# Patient Record
Sex: Female | Born: 1960 | Race: Black or African American | Hispanic: No | Marital: Married | Smoking: Never smoker
Health system: Southern US, Community
[De-identification: ages and names within clinical notes are randomized; demographics above are authoritative.]

## PROBLEM LIST (undated history)

## (undated) DIAGNOSIS — Z933 Colostomy status: Secondary | ICD-10-CM

## (undated) DIAGNOSIS — M5136 Other intervertebral disc degeneration, lumbar region: Secondary | ICD-10-CM

## (undated) DIAGNOSIS — R569 Unspecified convulsions: Secondary | ICD-10-CM

## (undated) DIAGNOSIS — G4733 Obstructive sleep apnea (adult) (pediatric): Secondary | ICD-10-CM

## (undated) DIAGNOSIS — K219 Gastro-esophageal reflux disease without esophagitis: Secondary | ICD-10-CM

## (undated) DIAGNOSIS — D8689 Sarcoidosis of other sites: Secondary | ICD-10-CM

## (undated) DIAGNOSIS — E119 Type 2 diabetes mellitus without complications: Secondary | ICD-10-CM

## (undated) DIAGNOSIS — M1990 Unspecified osteoarthritis, unspecified site: Secondary | ICD-10-CM

## (undated) DIAGNOSIS — R32 Unspecified urinary incontinence: Secondary | ICD-10-CM

## (undated) DIAGNOSIS — R7303 Prediabetes: Secondary | ICD-10-CM

## (undated) DIAGNOSIS — M51369 Other intervertebral disc degeneration, lumbar region without mention of lumbar back pain or lower extremity pain: Secondary | ICD-10-CM

## (undated) DIAGNOSIS — I10 Essential (primary) hypertension: Secondary | ICD-10-CM

## (undated) HISTORY — PX: ABDOMINAL HYSTERECTOMY: SHX81

## (undated) HISTORY — PX: BREAST SURGERY: SHX581

## (undated) HISTORY — DX: Prediabetes: R73.03

## (undated) HISTORY — DX: Essential (primary) hypertension: I10

## (undated) HISTORY — PX: BREAST EXCISIONAL BIOPSY: SUR124

---

## 1998-07-19 ENCOUNTER — Encounter: Payer: Self-pay | Admitting: General Surgery

## 1998-07-19 ENCOUNTER — Ambulatory Visit (HOSPITAL_COMMUNITY): Admission: RE | Admit: 1998-07-19 | Discharge: 1998-07-19 | Payer: Self-pay | Admitting: General Surgery

## 1999-04-04 ENCOUNTER — Other Ambulatory Visit: Admission: RE | Admit: 1999-04-04 | Discharge: 1999-04-04 | Payer: Self-pay | Admitting: Family Medicine

## 2000-06-29 ENCOUNTER — Encounter: Admission: RE | Admit: 2000-06-29 | Discharge: 2000-06-29 | Payer: Self-pay | Admitting: Family Medicine

## 2000-06-29 ENCOUNTER — Encounter: Payer: Self-pay | Admitting: Family Medicine

## 2000-07-02 ENCOUNTER — Encounter: Payer: Self-pay | Admitting: Family Medicine

## 2000-07-02 ENCOUNTER — Encounter: Admission: RE | Admit: 2000-07-02 | Discharge: 2000-07-02 | Payer: Self-pay | Admitting: Family Medicine

## 2000-10-01 ENCOUNTER — Encounter: Payer: Self-pay | Admitting: General Surgery

## 2000-10-01 ENCOUNTER — Ambulatory Visit (HOSPITAL_BASED_OUTPATIENT_CLINIC_OR_DEPARTMENT_OTHER): Admission: RE | Admit: 2000-10-01 | Discharge: 2000-10-01 | Payer: Self-pay | Admitting: General Surgery

## 2000-10-01 ENCOUNTER — Encounter (INDEPENDENT_AMBULATORY_CARE_PROVIDER_SITE_OTHER): Payer: Self-pay | Admitting: Specialist

## 2001-03-04 ENCOUNTER — Other Ambulatory Visit: Admission: RE | Admit: 2001-03-04 | Discharge: 2001-03-04 | Payer: Self-pay | Admitting: Family Medicine

## 2003-10-12 ENCOUNTER — Encounter: Admission: RE | Admit: 2003-10-12 | Discharge: 2003-10-12 | Payer: Self-pay | Admitting: Family Medicine

## 2006-02-23 ENCOUNTER — Encounter: Admission: RE | Admit: 2006-02-23 | Discharge: 2006-02-23 | Payer: Self-pay | Admitting: Family Medicine

## 2008-03-08 ENCOUNTER — Emergency Department (HOSPITAL_COMMUNITY): Admission: EM | Admit: 2008-03-08 | Discharge: 2008-03-08 | Payer: Self-pay | Admitting: Family Medicine

## 2008-04-12 ENCOUNTER — Encounter: Admission: RE | Admit: 2008-04-12 | Discharge: 2008-04-12 | Payer: Self-pay | Admitting: Family Medicine

## 2009-02-26 ENCOUNTER — Other Ambulatory Visit: Admission: RE | Admit: 2009-02-26 | Discharge: 2009-02-26 | Payer: Self-pay | Admitting: Family Medicine

## 2009-07-24 ENCOUNTER — Emergency Department (HOSPITAL_COMMUNITY): Admission: EM | Admit: 2009-07-24 | Discharge: 2009-07-24 | Payer: Self-pay | Admitting: Emergency Medicine

## 2010-03-07 ENCOUNTER — Encounter: Admission: RE | Admit: 2010-03-07 | Discharge: 2010-03-07 | Payer: Self-pay | Admitting: Family Medicine

## 2010-10-04 NOTE — Op Note (Signed)
[facility]  Patient:    Rhonda [name], Rhonda [name]                       MRN: [id number] Adm. Date:  [id number] Attending:  Arlis Porta CC:         Gretta Arab. Valentina Lucks, M.D.   Operative Report  PREOPERATIVE DIAGNOSIS:  Nonpalpable right breast mass.  POSTOPERATIVE DIAGNOSIS:  Nonpalpable right breast mass.  OPERATION:  Right breast biopsy after needle localization.  SURGEON:  Adolph Pollack, M.D.  ANESTHESIA:  General  INDICATION:  [name] is a 50 year old female who underwent a core needle biopsy of an abnormality in the right breast.  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  She underwent a mammogram this year and the lesion has grown. She now presents for localized excision of that lesion.  TECHNIQUE:  She underwent successful needle localization with a needle in the lateral aspect of the right breast.  She subsequently was brought to the operating room, placed supine on the operating table and given general anesthetic.  The right breast area was sterilely prepped and draped.  Plain 0.5% Marcaine was infiltrated in the lateral area and an incision was made laterally from 11 to 8 oclock.  Using sharp dissection and the cautery a cylindrical shaped mass of breast tissue was removed around the needle and sent for specimen mammogram.  The area of question was in the specimen mammogram.  The biopsy cavity was examined and the bleeding points controlled with the cautery.  Next, the subcutaneous fat was loosely approximated with an interrupted 3-0 Vicryl sutures.  The skin was closed with a 4-0 Monocryl subcuticular stitch, followed by Steri-Strips and sterile dressings.  She tolerated the procedure well without any apparent complications.  She was taken to the recovery room in satisfactory condition. DD:  10/01/00 TD:  10/02/00 Job: [id number]

## 2011-08-27 ENCOUNTER — Other Ambulatory Visit: Payer: Self-pay | Admitting: Family Medicine

## 2011-08-27 DIAGNOSIS — M545 Low back pain: Secondary | ICD-10-CM

## 2011-08-27 DIAGNOSIS — M5430 Sciatica, unspecified side: Secondary | ICD-10-CM

## 2011-08-30 ENCOUNTER — Ambulatory Visit
Admission: RE | Admit: 2011-08-30 | Discharge: 2011-08-30 | Disposition: A | Discharge status: Home / Self Care | Payer: BC Managed Care – PPO | Source: Ambulatory Visit | Attending: Family Medicine | Admitting: Family Medicine

## 2011-08-30 DIAGNOSIS — M5430 Sciatica, unspecified side: Secondary | ICD-10-CM

## 2011-08-30 DIAGNOSIS — M545 Low back pain: Secondary | ICD-10-CM

## 2011-08-30 MED ORDER — GADOBENATE DIMEGLUMINE 529 MG/ML IV SOLN
20.0000 mL | Freq: Once | INTRAVENOUS | Status: AC | PRN
  Administered 2011-08-30: 20 mL via INTRAVENOUS

## 2012-06-15 ENCOUNTER — Encounter (HOSPITAL_COMMUNITY): Payer: Self-pay

## 2012-06-15 ENCOUNTER — Emergency Department (HOSPITAL_COMMUNITY)
Admission: EM | Admit: 2012-06-15 | Discharge: 2012-06-15 | Disposition: A | Discharge status: Home / Self Care | Payer: Self-pay | Source: Home / Self Care | Attending: Family Medicine | Admitting: Family Medicine

## 2012-06-15 DIAGNOSIS — I10 Essential (primary) hypertension: Secondary | ICD-10-CM

## 2012-06-15 LAB — COMPREHENSIVE METABOLIC PANEL
ALT: 76 U/L — ABNORMAL HIGH (ref 0–35)
Albumin: 4 g/dL (ref 3.5–5.2)
Alkaline Phosphatase: 97 U/L (ref 39–117)
BUN: 10 mg/dL (ref 6–23)
GFR calc Af Amer: >90 mL/min (ref >90)
GFR calc non Af Amer: >90 mL/min (ref >90)
Glucose, Bld: 92 mg/dL (ref 70–99)
Potassium: 3.9 mEq/L (ref 3.5–5.1)
Sodium: 141 mEq/L (ref 135–145)
Total Bilirubin: 0.3 mg/dL (ref 0.3–1.2)

## 2012-06-15 LAB — LIPID PANEL
Cholesterol: 229 mg/dL — ABNORMAL HIGH (ref 0–200)
Triglycerides: 131 mg/dL (ref <150)

## 2012-06-15 MED ORDER — LISINOPRIL-HYDROCHLOROTHIAZIDE 10-12.5 MG PO TABS: 1.0000 | ORAL_TABLET | Freq: Every day | ORAL | Status: DC

## 2012-06-15 NOTE — ED Notes (Signed)
Authorization for release of records faxed to Dr. Doristine Locks @[phone]

## 2012-06-15 NOTE — ED Provider Notes (Signed)
History     CSN: [id number]  Arrival date & time 06/15/12  1038   First MD Initiated Contact with Patient 06/15/12 1050     Chief Complaint  Patient presents with  . Hypertension   HPI Pt reports that she has been out of her BP meds for many months.  She lost her job and insurance and has not been able to follow up.  She is trying to get a new job but was told that she had to get her BP under better control before she would be able to start her new job. She says that she has been having headaches but no visual changes.  Pt says that she was taking some OTC cough and cold medications over the last week.  She would like to go ahead and get medications for her BP and says that she will take them.    History reviewed. No pertinent past medical history.  Pt says she has history of sciatica.    Past Surgical History  Procedure Date  . Abdominal hysterectomy    No family history on file.  History  Substance Use Topics  . Smoking status: Never Smoker   . Smokeless tobacco: Not on file  . Alcohol Use: No    OB History    Grav Para Term Preterm Abortions TAB SAB Ect Mult Living                 Review of Systems  HENT: Positive for congestion.   Musculoskeletal: Positive for arthralgias.  All other systems reviewed and are negative.   Allergies  Aspirin and Augmentin  Home Medications  No current outpatient prescriptions on file.  BP 159/89  Pulse 87  Temp 98.6 F (37 C) (Oral)  Resp 16  SpO2 100%  Physical Exam  Nursing note and vitals reviewed. Constitutional: She is oriented to person, place, and time. She appears well-developed and well-nourished.  HENT:  Head: Normocephalic and atraumatic.  Eyes: Conjunctivae normal and EOM are normal. Pupils are equal, round, and reactive to light.  Neck: Normal range of motion. Neck supple.  Cardiovascular: Normal rate, regular rhythm and normal heart sounds.   Pulmonary/Chest: Effort normal and breath sounds normal.    Abdominal: Soft. Bowel sounds are normal.  Musculoskeletal: Normal range of motion.  Neurological: She is alert and oriented to person, place, and time. She has normal reflexes.  Skin: Skin is warm and dry.  Psychiatric: She has a normal mood and affect. Her behavior is normal. Judgment and thought content normal.   ED Course  Procedures (including critical care time)  Labs Reviewed - No data to display No results found.  No diagnosis found.  MDM  IMPRESSION  Hypertension, uncontrolled   RECOMMENDATIONS / PLAN Plan to start lisinopril HCTZ 10/12.5 mg po daily Check BP regularly at home and report home BP readings in 1-2 weeks  FOLLOW UP 1 month for BP check  The patient was given clear instructions to go to ER or return to medical center if symptoms don't improve, worsen or new problems develop.  The patient verbalized understanding.  The patient was told to call to get lab results if they haven't heard anything in the next week.            Cleora Fleet, MD 06/15/12 [phone]

## 2012-06-15 NOTE — ED Notes (Signed)
Patient states went for a physical today and was told her blood pressure was elevated (160/100) and was instructed to follow up here for medication refill

## 2012-06-16 NOTE — Progress Notes (Signed)
Quick Note:  Please notify patient that her labs came back OK but her liver enzymes came back a little elevated. We don't have any old labs to compare to. Her cholesterol was a little elevated. Recommend low fat low cholesterol diet. Recheck labs in 3 months.    Rodney Langton, MD, CDE, FAAFP [facility] [employer], [place]   ______

## 2012-06-21 ENCOUNTER — Telehealth (HOSPITAL_COMMUNITY): Payer: Self-pay

## 2012-06-21 NOTE — Telephone Encounter (Signed)
Message copied by Lestine Mount on Mon Jun 21, 2012  9:57 AM ------      Message from: Cleora Fleet      Created: Wed Jun 16, 2012  9:55 AM       Please notify patient that her labs came back OK but her liver enzymes came back a little elevated.  We don't have any old labs to compare to.  Her cholesterol was a little elevated.  Recommend low fat low cholesterol diet.   Recheck labs in 3 months.                    Rodney Langton, MD, CDE, FAAFP      [facility]      [facility]      [place], [place]

## 2012-07-15 NOTE — ED Notes (Signed)
BLOOD PRESSURE CHECK ONLY

## 2012-10-10 ENCOUNTER — Other Ambulatory Visit (HOSPITAL_COMMUNITY): Payer: Self-pay | Admitting: Family Medicine

## 2012-10-12 NOTE — Telephone Encounter (Signed)
Patient needs to be seen Please make appt

## 2014-02-05 ENCOUNTER — Emergency Department (INDEPENDENT_AMBULATORY_CARE_PROVIDER_SITE_OTHER): Payer: Self-pay

## 2014-02-05 ENCOUNTER — Encounter (HOSPITAL_COMMUNITY): Payer: Self-pay | Admitting: Emergency Medicine

## 2014-02-05 ENCOUNTER — Emergency Department (INDEPENDENT_AMBULATORY_CARE_PROVIDER_SITE_OTHER)
Admission: EM | Admit: 2014-02-05 | Discharge: 2014-02-05 | Disposition: A | Discharge status: Home / Self Care | Payer: Self-pay | Source: Home / Self Care | Attending: Family Medicine | Admitting: Family Medicine

## 2014-02-05 DIAGNOSIS — S9030XA Contusion of unspecified foot, initial encounter: Secondary | ICD-10-CM

## 2014-02-05 DIAGNOSIS — S9031XA Contusion of right foot, initial encounter: Secondary | ICD-10-CM

## 2014-02-05 MED ORDER — TRAMADOL HCL 50 MG PO TABS: 50.0000 mg | ORAL_TABLET | Freq: Four times a day (QID) | ORAL | Status: DC | PRN

## 2014-02-05 NOTE — ED Notes (Signed)
Patient states was hanging blinds yesterday and lost her Balance and fell injuring her right ankle

## 2014-02-05 NOTE — Discharge Instructions (Signed)
Thank you for coming in today. Use tramadol for severe pain. Do not take and drive.  Contusion A contusion is the result of an injury to the skin and underlying tissues and is usually caused by direct trauma. The injury results in the appearance of a bruise on the skin overlying the injured tissues. Contusions cause rupture and bleeding of the small capillaries and blood vessels and affect function, because the bleeding infiltrates muscles, tendons, nerves, or other soft tissues.  SYMPTOMS   Swelling and often a hard lump in the injured area, either superficial or deep.  Pain and tenderness over the area of the contusion.  Feeling of firmness when pressure is exerted over the contusion.  Discoloration under the skin, beginning with redness and progressing to the characteristic "black and blue" bruise. CAUSES  A contusion is typically the result of direct trauma. This is often by a blunt object.  RISK INCREASES WITH:  Sports that have a high likelihood of trauma (football, boxing, ice hockey, soccer, field hockey, martial arts, basketball, and baseball).  Sports that make falling from a height likely (high-jumping, pole-vaulting, skating, or gymnastics).  Any bleeding disorder (hemophilia) or taking medications that affect clotting (aspirin, nonsteroidal anti-inflammatory medications, or warfarin [Coumadin]).  Inadequate protection of exposed areas during contact sports. PREVENTION  Maintain physical fitness:  Joint and muscle flexibility.  Strength and endurance.  Coordination.  Wear proper protective equipment. Make sure it fits correctly. PROGNOSIS  Contusions typically heal without any complications. Healing time varies with the severity of injury and intake of medications that affect clotting. Contusions usually heal in 1 to 4 weeks. RELATED COMPLICATIONS   Damage to nearby nerves or blood vessels, causing numbness, coldness, or paleness.  Compartment  syndrome.  Bleeding into the soft tissues that leads to disability.  Infiltrative-type bleeding, leading to the calcification and impaired function of the injured muscle (rare).  Prolonged healing time if usual activities are resumed too soon.  Infection if the skin over the injury site is broken.  Fracture of the bone underlying the contusion.  Stiffness in the joint where the injured muscle crosses. TREATMENT  Treatment initially consists of resting the injured area as well as medication and ice to reduce inflammation. The use of a compression bandage may also be helpful in minimizing inflammation. As pain diminishes and movement is tolerated, the joint where the affected muscle crosses should be moved to prevent stiffness and the shortening (contracture) of the joint. Movement of the joint should begin as soon as possible. It is also important to work on maintaining strength within the affected muscles. Occasionally, extra padding over the area of contusion may be recommended before returning to sports, particularly if re-injury is likely.  MEDICATION   If pain relief is necessary these medications are often recommended:  Nonsteroidal anti-inflammatory medications, such as aspirin and ibuprofen.  Other minor pain relievers, such as acetaminophen, are often recommended.  Prescription pain relievers may be given by your caregiver. Use only as directed and only as much as you need. HEAT AND COLD  Cold treatment (icing) relieves pain and reduces inflammation. Cold treatment should be applied for 10 to 15 minutes every 2 to 3 hours for inflammation and pain and immediately after any activity that aggravates your symptoms. Use ice packs or an ice massage. (To do an ice massage fill a large styrofoam cup with water and freeze. Tear a small amount of foam from the top so ice protrudes. Massage ice firmly over the injured area  in a circle about the size of a softball.)  Heat treatment may be  used prior to performing the stretching and strengthening activities prescribed by your caregiver, physical therapist, or athletic trainer. Use a heat pack or a warm soak. SEEK MEDICAL CARE IF:   Symptoms get worse or do not improve despite treatment in a few days.  You have difficulty moving a joint.  Any extremity becomes extremely painful, numb, pale, or cool (This is an emergency!).  Medication produces any side effects (bleeding, upset stomach, or allergic reaction).  Signs of infection (drainage from skin, headache, muscle aches, dizziness, fever, or general ill feeling) occur if skin was broken. Document Released: 05/05/2005 Document Revised: 07/28/2011 Document Reviewed: 08/17/2008 [facility] Patient Information 2015 [place], [place]. This information is not intended to replace advice given to you by your health care provider. Make sure you discuss any questions you have with your health care provider.

## 2014-02-05 NOTE — ED Provider Notes (Signed)
Rhonda [name] is a 53 y.o. female who presents to Urgent Care today for right ankle and heel injury. Patient was at home attempting to hang blinds yesterday when she fell approximately 3 feet to the floor and landed on her right foot.  She notes medial ankle pain and plantar calcaneus pain. She notes the pain is worse with ambulation and better with rest. She denies any radiating pain weakness or numbness. She has not tried any medications yet. Symptoms are moderate.   History reviewed. No pertinent past medical history. History  Substance Use Topics  . Smoking status: Never Smoker   . Smokeless tobacco: Not on file  . Alcohol Use: No   ROS as above Medications: No current facility-administered medications for this encounter.   Current Outpatient Prescriptions  Medication Sig Dispense Refill  . lisinopril-hydrochlorothiazide (ZESTORETIC) 10-12.5 MG per tablet Take 1 tablet by mouth daily.  30 tablet  3  . traMADol (ULTRAM) 50 MG tablet Take 1 tablet (50 mg total) by mouth every 6 (six) hours as needed.  15 tablet  0    Exam:  BP 152/94  Pulse 86  Temp(Src) 98.9 F (37.2 C) (Oral)  Resp 16  SpO2 99% Gen: Well NAD Right leg:  Knee normal-appearing nontender full motion Ankle: No ecchymosis or effusion. Tender palpation at the medial malleolus. Motion is intact stable ligamentous exam. Pulses Refill sensation intact. Foot: Tender palpation plantar calcaneus. Nontender otherwise Antalgic gait   No results found for this or any previous visit (from the past 24 hour(s)). Dg Ankle Complete Right  02/05/2014   CLINICAL DATA:  Status post fall 1 day ago with medial right ankle and heel pain  EXAM: RIGHT ANKLE - COMPLETE 3+ VIEW  COMPARISON:  None.  FINDINGS: The ankle joint mortise is preserved. The talar dome is intact. There is no acute malleolar fracture. There are plantar and Achilles region calcaneal spurs. The other tarsal bones and the metatarsal bases are normal where visualized.   IMPRESSION: There is no acute bony abnormality of the right ankle.   Electronically Signed   By: David  Martinique   On: 02/05/2014 10:18   Dg Os Calcis Right  02/05/2014   CLINICAL DATA:  Medial right ankle and right heel pain following a fall 1 day ago.  EXAM: RIGHT OS CALCIS - 2+ VIEW  COMPARISON:  Right ankle obtained at the same time.  FINDINGS: Moderately large inferior and posterior calcaneal spurs. Mild to moderate anterior talotibial spur formation. No fractures seen.  IMPRESSION: No fracture.  Spurs, as described above.   Electronically Signed   By: Enrique Sack M.D.   On: 02/05/2014 10:18    Assessment and Plan: 53 y.o. female with ankle and heel contusion. Plan to treat with tramadol ASO brace and crutches. Work note provided.  Discussed warning signs or symptoms. Please see discharge instructions. Patient expresses understanding.     Gregor Hams, MD 02/05/14 1051

## 2014-08-04 ENCOUNTER — Ambulatory Visit: Payer: Self-pay | Attending: Internal Medicine | Admitting: Internal Medicine

## 2014-08-04 ENCOUNTER — Encounter: Payer: Self-pay | Admitting: Internal Medicine

## 2014-08-04 VITALS — BP 160/90 | HR 84 | Temp 98.0°F | Resp 16 | Wt 221.0 lb

## 2014-08-04 DIAGNOSIS — IMO0001 Reserved for inherently not codable concepts without codable children: Secondary | ICD-10-CM

## 2014-08-04 DIAGNOSIS — I10 Essential (primary) hypertension: Secondary | ICD-10-CM | POA: Insufficient documentation

## 2014-08-04 DIAGNOSIS — Z1231 Encounter for screening mammogram for malignant neoplasm of breast: Secondary | ICD-10-CM

## 2014-08-04 DIAGNOSIS — G8929 Other chronic pain: Secondary | ICD-10-CM | POA: Insufficient documentation

## 2014-08-04 DIAGNOSIS — Z139 Encounter for screening, unspecified: Secondary | ICD-10-CM | POA: Insufficient documentation

## 2014-08-04 DIAGNOSIS — M545 Low back pain, unspecified: Secondary | ICD-10-CM

## 2014-08-04 DIAGNOSIS — R030 Elevated blood-pressure reading, without diagnosis of hypertension: Secondary | ICD-10-CM

## 2014-08-04 DIAGNOSIS — Z1211 Encounter for screening for malignant neoplasm of colon: Secondary | ICD-10-CM

## 2014-08-04 DIAGNOSIS — M48061 Spinal stenosis, lumbar region without neurogenic claudication: Secondary | ICD-10-CM

## 2014-08-04 DIAGNOSIS — M4806 Spinal stenosis, lumbar region: Secondary | ICD-10-CM | POA: Insufficient documentation

## 2014-08-04 LAB — CBC WITH DIFFERENTIAL/PLATELET
BASOS ABS: 0 10*3/uL (ref 0.0–0.1)
BASOS PCT: 0 % (ref 0–1)
Eosinophils Absolute: 0.1 10*3/uL (ref 0.0–0.7)
Eosinophils Relative: 1 % (ref 0–5)
HCT: 39.3 % (ref 36.0–46.0)
Hemoglobin: 13 g/dL (ref 12.0–15.0)
LYMPHS ABS: 3.3 10*3/uL (ref 0.7–4.0)
LYMPHS PCT: 34 % (ref 12–46)
MCH: 26.6 pg (ref 26.0–34.0)
MCHC: 33.1 g/dL (ref 30.0–36.0)
MCV: 80.5 fL (ref 78.0–100.0)
MPV: 10.3 fL (ref 8.6–12.4)
Monocytes Absolute: 0.7 10*3/uL (ref 0.1–1.0)
Monocytes Relative: 7 % (ref 3–12)
NEUTROS ABS: 5.7 10*3/uL (ref 1.7–7.7)
NEUTROS PCT: 58 % (ref 43–77)
PLATELETS: 318 10*3/uL (ref 150–400)
RBC: 4.88 MIL/uL (ref 3.87–5.11)
RDW: 15.7 % — ABNORMAL HIGH (ref 11.5–15.5)
WBC: 9.8 10*3/uL (ref 4.0–10.5)

## 2014-08-04 MED ORDER — LISINOPRIL-HYDROCHLOROTHIAZIDE 10-12.5 MG PO TABS: 1.0000 | ORAL_TABLET | Freq: Every day | ORAL | Status: DC

## 2014-08-04 MED ORDER — CLONIDINE HCL 0.1 MG PO TABS
0.1000 mg | ORAL_TABLET | Freq: Once | ORAL | Status: AC
  Administered 2014-08-04: 0.1 mg via ORAL

## 2014-08-04 MED ORDER — GABAPENTIN 300 MG PO CAPS: 300.0000 mg | ORAL_CAPSULE | Freq: Three times a day (TID) | ORAL | Status: DC

## 2014-08-04 MED ORDER — TRAMADOL HCL 50 MG PO TABS: 50.0000 mg | ORAL_TABLET | Freq: Four times a day (QID) | ORAL | Status: DC | PRN

## 2014-08-04 NOTE — Progress Notes (Signed)
Patient Demographics  Rhonda [name], is a 54 y.o. female  HEN:[id number]  TIR:[id number]  DOB - 12/20/1960  CC:  Chief Complaint  Patient presents with  . Back Pain       HPI: Rhonda [name] is a 54 y.o. female here today to establish medical care.Patient has history of hypertension, chronic lower back pain, she had MRI done in 2013 which reportedMild to moderate central canal stenosis at L4-5 due to thefacet disease and a broad-based disc herniation. Lateral recessnarrowing is worse on the left.3.  Mild foraminal narrowing bilaterally at L4-5.4.  Mild central and bilateral foraminal narrowing at L5-S1 due tobroad-based disc herniation, right greater than left. Today her blood pressure is elevated, as per patient she has not been taking her medication for several months, she was clonidine and her repeat manual blood pressure is 160/90, denies any headache dizziness chest and shortness of breath, as per patient her back pain is getting worse but denies any fever chills any incontinence, she is requesting something medication, has been trying over-the-counter ibuprofen/ alleve  Patient has No headache, No chest pain, No abdominal pain - No Nausea, No new weakness tingling or numbness, No Cough - SOB.  Allergies  Allergen Reactions  . Aspirin   . Augmentin [Amoxicillin-Pot Clavulanate]    Past Medical History  Diagnosis Date  . Hypertension    No current outpatient prescriptions on file prior to visit.   No current facility-administered medications on file prior to visit.   Family History  Problem Relation Age of Onset  . Cancer Mother   . Hypertension Sister   . Diabetes Sister   . Diabetes Maternal Grandmother    History   Social History  . Marital Status: Married    Spouse Name: N/A  . Number of Children: N/A  . Years of Education: N/A   Occupational History  . Not on file.   Social History Main Topics  . Smoking status: Never Smoker   . Smokeless tobacco: Not  on file  . Alcohol Use: No  . Drug Use: Not on file  . Sexual Activity: Not on file   Other Topics Concern  . Not on file   Social History Narrative    Review of Systems: Constitutional: Negative for fever, chills, diaphoresis, activity change, appetite change and fatigue. HENT: Negative for ear pain, nosebleeds, congestion, facial swelling, rhinorrhea, neck pain, neck stiffness and ear discharge.  Eyes: Negative for pain, discharge, redness, itching and visual disturbance. Respiratory: Negative for cough, choking, chest tightness, shortness of breath, wheezing and stridor.  Cardiovascular: Negative for chest pain, palpitations and leg swelling. Gastrointestinal: Negative for abdominal distention. Genitourinary: Negative for dysuria, urgency, frequency, hematuria, flank pain, decreased urine volume, difficulty urinating and dyspareunia.  Musculoskeletal: Negative for back pain, joint swelling, arthralgia and gait problem. Neurological: Negative for dizziness, tremors, seizures, syncope, facial asymmetry, speech difficulty, weakness, light-headedness, numbness and headaches.  Hematological: Negative for adenopathy. Does not bruise/bleed easily. Psychiatric/Behavioral: Negative for hallucinations, behavioral problems, confusion, dysphoric mood, decreased concentration and agitation.    Objective:   Filed Vitals:   08/04/14 1433  BP: 160/90  Pulse:   Temp:   Resp:     Physical Exam: Constitutional: Patient appears well-developed and well-nourished. No distress. HENT: Normocephalic, atraumatic, External right and left ear normal. Oropharynx is clear and moist.  Eyes: Conjunctivae and EOM are normal. PERRLA, no scleral icterus. Neck: Normal ROM. Neck supple. No JVD. No tracheal deviation. No thyromegaly. CVS: RRR, S1/S2 +, no  murmurs, no gallops, no carotid bruit.  Pulmonary: Effort and breath sounds normal, no stridor, rhonchi, wheezes, rales.  Abdominal: Soft. BS +, no  distension, tenderness, rebound or guarding.  Musculoskeletal: Normal range of motion. Lower lumbar spinal tenderness, SLR positive. Neuro: Alert. Normal reflexes, muscle tone coordination. No cranial nerve deficit. Skin: Skin is warm and dry. No rash noted. Not diaphoretic. No erythema. No pallor. Psychiatric: Normal mood and affect. Behavior, judgment, thought content normal.  No results found for: WBC, HGB, HCT, MCV, PLT Lab Results  Component Value Date   CREATININE 0.77 06/15/2012   BUN 10 06/15/2012   NA 141 06/15/2012   K 3.9 06/15/2012   CL 102 06/15/2012   CO2 28 06/15/2012    No results found for: HGBA1C Lipid Panel     Component Value Date/Time   CHOL 229* 06/15/2012 1124   TRIG 131 06/15/2012 1124   HDL 54 06/15/2012 1124   CHOLHDL 4.2 06/15/2012 1124   VLDL 26 06/15/2012 1124   LDLCALC 149* 06/15/2012 1124       Assessment and plan:   1. Essential hypertension Advised patient for DASH diet, resume back on lisinopril/hydrochlorothiazide, patient will come back in 2 weeks for BP check. - lisinopril-hydrochlorothiazide (ZESTORETIC) 10-12.5 MG per tablet; Take 1 tablet by mouth daily.  Dispense: 30 tablet; Refill: 3 - COMPLETE METABOLIC PANEL WITH GFR  2. Elevated blood pressure  - cloNIDine (CATAPRES) tablet 0.1 mg; Take 1 tablet (0.1 mg total) by mouth once.  3. Spinal stenosis of lumbar region  - Ambulatory referral to Orthopedic Surgery  4. Chronic low back pain  - Ambulatory referral to Orthopedic Surgery - gabapentin (NEURONTIN) 300 MG capsule; Take 1 capsule (300 mg total) by mouth 3 (three) times daily.  Dispense: 90 capsule; Refill: 3 - traMADol (ULTRAM) 50 MG tablet; Take 1 tablet (50 mg total) by mouth every 6 (six) hours as needed.  Dispense: 20 tablet; Refill: 0  5. Encounter for screening mammogram for breast cancer  - MM DIGITAL SCREENING BILATERAL; Future  6. Special screening for malignant neoplasms, colon  - Ambulatory referral to  Gastroenterology  7. Screening Ordered baseline blood work. - CBC with Differential/Platelet - TSH - Vit D  25 hydroxy (rtn osteoporosis monitoring) - Hemoglobin A1c        Health Maintenance -Colonoscopy:referred to GI  -Mammogram:ordered   Return in about 3 months (around 11/04/2014) for hypertension, BP check in 2 weeks/Nurse Visit.    The patient was given clear instructions to go to ER or return to medical center if symptoms don't improve, worsen or new problems develop. The patient verbalized understanding. The patient was told to call to get lab results if they haven't heard anything in the next week.    This note has been created with Surveyor, quantity. Any transcriptional errors are unintentional.   Lorayne Marek, MD

## 2014-08-04 NOTE — Patient Instructions (Signed)
DASH Eating Plan °DASH stands for "Dietary Approaches to Stop Hypertension." The DASH eating plan is a healthy eating plan that has been shown to reduce high blood pressure (hypertension). Additional health benefits may include reducing the risk of type 2 diabetes mellitus, heart disease, and stroke. The DASH eating plan may also help with weight loss. °WHAT DO I NEED TO KNOW ABOUT THE DASH EATING PLAN? °For the DASH eating plan, you will follow these general guidelines: °· Choose foods with a percent daily value for sodium of less than 5% (as listed on the food label). °· Use salt-free seasonings or herbs instead of table salt or sea salt. °· Check with your health care provider or pharmacist before using salt substitutes. °· Eat lower-sodium products, often labeled as "lower sodium" or "no salt added." °· Eat fresh foods. °· Eat more vegetables, fruits, and low-fat dairy products. °· Choose whole grains. Look for the word "whole" as the first word in the ingredient list. °· Choose fish and skinless chicken or turkey more often than red meat. Limit fish, poultry, and meat to 6 oz (170 g) each day. °· Limit sweets, desserts, sugars, and sugary drinks. °· Choose heart-healthy fats. °· Limit cheese to 1 oz (28 g) per day. °· Eat more home-cooked food and less restaurant, buffet, and fast food. °· Limit fried foods. °· Cook foods using methods other than frying. °· Limit canned vegetables. If you do use them, rinse them well to decrease the sodium. °· When eating at a restaurant, ask that your food be prepared with less salt, or no salt if possible. °WHAT FOODS CAN I EAT? °Seek help from a dietitian for individual calorie needs. °Grains °Whole grain or whole wheat bread. Brown rice. Whole grain or whole wheat pasta. Quinoa, bulgur, and whole grain cereals. Low-sodium cereals. Corn or whole wheat flour tortillas. Whole grain cornbread. Whole grain crackers. Low-sodium crackers. °Vegetables °Fresh or frozen vegetables  (raw, steamed, roasted, or grilled). Low-sodium or reduced-sodium tomato and vegetable juices. Low-sodium or reduced-sodium tomato sauce and paste. Low-sodium or reduced-sodium canned vegetables.  °Fruits °All fresh, canned (in natural juice), or frozen fruits. °Meat and Other Protein Products °Ground beef (85% or leaner), grass-fed beef, or beef trimmed of fat. Skinless chicken or turkey. Ground chicken or turkey. Pork trimmed of fat. All fish and seafood. Eggs. Dried beans, peas, or lentils. Unsalted nuts and seeds. Unsalted canned beans. °Dairy °Low-fat dairy products, such as skim or 1% milk, 2% or reduced-fat cheeses, low-fat ricotta or cottage cheese, or plain low-fat yogurt. Low-sodium or reduced-sodium cheeses. °Fats and Oils °Tub margarines without trans fats. Light or reduced-fat mayonnaise and salad dressings (reduced sodium). Avocado. Safflower, olive, or canola oils. Natural peanut or almond butter. °Other °Unsalted popcorn and pretzels. °The items listed above may not be a complete list of recommended foods or beverages. Contact your dietitian for more options. °WHAT FOODS ARE NOT RECOMMENDED? °Grains °White bread. White pasta. White rice. Refined cornbread. Bagels and croissants. Crackers that contain trans fat. °Vegetables °Creamed or fried vegetables. Vegetables in a cheese sauce. Regular canned vegetables. Regular canned tomato sauce and paste. Regular tomato and vegetable juices. °Fruits °Dried fruits. Canned fruit in light or heavy syrup. Fruit juice. °Meat and Other Protein Products °Fatty cuts of meat. Ribs, chicken wings, bacon, sausage, bologna, salami, chitterlings, fatback, hot dogs, bratwurst, and packaged luncheon meats. Salted nuts and seeds. Canned beans with salt. °Dairy °Whole or 2% milk, cream, half-and-half, and cream cheese. Whole-fat or sweetened yogurt. Full-fat   cheeses or blue cheese. Nondairy creamers and whipped toppings. Processed cheese, cheese spreads, or cheese  curds. °Condiments °Onion and garlic salt, seasoned salt, table salt, and sea salt. Canned and packaged gravies. Worcestershire sauce. Tartar sauce. Barbecue sauce. Teriyaki sauce. Soy sauce, including reduced sodium. Steak sauce. Fish sauce. Oyster sauce. Cocktail sauce. Horseradish. Ketchup and mustard. Meat flavorings and tenderizers. Bouillon cubes. Hot sauce. Tabasco sauce. Marinades. Taco seasonings. Relishes. °Fats and Oils °Butter, stick margarine, lard, shortening, ghee, and bacon fat. Coconut, palm kernel, or palm oils. Regular salad dressings. °Other °Pickles and olives. Salted popcorn and pretzels. °The items listed above may not be a complete list of foods and beverages to avoid. Contact your dietitian for more information. °WHERE CAN I FIND MORE INFORMATION? °National Heart, Lung, and Blood Institute: www.nhlbi.nih.gov/health/health-topics/topics/dash/ °Document Released: 04/24/2011 Document Revised: 09/19/2013 Document Reviewed: 03/09/2013 °ExitCare® Patient Information ©2015 [employer]. This information is not intended to replace advice given to you by your health care provider. Make sure you discuss any questions you have with your health care provider. ° °

## 2014-08-04 NOTE — Progress Notes (Signed)
Patient complains of back pain that radiates down her legs Presents in office with elevated blood pressure States she can not remember the last time she took her blood pressure  medication

## 2014-08-05 LAB — COMPLETE METABOLIC PANEL WITH GFR
ALBUMIN: 3.9 g/dL (ref 3.5–5.2)
ALT: 30 U/L (ref 0–35)
AST: 39 U/L — ABNORMAL HIGH (ref 0–37)
Alkaline Phosphatase: 78 U/L (ref 39–117)
BILIRUBIN TOTAL: 0.3 mg/dL (ref 0.2–1.2)
BUN: 15 mg/dL (ref 6–23)
CALCIUM: 9.7 mg/dL (ref 8.4–10.5)
CHLORIDE: 103 meq/L (ref 96–112)
CO2: 25 meq/L (ref 19–32)
Creat: 0.9 mg/dL (ref 0.50–1.10)
GFR, Est African American: 84 mL/min
GFR, Est Non African American: 73 mL/min
GLUCOSE: 84 mg/dL (ref 70–99)
POTASSIUM: 4.2 meq/L (ref 3.5–5.3)
Sodium: 140 mEq/L (ref 135–145)
Total Protein: 6.9 g/dL (ref 6.0–8.3)

## 2014-08-05 LAB — HEMOGLOBIN A1C
HEMOGLOBIN A1C: 6.3 % — AB (ref <5.7)
MEAN PLASMA GLUCOSE: 134 mg/dL — AB (ref <117)

## 2014-08-05 LAB — VITAMIN D 25 HYDROXY (VIT D DEFICIENCY, FRACTURES): VIT D 25 HYDROXY: 29 ng/mL — AB (ref 30–100)

## 2014-08-05 LAB — TSH: TSH: 2.87 u[IU]/mL (ref 0.350–4.500)

## 2014-08-08 ENCOUNTER — Telehealth: Payer: Self-pay

## 2014-08-08 NOTE — Telephone Encounter (Signed)
-----   Message from Lorayne Marek, MD sent at 08/07/2014  9:21 AM EDT ----- Blood work reviewed noticed hemoglobin A1c of 6.3%, patient has prediabetes, call and advise patient for low carbohydrate diet. , noticed borderline low vitamin D, advise patient to start taking OTC 2000 units daily.

## 2014-08-08 NOTE — Telephone Encounter (Signed)
Patient is aware of her lab results 

## 2014-08-25 ENCOUNTER — Ambulatory Visit: Payer: Self-pay | Attending: Internal Medicine | Admitting: *Deleted

## 2014-08-25 VITALS — BP 122/83 | HR 92 | Temp 98.6°F | Resp 16 | Ht 63.0 in | Wt 218.0 lb

## 2014-08-25 DIAGNOSIS — I10 Essential (primary) hypertension: Secondary | ICD-10-CM | POA: Insufficient documentation

## 2014-08-25 DIAGNOSIS — Z0130 Encounter for examination of blood pressure without abnormal findings: Secondary | ICD-10-CM

## 2014-08-25 NOTE — Patient Instructions (Signed)
Diabetes Mellitus and Food It is important for you to manage your blood sugar (glucose) level. Your blood glucose level can be greatly affected by what you eat. Eating healthier foods in the appropriate amounts throughout the day at about the same time each day will help you control your blood glucose level. It can also help slow or prevent worsening of your diabetes mellitus. Healthy eating may even help you improve the level of your blood pressure and reach or maintain a healthy weight.  HOW CAN FOOD AFFECT ME? Carbohydrates Carbohydrates affect your blood glucose level more than any other type of food. Your dietitian will help you determine how many carbohydrates to eat at each meal and teach you how to count carbohydrates. Counting carbohydrates is important to keep your blood glucose at a healthy level, especially if you are using insulin or taking certain medicines for diabetes mellitus. Alcohol Alcohol can cause sudden decreases in blood glucose (hypoglycemia), especially if you use insulin or take certain medicines for diabetes mellitus. Hypoglycemia can be a life-threatening condition. Symptoms of hypoglycemia (sleepiness, dizziness, and disorientation) are similar to symptoms of having too much alcohol.  If your health care provider has given you approval to drink alcohol, do so in moderation and use the following guidelines:  Women should not have more than one drink per day, and men should not have more than two drinks per day. One drink is equal to:  12 oz of beer.  5 oz of wine.  1 oz of hard liquor.  Do not drink on an empty stomach.  Keep yourself hydrated. Have water, diet soda, or unsweetened iced tea.  Regular soda, juice, and other mixers might contain a lot of carbohydrates and should be counted. WHAT FOODS ARE NOT RECOMMENDED? As you make food choices, it is important to remember that all foods are not the same. Some foods have fewer nutrients per serving than other  foods, even though they might have the same number of calories or carbohydrates. It is difficult to get your body what it needs when you eat foods with fewer nutrients. Examples of foods that you should avoid that are high in calories and carbohydrates but low in nutrients include:  Trans fats (most processed foods list trans fats on the Nutrition Facts label).  Regular soda.  Juice.  Candy.  Sweets, such as cake, pie, doughnuts, and cookies.  Fried foods. WHAT FOODS CAN I EAT? Have nutrient-rich foods, which will nourish your body and keep you healthy. The food you should eat also will depend on several factors, including:  The calories you need.  The medicines you take.  Your weight.  Your blood glucose level.  Your blood pressure level.  Your cholesterol level. You also should eat a variety of foods, including:  Protein, such as meat, poultry, fish, tofu, nuts, and seeds (lean animal proteins are best).  Fruits.  Vegetables.  Dairy products, such as milk, cheese, and yogurt (low fat is best).  Breads, grains, pasta, cereal, rice, and beans.  Fats such as olive oil, trans fat-free margarine, canola oil, avocado, and olives. DOES EVERYONE WITH DIABETES MELLITUS HAVE THE SAME MEAL PLAN? Because every person with diabetes mellitus is different, there is not one meal plan that works for everyone. It is very important that you meet with a dietitian who will help you create a meal plan that is just right for you. Document Released: 01/30/2005 Document Revised: 05/10/2013 Document Reviewed: 04/01/2013 ExitCare Patient Information 2015 [employer]. This   information is not intended to replace advice given to you by your health care provider. Make sure you discuss any questions you have with your health care provider. Diabetes and Exercise Exercising regularly is important. It is not just about losing weight. It has many health benefits, such as:  Improving your overall  fitness, flexibility, and endurance.  Increasing your bone density.  Helping with weight control.  Decreasing your body fat.  Increasing your muscle strength.  Reducing stress and tension.  Improving your overall health. People with diabetes who exercise gain additional benefits because exercise:  Reduces appetite.  Improves the body's use of blood sugar (glucose).  Helps lower or control blood glucose.  Decreases blood pressure.  Helps control blood lipids (such as cholesterol and triglycerides).  Improves the body's use of the hormone insulin by:  Increasing the body's insulin sensitivity.  Reducing the body's insulin needs.  Decreases the risk for heart disease because exercising:  Lowers cholesterol and triglycerides levels.  Increases the levels of good cholesterol (such as high-density lipoproteins [HDL]) in the body.  Lowers blood glucose levels. YOUR ACTIVITY PLAN  Choose an activity that you enjoy and set realistic goals. Your health care provider or diabetes educator can help you make an activity plan that works for you. Exercise regularly as directed by your health care provider. This includes:  Performing resistance training twice a week such as push-ups, sit-ups, lifting weights, or using resistance bands.  Performing 150 minutes of cardio exercises each week such as walking, running, or playing sports.  Staying active and spending no more than 90 minutes at one time being inactive. Even short bursts of exercise are good for you. Three 10-minute sessions spread throughout the day are just as beneficial as a single 30-minute session. Some exercise ideas include:  Taking the dog for a walk.  Taking the stairs instead of the elevator.  Dancing to your favorite song.  Doing an exercise video.  Doing your favorite exercise with a friend. RECOMMENDATIONS FOR EXERCISING WITH TYPE 1 OR TYPE 2 DIABETES   Check your blood glucose before exercising. If  blood glucose levels are greater than 240 mg/dL, check for urine ketones. Do not exercise if ketones are present.  Avoid injecting insulin into areas of the body that are going to be exercised. For example, avoid injecting insulin into:  The arms when playing tennis.  The legs when jogging.  Keep a record of:  Food intake before and after you exercise.  Expected peak times of insulin action.  Blood glucose levels before and after you exercise.  The type and amount of exercise you have done.  Review your records with your health care provider. Your health care provider will help you to develop guidelines for adjusting food intake and insulin amounts before and after exercising.  If you take insulin or oral hypoglycemic agents, watch for signs and symptoms of hypoglycemia. They include:  Dizziness.  Shaking.  Sweating.  Chills.  Confusion.  Drink plenty of water while you exercise to prevent dehydration or heat stroke. Body water is lost during exercise and must be replaced.  Talk to your health care provider before starting an exercise program to make sure it is safe for you. Remember, almost any type of activity is better than none. Document Released: 07/26/2003 Document Revised: 09/19/2013 Document Reviewed: 10/12/2012 ExitCare Patient Information 2015 [employer]. This information is not intended to replace advice given to you by your health care provider. Make sure you discuss any   questions you have with your health care provider. Basic Carbohydrate Counting for Diabetes Mellitus Carbohydrate counting is a method for keeping track of the amount of carbohydrates you eat. Eating carbohydrates naturally increases the level of sugar (glucose) in your blood, so it is important for you to know the amount that is okay for you to have in every meal. Carbohydrate counting helps keep the level of glucose in your blood within normal limits. The amount of carbohydrates allowed is  different for every person. A dietitian can help you calculate the amount that is right for you. Once you know the amount of carbohydrates you can have, you can count the carbohydrates in the foods you want to eat. Carbohydrates are found in the following foods:  Grains, such as breads and cereals.  Dried beans and soy products.  Starchy vegetables, such as potatoes, peas, and corn.  Fruit and fruit juices.  Milk and yogurt.  Sweets and snack foods, such as cake, cookies, candy, chips, soft drinks, and fruit drinks. CARBOHYDRATE COUNTING There are two ways to count the carbohydrates in your food. You can use either of the methods or a combination of both. Reading the "Nutrition Facts" on Packaged Food The "Nutrition Facts" is an area that is included on the labels of almost all packaged food and beverages in the [place]. It includes the serving size of that food or beverage and information about the nutrients in each serving of the food, including the grams (g) of carbohydrate per serving.  Decide the number of servings of this food or beverage that you will be able to eat or drink. Multiply that number of servings by the number of grams of carbohydrate that is listed on the label for that serving. The total will be the amount of carbohydrates you will be having when you eat or drink this food or beverage. Learning Standard Serving Sizes of Food When you eat food that is not packaged or does not include "Nutrition Facts" on the label, you need to measure the servings in order to count the amount of carbohydrates.A serving of most carbohydrate-rich foods contains about 15 g of carbohydrates. The following list includes serving sizes of carbohydrate-rich foods that provide 15 g ofcarbohydrate per serving:   1 slice of bread (1 oz) or 1 six-inch tortilla.    of a hamburger bun or English muffin.  4-6 crackers.   cup unsweetened dry cereal.    cup hot cereal.   cup rice or  pasta.    cup mashed potatoes or  of a large baked potato.  1 cup fresh fruit or one small piece of fruit.    cup canned or frozen fruit or fruit juice.  1 cup milk.   cup plain fat-free yogurt or yogurt sweetened with artificial sweeteners.   cup cooked dried beans or starchy vegetable, such as peas, corn, or potatoes.  Decide the number of standard-size servings that you will eat. Multiply that number of servings by 15 (the grams of carbohydrates in that serving). For example, if you eat 2 cups of strawberries, you will have eaten 2 servings and 30 g of carbohydrates (2 servings x 15 g = 30 g). For foods such as soups and casseroles, in which more than one food is mixed in, you will need to count the carbohydrates in each food that is included. EXAMPLE OF CARBOHYDRATE COUNTING Sample Dinner  3 oz chicken breast.   cup of brown rice.   cup of corn.    1 cup milk.   1 cup strawberries with sugar-free whipped topping.  Carbohydrate Calculation Step 1: Identify the foods that contain carbohydrates:   Rice.   Corn.   Milk.   Strawberries. Step 2:Calculate the number of servings eaten of each:   2 servings of rice.   1 serving of corn.   1 serving of milk.   1 serving of strawberries. Step 3: Multiply each of those number of servings by 15 g:   2 servings of rice x 15 g = 30 g.   1 serving of corn x 15 g = 15 g.   1 serving of milk x 15 g = 15 g.   1 serving of strawberries x 15 g = 15 g. Step 4: Add together all of the amounts to find the total grams of carbohydrates eaten: 30 g + 15 g + 15 g + 15 g = 75 g. Document Released: 05/05/2005 Document Revised: 09/19/2013 Document Reviewed: 04/01/2013 [employer] Patient Information 2015 [place], [place]. This information is not intended to replace advice given to you by your health care provider. Make sure you discuss any questions you have with your health care provider. DASH Eating Plan DASH stands  for "Dietary Approaches to Stop Hypertension." The DASH eating plan is a healthy eating plan that has been shown to reduce high blood pressure (hypertension). Additional health benefits may include reducing the risk of type 2 diabetes mellitus, heart disease, and stroke. The DASH eating plan may also help with weight loss. WHAT DO I NEED TO KNOW ABOUT THE DASH EATING PLAN? For the DASH eating plan, you will follow these general guidelines:  Choose foods with a percent daily value for sodium of less than 5% (as listed on the food label).  Use salt-free seasonings or herbs instead of table salt or sea salt.  Check with your health care provider or pharmacist before using salt substitutes.  Eat lower-sodium products, often labeled as "lower sodium" or "no salt added."  Eat fresh foods.  Eat more vegetables, fruits, and low-fat dairy products.  Choose whole grains. Look for the word "whole" as the first word in the ingredient list.  Choose fish and skinless chicken or [place] more often than red meat. Limit fish, poultry, and meat to 6 oz (170 g) each day.  Limit sweets, desserts, sugars, and sugary drinks.  Choose heart-healthy fats.  Limit cheese to 1 oz (28 g) per day.  Eat more home-cooked food and less restaurant, buffet, and fast food.  Limit fried foods.  Cook foods using methods other than frying.  Limit canned vegetables. If you do use them, rinse them well to decrease the sodium.  When eating at a restaurant, ask that your food be prepared with less salt, or no salt if possible. WHAT FOODS CAN I EAT? Seek help from a dietitian for individual calorie needs. Grains Whole grain or whole wheat bread. Brown rice. Whole grain or whole wheat pasta. Quinoa, bulgur, and whole grain cereals. Low-sodium cereals. Corn or whole wheat flour tortillas. Whole grain cornbread. Whole grain crackers. Low-sodium crackers. Vegetables Fresh or frozen vegetables (raw, steamed, roasted, or  grilled). Low-sodium or reduced-sodium tomato and vegetable juices. Low-sodium or reduced-sodium tomato sauce and paste. Low-sodium or reduced-sodium canned vegetables.  Fruits All fresh, canned (in natural juice), or frozen fruits. Meat and Other Protein Products Ground beef (85% or leaner), grass-fed beef, or beef trimmed of fat. Skinless chicken or [place]. Ground chicken or [place]. Pork trimmed of fat. All  fish and seafood. Eggs. Dried beans, peas, or lentils. Unsalted nuts and seeds. Unsalted canned beans. Dairy Low-fat dairy products, such as skim or 1% milk, 2% or reduced-fat cheeses, low-fat ricotta or cottage cheese, or plain low-fat yogurt. Low-sodium or reduced-sodium cheeses. Fats and Oils Tub margarines without trans fats. Light or reduced-fat mayonnaise and salad dressings (reduced sodium). Avocado. Safflower, olive, or canola oils. Natural peanut or almond butter. Other Unsalted popcorn and pretzels. The items listed above may not be a complete list of recommended foods or beverages. Contact your dietitian for more options. WHAT FOODS ARE NOT RECOMMENDED? Grains White bread. White pasta. White rice. Refined cornbread. Bagels and croissants. Crackers that contain trans fat. Vegetables Creamed or fried vegetables. Vegetables in a cheese sauce. Regular canned vegetables. Regular canned tomato sauce and paste. Regular tomato and vegetable juices. Fruits Dried fruits. Canned fruit in light or heavy syrup. Fruit juice. Meat and Other Protein Products Fatty cuts of meat. Ribs, chicken wings, bacon, sausage, bologna, salami, chitterlings, fatback, hot dogs, bratwurst, and packaged luncheon meats. Salted nuts and seeds. Canned beans with salt. Dairy Whole or 2% milk, cream, half-and-half, and cream cheese. Whole-fat or sweetened yogurt. Full-fat cheeses or blue cheese. Nondairy creamers and whipped toppings. Processed cheese, cheese spreads, or cheese curds. Condiments Onion and garlic  salt, seasoned salt, table salt, and sea salt. Canned and packaged gravies. Worcestershire sauce. Tartar sauce. Barbecue sauce. Teriyaki sauce. Soy sauce, including reduced sodium. Steak sauce. Fish sauce. Oyster sauce. Cocktail sauce. Horseradish. Ketchup and mustard. Meat flavorings and tenderizers. Bouillon cubes. Hot sauce. Tabasco sauce. Marinades. Taco seasonings. Relishes. Fats and Oils Butter, stick margarine, lard, shortening, ghee, and bacon fat. Coconut, palm kernel, or palm oils. Regular salad dressings. Other Pickles and olives. Salted popcorn and pretzels. The items listed above may not be a complete list of foods and beverages to avoid. Contact your dietitian for more information. WHERE CAN I FIND MORE INFORMATION? National Heart, Lung, and Blood Institute: travelstabloid.com Document Released: 04/24/2011 Document Revised: 09/19/2013 Document Reviewed: 03/09/2013 [facility] Patient Information 2015 [place], [place]. This information is not intended to replace advice given to you by your health care provider. Make sure you discuss any questions you have with your health care provider.

## 2014-08-25 NOTE — Progress Notes (Signed)
Pt is here today for a BP check b/c she was put on a new BP medication.

## 2014-08-25 NOTE — Progress Notes (Signed)
Patient presents for BP check Med list reviewed; states taking all meds as directed Discussed need for low sodium diet and using Mrs. Dash as alternative to salt Encouraged to choose foods with 5% or less of daily value for sodium. Patient walking 30 minutes per day for exercise  BP 122/83 P 92 R 16  T  98.6 oral SPO2  97%  Wt 218 lb  Patient aware that she is to f/u with PCP 3 months from last visit (Due 11/04/14)  Patient given literature on DASH Eating Plan Diabetes and Food, Diabetes and Exercise, Basic Carb Counting

## 2014-08-31 ENCOUNTER — Ambulatory Visit: Payer: Self-pay | Attending: Internal Medicine

## 2014-09-04 ENCOUNTER — Ambulatory Visit (HOSPITAL_COMMUNITY): Payer: Self-pay

## 2014-09-18 ENCOUNTER — Ambulatory Visit (HOSPITAL_COMMUNITY)
Admission: RE | Admit: 2014-09-18 | Discharge: 2014-09-18 | Disposition: A | Discharge status: Home / Self Care | Payer: Self-pay | Source: Ambulatory Visit | Attending: Internal Medicine | Admitting: Internal Medicine

## 2014-09-18 DIAGNOSIS — Z1231 Encounter for screening mammogram for malignant neoplasm of breast: Secondary | ICD-10-CM

## 2014-09-19 ENCOUNTER — Telehealth: Payer: Self-pay

## 2014-09-19 NOTE — Telephone Encounter (Signed)
Patient not available Left message on voice mail to return our call 

## 2014-09-19 NOTE — Telephone Encounter (Signed)
-----   Message from Boykin Nearing, MD sent at 09/19/2014  9:20 AM EDT ----- Negative screening mammogram Repeat in one year

## 2014-09-20 ENCOUNTER — Telehealth: Payer: Self-pay

## 2014-09-20 NOTE — Telephone Encounter (Signed)
Returned patient phone call and she is aware of her mammogram results

## 2014-09-20 NOTE — Telephone Encounter (Signed)
Pt returning call

## 2014-11-07 ENCOUNTER — Encounter: Payer: Self-pay | Admitting: Internal Medicine

## 2014-11-07 ENCOUNTER — Ambulatory Visit: Payer: Self-pay | Attending: Internal Medicine | Admitting: Internal Medicine

## 2014-11-07 VITALS — BP 138/91 | HR 78 | Temp 98.0°F | Resp 16 | Wt 219.0 lb

## 2014-11-07 DIAGNOSIS — R7303 Prediabetes: Secondary | ICD-10-CM | POA: Insufficient documentation

## 2014-11-07 DIAGNOSIS — R7309 Other abnormal glucose: Secondary | ICD-10-CM | POA: Insufficient documentation

## 2014-11-07 DIAGNOSIS — I10 Essential (primary) hypertension: Secondary | ICD-10-CM | POA: Insufficient documentation

## 2014-11-07 DIAGNOSIS — M545 Low back pain, unspecified: Secondary | ICD-10-CM | POA: Insufficient documentation

## 2014-11-07 DIAGNOSIS — G8929 Other chronic pain: Secondary | ICD-10-CM | POA: Insufficient documentation

## 2014-11-07 DIAGNOSIS — E559 Vitamin D deficiency, unspecified: Secondary | ICD-10-CM | POA: Insufficient documentation

## 2014-11-07 MED ORDER — LISINOPRIL-HYDROCHLOROTHIAZIDE 10-12.5 MG PO TABS: 1.0000 | ORAL_TABLET | Freq: Every day | ORAL | Status: DC

## 2014-11-07 MED ORDER — GABAPENTIN 300 MG PO CAPS: 300.0000 mg | ORAL_CAPSULE | Freq: Three times a day (TID) | ORAL | Status: DC

## 2014-11-07 NOTE — Patient Instructions (Signed)
DASH Eating Plan DASH stands for "Dietary Approaches to Stop Hypertension." The DASH eating plan is a healthy eating plan that has been shown to reduce high blood pressure (hypertension). Additional health benefits may include reducing the risk of type 2 diabetes mellitus, heart disease, and stroke. The DASH eating plan may also help with weight loss. WHAT DO I NEED TO KNOW ABOUT THE DASH EATING PLAN? For the DASH eating plan, you will follow these general guidelines:  Choose foods with a percent daily value for sodium of less than 5% (as listed on the food label).  Use salt-free seasonings or herbs instead of table salt or sea salt.  Check with your health care provider or pharmacist before using salt substitutes.  Eat lower-sodium products, often labeled as "lower sodium" or "no salt added."  Eat fresh foods.  Eat more vegetables, fruits, and low-fat dairy products.  Choose whole grains. Look for the word "whole" as the first word in the ingredient list.  Choose fish and skinless chicken or turkey more often than red meat. Limit fish, poultry, and meat to 6 oz (170 g) each day.  Limit sweets, desserts, sugars, and sugary drinks.  Choose heart-healthy fats.  Limit cheese to 1 oz (28 g) per day.  Eat more home-cooked food and less restaurant, buffet, and fast food.  Limit fried foods.  Cook foods using methods other than frying.  Limit canned vegetables. If you do use them, rinse them well to decrease the sodium.  When eating at a restaurant, ask that your food be prepared with less salt, or no salt if possible. WHAT FOODS CAN I EAT? Seek help from a dietitian for individual calorie needs. Grains Whole grain or whole wheat bread. Brown rice. Whole grain or whole wheat pasta. Quinoa, bulgur, and whole grain cereals. Low-sodium cereals. Corn or whole wheat flour tortillas. Whole grain cornbread. Whole grain crackers. Low-sodium crackers. Vegetables Fresh or frozen vegetables  (raw, steamed, roasted, or grilled). Low-sodium or reduced-sodium tomato and vegetable juices. Low-sodium or reduced-sodium tomato sauce and paste. Low-sodium or reduced-sodium canned vegetables.  Fruits All fresh, canned (in natural juice), or frozen fruits. Meat and Other Protein Products Ground beef (85% or leaner), grass-fed beef, or beef trimmed of fat. Skinless chicken or turkey. Ground chicken or turkey. Pork trimmed of fat. All fish and seafood. Eggs. Dried beans, peas, or lentils. Unsalted nuts and seeds. Unsalted canned beans. Dairy Low-fat dairy products, such as skim or 1% milk, 2% or reduced-fat cheeses, low-fat ricotta or cottage cheese, or plain low-fat yogurt. Low-sodium or reduced-sodium cheeses. Fats and Oils Tub margarines without trans fats. Light or reduced-fat mayonnaise and salad dressings (reduced sodium). Avocado. Safflower, olive, or canola oils. Natural peanut or almond butter. Other Unsalted popcorn and pretzels. The items listed above may not be a complete list of recommended foods or beverages. Contact your dietitian for more options. WHAT FOODS ARE NOT RECOMMENDED? Grains White bread. White pasta. White rice. Refined cornbread. Bagels and croissants. Crackers that contain trans fat. Vegetables Creamed or fried vegetables. Vegetables in a cheese sauce. Regular canned vegetables. Regular canned tomato sauce and paste. Regular tomato and vegetable juices. Fruits Dried fruits. Canned fruit in light or heavy syrup. Fruit juice. Meat and Other Protein Products Fatty cuts of meat. Ribs, chicken wings, bacon, sausage, bologna, salami, chitterlings, fatback, hot dogs, bratwurst, and packaged luncheon meats. Salted nuts and seeds. Canned beans with salt. Dairy Whole or 2% milk, cream, half-and-half, and cream cheese. Whole-fat or sweetened yogurt. Full-fat   cheeses or blue cheese. Nondairy creamers and whipped toppings. Processed cheese, cheese spreads, or cheese  curds. Condiments Onion and garlic salt, seasoned salt, table salt, and sea salt. Canned and packaged gravies. Worcestershire sauce. Tartar sauce. Barbecue sauce. Teriyaki sauce. Soy sauce, including reduced sodium. Steak sauce. Fish sauce. Oyster sauce. Cocktail sauce. Horseradish. Ketchup and mustard. Meat flavorings and tenderizers. Bouillon cubes. Hot sauce. Tabasco sauce. Marinades. Taco seasonings. Relishes. Fats and Oils Butter, stick margarine, lard, shortening, ghee, and bacon fat. Coconut, palm kernel, or palm oils. Regular salad dressings. Other Pickles and olives. Salted popcorn and pretzels. The items listed above may not be a complete list of foods and beverages to avoid. Contact your dietitian for more information. WHERE CAN I FIND MORE INFORMATION? National Heart, Lung, and [employer] Institute: www.nhlbi.nih.gov/health/health-topics/topics/dash/ Document Released: 04/24/2011 Document Revised: 09/19/2013 Document Reviewed: 03/09/2013 ExitCare Patient Information 2015 [employer]. This information is not intended to replace advice given to you by your health care provider. Make sure you discuss any questions you have with your health care provider. Diabetes Mellitus and Food It is important for you to manage your blood sugar (glucose) level. Your blood glucose level can be greatly affected by what you eat. Eating healthier foods in the appropriate amounts throughout the day at about the same time each day will help you control your blood glucose level. It can also help slow or prevent worsening of your diabetes mellitus. Healthy eating may even help you improve the level of your blood pressure and reach or maintain a healthy weight.  HOW CAN FOOD AFFECT ME? Carbohydrates Carbohydrates affect your blood glucose level more than any other type of food. Your dietitian will help you determine how many carbohydrates to eat at each meal and teach you how to count carbohydrates. Counting  carbohydrates is important to keep your blood glucose at a healthy level, especially if you are using insulin or taking certain medicines for diabetes mellitus. Alcohol Alcohol can cause sudden decreases in blood glucose (hypoglycemia), especially if you use insulin or take certain medicines for diabetes mellitus. Hypoglycemia can be a life-threatening condition. Symptoms of hypoglycemia (sleepiness, dizziness, and disorientation) are similar to symptoms of having too much alcohol.  If your health care provider has given you approval to drink alcohol, do so in moderation and use the following guidelines:  Women should not have more than one drink per day, and men should not have more than two drinks per day. One drink is equal to:  12 oz of beer.  5 oz of wine.  1 oz of hard liquor.  Do not drink on an empty stomach.  Keep yourself hydrated. Have water, diet soda, or unsweetened iced tea.  Regular soda, juice, and other mixers might contain a lot of carbohydrates and should be counted. WHAT FOODS ARE NOT RECOMMENDED? As you make food choices, it is important to remember that all foods are not the same. Some foods have fewer nutrients per serving than other foods, even though they might have the same number of calories or carbohydrates. It is difficult to get your body what it needs when you eat foods with fewer nutrients. Examples of foods that you should avoid that are high in calories and carbohydrates but low in nutrients include:  Trans fats (most processed foods list trans fats on the Nutrition Facts label).  Regular soda.  Juice.  Candy.  Sweets, such as cake, pie, doughnuts, and cookies.  Fried foods. WHAT FOODS CAN I EAT? Have nutrient-rich foods,   which will nourish your body and keep you healthy. The food you should eat also will depend on several factors, including:  The calories you need.  The medicines you take.  Your weight.  Your blood glucose level.  Your  blood pressure level.  Your cholesterol level. You also should eat a variety of foods, including:  Protein, such as meat, poultry, fish, tofu, nuts, and seeds (lean animal proteins are best).  Fruits.  Vegetables.  Dairy products, such as milk, cheese, and yogurt (low fat is best).  Breads, grains, pasta, cereal, rice, and beans.  Fats such as olive oil, trans fat-free margarine, canola oil, avocado, and olives. DOES EVERYONE WITH DIABETES MELLITUS HAVE THE SAME MEAL PLAN? Because every person with diabetes mellitus is different, there is not one meal plan that works for everyone. It is very important that you meet with a dietitian who will help you create a meal plan that is just right for you. Document Released: 01/30/2005 Document Revised: 05/10/2013 Document Reviewed: 04/01/2013 ExitCare Patient Information 2015 [employer]. This information is not intended to replace advice given to you by your health care provider. Make sure you discuss any questions you have with your health care provider.  

## 2014-11-07 NOTE — Progress Notes (Signed)
MRN: [id number] Name: Rhonda [name]  Sex: female Age: 54 y.o. DOB: [birth year]  Allergies: Aspirin and Augmentin  Chief Complaint  Patient presents with  . Follow-up    HPI: Patient is 54 y.o. female who History of hypertension, chronic low back pain comes today for followup she is requesting refill on her medications, blood work reviewed with the patient noticed hemoglobin A1c6.3%, patient does report family history of diabetes, she's advised for low carbohydrate diet, currently she denies any acute symptoms denies any headache dizziness chest and shortness of breath.  Past Medical History  Diagnosis Date  . Hypertension     Past Surgical History  Procedure Laterality Date  . Abdominal hysterectomy    . Cesarean section        Medication List       This list is accurate as of: 11/07/14  4:33 PM.  Always use your most recent med list.               cholecalciferol 1000 UNITS tablet  Commonly known as:  VITAMIN D  Take 2,000 Units by mouth daily.     gabapentin 300 MG capsule  Commonly known as:  NEURONTIN  Take 1 capsule (300 mg total) by mouth 3 (three) times daily.     glucosamine-chondroitin 500-400 MG tablet  Take 4 tablets by mouth 3 (three) times daily.     lisinopril-hydrochlorothiazide 10-12.5 MG per tablet  Commonly known as:  ZESTORETIC  Take 1 tablet by mouth daily.     multivitamin tablet  Take 1 tablet by mouth daily.     traMADol 50 MG tablet  Commonly known as:  ULTRAM  Take 1 tablet (50 mg total) by mouth every 6 (six) hours as needed.        Meds ordered this encounter  Medications  . gabapentin (NEURONTIN) 300 MG capsule    Sig: Take 1 capsule (300 mg total) by mouth 3 (three) times daily.    Dispense:  90 capsule    Refill:  3  . lisinopril-hydrochlorothiazide (ZESTORETIC) 10-12.5 MG per tablet    Sig: Take 1 tablet by mouth daily.    Dispense:  30 tablet    Refill:  3     There is no immunization history on file for this  patient.  Family History  Problem Relation Age of Onset  . Cancer Mother   . Hypertension Sister   . Diabetes Sister   . Diabetes Maternal Grandmother     History  Substance Use Topics  . Smoking status: Never Smoker   . Smokeless tobacco: Not on file  . Alcohol Use: No    Review of Systems   As noted in HPI  Filed Vitals:   11/07/14 1437  BP: 138/91  Pulse: 78  Temp: 98 F (36.7 C)  Resp: 16    Physical Exam  Physical Exam  Constitutional: No distress.  Eyes: EOM are normal. Pupils are equal, round, and reactive to light.  Cardiovascular: Normal rate and regular rhythm.   Pulmonary/Chest: Breath sounds normal. She has no wheezes. She has no rales.  Musculoskeletal: She exhibits no edema.    CBC    Component Value Date/Time   WBC 9.8 08/04/2014 1441   RBC 4.88 08/04/2014 1441   HGB 13.0 08/04/2014 1441   HCT 39.3 08/04/2014 1441   PLT 318 08/04/2014 1441   MCV 80.5 08/04/2014 1441   LYMPHSABS 3.3 08/04/2014 1441   MONOABS 0.7 08/04/2014 1441   EOSABS  0.1 08/04/2014 1441   BASOSABS 0.0 08/04/2014 1441    CMP     Component Value Date/Time   NA 140 08/04/2014 1441   K 4.2 08/04/2014 1441   CL 103 08/04/2014 1441   CO2 25 08/04/2014 1441   GLUCOSE 84 08/04/2014 1441   BUN 15 08/04/2014 1441   CREATININE 0.90 08/04/2014 1441   CREATININE 0.77 06/15/2012 1124   CALCIUM 9.7 08/04/2014 1441   PROT 6.9 08/04/2014 1441   ALBUMIN 3.9 08/04/2014 1441   AST 39* 08/04/2014 1441   ALT 30 08/04/2014 1441   ALKPHOS 78 08/04/2014 1441   BILITOT 0.3 08/04/2014 1441   GFRNONAA 73 08/04/2014 1441   GFRNONAA >90 06/15/2012 1124   GFRAA 84 08/04/2014 1441   GFRAA >90 06/15/2012 1124    Lab Results  Component Value Date/Time   CHOL 229* 06/15/2012 11:24 AM    Lab Results  Component Value Date/Time   HGBA1C 6.3* 08/04/2014 02:41 PM    Lab Results  Component Value Date/Time   AST 39* 08/04/2014 02:41 PM    Assessment and Plan  Essential  hypertension - Plan:blood pressure is borderline elevated, advise patient for DASH diet continue with  lisinopril-hydrochlorothiazide (ZESTORETIC) 10-12.5 MG per tablet  Chronic low back pain - Plan: gabapentin (NEURONTIN) 300 MG capsule  Prediabetes Patient is advised for low carbohydrate diet. Repeat A1c on the following visit.  Vitamin D deficiency Patient has started taking over-the-counter vitamin D 2000 units.    Return in about 3 months (around 02/07/2015), or if symptoms worsen or fail to improve.   This note has been created with Surveyor, quantity. Any transcriptional errors are unintentional.    Lorayne Marek, MD

## 2014-11-07 NOTE — Progress Notes (Signed)
Patient here for follow up on her HTN and for medication refills

## 2015-03-15 ENCOUNTER — Encounter (HOSPITAL_COMMUNITY): Payer: Self-pay | Admitting: Emergency Medicine

## 2015-03-15 ENCOUNTER — Emergency Department (HOSPITAL_COMMUNITY)
Admission: EM | Admit: 2015-03-15 | Discharge: 2015-03-15 | Disposition: A | Discharge status: Home / Self Care | Payer: Self-pay | Attending: Emergency Medicine | Admitting: Emergency Medicine

## 2015-03-15 DIAGNOSIS — M5441 Lumbago with sciatica, right side: Secondary | ICD-10-CM | POA: Insufficient documentation

## 2015-03-15 DIAGNOSIS — I10 Essential (primary) hypertension: Secondary | ICD-10-CM | POA: Insufficient documentation

## 2015-03-15 DIAGNOSIS — Z79899 Other long term (current) drug therapy: Secondary | ICD-10-CM | POA: Insufficient documentation

## 2015-03-15 DIAGNOSIS — G8929 Other chronic pain: Secondary | ICD-10-CM | POA: Insufficient documentation

## 2015-03-15 HISTORY — DX: Other intervertebral disc degeneration, lumbar region without mention of lumbar back pain or lower extremity pain: M51.369

## 2015-03-15 HISTORY — DX: Other intervertebral disc degeneration, lumbar region: M51.36

## 2015-03-15 HISTORY — DX: Unspecified osteoarthritis, unspecified site: M19.90

## 2015-03-15 MED ORDER — METHOCARBAMOL 500 MG PO TABS
500.0000 mg | ORAL_TABLET | Freq: Once | ORAL | Status: AC
  Administered 2015-03-15: 500 mg via ORAL
  Filled 2015-03-15: qty 1

## 2015-03-15 MED ORDER — METHOCARBAMOL 500 MG PO TABS: 500.0000 mg | ORAL_TABLET | Freq: Two times a day (BID) | ORAL | Status: DC

## 2015-03-15 MED ORDER — TRAMADOL HCL 50 MG PO TABS: 50.0000 mg | ORAL_TABLET | Freq: Four times a day (QID) | ORAL | Status: DC | PRN

## 2015-03-15 MED ORDER — TRAMADOL HCL 50 MG PO TABS
50.0000 mg | ORAL_TABLET | Freq: Once | ORAL | Status: AC
  Administered 2015-03-15: 50 mg via ORAL
  Filled 2015-03-15: qty 1

## 2015-03-15 NOTE — ED Provider Notes (Signed)
CSN: [id number]     Arrival date & time 03/15/15  0827 History  By signing my name below, I, [name], attest that this documentation has been prepared under the direction and in the presence of Ottie Glazier, PA-C Electronically Signed: [name], ED Scribe. 03/15/2015. 9:13 AM.   Chief Complaint  Patient presents with  . Back Pain      The history is provided by the patient and a relative. No language interpreter was used.    HPI Comments: Rhonda [name] is a 54 y.o. female with a medical hx of chronic low back pain, who presents to the Emergency Department complaining of low back pain onset last night. She reports that the back pain does radiate to her right hip/leg. She notes that her current symptoms are different from her typical back pain and that her pain is worsened with position changes. She denies fall/heavy lifting/ recent prednisone use. She has had a xray of her back years ago and was dx with sciatica, arthritis, and DDD. She states that she is having associated symptoms of numbness/tingling of right feet. She states that she has not tried any medications for the relief of her symptoms. Pt denies bowel/bladder incontinence and any other symptoms. Denies CA or IV drug use. Denies back surgery.   Past Medical History  Diagnosis Date  . Hypertension   . Arthritis   . Degenerative disc disease, lumbar    Past Surgical History  Procedure Laterality Date  . Abdominal hysterectomy    . Cesarean section     Family History  Problem Relation Age of Onset  . Cancer Mother   . Hypertension Sister   . Diabetes Sister   . Diabetes Maternal Grandmother    Social History  Substance Use Topics  . Smoking status: Never Smoker   . Smokeless tobacco: None  . Alcohol Use: No   OB History    No data available     Review of Systems  Gastrointestinal:       No bowel incontinence  Genitourinary:       No bladder incontinence  Musculoskeletal: Positive for back pain.  Negative for gait problem.  Skin: Negative for color change, rash and wound.  Neurological: Positive for numbness (tingling to right toes).  All other systems reviewed and are negative.     Allergies  Aspirin and Augmentin  Home Medications   Prior to Admission medications   Medication Sig Start Date End Date Taking? Authorizing Provider  cholecalciferol (VITAMIN D) 1000 UNITS tablet Take 2,000 Units by mouth daily.    Historical Provider, MD  gabapentin (NEURONTIN) 300 MG capsule Take 1 capsule (300 mg total) by mouth 3 (three) times daily. 11/07/14   Lorayne Marek, MD  glucosamine-chondroitin 500-400 MG tablet Take 4 tablets by mouth 3 (three) times daily.    Historical Provider, MD  lisinopril-hydrochlorothiazide (ZESTORETIC) 10-12.5 MG per tablet Take 1 tablet by mouth daily. 11/07/14   Lorayne Marek, MD  methocarbamol (ROBAXIN) 500 MG tablet Take 1 tablet (500 mg total) by mouth 2 (two) times daily. 03/15/15   Lanaiya Lantry Patel-Mills, PA-C  Multiple Vitamin (MULTIVITAMIN) tablet Take 1 tablet by mouth daily.    Historical Provider, MD  traMADol (ULTRAM) 50 MG tablet Take 1 tablet (50 mg total) by mouth every 6 (six) hours as needed. 03/15/15   Chaniqua Brisby Patel-Mills, PA-C   BP 155/90 mmHg  Pulse 76  Temp(Src) 98.6 F (37 C) (Oral)  Resp 16  SpO2 100% Physical Exam  Constitutional: She is oriented to person, place, and time. She appears well-developed and well-nourished. No distress.  HENT:  Head: Normocephalic and atraumatic.  Eyes: Conjunctivae and EOM are normal.  Neck: Neck supple.  Cardiovascular: Normal rate.   Pulmonary/Chest: Effort normal. No respiratory distress.  Musculoskeletal: Normal range of motion.  No saddle anesthesia. No lower extremity weakness. Right sided lumbar paravertebral  tenderness but no midline tenderness. Radiation and pain down right leg. Pt is ambulatory. NVI.  Able to plantar and dorsi flex without difficulty.  Neurological: She is alert and oriented to  person, place, and time.  Skin: Skin is warm and dry.  Psychiatric: She has a normal mood and affect. Her behavior is normal.  Nursing note and vitals reviewed.   ED Course  Procedures (including critical care time) DIAGNOSTIC STUDIES: Oxygen Saturation is 10% on RA, nl by my interpretation.    COORDINATION OF CARE: 9:10 AM Discussed treatment plan with pt at bedside which includes robaxin Rx and tramadol Rx and pt agreed to plan.    Labs Review Labs Reviewed - No data to display  Imaging Review No results found.    EKG Interpretation None      MDM   Final diagnoses:  Right-sided low back pain with right-sided sciatica  Patient presents for low back pain.  She has no red flags.  This is most likely sciatic pain. She has had back pain in the past and had an MRI in 2013 which was not concerning for cord compression. I discussed return precautions as well as follow-up and she verbally agrees with the plan. Rx: Tramadol, Robaxin  I personally performed the services described in this documentation, which was scribed in my presence. The recorded information has been reviewed and is accurate.    Ottie Glazier, PA-C 03/15/15 [place], MD 03/16/15 1257

## 2015-03-15 NOTE — Discharge Instructions (Signed)
Back Exercises Follow-up with your primary care physician. If you have pain in your back, do these exercises 2-3 times each day or as told by your doctor. When the pain goes away, do the exercises once each day, but repeat the steps more times for each exercise (do more repetitions). If you do not have pain in your back, do these exercises once each day or as told by your doctor. EXERCISES Single Knee to Chest Do these steps 3-5 times in a row for each leg: 1. Lie on your back on a firm bed or the floor with your legs stretched out. 2. Bring one knee to your chest. 3. Hold your knee to your chest by grabbing your knee or thigh. 4. Pull on your knee until you feel a gentle stretch in your lower back. 5. Keep doing the stretch for 10-30 seconds. 6. Slowly let go of your leg and straighten it. Pelvic Tilt Do these steps 5-10 times in a row: 1. Lie on your back on a firm bed or the floor with your legs stretched out. 2. Bend your knees so they point up to the ceiling. Your feet should be flat on the floor. 3. Tighten your lower belly (abdomen) muscles to press your lower back against the floor. This will make your tailbone point up to the ceiling instead of pointing down to your feet or the floor. 4. Stay in this position for 5-10 seconds while you gently tighten your muscles and breathe evenly. Cat-Cow Do these steps until your lower back bends more easily: 1. Get on your hands and knees on a firm surface. Keep your hands under your shoulders, and keep your knees under your hips. You may put padding under your knees. 2. Let your head hang down, and make your tailbone point down to the floor so your lower back is round like the back of a cat. 3. Stay in this position for 5 seconds. 4. Slowly lift your head and make your tailbone point up to the ceiling so your back hangs low (sags) like the back of a cow. 5. Stay in this position for 5 seconds. Press-Ups Do these steps 5-10 times in a  row: 1. Lie on your belly (face-down) on the floor. 2. Place your hands near your head, about shoulder-width apart. 3. While you keep your back relaxed and keep your hips on the floor, slowly straighten your arms to raise the top half of your body and lift your shoulders. Do not use your back muscles. To make yourself more comfortable, you may change where you place your hands. 4. Stay in this position for 5 seconds. 5. Slowly return to lying flat on the floor. Bridges Do these steps 10 times in a row: 1. Lie on your back on a firm surface. 2. Bend your knees so they point up to the ceiling. Your feet should be flat on the floor. 3. Tighten your butt muscles and lift your butt off of the floor until your waist is almost as high as your knees. If you do not feel the muscles working in your butt and the back of your thighs, slide your feet 1-2 inches farther away from your butt. 4. Stay in this position for 3-5 seconds. 5. Slowly lower your butt to the floor, and let your butt muscles relax. If this exercise is too easy, try doing it with your arms crossed over your chest. Belly Crunches Do these steps 5-10 times in a row: 1. Lie on  your back on a firm bed or the floor with your legs stretched out. 2. Bend your knees so they point up to the ceiling. Your feet should be flat on the floor. 3. Cross your arms over your chest. 4. Tip your chin a little bit toward your chest but do not bend your neck. 5. Tighten your belly muscles and slowly raise your chest just enough to lift your shoulder blades a tiny bit off of the floor. 6. Slowly lower your chest and your head to the floor. Back Lifts Do these steps 5-10 times in a row: 1. Lie on your belly (face-down) with your arms at your sides, and rest your forehead on the floor. 2. Tighten the muscles in your legs and your butt. 3. Slowly lift your chest off of the floor while you keep your hips on the floor. Keep the back of your head in line with  the curve in your back. Look at the floor while you do this. 4. Stay in this position for 3-5 seconds. 5. Slowly lower your chest and your face to the floor. GET HELP IF:  Your back pain gets a lot worse when you do an exercise.  Your back pain does not lessen 2 hours after you exercise. If you have any of these problems, stop doing the exercises. Do not do them again unless your doctor says it is okay. GET HELP RIGHT AWAY IF:  You have sudden, very bad back pain. If this happens, stop doing the exercises. Do not do them again unless your doctor says it is okay.   This information is not intended to replace advice given to you by your health care provider. Make sure you discuss any questions you have with your health care provider.   Document Released: 06/07/2010 Document Revised: 01/24/2015 Document Reviewed: 06/29/2014 Elsevier Interactive Patient Education [employer].

## 2015-03-15 NOTE — ED Notes (Signed)
Patient states chronic low back pain.  Patient states she was given tramadol for pain, and states she hasn't had to take it for a while.   Patient states she ran out of tramadol.   Patient states "it seems like its in a different spot".

## 2015-04-11 ENCOUNTER — Encounter: Payer: Self-pay | Admitting: Family Medicine

## 2015-04-11 ENCOUNTER — Ambulatory Visit (HOSPITAL_COMMUNITY)
Admission: RE | Admit: 2015-04-11 | Discharge: 2015-04-11 | Disposition: A | Discharge status: Home / Self Care | Payer: Self-pay | Source: Ambulatory Visit | Attending: Family Medicine | Admitting: Family Medicine

## 2015-04-11 ENCOUNTER — Ambulatory Visit: Payer: Self-pay | Attending: Family Medicine | Admitting: Family Medicine

## 2015-04-11 VITALS — BP 147/98 | HR 80 | Temp 98.8°F | Resp 17 | Ht 63.0 in | Wt 216.0 lb

## 2015-04-11 DIAGNOSIS — R7303 Prediabetes: Secondary | ICD-10-CM | POA: Insufficient documentation

## 2015-04-11 DIAGNOSIS — Z79899 Other long term (current) drug therapy: Secondary | ICD-10-CM | POA: Insufficient documentation

## 2015-04-11 DIAGNOSIS — M545 Low back pain: Secondary | ICD-10-CM | POA: Insufficient documentation

## 2015-04-11 DIAGNOSIS — M25551 Pain in right hip: Secondary | ICD-10-CM | POA: Insufficient documentation

## 2015-04-11 DIAGNOSIS — G8929 Other chronic pain: Secondary | ICD-10-CM | POA: Insufficient documentation

## 2015-04-11 LAB — GLUCOSE, POCT (MANUAL RESULT ENTRY): POC GLUCOSE: 98 mg/dL (ref 70–99)

## 2015-04-11 LAB — POCT GLYCOSYLATED HEMOGLOBIN (HGB A1C): Hemoglobin A1C: 6.1

## 2015-04-11 MED ORDER — GLUCOSAMINE-CHONDROITIN 500-400 MG PO TABS: 1.0000 | ORAL_TABLET | Freq: Three times a day (TID) | ORAL | Status: DC

## 2015-04-11 MED ORDER — GABAPENTIN 300 MG PO CAPS: 600.0000 mg | ORAL_CAPSULE | Freq: Three times a day (TID) | ORAL | Status: DC

## 2015-04-11 MED ORDER — TRAMADOL HCL 50 MG PO TABS: 50.0000 mg | ORAL_TABLET | Freq: Four times a day (QID) | ORAL | Status: DC | PRN

## 2015-04-11 MED ORDER — NAPROXEN 500 MG PO TABS: 500.0000 mg | ORAL_TABLET | Freq: Two times a day (BID) | ORAL | Status: DC | PRN

## 2015-04-11 NOTE — Progress Notes (Signed)
Subjective:  Patient ID: Rhonda [name], female    DOB: 1960-12-02  Age: 54 y.o. MRN: [id number]  CC: Back Pain   HPI Rhonda [name] presents for    1. Chronic low back pain: x 5 years. Worsening pain. Also with tingling and numbness down legs. Recent flare up. Went to ED. Now had numbness, tingling, R hip pain. No fecal or urinary incontinence. No weakness. She is uninsured. No orange card or [employer] discount.   2. Prediabetes: denies polyuria, polydipsia. No vision changes.   Social History  Substance Use Topics  . Smoking status: Never Smoker   . Smokeless tobacco: Not on file  . Alcohol Use: No    Outpatient Prescriptions Prior to Visit  Medication Sig Dispense Refill  . cholecalciferol (VITAMIN D) 1000 UNITS tablet Take 2,000 Units by mouth daily.    Marland Kitchen gabapentin (NEURONTIN) 300 MG capsule Take 1 capsule (300 mg total) by mouth 3 (three) times daily. 90 capsule 3  . glucosamine-chondroitin 500-400 MG tablet Take 4 tablets by mouth 3 (three) times daily.    Marland Kitchen lisinopril-hydrochlorothiazide (ZESTORETIC) 10-12.5 MG per tablet Take 1 tablet by mouth daily. 30 tablet 3  . methocarbamol (ROBAXIN) 500 MG tablet Take 1 tablet (500 mg total) by mouth 2 (two) times daily. 20 tablet 0  . Multiple Vitamin (MULTIVITAMIN) tablet Take 1 tablet by mouth daily.    . traMADol (ULTRAM) 50 MG tablet Take 1 tablet (50 mg total) by mouth every 6 (six) hours as needed. 15 tablet 0   No facility-administered medications prior to visit.    ROS Review of Systems  Constitutional: Negative for fever and chills.  Eyes: Negative for visual disturbance.  Respiratory: Negative for shortness of breath.   Cardiovascular: Negative for chest pain.  Gastrointestinal: Negative for abdominal pain and blood in stool.  Musculoskeletal: Positive for back pain, arthralgias and gait problem.  Skin: Negative for rash.  Allergic/Immunologic: Negative for immunocompromised state.  Neurological: Positive for  numbness.  Hematological: Negative for adenopathy. Does not bruise/bleed easily.  Psychiatric/Behavioral: Negative for suicidal ideas and dysphoric mood.    Objective:  BP 147/98 mmHg  Pulse 80  Temp(Src) 98.8 F (37.1 C)  Resp 17  Ht 5\' 3"  (1.6 m)  Wt 216 lb (97.977 kg)  BMI 38.27 kg/m2  SpO2 99%  BP/Weight 04/11/2015 03/15/2015 [id number]  Systolic BP [id number]  Diastolic BP 98 90 91  Wt. (Lbs) 216 - 219  BMI 38.27 - 38.8   Physical Exam  Constitutional: She is oriented to person, place, and time. She appears well-developed and well-nourished. No distress.  HENT:  Head: Normocephalic and atraumatic.  Cardiovascular: Normal rate, regular rhythm, normal heart sounds and intact distal pulses.   Pulmonary/Chest: Effort normal and breath sounds normal.  Musculoskeletal: She exhibits no edema.  Neurological: She is alert and oriented to person, place, and time.  Skin: Skin is warm and dry. No rash noted.  Psychiatric: She has a normal mood and affect.   Lab Results  Component Value Date   HGBA1C 6.10 04/11/2015   CBG 98   Assessment & Plan:   Problem List Items Addressed This Visit    Chronic low back pain (Chronic)    A: chronic low back with radicular symptoms P:  Increase gabapentin Tramadol Naproxen Home PT       Relevant Medications   glucosamine-chondroitin 500-400 MG tablet   gabapentin (NEURONTIN) 300 MG capsule   traMADol (ULTRAM) 50 MG tablet  naproxen (NAPROSYN) 500 MG tablet   Prediabetes - Primary    prediabetes, A1c downtrending       Relevant Orders   HgB A1c (Completed)   Glucose (CBG) (Completed)   Right hip pain (Chronic)    A: R hip pain. Suspect radicular pain. Less likely hip arthritis. Possible trochanteric bursitis P:  X-ray Naproxen Tramadol       Relevant Orders   DG HIP UNILAT WITH PELVIS 1V RIGHT      No orders of the defined types were placed in this encounter.    Follow-up: No Follow-up on file.   Boykin Nearing MD

## 2015-04-11 NOTE — Progress Notes (Signed)
Patient is here for a follow up on her chronic back pain Patient states her back had given out and she was seen in the ED a few weeks back

## 2015-04-11 NOTE — Assessment & Plan Note (Signed)
A: R hip pain. Suspect radicular pain. Less likely hip arthritis. Possible trochanteric bursitis P:  X-ray Naproxen Tramadol

## 2015-04-11 NOTE — Assessment & Plan Note (Signed)
prediabetes, A1c downtrending

## 2015-04-11 NOTE — Patient Instructions (Addendum)
[place] was seen today for back pain.  Diagnoses and all orders for this visit:  Prediabetes -     HgB A1c -     Glucose (CBG)  Chronic low back pain -     glucosamine-chondroitin 500-400 MG tablet; Take 1 tablet by mouth 3 (three) times daily. -     gabapentin (NEURONTIN) 300 MG capsule; Take 2 capsules (600 mg total) by mouth 3 (three) times daily. -     traMADol (ULTRAM) 50 MG tablet; Take 1 tablet (50 mg total) by mouth every 6 (six) hours as needed. -     naproxen (NAPROSYN) 500 MG tablet; Take 1 tablet (500 mg total) by mouth 2 (two) times daily as needed for moderate pain (with food).  Right hip pain -     DG HIP UNILAT WITH PELVIS 1V RIGHT; Future   Low Back Sprain With Rehab A sprain is an injury in which a ligament is torn. The ligaments of the lower back are vulnerable to sprains. However, they are strong and require great force to be injured. These ligaments are important for stabilizing the spinal column. Sprains are classified into three categories. Grade 1 sprains cause pain, but the tendon is not lengthened. Grade 2 sprains include a lengthened ligament, due to the ligament being stretched or partially ruptured. With grade 2 sprains there is still function, although the function may be decreased. Grade 3 sprains involve a complete tear of the tendon or muscle, and function is usually impaired. SYMPTOMS   Severe pain in the lower back.  Sometimes, a feeling of a "pop," "snap," or tear, at the time of injury.  Tenderness and sometimes swelling at the injury site.  Uncommonly, bruising (contusion) within 48 hours of injury.  Muscle spasms in the back. CAUSES  Low back sprains occur when a force is placed on the ligaments that is greater than they can handle. Common causes of injury include:  Performing a stressful act while off-balance.  Repetitive stressful activities that involve movement of the lower back.  Direct hit (trauma) to the lower back. RISK INCREASES  WITH:  Contact sports (football, wrestling).  Collisions (major skiing accidents).  Sports that require throwing or lifting (baseball, weightlifting).  Sports involving twisting of the spine (gymnastics, diving, tennis, golf).  Poor strength and flexibility.  Inadequate protection.  Previous back injury or surgery (especially fusion). PREVENTION  Wear properly fitted and padded protective equipment.  Warm up and stretch properly before activity.  Allow for adequate recovery between workouts.  Maintain physical fitness:  Strength, flexibility, and endurance.  Cardiovascular fitness.  Maintain a healthy body weight. PROGNOSIS  If treated properly, low back sprains usually heal with non-surgical treatment. The length of time for healing depends on the severity of the injury.  RELATED COMPLICATIONS   Recurring symptoms, resulting in a chronic problem.  Chronic inflammation and pain in the low back.  Delayed healing or resolution of symptoms, especially if activity is resumed too soon.  Prolonged impairment.  Unstable or arthritic joints of the low back. TREATMENT  Treatment first involves the use of ice and medicine, to reduce pain and inflammation. The use of strengthening and stretching exercises may help reduce pain with activity. These exercises may be performed at home or with a therapist. Severe injuries may require referral to a therapist for further evaluation and treatment, such as ultrasound. Your caregiver may advise that you wear a back brace or corset, to help reduce pain and discomfort. Often, prolonged bed  rest results in greater harm then benefit. Corticosteroid injections may be recommended. However, these should be reserved for the most serious cases. It is important to avoid using your back when lifting objects. At night, sleep on your back on a firm mattress, with a pillow placed under your knees. If non-surgical treatment is unsuccessful, surgery may be  needed.  MEDICATION   If pain medicine is needed, nonsteroidal anti-inflammatory medicines (aspirin and ibuprofen), or other minor pain relievers (acetaminophen), are often advised.  Do not take pain medicine for 7 days before surgery.  Prescription pain relievers may be given, if your caregiver thinks they are needed. Use only as directed and only as much as you need.  Ointments applied to the skin may be helpful.  Corticosteroid injections may be given by your caregiver. These injections should be reserved for the most serious cases, because they may only be given a certain number of times. HEAT AND COLD  Cold treatment (icing) should be applied for 10 to 15 minutes every 2 to 3 hours for inflammation and pain, and immediately after activity that aggravates your symptoms. Use ice packs or an ice massage.  Heat treatment may be used before performing stretching and strengthening activities prescribed by your caregiver, physical therapist, or athletic trainer. Use a heat pack or a warm water soak. SEEK MEDICAL CARE IF:   Symptoms get worse or do not improve in 2 to 4 weeks, despite treatment.  You develop numbness or weakness in either leg.  You lose bowel or bladder function.  Any of the following occur after surgery: fever, increased pain, swelling, redness, drainage of fluids, or bleeding in the affected area.  New, unexplained symptoms develop. (Drugs used in treatment may produce side effects.) EXERCISES  RANGE OF MOTION (ROM) AND STRETCHING EXERCISES - Low Back Sprain Most people with lower back pain will find that their symptoms get worse with excessive bending forward (flexion) or arching at the lower back (extension). The exercises that will help resolve your symptoms will focus on the opposite motion.  Your physician, physical therapist or athletic trainer will help you determine which exercises will be most helpful to resolve your lower back pain. Do not complete any  exercises without first consulting with your caregiver. Discontinue any exercises which make your symptoms worse, until you speak to your caregiver. If you have pain, numbness or tingling which travels down into your buttocks, leg or foot, the goal of the therapy is for these symptoms to move closer to your back and eventually resolve. Sometimes, these leg symptoms will get better, but your lower back pain may worsen. This is often an indication of progress in your rehabilitation. Be very alert to any changes in your symptoms and the activities in which you participated in the 24 hours prior to the change. Sharing this information with your caregiver will allow him or her to most efficiently treat your condition. These exercises may help you when beginning to rehabilitate your injury. Your symptoms may resolve with or without further involvement from your physician, physical therapist or athletic trainer. While completing these exercises, remember:   Restoring tissue flexibility helps normal motion to return to the joints. This allows healthier, less painful movement and activity.  An effective stretch should be held for at least 30 seconds.  A stretch should never be painful. You should only feel a gentle lengthening or release in the stretched tissue. FLEXION RANGE OF MOTION AND STRETCHING EXERCISES: STRETCH - Flexion, Single Knee to Chest  Lie on a firm bed or floor with both legs extended in front of you.  Keeping one leg in contact with the floor, bring your opposite knee to your chest. Hold your leg in place by either grabbing behind your thigh or at your knee.  Pull until you feel a gentle stretch in your low back. Hold __________ seconds.  Slowly release your grasp and repeat the exercise with the opposite side. Repeat __________ times. Complete this exercise __________ times per day.  STRETCH - Flexion, Double Knee to Chest  Lie on a firm bed or floor with both legs extended in front  of you.  Keeping one leg in contact with the floor, bring your opposite knee to your chest.  Tense your stomach muscles to support your back and then lift your other knee to your chest. Hold your legs in place by either grabbing behind your thighs or at your knees.  Pull both knees toward your chest until you feel a gentle stretch in your low back. Hold __________ seconds.  Tense your stomach muscles and slowly return one leg at a time to the floor. Repeat __________ times. Complete this exercise __________ times per day.  STRETCH - Low Trunk Rotation  Lie on a firm bed or floor. Keeping your legs in front of you, bend your knees so they are both pointed toward the ceiling and your feet are flat on the floor.  Extend your arms out to the side. This will stabilize your upper body by keeping your shoulders in contact with the floor.  Gently and slowly drop both knees together to one side until you feel a gentle stretch in your low back. Hold for __________ seconds.  Tense your stomach muscles to support your lower back as you bring your knees back to the starting position. Repeat the exercise to the other side. Repeat __________ times. Complete this exercise __________ times per day  EXTENSION RANGE OF MOTION AND FLEXIBILITY EXERCISES: STRETCH - Extension, Prone on Elbows   Lie on your stomach on the floor, a bed will be too soft. Place your palms about shoulder width apart and at the height of your head.  Place your elbows under your shoulders. If this is too painful, stack pillows under your chest.  Allow your body to relax so that your hips drop lower and make contact more completely with the floor.  Hold this position for __________ seconds.  Slowly return to lying flat on the floor. Repeat __________ times. Complete this exercise __________ times per day.  RANGE OF MOTION - Extension, Prone Press Ups  Lie on your stomach on the floor, a bed will be too soft. Place your palms  about shoulder width apart and at the height of your head.  Keeping your back as relaxed as possible, slowly straighten your elbows while keeping your hips on the floor. You may adjust the placement of your hands to maximize your comfort. As you gain motion, your hands will come more underneath your shoulders.  Hold this position __________ seconds.  Slowly return to lying flat on the floor. Repeat __________ times. Complete this exercise __________ times per day.  RANGE OF MOTION- Quadruped, Neutral Spine   Assume a hands and knees position on a firm surface. Keep your hands under your shoulders and your knees under your hips. You may place padding under your knees for comfort.  Drop your head and point your tailbone toward the ground below you. This will round out your lower  back like an angry cat. Hold this position for __________ seconds.  Slowly lift your head and release your tail bone so that your back sags into a large arch, like an old horse.  Hold this position for __________ seconds.  Repeat this until you feel limber in your low back.  Now, find your "sweet spot." This will be the most comfortable position somewhere between the two previous positions. This is your neutral spine. Once you have found this position, tense your stomach muscles to support your low back.  Hold this position for __________ seconds. Repeat __________ times. Complete this exercise __________ times per day.  STRENGTHENING EXERCISES - Low Back Sprain These exercises may help you when beginning to rehabilitate your injury. These exercises should be done near your "sweet spot." This is the neutral, low-back arch, somewhere between fully rounded and fully arched, that is your least painful position. When performed in this safe range of motion, these exercises can be used for people who have either a flexion or extension based injury. These exercises may resolve your symptoms with or without further involvement  from your physician, physical therapist or athletic trainer. While completing these exercises, remember:   Muscles can gain both the endurance and the strength needed for everyday activities through controlled exercises.  Complete these exercises as instructed by your physician, physical therapist or athletic trainer. Increase the resistance and repetitions only as guided.  You may experience muscle soreness or fatigue, but the pain or discomfort you are trying to eliminate should never worsen during these exercises. If this pain does worsen, stop and make certain you are following the directions exactly. If the pain is still present after adjustments, discontinue the exercise until you can discuss the trouble with your caregiver. STRENGTHENING - Deep Abdominals, Pelvic Tilt   Lie on a firm bed or floor. Keeping your legs in front of you, bend your knees so they are both pointed toward the ceiling and your feet are flat on the floor.  Tense your lower abdominal muscles to press your low back into the floor. This motion will rotate your pelvis so that your tail bone is scooping upwards rather than pointing at your feet or into the floor. With a gentle tension and even breathing, hold this position for __________ seconds. Repeat __________ times. Complete this exercise __________ times per day.  STRENGTHENING - Abdominals, Crunches   Lie on a firm bed or floor. Keeping your legs in front of you, bend your knees so they are both pointed toward the ceiling and your feet are flat on the floor. Cross your arms over your chest.  Slightly tip your chin down without bending your neck.  Tense your abdominals and slowly lift your trunk high enough to just clear your shoulder blades. Lifting higher can put excessive stress on the lower back and does not further strengthen your abdominal muscles.  Control your return to the starting position. Repeat __________ times. Complete this exercise __________ times  per day.  STRENGTHENING - Quadruped, Opposite UE/LE Lift   Assume a hands and knees position on a firm surface. Keep your hands under your shoulders and your knees under your hips. You may place padding under your knees for comfort.  Find your neutral spine and gently tense your abdominal muscles so that you can maintain this position. Your shoulders and hips should form a rectangle that is parallel with the floor and is not twisted.  Keeping your trunk steady, lift your right hand no  higher than your shoulder and then your left leg no higher than your hip. Make sure you are not holding your breath. Hold this position for __________ seconds.  Continuing to keep your abdominal muscles tense and your back steady, slowly return to your starting position. Repeat with the opposite arm and leg. Repeat __________ times. Complete this exercise __________ times per day.  STRENGTHENING - Abdominals and Quadriceps, Straight Leg Raise   Lie on a firm bed or floor with both legs extended in front of you.  Keeping one leg in contact with the floor, bend the other knee so that your foot can rest flat on the floor.  Find your neutral spine, and tense your abdominal muscles to maintain your spinal position throughout the exercise.  Slowly lift your straight leg off the floor about 6 inches for a count of 15, making sure to not hold your breath.  Still keeping your neutral spine, slowly lower your leg all the way to the floor. Repeat this exercise with each leg __________ times. Complete this exercise __________ times per day. POSTURE AND BODY MECHANICS CONSIDERATIONS - Low Back Sprain Keeping correct posture when sitting, standing or completing your activities will reduce the stress put on different body tissues, allowing injured tissues a chance to heal and limiting painful experiences. The following are general guidelines for improved posture. Your physician or physical therapist will provide you with any  instructions specific to your needs. While reading these guidelines, remember:  The exercises prescribed by your provider will help you have the flexibility and strength to maintain correct postures.  The correct posture provides the best environment for your joints to work. All of your joints have less wear and tear when properly supported by a spine with good posture. This means you will experience a healthier, less painful body.  Correct posture must be practiced with all of your activities, especially prolonged sitting and standing. Correct posture is as important when doing repetitive low-stress activities (typing) as it is when doing a single heavy-load activity (lifting). RESTING POSITIONS Consider which positions are most painful for you when choosing a resting position. If you have pain with flexion-based activities (sitting, bending, stooping, squatting), choose a position that allows you to rest in a less flexed posture. You would want to avoid curling into a fetal position on your side. If your pain worsens with extension-based activities (prolonged standing, working overhead), avoid resting in an extended position such as sleeping on your stomach. Most people will find more comfort when they rest with their spine in a more neutral position, neither too rounded nor too arched. Lying on a non-sagging bed on your side with a pillow between your knees, or on your back with a pillow under your knees will often provide some relief. Keep in mind, being in any one position for a prolonged period of time, no matter how correct your posture, can still lead to stiffness. PROPER SITTING POSTURE In order to minimize stress and discomfort on your spine, you must sit with correct posture. Sitting with good posture should be effortless for a healthy body. Returning to good posture is a gradual process. Many people can work toward this most comfortably by using various supports until they have the flexibility  and strength to maintain this posture on their own. When sitting with proper posture, your ears will fall over your shoulders and your shoulders will fall over your hips. You should use the back of the chair to support your upper back.  Your lower back will be in a neutral position, just slightly arched. You may place a small pillow or folded towel at the base of your lower back for  support.  When working at a desk, create an environment that supports good, upright posture. Without extra support, muscles tire, which leads to excessive strain on joints and other tissues. Keep these recommendations in mind: CHAIR:  A chair should be able to slide under your desk when your back makes contact with the back of the chair. This allows you to work closely.  The chair's height should allow your eyes to be level with the upper part of your monitor and your hands to be slightly lower than your elbows. BODY POSITION  Your feet should make contact with the floor. If this is not possible, use a foot rest.  Keep your ears over your shoulders. This will reduce stress on your neck and low back. INCORRECT SITTING POSTURES  If you are feeling tired and unable to assume a healthy sitting posture, do not slouch or slump. This puts excessive strain on your back tissues, causing more damage and pain. Healthier options include:  Using more support, like a lumbar pillow.  Switching tasks to something that requires you to be upright or walking.  Talking a brief walk.  Lying down to rest in a neutral-spine position. PROLONGED STANDING WHILE SLIGHTLY LEANING FORWARD  When completing a task that requires you to lean forward while standing in one place for a long time, place either foot up on a stationary 2-4 inch high object to help maintain the best posture. When both feet are on the ground, the lower back tends to lose its slight inward curve. If this curve flattens (or becomes too large), then the back and your other  joints will experience too much stress, tire more quickly, and can cause pain. CORRECT STANDING POSTURES Proper standing posture should be assumed with all daily activities, even if they only take a few moments, like when brushing your teeth. As in sitting, your ears should fall over your shoulders and your shoulders should fall over your hips. You should keep a slight tension in your abdominal muscles to brace your spine. Your tailbone should point down to the ground, not behind your body, resulting in an over-extended swayback posture.  INCORRECT STANDING POSTURES  Common incorrect standing postures include a forward head, locked knees and/or an excessive swayback. WALKING Walk with an upright posture. Your ears, shoulders and hips should all line-up. PROLONGED ACTIVITY IN A FLEXED POSITION When completing a task that requires you to bend forward at your waist or lean over a low surface, try to find a way to stabilize 3 out of 4 of your limbs. You can place a hand or elbow on your thigh or rest a knee on the surface you are reaching across. This will provide you more stability, so that your muscles do not tire as quickly. By keeping your knees relaxed, or slightly bent, you will also reduce stress across your lower back. CORRECT LIFTING TECHNIQUES DO :  Assume a wide stance. This will provide you more stability and the opportunity to get as close as possible to the object which you are lifting.  Tense your abdominals to brace your spine. Bend at the knees and hips. Keeping your back locked in a neutral-spine position, lift using your leg muscles. Lift with your legs, keeping your back straight.  Test the weight of unknown objects before attempting to lift them.  Try to keep your elbows locked down at your sides in order get the best strength from your shoulders when carrying an object.  Always ask for help when lifting heavy or awkward objects. INCORRECT LIFTING TECHNIQUES DO NOT:   Lock  your knees when lifting, even if it is a small object.  Bend and twist. Pivot at your feet or move your feet when needing to change directions.  Assume that you can safely pick up even a paperclip without proper posture.   This information is not intended to replace advice given to you by your health care provider. Make sure you discuss any questions you have with your health care provider.   Document Released: 05/05/2005 Document Revised: 05/26/2014 Document Reviewed: 08/17/2008 Elsevier Interactive Patient Education [employer].  F/u in 3 months Dr. Adrian Blackwater

## 2015-04-11 NOTE — Assessment & Plan Note (Signed)
A: chronic low back with radicular symptoms P:  Increase gabapentin Tramadol Naproxen Home PT

## 2015-04-16 ENCOUNTER — Other Ambulatory Visit: Payer: Self-pay | Admitting: Internal Medicine

## 2015-04-18 ENCOUNTER — Telehealth: Payer: Self-pay | Admitting: *Deleted

## 2015-04-18 NOTE — Telephone Encounter (Signed)
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

## 2015-04-18 NOTE — Telephone Encounter (Signed)
Date of birth verified by pt X ray results given  Pt verbalized understanding

## 2015-04-30 ENCOUNTER — Encounter (HOSPITAL_COMMUNITY): Payer: Self-pay | Admitting: Emergency Medicine

## 2015-04-30 ENCOUNTER — Emergency Department (INDEPENDENT_AMBULATORY_CARE_PROVIDER_SITE_OTHER)
Admission: EM | Admit: 2015-04-30 | Discharge: 2015-04-30 | Disposition: A | Discharge status: Home / Self Care | Payer: Self-pay | Source: Home / Self Care

## 2015-04-30 DIAGNOSIS — M533 Sacrococcygeal disorders, not elsewhere classified: Secondary | ICD-10-CM

## 2015-04-30 DIAGNOSIS — G8929 Other chronic pain: Secondary | ICD-10-CM

## 2015-04-30 MED ORDER — TRIAMCINOLONE ACETONIDE 40 MG/ML IJ SUSP
INTRAMUSCULAR | Status: AC
  Filled 2015-04-30: qty 1

## 2015-04-30 MED ORDER — BUPIVACAINE HCL (PF) 0.5 % IJ SOLN
INTRAMUSCULAR | Status: AC
Start: 2015-04-30 — End: 2015-04-30
  Filled 2015-04-30: qty 10

## 2015-04-30 MED ORDER — TRIAMCINOLONE ACETONIDE 40 MG/ML IJ SUSP
Freq: Once | INTRAMUSCULAR | Status: AC
  Administered 2015-04-30: 19:00:00 via SUBCUTANEOUS

## 2015-04-30 NOTE — ED Notes (Signed)
Pt here with lower back pain radiating to right hip and pelvic area that started 12/9 Recent xray showed arthritis Taking Tramadol for pain  States pain worsens with standing and movement

## 2015-04-30 NOTE — Discharge Instructions (Signed)

## 2015-04-30 NOTE — ED Provider Notes (Signed)
CSN: [id number]     Arrival date & time 04/30/15  1338 History   None    Chief Complaint  Patient presents with  . Hip Pain   (Consider location/radiation/quality/duration/timing/severity/associated sxs/prior Treatment) HPI History obtained from patient:   LOCATION:right SI joint SEVERITY:6 DURATION:couple of weeks CONTEXT:sudden onset, had xray and evaluation by PCP a few weeks ago. Was told she has arthritis in the hip and pelvis.  QUALITY:ache, and heaviness MODIFYING FACTORS: finding a good spot of comfort ASSOCIATED SYMPTOMS: just constant pain  TIMING:constant OCCUPATION:  Past Medical History  Diagnosis Date  . Hypertension   . Arthritis   . Degenerative disc disease, lumbar    Past Surgical History  Procedure Laterality Date  . Abdominal hysterectomy    . Cesarean section     Family History  Problem Relation Age of Onset  . Cancer Mother   . Hypertension Sister   . Diabetes Sister   . Diabetes Maternal Grandmother    Social History  Substance Use Topics  . Smoking status: Never Smoker   . Smokeless tobacco: None  . Alcohol Use: No   OB History    No data available     Review of Systems +'ve right hip pain -'ve for weakness, chronic numbness Allergies  Aspirin and Augmentin  Home Medications   Prior to Admission medications   Medication Sig Start Date End Date Taking? Authorizing Provider  cholecalciferol (VITAMIN D) 1000 UNITS tablet Take 2,000 Units by mouth daily.    Historical Provider, MD  gabapentin (NEURONTIN) 300 MG capsule Take 2 capsules (600 mg total) by mouth 3 (three) times daily. 04/11/15   Boykin Nearing, MD  glucosamine-chondroitin 500-400 MG tablet Take 1 tablet by mouth 3 (three) times daily. 04/11/15   Josalyn Funches, MD  lisinopril-hydrochlorothiazide (PRINZIDE,ZESTORETIC) 10-12.5 MG tablet TAKE 1 TABLET BY MOUTH DAILY. 04/16/15   Boykin Nearing, MD  Multiple Vitamin (MULTIVITAMIN) tablet Take 1 tablet by mouth daily.     Historical Provider, MD  naproxen (NAPROSYN) 500 MG tablet Take 1 tablet (500 mg total) by mouth 2 (two) times daily as needed for moderate pain (with food). 04/11/15   Josalyn Funches, MD  Omega-3 Fatty Acids (FISH OIL PO) Take by mouth.    Historical Provider, MD  traMADol (ULTRAM) 50 MG tablet Take 1 tablet (50 mg total) by mouth every 6 (six) hours as needed. 04/11/15   Boykin Nearing, MD   Meds Ordered and Administered this Visit  Medications - No data to display  BP 152/88 mmHg  Pulse 78  Temp(Src) 98 F (36.7 C) (Oral)  SpO2 99% No data found.   Physical Exam  Constitutional: She appears well-developed and well-nourished.  HENT:  Head: Normocephalic and atraumatic.  Musculoskeletal:       Back:  Nursing note and vitals reviewed.   ED Course  Injection of joint Date/Time: 04/30/2015 4:36 PM Performed by: Konrad Felix Authorized by: Linde Gillis C Consent: Verbal consent obtained. Risks and benefits: risks, benefits and alternatives were discussed Consent given by: patient Patient understanding: patient states understanding of the procedure being performed Patient identity confirmed: verbally with patient and arm band Time out: Immediately prior to procedure a "time out" was called to verify the correct patient, procedure, equipment, support staff and site/side marked as required. Preparation: Patient was prepped and draped in the usual sterile fashion. Patient tolerance: Patient tolerated the procedure well with no immediate complications Comments: SI joint infiltration with bupivacaine and kenalog   (including critical care  time)  Labs Review Labs Reviewed - No data to display  Imaging Review No results found.   Visual Acuity Review  Right Eye Distance:   Left Eye Distance:   Bilateral Distance:    Right Eye Near:   Left Eye Near:    Bilateral Near:         MDM   1. Chronic right SI joint pain    Kenalog and bupivacaine was injected  into the SI joint please see under procedures . Patient has good relief, she is able to move around the room essentially pain-free at this time. She states that she has enough pain medication and anti-inflammatory medicine at home she does request a return to work note. I have advised her to follow up with her primary care provider the week. Structures of care provided discharged home in stable condition.  THIS NOTE WAS GENERATED USING A VOICE RECOGNITION SOFTWARE PROGRAM. ALL REASONABLE EFFORTS  WERE MADE TO PROOFREAD THIS DOCUMENT FOR ACCURACY.     Konrad Felix, PA 04/30/15 1759

## 2015-06-18 MED FILL — GABAPENTIN 300 MG CAPSULE: 300 | 30 days supply | Qty: 180 | Fill #2

## 2015-06-18 MED FILL — NAPROXEN 500 MG TABLET: 500 | 30 days supply | Qty: 60 | Fill #1

## 2015-06-18 MED FILL — LISINOPRIL-HCTZ 10-12.5 MG: 10-12.5 | 30 days supply | Qty: 30 | Fill #1

## 2015-07-19 ENCOUNTER — Ambulatory Visit: Payer: Self-pay | Admitting: Family Medicine

## 2015-07-26 ENCOUNTER — Other Ambulatory Visit: Payer: Self-pay | Admitting: Family Medicine

## 2015-07-26 MED FILL — LISINOPRIL-HCTZ 10-12.5 MG: 10-12.5 | 30 days supply | Qty: 30 | Fill #2

## 2015-07-26 MED FILL — GABAPENTIN 300 MG CAPSULE: 300 | 30 days supply | Qty: 180 | Fill #0

## 2015-08-02 ENCOUNTER — Ambulatory Visit: Payer: Self-pay | Attending: Family Medicine | Admitting: Family Medicine

## 2015-08-02 ENCOUNTER — Encounter: Payer: Self-pay | Admitting: Family Medicine

## 2015-08-02 VITALS — BP 145/88 | HR 101 | Temp 99.0°F | Resp 16 | Ht 63.0 in | Wt 218.0 lb

## 2015-08-02 DIAGNOSIS — Z79899 Other long term (current) drug therapy: Secondary | ICD-10-CM | POA: Insufficient documentation

## 2015-08-02 DIAGNOSIS — I10 Essential (primary) hypertension: Secondary | ICD-10-CM | POA: Insufficient documentation

## 2015-08-02 DIAGNOSIS — Z114 Encounter for screening for human immunodeficiency virus [HIV]: Secondary | ICD-10-CM | POA: Insufficient documentation

## 2015-08-02 DIAGNOSIS — Z1159 Encounter for screening for other viral diseases: Secondary | ICD-10-CM

## 2015-08-02 DIAGNOSIS — R05 Cough: Secondary | ICD-10-CM | POA: Insufficient documentation

## 2015-08-02 DIAGNOSIS — M545 Low back pain: Secondary | ICD-10-CM | POA: Insufficient documentation

## 2015-08-02 DIAGNOSIS — G8929 Other chronic pain: Secondary | ICD-10-CM | POA: Insufficient documentation

## 2015-08-02 MED ORDER — LISINOPRIL-HYDROCHLOROTHIAZIDE 10-12.5 MG PO TABS: 1.0000 | ORAL_TABLET | Freq: Every day | ORAL | Status: DC

## 2015-08-02 MED ORDER — GABAPENTIN 300 MG PO CAPS: ORAL_CAPSULE | ORAL | Status: DC

## 2015-08-02 NOTE — Progress Notes (Signed)
Subjective:  Patient ID: Rhonda [name], female    DOB: 05-12-1961  Age: 55 y.o. MRN: [id number]  CC: Hypertension   HPI HAILA HINIKER presents for   1. CHRONIC HYPERTENSION  Disease Monitoring  Blood pressure range: 120/70-something down to 100/70 something   Chest pain: no   Dyspnea: no   Claudication: no   Medication compliance: yes  Medication Side Effects  Lightheadedness: no   Urinary frequency: no   Edema: yes      Preventitive Healthcare:  Exercise: yes, walks 5 timers per week before week, walks about  15-20 minutes   Diet Pattern: regular meals   Salt Restriction: yes   2. URI: cough x congestion for past 2-3 weeks. Improving. No CP or SOB. No fever or chills. Taking OTC cough medicine for patient with high BP.   Social History  Substance Use Topics  . Smoking status: Never Smoker   . Smokeless tobacco: Not on file  . Alcohol Use: No    Outpatient Prescriptions Prior to Visit  Medication Sig Dispense Refill  . cholecalciferol (VITAMIN D) 1000 UNITS tablet Take 2,000 Units by mouth daily.    Marland Kitchen gabapentin (NEURONTIN) 300 MG capsule TAKE 2 CAPSULES BY MOUTH 3 TIMES DAILY. 180 capsule 0  . glucosamine-chondroitin 500-400 MG tablet Take 1 tablet by mouth 3 (three) times daily. 90 tablet 2  . lisinopril-hydrochlorothiazide (PRINZIDE,ZESTORETIC) 10-12.5 MG tablet TAKE 1 TABLET BY MOUTH DAILY. 30 tablet 3  . naproxen (NAPROSYN) 500 MG tablet Take 1 tablet (500 mg total) by mouth 2 (two) times daily as needed for moderate pain (with food). 60 tablet 1  . Omega-3 Fatty Acids (FISH OIL PO) Take by mouth.    . traMADol (ULTRAM) 50 MG tablet Take 1 tablet (50 mg total) by mouth every 6 (six) hours as needed. (Patient not taking: Reported on 08/02/2015) 60 tablet 0  . Multiple Vitamin (MULTIVITAMIN) tablet Take 1 tablet by mouth daily.     No facility-administered medications prior to visit.    ROS Review of Systems  Constitutional: Negative for fever and chills.    HENT: Positive for congestion.   Eyes: Negative for visual disturbance.  Respiratory: Positive for cough. Negative for shortness of breath.   Cardiovascular: Negative for chest pain.  Gastrointestinal: Negative for abdominal pain and blood in stool.  Musculoskeletal: Positive for back pain. Negative for arthralgias.  Skin: Negative for rash.  Allergic/Immunologic: Negative for immunocompromised state.  Hematological: Negative for adenopathy. Does not bruise/bleed easily.  Psychiatric/Behavioral: Negative for suicidal ideas and dysphoric mood.    Objective:  BP 145/88 mmHg  Pulse 101  Temp(Src) 99 F (37.2 C) (Oral)  Resp 16  Ht 5\' 3"  (1.6 m)  Wt 218 lb (98.884 kg)  BMI 38.63 kg/m2  SpO2 100%  BP/Weight 08/02/2015 04/30/2015 99991111  Systolic BP Q000111Q 0000000 Q000111Q  Diastolic BP 88 88 98  Wt. (Lbs) 218 - 216  BMI 38.63 - 38.27   Pulse Readings from Last 3 Encounters:  08/02/15 101  04/30/15 78  04/11/15 80    Physical Exam  Constitutional: She is oriented to person, place, and time. She appears well-developed and well-nourished. No distress.  HENT:  Head: Normocephalic and atraumatic.  Cardiovascular: Normal rate, regular rhythm, normal heart sounds and intact distal pulses.   Pulmonary/Chest: Effort normal and breath sounds normal.  Musculoskeletal: She exhibits no edema.  Neurological: She is alert and oriented to person, place, and time.  Skin: Skin is warm and dry.  No rash noted.  Psychiatric: She has a normal mood and affect.   Lab Results  Component Value Date   HGBA1C 6.10 04/11/2015     Assessment & Plan:  Darline was seen today for hypertension.  Diagnoses and all orders for this visit:  Essential hypertension -     COMPLETE METABOLIC PANEL WITH GFR -     lisinopril-hydrochlorothiazide (PRINZIDE,ZESTORETIC) 10-12.5 MG tablet; Take 1 tablet by mouth daily. -     Basic Metabolic Panel  Chronic low back pain -     gabapentin (NEURONTIN) 300 MG capsule;  TAKE 2 CAPSULES BY MOUTH 3 TIMES DAILY.  Screening for HIV (human immunodeficiency virus) -     HIV antibody (with reflex)  Need for hepatitis C screening test -     Hepatitis C antibody, reflex    Meds ordered this encounter  Medications  . lisinopril-hydrochlorothiazide (PRINZIDE,ZESTORETIC) 10-12.5 MG tablet    Sig: Take 1 tablet by mouth daily.    Dispense:  30 tablet    Refill:  5  . gabapentin (NEURONTIN) 300 MG capsule    Sig: TAKE 2 CAPSULES BY MOUTH 3 TIMES DAILY.    Dispense:  180 capsule    Refill:  5    Follow-up: No Follow-up on file.   Boykin Nearing MD

## 2015-08-02 NOTE — Assessment & Plan Note (Signed)
A: elevated in setting of recent URI P: Continue current regimen F/u in 6 weeks for recheck

## 2015-08-02 NOTE — Patient Instructions (Addendum)
Praise was seen today for hypertension.  Diagnoses and all orders for this visit:  Essential hypertension -     COMPLETE METABOLIC PANEL WITH GFR -     lisinopril-hydrochlorothiazide (PRINZIDE,ZESTORETIC) 10-12.5 MG tablet; Take 1 tablet by mouth daily. -     Basic Metabolic Panel  Chronic low back pain -     gabapentin (NEURONTIN) 300 MG capsule; TAKE 2 CAPSULES BY MOUTH 3 TIMES DAILY.  Screening for HIV (human immunodeficiency virus) -     HIV antibody (with reflex)  Need for hepatitis C screening test -     Hepatitis C antibody, reflex   F/u in 6 weeks for HTN  Dr. Adrian Blackwater

## 2015-08-02 NOTE — Progress Notes (Signed)
F/U HTN  Taking medication as prescribed No tobacco user  No pain today  No suicidal thoughts in the past two weeks

## 2015-08-03 ENCOUNTER — Encounter: Payer: Self-pay | Admitting: Clinical

## 2015-08-03 LAB — COMPLETE METABOLIC PANEL WITH GFR
ALBUMIN: 3.4 g/dL — AB (ref 3.6–5.1)
ALK PHOS: 77 U/L (ref 33–130)
ALT: 40 U/L — ABNORMAL HIGH (ref 6–29)
AST: 60 U/L — ABNORMAL HIGH (ref 10–35)
BILIRUBIN TOTAL: 0.3 mg/dL (ref 0.2–1.2)
BUN: 15 mg/dL (ref 7–25)
CO2: 27 mmol/L (ref 20–31)
Calcium: 8.8 mg/dL (ref 8.6–10.4)
Chloride: 102 mmol/L (ref 98–110)
Creat: 0.85 mg/dL (ref 0.50–1.05)
GFR, EST NON AFRICAN AMERICAN: 78 mL/min (ref >=60)
Glucose, Bld: 104 mg/dL — ABNORMAL HIGH (ref 65–99)
Potassium: 3.5 mmol/L (ref 3.5–5.3)
Sodium: 140 mmol/L (ref 135–146)
TOTAL PROTEIN: 6.5 g/dL (ref 6.1–8.1)

## 2015-08-03 LAB — HIV ANTIBODY (ROUTINE TESTING W REFLEX): HIV 1&2 Ab, 4th Generation: NONREACTIVE

## 2015-08-03 LAB — HEPATITIS C ANTIBODY: HCV AB: NEGATIVE

## 2015-08-03 NOTE — Progress Notes (Signed)
Depression screen [facility] 2/9 08/02/2015  Decreased Interest 0  Down, Depressed, Hopeless 0  PHQ - 2 Score 0  Altered sleeping 1  Tired, decreased energy 2  Change in appetite 0  Feeling bad or failure about yourself  0  Trouble concentrating 0  Moving slowly or fidgety/restless 0  Suicidal thoughts 0  PHQ-9 Score 3    GAD 7 : Generalized Anxiety Score 08/02/2015  Nervous, Anxious, on Edge 0  Control/stop worrying 0  Worry too much - different things 0  Trouble relaxing 0  Restless 0  Easily annoyed or irritable 0  Afraid - awful might happen 0  Total GAD 7 Score 0

## 2015-08-08 ENCOUNTER — Telehealth: Payer: Self-pay | Admitting: *Deleted

## 2015-08-08 NOTE — Telephone Encounter (Signed)
LVM to return call.

## 2015-08-08 NOTE — Telephone Encounter (Signed)
-----   Message from Boykin Nearing, MD sent at 08/03/2015  8:41 AM EDT ----- All labs normal except for slightly elevated liver enzymes Avoid tylenol < 3000 mg daily dose, limit alcohol if drinking to 1 drink per day

## 2015-08-08 NOTE — Telephone Encounter (Signed)
Pt. Returned call. Pt. Would like to be reached after 2:15 because she works. Please f/u

## 2015-08-27 ENCOUNTER — Other Ambulatory Visit: Payer: Self-pay | Admitting: Family Medicine

## 2015-08-27 MED FILL — LISINOPRIL-HCTZ 10-12.5 MG: 10-12.5 | 30 days supply | Qty: 30 | Fill #3

## 2015-08-27 MED FILL — NAPROXEN 500 MG TABLET: 500 | 30 days supply | Qty: 60 | Fill #0

## 2015-08-27 MED FILL — GABAPENTIN 300 MG CAPSULE: 300 | 30 days supply | Qty: 180 | Fill #1

## 2015-09-27 ENCOUNTER — Encounter (INDEPENDENT_AMBULATORY_CARE_PROVIDER_SITE_OTHER): Payer: Self-pay

## 2015-09-27 ENCOUNTER — Ambulatory Visit: Payer: Self-pay | Attending: Family Medicine | Admitting: Family Medicine

## 2015-09-27 ENCOUNTER — Encounter: Payer: Self-pay | Admitting: Family Medicine

## 2015-09-27 VITALS — BP 134/85 | HR 83 | Temp 98.5°F | Resp 16 | Ht 63.0 in | Wt 220.0 lb

## 2015-09-27 DIAGNOSIS — M545 Low back pain, unspecified: Secondary | ICD-10-CM

## 2015-09-27 DIAGNOSIS — G4710 Hypersomnia, unspecified: Secondary | ICD-10-CM

## 2015-09-27 DIAGNOSIS — G8929 Other chronic pain: Secondary | ICD-10-CM

## 2015-09-27 DIAGNOSIS — Z79899 Other long term (current) drug therapy: Secondary | ICD-10-CM | POA: Insufficient documentation

## 2015-09-27 DIAGNOSIS — R400 Somnolence: Secondary | ICD-10-CM | POA: Insufficient documentation

## 2015-09-27 DIAGNOSIS — G4733 Obstructive sleep apnea (adult) (pediatric): Secondary | ICD-10-CM | POA: Insufficient documentation

## 2015-09-27 DIAGNOSIS — I10 Essential (primary) hypertension: Secondary | ICD-10-CM | POA: Insufficient documentation

## 2015-09-27 MED ORDER — LISINOPRIL-HYDROCHLOROTHIAZIDE 10-12.5 MG PO TABS: 1.0000 | ORAL_TABLET | Freq: Every day | ORAL | Status: DC

## 2015-09-27 MED ORDER — GABAPENTIN 300 MG PO CAPS: ORAL_CAPSULE | ORAL | Status: DC

## 2015-09-27 MED FILL — GABAPENTIN 300 MG CAPSULE: 300 | 30 days supply | Qty: 180 | Fill #0

## 2015-09-27 MED FILL — LISINOPRIL-HCTZ 10-12.5 MG: 10-12.5 | 30 days supply | Qty: 30 | Fill #0

## 2015-09-27 NOTE — Assessment & Plan Note (Signed)
Obese patient Suspect OSA  Sleep study ordered Weight loss recommended

## 2015-09-27 NOTE — Assessment & Plan Note (Signed)
Well-controlled.  Continue current regimen. 

## 2015-09-27 NOTE — Progress Notes (Signed)
F/U HTN Taking medication daily  No tobacco user  No pain today  No suicidal thoughts in the past two weeks

## 2015-09-27 NOTE — Patient Instructions (Signed)
Rhonda [name] was seen today for hypertension.  Diagnoses and all orders for this visit:  Daytime somnolence -     Split night study; Future  Essential hypertension -     lisinopril-hydrochlorothiazide (PRINZIDE,ZESTORETIC) 10-12.5 MG tablet; Take 1 tablet by mouth daily.  Chronic low back pain -     gabapentin (NEURONTIN) 300 MG capsule; TAKE 2 CAPSULES BY MOUTH 3 TIMES DAILY.   With your snoring and being tired during the day  I am concerned about sleep apnea Sleep study ordered Work hard on weight loss with consistent exercise and low sugar/carb and reduced calorie diet   F/u in 6 months for HTN  Dr. Adrian Blackwater   Sleep Apnea  Sleep apnea is a sleep disorder characterized by abnormal pauses in breathing while you sleep. When your breathing pauses, the level of oxygen in your blood decreases. This causes you to move out of deep sleep and into light sleep. As a result, your quality of sleep is poor, and the system that carries your blood throughout your body (cardiovascular system) experiences stress. If sleep apnea remains untreated, the following conditions can develop:  High blood pressure (hypertension).  Coronary artery disease.  Inability to achieve or maintain an erection (impotence).  Impairment of your thought process (cognitive dysfunction). There are three types of sleep apnea: 1. Obstructive sleep apnea--Pauses in breathing during sleep because of a blocked airway. 2. Central sleep apnea--Pauses in breathing during sleep because the area of the brain that controls your breathing does not send the correct signals to the muscles that control breathing. 3. Mixed sleep apnea--A combination of both obstructive and central sleep apnea. RISK FACTORS The following risk factors can increase your risk of developing sleep apnea:  Being overweight.  Smoking.  Having narrow passages in your nose and throat.  Being of older age.  Being female.  Alcohol use.  Sedative and  tranquilizer use.  Ethnicity. Among individuals younger than 35 years, African Americans are at increased risk of sleep apnea. SYMPTOMS   Difficulty staying asleep.  Daytime sleepiness and fatigue.  Loss of energy.  Irritability.  Loud, heavy snoring.  Morning headaches.  Trouble concentrating.  Forgetfulness.  Decreased interest in sex.  Unexplained sleepiness. DIAGNOSIS  In order to diagnose sleep apnea, your caregiver will perform a physical examination. A sleep study done in the comfort of your own home may be appropriate if you are otherwise healthy. Your caregiver may also recommend that you spend the night in a sleep lab. In the sleep lab, several monitors record information about your heart, lungs, and brain while you sleep. Your leg and arm movements and blood oxygen level are also recorded. TREATMENT The following actions may help to resolve mild sleep apnea:  Sleeping on your side.   Using a decongestant if you have nasal congestion.   Avoiding the use of depressants, including alcohol, sedatives, and narcotics.   Losing weight and modifying your diet if you are overweight. There also are devices and treatments to help open your airway:  Oral appliances. These are custom-made mouthpieces that shift your lower jaw forward and slightly open your bite. This opens your airway.  Devices that create positive airway pressure. This positive pressure "splints" your airway open to help you breathe better during sleep. The following devices create positive airway pressure:  Continuous positive airway pressure (CPAP) device. The CPAP device creates a continuous level of air pressure with an air pump. The air is delivered to your airway through a mask while  you sleep. This continuous pressure keeps your airway open.  Nasal expiratory positive airway pressure (EPAP) device. The EPAP device creates positive air pressure as you exhale. The device consists of single-use  valves, which are inserted into each nostril and held in place by adhesive. The valves create very little resistance when you inhale but create much more resistance when you exhale. That increased resistance creates the positive airway pressure. This positive pressure while you exhale keeps your airway open, making it easier to breath when you inhale again.  Bilevel positive airway pressure (BPAP) device. The BPAP device is used mainly in patients with central sleep apnea. This device is similar to the CPAP device because it also uses an air pump to deliver continuous air pressure through a mask. However, with the BPAP machine, the pressure is set at two different levels. The pressure when you exhale is lower than the pressure when you inhale.  Surgery. Typically, surgery is only done if you cannot comply with less invasive treatments or if the less invasive treatments do not improve your condition. Surgery involves removing excess tissue in your airway to create a wider passage way.   This information is not intended to replace advice given to you by your health care provider. Make sure you discuss any questions you have with your health care provider.   Document Released: 04/25/2002 Document Revised: 05/26/2014 Document Reviewed: 09/11/2011 Elsevier Interactive Patient Education [employer].

## 2015-09-27 NOTE — Progress Notes (Signed)
Subjective:  Patient ID: Rhonda [name], female    DOB: [birth year]  Age: 54 y.o. MRN: [id number]  CC: Hypertension   HPI Rhonda [name] presents for   1. CHRONIC HYPERTENSION  Disease Monitoring  Blood pressure range: not checking   Chest pain: no   Dyspnea: no   Claudication: no   Medication compliance: yes  Medication Side Effects  Lightheadedness: no   Urinary frequency: yes   Edema: no    Preventitive Healthcare:  Exercise: no     2. Daytime somnolence: for a long time, worsening over past few months. Tired and falls asleep easily. Husband reports she snore and stops breathing during the night.   Social History  Substance Use Topics  . Smoking status: Never Smoker   . Smokeless tobacco: Not on file  . Alcohol Use: No    Outpatient Prescriptions Prior to Visit  Medication Sig Dispense Refill  . cholecalciferol (VITAMIN D) 1000 UNITS tablet Take 2,000 Units by mouth daily.    Marland Kitchen gabapentin (NEURONTIN) 300 MG capsule TAKE 2 CAPSULES BY MOUTH 3 TIMES DAILY. 180 capsule 5  . glucosamine-chondroitin 500-400 MG tablet Take 1 tablet by mouth 3 (three) times daily. 90 tablet 2  . lisinopril-hydrochlorothiazide (PRINZIDE,ZESTORETIC) 10-12.5 MG tablet Take 1 tablet by mouth daily. 30 tablet 5  . naproxen (NAPROSYN) 500 MG tablet TAKE 1 TABLET BY MOUTH 2 TIMES DAILY WITH FOOD AS NEEDED FOR MODERATE PAIN 60 tablet 0  . Omega-3 Fatty Acids (FISH OIL PO) Take by mouth.    . traMADol (ULTRAM) 50 MG tablet Take 1 tablet (50 mg total) by mouth every 6 (six) hours as needed. (Patient not taking: Reported on 08/02/2015) 60 tablet 0  . vitamin B-12 (CYANOCOBALAMIN) 1000 MCG tablet Take 1,000 mcg by mouth daily.     No facility-administered medications prior to visit.    ROS Review of Systems  Constitutional: Positive for fatigue (tired during the day, falling asleep quickly ). Negative for fever and chills.  Eyes: Negative for visual disturbance.  Respiratory: Negative for  shortness of breath.        She does snores   Cardiovascular: Negative for chest pain.  Gastrointestinal: Negative for abdominal pain and blood in stool.  Endocrine: Positive for polyuria.  Musculoskeletal: Negative for back pain and arthralgias.  Skin: Negative for rash.  Allergic/Immunologic: Negative for immunocompromised state.  Hematological: Negative for adenopathy. Does not bruise/bleed easily.  Psychiatric/Behavioral: Negative for suicidal ideas and dysphoric mood.    Objective:  BP 134/85 mmHg  Pulse 83  Temp(Src) 98.5 F (36.9 C) (Oral)  Resp 16  Ht 5\' 3"  (1.6 m)  Wt 220 lb (99.791 kg)  BMI 38.98 kg/m2  SpO2 100%  BP/Weight 09/27/2015 08/02/2015 Q000111Q  Systolic BP Q000111Q Q000111Q 0000000  Diastolic BP 85 88 88  Wt. (Lbs) 220 218 -  BMI 38.98 38.63 -   Physical Exam  Constitutional: She is oriented to person, place, and time. She appears well-developed and well-nourished. No distress.  Obese   HENT:  Head: Normocephalic and atraumatic.  Cardiovascular: Normal rate, regular rhythm, normal heart sounds and intact distal pulses.   Pulmonary/Chest: Effort normal and breath sounds normal.  Musculoskeletal: She exhibits no edema.  Neurological: She is alert and oriented to person, place, and time.  Skin: Skin is warm and dry. No rash noted.  Psychiatric: She has a normal mood and affect.    Assessment & Plan:   There are no diagnoses linked to this encounter.  Rhonda [name] was seen today for hypertension.  Diagnoses and all orders for this visit:  Daytime somnolence -     Split night study; Future  No orders of the defined types were placed in this encounter.    Follow-up: No Follow-up on file.   Boykin Nearing MD

## 2015-10-30 MED FILL — GABAPENTIN 300 MG CAPSULE: 300 | 30 days supply | Qty: 180 | Fill #1

## 2015-10-30 MED FILL — LISINOPRIL-HCTZ 10-12.5 MG: 10-12.5 | 30 days supply | Qty: 30 | Fill #1

## 2015-12-03 MED FILL — GABAPENTIN 300 MG CAPSULE: 300 | 30 days supply | Qty: 180 | Fill #2

## 2015-12-04 ENCOUNTER — Encounter (HOSPITAL_BASED_OUTPATIENT_CLINIC_OR_DEPARTMENT_OTHER): Payer: Self-pay

## 2016-01-08 MED FILL — LISINOPRIL-HCTZ 10-12.5 MG: 10-12.5 | 30 days supply | Qty: 30 | Fill #2

## 2016-01-08 MED FILL — GABAPENTIN 300 MG CAPSULE: 300 | 30 days supply | Qty: 180 | Fill #3

## 2016-02-11 MED FILL — NAPROXEN 500 MG TABLET: 500 | 30 days supply | Qty: 60 | Fill #1

## 2016-02-11 MED FILL — GABAPENTIN 300 MG CAPSULE: 300 | 30 days supply | Qty: 180 | Fill #4

## 2016-02-11 MED FILL — LISINOPRIL-HCTZ 10-12.5 MG: 10-12.5 | 30 days supply | Qty: 30 | Fill #3

## 2016-03-17 MED FILL — LISINOPRIL-HCTZ 10-12.5 MG: 10-12.5 | 30 days supply | Qty: 30 | Fill #4

## 2016-03-17 MED FILL — GABAPENTIN 300 MG CAPSULE: 300 | 30 days supply | Qty: 180 | Fill #5

## 2016-04-21 MED FILL — GABAPENTIN 300 MG CAPSULE: 300 | 30 days supply | Qty: 180 | Fill #0

## 2016-05-28 MED FILL — LISINOPRIL-HCTZ 10-12.5 MG: 10-12.5 | 30 days supply | Qty: 30 | Fill #5

## 2016-05-28 MED FILL — GABAPENTIN 300 MG CAPSULE: 300 | 30 days supply | Qty: 180 | Fill #1

## 2016-07-01 MED FILL — LISINOPRIL-HCTZ 10-12.5 MG: 10-12.5 | 30 days supply | Qty: 30 | Fill #0

## 2016-07-01 MED FILL — GABAPENTIN 300 MG CAPSULE: 300 | 30 days supply | Qty: 180 | Fill #2

## 2016-08-01 ENCOUNTER — Other Ambulatory Visit: Payer: Self-pay | Admitting: Family Medicine

## 2016-08-01 MED FILL — LISINOPRIL-HCTZ 10-12.5 MG: 10-12.5 | 30 days supply | Qty: 30 | Fill #1

## 2016-08-01 MED FILL — GABAPENTIN 300 MG CAPSULE: 300 | 30 days supply | Qty: 180 | Fill #3

## 2016-08-05 MED FILL — NAPROXEN 500 MG TABLET: 500 | 30 days supply | Qty: 60 | Fill #0

## 2016-09-03 ENCOUNTER — Other Ambulatory Visit: Payer: Self-pay | Admitting: Family Medicine

## 2016-09-03 DIAGNOSIS — G8929 Other chronic pain: Secondary | ICD-10-CM

## 2016-09-03 DIAGNOSIS — M545 Low back pain, unspecified: Secondary | ICD-10-CM

## 2016-09-04 MED FILL — GABAPENTIN 300 MG CAPSULE: 300 | 30 days supply | Qty: 180 | Fill #0

## 2016-09-25 ENCOUNTER — Ambulatory Visit: Payer: Self-pay | Attending: Family Medicine | Admitting: Family Medicine

## 2016-09-25 ENCOUNTER — Encounter: Payer: Self-pay | Admitting: Family Medicine

## 2016-09-25 VITALS — BP 129/84 | HR 81 | Temp 98.2°F | Ht 63.0 in | Wt 225.4 lb

## 2016-09-25 DIAGNOSIS — M545 Low back pain: Secondary | ICD-10-CM | POA: Insufficient documentation

## 2016-09-25 DIAGNOSIS — R51 Headache: Secondary | ICD-10-CM | POA: Insufficient documentation

## 2016-09-25 DIAGNOSIS — R400 Somnolence: Secondary | ICD-10-CM | POA: Insufficient documentation

## 2016-09-25 DIAGNOSIS — I10 Essential (primary) hypertension: Secondary | ICD-10-CM | POA: Insufficient documentation

## 2016-09-25 DIAGNOSIS — Z23 Encounter for immunization: Secondary | ICD-10-CM

## 2016-09-25 DIAGNOSIS — G8929 Other chronic pain: Secondary | ICD-10-CM | POA: Insufficient documentation

## 2016-09-25 DIAGNOSIS — Z79899 Other long term (current) drug therapy: Secondary | ICD-10-CM | POA: Insufficient documentation

## 2016-09-25 DIAGNOSIS — R531 Weakness: Secondary | ICD-10-CM | POA: Insufficient documentation

## 2016-09-25 DIAGNOSIS — R350 Frequency of micturition: Secondary | ICD-10-CM | POA: Insufficient documentation

## 2016-09-25 DIAGNOSIS — G5701 Lesion of sciatic nerve, right lower limb: Secondary | ICD-10-CM | POA: Insufficient documentation

## 2016-09-25 MED ORDER — TRAMADOL HCL 50 MG PO TABS: 50.0000 mg | ORAL_TABLET | Freq: Four times a day (QID) | ORAL | 0 refills | Status: DC | PRN

## 2016-09-25 MED ORDER — METHYLPREDNISOLONE ACETATE 40 MG/ML IJ SUSP
40.0000 mg | Freq: Once | INTRAMUSCULAR | Status: AC
  Administered 2016-09-25: 40 mg via INTRAMUSCULAR

## 2016-09-25 MED ORDER — CYCLOBENZAPRINE HCL 10 MG PO TABS: 10.0000 mg | ORAL_TABLET | Freq: Three times a day (TID) | ORAL | 0 refills | Status: DC | PRN

## 2016-09-25 MED ORDER — CYCLOBENZAPRINE HCL 10 MG PO TABS: 10.0000 mg | ORAL_TABLET | Freq: Every day | ORAL | 0 refills | Status: DC

## 2016-09-25 MED ORDER — LISINOPRIL-HYDROCHLOROTHIAZIDE 10-12.5 MG PO TABS: 1.0000 | ORAL_TABLET | Freq: Every day | ORAL | 5 refills | Status: DC

## 2016-09-25 MED FILL — LISINOPRIL-HCTZ 10-12.5 MG: 10-12.5 | 30 days supply | Qty: 30 | Fill #0

## 2016-09-25 MED FILL — traMADol HCL 50 MG TABS: 50 | 8 days supply | Qty: 30 | Fill #0

## 2016-09-25 MED FILL — CYCLOBENZAPRINE 10 MG TAB: 10 | 7 days supply | Qty: 7 | Fill #0

## 2016-09-25 NOTE — Patient Instructions (Addendum)
Rhonda [name] was seen today for hypertension.  Diagnoses and all orders for this visit:  Essential hypertension -     lisinopril-hydrochlorothiazide (PRINZIDE,ZESTORETIC) 10-12.5 MG tablet; Take 1 tablet by mouth daily. -     CMP14+EGFR  Daytime somnolence -     Split night study; Future  Piriformis syndrome of right side -     methylPREDNISolone acetate (DEPO-MEDROL) injection 40 mg; Inject 1 mL (40 mg total) into the muscle once. -     Discontinue: cyclobenzaprine (FLEXERIL) 10 MG tablet; Take 1 tablet (10 mg total) by mouth 3 (three) times daily as needed for muscle spasms. -     traMADol (ULTRAM) 50 MG tablet; Take 1 tablet (50 mg total) by mouth every 6 (six) hours as needed. -     cyclobenzaprine (FLEXERIL) 10 MG tablet; Take 1 tablet (10 mg total) by mouth at bedtime. For 7 nights   Please call Rhonda [name], [phone],  with the BCCCP (breast and cervical cancer control program) at the [facility] to set up an appointment to verify eligibility for a breast exam, mammogram, ultrasound. If you qualify this will be set up at [facility].   f/u in   Dr. Adrian Blackwater

## 2016-09-25 NOTE — Progress Notes (Signed)
Subjective:  Patient ID: Rhonda [name], female    DOB: 03-04-1961  Age: 56 y.o. MRN: [id number]  CC: Hypertension   HPI Rhonda [name] presents for   1. CHRONIC HYPERTENSION  Disease Monitoring  Blood pressure range: not checking   Chest pain: no   Dyspnea: no   Claudication: no   Medication compliance: yes  Medication Side Effects  Lightheadedness: no   Urinary frequency: yes   Edema: no    Preventitive Healthcare:  Exercise: no     2. R sided low back pain: she has history of chronic low back pain with symptoms on both sides but predominantly on the R. She reports that her pain  flared up about 1 month ago. Symptoms started after sitting on metal bench for 3-4 hrs at her granddaughter' track meet.  Pain radiates down to her R leg. She feels some weakness. . She takes ibuprofen 3-4 times per week over the last month. She has been taking tramadol at night about 1 time per week. Current pain level is 3/10.   3. Daytime somnolence: she reports continue daytime somnolence. She does snore. She has mild headaches sometimes, not daily. She feels like she sleeps well.   Social History  Substance Use Topics  . Smoking status: Never Smoker  . Smokeless tobacco: Never Used  . Alcohol use No    Outpatient Medications Prior to Visit  Medication Sig Dispense Refill  . cholecalciferol (VITAMIN D) 1000 UNITS tablet Take 2,000 Units by mouth daily.    Marland Kitchen gabapentin (NEURONTIN) 300 MG capsule TAKE 2 CAPSULES BY MOUTH 3 TIMES DAILY. 180 capsule 0  . glucosamine-chondroitin 500-400 MG tablet Take 1 tablet by mouth 3 (three) times daily. 90 tablet 2  . lisinopril-hydrochlorothiazide (PRINZIDE,ZESTORETIC) 10-12.5 MG tablet Take 1 tablet by mouth daily. 30 tablet 5  . naproxen (NAPROSYN) 500 MG tablet TAKE 1 TABLET BY MOUTH 2 TIMES DAILY WITH FOOD AS NEEDED FOR MODERATE PAIN 60 tablet 0  . Omega-3 Fatty Acids (FISH OIL PO) Take by mouth.    . traMADol (ULTRAM) 50 MG tablet Take 1 tablet (50  mg total) by mouth every 6 (six) hours as needed. 60 tablet 0  . vitamin B-12 (CYANOCOBALAMIN) 1000 MCG tablet Take 1,000 mcg by mouth daily.     No facility-administered medications prior to visit.     ROS Review of Systems  Constitutional: Positive for fatigue (tired during the day, falling asleep quickly ). Negative for chills and fever.  Eyes: Negative for visual disturbance.  Respiratory: Negative for shortness of breath.        She does snores   Cardiovascular: Negative for chest pain.  Gastrointestinal: Negative for abdominal pain and blood in stool.  Endocrine: Negative for polyuria.  Musculoskeletal: Positive for back pain. Negative for arthralgias.  Skin: Negative for rash.  Allergic/Immunologic: Negative for immunocompromised state.  Hematological: Negative for adenopathy. Does not bruise/bleed easily.  Psychiatric/Behavioral: Negative for dysphoric mood and suicidal ideas.    Objective:  BP 129/84   Pulse 81   Temp 98.2 F (36.8 C) (Oral)   Ht '5\' 3"'$  (1.6 m)   Wt 225 lb 6.4 oz (102.2 kg)   SpO2 97%   BMI 39.93 kg/m   BP/Weight 09/25/2016 09/27/2015 7/86/7672  Systolic BP 094 709 628  Diastolic BP 84 85 88  Wt. (Lbs) 225.4 220 218  BMI 39.93 38.98 38.63   Physical Exam  Constitutional: She is oriented to person, place, and time. She appears  well-developed and well-nourished. No distress.  Obese   HENT:  Head: Normocephalic and atraumatic.  Cardiovascular: Normal rate, regular rhythm, normal heart sounds and intact distal pulses.   Pulmonary/Chest: Effort normal and breath sounds normal.  Musculoskeletal: She exhibits no edema.       Arms: Neurological: She is alert and oriented to person, place, and time.  Skin: Skin is warm and dry. No rash noted.  Psychiatric: She has a normal mood and affect.     Chemistry      Component Value Date/Time   NA 143 09/25/2016 1535   K 4.1 09/25/2016 1535   CL 102 09/25/2016 1535   CO2 25 09/25/2016 1535   BUN 16  09/25/2016 1535   CREATININE 0.91 09/25/2016 1535   CREATININE 0.85 08/02/2015 1710      Component Value Date/Time   CALCIUM 9.6 09/25/2016 1535   ALKPHOS 76 09/25/2016 1535   AST 77 (H) 09/25/2016 1535   ALT 39 (H) 09/25/2016 1535   BILITOT <0.2 09/25/2016 1535      Assessment & Plan:  Jhoana was seen today for hypertension.  Diagnoses and all orders for this visit:  Essential hypertension -     lisinopril-hydrochlorothiazide (PRINZIDE,ZESTORETIC) 10-12.5 MG tablet; Take 1 tablet by mouth daily. -     CMP14+EGFR  Daytime somnolence -     Split night study; Future  Piriformis syndrome of right side -     methylPREDNISolone acetate (DEPO-MEDROL) injection 40 mg; Inject 1 mL (40 mg total) into the muscle once. -     Discontinue: cyclobenzaprine (FLEXERIL) 10 MG tablet; Take 1 tablet (10 mg total) by mouth 3 (three) times daily as needed for muscle spasms. -     Discontinue: traMADol (ULTRAM) 50 MG tablet; Take 1 tablet (50 mg total) by mouth every 6 (six) hours as needed. -     cyclobenzaprine (FLEXERIL) 10 MG tablet; Take 1 tablet (10 mg total) by mouth at bedtime. For 7 nights -     traMADol (ULTRAM) 50 MG tablet; Take 1 tablet (50 mg total) by mouth every 6 (six) hours as needed.  Other orders -     Tdap vaccine greater than or equal to 7yo IM   There are no diagnoses linked to this encounter. There are no diagnoses linked to this encounter. No orders of the defined types were placed in this encounter.   Follow-up: Return in about 6 months (around 03/28/2017) for HTN, sooner if needed .   Boykin Nearing MD

## 2016-09-26 LAB — CMP14+EGFR
ALT: 39 IU/L — ABNORMAL HIGH (ref 0–32)
AST: 77 IU/L — ABNORMAL HIGH (ref 0–40)
Albumin/Globulin Ratio: 1.3 (ref 1.2–2.2)
Albumin: 3.9 g/dL (ref 3.5–5.5)
Alkaline Phosphatase: 76 IU/L (ref 39–117)
BUN/Creatinine Ratio: 18 (ref 9–23)
BUN: 16 mg/dL (ref 6–24)
Bilirubin Total: <0.2 mg/dL (ref 0.0–1.2)
CO2: 25 mmol/L (ref 18–29)
Calcium: 9.6 mg/dL (ref 8.7–10.2)
Chloride: 102 mmol/L (ref 96–106)
Creatinine, Ser: 0.91 mg/dL (ref 0.57–1.00)
GFR calc Af Amer: 82 mL/min/1.73
GFR calc non Af Amer: 71 mL/min/1.73
Globulin, Total: 3 g/dL (ref 1.5–4.5)
Glucose: 83 mg/dL (ref 65–99)
Potassium: 4.1 mmol/L (ref 3.5–5.2)
Sodium: 143 mmol/L (ref 134–144)
Total Protein: 6.9 g/dL (ref 6.0–8.5)

## 2016-09-29 DIAGNOSIS — G5701 Lesion of sciatic nerve, right lower limb: Secondary | ICD-10-CM | POA: Insufficient documentation

## 2016-09-29 NOTE — Assessment & Plan Note (Signed)
Well controlled Continue current prinzide Check metabolic panel

## 2016-09-29 NOTE — Assessment & Plan Note (Signed)
Obese female with chronic low back and R hip pain that has worsened following sitting on a metal bench History and exam consistent with piriformis syndrome  Plan: Depo medrol 40 mg IM x one  Flexeril 8 mg qhs for next week Refilled tramadol which patient uses very sparingly Discussed preventative measures

## 2016-09-29 NOTE — Assessment & Plan Note (Signed)
Obese female with daytime somnolence Plan: Weight loss Sleep study recommended and ordered unfortunately she is uninsured and cannot afford the study right now

## 2016-10-01 ENCOUNTER — Encounter: Payer: Self-pay | Admitting: Family Medicine

## 2016-10-01 NOTE — Progress Notes (Signed)
No results are typed for this patient.

## 2016-10-09 ENCOUNTER — Other Ambulatory Visit: Payer: Self-pay | Admitting: Family Medicine

## 2016-10-09 DIAGNOSIS — G8929 Other chronic pain: Secondary | ICD-10-CM

## 2016-10-09 DIAGNOSIS — M545 Low back pain: Principal | ICD-10-CM

## 2016-10-09 MED FILL — GABAPENTIN 300 MG CAPSULE: 300 | 30 days supply | Qty: 180 | Fill #0

## 2016-10-10 ENCOUNTER — Telehealth (HOSPITAL_COMMUNITY): Payer: Self-pay | Admitting: *Deleted

## 2016-10-10 NOTE — Telephone Encounter (Signed)
Telephoned patient and left message to return call to [facility]

## 2016-11-12 ENCOUNTER — Other Ambulatory Visit: Payer: Self-pay | Admitting: Family Medicine

## 2016-11-12 DIAGNOSIS — M545 Low back pain, unspecified: Secondary | ICD-10-CM

## 2016-11-12 DIAGNOSIS — G8929 Other chronic pain: Secondary | ICD-10-CM

## 2016-11-12 MED FILL — GABAPENTIN 300 MG CAPSULE: 300 | 30 days supply | Qty: 180 | Fill #0

## 2016-11-20 ENCOUNTER — Other Ambulatory Visit: Payer: Self-pay | Admitting: Obstetrics and Gynecology

## 2016-11-20 DIAGNOSIS — Z1231 Encounter for screening mammogram for malignant neoplasm of breast: Secondary | ICD-10-CM

## 2016-12-04 ENCOUNTER — Ambulatory Visit (HOSPITAL_COMMUNITY)
Admission: RE | Admit: 2016-12-04 | Discharge: 2016-12-04 | Disposition: A | Discharge status: Home / Self Care | Payer: Self-pay | Source: Ambulatory Visit | Attending: Obstetrics and Gynecology | Admitting: Obstetrics and Gynecology

## 2016-12-04 ENCOUNTER — Encounter (HOSPITAL_COMMUNITY): Payer: Self-pay

## 2016-12-04 ENCOUNTER — Ambulatory Visit
Admission: RE | Admit: 2016-12-04 | Discharge: 2016-12-04 | Disposition: A | Discharge status: Home / Self Care | Payer: No Typology Code available for payment source | Source: Ambulatory Visit | Attending: Obstetrics and Gynecology | Admitting: Obstetrics and Gynecology

## 2016-12-04 VITALS — BP 196/108 | Temp 98.6°F | Ht 63.0 in | Wt 220.2 lb

## 2016-12-04 DIAGNOSIS — Z1239 Encounter for other screening for malignant neoplasm of breast: Secondary | ICD-10-CM

## 2016-12-04 DIAGNOSIS — Z1231 Encounter for screening mammogram for malignant neoplasm of breast: Secondary | ICD-10-CM

## 2016-12-04 NOTE — Progress Notes (Signed)
No complaints today.   Pap Smear: Pap smear not completed today. Last Pap smear was 02/26/2009 and normal. Per patient has no history of an abnormal Pap smear. Patient has a history of a hysterectomy in 1998 for fibroids. Patient no longer needs Pap smears due to her history of a hysterectomy for benign reasons. Last Pap smear result is in EPIC.  Physical exam: Breasts Breasts symmetrical. No skin abnormalities bilateral breasts. No nipple retraction bilateral breasts. No nipple discharge bilateral breasts. No lymphadenopathy. No lumps palpated bilateral breasts. No complaints of pain or tenderness on exam. Referred patient to the [facility] for a screening mammogram. Appointment scheduled for Thursday, December 04, 2016 at 1420.        Pelvic/Bimanual No Pap smear completed today since patient has a history of a hysterectomy for benign reasons. Pap smear not indicated per BCCCP guidelines.   Smoking History: Patient has never smoked.  Patient Navigation: Patient education provided. Access to services provided for patient through [place] program.   Colorectal Cancer Screening: Per patient has never had a colonoscopy completed. No complaints today. FIT Test given to patient to complete and return to BCCCP.

## 2016-12-04 NOTE — Patient Instructions (Signed)
Explained breast self awareness with Rhonda [name]. Patient did not need a Pap smear today due to her history of a hysterectomy for benign reasons. Let patient know that she doesn't need any further Pap smears due to her history of a hysterectomy for benign reasons. Referred patient to the [facility] for a screening mammogram. Appointment scheduled for Thursday, December 04, 2016 at 1420. Let [facility] know [facility] will follow up with her within the next couple weeks with results of mammogram by letter or phone. Rhonda [name] verbalized understanding.  Aletheia Tangredi, Arvil Chaco, RN 2:19 PM

## 2016-12-05 ENCOUNTER — Encounter (HOSPITAL_COMMUNITY): Payer: Self-pay | Admitting: *Deleted

## 2016-12-05 ENCOUNTER — Other Ambulatory Visit: Payer: Self-pay | Admitting: Obstetrics and Gynecology

## 2016-12-09 ENCOUNTER — Other Ambulatory Visit: Payer: Self-pay | Admitting: Obstetrics and Gynecology

## 2016-12-09 DIAGNOSIS — R928 Other abnormal and inconclusive findings on diagnostic imaging of breast: Secondary | ICD-10-CM

## 2016-12-10 LAB — FECAL OCCULT BLOOD, IMMUNOCHEMICAL: FECAL OCCULT BLD: NEGATIVE

## 2016-12-11 ENCOUNTER — Ambulatory Visit (HOSPITAL_BASED_OUTPATIENT_CLINIC_OR_DEPARTMENT_OTHER): Payer: No Typology Code available for payment source | Attending: Family Medicine | Admitting: Internal Medicine

## 2016-12-11 DIAGNOSIS — R400 Somnolence: Secondary | ICD-10-CM

## 2016-12-12 ENCOUNTER — Encounter (HOSPITAL_COMMUNITY): Payer: Self-pay | Admitting: *Deleted

## 2016-12-12 ENCOUNTER — Ambulatory Visit
Admission: RE | Admit: 2016-12-12 | Discharge: 2016-12-12 | Disposition: A | Discharge status: Home / Self Care | Payer: No Typology Code available for payment source | Source: Ambulatory Visit | Attending: Obstetrics and Gynecology | Admitting: Obstetrics and Gynecology

## 2016-12-12 ENCOUNTER — Other Ambulatory Visit (HOSPITAL_BASED_OUTPATIENT_CLINIC_OR_DEPARTMENT_OTHER): Payer: Self-pay

## 2016-12-12 ENCOUNTER — Other Ambulatory Visit: Payer: Self-pay | Admitting: Family Medicine

## 2016-12-12 DIAGNOSIS — R928 Other abnormal and inconclusive findings on diagnostic imaging of breast: Secondary | ICD-10-CM

## 2016-12-12 DIAGNOSIS — N6320 Unspecified lump in the left breast, unspecified quadrant: Secondary | ICD-10-CM

## 2016-12-12 DIAGNOSIS — R400 Somnolence: Secondary | ICD-10-CM

## 2016-12-12 NOTE — Progress Notes (Signed)
Letter mailed to patient with negative Fit Test results.  

## 2016-12-15 ENCOUNTER — Other Ambulatory Visit: Payer: Self-pay | Admitting: Obstetrics and Gynecology

## 2016-12-15 DIAGNOSIS — N6320 Unspecified lump in the left breast, unspecified quadrant: Secondary | ICD-10-CM

## 2016-12-15 MED FILL — LISINOPRIL-HCTZ 10-12.5 MG: 10-12.5 | 30 days supply | Qty: 30 | Fill #1

## 2016-12-15 MED FILL — GABAPENTIN 300 MG CAPSULE: 300 | 30 days supply | Qty: 180 | Fill #1

## 2016-12-21 DIAGNOSIS — R400 Somnolence: Secondary | ICD-10-CM

## 2016-12-21 NOTE — Procedures (Signed)
Patient Name: Rhonda [name], Rhonda [name] Date: 12/11/2016 Gender: Female D.O.B: September 06, 1960 Age (years): 47 Referring Provider: Adriana Mccallum Funches Height (inches): 63 Interpreting Physician: Baird Lyons MD, ABSM Weight (lbs): 220 RPSGT: Earney Hamburg BMI: 39 MRN: [id number] Neck Size: 16.00 CLINICAL INFORMATION Sleep Study Type: Split Night CPAP  Indication for sleep study: Excessive Daytime Sleepiness  Epworth Sleepiness Score: 13  SLEEP STUDY TECHNIQUE As per the AASM Manual for the Scoring of Sleep and Associated Events v2.3 (April 2016) with a hypopnea requiring 4% desaturations.  The channels recorded and monitored were frontal, central and occipital EEG, electrooculogram (EOG), submentalis EMG (chin), nasal and oral airflow, thoracic and abdominal wall motion, anterior tibialis EMG, snore microphone, electrocardiogram, and pulse oximetry. Continuous positive airway pressure (CPAP) was initiated when the patient met split night criteria and was titrated according to treat sleep-disordered breathing.  MEDICATIONS Medications self-administered by patient taken the night of the study : FISH OIL, LISINOPRIL/HCTZ, VINEGAR PILL  RESPIRATORY PARAMETERS Diagnostic  Total AHI (/hr): 42.9 RDI (/hr): 43.8 OA Index (/hr): 5.1 CA Index (/hr): 0.0 REM AHI (/hr): N/A NREM AHI (/hr): 42.9 Supine AHI (/hr): 42.9 Non-supine AHI (/hr): N/A Min O2 Sat (%): 0.00 Mean O2 (%): 91.45 Time below 88% (min): 21.1   Titration  Optimal Pressure (cm): 13 AHI at Optimal Pressure (/hr): 0.0 Min O2 at Optimal Pressure (%): 92.0 Supine % at Optimal (%): 100 Sleep % at Optimal (%): 95   SLEEP ARCHITECTURE The recording time for the entire night was 373.1 minutes.  During a baseline period of 158.4 minutes, the patient slept for 130.0 minutes in REM and nonREM, yielding a sleep efficiency of 82.1%. Sleep onset after lights out was 13.8 minutes with a REM latency of N/A minutes. The patient spent 3.08% of  the night in stage N1 sleep, 96.92% in stage N2 sleep, 0.00% in stage N3 and 0.00% in REM.  During the titration period of 202.1 minutes, the patient slept for 171.3 minutes in REM and nonREM, yielding a sleep efficiency of 84.8%. Sleep onset after CPAP initiation was 27.2 minutes with a REM latency of 22.0 minutes. The patient spent 0.58% of the night in stage N1 sleep, 62.65% in stage N2 sleep, 0.00% in stage N3 and 36.77% in REM.  CARDIAC DATA The 2 lead EKG demonstrated sinus rhythm. The mean heart rate was 75.17 beats per minute. Other EKG findings include: None.  LEG MOVEMENT DATA The total Periodic Limb Movements of Sleep (PLMS) were 0. The PLMS index was 0.00 .  IMPRESSIONS - Severe obstructive sleep apnea occurred during the diagnostic portion of the study (AHI = 42.9/hour). An optimal PAP pressure was selected for this patient ( 13 cm of water) - No significant central sleep apnea occurred during the diagnostic portion of the study (CAI = 0.0/hour). - Moderate oxygen desaturation was noted during the diagnostic portion of the study (Mean 91.5%). - The patient snored with Loud snoring volume during the diagnostic portion of the study. - No cardiac abnormalities were noted during this study. - Clinically significant periodic limb movements did not occur during sleep.  DIAGNOSIS - Obstructive Sleep Apnea (327.23 [G47.33 ICD-10])  RECOMMENDATIONS - Trial of CPAP therapy on 13 cm H2O with a Medium size Fisher&Paykel Nasal Mask Eson mask and heated humidification. - Sleep hygiene should be reviewed to assess factors that may improve sleep quality. - Weight management and regular exercise should be initiated or continued.  [Electronically signed] 12/21/2016 02:14 PM  Baird Lyons MD, ABSM Diplomate, American  Board of Sleep Medicine   NPI: [id number]  [place], American Board of Sleep Medicine  ELECTRONICALLY SIGNED ON:  12/21/2016, 2:12 PM [place] PH: [phone]   FX: [phone] [place]

## 2016-12-25 ENCOUNTER — Telehealth: Payer: Self-pay

## 2016-12-25 ENCOUNTER — Other Ambulatory Visit: Payer: Self-pay | Admitting: Family Medicine

## 2016-12-25 DIAGNOSIS — G4733 Obstructive sleep apnea (adult) (pediatric): Secondary | ICD-10-CM

## 2016-12-25 NOTE — Telephone Encounter (Signed)
Pt was called and informed to contact office for lab results. 

## 2016-12-25 NOTE — Assessment & Plan Note (Signed)
Sleep study reviewed Severe OSA - Trial of CPAP therapy on 13 cm H2O with a Medium size  Fisher&Paykel Nasal Mask Eson mask and heated humidification.

## 2016-12-26 ENCOUNTER — Telehealth: Payer: Self-pay

## 2016-12-26 NOTE — Telephone Encounter (Signed)
Attempted to contact the patient to discuss ordering the CPAP machine from [employer]. Calls placed to # [phone] and # [phone] and HIPAA compliant voicemail messages were left at each # requesting a call back to # [phone].

## 2016-12-26 NOTE — Telephone Encounter (Signed)
Pt was called and informed of lab results. 

## 2016-12-26 NOTE — Telephone Encounter (Signed)
Pt. Returned nurse call regarding results. Please f/u with pt.  °

## 2016-12-29 ENCOUNTER — Telehealth: Payer: Self-pay | Admitting: Family Medicine

## 2016-12-29 NOTE — Telephone Encounter (Signed)
Call placed to patient [phone], regardingher CPAP application. Spoke with patient and informed her that we had received a referral to the [employer] for a CPAP. Informed patient this organization helps patients get a CPAP that is refurbished at a low cost ($100). 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 . Explained to patient that we can help her with it. Also, informed her that I will be sending the organization her application and the process will take 2-3 weeks. I will follow up with her.

## 2016-12-30 NOTE — Telephone Encounter (Signed)
Call placed to the [employer] [phone], to check on the status of the patient's application for a CPAP machine  that was faxed yesterday at 11:42am and to make a payment. No answer. Left a message for a call back [phone].

## 2016-12-31 ENCOUNTER — Telehealth: Payer: Self-pay

## 2016-12-31 ENCOUNTER — Ambulatory Visit: Payer: Medicaid Other | Attending: Internal Medicine

## 2016-12-31 NOTE — Telephone Encounter (Signed)
Call received from [facility] with the [employer] and $100 donation was made by [facility] for the patient's CPAP machine.

## 2017-01-12 ENCOUNTER — Telehealth: Payer: Self-pay

## 2017-01-12 NOTE — Telephone Encounter (Signed)
CPAP machine delivered. This CM spoke to Baxter Flattery with the [facility] Respiratory Therapy Department and scheduled a time for CPAP teaching on 01/16/17 @ 1030.  Attempted to contact the patient to inform her of the CPAP teaching. Call placed to # [phone] (M) and a HIPAA compliant voicemail message was left requesting a call back to # [phone].  Call also placed to #  [phone] (H) x3 and each time the message stated that the call was not able to be completed at this time

## 2017-01-12 NOTE — Telephone Encounter (Signed)
Pt contacted the office and stated Rhonda [name] contacted her gave informations regarding CPAP teaching. Pt doesn't have any questions or concerns

## 2017-01-13 ENCOUNTER — Telehealth: Payer: Self-pay

## 2017-01-13 NOTE — Telephone Encounter (Signed)
Attempted to contact the patient to confirm her CPAP teaching for 01/16/17 @ 1030. Call placed to # [phone] (M) and a HIPAA compliant voicemail message was left requesting a call back to # [phone]. Call also placed to #  [phone] (H) and the message continues to state that the call can't be completed at this time.

## 2017-01-14 ENCOUNTER — Telehealth: Payer: Self-pay

## 2017-01-14 NOTE — Telephone Encounter (Signed)
Call received from the patient and confirmed her CPAP teaching at [facility] on 01/16/17 @ 1030

## 2017-01-16 ENCOUNTER — Telehealth: Payer: Self-pay

## 2017-01-16 MED FILL — GABAPENTIN 300 MG CAPSULE: 300 | 30 days supply | Qty: 180 | Fill #2

## 2017-01-16 NOTE — Telephone Encounter (Signed)
CPAP teaching completed by Cristie Hem, RT from [facility]. The patient was instructed regarding the use and care of the CPAP machine and was given her machine at the end of the instruction.

## 2017-01-17 ENCOUNTER — Other Ambulatory Visit: Payer: Self-pay | Admitting: Family Medicine

## 2017-01-22 ENCOUNTER — Other Ambulatory Visit: Payer: Self-pay | Admitting: Family Medicine

## 2017-02-17 ENCOUNTER — Other Ambulatory Visit: Payer: Self-pay | Admitting: Family Medicine

## 2017-02-17 DIAGNOSIS — G8929 Other chronic pain: Secondary | ICD-10-CM

## 2017-02-17 DIAGNOSIS — M545 Low back pain: Principal | ICD-10-CM

## 2017-02-17 MED FILL — GABAPENTIN 300 MG CAPSULE: 300 | 30 days supply | Qty: 180 | Fill #0

## 2017-02-17 MED FILL — LISINOPRIL-HCTZ 10-12.5 MG: 10-12.5 | 30 days supply | Qty: 30 | Fill #2

## 2017-03-23 ENCOUNTER — Other Ambulatory Visit: Payer: Self-pay | Admitting: Internal Medicine

## 2017-03-23 DIAGNOSIS — M545 Low back pain, unspecified: Secondary | ICD-10-CM

## 2017-03-23 DIAGNOSIS — G8929 Other chronic pain: Secondary | ICD-10-CM

## 2017-04-03 ENCOUNTER — Encounter: Payer: Self-pay | Admitting: Nurse Practitioner

## 2017-04-03 ENCOUNTER — Ambulatory Visit: Payer: Self-pay | Attending: Nurse Practitioner | Admitting: Nurse Practitioner

## 2017-04-03 VITALS — BP 148/77 | HR 71 | Temp 98.1°F | Resp 18 | Ht 63.0 in | Wt 214.8 lb

## 2017-04-03 DIAGNOSIS — Z23 Encounter for immunization: Secondary | ICD-10-CM | POA: Insufficient documentation

## 2017-04-03 DIAGNOSIS — Z8249 Family history of ischemic heart disease and other diseases of the circulatory system: Secondary | ICD-10-CM | POA: Insufficient documentation

## 2017-04-03 DIAGNOSIS — Z880 Allergy status to penicillin: Secondary | ICD-10-CM | POA: Insufficient documentation

## 2017-04-03 DIAGNOSIS — E669 Obesity, unspecified: Secondary | ICD-10-CM | POA: Insufficient documentation

## 2017-04-03 DIAGNOSIS — M545 Low back pain: Secondary | ICD-10-CM | POA: Insufficient documentation

## 2017-04-03 DIAGNOSIS — M1990 Unspecified osteoarthritis, unspecified site: Secondary | ICD-10-CM | POA: Insufficient documentation

## 2017-04-03 DIAGNOSIS — Z90710 Acquired absence of both cervix and uterus: Secondary | ICD-10-CM | POA: Insufficient documentation

## 2017-04-03 DIAGNOSIS — Z833 Family history of diabetes mellitus: Secondary | ICD-10-CM | POA: Insufficient documentation

## 2017-04-03 DIAGNOSIS — G8929 Other chronic pain: Secondary | ICD-10-CM | POA: Insufficient documentation

## 2017-04-03 DIAGNOSIS — I10 Essential (primary) hypertension: Secondary | ICD-10-CM | POA: Insufficient documentation

## 2017-04-03 DIAGNOSIS — Z9889 Other specified postprocedural states: Secondary | ICD-10-CM | POA: Insufficient documentation

## 2017-04-03 DIAGNOSIS — Z886 Allergy status to analgesic agent status: Secondary | ICD-10-CM | POA: Insufficient documentation

## 2017-04-03 DIAGNOSIS — Z809 Family history of malignant neoplasm, unspecified: Secondary | ICD-10-CM | POA: Insufficient documentation

## 2017-04-03 DIAGNOSIS — G5701 Lesion of sciatic nerve, right lower limb: Secondary | ICD-10-CM

## 2017-04-03 DIAGNOSIS — Z6838 Body mass index (BMI) 38.0-38.9, adult: Secondary | ICD-10-CM | POA: Insufficient documentation

## 2017-04-03 DIAGNOSIS — Z79899 Other long term (current) drug therapy: Secondary | ICD-10-CM | POA: Insufficient documentation

## 2017-04-03 DIAGNOSIS — R7303 Prediabetes: Secondary | ICD-10-CM | POA: Insufficient documentation

## 2017-04-03 LAB — POCT GLYCOSYLATED HEMOGLOBIN (HGB A1C): Hemoglobin A1C: 6.2

## 2017-04-03 LAB — GLUCOSE, POCT (MANUAL RESULT ENTRY): POC Glucose: 87 mg/dl (ref 70–99)

## 2017-04-03 MED ORDER — LISINOPRIL-HYDROCHLOROTHIAZIDE 10-12.5 MG PO TABS: 1.0000 | ORAL_TABLET | Freq: Every day | ORAL | 1 refills | Status: DC

## 2017-04-03 MED ORDER — GABAPENTIN 300 MG PO CAPS: ORAL_CAPSULE | ORAL | 1 refills | Status: DC

## 2017-04-03 MED ORDER — NAPROXEN 500 MG PO TABS: ORAL_TABLET | ORAL | 1 refills | Status: DC

## 2017-04-03 NOTE — Patient Instructions (Addendum)
DASH Eating Plan DASH stands for "Dietary Approaches to Stop Hypertension." The DASH eating plan is a healthy eating plan that has been shown to reduce high blood pressure (hypertension). It may also reduce your risk for type 2 diabetes, heart disease, and stroke. The DASH eating plan may also help with weight loss. What are tips for following this plan? General guidelines  Avoid eating more than 2,300 mg (milligrams) of salt (sodium) a day. If you have hypertension, you may need to reduce your sodium intake to 1,500 mg a day.  Limit alcohol intake to no more than 1 drink a day for nonpregnant women and 2 drinks a day for men. One drink equals 12 oz of beer, 5 oz of wine, or 1 oz of hard liquor.  Work with your health care provider to maintain a healthy body weight or to lose weight. Ask what an ideal weight is for you.  Get at least 30 minutes of exercise that causes your heart to beat faster (aerobic exercise) most days of the week. Activities may include walking, swimming, or biking.  Work with your health care provider or diet and nutrition specialist (dietitian) to adjust your eating plan to your individual calorie needs. Reading food labels  Check food labels for the amount of sodium per serving. Choose foods with less than 5 percent of the Daily Value of sodium. Generally, foods with less than 300 mg of sodium per serving fit into this eating plan.  To find whole grains, look for the word "whole" as the first word in the ingredient list. Shopping  Buy products labeled as "low-sodium" or "no salt added."  Buy fresh foods. Avoid canned foods and premade or frozen meals. Cooking  Avoid adding salt when cooking. Use salt-free seasonings or herbs instead of table salt or sea salt. Check with your health care provider or pharmacist before using salt substitutes.  Do not fry foods. Cook foods using healthy methods such as baking, boiling, grilling, and broiling instead.  Cook with  heart-healthy oils, such as olive, canola, soybean, or sunflower oil. Meal planning   Eat a balanced diet that includes: ? 5 or more servings of fruits and vegetables each day. At each meal, try to fill half of your plate with fruits and vegetables. ? Up to 6-8 servings of whole grains each day. ? Less than 6 oz of lean meat, poultry, or fish each day. A 3-oz serving of meat is about the same size as a deck of cards. One egg equals 1 oz. ? 2 servings of low-fat dairy each day. ? A serving of nuts, seeds, or beans 5 times each week. ? Heart-healthy fats. Healthy fats called Omega-3 fatty acids are found in foods such as flaxseeds and coldwater fish, like sardines, salmon, and mackerel.  Limit how much you eat of the following: ? Canned or prepackaged foods. ? Food that is high in trans fat, such as fried foods. ? Food that is high in saturated fat, such as fatty meat. ? Sweets, desserts, sugary drinks, and other foods with added sugar. ? Full-fat dairy products.  Do not salt foods before eating.  Try to eat at least 2 vegetarian meals each week.  Eat more home-cooked food and less restaurant, buffet, and fast food.  When eating at a restaurant, ask that your food be prepared with less salt or no salt, if possible. What foods are recommended? The items listed may not be a complete list. Talk with your dietitian about what   dietary choices are best for you. Grains Whole-grain or whole-wheat bread. Whole-grain or whole-wheat pasta. Brown rice. Oatmeal. Quinoa. Bulgur. Whole-grain and low-sodium cereals. Pita bread. Low-fat, low-sodium crackers. Whole-wheat flour tortillas. Vegetables Fresh or frozen vegetables (raw, steamed, roasted, or grilled). Low-sodium or reduced-sodium tomato and vegetable juice. Low-sodium or reduced-sodium tomato sauce and tomato paste. Low-sodium or reduced-sodium canned vegetables. Fruits All fresh, dried, or frozen fruit. Canned fruit in natural juice (without  added sugar). Meat and other protein foods Skinless chicken or turkey. Ground chicken or turkey. Pork with fat trimmed off. Fish and seafood. Egg whites. Dried beans, peas, or lentils. Unsalted nuts, nut butters, and seeds. Unsalted canned beans. Lean cuts of beef with fat trimmed off. Low-sodium, lean deli meat. Dairy Low-fat (1%) or fat-free (skim) milk. Fat-free, low-fat, or reduced-fat cheeses. Nonfat, low-sodium ricotta or cottage cheese. Low-fat or nonfat yogurt. Low-fat, low-sodium cheese. Fats and oils Soft margarine without trans fats. Vegetable oil. Low-fat, reduced-fat, or light mayonnaise and salad dressings (reduced-sodium). Canola, safflower, olive, soybean, and sunflower oils. Avocado. Seasoning and other foods Herbs. Spices. Seasoning mixes without salt. Unsalted popcorn and pretzels. Fat-free sweets. What foods are not recommended? The items listed may not be a complete list. Talk with your dietitian about what dietary choices are best for you. Grains Baked goods made with fat, such as croissants, muffins, or some breads. Dry pasta or rice meal packs. Vegetables Creamed or fried vegetables. Vegetables in a cheese sauce. Regular canned vegetables (not low-sodium or reduced-sodium). Regular canned tomato sauce and paste (not low-sodium or reduced-sodium). Regular tomato and vegetable juice (not low-sodium or reduced-sodium). Pickles. Olives. Fruits Canned fruit in a light or heavy syrup. Fried fruit. Fruit in cream or butter sauce. Meat and other protein foods Fatty cuts of meat. Ribs. Fried meat. Bacon. Sausage. Bologna and other processed lunch meats. Salami. Fatback. Hotdogs. Bratwurst. Salted nuts and seeds. Canned beans with added salt. Canned or smoked fish. Whole eggs or egg yolks. Chicken or turkey with skin. Dairy Whole or 2% milk, cream, and half-and-half. Whole or full-fat cream cheese. Whole-fat or sweetened yogurt. Full-fat cheese. Nondairy creamers. Whipped toppings.  Processed cheese and cheese spreads. Fats and oils Butter. Stick margarine. Lard. Shortening. Ghee. Bacon fat. Tropical oils, such as coconut, palm kernel, or palm oil. Seasoning and other foods Salted popcorn and pretzels. Onion salt, garlic salt, seasoned salt, table salt, and sea salt. Worcestershire sauce. Tartar sauce. Barbecue sauce. Teriyaki sauce. Soy sauce, including reduced-sodium. Steak sauce. Canned and packaged gravies. Fish sauce. Oyster sauce. Cocktail sauce. Horseradish that you find on the shelf. Ketchup. Mustard. Meat flavorings and tenderizers. Bouillon cubes. Hot sauce and Tabasco sauce. Premade or packaged marinades. Premade or packaged taco seasonings. Relishes. Regular salad dressings. Where to find more information:  National Heart, Lung, and Blood Institute: www.nhlbi.nih.gov  American Heart Association: www.heart.org Summary  The DASH eating plan is a healthy eating plan that has been shown to reduce high blood pressure (hypertension). It may also reduce your risk for type 2 diabetes, heart disease, and stroke.  With the DASH eating plan, you should limit salt (sodium) intake to 2,300 mg a day. If you have hypertension, you may need to reduce your sodium intake to 1,500 mg a day.  When on the DASH eating plan, aim to eat more fresh fruits and vegetables, whole grains, lean proteins, low-fat dairy, and heart-healthy fats.  Work with your health care provider or diet and nutrition specialist (dietitian) to adjust your eating plan to your individual   calorie needs. This information is not intended to replace advice given to you by your health care provider. Make sure you discuss any questions you have with your health care provider. Document Released: 04/24/2011 Document Revised: 04/28/2016 Document Reviewed: 04/28/2016 Elsevier Interactive Patient Education  2017 [employer].  

## 2017-04-03 NOTE — Progress Notes (Signed)
Assessment & Plan:  [name] was seen today for establish care.  Diagnoses and all orders for this visit:  Essential hypertension -     lisinopril-hydrochlorothiazide (PRINZIDE,ZESTORETIC) 10-12.5 MG tablet; Take 1 tablet daily by mouth. Controlled Continue all antihypertensives as prescribed. Remember to bring in your blood pressure log with you for your follow up appointment.  DASH DIET   Prediabetes -     Glucose (CBG) -     HgB A1c Exercise at least 150 minutes per week.  Drink plenty of water. Aim for 2-3 Carb Choices per meal (30-45 grams) +/- 1 either way  Aim for 0-15 Carbs per snack if hungry  Include protein in moderation with your meals and snacks  Consider reading food labels for Total Carbohydrate and Fat Grams of foods  Consider checking BG at alternate times per day  Continue taking medication as directed Be mindful about how much sugar you are adding to beverages and other foods. Fruit Punch - find one with no sugar  Measure and decrease portions of carbohydrate foods  Make your plate and don't go back for seconds  Chronic low back pain -     gabapentin (NEURONTIN) 300 MG capsule; TAKE 2 CAPSULES BY MOUTH 3 TIMES DAILY. -     naproxen (NAPROSYN) 500 MG tablet; TAKE 1 TABLET BY MOUTH 2 TIMES DAILY WITH FOOD AS NEEDED FOR MODERATE PAIN Avoid overuse of naproxen due to risk of GI bleed with longterm use. Patient verbalized understanding  Immunization due -     Flu Vaccine QUAD 6+ mos PF IM (Fluarix Quad PF)     Subjective:   Chief Complaint  Patient presents with  . Establish Care    Patient stated she need medication refills.    HPI Rhonda [name] 56 y.o. female presents to office today to re establish care and for follow up of HTN and prediabetes.   Hypertension: Patient here for follow-up of elevated blood pressure. She is exercising (weight has been decreasing steadily) and is adherent to low salt diet.  Blood pressure is well controlled at  home. Cardiac symptoms none. Patient denies chest pain, chest pressure/discomfort, dyspnea, exertional chest pressure/discomfort, fatigue, irregular heart beat, lower extremity edema, palpitations and paroxysmal nocturnal dyspnea.  Cardiovascular risk factors: hypertension and obesity (BMI >= 30 kg/m2). Use of agents associated with hypertension: NSAIDS. History of target organ damage: none. She checks her blood pressure at home with average 120/70s.    Back Pain: She has a history of chronic low back problems.  Symptoms have been present for several years and include numbness in lower back and pain in lower back; specifically on the right side (aching, dull, numbing and tingling in character; 5/10 in severity).  Previous workup: imaging (MRI). Previous treatments: naproxen, neurontin, tramadol. .     Past Medical History:  Diagnosis Date  . Arthritis   . Degenerative disc disease, lumbar   . Hypertension     Past Surgical History:  Procedure Laterality Date  . ABDOMINAL HYSTERECTOMY    . BREAST EXCISIONAL BIOPSY Right   . BREAST SURGERY     right breast  . CESAREAN SECTION      Family History  Problem Relation Age of Onset  . Cancer Mother   . Hypertension Sister   . Diabetes Sister   . Diabetes Maternal Grandmother     Social History   Socioeconomic History  . Marital status: Married    Spouse name: Not on file  . Number  of children: Not on file  . Years of education: Not on file  . Highest education level: Not on file  Social Needs  . Financial resource strain: Not on file  . Food insecurity - worry: Not on file  . Food insecurity - inability: Not on file  . Transportation needs - medical: Not on file  . Transportation needs - non-medical: Not on file  Occupational History  . Not on file  Tobacco Use  . Smoking status: Never Smoker  . Smokeless tobacco: Never Used  Substance and Sexual Activity  . Alcohol use: No  . Drug use: No  . Sexual activity: Yes     Birth control/protection: None  Other Topics Concern  . Not on file  Social History Narrative  . Not on file    Outpatient Medications Prior to Visit  Medication Sig Dispense Refill  . cholecalciferol (VITAMIN D) 1000 UNITS tablet Take 2,000 Units by mouth daily.    Marland Kitchen glucosamine-chondroitin 500-400 MG tablet Take 1 tablet by mouth 3 (three) times daily. 90 tablet 2  . Omega-3 Fatty Acids (FISH OIL PO) Take by mouth.    . traMADol (ULTRAM) 50 MG tablet Take 1 tablet (50 mg total) by mouth every 6 (six) hours as needed. 30 tablet 0  . vitamin B-12 (CYANOCOBALAMIN) 1000 MCG tablet Take 1,000 mcg by mouth daily.    Marland Kitchen gabapentin (NEURONTIN) 300 MG capsule TAKE 2 CAPSULES BY MOUTH 3 TIMES DAILY. 180 capsule 0  . lisinopril-hydrochlorothiazide (PRINZIDE,ZESTORETIC) 10-12.5 MG tablet Take 1 tablet by mouth daily. 30 tablet 5  . naproxen (NAPROSYN) 500 MG tablet TAKE 1 TABLET BY MOUTH 2 TIMES DAILY WITH FOOD AS NEEDED FOR MODERATE PAIN 60 tablet 0  . cyclobenzaprine (FLEXERIL) 10 MG tablet Take 1 tablet (10 mg total) by mouth at bedtime. For 7 nights (Patient not taking: Reported on 04/03/2017) 7 tablet 0   No facility-administered medications prior to visit.     Allergies  Allergen Reactions  . Aspirin   . Augmentin [Amoxicillin-Pot Clavulanate]     Review of Systems  Constitutional: Negative for fever, malaise/fatigue and weight loss.  HENT: Negative.  Negative for nosebleeds.   Eyes: Negative.  Negative for blurred vision, double vision and photophobia.  Respiratory: Negative.  Negative for cough and shortness of breath.   Cardiovascular: Negative.  Negative for chest pain, palpitations and leg swelling.  Gastrointestinal: Negative.  Negative for abdominal pain, constipation, diarrhea, heartburn, nausea and vomiting.  Musculoskeletal: Positive for back pain and myalgias.  Neurological: Positive for tingling. Negative for dizziness, focal weakness, seizures and headaches.    Endo/Heme/Allergies: Negative for environmental allergies.  Psychiatric/Behavioral: Negative.  Negative for suicidal ideas.       Objective:    Physical Exam  Constitutional: She is oriented to person, place, and time. She appears well-developed and well-nourished. She is cooperative.  HENT:  Head: Normocephalic and atraumatic.  Eyes: EOM are normal.  Neck: Normal range of motion.  Cardiovascular: Normal rate, regular rhythm, normal heart sounds and intact distal pulses. Exam reveals no gallop and no friction rub.  No murmur heard. Pulmonary/Chest: Effort normal and breath sounds normal. No tachypnea. No respiratory distress. She has no decreased breath sounds. She has no wheezes. She has no rhonchi. She has no rales. She exhibits no tenderness.  Abdominal: Soft. Bowel sounds are normal.  Musculoskeletal: Normal range of motion. She exhibits no edema.  Neurological: She is alert and oriented to person, place, and time. Coordination normal.  Skin:  Skin is warm and dry.  Psychiatric: She has a normal mood and affect. Her behavior is normal. Judgment and thought content normal.  Nursing note and vitals reviewed.   BP (!) 148/77 (BP Location: Left Arm, Patient Position: Sitting, Cuff Size: Normal)   Pulse 71   Temp 98.1 F (36.7 C) (Oral)   Resp 18   Ht 5\' 3"  (1.6 m)   Wt 214 lb 12.8 oz (97.4 kg)   LMP  (Exact Date)   SpO2 100%   BMI 38.05 kg/m  Wt Readings from Last 3 Encounters:  04/03/17 214 lb 12.8 oz (97.4 kg)  12/11/16 220 lb (99.8 kg)  12/04/16 220 lb 3.2 oz (99.9 kg)   Patient has been counseled on age-appropriate routine health concerns for screening and prevention. These are reviewed and up-to-date. Referrals have been placed accordingly. Immunizations are up-to-date or declined.        Patient has been counseled extensively about nutrition and exercise as well as the importance of adherence with medications and regular follow-up. The patient was given clear  instructions to go to ER or return to medical center if symptoms don't improve, worsen or new problems develop. The patient verbalized understanding.   Follow-up: Return in about 3 months (around 07/04/2017) for HTN/HPL/DM.   Gildardo Pounds, FNP-BC [facility] [place], [place]   04/03/2017, 5:35 PM

## 2017-04-03 NOTE — Progress Notes (Signed)
[place]   

## 2017-04-06 MED FILL — NAPROXEN 500 MG TABLET: 500 | 30 days supply | Qty: 60 | Fill #0

## 2017-04-06 MED FILL — GABAPENTIN 300 MG CAPSULE: 300 | 30 days supply | Qty: 180 | Fill #0

## 2017-04-06 MED FILL — LISINOPRIL-HCTZ 10-12.5 MG: 10-12.5 | 30 days supply | Qty: 30 | Fill #0

## 2017-05-04 MED FILL — LISINOPRIL-HCTZ 10-12.5 MG: 10-12.5 | 30 days supply | Qty: 30 | Fill #1

## 2017-05-04 MED FILL — GABAPENTIN 300 MG CAPSULE: 300 | 30 days supply | Qty: 180 | Fill #1

## 2017-06-15 ENCOUNTER — Other Ambulatory Visit: Payer: Self-pay

## 2017-06-15 MED FILL — LISINOPRIL-HCTZ 10-12.5 MG: 10-12.5 | 30 days supply | Qty: 30 | Fill #2

## 2017-06-25 ENCOUNTER — Telehealth: Payer: Self-pay | Admitting: Nurse Practitioner

## 2017-06-25 ENCOUNTER — Other Ambulatory Visit: Payer: Self-pay

## 2017-06-25 DIAGNOSIS — M5441 Lumbago with sciatica, right side: Principal | ICD-10-CM

## 2017-06-25 DIAGNOSIS — M5442 Lumbago with sciatica, left side: Principal | ICD-10-CM

## 2017-06-25 DIAGNOSIS — G5701 Lesion of sciatic nerve, right lower limb: Secondary | ICD-10-CM

## 2017-06-25 DIAGNOSIS — G8929 Other chronic pain: Secondary | ICD-10-CM

## 2017-06-25 MED ORDER — GABAPENTIN 300 MG PO CAPS: ORAL_CAPSULE | ORAL | 1 refills | Status: DC

## 2017-06-25 MED FILL — GABAPENTIN 300 MG CAPSULE: 300 | 30 days supply | Qty: 180 | Fill #0

## 2017-06-25 NOTE — Telephone Encounter (Signed)
Pt came in to request a refill on -gabapentin (NEURONTIN) 300 MG capsule  If approved please send to [place] Please follow up

## 2017-06-25 NOTE — Telephone Encounter (Signed)
Patient came in to request have her Gabapentin refills.  Rx sent to [facility] pharmacy.

## 2017-06-26 NOTE — Telephone Encounter (Signed)
Order was refilled on 06-25-2017

## 2017-07-07 ENCOUNTER — Ambulatory Visit: Payer: Self-pay | Attending: Nurse Practitioner | Admitting: Nurse Practitioner

## 2017-07-07 ENCOUNTER — Encounter: Payer: Self-pay | Admitting: Nurse Practitioner

## 2017-07-07 VITALS — BP 141/79 | HR 65 | Temp 98.8°F | Ht 63.0 in | Wt 214.4 lb

## 2017-07-07 DIAGNOSIS — E559 Vitamin D deficiency, unspecified: Secondary | ICD-10-CM

## 2017-07-07 DIAGNOSIS — Z886 Allergy status to analgesic agent status: Secondary | ICD-10-CM | POA: Insufficient documentation

## 2017-07-07 DIAGNOSIS — Z79899 Other long term (current) drug therapy: Secondary | ICD-10-CM | POA: Insufficient documentation

## 2017-07-07 DIAGNOSIS — M5136 Other intervertebral disc degeneration, lumbar region: Secondary | ICD-10-CM | POA: Insufficient documentation

## 2017-07-07 DIAGNOSIS — I10 Essential (primary) hypertension: Secondary | ICD-10-CM

## 2017-07-07 DIAGNOSIS — Z880 Allergy status to penicillin: Secondary | ICD-10-CM | POA: Insufficient documentation

## 2017-07-07 DIAGNOSIS — R7303 Prediabetes: Secondary | ICD-10-CM

## 2017-07-07 LAB — GLUCOSE, POCT (MANUAL RESULT ENTRY): POC Glucose: 75 mg/dl (ref 70–99)

## 2017-07-07 MED ORDER — LISINOPRIL-HYDROCHLOROTHIAZIDE 10-12.5 MG PO TABS: 1.0000 | ORAL_TABLET | Freq: Every day | ORAL | 1 refills | Status: DC

## 2017-07-07 NOTE — Patient Instructions (Addendum)
DASH Eating Plan DASH stands for "Dietary Approaches to Stop Hypertension." The DASH eating plan is a healthy eating plan that has been shown to reduce high blood pressure (hypertension). It may also reduce your risk for type 2 diabetes, heart disease, and stroke. The DASH eating plan may also help with weight loss. What are tips for following this plan? General guidelines  Avoid eating more than 2,300 mg (milligrams) of salt (sodium) a day. If you have hypertension, you may need to reduce your sodium intake to 1,500 mg a day.  Limit alcohol intake to no more than 1 drink a day for nonpregnant women and 2 drinks a day for men. One drink equals 12 oz of beer, 5 oz of wine, or 1 oz of hard liquor.  Work with your health care provider to maintain a healthy body weight or to lose weight. Ask what an ideal weight is for you.  Get at least 30 minutes of exercise that causes your heart to beat faster (aerobic exercise) most days of the week. Activities may include walking, swimming, or biking.  Work with your health care provider or diet and nutrition specialist (dietitian) to adjust your eating plan to your individual calorie needs. Reading food labels  Check food labels for the amount of sodium per serving. Choose foods with less than 5 percent of the Daily Value of sodium. Generally, foods with less than 300 mg of sodium per serving fit into this eating plan.  To find whole grains, look for the word "whole" as the first word in the ingredient list. Shopping  Buy products labeled as "low-sodium" or "no salt added."  Buy fresh foods. Avoid canned foods and premade or frozen meals. Cooking  Avoid adding salt when cooking. Use salt-free seasonings or herbs instead of table salt or sea salt. Check with your health care provider or pharmacist before using salt substitutes.  Do not fry foods. Cook foods using healthy methods such as baking, boiling, grilling, and broiling instead.  Cook with  heart-healthy oils, such as olive, canola, soybean, or sunflower oil. Meal planning   Eat a balanced diet that includes: ? 5 or more servings of fruits and vegetables each day. At each meal, try to fill half of your plate with fruits and vegetables. ? Up to 6-8 servings of whole grains each day. ? Less than 6 oz of lean meat, poultry, or fish each day. A 3-oz serving of meat is about the same size as a deck of cards. One egg equals 1 oz. ? 2 servings of low-fat dairy each day. ? A serving of nuts, seeds, or beans 5 times each week. ? Heart-healthy fats. Healthy fats called Omega-3 fatty acids are found in foods such as flaxseeds and coldwater fish, like sardines, salmon, and mackerel.  Limit how much you eat of the following: ? Canned or prepackaged foods. ? Food that is high in trans fat, such as fried foods. ? Food that is high in saturated fat, such as fatty meat. ? Sweets, desserts, sugary drinks, and other foods with added sugar. ? Full-fat dairy products.  Do not salt foods before eating.  Try to eat at least 2 vegetarian meals each week.  Eat more home-cooked food and less restaurant, buffet, and fast food.  When eating at a restaurant, ask that your food be prepared with less salt or no salt, if possible. What foods are recommended? The items listed may not be a complete list. Talk with your dietitian about what   dietary choices are best for you. Grains Whole-grain or whole-wheat bread. Whole-grain or whole-wheat pasta. Brown rice. Oatmeal. Quinoa. Bulgur. Whole-grain and low-sodium cereals. Pita bread. Low-fat, low-sodium crackers. Whole-wheat flour tortillas. Vegetables Fresh or frozen vegetables (raw, steamed, roasted, or grilled). Low-sodium or reduced-sodium tomato and vegetable juice. Low-sodium or reduced-sodium tomato sauce and tomato paste. Low-sodium or reduced-sodium canned vegetables. Fruits All fresh, dried, or frozen fruit. Canned fruit in natural juice (without  added sugar). Meat and other protein foods Skinless chicken or turkey. Ground chicken or turkey. Pork with fat trimmed off. Fish and seafood. Egg whites. Dried beans, peas, or lentils. Unsalted nuts, nut butters, and seeds. Unsalted canned beans. Lean cuts of beef with fat trimmed off. Low-sodium, lean deli meat. Dairy Low-fat (1%) or fat-free (skim) milk. Fat-free, low-fat, or reduced-fat cheeses. Nonfat, low-sodium ricotta or cottage cheese. Low-fat or nonfat yogurt. Low-fat, low-sodium cheese. Fats and oils Soft margarine without trans fats. Vegetable oil. Low-fat, reduced-fat, or light mayonnaise and salad dressings (reduced-sodium). Canola, safflower, olive, soybean, and sunflower oils. Avocado. Seasoning and other foods Herbs. Spices. Seasoning mixes without salt. Unsalted popcorn and pretzels. Fat-free sweets. What foods are not recommended? The items listed may not be a complete list. Talk with your dietitian about what dietary choices are best for you. Grains Baked goods made with fat, such as croissants, muffins, or some breads. Dry pasta or rice meal packs. Vegetables Creamed or fried vegetables. Vegetables in a cheese sauce. Regular canned vegetables (not low-sodium or reduced-sodium). Regular canned tomato sauce and paste (not low-sodium or reduced-sodium). Regular tomato and vegetable juice (not low-sodium or reduced-sodium). Pickles. Olives. Fruits Canned fruit in a light or heavy syrup. Fried fruit. Fruit in cream or butter sauce. Meat and other protein foods Fatty cuts of meat. Ribs. Fried meat. Bacon. Sausage. Bologna and other processed lunch meats. Salami. Fatback. Hotdogs. Bratwurst. Salted nuts and seeds. Canned beans with added salt. Canned or smoked fish. Whole eggs or egg yolks. Chicken or turkey with skin. Dairy Whole or 2% milk, cream, and half-and-half. Whole or full-fat cream cheese. Whole-fat or sweetened yogurt. Full-fat cheese. Nondairy creamers. Whipped toppings.  Processed cheese and cheese spreads. Fats and oils Butter. Stick margarine. Lard. Shortening. Ghee. Bacon fat. Tropical oils, such as coconut, palm kernel, or palm oil. Seasoning and other foods Salted popcorn and pretzels. Onion salt, garlic salt, seasoned salt, table salt, and sea salt. Worcestershire sauce. Tartar sauce. Barbecue sauce. Teriyaki sauce. Soy sauce, including reduced-sodium. Steak sauce. Canned and packaged gravies. Fish sauce. Oyster sauce. Cocktail sauce. Horseradish that you find on the shelf. Ketchup. Mustard. Meat flavorings and tenderizers. Bouillon cubes. Hot sauce and Tabasco sauce. Premade or packaged marinades. Premade or packaged taco seasonings. Relishes. Regular salad dressings. Where to find more information:  National Heart, Lung, and Blood Institute: www.nhlbi.nih.gov  American Heart Association: www.heart.org Summary  The DASH eating plan is a healthy eating plan that has been shown to reduce high blood pressure (hypertension). It may also reduce your risk for type 2 diabetes, heart disease, and stroke.  With the DASH eating plan, you should limit salt (sodium) intake to 2,300 mg a day. If you have hypertension, you may need to reduce your sodium intake to 1,500 mg a day.  When on the DASH eating plan, aim to eat more fresh fruits and vegetables, whole grains, lean proteins, low-fat dairy, and heart-healthy fats.  Work with your health care provider or diet and nutrition specialist (dietitian) to adjust your eating plan to your individual   calorie needs. This information is not intended to replace advice given to you by your health care provider. Make sure you discuss any questions you have with your health care provider. Document Released: 04/24/2011 Document Revised: 04/28/2016 Document Reviewed: 04/28/2016 Elsevier Interactive Patient Education  2018 [employer]  Hypertension Hypertension, commonly called high blood pressure, is when the force of blood  pumping through the arteries is too strong. The arteries are the blood vessels that carry blood from the heart throughout the body. Hypertension forces the heart to work harder to pump blood and may cause arteries to become narrow or stiff. Having untreated or uncontrolled hypertension can cause heart attacks, strokes, kidney disease, and other problems. A blood pressure reading consists of a higher number over a lower number. Ideally, your blood pressure should be below 120/80. The first ("top") number is called the systolic pressure. It is a measure of the pressure in your arteries as your heart beats. The second ("bottom") number is called the diastolic pressure. It is a measure of the pressure in your arteries as the heart relaxes. What are the causes? The cause of this condition is not known. What increases the risk? Some risk factors for high blood pressure are under your control. Others are not. Factors you can change  Smoking.  Having type 2 diabetes mellitus, high cholesterol, or both.  Not getting enough exercise or physical activity.  Being overweight.  Having too much fat, sugar, calories, or salt (sodium) in your diet.  Drinking too much alcohol. Factors that are difficult or impossible to change  Having chronic kidney disease.  Having a family history of high blood pressure.  Age. Risk increases with age.  Race. You may be at higher risk if you are African-American.  Gender. Men are at higher risk than women before age 45. After age 65, women are at higher risk than men.  Having obstructive sleep apnea.  Stress. What are the signs or symptoms? Extremely high blood pressure (hypertensive crisis) may cause:  Headache.  Anxiety.  Shortness of breath.  Nosebleed.  Nausea and vomiting.  Severe chest pain.  Jerky movements you cannot control (seizures).  How is this diagnosed? This condition is diagnosed by measuring your blood pressure while you are  seated, with your arm resting on a surface. The cuff of the blood pressure monitor will be placed directly against the skin of your upper arm at the level of your heart. It should be measured at least twice using the same arm. Certain conditions can cause a difference in blood pressure between your right and left arms. Certain factors can cause blood pressure readings to be lower or higher than normal (elevated) for a short period of time:  When your blood pressure is higher when you are in a health care provider's office than when you are at home, this is called white coat hypertension. Most people with this condition do not need medicines.  When your blood pressure is higher at home than when you are in a health care provider's office, this is called masked hypertension. Most people with this condition may need medicines to control blood pressure.  If you have a high blood pressure reading during one visit or you have normal blood pressure with other risk factors:  You may be asked to return on a different day to have your blood pressure checked again.  You may be asked to monitor your blood pressure at home for 1 week or longer.  If you are   diagnosed with hypertension, you may have other blood or imaging tests to help your health care provider understand your overall risk for other conditions. How is this treated? This condition is treated by making healthy lifestyle changes, such as eating healthy foods, exercising more, and reducing your alcohol intake. Your health care provider may prescribe medicine if lifestyle changes are not enough to get your blood pressure under control, and if:  Your systolic blood pressure is above 130.  Your diastolic blood pressure is above 80.  Your personal target blood pressure may vary depending on your medical conditions, your age, and other factors. Follow these instructions at home: Eating and drinking  Eat a diet that is high in fiber and potassium,  and low in sodium, added sugar, and fat. An example eating plan is called the DASH (Dietary Approaches to Stop Hypertension) diet. To eat this way: ? Eat plenty of fresh fruits and vegetables. Try to fill half of your plate at each meal with fruits and vegetables. ? Eat whole grains, such as whole wheat pasta, brown rice, or whole grain bread. Fill about one quarter of your plate with whole grains. ? Eat or drink low-fat dairy products, such as skim milk or low-fat yogurt. ? Avoid fatty cuts of meat, processed or cured meats, and poultry with skin. Fill about one quarter of your plate with lean proteins, such as fish, chicken without skin, beans, eggs, and tofu. ? Avoid premade and processed foods. These tend to be higher in sodium, added sugar, and fat.  Reduce your daily sodium intake. Most people with hypertension should eat less than 1,500 mg of sodium a day.  Limit alcohol intake to no more than 1 drink a day for nonpregnant women and 2 drinks a day for men. One drink equals 12 oz of beer, 5 oz of wine, or 1 oz of hard liquor. Lifestyle  Work with your health care provider to maintain a healthy body weight or to lose weight. Ask what an ideal weight is for you.  Get at least 30 minutes of exercise that causes your heart to beat faster (aerobic exercise) most days of the week. Activities may include walking, swimming, or biking.  Include exercise to strengthen your muscles (resistance exercise), such as pilates or lifting weights, as part of your weekly exercise routine. Try to do these types of exercises for 30 minutes at least 3 days a week.  Do not use any products that contain nicotine or tobacco, such as cigarettes and e-cigarettes. If you need help quitting, ask your health care provider.  Monitor your blood pressure at home as told by your health care provider.  Keep all follow-up visits as told by your health care provider. This is important. Medicines  Take over-the-counter and  prescription medicines only as told by your health care provider. Follow directions carefully. Blood pressure medicines must be taken as prescribed.  Do not skip doses of blood pressure medicine. Doing this puts you at risk for problems and can make the medicine less effective.  Ask your health care provider about side effects or reactions to medicines that you should watch for. Contact a health care provider if:  You think you are having a reaction to a medicine you are taking.  You have headaches that keep coming back (recurring).  You feel dizzy.  You have swelling in your ankles.  You have trouble with your vision. Get help right away if:  You develop a severe headache or confusion.    You have unusual weakness or numbness.  You feel faint.  You have severe pain in your chest or abdomen.  You vomit repeatedly.  You have trouble breathing. Summary  Hypertension is when the force of blood pumping through your arteries is too strong. If this condition is not controlled, it may put you at risk for serious complications.  Your personal target blood pressure may vary depending on your medical conditions, your age, and other factors. For most people, a normal blood pressure is less than 120/80.  Hypertension is treated with lifestyle changes, medicines, or a combination of both. Lifestyle changes include weight loss, eating a healthy, low-sodium diet, exercising more, and limiting alcohol. This information is not intended to replace advice given to you by your health care provider. Make sure you discuss any questions you have with your health care provider. Document Released: 05/05/2005 Document Revised: 04/02/2016 Document Reviewed: 04/02/2016 Elsevier Interactive Patient Education  2018 [employer].  Vitamin D Deficiency Vitamin D deficiency is when your body does not have enough vitamin D. Vitamin D is important to your body for many reasons:  It helps the body to absorb  two important minerals, called calcium and phosphorus.  It plays a role in bone health.  It may help to prevent some diseases, such as diabetes and multiple sclerosis.  It plays a role in muscle function, including heart function.  You can get vitamin D by:  Eating foods that naturally contain vitamin D.  Eating or drinking milk or other dairy products that have vitamin D added to them.  Taking a vitamin D supplement or a multivitamin supplement that contains vitamin D.  Being in the sun. Your body naturally makes vitamin D when your skin is exposed to sunlight. Your body changes the sunlight into a form of the vitamin that the body can use.  If vitamin D deficiency is severe, it can cause a condition in which your bones become soft. In adults, this condition is called osteomalacia. In children, this condition is called rickets. What are the causes? Vitamin D deficiency may be caused by:  Not eating enough foods that contain vitamin D.  Not getting enough sun exposure.  Having certain digestive system diseases that make it difficult for your body to absorb vitamin D. These diseases include Crohn disease, chronic pancreatitis, and cystic fibrosis.  Having a surgery in which a part of the stomach or a part of the small intestine is removed.  Being obese.  Having chronic kidney disease or liver disease.  What increases the risk? This condition is more likely to develop in:  Older people.  People who do not spend much time outdoors.  People who live in a long-term care facility.  People who have had broken bones.  People with weak or thin bones (osteoporosis).  People who have a disease or condition that changes how the body absorbs vitamin D.  People who have dark skin.  People who take certain medicines, such as steroid medicines or certain seizure medicines.  People who are overweight or obese.  What are the signs or symptoms? In mild cases of vitamin D  deficiency, there may not be any symptoms. If the condition is severe, symptoms may include:  Bone pain.  Muscle pain.  Falling often.  Broken bones caused by a minor injury.  How is this diagnosed? This condition is usually diagnosed with a blood test. How is this treated? Treatment for this condition may depend on what caused the condition.  Treatment options include:  Taking vitamin D supplements.  Taking a calcium supplement. Your health care provider will suggest what dose is best for you.  Follow these instructions at home:  Take medicines and supplements only as told by your health care provider.  Eat foods that contain vitamin D. Choices include: ? Fortified dairy products, cereals, or juices. Fortified means that vitamin D has been added to the food. Check the label on the package to be sure. ? Fatty fish, such as salmon or trout. ? Eggs. ? Oysters.  Do not use a tanning bed.  Maintain a healthy weight. Lose weight, if needed.  Keep all follow-up visits as told by your health care provider. This is important. Contact a health care provider if:  Your symptoms do not go away.  You feel like throwing up (nausea) or you throw up (vomit).  You have fewer bowel movements than usual or it is difficult for you to have a bowel movement (constipation). This information is not intended to replace advice given to you by your health care provider. Make sure you discuss any questions you have with your health care provider. Document Released: 07/28/2011 Document Revised: 10/17/2015 Document Reviewed: 09/20/2014 Elsevier Interactive Patient Education  2018 [employer].

## 2017-07-07 NOTE — Progress Notes (Signed)
Assessment & Plan:  Rhonda [name] was seen today for follow-up.  Diagnoses and all orders for this visit:  Prediabetes -     Glucose (CBG) -     Lipid panel Work on a low fat, heart healthy diet and participate in regular aerobic exercise program to control as well by working out at least 150 minutes per week. No fried foods. No junk foods, sodas, sugary drinks, unhealthy snacking, or smoking.   Vitamin D deficiency -     VITAMIN D 25 Hydroxy (Vit-D Deficiency, Fractures)  Essential hypertension -     CMP14+EGFR -     lisinopril-hydrochlorothiazide (PRINZIDE,ZESTORETIC) 10-12.5 MG tablet; Take 1 tablet by mouth daily. Continue all antihypertensives as prescribed.  Remember to bring in your blood pressure log with you for your follow up appointment.  DASH/Mediterranean Diets are healthier choices for HTN.    Patient has been counseled on age-appropriate routine health concerns for screening and prevention. These are reviewed and up-to-date. Referrals have been placed accordingly. Immunizations are up-to-date or declined.    Subjective:   Chief Complaint  Patient presents with  . Follow-up    Patient is here for a follow-up.    HPI Rhonda [name] 57 y.o. female presents to office today for follow up.   Essential Hypertension She endorses medication compliance. Checking blood pressures at home: Average 120/70s. Her blood pressure is slightly elevated today. She states she is stressed about her granddaughter who has recently joined a gang and has been getting in trouble at school. She has to go to court today and has been very upset. She currently denies chest pain, shortness of breath, palpitations, lightheadedness, dizziness, headaches or BLE edema.  BP Readings from Last 3 Encounters:  07/07/17 (!) 141/79  04/03/17 (!) 148/77  12/04/16 (!) 196/108   Prediabetes Her husband has diabetes and HTN as well as kidney failure. She has made significant dietary changes at home. She does  state she has not been exercising lately as she has been worried about her granddaughter. She denies any hypo or hyperglycemic symptoms. Lab Results  Component Value Date   HGBA1C 6.2 04/03/2017   Vitamin D deficiency She is currently taking OTC vitamin D. Her last vitamin D level was 29. Will recheck today.    Review of Systems  Constitutional: Negative for fever, malaise/fatigue and weight loss.  HENT: Negative.  Negative for nosebleeds.   Eyes: Negative.  Negative for blurred vision, double vision and photophobia.  Respiratory: Negative.  Negative for cough and shortness of breath.   Cardiovascular: Negative.  Negative for chest pain, palpitations and leg swelling.  Gastrointestinal: Negative.  Negative for abdominal pain, constipation, diarrhea, heartburn, nausea and vomiting.  Musculoskeletal: Negative.  Negative for myalgias.  Neurological: Negative.  Negative for dizziness, focal weakness, seizures and headaches.  Endo/Heme/Allergies: Negative for environmental allergies.  Psychiatric/Behavioral: Negative.  Negative for hallucinations, substance abuse and suicidal ideas. The patient is not nervous/anxious and does not have insomnia.     Past Medical History:  Diagnosis Date  . Arthritis   . Degenerative disc disease, lumbar   . Hypertension   . Prediabetes     Past Surgical History:  Procedure Laterality Date  . ABDOMINAL HYSTERECTOMY    . BREAST EXCISIONAL BIOPSY Right   . BREAST SURGERY     right breast  . CESAREAN SECTION      Family History  Problem Relation Age of Onset  . Cancer Mother   . Hypertension Sister   .  Diabetes Sister   . Diabetes Maternal Grandmother     Social History Reviewed with no changes to be made today.   Outpatient Medications Prior to Visit  Medication Sig Dispense Refill  . cholecalciferol (VITAMIN D) 1000 UNITS tablet Take 2,000 Units by mouth daily.    Marland Kitchen gabapentin (NEURONTIN) 300 MG capsule TAKE 2 CAPSULES BY MOUTH 3 TIMES  DAILY. 180 capsule 1  . glucosamine-chondroitin 500-400 MG tablet Take 1 tablet by mouth 3 (three) times daily. 90 tablet 2  . naproxen (NAPROSYN) 500 MG tablet TAKE 1 TABLET BY MOUTH 2 TIMES DAILY WITH FOOD AS NEEDED FOR MODERATE PAIN 60 tablet 1  . Omega-3 Fatty Acids (FISH OIL PO) Take by mouth.    . cyclobenzaprine (FLEXERIL) 10 MG tablet Take 1 tablet (10 mg total) by mouth at bedtime. For 7 nights (Patient not taking: Reported on 04/03/2017) 7 tablet 0  . vitamin B-12 (CYANOCOBALAMIN) 1000 MCG tablet Take 1,000 mcg by mouth daily.    Marland Kitchen lisinopril-hydrochlorothiazide (PRINZIDE,ZESTORETIC) 10-12.5 MG tablet Take 1 tablet daily by mouth. 90 tablet 1  . traMADol (ULTRAM) 50 MG tablet Take 1 tablet (50 mg total) by mouth every 6 (six) hours as needed. (Patient not taking: Reported on 07/07/2017) 30 tablet 0   No facility-administered medications prior to visit.     Allergies  Allergen Reactions  . Aspirin   . Augmentin [Amoxicillin-Pot Clavulanate]        Objective:    BP (!) 141/79 (BP Location: Left Arm, Patient Position: Sitting, Cuff Size: Normal)   Pulse 65   Temp 98.8 F (37.1 C) (Oral)   Ht '5\' 3"'$  (1.6 m)   Wt 214 lb 6.4 oz (97.3 kg)   SpO2 100%   BMI 37.98 kg/m  Wt Readings from Last 3 Encounters:  07/07/17 214 lb 6.4 oz (97.3 kg)  04/03/17 214 lb 12.8 oz (97.4 kg)  12/11/16 220 lb (99.8 kg)    Physical Exam  Constitutional: She is oriented to person, place, and time. She appears well-developed and well-nourished. She is cooperative.  HENT:  Head: Normocephalic and atraumatic.  Eyes: EOM are normal.  Neck: Normal range of motion.  Cardiovascular: Normal rate, regular rhythm, normal heart sounds and intact distal pulses. Exam reveals no gallop and no friction rub.  No murmur heard. Pulmonary/Chest: Effort normal and breath sounds normal. No tachypnea. No respiratory distress. She has no decreased breath sounds. She has no wheezes. She has no rhonchi. She has no  rales. She exhibits no tenderness.  Abdominal: Soft. Bowel sounds are normal.  Musculoskeletal: Normal range of motion. She exhibits no edema.  Neurological: She is alert and oriented to person, place, and time. Coordination normal.  Skin: Skin is warm and dry.  Psychiatric: She has a normal mood and affect. Her behavior is normal. Judgment and thought content normal.  Nursing note and vitals reviewed.        Patient has been counseled extensively about nutrition and exercise as well as the importance of adherence with medications and regular follow-up. The patient was given clear instructions to go to ER or return to medical center if symptoms don't improve, worsen or new problems develop. The patient verbalized understanding.   Follow-up: Return in about 3 months (around 10/04/2017) for HTN/HPL/prediabetes.   Gildardo Pounds, FNP-BC [facility] [place], [place]   07/07/2017, 9:59 AM

## 2017-07-08 LAB — CMP14+EGFR
A/G RATIO: 1.2 (ref 1.2–2.2)
ALT: 42 IU/L — ABNORMAL HIGH (ref 0–32)
AST: 71 IU/L — AB (ref 0–40)
Albumin: 3.9 g/dL (ref 3.5–5.5)
Alkaline Phosphatase: 74 IU/L (ref 39–117)
BUN / CREAT RATIO: 18 (ref 9–23)
BUN: 15 mg/dL (ref 6–24)
Bilirubin Total: 0.2 mg/dL (ref 0.0–1.2)
CALCIUM: 9.6 mg/dL (ref 8.7–10.2)
CHLORIDE: 102 mmol/L (ref 96–106)
CO2: 25 mmol/L (ref 20–29)
CREATININE: 0.85 mg/dL (ref 0.57–1.00)
GFR calc Af Amer: 89 mL/min/{1.73_m2} (ref >59)
GFR calc non Af Amer: 77 mL/min/{1.73_m2} (ref >59)
GLOBULIN, TOTAL: 3.2 g/dL (ref 1.5–4.5)
Glucose: 91 mg/dL (ref 65–99)
Potassium: 4.5 mmol/L (ref 3.5–5.2)
Sodium: 142 mmol/L (ref 134–144)
TOTAL PROTEIN: 7.1 g/dL (ref 6.0–8.5)

## 2017-07-08 LAB — LIPID PANEL
Chol/HDL Ratio: 5.3 ratio — ABNORMAL HIGH (ref 0.0–4.4)
Cholesterol, Total: 213 mg/dL — ABNORMAL HIGH (ref 100–199)
HDL: 40 mg/dL (ref >39)
LDL Calculated: 137 mg/dL — ABNORMAL HIGH (ref 0–99)
TRIGLYCERIDES: 182 mg/dL — AB (ref 0–149)
VLDL Cholesterol Cal: 36 mg/dL (ref 5–40)

## 2017-07-08 LAB — VITAMIN D 25 HYDROXY (VIT D DEFICIENCY, FRACTURES): Vit D, 25-Hydroxy: 32.1 ng/mL (ref 30.0–100.0)

## 2017-07-09 ENCOUNTER — Other Ambulatory Visit: Payer: Self-pay | Admitting: Obstetrics and Gynecology

## 2017-07-09 ENCOUNTER — Ambulatory Visit
Admission: RE | Admit: 2017-07-09 | Discharge: 2017-07-09 | Disposition: A | Discharge status: Home / Self Care | Payer: Self-pay | Source: Ambulatory Visit | Attending: Obstetrics and Gynecology | Admitting: Obstetrics and Gynecology

## 2017-07-09 DIAGNOSIS — N6320 Unspecified lump in the left breast, unspecified quadrant: Secondary | ICD-10-CM

## 2017-07-27 MED FILL — LISINOPRIL-HCTZ 10-12.5 MG: 10-12.5 | 30 days supply | Qty: 30 | Fill #3

## 2017-07-27 MED FILL — GABAPENTIN 300 MG CAPSULE: 300 | 30 days supply | Qty: 180 | Fill #1

## 2017-09-01 ENCOUNTER — Other Ambulatory Visit: Payer: Self-pay | Admitting: Nurse Practitioner

## 2017-09-01 DIAGNOSIS — G5701 Lesion of sciatic nerve, right lower limb: Secondary | ICD-10-CM

## 2017-09-01 MED FILL — LISINOPRIL-HCTZ 10-12.5 MG: 10-12.5 | 30 days supply | Qty: 30 | Fill #4

## 2017-09-01 MED FILL — GABAPENTIN 300 MG CAPSULE: 300 | 30 days supply | Qty: 180 | Fill #0

## 2017-09-02 ENCOUNTER — Ambulatory Visit: Payer: Self-pay

## 2017-09-30 ENCOUNTER — Ambulatory Visit: Payer: Medicaid Other | Admitting: Nurse Practitioner

## 2017-10-05 ENCOUNTER — Ambulatory Visit (INDEPENDENT_AMBULATORY_CARE_PROVIDER_SITE_OTHER): Payer: Self-pay | Admitting: Nurse Practitioner

## 2017-10-05 ENCOUNTER — Other Ambulatory Visit: Payer: Self-pay

## 2017-10-05 ENCOUNTER — Encounter (INDEPENDENT_AMBULATORY_CARE_PROVIDER_SITE_OTHER): Payer: Self-pay | Admitting: Nurse Practitioner

## 2017-10-05 VITALS — BP 127/78 | HR 73 | Temp 98.1°F | Ht 63.0 in | Wt 213.6 lb

## 2017-10-05 DIAGNOSIS — G8929 Other chronic pain: Secondary | ICD-10-CM

## 2017-10-05 DIAGNOSIS — M5441 Lumbago with sciatica, right side: Secondary | ICD-10-CM

## 2017-10-05 DIAGNOSIS — R7303 Prediabetes: Secondary | ICD-10-CM

## 2017-10-05 DIAGNOSIS — M5442 Lumbago with sciatica, left side: Secondary | ICD-10-CM

## 2017-10-05 DIAGNOSIS — I10 Essential (primary) hypertension: Secondary | ICD-10-CM

## 2017-10-05 LAB — POCT GLYCOSYLATED HEMOGLOBIN (HGB A1C): HEMOGLOBIN A1C: 6.1 % — AB (ref 4.0–5.6)

## 2017-10-05 MED ORDER — GABAPENTIN 300 MG PO CAPS: 600.0000 mg | ORAL_CAPSULE | Freq: Three times a day (TID) | ORAL | 3 refills | Status: DC

## 2017-10-05 MED ORDER — LISINOPRIL-HYDROCHLOROTHIAZIDE 10-12.5 MG PO TABS
1.0000 | ORAL_TABLET | Freq: Every day | ORAL | 1 refills | Status: DC
Start: 2017-10-05 — End: 2019-05-24

## 2017-10-05 MED FILL — GABAPENTIN 300 MG CAPSULE: 300 | 30 days supply | Qty: 180 | Fill #1

## 2017-10-05 MED FILL — LISINOPRIL-HCTZ 10-12.5 MG: 10-12.5 | 30 days supply | Qty: 30 | Fill #5

## 2017-10-05 NOTE — Patient Instructions (Signed)
Prediabetes Eating Plan Prediabetes-also called impaired glucose tolerance or impaired fasting glucose-is a condition that causes blood sugar (blood glucose) levels to be higher than normal. Following a healthy diet can help to keep prediabetes under control. It can also help to lower the risk of type 2 diabetes and heart disease, which are increased in people who have prediabetes. Along with regular exercise, a healthy diet:  Promotes weight loss.  Helps to control blood sugar levels.  Helps to improve the way that the body uses insulin.  What do I need to know about this eating plan?  Use the glycemic index (GI) to plan your meals. The index tells you how quickly a food will raise your blood sugar. Choose low-GI foods. These foods take a longer time to raise blood sugar.  Pay close attention to the amount of carbohydrates in the food that you eat. Carbohydrates increase blood sugar levels.  Keep track of how many calories you take in. Eating the right amount of calories will help you to achieve a healthy weight. Losing about 7 percent of your starting weight can help to prevent type 2 diabetes.  You may want to follow a Mediterranean diet. This diet includes a lot of vegetables, lean meats or fish, whole grains, fruits, and healthy oils and fats. What foods can I eat? Grains Whole grains, such as whole-wheat or whole-grain breads, crackers, cereals, and pasta. Unsweetened oatmeal. Bulgur. Barley. Quinoa. Brown rice. Corn or whole-wheat flour tortillas or taco shells. Vegetables Lettuce. Spinach. Peas. Beets. Cauliflower. Cabbage. Broccoli. Carrots. Tomatoes. Squash. Eggplant. Herbs. Peppers. Onions. Cucumbers. Brussels sprouts. Fruits Berries. Bananas. Apples. Oranges. Grapes. Papaya. Mango. Pomegranate. Kiwi. Grapefruit. Cherries. Meats and Other Protein Sources Seafood. Lean meats, such as chicken and [place] or lean cuts of pork and beef. Tofu. Eggs. Nuts. Beans. Dairy Low-fat or  fat-free dairy products, such as yogurt, cottage cheese, and cheese. Beverages Water. Tea. Coffee. Sugar-free or diet soda. Seltzer water. Milk. Milk alternatives, such as soy or almond milk. Condiments Mustard. Relish. Low-fat, low-sugar ketchup. Low-fat, low-sugar barbecue sauce. Low-fat or fat-free mayonnaise. Sweets and Desserts Sugar-free or low-fat pudding. Sugar-free or low-fat ice cream and other frozen treats. Fats and Oils Avocado. Walnuts. Olive oil. The items listed above may not be a complete list of recommended foods or beverages. Contact your dietitian for more options. What foods are not recommended? Grains Refined white flour and flour products, such as bread, pasta, snack foods, and cereals. Beverages Sweetened drinks, such as sweet iced tea and soda. Sweets and Desserts Baked goods, such as cake, cupcakes, pastries, cookies, and cheesecake. The items listed above may not be a complete list of foods and beverages to avoid. Contact your dietitian for more information. This information is not intended to replace advice given to you by your health care provider. Make sure you discuss any questions you have with your health care provider. Document Released: 09/19/2014 Document Revised: 10/11/2015 Document Reviewed: 05/31/2014 Elsevier Interactive Patient Education  2017 Elsevier Inc.  Preventing Type 2 Diabetes Mellitus Type 2 diabetes (type 2 diabetes mellitus) is a long-term (chronic) disease that affects blood sugar (glucose) levels. Normally, a hormone called insulin allows glucose to enter cells in the body. The cells use glucose for energy. In type 2 diabetes, one or both of these problems may be present:  The body does not make enough insulin.  The body does not respond properly to insulin that it makes (insulin resistance).  Insulin resistance or lack of insulin causes excess glucose  to build up in the blood instead of going into cells. As a result, high blood  glucose (hyperglycemia) develops, which can cause many complications. Being overweight or obese and having an inactive (sedentary) lifestyle can increase your risk for diabetes. Type 2 diabetes can be delayed or prevented by making certain nutrition and lifestyle changes. What nutrition changes can be made?  Eat healthy meals and snacks regularly. Keep a healthy snack with you for when you get hungry between meals, such as fruit or a handful of nuts.  Eat lean meats and proteins that are low in saturated fats, such as chicken, fish, egg whites, and beans. Avoid processed meats.  Eat plenty of fruits and vegetables and plenty of grains that have not been processed (whole grains). It is recommended that you eat: ? 1?2 cups of fruit every day. ? 2?3 cups of vegetables every day. ? 6?8 oz of whole grains every day, such as oats, whole wheat, bulgur, brown rice, quinoa, and millet.  Eat low-fat dairy products, such as milk, yogurt, and cheese.  Eat foods that contain healthy fats, such as nuts, avocado, olive oil, and canola oil.  Drink water throughout the day. Avoid drinks that contain added sugar, such as soda or sweet tea.  Follow instructions from your health care provider about specific eating or drinking restrictions.  Control how much food you eat at a time (portion size). ? Check food labels to find out the serving sizes of foods. ? Use a kitchen scale to weigh amounts of foods.  Saute or steam food instead of frying it. Cook with water or broth instead of oils or butter.  Limit your intake of: ? Salt (sodium). Have no more than 1 tsp (2,400 mg) of sodium a day. If you have heart disease or high blood pressure, have less than ? tsp (1,500 mg) of sodium a day. ? Saturated fat. This is fat that is solid at room temperature, such as butter or fat on meat. What lifestyle changes can be made?  Activity  Do moderate-intensity physical activity for at least 30 minutes on at least 5  days of the week, or as much as told by your health care provider.  Ask your health care provider what activities are safe for you. A mix of physical activities may be best, such as walking, swimming, cycling, and strength training.  Try to add physical activity into your day. For example: ? Park in spots that are farther away than usual, so that you walk more. For example, park in a far corner of the parking lot when you go to the office or the grocery store. ? Take a walk during your lunch break. ? Use stairs instead of elevators or escalators. Weight Loss  Lose weight as directed. Your health care provider can determine how much weight loss is best for you and can help you lose weight safely.  If you are overweight or obese, you may be instructed to lose at least 5?7 % of your body weight. Alcohol and Tobacco   Limit alcohol intake to no more than 1 drink a day for nonpregnant women and 2 drinks a day for men. One drink equals 12 oz of beer, 5 oz of wine, or 1 oz of hard liquor.  Do not use any tobacco products, such as cigarettes, chewing tobacco, and e-cigarettes. If you need help quitting, ask your health care provider. Work With [place] Provider  Have your blood glucose tested regularly, as told  by your health care provider.  Discuss your risk factors and how you can reduce your risk for diabetes.  Get screening tests as told by your health care provider. You may have screening tests regularly, especially if you have certain risk factors for type 2 diabetes.  Make an appointment with a diet and nutrition specialist (registered dietitian). A registered dietitian can help you make a healthy eating plan and can help you understand portion sizes and food labels. Why are these changes important?  It is possible to prevent or delay type 2 diabetes and related health problems by making lifestyle and nutrition changes.  It can be difficult to recognize signs of type 2  diabetes. The best way to avoid possible damage to your body is to take actions to prevent the disease before you develop symptoms. What can happen if changes are not made?  Your blood glucose levels may keep increasing. Having high blood glucose for a long time is dangerous. Too much glucose in your blood can damage your blood vessels, heart, kidneys, nerves, and eyes.  You may develop prediabetes or type 2 diabetes. Type 2 diabetes can lead to many chronic health problems and complications, such as: ? Heart disease. ? Stroke. ? Blindness. ? Kidney disease. ? Depression. ? Poor circulation in the feet and legs, which could lead to surgical removal (amputation) in severe cases. Where to find support:  Ask your health care provider to recommend a registered dietitian, diabetes educator, or weight loss program.  Look for local or online weight loss groups.  Join a gym, fitness club, or outdoor activity group, such as a walking club. Where to find more information: To learn more about diabetes and diabetes prevention, visit:  American Diabetes Association (ADA): www.diabetes.[employer] of Diabetes and Digestive and Kidney Diseases: FindSpin.nl  To learn more about healthy eating, visit:  The U.S. Department of Agriculture Scientist, research (physical sciences)), Choose My Plate: http://wiley-williams.com/  Office of Disease Prevention and Health Promotion (ODPHP), Dietary Guidelines: SurferLive.at  Summary  You can reduce your risk for type 2 diabetes by increasing your physical activity, eating healthy foods, and losing weight as directed.  Talk with your health care provider about your risk for type 2 diabetes. Ask about any blood tests or screening tests that you need to have. This information is not intended to replace advice given to you by your health care provider. Make sure you discuss any questions you have with your health care  provider. Document Released: 08/27/2015 Document Revised: 10/11/2015 Document Reviewed: 06/26/2015 Elsevier Interactive Patient Education  [employer].

## 2017-10-05 NOTE — Progress Notes (Signed)
Assessment & Plan:  Flornce was seen today for follow-up.  Diagnoses and all orders for this visit:  Prediabetes -     Cancel: HgB A1c -     HgB A1c Continue blood sugar control as discussed in office today, low carbohydrate diet, and regular physical exercise as tolerated, 150 minutes per week (30 min each day, 5 days per week, or 50 min 3 days per week). Annual eye exams and foot exams are recommended.  Chronic low back pain with bilateral sciatica, unspecified back pain laterality -     gabapentin (NEURONTIN) 300 MG capsule; Take 2 capsules (600 mg total) by mouth 3 (three) times daily. Work on losing weight to help reduce back pain. May alternate with heat and ice application for pain relief. May also alternate with acetaminophen and Ibuprofen as prescribed for back pain. Other alternatives include massage, acupuncture and water aerobics.  You must stay active and avoid a sedentary lifestyle.   Essential hypertension -     lisinopril-hydrochlorothiazide (PRINZIDE,ZESTORETIC) 10-12.5 MG tablet; Take 1 tablet by mouth daily. Continue all antihypertensives as prescribed.  Remember to bring in your blood pressure log with you for your follow up appointment.  DASH/Mediterranean Diets are healthier choices for HTN.   Hyperlipidemia INSTRUCTIONS: Work on a low fat, heart healthy diet and participate in regular aerobic exercise program by working out at least 150 minutes per week. No fried foods. No junk foods, sodas, sugary drinks, unhealthy snacking, alcohol or smoking.      Patient has been counseled on age-appropriate routine health concerns for screening and prevention. These are reviewed and up-to-date. Referrals have been placed accordingly. Immunizations are up-to-date or declined.    Subjective:   Chief Complaint  Patient presents with  . Follow-up    HTN/HPL/prediabetes    HPI Rhonda [name] 57 y.o. female presents to office today for follow up Prediabetes, HTN, HPL. She  is doing well today with no issues. Endorses medication compliance.     Essential Hypertension Chronic. Stable and well controlled. She endorses medication compliance taking prinzide 10-12.5mg  tablets daily. Denies chest pain, shortness of breath, palpitations, lightheadedness, dizziness, headaches or BLE edema.  BP Readings from Last 3 Encounters:  10/05/17 127/78  07/07/17 (!) 141/79  04/03/17 (!) 148/77    Prediabetes Disease course shows no change. There are no hypoglycemic symptoms. There are no hypoglycemic complications. Symptoms are stable. There are not diabetic complications. Risk factors for coronary artery disease include family history, dyslipidemia, obesity, hypertension, sedentary lifestyle. Current diabetic treatment includes diet and exercise.    Weight is  stable. Meal planning includes avoidance of concentrated sweets. Patient has not seen a dietician. Patient is not compliant with exercise.   She does not have a glucometer and does not check her blood sugars at home.  An ACE inhibitor/angiotensin II receptor blocker is being taken. She does not see a podiatrist. Eye exam is not current.  Lab Results  Component Value Date   HGBA1C 6.1 (A) 10/05/2017   Lab Results  Component Value Date   HGBA1C 6.2 04/03/2017   Hyperlipidemia Patient presents for follow up to hyperlipidemia.  She is medication compliant however she is not taking a statin. She takes omega 3 fish oil capsules dailys. She is not diet compliant and denies chest pain, dyspnea, exertional chest pressure/discomfort, poor exercise tolerance and skin xanthelasma or statin intolerance including myalgias. LDL is not at goal. She is not fasting today. Will obtain lipid panel at next office visit.  Lab Results  Component Value Date   CHOL 213 (H) 07/07/2017   Lab Results  Component Value Date   HDL 40 07/07/2017   Lab Results  Component Value Date   LDLCALC 137 (H) 07/07/2017   Lab Results  Component Value  Date   TRIG 182 (H) 07/07/2017   Lab Results  Component Value Date   CHOLHDL 5.3 (H) 07/07/2017   Low Back Pain Include lower back pain with right sided numbness and tingling, achiness and dullness. Gabapentin provides significant relief of her pain. She denies any involuntary loss of bowel or bladder.    Review of Systems  Constitutional: Negative for fever, malaise/fatigue and weight loss.  HENT: Negative.  Negative for nosebleeds.   Eyes: Negative.  Negative for blurred vision, double vision and photophobia.  Respiratory: Negative.  Negative for cough and shortness of breath.   Cardiovascular: Negative.  Negative for chest pain, palpitations and leg swelling.  Gastrointestinal: Negative.  Negative for heartburn, nausea and vomiting.  Musculoskeletal: Positive for back pain and myalgias.  Neurological: Negative.  Negative for dizziness, focal weakness, seizures and headaches.  Psychiatric/Behavioral: Negative.  Negative for suicidal ideas.    Past Medical History:  Diagnosis Date  . Arthritis   . Degenerative disc disease, lumbar   . Hypertension   . Prediabetes     Past Surgical History:  Procedure Laterality Date  . ABDOMINAL HYSTERECTOMY    . BREAST EXCISIONAL BIOPSY Right   . BREAST SURGERY     right breast  . CESAREAN SECTION      Family History  Problem Relation Age of Onset  . Cancer Mother   . Hypertension Sister   . Diabetes Sister   . Diabetes Maternal Grandmother     Social History Reviewed with no changes to be made today.   Outpatient Medications Prior to Visit  Medication Sig Dispense Refill  . cholecalciferol (VITAMIN D) 1000 UNITS tablet Take 2,000 Units by mouth daily.    Marland Kitchen glucosamine-chondroitin 500-400 MG tablet Take 1 tablet by mouth 3 (three) times daily. 90 tablet 2  . naproxen (NAPROSYN) 500 MG tablet TAKE 1 TABLET BY MOUTH 2 TIMES DAILY WITH FOOD AS NEEDED FOR MODERATE PAIN 60 tablet 1  . Omega-3 Fatty Acids (FISH OIL PO) Take by  mouth.    . vitamin B-12 (CYANOCOBALAMIN) 1000 MCG tablet Take 1,000 mcg by mouth daily.    Marland Kitchen gabapentin (NEURONTIN) 300 MG capsule TAKE 2 CAPSULES BY MOUTH 3 TIMES DAILY. 180 capsule 1  . lisinopril-hydrochlorothiazide (PRINZIDE,ZESTORETIC) 10-12.5 MG tablet Take 1 tablet by mouth daily. 90 tablet 1  . cyclobenzaprine (FLEXERIL) 10 MG tablet Take 1 tablet (10 mg total) by mouth at bedtime. For 7 nights (Patient not taking: Reported on 04/03/2017) 7 tablet 0   No facility-administered medications prior to visit.     Allergies  Allergen Reactions  . Aspirin   . Augmentin [Amoxicillin-Pot Clavulanate]        Objective:    BP 127/78 (BP Location: Right Arm, Patient Position: Sitting, Cuff Size: Large)   Pulse 73   Temp 98.1 F (36.7 C) (Oral)   Ht 5\' 3"  (1.6 m)   Wt 213 lb 9.6 oz (96.9 kg)   SpO2 97%   BMI 37.84 kg/m  Wt Readings from Last 3 Encounters:  10/05/17 213 lb 9.6 oz (96.9 kg)  07/07/17 214 lb 6.4 oz (97.3 kg)  04/03/17 214 lb 12.8 oz (97.4 kg)    Physical Exam  Constitutional: She is  oriented to person, place, and time. She appears well-developed and well-nourished. She is cooperative.  HENT:  Head: Normocephalic and atraumatic.  Eyes: EOM are normal.  Neck: Normal range of motion.  Cardiovascular: Normal rate, regular rhythm and normal heart sounds. Exam reveals no gallop and no friction rub.  No murmur heard. Pulmonary/Chest: Effort normal and breath sounds normal. No tachypnea. No respiratory distress. She has no decreased breath sounds. She has no wheezes. She has no rhonchi. She has no rales. She exhibits no tenderness.  Abdominal: Bowel sounds are normal.  Musculoskeletal: Normal range of motion. She exhibits no edema.  Neurological: She is alert and oriented to person, place, and time. Coordination normal.  Skin: Skin is warm and dry.  Psychiatric: She has a normal mood and affect. Her behavior is normal. Judgment and thought content normal.  Nursing note  and vitals reviewed.        Patient has been counseled extensively about nutrition and exercise as well as the importance of adherence with medications and regular follow-up. The patient was given clear instructions to go to ER or return to medical center if symptoms don't improve, worsen or new problems develop. The patient verbalized understanding.   Follow-up: Return in about 6 months (around 04/07/2018) for HTN/HPL/Prediabetes.   Gildardo Pounds, FNP-BC [facility] and [facility], [place]   10/05/2017, 3:08 PM

## 2017-11-09 MED FILL — LISINOPRIL-HCTZ 10-12.5 MG: 10-12.5 | 30 days supply | Qty: 30 | Fill #0

## 2017-11-09 MED FILL — NAPROXEN 500 MG TABLET: 500 | 30 days supply | Qty: 60 | Fill #1

## 2017-11-09 MED FILL — GABAPENTIN 300 MG CAPSULE: 300 | 30 days supply | Qty: 180 | Fill #0

## 2017-12-14 MED FILL — GABAPENTIN 300 MG CAPSULE: 300 | 30 days supply | Qty: 180 | Fill #1

## 2017-12-14 MED FILL — LISINOPRIL-HCTZ 10-12.5 MG: 10-12.5 | 30 days supply | Qty: 30 | Fill #1

## 2017-12-17 ENCOUNTER — Telehealth (HOSPITAL_COMMUNITY): Payer: Self-pay | Admitting: *Deleted

## 2017-12-17 ENCOUNTER — Ambulatory Visit: Payer: No Typology Code available for payment source

## 2017-12-17 NOTE — Telephone Encounter (Signed)
Telephoned patient at home number and left message to return call to [facility] 

## 2017-12-18 ENCOUNTER — Telehealth (HOSPITAL_COMMUNITY): Payer: Self-pay

## 2017-12-18 NOTE — Telephone Encounter (Signed)
Talk with patient and told her that her mammo on 8/27 needed to be cancel and she would receive a call to be scheduled with [facility]

## 2017-12-30 ENCOUNTER — Ambulatory Visit: Payer: No Typology Code available for payment source | Admitting: Nurse Practitioner

## 2018-01-12 ENCOUNTER — Other Ambulatory Visit: Payer: No Typology Code available for payment source

## 2018-01-19 MED FILL — GABAPENTIN 300 MG CAPSULE: 300 | 30 days supply | Qty: 180 | Fill #2

## 2018-01-19 MED FILL — LISINOPRIL-HCTZ 10-12.5 MG: 10-12.5 | 30 days supply | Qty: 30 | Fill #2

## 2018-02-10 ENCOUNTER — Ambulatory Visit: Payer: Self-pay | Attending: Nurse Practitioner | Admitting: Nurse Practitioner

## 2018-02-10 ENCOUNTER — Encounter: Payer: Self-pay | Admitting: Nurse Practitioner

## 2018-02-10 VITALS — BP 124/83 | HR 79 | Temp 99.1°F | Ht 63.0 in | Wt 215.0 lb

## 2018-02-10 DIAGNOSIS — R109 Unspecified abdominal pain: Secondary | ICD-10-CM | POA: Insufficient documentation

## 2018-02-10 DIAGNOSIS — R7303 Prediabetes: Secondary | ICD-10-CM | POA: Insufficient documentation

## 2018-02-10 DIAGNOSIS — M5136 Other intervertebral disc degeneration, lumbar region: Secondary | ICD-10-CM | POA: Insufficient documentation

## 2018-02-10 DIAGNOSIS — Z880 Allergy status to penicillin: Secondary | ICD-10-CM | POA: Insufficient documentation

## 2018-02-10 DIAGNOSIS — Z886 Allergy status to analgesic agent status: Secondary | ICD-10-CM | POA: Insufficient documentation

## 2018-02-10 DIAGNOSIS — Z79899 Other long term (current) drug therapy: Secondary | ICD-10-CM | POA: Insufficient documentation

## 2018-02-10 DIAGNOSIS — I10 Essential (primary) hypertension: Secondary | ICD-10-CM | POA: Insufficient documentation

## 2018-02-10 DIAGNOSIS — Z23 Encounter for immunization: Secondary | ICD-10-CM

## 2018-02-10 LAB — GLUCOSE, POCT (MANUAL RESULT ENTRY): POC GLUCOSE: 104 mg/dL — AB (ref 70–99)

## 2018-02-10 NOTE — Patient Instructions (Signed)

## 2018-02-10 NOTE — Progress Notes (Signed)
Assessment & Plan:  Rhonda [name] was seen today for flank pain.  Diagnoses and all orders for this visit:  Right sided abdominal pain -     CMP14+EGFR -     Urinalysis, Complete -     Lipase  Prediabetes -     Glucose (CBG) Chronic and stable. She denies any hypo or hyperglycemic symptoms. Currently diet controlled. She does not check her blood glucose levels at home Lab Results  Component Value Date   HGBA1C 6.1 (A) 10/05/2017  Continue blood sugar control as discussed in office today, low carbohydrate diet, and regular physical exercise as tolerated, 150 minutes per week (30 min each day, 5 days per week, or 50 min 3 days per week). Keep blood sugar logs with fasting goal of 90-130 mg/dl, post prandial (after you eat) less than 180.  For Hypoglycemia: BS <60 and Hyperglycemia BS >400; contact the clinic ASAP. Annual eye exams and foot exams are recommended.  Patient has been counseled on age-appropriate routine health concerns for screening and prevention. These are reviewed and up-to-date. Referrals have been placed accordingly. Immunizations are up-to-date or declined.    Subjective:   Chief Complaint  Patient presents with  . Flank Pain    Pt. stated her right side pain started a week ago. Pt. stated it hurts more when she bend over with a sharp shooting pain. The pain is not a constant pain.    HPI Rhonda [name] 57 y.o. female presents to office today with right sided abdominal pain. New onset 1 week ago with recent worsening. Pain does not long enough for her to take any medications to help relieve the pain. Pain lasts for several minutes then subsides on its own.  Aggravating factors; bending over, activity, rest. Pain occurs at any time. Denies nausea, vomiting, fever, diarrhea or constipation. Bowel movements are normal.  She does not drink alcohol. She does not engage in sexual activity with her spouse as he has erectile dysfunction and can not perform sexually.    Review of  Systems  Constitutional: Negative for fever, malaise/fatigue and weight loss.  HENT: Negative.  Negative for nosebleeds.   Eyes: Negative.  Negative for blurred vision, double vision and photophobia.  Respiratory: Negative.  Negative for cough and shortness of breath.   Cardiovascular: Negative.  Negative for chest pain, palpitations and leg swelling.  Gastrointestinal: Positive for abdominal pain. Negative for heartburn, nausea and vomiting.  Musculoskeletal: Negative.  Negative for myalgias.  Neurological: Negative.  Negative for dizziness, focal weakness, seizures and headaches.  Psychiatric/Behavioral: Negative.  Negative for suicidal ideas.    Past Medical History:  Diagnosis Date  . Arthritis   . Degenerative disc disease, lumbar   . Hypertension   . Prediabetes     Past Surgical History:  Procedure Laterality Date  . ABDOMINAL HYSTERECTOMY    . BREAST EXCISIONAL BIOPSY Right   . BREAST SURGERY     right breast  . CESAREAN SECTION      Family History  Problem Relation Age of Onset  . Cancer Mother   . Hypertension Sister   . Diabetes Sister   . Diabetes Maternal Grandmother     Social History Reviewed with no changes to be made today.   Outpatient Medications Prior to Visit  Medication Sig Dispense Refill  . cholecalciferol (VITAMIN D) 1000 UNITS tablet Take 2,000 Units by mouth daily.    Marland Kitchen gabapentin (NEURONTIN) 300 MG capsule Take 2 capsules (600 mg total) by mouth  3 (three) times daily. 180 capsule 3  . glucosamine-chondroitin 500-400 MG tablet Take 1 tablet by mouth 3 (three) times daily. 90 tablet 2  . naproxen (NAPROSYN) 500 MG tablet TAKE 1 TABLET BY MOUTH 2 TIMES DAILY WITH FOOD AS NEEDED FOR MODERATE PAIN 60 tablet 1  . Omega-3 Fatty Acids (FISH OIL PO) Take by mouth.    . cyclobenzaprine (FLEXERIL) 10 MG tablet Take 1 tablet (10 mg total) by mouth at bedtime. For 7 nights (Patient not taking: Reported on 04/03/2017) 7 tablet 0  .  lisinopril-hydrochlorothiazide (PRINZIDE,ZESTORETIC) 10-12.5 MG tablet Take 1 tablet by mouth daily. 90 tablet 1  . vitamin B-12 (CYANOCOBALAMIN) 1000 MCG tablet Take 1,000 mcg by mouth daily.     No facility-administered medications prior to visit.     Allergies  Allergen Reactions  . Aspirin   . Augmentin [Amoxicillin-Pot Clavulanate]        Objective:    BP 124/83 (BP Location: Left Arm, Patient Position: Sitting, Cuff Size: Normal)   Pulse 79   Temp 99.1 F (37.3 C) (Oral)   Ht '5\' 3"'$  (1.6 m)   Wt 215 lb (97.5 kg)   SpO2 97%   BMI 38.09 kg/m  Wt Readings from Last 3 Encounters:  02/10/18 215 lb (97.5 kg)  10/05/17 213 lb 9.6 oz (96.9 kg)  07/07/17 214 lb 6.4 oz (97.3 kg)    Physical Exam  Constitutional: She is oriented to person, place, and time. She appears well-developed and well-nourished. She is cooperative.  HENT:  Head: Normocephalic and atraumatic.  Eyes: EOM are normal.  Neck: Normal range of motion.  Cardiovascular: Normal rate, regular rhythm, normal heart sounds and intact distal pulses. Exam reveals no gallop and no friction rub.  No murmur heard. Pulmonary/Chest: Effort normal and breath sounds normal. No tachypnea. No respiratory distress. She has no decreased breath sounds. She has no wheezes. She has no rhonchi. She has no rales. She exhibits no tenderness.  Abdominal: Soft. Bowel sounds are normal. She exhibits no distension and no mass. There is no tenderness. There is no rebound and no guarding. No hernia.  Musculoskeletal: Normal range of motion. She exhibits no edema.  Neurological: She is alert and oriented to person, place, and time. Coordination normal.  Skin: Skin is warm and dry.  Psychiatric: She has a normal mood and affect. Her behavior is normal. Judgment and thought content normal.  Nursing note and vitals reviewed.      Patient has been counseled extensively about nutrition and exercise as well as the importance of adherence with  medications and regular follow-up. The patient was given clear instructions to go to ER or return to medical center if symptoms don't improve, worsen or new problems develop. The patient verbalized understanding.   Follow-up: Return in about 3 months (around 05/12/2018) for htn.   Gildardo Pounds, FNP-BC [facility] and [place], [place]   02/10/2018, 4:34 PM

## 2018-02-11 LAB — CMP14+EGFR
A/G RATIO: 1.4 (ref 1.2–2.2)
ALK PHOS: 81 IU/L (ref 39–117)
ALT: 83 IU/L — AB (ref 0–32)
AST: 120 IU/L — AB (ref 0–40)
Albumin: 4 g/dL (ref 3.5–5.5)
BUN/Creatinine Ratio: 19 (ref 9–23)
BUN: 18 mg/dL (ref 6–24)
CHLORIDE: 102 mmol/L (ref 96–106)
CO2: 25 mmol/L (ref 20–29)
Calcium: 9.5 mg/dL (ref 8.7–10.2)
Creatinine, Ser: 0.95 mg/dL (ref 0.57–1.00)
GFR, EST AFRICAN AMERICAN: 77 mL/min/{1.73_m2} (ref >59)
GFR, EST NON AFRICAN AMERICAN: 67 mL/min/{1.73_m2} (ref >59)
Globulin, Total: 2.9 g/dL (ref 1.5–4.5)
Glucose: 96 mg/dL (ref 65–99)
POTASSIUM: 4 mmol/L (ref 3.5–5.2)
Sodium: 142 mmol/L (ref 134–144)
Total Protein: 6.9 g/dL (ref 6.0–8.5)

## 2018-02-11 LAB — URINALYSIS, COMPLETE
BILIRUBIN UA: NEGATIVE
Glucose, UA: NEGATIVE
KETONES UA: NEGATIVE
LEUKOCYTES UA: NEGATIVE
Nitrite, UA: NEGATIVE
PH UA: 6 (ref 5.0–7.5)
PROTEIN UA: NEGATIVE
RBC UA: NEGATIVE
Specific Gravity, UA: 1.015 (ref 1.005–1.030)
UUROB: 0.2 mg/dL (ref 0.2–1.0)

## 2018-02-11 LAB — MICROSCOPIC EXAMINATION
BACTERIA UA: NONE SEEN
Casts: NONE SEEN /lpf

## 2018-02-11 LAB — LIPASE: LIPASE: 79 U/L — AB (ref 14–72)

## 2018-02-14 ENCOUNTER — Other Ambulatory Visit: Payer: Self-pay | Admitting: Nurse Practitioner

## 2018-02-14 DIAGNOSIS — R1011 Right upper quadrant pain: Secondary | ICD-10-CM

## 2018-02-17 ENCOUNTER — Telehealth: Payer: Self-pay | Admitting: Nurse Practitioner

## 2018-02-17 ENCOUNTER — Telehealth: Payer: Self-pay

## 2018-02-17 NOTE — Telephone Encounter (Signed)
CMA attempt to reach patient to schedule for an abdominal ultrasound.  No answer and left a VM for patient.  If patient call back, please ask what day and time she would like the ultrasound to be schedule.  A letter will be send out to reach patient.

## 2018-02-17 NOTE — Progress Notes (Signed)
Attempt to call patient. No answer and left a Vm.  A letter will be send out to reach patient.

## 2018-02-17 NOTE — Telephone Encounter (Signed)
Patient called to request her Ultrasound appointment be scheduled for anyday after 2:30pm except 02/24/2018. Please follow up with appt time and date

## 2018-02-17 NOTE — Telephone Encounter (Signed)
CMA spoke to patient.  Patient is aware of the scheduled date for her abdomen US and CMA gave patient the radiology number to call if the date do not work.

## 2018-02-19 MED FILL — LISINOPRIL-HCTZ 10-12.5 MG: 10-12.5 | 30 days supply | Qty: 30 | Fill #3

## 2018-02-19 MED FILL — GABAPENTIN 300 MG CAPSULE: 300 | 30 days supply | Qty: 180 | Fill #3

## 2018-02-24 ENCOUNTER — Ambulatory Visit (HOSPITAL_COMMUNITY)
Admission: RE | Admit: 2018-02-24 | Discharge: 2018-02-24 | Disposition: A | Discharge status: Home / Self Care | Payer: Medicaid Other | Source: Ambulatory Visit | Attending: Nurse Practitioner | Admitting: Nurse Practitioner

## 2018-02-24 DIAGNOSIS — R932 Abnormal findings on diagnostic imaging of liver and biliary tract: Secondary | ICD-10-CM | POA: Insufficient documentation

## 2018-02-24 DIAGNOSIS — R1011 Right upper quadrant pain: Secondary | ICD-10-CM | POA: Insufficient documentation

## 2018-02-25 ENCOUNTER — Ambulatory Visit (HOSPITAL_COMMUNITY): Payer: Medicaid Other

## 2018-03-22 ENCOUNTER — Other Ambulatory Visit (INDEPENDENT_AMBULATORY_CARE_PROVIDER_SITE_OTHER): Payer: Self-pay | Admitting: Nurse Practitioner

## 2018-03-22 DIAGNOSIS — G8929 Other chronic pain: Secondary | ICD-10-CM

## 2018-03-22 DIAGNOSIS — M5442 Lumbago with sciatica, left side: Principal | ICD-10-CM

## 2018-03-22 DIAGNOSIS — M5441 Lumbago with sciatica, right side: Principal | ICD-10-CM

## 2018-03-22 MED FILL — LISINOPRIL-HCTZ 10-12.5 MG: 10-12.5 | 30 days supply | Qty: 30 | Fill #4

## 2018-03-25 MED FILL — GABAPENTIN 300 MG CAPSULE: 300 | 30 days supply | Qty: 180 | Fill #0

## 2018-04-27 MED FILL — GABAPENTIN 300 MG CAPSULE: 300 | 30 days supply | Qty: 180 | Fill #1

## 2018-04-27 MED FILL — LISINOPRIL-HCTZ 10-12.5 MG: 10-12.5 | 30 days supply | Qty: 30 | Fill #5

## 2018-05-03 ENCOUNTER — Ambulatory Visit: Payer: Medicaid Other | Admitting: Nurse Practitioner

## 2018-05-08 ENCOUNTER — Encounter (HOSPITAL_COMMUNITY): Payer: Self-pay

## 2018-05-08 ENCOUNTER — Ambulatory Visit (HOSPITAL_COMMUNITY)
Admission: EM | Admit: 2018-05-08 | Discharge: 2018-05-08 | Disposition: A | Discharge status: Home / Self Care | Payer: Medicaid Other | Attending: Family Medicine | Admitting: Family Medicine

## 2018-05-08 ENCOUNTER — Other Ambulatory Visit: Payer: Self-pay

## 2018-05-08 DIAGNOSIS — J0110 Acute frontal sinusitis, unspecified: Secondary | ICD-10-CM

## 2018-05-08 MED ORDER — AZITHROMYCIN 250 MG PO TABS: 250.0000 mg | ORAL_TABLET | Freq: Every day | ORAL | 0 refills | Status: DC

## 2018-05-08 NOTE — ED Triage Notes (Signed)
Pt cc congestion x 1 month now or more and body aches as well.

## 2018-05-08 NOTE — ED Provider Notes (Signed)
[place]   [id number] 05/08/18 Arrival Time: 7616  ASSESSMENT & PLAN:  1. Acute non-recurrent frontal sinusitis    Meds ordered this encounter  Medications  . azithromycin (ZITHROMAX) 250 MG tablet    Sig: Take 1 tablet (250 mg total) by mouth daily. Take first 2 tablets together, then 1 every day until finished.    Dispense:  6 tablet    Refill:  0   Discussed typical duration of symptoms. OTC symptom care as needed. Ensure adequate fluid intake and rest.  Follow-up Information    Gildardo Pounds, NP.   Specialty:  Nurse Practitioner Why:  As needed. Contact information: [place] [id number] [phone]          Reviewed expectations re: course of current medical issues. Questions answered. Outlined signs and symptoms indicating need for more acute intervention. Patient verbalized understanding. After Visit Summary given.   SUBJECTIVE: History from: patient.  Rhonda [name] is a 57 y.o. female who presents with complaint of nasal congestion, post-nasal drainage, and sinus pain. Onset gradual, reports nasal/facial congestion over the past month; sinus pressuer over the past week. Respiratory symptoms: initially with cold symptoms; none now. Fever: no. Overall normal PO intake without n/v. OTC treatment: various OTC cold medications without much help. History of frequent sinus infections: no. No specific aggravating or alleviating factors reported.  Social History   Tobacco Use  Smoking Status Never Smoker  Smokeless Tobacco Never Used    ROS: As per HPI.   OBJECTIVE:  Vitals:   05/08/18 1548 05/08/18 1549  BP:  118/74  Pulse:  93  Resp:  18  Temp:  98.4 F (36.9 C)  TempSrc:  Oral  SpO2:  100%  Weight: 97.1 kg     General appearance: alert HEENT: nasal congestion; clear runny nose; throat irritation secondary to post-nasal drainage; bilateral frontal tenderness to palpation; mild maxillary tenderness; turbinates  are boggy Neck: supple without LAD; trachea midline Lungs: unlabored respirations, symmetrical air entry; cough: mild; no respiratory distress Skin: warm and dry Psychological: alert and cooperative; normal mood and affect  Allergies  Allergen Reactions  . Aspirin   . Augmentin [Amoxicillin-Pot Clavulanate]     Past Medical History:  Diagnosis Date  . Arthritis   . Degenerative disc disease, lumbar   . Hypertension   . Prediabetes    Family History  Problem Relation Age of Onset  . Cancer Mother   . Hypertension Sister   . Diabetes Sister   . Diabetes Maternal Grandmother    Social History   Socioeconomic History  . Marital status: Married    Spouse name: Not on file  . Number of children: Not on file  . Years of education: Not on file  . Highest education level: Not on file  Occupational History  . Not on file  Social Needs  . Financial resource strain: Not on file  . Food insecurity:    Worry: Not on file    Inability: Not on file  . Transportation needs:    Medical: Not on file    Non-medical: Not on file  Tobacco Use  . Smoking status: Never Smoker  . Smokeless tobacco: Never Used  Substance and Sexual Activity  . Alcohol use: No  . Drug use: No  . Sexual activity: Yes    Birth control/protection: None  Lifestyle  . Physical activity:    Days per week: Not on file    Minutes per session: Not  on file  . Stress: Not on file  Relationships  . Social connections:    Talks on phone: Not on file    Gets together: Not on file    Attends religious service: Not on file    Active member of club or organization: Not on file    Attends meetings of clubs or organizations: Not on file    Relationship status: Not on file  . Intimate partner violence:    Fear of current or ex partner: Not on file    Emotionally abused: Not on file    Physically abused: Not on file    Forced sexual activity: Not on file  Other Topics Concern  . Not on file  Social History  Narrative  . Not on file            Vanessa Kick, MD 05/10/18 [phone]

## 2018-08-26 DIAGNOSIS — R590 Localized enlarged lymph nodes: Secondary | ICD-10-CM | POA: Insufficient documentation

## 2019-05-22 ENCOUNTER — Other Ambulatory Visit: Payer: Self-pay

## 2019-05-22 ENCOUNTER — Encounter (HOSPITAL_COMMUNITY): Payer: Self-pay | Admitting: *Deleted

## 2019-05-22 ENCOUNTER — Inpatient Hospital Stay (HOSPITAL_COMMUNITY)
Admission: EM | Admit: 2019-05-22 | Discharge: 2019-05-24 | DRG: 065 | Disposition: A | Discharge status: Home / Self Care | Payer: BC Managed Care – PPO | Attending: Internal Medicine | Admitting: Internal Medicine

## 2019-05-22 ENCOUNTER — Emergency Department (HOSPITAL_COMMUNITY): Payer: BC Managed Care – PPO

## 2019-05-22 DIAGNOSIS — R4781 Slurred speech: Secondary | ICD-10-CM | POA: Diagnosis present

## 2019-05-22 DIAGNOSIS — I6381 Other cerebral infarction due to occlusion or stenosis of small artery: Secondary | ICD-10-CM | POA: Diagnosis not present

## 2019-05-22 DIAGNOSIS — H538 Other visual disturbances: Secondary | ICD-10-CM | POA: Diagnosis present

## 2019-05-22 DIAGNOSIS — R112 Nausea with vomiting, unspecified: Secondary | ICD-10-CM | POA: Diagnosis present

## 2019-05-22 DIAGNOSIS — Z886 Allergy status to analgesic agent status: Secondary | ICD-10-CM

## 2019-05-22 DIAGNOSIS — K760 Fatty (change of) liver, not elsewhere classified: Secondary | ICD-10-CM | POA: Diagnosis present

## 2019-05-22 DIAGNOSIS — M5136 Other intervertebral disc degeneration, lumbar region: Secondary | ICD-10-CM | POA: Diagnosis present

## 2019-05-22 DIAGNOSIS — Z8249 Family history of ischemic heart disease and other diseases of the circulatory system: Secondary | ICD-10-CM

## 2019-05-22 DIAGNOSIS — Z880 Allergy status to penicillin: Secondary | ICD-10-CM

## 2019-05-22 DIAGNOSIS — G4733 Obstructive sleep apnea (adult) (pediatric): Secondary | ICD-10-CM | POA: Diagnosis present

## 2019-05-22 DIAGNOSIS — I10 Essential (primary) hypertension: Secondary | ICD-10-CM | POA: Diagnosis present

## 2019-05-22 DIAGNOSIS — R7303 Prediabetes: Secondary | ICD-10-CM | POA: Diagnosis present

## 2019-05-22 DIAGNOSIS — Z833 Family history of diabetes mellitus: Secondary | ICD-10-CM

## 2019-05-22 DIAGNOSIS — M1990 Unspecified osteoarthritis, unspecified site: Secondary | ICD-10-CM | POA: Diagnosis present

## 2019-05-22 DIAGNOSIS — E871 Hypo-osmolality and hyponatremia: Secondary | ICD-10-CM | POA: Diagnosis present

## 2019-05-22 DIAGNOSIS — I639 Cerebral infarction, unspecified: Secondary | ICD-10-CM | POA: Diagnosis not present

## 2019-05-22 DIAGNOSIS — Z20822 Contact with and (suspected) exposure to covid-19: Secondary | ICD-10-CM | POA: Diagnosis present

## 2019-05-22 DIAGNOSIS — E785 Hyperlipidemia, unspecified: Secondary | ICD-10-CM | POA: Diagnosis present

## 2019-05-22 DIAGNOSIS — R7401 Elevation of levels of liver transaminase levels: Secondary | ICD-10-CM | POA: Diagnosis present

## 2019-05-22 DIAGNOSIS — D72829 Elevated white blood cell count, unspecified: Secondary | ICD-10-CM | POA: Diagnosis present

## 2019-05-22 DIAGNOSIS — Z79899 Other long term (current) drug therapy: Secondary | ICD-10-CM

## 2019-05-22 DIAGNOSIS — R29701 NIHSS score 1: Secondary | ICD-10-CM | POA: Diagnosis present

## 2019-05-22 LAB — DIFFERENTIAL
Abs Immature Granulocytes: 0.13 10*3/uL — ABNORMAL HIGH (ref 0.00–0.07)
Basophils Absolute: 0 10*3/uL (ref 0.0–0.1)
Basophils Relative: 0 %
Eosinophils Absolute: 0 10*3/uL (ref 0.0–0.5)
Eosinophils Relative: 0 %
Immature Granulocytes: 1 %
Lymphocytes Relative: 11 %
Lymphs Abs: 1.3 10*3/uL (ref 0.7–4.0)
Monocytes Absolute: 0.3 10*3/uL (ref 0.1–1.0)
Monocytes Relative: 3 %
Neutro Abs: 9.5 10*3/uL — ABNORMAL HIGH (ref 1.7–7.7)
Neutrophils Relative %: 85 %

## 2019-05-22 LAB — I-STAT BETA HCG BLOOD, ED (MC, WL, AP ONLY): I-stat hCG, quantitative: <5 m[IU]/mL (ref <5)

## 2019-05-22 LAB — CBC
HCT: 40.1 % (ref 36.0–46.0)
Hemoglobin: 12.3 g/dL (ref 12.0–15.0)
MCH: 25.2 pg — ABNORMAL LOW (ref 26.0–34.0)
MCHC: 30.7 g/dL (ref 30.0–36.0)
MCV: 82.2 fL (ref 80.0–100.0)
Platelets: 403 10*3/uL — ABNORMAL HIGH (ref 150–400)
RBC: 4.88 MIL/uL (ref 3.87–5.11)
RDW: 16.4 % — ABNORMAL HIGH (ref 11.5–15.5)
WBC: 11.3 10*3/uL — ABNORMAL HIGH (ref 4.0–10.5)
nRBC: 0 % (ref 0.0–0.2)

## 2019-05-22 LAB — COMPREHENSIVE METABOLIC PANEL
ALT: 65 U/L — ABNORMAL HIGH (ref 0–44)
AST: 96 U/L — ABNORMAL HIGH (ref 15–41)
Albumin: 3.6 g/dL (ref 3.5–5.0)
Alkaline Phosphatase: 55 U/L (ref 38–126)
Anion gap: 11 (ref 5–15)
BUN: 9 mg/dL (ref 6–20)
CO2: 25 mmol/L (ref 22–32)
Calcium: 9.6 mg/dL (ref 8.9–10.3)
Chloride: 94 mmol/L — ABNORMAL LOW (ref 98–111)
Creatinine, Ser: 0.64 mg/dL (ref 0.44–1.00)
GFR calc Af Amer: >60 mL/min (ref >60)
GFR calc non Af Amer: >60 mL/min (ref >60)
Glucose, Bld: 125 mg/dL — ABNORMAL HIGH (ref 70–99)
Potassium: 3.9 mmol/L (ref 3.5–5.1)
Sodium: 130 mmol/L — ABNORMAL LOW (ref 135–145)
Total Bilirubin: 0.5 mg/dL (ref 0.3–1.2)
Total Protein: 8.2 g/dL — ABNORMAL HIGH (ref 6.5–8.1)

## 2019-05-22 LAB — I-STAT CHEM 8, ED
BUN: 9 mg/dL (ref 6–20)
Calcium, Ion: 1.13 mmol/L — ABNORMAL LOW (ref 1.15–1.40)
Chloride: 97 mmol/L — ABNORMAL LOW (ref 98–111)
Creatinine, Ser: 0.6 mg/dL (ref 0.44–1.00)
Glucose, Bld: 123 mg/dL — ABNORMAL HIGH (ref 70–99)
HCT: 39 % (ref 36.0–46.0)
Hemoglobin: 13.3 g/dL (ref 12.0–15.0)
Potassium: 3.9 mmol/L (ref 3.5–5.1)
Sodium: 136 mmol/L (ref 135–145)
TCO2: 31 mmol/L (ref 22–32)

## 2019-05-22 LAB — CBG MONITORING, ED: Glucose-Capillary: 120 mg/dL — ABNORMAL HIGH (ref 70–99)

## 2019-05-22 LAB — PROTIME-INR
INR: 1 (ref 0.8–1.2)
Prothrombin Time: 12.7 seconds (ref 11.4–15.2)

## 2019-05-22 LAB — APTT: aPTT: 30 seconds (ref 24–36)

## 2019-05-22 MED ORDER — SODIUM CHLORIDE 0.9% FLUSH
3.0000 mL | Freq: Once | INTRAVENOUS | Status: DC
Start: 2019-05-22 — End: 2019-05-24

## 2019-05-22 NOTE — ED Triage Notes (Signed)
Pt states dizziness started yesterday with nausea.  Pt then started vomiting today.  Pt has no drift on assessment of arms and legs, pupils 3RR, patient is off gait with standing, worse if eyes open.  CBG 120

## 2019-05-23 ENCOUNTER — Inpatient Hospital Stay (HOSPITAL_COMMUNITY): Payer: BC Managed Care – PPO

## 2019-05-23 ENCOUNTER — Emergency Department (HOSPITAL_COMMUNITY): Payer: BC Managed Care – PPO

## 2019-05-23 DIAGNOSIS — R112 Nausea with vomiting, unspecified: Secondary | ICD-10-CM | POA: Diagnosis present

## 2019-05-23 DIAGNOSIS — Z20822 Contact with and (suspected) exposure to covid-19: Secondary | ICD-10-CM | POA: Diagnosis present

## 2019-05-23 DIAGNOSIS — R7303 Prediabetes: Secondary | ICD-10-CM

## 2019-05-23 DIAGNOSIS — Z886 Allergy status to analgesic agent status: Secondary | ICD-10-CM | POA: Diagnosis not present

## 2019-05-23 DIAGNOSIS — I6381 Other cerebral infarction due to occlusion or stenosis of small artery: Secondary | ICD-10-CM | POA: Diagnosis present

## 2019-05-23 DIAGNOSIS — K760 Fatty (change of) liver, not elsewhere classified: Secondary | ICD-10-CM | POA: Diagnosis present

## 2019-05-23 DIAGNOSIS — Z8249 Family history of ischemic heart disease and other diseases of the circulatory system: Secondary | ICD-10-CM | POA: Diagnosis not present

## 2019-05-23 DIAGNOSIS — I6389 Other cerebral infarction: Secondary | ICD-10-CM

## 2019-05-23 DIAGNOSIS — D72829 Elevated white blood cell count, unspecified: Secondary | ICD-10-CM

## 2019-05-23 DIAGNOSIS — R7401 Elevation of levels of liver transaminase levels: Secondary | ICD-10-CM | POA: Diagnosis present

## 2019-05-23 DIAGNOSIS — R29701 NIHSS score 1: Secondary | ICD-10-CM | POA: Diagnosis present

## 2019-05-23 DIAGNOSIS — G4733 Obstructive sleep apnea (adult) (pediatric): Secondary | ICD-10-CM

## 2019-05-23 DIAGNOSIS — M5136 Other intervertebral disc degeneration, lumbar region: Secondary | ICD-10-CM | POA: Diagnosis present

## 2019-05-23 DIAGNOSIS — I639 Cerebral infarction, unspecified: Secondary | ICD-10-CM | POA: Diagnosis present

## 2019-05-23 DIAGNOSIS — E785 Hyperlipidemia, unspecified: Secondary | ICD-10-CM | POA: Diagnosis present

## 2019-05-23 DIAGNOSIS — M1990 Unspecified osteoarthritis, unspecified site: Secondary | ICD-10-CM | POA: Diagnosis present

## 2019-05-23 DIAGNOSIS — Z880 Allergy status to penicillin: Secondary | ICD-10-CM | POA: Diagnosis not present

## 2019-05-23 DIAGNOSIS — E871 Hypo-osmolality and hyponatremia: Secondary | ICD-10-CM | POA: Diagnosis present

## 2019-05-23 DIAGNOSIS — I10 Essential (primary) hypertension: Secondary | ICD-10-CM

## 2019-05-23 DIAGNOSIS — R4781 Slurred speech: Secondary | ICD-10-CM | POA: Diagnosis present

## 2019-05-23 DIAGNOSIS — Z833 Family history of diabetes mellitus: Secondary | ICD-10-CM | POA: Diagnosis not present

## 2019-05-23 DIAGNOSIS — Z79899 Other long term (current) drug therapy: Secondary | ICD-10-CM | POA: Diagnosis not present

## 2019-05-23 DIAGNOSIS — H538 Other visual disturbances: Secondary | ICD-10-CM | POA: Diagnosis present

## 2019-05-23 LAB — COMPREHENSIVE METABOLIC PANEL
ALT: 56 U/L — ABNORMAL HIGH (ref 0–44)
AST: 75 U/L — ABNORMAL HIGH (ref 15–41)
Albumin: 3.1 g/dL — ABNORMAL LOW (ref 3.5–5.0)
Alkaline Phosphatase: 49 U/L (ref 38–126)
Anion gap: 11 (ref 5–15)
BUN: 12 mg/dL (ref 6–20)
CO2: 27 mmol/L (ref 22–32)
Calcium: 9.5 mg/dL (ref 8.9–10.3)
Chloride: 99 mmol/L (ref 98–111)
Creatinine, Ser: 0.74 mg/dL (ref 0.44–1.00)
GFR calc Af Amer: >60 mL/min (ref >60)
GFR calc non Af Amer: >60 mL/min (ref >60)
Glucose, Bld: 83 mg/dL (ref 70–99)
Potassium: 3.6 mmol/L (ref 3.5–5.1)
Sodium: 137 mmol/L (ref 135–145)
Total Bilirubin: 0.7 mg/dL (ref 0.3–1.2)
Total Protein: 7.2 g/dL (ref 6.5–8.1)

## 2019-05-23 LAB — CBC
HCT: 41.2 % (ref 36.0–46.0)
Hemoglobin: 12.6 g/dL (ref 12.0–15.0)
MCH: 25.1 pg — ABNORMAL LOW (ref 26.0–34.0)
MCHC: 30.6 g/dL (ref 30.0–36.0)
MCV: 82.1 fL (ref 80.0–100.0)
Platelets: 386 10*3/uL (ref 150–400)
RBC: 5.02 MIL/uL (ref 3.87–5.11)
RDW: 16.4 % — ABNORMAL HIGH (ref 11.5–15.5)
WBC: 10.2 10*3/uL (ref 4.0–10.5)
nRBC: 0 % (ref 0.0–0.2)

## 2019-05-23 LAB — HIV ANTIBODY (ROUTINE TESTING W REFLEX): HIV Screen 4th Generation wRfx: NONREACTIVE

## 2019-05-23 LAB — RESPIRATORY PANEL BY RT PCR (FLU A&B, COVID)
Influenza A by PCR: NEGATIVE
Influenza B by PCR: NEGATIVE
SARS Coronavirus 2 by RT PCR: NEGATIVE

## 2019-05-23 LAB — ECHOCARDIOGRAM COMPLETE

## 2019-05-23 LAB — CBG MONITORING, ED: Glucose-Capillary: 77 mg/dL (ref 70–99)

## 2019-05-23 MED ORDER — CLOPIDOGREL BISULFATE 75 MG PO TABS
75.0000 mg | ORAL_TABLET | Freq: Every day | ORAL | Status: DC
  Administered 2019-05-24: 75 mg via ORAL
  Filled 2019-05-23: qty 1

## 2019-05-23 MED ORDER — ACETAMINOPHEN 325 MG PO TABS: 650.0000 mg | ORAL_TABLET | ORAL | Status: DC | PRN

## 2019-05-23 MED ORDER — ACETAMINOPHEN 160 MG/5ML PO SOLN: 650.0000 mg | ORAL | Status: DC | PRN

## 2019-05-23 MED ORDER — GABAPENTIN 300 MG PO CAPS
300.0000 mg | ORAL_CAPSULE | Freq: Three times a day (TID) | ORAL | Status: DC
  Administered 2019-05-23 – 2019-05-24 (×3): 300 mg via ORAL
  Filled 2019-05-23 (×3): qty 1

## 2019-05-23 MED ORDER — IOHEXOL 350 MG/ML SOLN
50.0000 mL | Freq: Once | INTRAVENOUS | Status: AC | PRN
  Administered 2019-05-23: 50 mL via INTRAVENOUS

## 2019-05-23 MED ORDER — CLOPIDOGREL BISULFATE 300 MG PO TABS
300.0000 mg | ORAL_TABLET | Freq: Once | ORAL | Status: AC
  Administered 2019-05-23: 300 mg via ORAL
  Filled 2019-05-23: qty 1

## 2019-05-23 MED ORDER — SODIUM CHLORIDE 0.9 % IV SOLN: INTRAVENOUS | Status: DC

## 2019-05-23 MED ORDER — SODIUM CHLORIDE 0.9 % IV SOLN: Freq: Once | INTRAVENOUS | Status: DC

## 2019-05-23 MED ORDER — ONDANSETRON 4 MG PO TBDP
8.0000 mg | ORAL_TABLET | Freq: Once | ORAL | Status: AC
  Administered 2019-05-23: 8 mg via ORAL
  Filled 2019-05-23: qty 2

## 2019-05-23 MED ORDER — ENOXAPARIN SODIUM 40 MG/0.4ML ~~LOC~~ SOLN
40.0000 mg | SUBCUTANEOUS | Status: DC
  Administered 2019-05-23 – 2019-05-24 (×2): 40 mg via SUBCUTANEOUS
  Filled 2019-05-23 (×2): qty 0.4

## 2019-05-23 MED ORDER — ACETAMINOPHEN 650 MG RE SUPP: 650.0000 mg | RECTAL | Status: DC | PRN

## 2019-05-23 MED ORDER — SENNOSIDES-DOCUSATE SODIUM 8.6-50 MG PO TABS: 1.0000 | ORAL_TABLET | Freq: Every evening | ORAL | Status: DC | PRN

## 2019-05-23 MED ORDER — STROKE: EARLY STAGES OF RECOVERY BOOK: Freq: Once | Status: DC

## 2019-05-23 NOTE — ED Notes (Addendum)
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

## 2019-05-23 NOTE — Progress Notes (Signed)
Echocardiogram 2D Echocardiogram has been performed.  Rhonda [name] 05/23/2019, 3:18 PM

## 2019-05-23 NOTE — ED Provider Notes (Addendum)
[facility] Provider Note   CSN: [id number] Arrival date & time: 05/22/19  1652     History Chief Complaint  Patient presents with  . Dizziness  . Emesis    Rhonda [name] is a 59 y.o. female.  Patient with hx htn, c/o dizziness, feeling unsteady on feet, for past day. Symptoms acute onset yesterday, moderate, dull, persistent but improving. No hx same. +associated nausea/vomiting. No bloody or bilious emesis. No headache. No hx vertigo. No ear pain, tinnitus, or hearing loss. Denies change in speech or vision. No new numbness or weakness. Denies sinus drainage/congestion or uri symptoms. No fever or chills. No recent change in meds or new meds. No lightheadedness or syncope. No chest pain or discomfort.   The history is provided by the patient.  Dizziness Associated symptoms: nausea and vomiting   Associated symptoms: no chest pain, no headaches and no shortness of breath   Emesis Associated symptoms: no abdominal pain, no chills, no cough, no fever, no headaches and no sore throat        Past Medical History:  Diagnosis Date  . Arthritis   . Degenerative disc disease, lumbar   . Hypertension   . Prediabetes     Patient Active Problem List   Diagnosis Date Noted  . Piriformis syndrome of right side 09/29/2016  . OSA (obstructive sleep apnea) 09/27/2015  . Right hip pain 04/11/2015  . Essential hypertension 11/07/2014  . Chronic low back pain 11/07/2014  . Prediabetes 11/07/2014  . Vitamin D deficiency 11/07/2014    Past Surgical History:  Procedure Laterality Date  . ABDOMINAL HYSTERECTOMY    . BREAST EXCISIONAL BIOPSY Right   . BREAST SURGERY     right breast  . CESAREAN SECTION       OB History    Gravida  5   Para      Term      Preterm      AB  1   Living  4     SAB  1   TAB      Ectopic      Multiple  1   Live Births  4           Family History  Problem Relation Age of Onset  . Cancer  Mother   . Hypertension Sister   . Diabetes Sister   . Diabetes Maternal Grandmother     Social History   Tobacco Use  . Smoking status: Never Smoker  . Smokeless tobacco: Never Used  Substance Use Topics  . Alcohol use: No  . Drug use: No    Home Medications Prior to Admission medications   Medication Sig Start Date End Date Taking? Authorizing Provider  azithromycin (ZITHROMAX) 250 MG tablet Take 1 tablet (250 mg total) by mouth daily. Take first 2 tablets together, then 1 every day until finished. 05/08/18   Vanessa Kick, MD  cholecalciferol (VITAMIN D) 1000 UNITS tablet Take 2,000 Units by mouth daily.    [provider]  cyclobenzaprine (FLEXERIL) 10 MG tablet Take 1 tablet (10 mg total) by mouth at bedtime. For 7 nights Patient not taking: Reported on 04/03/2017 09/25/16   Boykin Nearing, MD  gabapentin (NEURONTIN) 300 MG capsule TAKE 2 CAPSULES BY MOUTH 3 (THREE) TIMES DAILY. 03/24/18   Gildardo Pounds, NP  glucosamine-chondroitin 500-400 MG tablet Take 1 tablet by mouth 3 (three) times daily. 04/11/15   Boykin Nearing, MD  lisinopril-hydrochlorothiazide (PRINZIDE,ZESTORETIC) 10-12.5 MG  tablet Take 1 tablet by mouth daily. 10/05/17 01/03/18  Gildardo Pounds, NP  naproxen (NAPROSYN) 500 MG tablet TAKE 1 TABLET BY MOUTH 2 TIMES DAILY WITH FOOD AS NEEDED FOR MODERATE PAIN 04/03/17   Gildardo Pounds, NP  Omega-3 Fatty Acids (FISH OIL PO) Take by mouth.    [provider]  vitamin B-12 (CYANOCOBALAMIN) 1000 MCG tablet Take 1,000 mcg by mouth daily.    [provider]    Allergies    Aspirin and Augmentin [amoxicillin-pot clavulanate]  Review of Systems   Review of Systems  Constitutional: Negative for chills and fever.  HENT: Negative for sore throat and trouble swallowing.   Eyes: Negative for visual disturbance.  Respiratory: Negative for cough and shortness of breath.   Cardiovascular: Negative for chest pain.  Gastrointestinal: Positive  for nausea and vomiting. Negative for abdominal pain.  Genitourinary: Negative for dysuria and flank pain.  Musculoskeletal: Negative for back pain and neck pain.  Skin: Negative for rash.  Neurological: Positive for dizziness. Negative for headaches.  Hematological: Does not bruise/bleed easily.  Psychiatric/Behavioral: Negative for confusion.    Physical Exam Updated Vital Signs BP (!) 156/88   Pulse 80   Temp 98.2 F (36.8 C) (Oral)   Resp 15   SpO2 99%   Physical Exam Vitals and nursing note reviewed.  Constitutional:      Appearance: Normal appearance. She is well-developed.  HENT:     Head: Atraumatic.     Right Ear: Tympanic membrane normal.     Left Ear: Tympanic membrane normal.     Nose: Nose normal.     Mouth/Throat:     Mouth: Mucous membranes are moist.  Eyes:     General: No scleral icterus.    Conjunctiva/sclera: Conjunctivae normal.     Pupils: Pupils are equal, round, and reactive to light.  Neck:     Vascular: No carotid bruit.     Trachea: No tracheal deviation.  Cardiovascular:     Rate and Rhythm: Normal rate and regular rhythm.     Pulses: Normal pulses.     Heart sounds: Normal heart sounds. No murmur. No friction rub. No gallop.   Pulmonary:     Effort: Pulmonary effort is normal. No respiratory distress.     Breath sounds: Normal breath sounds.  Abdominal:     General: Bowel sounds are normal. There is no distension.     Palpations: Abdomen is soft.     Tenderness: There is no abdominal tenderness. There is no guarding.  Genitourinary:    Comments: No cva tenderness.  Musculoskeletal:        General: No swelling or tenderness.     Cervical back: Normal range of motion and neck supple. No rigidity. No muscular tenderness.     Right lower leg: No edema.     Left lower leg: No edema.  Skin:    General: Skin is warm and dry.     Findings: No rash.  Neurological:     Mental Status: She is alert and oriented to person, place, and time.      Cranial Nerves: No cranial nerve deficit.     Comments: Alert, speech normal/fluent, no aphasia. No facial asymmetry or weakness. Motor intact bil, stre 5/5. No pronator drift. Sensation grossly intact bil. Gait is abnormal, unsteady, leaning/near falling.   Psychiatric:        Mood and Affect: Mood normal.     ED Results / Procedures / Treatments  Labs (all labs ordered are listed, but only abnormal results are displayed)   Results for orders placed or performed during the hospital encounter of 05/22/19  Protime-INR  Result Value Ref Range   Prothrombin Time 12.7 11.4 - 15.2 seconds   INR 1.0 0.8 - 1.2  APTT  Result Value Ref Range   aPTT 30 24 - 36 seconds  CBC  Result Value Ref Range   WBC 11.3 (H) 4.0 - 10.5 K/uL   RBC 4.88 3.87 - 5.11 MIL/uL   Hemoglobin 12.3 12.0 - 15.0 g/dL   HCT 40.1 36.0 - 46.0 %   MCV 82.2 80.0 - 100.0 fL   MCH 25.2 (L) 26.0 - 34.0 pg   MCHC 30.7 30.0 - 36.0 g/dL   RDW 16.4 (H) 11.5 - 15.5 %   Platelets 403 (H) 150 - 400 K/uL   nRBC 0.0 0.0 - 0.2 %  Differential  Result Value Ref Range   Neutrophils Relative % 85 %   Neutro Abs 9.5 (H) 1.7 - 7.7 K/uL   Lymphocytes Relative 11 %   Lymphs Abs 1.3 0.7 - 4.0 K/uL   Monocytes Relative 3 %   Monocytes Absolute 0.3 0.1 - 1.0 K/uL   Eosinophils Relative 0 %   Eosinophils Absolute 0.0 0.0 - 0.5 K/uL   Basophils Relative 0 %   Basophils Absolute 0.0 0.0 - 0.1 K/uL   Immature Granulocytes 1 %   Abs Immature Granulocytes 0.13 (H) 0.00 - 0.07 K/uL  Comprehensive metabolic panel  Result Value Ref Range   Sodium 130 (L) 135 - 145 mmol/L   Potassium 3.9 3.5 - 5.1 mmol/L   Chloride 94 (L) 98 - 111 mmol/L   CO2 25 22 - 32 mmol/L   Glucose, Bld 125 (H) 70 - 99 mg/dL   BUN 9 6 - 20 mg/dL   Creatinine, Ser 0.64 0.44 - 1.00 mg/dL   Calcium 9.6 8.9 - 10.3 mg/dL   Total Protein 8.2 (H) 6.5 - 8.1 g/dL   Albumin 3.6 3.5 - 5.0 g/dL   AST 96 (H) 15 - 41 U/L   ALT 65 (H) 0 - 44 U/L   Alkaline Phosphatase 55  38 - 126 U/L   Total Bilirubin 0.5 0.3 - 1.2 mg/dL   GFR calc non Af Amer >60 >60 mL/min   GFR calc Af Amer >60 >60 mL/min   Anion gap 11 5 - 15  CBG monitoring, ED  Result Value Ref Range   Glucose-Capillary 120 (H) 70 - 99 mg/dL  I-stat chem 8, ED  Result Value Ref Range   Sodium 136 135 - 145 mmol/L   Potassium 3.9 3.5 - 5.1 mmol/L   Chloride 97 (L) 98 - 111 mmol/L   BUN 9 6 - 20 mg/dL   Creatinine, Ser 0.60 0.44 - 1.00 mg/dL   Glucose, Bld 123 (H) 70 - 99 mg/dL   Calcium, Ion 1.13 (L) 1.15 - 1.40 mmol/L   TCO2 31 22 - 32 mmol/L   Hemoglobin 13.3 12.0 - 15.0 g/dL   HCT 39.0 36.0 - 46.0 %  CBG monitoring, ED  Result Value Ref Range   Glucose-Capillary 77 70 - 99 mg/dL  I-Stat beta hCG blood, ED  Result Value Ref Range   I-stat hCG, quantitative <5.0 <5 mIU/mL   Comment 3           CT HEAD WO CONTRAST  Result Date: 05/22/2019 CLINICAL DATA:  Dizziness, nausea, possible stroke EXAM: CT HEAD WITHOUT CONTRAST  TECHNIQUE: Contiguous axial images were obtained from the base of the skull through the vertex without intravenous contrast. COMPARISON:  None. FINDINGS: Brain: No evidence of acute infarction, hemorrhage, hydrocephalus, extra-axial collection or mass lesion/mass effect. Vascular: No hyperdense vessel or unexpected calcification. Skull: Normal. Negative for fracture or focal lesion. Sinuses/Orbits: Partial fluid opacification of the right mastoid air cells, or alternately a small incidental venous lake of the mastoid (series 4, image 20). Other: None. IMPRESSION: 1.  No acute intracranial pathology. 2. Partial fluid opacification of the right mastoid air cells, or alternately a small incidental venous lake of the mastoid (series 4, image 20). Correlate for signs and symptoms of mastoiditis. Electronically Signed   By: Eddie Candle M.D.   On: 05/22/2019 20:04    EKG EKG Interpretation  Date/Time:  Sunday May 22 2019 17:48:47 EST Ventricular Rate:  80 PR Interval:  118 QRS  Duration: 86 QT Interval:  396 QTC Calculation: 456 R Axis:   31 Text Interpretation: Normal sinus rhythm Normal ECG No old tracing to compare Confirmed by Delora Fuel ([id number]) on 05/23/2019 12:00:29 AM   Radiology CT HEAD WO CONTRAST  Result Date: 05/22/2019 CLINICAL DATA:  Dizziness, nausea, possible stroke EXAM: CT HEAD WITHOUT CONTRAST TECHNIQUE: Contiguous axial images were obtained from the base of the skull through the vertex without intravenous contrast. COMPARISON:  None. FINDINGS: Brain: No evidence of acute infarction, hemorrhage, hydrocephalus, extra-axial collection or mass lesion/mass effect. Vascular: No hyperdense vessel or unexpected calcification. Skull: Normal. Negative for fracture or focal lesion. Sinuses/Orbits: Partial fluid opacification of the right mastoid air cells, or alternately a small incidental venous lake of the mastoid (series 4, image 20). Other: None. IMPRESSION: 1.  No acute intracranial pathology. 2. Partial fluid opacification of the right mastoid air cells, or alternately a small incidental venous lake of the mastoid (series 4, image 20). Correlate for signs and symptoms of mastoiditis. Electronically Signed   By: Eddie Candle M.D.   On: 05/22/2019 20:04   MR BRAIN WO CONTRAST  Result Date: 05/23/2019 CLINICAL DATA:  59 year old female with dizziness since yesterday with nausea. Vomiting began today. EXAM: MRI HEAD WITHOUT CONTRAST TECHNIQUE: Multiplanar, multiecho pulse sequences of the brain and surrounding structures were obtained without intravenous contrast. COMPARISON:  Head CT 05/22/2019. FINDINGS: Brain: There is a patchy linear array of restricted diffusion in the brainstem affecting the left paracentral pons (series 5, image 68 and series 6, image 16). Mild associated T2 and FLAIR hyperintensity. No associated hemorrhage or mass effect. No other restricted diffusion identified. Normal cerebral volume. No midline shift, mass effect, evidence of mass lesion,  ventriculomegaly, extra-axial collection or acute intracranial hemorrhage. Cervicomedullary junction and pituitary are within normal limits. Outside of the pons gray and white matter signal is normal for age; minimal nonspecific cerebral white matter T2 and FLAIR hyperintensity. No chronic cerebral blood products identified. Vascular: Major intracranial vascular flow voids are preserved. Skull and upper cervical spine: Partially visible cervical spine disc and endplate degeneration. Mild if any upper cervical degenerative spinal stenosis. Visualized bone marrow signal is within normal limits. Absent dentition. Sinuses/Orbits: Mildly Disconjugate gaze, otherwise negative orbits. Bubbly opacity again noted in the right sphenoid sinus but the remaining paranasal sinuses are clear. Other: Posterior right mastoid air cell fluid again noted. The right tympanic cavity and remaining mastoids remain clear. Negative nasopharynx. Otherwise negative visible internal auditory structures. Scalp and face soft tissues appear negative. IMPRESSION: 1. 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 No associated hemorrhage or mass effect. 2. Otherwise negative for age noncontrast MRI appearance of the brain. 3. Mild inflammation in the right sphenoid sinus. Mild fluid in the posterior right mastoid air cells, probably postinflammatory. Electronically Signed   By: Genevie Ann M.D.   On: 05/23/2019 09:26    Procedures Procedures (including critical care time)  Medications Ordered in ED Medications  sodium chloride flush (NS) 0.9 % injection 3 mL (has no administration in time range)  ondansetron (ZOFRAN-ODT) disintegrating tablet 8 mg (8 mg Oral Given 05/23/19 0850)    ED Course  I have reviewed the triage vital signs and the nursing notes.  Pertinent labs & imaging results that were available during my care of the patient were reviewed by me and considered in my medical  decision making (see chart for details).    MDM Rules/Calculators/A&P                     Labs and imaging ordered from triage.   Reviewed nursing notes and prior charts for additional history.   On my eval, symptoms improved from prior, but persist. +mild nausea. zofran po. Patient does have unsteady gait.  Will get MRI.   CT reviewed/interpreted by me - no acute hem.  Labs reviewed/interpreted by me - Na mildly low.   MRI reviewed/interpreted by me - acute lacunar cva.  Neurology consulted - discussed pt - they will see, requests medical service admission. Unassigned medicine consulted for admission.         Final Clinical Impression(s) / ED Diagnoses Final diagnoses:  None    Rx / DC Orders ED Discharge Orders    None       Lajean Saver, MD 05/23/19 1027    Lajean Saver, MD 05/23/19 1041

## 2019-05-23 NOTE — ED Notes (Signed)
Pt talking on phone, updating family herself.

## 2019-05-23 NOTE — Consult Note (Signed)
Neurology Consultation  Reason for Consult: Stroke Referring Physician: Fuller Plan  CC: Stroke  History is obtained from: Patient  HPI: Rhonda [name] is a 59 y.o. female prediabetes,OSA, hypertension and arthritis.  Patient brought herself to the hospital secondary to having sudden onset of nausea and vomiting which was not subsiding.  While at hospital MRI was obtained which did show a stroke thus neurology was consulted.  Patient states that she was fine until about 9:00 in the morning on Sunday, 05/22/2019.  She suddenly noted that she could not keep any food and or liquid down without throwing it up.  This continued along with gait difficulty and feeling dizzy/woozy in the head.  She also noted that she had blurry vision on the right visual field which has now resolved.  She drove itself to the hospital at approximately 5 PM that day.  Due to the hospital being significantly busy she remained in the waiting room all night and was brought back to the ED bed this morning.  It was at that time that the MRI was obtained.  Currently patient states that she has no nausea or vomiting and feels significantly better.  Patient denies smoking, and also does not take aspirin on a daily basis.  She knows that she is prediabetic.   ED course labs, MRI brain   Chart review patient has been seen by neurology for sleep study in the past over at [facility].  At that time she did show severe obstructive sleep apnea.   LKW: 05/22/2019 at 9 AM tpa given?: no, out of window Premorbid modified Rankin scale (mRS): 0 NIH stroke scale of 0  Past Medical History:  Diagnosis Date  . Arthritis   . Degenerative disc disease, lumbar   . Hypertension   . Prediabetes     Family History  Problem Relation Age of Onset  . Cancer Mother   . Hypertension Sister   . Diabetes Sister   . Diabetes Maternal Grandmother    Social History:   reports that she has never smoked. She has never used smokeless tobacco.  She reports that she does not drink alcohol or use drugs.  Medications  Current Facility-Administered Medications:  .   stroke: mapping our early stages of recovery book, , Does not apply, Once, Smith, Rondell A, MD .  0.9 %  sodium chloride infusion, , Intravenous, Once, Smith, Rondell A, MD .  acetaminophen (TYLENOL) tablet 650 mg, 650 mg, Oral, Q4H PRN **OR** acetaminophen (TYLENOL) 160 MG/5ML solution 650 mg, 650 mg, Per Tube, Q4H PRN **OR** acetaminophen (TYLENOL) suppository 650 mg, 650 mg, Rectal, Q4H PRN, Smith, Rondell A, MD .  enoxaparin (LOVENOX) injection 40 mg, 40 mg, Subcutaneous, Q24H, Smith, Rondell A, MD .  senna-docusate (Senokot-S) tablet 1 tablet, 1 tablet, Oral, QHS PRN, Smith, Rondell A, MD .  sodium chloride flush (NS) 0.9 % injection 3 mL, 3 mL, Intravenous, Once, Smith, Rondell A, MD  Current Outpatient Medications:  .  azithromycin (ZITHROMAX) 250 MG tablet, Take 1 tablet (250 mg total) by mouth daily. Take first 2 tablets together, then 1 every day until finished., Disp: 6 tablet, Rfl: 0 .  cholecalciferol (VITAMIN D) 1000 UNITS tablet, Take 2,000 Units by mouth daily., Disp: , Rfl:  .  cyclobenzaprine (FLEXERIL) 10 MG tablet, Take 1 tablet (10 mg total) by mouth at bedtime. For 7 nights (Patient not taking: Reported on 04/03/2017), Disp: 7 tablet, Rfl: 0 .  gabapentin (NEURONTIN) 300 MG capsule, TAKE 2  CAPSULES BY MOUTH 3 (THREE) TIMES DAILY., Disp: 180 capsule, Rfl: 3 .  glucosamine-chondroitin 500-400 MG tablet, Take 1 tablet by mouth 3 (three) times daily., Disp: 90 tablet, Rfl: 2 .  lisinopril-hydrochlorothiazide (PRINZIDE,ZESTORETIC) 10-12.5 MG tablet, Take 1 tablet by mouth daily., Disp: 90 tablet, Rfl: 1 .  naproxen (NAPROSYN) 500 MG tablet, TAKE 1 TABLET BY MOUTH 2 TIMES DAILY WITH FOOD AS NEEDED FOR MODERATE PAIN, Disp: 60 tablet, Rfl: 1 .  Omega-3 Fatty Acids (FISH OIL PO), Take by mouth., Disp: , Rfl:  .  vitamin B-12 (CYANOCOBALAMIN) 1000 MCG tablet, Take  1,000 mcg by mouth daily., Disp: , Rfl:   ROS:  General ROS: negative for - chills, fatigue, fever, night sweats, weight gain or weight loss Psychological ROS: negative for - behavioral disorder, hallucinations, memory difficulties, mood swings or suicidal ideation Ophthalmic ROS: Positive for - blurry vision,  ENT ROS: negative for - epistaxis, nasal discharge, oral lesions, sore throat, tinnitus or vertigo Endocrine ROS: negative for - galactorrhea, hair pattern changes, polydipsia/polyuria or temperature intolerance Respiratory ROS: negative for - cough, hemoptysis, shortness of breath or wheezing Cardiovascular ROS: negative for - chest pain, dyspnea on exertion, edema or irregular heartbeat Gastrointestinal ROS: Positive for - nausea/vomiting  Genito-Urinary ROS: negative for - dysuria, hematuria, incontinence or urinary frequency/urgency Musculoskeletal ROS: negative for - joint swelling or muscular weakness Neurological ROS: as noted in HPI Dermatological ROS: negative for rash and skin lesion changes  Exam: Current vital signs: BP 116/79   Pulse 75   Temp 98.2 F (36.8 C) (Oral)   Resp 15   SpO2 100%  Vital signs in last 24 hours: Temp:  [98.2 F (36.8 C)-98.4 F (36.9 C)] 98.2 F (36.8 C) (01/04 0334) Pulse Rate:  [53-82] 75 (01/04 1200) Resp:  [14-30] 15 (01/04 1200) BP: (116-157)/(71-114) 116/79 (01/04 1200) SpO2:  [95 %-100 %] 100 % (01/04 1200)   Constitutional: Appears well-developed and well-nourished.  Psych: Affect appropriate to situation Eyes: No scleral injection HENT: No OP obstrucion Head: Normocephalic.  Cardiovascular: Normal rate and regular rhythm.  Respiratory: Effort normal, non-labored breathing GI: Soft.  No distension. There is no tenderness.  Skin: WDI  Neuro: Mental Status: Patient is awake, alert, oriented to person, place, month, year, and situation. Speech-normal with naming, repeating ,comprehension Patient is able to give a clear  and coherent history. Cranial Nerves: II: Visual Fields are full.  III,IV, VI: EOMI without ptosis or diploplia. Pupils equal, round and reactive to light V: Facial sensation is symmetric to temperature VII: Facial movement is symmetric.  VIII: hearing is intact to voice X: Palat elevates symmetrically XI: Shoulder shrug is symmetric. XII: tongue is midline without atrophy or fasciculations.  Motor: Tone is normal. Bulk is normal.  5/5 strength throughout  sensory: Sensation is symmetric to light touch and temperature in the arms and legs. Deep Tendon Reflexes: 2+ and symmetric in the biceps and patellae.  Plantars: Toes are downgoing bilaterally.  Cerebellar: Patient has impaired heel-to-shin on the right leg, mild dysmetria in the right arm  Labs I have reviewed labs in epic and the results pertinent to this consultation are:   CBC    Component Value Date/Time   WBC 11.3 (H) 05/22/2019 1811   RBC 4.88 05/22/2019 1811   HGB 13.3 05/22/2019 1825   HCT 39.0 05/22/2019 1825   PLT 403 (H) 05/22/2019 1811   MCV 82.2 05/22/2019 1811   MCH 25.2 (L) 05/22/2019 1811   MCHC 30.7 05/22/2019 1811  RDW 16.4 (H) 05/22/2019 1811   LYMPHSABS 1.3 05/22/2019 1811   MONOABS 0.3 05/22/2019 1811   EOSABS 0.0 05/22/2019 1811   BASOSABS 0.0 05/22/2019 1811    CMP     Component Value Date/Time   NA 136 05/22/2019 1825   NA 142 02/10/2018 1639   K 3.9 05/22/2019 1825   CL 97 (L) 05/22/2019 1825   CO2 25 05/22/2019 1811   GLUCOSE 123 (H) 05/22/2019 1825   BUN 9 05/22/2019 1825   BUN 18 02/10/2018 1639   CREATININE 0.60 05/22/2019 1825   CREATININE 0.85 08/02/2015 1710   CALCIUM 9.6 05/22/2019 1811   PROT 8.2 (H) 05/22/2019 1811   PROT 6.9 02/10/2018 1639   ALBUMIN 3.6 05/22/2019 1811   ALBUMIN 4.0 02/10/2018 1639   AST 96 (H) 05/22/2019 1811   ALT 65 (H) 05/22/2019 1811   ALKPHOS 55 05/22/2019 1811   BILITOT 0.5 05/22/2019 1811   BILITOT <0.2 02/10/2018 1639   GFRNONAA >60  05/22/2019 1811   GFRNONAA 78 08/02/2015 1710   GFRAA >60 05/22/2019 1811   GFRAA >89 08/02/2015 1710    Lipid Panel     Component Value Date/Time   CHOL 213 (H) 07/07/2017 0932   TRIG 182 (H) 07/07/2017 0932   HDL 40 07/07/2017 0932   CHOLHDL 5.3 (H) 07/07/2017 0932   CHOLHDL 4.2 06/15/2012 1124   VLDL 26 06/15/2012 1124   LDLCALC 137 (H) 07/07/2017 0932     Imaging I have reviewed the images obtained:  CT-scan of the brain-no acute intracranial pathology  MRI examination of the brain-positive acute looking type infarct in the left paracentral pons.  Otherwise negative for age noncontrast MRI.  Etta Quill PA-C [facility] [phone]  M-F  (9:00 am- 5:00 PM)  05/23/2019, 12:47 PM     Assessment: This is a 59 year old female presenting to the hospital secondary to sudden onset of nausea vomiting and dizziness.  At this time patient symptoms have resolved however MRI that was obtained did show a acute small vessel lacunar type infarct in the left paracentral pons.  Currently patient does not take aspirin.  At this time patient will need a stroke work-up in the hospital.  While in ED patient's blood pressure has remained relatively normotensive, patient is afebrile, pulse has remained in the 70-80 bpm range  Impression: -Small vessel pontine stroke -Nausea vomiting which has resolved -Right visual field blurriness which has resolved  Recommend #CTA head neck #Transthoracic Echo, # Start patient on ASA 81mg  EC, Plavix 75 mg (after 300mg  load)  #Start Atorvastatin 80 mg/other high intensity statin # BP goal: permissive HTN upto 220/120 mmHg # HBAIC and Lipid profile # Telemetry monitoring # Frequent neuro checks # NPO until passes stroke swallow screen #PT OT # please page stroke NP  Or  PA  Or MD from 8am -4 pm  as this patient from this time will be  followed by the stroke.   You can look them up on [email or web address]  Password [email or web address]   NEUROHOSPITALIST  ADDENDUM Performed a face to face diagnostic evaluation.   I have reviewed the contents of history and physical exam as documented by PA/ARNP/Resident and agree with above documentation.  I have discussed and formulated the above plan as documented. Edits to the note have been made as needed.  59 year old female with past medical history significant for obesity, obstructive sleep apnea, hypertension, prediabetes, ?  Hyperlipidemia presents to the ED with 2-day history of gait imbalance, dizziness, nausea.  On exam she has mild on the right hand as well as impaired heel-to-shin the right leg no significant weakness noted.  MRI brain shows midline left pontine infarct likely small vessel disease  Patient has several vascular risk factors which are probably playing a role, need to be addressed this admission.  She is not on any antiplatelet medication at home, as stated in the past that aspirin irritates her stomach so we will start her on enteric-coated aspirin. Also treated with Plavix for 3 weeks based on CHANCE and POINT trial studies.   Work-up as stated above, stroke team will follow.   Karena Addison Aroor MD [facility] [phone]   If 7pm to 7am, please call on call as listed on AMION.

## 2019-05-23 NOTE — ED Notes (Signed)
Patient transported to MRI 

## 2019-05-23 NOTE — ED Notes (Signed)
Neurology at bedside.

## 2019-05-23 NOTE — ED Notes (Signed)
Pt given sprite and crackers  

## 2019-05-23 NOTE — ED Notes (Signed)
Patient transported to X-ray 

## 2019-05-23 NOTE — H&P (Signed)
History and Physical    [name] [id number] DOB: 03-19-1961 DOA: 05/22/2019  Referring MD/NP/PA: Lajean Saver, MD PCP: Gildardo Pounds, NP  Patient coming from: Home  Chief Complaint: Dizziness  I have personally briefly reviewed patient's old medical records in [place]   HPI: Rhonda [name] is a 59 y.o. female with medical history significant of hypertension, arthritis, and prediabetes.  Patient presents with complaints of dizziness that initially started 2 days ago.  Describes dizziness as a lightheaded feeling with associated symptoms of nausea.  However, yesterday symptoms worsened to the point where she was off balance with walking and was unable to keep anything that she ate down.  She vomited several times and emesis was reported to be nonbloody in appearance.  Associated symptoms included blurry vision in her left eye and some slurred speech.  Patient still feels like her balance is off, but all other symptoms have otherwise resolved.  ED Course: Upon admission to the emergency department patient was noted to be afebrile, pulse 53-82, respirations 14-30, blood pressures 116/79-157/114, and O2 saturations maintained on room air.  CT scan of the brain showed no acute abnormalities.  Neurology was consulted.  Labs for WBC 11.3, platelets 403, sodium 130, AST 96, and ALT 65.  MRI was positive for a acute lacunar infarct of the left paracentral pons.  Patient was given 300 mg of Plavix loading dose.  TR[facility] called to admit to complete stroke work-up.  Respiratory virus panel pending  Review of Systems  Constitutional: Negative for chills, fever and malaise/fatigue.  HENT: Negative for ear discharge and nosebleeds.   Eyes: Positive for blurred vision. Negative for photophobia and pain.  Respiratory: Positive for cough and shortness of breath.   Cardiovascular: Negative for chest pain and leg swelling.  Gastrointestinal: Positive for nausea and vomiting. Negative for diarrhea.   Genitourinary: Negative for dysuria and hematuria.  Musculoskeletal: Negative for joint pain and myalgias.  Skin: Negative for itching.  Neurological: Negative for focal weakness and loss of consciousness.  Psychiatric/Behavioral: Negative for substance abuse. The patient is not nervous/anxious.     Past Medical History:  Diagnosis Date  . Arthritis   . Degenerative disc disease, lumbar   . Hypertension   . Prediabetes     Past Surgical History:  Procedure Laterality Date  . ABDOMINAL HYSTERECTOMY    . BREAST EXCISIONAL BIOPSY Right   . BREAST SURGERY     right breast  . CESAREAN SECTION       reports that she has never smoked. She has never used smokeless tobacco. She reports that she does not drink alcohol or use drugs.  Allergies  Allergen Reactions  . Aspirin   . Augmentin [Amoxicillin-Pot Clavulanate]     Family History  Problem Relation Age of Onset  . Cancer Mother   . Hypertension Sister   . Diabetes Sister   . Diabetes Maternal Grandmother     Prior to Admission medications   Medication Sig Start Date End Date Taking? Authorizing Provider  azithromycin (ZITHROMAX) 250 MG tablet Take 1 tablet (250 mg total) by mouth daily. Take first 2 tablets together, then 1 every day until finished. 05/08/18   Vanessa Kick, MD  cholecalciferol (VITAMIN D) 1000 UNITS tablet Take 2,000 Units by mouth daily.    [provider]  cyclobenzaprine (FLEXERIL) 10 MG tablet Take 1 tablet (10 mg total) by mouth at bedtime. For 7 nights Patient not taking: Reported on 04/03/2017 09/25/16   Funches,  Josalyn, MD  gabapentin (NEURONTIN) 300 MG capsule TAKE 2 CAPSULES BY MOUTH 3 (THREE) TIMES DAILY. 03/24/18   Gildardo Pounds, NP  glucosamine-chondroitin 500-400 MG tablet Take 1 tablet by mouth 3 (three) times daily. 04/11/15   Funches, Adriana Mccallum, MD  lisinopril-hydrochlorothiazide (PRINZIDE,ZESTORETIC) 10-12.5 MG tablet Take 1 tablet by mouth daily. 10/05/17 01/03/18  Gildardo Pounds, NP  naproxen (NAPROSYN) 500 MG tablet TAKE 1 TABLET BY MOUTH 2 TIMES DAILY WITH FOOD AS NEEDED FOR MODERATE PAIN 04/03/17   Gildardo Pounds, NP  Omega-3 Fatty Acids (FISH OIL PO) Take by mouth.    [provider]  vitamin B-12 (CYANOCOBALAMIN) 1000 MCG tablet Take 1,000 mcg by mouth daily.    [provider]    Physical Exam:  Constitutional: Middle-aged female who appears to be in no acute distress at this time. Vitals:   05/23/19 1030 05/23/19 1045 05/23/19 1100 05/23/19 1130  BP: 138/78 130/79 131/71 (!) 142/93  Pulse: 81 81 79 (!) 53  Resp: 19 16 15 16   Temp:      TempSrc:      SpO2: 100% 100% 100% 98%   Eyes: PERRL, lids and conjunctivae normal ENMT: Mucous membranes are moist. Posterior pharynx clear of any exudate or lesions.  Neck: normal, supple, no masses, no thyromegaly Respiratory: clear to auscultation bilaterally, no wheezing, no crackles. Normal respiratory effort. No accessory muscle use.  Cardiovascular: Regular rate and rhythm, no murmurs / rubs / gallops. No extremity edema. 2+ pedal pulses. No carotid bruits.  Abdomen: no tenderness, no masses palpated. No hepatosplenomegaly. Bowel sounds positive.  Musculoskeletal: no clubbing / cyanosis. No joint deformity upper and lower extremities. Good ROM, no contractures. Normal muscle tone.  Skin: no rashes, lesions, ulcers. No induration Neurologic: CN 2-12 grossly intact. Sensation intact, DTR normal. Strength 5/5 in all 4.  Gait not assessed. Psychiatric: Normal judgment and insight. Alert and oriented x 3. Normal mood.     Labs on Admission: I have personally reviewed following labs and imaging studies  CBC: Recent Labs  Lab 05/22/19 1811 05/22/19 1825  WBC 11.3*  --   NEUTROABS 9.5*  --   HGB 12.3 13.3  HCT 40.1 39.0  MCV 82.2  --   PLT 403*  --    Basic Metabolic Panel: Recent Labs  Lab 05/22/19 1811 05/22/19 1825  NA 130* 136  K 3.9 3.9  CL 94* 97*  CO2 25  --   GLUCOSE  125* 123*  BUN 9 9  CREATININE 0.64 0.60  CALCIUM 9.6  --    GFR: CrCl cannot be calculated (Unknown ideal weight.). Liver Function Tests: Recent Labs  Lab 05/22/19 1811  AST 96*  ALT 65*  ALKPHOS 55  BILITOT 0.5  PROT 8.2*  ALBUMIN 3.6   No results for input(s): LIPASE, AMYLASE in the last 168 hours. No results for input(s): AMMONIA in the last 168 hours. Coagulation Profile: Recent Labs  Lab 05/22/19 1811  INR 1.0   Cardiac Enzymes: No results for input(s): CKTOTAL, CKMB, CKMBINDEX, TROPONINI in the last 168 hours. BNP (last 3 results) No results for input(s): PROBNP in the last 8760 hours. HbA1C: No results for input(s): HGBA1C in the last 72 hours. CBG: Recent Labs  Lab 05/22/19 1748 05/23/19 0759  GLUCAP 120* 77   Lipid Profile: No results for input(s): CHOL, HDL, LDLCALC, TRIG, CHOLHDL, LDLDIRECT in the last 72 hours. Thyroid Function Tests: No results for input(s): TSH, T4TOTAL, FREET4, T3FREE, THYROIDAB in the last  72 hours. Anemia Panel: No results for input(s): VITAMINB12, FOLATE, FERRITIN, TIBC, IRON, RETICCTPCT in the last 72 hours. Urine analysis:    Component Value Date/Time   APPEARANCEUR Clear 02/10/2018 1639   GLUCOSEU Negative 02/10/2018 1639   BILIRUBINUR Negative 02/10/2018 1639   PROTEINUR Negative 02/10/2018 1639   NITRITE Negative 02/10/2018 1639   LEUKOCYTESUR Negative 02/10/2018 1639   Sepsis Labs: No results found for this or any previous visit (from the past 240 hour(s)).   Radiological Exams on Admission: CT HEAD WO CONTRAST  Result Date: 05/22/2019 CLINICAL DATA:  Dizziness, nausea, possible stroke EXAM: CT HEAD WITHOUT CONTRAST TECHNIQUE: Contiguous axial images were obtained from the base of the skull through the vertex without intravenous contrast. COMPARISON:  None. FINDINGS: Brain: No evidence of acute infarction, hemorrhage, hydrocephalus, extra-axial collection or mass lesion/mass effect. Vascular: No hyperdense vessel or  unexpected calcification. Skull: Normal. Negative for fracture or focal lesion. Sinuses/Orbits: Partial fluid opacification of the right mastoid air cells, or alternately a small incidental venous lake of the mastoid (series 4, image 20). Other: None. IMPRESSION: 1.  No acute intracranial pathology. 2. Partial fluid opacification of the right mastoid air cells, or alternately a small incidental venous lake of the mastoid (series 4, image 20). Correlate for signs and symptoms of mastoiditis. Electronically Signed   By: Eddie Candle M.D.   On: 05/22/2019 20:04   MR BRAIN WO CONTRAST  Result Date: 05/23/2019 CLINICAL DATA:  59 year old female with dizziness since yesterday with nausea. Vomiting began today. EXAM: MRI HEAD WITHOUT CONTRAST TECHNIQUE: Multiplanar, multiecho pulse sequences of the brain and surrounding structures were obtained without intravenous contrast. COMPARISON:  Head CT 05/22/2019. FINDINGS: Brain: There is a patchy linear array of restricted diffusion in the brainstem affecting the left paracentral pons (series 5, image 68 and series 6, image 16). Mild associated T2 and FLAIR hyperintensity. No associated hemorrhage or mass effect. No other restricted diffusion identified. Normal cerebral volume. No midline shift, mass effect, evidence of mass lesion, ventriculomegaly, extra-axial collection or acute intracranial hemorrhage. Cervicomedullary junction and pituitary are within normal limits. Outside of the pons gray and white matter signal is normal for age; minimal nonspecific cerebral white matter T2 and FLAIR hyperintensity. No chronic cerebral blood products identified. Vascular: Major intracranial vascular flow voids are preserved. Skull and upper cervical spine: Partially visible cervical spine disc and endplate degeneration. Mild if any upper cervical degenerative spinal stenosis. Visualized bone marrow signal is within normal limits. Absent dentition. Sinuses/Orbits: Mildly Disconjugate  gaze, otherwise negative orbits. Bubbly opacity again noted in the right sphenoid sinus but the remaining paranasal sinuses are clear. Other: Posterior right mastoid air cell fluid again noted. The right tympanic cavity and remaining mastoids remain clear. Negative nasopharynx. Otherwise negative visible internal auditory structures. Scalp and face soft tissues appear negative. IMPRESSION: 1. Positive for acute lacunar type infarct in the left paracentral pons (pontine perforator branch). No associated hemorrhage or mass effect. 2. Otherwise negative for age noncontrast MRI appearance of the brain. 3. Mild inflammation in the right sphenoid sinus. Mild fluid in the posterior right mastoid air cells, probably postinflammatory. Electronically Signed   By: Genevie Ann M.D.   On: 05/23/2019 09:26    EKG: Independently reviewed.  Sinus rhythm 82 bpm with nonspecific ST wave changes Assessment/Plan CVA: Acute.  Patient presents with complaints of dizziness which initially started 2 days ago.  MRI revealed and acute lacunar infarct of the left paracentral pons.  Patient reports allergy to aspirin.  She was loaded with 300 mg of Plavix. -Admit to telemetry bed -Stroke order set initiated -Neuro checks -Check Hemoglobin A1c and lipid panel in a.m. -Check CTA of the head and neck -Check echocardiogram -PT/OT/Speech to eval and treat -Plavix -Appreciate neurology consultative services, will follow-up   Leukocytosis: Acute.  WBC elevated 11.3 on admission.  Suspect that this is reactive in nature. -Continue to monitor  Nausea and vomiting: Resolved.  Likely secondary to above. -Antiemetics as needed  Essential hypertension : Blood pressures are up to 157/114.  Patient normally on lisinopril-hydrochlorothiazide. -Allow for permissive hypertension at this time -Restart patient's lisinopril hydrochlorothiazide when medically appropriate  Hyponatremia: Acute sodium 130 on admission.  Patient on diuretic and  reported recent nausea and vomiting symptoms. -Gentle IV fluids at 75 mL/h -Recheck sodium level  Transaminitis: Acute.  Initial AST 96 and ALT 65.  Patient denies any history of alcohol use. -IV fluids -Recheck levels in am  Prediabetes: Patient not on any diabetic medications. -Follow-up hemoglobin A1c  OSA -Patient to have family bring her CPAP machine otherwise RT to supply  DVT prophylaxis: Lovenox Code Status: Full Family Communication: No family requested to be updated at this time. Disposition Plan: Possible discharge home in 2 to 3 days Consults called: Neurology Admission status: inpatient   Norval Morton MD [facility] Pager [phone]   If 7PM-7AM, please contact night-coverage [email or web address] Password [email or web address]  05/23/2019, 12:15 PM

## 2019-05-24 LAB — BASIC METABOLIC PANEL
Anion gap: 10 (ref 5–15)
BUN: 11 mg/dL (ref 6–20)
CO2: 26 mmol/L (ref 22–32)
Calcium: 9.2 mg/dL (ref 8.9–10.3)
Chloride: 101 mmol/L (ref 98–111)
Creatinine, Ser: 0.73 mg/dL (ref 0.44–1.00)
GFR calc Af Amer: >60 mL/min (ref >60)
GFR calc non Af Amer: >60 mL/min (ref >60)
Glucose, Bld: 91 mg/dL (ref 70–99)
Potassium: 3.9 mmol/L (ref 3.5–5.1)
Sodium: 137 mmol/L (ref 135–145)

## 2019-05-24 LAB — HEPATIC FUNCTION PANEL
ALT: 50 U/L — ABNORMAL HIGH (ref 0–44)
AST: 62 U/L — ABNORMAL HIGH (ref 15–41)
Albumin: 3 g/dL — ABNORMAL LOW (ref 3.5–5.0)
Alkaline Phosphatase: 47 U/L (ref 38–126)
Bilirubin, Direct: <0.1 mg/dL (ref 0.0–0.2)
Total Bilirubin: 0.4 mg/dL (ref 0.3–1.2)
Total Protein: 7.1 g/dL (ref 6.5–8.1)

## 2019-05-24 LAB — LIPID PANEL
Cholesterol: 248 mg/dL — ABNORMAL HIGH (ref 0–200)
HDL: 35 mg/dL — ABNORMAL LOW (ref >40)
LDL Cholesterol: 179 mg/dL — ABNORMAL HIGH (ref 0–99)
Total CHOL/HDL Ratio: 7.1 RATIO
Triglycerides: 172 mg/dL — ABNORMAL HIGH (ref <150)
VLDL: 34 mg/dL (ref 0–40)

## 2019-05-24 MED ORDER — CLOPIDOGREL BISULFATE 75 MG PO TABS: 75.0000 mg | ORAL_TABLET | Freq: Every day | ORAL | 0 refills | Status: DC

## 2019-05-24 MED ORDER — ATORVASTATIN CALCIUM 80 MG PO TABS
80.0000 mg | ORAL_TABLET | Freq: Every day | ORAL | Status: DC
  Administered 2019-05-24: 80 mg via ORAL
  Filled 2019-05-24: qty 1

## 2019-05-24 MED ORDER — ASPIRIN EC 81 MG PO TBEC: 81.0000 mg | DELAYED_RELEASE_TABLET | Freq: Every day | ORAL | 11 refills | Status: AC

## 2019-05-24 MED ORDER — ATORVASTATIN CALCIUM 80 MG PO TABS: 80.0000 mg | ORAL_TABLET | Freq: Every day | ORAL | 2 refills | Status: DC

## 2019-05-24 MED FILL — ATORVASTATIN CALCIUM 80 MG: 80 | 30 days supply | Qty: 30 | Fill #0

## 2019-05-24 MED FILL — ASPIRIN LOW DOSE 81 MG TBEC: 81 | 30 days supply | Qty: 30 | Fill #0

## 2019-05-24 MED FILL — CLOPIDOGREL 75 MG TABLET: 75 | 21 days supply | Qty: 21 | Fill #0

## 2019-05-24 NOTE — Discharge Summary (Signed)
Physician Discharge Summary  Rhonda [name] [id number] DOB: 1960-10-04 DOA: 05/22/2019  PCP: Gildardo Pounds, NP  Admit date: 05/22/2019 Discharge date: 05/24/2019  Admitted From: Home Disposition:  Home with outpatient PT OT   Recommendations for Outpatient Follow-up:  1. Follow up with PCP in 1 week 2. Follow up with Neurology in 4-6 weeks 3. Follow up on Ha1c result   Discharge Condition: Stable CODE STATUS: Full  Diet recommendation: Heart healthy   Brief/Interim Summary: Rhonda [name] is a 59 y.o.femalewith medical history significant ofhypertension, arthritis, and prediabetes. Patient presents with complaints of dizziness that initially started 2 days ago. Describes dizziness as a lightheaded feeling with associated symptoms of nausea. However, yesterday symptoms worsened to the point where she was off balance with walking and was unable to keep anything that she ate down. She vomited several times and emesis was reported to be nonbloody in appearance. Associated symptoms included blurry vision in her left eye and some slurred speech. Patient still feels like her balance is off, but all other symptoms have otherwise resolved.  MRI revealed acute lacunar infarct of the left paracentral pons.  Neurology consulted.  Discharge Diagnoses:  Principal Problem:   Acute CVA (cerebrovascular accident) ([place]) Active Problems:   Essential hypertension   Prediabetes   OSA (obstructive sleep apnea)   Transaminitis   Nausea and vomiting   Leukocytosis   Acute lacunar CVA -CT head: no acute intracranial pathology -CTA head and neck: without large vessel occlusion -MRI brain: positive for acute lacunar infarct in left paracentral pons -Echocardiogram as below -Neurology consulted -PT OT SLP evaluation pending -Has reported intolerance to aspirin.  Started on Plavix/aspirin EC for 3 weeks then aspirin alone  -LDL 179. Start Lipitor -Ha1c pending   Essential  hypertension -Allow permissive hypertension.  BP 142/90 this morning -Patient is on lisinopril-HCTZ at home  Fatty liver -Abd [place] in 2019 showed hepatic steatosis -Has had chronically elevated LFT since 2017. Will need to watch closely on lipitor   Prediabetes -Hemoglobin A1c pending  OSA -CPAP nightly   Discharge Instructions  Discharge Instructions    Ambulatory referral to Neurology   Complete by: As directed    Follow up in stroke clinic at [facility] with Frann Rider, NP in about 4 weeks. If not available, consider Dr. Antony Contras, Dr. Bess Harvest, or Dr. Sarina Ill.   Ambulatory referral to Occupational Therapy   Complete by: As directed    Ambulatory referral to Physical Therapy   Complete by: As directed    Call MD for:  difficulty breathing, headache or visual disturbances   Complete by: As directed    Call MD for:  extreme fatigue   Complete by: As directed    Call MD for:  persistant dizziness or light-headedness   Complete by: As directed    Call MD for:  persistant nausea and vomiting   Complete by: As directed    Call MD for:  severe uncontrolled pain   Complete by: As directed    Call MD for:  temperature >100.4   Complete by: As directed    Diet - low sodium heart healthy   Complete by: As directed    Discharge instructions   Complete by: As directed    You were cared for by a hospitalist during your hospital stay. If you have any questions about your discharge medications or the care you received while you were in the hospital after you are discharged, you can call  the unit and ask to speak with the hospitalist on call if the hospitalist that took care of you is not available. Once you are discharged, your primary care physician will handle any further medical issues. Please note that NO REFILLS for any discharge medications will be authorized once you are discharged, as it is imperative that you return to your primary care  physician (or establish a relationship with a primary care physician if you do not have one) for your aftercare needs so that they can reassess your need for medications and monitor your lab values.   Increase activity slowly   Complete by: As directed      Allergies as of 05/24/2019      Reactions   Aspirin Nausea And Vomiting   Augmentin [amoxicillin-pot Clavulanate] Swelling   Hand swelled at IV site      Medication List    STOP taking these medications   cyclobenzaprine 10 MG tablet Commonly known as: FLEXERIL   lisinopril-hydrochlorothiazide 10-12.5 MG tablet Commonly known as: ZESTORETIC   naproxen 500 MG tablet Commonly known as: NAPROSYN     TAKE these medications   acetaminophen 650 MG CR tablet Commonly known as: TYLENOL Take 1,300 mg by mouth daily as needed (arthritis pain).   APPLE CIDER VINEGAR PO Take 2 capsules by mouth at bedtime.   aspirin EC 81 MG tablet Take 1 tablet (81 mg total) by mouth daily.   atorvastatin 80 MG tablet Commonly known as: LIPITOR Take 1 tablet (80 mg total) by mouth daily at 6 PM.   clopidogrel 75 MG tablet Commonly known as: PLAVIX Take 1 tablet (75 mg total) by mouth daily. Start taking on: May 25, 2019   Fish Oil 1000 MG Caps Take 1,000 mg by mouth at bedtime.   gabapentin 300 MG capsule Commonly known as: NEURONTIN TAKE 2 CAPSULES BY MOUTH 3 (THREE) TIMES DAILY. What changed: See the new instructions.   glucosamine-chondroitin 500-400 MG tablet Take 1 tablet by mouth 3 (three) times daily. What changed:   how much to take  when to take this   hydrochlorothiazide 12.5 MG tablet Commonly known as: HYDRODIURIL Take 12.5 mg by mouth daily.   lisinopril 10 MG tablet Commonly known as: ZESTRIL Take 10 mg by mouth daily.   multivitamin with minerals Tabs tablet Take 1 tablet by mouth daily.   PRESCRIPTION MEDICATION Inhale into the lungs at bedtime. CPAP   VITAMIN B-12 PO Take 1 tablet by mouth daily.       Follow-up Information    [facility] Follow up in 4 week(s).   Specialty: Neurology Why: stroke clinic. office will call with appt date and time Contact information: [address] [place] [phone]       Gildardo Pounds, NP. Schedule an appointment as soon as possible for a visit in 1 week(s).   Specialty: Nurse Practitioner Contact information: [address] [place] [address] [phone]          Allergies  Allergen Reactions  . Aspirin Nausea And Vomiting  . Augmentin [Amoxicillin-Pot Clavulanate] Swelling    Hand swelled at IV site    Consultations:  Neurology    Procedures/Studies: CT ANGIO HEAD W OR WO CONTRAST  Result Date: 05/23/2019 CLINICAL DATA:  Stroke follow-up. Acute pontine infarct on MRI. EXAM: CT ANGIOGRAPHY HEAD AND NECK TECHNIQUE: Multidetector CT imaging of the head and neck was performed using the standard protocol during bolus administration of intravenous contrast.  Multiplanar CT image reconstructions and MIPs were obtained to evaluate the vascular anatomy. Carotid stenosis measurements (when applicable) are obtained utilizing NASCET criteria, using the distal internal carotid diameter as the denominator. CONTRAST:  49mL OMNIPAQUE IOHEXOL 350 MG/ML SOLN COMPARISON:  08/10/2018 chest CT report FINDINGS: CTA NECK FINDINGS Aortic arch: Standard 3 vessel aortic arch with widely patent arch vessel origins. Right carotid system: Patent without evidence of stenosis or dissection. Left carotid system: Patent with eccentric calcified plaque in the carotid bulb. No evidence of stenosis or dissection. Vertebral arteries: Patent and codominant without evidence of stenosis or dissection. Skeleton: Moderate cervical spondylosis. Other neck: No evidence of cervical lymphadenopathy or mass. Upper chest: Mildly enlarged right axillary, mediastinal, and right hilar lymph nodes, similar in size to those  reported on a 08/10/2018 chest CT. Clear lung apices. Review of the MIP images confirms the above findings CTA HEAD FINDINGS Anterior circulation: The internal carotid arteries are patent from skull base to carotid termini with mild atherosclerosis but no evidence of significant stenosis. ACAs and MCAs are patent with mild branch vessel irregularity but no evidence of proximal branch occlusion or significant proximal stenosis. No aneurysm is identified. Posterior circulation: The intracranial vertebral arteries are patent to the basilar with calcified plaque on the left not resulting in significant stenosis. There is a 2 mm medially directed outpouching from the proximal right V4 segment suggesting a small aneurysm (series 7, image 142). Patent left PICA, right AICA, and bilateral SCA origins are identified. The basilar artery is widely patent. There are large left and diminutive or absent right posterior communicating arteries with a hypoplastic left P1 segment. The PCAs are patent with mild branch vessel irregularity but no flow limiting proximal stenosis. Venous sinuses: Patent. Anatomic variants: Fetal left PCA. Review of the MIP images confirms the above findings IMPRESSION: 1. Mild atherosclerosis in the head and neck without large vessel occlusion or significant proximal stenosis. 2. 2 mm right V4 segment aneurysm. Electronically Signed   By: Logan Bores M.D.   On: 05/23/2019 13:58   DG Chest 2 View  Result Date: 05/23/2019 CLINICAL DATA:  Cerebral infarct.  Dizziness, nausea and emesis. EXAM: CHEST - 2 VIEW COMPARISON:  None. FINDINGS: Trachea is midline. Heart size is accentuated by AP technique and slightly low lung volumes. Lungs are clear. No pleural fluid. IMPRESSION: No acute findings. Electronically Signed   By: Lorin Picket M.D.   On: 05/23/2019 13:10   CT HEAD WO CONTRAST  Result Date: 05/22/2019 CLINICAL DATA:  Dizziness, nausea, possible stroke EXAM: CT HEAD WITHOUT CONTRAST TECHNIQUE:  Contiguous axial images were obtained from the base of the skull through the vertex without intravenous contrast. COMPARISON:  None. FINDINGS: Brain: No evidence of acute infarction, hemorrhage, hydrocephalus, extra-axial collection or mass lesion/mass effect. Vascular: No hyperdense vessel or unexpected calcification. Skull: Normal. Negative for fracture or focal lesion. Sinuses/Orbits: Partial fluid opacification of the right mastoid air cells, or alternately a small incidental venous lake of the mastoid (series 4, image 20). Other: None. IMPRESSION: 1.  No acute intracranial pathology. 2. Partial fluid opacification of the right mastoid air cells, or alternately a small incidental venous lake of the mastoid (series 4, image 20). Correlate for signs and symptoms of mastoiditis. Electronically Signed   By: Eddie Candle M.D.   On: 05/22/2019 20:04   CT ANGIO NECK W OR WO CONTRAST  Result Date: 05/23/2019 CLINICAL DATA:  Stroke follow-up. Acute pontine infarct on MRI. EXAM: CT ANGIOGRAPHY HEAD AND NECK  TECHNIQUE: Multidetector CT imaging of the head and neck was performed using the standard protocol during bolus administration of intravenous contrast. Multiplanar CT image reconstructions and MIPs were obtained to evaluate the vascular anatomy. Carotid stenosis measurements (when applicable) are obtained utilizing NASCET criteria, using the distal internal carotid diameter as the denominator. CONTRAST:  72mL OMNIPAQUE IOHEXOL 350 MG/ML SOLN COMPARISON:  08/10/2018 chest CT report FINDINGS: CTA NECK FINDINGS Aortic arch: Standard 3 vessel aortic arch with widely patent arch vessel origins. Right carotid system: Patent without evidence of stenosis or dissection. Left carotid system: Patent with eccentric calcified plaque in the carotid bulb. No evidence of stenosis or dissection. Vertebral arteries: Patent and codominant without evidence of stenosis or dissection. Skeleton: Moderate cervical spondylosis. Other neck:  No evidence of cervical lymphadenopathy or mass. Upper chest: Mildly enlarged right axillary, mediastinal, and right hilar lymph nodes, similar in size to those reported on a 08/10/2018 chest CT. Clear lung apices. Review of the MIP images confirms the above findings CTA HEAD FINDINGS Anterior circulation: The internal carotid arteries are patent from skull base to carotid termini with mild atherosclerosis but no evidence of significant stenosis. ACAs and MCAs are patent with mild branch vessel irregularity but no evidence of proximal branch occlusion or significant proximal stenosis. No aneurysm is identified. Posterior circulation: The intracranial vertebral arteries are patent to the basilar with calcified plaque on the left not resulting in significant stenosis. There is a 2 mm medially directed outpouching from the proximal right V4 segment suggesting a small aneurysm (series 7, image 142). Patent left PICA, right AICA, and bilateral SCA origins are identified. The basilar artery is widely patent. There are large left and diminutive or absent right posterior communicating arteries with a hypoplastic left P1 segment. The PCAs are patent with mild branch vessel irregularity but no flow limiting proximal stenosis. Venous sinuses: Patent. Anatomic variants: Fetal left PCA. Review of the MIP images confirms the above findings IMPRESSION: 1. Mild atherosclerosis in the head and neck without large vessel occlusion or significant proximal stenosis. 2. 2 mm right V4 segment aneurysm. Electronically Signed   By: Logan Bores M.D.   On: 05/23/2019 13:58   MR BRAIN WO CONTRAST  Result Date: 05/23/2019 CLINICAL DATA:  59 year old female with dizziness since yesterday with nausea. Vomiting began today. EXAM: MRI HEAD WITHOUT CONTRAST TECHNIQUE: Multiplanar, multiecho pulse sequences of the brain and surrounding structures were obtained without intravenous contrast. COMPARISON:  Head CT 05/22/2019. FINDINGS: Brain: There  is a patchy linear array of restricted diffusion in the brainstem affecting the left paracentral pons (series 5, image 68 and series 6, image 16). Mild associated T2 and FLAIR hyperintensity. No associated hemorrhage or mass effect. No other restricted diffusion identified. Normal cerebral volume. No midline shift, mass effect, evidence of mass lesion, ventriculomegaly, extra-axial collection or acute intracranial hemorrhage. Cervicomedullary junction and pituitary are within normal limits. Outside of the pons gray and white matter signal is normal for age; minimal nonspecific cerebral white matter T2 and FLAIR hyperintensity. No chronic cerebral blood products identified. Vascular: Major intracranial vascular flow voids are preserved. Skull and upper cervical spine: Partially visible cervical spine disc and endplate degeneration. Mild if any upper cervical degenerative spinal stenosis. Visualized bone marrow signal is within normal limits. Absent dentition. Sinuses/Orbits: Mildly Disconjugate gaze, otherwise negative orbits. Bubbly opacity again noted in the right sphenoid sinus but the remaining paranasal sinuses are clear. Other: Posterior right mastoid air cell fluid again noted. The right tympanic cavity and remaining  mastoids remain clear. Negative nasopharynx. Otherwise negative visible internal auditory structures. Scalp and face soft tissues appear negative. IMPRESSION: 1. Positive for acute lacunar type infarct in the left paracentral pons (pontine perforator branch). No associated hemorrhage or mass effect. 2. Otherwise negative for age noncontrast MRI appearance of the brain. 3. Mild inflammation in the right sphenoid sinus. Mild fluid in the posterior right mastoid air cells, probably postinflammatory. Electronically Signed   By: Genevie Ann M.D.   On: 05/23/2019 09:26   ECHOCARDIOGRAM COMPLETE  Result Date: 05/23/2019   ECHOCARDIOGRAM REPORT   Patient Name:   Rhonda [name] [facility] Date of Exam: 05/23/2019  Medical Rec #:  [id number]      Height:       63.0 in Accession #:    [id number]     Weight:       214.0 lb Date of Birth:  Mar 16, 1961      BSA:          1.99 m Patient Age:    78 years       BP:           145/80 mmHg Patient Gender: F              HR:           77 bpm. Exam Location:  Inpatient Procedure: 2D Echo, Color Doppler and Cardiac Doppler Indications:    Stroke  History:        Patient has no prior history of Echocardiogram examinations.                 Risk Factors:Hypertension and Sleep Apnea.  Sonographer:    Raquel Sarna Senior RDCS Referring Phys: [phone] [place]  1. Left ventricular ejection fraction, by visual estimation, is 60 to 65%. The left ventricle has normal function. There is no left ventricular hypertrophy.  2. The left ventricle has no regional wall motion abnormalities.  3. Global right ventricle has normal systolic function.The right ventricular size is normal. No increase in right ventricular wall thickness.  4. Left atrial size was normal.  5. Right atrial size was normal.  6. The mitral valve is normal in structure. Trivial mitral valve regurgitation.  7. The tricuspid valve is normal in structure.  8. The aortic valve is normal in structure. Aortic valve regurgitation is not visualized.  9. The pulmonic valve was normal in structure. Pulmonic valve regurgitation is not visualized. 10. The atrial septum is grossly normal. FINDINGS  Left Ventricle: Left ventricular ejection fraction, by visual estimation, is 60 to 65%. The left ventricle has normal function. The left ventricle has no regional wall motion abnormalities. There is no left ventricular hypertrophy. Left ventricular diastolic parameters were normal. Right Ventricle: The right ventricular size is normal. No increase in right ventricular wall thickness. Global RV systolic function is has normal systolic function. Left Atrium: Left atrial size was normal in size. Right Atrium: Right atrial size was normal in size  Pericardium: There is no evidence of pericardial effusion. Mitral Valve: The mitral valve is normal in structure. Trivial mitral valve regurgitation. Tricuspid Valve: The tricuspid valve is normal in structure. Tricuspid valve regurgitation is not demonstrated. Aortic Valve: The aortic valve is normal in structure. Aortic valve regurgitation is not visualized. Pulmonic Valve: The pulmonic valve was normal in structure. Pulmonic valve regurgitation is not visualized. Pulmonic regurgitation is not visualized. Aorta: The aortic root and ascending aorta are structurally normal, with no evidence of dilitation. IAS/Shunts: The atrial septum  is grossly normal.  LEFT VENTRICLE PLAX 2D LVIDd:         3.70 cm  Diastology LVIDs:         2.30 cm  LV e' lateral:   9.14 cm/s LV PW:         1.30 cm  LV E/e' lateral: 10.1 LV IVS:        1.00 cm  LV e' medial:    6.74 cm/s LVOT diam:     1.80 cm  LV E/e' medial:  13.7 LV SV:         40 ml LV SV Index:   18.83 LVOT Area:     2.54 cm  RIGHT VENTRICLE RV S prime:     11.10 cm/s TAPSE (M-mode): 2.2 cm LEFT ATRIUM             Index       RIGHT ATRIUM           Index LA diam:        3.10 cm 1.56 cm/m  RA Area:     13.10 cm LA Vol (A2C):   49.4 ml 24.82 ml/m RA Volume:   30.40 ml  15.28 ml/m LA Vol (A4C):   58.2 ml 29.25 ml/m LA Biplane Vol: 55.9 ml 28.09 ml/m  AORTIC VALVE LVOT Vmax:   119.00 cm/s LVOT Vmean:  84.800 cm/s LVOT VTI:    0.257 m  AORTA Ao Root diam: 2.60 cm Ao Asc diam:  3.00 cm MITRAL VALVE MV Area (PHT): 3.03 cm             SHUNTS MV PHT:        72.50 msec           Systemic VTI:  0.26 m MV Decel Time: 250 msec             Systemic Diam: 1.80 cm MV E velocity: 92.10 cm/s 103 cm/s MV A velocity: 77.10 cm/s 70.3 cm/s MV E/A ratio:  1.19       1.5  Mertie Moores MD Electronically signed by Mertie Moores MD Signature Date/Time: 05/23/2019/4:29:07 PM    Final       Discharge Exam: Vitals:   05/24/19 0604 05/24/19 1202  BP: (!) 142/90 134/79  Pulse: 87 88  Resp:  16 18  Temp: 98.7 F (37.1 C)   SpO2: 98% 98%    General: Pt is alert, awake, not in acute distress Cardiovascular: RRR, S1/S2 +, no edema Respiratory: CTA bilaterally, no wheezing, no rhonchi, no respiratory distress, no conversational dyspnea  Abdominal: Soft, NT, ND, bowel sounds + Extremities: no edema, no cyanosis Psych: Normal mood and affect, stable judgement and insight     The results of significant diagnostics from this hospitalization (including imaging, microbiology, ancillary and laboratory) are listed below for reference.     Microbiology: Recent Results (from the past 240 hour(s))  Respiratory Panel by RT PCR (Flu A&B, Covid) - Nasopharyngeal Swab     Status: None   Collection Time: 05/23/19 12:08 PM   Specimen: Nasopharyngeal Swab  Result Value Ref Range Status   SARS Coronavirus 2 by RT PCR NEGATIVE NEGATIVE Final    Comment: (NOTE) SARS-CoV-2 target nucleic acids are NOT DETECTED. The SARS-CoV-2 RNA is generally detectable in upper respiratoy specimens during the acute phase of infection. The lowest concentration of SARS-CoV-2 viral copies this assay can detect is 131 copies/mL. A negative result does not preclude SARS-Cov-2 infection and should not be used as the  sole basis for treatment or other patient management decisions. A negative result may occur with  improper specimen collection/handling, submission of specimen other than nasopharyngeal swab, presence of viral mutation(s) within the areas targeted by this assay, and inadequate number of viral copies (<131 copies/mL). A negative result must be combined with clinical observations, patient history, and epidemiological information. The expected result is Negative. Fact Sheet for Patients:  PinkCheek.be Fact Sheet for Healthcare Providers:  GravelBags.it This test is not yet ap proved or cleared by the [place] FDA and  has been authorized  for detection and/or diagnosis of SARS-CoV-2 by FDA under an Emergency Use Authorization (EUA). This EUA will remain  in effect (meaning this test can be used) for the duration of the COVID-19 declaration under Section 564(b)(1) of the Act, 21 U.S.C. section 360bbb-3(b)(1), unless the authorization is terminated or revoked sooner.    Influenza A by PCR NEGATIVE NEGATIVE Final   Influenza B by PCR NEGATIVE NEGATIVE Final    Comment: (NOTE) The Xpert Xpress SARS-CoV-2/FLU/RSV assay is intended as an aid in  the diagnosis of influenza from Nasopharyngeal swab specimens and  should not be used as a sole basis for treatment. Nasal washings and  aspirates are unacceptable for Xpert Xpress SARS-CoV-2/FLU/RSV  testing. Fact Sheet for Patients: PinkCheek.be Fact Sheet for Healthcare Providers: GravelBags.it This test is not yet approved or cleared by the [place] FDA and  has been authorized for detection and/or diagnosis of SARS-CoV-2 by  FDA under an Emergency Use Authorization (EUA). This EUA will remain  in effect (meaning this test can be used) for the duration of the  Covid-19 declaration under Section 564(b)(1) of the Act, 21  U.S.C. section 360bbb-3(b)(1), unless the authorization is  terminated or revoked. Performed at [facility], [place] [address], [place],  [address]      Labs: BNP (last 3 results) No results for input(s): BNP in the last 8760 hours. Basic Metabolic Panel: Recent Labs  Lab 05/22/19 1811 05/22/19 1825 05/23/19 1627 05/24/19 0556  NA 130* 136 137 137  K 3.9 3.9 3.6 3.9  CL 94* 97* 99 101  CO2 25  --  27 26  GLUCOSE 125* 123* 83 91  BUN 9 9 12 11   CREATININE 0.64 0.60 0.74 0.73  CALCIUM 9.6  --  9.5 9.2   Liver Function Tests: Recent Labs  Lab 05/22/19 1811 05/23/19 1627 05/24/19 0556  AST 96* 75* 62*  ALT 65* 56* 50*  ALKPHOS 55 49 47  BILITOT 0.5 0.7 0.4  PROT 8.2*  7.2 7.1  ALBUMIN 3.6 3.1* 3.0*   No results for input(s): LIPASE, AMYLASE in the last 168 hours. No results for input(s): AMMONIA in the last 168 hours. CBC: Recent Labs  Lab 05/22/19 1811 05/22/19 1825 05/23/19 1627  WBC 11.3*  --  10.2  NEUTROABS 9.5*  --   --   HGB 12.3 13.3 12.6  HCT 40.1 39.0 41.2  MCV 82.2  --  82.1  PLT 403*  --  386   Cardiac Enzymes: No results for input(s): CKTOTAL, CKMB, CKMBINDEX, TROPONINI in the last 168 hours. BNP: Invalid input(s): POCBNP CBG: Recent Labs  Lab 05/22/19 1748 05/23/19 0759  GLUCAP 120* 77   D-Dimer No results for input(s): DDIMER in the last 72 hours. Hgb A1c No results for input(s): HGBA1C in the last 72 hours. Lipid Profile Recent Labs    05/24/19 0556  CHOL 248*  HDL 35*  LDLCALC 179*  TRIG 172*  CHOLHDL 7.1   Thyroid function studies No results for input(s): TSH, T4TOTAL, T3FREE, THYROIDAB in the last 72 hours.  Invalid input(s): FREET3 Anemia work up No results for input(s): VITAMINB12, FOLATE, FERRITIN, TIBC, IRON, RETICCTPCT in the last 72 hours. Urinalysis    Component Value Date/Time   APPEARANCEUR Clear 02/10/2018 1639   GLUCOSEU Negative 02/10/2018 1639   BILIRUBINUR Negative 02/10/2018 1639   PROTEINUR Negative 02/10/2018 1639   NITRITE Negative 02/10/2018 1639   LEUKOCYTESUR Negative 02/10/2018 1639   Sepsis Labs Invalid input(s): PROCALCITONIN,  WBC,  LACTICIDVEN Microbiology Recent Results (from the past 240 hour(s))  Respiratory Panel by RT PCR (Flu A&B, Covid) - Nasopharyngeal Swab     Status: None   Collection Time: 05/23/19 12:08 PM   Specimen: Nasopharyngeal Swab  Result Value Ref Range Status   SARS Coronavirus 2 by RT PCR NEGATIVE NEGATIVE Final    Comment: (NOTE) SARS-CoV-2 target nucleic acids are NOT DETECTED. The SARS-CoV-2 RNA is generally detectable in upper respiratoy specimens during the acute phase of infection. The lowest concentration of SARS-CoV-2 viral copies this  assay can detect is 131 copies/mL. A negative result does not preclude SARS-Cov-2 infection and should not be used as the sole basis for treatment or other patient management decisions. A negative result may occur with  improper specimen collection/handling, submission of specimen other than nasopharyngeal swab, presence of viral mutation(s) within the areas targeted by this assay, and inadequate number of viral copies (<131 copies/mL). A negative result must be combined with clinical observations, patient history, and epidemiological information. The expected result is Negative. Fact Sheet for Patients:  PinkCheek.be Fact Sheet for Healthcare Providers:  GravelBags.it This test is not yet ap proved or cleared by the [place] FDA and  has been authorized for detection and/or diagnosis of SARS-CoV-2 by FDA under an Emergency Use Authorization (EUA). This EUA will remain  in effect (meaning this test can be used) for the duration of the COVID-19 declaration under Section 564(b)(1) of the Act, 21 U.S.C. section 360bbb-3(b)(1), unless the authorization is terminated or revoked sooner.    Influenza A by PCR NEGATIVE NEGATIVE Final   Influenza B by PCR NEGATIVE NEGATIVE Final    Comment: (NOTE) The Xpert Xpress SARS-CoV-2/FLU/RSV assay is intended as an aid in  the diagnosis of influenza from Nasopharyngeal swab specimens and  should not be used as a sole basis for treatment. Nasal washings and  aspirates are unacceptable for Xpert Xpress SARS-CoV-2/FLU/RSV  testing. Fact Sheet for Patients: PinkCheek.be Fact Sheet for Healthcare Providers: GravelBags.it This test is not yet approved or cleared by the [place] FDA and  has been authorized for detection and/or diagnosis of SARS-CoV-2 by  FDA under an Emergency Use Authorization (EUA). This EUA will remain  in  effect (meaning this test can be used) for the duration of the  Covid-19 declaration under Section 564(b)(1) of the Act, 21  U.S.C. section 360bbb-3(b)(1), unless the authorization is  terminated or revoked. Performed at [facility], [address] [place], [place] [address]      Patient was seen and examined on the day of discharge and was found to be in stable condition. Time coordinating discharge: 35 minutes including assessment and coordination of care, as well as examination of the patient.   SIGNED:  Dessa Phi, DO [facility] 05/24/2019, 4:11 PM

## 2019-05-24 NOTE — Progress Notes (Signed)
Received to room 5C12 @0035  from ER via stretcher. Assisted to bathroom to void then into bed and positioned for comfort. Oriented to room, bed and unit.

## 2019-05-24 NOTE — Progress Notes (Signed)
RN gave pt discharge instructions and pt stated understanding, IV has been removed pt husband at bedside stated that he would help the pt get dressed. I reached out to [facility] ED CM to see if we can get the patient a 3in1 commode and a RW. Awaiting call back

## 2019-05-24 NOTE — Progress Notes (Signed)
STROKE TEAM PROGRESS NOTE   INTERVAL HISTORY I have personally reviewed history of presenting illness, electronic medical records and imaging films in PACS.  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  CT angiogram showed no significant large vessel extracranial intracranial stenosis.  2D echo shows normal ejection fraction.  LDL cholesterol is elevated at 179 mg percent.  Vitals:   05/24/19 0000 05/24/19 0048 05/24/19 0604 05/24/19 1202  BP: (!) 123/96 (!) 152/95 (!) 142/90 134/79  Pulse: 90 94 87 88  Resp: 14 16 16 18   Temp:  98.1 F (36.7 C) 98.7 F (37.1 C)   TempSrc:  Oral Oral Oral  SpO2: 100% 100% 98% 98%    CBC:  Recent Labs  Lab 05/22/19 1811 05/22/19 1825 05/23/19 1627  WBC 11.3*  --  10.2  NEUTROABS 9.5*  --   --   HGB 12.3 13.3 12.6  HCT 40.1 39.0 41.2  MCV 82.2  --  82.1  PLT 403*  --  [id number]    Basic Metabolic Panel:  Recent Labs  Lab 05/23/19 1627 05/24/19 0556  NA 137 137  K 3.6 3.9  CL 99 101  CO2 27 26  GLUCOSE 83 91  BUN 12 11  CREATININE 0.74 0.73  CALCIUM 9.5 9.2   Lipid Panel:     Component Value Date/Time   CHOL 248 (H) 05/24/2019 0556   CHOL 213 (H) 07/07/2017 0932   TRIG 172 (H) 05/24/2019 0556   HDL 35 (L) 05/24/2019 0556   HDL 40 07/07/2017 0932   CHOLHDL 7.1 05/24/2019 0556   VLDL 34 05/24/2019 0556   LDLCALC 179 (H) 05/24/2019 0556   LDLCALC 137 (H) 07/07/2017 0932   HgbA1c:  Lab Results  Component Value Date   HGBA1C 6.1 (A) 10/05/2017   Urine Drug Screen: No results found for: LABOPIA, COCAINSCRNUR, LABBENZ, AMPHETMU, THCU, LABBARB  Alcohol Level No results found for: ETH  IMAGING past 48 hours CT ANGIO HEAD W OR WO CONTRAST  Result Date: 05/23/2019 CLINICAL DATA:  Stroke follow-up. Acute pontine infarct on MRI. EXAM: CT ANGIOGRAPHY HEAD AND NECK TECHNIQUE: Multidetector CT imaging of the head and neck was performed using the  standard protocol during bolus administration of intravenous contrast. Multiplanar CT image reconstructions and MIPs were obtained to evaluate the vascular anatomy. Carotid stenosis measurements (when applicable) are obtained utilizing NASCET criteria, using the distal internal carotid diameter as the denominator. CONTRAST:  60mL OMNIPAQUE IOHEXOL 350 MG/ML SOLN COMPARISON:  08/10/2018 chest CT report FINDINGS: CTA NECK FINDINGS Aortic arch: Standard 3 vessel aortic arch with widely patent arch vessel origins. Right carotid system: Patent without evidence of stenosis or dissection. Left carotid system: Patent with eccentric calcified plaque in the carotid bulb. No evidence of stenosis or dissection. Vertebral arteries: Patent and codominant without evidence of stenosis or dissection. Skeleton: Moderate cervical spondylosis. Other neck: No evidence of cervical lymphadenopathy or mass. Upper chest: Mildly enlarged right axillary, mediastinal, and right hilar lymph nodes, similar in size to those reported on a 08/10/2018 chest CT. Clear lung apices. Review of the MIP images confirms the above findings CTA HEAD FINDINGS Anterior circulation: The internal carotid arteries are patent from skull base to carotid termini with mild atherosclerosis but no evidence of significant stenosis. ACAs and MCAs are patent with mild branch vessel irregularity but no evidence of proximal branch occlusion or significant proximal stenosis. No aneurysm is identified. Posterior circulation: The  intracranial vertebral arteries are patent to the basilar with calcified plaque on the left not resulting in significant stenosis. There is a 2 mm medially directed outpouching from the proximal right V4 segment suggesting a small aneurysm (series 7, image 142). Patent left PICA, right AICA, and bilateral SCA origins are identified. The basilar artery is widely patent. There are large left and diminutive or absent right posterior communicating  arteries with a hypoplastic left P1 segment. The PCAs are patent with mild branch vessel irregularity but no flow limiting proximal stenosis. Venous sinuses: Patent. Anatomic variants: Fetal left PCA. Review of the MIP images confirms the above findings IMPRESSION: 1. Mild atherosclerosis in the head and neck without large vessel occlusion or significant proximal stenosis. 2. 2 mm right V4 segment aneurysm. Electronically Signed   By: Logan Bores M.D.   On: 05/23/2019 13:58   DG Chest 2 View  Result Date: 05/23/2019 CLINICAL DATA:  Cerebral infarct.  Dizziness, nausea and emesis. EXAM: CHEST - 2 VIEW COMPARISON:  None. FINDINGS: Trachea is midline. Heart size is accentuated by AP technique and slightly low lung volumes. Lungs are clear. No pleural fluid. IMPRESSION: No acute findings. Electronically Signed   By: Lorin Picket M.D.   On: 05/23/2019 13:10   CT HEAD WO CONTRAST  Result Date: 05/22/2019 CLINICAL DATA:  Dizziness, nausea, possible stroke EXAM: CT HEAD WITHOUT CONTRAST TECHNIQUE: Contiguous axial images were obtained from the base of the skull through the vertex without intravenous contrast. COMPARISON:  None. FINDINGS: Brain: No evidence of acute infarction, hemorrhage, hydrocephalus, extra-axial collection or mass lesion/mass effect. Vascular: No hyperdense vessel or unexpected calcification. Skull: Normal. Negative for fracture or focal lesion. Sinuses/Orbits: Partial fluid opacification of the right mastoid air cells, or alternately a small incidental venous lake of the mastoid (series 4, image 20). Other: None. IMPRESSION: 1.  No acute intracranial pathology. 2. Partial fluid opacification of the right mastoid air cells, or alternately a small incidental venous lake of the mastoid (series 4, image 20). Correlate for signs and symptoms of mastoiditis. Electronically Signed   By: Eddie Candle M.D.   On: 05/22/2019 20:04   CT ANGIO NECK W OR WO CONTRAST  Result Date: 05/23/2019 CLINICAL  DATA:  Stroke follow-up. Acute pontine infarct on MRI. EXAM: CT ANGIOGRAPHY HEAD AND NECK TECHNIQUE: Multidetector CT imaging of the head and neck was performed using the standard protocol during bolus administration of intravenous contrast. Multiplanar CT image reconstructions and MIPs were obtained to evaluate the vascular anatomy. Carotid stenosis measurements (when applicable) are obtained utilizing NASCET criteria, using the distal internal carotid diameter as the denominator. CONTRAST:  75mL OMNIPAQUE IOHEXOL 350 MG/ML SOLN COMPARISON:  08/10/2018 chest CT report FINDINGS: CTA NECK FINDINGS Aortic arch: Standard 3 vessel aortic arch with widely patent arch vessel origins. Right carotid system: Patent without evidence of stenosis or dissection. Left carotid system: Patent with eccentric calcified plaque in the carotid bulb. No evidence of stenosis or dissection. Vertebral arteries: Patent and codominant without evidence of stenosis or dissection. Skeleton: Moderate cervical spondylosis. Other neck: No evidence of cervical lymphadenopathy or mass. Upper chest: Mildly enlarged right axillary, mediastinal, and right hilar lymph nodes, similar in size to those reported on a 08/10/2018 chest CT. Clear lung apices. Review of the MIP images confirms the above findings CTA HEAD FINDINGS Anterior circulation: The internal carotid arteries are patent from skull base to carotid termini with mild atherosclerosis but no evidence of significant stenosis. ACAs and MCAs are patent with mild  branch vessel irregularity but no evidence of proximal branch occlusion or significant proximal stenosis. No aneurysm is identified. Posterior circulation: The intracranial vertebral arteries are patent to the basilar with calcified plaque on the left not resulting in significant stenosis. There is a 2 mm medially directed outpouching from the proximal right V4 segment suggesting a small aneurysm (series 7, image 142). Patent left PICA,  right AICA, and bilateral SCA origins are identified. The basilar artery is widely patent. There are large left and diminutive or absent right posterior communicating arteries with a hypoplastic left P1 segment. The PCAs are patent with mild branch vessel irregularity but no flow limiting proximal stenosis. Venous sinuses: Patent. Anatomic variants: Fetal left PCA. Review of the MIP images confirms the above findings IMPRESSION: 1. Mild atherosclerosis in the head and neck without large vessel occlusion or significant proximal stenosis. 2. 2 mm right V4 segment aneurysm. Electronically Signed   By: Logan Bores M.D.   On: 05/23/2019 13:58   MR BRAIN WO CONTRAST  Result Date: 05/23/2019 CLINICAL DATA:  59 year old female with dizziness since yesterday with nausea. Vomiting began today. EXAM: MRI HEAD WITHOUT CONTRAST TECHNIQUE: Multiplanar, multiecho pulse sequences of the brain and surrounding structures were obtained without intravenous contrast. COMPARISON:  Head CT 05/22/2019. FINDINGS: Brain: There is a patchy linear array of restricted diffusion in the brainstem affecting the left paracentral pons (series 5, image 68 and series 6, image 16). Mild associated T2 and FLAIR hyperintensity. No associated hemorrhage or mass effect. No other restricted diffusion identified. Normal cerebral volume. No midline shift, mass effect, evidence of mass lesion, ventriculomegaly, extra-axial collection or acute intracranial hemorrhage. Cervicomedullary junction and pituitary are within normal limits. Outside of the pons gray and white matter signal is normal for age; minimal nonspecific cerebral white matter T2 and FLAIR hyperintensity. No chronic cerebral blood products identified. Vascular: Major intracranial vascular flow voids are preserved. Skull and upper cervical spine: Partially visible cervical spine disc and endplate degeneration. Mild if any upper cervical degenerative spinal stenosis. Visualized bone marrow  signal is within normal limits. Absent dentition. Sinuses/Orbits: Mildly Disconjugate gaze, otherwise negative orbits. Bubbly opacity again noted in the right sphenoid sinus but the remaining paranasal sinuses are clear. Other: Posterior right mastoid air cell fluid again noted. The right tympanic cavity and remaining mastoids remain clear. Negative nasopharynx. Otherwise negative visible internal auditory structures. Scalp and face soft tissues appear negative. IMPRESSION: 1. Positive for acute lacunar type infarct in the left paracentral pons (pontine perforator branch). No associated hemorrhage or mass effect. 2. Otherwise negative for age noncontrast MRI appearance of the brain. 3. Mild inflammation in the right sphenoid sinus. Mild fluid in the posterior right mastoid air cells, probably postinflammatory. Electronically Signed   By: Genevie Ann M.D.   On: 05/23/2019 09:26   ECHOCARDIOGRAM COMPLETE  Result Date: 05/23/2019   ECHOCARDIOGRAM REPORT   Patient Name:   SUAH HASKEW [facility] Date of Exam: 05/23/2019 Medical Rec #:  [id number]      Height:       63.0 in Accession #:    [id number]     Weight:       214.0 lb Date of Birth:  [birth year]      BSA:          1.99 m Patient Age:    51 years       BP:           145/80 mmHg Patient Gender: F  HR:           77 bpm. Exam Location:  Inpatient Procedure: 2D Echo, Color Doppler and Cardiac Doppler Indications:    Stroke  History:        Patient has no prior history of Echocardiogram examinations.                 Risk Factors:Hypertension and Sleep Apnea.  Sonographer:    Raquel Sarna Senior RDCS Referring Phys: [phone] Teresita  1. Left ventricular ejection fraction, by visual estimation, is 60 to 65%. The left ventricle has normal function. There is no left ventricular hypertrophy.  2. The left ventricle has no regional wall motion abnormalities.  3. Global right ventricle has normal systolic function.The right ventricular size is normal. No increase in  right ventricular wall thickness.  4. Left atrial size was normal.  5. Right atrial size was normal.  6. The mitral valve is normal in structure. Trivial mitral valve regurgitation.  7. The tricuspid valve is normal in structure.  8. The aortic valve is normal in structure. Aortic valve regurgitation is not visualized.  9. The pulmonic valve was normal in structure. Pulmonic valve regurgitation is not visualized. 10. The atrial septum is grossly normal. FINDINGS  Left Ventricle: Left ventricular ejection fraction, by visual estimation, is 60 to 65%. The left ventricle has normal function. The left ventricle has no regional wall motion abnormalities. There is no left ventricular hypertrophy. Left ventricular diastolic parameters were normal. Right Ventricle: The right ventricular size is normal. No increase in right ventricular wall thickness. Global RV systolic function is has normal systolic function. Left Atrium: Left atrial size was normal in size. Right Atrium: Right atrial size was normal in size Pericardium: There is no evidence of pericardial effusion. Mitral Valve: The mitral valve is normal in structure. Trivial mitral valve regurgitation. Tricuspid Valve: The tricuspid valve is normal in structure. Tricuspid valve regurgitation is not demonstrated. Aortic Valve: The aortic valve is normal in structure. Aortic valve regurgitation is not visualized. Pulmonic Valve: The pulmonic valve was normal in structure. Pulmonic valve regurgitation is not visualized. Pulmonic regurgitation is not visualized. Aorta: The aortic root and ascending aorta are structurally normal, with no evidence of dilitation. IAS/Shunts: The atrial septum is grossly normal.  LEFT VENTRICLE PLAX 2D LVIDd:         3.70 cm  Diastology LVIDs:         2.30 cm  LV e' lateral:   9.14 cm/s LV PW:         1.30 cm  LV E/e' lateral: 10.1 LV IVS:        1.00 cm  LV e' medial:    6.74 cm/s LVOT diam:     1.80 cm  LV E/e' medial:  13.7 LV SV:         40  ml LV SV Index:   18.83 LVOT Area:     2.54 cm  RIGHT VENTRICLE RV S prime:     11.10 cm/s TAPSE (M-mode): 2.2 cm LEFT ATRIUM             Index       RIGHT ATRIUM           Index LA diam:        3.10 cm 1.56 cm/m  RA Area:     13.10 cm LA Vol (A2C):   49.4 ml 24.82 ml/m RA Volume:   30.40 ml  15.28 ml/m LA Vol (A4C):  58.2 ml 29.25 ml/m LA Biplane Vol: 55.9 ml 28.09 ml/m  AORTIC VALVE LVOT Vmax:   119.00 cm/s LVOT Vmean:  84.800 cm/s LVOT VTI:    0.257 m  AORTA Ao Root diam: 2.60 cm Ao Asc diam:  3.00 cm MITRAL VALVE MV Area (PHT): 3.03 cm             SHUNTS MV PHT:        72.50 msec           Systemic VTI:  0.26 m MV Decel Time: 250 msec             Systemic Diam: 1.80 cm MV E velocity: 92.10 cm/s 103 cm/s MV A velocity: 77.10 cm/s 70.3 cm/s MV E/A ratio:  1.19       1.5  Mertie Moores MD Electronically signed by Mertie Moores MD Signature Date/Time: 05/23/2019/4:29:07 PM    Final     PHYSICAL EXAM Pleasant middle-aged African-American lady currently not in distress. . Afebrile. Head is nontraumatic. Neck is supple without bruit.    Cardiac exam no murmur or gallop. Lungs are clear to auscultation. Distal pulses are well felt. Neurological Exam :  Awake  Alert oriented x 3. Normal speech and language.eye movements full without nystagmus.fundi were not visualized. Vision acuity and fields appear normal. Hearing is normal. Palatal movements are normal. Face symmetric. Tongue midline. Normal strength, tone, reflexes and coordination. Normal sensation. Gait deferred.  ASSESSMENT/PLAN Rhonda [name] is a 59 y.o. female with history of prediabetes, OSA, HTN and arthritis presenting with sudden onset nausea and vomiting which improved and blurry vision which resolved. MRI showed acute stroke.   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 Sinus dz  CTA head & neck mild atherosclerosis. 31mm R V4 aneurysm  2D Echo EF 60-65%. No source of  embolus   LDL 179  HgbA1c pending   Lovenox 40 mg sq daily for VTE prophylaxis  No antithrombotic prior to admission, now on aspirin 81 mg daily and clopidogrel 75 mg daily x 3 weeks then EC aspirin alone.   Therapy recommendations:  OP PT, OP OT, RW  Disposition:  Return home  Hypertension  Stable . Permissive hypertension (OK if < 220/120) but gradually normalize in 5-7 days . Long-term BP goal normotensive  Hyperlipidemia  Home meds:  No statin  Now on lipitor 80   LDL 179, goal < 70  Monitor LFTs at discharge  Continue statin at discharge  Pre-Diabetes   HgbA1c pending, goal < 7.0  CBGs Recent Labs    05/22/19 1748 05/23/19 0759  GLUCAP 120* 77      SSI  Other Stroke Risk Factors  Morbid Obesity, There is no height or weight on file to calculate BMI., recommend weight loss, diet and exercise as appropriate  obstructive sleep apnea   Other Active Problems  transaminintis w/ chronically elevated LFTs since 2017  Hospital day # 1  I have personally obtained history,examined this patient, reviewed notes, independently viewed imaging studies, participated in medical decision making and plan of care.ROS completed by me personally and pertinent positives fully documented  I have made any additions or clarifications directly to the above note.  She presented with nausea and dizziness secondary to small left pontine infarct from small vessel disease.  Recommend aspirin Plavix for 3 weeks followed by aspirin alone and aggressive risk factor modification.  Statin for elevated lipids.  Patient counseled to be compliant with using CPAP for sleep apnea.  Discussed with Dr. Maylene Roes.  Follow-up as an outpatient in the stroke clinic in 6 weeks.  Greater than 50% time during this 25-minute visit was spent in counseling and coordination of care and discussion with care team and answering questions.  Stroke team will sign off  Antony Contras, MD Medical Director [place] Pager: [phone] 05/24/2019 4:01 PM   To contact Stroke Continuity provider, please refer to [email or web address] After hours, contact General Neurology

## 2019-05-24 NOTE — Progress Notes (Addendum)
PROGRESS NOTE    LAKEIRA ALBELO  [id number] DOB: 06/09/1960 DOA: 05/22/2019 PCP: Gildardo Pounds, NP     Brief Narrative:  JULY CZEPIEL is a 59 y.o. female with medical history significant of hypertension, arthritis, and prediabetes.  Patient presents with complaints of dizziness that initially started 2 days ago.  Describes dizziness as a lightheaded feeling with associated symptoms of nausea.  However, yesterday symptoms worsened to the point where she was off balance with walking and was unable to keep anything that she ate down.  She vomited several times and emesis was reported to be nonbloody in appearance.  Associated symptoms included blurry vision in her left eye and some slurred speech.  Patient still feels like her balance is off, but all other symptoms have otherwise resolved.  MRI revealed acute lacunar infarct of the left paracentral pons.  Neurology consulted.  New events last 24 hours / Subjective: Feeling well, denies any dizziness this morning.  Has some blurry vision on the left visual field and still feels off balance on her feet.  Assessment & Plan:   Principal Problem:   CVA (cerebral vascular accident) ([place]) Active Problems:   Essential hypertension   Prediabetes   OSA (obstructive sleep apnea)   Transaminitis   Nausea and vomiting   Leukocytosis   Acute lacunar CVA -CT head: no acute intracranial pathology -CTA head and neck: without large vessel occlusion -MRI brain: positive for acute lacunar infarct in left paracentral pons -Echocardiogram as below -Neurology consulted -PT OT SLP evaluation pending -Has reported intolerance to aspirin.  Started on Plavix -LDL 179. Start Lipitor -Ha1c pending   Essential hypertension -Allow permissive hypertension.  BP 142/90 this morning -Patient is on lisinopril-HCTZ at home  Fatty liver -Abd [place] in 2019 showed hepatic steatosis -Has had chronically elevated LFT since 2017. Will need to watch closely on  lipitor   Prediabetes -Hemoglobin A1c pending  OSA -CPAP nightly   DVT prophylaxis: Lovenox Code Status: Full code Family Communication: No family at bedside Disposition Plan: Pending further neurology evaluation, PT evaluation pending   Consultants:   Neurology   Antimicrobials:  Anti-infectives (From admission, onward)   None        Objective: Vitals:   05/23/19 1900 05/24/19 0000 05/24/19 0048 05/24/19 0604  BP: (!) 141/85 (!) 123/96 (!) 152/95 (!) 142/90  Pulse: 90 90 94 87  Resp: (!) 38 14 16 16   Temp:   98.1 F (36.7 C) 98.7 F (37.1 C)  TempSrc:   Oral Oral  SpO2: 100% 100% 100% 98%    Intake/Output Summary (Last 24 hours) at 05/24/2019 1002 Last data filed at 05/23/2019 1641 Gross per 24 hour  Intake --  Output 250 ml  Net -250 ml   There were no vitals filed for this visit.  Examination:  General exam: Appears calm and comfortable  Respiratory system: Clear to auscultation. Respiratory effort normal. No respiratory distress. No conversational dyspnea.  Cardiovascular system: S1 & S2 heard, RRR. No murmurs. No pedal edema. Gastrointestinal system: Abdomen is nondistended, soft and nontender. Normal bowel sounds heard. Central nervous system: Alert and oriented. No focal neurological deficits. Speech clear.  Extremities: Symmetric in appearance  Skin: No rashes, lesions or ulcers on exposed skin  Psychiatry: Judgement and insight appear normal. Mood & affect appropriate.   Data Reviewed: I have personally reviewed following labs and imaging studies  CBC: Recent Labs  Lab 05/22/19 1811 05/22/19 1825 05/23/19 1627  WBC 11.3*  --  10.2  NEUTROABS 9.5*  --   --   HGB 12.3 13.3 12.6  HCT 40.1 39.0 41.2  MCV 82.2  --  82.1  PLT 403*  --  [id number]   Basic Metabolic Panel: Recent Labs  Lab 05/22/19 1811 05/22/19 1825 05/23/19 1627 05/24/19 0556  NA 130* 136 137 137  K 3.9 3.9 3.6 3.9  CL 94* 97* 99 101  CO2 25  --  27 26  GLUCOSE 125* 123*  83 91  BUN 9 9 12 11   CREATININE 0.64 0.60 0.74 0.73  CALCIUM 9.6  --  9.5 9.2   GFR: CrCl cannot be calculated (Unknown ideal weight.). Liver Function Tests: Recent Labs  Lab 05/22/19 1811 05/23/19 1627  AST 96* 75*  ALT 65* 56*  ALKPHOS 55 49  BILITOT 0.5 0.7  PROT 8.2* 7.2  ALBUMIN 3.6 3.1*   No results for input(s): LIPASE, AMYLASE in the last 168 hours. No results for input(s): AMMONIA in the last 168 hours. Coagulation Profile: Recent Labs  Lab 05/22/19 1811  INR 1.0   Cardiac Enzymes: No results for input(s): CKTOTAL, CKMB, CKMBINDEX, TROPONINI in the last 168 hours. BNP (last 3 results) No results for input(s): PROBNP in the last 8760 hours. HbA1C: No results for input(s): HGBA1C in the last 72 hours. CBG: Recent Labs  Lab 05/22/19 1748 05/23/19 0759  GLUCAP 120* 77   Lipid Profile: Recent Labs    05/24/19 0556  CHOL 248*  HDL 35*  LDLCALC 179*  TRIG 172*  CHOLHDL 7.1   Thyroid Function Tests: No results for input(s): TSH, T4TOTAL, FREET4, T3FREE, THYROIDAB in the last 72 hours. Anemia Panel: No results for input(s): VITAMINB12, FOLATE, FERRITIN, TIBC, IRON, RETICCTPCT in the last 72 hours. Sepsis Labs: No results for input(s): PROCALCITON, LATICACIDVEN in the last 168 hours.  Recent Results (from the past 240 hour(s))  Respiratory Panel by RT PCR (Flu A&B, Covid) - Nasopharyngeal Swab     Status: None   Collection Time: 05/23/19 12:08 PM   Specimen: Nasopharyngeal Swab  Result Value Ref Range Status   SARS Coronavirus 2 by RT PCR NEGATIVE NEGATIVE Final    Comment: (NOTE) SARS-CoV-2 target nucleic acids are NOT DETECTED. The SARS-CoV-2 RNA is generally detectable in upper respiratoy specimens during the acute phase of infection. The lowest concentration of SARS-CoV-2 viral copies this assay can detect is 131 copies/mL. A negative result does not preclude SARS-Cov-2 infection and should not be used as the sole basis for treatment or other  patient management decisions. A negative result may occur with  improper specimen collection/handling, submission of specimen other than nasopharyngeal swab, presence of viral mutation(s) within the areas targeted by this assay, and inadequate number of viral copies (<131 copies/mL). A negative result must be combined with clinical observations, patient history, and epidemiological information. The expected result is Negative. Fact Sheet for Patients:  PinkCheek.be Fact Sheet for Healthcare Providers:  GravelBags.it This test is not yet ap proved or cleared by the [place] FDA and  has been authorized for detection and/or diagnosis of SARS-CoV-2 by FDA under an Emergency Use Authorization (EUA). This EUA will remain  in effect (meaning this test can be used) for the duration of the COVID-19 declaration under Section 564(b)(1) of the Act, 21 U.S.C. section 360bbb-3(b)(1), unless the authorization is terminated or revoked sooner.    Influenza A by PCR NEGATIVE NEGATIVE Final   Influenza B by PCR NEGATIVE NEGATIVE Final    Comment: (NOTE) The Xpert  Xpress SARS-CoV-2/FLU/RSV assay is intended as an aid in  the diagnosis of influenza from Nasopharyngeal swab specimens and  should not be used as a sole basis for treatment. Nasal washings and  aspirates are unacceptable for Xpert Xpress SARS-CoV-2/FLU/RSV  testing. Fact Sheet for Patients: PinkCheek.be Fact Sheet for Healthcare Providers: GravelBags.it This test is not yet approved or cleared by the [place] FDA and  has been authorized for detection and/or diagnosis of SARS-CoV-2 by  FDA under an Emergency Use Authorization (EUA). This EUA will remain  in effect (meaning this test can be used) for the duration of the  Covid-19 declaration under Section 564(b)(1) of the Act, 21  U.S.C. section 360bbb-3(b)(1), unless  the authorization is  terminated or revoked. Performed at [facility], [place] [address], [place], [place] [address]       Radiology Studies: CT ANGIO HEAD W OR WO CONTRAST  Result Date: 05/23/2019 CLINICAL DATA:  Stroke follow-up. Acute pontine infarct on MRI. EXAM: CT ANGIOGRAPHY HEAD AND NECK TECHNIQUE: Multidetector CT imaging of the head and neck was performed using the standard protocol during bolus administration of intravenous contrast. Multiplanar CT image reconstructions and MIPs were obtained to evaluate the vascular anatomy. Carotid stenosis measurements (when applicable) are obtained utilizing NASCET criteria, using the distal internal carotid diameter as the denominator. CONTRAST:  64mL OMNIPAQUE IOHEXOL 350 MG/ML SOLN COMPARISON:  08/10/2018 chest CT report FINDINGS: CTA NECK FINDINGS Aortic arch: Standard 3 vessel aortic arch with widely patent arch vessel origins. Right carotid system: Patent without evidence of stenosis or dissection. Left carotid system: Patent with eccentric calcified plaque in the carotid bulb. No evidence of stenosis or dissection. Vertebral arteries: Patent and codominant without evidence of stenosis or dissection. Skeleton: Moderate cervical spondylosis. Other neck: No evidence of cervical lymphadenopathy or mass. Upper chest: Mildly enlarged right axillary, mediastinal, and right hilar lymph nodes, similar in size to those reported on a 08/10/2018 chest CT. Clear lung apices. Review of the MIP images confirms the above findings CTA HEAD FINDINGS Anterior circulation: The internal carotid arteries are patent from skull base to carotid termini with mild atherosclerosis but no evidence of significant stenosis. ACAs and MCAs are patent with mild branch vessel irregularity but no evidence of proximal branch occlusion or significant proximal stenosis. No aneurysm is identified. Posterior circulation: The intracranial vertebral arteries are patent to the basilar with  calcified plaque on the left not resulting in significant stenosis. There is a 2 mm medially directed outpouching from the proximal right V4 segment suggesting a small aneurysm (series 7, image 142). Patent left PICA, right AICA, and bilateral SCA origins are identified. The basilar artery is widely patent. There are large left and diminutive or absent right posterior communicating arteries with a hypoplastic left P1 segment. The PCAs are patent with mild branch vessel irregularity but no flow limiting proximal stenosis. Venous sinuses: Patent. Anatomic variants: Fetal left PCA. Review of the MIP images confirms the above findings IMPRESSION: 1. Mild atherosclerosis in the head and neck without large vessel occlusion or significant proximal stenosis. 2. 2 mm right V4 segment aneurysm. Electronically Signed   By: Logan Bores M.D.   On: 05/23/2019 13:58   DG Chest 2 View  Result Date: 05/23/2019 CLINICAL DATA:  Cerebral infarct.  Dizziness, nausea and emesis. EXAM: CHEST - 2 VIEW COMPARISON:  None. FINDINGS: Trachea is midline. Heart size is accentuated by AP technique and slightly low lung volumes. Lungs are clear. No pleural fluid. IMPRESSION: No  acute findings. Electronically Signed   By: Lorin Picket M.D.   On: 05/23/2019 13:10   CT HEAD WO CONTRAST  Result Date: 05/22/2019 CLINICAL DATA:  Dizziness, nausea, possible stroke EXAM: CT HEAD WITHOUT CONTRAST TECHNIQUE: Contiguous axial images were obtained from the base of the skull through the vertex without intravenous contrast. COMPARISON:  None. FINDINGS: Brain: No evidence of acute infarction, hemorrhage, hydrocephalus, extra-axial collection or mass lesion/mass effect. Vascular: No hyperdense vessel or unexpected calcification. Skull: Normal. Negative for fracture or focal lesion. Sinuses/Orbits: Partial fluid opacification of the right mastoid air cells, or alternately a small incidental venous lake of the mastoid (series 4, image 20). Other: None.  IMPRESSION: 1.  No acute intracranial pathology. 2. Partial fluid opacification of the right mastoid air cells, or alternately a small incidental venous lake of the mastoid (series 4, image 20). Correlate for signs and symptoms of mastoiditis. Electronically Signed   By: Eddie Candle M.D.   On: 05/22/2019 20:04   CT ANGIO NECK W OR WO CONTRAST  Result Date: 05/23/2019 CLINICAL DATA:  Stroke follow-up. Acute pontine infarct on MRI. EXAM: CT ANGIOGRAPHY HEAD AND NECK TECHNIQUE: Multidetector CT imaging of the head and neck was performed using the standard protocol during bolus administration of intravenous contrast. Multiplanar CT image reconstructions and MIPs were obtained to evaluate the vascular anatomy. Carotid stenosis measurements (when applicable) are obtained utilizing NASCET criteria, using the distal internal carotid diameter as the denominator. CONTRAST:  69mL OMNIPAQUE IOHEXOL 350 MG/ML SOLN COMPARISON:  08/10/2018 chest CT report FINDINGS: CTA NECK FINDINGS Aortic arch: Standard 3 vessel aortic arch with widely patent arch vessel origins. Right carotid system: Patent without evidence of stenosis or dissection. Left carotid system: Patent with eccentric calcified plaque in the carotid bulb. No evidence of stenosis or dissection. Vertebral arteries: Patent and codominant without evidence of stenosis or dissection. Skeleton: Moderate cervical spondylosis. Other neck: No evidence of cervical lymphadenopathy or mass. Upper chest: Mildly enlarged right axillary, mediastinal, and right hilar lymph nodes, similar in size to those reported on a 08/10/2018 chest CT. Clear lung apices. Review of the MIP images confirms the above findings CTA HEAD FINDINGS Anterior circulation: The internal carotid arteries are patent from skull base to carotid termini with mild atherosclerosis but no evidence of significant stenosis. ACAs and MCAs are patent with mild branch vessel irregularity but no evidence of proximal branch  occlusion or significant proximal stenosis. No aneurysm is identified. Posterior circulation: The intracranial vertebral arteries are patent to the basilar with calcified plaque on the left not resulting in significant stenosis. There is a 2 mm medially directed outpouching from the proximal right V4 segment suggesting a small aneurysm (series 7, image 142). Patent left PICA, right AICA, and bilateral SCA origins are identified. The basilar artery is widely patent. There are large left and diminutive or absent right posterior communicating arteries with a hypoplastic left P1 segment. The PCAs are patent with mild branch vessel irregularity but no flow limiting proximal stenosis. Venous sinuses: Patent. Anatomic variants: Fetal left PCA. Review of the MIP images confirms the above findings IMPRESSION: 1. Mild atherosclerosis in the head and neck without large vessel occlusion or significant proximal stenosis. 2. 2 mm right V4 segment aneurysm. Electronically Signed   By: Logan Bores M.D.   On: 05/23/2019 13:58   MR BRAIN WO CONTRAST  Result Date: 05/23/2019 CLINICAL DATA:  59 year old female with dizziness since yesterday with nausea. Vomiting began today. EXAM: MRI HEAD WITHOUT CONTRAST TECHNIQUE: Multiplanar,  multiecho pulse sequences of the brain and surrounding structures were obtained without intravenous contrast. COMPARISON:  Head CT 05/22/2019. FINDINGS: Brain: There is a patchy linear array of restricted diffusion in the brainstem affecting the left paracentral pons (series 5, image 68 and series 6, image 16). Mild associated T2 and FLAIR hyperintensity. No associated hemorrhage or mass effect. No other restricted diffusion identified. Normal cerebral volume. No midline shift, mass effect, evidence of mass lesion, ventriculomegaly, extra-axial collection or acute intracranial hemorrhage. Cervicomedullary junction and pituitary are within normal limits. Outside of the pons gray and white matter signal is  normal for age; minimal nonspecific cerebral white matter T2 and FLAIR hyperintensity. No chronic cerebral blood products identified. Vascular: Major intracranial vascular flow voids are preserved. Skull and upper cervical spine: Partially visible cervical spine disc and endplate degeneration. Mild if any upper cervical degenerative spinal stenosis. Visualized bone marrow signal is within normal limits. Absent dentition. Sinuses/Orbits: Mildly Disconjugate gaze, otherwise negative orbits. Bubbly opacity again noted in the right sphenoid sinus but the remaining paranasal sinuses are clear. Other: Posterior right mastoid air cell fluid again noted. The right tympanic cavity and remaining mastoids remain clear. Negative nasopharynx. Otherwise negative visible internal auditory structures. Scalp and face soft tissues appear negative. IMPRESSION: 1. Positive for acute lacunar type infarct in the left paracentral pons (pontine perforator branch). No associated hemorrhage or mass effect. 2. Otherwise negative for age noncontrast MRI appearance of the brain. 3. Mild inflammation in the right sphenoid sinus. Mild fluid in the posterior right mastoid air cells, probably postinflammatory. Electronically Signed   By: Genevie Ann M.D.   On: 05/23/2019 09:26   ECHOCARDIOGRAM COMPLETE  Result Date: 05/23/2019   ECHOCARDIOGRAM REPORT   Patient Name:   SHENETRA BRAKEMAN [facility] Date of Exam: 05/23/2019 Medical Rec #:  [id number]      Height:       63.0 in Accession #:    [id number]     Weight:       214.0 lb Date of Birth:  June 02, 1960      BSA:          1.99 m Patient Age:    47 years       BP:           145/80 mmHg Patient Gender: F              HR:           77 bpm. Exam Location:  Inpatient Procedure: 2D Echo, Color Doppler and Cardiac Doppler Indications:    Stroke  History:        Patient has no prior history of Echocardiogram examinations.                 Risk Factors:Hypertension and Sleep Apnea.  Sonographer:    Raquel Sarna Senior RDCS Referring  Phys: [phone] [place]  1. Left ventricular ejection fraction, by visual estimation, is 60 to 65%. The left ventricle has normal function. There is no left ventricular hypertrophy.  2. The left ventricle has no regional wall motion abnormalities.  3. Global right ventricle has normal systolic function.The right ventricular size is normal. No increase in right ventricular wall thickness.  4. Left atrial size was normal.  5. Right atrial size was normal.  6. The mitral valve is normal in structure. Trivial mitral valve regurgitation.  7. The tricuspid valve is normal in structure.  8. The aortic valve is normal in structure. Aortic valve regurgitation is not visualized.  9. The pulmonic valve was normal in structure. Pulmonic valve regurgitation is not visualized. 10. The atrial septum is grossly normal. FINDINGS  Left Ventricle: Left ventricular ejection fraction, by visual estimation, is 60 to 65%. The left ventricle has normal function. The left ventricle has no regional wall motion abnormalities. There is no left ventricular hypertrophy. Left ventricular diastolic parameters were normal. Right Ventricle: The right ventricular size is normal. No increase in right ventricular wall thickness. Global RV systolic function is has normal systolic function. Left Atrium: Left atrial size was normal in size. Right Atrium: Right atrial size was normal in size Pericardium: There is no evidence of pericardial effusion. Mitral Valve: The mitral valve is normal in structure. Trivial mitral valve regurgitation. Tricuspid Valve: The tricuspid valve is normal in structure. Tricuspid valve regurgitation is not demonstrated. Aortic Valve: The aortic valve is normal in structure. Aortic valve regurgitation is not visualized. Pulmonic Valve: The pulmonic valve was normal in structure. Pulmonic valve regurgitation is not visualized. Pulmonic regurgitation is not visualized. Aorta: The aortic root and ascending aorta  are structurally normal, with no evidence of dilitation. IAS/Shunts: The atrial septum is grossly normal.  LEFT VENTRICLE PLAX 2D LVIDd:         3.70 cm  Diastology LVIDs:         2.30 cm  LV e' lateral:   9.14 cm/s LV PW:         1.30 cm  LV E/e' lateral: 10.1 LV IVS:        1.00 cm  LV e' medial:    6.74 cm/s LVOT diam:     1.80 cm  LV E/e' medial:  13.7 LV SV:         40 ml LV SV Index:   18.83 LVOT Area:     2.54 cm  RIGHT VENTRICLE RV S prime:     11.10 cm/s TAPSE (M-mode): 2.2 cm LEFT ATRIUM             Index       RIGHT ATRIUM           Index LA diam:        3.10 cm 1.56 cm/m  RA Area:     13.10 cm LA Vol (A2C):   49.4 ml 24.82 ml/m RA Volume:   30.40 ml  15.28 ml/m LA Vol (A4C):   58.2 ml 29.25 ml/m LA Biplane Vol: 55.9 ml 28.09 ml/m  AORTIC VALVE LVOT Vmax:   119.00 cm/s LVOT Vmean:  84.800 cm/s LVOT VTI:    0.257 m  AORTA Ao Root diam: 2.60 cm Ao Asc diam:  3.00 cm MITRAL VALVE MV Area (PHT): 3.03 cm             SHUNTS MV PHT:        72.50 msec           Systemic VTI:  0.26 m MV Decel Time: 250 msec             Systemic Diam: 1.80 cm MV E velocity: 92.10 cm/s 103 cm/s MV A velocity: 77.10 cm/s 70.3 cm/s MV E/A ratio:  1.19       1.5  Mertie Moores MD Electronically signed by Mertie Moores MD Signature Date/Time: 05/23/2019/4:29:07 PM    Final       Scheduled Meds: .  stroke: mapping our early stages of recovery book   Does not apply Once  . clopidogrel  75 mg Oral Daily  . enoxaparin (LOVENOX)  injection  40 mg Subcutaneous Q24H  . gabapentin  300 mg Oral TID  . sodium chloride flush  3 mL Intravenous Once   Continuous Infusions: . sodium chloride    . sodium chloride 75 mL/hr at 05/23/19 1625     LOS: 1 day      Time spent: 35 minutes   Dessa Phi, DO [facility] 05/24/2019, 10:02 AM   Available via Epic secure chat 7am-7pm After these hours, please refer to coverage provider listed on [email or web address]

## 2019-05-24 NOTE — Evaluation (Signed)
Physical Therapy Evaluation Patient Details Name: Rhonda [name] MRN: [id number] DOB: 24-Feb-1961 Today's Date: 05/24/2019   History of Present Illness  59 y.o. female prediabetes,OSA, hypertension and arthritis.  Patient brought herself to the hospital secondary to having sudden onset of nausea and vomiting and blurred vision R visual field.  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   Clinical Impression  Pt was seen for evaluation of strength, balance, coordination and gait.  Her needs are significant for neuro outpatient care, and will benefit from RW for now to control her balance.  Have talked with pt about trying lesser devices tomorrow if she is staying another day, but today cannot manage with less.  Follow acutely for progression of balance challenges and to try more with stairs tomorrow.  See minimum of 4x per week for these ongoing challenges.    Follow Up Recommendations Outpatient PT(outpatient neuro clinic)    Equipment Recommendations  Rolling walker with 5" wheels    Recommendations for Other Services       Precautions / Restrictions Precautions Precautions: Fall Precaution Comments: dizzy with pivot turns Restrictions Weight Bearing Restrictions: No      Mobility  Bed Mobility Overal bed mobility: Modified Independent             General bed mobility comments: extra time to push up from sidelying  Transfers Overall transfer level: Needs assistance Equipment used: None Transfers: Sit to/from Stand;Stand Pivot Transfers Sit to Stand: Supervision Stand pivot transfers: Min assist       General transfer comment: walker steadied to min guard vs S  Ambulation/Gait Ambulation/Gait assistance: Min guard Gait Distance (Feet): 300 Feet(150 x 2) Assistive device: Rolling walker (2 wheeled);1 person hand held assist Gait Pattern/deviations: Step-through pattern;Decreased stride length;Wide base of support;Trunk flexed Gait velocity:  reduced Gait velocity interpretation: <1.31 ft/sec, indicative of household ambulator General Gait Details: pt tends to shift off balance to R without walker  Stairs            Wheelchair Mobility    Modified Rankin (Stroke Patients Only)       Balance Overall balance assessment: Needs assistance Sitting-balance support: Feet supported Sitting balance-[name]: Good     Standing balance support: Bilateral upper extremity supported;During functional activity Standing balance-[name]: Fair Standing balance comment: control increased with turns when using RW                             Pertinent Vitals/Pain Pain Assessment: No/denies pain    Home Living Family/patient expects to be discharged to:: Private residence Living Arrangements: Spouse/significant other Available Help at Discharge: Family;Available 24 hours/day Type of Home: House Home Access: Stairs to enter Entrance Stairs-Rails: None Entrance Stairs-Number of Steps: 4 Home Layout: Two level;1/2 bath on main level;Bed/bath upstairs Home Equipment: None Additional Comments: recommending RW but may be a rollator by tomorrow    Prior Function Level of Independence: Independent         Comments: works in [employer] in [place] Hand: Right    Extremity/Trunk Assessment   Upper Extremity Assessment Upper Extremity Assessment: Defer to OT evaluation RUE Deficits / Details: Pt states she has been "dropping things"; note mild sensorimotor deficits when using her cell phone RUE Coordination: decreased fine motor    Lower Extremity Assessment Lower Extremity Assessment: Generalized weakness;RLE deficits/detail RLE Deficits / Details: coordination changes  RLE Coordination: decreased gross motor LLE Deficits / Details: minor changes in coordination LLE Coordination: decreased gross motor(minor)       Communication   Communication: No  difficulties  Cognition Arousal/Alertness: Awake/alert Behavior During Therapy: WFL for tasks assessed/performed Overall Cognitive Status: No family/caregiver present to determine baseline cognitive functioning                                 General Comments: minor delays with responses      General Comments General comments (skin integrity, edema, etc.): pt is not having double vision when PT assessed her, reports R eye is very good    Exercises Other Exercises Other Exercises: began to review fine motor/coordination activities   Assessment/Plan    PT Assessment Patient needs continued PT services  PT Problem List Decreased strength;Decreased range of motion;Decreased activity tolerance;Decreased balance;Decreased mobility;Decreased coordination;Decreased knowledge of use of DME;Decreased safety awareness       PT Treatment Interventions DME instruction;Gait training;Stair training;Functional mobility training;Therapeutic activities;Therapeutic exercise;Balance training;Neuromuscular re-education;Patient/family education    PT Goals (Current goals can be found in the Care Plan section)  Acute Rehab PT Goals Patient Stated Goal: to go home PT Goal Formulation: With patient Time For Goal Achievement: 05/31/19 Potential to Achieve Goals: Good    Frequency Min 4X/week   Barriers to discharge Inaccessible home environment has stairs both inside and out    Co-evaluation               AM-PAC PT "6 Clicks" Mobility  Outcome Measure Help needed turning from your back to your side while in a flat bed without using bedrails?: None Help needed moving from lying on your back to sitting on the side of a flat bed without using bedrails?: A Little Help needed moving to and from a bed to a chair (including a wheelchair)?: A Little Help needed standing up from a chair using your arms (e.g., wheelchair or bedside chair)?: A Little Help needed to walk in hospital  room?: A Little Help needed climbing 3-5 steps with a railing? : A Lot 6 Click Score: 18    End of Session Equipment Utilized During Treatment: Gait belt Activity Tolerance: Patient tolerated treatment well;Treatment limited secondary to medical complications (Comment) Patient left: in bed;with call bell/phone within reach;with bed alarm set Nurse Communication: Mobility status PT Visit Diagnosis: Unsteadiness on feet (R26.81);Other abnormalities of gait and mobility (R26.89);Hemiplegia and hemiparesis Hemiplegia - Right/Left: Right Hemiplegia - dominant/non-dominant: Dominant Hemiplegia - caused by: Cerebral infarction    Time: 1035-1100 PT Time Calculation (min) (ACUTE ONLY): 25 min   Charges:   PT Evaluation $PT Eval Moderate Complexity: 1 Mod PT Treatments $Gait Training: 8-22 mins       Ramond Dial 05/24/2019, 11:19 AM   Mee Hives, PT MS Acute Rehab Dept. Number: [place] and [place]

## 2019-05-24 NOTE — Progress Notes (Signed)
Occupational Therapy Evaluation Patient Details Name: Rhonda [name] MRN: [id number] DOB: 24-Sep-1960 Today's Date: 05/24/2019    History of Present Illness 59 y.o. female prediabetes,OSA, hypertension and arthritis.  Patient brought herself to the hospital secondary to having sudden onset of nausea and vomiting and blurred vision R visual field.  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 Pt explained that she had double vision when she looked at her phone, but not at her TV. States this improved when she shut one eye. States her vision has improved but not back to baseline. Pt demonstrates mild sensorimotor deficits with RUE and states "I keep dropping things with my R hand". Pt demonstrates loss of balance with head turns/quick turns. Recommend pt follow up with [place] outpt OT to facilitate return to prior level of function. Pt verbalized understanding. Will follow acutely.     Follow Up Recommendations  Outpatient OT;Supervision - Intermittent(S with mobility)    Equipment Recommendations  3 in 1 bedside commode    Recommendations for Other Services PT consult     Precautions / Restrictions Precautions Precautions: Fall Restrictions Weight Bearing Restrictions: No      Mobility Bed Mobility Overal bed mobility: Modified Independent                Transfers Overall transfer level: Needs assistance   Transfers: Sit to/from Stand;Stand Pivot Transfers Sit to Stand: Supervision Stand pivot transfers: Min assist            Balance Overall balance assessment: Needs assistance Sitting-balance support: Feet supported Sitting balance-[name]: Good       Standing balance-[name]: Fair Standing balance comment: loses balance when turning head or with quick turns. Requires physical assistance form therapistto prevent  fall                           ADL either performed or assessed with clinical judgement   ADL                                               Vision Baseline Vision/History: Wears glasses Wears Glasses: Reading only(mostly) Patient Visual Report: Blurring of vision;Diplopia Vision Assessment?: Yes Eye Alignment: Within Functional Limits Ocular Range of Motion: Within Functional Limits Alignment/Gaze Preference: Within Defined Limits Tracking/Visual Pursuits: Decreased smoothness of horizontal tracking;Decreased smoothness of vertical tracking Saccades: Additional eye shifts occurred during testing Convergence: Impaired - to be further tested in functional context Visual Fields: No apparent deficits Diplopia Assessment: Other (comment) Additional Comments: Complained of diplopia in near vision initially which has improved but not returned to baseline. Pt states "it was much better if I closed 1 eye"     Perception Perception Comments: appeasr [facility]   Praxis Praxis Praxis tested?: Within functional limits    Pertinent Vitals/Pain Pain Assessment: No/denies pain     Hand Dominance Right   Extremity/Trunk Assessment Upper Extremity Assessment Upper Extremity Assessment: RUE deficits/detail RUE Deficits / Details: Pt states she has been "dropping things"; note mild sensorimotor deficits when using her cell phone RUE Coordination: decreased fine motor           Communication Communication Communication: No difficulties   Cognition Arousal/Alertness:  Awake/alert Behavior During Therapy: WFL for tasks assessed/performed Overall Cognitive Status: No family/caregiver present to determine baseline cognitive functioning                                 General Comments: Will further assess; slow processing   General Comments  enjoys readine her bible; greek mythology; has 9 grandchildren    Exercises Exercises: Other  exercises Other Exercises Other Exercises: began to review fine motor/coordination activities   Shoulder Instructions      Home Living Family/patient expects to be discharged to:: Private residence Living Arrangements: Spouse/significant other Available Help at Discharge: Family;Available 24 hours/day Type of Home: House Home Access: Stairs to enter [employer] of Steps: 4 Entrance Stairs-Rails: None Home Layout: Two level;1/2 bath on main level;Bed/bath upstairs Alternate Level Stairs-Number of Steps: flight Alternate Level Stairs-Rails: Right Bathroom Shower/Tub: Tub/shower unit;Curtain   Bathroom Toilet: Standard Bathroom Accessibility: Yes How Accessible: Accessible via walker Home Equipment: None          Prior Functioning/Environment Level of Independence: Independent        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        OT Problem List: Impaired balance (sitting and/or standing);Impaired vision/perception;Decreased coordination;Decreased safety awareness;Decreased knowledge of use of DME or AE;Impaired UE functional use      OT Treatment/Interventions: Self-care/ADL training;Therapeutic exercise;Neuromuscular education;DME and/or AE instruction;Therapeutic activities;Patient/family education;Balance training    OT Goals(Current goals can be found in the care plan section) Acute Rehab OT Goals Patient Stated Goal: to go home OT Goal Formulation: With patient Time For Goal Achievement: 06/07/19 Potential to Achieve Goals: Good  OT Frequency: Min 2X/week   Barriers to D/C:            Co-evaluation              AM-PAC OT "6 Clicks" Daily Activity     Outcome Measure Help from another person eating meals?: None Help from another person taking care of personal grooming?: A Little Help from another person toileting, which includes using toliet, bedpan, or urinal?: A Little Help from another person bathing (including washing,  rinsing, drying)?: A Little Help from another person to put on and taking off regular upper body clothing?: A Little Help from another person to put on and taking off regular lower body clothing?: A Little 6 Click Score: 19   End of Session Equipment Utilized During Treatment: Gait belt Nurse Communication: Mobility status  Activity Tolerance: Patient tolerated treatment well Patient left: in bed;with call bell/phone within reach;with bed alarm set  OT Visit Diagnosis: Other abnormalities of gait and mobility (R26.89);Low vision, both eyes (H54.2)                Time: [id number] OT Time Calculation (min): 31 min Charges:  OT General Charges $OT Visit: 1 Visit OT Evaluation $OT Eval Moderate Complexity: 1 Mod OT Treatments $Self Care/Home Management : 8-22 mins  Maurie Boettcher, OT/L   Acute OT Clinical Specialist Acute Rehabilitation Services Pager [phone] Office [phone]   [facility] 05/24/2019, 9:47 AM

## 2019-05-24 NOTE — TOC Transition Note (Signed)
Transition of Care [facility]) - CM/SW Discharge Note   Patient Details  Name: PACIENCE LAVERE MRN: [id number] Date of Birth: 06-30-1960  Transition of Care [facility]) CM/SW Contact:  Apolonio Schneiders, RN Phone Number: 05/24/2019, 6:10 PM   Clinical Narrative:     CM delivered 3N1 and rolling walker to the patient at the bedside.          Patient Goals and CMS Choice        Discharge Placement                       Discharge Plan and Services                DME Arranged: 3-N-1, Walker rolling DME Agency: [employer](CM delivered equipment to the patient at the bedside)                  Social Determinants of Health (SDOH) Interventions     Readmission Risk Interventions No flowsheet data found.

## 2019-05-25 ENCOUNTER — Telehealth: Payer: Self-pay

## 2019-05-25 LAB — HEMOGLOBIN A1C
Hgb A1c MFr Bld: 5.7 % — ABNORMAL HIGH (ref 4.8–5.6)
Mean Plasma Glucose: 117 mg/dL

## 2019-05-25 NOTE — Telephone Encounter (Signed)
Transition Care Management Follow-up Telephone Call Date of discharge and from where: 05/24/2019, [facility]   Call placed to patient.  She said that she is doing " okay' and noted that she now has a PCP at another practice and will be following up there.

## 2019-05-30 ENCOUNTER — Other Ambulatory Visit: Payer: Self-pay | Admitting: *Deleted

## 2019-05-30 NOTE — Addendum Note (Signed)
Addended by: Barbaraann Faster on: 05/30/2019 02:45 PM   Modules accepted: Orders

## 2019-05-30 NOTE — Patient Outreach (Signed)
  [place] [facility]) Care Management  05/30/2019  CELECIA HOHLT [birth year] [id number]   EMMI- stroke  RED ON EMMI ALERT Day # 1 Date: Friday 05/27/19 1000 Red Alert Reason: Scheduled a follow-up appointment? No   Insurance: blue cross and blue shield  [facility] admissions x 1 ED visits x 1 in the last 6 months  Last hospitalization: 05/22/19-05/24/19 discharged with outpatient PT/OT  Outreach attempt # 1 No answer. [facility] RN CM left HIPAA compliant voicemail message along with CM's contact info.     Conditions: acute lacunar infarct of the left paracentral pons per MRI, HTN, arthritis, pre diabetes, OSA, transaminitis, leukocytosis, N/V, fatty liver, 05/23/19 SAR negative   Plan: [facility] RN CM sent an unsuccessful outreach letter and scheduled this patient for another call attempt within 4 business days   Earsie Humm L. Lavina Hamman, RN, BSN, [place] Coordinator Office number [phone] Mobile number [phone]  Main [facility] number [phone] Fax number [phone]

## 2019-05-30 NOTE — Patient Outreach (Signed)
  [place] [facility]) Care Management  05/30/2019  Rhonda [name] 1960-12-30 [id number]   EMMI- stroke  Not on APL   RED ON EMMI ALERT Day # 1 Date: Friday 05/27/19 1000 Red Alert Reason: Scheduled a follow-up appointment? No   Insurance: blue cross and blue shield  [facility] admissions x 1 ED visits x 1 in the last 6 months  Last hospitalization: 05/22/19-05/24/19 discharged with outpatient PT/OT  Patient returned a call to [place] Patient is able to verify HIPAA, DOB and Address Reviewed and addressed EMMI red alert  EMMI Rhonda Rhonda [name] reports she does have a hospital follow up appointment with Precious Haws this afternoon, 05/30/19. She confirms she is seen by Dr Talitha Givens, [place] [facility] Neurologist  for her OSA but has not made a follow up appointment with him. Shared with her that she is also more than welcome to follow up with Dr Leonie Man. She states she would prefer to continue to see Dr Talitha Givens neurology even for her CVA and she will make a hospital follow up appointment independently. [facility] RN CM offered to send Dr Talitha Givens hospital discharge and [facility] RN CM note with her agreement.   Rhonda [name] continues with some slurred and pauses in speech during this call contact but noted to do well when she talks slowly   [place] offered [place] and health coach services but Rhonda [name] denies the need at this time. She reports having care needs "under control"  Social: Rhonda Rhonda [name] is a 59 year old patient who works part time for the [employer]. She reports she has a large support system to include her husband, Rhonda [name]. She denies issues with food insecurity, transportation to medical appointments issues nor housing issues She is independent/assist in care needs  Conditions: acute lacunar infarct of the left paracentral pons per MRI, HTN, arthritis, pre diabetes, OSA, transaminitis, leukocytosis, piriformis, N/V, fatty liver, chronic low back pain, right hip  pain, HLD,  vitamin D deficiency, 05/23/19 SAR negative   DME: 3 n 1, Rolling walker   Medications: She denies concerns with taking medications as prescribed, affording medications, side effects of medications and questions about medications  Appointments: Precious Haws, primary care provider (PCP) to be seen 05/30/19    Advance Directives: She voices interest in getting assistance for getting the advance directive forms and completing them Agreed to [facility] SW referral   Consent: [facility] RN CM reviewed [facility] services with patient. Patient gave verbal consent for [facility] telephonic RN CM and [facility] SW services.  Plans [facility] RN CM will refer Rhonda Neuffer to [facility] SW for assistance with getting advance directive forms and completing  [facility] RN CM will follow up with [facility] within 14-21 days on advance directive assistance and again assess for any further needs   Pt encouraged to return a call to [place] CM prn  [facility] RN CM discussed the outreach letter already sent on with [facility] brochure enclosed for review  Routed note to MDs/NP/PA   [place]. Lavina Hamman, RN, BSN, [place] Coordinator Office number [phone] Mobile number [phone]  Main [facility] number [phone] Fax number [phone]

## 2019-05-31 ENCOUNTER — Ambulatory Visit: Payer: BC Managed Care – PPO | Admitting: *Deleted

## 2019-06-02 ENCOUNTER — Other Ambulatory Visit: Payer: Self-pay

## 2019-06-02 NOTE — Patient Outreach (Signed)
[place] [facility]) Care Management  06/02/2019  Rhonda [name] 06-09-1960 [id number]   Successful outreach to patient today regarding social work referral for assistance with Advance Directives.  Will send Advance Directive Emmi and packet. Will follow up within the next two weeks to ensure receipt and assist with completion if needed.   [name], BSW Social Worker [phone]

## 2019-06-13 ENCOUNTER — Ambulatory Visit: Payer: Self-pay | Admitting: *Deleted

## 2019-06-14 ENCOUNTER — Other Ambulatory Visit: Payer: Self-pay | Admitting: *Deleted

## 2019-06-14 NOTE — Patient Outreach (Signed)
[place] [facility]) Care Management  06/14/2019  CARMINA SIEMEN 06-09-1960 [id number]   [facility] follow up call to EMMI stroke referred patient- Unsuccessful outreach    Insurance: blue cross and blue shield  [facility] admissions x 1 ED visits x 1 in the last 6 months  Last hospitalization: 05/22/19-05/24/19 discharged with outpatient PT/OT  Mrs Ida was referred on 05/30/19 for EMMI red alert reason Scheduled a follow-up appointment? No. She confirmed she had scheduled appointment with Dr Precious Haws for 05/30/19  Outreach attempt # 2 No answer. [facility] RN CM left HIPAA compliant voicemail message along with CM's contact info.   Social: Mrs Lotts is a 59 year old patient who works part time for the [employer]. She reports she has a large support system to include her husband, [name]. She denies issues with food insecurity, transportation to medical appointments issues nor housing issues She is independent/assist in care needs  Conditions: acute lacunar infarct of the left paracentral pons per MRI, HTN, arthritis, pre diabetes, OSA, transaminitis, leukocytosis, N/V, fatty liver, 05/23/19 SAR negative   DME: 3 n 1, Rolling walker   Plan: [facility] RN CM scheduled this [facility] engaged patient for another call attempt within 4 business days   Shaquana Buel L. Lavina Hamman, RN, BSN, [place] Coordinator Office number [phone] Mobile number [phone]  Main [facility]N number [phone] Fax number [phone]

## 2019-06-16 ENCOUNTER — Other Ambulatory Visit: Payer: Self-pay

## 2019-06-16 NOTE — Patient Outreach (Signed)
[place] [facility]) Care Management  06/16/2019  Rhonda [name] [birth year] [id number]   Follow up call to patient to ensure receipt of/review Advance Directives.  Patient did receive documentation and briefly reviewed it but plans to review further with her daughter.  Offered to schedule another follow up call to answer any questions she or daughter may have but patient stated that she would call me back if needed.  Social Work case being closed.  [name], BSW Social Worker [phone]

## 2019-06-17 ENCOUNTER — Other Ambulatory Visit: Payer: Self-pay | Admitting: *Deleted

## 2019-06-17 ENCOUNTER — Other Ambulatory Visit: Payer: Self-pay

## 2019-06-17 NOTE — Patient Outreach (Signed)
[place] [facility]) Care Management  06/17/2019  [name] 01-10-1961 [id number]   [facility] follow up and case closure for EMMI stroke (not on APL)     Insurance:blue cross and blue shield  [facility] admissions x1ED visits x 1in the last 6 months  Last hospitalization: 05/22/19-05/24/19 discharged with outpatient PT/OT  Successful outreach to [name] Patient is able to verify HIPAA, DOB and Address  Consent: [facility] RN CM reviewed [facility] services with patient. Patient gave verbal consent for [facility] telephonic RN CM and [facility] SW services  [name] noted with less slurring and pauses in her speech during this call contact but noted to do well when she talks slowly  [name] continues to deny [facility] services She reports she is doing "good"  She confirms with [facility] RN CM that the [place] made contact with her and sent her the Advance directive forms. She voiced her appreciation for services rendered   Plans [facility] RN CM will close case at this time as patient has been assessed and no needs identified/needs resolved.   Pt encouraged to return a call to [facility] RN CM prn  Timberly Yott L. Lavina Hamman, RN, BSN, [place] Coordinator Office number [phone] Mobile number [phone]  Main [facility]N number [phone] Fax number [phone]

## 2019-08-04 ENCOUNTER — Emergency Department (HOSPITAL_COMMUNITY): Payer: BLUE CROSS/BLUE SHIELD

## 2019-08-04 ENCOUNTER — Emergency Department (HOSPITAL_COMMUNITY)
Admission: EM | Admit: 2019-08-04 | Discharge: 2019-08-04 | Disposition: A | Discharge status: Home / Self Care | Payer: BLUE CROSS/BLUE SHIELD | Attending: Emergency Medicine | Admitting: Emergency Medicine

## 2019-08-04 ENCOUNTER — Other Ambulatory Visit: Payer: Self-pay

## 2019-08-04 DIAGNOSIS — R791 Abnormal coagulation profile: Secondary | ICD-10-CM | POA: Diagnosis not present

## 2019-08-04 DIAGNOSIS — R42 Dizziness and giddiness: Secondary | ICD-10-CM | POA: Diagnosis present

## 2019-08-04 DIAGNOSIS — R112 Nausea with vomiting, unspecified: Secondary | ICD-10-CM | POA: Diagnosis not present

## 2019-08-04 DIAGNOSIS — I10 Essential (primary) hypertension: Secondary | ICD-10-CM | POA: Diagnosis not present

## 2019-08-04 DIAGNOSIS — H81399 Other peripheral vertigo, unspecified ear: Secondary | ICD-10-CM

## 2019-08-04 DIAGNOSIS — Z79899 Other long term (current) drug therapy: Secondary | ICD-10-CM | POA: Diagnosis not present

## 2019-08-04 DIAGNOSIS — I639 Cerebral infarction, unspecified: Secondary | ICD-10-CM

## 2019-08-04 LAB — DIFFERENTIAL
Abs Immature Granulocytes: 0.08 10*3/uL — ABNORMAL HIGH (ref 0.00–0.07)
Basophils Absolute: 0 10*3/uL (ref 0.0–0.1)
Basophils Relative: 0 %
Eosinophils Absolute: 0.1 10*3/uL (ref 0.0–0.5)
Eosinophils Relative: 1 %
Immature Granulocytes: 1 %
Lymphocytes Relative: 32 %
Lymphs Abs: 4 10*3/uL (ref 0.7–4.0)
Monocytes Absolute: 1 10*3/uL (ref 0.1–1.0)
Monocytes Relative: 8 %
Neutro Abs: 7.3 10*3/uL (ref 1.7–7.7)
Neutrophils Relative %: 58 %

## 2019-08-04 LAB — CBG MONITORING, ED: Glucose-Capillary: 83 mg/dL (ref 70–99)

## 2019-08-04 LAB — CBC
HCT: 39.8 % (ref 36.0–46.0)
Hemoglobin: 13 g/dL (ref 12.0–15.0)
MCH: 27.8 pg (ref 26.0–34.0)
MCHC: 32.7 g/dL (ref 30.0–36.0)
MCV: 85 fL (ref 80.0–100.0)
Platelets: 389 10*3/uL (ref 150–400)
RBC: 4.68 MIL/uL (ref 3.87–5.11)
RDW: 16.5 % — ABNORMAL HIGH (ref 11.5–15.5)
WBC: 12.4 10*3/uL — ABNORMAL HIGH (ref 4.0–10.5)
nRBC: 0 % (ref 0.0–0.2)

## 2019-08-04 LAB — COMPREHENSIVE METABOLIC PANEL
ALT: 65 U/L — ABNORMAL HIGH (ref 0–44)
AST: 94 U/L — ABNORMAL HIGH (ref 15–41)
Albumin: 3.5 g/dL (ref 3.5–5.0)
Alkaline Phosphatase: 64 U/L (ref 38–126)
Anion gap: 16 — ABNORMAL HIGH (ref 5–15)
BUN: 8 mg/dL (ref 6–20)
CO2: 21 mmol/L — ABNORMAL LOW (ref 22–32)
Calcium: 9.2 mg/dL (ref 8.9–10.3)
Chloride: 96 mmol/L — ABNORMAL LOW (ref 98–111)
Creatinine, Ser: 0.77 mg/dL (ref 0.44–1.00)
GFR calc Af Amer: >60 mL/min (ref >60)
GFR calc non Af Amer: >60 mL/min (ref >60)
Glucose, Bld: 126 mg/dL — ABNORMAL HIGH (ref 70–99)
Potassium: 3.2 mmol/L — ABNORMAL LOW (ref 3.5–5.1)
Sodium: 133 mmol/L — ABNORMAL LOW (ref 135–145)
Total Bilirubin: 0.7 mg/dL (ref 0.3–1.2)
Total Protein: 7.9 g/dL (ref 6.5–8.1)

## 2019-08-04 LAB — I-STAT BETA HCG BLOOD, ED (MC, WL, AP ONLY): I-stat hCG, quantitative: <5 m[IU]/mL (ref <5)

## 2019-08-04 LAB — I-STAT CHEM 8, ED
BUN: 10 mg/dL (ref 6–20)
Calcium, Ion: 0.99 mmol/L — ABNORMAL LOW (ref 1.15–1.40)
Chloride: 98 mmol/L (ref 98–111)
Creatinine, Ser: 0.6 mg/dL (ref 0.44–1.00)
Glucose, Bld: 123 mg/dL — ABNORMAL HIGH (ref 70–99)
HCT: 41 % (ref 36.0–46.0)
Hemoglobin: 13.9 g/dL (ref 12.0–15.0)
Potassium: 5 mmol/L (ref 3.5–5.1)
Sodium: 131 mmol/L — ABNORMAL LOW (ref 135–145)
TCO2: 29 mmol/L (ref 22–32)

## 2019-08-04 LAB — PROTIME-INR
INR: 1 (ref 0.8–1.2)
Prothrombin Time: 13.2 seconds (ref 11.4–15.2)

## 2019-08-04 LAB — APTT: aPTT: 29 seconds (ref 24–36)

## 2019-08-04 MED ORDER — MECLIZINE HCL 25 MG PO TABS: 25.0000 mg | ORAL_TABLET | Freq: Three times a day (TID) | ORAL | 0 refills | Status: DC | PRN

## 2019-08-04 MED ORDER — SODIUM CHLORIDE 0.9% FLUSH: 3.0000 mL | Freq: Once | INTRAVENOUS | Status: DC

## 2019-08-04 NOTE — ED Notes (Signed)
Neurology PA to triage to assess pt.

## 2019-08-04 NOTE — ED Provider Notes (Signed)
[facility]ER[facility] DEPARTMENT Provider Note   CSN: [id number] Arrival date & time: 08/04/19  1305     History No chief complaint on file.   Rhonda [name] is a 59 y.o. female.  HPI  59 year old female with a history of acute CVA in January 21 of this year, hypertension presents to the ER with sudden onset of dizziness, nausea, vomiting, unsteady gait going to the bathroom at around 12 PM today.  History provided by the patient. She attempted to drink some water and immediately vomited it up.   She refers that the symptoms are very similar to the ones she had with her last stroke.  She denies vision or speech changes, headaches, unilateral motor deficits, fevers, neck stiffness, bloody or bilious emesis, abdominal pain.  No ear pain, tinnitus, hearing loss, headaches, palpitations, chest pain, syncope.  She notes her dizziness and nausea improves with her eyes closed, but denies sensitivity to light. She is currently taking baby aspirin as prescribed.    Past Medical History:  Diagnosis Date  . Arthritis   . Degenerative disc disease, lumbar   . Hypertension   . Prediabetes     Patient Active Problem List   Diagnosis Date Noted  . Acute CVA (cerebrovascular accident) ([place]) 05/23/2019  . Transaminitis 05/23/2019  . Nausea and vomiting 05/23/2019  . Leukocytosis 05/23/2019  . Thoracic lymphadenopathy 08/26/2018  . Axillary lymphadenopathy 08/26/2018  . Piriformis syndrome of right side 09/29/2016  . OSA (obstructive sleep apnea) 09/27/2015  . Right hip pain 04/11/2015  . Essential hypertension 11/07/2014  . Chronic low back pain 11/07/2014  . Prediabetes 11/07/2014  . Vitamin D deficiency 11/07/2014    Past Surgical History:  Procedure Laterality Date  . ABDOMINAL HYSTERECTOMY    . BREAST EXCISIONAL BIOPSY Right   . BREAST SURGERY     right breast  . CESAREAN SECTION       OB History    Gravida  5   Para      Term      Preterm      AB  1    Living  4     SAB  1   TAB      Ectopic      Multiple  1   Live Births  4           Family History  Problem Relation Age of Onset  . Cancer Mother   . Hypertension Sister   . Diabetes Sister   . Diabetes Maternal Grandmother     Social History   Tobacco Use  . Smoking status: Never Smoker  . Smokeless tobacco: Never Used  Substance Use Topics  . Alcohol use: No  . Drug use: No    Home Medications Prior to Admission medications   Medication Sig Start Date End Date Taking? Authorizing Provider  acetaminophen (TYLENOL) 650 MG CR tablet Take 1,300 mg by mouth daily as needed (arthritis pain).   Yes [provider]  APPLE CIDER VINEGAR PO Take 2 capsules by mouth at bedtime.   Yes [provider]  aspirin EC 81 MG tablet Take 1 tablet (81 mg total) by mouth daily. 05/24/19 05/23/20 Yes Dessa Phi, DO  atorvastatin (LIPITOR) 80 MG tablet Take 1 tablet (80 mg total) by mouth daily at 6 PM. 05/24/19 08/22/19 Yes Dessa Phi, DO  Cyanocobalamin (VITAMIN B-12 PO) Take 1 tablet by mouth daily.    Yes [provider]  diclofenac Sodium (VOLTAREN) 1 %  GEL Apply 2 g topically 2 (two) times daily as needed (pain).   Yes [provider]  gabapentin (NEURONTIN) 300 MG capsule TAKE 2 CAPSULES BY MOUTH 3 (THREE) TIMES DAILY. Patient taking differently: Take 300 mg by mouth 3 (three) times daily.  03/24/18  Yes Gildardo Pounds, NP  glucosamine-chondroitin 500-400 MG tablet Take 1 tablet by mouth 3 (three) times daily. Patient taking differently: Take 2 tablets by mouth daily.  04/11/15  Yes Funches, Adriana Mccallum, MD  lisinopril-hydrochlorothiazide (ZESTORETIC) 10-12.5 MG tablet Take 1 tablet by mouth every evening.  07/11/19  Yes [provider]  Multiple Vitamin (MULTIVITAMIN WITH MINERALS) TABS tablet Take 1 tablet by mouth daily.   Yes [provider]  Omega-3 Fatty Acids (FISH OIL) 1000 MG CAPS Take 1,000 mg by mouth at bedtime.    Yes [provider]  pantoprazole (PROTONIX) 40 MG tablet Take 40 mg by mouth 2 (two) times daily. 07/16/19  Yes [provider]  clopidogrel (PLAVIX) 75 MG tablet Take 1 tablet (75 mg total) by mouth daily. Patient not taking: Reported on 08/04/2019 05/25/19   Dessa Phi, DO  PRESCRIPTION MEDICATION Inhale into the lungs at bedtime. CPAP    [provider]    Allergies    Aspirin and Augmentin [amoxicillin-pot clavulanate]  Review of Systems   Review of Systems  Constitutional: Negative for activity change, fatigue and fever.  HENT: Negative for ear pain, hearing loss, sinus pressure, sinus pain, tinnitus, trouble swallowing and voice change.   Eyes: Negative for photophobia, pain and visual disturbance.  Respiratory: Negative for cough, chest tightness and shortness of breath.   Cardiovascular: Negative for chest pain and leg swelling.  Gastrointestinal: Positive for nausea and vomiting. Negative for abdominal distention, abdominal pain, constipation and diarrhea.  Genitourinary: Negative for dysuria, flank pain and pelvic pain.  Musculoskeletal: Negative for back pain, myalgias, neck pain and neck stiffness.  Neurological: Positive for dizziness and weakness. Negative for tremors, seizures, syncope, speech difficulty, light-headedness, numbness and headaches.  Psychiatric/Behavioral: Negative for confusion and decreased concentration.    Physical Exam Updated Vital Signs BP 132/80   Pulse 85   Temp 98.1 F (36.7 C) (Oral)   Resp 15   SpO2 98%   Physical Exam Eyes:     Extraocular Movements: Extraocular movements intact.     Comments: Left pupil slightly larger than right pupil.  Physical exam upon triage in ED during last stroke notes pupils were equal.  Unsure if this is a acute change or chronic.  Horizontal nystagmus noted in right eye.  Cardiovascular:     Rate and Rhythm: Normal rate and regular rhythm.     Pulses: Normal pulses.     Heart  sounds: Normal heart sounds.  Pulmonary:     Effort: Pulmonary effort is normal.     Breath sounds: Normal breath sounds.  Abdominal:     General: Abdomen is flat. There is no distension.     Palpations: Abdomen is soft.     Tenderness: There is no guarding.  Musculoskeletal:        General: Normal range of motion.     Cervical back: Normal range of motion. No rigidity.     Comments: Strength intact bilaterally in upper and lower extremities.  Neurological:     Mental Status: She is alert and oriented to person, place, and time.     Cranial Nerves: Cranial nerve deficit present.     Sensory: Sensation is intact.  Motor: No weakness, tremor, abnormal muscle tone, seizure activity or pronator drift.     Coordination: Romberg sign negative. Coordination normal. Finger-Nose-Finger Test and Heel to [facility] Test normal.     Deep Tendon Reflexes: Reflexes are normal and symmetric.     ED Results / Procedures / Treatments   Labs (all labs ordered are listed, but only abnormal results are displayed) Labs Reviewed  CBC - Abnormal; Notable for the following components:      Result Value   WBC 12.4 (*)    RDW 16.5 (*)    All other components within normal limits  DIFFERENTIAL - Abnormal; Notable for the following components:   Abs Immature Granulocytes 0.08 (*)    All other components within normal limits  COMPREHENSIVE METABOLIC PANEL - Abnormal; Notable for the following components:   Sodium 133 (*)    Potassium 3.2 (*)    Chloride 96 (*)    CO2 21 (*)    Glucose, Bld 126 (*)    AST 94 (*)    ALT 65 (*)    Anion gap 16 (*)    All other components within normal limits  I-STAT CHEM 8, ED - Abnormal; Notable for the following components:   Sodium 131 (*)    Glucose, Bld 123 (*)    Calcium, Ion 0.99 (*)    All other components within normal limits  PROTIME-INR  APTT  CBG MONITORING, ED  I-STAT BETA HCG BLOOD, ED ([facility], [facility], [facility] ONLY)    EKG EKG  Interpretation  Date/Time:  Thursday August 04 2019 13:11:01 EDT Ventricular Rate:  77 PR Interval:  130 QRS Duration: 92 QT Interval:  404 QTC Calculation: 457 R Axis:   53 Text Interpretation: Unusual P axis and short PR, probable junctional tachycardia Nonspecific ST and T wave abnormality Abnormal ECG No acute changes No significant change since last tracing Confirmed by Varney Biles [phone]) on 08/04/2019 2:57:07 PM   Radiology No results found.  Procedures Procedures (including critical care time)  Medications Ordered in ED Medications  sodium chloride flush (NS) 0.9 % injection 3 mL (has no administration in time range)    ED Course  I have reviewed the triage vital signs and the nursing notes.  Pertinent labs & imaging results that were available during my care of the patient were reviewed by me and considered in my medical decision making (see chart for details).  Clinical Course as of Aug 03 1605  Thu Aug 04, 2019  1511 Code stroke initiated   [MB]    Clinical Course User Index [MB] Lyndel Safe   MDM Rules/Calculators/A&P 59 year old female with a history of CVA in January 2021 presents to the ER for sudden onset of nausea, dizziness, vomiting.  Last known normal 12 PM. -DDX: stroke/ tia/hemorrage,vestibular neuritis, infection, hypertensive urgency/emergency, acute angle closure glaucoma, labyrinthitis, BBPV, meningitis -Patient hypertensive at admission with a BP of 189/111.  Her blood pressure has stabilized to 123/91 without any intervention.  -Code stroke was initiated after evaluation of the patient.  Slight diplopia noticed on physical exam.  No other neuro deficits on physical exam. -Labs suggest possible dehydration with very mildly elevated anion gap.  Given the patient is a code stroke, will keep n.p.o. -CT head pending.  Anticipate need for further follow-up MRI -After evaluating the patient, I did find mild diplopia with the left pupil being  slightly larger than the right.  No other focal neuro deficits were found.  Discussed case with  Dr. Kathrynn Humble.  After he evaluated the patient, a code stroke was called.  Pending hospitalist consult for admission. Patient is resting comfortably in the room, neuro status remains unchanged. -Signed out patient to Domenic Moras, PA-C                  Final Clinical Impression(s) / ED Diagnoses Final diagnoses:  None    Rx / DC Orders ED Discharge Orders    None       Garald Balding, PA-C 08/04/19 1610    Varney Biles, MD 08/08/19 [phone]

## 2019-08-04 NOTE — ED Provider Notes (Signed)
Dizziness, nausea, vomiting since 12pm. Neuro have been consulted.  Head CT ordered.  Anticipate admission if positive for posterior circulation stroke, otherwise can be discharge..  Last stroke Jan, currently on baby ASA.    6:37 PM Received signout from previous provider, please see her note for complete H&P.  Patient who had a stroke last January currently on baby aspirin presenting with complaints of dizziness nausea vomiting since 12 PM today.  Neurology has been consulted and initial head CT scan obtained without any acute finding.  Per neurology request, a brain MRI was ordered to rule out stroke.  Fortunately brain MRI without acute stroke.  Patient symptoms brought on by positional changes likely benign proximal positional vertigo.  Patient felt reassured after receiving the impression of the MRI. She report her sxs has improved from prior. Will discharge home with meclizine and outpatient follow-up. Return precaution discussed.  BP 125/84   Pulse 74   Temp 98.1 F (36.7 C) (Oral)   Resp (!) 26   SpO2 98%   Results for orders placed or performed during the hospital encounter of 08/04/19  Protime-INR  Result Value Ref Range   Prothrombin Time 13.2 11.4 - 15.2 seconds   INR 1.0 0.8 - 1.2  APTT  Result Value Ref Range   aPTT 29 24 - 36 seconds  CBC  Result Value Ref Range   WBC 12.4 (H) 4.0 - 10.5 K/uL   RBC 4.68 3.87 - 5.11 MIL/uL   Hemoglobin 13.0 12.0 - 15.0 g/dL   HCT 39.8 36.0 - 46.0 %   MCV 85.0 80.0 - 100.0 fL   MCH 27.8 26.0 - 34.0 pg   MCHC 32.7 30.0 - 36.0 g/dL   RDW 16.5 (H) 11.5 - 15.5 %   Platelets 389 150 - 400 K/uL   nRBC 0.0 0.0 - 0.2 %  Differential  Result Value Ref Range   Neutrophils Relative % 58 %   Neutro Abs 7.3 1.7 - 7.7 K/uL   Lymphocytes Relative 32 %   Lymphs Abs 4.0 0.7 - 4.0 K/uL   Monocytes Relative 8 %   Monocytes Absolute 1.0 0.1 - 1.0 K/uL   Eosinophils Relative 1 %   Eosinophils Absolute 0.1 0.0 - 0.5 K/uL   Basophils Relative 0 %    Basophils Absolute 0.0 0.0 - 0.1 K/uL   Immature Granulocytes 1 %   Abs Immature Granulocytes 0.08 (H) 0.00 - 0.07 K/uL  Comprehensive metabolic panel  Result Value Ref Range   Sodium 133 (L) 135 - 145 mmol/L   Potassium 3.2 (L) 3.5 - 5.1 mmol/L   Chloride 96 (L) 98 - 111 mmol/L   CO2 21 (L) 22 - 32 mmol/L   Glucose, Bld 126 (H) 70 - 99 mg/dL   BUN 8 6 - 20 mg/dL   Creatinine, Ser 0.77 0.44 - 1.00 mg/dL   Calcium 9.2 8.9 - 10.3 mg/dL   Total Protein 7.9 6.5 - 8.1 g/dL   Albumin 3.5 3.5 - 5.0 g/dL   AST 94 (H) 15 - 41 U/L   ALT 65 (H) 0 - 44 U/L   Alkaline Phosphatase 64 38 - 126 U/L   Total Bilirubin 0.7 0.3 - 1.2 mg/dL   GFR calc non Af Amer >60 >60 mL/min   GFR calc Af Amer >60 >60 mL/min   Anion gap 16 (H) 5 - 15  I-stat chem 8, ED  Result Value Ref Range   Sodium 131 (L) 135 - 145 mmol/L  Potassium 5.0 3.5 - 5.1 mmol/L   Chloride 98 98 - 111 mmol/L   BUN 10 6 - 20 mg/dL   Creatinine, Ser 0.60 0.44 - 1.00 mg/dL   Glucose, Bld 123 (H) 70 - 99 mg/dL   Calcium, Ion 0.99 (L) 1.15 - 1.40 mmol/L   TCO2 29 22 - 32 mmol/L   Hemoglobin 13.9 12.0 - 15.0 g/dL   HCT 41.0 36.0 - 46.0 %  CBG monitoring, ED  Result Value Ref Range   Glucose-Capillary 83 70 - 99 mg/dL  I-Stat beta hCG blood, ED  Result Value Ref Range   I-stat hCG, quantitative <5.0 <5 mIU/mL   Comment 3           CT HEAD WO CONTRAST  Result Date: 08/04/2019 CLINICAL DATA:  Ataxia, stroke suspected. Additional history provided: Patient via EMS from home with sudden onset dizziness, emesis and nausea beginning at 12 p.m. today, history of CVA in January with residual balance issues EXAM: CT HEAD WITHOUT CONTRAST TECHNIQUE: Contiguous axial images were obtained from the base of the skull through the vertex without intravenous contrast. COMPARISON:  CT angiogram head/neck 05/23/2019, brain MRI 05/23/2019, head CT 05/22/2019 FINDINGS: Brain: There is no evidence of acute intracranial hemorrhage, intracranial mass,  midline shift or extra-axial fluid collection.No demarcated cortical infarction. Hypodensity within the paramedian left pons at site of a known, now chronic, infarct (series 2, image 7). Cerebral volume is normal for age. Vascular: No hyperdense vessel.  Atherosclerotic calcifications. Skull: Normal. Negative for fracture or focal lesion. Sinuses/Orbits: Visualized orbits demonstrate no acute abnormality. Mild ethmoid sinus mucosal thickening. Right mastoid effusion. IMPRESSION: No CT evidence of acute intracranial abnormality. Hypodensity within the paramedian left pons at site of a known, now chronic, infarct. Minimal ethmoid sinus mucosal thickening.  Right mastoid effusion. Electronically Signed   By: [name] DO   On: 08/04/2019 16:08   MR BRAIN WO CONTRAST  Result Date: 08/04/2019 CLINICAL DATA:  Dizziness, nonspecific. Additional history provided: Patient arrives from home via EMS with sudden onset dizziness, emesis and nausea beginning at 12 p.m. today. History of CVA in January with residual balance issues. EXAM: MRI HEAD WITHOUT CONTRAST TECHNIQUE: Multiplanar, multiecho pulse sequences of the brain and surrounding structures were obtained without intravenous contrast. COMPARISON:  Noncontrast head CT performed earlier the same day, CT angiogram head/neck 05/23/2019, brain MRI 05/23/2019 FINDINGS: Brain: There is no evidence of acute infarct. No evidence of intracranial mass. No midline shift or extra-axial fluid collection. No chronic intracranial blood products. Redemonstrated are a few small scattered foci of T2/FLAIR hyperintensity within the cerebral white matter and pons which are nonspecific, but likely reflect early changes of chronic small vessel ischemic disease. Chronic infarct within the paramedian left pons. Cerebral volume is normal for age. Vascular: Flow voids maintained within the proximal large arterial vessels. Skull and upper cervical spine: No focal marrow lesion.  Sinuses/Orbits: Visualized orbits demonstrate no acute abnormality. Minimal ethmoid sinus mucosal thickening. Right mastoid effusion. IMPRESSION: 1. No evidence of acute intracranial abnormality, including acute infarction. 2. Chronic infarct within the left paramedian pons. 3. Additional mild stable T2 hyperintense signal changes within the pons and cerebral white matter which are nonspecific, but likely reflect early changes of chronic small vessel ischemic disease. 4. Minimal ethmoid sinus mucosal thickening. Right mastoid effusion. Electronically Signed   By: [name] DO   On: 08/04/2019 18:26       Domenic Moras, PA-C 08/04/19 1846    Quintella Reichert, MD  08/05/19 1841  

## 2019-08-04 NOTE — ED Triage Notes (Signed)
Pt arrives [facility] from home with sudden onset dizziness, emesis and nausea at 1200 today. Hx CVA in January with residual balance issues. Pt given 4mg  zofran en route with little improvement. 207/120, HR 90, 98% RA, RR 18. Pt reports her stroke in January with same symptoms. Stroke scale neg with ems.

## 2019-08-04 NOTE — Consult Note (Addendum)
Neurology Consultation  Reason for Consult: Nausea vomiting and dizziness possible stroke Referring Physician: Antibody  CC: Nausea vomiting dizziness  History is obtained from: Patient  HPI: Rhonda [name] is a 59 y.o. female with history of prediabetes, hypertension, degenerative disc disease and punctate infarct 3 months ago.  Patient was in triage with nausea vomiting and dizziness.  Neurology was asked to see patient in triage however code stroke was not called secondary to patient recently having stroke.  Neurology was asked to consult for possible stroke.  Patient states that at approximately 12 PM today she had a sudden onset of nausea vomiting and dizzy-like sensation.  She did get up to walk but felt as though she was very unsteady.  Due to the fact that in January her stroke at the very same symptoms patient came to the emergency department to be evaluated.  Currently she is in bed, she does not have any nausea but has been given Zofran.  She states that as long she stays still she is fine however she gets up and moves she feels nauseous and unsteady on her feet.  ED course  Relevant labs include -sodium 133, potassium 3.2, glucose 126, AST 94, ALT 65  Chart review-patient was seen on 05/23/2019 at that time MRI revealed positive for acute looking type infarct in the left paracentral pons.  CTA of head and neck showed mild atherosclerosis in the head and neck without large vessel occlusion.  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  At that time patient's A1c was 6.1, LDL 79, echo with EF of 60 to 65%.  Left ventricular with normal function.  LKW: 12 PM on 08/04/2019 tpa given?: no, recent stroke with NIH stroke scale of 0 Premorbid modified Rankin scale (mRS): 1 due to the fact that she is still in rehab for slight unsteady gait from previous stroke NIH stroke scale of 0    Past Medical History:  Diagnosis Date  . Arthritis   . Degenerative disc disease, lumbar   .  Hypertension   . Prediabetes     Family History  Problem Relation Age of Onset  . Cancer Mother   . Hypertension Sister   . Diabetes Sister   . Diabetes Maternal Grandmother    Social History:   reports that she has never smoked. She has never used smokeless tobacco. She reports that she does not drink alcohol or use drugs.  Medications  Current Facility-Administered Medications:  .  sodium chloride flush (NS) 0.9 % injection 3 mL, 3 mL, Intravenous, Once, Nanavati, Ankit, MD  Current Outpatient Medications:  .  acetaminophen (TYLENOL) 650 MG CR tablet, Take 1,300 mg by mouth daily as needed (arthritis pain)., Disp: , Rfl:  .  APPLE CIDER VINEGAR PO, Take 2 capsules by mouth at bedtime., Disp: , Rfl:  .  aspirin EC 81 MG tablet, Take 1 tablet (81 mg total) by mouth daily., Disp: 30 tablet, Rfl: 11 .  atorvastatin (LIPITOR) 80 MG tablet, Take 1 tablet (80 mg total) by mouth daily at 6 PM., Disp: 30 tablet, Rfl: 2 .  Cyanocobalamin (VITAMIN B-12 PO), Take 1 tablet by mouth daily. , Disp: , Rfl:  .  gabapentin (NEURONTIN) 300 MG capsule, TAKE 2 CAPSULES BY MOUTH 3 (THREE) TIMES DAILY. (Patient taking differently: Take 300 mg by mouth 3 (three) times daily. ), Disp: 180 capsule, Rfl: 3 .  glucosamine-chondroitin 500-400 MG tablet, Take 1 tablet by mouth 3 (three) times daily. (Patient  taking differently: Take 2 tablets by mouth daily. ), Disp: 90 tablet, Rfl: 2 .  lisinopril-hydrochlorothiazide (ZESTORETIC) 10-12.5 MG tablet, Take 1 tablet by mouth every evening. , Disp: , Rfl:  .  Multiple Vitamin (MULTIVITAMIN WITH MINERALS) TABS tablet, Take 1 tablet by mouth daily., Disp: , Rfl:  .  Omega-3 Fatty Acids (FISH OIL) 1000 MG CAPS, Take 1,000 mg by mouth at bedtime., Disp: , Rfl:  .  pantoprazole (PROTONIX) 40 MG tablet, Take 40 mg by mouth 2 (two) times daily., Disp: , Rfl:  .  clopidogrel (PLAVIX) 75 MG tablet, Take 1 tablet (75 mg total) by mouth daily., Disp: 21 tablet, Rfl: 0 .   hydrochlorothiazide (HYDRODIURIL) 12.5 MG tablet, Take 12.5 mg by mouth daily., Disp: , Rfl:  .  lisinopril (ZESTRIL) 10 MG tablet, Take 10 mg by mouth daily., Disp: , Rfl:  .  PRESCRIPTION MEDICATION, Inhale into the lungs at bedtime. CPAP, Disp: , Rfl:   ROS:    General ROS: negative for - chills, fatigue, fever, night sweats, weight gain or weight loss Psychological ROS: negative for - behavioral disorder, hallucinations, memory difficulties, mood swings or suicidal ideation Ophthalmic ROS: negative for - blurry vision, double vision, eye pain or loss of vision ENT ROS: negative for - epistaxis, nasal discharge, oral lesions, sore throat, tinnitus or vertigo Allergy and Immunology ROS: negative for - hives or itchy/watery eyes Hematological and Lymphatic ROS: negative for - bleeding problems, bruising or swollen lymph nodes Endocrine ROS: negative for - galactorrhea, hair pattern changes, polydipsia/polyuria or temperature intolerance Respiratory ROS: negative for - cough, hemoptysis, shortness of breath or wheezing Cardiovascular ROS: negative for - chest pain, dyspnea on exertion, edema or irregular heartbeat Gastrointestinal ROS: Positive for -  nausea/vomiting  Genito-Urinary ROS: negative for - dysuria, hematuria, incontinence or urinary frequency/urgency Musculoskeletal ROS: negative for - joint swelling or muscular weakness Neurological ROS: as noted in HPI Dermatological ROS: negative for rash and skin lesion changes  Exam: Current vital signs: BP 127/88   Pulse 87   Temp 98.1 F (36.7 C) (Oral)   Resp (!) 25   SpO2 100%  Vital signs in last 24 hours: Temp:  [98.1 F (36.7 C)] 98.1 F (36.7 C) (03/18 1312) Pulse Rate:  [74-87] 87 (03/18 1400) Resp:  [20-25] 25 (03/18 1430) BP: (115-189)/(84-111) 127/88 (03/18 1430) SpO2:  [100 %] 100 % (03/18 1400)   Constitutional: Appears well-developed and well-nourished.  Eyes: No scleral injection HENT: No OP obstrucion Head:  Normocephalic.  Cardiovascular: Normal rate and regular rhythm.  Respiratory: Effort normal, non-labored breathing GI: Soft.  No distension. There is no tenderness.  Skin: WDI  Neuro: Mental Status: Patient is awake, alert, oriented to person, place, month, year, and situation. Speech- naming,  repeating  comprehension Patient is able to give a clear and coherent history. Cranial Nerves: II: Visual Fields are full.  III,IV, VI: EOMI without ptosis or diploplia. Pupils show 1 mm difference, round and reactive to light V: Facial sensation is symmetric to temperature VII: Facial movement is symmetric.  VIII: hearing is intact to voice X: Palat elevates symmetrically XI: Shoulder shrug is symmetric. XII: tongue is midline without atrophy or fasciculations.  Motor: Tone is normal. Bulk is normal. 5/5 strength was present in all four extremities.  No drift No aterixis Sensory: Sensation is symmetric to light touch and temperature in the arms and legs. DSS Deep Tendon Reflexes: 2+ and symmetric in the biceps and patellae.  Plantars: Toes are downgoing bilaterally.  Cerebellar: FNF and HKS are intact bilaterally  Labs I have reviewed labs in epic and the results pertinent to this consultation are:  CBC    Component Value Date/Time   WBC 12.4 (H) 08/04/2019 1332   RBC 4.68 08/04/2019 1332   HGB 13.9 08/04/2019 1408   HCT 41.0 08/04/2019 1408   PLT 389 08/04/2019 1332   MCV 85.0 08/04/2019 1332   MCH 27.8 08/04/2019 1332   MCHC 32.7 08/04/2019 1332   RDW 16.5 (H) 08/04/2019 1332   LYMPHSABS 4.0 08/04/2019 1332   MONOABS 1.0 08/04/2019 1332   EOSABS 0.1 08/04/2019 1332   BASOSABS 0.0 08/04/2019 1332    CMP     Component Value Date/Time   NA 131 (L) 08/04/2019 1408   NA 142 02/10/2018 1639   K 5.0 08/04/2019 1408   CL 98 08/04/2019 1408   CO2 21 (L) 08/04/2019 1332   GLUCOSE 123 (H) 08/04/2019 1408   BUN 10 08/04/2019 1408   BUN 18 02/10/2018 1639   CREATININE  0.60 08/04/2019 1408   CREATININE 0.85 08/02/2015 1710   CALCIUM 9.2 08/04/2019 1332   PROT 7.9 08/04/2019 1332   PROT 6.9 02/10/2018 1639   ALBUMIN 3.5 08/04/2019 1332   ALBUMIN 4.0 02/10/2018 1639   AST 94 (H) 08/04/2019 1332   ALT 65 (H) 08/04/2019 1332   ALKPHOS 64 08/04/2019 1332   BILITOT 0.7 08/04/2019 1332   BILITOT <0.2 02/10/2018 1639   GFRNONAA >60 08/04/2019 1332   GFRNONAA 78 08/02/2015 1710   GFRAA >60 08/04/2019 1332   GFRAA >89 08/02/2015 1710    Lipid Panel     Component Value Date/Time   CHOL 248 (H) 05/24/2019 0556   CHOL 213 (H) 07/07/2017 0932   TRIG 172 (H) 05/24/2019 0556   HDL 35 (L) 05/24/2019 0556   HDL 40 07/07/2017 0932   CHOLHDL 7.1 05/24/2019 0556   VLDL 34 05/24/2019 0556   LDLCALC 179 (H) 05/24/2019 0556   LDLCALC 137 (H) 07/07/2017 0932     Imaging I have reviewed the images obtained:  Etta Quill PA-C [facility] [phone]  M-F  (9:00 am- 5:00 PM)  08/04/2019, 2:52 PM   I have seen the patient and reviewed the above note.   Assessment:  This is a 59 year old female who previously suffered from a acute lacunar type infarct in the left paracentral pons in January.  Patient returns with similar symptoms of nausea vomiting and gait instability. I am concerned she may have had another small vessel infarct either in the pons or possible cerebellum vs recrudescence.  Patient has had a full stroke work-up in less than 3 months ago, and therefore would not likely need to repeat it.  Impression: -Possible stroke  Recommendations: -MRI brain -If positive will likely need admission for therapy re-evaluation.   Roland Rack, MD [facility] [phone]  If 7pm- 7am, please page neurology on call as listed in [place].

## 2019-09-12 ENCOUNTER — Ambulatory Visit: Payer: BLUE CROSS/BLUE SHIELD | Admitting: Physical Therapy

## 2019-09-19 ENCOUNTER — Ambulatory Visit: Payer: BLUE CROSS/BLUE SHIELD | Admitting: Physical Therapy

## 2019-11-13 ENCOUNTER — Inpatient Hospital Stay (HOSPITAL_COMMUNITY): Payer: BLUE CROSS/BLUE SHIELD

## 2019-11-13 ENCOUNTER — Other Ambulatory Visit: Payer: Self-pay

## 2019-11-13 ENCOUNTER — Emergency Department (HOSPITAL_COMMUNITY): Payer: BLUE CROSS/BLUE SHIELD

## 2019-11-13 ENCOUNTER — Encounter (HOSPITAL_COMMUNITY): Payer: Self-pay

## 2019-11-13 ENCOUNTER — Inpatient Hospital Stay (HOSPITAL_COMMUNITY)
Admission: EM | Admit: 2019-11-13 | Discharge: 2019-11-28 | DRG: 059 | Disposition: A | Discharge status: Rehabilitation Facility | Payer: BLUE CROSS/BLUE SHIELD | Attending: Family Medicine | Admitting: Family Medicine

## 2019-11-13 DIAGNOSIS — Z79899 Other long term (current) drug therapy: Secondary | ICD-10-CM

## 2019-11-13 DIAGNOSIS — E785 Hyperlipidemia, unspecified: Secondary | ICD-10-CM | POA: Diagnosis present

## 2019-11-13 DIAGNOSIS — G378 Other specified demyelinating diseases of central nervous system: Secondary | ICD-10-CM | POA: Diagnosis present

## 2019-11-13 DIAGNOSIS — E875 Hyperkalemia: Secondary | ICD-10-CM | POA: Diagnosis present

## 2019-11-13 DIAGNOSIS — G8929 Other chronic pain: Secondary | ICD-10-CM | POA: Diagnosis present

## 2019-11-13 DIAGNOSIS — B370 Candidal stomatitis: Secondary | ICD-10-CM | POA: Diagnosis present

## 2019-11-13 DIAGNOSIS — E559 Vitamin D deficiency, unspecified: Secondary | ICD-10-CM | POA: Diagnosis present

## 2019-11-13 DIAGNOSIS — R4701 Aphasia: Secondary | ICD-10-CM | POA: Diagnosis present

## 2019-11-13 DIAGNOSIS — G9341 Metabolic encephalopathy: Secondary | ICD-10-CM | POA: Diagnosis not present

## 2019-11-13 DIAGNOSIS — G9349 Other encephalopathy: Secondary | ICD-10-CM | POA: Diagnosis present

## 2019-11-13 DIAGNOSIS — Z833 Family history of diabetes mellitus: Secondary | ICD-10-CM

## 2019-11-13 DIAGNOSIS — D8689 Sarcoidosis of other sites: Secondary | ICD-10-CM | POA: Diagnosis not present

## 2019-11-13 DIAGNOSIS — R05 Cough: Secondary | ICD-10-CM | POA: Diagnosis present

## 2019-11-13 DIAGNOSIS — Z993 Dependence on wheelchair: Secondary | ICD-10-CM

## 2019-11-13 DIAGNOSIS — G379 Demyelinating disease of central nervous system, unspecified: Secondary | ICD-10-CM | POA: Diagnosis not present

## 2019-11-13 DIAGNOSIS — Z8673 Personal history of transient ischemic attack (TIA), and cerebral infarction without residual deficits: Secondary | ICD-10-CM | POA: Diagnosis not present

## 2019-11-13 DIAGNOSIS — I6389 Other cerebral infarction: Secondary | ICD-10-CM | POA: Diagnosis not present

## 2019-11-13 DIAGNOSIS — D8682 Multiple cranial nerve palsies in sarcoidosis: Secondary | ICD-10-CM | POA: Diagnosis present

## 2019-11-13 DIAGNOSIS — G4733 Obstructive sleep apnea (adult) (pediatric): Secondary | ICD-10-CM | POA: Diagnosis present

## 2019-11-13 DIAGNOSIS — I10 Essential (primary) hypertension: Secondary | ICD-10-CM | POA: Diagnosis present

## 2019-11-13 DIAGNOSIS — M5136 Other intervertebral disc degeneration, lumbar region: Secondary | ICD-10-CM | POA: Diagnosis present

## 2019-11-13 DIAGNOSIS — R7989 Other specified abnormal findings of blood chemistry: Secondary | ICD-10-CM | POA: Diagnosis present

## 2019-11-13 DIAGNOSIS — R7303 Prediabetes: Secondary | ICD-10-CM | POA: Diagnosis present

## 2019-11-13 DIAGNOSIS — E871 Hypo-osmolality and hyponatremia: Secondary | ICD-10-CM | POA: Diagnosis present

## 2019-11-13 DIAGNOSIS — K219 Gastro-esophageal reflux disease without esophagitis: Secondary | ICD-10-CM | POA: Diagnosis present

## 2019-11-13 DIAGNOSIS — R269 Unspecified abnormalities of gait and mobility: Secondary | ICD-10-CM | POA: Diagnosis not present

## 2019-11-13 DIAGNOSIS — T380X5A Adverse effect of glucocorticoids and synthetic analogues, initial encounter: Secondary | ICD-10-CM | POA: Diagnosis present

## 2019-11-13 DIAGNOSIS — M5441 Lumbago with sciatica, right side: Secondary | ICD-10-CM | POA: Diagnosis not present

## 2019-11-13 DIAGNOSIS — Z8616 Personal history of COVID-19: Secondary | ICD-10-CM

## 2019-11-13 DIAGNOSIS — B1920 Unspecified viral hepatitis C without hepatic coma: Secondary | ICD-10-CM | POA: Diagnosis present

## 2019-11-13 DIAGNOSIS — Z8249 Family history of ischemic heart disease and other diseases of the circulatory system: Secondary | ICD-10-CM

## 2019-11-13 DIAGNOSIS — Z7902 Long term (current) use of antithrombotics/antiplatelets: Secondary | ICD-10-CM | POA: Diagnosis not present

## 2019-11-13 DIAGNOSIS — Z7982 Long term (current) use of aspirin: Secondary | ICD-10-CM

## 2019-11-13 DIAGNOSIS — M5442 Lumbago with sciatica, left side: Secondary | ICD-10-CM | POA: Diagnosis not present

## 2019-11-13 DIAGNOSIS — R531 Weakness: Secondary | ICD-10-CM

## 2019-11-13 DIAGNOSIS — R29898 Other symptoms and signs involving the musculoskeletal system: Secondary | ICD-10-CM

## 2019-11-13 DIAGNOSIS — G459 Transient cerebral ischemic attack, unspecified: Secondary | ICD-10-CM

## 2019-11-13 LAB — DIFFERENTIAL
Abs Immature Granulocytes: 0.12 10*3/uL — ABNORMAL HIGH (ref 0.00–0.07)
Basophils Absolute: 0 10*3/uL (ref 0.0–0.1)
Basophils Relative: 0 %
Eosinophils Absolute: 0 10*3/uL (ref 0.0–0.5)
Eosinophils Relative: 0 %
Immature Granulocytes: 1 %
Lymphocytes Relative: 10 %
Lymphs Abs: 1.2 10*3/uL (ref 0.7–4.0)
Monocytes Absolute: 0.9 10*3/uL (ref 0.1–1.0)
Monocytes Relative: 8 %
Neutro Abs: 9.7 10*3/uL — ABNORMAL HIGH (ref 1.7–7.7)
Neutrophils Relative %: 81 %

## 2019-11-13 LAB — CBC
HCT: 41 % (ref 36.0–46.0)
Hemoglobin: 12.5 g/dL (ref 12.0–15.0)
MCH: 26.7 pg (ref 26.0–34.0)
MCHC: 30.5 g/dL (ref 30.0–36.0)
MCV: 87.4 fL (ref 80.0–100.0)
Platelets: 347 10*3/uL (ref 150–400)
RBC: 4.69 MIL/uL (ref 3.87–5.11)
RDW: 16.1 % — ABNORMAL HIGH (ref 11.5–15.5)
WBC: 12 10*3/uL — ABNORMAL HIGH (ref 4.0–10.5)
nRBC: 0 % (ref 0.0–0.2)

## 2019-11-13 LAB — COMPREHENSIVE METABOLIC PANEL
ALT: 54 U/L — ABNORMAL HIGH (ref 0–44)
AST: 87 U/L — ABNORMAL HIGH (ref 15–41)
Albumin: 3.1 g/dL — ABNORMAL LOW (ref 3.5–5.0)
Alkaline Phosphatase: 60 U/L (ref 38–126)
Anion gap: 11 (ref 5–15)
BUN: 12 mg/dL (ref 6–20)
CO2: 28 mmol/L (ref 22–32)
Calcium: 9.5 mg/dL (ref 8.9–10.3)
Chloride: 93 mmol/L — ABNORMAL LOW (ref 98–111)
Creatinine, Ser: 0.95 mg/dL (ref 0.44–1.00)
GFR calc Af Amer: >60 mL/min (ref >60)
GFR calc non Af Amer: >60 mL/min (ref >60)
Glucose, Bld: 100 mg/dL — ABNORMAL HIGH (ref 70–99)
Potassium: 3.9 mmol/L (ref 3.5–5.1)
Sodium: 132 mmol/L — ABNORMAL LOW (ref 135–145)
Total Bilirubin: 0.5 mg/dL (ref 0.3–1.2)
Total Protein: 7.1 g/dL (ref 6.5–8.1)

## 2019-11-13 LAB — I-STAT CHEM 8, ED
BUN: 15 mg/dL (ref 6–20)
Calcium, Ion: 1.04 mmol/L — ABNORMAL LOW (ref 1.15–1.40)
Chloride: 93 mmol/L — ABNORMAL LOW (ref 98–111)
Creatinine, Ser: 1 mg/dL (ref 0.44–1.00)
Glucose, Bld: 99 mg/dL (ref 70–99)
HCT: 41 % (ref 36.0–46.0)
Hemoglobin: 13.9 g/dL (ref 12.0–15.0)
Potassium: 4.1 mmol/L (ref 3.5–5.1)
Sodium: 133 mmol/L — ABNORMAL LOW (ref 135–145)
TCO2: 33 mmol/L — ABNORMAL HIGH (ref 22–32)

## 2019-11-13 LAB — CBG MONITORING, ED: Glucose-Capillary: 97 mg/dL (ref 70–99)

## 2019-11-13 LAB — PROTIME-INR
INR: 1 (ref 0.8–1.2)
Prothrombin Time: 12.5 seconds (ref 11.4–15.2)

## 2019-11-13 LAB — APTT: aPTT: 29 seconds (ref 24–36)

## 2019-11-13 MED ORDER — SENNOSIDES-DOCUSATE SODIUM 8.6-50 MG PO TABS: 1.0000 | ORAL_TABLET | Freq: Every evening | ORAL | Status: DC | PRN

## 2019-11-13 MED ORDER — ONDANSETRON HCL 4 MG/2ML IJ SOLN: 4.0000 mg | Freq: Four times a day (QID) | INTRAMUSCULAR | Status: DC | PRN

## 2019-11-13 MED ORDER — SODIUM CHLORIDE 0.9% FLUSH
3.0000 mL | Freq: Two times a day (BID) | INTRAVENOUS | Status: DC
  Administered 2019-11-14 – 2019-11-28 (×26): 3 mL via INTRAVENOUS

## 2019-11-13 MED ORDER — ONDANSETRON HCL 4 MG PO TABS: 4.0000 mg | ORAL_TABLET | Freq: Four times a day (QID) | ORAL | Status: DC | PRN

## 2019-11-13 MED ORDER — ACETAMINOPHEN 650 MG RE SUPP: 650.0000 mg | Freq: Four times a day (QID) | RECTAL | Status: DC | PRN

## 2019-11-13 MED ORDER — ACETAMINOPHEN 325 MG PO TABS: 650.0000 mg | ORAL_TABLET | Freq: Four times a day (QID) | ORAL | Status: DC | PRN

## 2019-11-13 MED ORDER — ALBUTEROL SULFATE (2.5 MG/3ML) 0.083% IN NEBU: 2.5000 mg | INHALATION_SOLUTION | RESPIRATORY_TRACT | Status: DC | PRN

## 2019-11-13 MED ORDER — SODIUM CHLORIDE 0.9 % IV SOLN: INTRAVENOUS | Status: DC

## 2019-11-13 MED ORDER — HEPARIN SODIUM (PORCINE) 5000 UNIT/ML IJ SOLN: 5000.0000 [IU] | Freq: Three times a day (TID) | INTRAMUSCULAR | Status: DC

## 2019-11-13 MED ORDER — SODIUM CHLORIDE 0.9% FLUSH
3.0000 mL | Freq: Once | INTRAVENOUS | Status: DC
Start: 2019-11-13 — End: 2019-11-28

## 2019-11-13 NOTE — ED Notes (Signed)
Gave pt sandwich and water 

## 2019-11-13 NOTE — Consult Note (Addendum)
NEURO HOSPITALIST  CONSULT   Requesting Physician: Dr. Sabra Heck    Chief Complaint: AMS  History obtained from:  Patient    HPI:                                                                                                                                         Rhonda [name] is an 59 y.o. female PMH CVA (05/2019), HTN who presented to [facility] ED as a code stroke for AMS s/p fall.  Per EMS patient fell this morning about 0800 and hit her head. no LOC.She refused EMS care at that time.  Family says she was completely normal and went to church.71 family became concerned because patient was seeing things that were not there, argumentative, and talking funny ( stuttering).  Patient does not remember the event. Patient uses wheelchair for mobilization.  ED course:  CT H: did not show a hemorrhage BP: 132/100 BG: 101  tPA Given: no;no focal symptoms Modified Rankin: Rankin Score=3 NIHSS:7     Past Medical History:  Diagnosis Date  . Arthritis   . Degenerative disc disease, lumbar   . Hypertension   . Prediabetes     Past Surgical History:  Procedure Laterality Date  . ABDOMINAL HYSTERECTOMY    . BREAST EXCISIONAL BIOPSY Right   . BREAST SURGERY     right breast  . CESAREAN SECTION      Family History  Problem Relation Age of Onset  . Cancer Mother   . Hypertension Sister   . Diabetes Sister   . Diabetes Maternal Grandmother      Social History:  reports that she has never smoked. She has never used smokeless tobacco. She reports that she does not drink alcohol and does not use drugs.  Allergies:  Allergies  Allergen Reactions  . Aspirin Nausea And Vomiting  . Augmentin [Amoxicillin-Pot Clavulanate] Swelling    Hand swelled at IV site    Medications:  Current Facility-Administered Medications   Medication Dose Route Frequency Provider Last Rate Last Admin  . sodium chloride flush (NS) 0.9 % injection 3 mL  3 mL Intravenous Once Noemi Chapel, MD       Current Outpatient Medications  Medication Sig Dispense Refill  . acetaminophen (TYLENOL) 650 MG CR tablet Take 1,300 mg by mouth daily as needed (arthritis pain).    . APPLE CIDER VINEGAR PO Take 2 capsules by mouth at bedtime.    Marland Kitchen aspirin EC 81 MG tablet Take 1 tablet (81 mg total) by mouth daily. 30 tablet 11  . atorvastatin (LIPITOR) 80 MG tablet Take 1 tablet (80 mg total) by mouth daily at 6 PM. 30 tablet 2  . clopidogrel (PLAVIX) 75 MG tablet Take 1 tablet (75 mg total) by mouth daily. (Patient not taking: Reported on 08/04/2019) 21 tablet 0  . Cyanocobalamin (VITAMIN B-12 PO) Take 1 tablet by mouth daily.     . diclofenac Sodium (VOLTAREN) 1 % GEL Apply 2 g topically 2 (two) times daily as needed (pain).    Marland Kitchen gabapentin (NEURONTIN) 300 MG capsule TAKE 2 CAPSULES BY MOUTH 3 (THREE) TIMES DAILY. (Patient taking differently: Take 300 mg by mouth 3 (three) times daily. ) 180 capsule 3  . glucosamine-chondroitin 500-400 MG tablet Take 1 tablet by mouth 3 (three) times daily. (Patient taking differently: Take 2 tablets by mouth daily. ) 90 tablet 2  . lisinopril-hydrochlorothiazide (ZESTORETIC) 10-12.5 MG tablet Take 1 tablet by mouth every evening.     . meclizine (ANTIVERT) 25 MG tablet Take 1 tablet (25 mg total) by mouth 3 (three) times daily as needed for dizziness. 30 tablet 0  . Multiple Vitamin (MULTIVITAMIN WITH MINERALS) TABS tablet Take 1 tablet by mouth daily.    . Omega-3 Fatty Acids (FISH OIL) 1000 MG CAPS Take 1,000 mg by mouth at bedtime.    . pantoprazole (PROTONIX) 40 MG tablet Take 40 mg by mouth 2 (two) times daily.    Marland Kitchen PRESCRIPTION MEDICATION Inhale into the lungs at bedtime. CPAP      ROS:                                                                                                                                        ROS was performed and is negative except as noted in HPI    General Examination:  There were no vitals taken for this visit.  Physical Exam  Constitutional: Appears well-developed and well-nourished.  Psych: Affect appropriate to situation Eyes: Normal external eye and conjunctiva. HENT: Normocephalic, no lesions, without obvious abnormality.   Musculoskeletal-no joint tenderness, deformity or swelling Cardiovascular: Normal rate and regular rhythm.  Respiratory: Effort normal, non-labored breathing saturations WNL GI: Soft.  No distension. There is no tenderness.  Skin: WDI  Neurological Examination Mental Status: Alert, oriented, thought content appropriate.  Slight dysarthria. Speech fluent without evidence of aphasia.  Able to follow  commands without difficulty. Cranial Nerves: II:  Visual fields grossly normal,  III,IV, VI: ptosis not present, extra-ocular motions intact bilaterally, pupils equal, round, reactive to light and accommodation V,VII: smile symmetric, facial light touch sensation normal bilaterally VIII: hearing normal bilaterally IX,X: uvula rises midline XI: bilateral shoulder shrug XII: midline tongue extension Motor: Right : Upper extremity   5/5  Left:     Upper extremity   5/5  Lower extremity   2/5   Lower extremity   2/5 Tone and bulk:normal tone throughout; no atrophy noted Sensory:  light touch intact throughout, bilaterally Cerebellar: No ataxia Gait: deferred   Lab Results: Basic Metabolic Panel: Recent Labs  Lab 11/13/19 1629  NA 133*  K 4.1  CL 93*  GLUCOSE 99  BUN 15  CREATININE 1.00    CBC: Recent Labs  Lab 11/13/19 1620 11/13/19 1629  WBC 12.0*  --   NEUTROABS 9.7*  --   HGB 12.5 13.9  HCT 41.0 41.0  MCV 87.4  --   PLT 347  --    CBG: Recent Labs  Lab 11/13/19 1620  GLUCAP 97    Imaging: CT HEAD CODE  STROKE WO CONTRAST  Result Date: 11/13/2019 CLINICAL DATA:  Code stroke.  Altered mental status EXAM: CT HEAD WITHOUT CONTRAST TECHNIQUE: Contiguous axial images were obtained from the base of the skull through the vertex without intravenous contrast. COMPARISON:  08/04/2019 FINDINGS: Brain: There is no acute intracranial hemorrhage, mass effect, or edema. No new loss of gray-white differentiation. Ventricles and sulci are normal in size and configuration. Patchy hypoattenuation in the supratentorial white matter is nonspecific but probably reflects stable chronic microvascular ischemic changes. 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 No extra-axial fluid collection. Vascular: No hyperdense vessel. Mild intracranial atherosclerotic calcification is present at the skull base. Skull: Unremarkable Sinuses/Orbits: Aerated.  Orbits are unremarkable. Other: Opacification of the inferior right mastoid air cells. ASPECTS ([place] Stroke Program Early CT Score) - Ganglionic level infarction (caudate, lentiform nuclei, internal capsule, insula, M1-M3 cortex): 7 - Supraganglionic infarction (M4-M6 cortex): 3 Total score (0-10 with 10 being normal): 10 IMPRESSION: No acute intracranial hemorrhage or evidence of acute infarction. ASPECT score is 10. Stable chronic findings detailed above. These results were communicated to Dr. Laurence Slate at 4:34 pmon 6/27/2021by text page via the [facility] messaging system. Electronically Signed   By: Guadlupe Spanish M.D.   On: 11/13/2019 16:36       Valentina Lucks, MSN, NP-C [facility] [phone]  11/13/2019, 4:46 PM   Attending physician note to follow with Assessment and plan .   Assessment: 59 y.o. female PMH CVA (05/2019), HTN who presented to [facility] ED as a code stroke for AMS s/p fall. CTH did not show a hemorrhage. Patient exam is non focal. Will obtain MRI. Stroke Risk Factors - hypertension    Recommendations: --MRI Brain  --Telemetry  monitoring --Frequent neuro checks  --Please page  the Stroke team from 8am-4pm.   You can look them up on [email or web address]    NEUROHOSPITALIST ADDENDUM Performed a face to face diagnostic evaluation.   I have reviewed the contents of history and physical exam as documented by PA/ARNP/Resident and agree with above documentation.  I have discussed and formulated the above plan as documented. Edits to the note have been made as needed.  59 year old female with past medical history of stroke presented to the emergency department after having a mechanical fall, was fine afterwards but later at 1:40 PM patient was starting to act funny and starting her words.  Patient does not remember events.  At baseline she is wheelchair-bound due to severe arthritis . On examination, she had a nonfocal exam with no cranial nerve deficits.  Visual fields intact and no facial droop.  No arm drift in bilateral upper extremities.  Limited motion in bilateral lower extremities due to significant arthritis, plantar flexion release 4/ 5 strength bilaterally.  Stat CT head was negative for acute findings.  MRI brain to rule out acute stroke. Recommend metabolic work-up.   Impression Acute encephalopathy, postconcussive syndrome Possible mild stroke  Neurology will follow-up MRI  Addendum  MRI brain in the brainstem, cerebellum, upper cervical spinal cord.  Differential includes neurosarcoid versus acute demyelinating disease. Needs admission for further work-up  Demyelinating disease versus neurosarcoid Acute on chronic encephalopathy Bilateral lower extremity weakness   Recommendations -Admit to medicine -MRI brain with contrast and MRI C-spine with and without contrast -Lumbar puncture to evaluate for CSF protein, glucose, cell count, IgG index, cultures, Gram stain, CSF ACE level, CSF VDRL, consider paraneoplastic panel, additional sample physical hold for testing if needed -Obtain autoimmune work-up  including SSA, SSB, ANA, ANCA, RF, ESR, CRP, ant NMO antibodies, anti MOG ( not ordered as it is a send out, but consider if above negative) -HIV, hepatitis C -Serum ACE, chest x-ray  Will wait for MRI before starting high dose steroids.   Neurology will continue to follow     Rhonda Addison Scotlynn Noyes MD [facility] [phone]   If 7pm to 7am, please call on call as listed on AMION.

## 2019-11-13 NOTE — ED Notes (Addendum)
Spoke with Dr. Lorraine Lax about this pt. Per Dr. Lorraine Lax cancel subcutaneous heparin. Neuro is planning on performing an LP tomorrow.

## 2019-11-13 NOTE — ED Triage Notes (Signed)
Pt BIB [facility] for code stroke. Pt had a mechanical fall this morning. Family called EMS out to check on pt, pt refused to be treated. Pt went to church and was LKW at 1340 this afternoon. Family noticed pt began talking funny and became argumentative. When EMS arrived they felt pt had difficulty getting words out and stuttering.

## 2019-11-13 NOTE — ED Notes (Signed)
Pt transported to MRI 

## 2019-11-13 NOTE — H&P (Addendum)
History and Physical    Rhonda [name] PWO:[id number] DOB: 03/31/1961 DOA: 11/13/2019  PCP: Verlon Au, MD  Patient coming from: home I have personally briefly reviewed patient's old medical records in [facility]  Chief Complaint: agitation altered mental status s/p Fall this am  HPI: Rhonda [name] is a 59 y.o. 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 Patient presents to ed BIB EMS for concern for possible stroke due to agitation altered mental status s/p Fall this am. Patient husband states that last evening patient has episode of recurrent nausea and vomiting but this eventually resolved. Patient this am stated that she had an attack of vertigo and fell and hit her head s/p which she was evaluated by ems and at that time due to feeling improved deferred further evaluation. Per husband patient felt well enough to attend church. Husband states the outing was uneventful, however on arriving home patient states that she felt very sleepy. After which he states he was noted to be confused and belligerent. Per husband she had no slurred speech, no facial droop, no noted focal weakness from baseline. Due to this ems was again called to the residence and at that time patient was transported to ED. Patient states she has no recollections of this event. Per husband she is back to her baseline currently. Patient on further ros notes fatigue, vertigo, postural tachycardic events, no syncope, muscle weakness, increase ambulatory difficulty s/p recovery from COVID. She currently denies any fever or chills, but notes cold intolerance, she denies dysuria, sob, chest pain, cough, or current decrease appetite.  ED Course:  Vitals: Temp 99.4, bp 114/81, hr 94, rr21 sat 995 on ra Labs Wbc:12,hgb 12.5 NA132 ALT/AST: 54/87 Cr 0.95  CT  head: IMPRESSION: No acute intracranial hemorrhage or evidence of acute infarction. ASPECT score is 10.   CT cervical spine  IMPRESSION: 1. No fracture or acute finding  Ekg: nsr biatrial enlarge ,? Ant st changes  Repeat pending /ce pending   MRI Brain IMPRESSION: 1. New white matter disease in the cerebrum, brainstem and visualized upper cervical spinal cord, thickening of the pituitary stalk. The primary differential considerations are neurosarcoidosis and acute demyelinating disease. 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   Please note patient initially presented as CODE stroke but base on history and symptoms this was cancelled. However patient was evaluated by neurology in ED.  Final recs as follows:  -MRI brain with contrast and MRI C-spine with and without contrast -Lumbar puncture to evaluate for CSF protein, glucose, cell count, IgG index, cultures, Gram stain, CSF ACE level, CSF VDRL, consider paraneoplastic panel, additional sample physical hold for testing if needed -Obtain autoimmune work-up including SSA, SSB, ANA, ANCA, RF, ESR, CRP, ant NMO antibodies, anti MOG ( not ordered as it is a send out, but consider if above negative) -HIV, hepatitis C -Serum ACE, chest x-ray  Will wait for MRI before starting high dose steroids.   Review of Systems: As per HPI otherwise 10 point review of systems negative.   Past Medical History:  Diagnosis Date  . Arthritis   . Degenerative disc disease, lumbar   . Hypertension   . Prediabetes     Past Surgical History:  Procedure Laterality Date  . ABDOMINAL HYSTERECTOMY    . BREAST EXCISIONAL BIOPSY Right   . BREAST SURGERY     right breast  . CESAREAN SECTION       reports that she has never smoked. She has never used smokeless tobacco. She reports that she does not drink alcohol and  does not use drugs.  Allergies  Allergen Reactions  . Aspirin Nausea And Vomiting  . Augmentin [Amoxicillin-Pot Clavulanate] Swelling and Other (See Comments)    Hand swelled at IV site    Family History  Problem Relation Age of Onset  . Cancer Mother   . Hypertension Sister   . Diabetes Sister   . Diabetes Maternal Grandmother     Prior to Admission medications   Medication Sig Start Date End Date Taking? Authorizing Provider  acetaminophen (TYLENOL) 650 MG CR tablet Take 1,300 mg by mouth 2 (two) times daily as needed (arthritis pain).    Yes [provider]  aspirin EC 81 MG tablet Take 1 tablet (81 mg total) by mouth daily. 05/24/19 05/23/20 Yes Dessa Phi, DO  atorvastatin (LIPITOR) 80 MG tablet Take 1 tablet (80 mg total) by mouth daily at 6 PM. Patient taking differently: Take 80 mg by mouth daily.  05/24/19 11/13/19 Yes Dessa Phi, DO  Cyanocobalamin (VITAMIN B-12 PO) Take 1 tablet by mouth daily.    Yes [provider]  gabapentin (NEURONTIN) 300 MG capsule TAKE 2 CAPSULES BY MOUTH 3 (THREE) TIMES DAILY. Patient taking differently: Take 300 mg by mouth 3 (three) times daily.  03/24/18  Yes Gildardo Pounds, NP  glucosamine-chondroitin 500-400 MG tablet Take 1 tablet by mouth 3 (three) times daily. Patient taking differently: Take 2 tablets by mouth daily.  04/11/15  Yes Funches, Adriana Mccallum, MD  lisinopril-hydrochlorothiazide (ZESTORETIC) 10-12.5 MG tablet Take 1 tablet by mouth every evening.  07/11/19  Yes [provider]  meclizine (ANTIVERT) 25 MG tablet Take 1 tablet (25 mg total) by mouth 3 (three) times daily as needed for dizziness. 08/04/19  Yes Domenic Moras, PA-C  Multiple Vitamin (MULTIVITAMIN WITH MINERALS) TABS tablet Take 1 tablet by mouth daily.   Yes [provider]  Omega-3 Fatty Acids (FISH OIL) 1000 MG CAPS Take 1,000 mg by mouth at bedtime.   Yes [provider]  pantoprazole (PROTONIX) 40 MG tablet Take 40 mg by mouth  3 (three) times daily before meals.  07/16/19  Yes [provider]  PRESCRIPTION MEDICATION Inhale into the lungs See admin instructions. CPAP- At bedtime   Yes [provider]  APPLE CIDER VINEGAR PO Take 2 capsules by mouth at bedtime.    [provider]  clopidogrel (PLAVIX) 75 MG tablet Take 1 tablet (75 mg total) by mouth daily. Patient not taking: Reported on 11/13/2019 05/25/19   Dessa Phi, DO  diclofenac Sodium (VOLTAREN) 1 % GEL Apply 2 g topically 2 (two) times daily as needed (pain). Patient not taking: Reported on 11/13/2019    [provider]    Physical Exam: Vitals:   11/13/19 1649 11/13/19 1717 11/13/19 1732 11/13/19 1745  BP: 114/81 126/88 117/76 117/79  Pulse: 94 93 93 93  Resp: (!) 21 (!) 27 (!) 27 (!) 26  Temp: 99.4 F (37.4 C)     TempSrc: Oral     SpO2: 99% 100% 99% 100%     Vitals:   11/13/19 1649 11/13/19 1717 11/13/19 1732 11/13/19 1745  BP: 114/81 126/88 117/76 117/79  Pulse: 94 93 93  93  Resp: (!) 21 (!) 27 (!) 27 (!) 26  Temp: 99.4 F (37.4 C)     TempSrc: Oral     SpO2: 99% 100% 99% 100%  Constitutional: NAD, calm, comfortable Eyes: PERRL, lids and conjunctivae normal ENMT: Mucous membranes are moist. Posterior pharynx clear of any exudate or lesions.Normal dentition.  Neck: normal, supple, no masses, no thyromegaly Respiratory: clear to auscultation bilaterally, no wheezing, no crackles. Normal respiratory effort. No accessory muscle use.  Cardiovascular: Regular rate and rhythm, no murmurs / rubs / gallops. No extremity edema. Abdomen: no tenderness, no masses palpated. No hepatosplenomegaly. Bowel sounds positive.obese  Musculoskeletal: no clubbing / cyanosis. No joint deformity upper and lower extremities.  no contractures. Normal muscle tone.  Skin: no rashes, lesions, ulcers. No induration Neurologic: CN 2-12 grossly intact. Sensation intactl. Strength 5/5  In upper ext, 3+ in lower extremity .    Psychiatric: Normal judgment and insight. Alert and oriented x 3. Normal mood.    Labs on Admission: I have personally reviewed following labs and imaging studies  CBC: Recent Labs  Lab 11/13/19 1620 11/13/19 1629  WBC 12.0*  --   NEUTROABS 9.7*  --   HGB 12.5 13.9  HCT 41.0 41.0  MCV 87.4  --   PLT 347  --    Basic Metabolic Panel: Recent Labs  Lab 11/13/19 1620 11/13/19 1629  NA 132* 133*  K 3.9 4.1  CL 93* 93*  CO2 28  --   GLUCOSE 100* 99  BUN 12 15  CREATININE 0.95 1.00  CALCIUM 9.5  --    GFR: CrCl cannot be calculated (Unknown ideal weight.). Liver Function Tests: Recent Labs  Lab 11/13/19 1620  AST 87*  ALT 54*  ALKPHOS 60  BILITOT 0.5  PROT 7.1  ALBUMIN 3.1*   No results for input(s): LIPASE, AMYLASE in the last 168 hours. No results for input(s): AMMONIA in the last 168 hours. Coagulation Profile: Recent Labs  Lab 11/13/19 1620  INR 1.0   Cardiac Enzymes: No results for input(s): CKTOTAL, CKMB, CKMBINDEX, TROPONINI in the last 168 hours. BNP (last 3 results) No results for input(s): PROBNP in the last 8760 hours. HbA1C: No results for input(s): HGBA1C in the last 72 hours. CBG: Recent Labs  Lab 11/13/19 1620  GLUCAP 97   Lipid Profile: No results for input(s): CHOL, HDL, LDLCALC, TRIG, CHOLHDL, LDLDIRECT in the last 72 hours. Thyroid Function Tests: No results for input(s): TSH, T4TOTAL, FREET4, T3FREE, THYROIDAB in the last 72 hours. Anemia Panel: No results for input(s): VITAMINB12, FOLATE, FERRITIN, TIBC, IRON, RETICCTPCT in the last 72 hours. Urine analysis:    Component Value Date/Time   APPEARANCEUR Clear 02/10/2018 1639   GLUCOSEU Negative 02/10/2018 1639   BILIRUBINUR Negative 02/10/2018 1639   PROTEINUR Negative 02/10/2018 1639   NITRITE Negative 02/10/2018 1639   LEUKOCYTESUR Negative 02/10/2018 1639    Radiological Exams on Admission: CT CERVICAL SPINE WO CONTRAST  Result Date: 11/13/2019 CLINICAL DATA:  Fall  EXAM: CT CERVICAL SPINE WITHOUT CONTRAST TECHNIQUE: Multidetector CT imaging of the cervical spine was performed without intravenous contrast. Multiplanar CT image reconstructions were also generated. COMPARISON:  None. FINDINGS: Alignment: Mild reversal the normal cervical lordosis, likely positional. No spondylolisthesis. Skull base and vertebrae: No acute fracture. No primary bone lesion or focal pathologic process. Soft tissues and spinal canal: No prevertebral fluid or swelling. No visible canal hematoma. Disc levels: Moderate loss of disc height from C3-C4 through C6-C7. Endplate spurring and mild spondylotic  disc bulging at these levels. No convincing disc herniation. Upper chest: No acute findings.  Clear lung apices. Other: None. IMPRESSION: 1. No fracture or acute finding. Electronically Signed   By: Lajean Manes M.D.   On: 11/13/2019 16:46   MR BRAIN WO CONTRAST  Result Date: 11/13/2019 CLINICAL DATA:  Left-sided weakness EXAM: MRI HEAD WITHOUT CONTRAST TECHNIQUE: Multiplanar, multiecho pulse sequences of the brain and surrounding structures were obtained without intravenous contrast. COMPARISON:  08/04/2019 FINDINGS: Brain: No acute infarct, acute hemorrhage or extra-axial collection. There is hyperintense T2-weighted signal within the pons and medulla oblongata, new since the prior study. Supratentorial white matter disease is also markedly worsened. No associated diffusion restriction. 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 Normal volume of CSF spaces. No chronic microhemorrhage. There is thickening of the pituitary infundibulum. Vascular: Normal flow voids. Skull and upper cervical spine: There is hyperintense T2-weighted signal visible within the upper spinal cord. Sinuses/Orbits: Negative. Other: None. IMPRESSION: 1. New white matter disease in the cerebrum, brainstem and visualized upper cervical spinal cord, thickening of the pituitary stalk. The primary differential considerations  are neurosarcoidosis and acute demyelinating disease. 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 Electronically Signed   By: Ulyses Jarred M.D.   On: 11/13/2019 19:37   CT HEAD CODE STROKE WO CONTRAST  Result Date: 11/13/2019 CLINICAL DATA:  Code stroke.  Altered mental status EXAM: CT HEAD WITHOUT CONTRAST TECHNIQUE: Contiguous axial images were obtained from the base of the skull through the vertex without intravenous contrast. COMPARISON:  08/04/2019 FINDINGS: Brain: There is no acute intracranial hemorrhage, mass effect, or edema. No new loss of gray-white differentiation. Ventricles and sulci are normal in size and configuration. Patchy hypoattenuation in the supratentorial white matter is nonspecific but probably reflects stable chronic microvascular ischemic changes. 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 No extra-axial fluid collection. Vascular: No hyperdense vessel. Mild intracranial atherosclerotic calcification is present at the skull base. Skull: Unremarkable Sinuses/Orbits: Aerated.  Orbits are unremarkable. Other: Opacification of the inferior right mastoid air cells. ASPECTS ([place] Stroke Program Early CT Score) - Ganglionic level infarction (caudate, lentiform nuclei, internal capsule, insula, M1-M3 cortex): 7 - Supraganglionic infarction (M4-M6 cortex): 3 Total score (0-10 with 10 being normal): 10 IMPRESSION: No acute intracranial hemorrhage or evidence of acute infarction. ASPECT score is 10. Stable chronic findings detailed above. These results were communicated to Dr. Lorraine Lax at 4:34 pmon 6/27/2021by text page via the [facility] messaging system. Electronically Signed   By: Macy Mis M.D.   On: 11/13/2019 16:36    EKG: Independently reviewed : as noted above Assessment/Plan Active Problems:   Demyelinating changes in brain [facility])     Acute Demyelinating Brain disease  NOS  - ? Autoimmune vs infectious  -current neurology recs as follows  -MRI brain with contrast and MRI C-spine with and without contrast -Lumbar puncture to evaluate for CSF protein, glucose, cell count, IgG index, cultures, Gram stain, CSF ACE level, CSF VDRL, consider paraneoplastic panel, additional sample physical hold for testing if needed -Obtain autoimmune work-up including SSA, SSB, ANA, ANCA, RF, ESR, CRP, ant NMO antibodies, anti MOG ( not ordered as it is a send out, but consider if above negative) -HIV, hepatitis C -Serum ACE, chest x-ray  Leukocytosis -? Occult infection  - ua negative /cxr pending  `  -no current fever  -  trend labs ? Stress response   Elevated AST/ALT  -unclear cause appears chronic  -trend labs  -no current gi complaints, or prior history of liver dysfunction - hold statin  - hepatitis lab  pending   Abnormal EKG -patient no cardiac symptoms  -repeat ekg pending  -echo this am for cva work up noted  -ce pending  Mild hyponatremia  -monitor labs  -gently ivfs trial  -further work if repeat labs progressive trend downward   CVA hx  -continue plavix   Hx of COVID  -s/p recovery with residual symptoms with debility  HTN -currently stable  -continue lisinopril /hctz   HLD -hold statin , due to mild elevation of lts    pre diabetes -ada diet  -check a1c  -place no fs only    Arthritis with chronic debility - wheel chair ped -OT/PT to see  Supportive care   FEN -electrolytes stable other than mentioned above Replete prn  DVT prophylaxis: heparin  Code Status:Full Family Communication: n/a Disposition Plan: 2-5 days  Consults called: Aroor MD neurology  Admission status: SDU  Clance Boll MD [facility]  If 7PM-7AM, please contact night-coverage [email or web address] Password [facility]  11/13/2019, 9:04 PM

## 2019-11-13 NOTE — ED Provider Notes (Signed)
[facility] EMERGENCY DEPARTMENT Provider Note   CSN: [id number] Arrival date & time: 11/13/19  1618     History Chief Complaint  Patient presents with  . Code Stroke  . Altered Mental Status    Rhonda [name] is a 59 y.o. female.  HPI   This patient is a 59 year old female, history is very limited based on paramedic report but apparently the patient had some difficulty speaking this morning that had a fall, she was back to normal and then had some difficulty speaking again which prompted paramedic call.  The patient is unable to give me much in the way of history, she does not even recall falling.  In route to the hospital the patient had some stuttering, she is unable to use both of her legs very well at baseline but states it has been even worse after that.  Denies chest pain shortness of breath or back pain, no fevers or chills.  Symptoms have seemed to improve slightly and she is not having any difficulty speaking at this time.  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  Review of the medical record shows that in January 2020 when the patient suffered an acute ischemic stroke of her left paracentral pons.  And the patient is currently taking both clopidogrel and aspirin.  Level 5 caveat applies secondary to memory loss  Past Medical History:  Diagnosis Date  . Arthritis   . Degenerative disc disease, lumbar   . Hypertension   . Prediabetes     Patient Active Problem List   Diagnosis Date Noted  . Demyelinating changes in brain ([place]) 11/13/2019  . Acute CVA (cerebrovascular accident) ([place]) 05/23/2019  . Transaminitis 05/23/2019  . Nausea and vomiting 05/23/2019  . Leukocytosis 05/23/2019  . Thoracic lymphadenopathy 08/26/2018  . Axillary lymphadenopathy 08/26/2018  . Piriformis syndrome of right side 09/29/2016  . OSA (obstructive sleep apnea) 09/27/2015  . Right hip pain 04/11/2015  . Essential hypertension  11/07/2014  . Chronic low back pain 11/07/2014  . Prediabetes 11/07/2014  . Vitamin D deficiency 11/07/2014    Past Surgical History:  Procedure Laterality Date  . ABDOMINAL HYSTERECTOMY    . BREAST EXCISIONAL BIOPSY Right   . BREAST SURGERY     right breast  . CESAREAN SECTION       OB History    Gravida  5   Para      Term      Preterm      AB  1   Living  4     SAB  1   TAB      Ectopic      Multiple  1   Live Births  4           Family History  Problem Relation Age of Onset  . Cancer Mother   . Hypertension Sister   . Diabetes Sister   . Diabetes Maternal Grandmother     Social History   Tobacco Use  . Smoking status: Never Smoker  . Smokeless tobacco: Never Used  Vaping Use  . Vaping Use: Never used  Substance Use Topics  . Alcohol use: No  . Drug use: No    Home Medications Prior to Admission medications   Medication Sig Start Date End Date Taking? Authorizing Provider  acetaminophen (TYLENOL) 650 MG CR tablet Take 1,300 mg by mouth 2 (two) times daily as needed (arthritis pain).    Yes [provider]  aspirin EC 81 MG tablet Take 1 tablet (81 mg total) by mouth daily. 05/24/19 05/23/20 Yes Dessa Phi, DO  atorvastatin (LIPITOR) 80 MG tablet Take 1 tablet (80 mg total) by mouth daily at 6 PM. Patient taking differently: Take 80 mg by mouth daily.  05/24/19 11/13/19 Yes Dessa Phi, DO  Cyanocobalamin (VITAMIN B-12 PO) Take 1 tablet by mouth daily.    Yes [provider]  gabapentin (NEURONTIN) 300 MG capsule TAKE 2 CAPSULES BY MOUTH 3 (THREE) TIMES DAILY. Patient taking differently: Take 300 mg by mouth 3 (three) times daily.  03/24/18  Yes Gildardo Pounds, NP  glucosamine-chondroitin 500-400 MG tablet Take 1 tablet by mouth 3 (three) times daily. Patient taking differently: Take 2 tablets by mouth daily.  04/11/15  Yes Funches, Adriana Mccallum, MD  lisinopril-hydrochlorothiazide (ZESTORETIC) 10-12.5 MG tablet Take 1 tablet  by mouth every evening.  07/11/19  Yes [provider]  meclizine (ANTIVERT) 25 MG tablet Take 1 tablet (25 mg total) by mouth 3 (three) times daily as needed for dizziness. 08/04/19  Yes Domenic Moras, PA-C  Multiple Vitamin (MULTIVITAMIN WITH MINERALS) TABS tablet Take 1 tablet by mouth daily.   Yes [provider]  Omega-3 Fatty Acids (FISH OIL) 1000 MG CAPS Take 1,000 mg by mouth at bedtime.   Yes [provider]  pantoprazole (PROTONIX) 40 MG tablet Take 40 mg by mouth 3 (three) times daily before meals.  07/16/19  Yes [provider]  PRESCRIPTION MEDICATION Inhale into the lungs See admin instructions. CPAP- At bedtime   Yes [provider]  APPLE CIDER VINEGAR PO Take 2 capsules by mouth at bedtime.    [provider]  clopidogrel (PLAVIX) 75 MG tablet Take 1 tablet (75 mg total) by mouth daily. Patient not taking: Reported on 11/13/2019 05/25/19   Dessa Phi, DO    Allergies    Aspirin and Augmentin [amoxicillin-pot clavulanate]  Review of Systems   Review of Systems  Unable to perform ROS: Mental status change    Physical Exam Updated Vital Signs BP (!) 153/96 (BP Location: Left Arm)   Pulse 95   Temp 98.2 F (36.8 C) (Oral)   Resp 17   Wt 73.5 kg   SpO2 100%   BMI 28.70 kg/m   Physical Exam Vitals and nursing note reviewed.  Constitutional:      General: She is not in acute distress.    Appearance: She is well-developed.  HENT:     Head: Normocephalic and atraumatic.     Mouth/Throat:     Pharynx: No oropharyngeal exudate.  Eyes:     General: No scleral icterus.       Right eye: No discharge.        Left eye: No discharge.     Conjunctiva/sclera: Conjunctivae normal.     Pupils: Pupils are equal, round, and reactive to light.  Neck:     Thyroid: No thyromegaly.     Vascular: No JVD.  Cardiovascular:     Rate and Rhythm: Normal rate and regular rhythm.     Heart sounds: Normal heart sounds. No murmur heard.   No friction rub. No gallop.   Pulmonary:     Effort: Pulmonary effort is normal. No respiratory distress.     Breath sounds: Normal breath sounds. No wheezing or rales.  Abdominal:     General: Bowel sounds are normal. There is no distension.     Palpations: Abdomen is soft. There is no  mass.     Tenderness: There is no abdominal tenderness.  Musculoskeletal:        General: No tenderness. Normal range of motion.     Cervical back: Normal range of motion and neck supple.     Comments: Bilateral leg weakness but no obvious deformities  Lymphadenopathy:     Cervical: No cervical adenopathy.  Skin:    General: Skin is warm and dry.     Findings: No erythema or rash.  Neurological:     Mental Status: She is alert.     Coordination: Coordination normal.     Comments: Slight disconjugate gaze, bilateral lower extremities have normal sensation but difficulty lifting either off the bed.,  Grips are equal bilaterally, peripheral visual fields seem to be intact as do her extraocular movements, slight disconjugate gaze  Psychiatric:        Behavior: Behavior normal.     ED Results / Procedures / Treatments   Labs (all labs ordered are listed, but only abnormal results are displayed) Labs Reviewed  CBC - Abnormal; Notable for the following components:      Result Value   WBC 12.0 (*)    RDW 16.1 (*)    All other components within normal limits  DIFFERENTIAL - Abnormal; Notable for the following components:   Neutro Abs 9.7 (*)    Abs Immature Granulocytes 0.12 (*)    All other components within normal limits  COMPREHENSIVE METABOLIC PANEL - Abnormal; Notable for the following components:   Sodium 132 (*)    Chloride 93 (*)    Glucose, Bld 100 (*)    Albumin 3.1 (*)    AST 87 (*)    ALT 54 (*)    All other components within normal limits  SEDIMENTATION RATE - Abnormal; Notable for the following components:   Sed Rate 64 (*)    All other components within normal limits    URINALYSIS, ROUTINE W REFLEX MICROSCOPIC - Abnormal; Notable for the following components:   Color, Urine AMBER (*)    APPearance CLOUDY (*)    Hgb urine dipstick SMALL (*)    Protein, ur 30 (*)    Nitrite POSITIVE (*)    Leukocytes,Ua SMALL (*)    Bacteria, UA MANY (*)    All other components within normal limits  COMPREHENSIVE METABOLIC PANEL - Abnormal; Notable for the following components:   Sodium 133 (*)    Chloride 95 (*)    Glucose, Bld 150 (*)    Albumin 3.0 (*)    AST 74 (*)    ALT 52 (*)    All other components within normal limits  CBC - Abnormal; Notable for the following components:   WBC 11.2 (*)    RDW 16.2 (*)    All other components within normal limits  HEMOGLOBIN A1C - Abnormal; Notable for the following components:   Hgb A1c MFr Bld 5.9 (*)    All other components within normal limits  CSF CELL COUNT WITH DIFFERENTIAL - Abnormal; Notable for the following components:   Color, CSF YELLOW (*)    Appearance, CSF HAZY (*)    RBC Count, CSF 6 (*)    WBC, CSF 110 (*)    Lymphs, CSF 95 (*)    Monocyte-Macrophage-Spinal Fluid 4 (*)    All other components within normal limits  PROTEIN AND GLUCOSE, CSF - Abnormal; Notable for the following components:   Glucose, CSF <20 (*)    Total  Protein, CSF 592 (*)  All other components within normal limits  BASIC METABOLIC PANEL - Abnormal; Notable for the following components:   Sodium 131 (*)    Chloride 95 (*)    Calcium 8.7 (*)    All other components within normal limits  GLUCOSE, CAPILLARY - Abnormal; Notable for the following components:   Glucose-Capillary 101 (*)    All other components within normal limits  I-STAT CHEM 8, ED - Abnormal; Notable for the following components:   Sodium 133 (*)    Chloride 93 (*)    Calcium, Ion 1.04 (*)    TCO2 33 (*)    All other components within normal limits  TROPONIN I (HIGH SENSITIVITY) - Abnormal; Notable for the following components:   Troponin I (High  Sensitivity) 20 (*)    All other components within normal limits  SARS CORONAVIRUS 2 BY RT PCR (HOSPITAL ORDER, [facility])  CSF CULTURE  CULTURE, FUNGUS WITHOUT SMEAR  PROTIME-INR  APTT  RHEUMATOID FACTOR  C-REACTIVE PROTEIN  ANGIOTENSIN CONVERTING ENZYME  HIV ANTIBODY (ROUTINE TESTING W REFLEX)  HEPATITIS C ANTIBODY  TSH  VITAMIN B12  FOLATE  GLUCOSE, CAPILLARY  CSF IGG  CRYPTOCOCCAL ANTIGEN, CSF  ANA  ANCA TITERS  NEUROMYELITIS OPTICA AUTOAB, IGG  ANGIOTENSIN CONVERTING ENZYME, CSF  ANGIOTENSIN CONVERTING ENZYME  LYMPHOCYTE SUBSETS, FLOW CYTOMETRY (INPT)  CBG MONITORING, ED  CYTOLOGY - NON PAP  SURGICAL PATHOLOGY    EKG EKG Interpretation  Date/Time:  Sunday November 13 2019 21:41:36 EDT Ventricular Rate:  92 PR Interval:    QRS Duration: 92 QT Interval:  376 QTC Calculation: 466 R Axis:   3 Text Interpretation: Sinus rhythm Consider right atrial enlargement Probable anteroseptal infarct, acute since last tracing no significant change Confirmed by Noemi Chapel [phone]) on 11/13/2019 9:43:49 PM   Radiology DG Chest 2 View  Result Date: 11/13/2019 CLINICAL DATA:  [place] this morning, altered level of consciousness EXAM: CHEST - 2 VIEW COMPARISON:  05/23/2019 FINDINGS: The heart size and mediastinal contours are within normal limits. Both lungs are clear. The visualized skeletal structures are unremarkable. IMPRESSION: No active cardiopulmonary disease. Electronically Signed   By: Randa Ngo M.D.   On: 11/13/2019 22:36   MR BRAIN WO CONTRAST  Result Date: 11/13/2019 CLINICAL DATA:  Left-sided weakness EXAM: MRI HEAD WITHOUT CONTRAST TECHNIQUE: Multiplanar, multiecho pulse sequences of the brain and surrounding structures were obtained without intravenous contrast. COMPARISON:  08/04/2019 FINDINGS: Brain: No acute infarct, acute hemorrhage or extra-axial collection. There is hyperintense T2-weighted signal within the pons and medulla oblongata,  new since the prior study. Supratentorial white matter disease is also markedly worsened. No associated diffusion restriction. 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 Normal volume of CSF spaces. No chronic microhemorrhage. There is thickening of the pituitary infundibulum. Vascular: Normal flow voids. Skull and upper cervical spine: There is hyperintense T2-weighted signal visible within the upper spinal cord. Sinuses/Orbits: Negative. Other: None. IMPRESSION: 1. New white matter disease in the cerebrum, brainstem and visualized upper cervical spinal cord, thickening of the pituitary stalk. The primary differential considerations are neurosarcoidosis and acute demyelinating disease. 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 Electronically Signed   By: Ulyses Jarred M.D.   On: 11/13/2019 19:37   MR BRAIN W CONTRAST  Result Date: 11/14/2019 CLINICAL DATA:  Acute encephalopathy EXAM: MRI HEAD WITH CONTRAST TECHNIQUE: Multiplanar, multiecho pulse  sequences of the brain and surrounding structures were obtained with intravenous contrast. CONTRAST:  9.36m GADAVIST GADOBUTROL 1 MMOL/ML IV SOLN COMPARISON:  None. FINDINGS: There is a large amount of leptomeningeal contrast enhancement within the posterior fossa and along the brainstem. Supratentorial leptomeningeal contrast enhancement is greatest along the inter hemispheric fissure and near the circle-of-Willis. The infundibulum is markedly thickened. Enhancement extends along the cisternal segments of cranial nerves 5, 7 and 8. The smaller lower cranial nerves are difficult to visualize on this study. IMPRESSION: Extensive leptomeningeal contrast enhancement favoring the posterior fossa and brainstem with thickening of the infundibulum. This is most typical of neurosarcoid. Other forms of inflammatory/infectious leptomeningitis remain  possible, but are less likely. Electronically Signed   By: KUlyses JarredM.D.   On: 11/14/2019 00:53   MR Cervical Spine W or Wo Contrast  Result Date: 11/14/2019 CLINICAL DATA:  Encephalopathy and weakness. EXAM: MRI CERVICAL SPINE WITHOUT AND WITH CONTRAST MRI THORACIC SPINE WITHOUT AND WITH CONTRAST TECHNIQUE: Multiplanar and multiecho pulse sequences of the cervical spine, to include the craniocervical junction and cervicothoracic junction, were obtained without and with intravenous contrast. CONTRAST:  9.586mGADAVIST GADOBUTROL 1 MMOL/ML IV SOLN COMPARISON:  None. FINDINGS: CERVICAL SPINE FINDINGS Alignment: Physiologic. Vertebrae: Heterogeneous bone marrow signal without focal lesion. Cord: There is hyperintense T2-weighted signal within the cord at the cervicomedullary junction and C2 level. There is leptomeningeal contrast enhancement extending the length of the cervical spine, greatest at the cervicomedullary junction and the C2 and C3 levels. Posterior Fossa, vertebral arteries, paraspinal tissues: Posterior fossa is evaluated separately on brain MRI. No prevertebral effusion. Normal flow voids. Disc levels: C2-3: Negative C3-4: Small disc bulge without spinal canal stenosis. C4-5: Small diffuse disc bulge.  No spinal canal stenosis. C5-6: Left foraminal disc protrusion and uncovertebral hypertrophy with severe left foraminal stenosis. C6-7: Mild disc bulge without spinal canal stenosis. C7-T1: Negative THORACIC SPINE FINDINGS Alignment: Physiologic. Vertebrae: No fracture, evidence of discitis, or bone lesion. Cord: There is multifocal leptomeningeal enhancement along the length of the thoracic spinal cord. Slightly less extensive involvement of the thoracic spine and the cervical spine. Paraspinal tissues: Negative. Disc levels: No disc herniation or stenosis. IMPRESSION: 1. Multifocal leptomeningeal contrast enhancement along the length of the cervical and thoracic spinal cord, compatible with  neurosarcoid. Other forms of inflammatory or infectious encephalomyelitis are considered less likely. 2. No cervical or thoracic spinal canal stenosis. 3. 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 Electronically Signed   By: KeUlyses Jarred.D.   On: 11/14/2019 00:46   MR THORACIC SPINE W WO CONTRAST  Result Date: 11/14/2019 CLINICAL DATA:  Encephalopathy and weakness. EXAM: MRI CERVICAL SPINE WITHOUT AND WITH CONTRAST MRI THORACIC SPINE WITHOUT AND WITH CONTRAST TECHNIQUE: Multiplanar and multiecho pulse sequences of the cervical spine, to include the craniocervical junction and cervicothoracic junction, were obtained without and with intravenous contrast. CONTRAST:  9.2m72mADAVIST GADOBUTROL 1 MMOL/ML IV SOLN COMPARISON:  None. FINDINGS: CERVICAL SPINE FINDINGS Alignment: Physiologic. Vertebrae: Heterogeneous bone marrow signal without focal lesion. Cord: There is hyperintense T2-weighted signal within the cord at the cervicomedullary junction and C2 level. There is leptomeningeal contrast enhancement extending the length of the cervical spine, greatest at the cervicomedullary junction and the C2 and C3 levels. Posterior Fossa, vertebral arteries, paraspinal tissues: Posterior fossa is evaluated separately on brain MRI. No prevertebral effusion. Normal flow voids. Disc levels: C2-3: Negative C3-4: Small disc bulge without spinal canal stenosis. C4-5: Small diffuse disc  bulge.  No spinal canal stenosis. C5-6: Left foraminal disc protrusion and uncovertebral hypertrophy with severe left foraminal stenosis. C6-7: Mild disc bulge without spinal canal stenosis. C7-T1: Negative THORACIC SPINE FINDINGS Alignment: Physiologic. Vertebrae: No fracture, evidence of discitis, or bone lesion. Cord: There is multifocal leptomeningeal enhancement along the length of the thoracic spinal cord. Slightly less extensive involvement of the thoracic spine and the cervical  spine. Paraspinal tissues: Negative. Disc levels: No disc herniation or stenosis. IMPRESSION: 1. Multifocal leptomeningeal contrast enhancement along the length of the cervical and thoracic spinal cord, compatible with neurosarcoid. Other forms of inflammatory or infectious encephalomyelitis are considered less likely. 2. No cervical or thoracic spinal canal stenosis. 3. 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 Electronically Signed   By: Ulyses Jarred M.D.   On: 11/14/2019 00:46   ECHOCARDIOGRAM COMPLETE  Result Date: 11/14/2019    ECHOCARDIOGRAM REPORT   Patient Name:   CHATTIE GREESON [facility] Date of Exam: 11/14/2019 Medical Rec #:  [id number]      Height:       63.0 in Accession #:    [id number]     Weight:       163.1 lb Date of Birth:  Sep 16, 1960      BSA:          1.773 m Patient Age:    33 years       BP:           130/89 mmHg Patient Gender: F              HR:           97 bpm. Exam Location:  Inpatient Procedure: 2D Echo Indications:    TIA  History:        Patient has prior history of Echocardiogram examinations, most                 recent 05/23/2019. Risk Factors:Hypertension.  Sonographer:    Mikki Santee RDCS (AE) Referring Phys: [id number] [place]  1. Left ventricular ejection fraction, by estimation, is 65 to 70%. The left ventricle has normal function. The left ventricle has no regional wall motion abnormalities. There is mild left ventricular hypertrophy. Left ventricular diastolic parameters were normal.  2. Right ventricular systolic function is normal. The right ventricular size is normal. There is normal pulmonary artery systolic pressure.  3. The mitral valve is normal in structure. Trivial mitral valve regurgitation.  4. The aortic valve is tricuspid. Aortic valve regurgitation is not visualized.  5. The inferior vena cava is normal in size with greater than 50% respiratory variability, suggesting right atrial  pressure of 3 mmHg. Comparison(s): No significant change from prior study. FINDINGS  Left Ventricle: Left ventricular ejection fraction, by estimation, is 65 to 70%. The left ventricle has normal function. The left ventricle has no regional wall motion abnormalities. The left ventricular internal cavity size was normal in size. There is  mild left ventricular hypertrophy. Left ventricular diastolic parameters were normal. Right Ventricle: The right ventricular size is normal. No increase in right ventricular wall thickness. Right ventricular systolic function is normal. There is normal pulmonary artery systolic pressure. The tricuspid regurgitant velocity is 2.00 m/s, and  with an assumed right atrial pressure of 3 mmHg, the estimated right ventricular systolic pressure is 17.9 mmHg. Left Atrium: Left atrial size was normal in size. Right Atrium: Right atrial size was normal in size. Pericardium: There is no evidence of  pericardial effusion. Mitral Valve: The mitral valve is normal in structure. Trivial mitral valve regurgitation. Tricuspid Valve: The tricuspid valve is normal in structure. Tricuspid valve regurgitation is trivial. Aortic Valve: The aortic valve is tricuspid. Aortic valve regurgitation is not visualized. Pulmonic Valve: The pulmonic valve was normal in structure. Pulmonic valve regurgitation is not visualized. Aorta: The aortic root is normal in size and structure. Venous: The inferior vena cava is normal in size with greater than 50% respiratory variability, suggesting right atrial pressure of 3 mmHg. IAS/Shunts: No atrial level shunt detected by color flow Doppler.  LEFT VENTRICLE PLAX 2D LVIDd:         4.00 cm  Diastology LVIDs:         2.50 cm  LV e' lateral:   6.42 cm/s LV PW:         1.20 cm  LV E/e' lateral: 12.1 LV IVS:        1.10 cm  LV e' medial:    6.20 cm/s LVOT diam:     2.00 cm  LV E/e' medial:  12.6 LV SV:         74 LV SV Index:   42 LVOT Area:     3.14 cm  RIGHT VENTRICLE RV S  prime:     9.25 cm/s TAPSE (M-mode): 2.0 cm LEFT ATRIUM             Index       RIGHT ATRIUM          Index LA diam:        2.80 cm 1.58 cm/m  RA Area:     9.14 cm LA Vol (A2C):   37.0 ml 20.87 ml/m RA Volume:   18.70 ml 10.55 ml/m LA Vol (A4C):   35.4 ml 19.96 ml/m LA Biplane Vol: 38.1 ml 21.49 ml/m  AORTIC VALVE LVOT Vmax:   123.00 cm/s LVOT Vmean:  85.700 cm/s LVOT VTI:    0.235 m  AORTA Ao Root diam: 2.60 cm MITRAL VALVE               TRICUSPID VALVE MV Area (PHT): 2.68 cm    TR Peak grad:   16.0 mmHg MV Decel Time: 283 msec    TR Vmax:        200.00 cm/s MV E velocity: 78.00 cm/s MV A velocity: 86.10 cm/s  SHUNTS MV E/A ratio:  0.91        Systemic VTI:  0.24 m                            Systemic Diam: 2.00 cm Dorris Carnes MD Electronically signed by Dorris Carnes MD Signature Date/Time: 11/14/2019/6:18:29 PM    Final     Procedures Procedures (including critical care time)  Medications Ordered in ED Medications  sodium chloride flush (NS) 0.9 % injection 3 mL (has no administration in time range)  sodium chloride flush (NS) 0.9 % injection 3 mL (3 mLs Intravenous Given 11/15/19 0906)  acetaminophen (TYLENOL) tablet 650 mg (has no administration in time range)    Or  acetaminophen (TYLENOL) suppository 650 mg (has no administration in time range)  senna-docusate (Senokot-S) tablet 1 tablet (has no administration in time range)  ondansetron (ZOFRAN) tablet 4 mg (has no administration in time range)    Or  ondansetron (ZOFRAN) injection 4 mg (has no administration in time range)  albuterol (PROVENTIL) (2.5 MG/3ML) 0.083% nebulizer solution 2.5 mg (  has no administration in time range)  pantoprazole (PROTONIX) EC tablet 40 mg (40 mg Oral Given 11/15/19 1603)  multivitamin with minerals tablet 1 tablet (1 tablet Oral Given 11/15/19 0906)  methylPREDNISolone sodium succinate (SOLU-MEDROL) 1,000 mg in sodium chloride 0.9 % 50 mL IVPB (1,000 mg Intravenous New Bag/Given 11/15/19 1031)  benzonatate  (TESSALON) capsule 100 mg (100 mg Oral Given 11/15/19 1510)  menthol-cetylpyridinium (CEPACOL) lozenge 3 mg (has no administration in time range)  gadobutrol (GADAVIST) 1 MMOL/ML injection 9.5 mL (9.5 mLs Intravenous Contrast Given 11/14/19 0011)    ED Course  I have reviewed the triage vital signs and the nursing notes.  Pertinent labs & imaging results that were available during my care of the patient were reviewed by me and considered in my medical decision making (see chart for details).    MDM Rules/Calculators/A&P                          This patient will need to be seen by neurology, they were at the bedside on the patient's arrival.  I met the patient at the bridge with the paramedics, this patient will need to go to CT scan and have further input by neurology service.  She has a weakness in the legs though she has some chronic weakness in the legs and actually gets around by wheelchair.  She does not have any speech abnormalities at this time.  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 Lorraine Lax, recommendations were made to admit to the medical service.  They will see the patient in consultation to make formal recommendations on whether the patient needs a spinal tap done by radiology and whether steroids would be beneficial.  Final Clinical Impression(s) / ED Diagnoses Final diagnoses:  Weakness of both legs  Aphasia    Rx / DC Orders ED Discharge Orders    None       Noemi Chapel, MD 11/15/19 1713

## 2019-11-13 NOTE — Code Documentation (Signed)
Code Stroke  Code stroke activated at 62 via [facility] for pt having new onset altered mental status and dysarthria. LKW 1340. Per EMS report, pt fell and hit her head this morning around 0800. Her family called EMS, but Ms. Cullin refused treatment at that time. This evening her family noticed her to be argumentative and stuttering and called EMS. Pt arrived to [facility] ED with bilateral lower extremity weakness and mild dysarthria, NIH 5. Pt states she uses a wheelchair at home. CT head completed. Code stroke cancelled at 1647.  [place] [facility] Rapid Response RN

## 2019-11-13 NOTE — ED Notes (Signed)
IV team at bedside attempting for IV.

## 2019-11-13 NOTE — ED Notes (Signed)
Code Stroke Canceled.

## 2019-11-14 ENCOUNTER — Inpatient Hospital Stay (HOSPITAL_COMMUNITY): Payer: BLUE CROSS/BLUE SHIELD

## 2019-11-14 DIAGNOSIS — M5441 Lumbago with sciatica, right side: Secondary | ICD-10-CM

## 2019-11-14 DIAGNOSIS — R7303 Prediabetes: Secondary | ICD-10-CM

## 2019-11-14 DIAGNOSIS — I10 Essential (primary) hypertension: Secondary | ICD-10-CM

## 2019-11-14 DIAGNOSIS — G8929 Other chronic pain: Secondary | ICD-10-CM

## 2019-11-14 DIAGNOSIS — M5442 Lumbago with sciatica, left side: Secondary | ICD-10-CM

## 2019-11-14 DIAGNOSIS — I6389 Other cerebral infarction: Secondary | ICD-10-CM

## 2019-11-14 DIAGNOSIS — G4733 Obstructive sleep apnea (adult) (pediatric): Secondary | ICD-10-CM

## 2019-11-14 DIAGNOSIS — E559 Vitamin D deficiency, unspecified: Secondary | ICD-10-CM

## 2019-11-14 LAB — CSF CELL COUNT WITH DIFFERENTIAL
Eosinophils, CSF: 0 % (ref 0–1)
Lymphs, CSF: 95 % — ABNORMAL HIGH (ref 40–80)
Monocyte-Macrophage-Spinal Fluid: 4 % — ABNORMAL LOW (ref 15–45)
RBC Count, CSF: 6 /mm3 — ABNORMAL HIGH
Segmented Neutrophils-CSF: 1 % (ref 0–6)
Tube #: 3
WBC, CSF: 110 /mm3 (ref 0–5)

## 2019-11-14 LAB — HIV ANTIBODY (ROUTINE TESTING W REFLEX): HIV Screen 4th Generation wRfx: NONREACTIVE

## 2019-11-14 LAB — URINALYSIS, ROUTINE W REFLEX MICROSCOPIC
Bilirubin Urine: NEGATIVE
Glucose, UA: NEGATIVE mg/dL
Ketones, ur: NEGATIVE mg/dL
Nitrite: POSITIVE — AB
Protein, ur: 30 mg/dL — AB
Specific Gravity, Urine: 1.014 (ref 1.005–1.030)
pH: 7 (ref 5.0–8.0)

## 2019-11-14 LAB — COMPREHENSIVE METABOLIC PANEL
ALT: 52 U/L — ABNORMAL HIGH (ref 0–44)
AST: 74 U/L — ABNORMAL HIGH (ref 15–41)
Albumin: 3 g/dL — ABNORMAL LOW (ref 3.5–5.0)
Alkaline Phosphatase: 60 U/L (ref 38–126)
Anion gap: 9 (ref 5–15)
BUN: 12 mg/dL (ref 6–20)
CO2: 29 mmol/L (ref 22–32)
Calcium: 9.3 mg/dL (ref 8.9–10.3)
Chloride: 95 mmol/L — ABNORMAL LOW (ref 98–111)
Creatinine, Ser: 0.94 mg/dL (ref 0.44–1.00)
GFR calc Af Amer: >60 mL/min (ref >60)
GFR calc non Af Amer: >60 mL/min (ref >60)
Glucose, Bld: 150 mg/dL — ABNORMAL HIGH (ref 70–99)
Potassium: 4 mmol/L (ref 3.5–5.1)
Sodium: 133 mmol/L — ABNORMAL LOW (ref 135–145)
Total Bilirubin: 0.7 mg/dL (ref 0.3–1.2)
Total Protein: 7.2 g/dL (ref 6.5–8.1)

## 2019-11-14 LAB — GLUCOSE, CAPILLARY: Glucose-Capillary: 84 mg/dL (ref 70–99)

## 2019-11-14 LAB — SEDIMENTATION RATE: Sed Rate: 64 mm/hr — ABNORMAL HIGH (ref 0–22)

## 2019-11-14 LAB — CBC
HCT: 39 % (ref 36.0–46.0)
Hemoglobin: 12.4 g/dL (ref 12.0–15.0)
MCH: 27.1 pg (ref 26.0–34.0)
MCHC: 31.8 g/dL (ref 30.0–36.0)
MCV: 85.3 fL (ref 80.0–100.0)
Platelets: 339 10*3/uL (ref 150–400)
RBC: 4.57 MIL/uL (ref 3.87–5.11)
RDW: 16.2 % — ABNORMAL HIGH (ref 11.5–15.5)
WBC: 11.2 10*3/uL — ABNORMAL HIGH (ref 4.0–10.5)
nRBC: 0 % (ref 0.0–0.2)

## 2019-11-14 LAB — HEPATITIS C ANTIBODY: HCV Ab: NONREACTIVE

## 2019-11-14 LAB — VITAMIN B12: Vitamin B-12: 869 pg/mL (ref 180–914)

## 2019-11-14 LAB — ECHOCARDIOGRAM COMPLETE: Weight: 2610.25 oz

## 2019-11-14 LAB — PROTEIN AND GLUCOSE, CSF
Glucose, CSF: <20 mg/dL — CL (ref 40–70)
Total  Protein, CSF: 592 mg/dL — ABNORMAL HIGH (ref 15–45)

## 2019-11-14 LAB — CRYPTOCOCCAL ANTIGEN, CSF: Crypto Ag: NEGATIVE

## 2019-11-14 LAB — SARS CORONAVIRUS 2 BY RT PCR (HOSPITAL ORDER, PERFORMED IN ~~LOC~~ HOSPITAL LAB): SARS Coronavirus 2: NEGATIVE

## 2019-11-14 LAB — FOLATE: Folate: 32.8 ng/mL (ref >5.9)

## 2019-11-14 LAB — C-REACTIVE PROTEIN: CRP: 0.6 mg/dL (ref <1.0)

## 2019-11-14 LAB — TSH: TSH: 2.037 u[IU]/mL (ref 0.350–4.500)

## 2019-11-14 LAB — HEMOGLOBIN A1C
Hgb A1c MFr Bld: 5.9 % — ABNORMAL HIGH (ref 4.8–5.6)
Mean Plasma Glucose: 122.63 mg/dL

## 2019-11-14 LAB — TROPONIN I (HIGH SENSITIVITY): Troponin I (High Sensitivity): 20 ng/L — ABNORMAL HIGH (ref <18)

## 2019-11-14 MED ORDER — PANTOPRAZOLE SODIUM 40 MG PO TBEC
40.0000 mg | DELAYED_RELEASE_TABLET | Freq: Three times a day (TID) | ORAL | Status: DC
  Administered 2019-11-15 – 2019-11-28 (×39): 40 mg via ORAL
  Filled 2019-11-14 (×39): qty 1

## 2019-11-14 MED ORDER — GADOBUTROL 1 MMOL/ML IV SOLN
9.5000 mL | Freq: Once | INTRAVENOUS | Status: AC | PRN
  Administered 2019-11-14: 9.5 mL via INTRAVENOUS

## 2019-11-14 MED ORDER — ADULT MULTIVITAMIN W/MINERALS CH
1.0000 | ORAL_TABLET | Freq: Every day | ORAL | Status: DC
  Administered 2019-11-14 – 2019-11-28 (×15): 1 via ORAL
  Filled 2019-11-14 (×15): qty 1

## 2019-11-14 NOTE — Progress Notes (Signed)
PROGRESS NOTE    Rhonda [name]  DEY:[id number] DOB: 12-30-1960 DOA: 11/13/2019 PCP: Bartholome Bill, MD    Brief Narrative:  Patient admitted to the hospital with a working diagnosis of acute demyelinating brain disease.  59 year old female who presented with altered mental status.  She has significant past medical history for hypertension, dyslipidemia, CVA (January/2021), COVID-19 (06/2019).  At home patient had an episode of vertigo, she fell and hit her head without loss of consciousness.  EMS evaluated patient and recommended outpatient follow-up.  The patient later on developed confusion and agitation.  EMS was called again and she was brought to the hospital.  On her initial physical examination temperature 99.4, blood pressure 114/81, heart rate 94, respiratory rate 21, oxygen saturation 95% on room air.  Her lungs are clear to auscultation bilaterally, heart S1-S2 present rhythmic, soft abdomen, no lower extremity edema.  She was neurologically nonfocal. Sodium 133, potassium 4.1, chloride 93, bicarb 28, glucose 98, BUN 15, creatinine 1.0, white count 12.0, hemoglobin 12.5, hematocrit 41.0, platelets 347.  SARS COVID-19 was negative.  Head CT no acute changes.  Brain MRI had changes suggestion of neurosarcoid. EKG 91 bpm, normal axis, normal intervals, sinus rhythm, ST depressions with II. III and aVF, J-point elevation V1/V2,  Assessment & Plan:   Principal Problem:   Demyelinating changes in brain [facility]) Active Problems:   Essential hypertension   Chronic low back pain   Prediabetes   Vitamin D deficiency   OSA (obstructive sleep apnea)   1. Demyelinating brain diease/ neuro sarcoid. Patient clinically has been improving no further confusion, but continue to be very weak and deconditioned. Had LP this am with 110 wbc, <20 glucose and 592 protein.   Will continue with neuro checks, PT/OT. Follow with neurology recommendations.  2. HTN. Continue blood pressure monitoring.  Will dc IV fluids for now. Continue to hold on blood pressure medications for now to prevent hypotension.   3. Prediabetes. Close follow up of glucose.  4, Chronic back pain. Seems to be stable. Follow with PT and OT.     Status is: Inpatient  Remains inpatient appropriate because:Inpatient level of care appropriate due to severity of illness   Dispo: The patient is from: Home              Anticipated d/c is to: SNF              Anticipated d/c date is: 3 days              Patient currently is not medically stable to d/c.   DVT prophylaxis: Enoxaparin   Code Status:   full  Family Communication:  I spoke with patient's husband at the bedside, we talked in detail about patient's condition, plan of care and prognosis and all questions were addressed.     Consultants:   Neurology   Procedures:   LP   Subjective: Patient is feeling better, but continue to be very weak and deconditioned no nausea or vomiting, no dyspnea or chest pain.   Objective: Vitals:   11/14/19 0050 11/14/19 0415 11/14/19 0500 11/14/19 0730  BP: 135/89 (!) 134/95  (!) 137/92  Pulse: 98 91  95  Resp: 19 18  14   Temp: 98.8 F (37.1 C) 98.2 F (36.8 C)  98.3 F (36.8 C)  TempSrc: Oral Oral  Oral  SpO2: 100% 100%  100%  Weight:   74 kg     Intake/Output Summary (Last 24 hours) at 11/14/2019 0930  Last data filed at 11/14/2019 0300 Gross per 24 hour  Intake 107.75 ml  Output --  Net 107.75 ml   Filed Weights   11/14/19 0500  Weight: 74 kg    Examination:   General: Not in pain or dyspnea, deconditioned  Neurology: Awake and alert. Patient in bed strength not assessed.  E ENT: mild pallor, no icterus, oral mucosa moist Cardiovascular: No JVD. S1-S2 present, rhythmic, no gallops, rubs, or murmurs. No lower extremity edema. Pulmonary: positive breath sounds bilaterally, adequate air movement, no wheezing, rhonchi or rales. Gastrointestinal. Abdomen with no organomegaly, non tender, no  rebound or guarding Skin. No rashes Musculoskeletal: no deformities.      Data Reviewed: I have personally reviewed following labs and imaging studies  CBC: Recent Labs  Lab 11/13/19 1620 11/13/19 1629 11/14/19 0109  WBC 12.0*  --  11.2*  NEUTROABS 9.7*  --   --   HGB 12.5 13.9 12.4  HCT 41.0 41.0 39.0  MCV 87.4  --  85.3  PLT 347  --  384   Basic Metabolic Panel: Recent Labs  Lab 11/13/19 1620 11/13/19 1629 11/14/19 0109  NA 132* 133* 133*  K 3.9 4.1 4.0  CL 93* 93* 95*  CO2 28  --  29  GLUCOSE 100* 99 150*  BUN 12 15 12   CREATININE 0.95 1.00 0.94  CALCIUM 9.5  --  9.3   GFR: CrCl cannot be calculated (Unknown ideal weight.). Liver Function Tests: Recent Labs  Lab 11/13/19 1620 11/14/19 0109  AST 87* 74*  ALT 54* 52*  ALKPHOS 60 60  BILITOT 0.5 0.7  PROT 7.1 7.2  ALBUMIN 3.1* 3.0*   No results for input(s): LIPASE, AMYLASE in the last 168 hours. No results for input(s): AMMONIA in the last 168 hours. Coagulation Profile: Recent Labs  Lab 11/13/19 1620  INR 1.0   Cardiac Enzymes: No results for input(s): CKTOTAL, CKMB, CKMBINDEX, TROPONINI in the last 168 hours. BNP (last 3 results) No results for input(s): PROBNP in the last 8760 hours. HbA1C: Recent Labs    11/14/19 0109  HGBA1C 5.9*   CBG: Recent Labs  Lab 11/13/19 1620 11/14/19 0609  GLUCAP 97 84   Lipid Profile: No results for input(s): CHOL, HDL, LDLCALC, TRIG, CHOLHDL, LDLDIRECT in the last 72 hours. Thyroid Function Tests: Recent Labs    11/14/19 0109  TSH 2.037   Anemia Panel: Recent Labs    11/14/19 0109  VITAMINB12 869  FOLATE 32.8      Radiology Studies: I have reviewed all of the imaging during this hospital visit personally     Scheduled Meds: . sodium chloride flush  3 mL Intravenous Once  . sodium chloride flush  3 mL Intravenous Q12H   Continuous Infusions: . sodium chloride 75 mL/hr at 11/14/19 0253     LOS: 1 day        Yaasir Menken  Gerome Apley, MD

## 2019-11-14 NOTE — Evaluation (Signed)
Physical Therapy Evaluation Patient Details Name: Rhonda [name] MRN: [id number] DOB: 1960-09-20 Today's Date: 11/14/2019   History of Present Illness  Pt is a 59 year old woman admitted 11/13/19 with AMS and agitation s/p fall with bump to the head. 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 LP pending. PMH: HTN, prediabetes, CVA Jan 2021, COVID 06/2019, arthritis, vertigo.  Clinical Impression  Patient presents with generalized weakness, impaired balance, impaired cognition and impaired mobility s/p above. Pt lives with spouse and reports mainly using w/c for mobility PTA and help with transfers. Has been doing her own ADLs but needs help with getting in/out of the shower. Today, pt requires Mod A for bed mobility and transfers with marked weakness in LEs. Would benefit from CIR to maximize independence and mobility prior to return home. Will follow acutely.    Follow Up Recommendations CIR;Supervision for mobility/OOB;Supervision/Assistance - 24 hour    Equipment Recommendations  None recommended by PT    Recommendations for Other Services       Precautions / Restrictions Precautions Precautions: Fall Precaution Comments: pt with hx of vertigo, urinary incontinence Restrictions Weight Bearing Restrictions: No      Mobility  Bed Mobility Overal bed mobility: Needs Assistance Bed Mobility: Supine to Sit     Supine to sit: Mod assist     General bed mobility comments: increased time and effort, cues for technique, assist to raise trunk  Transfers Overall transfer level: Needs assistance Equipment used: Rolling walker (2 wheeled);2 person hand held assist Transfers: Sit to/from [facility] Sit to Stand: Mod assist Stand pivot transfers: Mod assist       General transfer comment: assist to rise and steady, heavily reliant on UEs to stand and can maintain only momentarily,  stabilizes LEs on sitting surface; SPT bed to [facility] with pt able to take a few steps; pulled recliner up behind pt after she stood from [facility]  Ambulation/Gait             General Gait Details: Unable to today  Stairs            Wheelchair Mobility    Modified Rankin (Stroke Patients Only)       Balance Overall balance assessment: Needs assistance Sitting-balance support: Feet supported;No upper extremity supported Sitting balance-[name]: Fair Sitting balance - Comments: Initially Min A progressing to min guard.   Standing balance support: During functional activity Standing balance-[name]: Poor Standing balance comment: Requires external support vs RW.                             Pertinent Vitals/Pain Pain Assessment: Faces Faces Pain Scale: Hurts a little bit Pain Location: head Pain Descriptors / Indicators: Sore Pain Intervention(s): Monitored during session    Home Living Family/patient expects to be discharged to:: Private residence Living Arrangements: Spouse/significant other;Other relatives (2 grandkids, 39 and 44 y0) Available Help at Discharge: Family;Available 24 hours/day (spouse works 3 days/week) Type of Home: House Home Access: Stairs to enter Entrance Stairs-Rails: None Technical brewer of Steps: 4 Home Layout: Two level;1/2 bath on main level;Bed/bath upstairs Home Equipment: Bedside commode;Wheelchair - [employer] - 2 wheels Additional Comments: bedroom moved to first level recently.    Prior Function Level of Independence: Needs assistance   Gait / Transfers Assistance Needed: Using w/c for mobility. Limited due to vertigo. Walks short distances with RW on  a good day but not recently.  ADL's / Homemaking Assistance Needed: Spouse helps with getting in/out of shower. Dresses self sitting.  Comments: Had a fall prior to admission in which she bumped her head on the sink.     Hand Dominance   Dominant Hand:  Right    Extremity/Trunk Assessment   Upper Extremity Assessment Upper Extremity Assessment: Defer to OT evaluation    Lower Extremity Assessment Lower Extremity Assessment: LLE deficits/detail;RLE deficits/detail RLE Deficits / Details: Not able to lift against gravity from chair. Decreased strength in abd/adduction as noted during bed mobility. RLE Sensation: decreased light touch (sporatic distribution) LLE Deficits / Details: Residual weakness from prior CVA; not able to lift against gravity. LLE Sensation: decreased light touch (sporatic distribution)    Cervical / Trunk Assessment Cervical / Trunk Assessment: Normal  Communication   Communication: No difficulties  Cognition Arousal/Alertness: Awake/alert Behavior During Therapy: WFL for tasks assessed/performed Overall Cognitive Status: Impaired/Different from baseline Area of Impairment: Memory;Problem solving                     Memory: Decreased short-term memory       Problem Solving: Decreased initiation General Comments: pt does not recall events of day of admission until paramedics were standing over her, pt in urine soaked bed and had not had breakfast but had not called for assistance      General Comments General comments (skin integrity, edema, etc.): Incontinent of urine upon arrival.    Exercises     Assessment/Plan    PT Assessment Patient needs continued PT services  PT Problem List Decreased strength;Decreased mobility;Impaired sensation;Decreased balance;Decreased range of motion       PT Treatment Interventions Therapeutic activities;Gait training;Therapeutic exercise;Patient/family education;Wheelchair mobility training;Balance training;Functional mobility training;Neuromuscular re-education    PT Goals (Current goals can be found in the Care Plan section)  Acute Rehab PT Goals Patient Stated Goal: to regain strength, go home PT Goal Formulation: With patient Time For Goal  Achievement: 11/28/19 Potential to Achieve Goals: Fair    Frequency Min 3X/week   Barriers to discharge Decreased caregiver support      Co-evaluation   Reason for Co-Treatment: For patient/therapist safety   OT goals addressed during session: ADL's and self-care       AM-PAC PT "6 Clicks" Mobility  Outcome Measure Help needed turning from your back to your side while in a flat bed without using bedrails?: A Little Help needed moving from lying on your back to sitting on the side of a flat bed without using bedrails?: A Lot Help needed moving to and from a bed to a chair (including a wheelchair)?: A Lot Help needed standing up from a chair using your arms (e.g., wheelchair or bedside chair)?: A Lot Help needed to walk in hospital room?: A Lot Help needed climbing 3-5 steps with a railing? : Total 6 Click Score: 12    End of Session Equipment Utilized During Treatment: Gait belt Activity Tolerance: Patient tolerated treatment well Patient left: in chair;with call bell/phone within reach;with chair alarm set Nurse Communication: Mobility status PT Visit Diagnosis: Muscle weakness (generalized) (M62.81);Unsteadiness on feet (R26.81);Difficulty in walking, not elsewhere classified (R26.2)    Time: 2671-2458 PT Time Calculation (min) (ACUTE ONLY): 30 min   Charges:   PT Evaluation $PT Eval Moderate Complexity: 1 Mod          Rhonda [name], PT, DPT Acute Rehabilitation Services Pager [phone] Office [phone]  Rhonda [name] 11/14/2019, 1:45 PM

## 2019-11-14 NOTE — Evaluation (Signed)
Occupational Therapy Evaluation Patient Details Name: Rhonda [name] MRN: [id number] DOB: [birth year] Today's Date: 11/14/2019    History of Present Illness Pt is a 59 year old woman admitted 11/13/19 with AMS and agitation s/p fall with bump to the head. 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 PMH: HTN, prediabetes, CVA Jan 2021, COVID 06/2019, arthritis, vertigo.    Clinical Impression   Pt has been primarily functioning from a w/c level with assist to transfer. Pt attributes this to her vertigo. Husband assists her in and out for showering, but she can bathe and dress herself typically. Pt presents with generalized weakness, particularly LEs. She requires moderate assistance for bed level mobility and transfers and set up to total assist for ADL. Recommending intensive rehab in CIR prior to returning home. Will follow acutely.    Follow Up Recommendations  CIR;Supervision/Assistance - 24 hour    Equipment Recommendations  None recommended by OT    Recommendations for Other Services       Precautions / Restrictions Precautions Precautions: Fall Precaution Comments: pt with hx of vertigo, urinary incontinence      Mobility Bed Mobility Overal bed mobility: Needs Assistance Bed Mobility: Supine to Sit     Supine to sit: Mod assist     General bed mobility comments: increased time and effort, cues for technique, assist to raise trunk  Transfers Overall transfer level: Needs assistance Equipment used: Rolling walker (2 wheeled);2 person hand held assist Transfers: Sit to/from [facility] Sit to Stand: Mod assist Stand pivot transfers: Mod assist       General transfer comment: assist to rise and steady, heavily reliant on UEs to stand and can maintain only momentarily, stabilizes LEs on sitting surface, pulled recliner up behind pt after she stood from [place] Overall balance assessment: Needs assistance   Sitting  balance-[name]: Fair     Standing balance support: Bilateral upper extremity supported Standing balance-[name]: Poor                             ADL either performed or assessed with clinical judgement   ADL Overall ADL's : Needs assistance/impaired Eating/Feeding: Independent;Sitting   Grooming: Wash/dry hands;Wash/dry face;Sitting;Set up   Upper Body Bathing: Minimal assistance;Sitting   Lower Body Bathing: Total assistance;Sit to/from stand   Upper Body Dressing : Minimal assistance;Sitting   Lower Body Dressing: Total assistance;Sit to/from stand   Toilet Transfer: Moderate assistance;Stand-pivot;BSC   Toileting- Clothing Manipulation and Hygiene: Total assistance;Sit to/from stand               Vision Patient Visual Report: Blurring of vision (In L eye--since her stroke) Additional Comments: pt keeps L eye closed     Perception     Praxis      Pertinent Vitals/Pain Pain Assessment: Faces Faces Pain Scale: Hurts a little bit Pain Location: head Pain Descriptors / Indicators: Sore Pain Intervention(s): Monitored during session     Hand Dominance Right   Extremity/Trunk Assessment Upper Extremity Assessment Upper Extremity Assessment: Overall WFL for tasks assessed   Lower Extremity Assessment Lower Extremity Assessment: Defer to PT evaluation   Cervical / Trunk Assessment Cervical / Trunk Assessment: Normal   Communication Communication Communication: No difficulties   Cognition Arousal/Alertness: Awake/alert Behavior During Therapy: WFL for tasks assessed/performed Overall Cognitive Status: Impaired/Different from baseline Area of Impairment: Memory;Problem solving  Memory: Decreased short-term memory       Problem Solving: Decreased initiation General Comments: pt does not recall events of day of admission until paramedics were standing over her, pt in urine soaked bed and had not had  breakfast but had not called for assistance   General Comments       Exercises     Shoulder Instructions      Home Living Family/patient expects to be discharged to:: Private residence Living Arrangements: Spouse/significant other;Other relatives (2 grandkids age 46 and 38) Available Help at Discharge: Family;Available 24 hours/day Type of Home: House Home Access: Stairs to enter [employer] of Steps: 4 Entrance Stairs-Rails: None Home Layout: Two level;1/2 bath on main level;Bed/bath upstairs Alternate Level Stairs-Number of Steps: flight Alternate Level Stairs-Rails: Right Bathroom Shower/Tub: Tub/shower unit;Curtain   Bathroom Toilet: Handicapped height     Home Equipment: Bedside commode;Wheelchair - [employer] - 2 wheels   Additional Comments: bedroom moved to first level recently.      Prior Functioning/Environment Level of Independence: Needs assistance  Gait / Transfers Assistance Needed: Using w/c for mobility. Limited due to vertigo. Walks short distances with RW on a good day but not recently. ADL's / Homemaking Assistance Needed: Spouse helps with getting in/out of shower. Dresses self sitting.   Comments: Had a fall prior to admission in which she bumped her head on the sink.        OT Problem List: Decreased strength;Decreased activity tolerance;Impaired balance (sitting and/or standing);Decreased cognition;Decreased knowledge of use of DME or AE;Impaired vision/perception      OT Treatment/Interventions: Self-care/ADL training;DME and/or AE instruction;Therapeutic activities;Patient/family education;Balance training;Cognitive remediation/compensation    OT Goals(Current goals can be found in the care plan section) Acute Rehab OT Goals Patient Stated Goal: to regain strength, go home OT Goal Formulation: With patient Time For Goal Achievement: 11/28/19 Potential to Achieve Goals: Good ADL Goals Pt Will Perform Upper Body Bathing: with  supervision;sitting Pt Will Perform Lower Body Bathing: sitting/lateral leans;with min assist;with adaptive equipment Pt Will Perform Upper Body Dressing: with supervision;sitting Pt Will Perform Lower Body Dressing: with min assist;sit to/from stand;with adaptive equipment Pt Will Transfer to Toilet: with supervision;stand pivot transfer;bedside commode Pt Will Perform Toileting - Clothing Manipulation and hygiene: with supervision;sitting/lateral leans;sit to/from stand Additional ADL Goal #1: Pt will perform bed mobility with min assist in preparation for ADL.  OT Frequency: Min 2X/week   Barriers to D/C:            Co-evaluation PT/OT/SLP Co-Evaluation/Treatment: Yes Reason for Co-Treatment: For patient/therapist safety   OT goals addressed during session: ADL's and self-care      AM-PAC OT "6 Clicks" Daily Activity     Outcome Measure Help from another person eating meals?: None Help from another person taking care of personal grooming?: A Little Help from another person toileting, which includes using toliet, bedpan, or urinal?: Total Help from another person bathing (including washing, rinsing, drying)?: A Lot Help from another person to put on and taking off regular upper body clothing?: A Little Help from another person to put on and taking off regular lower body clothing?: Total 6 Click Score: 14   End of Session Equipment Utilized During Treatment: Rolling walker Nurse Communication: Mobility status  Activity Tolerance: Patient tolerated treatment well Patient left: in chair;with call bell/phone within reach;with chair alarm set  OT Visit Diagnosis: Unsteadiness on feet (R26.81);Other abnormalities of gait and mobility (R26.89);Dizziness and giddiness (R42);Muscle weakness (generalized) (M62.81);Other symptoms and signs involving cognitive function  Time: 0052-5910 OT Time Calculation (min): 30 min Charges:  OT General Charges $OT Visit: 1 Visit OT  Evaluation $OT Eval Moderate Complexity: 1 Mod  Nestor Lewandowsky, OTR/L Acute Rehabilitation Services Pager: [phone] Office: [phone]  Malka So 11/14/2019, 12:52 PM

## 2019-11-14 NOTE — Progress Notes (Signed)
  Echocardiogram 2D Echocardiogram has been performed.  Rhonda [name] 11/14/2019, 3:59 PM

## 2019-11-14 NOTE — Procedures (Signed)
Indication: Sarcoidosis versus malignantcy  Risks of the procedure were dicussed with the patient including post-LP headache, bleeding, infection, weakness/numbness of legs(radiculopathy), death.  The patient agreed and written consent was obtained.   The patient was prepped and draped, and using sterile technique a 20 gauge quinke spinal needle was inserted in the L3-4 space.  Approximately 24 cc of CSF were obtained and sent for analysis. No complications were encountered.    Etta Quill PA-C [facility] [phone]  M-F  (9:00 am- 5:00 PM)  11/14/2019, 11:08 AM

## 2019-11-14 NOTE — Plan of Care (Signed)
Poc progressing.  

## 2019-11-14 NOTE — Progress Notes (Signed)
Subjective: Weakness of BLE has worsened since yesterday. Now no longer able to elevate antigravity.   Objective: Current vital signs: BP (!) 137/92 (BP Location: Right Arm)   Pulse 95   Temp 98.3 F (36.8 C) (Oral)   Resp 14   Wt 74 kg   SpO2 100%   BMI 28.90 kg/m  Vital signs in last 24 hours: Temp:  [98 F (36.7 C)-99.4 F (37.4 C)] 98.3 F (36.8 C) (06/28 0730) Pulse Rate:  [91-103] 95 (06/28 0730) Resp:  [14-27] 14 (06/28 0730) BP: (107-137)/(76-95) 137/92 (06/28 0730) SpO2:  [96 %-100 %] 100 % (06/28 0730) Weight:  [74 kg] 74 kg (06/28 0500)  Intake/Output from previous day: 06/27 0701 - 06/28 0700 In: 107.8 [P.O.:100; I.V.:7.8] Out: -  Intake/Output this shift: No intake/output data recorded. Nutritional status:  Diet Order            Diet Carb Modified Fluid consistency: Thin; Room service appropriate? Yes  Diet effective now                HEENT: [place]/AT Lungs: Respirations unlabored Ext: No edema. Old abrasions from prior falls are noted to BLE.   Neurologic Exam: Mental Status: Awake and alert. Oriented with intact comprehension, thought content appropriate. Speech fluent with intact comprehension.  Cranial Nerves: II:  Decreased visual acuity OS. Left pupil 4 mm and reactive. Right pupil 3 mm and reactive.  III,IV, VI: Able to track horizonatally and vertically. Exotropia with left lateral rectus paresis. Saccadic visual pursuits. Left sided ptosis.  V,VII: Temp sensation equal bilaterally. Right facial droop.   VIII: hearing intact to voice IX,X: Palate with weak elevation. Chronic hiccoughing noted (singultus) XI: Head is midline XII: midline tongue extension Motor: RUE 5/5 grip, 4+/5 proximal muscle strength LUE 5/5 grip, 4+/5 proximal muscle strength BLE: Unable to lift antigravity, but able  To move slightly with gravity eliminated Sensory: Hyperesthesia to temp LUE and LLE Decreased FT sensation BLE.  Deep Tendon Reflexes:  2+  brachioradialis and biceps bilaterally 1+ patellae bilaterally 0 achilles bilaterally Toes upgoing bilaterally Cerebellar: Some tremor but no ataxia with FNF bilaterally  Gait: Unable to assess due to BLE weakness  Lab Results: Results for orders placed or performed during the hospital encounter of 11/13/19 (from the past 48 hour(s))  Protime-INR     Status: None   Collection Time: 11/13/19  4:20 PM  Result Value Ref Range   Prothrombin Time 12.5 11.4 - 15.2 seconds   INR 1.0 0.8 - 1.2    Comment: (NOTE) INR goal varies based on device and disease states. Performed at [facility], [place] [address], [place], [place] [address]   APTT     Status: None   Collection Time: 11/13/19  4:20 PM  Result Value Ref Range   aPTT 29 24 - 36 seconds    Comment: Performed at [facility] [address], [place], [place] [address]  CBC     Status: Abnormal   Collection Time: 11/13/19  4:20 PM  Result Value Ref Range   WBC 12.0 (H) 4.0 - 10.5 K/uL   RBC 4.69 3.87 - 5.11 MIL/uL   Hemoglobin 12.5 12.0 - 15.0 g/dL   HCT 41.0 36 - 46 %   MCV 87.4 80.0 - 100.0 fL   MCH 26.7 26.0 - 34.0 pg   MCHC 30.5 30.0 - 36.0 g/dL   RDW 16.1 (H) 11.5 - 15.5 %   Platelets 347 150 - 400  K/uL   nRBC 0.0 0.0 - 0.2 %    Comment: Performed at [facility], [place] [address] [place], [place] [address]  Differential     Status: Abnormal   Collection Time: 11/13/19  4:20 PM  Result Value Ref Range   Neutrophils Relative % 81 %   Neutro Abs 9.7 (H) 1.7 - 7.7 K/uL   Lymphocytes Relative 10 %   Lymphs Abs 1.2 0.7 - 4.0 K/uL   Monocytes Relative 8 %   Monocytes Absolute 0.9 0 - 1 K/uL   Eosinophils Relative 0 %   Eosinophils Absolute 0.0 0 - 0 K/uL   Basophils Relative 0 %   Basophils Absolute 0.0 0 - 0 K/uL   Immature Granulocytes 1 %   Abs Immature Granulocytes 0.12 (H) 0.00 - 0.07 K/uL    Comment: Performed at [facility] [address] [place], [place] [address]  Comprehensive  metabolic panel     Status: Abnormal   Collection Time: 11/13/19  4:20 PM  Result Value Ref Range   Sodium 132 (L) 135 - 145 mmol/L   Potassium 3.9 3.5 - 5.1 mmol/L   Chloride 93 (L) 98 - 111 mmol/L   CO2 28 22 - 32 mmol/L   Glucose, Bld 100 (H) 70 - 99 mg/dL    Comment: Glucose reference range applies only to samples taken after fasting for at least 8 hours.   BUN 12 6 - 20 mg/dL   Creatinine, Ser 0.95 0.44 - 1.00 mg/dL   Calcium 9.5 8.9 - 10.3 mg/dL   Total Protein 7.1 6.5 - 8.1 g/dL   Albumin 3.1 (L) 3.5 - 5.0 g/dL   AST 87 (H) 15 - 41 U/L   ALT 54 (H) 0 - 44 U/L   Alkaline Phosphatase 60 38 - 126 U/L   Total Bilirubin 0.5 0.3 - 1.2 mg/dL   GFR calc non Af Amer >60 >60 mL/min   GFR calc Af Amer >60 >60 mL/min   Anion gap 11 5 - 15    Comment: Performed at [facility] [address] [place], [place] [address]  CBG monitoring, ED     Status: None   Collection Time: 11/13/19  4:20 PM  Result Value Ref Range   Glucose-Capillary 97 70 - 99 mg/dL    Comment: Glucose reference range applies only to samples taken after fasting for at least 8 hours.   Comment 1 Notify RN    Comment 2 Document in Chart   I-stat chem 8, ED     Status: Abnormal   Collection Time: 11/13/19  4:29 PM  Result Value Ref Range   Sodium 133 (L) 135 - 145 mmol/L   Potassium 4.1 3.5 - 5.1 mmol/L   Chloride 93 (L) 98 - 111 mmol/L   BUN 15 6 - 20 mg/dL   Creatinine, Ser 1.00 0.44 - 1.00 mg/dL   Glucose, Bld 99 70 - 99 mg/dL    Comment: Glucose reference range applies only to samples taken after fasting for at least 8 hours.   Calcium, Ion 1.04 (L) 1.15 - 1.40 mmol/L   TCO2 33 (H) 22 - 32 mmol/L   Hemoglobin 13.9 12.0 - 15.0 g/dL   HCT 41.0 36 - 46 %  SARS Coronavirus 2 by RT PCR (hospital order, performed in [facility] hospital lab) Nasopharyngeal Nasopharyngeal Swab     Status: None   Collection Time: 11/13/19 10:38 PM   Specimen: Nasopharyngeal Swab  Result Value Ref Range   SARS Coronavirus 2  NEGATIVE NEGATIVE    Comment: (NOTE) SARS-CoV-2 target nucleic acids are NOT DETECTED.  The SARS-CoV-2 RNA is generally detectable in upper and lower respiratory specimens during the acute phase of infection. The lowest concentration of SARS-CoV-2 viral copies this assay can detect is 250 copies / mL. A negative result does not preclude SARS-CoV-2 infection and should not be used as the sole basis for treatment or other patient management decisions.  A negative result may occur with improper specimen collection / handling, submission of specimen other than nasopharyngeal swab, presence of viral mutation(s) within the areas targeted by this assay, and inadequate number of viral copies (<250 copies / mL). A negative result must be combined with clinical observations, patient history, and epidemiological information.  Fact Sheet for Patients:   StrictlyIdeas.no  Fact Sheet for Healthcare Providers: BankingDealers.co.za  This test is not yet approved or  cleared by the [place] FDA and has been authorized for detection and/or diagnosis of SARS-CoV-2 by FDA under an Emergency Use Authorization (EUA).  This EUA will remain in effect (meaning this test can be used) for the duration of the COVID-19 declaration under Section 564(b)(1) of the Act, 21 U.S.C. section 360bbb-3(b)(1), unless the authorization is terminated or revoked sooner.  Performed at [facility], [place] [address], [place], [place] [address]   Sedimentation rate     Status: Abnormal   Collection Time: 11/14/19  1:09 AM  Result Value Ref Range   Sed Rate 64 (H) 0 - 22 mm/hr    Comment: Performed at [facility] [address] [place], [place] [address]  C-reactive protein     Status: None   Collection Time: 11/14/19  1:09 AM  Result Value Ref Range   CRP 0.6 <1.0 mg/dL    Comment: Performed at [facility] [address], [place], [place] [address]   HIV Antibody (routine testing w rflx)     Status: None   Collection Time: 11/14/19  1:09 AM  Result Value Ref Range   HIV Screen 4th Generation wRfx Non Reactive Non Reactive    Comment: Performed at [facility], [place] [address], [place], [place] [address]  Hepatitis C antibody     Status: None   Collection Time: 11/14/19  1:09 AM  Result Value Ref Range   HCV Ab NON REACTIVE NON REACTIVE    Comment: (NOTE) Nonreactive HCV antibody screen is consistent with no HCV infections,  unless recent infection is suspected or other evidence exists to indicate HCV infection.  Performed at [facility], [place] [address], [place], [place] [address]   TSH     Status: None   Collection Time: 11/14/19  1:09 AM  Result Value Ref Range   TSH 2.037 0.350 - 4.500 uIU/mL    Comment: Performed by a 3rd Generation assay with a functional sensitivity of <=0.01 uIU/mL. Performed at [facility], [place] [address] [place], [place] [address]   Comprehensive metabolic panel     Status: Abnormal   Collection Time: 11/14/19  1:09 AM  Result Value Ref Range   Sodium 133 (L) 135 - 145 mmol/L   Potassium 4.0 3.5 - 5.1 mmol/L   Chloride 95 (L) 98 - 111 mmol/L   CO2 29 22 - 32 mmol/L   Glucose, Bld 150 (H) 70 - 99 mg/dL    Comment: Glucose reference range applies only to samples  taken after fasting for at least 8 hours.   BUN 12 6 - 20 mg/dL   Creatinine, Ser 0.94 0.44 - 1.00 mg/dL   Calcium 9.3 8.9 - 10.3 mg/dL   Total Protein 7.2 6.5 - 8.1 g/dL   Albumin 3.0 (L) 3.5 - 5.0 g/dL   AST 74 (H) 15 - 41 U/L   ALT 52 (H) 0 - 44 U/L   Alkaline Phosphatase 60 38 - 126 U/L   Total Bilirubin 0.7 0.3 - 1.2 mg/dL   GFR calc non Af Amer >60 >60 mL/min   GFR calc Af Amer >60 >60 mL/min   Anion gap 9 5 - 15    Comment: Performed at [facility] [address], [place], [place] [address]  CBC     Status: Abnormal   Collection Time: 11/14/19  1:09 AM  Result Value Ref Range   WBC 11.2 (H) 4.0 -  10.5 K/uL   RBC 4.57 3.87 - 5.11 MIL/uL   Hemoglobin 12.4 12.0 - 15.0 g/dL   HCT 39.0 36 - 46 %   MCV 85.3 80.0 - 100.0 fL   MCH 27.1 26.0 - 34.0 pg   MCHC 31.8 30.0 - 36.0 g/dL   RDW 16.2 (H) 11.5 - 15.5 %   Platelets 339 150 - 400 K/uL   nRBC 0.0 0.0 - 0.2 %    Comment: Performed at [facility], [place] [address], [place], [place] [address]  Troponin I (High Sensitivity)     Status: Abnormal   Collection Time: 11/14/19  1:09 AM  Result Value Ref Range   Troponin I (High Sensitivity) 20 (H) <18 ng/L    Comment: (NOTE) Elevated high sensitivity troponin I (hsTnI) values and significant  changes across serial measurements may suggest ACS but many other  chronic and acute conditions are known to elevate hsTnI results.  Refer to the "Links" section for chest pain algorithms and additional  guidance. Performed at [facility], [place] [address], [place], [place] [address]   Vitamin B12     Status: None   Collection Time: 11/14/19  1:09 AM  Result Value Ref Range   Vitamin B-12 869 180 - 914 pg/mL    Comment: (NOTE) This assay is not validated for testing neonatal or myeloproliferative syndrome specimens for Vitamin B12 levels. Performed at [facility], [place] [address] [place], [place] [address]   Folate, serum, performed at [facility] lab     Status: None   Collection Time: 11/14/19  1:09 AM  Result Value Ref Range   Folate 32.8 >5.9 ng/mL    Comment: Performed at [facility], [place] [address] [place], [place] [address]  Hemoglobin A1c     Status: Abnormal   Collection Time: 11/14/19  1:09 AM  Result Value Ref Range   Hgb A1c MFr Bld 5.9 (H) 4.8 - 5.6 %    Comment: (NOTE) Pre diabetes:          5.7%-6.4%  Diabetes:              >6.4%  Glycemic control for   <7.0% adults with diabetes    Mean Plasma Glucose 122.63 mg/dL    Comment: Performed at [facility] [address] [place],  [address]  Glucose, capillary     Status: None    Collection Time: 11/14/19  6:09 AM  Result Value Ref Range   Glucose-Capillary 84 70 - 99 mg/dL  Comment: Glucose reference range applies only to samples taken after fasting for at least 8 hours.   Comment 1 Notify RN     Recent Results (from the past 240 hour(s))  SARS Coronavirus 2 by RT PCR (hospital order, performed in [facility] hospital lab) Nasopharyngeal Nasopharyngeal Swab     Status: None   Collection Time: 11/13/19 10:38 PM   Specimen: Nasopharyngeal Swab  Result Value Ref Range Status   SARS Coronavirus 2 NEGATIVE NEGATIVE Final    Comment: (NOTE) SARS-CoV-2 target nucleic acids are NOT DETECTED.  The SARS-CoV-2 RNA is generally detectable in upper and lower respiratory specimens during the acute phase of infection. The lowest concentration of SARS-CoV-2 viral copies this assay can detect is 250 copies / mL. A negative result does not preclude SARS-CoV-2 infection and should not be used as the sole basis for treatment or other patient management decisions.  A negative result may occur with improper specimen collection / handling, submission of specimen other than nasopharyngeal swab, presence of viral mutation(s) within the areas targeted by this assay, and inadequate number of viral copies (<250 copies / mL). A negative result must be combined with clinical observations, patient history, and epidemiological information.  Fact Sheet for Patients:   StrictlyIdeas.no  Fact Sheet for Healthcare Providers: BankingDealers.co.za  This test is not yet approved or  cleared by the [place] FDA and has been authorized for detection and/or diagnosis of SARS-CoV-2 by FDA under an Emergency Use Authorization (EUA).  This EUA will remain in effect (meaning this test can be used) for the duration of the COVID-19 declaration under Section 564(b)(1) of the Act, 21 U.S.C. section 360bbb-3(b)(1), unless the authorization is  terminated or revoked sooner.  Performed at [facility], [place] [address], [place], [place] [address]     Lipid Panel No results for input(s): CHOL, TRIG, HDL, CHOLHDL, VLDL, LDLCALC in the last 72 hours.  Studies/Results: DG Chest 2 View  Result Date: 11/13/2019 CLINICAL DATA:  [place] this morning, altered level of consciousness EXAM: CHEST - 2 VIEW COMPARISON:  05/23/2019 FINDINGS: The heart size and mediastinal contours are within normal limits. Both lungs are clear. The visualized skeletal structures are unremarkable. IMPRESSION: No active cardiopulmonary disease. Electronically Signed   By: Randa Ngo M.D.   On: 11/13/2019 22:36   CT CERVICAL SPINE WO CONTRAST  Result Date: 11/13/2019 CLINICAL DATA:  Fall EXAM: CT CERVICAL SPINE WITHOUT CONTRAST TECHNIQUE: Multidetector CT imaging of the cervical spine was performed without intravenous contrast. Multiplanar CT image reconstructions were also generated. COMPARISON:  None. FINDINGS: Alignment: Mild reversal the normal cervical lordosis, likely positional. No spondylolisthesis. Skull base and vertebrae: No acute fracture. No primary bone lesion or focal pathologic process. Soft tissues and spinal canal: No prevertebral fluid or swelling. No visible canal hematoma. Disc levels: Moderate loss of disc height from C3-C4 through C6-C7. Endplate spurring and mild spondylotic disc bulging at these levels. No convincing disc herniation. Upper chest: No acute findings.  Clear lung apices. Other: None. IMPRESSION: 1. No fracture or acute finding. Electronically Signed   By: Lajean Manes M.D.   On: 11/13/2019 16:46   MR BRAIN WO CONTRAST  Result Date: 11/13/2019 CLINICAL DATA:  Left-sided weakness EXAM: MRI HEAD WITHOUT CONTRAST TECHNIQUE: Multiplanar, multiecho pulse sequences of the brain and surrounding structures were obtained without intravenous contrast. COMPARISON:  08/04/2019 FINDINGS: Brain: No acute infarct, acute hemorrhage or  extra-axial collection. There is hyperintense T2-weighted signal within the pons and medulla  oblongata, new since the prior study. Supratentorial white matter disease is also markedly worsened. No associated diffusion restriction. 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 Normal volume of CSF spaces. No chronic microhemorrhage. There is thickening of the pituitary infundibulum. Vascular: Normal flow voids. Skull and upper cervical spine: There is hyperintense T2-weighted signal visible within the upper spinal cord. Sinuses/Orbits: Negative. Other: None. IMPRESSION: 1. New white matter disease in the cerebrum, brainstem and visualized upper cervical spinal cord, thickening of the pituitary stalk. The primary differential considerations are neurosarcoidosis and acute demyelinating disease. 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 Electronically Signed   By: Ulyses Jarred M.D.   On: 11/13/2019 19:37   MR BRAIN W CONTRAST  Result Date: 11/14/2019 CLINICAL DATA:  Acute encephalopathy EXAM: MRI HEAD WITH CONTRAST TECHNIQUE: Multiplanar, multiecho pulse sequences of the brain and surrounding structures were obtained with intravenous contrast. CONTRAST:  9.31mL GADAVIST GADOBUTROL 1 MMOL/ML IV SOLN COMPARISON:  None. FINDINGS: There is a large amount of leptomeningeal contrast enhancement within the posterior fossa and along the brainstem. Supratentorial leptomeningeal contrast enhancement is greatest along the inter hemispheric fissure and near the circle-of-Willis. The infundibulum is markedly thickened. Enhancement extends along the cisternal segments of cranial nerves 5, 7 and 8. The smaller lower cranial nerves are difficult to visualize on this study. IMPRESSION: Extensive leptomeningeal contrast enhancement favoring the posterior fossa and brainstem with thickening of the  infundibulum. This is most typical of neurosarcoid. Other forms of inflammatory/infectious leptomeningitis remain possible, but are less likely. Electronically Signed   By: Ulyses Jarred M.D.   On: 11/14/2019 00:53   MR Cervical Spine W or Wo Contrast  Result Date: 11/14/2019 CLINICAL DATA:  Encephalopathy and weakness. EXAM: MRI CERVICAL SPINE WITHOUT AND WITH CONTRAST MRI THORACIC SPINE WITHOUT AND WITH CONTRAST TECHNIQUE: Multiplanar and multiecho pulse sequences of the cervical spine, to include the craniocervical junction and cervicothoracic junction, were obtained without and with intravenous contrast. CONTRAST:  9.29mL GADAVIST GADOBUTROL 1 MMOL/ML IV SOLN COMPARISON:  None. FINDINGS: CERVICAL SPINE FINDINGS Alignment: Physiologic. Vertebrae: Heterogeneous bone marrow signal without focal lesion. Cord: There is hyperintense T2-weighted signal within the cord at the cervicomedullary junction and C2 level. There is leptomeningeal contrast enhancement extending the length of the cervical spine, greatest at the cervicomedullary junction and the C2 and C3 levels. Posterior Fossa, vertebral arteries, paraspinal tissues: Posterior fossa is evaluated separately on brain MRI. No prevertebral effusion. Normal flow voids. Disc levels: C2-3: Negative C3-4: Small disc bulge without spinal canal stenosis. C4-5: Small diffuse disc bulge.  No spinal canal stenosis. C5-6: Left foraminal disc protrusion and uncovertebral hypertrophy with severe left foraminal stenosis. C6-7: Mild disc bulge without spinal canal stenosis. C7-T1: Negative THORACIC SPINE FINDINGS Alignment: Physiologic. Vertebrae: No fracture, evidence of discitis, or bone lesion. Cord: There is multifocal leptomeningeal enhancement along the length of the thoracic spinal cord. Slightly less extensive involvement of the thoracic spine and the cervical spine. Paraspinal tissues: Negative. Disc levels: No disc herniation or stenosis. IMPRESSION: 1. Multifocal  leptomeningeal contrast enhancement along the length of the cervical and thoracic spinal cord, compatible with neurosarcoid. Other forms of inflammatory or infectious encephalomyelitis are considered less likely. 2. No cervical or thoracic spinal canal stenosis. 3. 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 Electronically Signed   By: Ulyses Jarred M.D.   On:  11/14/2019 00:46   MR THORACIC SPINE W WO CONTRAST  Result Date: 11/14/2019 CLINICAL DATA:  Encephalopathy and weakness. EXAM: MRI CERVICAL SPINE WITHOUT AND WITH CONTRAST MRI THORACIC SPINE WITHOUT AND WITH CONTRAST TECHNIQUE: Multiplanar and multiecho pulse sequences of the cervical spine, to include the craniocervical junction and cervicothoracic junction, were obtained without and with intravenous contrast. CONTRAST:  9.78mL GADAVIST GADOBUTROL 1 MMOL/ML IV SOLN COMPARISON:  None. FINDINGS: CERVICAL SPINE FINDINGS Alignment: Physiologic. Vertebrae: Heterogeneous bone marrow signal without focal lesion. Cord: There is hyperintense T2-weighted signal within the cord at the cervicomedullary junction and C2 level. There is leptomeningeal contrast enhancement extending the length of the cervical spine, greatest at the cervicomedullary junction and the C2 and C3 levels. Posterior Fossa, vertebral arteries, paraspinal tissues: Posterior fossa is evaluated separately on brain MRI. No prevertebral effusion. Normal flow voids. Disc levels: C2-3: Negative C3-4: Small disc bulge without spinal canal stenosis. C4-5: Small diffuse disc bulge.  No spinal canal stenosis. C5-6: Left foraminal disc protrusion and uncovertebral hypertrophy with severe left foraminal stenosis. C6-7: Mild disc bulge without spinal canal stenosis. C7-T1: Negative THORACIC SPINE FINDINGS Alignment: Physiologic. Vertebrae: No fracture, evidence of discitis, or bone lesion. Cord: There is multifocal leptomeningeal enhancement along the  length of the thoracic spinal cord. Slightly less extensive involvement of the thoracic spine and the cervical spine. Paraspinal tissues: Negative. Disc levels: No disc herniation or stenosis. IMPRESSION: 1. Multifocal leptomeningeal contrast enhancement along the length of the cervical and thoracic spinal cord, compatible with neurosarcoid. Other forms of inflammatory or infectious encephalomyelitis are considered less likely. 2. No cervical or thoracic spinal canal stenosis. 3. 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 Electronically Signed   By: Ulyses Jarred M.D.   On: 11/14/2019 00:46   CT HEAD CODE STROKE WO CONTRAST  Result Date: 11/13/2019 CLINICAL DATA:  Code stroke.  Altered mental status EXAM: CT HEAD WITHOUT CONTRAST TECHNIQUE: Contiguous axial images were obtained from the base of the skull through the vertex without intravenous contrast. COMPARISON:  08/04/2019 FINDINGS: Brain: There is no acute intracranial hemorrhage, mass effect, or edema. No new loss of gray-white differentiation. Ventricles and sulci are normal in size and configuration. Patchy hypoattenuation in the supratentorial white matter is nonspecific but probably reflects stable chronic microvascular ischemic changes. 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 No extra-axial fluid collection. Vascular: No hyperdense vessel. Mild intracranial atherosclerotic calcification is present at the skull base. Skull: Unremarkable Sinuses/Orbits: Aerated.  Orbits are unremarkable. Other: Opacification of the inferior right mastoid air cells. ASPECTS ([place] Stroke Program Early CT Score) - Ganglionic level infarction (caudate, lentiform nuclei, internal capsule, insula, M1-M3 cortex): 7 - Supraganglionic infarction (M4-M6 cortex): 3 Total score (0-10 with 10 being normal): 10 IMPRESSION: No acute intracranial hemorrhage or evidence of acute infarction. ASPECT  score is 10. Stable chronic findings detailed above. These results were communicated to Dr. Lorraine Lax at 4:34 pmon 6/27/2021by text page via the [facility] messaging system. Electronically Signed   By: Macy Mis M.D.   On: 11/13/2019 16:36    Medications:  Scheduled: . sodium chloride flush  3 mL Intravenous Once  . sodium chloride flush  3 mL Intravenous Q12H   Continuous: . 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 MRI brain revealed new white matter disease in  the cerebrum, brainstem and visualized upper cervical spinal cord, as well as thickening of the pituitary stalk. Follow up MRI brain with contrast, as well as cervical and thoracic MRI with and without contrast, revealed diffuse and multifocal leptomenengeal nodular enhancement.   1. 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 Given lack of fever, infectious etiology is unlikely.  2. Exam reveals multifocal motor and sensory findings in a patchy distribution, consistent with the multifocal abnormalities seen on MRI.   Recommendations: 1. LP. Obtain cell count with differential, protein, glucose, ACE, IgG index, flow cytometry, cytology, cryptococcal antigen, gram stain, bacterial culture and fungal culture.  2. Start high dose IV methylprednisolone, 1000 mg qd x 5 days. 3. After completion of IV steroids, start prednisone 1 to 1.5 mg/kg per day for two to four weeks and then start a slow taper. Per UpToDate: "A slow prednisone taper should be considered in patients who are severely compromised or for whom a chronic course is likely. One useful approach is to allow the patient to stabilize or improve with four weeks of treatment and then to decrease the prednisone dose by 5 mg every two weeks as tolerated by symptoms.  Exacerbations are most common when a prednisone dose of 10 mg/day is approached; thus, the dose should be decreased by 1 mg decrements every one to two weeks when the dose is near this level." 4. Thin-slice CT of chest with and without contrast to assess for possible subclinical pulmonary sarcoidosis.  5. Serum ACE level 6. CT of abdomen and pelvis with and without contrast to assess for possible primary malignancy 7. Will need follow up MRI brain and cervical spinal cord with and without contrast to assess for radiological improvement after initial steroid treatment.    LOS: 1 day   @Electronically  signed: Dr. Kerney Elbe 11/14/2019  10:09 AM

## 2019-11-14 NOTE — Progress Notes (Signed)
Pt received from ED, AO x4. Denies any pain. Hiccupping stated since yesterday. Breathing even and unlabored in RA. Vitals stable. CHG bath completed.  Call bell within reach, bed alarm on, high fall risk.  Will continue to monitor.

## 2019-11-14 NOTE — Progress Notes (Signed)
  Echocardiogram 2D Echocardiogram was attempted but the the patient wanted to eat. We will try again as the schedule permits.   Jennette Dubin 11/14/2019, 2:38 PM

## 2019-11-15 DIAGNOSIS — D8689 Sarcoidosis of other sites: Secondary | ICD-10-CM

## 2019-11-15 DIAGNOSIS — R269 Unspecified abnormalities of gait and mobility: Secondary | ICD-10-CM

## 2019-11-15 DIAGNOSIS — G379 Demyelinating disease of central nervous system, unspecified: Secondary | ICD-10-CM

## 2019-11-15 LAB — CSF IGG: IgG, CSF: 3.7 mg/dL (ref 0.0–6.7)

## 2019-11-15 LAB — SURGICAL PATHOLOGY

## 2019-11-15 LAB — BASIC METABOLIC PANEL
Anion gap: 9 (ref 5–15)
BUN: 7 mg/dL (ref 6–20)
CO2: 27 mmol/L (ref 22–32)
Calcium: 8.7 mg/dL — ABNORMAL LOW (ref 8.9–10.3)
Chloride: 95 mmol/L — ABNORMAL LOW (ref 98–111)
Creatinine, Ser: 0.75 mg/dL (ref 0.44–1.00)
GFR calc Af Amer: >60 mL/min (ref >60)
GFR calc non Af Amer: >60 mL/min (ref >60)
Glucose, Bld: 96 mg/dL (ref 70–99)
Potassium: 3.9 mmol/L (ref 3.5–5.1)
Sodium: 131 mmol/L — ABNORMAL LOW (ref 135–145)

## 2019-11-15 LAB — ANGIOTENSIN CONVERTING ENZYME: Angiotensin-Converting Enzyme: 15 U/L (ref 14–82)

## 2019-11-15 LAB — RHEUMATOID FACTOR: Rheumatoid fact SerPl-aCnc: <10 IU/mL (ref 0.0–13.9)

## 2019-11-15 LAB — CYTOLOGY - NON PAP

## 2019-11-15 LAB — GLUCOSE, CAPILLARY: Glucose-Capillary: 101 mg/dL — ABNORMAL HIGH (ref 70–99)

## 2019-11-15 MED ORDER — MENTHOL 3 MG MT LOZG
1.0000 | LOZENGE | OROMUCOSAL | Status: DC | PRN
  Filled 2019-11-15: qty 9

## 2019-11-15 MED ORDER — BENZONATATE 100 MG PO CAPS
100.0000 mg | ORAL_CAPSULE | Freq: Three times a day (TID) | ORAL | Status: DC
  Administered 2019-11-15 – 2019-11-28 (×39): 100 mg via ORAL
  Filled 2019-11-15 (×38): qty 1

## 2019-11-15 MED ORDER — SODIUM CHLORIDE 0.9 % IV SOLN
1000.0000 mg | Freq: Every day | INTRAVENOUS | Status: AC
  Administered 2019-11-15 – 2019-11-19 (×5): 1000 mg via INTRAVENOUS
  Filled 2019-11-15 (×5): qty 8

## 2019-11-15 NOTE — Progress Notes (Signed)
Rehab Admissions Coordinator Note:  Patient was screened by Cleatrice Burke for appropriateness for an Inpatient Acute Rehab Consult per therapy recs. .  At this time, we are recommending Inpatient Rehab consult. I will place order per protocol.  Cleatrice Burke RN MSN 11/15/2019, 11:18 AM  I can be reached at [phone].

## 2019-11-15 NOTE — Evaluation (Signed)
Speech Language Pathology Evaluation Patient Details Name: Rhonda [name] MRN: [id number] DOB: Mar 08, 1961 Today's Date: 11/15/2019 Time: 2505-3976 SLP Time Calculation (min) (ACUTE ONLY): 28 min  Problem List:  Patient Active Problem List   Diagnosis Date Noted  . Demyelinating changes in brain ([place]) 11/13/2019  . Acute CVA (cerebrovascular accident) ([place]) 05/23/2019  . Transaminitis 05/23/2019  . Nausea and vomiting 05/23/2019  . Leukocytosis 05/23/2019  . Thoracic lymphadenopathy 08/26/2018  . Axillary lymphadenopathy 08/26/2018  . Piriformis syndrome of right side 09/29/2016  . OSA (obstructive sleep apnea) 09/27/2015  . Right hip pain 04/11/2015  . Essential hypertension 11/07/2014  . Chronic low back pain 11/07/2014  . Prediabetes 11/07/2014  . Vitamin D deficiency 11/07/2014   Past Medical History:  Past Medical History:  Diagnosis Date  . Arthritis   . Degenerative disc disease, lumbar   . Hypertension   . Prediabetes    Past Surgical History:  Past Surgical History:  Procedure Laterality Date  . ABDOMINAL HYSTERECTOMY    . BREAST EXCISIONAL BIOPSY Right   . BREAST SURGERY     right breast  . CESAREAN SECTION     HPI:  Pt is a 59 year old woman admitted 11/13/19 with AMS and agitation s/p fall with bump to the head. 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 LP pending. PMH: Covid  Feb?, HTN, prediabetes, CVA Jan 2021, COVID 06/2019, arthritis, vertigo.   Assessment / Plan / Recommendation Clinical Impression  Pt exhibits cognitive impairment evident by informal interaction with pt and on the SLUMS assessment. She scored  14/30 which can be indicative of significant cognitive impairments. Most impaired subtests were working memory, attention, mental calculation and attention for backward number repetition. She repeated directions inaccurately for subtest frequently during  assessment indicative of decreased attention and immediate recall. Pt needed assist to organize using the menu to decide what to order for dinner. She states her dysarthria is from stroke in Jan and is not new. Suspect cognitive deficits prior but not sure how cognition compares prior to this admit. Continue ST and hopefully on inpatient rehab which she recalled is disposition.       SLP Assessment  SLP Recommendation/Assessment: Patient needs continued Speech Lanaguage Pathology Services    Follow Up Recommendations  Inpatient Rehab    Frequency and Duration min 2x/week  2 weeks      SLP Evaluation Cognition  Overall Cognitive Status: Impaired/Different from baseline Arousal/Alertness: Awake/alert Orientation Level: Oriented to person;Oriented to place;Disoriented to situation;Oriented to time Attention: Sustained Sustained Attention: Impaired Sustained Attention Impairment: Verbal basic;Functional basic Memory: Impaired Memory Impairment: Retrieval deficit Awareness: Impaired Awareness Impairment: Emergent impairment;Anticipatory impairment;Intellectual impairment Problem Solving: Impaired Problem Solving Impairment: Verbal basic;Functional basic Executive Function: Arboriculturist: Impaired       Comprehension  Auditory Comprehension Overall Auditory Comprehension: Appears within functional limits for tasks assessed Visual Recognition/Discrimination Discrimination: Not tested Reading Comprehension Reading Status:  (TBA)    Expression Expression Primary Mode of Expression: Verbal Verbal Expression Overall Verbal Expression: Appears within functional limits for tasks assessed (semantics questionable at times) Level of Generative/Spontaneous Verbalization: Financial controller Expression Dominant Hand: Right Written Expression:  (TBA)   Oral / Motor  Oral Motor/Sensory Function Overall Oral Motor/Sensory Function:  (will assess  further) Motor Speech Overall Motor Speech: Impaired (likely baseline) Respiration: Within functional limits Phonation: Normal Resonance: Within functional limits Articulation: Impaired Level of Impairment: Sentence Intelligibility: Intelligible Motor Planning: Witnin  functional limits   GO                    Houston Siren 11/15/2019, 4:33 PM Orbie Pyo Colvin Caroli.Ed Risk analyst [phone] Office [phone]

## 2019-11-15 NOTE — Progress Notes (Signed)
PROGRESS NOTE    Rhonda [name]  LNL:[id number] DOB: 1960/09/30 DOA: 11/13/2019 PCP: Bartholome Bill, MD    Brief Narrative:  Patient admitted to the hospital with a working diagnosis of acute demyelinating brain disease.  59 year old female who presented with altered mental status.  She has significant past medical history for hypertension, dyslipidemia, CVA (January/2021), COVID-19 (06/2019).  At home patient had an episode of vertigo, she fell and hit her head without loss of consciousness.  EMS evaluated patient and recommended outpatient follow-up.  The patient later on developed confusion and agitation.  EMS was called again and she was brought to the hospital.  On her initial physical examination temperature 99.4, blood pressure 114/81, heart rate 94, respiratory rate 21, oxygen saturation 95% on room air.  Her lungs are clear to auscultation bilaterally, heart S1-S2 present rhythmic, soft abdomen, no lower extremity edema.  She was neurologically nonfocal. Sodium 133, potassium 4.1, chloride 93, bicarb 28, glucose 98, BUN 15, creatinine 1.0, white count 12.0, hemoglobin 12.5, hematocrit 41.0, platelets 347.  SARS COVID-19 was negative.  Head CT no acute changes.  Brain MRI had changes suggestion of neurosarcoid. EKG 91 bpm, normal axis, normal intervals, sinus rhythm, ST depressions with II. 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 Demyelinating brain diease/ neuro sarcoid. Patient clinically has been improving no further confusion, but continue to be very weak and deconditioned. Had LP this admission with 110 wbc, <20 glucose and 592 protein.  PT OT recommends CIR. Neurology recommended to start the patient on high-dose steroids.  Currently day 1 of 1 g  Solu-Medrol.  Monitor response.  2. HTN. Continue blood pressure monitoring. Will dc IV fluids for now. Continue to hold on blood pressure medications for now to prevent hypotension.   3. Prediabetes. Close follow up of glucose.  4, Chronic back pain. Seems to be stable. Follow with PT and OT.     Status is: Inpatient  Remains inpatient appropriate because:Inpatient level of care appropriate due to severity of illness   Dispo: The patient is from: Home              Anticipated d/c is to: SNF              Anticipated d/c date is: 3 days              Patient currently is not medically stable to d/c.   DVT prophylaxis: Enoxaparin   Code Status:   full  Family Communication:  I spoke with patient's husband at the bedside, we talked in detail about patient's condition, plan of care and prognosis and all questions were addressed.     Consultants:   Neurology   Procedures:   LP   Subjective: No nausea or vomiting.  No fever no chills.  Still fatigue and tired.  Objective: Vitals:   11/15/19 0418 11/15/19 0736 11/15/19 1107 11/15/19 1500  BP: (!) 146/82 (!) 136/91 (!) 144/99 (!) 153/96  Pulse: 93 94 (!) 102 95  Resp: 16 16 17 17   Temp: 98.4 F (36.9 C) 98.3 F (36.8 C) 98.7 F (37.1 C) 98.2 F (36.8 C)  TempSrc: Oral Oral Oral Oral  SpO2: 100% 100% 100% 100%  Weight: 73.5 kg  Intake/Output Summary (Last 24 hours) at 11/15/2019 1918 Last data filed at 11/15/2019 1700 Gross per 24 hour  Intake 1621.32 ml  Output 2550 ml  Net -928.68 ml   Filed Weights   11/14/19 0500 11/15/19 0418  Weight: 74 kg 73.5 kg    Examination:   General: Not in pain or dyspnea, deconditioned  Neurology: Awake and alert.  Exotropia with left LR palsy and right facial droop.  Equal strength bilaterally. E ENT: mild pallor, no icterus, oral mucosa moist Cardiovascular: No JVD. S1-S2 present, rhythmic, no gallops, rubs, or murmurs. No lower extremity edema. Pulmonary: positive  breath sounds bilaterally, adequate air movement, no wheezing, rhonchi or rales. Gastrointestinal. Abdomen with no organomegaly, non tender, no rebound or guarding Skin. No rashes Musculoskeletal: no deformities.      Data Reviewed: I have personally reviewed following labs and imaging studies  CBC: Recent Labs  Lab 11/13/19 1620 11/13/19 1629 11/14/19 0109  WBC 12.0*  --  11.2*  NEUTROABS 9.7*  --   --   HGB 12.5 13.9 12.4  HCT 41.0 41.0 39.0  MCV 87.4  --  85.3  PLT 347  --  882   Basic Metabolic Panel: Recent Labs  Lab 11/13/19 1620 11/13/19 1629 11/14/19 0109 11/15/19 0339  NA 132* 133* 133* 131*  K 3.9 4.1 4.0 3.9  CL 93* 93* 95* 95*  CO2 28  --  29 27  GLUCOSE 100* 99 150* 96  BUN 12 15 12 7   CREATININE 0.95 1.00 0.94 0.75  CALCIUM 9.5  --  9.3 8.7*   GFR: CrCl cannot be calculated (Unknown ideal weight.). Liver Function Tests: Recent Labs  Lab 11/13/19 1620 11/14/19 0109  AST 87* 74*  ALT 54* 52*  ALKPHOS 60 60  BILITOT 0.5 0.7  PROT 7.1 7.2  ALBUMIN 3.1* 3.0*   No results for input(s): LIPASE, AMYLASE in the last 168 hours. No results for input(s): AMMONIA in the last 168 hours. Coagulation Profile: Recent Labs  Lab 11/13/19 1620  INR 1.0   Cardiac Enzymes: No results for input(s): CKTOTAL, CKMB, CKMBINDEX, TROPONINI in the last 168 hours. BNP (last 3 results) No results for input(s): PROBNP in the last 8760 hours. HbA1C: Recent Labs    11/14/19 0109  HGBA1C 5.9*   CBG: Recent Labs  Lab 11/13/19 1620 11/14/19 0609 11/15/19 0610  GLUCAP 97 84 101*   Lipid Profile: No results for input(s): CHOL, HDL, LDLCALC, TRIG, CHOLHDL, LDLDIRECT in the last 72 hours. Thyroid Function Tests: Recent Labs    11/14/19 0109  TSH 2.037   Anemia Panel: Recent Labs    11/14/19 0109  VITAMINB12 3  FOLATE 32.8      Radiology Studies: I have reviewed all of the imaging during this hospital visit personally     Scheduled Meds: .  benzonatate  100 mg Oral TID  . multivitamin with minerals  1 tablet Oral Daily  . pantoprazole  40 mg Oral TID AC  . sodium chloride flush  3 mL Intravenous Once  . sodium chloride flush  3 mL Intravenous Q12H   Continuous Infusions: . methylPREDNISolone (SOLU-MEDROL) injection 1,000 mg (11/15/19 1031)     LOS: 2 days        Berle Mull, MD

## 2019-11-15 NOTE — Progress Notes (Signed)
Subjective: No changes.   Objective: Current vital signs: BP (!) 136/91 (BP Location: Left Arm)   Pulse 94   Temp 98.3 F (36.8 C) (Oral)   Resp 16   Wt 73.5 kg   SpO2 100%   BMI 28.70 kg/m  Vital signs in last 24 hours: Temp:  [98 F (36.7 C)-99.2 F (37.3 C)] 98.3 F (36.8 C) (06/29 0736) Pulse Rate:  [93-100] 94 (06/29 0736) Resp:  [12-16] 16 (06/29 0736) BP: (130-150)/(82-95) 136/91 (06/29 0736) SpO2:  [99 %-100 %] 100 % (06/29 0736) Weight:  [73.5 kg] 73.5 kg (06/29 0418)  Intake/Output from previous day: 06/28 0701 - 06/29 0700 In: 1563.3 [P.O.:410; I.V.:1153.3] Out: 1650 [Urine:1650] Intake/Output this shift: No intake/output data recorded. Nutritional status:  Diet Order            Diet Carb Modified Fluid consistency: Thin; Room service appropriate? Yes  Diet effective now                HEENT: [place]/AT Lungs: Respirations unlabored Ext: No edema  Neurologic Exam: Unchanged from yesterday Ment: Alert and oriented. Speech was fluent.  CN: Continued exotropia with left lateral rectus paresis. Right facial droop.  Motor: 5/5 BUE. RLE continued paresis. LLE 5/5 Sensory: Intact to FT x 4.  Cerebellar: No gross ataxia Reflexes: Deferred due to joint pain Gait: Deferred   Lab Results: Results for orders placed or performed during the hospital encounter of 11/13/19 (from the past 48 hour(s))  Protime-INR     Status: None   Collection Time: 11/13/19  4:20 PM  Result Value Ref Range   Prothrombin Time 12.5 11.4 - 15.2 seconds   INR 1.0 0.8 - 1.2    Comment: (NOTE) INR goal varies based on device and disease states. Performed at [facility], [place] [address] [place], [place] [address]   APTT     Status: None   Collection Time: 11/13/19  4:20 PM  Result Value Ref Range   aPTT 29 24 - 36 seconds    Comment: Performed at [facility] [address] [place], [place] [address]  CBC     Status: Abnormal   Collection Time: 11/13/19  4:20 PM    Result Value Ref Range   WBC 12.0 (H) 4.0 - 10.5 K/uL   RBC 4.69 3.87 - 5.11 MIL/uL   Hemoglobin 12.5 12.0 - 15.0 g/dL   HCT 41.0 36 - 46 %   MCV 87.4 80.0 - 100.0 fL   MCH 26.7 26.0 - 34.0 pg   MCHC 30.5 30.0 - 36.0 g/dL   RDW 16.1 (H) 11.5 - 15.5 %   Platelets 347 150 - 400 K/uL   nRBC 0.0 0.0 - 0.2 %    Comment: Performed at [facility], [place] [address] [place], [place] [address]  Differential     Status: Abnormal   Collection Time: 11/13/19  4:20 PM  Result Value Ref Range   Neutrophils Relative % 81 %   Neutro Abs 9.7 (H) 1.7 - 7.7 K/uL   Lymphocytes Relative 10 %   Lymphs Abs 1.2 0.7 - 4.0 K/uL   Monocytes Relative 8 %   Monocytes Absolute 0.9 0 - 1 K/uL   Eosinophils Relative 0 %   Eosinophils Absolute 0.0 0 - 0 K/uL   Basophils Relative 0 %   Basophils Absolute 0.0 0 - 0 K/uL   Immature Granulocytes 1 %   Abs Immature Granulocytes 0.12 (H) 0.00 -  0.07 K/uL    Comment: Performed at [facility], [place] [address], [place], [place] [address]  Comprehensive metabolic panel     Status: Abnormal   Collection Time: 11/13/19  4:20 PM  Result Value Ref Range   Sodium 132 (L) 135 - 145 mmol/L   Potassium 3.9 3.5 - 5.1 mmol/L   Chloride 93 (L) 98 - 111 mmol/L   CO2 28 22 - 32 mmol/L   Glucose, Bld 100 (H) 70 - 99 mg/dL    Comment: Glucose reference range applies only to samples taken after fasting for at least 8 hours.   BUN 12 6 - 20 mg/dL   Creatinine, Ser 0.95 0.44 - 1.00 mg/dL   Calcium 9.5 8.9 - 10.3 mg/dL   Total Protein 7.1 6.5 - 8.1 g/dL   Albumin 3.1 (L) 3.5 - 5.0 g/dL   AST 87 (H) 15 - 41 U/L   ALT 54 (H) 0 - 44 U/L   Alkaline Phosphatase 60 38 - 126 U/L   Total Bilirubin 0.5 0.3 - 1.2 mg/dL   GFR calc non Af Amer >60 >60 mL/min   GFR calc Af Amer >60 >60 mL/min   Anion gap 11 5 - 15    Comment: Performed at [facility] [address], [place], [place] [address]  CBG monitoring, ED     Status: None   Collection Time: 11/13/19  4:20 PM   Result Value Ref Range   Glucose-Capillary 97 70 - 99 mg/dL    Comment: Glucose reference range applies only to samples taken after fasting for at least 8 hours.   Comment 1 Notify RN    Comment 2 Document in Chart   I-stat chem 8, ED     Status: Abnormal   Collection Time: 11/13/19  4:29 PM  Result Value Ref Range   Sodium 133 (L) 135 - 145 mmol/L   Potassium 4.1 3.5 - 5.1 mmol/L   Chloride 93 (L) 98 - 111 mmol/L   BUN 15 6 - 20 mg/dL   Creatinine, Ser 1.00 0.44 - 1.00 mg/dL   Glucose, Bld 99 70 - 99 mg/dL    Comment: Glucose reference range applies only to samples taken after fasting for at least 8 hours.   Calcium, Ion 1.04 (L) 1.15 - 1.40 mmol/L   TCO2 33 (H) 22 - 32 mmol/L   Hemoglobin 13.9 12.0 - 15.0 g/dL   HCT 41.0 36 - 46 %  SARS Coronavirus 2 by RT PCR (hospital order, performed in [facility] hospital lab) Nasopharyngeal Nasopharyngeal Swab     Status: None   Collection Time: 11/13/19 10:38 PM   Specimen: Nasopharyngeal Swab  Result Value Ref Range   SARS Coronavirus 2 NEGATIVE NEGATIVE    Comment: (NOTE) SARS-CoV-2 target nucleic acids are NOT DETECTED.  The SARS-CoV-2 RNA is generally detectable in upper and lower respiratory specimens during the acute phase of infection. The lowest concentration of SARS-CoV-2 viral copies this assay can detect is 250 copies / mL. A negative result does not preclude SARS-CoV-2 infection and should not be used as the sole basis for treatment or other patient management decisions.  A negative result may occur with improper specimen collection / handling, submission of specimen other than nasopharyngeal swab, presence of viral mutation(s) within the areas targeted by this assay, and inadequate number of viral copies (<250 copies / mL). A negative result must be combined with clinical observations, patient history, and epidemiological information.  Fact Sheet  for Patients:   StrictlyIdeas.no  Fact Sheet  for Healthcare Providers: BankingDealers.co.za  This test is not yet approved or  cleared by the [place] FDA and has been authorized for detection and/or diagnosis of SARS-CoV-2 by FDA under an Emergency Use Authorization (EUA).  This EUA will remain in effect (meaning this test can be used) for the duration of the COVID-19 declaration under Section 564(b)(1) of the Act, 21 U.S.C. section 360bbb-3(b)(1), unless the authorization is terminated or revoked sooner.  Performed at [facility], [place] [address], [place], [place] [address]   Sedimentation rate     Status: Abnormal   Collection Time: 11/14/19  1:09 AM  Result Value Ref Range   Sed Rate 64 (H) 0 - 22 mm/hr    Comment: Performed at [facility] [address] [place], [place] [address]  C-reactive protein     Status: None   Collection Time: 11/14/19  1:09 AM  Result Value Ref Range   CRP 0.6 <1.0 mg/dL    Comment: Performed at [facility] [address], [place], [place] [address]  HIV Antibody (routine testing w rflx)     Status: None   Collection Time: 11/14/19  1:09 AM  Result Value Ref Range   HIV Screen 4th Generation wRfx Non Reactive Non Reactive    Comment: Performed at [facility], [place] [address], [place], [place] [address]  Hepatitis C antibody     Status: None   Collection Time: 11/14/19  1:09 AM  Result Value Ref Range   HCV Ab NON REACTIVE NON REACTIVE    Comment: (NOTE) Nonreactive HCV antibody screen is consistent with no HCV infections,  unless recent infection is suspected or other evidence exists to indicate HCV infection.  Performed at [facility], [place] [address], [place], [place] [address]   TSH     Status: None   Collection Time: 11/14/19  1:09 AM  Result Value Ref Range   TSH 2.037 0.350 - 4.500 uIU/mL    Comment: Performed by a 3rd Generation assay with a functional sensitivity of <=0.01 uIU/mL. Performed at [facility], [place] [address], [place], [place] [address]   Comprehensive metabolic panel     Status: Abnormal   Collection Time: 11/14/19  1:09 AM  Result Value Ref Range   Sodium 133 (L) 135 - 145 mmol/L   Potassium 4.0 3.5 - 5.1 mmol/L   Chloride 95 (L) 98 - 111 mmol/L   CO2 29 22 - 32 mmol/L   Glucose, Bld 150 (H) 70 - 99 mg/dL    Comment: Glucose reference range applies only to samples taken after fasting for at least 8 hours.   BUN 12 6 - 20 mg/dL   Creatinine, Ser 0.94 0.44 - 1.00 mg/dL   Calcium 9.3 8.9 - 10.3 mg/dL   Total Protein 7.2 6.5 - 8.1 g/dL   Albumin 3.0 (L) 3.5 - 5.0 g/dL   AST 74 (H) 15 - 41 U/L   ALT 52 (H) 0 - 44 U/L   Alkaline Phosphatase 60 38 - 126 U/L   Total Bilirubin 0.7 0.3 - 1.2 mg/dL   GFR calc non Af Amer >60 >60 mL/min   GFR calc Af Amer >60 >60 mL/min   Anion gap 9 5 - 15    Comment: Performed at [facility] [address], [place], [place] [address]  CBC     Status: Abnormal  Collection Time: 11/14/19  1:09 AM  Result Value Ref Range   WBC 11.2 (H) 4.0 - 10.5 K/uL   RBC 4.57 3.87 - 5.11 MIL/uL   Hemoglobin 12.4 12.0 - 15.0 g/dL   HCT 39.0 36 - 46 %   MCV 85.3 80.0 - 100.0 fL   MCH 27.1 26.0 - 34.0 pg   MCHC 31.8 30.0 - 36.0 g/dL   RDW 16.2 (H) 11.5 - 15.5 %   Platelets 339 150 - 400 K/uL   nRBC 0.0 0.0 - 0.2 %    Comment: Performed at [facility], [place] [address] [place], [place] [address]  Troponin I (High Sensitivity)     Status: Abnormal   Collection Time: 11/14/19  1:09 AM  Result Value Ref Range   Troponin I (High Sensitivity) 20 (H) <18 ng/L    Comment: (NOTE) Elevated high sensitivity troponin I (hsTnI) values and significant  changes across serial measurements may suggest ACS but many other  chronic and acute conditions are known to elevate hsTnI results.  Refer to the "Links" section for chest pain algorithms and additional  guidance. Performed at [facility], [place] [address], [place], [place] [address]   Vitamin  B12     Status: None   Collection Time: 11/14/19  1:09 AM  Result Value Ref Range   Vitamin B-12 869 180 - 914 pg/mL    Comment: (NOTE) This assay is not validated for testing neonatal or myeloproliferative syndrome specimens for Vitamin B12 levels. Performed at [facility], [place] [address], [place], [place] [address]   Folate, serum, performed at [facility] lab     Status: None   Collection Time: 11/14/19  1:09 AM  Result Value Ref Range   Folate 32.8 >5.9 ng/mL    Comment: Performed at [facility], [place] [address] [place],  [address]  Hemoglobin A1c     Status: Abnormal   Collection Time: 11/14/19  1:09 AM  Result Value Ref Range   Hgb A1c MFr Bld 5.9 (H) 4.8 - 5.6 %    Comment: (NOTE) Pre diabetes:          5.7%-6.4%  Diabetes:              >6.4%  Glycemic control for   <7.0% adults with diabetes    Mean Plasma Glucose 122.63 mg/dL    Comment: Performed at [facility] [address] [place],  [address]  Glucose, capillary     Status: None   Collection Time: 11/14/19  6:09 AM  Result Value Ref Range   Glucose-Capillary 84 70 - 99 mg/dL    Comment: Glucose reference range applies only to samples taken after fasting for at least 8 hours.   Comment 1 Notify RN   CSF cell count with differential     Status: Abnormal   Collection Time: 11/14/19 11:28 AM  Result Value Ref Range   Tube # 3    Color, CSF YELLOW (A) COLORLESS   Appearance, CSF HAZY (A) CLEAR   Supernatant XANTHOCHROMIC    RBC Count, CSF 6 (H) 0 /cu mm   WBC, CSF 110 (HH) 0 - 5 /cu mm    Comment: CRITICAL RESULT CALLED TO, READ BACK BY AND VERIFIED WITH: WEISNER,H RN @1239  ON [id number] BY FLEMINGS    Segmented Neutrophils-CSF 1 0 - 6 %   Lymphs, CSF 95 (H) 40 - 80 %   Monocyte-Macrophage-Spinal Fluid 4 (L)  15 - 45 %   Eosinophils, CSF 0 0 - 1 %    Comment: Performed at [facility], [place] [address] [place], [place] [address]  Protein and glucose, CSF     Status:  Abnormal   Collection Time: 11/14/19 11:28 AM  Result Value Ref Range   Glucose, CSF <20 (LL) 40 - 70 mg/dL    Comment: RESULT REPEATED AND VERIFIED CRITICAL RESULT CALLED TO, READ BACK BY AND VERIFIED WITH: H.WEISNER,RN 1244 11/14/19 CLARK,S    Total  Protein, CSF 592 (H) 15 - 45 mg/dL    Comment: RESULTS CONFIRMED BY MANUAL DILUTION Performed at [facility] [address], [place], [place] [address]   Cryptococcal antigen, CSF     Status: None   Collection Time: 11/14/19  1:11 PM  Result Value Ref Range   Crypto Ag NEGATIVE NEGATIVE   Cryptococcal Ag Titer NOT INDICATED NOT INDICATED    Comment: Performed at [facility], [address] [place], [place] [address]  CSF culture with Stat gram stain     Status: None (Preliminary result)   Collection Time: 11/14/19  1:29 PM   Specimen: CSF; Cerebrospinal Fluid  Result Value Ref Range   Specimen Description CSF    Special Requests NONE    Gram Stain      WBC PRESENT, PREDOMINANTLY MONONUCLEAR NO ORGANISMS SEEN CYTOSPIN SMEAR Performed at [facility], [place] [address], [place], [place] [address]    Culture PENDING    Report Status PENDING   Urinalysis, Routine w reflex microscopic     Status: Abnormal   Collection Time: 11/14/19  1:44 PM  Result Value Ref Range   Color, Urine AMBER (A) YELLOW    Comment: BIOCHEMICALS MAY BE AFFECTED BY COLOR   APPearance CLOUDY (A) CLEAR   Specific Gravity, Urine 1.014 1.005 - 1.030   pH 7.0 5.0 - 8.0   Glucose, UA NEGATIVE NEGATIVE mg/dL   Hgb urine dipstick SMALL (A) NEGATIVE   Bilirubin Urine NEGATIVE NEGATIVE   Ketones, ur NEGATIVE NEGATIVE mg/dL   Protein, ur 30 (A) NEGATIVE mg/dL   Nitrite POSITIVE (A) NEGATIVE   Leukocytes,Ua SMALL (A) NEGATIVE   RBC / HPF 0-5 0 - 5 RBC/hpf   WBC, UA 6-10 0 - 5 WBC/hpf   Bacteria, UA MANY (A) NONE SEEN   Squamous Epithelial / LPF 0-5 0 - 5   Triple Phosphate Crystal PRESENT     Comment: Performed at [facility],  [address], [place], [place] [address]  Basic metabolic panel     Status: Abnormal   Collection Time: 11/15/19  3:39 AM  Result Value Ref Range   Sodium 131 (L) 135 - 145 mmol/L   Potassium 3.9 3.5 - 5.1 mmol/L   Chloride 95 (L) 98 - 111 mmol/L   CO2 27 22 - 32 mmol/L   Glucose, Bld 96 70 - 99 mg/dL    Comment: Glucose reference range applies only to samples taken after fasting for at least 8 hours.   BUN 7 6 - 20 mg/dL   Creatinine, Ser 0.75 0.44 - 1.00 mg/dL   Calcium 8.7 (L) 8.9 - 10.3 mg/dL   GFR calc non Af Amer >60 >60 mL/min   GFR calc Af Amer >60 >60 mL/min   Anion gap 9 5 - 15    Comment: Performed at [facility] [name], [place], [place] [address]  Glucose, capillary  Status: Abnormal   Collection Time: 11/15/19  6:10 AM  Result Value Ref Range   Glucose-Capillary 101 (H) 70 - 99 mg/dL    Comment: Glucose reference range applies only to samples taken after fasting for at least 8 hours.    Recent Results (from the past 240 hour(s))  SARS Coronavirus 2 by RT PCR (hospital order, performed in [facility] hospital lab) Nasopharyngeal Nasopharyngeal Swab     Status: None   Collection Time: 11/13/19 10:38 PM   Specimen: Nasopharyngeal Swab  Result Value Ref Range Status   SARS Coronavirus 2 NEGATIVE NEGATIVE Final    Comment: (NOTE) SARS-CoV-2 target nucleic acids are NOT DETECTED.  The SARS-CoV-2 RNA is generally detectable in upper and lower respiratory specimens during the acute phase of infection. The lowest concentration of SARS-CoV-2 viral copies this assay can detect is 250 copies / mL. A negative result does not preclude SARS-CoV-2 infection and should not be used as the sole basis for treatment or other patient management decisions.  A negative result may occur with improper specimen collection / handling, submission of specimen other than nasopharyngeal swab, presence of viral mutation(s) within the areas targeted by this assay, and inadequate  number of viral copies (<250 copies / mL). A negative result must be combined with clinical observations, patient history, and epidemiological information.  Fact Sheet for Patients:   StrictlyIdeas.no  Fact Sheet for Healthcare Providers: BankingDealers.co.za  This test is not yet approved or  cleared by the [place] FDA and has been authorized for detection and/or diagnosis of SARS-CoV-2 by FDA under an Emergency Use Authorization (EUA).  This EUA will remain in effect (meaning this test can be used) for the duration of the COVID-19 declaration under Section 564(b)(1) of the Act, 21 U.S.C. section 360bbb-3(b)(1), unless the authorization is terminated or revoked sooner.  Performed at [facility], [place] [address], [place], [place] [address]   CSF culture with Stat gram stain     Status: None (Preliminary result)   Collection Time: 11/14/19  1:29 PM   Specimen: CSF; Cerebrospinal Fluid  Result Value Ref Range Status   Specimen Description CSF  Final   Special Requests NONE  Final   Gram Stain   Final    WBC PRESENT, PREDOMINANTLY MONONUCLEAR NO ORGANISMS SEEN CYTOSPIN SMEAR Performed at [facility], [place] [address] [place], [place] [address]    Culture PENDING  Incomplete   Report Status PENDING  Incomplete    Lipid Panel No results for input(s): CHOL, TRIG, HDL, CHOLHDL, VLDL, LDLCALC in the last 72 hours.  Studies/Results: DG Chest 2 View  Result Date: 11/13/2019 CLINICAL DATA:  [place] this morning, altered level of consciousness EXAM: CHEST - 2 VIEW COMPARISON:  05/23/2019 FINDINGS: The heart size and mediastinal contours are within normal limits. Both lungs are clear. The visualized skeletal structures are unremarkable. IMPRESSION: No active cardiopulmonary disease. Electronically Signed   By: Randa Ngo M.D.   On: 11/13/2019 22:36   CT CERVICAL SPINE WO CONTRAST  Result Date: 11/13/2019 CLINICAL DATA:   Fall EXAM: CT CERVICAL SPINE WITHOUT CONTRAST TECHNIQUE: Multidetector CT imaging of the cervical spine was performed without intravenous contrast. Multiplanar CT image reconstructions were also generated. COMPARISON:  None. FINDINGS: Alignment: Mild reversal the normal cervical lordosis, likely positional. No spondylolisthesis. Skull base and vertebrae: No acute fracture. No primary bone lesion or focal pathologic process. Soft tissues and spinal canal: No prevertebral fluid or swelling. No visible canal hematoma. Disc  levels: Moderate loss of disc height from C3-C4 through C6-C7. Endplate spurring and mild spondylotic disc bulging at these levels. No convincing disc herniation. Upper chest: No acute findings.  Clear lung apices. Other: None. IMPRESSION: 1. No fracture or acute finding. Electronically Signed   By: Lajean Manes M.D.   On: 11/13/2019 16:46   MR BRAIN WO CONTRAST  Result Date: 11/13/2019 CLINICAL DATA:  Left-sided weakness EXAM: MRI HEAD WITHOUT CONTRAST TECHNIQUE: Multiplanar, multiecho pulse sequences of the brain and surrounding structures were obtained without intravenous contrast. COMPARISON:  08/04/2019 FINDINGS: Brain: No acute infarct, acute hemorrhage or extra-axial collection. There is hyperintense T2-weighted signal within the pons and medulla oblongata, new since the prior study. Supratentorial white matter disease is also markedly worsened. No associated diffusion restriction. 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 Normal volume of CSF spaces. No chronic microhemorrhage. There is thickening of the pituitary infundibulum. Vascular: Normal flow voids. Skull and upper cervical spine: There is hyperintense T2-weighted signal visible within the upper spinal cord. Sinuses/Orbits: Negative. Other: None. IMPRESSION: 1. New white matter disease in the cerebrum, brainstem and visualized upper cervical spinal cord, thickening of the pituitary stalk. The primary differential  considerations are neurosarcoidosis and acute demyelinating disease. 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 Electronically Signed   By: Ulyses Jarred M.D.   On: 11/13/2019 19:37   MR BRAIN W CONTRAST  Result Date: 11/14/2019 CLINICAL DATA:  Acute encephalopathy EXAM: MRI HEAD WITH CONTRAST TECHNIQUE: Multiplanar, multiecho pulse sequences of the brain and surrounding structures were obtained with intravenous contrast. CONTRAST:  9.98mL GADAVIST GADOBUTROL 1 MMOL/ML IV SOLN COMPARISON:  None. FINDINGS: There is a large amount of leptomeningeal contrast enhancement within the posterior fossa and along the brainstem. Supratentorial leptomeningeal contrast enhancement is greatest along the inter hemispheric fissure and near the circle-of-Willis. The infundibulum is markedly thickened. Enhancement extends along the cisternal segments of cranial nerves 5, 7 and 8. The smaller lower cranial nerves are difficult to visualize on this study. IMPRESSION: Extensive leptomeningeal contrast enhancement favoring the posterior fossa and brainstem with thickening of the infundibulum. This is most typical of neurosarcoid. Other forms of inflammatory/infectious leptomeningitis remain possible, but are less likely. Electronically Signed   By: Ulyses Jarred M.D.   On: 11/14/2019 00:53   MR Cervical Spine W or Wo Contrast  Result Date: 11/14/2019 CLINICAL DATA:  Encephalopathy and weakness. EXAM: MRI CERVICAL SPINE WITHOUT AND WITH CONTRAST MRI THORACIC SPINE WITHOUT AND WITH CONTRAST TECHNIQUE: Multiplanar and multiecho pulse sequences of the cervical spine, to include the craniocervical junction and cervicothoracic junction, were obtained without and with intravenous contrast. CONTRAST:  9.64mL GADAVIST GADOBUTROL 1 MMOL/ML IV SOLN COMPARISON:  None. FINDINGS: CERVICAL SPINE FINDINGS Alignment:  Physiologic. Vertebrae: Heterogeneous bone marrow signal without focal lesion. Cord: There is hyperintense T2-weighted signal within the cord at the cervicomedullary junction and C2 level. There is leptomeningeal contrast enhancement extending the length of the cervical spine, greatest at the cervicomedullary junction and the C2 and C3 levels. Posterior Fossa, vertebral arteries, paraspinal tissues: Posterior fossa is evaluated separately on brain MRI. No prevertebral effusion. Normal flow voids. Disc levels: C2-3: Negative C3-4: Small disc bulge without spinal canal stenosis. C4-5: Small diffuse disc bulge.  No spinal canal stenosis. C5-6: Left foraminal disc protrusion and uncovertebral hypertrophy with severe left foraminal stenosis. C6-7: Mild disc bulge without spinal canal stenosis. C7-T1:  Negative THORACIC SPINE FINDINGS Alignment: Physiologic. Vertebrae: No fracture, evidence of discitis, or bone lesion. Cord: There is multifocal leptomeningeal enhancement along the length of the thoracic spinal cord. Slightly less extensive involvement of the thoracic spine and the cervical spine. Paraspinal tissues: Negative. Disc levels: No disc herniation or stenosis. IMPRESSION: 1. Multifocal leptomeningeal contrast enhancement along the length of the cervical and thoracic spinal cord, compatible with neurosarcoid. Other forms of inflammatory or infectious encephalomyelitis are considered less likely. 2. No cervical or thoracic spinal canal stenosis. 3. 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 Electronically Signed   By: Ulyses Jarred M.D.   On: 11/14/2019 00:46   MR THORACIC SPINE W WO CONTRAST  Result Date: 11/14/2019 CLINICAL DATA:  Encephalopathy and weakness. EXAM: MRI CERVICAL SPINE WITHOUT AND WITH CONTRAST MRI THORACIC SPINE WITHOUT AND WITH CONTRAST TECHNIQUE: Multiplanar and multiecho pulse sequences of the cervical spine, to include the  craniocervical junction and cervicothoracic junction, were obtained without and with intravenous contrast. CONTRAST:  9.53mL GADAVIST GADOBUTROL 1 MMOL/ML IV SOLN COMPARISON:  None. FINDINGS: CERVICAL SPINE FINDINGS Alignment: Physiologic. Vertebrae: Heterogeneous bone marrow signal without focal lesion. Cord: There is hyperintense T2-weighted signal within the cord at the cervicomedullary junction and C2 level. There is leptomeningeal contrast enhancement extending the length of the cervical spine, greatest at the cervicomedullary junction and the C2 and C3 levels. Posterior Fossa, vertebral arteries, paraspinal tissues: Posterior fossa is evaluated separately on brain MRI. No prevertebral effusion. Normal flow voids. Disc levels: C2-3: Negative C3-4: Small disc bulge without spinal canal stenosis. C4-5: Small diffuse disc bulge.  No spinal canal stenosis. C5-6: Left foraminal disc protrusion and uncovertebral hypertrophy with severe left foraminal stenosis. C6-7: Mild disc bulge without spinal canal stenosis. C7-T1: Negative THORACIC SPINE FINDINGS Alignment: Physiologic. Vertebrae: No fracture, evidence of discitis, or bone lesion. Cord: There is multifocal leptomeningeal enhancement along the length of the thoracic spinal cord. Slightly less extensive involvement of the thoracic spine and the cervical spine. Paraspinal tissues: Negative. Disc levels: No disc herniation or stenosis. IMPRESSION: 1. Multifocal leptomeningeal contrast enhancement along the length of the cervical and thoracic spinal cord, compatible with neurosarcoid. Other forms of inflammatory or infectious encephalomyelitis are considered less likely. 2. No cervical or thoracic spinal canal stenosis. 3. 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 Electronically Signed   By: Ulyses Jarred M.D.   On: 11/14/2019 00:46   ECHOCARDIOGRAM COMPLETE  Result Date: 11/14/2019    ECHOCARDIOGRAM  REPORT   Patient Name:   [name] Date of Exam: 11/14/2019 Medical Rec #:  [id number]      Height:       63.0 in Accession #:    [id number]     Weight:       163.1 lb Date of Birth:  Feb 24, 1961      BSA:          1.773 m Patient Age:    4 years       BP:           130/89 mmHg Patient Gender: F              HR:           97 bpm. Exam Location:  Inpatient Procedure: 2D Echo Indications:    TIA  History:        Patient has prior history of Echocardiogram examinations, most  recent 05/23/2019. Risk Factors:Hypertension.  Sonographer:    Mikki Santee RDCS (AE) Referring Phys: [id number] [place]  1. Left ventricular ejection fraction, by estimation, is 65 to 70%. The left ventricle has normal function. The left ventricle has no regional wall motion abnormalities. There is mild left ventricular hypertrophy. Left ventricular diastolic parameters were normal.  2. Right ventricular systolic function is normal. The right ventricular size is normal. There is normal pulmonary artery systolic pressure.  3. The mitral valve is normal in structure. Trivial mitral valve regurgitation.  4. The aortic valve is tricuspid. Aortic valve regurgitation is not visualized.  5. The inferior vena cava is normal in size with greater than 50% respiratory variability, suggesting right atrial pressure of 3 mmHg. Comparison(s): No significant change from prior study. FINDINGS  Left Ventricle: Left ventricular ejection fraction, by estimation, is 65 to 70%. The left ventricle has normal function. The left ventricle has no regional wall motion abnormalities. The left ventricular internal cavity size was normal in size. There is  mild left ventricular hypertrophy. Left ventricular diastolic parameters were normal. Right Ventricle: The right ventricular size is normal. No increase in right ventricular wall thickness. Right ventricular systolic function is normal. There is normal pulmonary artery systolic  pressure. The tricuspid regurgitant velocity is 2.00 m/s, and  with an assumed right atrial pressure of 3 mmHg, the estimated right ventricular systolic pressure is 21.3 mmHg. Left Atrium: Left atrial size was normal in size. Right Atrium: Right atrial size was normal in size. Pericardium: There is no evidence of pericardial effusion. Mitral Valve: The mitral valve is normal in structure. Trivial mitral valve regurgitation. Tricuspid Valve: The tricuspid valve is normal in structure. Tricuspid valve regurgitation is trivial. Aortic Valve: The aortic valve is tricuspid. Aortic valve regurgitation is not visualized. Pulmonic Valve: The pulmonic valve was normal in structure. Pulmonic valve regurgitation is not visualized. Aorta: The aortic root is normal in size and structure. Venous: The inferior vena cava is normal in size with greater than 50% respiratory variability, suggesting right atrial pressure of 3 mmHg. IAS/Shunts: No atrial level shunt detected by color flow Doppler.  LEFT VENTRICLE PLAX 2D LVIDd:         4.00 cm  Diastology LVIDs:         2.50 cm  LV e' lateral:   6.42 cm/s LV PW:         1.20 cm  LV E/e' lateral: 12.1 LV IVS:        1.10 cm  LV e' medial:    6.20 cm/s LVOT diam:     2.00 cm  LV E/e' medial:  12.6 LV SV:         74 LV SV Index:   42 LVOT Area:     3.14 cm  RIGHT VENTRICLE RV S prime:     9.25 cm/s TAPSE (M-mode): 2.0 cm LEFT ATRIUM             Index       RIGHT ATRIUM          Index LA diam:        2.80 cm 1.58 cm/m  RA Area:     9.14 cm LA Vol (A2C):   37.0 ml 20.87 ml/m RA Volume:   18.70 ml 10.55 ml/m LA Vol (A4C):   35.4 ml 19.96 ml/m LA Biplane Vol: 38.1 ml 21.49 ml/m  AORTIC VALVE LVOT Vmax:   123.00 cm/s LVOT Vmean:  85.700 cm/s LVOT VTI:  0.235 m  AORTA Ao Root diam: 2.60 cm MITRAL VALVE               TRICUSPID VALVE MV Area (PHT): 2.68 cm    TR Peak grad:   16.0 mmHg MV Decel Time: 283 msec    TR Vmax:        200.00 cm/s MV E velocity: 78.00 cm/s MV A velocity: 86.10  cm/s  SHUNTS MV E/A ratio:  0.91        Systemic VTI:  0.24 m                            Systemic Diam: 2.00 cm Dorris Carnes MD Electronically signed by Dorris Carnes MD Signature Date/Time: 11/14/2019/6:18:29 PM    Final    CT HEAD CODE STROKE WO CONTRAST  Result Date: 11/13/2019 CLINICAL DATA:  Code stroke.  Altered mental status EXAM: CT HEAD WITHOUT CONTRAST TECHNIQUE: Contiguous axial images were obtained from the base of the skull through the vertex without intravenous contrast. COMPARISON:  08/04/2019 FINDINGS: Brain: There is no acute intracranial hemorrhage, mass effect, or edema. No new loss of gray-white differentiation. Ventricles and sulci are normal in size and configuration. Patchy hypoattenuation in the supratentorial white matter is nonspecific but probably reflects stable chronic microvascular ischemic changes. 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 No extra-axial fluid collection. Vascular: No hyperdense vessel. Mild intracranial atherosclerotic calcification is present at the skull base. Skull: Unremarkable Sinuses/Orbits: Aerated.  Orbits are unremarkable. Other: Opacification of the inferior right mastoid air cells. ASPECTS ([place] Stroke Program Early CT Score) - Ganglionic level infarction (caudate, lentiform nuclei, internal capsule, insula, M1-M3 cortex): 7 - Supraganglionic infarction (M4-M6 cortex): 3 Total score (0-10 with 10 being normal): 10 IMPRESSION: No acute intracranial hemorrhage or evidence of acute infarction. ASPECT score is 10. Stable chronic findings detailed above. These results were communicated to Dr. Lorraine Lax at 4:34 pmon 6/27/2021by text page via the [facility] messaging system. Electronically Signed   By: Macy Mis M.D.   On: 11/13/2019 16:36    Medications:  Scheduled: . multivitamin with minerals  1 tablet Oral Daily  . pantoprazole  40 mg Oral TID AC  . sodium chloride flush  3 mL Intravenous Once  . 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 MRI brain revealed new white matter disease in the cerebrum, brainstem and visualized upper cervical spinal cord, as well as thickening of the pituitary stalk. Follow up MRI brain with contrast, as well as cervical and thoracic MRI with and without contrast, revealed diffuse and multifocal leptomenengeal nodular enhancement. Exam findings of multifocal motor and sensory deficits in a patchy distribution are consistent with the multifocal abnormalities seen on MRI 1. CSF interpretation:   -- CSF WBC significantly elevated at 95. Predominantly lymphocytes, which would be expected in a chronic inflammatory condition affecting the CNS, including neurosarcoidosis.    -- Glucose markedly low at < 20: This is secondary to the high number of WBC in the patient's CSF  -- Protein markedly elevated at 592. Protein levels may be highly elevated in the CSF of patients with neurosarcoidosis, especially in those with diffuse leptomeningeal involvement on MRI, as is the case with this patient.   -- CSF culture x 1 day: No  organisms seen.  -- CSF gram stain: No organisms seen.  -- CSF ACE level: Pending  -- CSF flow cytometry and cytology studies: Pending 2. 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 Leptomeningeal carcinomatosis is still possible, but is felt to be unlikely - flow cytometry and cytology studies are pending.  3. Elevated LFTs - ? Hepatic sarcoidosis 4. Joint pain complaints: ? Inflammatory etiology if she is experiencing joint manifestations of sarcoidosis (sarcoid arthritis).    Recommendations: 1. Start high dose IV methylprednisolone, 1000 mg qd x 5 days (ordered). Monitor CBG at  regular intervals as well as daily CBC and Chem7 while the patient is undergoing steroid treatment. 2. After completion of IV steroids, start prednisone 1 to 1.5 mg/kg per day for two to four weeks and then start a slow taper. Per UpToDate: "A slow prednisone taper should be considered in patients who are severely compromised or for whom a chronic course is likely. One useful approach is to allow the patient to stabilize or improve with four weeks of treatment and then to decrease the prednisone dose by 5 mg every two weeks as tolerated by symptoms. Exacerbations are most common when a prednisone dose of 10 mg/day is approached; thus, the dose should be decreased by 1 mg decrements every one to two weeks when the dose is near this level." 3. Thin-slice CT of chest with and without contrast to assess for possible subclinical pulmonary sarcoidosis - deferring the ordering of this study to primary team.  4. Serum ACE level (ordered) 5. CT of abdomen and pelvis with and without contrast to assess for possible primary malignancy - deferring to primary team.  6. Will need follow up MRI brain and cervical spinal cord with and without contrast to assess for radiological improvement after initial steroid treatment.    LOS: 2 days   @Electronically  signed: Dr. Kerney Elbe 11/15/2019  7:42 AM

## 2019-11-15 NOTE — Consult Note (Signed)
Physical Medicine and Rehabilitation Consult Reason for Consult: Altered mental status with decreased functional mobility Referring Physician: [facility]   HPI: Rhonda [name] is a 59 y.o. right-handed female with history of left paramedian pontine infarct in January hypertension as well as prediabetes.  Per chart review patient lives with spouse and 2 grandchildren ages 40 and 46.  Two-level home with half bath on main level bed and bath upstairs.  Patient ambulates short distance with a rolling walker limited due to vertigo.  Presented 11/13/2019 with altered mental status agitation and fall.  MRI of the brain showed extensive leptomeningeal contrast enhancement favoring the posterior fossa and brainstem with thickening of the infundibulum.  This was felt most typical of neurosarcoid versus inflammatory infectious leptomeningitis remaining possible but less likely.  MRI cervical thoracic spine showed multifocal leptomeningeal contrast-enhancement along the length of the cervical and thoracic spinal cord compatible with neurosarcoid.  No cervical or thoracic spinal canal stenosis.  Severe left C5-6 neuroforaminal stenosis secondary to disc protrusion.  Echocardiogram ejection fraction of 70% no wall motion abnormalities.  Admission chemistries with sodium 132, glucose 100, rheumatoid factor pending, C-reactive protein 0.6, neuromyelitis optica pending, CSF with WBC 110 lymphs 95 monocyte 4.  Neurology follow-up currently maintained on IV methylprednisolone x5 days.  Tolerating a regular diet.  Therapy evaluations completed 11/14/2019 with recommendations of physical medicine rehab consult.   Review of Systems  Constitutional: Negative for chills and fever.  HENT: Negative for hearing loss.   Eyes: Negative for blurred vision and double vision.  Respiratory: Negative for cough and shortness of breath.   Cardiovascular: Negative for chest pain, palpitations and leg swelling.  Gastrointestinal:  Positive for constipation. Negative for heartburn, nausea and vomiting.  Genitourinary: Negative for dysuria, flank pain and hematuria.  Musculoskeletal: Positive for falls, joint pain and myalgias.  Skin: Negative for rash.  Neurological: Positive for weakness.  All other systems reviewed and are negative.  Past Medical History:  Diagnosis Date  . Arthritis   . Degenerative disc disease, lumbar   . Hypertension   . Prediabetes    Past Surgical History:  Procedure Laterality Date  . ABDOMINAL HYSTERECTOMY    . BREAST EXCISIONAL BIOPSY Right   . BREAST SURGERY     right breast  . CESAREAN SECTION     Family History  Problem Relation Age of Onset  . Cancer Mother   . Hypertension Sister   . Diabetes Sister   . Diabetes Maternal Grandmother    Social History:  reports that she has never smoked. She has never used smokeless tobacco. She reports that she does not drink alcohol and does not use drugs. Allergies:  Allergies  Allergen Reactions  . Aspirin Nausea And Vomiting  . Augmentin [Amoxicillin-Pot Clavulanate] Swelling and Other (See Comments)    Hand swelled at IV site   Medications Prior to Admission  Medication Sig Dispense Refill  . acetaminophen (TYLENOL) 650 MG CR tablet Take 1,300 mg by mouth 2 (two) times daily as needed (arthritis pain).     Marland Kitchen aspirin EC 81 MG tablet Take 1 tablet (81 mg total) by mouth daily. 30 tablet 11  . atorvastatin (LIPITOR) 80 MG tablet Take 1 tablet (80 mg total) by mouth daily at 6 PM. (Patient taking differently: Take 80 mg by mouth daily. ) 30 tablet 2  . Cyanocobalamin (VITAMIN B-12 PO) Take 1 tablet by mouth daily.     Marland Kitchen gabapentin (NEURONTIN) 300 MG capsule TAKE 2  CAPSULES BY MOUTH 3 (THREE) TIMES DAILY. (Patient taking differently: Take 300 mg by mouth 3 (three) times daily. ) 180 capsule 3  . glucosamine-chondroitin 500-400 MG tablet Take 1 tablet by mouth 3 (three) times daily. (Patient taking differently: Take 2 tablets by mouth  daily. ) 90 tablet 2  . lisinopril-hydrochlorothiazide (ZESTORETIC) 10-12.5 MG tablet Take 1 tablet by mouth every evening.     . meclizine (ANTIVERT) 25 MG tablet Take 1 tablet (25 mg total) by mouth 3 (three) times daily as needed for dizziness. 30 tablet 0  . Multiple Vitamin (MULTIVITAMIN WITH MINERALS) TABS tablet Take 1 tablet by mouth daily.    . Omega-3 Fatty Acids (FISH OIL) 1000 MG CAPS Take 1,000 mg by mouth at bedtime.    . pantoprazole (PROTONIX) 40 MG tablet Take 40 mg by mouth 3 (three) times daily before meals.     Marland Kitchen PRESCRIPTION MEDICATION Inhale into the lungs See admin instructions. CPAP- At bedtime    . APPLE CIDER VINEGAR PO Take 2 capsules by mouth at bedtime.    . clopidogrel (PLAVIX) 75 MG tablet Take 1 tablet (75 mg total) by mouth daily. (Patient not taking: Reported on 11/13/2019) 21 tablet 0    Home: Home Living Family/patient expects to be discharged to:: Private residence Living Arrangements: Spouse/significant other Available Help at Discharge: Family, Available 24 hours/day (spouse works 3 days/week) Type of Home: House Home Access: Stairs to enter Technical brewer of Steps: 4 Entrance Stairs-Rails: None Home Layout: Two level, 1/2 bath on main level, Bed/bath upstairs Alternate Level Stairs-Number of Steps: flight Alternate Level Stairs-Rails: Right Bathroom Shower/Tub: Tub/shower unit, Curtain Bathroom Toilet: Handicapped height Bathroom Accessibility: Yes Home Equipment: Bedside commode, Wheelchair - manual, Environmental consultant - 2 wheels Additional Comments: bedroom moved to first level recently.  Functional History: Prior Function Level of Independence: Needs assistance Gait / Transfers Assistance Needed: Using w/c for mobility. Limited due to vertigo. Walks short distances with RW on a good day but not recently. ADL's / Homemaking Assistance Needed: Spouse helps with getting in/out of shower. Dresses self sitting. Comments: Had a fall prior to admission  in which she bumped her head on the sink. Functional Status:  Mobility: Bed Mobility Overal bed mobility: Needs Assistance Bed Mobility: Supine to Sit Supine to sit: Mod assist General bed mobility comments: increased time and effort, cues for technique, assist to raise trunk Transfers Overall transfer level: Needs assistance Equipment used: Rolling walker (2 wheeled), 2 person hand held assist Transfers: Sit to/from Stand, Stand Pivot Transfers Sit to Stand: Mod assist Stand pivot transfers: Mod assist General transfer comment: assist to rise and steady, heavily reliant on UEs to stand and can maintain only momentarily, stabilizes LEs on sitting surface; SPT bed to [facility] with pt able to take a few steps; pulled recliner up behind pt after she stood from [facility] Ambulation/Gait General Gait Details: Unable to today    ADL: ADL Overall ADL's : Needs assistance/impaired Eating/Feeding: Independent, Sitting Grooming: Wash/dry hands, Wash/dry face, Sitting, Set up Upper Body Bathing: Minimal assistance, Sitting Lower Body Bathing: Total assistance, Sit to/from stand Upper Body Dressing : Minimal assistance, Sitting Lower Body Dressing: Total assistance, Sit to/from stand Toilet Transfer: Moderate assistance, Stand-pivot, BSC Toileting- Clothing Manipulation and Hygiene: Total assistance, Sit to/from stand  Cognition: Cognition Overall Cognitive Status: Impaired/Different from baseline Orientation Level: Oriented X4 Cognition Arousal/Alertness: Awake/alert Behavior During Therapy: WFL for tasks assessed/performed Overall Cognitive Status: Impaired/Different from baseline Area of Impairment: Memory, Problem solving Memory: Decreased  short-term memory Problem Solving: Decreased initiation General Comments: pt does not recall events of day of admission until paramedics were standing over her, pt in urine soaked bed and had not had breakfast but had not called for assistance  Blood  pressure (!) 144/99, pulse (!) 102, temperature 98.7 F (37.1 C), temperature source Oral, resp. rate 17, weight 73.5 kg, SpO2 100 %. Physical Exam Constitutional:      General: She is not in acute distress.    Appearance: She is obese.  HENT:     Head: Normocephalic.     Nose: Nose normal.     Mouth/Throat:     Mouth: Mucous membranes are moist.  Eyes:     Pupils: Pupils are equal, round, and reactive to light.  Cardiovascular:     Rate and Rhythm: Normal rate.     Pulses: Normal pulses.  Pulmonary:     Effort: Pulmonary effort is normal.  Abdominal:     General: There is no distension.     Palpations: Abdomen is soft.  Musculoskeletal:        General: No swelling.     Cervical back: Normal range of motion.  Skin:    General: Skin is warm.  Neurological:     Mental Status: She is alert.     Comments: Patient is alert. Oriented to place, month, year, missed day by one. Provided biographical information but demonstrated STM deficits, decreased attention. Left lid lag and lateral rectus weakness. ?nystagmus, decreased visual acuity without diplopia. UE grossly 5/5 bilaterally. LE: 2/5 HF, 3+ KE and 4/5 ADF/PF bilaterally. Decreased LT left leg from waist to foot.    Psychiatric:     Comments: Flat but cooperative     Results for orders placed or performed during the hospital encounter of 11/13/19 (from the past 24 hour(s))  CSF cell count with differential     Status: Abnormal   Collection Time: 11/14/19 11:28 AM  Result Value Ref Range   Tube # 3    Color, CSF YELLOW (A) COLORLESS   Appearance, CSF HAZY (A) CLEAR   Supernatant XANTHOCHROMIC    RBC Count, CSF 6 (H) 0 /cu mm   WBC, CSF 110 (HH) 0 - 5 /cu mm   Segmented Neutrophils-CSF 1 0 - 6 %   Lymphs, CSF 95 (H) 40 - 80 %   Monocyte-Macrophage-Spinal Fluid 4 (L) 15 - 45 %   Eosinophils, CSF 0 0 - 1 %  Protein and glucose, CSF     Status: Abnormal   Collection Time: 11/14/19 11:28 AM  Result Value Ref Range    Glucose, CSF <20 (LL) 40 - 70 mg/dL   Total  Protein, CSF 592 (H) 15 - 45 mg/dL  Cryptococcal antigen, CSF     Status: None   Collection Time: 11/14/19  1:11 PM  Result Value Ref Range   Crypto Ag NEGATIVE NEGATIVE   Cryptococcal Ag Titer NOT INDICATED NOT INDICATED  CSF culture with Stat gram stain     Status: None (Preliminary result)   Collection Time: 11/14/19  1:29 PM   Specimen: CSF; Cerebrospinal Fluid  Result Value Ref Range   Specimen Description CSF    Special Requests NONE    Gram Stain      WBC PRESENT, PREDOMINANTLY MONONUCLEAR NO ORGANISMS SEEN CYTOSPIN SMEAR    Culture      NO GROWTH < 24 HOURS Performed at [facility], [address] [place], [place] [address]  Report Status PENDING   Urinalysis, Routine w reflex microscopic     Status: Abnormal   Collection Time: 11/14/19  1:44 PM  Result Value Ref Range   Color, Urine AMBER (A) YELLOW   APPearance CLOUDY (A) CLEAR   Specific Gravity, Urine 1.014 1.005 - 1.030   pH 7.0 5.0 - 8.0   Glucose, UA NEGATIVE NEGATIVE mg/dL   Hgb urine dipstick SMALL (A) NEGATIVE   Bilirubin Urine NEGATIVE NEGATIVE   Ketones, ur NEGATIVE NEGATIVE mg/dL   Protein, ur 30 (A) NEGATIVE mg/dL   Nitrite POSITIVE (A) NEGATIVE   Leukocytes,Ua SMALL (A) NEGATIVE   RBC / HPF 0-5 0 - 5 RBC/hpf   WBC, UA 6-10 0 - 5 WBC/hpf   Bacteria, UA MANY (A) NONE SEEN   Squamous Epithelial / LPF 0-5 0 - 5   Triple Phosphate Crystal PRESENT   Basic metabolic panel     Status: Abnormal   Collection Time: 11/15/19  3:39 AM  Result Value Ref Range   Sodium 131 (L) 135 - 145 mmol/L   Potassium 3.9 3.5 - 5.1 mmol/L   Chloride 95 (L) 98 - 111 mmol/L   CO2 27 22 - 32 mmol/L   Glucose, Bld 96 70 - 99 mg/dL   BUN 7 6 - 20 mg/dL   Creatinine, Ser 0.75 0.44 - 1.00 mg/dL   Calcium 8.7 (L) 8.9 - 10.3 mg/dL   GFR calc non Af Amer >60 >60 mL/min   GFR calc Af Amer >60 >60 mL/min   Anion gap 9 5 - 15  Glucose, capillary     Status: Abnormal    Collection Time: 11/15/19  6:10 AM  Result Value Ref Range   Glucose-Capillary 101 (H) 70 - 99 mg/dL   DG Chest 2 View  Result Date: 11/13/2019 CLINICAL DATA:  [place] this morning, altered level of consciousness EXAM: CHEST - 2 VIEW COMPARISON:  05/23/2019 FINDINGS: The heart size and mediastinal contours are within normal limits. Both lungs are clear. The visualized skeletal structures are unremarkable. IMPRESSION: No active cardiopulmonary disease. Electronically Signed   By: Randa Ngo M.D.   On: 11/13/2019 22:36   CT CERVICAL SPINE WO CONTRAST  Result Date: 11/13/2019 CLINICAL DATA:  Fall EXAM: CT CERVICAL SPINE WITHOUT CONTRAST TECHNIQUE: Multidetector CT imaging of the cervical spine was performed without intravenous contrast. Multiplanar CT image reconstructions were also generated. COMPARISON:  None. FINDINGS: Alignment: Mild reversal the normal cervical lordosis, likely positional. No spondylolisthesis. Skull base and vertebrae: No acute fracture. No primary bone lesion or focal pathologic process. Soft tissues and spinal canal: No prevertebral fluid or swelling. No visible canal hematoma. Disc levels: Moderate loss of disc height from C3-C4 through C6-C7. Endplate spurring and mild spondylotic disc bulging at these levels. No convincing disc herniation. Upper chest: No acute findings.  Clear lung apices. Other: None. IMPRESSION: 1. No fracture or acute finding. Electronically Signed   By: Lajean Manes M.D.   On: 11/13/2019 16:46   MR BRAIN WO CONTRAST  Result Date: 11/13/2019 CLINICAL DATA:  Left-sided weakness EXAM: MRI HEAD WITHOUT CONTRAST TECHNIQUE: Multiplanar, multiecho pulse sequences of the brain and surrounding structures were obtained without intravenous contrast. COMPARISON:  08/04/2019 FINDINGS: Brain: No acute infarct, acute hemorrhage or extra-axial collection. There is hyperintense T2-weighted signal within the pons and medulla oblongata, new since the prior study.  Supratentorial white matter disease is also markedly worsened. No associated diffusion restriction. 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 Normal volume of  CSF spaces. No chronic microhemorrhage. There is thickening of the pituitary infundibulum. Vascular: Normal flow voids. Skull and upper cervical spine: There is hyperintense T2-weighted signal visible within the upper spinal cord. Sinuses/Orbits: Negative. Other: None. IMPRESSION: 1. New white matter disease in the cerebrum, brainstem and visualized upper cervical spinal cord, thickening of the pituitary stalk. The primary differential considerations are neurosarcoidosis and acute demyelinating disease. 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 Electronically Signed   By: Ulyses Jarred M.D.   On: 11/13/2019 19:37   MR BRAIN W CONTRAST  Result Date: 11/14/2019 CLINICAL DATA:  Acute encephalopathy EXAM: MRI HEAD WITH CONTRAST TECHNIQUE: Multiplanar, multiecho pulse sequences of the brain and surrounding structures were obtained with intravenous contrast. CONTRAST:  9.2mL GADAVIST GADOBUTROL 1 MMOL/ML IV SOLN COMPARISON:  None. FINDINGS: There is a large amount of leptomeningeal contrast enhancement within the posterior fossa and along the brainstem. Supratentorial leptomeningeal contrast enhancement is greatest along the inter hemispheric fissure and near the circle-of-Willis. The infundibulum is markedly thickened. Enhancement extends along the cisternal segments of cranial nerves 5, 7 and 8. The smaller lower cranial nerves are difficult to visualize on this study. IMPRESSION: Extensive leptomeningeal contrast enhancement favoring the posterior fossa and brainstem with thickening of the infundibulum. This is most typical of neurosarcoid. Other forms of inflammatory/infectious leptomeningitis remain possible, but are less  likely. Electronically Signed   By: Ulyses Jarred M.D.   On: 11/14/2019 00:53   MR Cervical Spine W or Wo Contrast  Result Date: 11/14/2019 CLINICAL DATA:  Encephalopathy and weakness. EXAM: MRI CERVICAL SPINE WITHOUT AND WITH CONTRAST MRI THORACIC SPINE WITHOUT AND WITH CONTRAST TECHNIQUE: Multiplanar and multiecho pulse sequences of the cervical spine, to include the craniocervical junction and cervicothoracic junction, were obtained without and with intravenous contrast. CONTRAST:  9.47mL GADAVIST GADOBUTROL 1 MMOL/ML IV SOLN COMPARISON:  None. FINDINGS: CERVICAL SPINE FINDINGS Alignment: Physiologic. Vertebrae: Heterogeneous bone marrow signal without focal lesion. Cord: There is hyperintense T2-weighted signal within the cord at the cervicomedullary junction and C2 level. There is leptomeningeal contrast enhancement extending the length of the cervical spine, greatest at the cervicomedullary junction and the C2 and C3 levels. Posterior Fossa, vertebral arteries, paraspinal tissues: Posterior fossa is evaluated separately on brain MRI. No prevertebral effusion. Normal flow voids. Disc levels: C2-3: Negative C3-4: Small disc bulge without spinal canal stenosis. C4-5: Small diffuse disc bulge.  No spinal canal stenosis. C5-6: Left foraminal disc protrusion and uncovertebral hypertrophy with severe left foraminal stenosis. C6-7: Mild disc bulge without spinal canal stenosis. C7-T1: Negative THORACIC SPINE FINDINGS Alignment: Physiologic. Vertebrae: No fracture, evidence of discitis, or bone lesion. Cord: There is multifocal leptomeningeal enhancement along the length of the thoracic spinal cord. Slightly less extensive involvement of the thoracic spine and the cervical spine. Paraspinal tissues: Negative. Disc levels: No disc herniation or stenosis. IMPRESSION: 1. Multifocal leptomeningeal contrast enhancement along the length of the cervical and thoracic spinal cord, compatible with neurosarcoid. Other forms of  inflammatory or infectious encephalomyelitis are considered less likely. 2. No cervical or thoracic spinal canal stenosis. 3. 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 Electronically Signed   By: Ulyses Jarred M.D.   On: 11/14/2019 00:46   MR THORACIC SPINE W WO CONTRAST  Result Date: 11/14/2019 CLINICAL DATA:  Encephalopathy and weakness. EXAM: MRI CERVICAL SPINE WITHOUT AND WITH CONTRAST MRI  THORACIC SPINE WITHOUT AND WITH CONTRAST TECHNIQUE: Multiplanar and multiecho pulse sequences of the cervical spine, to include the craniocervical junction and cervicothoracic junction, were obtained without and with intravenous contrast. CONTRAST:  9.26mL GADAVIST GADOBUTROL 1 MMOL/ML IV SOLN COMPARISON:  None. FINDINGS: CERVICAL SPINE FINDINGS Alignment: Physiologic. Vertebrae: Heterogeneous bone marrow signal without focal lesion. Cord: There is hyperintense T2-weighted signal within the cord at the cervicomedullary junction and C2 level. There is leptomeningeal contrast enhancement extending the length of the cervical spine, greatest at the cervicomedullary junction and the C2 and C3 levels. Posterior Fossa, vertebral arteries, paraspinal tissues: Posterior fossa is evaluated separately on brain MRI. No prevertebral effusion. Normal flow voids. Disc levels: C2-3: Negative C3-4: Small disc bulge without spinal canal stenosis. C4-5: Small diffuse disc bulge.  No spinal canal stenosis. C5-6: Left foraminal disc protrusion and uncovertebral hypertrophy with severe left foraminal stenosis. C6-7: Mild disc bulge without spinal canal stenosis. C7-T1: Negative THORACIC SPINE FINDINGS Alignment: Physiologic. Vertebrae: No fracture, evidence of discitis, or bone lesion. Cord: There is multifocal leptomeningeal enhancement along the length of the thoracic spinal cord. Slightly less extensive involvement of the thoracic spine and the cervical spine. Paraspinal tissues:  Negative. Disc levels: No disc herniation or stenosis. IMPRESSION: 1. Multifocal leptomeningeal contrast enhancement along the length of the cervical and thoracic spinal cord, compatible with neurosarcoid. Other forms of inflammatory or infectious encephalomyelitis are considered less likely. 2. No cervical or thoracic spinal canal stenosis. 3. 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 Electronically Signed   By: Ulyses Jarred M.D.   On: 11/14/2019 00:46   ECHOCARDIOGRAM COMPLETE  Result Date: 11/14/2019    ECHOCARDIOGRAM REPORT   Patient Name:   SHAVANNA FURNARI [facility] Date of Exam: 11/14/2019 Medical Rec #:  [id number]      Height:       63.0 in Accession #:    [id number]     Weight:       163.1 lb Date of Birth:  Oct 09, 1960      BSA:          1.773 m Patient Age:    64 years       BP:           130/89 mmHg Patient Gender: F              HR:           97 bpm. Exam Location:  Inpatient Procedure: 2D Echo Indications:    TIA  History:        Patient has prior history of Echocardiogram examinations, most                 recent 05/23/2019. Risk Factors:Hypertension.  Sonographer:    Mikki Santee RDCS (AE) Referring Phys: [id number] [place]  1. Left ventricular ejection fraction, by estimation, is 65 to 70%. The left ventricle has normal function. The left ventricle has no regional wall motion abnormalities. There is mild left ventricular hypertrophy. Left ventricular diastolic parameters were normal.  2. Right ventricular systolic function is normal. The right ventricular size is normal. There is normal pulmonary artery systolic pressure.  3. The mitral valve is normal in structure. Trivial mitral valve regurgitation.  4. The aortic valve is tricuspid. Aortic valve regurgitation is not visualized.  5. The inferior vena cava is normal in size with greater than 50% respiratory variability, suggesting right atrial pressure of 3 mmHg.  Comparison(s): No significant change from prior  study. FINDINGS  Left Ventricle: Left ventricular ejection fraction, by estimation, is 65 to 70%. The left ventricle has normal function. The left ventricle has no regional wall motion abnormalities. The left ventricular internal cavity size was normal in size. There is  mild left ventricular hypertrophy. Left ventricular diastolic parameters were normal. Right Ventricle: The right ventricular size is normal. No increase in right ventricular wall thickness. Right ventricular systolic function is normal. There is normal pulmonary artery systolic pressure. The tricuspid regurgitant velocity is 2.00 m/s, and  with an assumed right atrial pressure of 3 mmHg, the estimated right ventricular systolic pressure is 78.2 mmHg. Left Atrium: Left atrial size was normal in size. Right Atrium: Right atrial size was normal in size. Pericardium: There is no evidence of pericardial effusion. Mitral Valve: The mitral valve is normal in structure. Trivial mitral valve regurgitation. Tricuspid Valve: The tricuspid valve is normal in structure. Tricuspid valve regurgitation is trivial. Aortic Valve: The aortic valve is tricuspid. Aortic valve regurgitation is not visualized. Pulmonic Valve: The pulmonic valve was normal in structure. Pulmonic valve regurgitation is not visualized. Aorta: The aortic root is normal in size and structure. Venous: The inferior vena cava is normal in size with greater than 50% respiratory variability, suggesting right atrial pressure of 3 mmHg. IAS/Shunts: No atrial level shunt detected by color flow Doppler.  LEFT VENTRICLE PLAX 2D LVIDd:         4.00 cm  Diastology LVIDs:         2.50 cm  LV e' lateral:   6.42 cm/s LV PW:         1.20 cm  LV E/e' lateral: 12.1 LV IVS:        1.10 cm  LV e' medial:    6.20 cm/s LVOT diam:     2.00 cm  LV E/e' medial:  12.6 LV SV:         74 LV SV Index:   42 LVOT Area:     3.14 cm  RIGHT VENTRICLE RV S prime:     9.25 cm/s  TAPSE (M-mode): 2.0 cm LEFT ATRIUM             Index       RIGHT ATRIUM          Index LA diam:        2.80 cm 1.58 cm/m  RA Area:     9.14 cm LA Vol (A2C):   37.0 ml 20.87 ml/m RA Volume:   18.70 ml 10.55 ml/m LA Vol (A4C):   35.4 ml 19.96 ml/m LA Biplane Vol: 38.1 ml 21.49 ml/m  AORTIC VALVE LVOT Vmax:   123.00 cm/s LVOT Vmean:  85.700 cm/s LVOT VTI:    0.235 m  AORTA Ao Root diam: 2.60 cm MITRAL VALVE               TRICUSPID VALVE MV Area (PHT): 2.68 cm    TR Peak grad:   16.0 mmHg MV Decel Time: 283 msec    TR Vmax:        200.00 cm/s MV E velocity: 78.00 cm/s MV A velocity: 86.10 cm/s  SHUNTS MV E/A ratio:  0.91        Systemic VTI:  0.24 m                            Systemic Diam: 2.00 cm Dorris Carnes MD Electronically signed by Dorris Carnes MD Signature Date/Time:  11/14/2019/6:18:29 PM    Final    CT HEAD CODE STROKE WO CONTRAST  Result Date: 11/13/2019 CLINICAL DATA:  Code stroke.  Altered mental status EXAM: CT HEAD WITHOUT CONTRAST TECHNIQUE: Contiguous axial images were obtained from the base of the skull through the vertex without intravenous contrast. COMPARISON:  08/04/2019 FINDINGS: Brain: There is no acute intracranial hemorrhage, mass effect, or edema. No new loss of gray-white differentiation. Ventricles and sulci are normal in size and configuration. Patchy hypoattenuation in the supratentorial white matter is nonspecific but probably reflects stable chronic microvascular ischemic changes. 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 No extra-axial fluid collection. Vascular: No hyperdense vessel. Mild intracranial atherosclerotic calcification is present at the skull base. Skull: Unremarkable Sinuses/Orbits: Aerated.  Orbits are unremarkable. Other: Opacification of the inferior right mastoid air cells. ASPECTS ([place] Stroke Program Early CT Score) - Ganglionic level infarction (caudate, lentiform nuclei, internal capsule, insula, M1-M3 cortex): 7 - Supraganglionic  infarction (M4-M6 cortex): 3 Total score (0-10 with 10 being normal): 10 IMPRESSION: No acute intracranial hemorrhage or evidence of acute infarction. ASPECT score is 10. Stable chronic findings detailed above. These results were communicated to Dr. Lorraine Lax at 4:34 pmon 6/27/2021by text page via the [facility] messaging system. Electronically Signed   By: Macy Mis M.D.   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 Pt with subsequent balance, cognitive and functional deficits 1. Does the need for close, 24 hr/day medical supervision in concert with the patient's rehab needs make it unreasonable for this patient to be served in a less intensive setting? Yes 2. 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 Due to bladder management, bowel management, safety, skin/wound care, disease management, medication administration, pain management and patient education, does the patient require 24 hr/day rehab nursing? Yes 4. Does the patient require coordinated care of a physician, rehab nurse, therapy disciplines of PT, OT, SLP to address physical and functional deficits in the context of the above medical diagnosis(es)? Yes Addressing deficits in the following areas: balance, endurance, locomotion, strength, transferring, bowel/bladder control, bathing, dressing, feeding, grooming, toileting, cognition, swallowing and psychosocial support 5. Can the patient actively participate in an intensive therapy program of at least 3 hrs of therapy per day at least 5 days per week? Yes 6. The potential for patient to make measurable gains while on inpatient rehab is excellent 7. Anticipated functional outcomes upon discharge from inpatient rehab are supervision  with PT, supervision with OT,  modified independent and supervision with SLP. 8. Estimated rehab length of stay to reach the above functional goals is: 15-19 days 9. Anticipated discharge destination: Home 10. Overall Rehab/Functional Prognosis: excellent  RECOMMENDATIONS: This patient's condition is appropriate for continued rehabilitative care in the following setting: CIR Patient has agreed to participate in recommended program. Yes Note that insurance prior authorization may be required for reimbursement for recommended care.  Comment: Rehab Admissions Coordinator to follow up.  Thanks,  Meredith Staggers, MD, Mellody Drown  I have personally performed a face to face diagnostic evaluation of this patient. Additionally, I have examined pertinent labs and radiographic images. I have reviewed and concur with the physician assistant's documentation above.    Lavon Paganini Angiulli, PA-C 11/15/2019

## 2019-11-16 LAB — BASIC METABOLIC PANEL
Anion gap: 11 (ref 5–15)
BUN: 14 mg/dL (ref 6–20)
CO2: 25 mmol/L (ref 22–32)
Calcium: 9.4 mg/dL (ref 8.9–10.3)
Chloride: 98 mmol/L (ref 98–111)
Creatinine, Ser: 0.7 mg/dL (ref 0.44–1.00)
GFR calc Af Amer: >60 mL/min (ref >60)
GFR calc non Af Amer: >60 mL/min (ref >60)
Glucose, Bld: 112 mg/dL — ABNORMAL HIGH (ref 70–99)
Potassium: 4.2 mmol/L (ref 3.5–5.1)
Sodium: 134 mmol/L — ABNORMAL LOW (ref 135–145)

## 2019-11-16 LAB — CBC WITH DIFFERENTIAL/PLATELET
Abs Immature Granulocytes: 0.35 10*3/uL — ABNORMAL HIGH (ref 0.00–0.07)
Basophils Absolute: 0 10*3/uL (ref 0.0–0.1)
Basophils Relative: 0 %
Eosinophils Absolute: 0 10*3/uL (ref 0.0–0.5)
Eosinophils Relative: 0 %
HCT: 40.3 % (ref 36.0–46.0)
Hemoglobin: 13 g/dL (ref 12.0–15.0)
Immature Granulocytes: 3 %
Lymphocytes Relative: 12 %
Lymphs Abs: 1.7 10*3/uL (ref 0.7–4.0)
MCH: 27.2 pg (ref 26.0–34.0)
MCHC: 32.3 g/dL (ref 30.0–36.0)
MCV: 84.3 fL (ref 80.0–100.0)
Monocytes Absolute: 1 10*3/uL (ref 0.1–1.0)
Monocytes Relative: 7 %
Neutro Abs: 11 10*3/uL — ABNORMAL HIGH (ref 1.7–7.7)
Neutrophils Relative %: 78 %
Platelets: 420 10*3/uL — ABNORMAL HIGH (ref 150–400)
RBC: 4.78 MIL/uL (ref 3.87–5.11)
RDW: 16 % — ABNORMAL HIGH (ref 11.5–15.5)
WBC: 14 10*3/uL — ABNORMAL HIGH (ref 4.0–10.5)
nRBC: 0 % (ref 0.0–0.2)

## 2019-11-16 LAB — GLUCOSE, CAPILLARY: Glucose-Capillary: 117 mg/dL — ABNORMAL HIGH (ref 70–99)

## 2019-11-16 LAB — ANCA TITERS
Atypical P-ANCA titer: 1:160 {titer} — ABNORMAL HIGH
C-ANCA: <1:20 {titer}
P-ANCA: <1:20 {titer}

## 2019-11-16 LAB — ANGIOTENSIN CONVERTING ENZYME: Angiotensin-Converting Enzyme: 18 U/L (ref 14–82)

## 2019-11-16 LAB — NEUROMYELITIS OPTICA AUTOAB, IGG: NMO-IgG: <1.5 U/mL (ref 0.0–3.0)

## 2019-11-16 LAB — ANA: Anti Nuclear Antibody (ANA): POSITIVE — AB

## 2019-11-16 MED ORDER — ENOXAPARIN SODIUM 40 MG/0.4ML ~~LOC~~ SOLN
40.0000 mg | Freq: Every day | SUBCUTANEOUS | Status: DC
  Administered 2019-11-16 – 2019-11-28 (×13): 40 mg via SUBCUTANEOUS
  Filled 2019-11-16 (×13): qty 0.4

## 2019-11-16 NOTE — Progress Notes (Signed)
Physical Therapy Treatment Patient Details Name: Rhonda [name] MRN: [id number] DOB: 1961-04-30 Today's Date: 11/16/2019    History of Present Illness Pt is a 59 year old woman admitted 11/13/19 with AMS and agitation s/p fall with bump to the head. 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 LP pending. PMH: HTN, prediabetes, CVA Jan 2021, COVID 06/2019, arthritis, vertigo.    PT Comments    Pt in bed upon arrival of PT, agreeable to session with focus on progressing OOB mobility and gait. The pt was able to demo sig improvements in capacity for sit-stand transfers, requiring minA and RW only, and then was able to complete 2 short bouts of ambulation in the room with minA of 2 and RW. The pt remains limited by sig deficits in strength, power, and endurance that limit her ability to mobilize safely and independently. The pt will continue to benefit from PT acutely and following d/c to allow for return to prior level of function and independence.     Follow Up Recommendations  CIR;Supervision for mobility/OOB;Supervision/Assistance - 24 hour     Equipment Recommendations  None recommended by PT (defer to posr acute)    Recommendations for Other Services       Precautions / Restrictions Precautions Precautions: Fall Precaution Comments: pt with hx of vertigo, urinary incontinence Restrictions Weight Bearing Restrictions: No    Mobility  Bed Mobility Overal bed mobility: Needs Assistance Bed Mobility: Supine to Sit     Supine to sit: Min assist     General bed mobility comments: minA to raise trunk from elevated HOB. increased time and sig use of bed rails  Transfers Overall transfer level: Needs assistance Equipment used: Rolling walker (2 wheeled) Transfers: Sit to/from [facility] Sit to Stand: Min assist Stand pivot transfers: Min assist       General transfer  comment: minA to rise, VC for hand placement, then pt able to steady with use of BUE on RW. completed x10 through session  Ambulation/Gait Ambulation/Gait assistance: Min assist Gait Distance (Feet): 5 Feet (x2) Assistive device: Rolling walker (2 wheeled) Gait Pattern/deviations: Step-through pattern;Decreased stride length;Trunk flexed Gait velocity: decreased Gait velocity interpretation: <1.31 ft/sec, indicative of household ambulator General Gait Details: pt with short steps with small clearance, sig reliance on RW, limited by fatigue to ~5 ft in room, reports sig fatigue in BLE         Balance Overall balance assessment: Needs assistance Sitting-balance support: Feet supported;No upper extremity supported Sitting balance-[name]: Fair Sitting balance - Comments: minG, slight posterior lean with BLE movement   Standing balance support: During functional activity Standing balance-[name]: Poor Standing balance comment: Requires BUE support through RW                            Cognition Arousal/Alertness: Awake/alert Behavior During Therapy: WFL for tasks assessed/performed Overall Cognitive Status: Impaired/Different from baseline Area of Impairment: Problem solving                             Problem Solving: Requires verbal cues General Comments: Pt seemingly more alert and oriented to situation today, all responses and questions were appropriate in conversation             Pertinent Vitals/Pain Pain Assessment: No/denies pain Pain Intervention(s): Limited activity within patient's tolerance;Repositioned  PT Goals (current goals can now be found in the care plan section) Acute Rehab PT Goals Patient Stated Goal: to regain strength, go home PT Goal Formulation: With patient Time For Goal Achievement: 11/28/19 Potential to Achieve Goals: Fair Progress towards PT goals: Progressing toward goals    Frequency    Min  3X/week      PT Plan Current plan remains appropriate       AM-PAC PT "6 Clicks" Mobility   Outcome Measure  Help needed turning from your back to your side while in a flat bed without using bedrails?: A Little Help needed moving from lying on your back to sitting on the side of a flat bed without using bedrails?: A Little Help needed moving to and from a bed to a chair (including a wheelchair)?: A Little Help needed standing up from a chair using your arms (e.g., wheelchair or bedside chair)?: A Little Help needed to walk in hospital room?: A Little Help needed climbing 3-5 steps with a railing? : Total 6 Click Score: 16    End of Session Equipment Utilized During Treatment: Gait belt Activity Tolerance: Patient tolerated treatment well;Patient limited by fatigue Patient left: in chair;with call bell/phone within reach;with chair alarm set Nurse Communication: Mobility status PT Visit Diagnosis: Muscle weakness (generalized) (M62.81);Unsteadiness on feet (R26.81);Difficulty in walking, not elsewhere classified (R26.2)     Time: 7903-8333 PT Time Calculation (min) (ACUTE ONLY): 23 min  Charges:  $Gait Training: 8-22 mins $Therapeutic Activity: 8-22 mins                     Karma Ganja, PT, DPT   Acute Rehabilitation Department Pager #: [phone]   Otho Bellows 11/16/2019, 3:27 PM

## 2019-11-16 NOTE — Progress Notes (Signed)
PROGRESS NOTE    Rhonda [name]  KKX:[id number] DOB: [birth year] DOA: 11/13/2019 PCP: Bartholome Bill, MD    Brief Narrative:  Patient admitted to the hospital with a working diagnosis of acute demyelinating brain disease.  60 year old female who presented with altered mental status.  She has significant past medical history for hypertension, dyslipidemia, CVA (January/2021), COVID-19 (06/2019).  At home patient had an episode of vertigo, she fell and hit her head without loss of consciousness.  EMS evaluated patient and recommended outpatient follow-up.  The patient later on developed confusion and agitation.  EMS was called again and she was brought to the hospital.  On her initial physical examination temperature 99.4, blood pressure 114/81, heart rate 94, respiratory rate 21, oxygen saturation 95% on room air.  Her lungs are clear to auscultation bilaterally, heart S1-S2 present rhythmic, soft abdomen, no lower extremity edema.  She was neurologically nonfocal. Sodium 133, potassium 4.1, chloride 93, bicarb 28, glucose 98, BUN 15, creatinine 1.0, white count 12.0, hemoglobin 12.5, hematocrit 41.0, platelets 347.  SARS COVID-19 was negative.  Head CT no acute changes.  Brain MRI had changes suggestion of neurosarcoid. EKG 91 bpm, normal axis, normal intervals, sinus rhythm, ST depressions with II. III and aVF, J-point elevation V1/V2,  Assessment & Plan:   Principal Problem:   Demyelinating changes in brain [facility]) Active Problems:   Essential hypertension   Chronic low back pain   Prediabetes   Vitamin D deficiency   OSA (obstructive sleep apnea)   1. Demyelinating brain diease/ neuro sarcoid. Patient clinically has been improving no further confusion, but continue to be very weak and deconditioned. Had LP this admission with 110 wbc, <20 glucose and 592 protein.  PT OT recommends CIR. Neurology recommended to start the patient on high-dose steroids.  Currently day 1 of 1 g  Solu-Medrol.  Monitor response.  2. HTN. Continue blood pressure monitoring. Will dc IV fluids for now. Continue to hold on blood pressure medications for now to prevent hypotension.   3. Prediabetes. Close follow up of glucose.  May require sliding scale insulin.  4, Chronic back pain. Seems to be stable. Follow with PT and OT.     Status is: Inpatient  Remains inpatient appropriate because:Inpatient level of care appropriate due to severity of illness   Dispo: The patient is from: Home              Anticipated d/c is to: SNF              Anticipated d/c date is: 3 days              Patient currently is not medically stable to d/c.   DVT prophylaxis: Enoxaparin   Code Status:   full  Family Communication:  No family at bedside.     Consultants:   Neurology   Procedures:   LP   Subjective: No acute complaint.  Cough improving.  No fever no chills.  Objective: Vitals:   11/16/19 0742 11/16/19 1207 11/16/19 1628 11/16/19 1946  BP: (!) 132/91 (!) 142/87 (!) 144/98 132/90  Pulse: 94 88 92 88  Resp: 18 16 18 18   Temp: 97.6 F (36.4 C) (!) 97.5 F (36.4 C) 98.1 F (36.7 C) 98 F (36.7 C)  TempSrc: Oral Oral Oral Oral  SpO2: 100% 100% 100% 99%  Weight:      Height:        Intake/Output Summary (Last 24 hours) at 11/16/2019 1958 Last data filed  at 11/16/2019 1600 Gross per 24 hour  Intake 158.08 ml  Output 2000 ml  Net -1841.92 ml   Filed Weights   11/14/19 0500 11/15/19 0418 11/16/19 0500  Weight: 74 kg 73.5 kg 75 kg    Examination:   General: Not in pain or dyspnea, deconditioned  Neurology: Awake and alert.  Exotropia with left LR palsy and right facial droop.  Equal strength bilaterally. E ENT: mild pallor, no icterus, oral mucosa moist Cardiovascular: No JVD. S1-S2 present, rhythmic, no gallops, rubs, or murmurs. No lower extremity edema. Pulmonary: positive breath sounds bilaterally, adequate air movement, no wheezing, rhonchi or  rales. Gastrointestinal. Abdomen with no organomegaly, non tender, no rebound or guarding Skin. No rashes Musculoskeletal: no deformities.      Data Reviewed: I have personally reviewed following labs and imaging studies  CBC: Recent Labs  Lab 11/13/19 1620 11/13/19 1629 11/14/19 0109 11/16/19 0706  WBC 12.0*  --  11.2* 14.0*  NEUTROABS 9.7*  --   --  11.0*  HGB 12.5 13.9 12.4 13.0  HCT 41.0 41.0 39.0 40.3  MCV 87.4  --  85.3 84.3  PLT 347  --  339 735*   Basic Metabolic Panel: Recent Labs  Lab 11/13/19 1620 11/13/19 1629 11/14/19 0109 11/15/19 0339 11/16/19 0706  NA 132* 133* 133* 131* 134*  K 3.9 4.1 4.0 3.9 4.2  CL 93* 93* 95* 95* 98  CO2 28  --  29 27 25   GLUCOSE 100* 99 150* 96 112*  BUN 12 15 12 7 14   CREATININE 0.95 1.00 0.94 0.75 0.70  CALCIUM 9.5  --  9.3 8.7* 9.4   GFR: Estimated Creatinine Clearance: 74.3 mL/min (by C-G formula based on SCr of 0.7 mg/dL). Liver Function Tests: Recent Labs  Lab 11/13/19 1620 11/14/19 0109  AST 87* 74*  ALT 54* 52*  ALKPHOS 60 60  BILITOT 0.5 0.7  PROT 7.1 7.2  ALBUMIN 3.1* 3.0*   No results for input(s): LIPASE, AMYLASE in the last 168 hours. No results for input(s): AMMONIA in the last 168 hours. Coagulation Profile: Recent Labs  Lab 11/13/19 1620  INR 1.0   Cardiac Enzymes: No results for input(s): CKTOTAL, CKMB, CKMBINDEX, TROPONINI in the last 168 hours. BNP (last 3 results) No results for input(s): PROBNP in the last 8760 hours. HbA1C: Recent Labs    11/14/19 0109  HGBA1C 5.9*   CBG: Recent Labs  Lab 11/13/19 1620 11/14/19 0609 11/15/19 0610 11/16/19 0616  GLUCAP 97 84 101* 117*   Lipid Profile: No results for input(s): CHOL, HDL, LDLCALC, TRIG, CHOLHDL, LDLDIRECT in the last 72 hours. Thyroid Function Tests: Recent Labs    11/14/19 0109  TSH 2.037   Anemia Panel: Recent Labs    11/14/19 0109  VITAMINB12 869  FOLATE 32.8      Radiology Studies: I have reviewed all of  the imaging during this hospital visit personally     Scheduled Meds: . benzonatate  100 mg Oral TID  . enoxaparin (LOVENOX) injection  40 mg Subcutaneous Daily  . multivitamin with minerals  1 tablet Oral Daily  . pantoprazole  40 mg Oral TID AC  . sodium chloride flush  3 mL Intravenous Once  . sodium chloride flush  3 mL Intravenous Q12H   Continuous Infusions: . methylPREDNISolone (SOLU-MEDROL) injection 1,000 mg (11/16/19 0924)     LOS: 3 days        Berle Mull, MD

## 2019-11-16 NOTE — Progress Notes (Signed)
Inpatient Rehabilitation-Admissions Coordinator   [facility] met with pt bedside as follow up from PM&R MD Consult completed yesterday by Dr. Naaman Plummer. Please see consult note on 6/29 for details. Discussed recommended rehab program and benefits of CIR. Unfortunately the patient has a non-participating BCBS plan that is not in network with [facility] CIR. Pt would like to be considered for IP Rehab programs within her insurance network and has given permission to be faxed out. AC has notified TOC team and will follow at a distance in case something falls through.   Raechel Ache, OTR/L  Rehab Admissions Coordinator  [phone] 11/16/2019 11:10 AM

## 2019-11-16 NOTE — Progress Notes (Signed)
  Speech Language Pathology Treatment: Cognitive-Linquistic  Patient Details Name: Rhonda [name] MRN: [id number] DOB: [birth year] Today's Date: 11/16/2019 Time: 4462-8638 SLP Time Calculation (min) (ACUTE ONLY): 22 min  Assessment / Plan / Recommendation Clinical Impression  Pt was seen for cognitive-linguistic treatment. She was alert and cooperative throughout the session. She reported that she has noticed some changes in cognition since admission. She required cues for focused and sustained attention throughout the session. She achieved 70% accuracy with a working memory sequencing task increasing to 90% with repetition and cues. She demonstrated 60% accuracy with time management increasing to 100% with cues. She complete abstract reasoning tasks with 75% accuracy increasing to 100% with cues. SLP will continue to follow pt.    HPI HPI: Pt is a 59 year old woman admitted 11/13/19 with AMS and agitation s/p fall with bump to the head. 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 PMH: Covid  Feb?, HTN, prediabetes, CVA Jan 2021, COVID 06/2019, arthritis, vertigo.      SLP Plan  Continue with current plan of care       Recommendations                   Follow up Recommendations: Inpatient Rehab SLP Visit Diagnosis: Cognitive communication deficit (T77.116) Plan: Continue with current plan of care       Caridad Silveira I. Hardin Negus, [place], [place] Office number [phone] Pager [place] 11/16/2019, 5:07 PM

## 2019-11-17 LAB — BASIC METABOLIC PANEL
Anion gap: 8 (ref 5–15)
BUN: 17 mg/dL (ref 6–20)
CO2: 25 mmol/L (ref 22–32)
Calcium: 9.1 mg/dL (ref 8.9–10.3)
Chloride: 99 mmol/L (ref 98–111)
Creatinine, Ser: 0.85 mg/dL (ref 0.44–1.00)
GFR calc Af Amer: >60 mL/min (ref >60)
GFR calc non Af Amer: >60 mL/min (ref >60)
Glucose, Bld: 154 mg/dL — ABNORMAL HIGH (ref 70–99)
Potassium: 4.3 mmol/L (ref 3.5–5.1)
Sodium: 132 mmol/L — ABNORMAL LOW (ref 135–145)

## 2019-11-17 LAB — CBC
HCT: 40.3 % (ref 36.0–46.0)
Hemoglobin: 12.7 g/dL (ref 12.0–15.0)
MCH: 26.7 pg (ref 26.0–34.0)
MCHC: 31.5 g/dL (ref 30.0–36.0)
MCV: 84.8 fL (ref 80.0–100.0)
Platelets: 415 10*3/uL — ABNORMAL HIGH (ref 150–400)
RBC: 4.75 MIL/uL (ref 3.87–5.11)
RDW: 16 % — ABNORMAL HIGH (ref 11.5–15.5)
WBC: 18.1 10*3/uL — ABNORMAL HIGH (ref 4.0–10.5)
nRBC: 0 % (ref 0.0–0.2)

## 2019-11-17 LAB — CSF CULTURE W GRAM STAIN: Culture: NO GROWTH

## 2019-11-17 LAB — ANGIOTENSIN CONVERTING ENZYME, CSF: Angio Convert Enzyme: 4.8 U/L — ABNORMAL HIGH (ref 0.0–3.1)

## 2019-11-17 LAB — GLUCOSE, CAPILLARY: Glucose-Capillary: 144 mg/dL — ABNORMAL HIGH (ref 70–99)

## 2019-11-17 NOTE — Progress Notes (Signed)
PROGRESS NOTE    Rhonda [name]  WJX:[id number] DOB: September 09, 1960 DOA: 11/13/2019 PCP: Bartholome Bill, MD    Brief Narrative:  Patient admitted to the hospital with a working diagnosis of acute demyelinating brain disease.  59 year old female who presented with altered mental status.  She has significant past medical history for hypertension, dyslipidemia, CVA (January/2021), COVID-19 (06/2019).  At home patient had an episode of vertigo, she fell and hit her head without loss of consciousness.  EMS evaluated patient and recommended outpatient follow-up.  The patient later on developed confusion and agitation.  EMS was called again and she was brought to the hospital.  On her initial physical examination temperature 99.4, blood pressure 114/81, heart rate 94, respiratory rate 21, oxygen saturation 95% on room air.  Her lungs are clear to auscultation bilaterally, heart S1-S2 present rhythmic, soft abdomen, no lower extremity edema.  She was neurologically nonfocal. Sodium 133, potassium 4.1, chloride 93, bicarb 28, glucose 98, BUN 15, creatinine 1.0, white count 12.0, hemoglobin 12.5, hematocrit 41.0, platelets 347.  SARS COVID-19 was negative.  Head CT no acute changes.  Brain MRI had changes suggestion of neurosarcoid. EKG 91 bpm, normal axis, normal intervals, sinus rhythm, ST depressions with II. III and aVF, J-point elevation V1/V2,  Assessment & Plan:   Principal Problem:   Demyelinating changes in brain [facility]) Active Problems:   Essential hypertension   Chronic low back pain   Prediabetes   Vitamin D deficiency   OSA (obstructive sleep apnea)   1. Demyelinating brain diease/ neuro sarcoid. Patient clinically has been improving no further confusion, but continue to be very weak and deconditioned. Had LP this admission with 110 wbc, <20 glucose and 592 protein.  PT OT recommends CIR. Neurology recommended to start the patient on high-dose steroids.  Currently on 1 g Solu-Medrol.   Monitor response.  Clinically patient appears to be improving in terms of the weakness.  2. HTN. Continue blood pressure monitoring. Will dc IV fluids for now. Continue to hold on blood pressure medications for now to prevent hypotension.   3. Prediabetes. Close follow up of glucose.  May require sliding scale insulin.  4, Chronic back pain. Seems to be stable. Follow with PT and OT.     Status is: Inpatient  Remains inpatient appropriate because:Inpatient level of care appropriate due to severity of illness   Dispo: The patient is from: Home              Anticipated d/c is to: SNF              Anticipated d/c date is: 3 days              Patient currently is not medically stable to d/c.   DVT prophylaxis: Enoxaparin   Code Status:   full  Family Communication:  No family at bedside.     Consultants:   Neurology   Procedures:   LP   Subjective: No nausea no vomiting.  No acute complaints.  No fever no chills.  Objective: Vitals:   11/17/19 0402 11/17/19 0735 11/17/19 1203 11/17/19 1600  BP: 135/90 (!) 147/92 (!) 147/93 138/89  Pulse: 84 91 68 88  Resp: 18 16 18 17   Temp: 97.9 F (36.6 C) 97.6 F (36.4 C) 97.7 F (36.5 C) 97.6 F (36.4 C)  TempSrc: Oral Oral Oral Oral  SpO2: 97% 100% 100% 100%  Weight:      Height:       No intake  or output data in the 24 hours ending 11/17/19 1943 Filed Weights   11/14/19 0500 11/15/19 0418 11/16/19 0500  Weight: 74 kg 73.5 kg 75 kg    Examination:   General: Not in pain or dyspnea, deconditioned  Neurology: Awake and alert.  Exotropia with left LR palsy and right facial droop.  Equal strength bilaterally. E ENT: mild pallor, no icterus, oral mucosa moist Cardiovascular: No JVD. S1-S2 present, rhythmic, no gallops, rubs, or murmurs. No lower extremity edema. Pulmonary: positive breath sounds bilaterally, adequate air movement, no wheezing, rhonchi or rales. Gastrointestinal. Abdomen with no organomegaly, non tender,  no rebound or guarding Skin. No rashes Musculoskeletal: no deformities.      Data Reviewed: I have personally reviewed following labs and imaging studies  CBC: Recent Labs  Lab 11/13/19 1620 11/13/19 1629 11/14/19 0109 11/16/19 0706 11/17/19 0419  WBC 12.0*  --  11.2* 14.0* 18.1*  NEUTROABS 9.7*  --   --  11.0*  --   HGB 12.5 13.9 12.4 13.0 12.7  HCT 41.0 41.0 39.0 40.3 40.3  MCV 87.4  --  85.3 84.3 84.8  PLT 347  --  339 420* 017*   Basic Metabolic Panel: Recent Labs  Lab 11/13/19 1620 11/13/19 1620 11/13/19 1629 11/14/19 0109 11/15/19 0339 11/16/19 0706 11/17/19 0419  NA 132*   < > 133* 133* 131* 134* 132*  K 3.9   < > 4.1 4.0 3.9 4.2 4.3  CL 93*   < > 93* 95* 95* 98 99  CO2 28  --   --  29 27 25 25   GLUCOSE 100*   < > 99 150* 96 112* 154*  BUN 12   < > 15 12 7 14 17   CREATININE 0.95   < > 1.00 0.94 0.75 0.70 0.85  CALCIUM 9.5  --   --  9.3 8.7* 9.4 9.1   < > = values in this interval not displayed.   GFR: Estimated Creatinine Clearance: 69.9 mL/min (by C-G formula based on SCr of 0.85 mg/dL). Liver Function Tests: Recent Labs  Lab 11/13/19 1620 11/14/19 0109  AST 87* 74*  ALT 54* 52*  ALKPHOS 60 60  BILITOT 0.5 0.7  PROT 7.1 7.2  ALBUMIN 3.1* 3.0*   No results for input(s): LIPASE, AMYLASE in the last 168 hours. No results for input(s): AMMONIA in the last 168 hours. Coagulation Profile: Recent Labs  Lab 11/13/19 1620  INR 1.0   Cardiac Enzymes: No results for input(s): CKTOTAL, CKMB, CKMBINDEX, TROPONINI in the last 168 hours. BNP (last 3 results) No results for input(s): PROBNP in the last 8760 hours. HbA1C: No results for input(s): HGBA1C in the last 72 hours. CBG: Recent Labs  Lab 11/13/19 1620 11/14/19 0609 11/15/19 0610 11/16/19 0616 11/17/19 0617  GLUCAP 97 84 101* 117* 144*   Lipid Profile: No results for input(s): CHOL, HDL, LDLCALC, TRIG, CHOLHDL, LDLDIRECT in the last 72 hours. Thyroid Function Tests: No results for  input(s): TSH, T4TOTAL, FREET4, T3FREE, THYROIDAB in the last 72 hours. Anemia Panel: No results for input(s): VITAMINB12, FOLATE, FERRITIN, TIBC, IRON, RETICCTPCT in the last 72 hours.    Radiology Studies: I have reviewed all of the imaging during this hospital visit personally     Scheduled Meds:  benzonatate  100 mg Oral TID   enoxaparin (LOVENOX) injection  40 mg Subcutaneous Daily   multivitamin with minerals  1 tablet Oral Daily   pantoprazole  40 mg Oral TID AC   sodium chloride  flush  3 mL Intravenous Once   sodium chloride flush  3 mL Intravenous Q12H   Continuous Infusions:  methylPREDNISolone (SOLU-MEDROL) injection 1,000 mg (11/17/19 0914)     LOS: 4 days        Berle Mull, MD

## 2019-11-17 NOTE — Progress Notes (Signed)
Occupational Therapy Treatment Patient Details Name: Rhonda [name] MRN: [id number] DOB: 07/30/1960 Today's Date: 11/17/2019    History of present illness Pt is a 59 year old woman admitted 11/13/19 with AMS and agitation s/p fall with bump to the head. 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 LP pending. PMH: HTN, prediabetes, CVA Jan 2021, COVID 06/2019, arthritis, vertigo.   OT comments  Pt progressing towards acute OT goals. Focus of session was functional transfers and simulated toilet transfer. Pt eager to work with therapy and motivated throughout session. Daughter present at start and end of session. Continue to feel she would be excellent candidate for intensive rehab program (CIR) at time of d/c.   Follow Up Recommendations  CIR    Equipment Recommendations  None recommended by OT    Recommendations for Other Services      Precautions / Restrictions Precautions Precautions: Fall Precaution Comments: pt with hx of vertigo, urinary incontinence Restrictions Weight Bearing Restrictions: No       Mobility Bed Mobility Overal bed mobility: Needs Assistance Bed Mobility: Supine to Sit     Supine to sit: Min assist     General bed mobility comments: min A to steady. Pt utilizing UE to help fully advance BLE off EOB. utilized Development worker, international aid.   Transfers Overall transfer level: Needs assistance Equipment used: Rolling walker (2 wheeled) Transfers: Sit to/from [facility] Sit to Stand: Min assist Stand pivot transfers: Min assist       General transfer comment: min A to steady and control descent. to/from EOB and recliner. Completed 5x sit<>stands and 1x SPT. Briefly able to walk in place with mod A needed to control descent onto recliner after.     Balance Overall balance assessment: Needs assistance Sitting-balance support: Feet supported;No upper extremity  supported Sitting balance-[name]: Fair     Standing balance support: During functional activity Standing balance-[name]: Poor Standing balance comment: Requires BUE support through RW in static standing                           ADL either performed or assessed with clinical judgement   ADL Overall ADL's : Needs assistance/impaired                         Toilet Transfer: Moderate assistance;Stand-pivot;BSC Toilet Transfer Details (indicate cue type and reason): EOB to recliner. assist to steady and control descent.            General ADL Comments: Pt completed bed mobility, SPT to recliner, seated rest break then 3x sit<>stands and briefly able to walk in place on 3rd stand.      Vision       Perception     Praxis      Cognition Arousal/Alertness: Awake/alert Behavior During Therapy: WFL for tasks assessed/performed Overall Cognitive Status: Impaired/Different from baseline Area of Impairment: Problem solving                               General Comments: Answering questions appropraitely. Some difficulty word finding at times.         Exercises     Shoulder Instructions       General Comments      Pertinent Vitals/ Pain       Pain Assessment: Faces  Faces Pain Scale: Hurts a little bit Pain Location: unspecified, during transfer Pain Intervention(s): Monitored during session  Home Living                                          Prior Functioning/Environment              Frequency  Min 2X/week        Progress Toward Goals  OT Goals(current goals can now be found in the care plan section)  Progress towards OT goals: Progressing toward goals  Acute Rehab OT Goals Patient Stated Goal: to regain strength, go home OT Goal Formulation: With patient Time For Goal Achievement: 11/28/19 Potential to Achieve Goals: Good ADL Goals Pt Will Perform Upper Body Bathing: with  supervision;sitting Pt Will Perform Lower Body Bathing: sitting/lateral leans;with min assist;with adaptive equipment Pt Will Perform Upper Body Dressing: with supervision;sitting Pt Will Perform Lower Body Dressing: with min assist;sit to/from stand;with adaptive equipment Pt Will Transfer to Toilet: with supervision;stand pivot transfer;bedside commode Pt Will Perform Toileting - Clothing Manipulation and hygiene: with supervision;sitting/lateral leans;sit to/from stand Additional ADL Goal #1: Pt will perform bed mobility with min assist in preparation for ADL.  Plan Discharge plan remains appropriate    Co-evaluation                 AM-PAC OT "6 Clicks" Daily Activity     Outcome Measure   Help from another person eating meals?: None Help from another person taking care of personal grooming?: A Little Help from another person toileting, which includes using toliet, bedpan, or urinal?: Total Help from another person bathing (including washing, rinsing, drying)?: A Lot Help from another person to put on and taking off regular upper body clothing?: A Little Help from another person to put on and taking off regular lower body clothing?: A Lot 6 Click Score: 15    End of Session Equipment Utilized During Treatment: Rolling walker;Gait belt  OT Visit Diagnosis: Unsteadiness on feet (R26.81);Other abnormalities of gait and mobility (R26.89);Dizziness and giddiness (R42);Muscle weakness (generalized) (M62.81);Other symptoms and signs involving cognitive function   Activity Tolerance Patient tolerated treatment well   Patient Left in chair;with call bell/phone within reach;with chair alarm set   Nurse Communication          Time: [phone] OT Time Calculation (min): 24 min  Charges: OT General Charges $OT Visit: 1 Visit OT Treatments $Self Care/Home Management : 23-37 mins  Tyrone Schimke, OT Acute Rehabilitation Services Pager: [phone] Office:  [phone]    Hortencia Pilar 11/17/2019, 1:50 PM

## 2019-11-17 NOTE — Plan of Care (Signed)
  Problem: Health Behavior/Discharge Planning: Goal: Ability to manage health-related needs will improve Outcome: Progressing   Problem: Coping: Goal: Level of anxiety will decrease Outcome: Progressing   

## 2019-11-18 LAB — CBC
HCT: 39.7 % (ref 36.0–46.0)
Hemoglobin: 12.7 g/dL (ref 12.0–15.0)
MCH: 27.2 pg (ref 26.0–34.0)
MCHC: 32 g/dL (ref 30.0–36.0)
MCV: 85 fL (ref 80.0–100.0)
Platelets: 413 10*3/uL — ABNORMAL HIGH (ref 150–400)
RBC: 4.67 MIL/uL (ref 3.87–5.11)
RDW: 16.1 % — ABNORMAL HIGH (ref 11.5–15.5)
WBC: 16 10*3/uL — ABNORMAL HIGH (ref 4.0–10.5)
nRBC: 0 % (ref 0.0–0.2)

## 2019-11-18 LAB — BASIC METABOLIC PANEL
Anion gap: 9 (ref 5–15)
BUN: 18 mg/dL (ref 6–20)
CO2: 26 mmol/L (ref 22–32)
Calcium: 8.9 mg/dL (ref 8.9–10.3)
Chloride: 98 mmol/L (ref 98–111)
Creatinine, Ser: 0.9 mg/dL (ref 0.44–1.00)
GFR calc Af Amer: >60 mL/min (ref >60)
GFR calc non Af Amer: >60 mL/min (ref >60)
Glucose, Bld: 113 mg/dL — ABNORMAL HIGH (ref 70–99)
Potassium: 5.2 mmol/L — ABNORMAL HIGH (ref 3.5–5.1)
Sodium: 133 mmol/L — ABNORMAL LOW (ref 135–145)

## 2019-11-18 LAB — GLUCOSE, CAPILLARY: Glucose-Capillary: 107 mg/dL — ABNORMAL HIGH (ref 70–99)

## 2019-11-18 NOTE — Progress Notes (Signed)
Mobility Specialist: Progress Note    11/18/19 1757  Mobility  Activity Transferred to/from [facility]  Level of Assistance Maximum assist, patient does 25-49%  Assistive Device Front wheel walker  Mobility Response Tolerated fair  Mobility performed by Mobility specialist  $Mobility charge 1 Mobility   Pre-Mobility: 87 HR, 142/88 BP, 97% SpO2  Psychologist, clinical

## 2019-11-18 NOTE — TOC Initial Note (Signed)
Transition of Care (TOC) - Initial/Assessment Note  Marvetta Gibbons RN, BSN Transitions of Care Unit 4E- RN Case Manager See Treatment Team for direct phone #     Patient Details  Name: Rhonda [name] MRN: [id number] Date of Birth: 1961-02-25  Transition of Care [facility]) CM/SW Contact:    Dawayne Patricia, RN Phone Number: 11/18/2019, 1:39 PM  Clinical Narrative:                 Pt admitted from home, LP done on 6/28 neuro following, started on 5 days of IV steroids. Per PT/OT evals recs for INPT rehab- consult place to [facility] however they are not in network with pt's [employer] plan. Pt request referrals be sent to in-network INPT [facility] and [facility]. Calls made and faxes sent on 7/1- per conversation with pt her first preference would be for INPT rehab at [facility] as it is closer to home. Received return call back from [place] with [facility]- their medial director is declining- due to low intensity-  Call also received from [facility] with [facility]- and they are wanting updated notes from therapies- which have been faxed- plan is for [facility] INPT rehab to open case with insurance for auth - will f/u regarding auth and bed availability.   Pt is pending insurance auth for INPT rehab at [place] facility  Expected Discharge Plan: [place] Barriers to Discharge: Continued Medical Work up   Patient Goals and CMS Choice Patient states their goals for this hospitalization and ongoing recovery are:: hopeful for INPT rehab CMS Medicare.gov Compare Post Acute Care list provided to:: Patient Choice offered to / list presented to : Patient  Expected Discharge Plan and Services Expected Discharge Plan: [place]   Discharge Planning Services: CM Consult Post Acute Care Choice: IP Rehab Living arrangements for the past 2 months: Single Family Home                                      Prior Living Arrangements/Services Living arrangements for the past  2 months: Single Family Home Lives with:: Spouse Patient language and need for interpreter reviewed:: Yes Do you feel safe going back to the place where you live?: Yes      Need for Family Participation in Patient Care: Yes (Comment) Care giver support system in place?: Yes (comment)   Criminal Activity/Legal Involvement Pertinent to Current Situation/Hospitalization: No - Comment as needed  Activities of Daily Living Home Assistive Devices/Equipment: Wheelchair, Environmental consultant (specify type) ADL Screening (condition at time of admission) Patient's cognitive ability adequate to safely complete daily activities?: Yes Is the patient deaf or have difficulty hearing?: No Does the patient have difficulty seeing, even when wearing glasses/contacts?: Yes Does the patient have difficulty concentrating, remembering, or making decisions?: No Patient able to express need for assistance with ADLs?: Yes Does the patient have difficulty dressing or bathing?: No Independently performs ADLs?: No Communication: Independent Dressing (OT): Independent with device (comment) Grooming: Independent with device (comment) Feeding: Independent Bathing: Independent with device (comment) Toileting: Independent with device (comment) In/Out Bed: Independent with device (comment) Walks in Home: Needs assistance Is this a change from baseline?: Pre-admission baseline Does the patient have difficulty walking or climbing stairs?: Yes Weakness of Legs: Both Weakness of Arms/Hands: Both  Permission Sought/Granted Permission sought to share information with : Chartered certified accountant granted  to share information with : Yes, Verbal Permission Granted     Permission granted to share info w AGENCY: in network INPT rehab facilities        Emotional Assessment Appearance:: Appears stated age Attitude/Demeanor/Rapport: Engaged Affect (typically observed): Appropriate, Pleasant Orientation: : Oriented to  Self, Oriented to Place, Oriented to  Time, Oriented to Situation Alcohol / Substance Use: Not Applicable Psych Involvement: No (comment)  Admission diagnosis:  Aphasia [R47.01] TIA (transient ischemic attack) [G45.9] Weakness of both legs [R29.898] Demyelinating changes in brain [facility]) [G37.9] Patient Active Problem List   Diagnosis Date Noted  . 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 Acute CVA (cerebrovascular accident) ([place]) 05/23/2019  . Transaminitis 05/23/2019  . Nausea and vomiting 05/23/2019  . Leukocytosis 05/23/2019  . Thoracic lymphadenopathy 08/26/2018  . Axillary lymphadenopathy 08/26/2018  . Piriformis syndrome of right side 09/29/2016  . OSA (obstructive sleep apnea) 09/27/2015  . Right hip pain 04/11/2015  . Essential hypertension 11/07/2014  . Chronic low back pain 11/07/2014  . Prediabetes 11/07/2014  . Vitamin D deficiency 11/07/2014   PCP:  Bartholome Bill, MD Pharmacy:   [facility] [place], [place] - [address] AT [address] [place] [address] Phone: [phone] Fax: [phone]  [facility] [place], [place] - [address] [place] [address] Phone: [phone] Fax: [phone]     Social Determinants of Health (SDOH) Interventions    Readmission Risk Interventions No flowsheet data found.

## 2019-11-18 NOTE — Progress Notes (Signed)
°  Speech Language Pathology Treatment: Cognitive-Linquistic  Patient Details Name: Rhonda [name] MRN: [id number] DOB: 20-Sep-1960 Today's Date: 11/18/2019 Time: 2924-4628 SLP Time Calculation (min) (ACUTE ONLY): 21 min  Assessment / Plan / Recommendation Clinical Impression  Pt was seen for cognitive-linguistic treatment. She was asleep upon SLP's entry but aroused easily and was amenable to an abbreviated session. She completed a mental manipulation 4-digit sequencing task with 75% accuracy increasing to 100% with cues. She achieved 40% accuracy with a working memory task increasing to 100% with repetition. She demonstrated 100% accuracy with an executive function medication management task. SLP will continue to follow pt.    HPI HPI: Pt is a 59 year old woman admitted 11/13/19 with AMS and agitation s/p fall with bump to the head. 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 LP pending. PMH: Covid  Feb?, HTN, prediabetes, CVA Jan 2021, COVID 06/2019, arthritis, vertigo.      SLP Plan  Continue with current plan of care       Recommendations                   Follow up Recommendations: Inpatient Rehab SLP Visit Diagnosis: Cognitive communication deficit (M38.177) Plan: Continue with current plan of care       Leora Platt I. Hardin Negus, [place], [place] Office number [phone] Pager [phone]                Horton Marshall 11/18/2019, 5:20 PM

## 2019-11-18 NOTE — Progress Notes (Signed)
PROGRESS NOTE    Rhonda [name]  NUU:[id number] DOB: Sep 18, 1960 DOA: 11/13/2019 PCP: Bartholome Bill, MD    Brief Narrative:  Patient admitted to the hospital with a working diagnosis of acute demyelinating brain disease.  59 year old female who presented with altered mental status.  She has significant past medical history for hypertension, dyslipidemia, CVA (January/2021), COVID-19 (06/2019).  At home patient had an episode of vertigo, she fell and hit her head without loss of consciousness.  EMS evaluated patient and recommended outpatient follow-up.  The patient later on developed confusion and agitation.  EMS was called again and she was brought to the hospital.  On her initial physical examination temperature 99.4, blood pressure 114/81, heart rate 94, respiratory rate 21, oxygen saturation 95% on room air.  Her lungs are clear to auscultation bilaterally, heart S1-S2 present rhythmic, soft abdomen, no lower extremity edema.  She was neurologically nonfocal. Sodium 133, potassium 4.1, chloride 93, bicarb 28, glucose 98, BUN 15, creatinine 1.0, white count 12.0, hemoglobin 12.5, hematocrit 41.0, platelets 347.  SARS COVID-19 was negative.  Head CT no acute changes.  Brain MRI had changes suggestion of neurosarcoid. EKG 91 bpm, normal axis, normal intervals, sinus rhythm, ST depressions with II. III and aVF, J-point elevation V1/V2.  Assessment & Plan:  1. Demyelinating brain diease/ neuro sarcoid. Patient clinically has been improving no further confusion, but continue to be very weak and deconditioned. Had LP this admission with 110 wbc, <20 glucose and 592 protein.  PT OT recommends CIR. Neurology recommended to start the patient on high-dose steroids.  Currently on 1 g Solu-Medrol.  Monitor response.  Clinically patient appears to be improving in terms of the weakness.  2. HTN. Continue blood pressure monitoring.  Continue to hold on blood pressure medications for now to prevent  hypotension.   3. Prediabetes. Close follow up of glucose.  May require sliding scale insulin.  4, Chronic back pain. Seems to be stable. Follow with PT and OT.   5.  Cough. Continue supportive care.  Status is: Inpatient  Remains inpatient appropriate because:Inpatient level of care appropriate due to severity of illness   Dispo: The patient is from: Home              Anticipated d/c is to: SNF              Anticipated d/c date is: 3 days              Patient currently is not medically stable to d/c.   DVT prophylaxis: Enoxaparin   Code Status:   full  Family Communication:  No family at bedside.     Consultants:   Neurology   Procedures:   LP   Subjective: Feeling stronger.  Tells me that she is actually hearing better.  No nausea no vomiting.  No fever no chills.  Objective: Vitals:   11/18/19 0500 11/18/19 0826 11/18/19 1151 11/18/19 1712  BP:  (!) 126/92 128/84 (!) 138/93  Pulse:  92 100 87  Resp:  16 17 18   Temp:  97.6 F (36.4 C) 98.6 F (37 C) 98.9 F (37.2 C)  TempSrc:  Oral Oral Oral  SpO2:  100% 100% 97%  Weight: 74 kg     Height:        Intake/Output Summary (Last 24 hours) at 11/18/2019 1851 Last data filed at 11/18/2019 1300 Gross per 24 hour  Intake 498 ml  Output 850 ml  Net -352 ml   Filed  Weights   11/15/19 0418 11/16/19 0500 11/18/19 0500  Weight: 73.5 kg 75 kg 74 kg    Examination:   General: Not in pain or dyspnea, deconditioned  Neurology: Awake and alert.  Exotropia with left LR palsy and right facial droop.  Equal strength bilaterally. E ENT: mild pallor, no icterus, oral mucosa moist Cardiovascular: No JVD. S1-S2 present, rhythmic, no gallops, rubs, or murmurs. No lower extremity edema. Pulmonary: positive breath sounds bilaterally, adequate air movement, no wheezing, rhonchi or rales. Gastrointestinal. Abdomen with no organomegaly, non tender, no rebound or guarding Skin. No rashes Musculoskeletal: no deformities.       Data Reviewed: I have personally reviewed following labs and imaging studies  CBC: Recent Labs  Lab 11/13/19 1620 11/13/19 1620 11/13/19 1629 11/14/19 0109 11/16/19 0706 11/17/19 0419 11/18/19 0257  WBC 12.0*  --   --  11.2* 14.0* 18.1* 16.0*  NEUTROABS 9.7*  --   --   --  11.0*  --   --   HGB 12.5   < > 13.9 12.4 13.0 12.7 12.7  HCT 41.0   < > 41.0 39.0 40.3 40.3 39.7  MCV 87.4  --   --  85.3 84.3 84.8 85.0  PLT 347  --   --  339 420* 415* 413*   < > = values in this interval not displayed.   Basic Metabolic Panel: Recent Labs  Lab 11/14/19 0109 11/15/19 0339 11/16/19 0706 11/17/19 0419 11/18/19 0257  NA 133* 131* 134* 132* 133*  K 4.0 3.9 4.2 4.3 5.2*  CL 95* 95* 98 99 98  CO2 29 27 25 25 26   GLUCOSE 150* 96 112* 154* 113*  BUN 12 7 14 17 18   CREATININE 0.94 0.75 0.70 0.85 0.90  CALCIUM 9.3 8.7* 9.4 9.1 8.9   GFR: Estimated Creatinine Clearance: 65.6 mL/min (by C-G formula based on SCr of 0.9 mg/dL). Liver Function Tests: Recent Labs  Lab 11/13/19 1620 11/14/19 0109  AST 87* 74*  ALT 54* 52*  ALKPHOS 60 60  BILITOT 0.5 0.7  PROT 7.1 7.2  ALBUMIN 3.1* 3.0*   No results for input(s): LIPASE, AMYLASE in the last 168 hours. No results for input(s): AMMONIA in the last 168 hours. Coagulation Profile: Recent Labs  Lab 11/13/19 1620  INR 1.0   Cardiac Enzymes: No results for input(s): CKTOTAL, CKMB, CKMBINDEX, TROPONINI in the last 168 hours. BNP (last 3 results) No results for input(s): PROBNP in the last 8760 hours. HbA1C: No results for input(s): HGBA1C in the last 72 hours. CBG: Recent Labs  Lab 11/14/19 0609 11/15/19 0610 11/16/19 0616 11/17/19 0617 11/18/19 0601  GLUCAP 84 101* 117* 144* 107*   Lipid Profile: No results for input(s): CHOL, HDL, LDLCALC, TRIG, CHOLHDL, LDLDIRECT in the last 72 hours. Thyroid Function Tests: No results for input(s): TSH, T4TOTAL, FREET4, T3FREE, THYROIDAB in the last 72 hours. Anemia Panel: No  results for input(s): VITAMINB12, FOLATE, FERRITIN, TIBC, IRON, RETICCTPCT in the last 72 hours.    Radiology Studies: I have reviewed all of the imaging during this hospital visit personally     Scheduled Meds: . benzonatate  100 mg Oral TID  . enoxaparin (LOVENOX) injection  40 mg Subcutaneous Daily  . multivitamin with minerals  1 tablet Oral Daily  . pantoprazole  40 mg Oral TID AC  . sodium chloride flush  3 mL Intravenous Once  . sodium chloride flush  3 mL Intravenous Q12H   Continuous Infusions: . methylPREDNISolone (SOLU-MEDROL) injection 1,000  mg (11/18/19 0949)     LOS: 5 days        Berle Mull, MD

## 2019-11-18 NOTE — Progress Notes (Signed)
Physical Therapy Treatment Patient Details Name: Rhonda [name] MRN: [id number] DOB: 31-Jul-1960 Today's Date: 11/18/2019    History of Present Illness Pt is a 59 year old woman admitted 11/13/19 with AMS and agitation s/p fall with bump to the head. 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 Working diagnosis of working diagnosis of acute demyelinating brain disease. PMH: HTN, prediabetes, CVA Jan 2021, COVID 06/2019, arthritis, vertigo.    PT Comments    Patient progressing slowly towards PT goals. Improved ambulation distance with Min A and Rw for support. Noted to have some vertigo upon sitting EOB; cues for gaze stabilization. Noted to have generalized weakness in BLEs and overall fatigue requiring seated rest break over after 5'. Cognition seems to be improved today from prior session. Highly motivated to return to PLOF and home.  Continue to recommend CIR. Will follow.   Follow Up Recommendations  CIR;Supervision for mobility/OOB;Supervision/Assistance - 24 hour     Equipment Recommendations  None recommended by PT    Recommendations for Other Services       Precautions / Restrictions Precautions Precautions: Fall Precaution Comments: pt with hx of vertigo, urinary incontinence Restrictions Weight Bearing Restrictions: No    Mobility  Bed Mobility Overal bed mobility: Needs Assistance Bed Mobility: Supine to Sit     Supine to sit: HOB elevated;Min guard     General bed mobility comments: Increased time/effort with heavy use of rail to get to EOB.  Transfers Overall transfer level: Needs assistance Equipment used: Rolling walker (2 wheeled) Transfers: Sit to/from Stand Sit to Stand: Min assist;From elevated surface         General transfer comment: Min A to power to standing with cues for hand placement and forward momentum progressing to Min guard from chair. Uncontrolled descent into chair.  Ambulation/Gait Ambulation/Gait assistance:  Min assist Gait Distance (Feet): 6 Feet (+15') Assistive device: Rolling walker (2 wheeled) Gait Pattern/deviations: Step-through pattern;Decreased stride length;Trunk flexed;Wide base of support Gait velocity: decreased Gait velocity interpretation: <1.31 ft/sec, indicative of household ambulator General Gait Details: very slow, mildly unsteady gait with short steps and decreased foot clearance. 1 seated rest break due to fatigue/weakness.   Stairs             Wheelchair Mobility    Modified Rankin (Stroke Patients Only)       Balance Overall balance assessment: Needs assistance Sitting-balance support: Feet supported;No upper extremity supported Sitting balance-[name]: Fair Sitting balance - Comments: Min guard for safety due to vertigo upon sitting EOB. Cues for gaze stabilization.   Standing balance support: During functional activity Standing balance-[name]: Poor Standing balance comment: Requires BUE support through RW in static standing                            Cognition Arousal/Alertness: Awake/alert Behavior During Therapy: WFL for tasks assessed/performed Overall Cognitive Status: Impaired/Different from baseline Area of Impairment: Problem solving                             Problem Solving: Requires verbal cues;Slow processing General Comments: Cognitive seems improved today.      Exercises      General Comments        Pertinent Vitals/Pain Pain Assessment: No/denies pain    Home Living  Prior Function            PT Goals (current goals can now be found in the care plan section) Progress towards PT goals: Progressing toward goals    Frequency    Min 3X/week      PT Plan Current plan remains appropriate    Co-evaluation              AM-PAC PT "6 Clicks" Mobility   Outcome Measure  Help needed turning from your back to your side while in a flat bed without  using bedrails?: A Little Help needed moving from lying on your back to sitting on the side of a flat bed without using bedrails?: A Little Help needed moving to and from a bed to a chair (including a wheelchair)?: A Little Help needed standing up from a chair using your arms (e.g., wheelchair or bedside chair)?: A Little Help needed to walk in hospital room?: A Little Help needed climbing 3-5 steps with a railing? : A Lot 6 Click Score: 17    End of Session Equipment Utilized During Treatment: Gait belt Activity Tolerance: Patient limited by fatigue;Patient tolerated treatment well Patient left: in chair;with call bell/phone within reach;with chair alarm set;with nursing/sitter in room Nurse Communication: Mobility status PT Visit Diagnosis: Muscle weakness (generalized) (M62.81);Unsteadiness on feet (R26.81);Difficulty in walking, not elsewhere classified (R26.2)     Time: 2992-4268 PT Time Calculation (min) (ACUTE ONLY): 20 min  Charges:  $Gait Training: 8-22 mins                     Marisa Severin, PT, DPT Acute Rehabilitation Services Pager [phone] Office [phone]       [place] 11/18/2019, 12:35 PM

## 2019-11-19 ENCOUNTER — Inpatient Hospital Stay (HOSPITAL_COMMUNITY): Payer: BLUE CROSS/BLUE SHIELD

## 2019-11-19 LAB — BASIC METABOLIC PANEL
Anion gap: 10 (ref 5–15)
BUN: 21 mg/dL — ABNORMAL HIGH (ref 6–20)
CO2: 25 mmol/L (ref 22–32)
Calcium: 8.9 mg/dL (ref 8.9–10.3)
Chloride: 100 mmol/L (ref 98–111)
Creatinine, Ser: 0.89 mg/dL (ref 0.44–1.00)
GFR calc Af Amer: >60 mL/min (ref >60)
GFR calc non Af Amer: >60 mL/min (ref >60)
Glucose, Bld: 128 mg/dL — ABNORMAL HIGH (ref 70–99)
Potassium: 4.4 mmol/L (ref 3.5–5.1)
Sodium: 135 mmol/L (ref 135–145)

## 2019-11-19 LAB — CBC
HCT: 38.2 % (ref 36.0–46.0)
Hemoglobin: 12.1 g/dL (ref 12.0–15.0)
MCH: 26.8 pg (ref 26.0–34.0)
MCHC: 31.7 g/dL (ref 30.0–36.0)
MCV: 84.5 fL (ref 80.0–100.0)
Platelets: 387 10*3/uL (ref 150–400)
RBC: 4.52 MIL/uL (ref 3.87–5.11)
RDW: 16.1 % — ABNORMAL HIGH (ref 11.5–15.5)
WBC: 14.9 10*3/uL — ABNORMAL HIGH (ref 4.0–10.5)
nRBC: 0.1 % (ref 0.0–0.2)

## 2019-11-19 LAB — GLUCOSE, CAPILLARY: Glucose-Capillary: 103 mg/dL — ABNORMAL HIGH (ref 70–99)

## 2019-11-19 MED ORDER — IOHEXOL 9 MG/ML PO SOLN
500.0000 mL | ORAL | Status: AC
  Administered 2019-11-19 (×2): 500 mL via ORAL

## 2019-11-19 MED ORDER — IOHEXOL 300 MG/ML  SOLN
100.0000 mL | Freq: Once | INTRAMUSCULAR | Status: AC | PRN
  Administered 2019-11-19: 100 mL via INTRAVENOUS

## 2019-11-19 MED ORDER — GADOBUTROL 1 MMOL/ML IV SOLN
7.5000 mL | Freq: Once | INTRAVENOUS | Status: AC | PRN
  Administered 2019-11-19: 7.5 mL via INTRAVENOUS

## 2019-11-19 NOTE — Progress Notes (Addendum)
NEURO HOSPITALIST PROGRESS NOTE   Subjective: Patient, awake, alert, NAD, sitting up in bed smiling. Patient states she feels improved. Right  Foot and calf break gravity, but patient c/o arthritis in right side and says that affects her movement.   Exam: Vitals:   11/19/19 0335 11/19/19 0848  BP:  123/82  Pulse: 81   Resp: 19 18  Temp: 97.8 F (36.6 C) 97.9 F (36.6 C)  SpO2:      Physical Exam  Constitutional: Appears well-developed and well-nourished.  Eyes: Normal external eye and conjunctiva. HENT: Normocephalic, no lesions, without obvious abnormality.   Musculoskeletal-no joint tenderness, deformity or swelling Cardiovascular: Normal rate and regular rhythm.  Respiratory: Effort normal, non-labored breathing saturations WNL GI: Soft.  No distension. There is no tenderness.  Skin: WDI   Neuro:  Mental Status: Alert, oriented to name/age/month/year. States 5 quarters in 1.25$. naming intact. Able to follow commands. Cranial Nerves: II: VFF. PERRL.  III,IV, VI: Ptosis of left eye, left eye exotropia. EOMI.  V,VII: smile symmetric, facial light touch sensation normal bilaterally VIII: hard of hearing right ear, hears better from left side IX,X: uvula rises symmetrically XI: bilateral shoulder shrug XII: midline tongue extension Motor: Able to raise BUE anti-gravity with 5/5 strength, RLE 4-/5, LLE 5/5 Tone and bulk:normal tone throughout; no atrophy noted Sensory: Cool temp and light touch intact throughout, bilaterally Deep Tendon Reflexes: 2+ and symmetric biceps, 2+ left patellar, 3+ right patellar.  Cerebellar: Normal FNF Gait: Deferred    Medications:  Scheduled: . benzonatate  100 mg Oral TID  . enoxaparin (LOVENOX) injection  40 mg Subcutaneous Daily  . multivitamin with minerals  1 tablet Oral Daily  . pantoprazole  40 mg Oral TID AC  . sodium chloride flush  3 mL Intravenous Once  . sodium chloride flush  3 mL Intravenous Q12H    Continuous: . 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 MRI brain revealed new white matter disease in the cerebrum, brainstem and visualized upper cervical spinal cord,as well asthickening of the pituitary stalk.Follow up MRI brain with contrast, as well as cervical and thoracic MRI with and without contrast, revealed diffuse and multifocal leptomenengeal nodular enhancement. initialExam findings of multifocal motor and sensory deficits in a patchy distribution are consistent with the multifocal abnormalities seen on MRI 1. CSF interpretation:              -- CSF WBC significantly elevated at 95. Predominantly lymphocytes, which would be expected in a chronic inflammatory condition affecting the CNS, including neurosarcoidosis.               -- Glucose markedly low at < 20: This is secondary to the high number of WBC in the patient's CSF             -- Protein markedly elevated at 592. Protein levels may be highly elevated in the CSF of patients with neurosarcoidosis, especially in those with diffuse leptomeningeal involvement on MRI, as is the case with this patient.              -- CSF bacterial culture x 3  day: No growth  -- CSF fungal culture x 3 day: No fungus isolated             -- CSF gram stain: No organisms seen.             -- CSF ACE level: Abnormally elevated on 11/14/19 at 4.8             -- CSF flow cytometry and cytology studies: 2. 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 Leptomeningeal carcinomatosis is still possible, but is felt to be unlikely - flow cytometry and cytology studies reveal lymphocytosis, with no malignant cells identified 3. Elevated LFTs - ? Hepatic sarcoidosis 4. Joint pain complaints: ? Inflammatory etiology if she is experiencing joint manifestations of sarcoidosis (sarcoid arthritis).   Recommendations: 1. IV methylprednisolone, 1000 mg qd x 5 days. Today will be dose 5/5.  Monitor CBG at regular intervals as well as daily CBC and Chem7 while the patient is undergoing steroid treatment.  2. After completion of IV steroids, startprednisone 1 to 1.5 mg/kg per day for two to four weeks and then start a slow taper. Start of oral prednisone on 11/20/19 to be ordered by primary team. Per UpToDate: "A slow prednisone taper should be considered in patients who are severely compromised or for whom a chronic course is likely. One useful approach is to allow the patient to stabilize or improve with four weeks of treatment and then to decrease the prednisone dose by 5 mg every two weeks as tolerated by symptoms. Exacerbations are most common when a prednisone dose of 10 mg/day is approached; thus, the dose should be decreased by 1 mg decrements every one to two weeks when the dose is near this level." 3. Thin-slice CT of chest with and without contrast to assess for possible subclinical pulmonary sarcoidosis - has been ordered.  4. CT of abdomen and pelvis with and without contrast to assess for possible primary malignancy - has been ordered 5. Will need follow up MRI brain and cervical spinal cord with and without contrast to assess for radiological improvement after initial steroid treatment - ordered for Sunday 6. Exam has improved. Sensation is intact to face, arms, legs to LT and cool temp bilaterally. Able to raise both legs anti-gravity though right leg has drift and takes more effort.   Laurey Morale, MSN, NP-C [facility] [phone]  Addendum 11/20/19: Relatively low on the DDx but possible is neurosyphilis as well as tuberculosis given the imaging findings. CSF VDRL on the existing CSF sample as well as CSF AFB stain, skin PPD test and HIV antibodies have been ordered.   Electronically signed: Dr. Kerney Elbe 11/19/2019, 9:06 AM

## 2019-11-19 NOTE — Progress Notes (Signed)
[facility] Progress Note  Patient: Rhonda [name]    DQQ:[id number]  DOA: 11/13/2019     Date of Service: the patient was seen and examined on 11/19/2019  Brief hospital course: Patient admitted to the hospital with a working diagnosis of acute demyelinating brain disease.  59 year old female who presented with altered mental status.  She has significant past medical history for hypertension, dyslipidemia, CVA (January/2021), COVID-19 (06/2019).  At home patient had an episode of vertigo, she fell and hit her head without loss of consciousness. patient later on developed confusion and agitation. Brain MRI had changes suggestion of neurosarcoid. Patient was started on IV steroids for 5-day protocol. Currently plan is continue to monitor response to steroids.  Assessment and Plan: 1.  Neurosarcoidosis Demyelinating brain disease. MRI brain MRI T and C-spine positive for leptomeningeal contrast-enhancement compatible with neurosarcoidosis. Neurology was consulted.  Started on 1 g IV Solu-Medrol x5 days Continues to have dizziness but improving. Also had hearing impairment which is improving. Bilateral equal strength and sensation. Continue to monitor while receiving IV steroids. Per neurology after completion of IV steroids start prednisone 1 to 1.5 mg/kg/day with a slow taper. Angiotensin converting enzyme +4.8.  2.  Essential hypertension Blood pressure stable. Continue to monitor. Currently no indication for medication  3.  Prediabetes Steroid-induced hyperglycemia Continue to monitor for now.  4.  Cough No etiology identified. Continue symptomatic treatment.  5.  Mild hyperkalemia Resolved on its own. Monitor.  6.  GERD Continue PPI.  7.  Chronic OSA Not on any therapy.  Monitor.  8.  Elevated LFT CT scan recommended per neurology.  Ordered. Hep C antibody negative. HIV negative.  Diet: Cardiac diet DVT Prophylaxis: Subcutaneous Lovenox  enoxaparin  (LOVENOX) injection 40 mg Start: 11/16/19 1000    Advance goals of care discussion: Full code  Family Communication: no family was present at bedside, at the time of interview.   Disposition:  Status is: Inpatient  Remains inpatient appropriate because:IV treatments appropriate due to intensity of illness or inability to take PO   Dispo: The patient is from: Home              Anticipated d/c is to: CIR              Anticipated d/c date is: 3 days              Patient currently is not medically stable to d/c.  Subjective: No nausea no vomiting.  No fever no chills.  Improvement in dizziness.  Physical Exam:  General: Appear in mild distress, no Rash; Oral Mucosa Clear, moist. no Abnormal Neck Mass Or lumps, Conjunctiva normal  Cardiovascular: S1 and S2 Present, no Murmur, Respiratory: good respiratory effort, Bilateral Air entry present and Clear to Auscultation, no Crackles, no wheezes Abdomen: Bowel Sound present, Soft and no tenderness Extremities: no Pedal edema, no calf tenderness Neurology: alert and oriented to time, place, and person affect appropriate.  Left LR 6 palsy improving. Bilateral equal strength. Bilateral equal sensation. Right facial droop improving. Gait not checked due to patient safety concerns  Vitals:   11/19/19 0112 11/19/19 0335 11/19/19 0500 11/19/19 0848  BP: (!) 135/93   123/82  Pulse: 80 81    Resp: 18 19  18   Temp: 97.9 F (36.6 C) 97.8 F (36.6 C)  97.9 F (36.6 C)  TempSrc: Oral Oral  Oral  SpO2: 95%     Weight:   74.2 kg   Height:  Intake/Output Summary (Last 24 hours) at 11/19/2019 0915 Last data filed at 11/19/2019 0120 Gross per 24 hour  Intake 440 ml  Output 1100 ml  Net -660 ml   Filed Weights   11/16/19 0500 11/18/19 0500 11/19/19 0500  Weight: 75 kg 74 kg 74.2 kg    Data Reviewed: I have personally reviewed and interpreted daily labs, tele strips, imagings as discussed above. I reviewed all nursing notes,  pharmacy notes, vitals, pertinent old records I have discussed plan of care as described above with RN and patient/family.  CBC: Recent Labs  Lab 11/13/19 1620 11/13/19 1629 11/14/19 0109 11/16/19 0706 11/17/19 0419 11/18/19 0257 11/19/19 0315  WBC 12.0*   < > 11.2* 14.0* 18.1* 16.0* 14.9*  NEUTROABS 9.7*  --   --  11.0*  --   --   --   HGB 12.5   < > 12.4 13.0 12.7 12.7 12.1  HCT 41.0   < > 39.0 40.3 40.3 39.7 38.2  MCV 87.4   < > 85.3 84.3 84.8 85.0 84.5  PLT 347   < > 339 420* 415* 413* 387   < > = values in this interval not displayed.   Basic Metabolic Panel: Recent Labs  Lab 11/15/19 0339 11/16/19 0706 11/17/19 0419 11/18/19 0257 11/19/19 0315  NA 131* 134* 132* 133* 135  K 3.9 4.2 4.3 5.2* 4.4  CL 95* 98 99 98 100  CO2 27 25 25 26 25   GLUCOSE 96 112* 154* 113* 128*  BUN 7 14 17 18  21*  CREATININE 0.75 0.70 0.85 0.90 0.89  CALCIUM 8.7* 9.4 9.1 8.9 8.9    Studies: No results found.  Scheduled Meds: . benzonatate  100 mg Oral TID  . enoxaparin (LOVENOX) injection  40 mg Subcutaneous Daily  . multivitamin with minerals  1 tablet Oral Daily  . pantoprazole  40 mg Oral TID AC  . sodium chloride flush  3 mL Intravenous Once  . sodium chloride flush  3 mL Intravenous Q12H   Continuous Infusions: . methylPREDNISolone (SOLU-MEDROL) injection 1,000 mg (11/19/19 0906)   PRN Meds: acetaminophen **OR** acetaminophen, albuterol, menthol-cetylpyridinium, ondansetron **OR** ondansetron (ZOFRAN) IV, senna-docusate  Time spent: 35 minutes  Author: Berle Mull, MD [facility] 11/19/2019 9:15 AM  To reach On-call, see care teams to locate the attending and reach out via [email or web address]. Between 7PM-7AM, please contact night-coverage If you still have difficulty reaching the attending provider, please page the [facility] (Director on Call) for [facility] on amion for assistance.

## 2019-11-20 LAB — CBC
HCT: 38.3 % (ref 36.0–46.0)
Hemoglobin: 12.1 g/dL (ref 12.0–15.0)
MCH: 26.8 pg (ref 26.0–34.0)
MCHC: 31.6 g/dL (ref 30.0–36.0)
MCV: 84.7 fL (ref 80.0–100.0)
Platelets: 358 10*3/uL (ref 150–400)
RBC: 4.52 MIL/uL (ref 3.87–5.11)
RDW: 16.6 % — ABNORMAL HIGH (ref 11.5–15.5)
WBC: 15.1 10*3/uL — ABNORMAL HIGH (ref 4.0–10.5)
nRBC: 0.2 % (ref 0.0–0.2)

## 2019-11-20 LAB — BASIC METABOLIC PANEL
Anion gap: 7 (ref 5–15)
BUN: 20 mg/dL (ref 6–20)
CO2: 26 mmol/L (ref 22–32)
Calcium: 8.7 mg/dL — ABNORMAL LOW (ref 8.9–10.3)
Chloride: 102 mmol/L (ref 98–111)
Creatinine, Ser: 0.78 mg/dL (ref 0.44–1.00)
GFR calc Af Amer: >60 mL/min (ref >60)
GFR calc non Af Amer: >60 mL/min (ref >60)
Glucose, Bld: 117 mg/dL — ABNORMAL HIGH (ref 70–99)
Potassium: 4.5 mmol/L (ref 3.5–5.1)
Sodium: 135 mmol/L (ref 135–145)

## 2019-11-20 LAB — GLUCOSE, CAPILLARY: Glucose-Capillary: 100 mg/dL — ABNORMAL HIGH (ref 70–99)

## 2019-11-20 MED ORDER — POLYETHYLENE GLYCOL 3350 17 G PO PACK
17.0000 g | PACK | Freq: Two times a day (BID) | ORAL | Status: DC
  Administered 2019-11-20 – 2019-11-26 (×4): 17 g via ORAL
  Filled 2019-11-20 (×11): qty 1

## 2019-11-20 MED ORDER — TUBERCULIN PPD 5 UNIT/0.1ML ID SOLN
5.0000 [IU] | Freq: Once | INTRADERMAL | Status: AC
  Administered 2019-11-20: 5 [IU] via INTRADERMAL
  Filled 2019-11-20: qty 0.1

## 2019-11-20 MED ORDER — PREDNISONE 20 MG PO TABS
40.0000 mg | ORAL_TABLET | Freq: Two times a day (BID) | ORAL | Status: DC
  Administered 2019-11-20 – 2019-11-28 (×17): 40 mg via ORAL
  Filled 2019-11-20 (×18): qty 2

## 2019-11-20 NOTE — Progress Notes (Signed)
[facility] Progress Note  Patient: Rhonda [name]    GQQ:[id number]  DOA: 11/13/2019     Date of Service: the patient was seen and examined on 11/20/2019  Brief hospital course: Patient admitted to the hospital with a working diagnosis of acute demyelinating brain disease.  59 year old female who presented with altered mental status.  She has significant past medical history for hypertension, dyslipidemia, CVA (January/2021), COVID-19 (06/2019).  At home patient had an episode of vertigo, she fell and hit her head without loss of consciousness. patient later on developed confusion and agitation. Brain MRI had changes suggestion of neurosarcoid. Patient was started on IV steroids for 5-day protocol. Currently plan is await for CIR arrangements  Assessment and Plan: 1.  Neurosarcoidosis Demyelinating brain disease. MRI brain MRI T and C-spine positive for leptomeningeal contrast-enhancement compatible with neurosarcoidosis. Neurology was consulted.  Started on 1 g IV Solu-Medrol x5 days Continues to have dizziness but improving. Also had hearing impairment which is improving. Bilateral equal strength and sensation.  Treated with 5 days of IV steroids. Per neurology after completion of IV steroids start prednisone 1 to 1.5 mg/kg/day with a slow taper.  Transition to oral prednisone 80 mg daily Angiotensin converting enzyme +4.8.  2.  Essential hypertension Blood pressure stable. Continue to monitor. Currently no indication for medication  3.  Prediabetes Steroid-induced hyperglycemia Continue to monitor for now.  4.  Cough No etiology identified. Continue symptomatic treatment.  5.  Mild hyperkalemia Resolved on its own. Monitor.  6.  GERD Continue PPI.  7.  Chronic OSA Not on any therapy.  Monitor.  8.  Elevated LFT CT scan unremarkable. Hep C antibody negative. HIV negative.  Diet: Cardiac diet DVT Prophylaxis: Subcutaneous Lovenox  enoxaparin (LOVENOX)  injection 40 mg Start: 11/16/19 1000    Advance goals of care discussion: Full code  Family Communication: no family was present at bedside, at the time of interview.   Disposition:  Status is: Inpatient  Remains inpatient appropriate because:IV treatments appropriate due to intensity of illness or inability to take PO   Dispo: The patient is from: Home              Anticipated d/c is to: CIR              Anticipated d/c date is: 3 days              Patient currently is not medically stable to d/c.  Subjective: Continues to have dizziness.  No nausea no vomiting.  No other acute complaint.  Physical Exam:  General: Appear in mild distress, no Rash; Oral Mucosa Clear, moist. no Abnormal Neck Mass Or lumps, Conjunctiva normal  Cardiovascular: S1 and S2 Present, no Murmur, Respiratory: good respiratory effort, Bilateral Air entry present and Clear to Auscultation, no Crackles, no wheezes Abdomen: Bowel Sound present, Soft and no tenderness Extremities: no Pedal edema, no calf tenderness Neurology: alert and oriented to time, place, and person affect appropriate.  Left LR 6 palsy improving. Bilateral equal strength. Bilateral equal sensation. Right facial droop improving. Gait not checked due to patient safety concerns  Vitals:   11/20/19 0837 11/20/19 1244 11/20/19 1250 11/20/19 1708  BP: 136/84  128/87 136/79  Pulse: 93  99 91  Resp: 18   16  Temp: 97.6 F (36.4 C) 97.6 F (36.4 C) 97.6 F (36.4 C) 98.1 F (36.7 C)  TempSrc: Oral Oral Oral Oral  SpO2: 99%  99% 98%  Weight:  Height:        Intake/Output Summary (Last 24 hours) at 11/20/2019 1743 Last data filed at 11/20/2019 1100 Gross per 24 hour  Intake 476 ml  Output 1800 ml  Net -1324 ml   Filed Weights   11/18/19 0500 11/19/19 0500 11/20/19 0429  Weight: 74 kg 74.2 kg 75.9 kg    Data Reviewed: I have personally reviewed and interpreted daily labs, tele strips, imagings as discussed above. I reviewed  all nursing notes, pharmacy notes, vitals, pertinent old records I have discussed plan of care as described above with RN and patient/family.  CBC: Recent Labs  Lab 11/16/19 0706 11/17/19 0419 11/18/19 0257 11/19/19 0315 11/20/19 0407  WBC 14.0* 18.1* 16.0* 14.9* 15.1*  NEUTROABS 11.0*  --   --   --   --   HGB 13.0 12.7 12.7 12.1 12.1  HCT 40.3 40.3 39.7 38.2 38.3  MCV 84.3 84.8 85.0 84.5 84.7  PLT 420* 415* 413* 387 174   Basic Metabolic Panel: Recent Labs  Lab 11/16/19 0706 11/17/19 0419 11/18/19 0257 11/19/19 0315 11/20/19 0407  NA 134* 132* 133* 135 135  K 4.2 4.3 5.2* 4.4 4.5  CL 98 99 98 100 102  CO2 25 25 26 25 26   GLUCOSE 112* 154* 113* 128* 117*  BUN 14 17 18  21* 20  CREATININE 0.70 0.85 0.90 0.89 0.78  CALCIUM 9.4 9.1 8.9 8.9 8.7*    Studies: MR BRAIN W WO CONTRAST  Result Date: 11/19/2019 CLINICAL DATA:  Follow-up examination post air oils to evaluate for effectiveness of treatment; sarcoidosis. EXAM: MRI HEAD WITHOUT AND WITH CONTRAST TECHNIQUE: Multiplanar, multiecho pulse sequences of the brain and surrounding structures were obtained without and with intravenous contrast. CONTRAST:  7.50mL GADAVIST GADOBUTROL 1 MMOL/ML IV SOLN COMPARISON:  Contrast-enhanced brain MRI 11/14/2019, noncontrast brain MRI 11/13/2019. FINDINGS: Brain: Again demonstrated is relatively extensive curvilinear and nodular leptomeningeal enhancement within the supratentorial and infratentorial compartments. Although improved, supratentorial leptomeningeal enhancement is again most prominent within the anterior interhemispheric fissure. There is prominent, although improved leptomeningeal enhancement, within the posterior fossa. Persistent although improved abnormal leptomeningeal enhancement along the third, fifth, seventh and eighth cranial nerves bilaterally. Persistent thickening and abnormal enhancement of the pituitary stalk, subtly improved. Redemonstrated chronic lacunar infarct within  the paramedian left pons. Patchy T2/FLAIR hyperintense signal abnormality within the cerebral white matter appears unchanged. There has been some improvement in patchy T2/FLAIR hyperintense signal abnormality involving the brainstem. There is no acute infarct. No chronic intracranial blood products. No extra-axial fluid collection. No midline shift. Vascular: Expected proximal arterial flow voids. Skull and upper cervical spine: No focal suspicious marrow lesion. Sinuses/Orbits: Visualized orbits show no acute finding. Mild ethmoid sinus mucosal thickening. Large right mastoid effusion. IMPRESSION: Relatively extensive, although improved, supratentorial and infratentorial curvilinear and nodular leptomeningeal enhancement as detailed. This includes persistent although improved leptomeningeal enhancement along multiple cranial nerves. Findings may reflect sequela of sarcoidosis with interval steroid response. Other infectious/inflammatory leptomeningeal processes cannot be excluded. There is persistent although improved patchy signal abnormality within the brainstem. Unchanged multifocal T2 hyperintense signal changes within the supratentorial white matter. Redemonstrated chronic lacunar infarct within the paramedian left pons. Mild ethmoid sinus mucosal thickening. Large right mastoid effusion. Electronically Signed   By: [name] DO   On: 11/19/2019 18:56   MR CERVICAL SPINE W WO CONTRAST  Result Date: 11/19/2019 CLINICAL DATA:  Posterior right treatment effectiveness, sarcoidosis. EXAM: MRI CERVICAL SPINE WITHOUT AND WITH CONTRAST TECHNIQUE: Multiplanar and multiecho pulse  sequences of the cervical spine, to include the craniocervical junction and cervicothoracic junction, were obtained without and with intravenous contrast. CONTRAST:  7.66mL GADAVIST GADOBUTROL 1 MMOL/ML IV SOLN COMPARISON:  Cervical spine MRI 11/06/2019 FINDINGS: Alignment: Reversal of the expected cervical lordosis Vertebrae:  Heterogeneous marrow signal without focal suspicious osseous lesion. Cord: There is persistent T2 hyperintense signal abnormality within the cervicomedullary junction. Persistent although significantly improved patchy T2 hyperintense signal abnormality within the cervical spinal cord as compared to the prior examination of 11/13/2019, there is persistent although improved fairly extensive multifocal linear and nodular leptomeningeal enhancement along the cervical and visualized thoracic spinal cord. Posterior Fossa, vertebral arteries, paraspinal tissues: Posterior fossa better assessed on concurrently performed brain MRI. Flow voids preserved within the imaged cervical vertebral arteries. Paraspinal soft tissues unremarkable. Disc levels: Cervical spondylosis is unchanged and previously detailed on recent prior MRI 11/14/2019 IMPRESSION: Persistent patchy T2 hyperintense signal abnormality within the cervicomedullary junction. Persistent, although significantly improved, fairly extensive multifocal linear and nodular leptomeningeal enhancement along the cervical and visualized thoracic spinal cord. Findings may reflect sequela of sarcoidosis with interval steroid response. Other infectious/inflammatory leptomeningeal processes cannot be excluded. Electronically Signed   By: [name] DO   On: 11/19/2019 19:02    Scheduled Meds: . benzonatate  100 mg Oral TID  . enoxaparin (LOVENOX) injection  40 mg Subcutaneous Daily  . multivitamin with minerals  1 tablet Oral Daily  . pantoprazole  40 mg Oral TID AC  . polyethylene glycol  17 g Oral BID  . predniSONE  40 mg Oral BID WC  . sodium chloride flush  3 mL Intravenous Once  . sodium chloride flush  3 mL Intravenous Q12H   Continuous Infusions:  PRN Meds: acetaminophen **OR** acetaminophen, albuterol, menthol-cetylpyridinium, ondansetron **OR** ondansetron (ZOFRAN) IV, senna-docusate  Time spent: 35 minutes  Author: Berle Mull, MD [facility] 11/20/2019 5:43 PM  To reach On-call, see care teams to locate the attending and reach out via [email or web address]. Between 7PM-7AM, please contact night-coverage If you still have difficulty reaching the attending provider, please page the [facility] (Director on Call) for [facility] on amion for assistance.

## 2019-11-20 NOTE — Progress Notes (Signed)
Patient up to chair for lunch. Will monitor patient. Ariz Terrones, Bettina Gavia RN

## 2019-11-20 NOTE — Progress Notes (Signed)
TB skin test performed on right medial anterior forearm per Md order to be read at 06 July at 2200 hrs, also pt has been given sputum culture cup of note pt does not have a productive cough at this time. Pt is also on scheduled tessalon pearls tid scheduled

## 2019-11-20 NOTE — Progress Notes (Signed)
New orders received via Dr. Cheral Marker to do tb skin test and to get afb culture, paged Dr Lynwood Dawley on call for neurology to see if pt need to be on airborne precautions per md precautions are not needed.

## 2019-11-21 LAB — BASIC METABOLIC PANEL
Anion gap: 7 (ref 5–15)
BUN: 18 mg/dL (ref 6–20)
CO2: 28 mmol/L (ref 22–32)
Calcium: 8.6 mg/dL — ABNORMAL LOW (ref 8.9–10.3)
Chloride: 99 mmol/L (ref 98–111)
Creatinine, Ser: 0.89 mg/dL (ref 0.44–1.00)
GFR calc Af Amer: >60 mL/min (ref >60)
GFR calc non Af Amer: >60 mL/min (ref >60)
Glucose, Bld: 125 mg/dL — ABNORMAL HIGH (ref 70–99)
Potassium: 4.8 mmol/L (ref 3.5–5.1)
Sodium: 134 mmol/L — ABNORMAL LOW (ref 135–145)

## 2019-11-21 LAB — CBC
HCT: 40.4 % (ref 36.0–46.0)
Hemoglobin: 12.8 g/dL (ref 12.0–15.0)
MCH: 27.4 pg (ref 26.0–34.0)
MCHC: 31.7 g/dL (ref 30.0–36.0)
MCV: 86.5 fL (ref 80.0–100.0)
Platelets: 376 10*3/uL (ref 150–400)
RBC: 4.67 MIL/uL (ref 3.87–5.11)
RDW: 16.7 % — ABNORMAL HIGH (ref 11.5–15.5)
WBC: 14 10*3/uL — ABNORMAL HIGH (ref 4.0–10.5)
nRBC: 0.1 % (ref 0.0–0.2)

## 2019-11-21 LAB — T4, FREE: Free T4: 0.67 ng/dL (ref 0.61–1.12)

## 2019-11-21 LAB — HIV ANTIBODY (ROUTINE TESTING W REFLEX): HIV Screen 4th Generation wRfx: NONREACTIVE

## 2019-11-21 LAB — GLUCOSE, CAPILLARY: Glucose-Capillary: 108 mg/dL — ABNORMAL HIGH (ref 70–99)

## 2019-11-21 NOTE — TOC Progression Note (Signed)
Transition of Care (TOC) - Progression Note  Marvetta Gibbons RN, BSN Transitions of Care Unit 4E- RN Case Manager See Treatment Team for direct phone #    Patient Details  Name: Rhonda [name] MRN: [id number] Date of Birth: 1960/07/10  Transition of Care [facility]) CM/SW Contact  Dahlia Client, Romeo Rabon, RN Phone Number: 11/21/2019, 2:08 PM  Clinical Narrative:    Damaris Schooner with Pam at [facility]- insurance closed today- auth pending for INPT rehab stay. Per Pam they have bed availability pending insurance auth. Updated therapy and progress notes faxed per request.   Expected Discharge Plan: [place] Barriers to Discharge: Continued Medical Work up  Expected Discharge Plan and Services Expected Discharge Plan: [place]   Discharge Planning Services: CM Consult Post Acute Care Choice: IP Rehab Living arrangements for the past 2 months: Single Family Home                                       Social Determinants of Health (SDOH) Interventions    Readmission Risk Interventions No flowsheet data found.

## 2019-11-21 NOTE — Progress Notes (Signed)
[facility] Progress Note  Patient: Rhonda [name]    OJJ:[id number]  DOA: 11/13/2019     Date of Service: the patient was seen and examined on 11/21/2019  Brief hospital course: Patient admitted to the hospital with a working diagnosis of acute demyelinating brain disease.  59 year old female who presented with altered mental status.  She has significant past medical history for hypertension, dyslipidemia, CVA (January/2021), COVID-19 (06/2019).  At home patient had an episode of vertigo, she fell and hit her head without loss of consciousness. patient later on developed confusion and agitation. Brain MRI had changes suggestion of neurosarcoid. Patient was started on IV steroids for 5-day protocol. Currently plan is await for CIR arrangements  Assessment and Plan: 1.  Neurosarcoidosis Demyelinating brain disease. MRI brain MRI T and C-spine positive for leptomeningeal contrast-enhancement compatible with neurosarcoidosis. Neurology was consulted.  Started on 1 g IV Solu-Medrol x5 days Continues to have dizziness but improving. Also had hearing impairment which is improving. Bilateral equal strength and sensation.  Treated with 5 days of IV steroids. Currently on 40 mg twice daily prednisone. Prior biopsy of the lymph node positive for sarcoid. Angiotensin converting enzyme +4.8.  2.  Essential hypertension Blood pressure stable. Continue to monitor. Currently no indication for medication  3.  Prediabetes Steroid-induced hyperglycemia Continue to monitor for now.  4.  Cough No etiology identified. Continue symptomatic treatment.  5.  Mild hyperkalemia Resolved on its own. Monitor.  6.  GERD Continue PPI.  7.  Chronic OSA Not on any therapy.  Monitor.  8.  Elevated LFT CT scan unremarkable. Hep C antibody negative. HIV negative.  Diet: Cardiac diet DVT Prophylaxis: Subcutaneous Lovenox  enoxaparin (LOVENOX) injection 40 mg Start: 11/16/19 1000    Advance  goals of care discussion: Full code  Family Communication: no family was present at bedside, at the time of interview.   Disposition:  Status is: Inpatient  Remains inpatient appropriate because:IV treatments appropriate due to intensity of illness or inability to take PO   Dispo: The patient is from: Home              Anticipated d/c is to: CIR              Anticipated d/c date is: 3 days              Patient currently is not medically stable to d/c.  Subjective: Feeling stronger.  No nausea no vomiting.  No fever no chills.  No chest pain.   Physical Exam:  General: Appear in mild distress, no Rash; Oral Mucosa moist, has patch of ulceration we will monitor. no Abnormal Neck Mass Or lumps, Conjunctiva normal  Cardiovascular: S1 and S2 Present, no Murmur, Respiratory: good respiratory effort, Bilateral Air entry present and Clear to Auscultation, no Crackles, no wheezes Abdomen: Bowel Sound present, Soft and no tenderness Extremities: no Pedal edema, no calf tenderness Neurology: alert and oriented to time, place, and person affect appropriate.  Left LR 6 palsy improving. Bilateral equal strength. Bilateral equal sensation. Right facial droop improving. Gait not checked due to patient safety concerns  Vitals:   11/21/19 0529 11/21/19 0755 11/21/19 1208 11/21/19 1750  BP:  (!) 137/92 (!) 141/89 129/82  Pulse:  87 90   Resp:  18 18 18   Temp:  98.5 F (36.9 C) 98.1 F (36.7 C) 98.1 F (36.7 C)  TempSrc:  Oral Oral Oral  SpO2:  100% 98% 99%  Weight: 78.7 kg  Height:        Intake/Output Summary (Last 24 hours) at 11/21/2019 1853 Last data filed at 11/21/2019 1747 Gross per 24 hour  Intake 720 ml  Output 2800 ml  Net -2080 ml   Filed Weights   11/19/19 0500 11/20/19 0429 11/21/19 0529  Weight: 74.2 kg 75.9 kg 78.7 kg    Data Reviewed: I have personally reviewed and interpreted daily labs, tele strips, imagings as discussed above. I reviewed all nursing notes,  pharmacy notes, vitals, pertinent old records I have discussed plan of care as described above with RN and patient/family.  CBC: Recent Labs  Lab 11/16/19 0706 11/16/19 0706 11/17/19 0419 11/18/19 0257 11/19/19 0315 11/20/19 0407 11/21/19 0405  WBC 14.0*   < > 18.1* 16.0* 14.9* 15.1* 14.0*  NEUTROABS 11.0*  --   --   --   --   --   --   HGB 13.0   < > 12.7 12.7 12.1 12.1 12.8  HCT 40.3   < > 40.3 39.7 38.2 38.3 40.4  MCV 84.3   < > 84.8 85.0 84.5 84.7 86.5  PLT 420*   < > 415* 413* 387 358 376   < > = values in this interval not displayed.   Basic Metabolic Panel: Recent Labs  Lab 11/17/19 0419 11/18/19 0257 11/19/19 0315 11/20/19 0407 11/21/19 0405  NA 132* 133* 135 135 134*  K 4.3 5.2* 4.4 4.5 4.8  CL 99 98 100 102 99  CO2 25 26 25 26 28   GLUCOSE 154* 113* 128* 117* 125*  BUN 17 18 21* 20 18  CREATININE 0.85 0.90 0.89 0.78 0.89  CALCIUM 9.1 8.9 8.9 8.7* 8.6*    Studies: No results found.  Scheduled Meds: . benzonatate  100 mg Oral TID  . enoxaparin (LOVENOX) injection  40 mg Subcutaneous Daily  . multivitamin with minerals  1 tablet Oral Daily  . pantoprazole  40 mg Oral TID AC  . polyethylene glycol  17 g Oral BID  . predniSONE  40 mg Oral BID WC  . sodium chloride flush  3 mL Intravenous Once  . sodium chloride flush  3 mL Intravenous Q12H  . tuberculin  5 Units Intradermal Once   Continuous Infusions:  PRN Meds: acetaminophen **OR** acetaminophen, albuterol, menthol-cetylpyridinium, ondansetron **OR** ondansetron (ZOFRAN) IV, senna-docusate  Time spent: 35 minutes  Author: Berle Mull, MD [facility] 11/21/2019 6:53 PM  To reach On-call, see care teams to locate the attending and reach out via [email or web address]. Between 7PM-7AM, please contact night-coverage If you still have difficulty reaching the attending provider, please page the [facility] (Director on Call) for [facility] on amion for assistance.

## 2019-11-21 NOTE — Progress Notes (Addendum)
Subjective: Feels like she is improving.   Exam: Vitals:   11/21/19 0444 11/21/19 0755  BP: (!) 140/92 (!) 137/92  Pulse: 84 87  Resp: 18 18  Temp: 98.2 F (36.8 C) 98.5 F (36.9 C)  SpO2: 99% 100%   Gen: In bed, NAD Resp: non-labored breathing, no acute distress Abd: soft, nt  Neuro: MS: Awake, alert, oriented x 3.  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 At this point, I think we can be fairly confident in the diagnosis of neurosarcoidosis.   Recommendations: 1) Continue prednisone for four weeks prior to slow taper(e.g. over the course of a few months)  2) She will likely need rehab placement.  3) Check T4 as hypothalamic hypothyroidism can occur with this.  4) She will need outpatient follow up with a neurologist, she sees a neurologist at [facility] and could follow up with him or a neuroimmunologist there.  5) Likely will need outpatient pulmonary follow up for serial monitoring.   Roland Rack, MD [facility] [phone]  If 7pm- 7am, please page neurology on call as listed in [facility].

## 2019-11-21 NOTE — Progress Notes (Signed)
Physical Therapy Treatment Patient Details Name: Rhonda [name] MRN: [id number] DOB: 10/15/1960 Today's Date: 11/21/2019    History of Present Illness Pt is a 60 year old woman admitted 11/13/19 with AMS and agitation s/p fall with bump to the head. 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 Working diagnosis of working diagnosis of acute demyelinating brain disease. PMH: HTN, prediabetes, CVA Jan 2021, COVID 06/2019, arthritis, vertigo.    PT Comments    Patient awake and eager to participate in therapy this morning. Improved ambulation distance with Min A and close chair follow due to bil knee instability and fatigue. Reports RLE feels stronger but still having difficulty with LLE. Continues to have vertigo worsened with changes in position requiring time to let gaze stabilize prior to initiation of movement. Pt continues to be a great CIR candidate as she is far from functional baseline. Will follow.    Follow Up Recommendations  CIR;Supervision for mobility/OOB;Supervision/Assistance - 24 hour     Equipment Recommendations  None recommended by PT    Recommendations for Other Services       Precautions / Restrictions Precautions Precautions: Fall Precaution Comments: pt with hx of vertigo, urinary/fecal incontinence Restrictions Weight Bearing Restrictions: No    Mobility  Bed Mobility Overal bed mobility: Needs Assistance Bed Mobility: Supine to Sit     Supine to sit: Min guard;HOB elevated     General bed mobility comments: Increased time/effort with heavy use of rail to get to EOB. Difficulty getting LLE to EOB. + vertigo once sitting upright. Cues for gaze stabilization.  Transfers Overall transfer level: Needs assistance Equipment used: Rolling walker (2 wheeled) Transfers: Sit to/from Stand Sit to Stand: Min assist         General transfer comment: Min A to power to standing with cues for hand placement progressing to Min guard from  chair. Transferred to chair post ambulation.  Ambulation/Gait Ambulation/Gait assistance: Min assist;+2 safety/equipment Gait Distance (Feet): 20 Feet (+24') Assistive device: Rolling walker (2 wheeled) Gait Pattern/deviations: Step-through pattern;Decreased stride length;Trunk flexed;Wide base of support Gait velocity: decreased Gait velocity interpretation: <1.31 ft/sec, indicative of household ambulator General Gait Details: very slow, unsteady gait with short steps and decreased foot clearance. Bil knee instability esp when fatigued with left knee hyperextension thrust during stance phase. 1 seated rest break due to fatigue/weakness.   Stairs             Wheelchair Mobility    Modified Rankin (Stroke Patients Only)       Balance Overall balance assessment: Needs assistance Sitting-balance support: Feet supported;No upper extremity supported Sitting balance-[name]: Fair Sitting balance - Comments: Min guard for safety due to vertigo upon sitting EOB. Cues for gaze stabilization.   Standing balance support: During functional activity Standing balance-[name]: Poor Standing balance comment: Requires BUE support through RW in static standing due to vertigo and weakness.                            Cognition Arousal/Alertness: Awake/alert Behavior During Therapy: WFL for tasks assessed/performed Overall Cognitive Status: Within Functional Limits for tasks assessed                                 General Comments: Appears WFLs for mobility today. Able to have conversation relating to awaiting insurance approval for rehab  Exercises      General Comments        Pertinent Vitals/Pain Pain Assessment: No/denies pain    Home Living                      Prior Function            PT Goals (current goals can now be found in the care plan section) Progress towards PT goals: Progressing toward goals (slowly)     Frequency    Min 3X/week      PT Plan Current plan remains appropriate    Co-evaluation              AM-PAC PT "6 Clicks" Mobility   Outcome Measure  Help needed turning from your back to your side while in a flat bed without using bedrails?: None Help needed moving from lying on your back to sitting on the side of a flat bed without using bedrails?: A Little Help needed moving to and from a bed to a chair (including a wheelchair)?: A Little Help needed standing up from a chair using your arms (e.g., wheelchair or bedside chair)?: A Little Help needed to walk in hospital room?: A Little Help needed climbing 3-5 steps with a railing? : A Lot 6 Click Score: 18    End of Session Equipment Utilized During Treatment: Gait belt Activity Tolerance: Patient tolerated treatment well;Patient limited by fatigue Patient left: in chair;with call bell/phone within reach;with chair alarm set Nurse Communication: Mobility status PT Visit Diagnosis: Muscle weakness (generalized) (M62.81);Unsteadiness on feet (R26.81);Difficulty in walking, not elsewhere classified (R26.2)     Time: 0211-1552 PT Time Calculation (min) (ACUTE ONLY): 22 min  Charges:  $Gait Training: 8-22 mins                     Marisa Severin, PT, DPT Acute Rehabilitation Services Pager [phone] Office [place] 11/21/2019, 12:36 PM

## 2019-11-22 ENCOUNTER — Telehealth: Payer: Self-pay | Admitting: Pulmonary Disease

## 2019-11-22 LAB — GLUCOSE, CAPILLARY: Glucose-Capillary: 118 mg/dL — ABNORMAL HIGH (ref 70–99)

## 2019-11-22 MED ORDER — NYSTATIN 100000 UNIT/ML MT SUSP
5.0000 mL | Freq: Four times a day (QID) | OROMUCOSAL | Status: DC
  Administered 2019-11-22 – 2019-11-28 (×21): 500000 [IU] via ORAL
  Filled 2019-11-22 (×21): qty 5

## 2019-11-22 NOTE — Progress Notes (Signed)
[facility] Progress Note  Patient: Rhonda [name]    ZWC:[id number]  DOA: 11/13/2019     Date of Service: the patient was seen and examined on 11/22/2019  Brief hospital course: Patient admitted to the hospital with a working diagnosis of acute demyelinating brain disease.  59 year old female who presented with altered mental status.  She has significant past medical history for hypertension, dyslipidemia, CVA (January/2021), COVID-19 (06/2019).  At home patient had an episode of vertigo, she fell and hit her head without loss of consciousness. patient later on developed confusion and agitation. Brain MRI had changes suggestion of neurosarcoid. Patient was started on IV steroids for 5-day protocol. Currently plan is await for CIR arrangements  Assessment and Plan: 1.  Neurosarcoidosis Demyelinating brain disease. MRI brain MRI T and C-spine positive for leptomeningeal contrast-enhancement compatible with neurosarcoidosis. Neurology was consulted.  Started on 1 g IV Solu-Medrol x5 days Continues to have dizziness but improving. Also had hearing impairment which is improving. Bilateral equal strength and sensation. Treated with 5 days of IV steroids. Currently on 40 mg twice daily prednisone. Prior biopsy of the lymph node positive for sarcoid. Angiotensin converting enzyme +4.8.  2.  Essential hypertension Blood pressure stable. Continue to monitor. Currently no indication for medication  3.  Prediabetes Steroid-induced hyperglycemia Continue to monitor for now.  4.  Cough No etiology identified. Continue symptomatic treatment.  5.  Mild hyperkalemia Resolved on its own. Monitor.  6.  GERD Continue PPI.  7.  Chronic OSA Not on any therapy.  Monitor.  8.  Elevated LFT CT scan unremarkable. Hep C antibody negative. HIV negative.  9. Oral thrush nystain started   Diet: Cardiac diet DVT Prophylaxis: Subcutaneous Lovenox  enoxaparin (LOVENOX) injection 40 mg  Start: 11/16/19 1000   Advance goals of care discussion: Full code  Family Communication: no family was present at bedside, at the time of interview.   Disposition:  Status is: Inpatient  Remains inpatient appropriate because:IV treatments appropriate due to intensity of illness or inability to take PO   Dispo: The patient is from: Home              Anticipated d/c is to: CIR              Anticipated d/c date is: 3 days              Patient currently is not medically stable to d/c.  Subjective: no acute complains, no fever or chills.   Physical Exam:  General: Appear in mild distress, no Rash; Oral Mucosa moist, has thrush. no Abnormal Neck Mass Or lumps, Conjunctiva normal  Cardiovascular: S1 and S2 Present, no Murmur, Respiratory: good respiratory effort, Bilateral Air entry present and Clear to Auscultation, no Crackles, no wheezes Abdomen: Bowel Sound present, Soft and no tenderness Extremities: no Pedal edema, no calf tenderness Neurology: alert and oriented to time, place, and person affect appropriate.  Left LR 6 palsy improving. Bilateral equal strength. Bilateral equal sensation. Right facial droop improving. Gait not checked due to patient safety concerns  Vitals:   11/22/19 0633 11/22/19 0847 11/22/19 1127 11/22/19 1709  BP:  (!) 141/79 136/88 (!) 154/95  Pulse:  91 92   Resp:      Temp:  98.3 F (36.8 C) 97.7 F (36.5 C) 98.2 F (36.8 C)  TempSrc:  Oral Oral Oral  SpO2:  100% 100% 98%  Weight: 78.7 kg     Height:  Intake/Output Summary (Last 24 hours) at 11/22/2019 1905 Last data filed at 11/22/2019 1743 Gross per 24 hour  Intake 720 ml  Output 1550 ml  Net -830 ml   Filed Weights   11/20/19 0429 11/21/19 0529 11/22/19 0633  Weight: 75.9 kg 78.7 kg 78.7 kg    Data Reviewed: I have personally reviewed and interpreted daily labs, tele strips, imagings as discussed above. I reviewed all nursing notes, pharmacy notes, vitals, pertinent old  records I have discussed plan of care as described above with RN and patient/family.  CBC: Recent Labs  Lab 11/16/19 0706 11/16/19 0706 11/17/19 0419 11/18/19 0257 11/19/19 0315 11/20/19 0407 11/21/19 0405  WBC 14.0*   < > 18.1* 16.0* 14.9* 15.1* 14.0*  NEUTROABS 11.0*  --   --   --   --   --   --   HGB 13.0   < > 12.7 12.7 12.1 12.1 12.8  HCT 40.3   < > 40.3 39.7 38.2 38.3 40.4  MCV 84.3   < > 84.8 85.0 84.5 84.7 86.5  PLT 420*   < > 415* 413* 387 358 376   < > = values in this interval not displayed.   Basic Metabolic Panel: Recent Labs  Lab 11/17/19 0419 11/18/19 0257 11/19/19 0315 11/20/19 0407 11/21/19 0405  NA 132* 133* 135 135 134*  K 4.3 5.2* 4.4 4.5 4.8  CL 99 98 100 102 99  CO2 25 26 25 26 28   GLUCOSE 154* 113* 128* 117* 125*  BUN 17 18 21* 20 18  CREATININE 0.85 0.90 0.89 0.78 0.89  CALCIUM 9.1 8.9 8.9 8.7* 8.6*    Studies: No results found.  Scheduled Meds: . benzonatate  100 mg Oral TID  . enoxaparin (LOVENOX) injection  40 mg Subcutaneous Daily  . multivitamin with minerals  1 tablet Oral Daily  . pantoprazole  40 mg Oral TID AC  . polyethylene glycol  17 g Oral BID  . predniSONE  40 mg Oral BID WC  . sodium chloride flush  3 mL Intravenous Once  . sodium chloride flush  3 mL Intravenous Q12H  . tuberculin  5 Units Intradermal Once   Continuous Infusions:  PRN Meds: acetaminophen **OR** acetaminophen, albuterol, menthol-cetylpyridinium, ondansetron **OR** ondansetron (ZOFRAN) IV, senna-docusate  Time spent: 35 minutes  Author: Berle Mull, MD [facility] 11/22/2019 7:05 PM  To reach On-call, see care teams to locate the attending and reach out via [email or web address]. Between 7PM-7AM, please contact night-coverage If you still have difficulty reaching the attending provider, please page the [facility] (Director on Call) for [facility] on amion for assistance.

## 2019-11-22 NOTE — Progress Notes (Signed)
Occupational Therapy Treatment Patient Details Name: Rhonda [name] MRN: [id number] DOB: 02/26/1961 Today's Date: 11/22/2019    History of present illness Pt is a 59 year old woman admitted 11/13/19 with AMS and agitation s/p fall with bump to the head. 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 Working diagnosis of working diagnosis of acute demyelinating brain disease. PMH: HTN, prediabetes, CVA Jan 2021, COVID 06/2019, arthritis, vertigo.   OT comments  Patient met lying supine in bed in agreement with OT treatment session with focus on bed mobility and functional transfers as detailed below. Supine to EOB transfer with HOB flat and Min A at BLE with heavy use of bed rail. Patient able to complete anterior scoot toward EOB with increased time and Min guard. Sit to stand from EOB to RW and stand-pivot transfer to recliner with Min A for power-up and to maintain standing balance with known Hx of knee buckling. Prior to OT arrival, patient reports completing toileting, face washing, and oral hygiene. Patient would benefit from continued acute OT services to increase safety and independence with self-care tasks. Continued recommendation for post-acute inpatient rehab to maximize safety and independence with self-care tasks, functional mobility, and transfers in prep for safe return to PLOF.    Follow Up Recommendations  CIR    Equipment Recommendations  Other (comment) (Defer to next level of care)    Recommendations for Other Services      Precautions / Restrictions Precautions Precautions: Fall Precaution Comments: pt with hx of vertigo, urinary/fecal incontinence Restrictions Weight Bearing Restrictions: No       Mobility Bed Mobility Overal bed mobility: Needs Assistance Bed Mobility: Supine to Sit     Supine to sit: Min assist     General bed mobility comments: HOB flat with increased time/effort to advance BLE toward EOB. Min A to bring trunk upright.    Transfers Overall transfer level: Needs assistance Equipment used: Rolling walker (2 wheeled) Transfers: Sit to/from Stand Sit to Stand: Min assist Stand pivot transfers: Min assist       General transfer comment: Min A from slightly elevated EOB with cues for hand placement.     Balance Overall balance assessment: Needs assistance Sitting-balance support: Feet supported;No upper extremity supported Sitting balance-[name]: Fair     Standing balance support: During functional activity;Bilateral upper extremity supported Standing balance-[name]: Poor Standing balance comment: Heavy reliance on BUE on RW.                            ADL either performed or assessed with clinical judgement   ADL                                       Functional mobility during ADLs: Minimal assistance;Rolling walker General ADL Comments: Simulated toilet transfer with SPT to recliner requiring Min A and use of RW.      Vision       Perception     Praxis      Cognition Arousal/Alertness: Awake/alert Behavior During Therapy: WFL for tasks assessed/performed Overall Cognitive Status: Within Functional Limits for tasks assessed Area of Impairment: Problem solving                     Memory: Decreased short-term memory       Problem Solving: Requires verbal  cues;Slow processing          Exercises     Shoulder Instructions       General Comments      Pertinent Vitals/ Pain       Pain Assessment: No/denies pain  Home Living                                          Prior Functioning/Environment              Frequency  Min 2X/week        Progress Toward Goals  OT Goals(current goals can now be found in the care plan section)  Progress towards OT goals: Progressing toward goals  Acute Rehab OT Goals Patient Stated Goal: to regain strength, go home OT Goal Formulation: With patient Time For Goal  Achievement: 11/28/19 Potential to Achieve Goals: Good ADL Goals Pt Will Perform Upper Body Bathing: with supervision;sitting Pt Will Perform Lower Body Bathing: sitting/lateral leans;with min assist;with adaptive equipment Pt Will Perform Upper Body Dressing: with supervision;sitting Pt Will Perform Lower Body Dressing: with min assist;sit to/from stand;with adaptive equipment Pt Will Transfer to Toilet: with supervision;stand pivot transfer;bedside commode Pt Will Perform Toileting - Clothing Manipulation and hygiene: with supervision;sitting/lateral leans;sit to/from stand Additional ADL Goal #1: Pt will perform bed mobility with min assist in preparation for ADL.  Plan Discharge plan remains appropriate    Co-evaluation                 AM-PAC OT "6 Clicks" Daily Activity     Outcome Measure   Help from another person eating meals?: None Help from another person taking care of personal grooming?: A Little Help from another person toileting, which includes using toliet, bedpan, or urinal?: Total Help from another person bathing (including washing, rinsing, drying)?: A Lot Help from another person to put on and taking off regular upper body clothing?: A Little Help from another person to put on and taking off regular lower body clothing?: A Lot 6 Click Score: 15    End of Session Equipment Utilized During Treatment: Rolling walker;Gait belt  OT Visit Diagnosis: Unsteadiness on feet (R26.81);Other abnormalities of gait and mobility (R26.89);Dizziness and giddiness (R42);Muscle weakness (generalized) (M62.81);Other symptoms and signs involving cognitive function   Activity Tolerance Patient tolerated treatment well   Patient Left in chair;with call bell/phone within reach;with chair alarm set   Nurse Communication Other (comment) (BP 148/94)        Time: 1610-9604 OT Time Calculation (min): 16 min  Charges: OT General Charges $OT Visit: 1 Visit OT  Treatments $Therapeutic Activity: 8-22 mins  Braulio Kiedrowski H. OTR/L Supplemental OT, Department of rehab services [phone]   Thadius Smisek R H. 11/22/2019, 4:02 PM

## 2019-11-22 NOTE — Progress Notes (Signed)
  Speech Language Pathology Treatment:    Patient Details Name: Rhonda [name] MRN: [id number] DOB: 03/06/1961 Today's Date: 11/22/2019 Time: 5956-3875 SLP Time Calculation (min) (ACUTE ONLY): 15 min  Assessment / Plan / Recommendation Clinical Impression  Pt was seen for cognitive-linguistic treatment. She completed a mental manipulation 4-digit sequencing task with 100% given single cue to repeat sequence prior to re-ordering. She achieved 90% acc'y on working memory task using words versus numbers. Pt reports feeling her cognition is returning to normal. She completed sustained attention/recall task of listening to paragraph information then answering related questions with acc'y in 4/5, increased to 5/5 given semantic cue. No further ST indicated on acute level. Pt may benefit from further ST targeting executive functions at next level of care. Family present at end of session confirming plans to supervise IADL tasks in the immediate situation.   HPI HPI: Pt is a 59 year old woman admitted 11/13/19 with AMS and agitation s/p fall with bump to the head. 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 LP pending. PMH: Covid  Feb?, HTN, prediabetes, CVA Jan 2021, COVID 06/2019, arthritis, vertigo.      SLP Plan  All goals met;Discharge SLP treatment due to (comment)       Recommendations   ST for executive functions at next level of care                Follow up Recommendations: Inpatient Rehab SLP Visit Diagnosis: Cognitive communication deficit (R41.841) Plan: All goals met;Discharge SLP treatment due to (comment)                     Cheris Tweten P. Jamyrah Saur, M.S., CCC-SLP Speech-Language Pathologist Acute Rehabilitation Services Pager: [employer] 11/22/2019, 12:32 PM

## 2019-11-22 NOTE — Progress Notes (Signed)
TB skin test on pt right anterior forearm that was placed on 7/04 at 22:00 read at 9:10 pm. No induration observed. Charted in PPD result flow sheet.

## 2019-11-22 NOTE — Telephone Encounter (Signed)
Staff message sent by Dr. Valeta Harms: Garner Nash, DO  P [facility] Triage Pool; Margaretha Seeds, MD Hello,  Please schedule new consultation with Opal Sidles upon her return to the office. This lady has a known dx of sarcoidosis as well as neurosarcoid. Would like to establish care with pulmonary in the coming months here in [place].  Thanks  Garner Nash, DO  [facility]  11/21/2019 11:24 AM     Routing to [employer] so they can help [place] get pt scheduled for a consult appt with Dr. Loanne Drilling.

## 2019-11-23 NOTE — Progress Notes (Signed)
[facility]  PROGRESS NOTE  Rhonda [name] WCH:[id number] DOB: 1960-12-27 DOA: 11/13/2019 PCP: Bartholome Bill, MD   Brief HPI:   59 year old female with history of hypertension, dyslipidemia, CVA in January 2021, COVID-19 in February 2021, presented to hospital with altered mental status.  At home patient had episode of vertigo, she fell and hit her head without loss of consciousness.  Patient return developed confusion and agitation.  Brain MRI showed changes suggestive of neurosarcoid.  Patient was started on IV steroid for 5-day protocol.  Currently waiting for CIR.    Subjective   Patient seen and examined, denies any complaints.  She is alert oriented x3.   Assessment/Plan:     1. Neurosarcoidosis-neurology recommends to continue prednisone 40 mg p.o. twice daily for 4 weeks prior to slow taper.  She will need taper of over the course of few months.  Awaiting to go to CIR for rehab.  She will need follow-up with neurology as outpatient.  Patient sees a neurologist at [facility].  CSF ACE level mildly elevated at 4.8, serum ACE is normal. 2. Hypertension-blood pressure is stable, currently on no medications. 3. Steroid-induced hyperglycemia-patient blood glucose is stable.  We will continue to monitor. 4. Chronic OSA-not on any therapy. 5. Elevated LFTs-CT scan unremarkable, hep C antibody negative.  HIV negative. 6. Oral thrush-steroid-induced, started on nystatin.    SpO2: 100 %   COVID-19 Labs  No results for input(s): DDIMER, FERRITIN, LDH, CRP in the last 72 hours.  Lab Results  Component Value Date   [place] NEGATIVE 11/13/2019   [place] NEGATIVE 05/23/2019     CBG: Recent Labs  Lab 11/18/19 0601 11/19/19 0642 11/20/19 0537 11/21/19 0635 11/22/19 0634  GLUCAP 107* 103* 100* 108* 118*    CBC: Recent Labs  Lab 11/17/19 0419 11/18/19 0257 11/19/19 0315 11/20/19 0407 11/21/19 0405  WBC 18.1* 16.0* 14.9* 15.1* 14.0*  HGB 12.7 12.7  12.1 12.1 12.8  HCT 40.3 39.7 38.2 38.3 40.4  MCV 84.8 85.0 84.5 84.7 86.5  PLT 415* 413* 387 358 353    Basic Metabolic Panel: Recent Labs  Lab 11/17/19 0419 11/18/19 0257 11/19/19 0315 11/20/19 0407 11/21/19 0405  NA 132* 133* 135 135 134*  K 4.3 5.2* 4.4 4.5 4.8  CL 99 98 100 102 99  CO2 25 26 25 26 28   GLUCOSE 154* 113* 128* 117* 125*  BUN 17 18 21* 20 18  CREATININE 0.85 0.90 0.89 0.78 0.89  CALCIUM 9.1 8.9 8.9 8.7* 8.6*     DVT prophylaxis: Lovenox  Code Status: Full code  Family Communication: No family at bedside    Status is: Inpatient  Dispo: The patient is from: Home              Anticipated d/c is to: CIR              Anticipated d/c date is: 11/25/2019              Patient currently medically stable for discharge  Barrier to discharge-awaiting bed at CIR.        Scheduled medications:  . benzonatate  100 mg Oral TID  . enoxaparin (LOVENOX) injection  40 mg Subcutaneous Daily  . multivitamin with minerals  1 tablet Oral Daily  . nystatin  5 mL Oral QID  . pantoprazole  40 mg Oral TID AC  . polyethylene glycol  17 g Oral BID  . predniSONE  40 mg Oral BID WC  . sodium  chloride flush  3 mL Intravenous Once  . sodium chloride flush  3 mL Intravenous Q12H    Consultants:  Neurology  Procedures:  None  Antibiotics:   Anti-infectives (From admission, onward)   None       Objective   Vitals:   11/23/19 0509 11/23/19 0653 11/23/19 0803 11/23/19 1156  BP: 129/82  (!) 148/92 130/75  Pulse: 91  87 98  Resp: 16  18 14   Temp: 98.7 F (37.1 C)  97.6 F (36.4 C) 97.9 F (36.6 C)  TempSrc: Oral  Axillary Oral  SpO2: 97%  100% 100%  Weight:  78.8 kg    Height:        Intake/Output Summary (Last 24 hours) at 11/23/2019 1611 Last data filed at 11/23/2019 9371 Gross per 24 hour  Intake 240 ml  Output 2250 ml  Net -2010 ml    07/05 1901 - 07/07 0700 In: 720 [P.O.:720] Out: 2550 [Urine:2550]  Filed Weights   11/21/19 0529  11/22/19 0633 11/23/19 0653  Weight: 78.7 kg 78.7 kg 78.8 kg    Physical Examination:    General: Appears in no acute distress  Cardiovascular: S1-S2, regular, no murmur auscultated  Respiratory: Clear to auscultation bilaterally, no wheezing or crackles auscultated  Abdomen: Abdomen is soft, nontender, no organomegaly  Extremities: No edema in the lower extremities  Neurologic: Alert, oriented x3, left 6th nerve palsy    Data Reviewed:   Recent Results (from the past 240 hour(s))  SARS Coronavirus 2 by RT PCR (hospital order, performed in [place] hospital lab) Nasopharyngeal Nasopharyngeal Swab     Status: None   Collection Time: 11/13/19 10:38 PM   Specimen: Nasopharyngeal Swab  Result Value Ref Range Status   SARS Coronavirus 2 NEGATIVE NEGATIVE Final    Comment: (NOTE) SARS-CoV-2 target nucleic acids are NOT DETECTED.  The SARS-CoV-2 RNA is generally detectable in upper and lower respiratory specimens during the acute phase of infection. The lowest concentration of SARS-CoV-2 viral copies this assay can detect is 250 copies / mL. A negative result does not preclude SARS-CoV-2 infection and should not be used as the sole basis for treatment or other patient management decisions.  A negative result may occur with improper specimen collection / handling, submission of specimen other than nasopharyngeal swab, presence of viral mutation(s) within the areas targeted by this assay, and inadequate number of viral copies (<250 copies / mL). A negative result must be combined with clinical observations, patient history, and epidemiological information.  Fact Sheet for Patients:   StrictlyIdeas.no  Fact Sheet for Healthcare Providers: BankingDealers.co.za  This test is not yet approved or  cleared by the [place] FDA and has been authorized for detection and/or diagnosis of SARS-CoV-2 by FDA under an Emergency Use  Authorization (EUA).  This EUA will remain in effect (meaning this test can be used) for the duration of the COVID-19 declaration under Section 564(b)(1) of the Act, 21 U.S.C. section 360bbb-3(b)(1), unless the authorization is terminated or revoked sooner.  Performed at [facility], [address], [place], [place] [address]   CSF culture with Stat gram stain     Status: None   Collection Time: 11/14/19  1:29 PM   Specimen: CSF; Cerebrospinal Fluid  Result Value Ref Range Status   Specimen Description CSF  Final   Special Requests NONE  Final   Gram Stain   Final    WBC PRESENT, PREDOMINANTLY MONONUCLEAR NO ORGANISMS SEEN CYTOSPIN SMEAR  Culture   Final    NO GROWTH 3 DAYS Performed at [facility], [place] [address] [place], [place] [address]    Report Status 11/17/2019 FINAL  Final  Culture, fungus without smear     Status: None (Preliminary result)   Collection Time: 11/14/19  1:29 PM   Specimen: CSF; Other  Result Value Ref Range Status   Specimen Description CSF  Final   Special Requests NONE  Final   Culture   Final    NO FUNGUS ISOLATED AFTER 9 DAYS Performed at [facility], [address], [place], [place] [address]    Report Status PENDING  Incomplete     Studies:  No results found.     Oswald Hillock   [facility] If 7PM-7AM, please contact night-coverage at [email or web address], Office  [phone]   11/23/2019, 4:11 PM  LOS: 10 days

## 2019-11-23 NOTE — Progress Notes (Signed)
Occupational Therapy Treatment Patient Details Name: Rhonda [name] MRN: [id number] DOB: 25-Sep-1960 Today's Date: 11/23/2019    History of present illness Pt is a 59 year old woman admitted 11/13/19 with AMS and agitation s/p fall with bump to the head. 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 Working diagnosis of working diagnosis of acute demyelinating brain disease. PMH: HTN, prediabetes, CVA Jan 2021, COVID 06/2019, arthritis, vertigo.   OT comments  Patient met lying supine in bed in agreement with OT treatment session with focus on bed mobility, functional tranfers, and household mobility with use of RW. Supine to EOB with Min A and increased time for advancement of BLE toward EOB. Patient tolerated functional mobility with RW and 2 seated rest breaks ~20-77f in room this date. Patient continues to be limited by decreased activity tolerance, generalized weakness, and increased need for assistance from other with self-care tasks. Patient would benefit from continued acute OT services in prep for d/c to next level of care with recommendation for intensive inpatient rehab.    Follow Up Recommendations  CIR    Equipment Recommendations  Other (comment) (Defer to next level of care)    Recommendations for Other Services Rehab consult    Precautions / Restrictions Precautions Precautions: Fall Precaution Comments: pt with hx of vertigo, urinary/fecal incontinence Restrictions Weight Bearing Restrictions: No       Mobility Bed Mobility Overal bed mobility: Needs Assistance Bed Mobility: Supine to Sit     Supine to sit: Min assist     General bed mobility comments: HOB flat with increased time/effort to advance BLE toward EOB. Min A to bring trunk upright.   Transfers Overall transfer level: Needs assistance Equipment used: Rolling walker (2 wheeled) Transfers: Sit to/from Stand Sit to Stand: Min assist Stand pivot transfers: Min assist        General transfer comment: Functional mobility in prep for BADLs ~20-286fwith 2 seated rest breaks.     Balance Overall balance assessment: Needs assistance Sitting-balance support: Feet supported;No upper extremity supported Sitting balance-[name]: Fair     Standing balance support: During functional activity;Bilateral upper extremity supported Standing balance-[name]: Poor Standing balance comment: Heavy reliance on BUE on RW.                            ADL either performed or assessed with clinical judgement   ADL                                       Functional mobility during ADLs: Minimal assistance;Rolling walker       Vision       Perception     Praxis      Cognition Arousal/Alertness: Awake/alert Behavior During Therapy: WFL for tasks assessed/performed Overall Cognitive Status: Within Functional Limits for tasks assessed Area of Impairment: Problem solving                     Memory: Decreased short-term memory       Problem Solving: Requires verbal cues;Slow processing          Exercises     Shoulder Instructions       General Comments      Pertinent Vitals/ Pain       Pain Assessment: No/denies pain  Home Living  Prior Functioning/Environment              Frequency  Min 2X/week        Progress Toward Goals  OT Goals(current goals can now be found in the care plan section)  Progress towards OT goals: Progressing toward goals  Acute Rehab OT Goals Patient Stated Goal: To go to inpatient rehab OT Goal Formulation: With patient Time For Goal Achievement: 11/28/19 Potential to Achieve Goals: Good ADL Goals Pt Will Perform Upper Body Bathing: with supervision;sitting Pt Will Perform Lower Body Bathing: sitting/lateral leans;with min assist;with adaptive equipment Pt Will Perform Upper Body Dressing: with  supervision;sitting Pt Will Perform Lower Body Dressing: with min assist;sit to/from stand;with adaptive equipment Pt Will Transfer to Toilet: with supervision;stand pivot transfer;bedside commode Pt Will Perform Toileting - Clothing Manipulation and hygiene: with supervision;sitting/lateral leans;sit to/from stand Additional ADL Goal #1: Pt will perform bed mobility with min assist in preparation for ADL.  Plan Discharge plan remains appropriate    Co-evaluation                 AM-PAC OT "6 Clicks" Daily Activity     Outcome Measure   Help from another person eating meals?: None Help from another person taking care of personal grooming?: A Little Help from another person toileting, which includes using toliet, bedpan, or urinal?: Total Help from another person bathing (including washing, rinsing, drying)?: A Lot Help from another person to put on and taking off regular upper body clothing?: A Little Help from another person to put on and taking off regular lower body clothing?: A Lot 6 Click Score: 15    End of Session Equipment Utilized During Treatment: Rolling walker;Gait belt  OT Visit Diagnosis: Unsteadiness on feet (R26.81);Other abnormalities of gait and mobility (R26.89);Dizziness and giddiness (R42);Muscle weakness (generalized) (M62.81);Other symptoms and signs involving cognitive function   Activity Tolerance Patient tolerated treatment well   Patient Left in chair;with call bell/phone within reach;with chair alarm set   Nurse Communication          Time: 1025-4862 OT Time Calculation (min): 23 min  Charges: OT General Charges $OT Visit: 1 Visit OT Treatments $Therapeutic Activity: 23-37 mins  Clinton Wahlberg H. OTR/L Supplemental OT, Department of rehab services [phone]   [place] Cassetta R H. 11/23/2019, 1:56 PM

## 2019-11-23 NOTE — Progress Notes (Signed)
Physical Therapy Treatment Patient Details Name: Rhonda [name] MRN: [id number] DOB: 20-Nov-1960 Today's Date: 11/23/2019    History of Present Illness Pt is a 59 year old woman admitted 11/13/19 with AMS and agitation s/p fall with bump to the head. 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 Working diagnosis of working diagnosis of acute demyelinating brain disease. PMH: HTN, prediabetes, CVA Jan 2021, COVID 06/2019, arthritis, vertigo.    PT Comments    Patient progressing and this session able to achieve >30' on last trial of ambulation.  She continues to demonstrate LE weakness, though she feels this is getting better.  However, still has dizziness (initially reported unchanged from onset, but the reported no longer spinning, just light headed and progressively worse with ambulation. )  May benefit from habituation therapy.  PT to continue to follow acutely.  Remains appropriate for CIR level rehab at d/c.   Follow Up Recommendations  CIR;Supervision for mobility/OOB;Supervision/Assistance - 24 hour     Equipment Recommendations  None recommended by PT    Recommendations for Other Services       Precautions / Restrictions Precautions Precautions: Fall Precaution Comments: pt with hx of vertigo, urinary/fecal incontinence    Mobility  Bed Mobility               General bed mobility comments: up in chair  Transfers Overall transfer level: Needs assistance Equipment used: Rolling walker (2 wheeled) Transfers: Sit to/from Stand Sit to Stand: Min assist;Mod assist         General transfer comment: varied over session needing at times lifting help, increased time and reviewing hand placement  Ambulation/Gait Ambulation/Gait assistance: Min assist;+2 safety/equipment Gait Distance (Feet): 32 Feet (& 20' x 2) Assistive device: Rolling walker (2 wheeled) Gait Pattern/deviations: Step-through pattern;Decreased stride length;Trunk flexed;Wide base  of support     General Gait Details: seated rest x 2, but able to go further on straight away in hallway.  She reports thigh weakness and needs to sit   Stairs             Wheelchair Mobility    Modified Rankin (Stroke Patients Only)       Balance Overall balance assessment: Needs assistance Sitting-balance support: Feet supported Sitting balance-[name]: Fair     Standing balance support: Bilateral upper extremity supported Standing balance-[name]: Poor Standing balance comment: Heavy reliance on BUE on RW.                             Cognition Arousal/Alertness: Awake/alert Behavior During Therapy: WFL for tasks assessed/performed Overall Cognitive Status: Within Functional Limits for tasks assessed                                        Exercises      General Comments General comments (skin integrity, edema, etc.): felt she needed to urinate, so transferred to [facility] initially, but then lost the urge/sensation      Pertinent Vitals/Pain Pain Assessment: No/denies pain    Home Living                      Prior Function            PT Goals (current goals can now be found in the care plan section) Progress towards PT goals: Progressing  toward goals    Frequency    Min 3X/week      PT Plan Current plan remains appropriate    Co-evaluation              AM-PAC PT "6 Clicks" Mobility   Outcome Measure  Help needed turning from your back to your side while in a flat bed without using bedrails?: None Help needed moving from lying on your back to sitting on the side of a flat bed without using bedrails?: A Little Help needed moving to and from a bed to a chair (including a wheelchair)?: A Little Help needed standing up from a chair using your arms (e.g., wheelchair or bedside chair)?: A Lot Help needed to walk in hospital room?: A Lot Help needed climbing 3-5 steps with a railing? : A Lot 6 Click  Score: 16    End of Session Equipment Utilized During Treatment: Gait belt Activity Tolerance: Patient tolerated treatment well;Patient limited by fatigue Patient left: with call bell/phone within reach;in chair;with chair alarm set   PT Visit Diagnosis: Muscle weakness (generalized) (M62.81);Unsteadiness on feet (R26.81);Other abnormalities of gait and mobility (R26.89)     Time: 8786-7672 PT Time Calculation (min) (ACUTE ONLY): 27 min  Charges:  $Gait Training: 23-37 mins                     Rhonda [name], PT Acute Rehabilitation Services Pager:[phone] [phone] 11/23/2019    Reginia Naas 11/23/2019, 6:06 PM

## 2019-11-24 LAB — VDRL, CSF: VDRL Quant, CSF: NONREACTIVE

## 2019-11-24 NOTE — Progress Notes (Signed)
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  At home patient had episode of vertigo, she fell and hit her head without loss of consciousness.  Patient return developed confusion and agitation.  Brain MRI showed changes suggestive of neurosarcoid.  Patient was started on IV steroid for 5-day protocol.  Currently waiting for CIR.    Subjective   Patient seen and examined, denies chest pain or shortness of breath.  She is alert oriented x3.   Assessment/Plan:     1. Neurosarcoidosis-neurology recommends to continue prednisone 40 mg p.o. twice daily for 4 weeks prior to slow taper.  She will need taper of over the course of few months.  Awaiting to go to CIR for rehab.  She will need follow-up with neurology as outpatient.  Patient sees a neurologist at [facility].  CSF ACE level mildly elevated at 4.8, serum ACE is normal. 2. Hypertension-blood pressure is stable, currently on no medications. 3. Steroid-induced hyperglycemia-patient blood glucose is stable.  We will continue to monitor. 4. Chronic OSA-not on any therapy. 5. Elevated LFTs-CT scan unremarkable, hep C antibody negative.  HIV negative. 6. Oral thrush-steroid-induced, started on nystatin.    SpO2: 99 %   COVID-19 Labs  No results for input(s): DDIMER, FERRITIN, LDH, CRP in the last 72 hours.  Lab Results  Component Value Date   [place] NEGATIVE 11/13/2019   [place] NEGATIVE 05/23/2019     CBG: Recent Labs  Lab 11/18/19 0601 11/19/19 0642 11/20/19 0537 11/21/19 0635 11/22/19 0634  GLUCAP 107* 103* 100* 108* 118*    CBC: Recent Labs  Lab 11/18/19 0257 11/19/19 0315 11/20/19 0407 11/21/19 0405  WBC 16.0* 14.9* 15.1* 14.0*  HGB 12.7  12.1 12.1 12.8  HCT 39.7 38.2 38.3 40.4  MCV 85.0 84.5 84.7 86.5  PLT 413* 387 358 195    Basic Metabolic Panel: Recent Labs  Lab 11/18/19 0257 11/19/19 0315 11/20/19 0407 11/21/19 0405  NA 133* 135 135 134*  K 5.2* 4.4 4.5 4.8  CL 98 100 102 99  CO2 26 25 26 28   GLUCOSE 113* 128* 117* 125*  BUN 18 21* 20 18  CREATININE 0.90 0.89 0.78 0.89  CALCIUM 8.9 8.9 8.7* 8.6*     DVT prophylaxis: Lovenox  Code Status: Full code  Family Communication: No family at bedside    Status is: Inpatient  Dispo: The patient is from: Home              Anticipated d/c is to: CIR              Anticipated d/c date is: 11/25/2019              Patient currently medically stable for discharge  Barrier to discharge-awaiting bed at CIR.        Scheduled medications:   benzonatate  100 mg Oral TID   enoxaparin (LOVENOX) injection  40 mg Subcutaneous Daily   multivitamin with minerals  1 tablet Oral Daily   nystatin  5 mL Oral QID   pantoprazole  40 mg Oral TID AC   polyethylene glycol  17 g Oral BID   predniSONE  40 mg Oral BID WC   sodium chloride flush  3 mL Intravenous Once   sodium chloride flush  3 mL Intravenous Q12H    Consultants:  Neurology  Procedures:  None  Antibiotics:   Anti-infectives (From admission, onward)   None       Objective   Vitals:   11/23/19 2347 11/24/19 0339 11/24/19 0816 11/24/19 1356  BP: 131/86 133/83 138/87 125/76  Pulse: 87 86 84 91  Resp: 20 18 16 18   Temp: 98.1 F (36.7 C) 98.2 F (36.8 C) 97.9 F (36.6 C) 97.6 F (36.4 C)  TempSrc: Oral Oral Oral Oral  SpO2: 100% 99% 99% 99%  Weight:  75.8 kg    Height:        Intake/Output Summary (Last 24 hours) at 11/24/2019 1524 Last data filed at 11/24/2019 1357 Gross per 24 hour  Intake 660 ml  Output 2650 ml  Net -1990 ml    07/06 1901 - 07/08 0700 In: 180 [P.O.:180] Out: 3150 [Urine:3150]  Filed Weights   11/22/19 0017 11/23/19 0653 11/24/19 0339  Weight: 78.7  kg 78.8 kg 75.8 kg    Physical Examination:   General-appears in no acute distress Heart-S1-S2, regular, no murmur auscultated Lungs-clear to auscultation bilaterally, no wheezing or crackles auscultated Abdomen-soft, nontender, no organomegaly Extremities-no edema in the lower extremities Neuro-alert, oriented x3, no focal deficit noted      [place]   [facility] If 7PM-7AM, please contact night-coverage at [email or web address], Office  [phone]   11/24/2019, 3:24 PM  LOS: 11 days            [facility]  PROGRESS NOTE  Rhonda [name] MBW:[id number] DOB: 1960-12-05 DOA: 11/13/2019 PCP: Bartholome Bill, MD   Brief HPI:   59 year old female with history of hypertension, dyslipidemia, CVA in January 2021, COVID-19 in February 2021, presented to hospital with altered mental status.  At home patient had episode of vertigo, she fell and hit her head without loss of consciousness.  Patient return developed confusion and agitation.  Brain MRI showed changes suggestive of neurosarcoid.  Patient was started on IV steroid for 5-day protocol.  Currently waiting for CIR.    Subjective   Patient seen and examined, denies any complaints.  She is alert oriented x3.   Assessment/Plan:     7. Neurosarcoidosis-neurology recommends to continue prednisone 40 mg p.o. twice daily for 4 weeks prior to slow taper.  She will need taper of over the course of few months.  Awaiting to go to CIR for rehab.  She will need follow-up with neurology as outpatient.  Patient sees a neurologist at [facility].  CSF ACE level mildly elevated at 4.8, serum ACE is normal. 8. Hypertension-blood pressure is stable, currently on no medications. 9. Steroid-induced hyperglycemia-patient blood glucose is stable.  We will continue to monitor. 10. Chronic OSA-not on any therapy. 11. Elevated LFTs-CT scan unremarkable, hep C antibody negative.  HIV negative. 12. Oral  thrush-steroid-induced, started on nystatin.    SpO2: 99 %   COVID-19 Labs  No results for input(s): DDIMER, FERRITIN, LDH, CRP in the last 72 hours.  Lab Results  Component Value Date   [place] NEGATIVE 11/13/2019   [place] NEGATIVE 05/23/2019     CBG: Recent Labs  Lab 11/18/19 0601 11/19/19 0642 11/20/19 0537 11/21/19 0635 11/22/19 0634  GLUCAP 107* 103* 100* 108* 118*    CBC: Recent Labs  Lab 11/18/19 0257 11/19/19 0315 11/20/19 0407 11/21/19 0405  WBC 16.0* 14.9* 15.1* 14.0*  HGB 12.7 12.1 12.1 12.8  HCT 39.7 38.2 38.3 40.4  MCV 85.0 84.5 84.7 86.5  PLT 413* 387 358 948    Basic Metabolic Panel: Recent Labs  Lab 11/18/19 0257 11/19/19 0315 11/20/19 0407 11/21/19 0405  NA 133* 135 135 134*  K 5.2* 4.4 4.5 4.8  CL 98 100 102 99  CO2 26 25 26 28   GLUCOSE 113* 128* 117* 125*  BUN 18 21* 20 18  CREATININE 0.90 0.89 0.78 0.89  CALCIUM 8.9 8.9 8.7* 8.6*     DVT prophylaxis: Lovenox  Code Status: Full code  Family Communication: No family at bedside    Status is: Inpatient  Dispo: The patient is from: Home              Anticipated d/c is to: CIR              Anticipated d/c date is: 11/25/2019              Patient currently medically stable for discharge  Barrier to discharge-awaiting bed at CIR.        Scheduled medications:   benzonatate  100 mg Oral TID   enoxaparin (LOVENOX) injection  40 mg Subcutaneous Daily   multivitamin with minerals  1 tablet Oral Daily   nystatin  5 mL Oral QID   pantoprazole  40 mg Oral TID AC   polyethylene glycol  17 g Oral BID   predniSONE  40 mg Oral BID WC   sodium chloride flush  3 mL Intravenous Once   sodium chloride flush  3 mL Intravenous Q12H    Consultants:  Neurology  Procedures:  None  Antibiotics:   Anti-infectives (From admission, onward)   None       Objective   Vitals:   11/23/19 2347 11/24/19 0339 11/24/19 0816 11/24/19 1356  BP: 131/86 133/83  138/87 125/76  Pulse: 87 86 84 91  Resp: 20 18 16 18   Temp: 98.1 F (36.7 C) 98.2 F (36.8 C) 97.9 F (36.6 C) 97.6 F (36.4 C)  TempSrc: Oral Oral Oral Oral  SpO2: 100% 99% 99% 99%  Weight:  75.8 kg    Height:        Intake/Output Summary (Last 24 hours) at 11/24/2019 1524 Last data filed at 11/24/2019 1357 Gross per 24 hour  Intake 660 ml  Output 2650 ml  Net -1990 ml    07/06 1901 - 07/08 0700 In: 180 [P.O.:180] Out: 3150 [Urine:3150]  Filed Weights   11/22/19 0633 11/23/19 0653 11/24/19 0339  Weight: 78.7 kg 78.8 kg 75.8 kg    Physical Examination:    General: Appears in no acute distress  Cardiovascular: S1-S2, regular, no murmur auscultated  Respiratory: Clear to auscultation bilaterally, no wheezing or crackles auscultated  Abdomen: Abdomen is soft, nontender, no organomegaly  Extremities: No edema in the lower extremities  Neurologic: Alert, oriented x3, left 6th nerve palsy    Data Reviewed:   No results found for this or any previous visit (from the past 240 hour(s)).   Studies:  No results found.     Oswald Hillock   [facility] If 7PM-7AM, please contact night-coverage at [email or web address], Office  [phone]   11/24/2019, 3:24 PM  LOS: 11 days

## 2019-11-24 NOTE — Progress Notes (Signed)
Physical Therapy Treatment Patient Details Name: Rhonda [name] MRN: [id number] DOB: Mar 31, 1961 Today's Date: 11/24/2019    History of Present Illness Pt is a 59 year old woman admitted 11/13/19 with AMS and agitation s/p fall with bump to the head. 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 Working diagnosis of working diagnosis of acute demyelinating brain disease. PMH: HTN, prediabetes, CVA Jan 2021, COVID 06/2019, arthritis, vertigo.    PT Comments    Pt is making good progress towards therapy goals, and was able to demo good improvements in ambulation distance during the session today. She continues to require additional assist for safety and stability with transfers and is limited to 30-32ft in ambulation prior to requiring seated rest to recover. The pt will continue to benefit from skilled PT to further progress functional strength, stability, and independence with mobility prior to return home, and will do well with intensive therapies to return to her PLF and independence.     Follow Up Recommendations  CIR;Supervision for mobility/OOB;Supervision/Assistance - 24 hour     Equipment Recommendations  None recommended by PT    Recommendations for Other Services       Precautions / Restrictions Precautions Precautions: Fall Precaution Comments: pt with hx of vertigo, urinary/fecal incontinence Restrictions Weight Bearing Restrictions: No    Mobility  Bed Mobility Overal bed mobility: Needs Assistance Bed Mobility: Supine to Sit     Supine to sit: Min assist     General bed mobility comments: minA to bring BLE to EOB and raise trunk. VC for sequencing  Transfers Overall transfer level: Needs assistance Equipment used: Rolling walker (2 wheeled) Transfers: Sit to/from Stand Sit to Stand: Min assist;Min guard         General transfer comment: minA initially, progressed to minG with extra time. VC for hand  placement  Ambulation/Gait Ambulation/Gait assistance: Min guard Gait Distance (Feet): 45 Feet (+ 30 ft + 45 ft) Assistive device: Rolling walker (2 wheeled) Gait Pattern/deviations: Step-through pattern;Decreased stride length;Trunk flexed;Wide base of support Gait velocity: decreased Gait velocity interpretation: <1.31 ft/sec, indicative of household ambulator General Gait Details: seated rest x2, episode of urinary incontinence at end of session causing pt to return to room in chair rather than walking last 30 ft         Balance Overall balance assessment: Needs assistance Sitting-balance support: Feet supported Sitting balance-[name]: Fair Sitting balance - Comments: minG for safety, pt does not report dizziness today   Standing balance support: Bilateral upper extremity supported Standing balance-[name]: Poor Standing balance comment: Heavy reliance on BUE on RW.                             Cognition Arousal/Alertness: Awake/alert Behavior During Therapy: WFL for tasks assessed/performed Overall Cognitive Status: Within Functional Limits for tasks assessed Area of Impairment: Problem solving                             Problem Solving: Requires verbal cues;Slow processing General Comments: [facility] conversationally             Pertinent Vitals/Pain Pain Assessment: No/denies pain Pain Intervention(s): Monitored during session           PT Goals (current goals can now be found in the care plan section) Acute Rehab PT Goals Patient Stated Goal: To go to inpatient rehab PT Goal Formulation:  With patient Time For Goal Achievement: 11/28/19 Potential to Achieve Goals: Fair Progress towards PT goals: Progressing toward goals    Frequency    Min 3X/week      PT Plan Current plan remains appropriate       AM-PAC PT "6 Clicks" Mobility   Outcome Measure  Help needed turning from your back to your side while in a flat bed  without using bedrails?: None Help needed moving from lying on your back to sitting on the side of a flat bed without using bedrails?: A Little Help needed moving to and from a bed to a chair (including a wheelchair)?: A Little Help needed standing up from a chair using your arms (e.g., wheelchair or bedside chair)?: A Lot Help needed to walk in hospital room?: A Lot Help needed climbing 3-5 steps with a railing? : A Lot 6 Click Score: 16    End of Session Equipment Utilized During Treatment: Gait belt Activity Tolerance: Patient tolerated treatment well (urinary incontinence) Patient left: with call bell/phone within reach;in chair;with chair alarm set Nurse Communication: Mobility status PT Visit Diagnosis: Muscle weakness (generalized) (M62.81);Unsteadiness on feet (R26.81);Other abnormalities of gait and mobility (R26.89)     Time: 5625-6389 PT Time Calculation (min) (ACUTE ONLY): 44 min  Charges:  $Gait Training: 23-37 mins $Therapeutic Activity: 8-22 mins                     Karma Ganja, PT, DPT   Acute Rehabilitation Department Pager #: [phone]   Otho Bellows 11/24/2019, 6:55 PM

## 2019-11-25 MED ORDER — AMLODIPINE BESYLATE 5 MG PO TABS
5.0000 mg | ORAL_TABLET | Freq: Every day | ORAL | Status: DC
  Administered 2019-11-25 – 2019-11-28 (×4): 5 mg via ORAL
  Filled 2019-11-25 (×4): qty 1

## 2019-11-25 NOTE — TOC Progression Note (Addendum)
Transition of Care (TOC) - Progression Note  Marvetta Gibbons RN, BSN Transitions of Care Unit 4E- RN Case Manager See Treatment Team for direct phone #    Patient Details  Name: Rhonda [name] MRN: [id number] Date of Birth: Nov 28, 1960  Transition of Care [facility]) CM/SW Contact  Dahlia Client, Romeo Rabon, RN Phone Number: 11/25/2019, 11:55 AM  Clinical Narrative:    Received call from Pam at [facility]- pt has received insurance auth for INPT rehab program- however they do not have bed available for admission today- Pam will f/u on Monday regarding admission and bed availability. Per Pam she has spoken with pt's spouse and updated him on admission status.   Update- 1330- received another call from [facility]- confirming that they will have bed on Monday and will plan to admit pt on 7/12 to [facility]- Pam will f/u on Monday for transition timing- pt will need updated COVID test, d/c summary. Will request MD to order COVID test on Sunday in planning for Monday discharge. CM spoke with pt at bedside and updated on current transition plan to [facility]. Requested radiology CD of imaging to send with pt per request from [facility]. (please be sure CD is sent with pt on Monday)   Expected Discharge Plan: [place] Barriers to Discharge: Continued Medical Work up  Expected Discharge Plan and Services Expected Discharge Plan: [place]   Discharge Planning Services: CM Consult Post Acute Care Choice: IP Rehab Living arrangements for the past 2 months: Single Family Home                                       Social Determinants of Health (SDOH) Interventions    Readmission Risk Interventions No flowsheet data found.

## 2019-11-25 NOTE — Progress Notes (Signed)
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  At home patient had episode of vertigo, she fell and hit her head without loss of consciousness.  Patient return developed confusion and agitation.  Brain MRI showed changes suggestive of neurosarcoid.  Patient was started on IV steroid for 5-day protocol.  Currently waiting for CIR.    Subjective   Patient seen and examined, denies any complaints.  Waiting to go to CIR.   Assessment/Plan:     1. Neurosarcoidosis-neurology recommends to continue prednisone 40 mg p.o. twice daily for 4 weeks prior to slow taper.  She will need taper of over the course of few months.  Awaiting to go to CIR for rehab.  She will need follow-up with neurology as outpatient.  Patient sees a neurologist at [facility].  CSF ACE level mildly elevated at 4.8, serum ACE is normal. 2. Hypertension-blood pressure is mildly elevated, currently on no medications.  We will start her on amlodipine 5 mg daily. 3. Steroid-induced hyperglycemia-patient blood glucose is stable.  We will continue to monitor. 4. Chronic OSA-not on any therapy. 5. Elevated LFTs-CT scan unremarkable, hep C antibody negative.  HIV negative. 6. Oral thrush-steroid-induced, started on nystatin.    SpO2: 100 %   COVID-19 Labs  No results for input(s): DDIMER, FERRITIN, LDH, CRP in the last 72 hours.  Lab Results  Component Value Date   [place] NEGATIVE 11/13/2019   [place] NEGATIVE 05/23/2019     CBG: Recent Labs  Lab 11/19/19 0642 11/20/19 0537 11/21/19 0635 11/22/19 0634  GLUCAP 103* 100* 108* 118*    CBC: Recent Labs  Lab 11/19/19 0315 11/20/19 0407 11/21/19 0405  WBC 14.9* 15.1* 14.0*  HGB 12.1 12.1 12.8   HCT 38.2 38.3 40.4  MCV 84.5 84.7 86.5  PLT 387 358 371    Basic Metabolic Panel: Recent Labs  Lab 11/19/19 0315 11/20/19 0407 11/21/19 0405  NA 135 135 134*  K 4.4 4.5 4.8  CL 100 102 99  CO2 25 26 28   GLUCOSE 128* 117* 125*  BUN 21* 20 18  CREATININE 0.89 0.78 0.89  CALCIUM 8.9 8.7* 8.6*     DVT prophylaxis: Lovenox  Code Status: Full code  Family Communication: No family at bedside    Status is: Inpatient  Dispo: The patient is from: Home              Anticipated d/c is to: CIR              Anticipated d/c date is: 11/28/2019              Patient currently medically stable for discharge  Barrier to discharge-awaiting bed at CIR.        Scheduled medications:  . benzonatate  100 mg Oral TID  . enoxaparin (LOVENOX) injection  40 mg Subcutaneous Daily  . multivitamin with minerals  1 tablet Oral Daily  . nystatin  5 mL Oral QID  . pantoprazole  40 mg Oral TID AC  . polyethylene glycol  17 g Oral BID  . predniSONE  40 mg Oral BID WC  . sodium chloride flush  3 mL Intravenous Once  . sodium chloride flush  3 mL Intravenous Q12H    Consultants:  Neurology  Procedures:  None  Antibiotics:   Anti-infectives (From admission, onward)   None       Objective   Vitals:   11/24/19 2329 11/25/19 0342 11/25/19 0808 11/25/19 1150  BP: 127/85 129/85 (!) 151/93 135/90  Pulse: 82 82 83 82  Resp: 16 17    Temp: 98.4 F (36.9 C) (!) 97.4 F (36.3 C) 97.7 F (36.5 C) 97.9 F (36.6 C)  TempSrc: Oral Oral Axillary Oral  SpO2: 99% 98% 100% 100%  Weight:  76.9 kg    Height:        Intake/Output Summary (Last 24 hours) at 11/25/2019 1455 Last data filed at 11/25/2019 1200 Gross per 24 hour  Intake 360 ml  Output 1800 ml  Net -1440 ml    07/07 1901 - 07/09 0700 In: 600 [P.O.:600] Out: 3050 [Urine:3050]  Filed Weights   11/23/19 0653 11/24/19 0339 11/25/19 0342  Weight: 78.8 kg 75.8 kg 76.9 kg    Physical Examination:   General-appears  in no acute distress Heart-S1-S2, regular, no murmur auscultated Lungs-clear to auscultation bilaterally, no wheezing or crackles auscultated Abdomen-soft, nontender, no organomegaly Extremities-no edema in the lower extremities Neuro-alert, oriented x3, no focal deficit noted      [place]   [facility] If 7PM-7AM, please contact night-coverage at [email or web address], Office  [phone]   11/25/2019, 2:55 PM  LOS: 12 days

## 2019-11-25 NOTE — Progress Notes (Signed)
Occupational Therapy Treatment Patient Details Name: Rhonda [name] MRN: [id number] DOB: Jun 13, 1960 Today's Date: 11/25/2019    History of present illness Pt is a 59 year old woman admitted 11/13/19 with AMS and agitation s/p fall with bump to the head. 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 Working diagnosis of working diagnosis of acute demyelinating brain disease. PMH: HTN, prediabetes, CVA Jan 2021, COVID 06/2019, arthritis, vertigo.   OT comments  Session with focus on self-care re-education, functional transfers, and mobility with RW this date as detailed below. Patient ambulated ~1ft to walk-in shower requiring Min A for lateral steps to [facility] in prep for bathing. Patient completed UB bathing/dressing with Min A and LB bathing/dressing with Mod A in sitting/standing. Patient ambulated an additional ~94ft to recliner positioned near bathroom door 2/2 fatigue. Patient continues to be limited by decreased activity tolerance, decreased strength, decreased static/dynamic standing balance and increased need for assistance from others. Patient would benefit from continued acute OT services in prep for return to PLOF. Continued recommendation for inpatient rehab to maximize safety and independence with self-care tasks and decrease caregiver burden.    Follow Up Recommendations  CIR    Equipment Recommendations  None recommended by OT    Recommendations for Other Services Rehab consult    Precautions / Restrictions Precautions Precautions: Fall Precaution Comments: pt with hx of vertigo, urinary/fecal incontinence Restrictions Weight Bearing Restrictions: No       Mobility Bed Mobility Overal bed mobility: Needs Assistance Bed Mobility: Supine to Sit     Supine to sit: Min guard     General bed mobility comments: Increased time for advancement of BLE toward EOB with use of UE.   Transfers Overall transfer level: Needs assistance Equipment used: Rolling  walker (2 wheeled) Transfers: Sit to/from Stand Sit to Stand: Min assist;Min guard         General transfer comment: Min A from low surfaces and Min guard from elevated surfaces.     Balance Overall balance assessment: Needs assistance Sitting-balance support: Feet supported Sitting balance-[name]: Fair     Standing balance support: Bilateral upper extremity supported Standing balance-[name]: Poor Standing balance comment: Heavy reliance on grab bars to bathe buttocks in standing.                            ADL either performed or assessed with clinical judgement   ADL           Upper Body Bathing: Minimal assistance;Sitting Upper Body Bathing Details (indicate cue type and reason): Shower level seated on BSC Lower Body Bathing: Moderate assistance Lower Body Bathing Details (indicate cue type and reason): Shower level sitting/standing with use of BSC. Assist to bathe bilateral feet and buttocks in standing with use of grab bars.  Upper Body Dressing : Minimal assistance;Sitting Upper Body Dressing Details (indicate cue type and reason): To don anterior hospital gown Lower Body Dressing: Moderate assistance Lower Body Dressing Details (indicate cue type and reason): To don footwear seated on BSC.          Tub/ Shower Transfer: Minimal assistance;3 in 1;Grab Engineer, agricultural Details (indicate cue type and reason): Min A for lateral steps to walk-in shower with cues for sequencing and safety.   General ADL Comments: Functional mobility from EOB to [facility] in shower with Min A and cues for walker management. Patient completing bathing in walk-in shower seated on BSC.  Vision       Perception     Praxis      Cognition Arousal/Alertness: Awake/alert Behavior During Therapy: WFL for tasks assessed/performed;Flat affect Overall Cognitive Status: Within Functional Limits for tasks assessed Area of Impairment: Problem  solving                     Memory: Decreased short-term memory       Problem Solving: Requires verbal cues;Slow processing          Exercises     Shoulder Instructions       General Comments      Pertinent Vitals/ Pain       Pain Assessment: No/denies pain  Home Living                                          Prior Functioning/Environment              Frequency  Min 2X/week        Progress Toward Goals  OT Goals(current goals can now be found in the care plan section)  Progress towards OT goals: Progressing toward goals  Acute Rehab OT Goals Patient Stated Goal: To go to inpatient rehab.  OT Goal Formulation: With patient Time For Goal Achievement: 11/28/19 Potential to Achieve Goals: Good ADL Goals Pt Will Perform Upper Body Bathing: with supervision;sitting Pt Will Perform Lower Body Bathing: sitting/lateral leans;with min assist;with adaptive equipment Pt Will Perform Upper Body Dressing: with supervision;sitting Pt Will Perform Lower Body Dressing: with min assist;sit to/from stand;with adaptive equipment Pt Will Transfer to Toilet: with supervision;stand pivot transfer;bedside commode Pt Will Perform Toileting - Clothing Manipulation and hygiene: with supervision;sitting/lateral leans;sit to/from stand Additional ADL Goal #1: Pt will perform bed mobility with min assist in preparation for ADL.  Plan Discharge plan remains appropriate    Co-evaluation                 AM-PAC OT "6 Clicks" Daily Activity     Outcome Measure   Help from another person eating meals?: None Help from another person taking care of personal grooming?: A Little Help from another person toileting, which includes using toliet, bedpan, or urinal?: A Lot Help from another person bathing (including washing, rinsing, drying)?: A Lot Help from another person to put on and taking off regular upper body clothing?: A Little Help from another  person to put on and taking off regular lower body clothing?: A Lot 6 Click Score: 16    End of Session Equipment Utilized During Treatment: Gait belt;Rolling walker  OT Visit Diagnosis: Unsteadiness on feet (R26.81);Other abnormalities of gait and mobility (R26.89);Dizziness and giddiness (R42);Muscle weakness (generalized) (M62.81);Other symptoms and signs involving cognitive function   Activity Tolerance Patient tolerated treatment well   Patient Left in chair;with call bell/phone within reach;with chair alarm set   Nurse Communication          Time: 1200-1230 OT Time Calculation (min): 30 min  Charges: OT General Charges $OT Visit: 1 Visit OT Treatments $Self Care/Home Management : 23-37 mins  Fianna Snowball H. OTR/L Supplemental OT, Department of rehab services [phone]   Jarryd Gratz R H. 11/25/2019, 1:07 PM

## 2019-11-26 NOTE — Progress Notes (Signed)
[facility]  PROGRESS NOTE  Rhonda [name] [id number]:[id number] DOB: [birth year] DOA: 11/13/2019 PCP: Bartholome Bill, MD   Brief HPI:   59 year old female with history of hypertension, dyslipidemia, lacunar CVA in January 2021, COVID-19 in February 2021, presented to hospital with altered mental status.  At home patient had episode of vertigo, she fell and hit her head without loss of consciousness.  Patient return developed confusion and agitation.  Brain MRI showed changes suggestive of neurosarcoid.  Patient was started on IV steroid for 5-day protocol.  Currently waiting for CIR.    Subjective   Patient seen, denies chest pain or shortness of breath.  Waiting to go to CIR at [facility].   Assessment/Plan:     1. Neurosarcoidosis-neurology recommends to continue prednisone 40 mg p.o. twice daily for 4 weeks prior to slow taper.  She will need taper off over the course of few months.  Awaiting to go to CIR for rehab.  She will need follow-up with neurology as outpatient.  Patient sees a neurologist at [facility].  CSF ACE level mildly elevated at 4.8, serum ACE is normal. 2. Hypertension-blood pressure is mildly elevated, started on low-dose amlodipine 5 mg p.o. daily.  Blood pressure is well controlled.  3. Steroid-induced hyperglycemia-patient blood glucose is stable.  We will continue to monitor. 4. Chronic OSA-not on any therapy. 5. Elevated LFTs-CT scan unremarkable, hep C antibody negative.  HIV negative. 6. Oral thrush-steroid-induced, started on nystatin.    SpO2: 100 %   COVID-19 Labs  No results for input(s): DDIMER, FERRITIN, LDH, CRP in the last 72 hours.  Lab Results  Component Value Date   [place] NEGATIVE 11/13/2019   [place] NEGATIVE 05/23/2019     CBG: Recent Labs  Lab 11/20/19 0537 11/21/19 0635 11/22/19 0634  GLUCAP 100* 108* 118*    CBC: Recent Labs  Lab 11/20/19 0407 11/21/19 0405  WBC 15.1* 14.0*  HGB 12.1 12.8  HCT 38.3 40.4   MCV 84.7 86.5  PLT 358 702    Basic Metabolic Panel: Recent Labs  Lab 11/20/19 0407 11/21/19 0405  NA 135 134*  K 4.5 4.8  CL 102 99  CO2 26 28  GLUCOSE 117* 125*  BUN 20 18  CREATININE 0.78 0.89  CALCIUM 8.7* 8.6*     DVT prophylaxis: Lovenox  Code Status: Full code  Family Communication: No family at bedside    Status is: Inpatient  Dispo: The patient is from: Home              Anticipated d/c is to: CIR              Anticipated d/c date is: 11/28/2019              Patient currently medically stable for discharge  Barrier to discharge-awaiting bed at CIR.        Scheduled medications:  . amLODipine  5 mg Oral Daily  . benzonatate  100 mg Oral TID  . enoxaparin (LOVENOX) injection  40 mg Subcutaneous Daily  . multivitamin with minerals  1 tablet Oral Daily  . nystatin  5 mL Oral QID  . pantoprazole  40 mg Oral TID AC  . polyethylene glycol  17 g Oral BID  . predniSONE  40 mg Oral BID WC  . sodium chloride flush  3 mL Intravenous Once  . sodium chloride flush  3 mL Intravenous Q12H    Consultants:  Neurology  Procedures:  None  Antibiotics:  Anti-infectives (From admission, onward)   None       Objective   Vitals:   11/25/19 2000 11/26/19 0001 11/26/19 0321 11/26/19 1051  BP: 129/76 127/87 113/75 (!) 131/95  Pulse:  69 82 85  Resp:  17 19 18   Temp: 98.2 F (36.8 C) 98.2 F (36.8 C) 98.3 F (36.8 C) 98.7 F (37.1 C)  TempSrc: Oral Oral Oral Oral  SpO2:  98% 99% 100%  Weight:   76.7 kg   Height:        Intake/Output Summary (Last 24 hours) at 11/26/2019 1056 Last data filed at 11/25/2019 1200 Gross per 24 hour  Intake --  Output 550 ml  Net -550 ml    07/08 1901 - 07/10 0700 In: 360 [P.O.:360] Out: 1500 [Urine:1500]  Filed Weights   11/24/19 0339 11/25/19 0342 11/26/19 0321  Weight: 75.8 kg 76.9 kg 76.7 kg    Physical Examination:  General-appears in no acute distress Heart-S1-S2, regular, no murmur  auscultated Lungs-clear to auscultation bilaterally, no wheezing or crackles auscultated Abdomen-soft, nontender, no organomegaly Extremities-no edema in the lower extremities Neuro-alert, oriented x3, no focal deficit noted    [place]   [facility] If 7PM-7AM, please contact night-coverage at [email or web address], Office  [phone]   11/26/2019, 10:56 AM  LOS: 13 days

## 2019-11-26 NOTE — Progress Notes (Signed)
Called report to [address] [facility] and transferred the pt.

## 2019-11-27 LAB — GLUCOSE, CAPILLARY: Glucose-Capillary: 139 mg/dL — ABNORMAL HIGH (ref 70–99)

## 2019-11-27 LAB — SARS CORONAVIRUS 2 (TAT 6-24 HRS): SARS Coronavirus 2: NEGATIVE

## 2019-11-27 NOTE — Progress Notes (Signed)
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  At home patient had episode of vertigo, she fell and hit her head without loss of consciousness.  Patient return developed confusion and agitation.  Brain MRI showed changes suggestive of neurosarcoid.  Patient was started on IV steroid for 5-day protocol.  Currently waiting for CIR.    Subjective   Patient seen and examined, awaiting to go to CIR at [facility] on 11/28/2019.   Assessment/Plan:     1. Neurosarcoidosis-neurology recommends to continue prednisone 40 mg p.o. twice daily for 4 weeks prior to slow taper.  She will need taper off over the course of few months.  Awaiting to go to CIR for rehab.  She will need follow-up with neurology as outpatient.  Patient sees a neurologist at [facility].  CSF ACE level mildly elevated at 4.8, serum ACE is normal. 2. Hypertension-blood pressure is mildly elevated, started on low-dose amlodipine 5 mg p.o. daily.  Blood pressure is well controlled.  3. Steroid-induced hyperglycemia-patient blood glucose is stable.  We will continue to monitor. 4. Chronic OSA-not on any therapy. 5. Elevated LFTs-CT scan unremarkable, hep C antibody negative.  HIV negative. 6. Oral thrush-steroid-induced, started on nystatin.    SpO2: 100 %   COVID-19 Labs  No results for input(s): DDIMER, FERRITIN, LDH, CRP in the last 72 hours.  Lab Results  Component Value Date   [place]ARSCOV2NAA NEGATIVE 11/13/2019   [place] NEGATIVE 05/23/2019     CBG: Recent Labs  Lab 11/21/19 0635 11/22/19 0634 11/27/19 0831  GLUCAP 108* 118* 139*    CBC: Recent Labs  Lab 11/21/19 0405  WBC 14.0*  HGB 12.8  HCT 40.4  MCV 86.5  PLT 244    Basic Metabolic  Panel: Recent Labs  Lab 11/21/19 0405  NA 134*  K 4.8  CL 99  CO2 28  GLUCOSE 125*  BUN 18  CREATININE 0.89  CALCIUM 8.6*     DVT prophylaxis: Lovenox  Code Status: Full code  Family Communication: No family at bedside    Status is: Inpatient  Dispo: The patient is from: Home              Anticipated d/c is to: CIR              Anticipated d/c date is: 11/28/2019              Patient currently medically stable for discharge  Barrier to discharge-awaiting bed at CIR.        Scheduled medications:  . amLODipine  5 mg Oral Daily  . benzonatate  100 mg Oral TID  . enoxaparin (LOVENOX) injection  40 mg Subcutaneous Daily  . multivitamin with minerals  1 tablet Oral Daily  . nystatin  5 mL Oral QID  . pantoprazole  40 mg Oral TID AC  . polyethylene glycol  17 g Oral BID  . predniSONE  40 mg Oral BID WC  . sodium chloride flush  3 mL Intravenous Once  . sodium chloride flush  3 mL Intravenous Q12H    Consultants:  Neurology  Procedures:  None  Antibiotics:   Anti-infectives (From admission, onward)   None       Objective   Vitals:   11/26/19 1051  11/26/19 1636 11/27/19 0052 11/27/19 0741  BP: (!) 131/95 134/88 (!) 148/89 138/87  Pulse: 85 90 84 87  Resp: 18 18 14 16   Temp: 98.7 F (37.1 C) 97.9 F (36.6 C) 99 F (37.2 C) 98 F (36.7 C)  TempSrc: Oral  Oral   SpO2: 100% 100% 99% 100%  Weight:      Height:        Intake/Output Summary (Last 24 hours) at 11/27/2019 1356 Last data filed at 11/27/2019 1305 Gross per 24 hour  Intake --  Output 1600 ml  Net -1600 ml    07/09 1901 - 07/11 0700 In: -  Out: 950 [Urine:950]  Filed Weights   11/24/19 0339 11/25/19 0342 11/26/19 0321  Weight: 75.8 kg 76.9 kg 76.7 kg    Physical Examination:  General-appears in no acute distress Heart-S1-S2, regular, no murmur auscultated Lungs-clear to auscultation bilaterally, no wheezing or crackles auscultated Abdomen-soft, nontender, no  organomegaly Extremities-no edema in the lower extremities Neuro-alert, oriented x3, no focal deficit noted    [place]   [facility] If 7PM-7AM, please contact night-coverage at [email or web address], Office  [phone]   11/27/2019, 1:56 PM  LOS: 14 days

## 2019-11-28 LAB — GLUCOSE, CAPILLARY: Glucose-Capillary: 140 mg/dL — ABNORMAL HIGH (ref 70–99)

## 2019-11-28 MED ORDER — PREDNISONE 20 MG PO TABS: 40.0000 mg | ORAL_TABLET | Freq: Two times a day (BID) | ORAL | 0 refills | Status: AC

## 2019-11-28 MED ORDER — PANTOPRAZOLE SODIUM 40 MG PO TBEC: 40.0000 mg | DELAYED_RELEASE_TABLET | Freq: Every day | ORAL | Status: DC

## 2019-11-28 MED ORDER — AMLODIPINE BESYLATE 5 MG PO TABS: 5.0000 mg | ORAL_TABLET | Freq: Every day | ORAL | Status: DC

## 2019-11-28 MED ORDER — LISINOPRIL 10 MG PO TABS: 10.0000 mg | ORAL_TABLET | Freq: Every day | ORAL | 11 refills | Status: DC

## 2019-11-28 NOTE — Progress Notes (Signed)
PT Cancellation Note  Patient Details Name: Rhonda [name] MRN: [id number] DOB: 04/05/1961   Cancelled Treatment:    Reason Eval/Treat Not Completed: Other (comment) patient with [facility] preparing to leave [place], not available to work with [place], DPT, PN1   Supplemental Physical Therapist [place]    Pager [phone] Acute Rehab Office [phone]

## 2019-11-28 NOTE — Progress Notes (Signed)
Inpatient Rehabilitation-Admissions Coordinator   Per chart review, it appears this patient is being DC'd to [facility] CIR today. AC will no longer follow for back up and will sign off.   Raechel Ache, OTR/L  Rehab Admissions Coordinator  [phone] 11/28/2019 11:51 AM

## 2019-11-28 NOTE — Telephone Encounter (Signed)
Patient still in the hospital -pr

## 2019-11-28 NOTE — Discharge Summary (Signed)
Physician Discharge Summary  Rhonda [name] ZOX:[id number] DOB: 06-Jan-1961 DOA: 11/13/2019  PCP: Bartholome Bill, MD  Admit date: 11/13/2019 Discharge date: 11/28/2019  Time spent: 50* minutes  Recommendations for Outpatient Follow-up:  1. Patient to be discharged to CIR at [facility] 2. Continue prednisone 40 mg p.o. twice daily till December 18, 2019.  Then she will need a slow taper over the next few months.  Patient will need outpatient neurology appointment for  recommendations regarding tapering of prednisone.   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  At home patient had episode of vertigo, she fell and hit her head without loss of consciousness.  Patient return developed confusion and agitation.  Brain MRI showed changes suggestive of neurosarcoid.  Patient was started on IV steroid for 5-day protocol.    Hospital Course:  1. Neurosarcoidosis-neurology recommends to continue prednisone 40 mg p.o. twice daily for 4 weeks prior to slow taper.  She will need taper off over the course of few months.  Awaiting to go to CIR for rehab.  She will need follow-up with neurology as outpatient.  Patient sees a neurologist at [facility].  CSF ACE level mildly elevated at 4.8, serum ACE is normal. 2. Hypertension-blood pressure is mildly elevated, started on low-dose amlodipine 5 mg p.o. daily.  Blood pressure is still mild elevated.  Will add lisinopril 10 mg  p.o. daily. 3. Steroid-induced hyperglycemia-patient blood glucose is stable.  We will continue to monitor. 4. Chronic OSA-not on any therapy. 5. Elevated LFTs-CT scan unremarkable, hep C antibody negative.  HIV negative. 6. Oral thrush-steroid-induced, resolved after starting on nystatin.  Procedures:    Consultations:  Neurology  Discharge Exam: Vitals:   11/27/19 2015 11/28/19 1011  BP: (!) 149/99 127/82  Pulse: 93 92  Resp: 20 13  Temp: 98.3 F (36.8 C) (!) 97.5 F (36.4 C)  SpO2: 99% 100%    General: Appears in no acute distress Cardiovascular: S1-S2, regular Respiratory: Clear to auscultation bilaterally  Discharge Instructions   Discharge Instructions    Diet - low sodium heart healthy   Complete by: As directed    Increase activity slowly   Complete by: As directed      Allergies as of 11/28/2019      Reactions   Aspirin Nausea And Vomiting   Augmentin [amoxicillin-pot Clavulanate] Swelling, Other (See Comments)   Hand swelled at IV site      Medication List    STOP taking these medications   clopidogrel 75 MG tablet Commonly known as: PLAVIX   gabapentin 300 MG capsule Commonly known as: NEURONTIN   lisinopril-hydrochlorothiazide 10-12.5 MG tablet Commonly known as: ZESTORETIC   meclizine 25 MG tablet Commonly known as: ANTIVERT     TAKE these medications   acetaminophen 650 MG CR tablet Commonly known as: TYLENOL Take 1,300 mg by mouth 2 (two) times daily as needed (arthritis pain).   amLODipine 5 MG tablet Commonly known as: NORVASC Take  1 tablet (5 mg total) by mouth daily.   APPLE CIDER VINEGAR PO Take 2 capsules by mouth at bedtime.   aspirin EC 81 MG tablet Take 1 tablet (81 mg total) by mouth daily.   atorvastatin 80 MG tablet Commonly known as: LIPITOR Take 1 tablet (80 mg total) by mouth daily at 6 PM. What changed: when to take this   Fish Oil 1000 MG Caps Take 1,000 mg by mouth at bedtime.    glucosamine-chondroitin 500-400 MG tablet Take 1 tablet by mouth 3 (three) times daily. What changed:   how much to take  when to take this   lisinopril 10 MG tablet Commonly known as: ZESTRIL Take 1 tablet (10 mg total) by mouth daily.   multivitamin with minerals Tabs tablet Take 1 tablet by mouth daily.   pantoprazole 40 MG tablet Commonly known as: PROTONIX Take 1 tablet (40 mg total) by mouth daily. What changed: when to take this   predniSONE 20 MG tablet Commonly known as: DELTASONE Take 2 tablets (40 mg total) by mouth 2 (two) times daily with a meal for 21 days. Then slow taper over next few months. Will need instructions form outpatient  neurology to taper it  off.   PRESCRIPTION MEDICATION Inhale into the lungs See admin instructions. CPAP- At bedtime   VITAMIN B-12 PO Take 1 tablet by mouth daily.      Allergies  Allergen Reactions  . Aspirin Nausea And Vomiting  . Augmentin [Amoxicillin-Pot Clavulanate] Swelling and Other (See Comments)    Hand swelled at IV site      The results of significant diagnostics from this hospitalization (including imaging, microbiology, ancillary and laboratory) are listed below for reference.    Significant Diagnostic Studies: DG Chest 2 View  Result Date: 11/13/2019 CLINICAL DATA:  [place] this morning, altered level of consciousness EXAM: CHEST - 2 VIEW COMPARISON:  05/23/2019 FINDINGS: The heart size and mediastinal contours are within normal limits. Both lungs are clear. The visualized skeletal structures are unremarkable. IMPRESSION: No active cardiopulmonary disease. Electronically Signed   By: Randa Ngo M.D.   On: 11/13/2019 22:36   CT CHEST W CONTRAST  Result Date: 11/19/2019 CLINICAL DATA:  Concern for sarcoidosis. EXAM: CT CHEST, ABDOMEN, AND PELVIS WITH CONTRAST TECHNIQUE: Multidetector CT imaging of the chest, abdomen and pelvis was performed following the standard protocol during bolus administration of  intravenous contrast. CONTRAST:  133mL OMNIPAQUE IOHEXOL 300 MG/ML  SOLN COMPARISON:  CT abdomen/pelvis 07/07/2018, CT chest 08/10/2018 and 11/02/2019 FINDINGS: CT CHEST FINDINGS Cardiovascular: Heart is normal size. Thoracic aorta is normal in caliber without dissection or aneurysm. Pulmonary arterial system is unremarkable. Vascular structures are otherwise unremarkable. Mediastinum/Nodes: There are several shoddy mediastinal lymph nodes none greater than a cm in size and slightly improved overall compared to the prior exam is likely reactive. No hilar adenopathy. Previously seen right infrahilar adenopathy resolved. Remaining mediastinal structures are within normal. No axillary adenopathy. Lungs/Pleura: Lungs are adequately inflated demonstrate stable 1-2 mm nodule over the periphery of the lateral right upper lobe. Minimal linear scarring/atelectasis right base. Left lung is clear. Airways are normal. Musculoskeletal: No focal abnormality. CT ABDOMEN PELVIS FINDINGS Hepatobiliary: Liver, gallbladder and biliary tree are normal. Pancreas: Normal. Spleen: Normal. Adrenals/Urinary Tract: Adrenal glands are normal. Kidneys are normal in size without hydronephrosis or nephrolithiasis. Subcentimeter hypodensity over the mid to lower pole left kidney unchanged likely a cyst. Ureters and bladder are normal. Stomach/Bowel: Stomach is normal. Few mildly prominent jejunal loops  over the left upper quadrant measuring up to 3 cm in diameter. There is a relative narrowing of 1 of the jejunal loops with slight twisting of the mesentery. No inflammatory change or free fluid. Appendix is normal. Diverticulosis of the colon without active inflammation. Vascular/Lymphatic: Mild calcified plaque over the abdominal aorta which is normal in caliber. No adenopathy. Reproductive: Previous hysterectomy.  Adnexal regions unremarkable. Other: No significant free fluid or focal inflammatory change. Musculoskeletal: Mild spondylosis of the  spine. Disc disease at the L4-5 and L5-S1 levels with grade 1 anterolisthesis of L4 on L5 unchanged. IMPRESSION: 1. Several shoddy mediastinal lymph nodes. No evidence of mediastinal, hilar or axillary adenopathy. Previously seen right infrahilar adenopathy has resolved. No acute findings in the chest. 2. A few mildly prominent jejunal loops in the left upper quadrant with relative focal narrowing with associated twist of the mesentery. No adjacent free fluid or inflammatory change. Internal hernia could produce this appearance. Recommend attention on follow-up and consider follow-up abdominal radiographs to confirm no developing obstruction. 3.  Colonic diverticulosis. 4. Subcentimeter left renal cortical hypodensity too small to characterize but likely a cyst and unchanged. 5.  Aortic Atherosclerosis (ICD10-I70.0). 6. Stable grade 1 anterolisthesis of L4 on L5. Significant disc disease at the L4-5 and L5-S1 levels. Electronically Signed   By: Marin Olp M.D.   On: 11/19/2019 15:21   CT CERVICAL SPINE WO CONTRAST  Result Date: 11/13/2019 CLINICAL DATA:  Fall EXAM: CT CERVICAL SPINE WITHOUT CONTRAST TECHNIQUE: Multidetector CT imaging of the cervical spine was performed without intravenous contrast. Multiplanar CT image reconstructions were also generated. COMPARISON:  None. FINDINGS: Alignment: Mild reversal the normal cervical lordosis, likely positional. No spondylolisthesis. Skull base and vertebrae: No acute fracture. No primary bone lesion or focal pathologic process. Soft tissues and spinal canal: No prevertebral fluid or swelling. No visible canal hematoma. Disc levels: Moderate loss of disc height from C3-C4 through C6-C7. Endplate spurring and mild spondylotic disc bulging at these levels. No convincing disc herniation. Upper chest: No acute findings.  Clear lung apices. Other: None. IMPRESSION: 1. No fracture or acute finding. Electronically Signed   By: Lajean Manes M.D.   On: 11/13/2019 16:46    MR BRAIN WO CONTRAST  Result Date: 11/13/2019 CLINICAL DATA:  Left-sided weakness EXAM: MRI HEAD WITHOUT CONTRAST TECHNIQUE: Multiplanar, multiecho pulse sequences of the brain and surrounding structures were obtained without intravenous contrast. COMPARISON:  08/04/2019 FINDINGS: Brain: No acute infarct, acute hemorrhage or extra-axial collection. There is hyperintense T2-weighted signal within the pons and medulla oblongata, new since the prior study. Supratentorial white matter disease is also markedly worsened. No associated diffusion restriction. 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 Normal volume of CSF spaces. No chronic microhemorrhage. There is thickening of the pituitary infundibulum. Vascular: Normal flow voids. Skull and upper cervical spine: There is hyperintense T2-weighted signal visible within the upper spinal cord. Sinuses/Orbits: Negative. Other: None. IMPRESSION: 1. New white matter disease in the cerebrum, brainstem and visualized upper cervical spinal cord, thickening of the pituitary stalk. The primary differential considerations are neurosarcoidosis and acute demyelinating disease. 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 Electronically Signed   By: Ulyses Jarred M.D.   On: 11/13/2019 19:37   MR BRAIN W CONTRAST  Result Date: 11/14/2019 CLINICAL DATA:  Acute encephalopathy EXAM: MRI HEAD WITH CONTRAST TECHNIQUE: Multiplanar, multiecho pulse sequences of the  brain and surrounding structures were obtained with intravenous contrast. CONTRAST:  9.23mL GADAVIST GADOBUTROL 1 MMOL/ML IV SOLN COMPARISON:  None. FINDINGS: There is a large amount of leptomeningeal contrast enhancement within the posterior fossa and along the brainstem. Supratentorial leptomeningeal contrast enhancement is greatest along the inter hemispheric fissure and near the  circle-of-Willis. The infundibulum is markedly thickened. Enhancement extends along the cisternal segments of cranial nerves 5, 7 and 8. The smaller lower cranial nerves are difficult to visualize on this study. IMPRESSION: Extensive leptomeningeal contrast enhancement favoring the posterior fossa and brainstem with thickening of the infundibulum. This is most typical of neurosarcoid. Other forms of inflammatory/infectious leptomeningitis remain possible, but are less likely. Electronically Signed   By: Ulyses Jarred M.D.   On: 11/14/2019 00:53   MR BRAIN W WO CONTRAST  Result Date: 11/19/2019 CLINICAL DATA:  Follow-up examination post air oils to evaluate for effectiveness of treatment; sarcoidosis. EXAM: MRI HEAD WITHOUT AND WITH CONTRAST TECHNIQUE: Multiplanar, multiecho pulse sequences of the brain and surrounding structures were obtained without and with intravenous contrast. CONTRAST:  7.38mL GADAVIST GADOBUTROL 1 MMOL/ML IV SOLN COMPARISON:  Contrast-enhanced brain MRI 11/14/2019, noncontrast brain MRI 11/13/2019. FINDINGS: Brain: Again demonstrated is relatively extensive curvilinear and nodular leptomeningeal enhancement within the supratentorial and infratentorial compartments. Although improved, supratentorial leptomeningeal enhancement is again most prominent within the anterior interhemispheric fissure. There is prominent, although improved leptomeningeal enhancement, within the posterior fossa. Persistent although improved abnormal leptomeningeal enhancement along the third, fifth, seventh and eighth cranial nerves bilaterally. Persistent thickening and abnormal enhancement of the pituitary stalk, subtly improved. Redemonstrated chronic lacunar infarct within the paramedian left pons. Patchy T2/FLAIR hyperintense signal abnormality within the cerebral white matter appears unchanged. There has been some improvement in patchy T2/FLAIR hyperintense signal abnormality involving the brainstem. There is no  acute infarct. No chronic intracranial blood products. No extra-axial fluid collection. No midline shift. Vascular: Expected proximal arterial flow voids. Skull and upper cervical spine: No focal suspicious marrow lesion. Sinuses/Orbits: Visualized orbits show no acute finding. Mild ethmoid sinus mucosal thickening. Large right mastoid effusion. IMPRESSION: Relatively extensive, although improved, supratentorial and infratentorial curvilinear and nodular leptomeningeal enhancement as detailed. This includes persistent although improved leptomeningeal enhancement along multiple cranial nerves. Findings may reflect sequela of sarcoidosis with interval steroid response. Other infectious/inflammatory leptomeningeal processes cannot be excluded. There is persistent although improved patchy signal abnormality within the brainstem. Unchanged multifocal T2 hyperintense signal changes within the supratentorial white matter. Redemonstrated chronic lacunar infarct within the paramedian left pons. Mild ethmoid sinus mucosal thickening. Large right mastoid effusion. Electronically Signed   By: [name] DO   On: 11/19/2019 18:56   MR CERVICAL SPINE W WO CONTRAST  Result Date: 11/19/2019 CLINICAL DATA:  Posterior right treatment effectiveness, sarcoidosis. EXAM: MRI CERVICAL SPINE WITHOUT AND WITH CONTRAST TECHNIQUE: Multiplanar and multiecho pulse sequences of the cervical spine, to include the craniocervical junction and cervicothoracic junction, were obtained without and with intravenous contrast. CONTRAST:  7.64mL GADAVIST GADOBUTROL 1 MMOL/ML IV SOLN COMPARISON:  Cervical spine MRI 11/06/2019 FINDINGS: Alignment: Reversal of the expected cervical lordosis Vertebrae: Heterogeneous marrow signal without focal suspicious osseous lesion. Cord: There is persistent T2 hyperintense signal abnormality within the cervicomedullary junction. Persistent although significantly improved patchy T2 hyperintense signal abnormality  within the cervical spinal cord as compared to the prior examination of 11/13/2019, there is persistent although improved fairly extensive multifocal linear and nodular leptomeningeal enhancement along the cervical and visualized thoracic spinal cord. Posterior Fossa, vertebral arteries, paraspinal  tissues: Posterior fossa better assessed on concurrently performed brain MRI. Flow voids preserved within the imaged cervical vertebral arteries. Paraspinal soft tissues unremarkable. Disc levels: Cervical spondylosis is unchanged and previously detailed on recent prior MRI 11/14/2019 IMPRESSION: Persistent patchy T2 hyperintense signal abnormality within the cervicomedullary junction. Persistent, although significantly improved, fairly extensive multifocal linear and nodular leptomeningeal enhancement along the cervical and visualized thoracic spinal cord. Findings may reflect sequela of sarcoidosis with interval steroid response. Other infectious/inflammatory leptomeningeal processes cannot be excluded. Electronically Signed   By: [name] DO   On: 11/19/2019 19:02   MR Cervical Spine W or Wo Contrast  Result Date: 11/14/2019 CLINICAL DATA:  Encephalopathy and weakness. EXAM: MRI CERVICAL SPINE WITHOUT AND WITH CONTRAST MRI THORACIC SPINE WITHOUT AND WITH CONTRAST TECHNIQUE: Multiplanar and multiecho pulse sequences of the cervical spine, to include the craniocervical junction and cervicothoracic junction, were obtained without and with intravenous contrast. CONTRAST:  9.24mL GADAVIST GADOBUTROL 1 MMOL/ML IV SOLN COMPARISON:  None. FINDINGS: CERVICAL SPINE FINDINGS Alignment: Physiologic. Vertebrae: Heterogeneous bone marrow signal without focal lesion. Cord: There is hyperintense T2-weighted signal within the cord at the cervicomedullary junction and C2 level. There is leptomeningeal contrast enhancement extending the length of the cervical spine, greatest at the cervicomedullary junction and the C2 and C3  levels. Posterior Fossa, vertebral arteries, paraspinal tissues: Posterior fossa is evaluated separately on brain MRI. No prevertebral effusion. Normal flow voids. Disc levels: C2-3: Negative C3-4: Small disc bulge without spinal canal stenosis. C4-5: Small diffuse disc bulge.  No spinal canal stenosis. C5-6: Left foraminal disc protrusion and uncovertebral hypertrophy with severe left foraminal stenosis. C6-7: Mild disc bulge without spinal canal stenosis. C7-T1: Negative THORACIC SPINE FINDINGS Alignment: Physiologic. Vertebrae: No fracture, evidence of discitis, or bone lesion. Cord: There is multifocal leptomeningeal enhancement along the length of the thoracic spinal cord. Slightly less extensive involvement of the thoracic spine and the cervical spine. Paraspinal tissues: Negative. Disc levels: No disc herniation or stenosis. IMPRESSION: 1. Multifocal leptomeningeal contrast enhancement along the length of the cervical and thoracic spinal cord, compatible with neurosarcoid. Other forms of inflammatory or infectious encephalomyelitis are considered less likely. 2. No cervical or thoracic spinal canal stenosis. 3. 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 Electronically Signed   By: Ulyses Jarred M.D.   On: 11/14/2019 00:46   MR THORACIC SPINE W WO CONTRAST  Result Date: 11/14/2019 CLINICAL DATA:  Encephalopathy and weakness. EXAM: MRI CERVICAL SPINE WITHOUT AND WITH CONTRAST MRI THORACIC SPINE WITHOUT AND WITH CONTRAST TECHNIQUE: Multiplanar and multiecho pulse sequences of the cervical spine, to include the craniocervical junction and cervicothoracic junction, were obtained without and with intravenous contrast. CONTRAST:  9.20mL GADAVIST GADOBUTROL 1 MMOL/ML IV SOLN COMPARISON:  None. FINDINGS: CERVICAL SPINE FINDINGS Alignment: Physiologic. Vertebrae: Heterogeneous bone marrow signal without focal lesion. Cord: There is hyperintense T2-weighted  signal within the cord at the cervicomedullary junction and C2 level. There is leptomeningeal contrast enhancement extending the length of the cervical spine, greatest at the cervicomedullary junction and the C2 and C3 levels. Posterior Fossa, vertebral arteries, paraspinal tissues: Posterior fossa is evaluated separately on brain MRI. No prevertebral effusion. Normal flow voids. Disc levels: C2-3: Negative C3-4: Small disc bulge without spinal canal stenosis. C4-5: Small diffuse disc bulge.  No spinal canal stenosis. C5-6: Left foraminal disc protrusion and uncovertebral hypertrophy with severe left foraminal stenosis. C6-7: Mild disc bulge without spinal canal stenosis. C7-T1: Negative THORACIC SPINE FINDINGS Alignment: Physiologic. Vertebrae:  No fracture, evidence of discitis, or bone lesion. Cord: There is multifocal leptomeningeal enhancement along the length of the thoracic spinal cord. Slightly less extensive involvement of the thoracic spine and the cervical spine. Paraspinal tissues: Negative. Disc levels: No disc herniation or stenosis. IMPRESSION: 1. Multifocal leptomeningeal contrast enhancement along the length of the cervical and thoracic spinal cord, compatible with neurosarcoid. Other forms of inflammatory or infectious encephalomyelitis are considered less likely. 2. No cervical or thoracic spinal canal stenosis. 3. 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 Electronically Signed   By: Ulyses Jarred M.D.   On: 11/14/2019 00:46   CT ABDOMEN PELVIS W CONTRAST  Result Date: 11/19/2019 CLINICAL DATA:  Concern for sarcoidosis. EXAM: CT CHEST, ABDOMEN, AND PELVIS WITH CONTRAST TECHNIQUE: Multidetector CT imaging of the chest, abdomen and pelvis was performed following the standard protocol during bolus administration of intravenous contrast. CONTRAST:  168mL OMNIPAQUE IOHEXOL 300 MG/ML  SOLN COMPARISON:  CT abdomen/pelvis 07/07/2018, CT chest  08/10/2018 and 11/02/2019 FINDINGS: CT CHEST FINDINGS Cardiovascular: Heart is normal size. Thoracic aorta is normal in caliber without dissection or aneurysm. Pulmonary arterial system is unremarkable. Vascular structures are otherwise unremarkable. Mediastinum/Nodes: There are several shoddy mediastinal lymph nodes none greater than a cm in size and slightly improved overall compared to the prior exam is likely reactive. No hilar adenopathy. Previously seen right infrahilar adenopathy resolved. Remaining mediastinal structures are within normal. No axillary adenopathy. Lungs/Pleura: Lungs are adequately inflated demonstrate stable 1-2 mm nodule over the periphery of the lateral right upper lobe. Minimal linear scarring/atelectasis right base. Left lung is clear. Airways are normal. Musculoskeletal: No focal abnormality. CT ABDOMEN PELVIS FINDINGS Hepatobiliary: Liver, gallbladder and biliary tree are normal. Pancreas: Normal. Spleen: Normal. Adrenals/Urinary Tract: Adrenal glands are normal. Kidneys are normal in size without hydronephrosis or nephrolithiasis. Subcentimeter hypodensity over the mid to lower pole left kidney unchanged likely a cyst. Ureters and bladder are normal. Stomach/Bowel: Stomach is normal. Few mildly prominent jejunal loops over the left upper quadrant measuring up to 3 cm in diameter. There is a relative narrowing of 1 of the jejunal loops with slight twisting of the mesentery. No inflammatory change or free fluid. Appendix is normal. Diverticulosis of the colon without active inflammation. Vascular/Lymphatic: Mild calcified plaque over the abdominal aorta which is normal in caliber. No adenopathy. Reproductive: Previous hysterectomy.  Adnexal regions unremarkable. Other: No significant free fluid or focal inflammatory change. Musculoskeletal: Mild spondylosis of the spine. Disc disease at the L4-5 and L5-S1 levels with grade 1 anterolisthesis of L4 on L5 unchanged. IMPRESSION: 1. Several  shoddy mediastinal lymph nodes. No evidence of mediastinal, hilar or axillary adenopathy. Previously seen right infrahilar adenopathy has resolved. No acute findings in the chest. 2. A few mildly prominent jejunal loops in the left upper quadrant with relative focal narrowing with associated twist of the mesentery. No adjacent free fluid or inflammatory change. Internal hernia could produce this appearance. Recommend attention on follow-up and consider follow-up abdominal radiographs to confirm no developing obstruction. 3.  Colonic diverticulosis. 4. Subcentimeter left renal cortical hypodensity too small to characterize but likely a cyst and unchanged. 5.  Aortic Atherosclerosis (ICD10-I70.0). 6. Stable grade 1 anterolisthesis of L4 on L5. Significant disc disease at the L4-5 and L5-S1 levels. Electronically Signed   By: Marin Olp M.D.   On: 11/19/2019 15:21   ECHOCARDIOGRAM COMPLETE  Result Date: 11/14/2019    ECHOCARDIOGRAM REPORT   Patient Name:   Angelamarie A  Magri Date of Exam: 11/14/2019 Medical Rec #:  [id number]      Height:       63.0 in Accession #:    [id number]     Weight:       163.1 lb Date of Birth:  1960/12/28      BSA:          1.773 m Patient Age:    61 years       BP:           130/89 mmHg Patient Gender: F              HR:           97 bpm. Exam Location:  Inpatient Procedure: 2D Echo Indications:    TIA  History:        Patient has prior history of Echocardiogram examinations, most                 recent 05/23/2019. Risk Factors:Hypertension.  Sonographer:    Mikki Santee RDCS (AE) Referring Phys: [id number] [place]  1. Left ventricular ejection fraction, by estimation, is 65 to 70%. The left ventricle has normal function. The left ventricle has no regional wall motion abnormalities. There is mild left ventricular hypertrophy. Left ventricular diastolic parameters were normal.  2. Right ventricular systolic function is normal. The right ventricular size is normal.  There is normal pulmonary artery systolic pressure.  3. The mitral valve is normal in structure. Trivial mitral valve regurgitation.  4. The aortic valve is tricuspid. Aortic valve regurgitation is not visualized.  5. The inferior vena cava is normal in size with greater than 50% respiratory variability, suggesting right atrial pressure of 3 mmHg. Comparison(s): No significant change from prior study. FINDINGS  Left Ventricle: Left ventricular ejection fraction, by estimation, is 65 to 70%. The left ventricle has normal function. The left ventricle has no regional wall motion abnormalities. The left ventricular internal cavity size was normal in size. There is  mild left ventricular hypertrophy. Left ventricular diastolic parameters were normal. Right Ventricle: The right ventricular size is normal. No increase in right ventricular wall thickness. Right ventricular systolic function is normal. There is normal pulmonary artery systolic pressure. The tricuspid regurgitant velocity is 2.00 m/s, and  with an assumed right atrial pressure of 3 mmHg, the estimated right ventricular systolic pressure is 63.7 mmHg. Left Atrium: Left atrial size was normal in size. Right Atrium: Right atrial size was normal in size. Pericardium: There is no evidence of pericardial effusion. Mitral Valve: The mitral valve is normal in structure. Trivial mitral valve regurgitation. Tricuspid Valve: The tricuspid valve is normal in structure. Tricuspid valve regurgitation is trivial. Aortic Valve: The aortic valve is tricuspid. Aortic valve regurgitation is not visualized. Pulmonic Valve: The pulmonic valve was normal in structure. Pulmonic valve regurgitation is not visualized. Aorta: The aortic root is normal in size and structure. Venous: The inferior vena cava is normal in size with greater than 50% respiratory variability, suggesting right atrial pressure of 3 mmHg. IAS/Shunts: No atrial level shunt detected by color flow Doppler.  LEFT  VENTRICLE PLAX 2D LVIDd:         4.00 cm  Diastology LVIDs:         2.50 cm  LV e' lateral:   6.42 cm/s LV PW:         1.20 cm  LV E/e' lateral: 12.1 LV IVS:        1.10 cm  LV  e' medial:    6.20 cm/s LVOT diam:     2.00 cm  LV E/e' medial:  12.6 LV SV:         74 LV SV Index:   42 LVOT Area:     3.14 cm  RIGHT VENTRICLE RV S prime:     9.25 cm/s TAPSE (M-mode): 2.0 cm LEFT ATRIUM             Index       RIGHT ATRIUM          Index LA diam:        2.80 cm 1.58 cm/m  RA Area:     9.14 cm LA Vol (A2C):   37.0 ml 20.87 ml/m RA Volume:   18.70 ml 10.55 ml/m LA Vol (A4C):   35.4 ml 19.96 ml/m LA Biplane Vol: 38.1 ml 21.49 ml/m  AORTIC VALVE LVOT Vmax:   123.00 cm/s LVOT Vmean:  85.700 cm/s LVOT VTI:    0.235 m  AORTA Ao Root diam: 2.60 cm MITRAL VALVE               TRICUSPID VALVE MV Area (PHT): 2.68 cm    TR Peak grad:   16.0 mmHg MV Decel Time: 283 msec    TR Vmax:        200.00 cm/s MV E velocity: 78.00 cm/s MV A velocity: 86.10 cm/s  SHUNTS MV E/A ratio:  0.91        Systemic VTI:  0.24 m                            Systemic Diam: 2.00 cm Dorris Carnes MD Electronically signed by Dorris Carnes MD Signature Date/Time: 11/14/2019/6:18:29 PM    Final    CT HEAD CODE STROKE WO CONTRAST  Result Date: 11/13/2019 CLINICAL DATA:  Code stroke.  Altered mental status EXAM: CT HEAD WITHOUT CONTRAST TECHNIQUE: Contiguous axial images were obtained from the base of the skull through the vertex without intravenous contrast. COMPARISON:  08/04/2019 FINDINGS: Brain: There is no acute intracranial hemorrhage, mass effect, or edema. No new loss of gray-white differentiation. Ventricles and sulci are normal in size and configuration. Patchy hypoattenuation in the supratentorial white matter is nonspecific but probably reflects stable chronic microvascular ischemic changes. 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 No extra-axial fluid collection. Vascular: No hyperdense vessel. Mild intracranial  atherosclerotic calcification is present at the skull base. Skull: Unremarkable Sinuses/Orbits: Aerated.  Orbits are unremarkable. Other: Opacification of the inferior right mastoid air cells. ASPECTS ([place] Stroke Program Early CT Score) - Ganglionic level infarction (caudate, lentiform nuclei, internal capsule, insula, M1-M3 cortex): 7 - Supraganglionic infarction (M4-M6 cortex): 3 Total score (0-10 with 10 being normal): 10 IMPRESSION: No acute intracranial hemorrhage or evidence of acute infarction. ASPECT score is 10. Stable chronic findings detailed above. These results were communicated to Dr. Lorraine Lax at 4:34 pmon 6/27/2021by text page via the [facility] messaging system. Electronically Signed   By: Macy Mis M.D.   On: 11/13/2019 16:36    Microbiology: Recent Results (from the past 240 hour(s))  SARS CORONAVIRUS 2 (TAT 6-24 HRS) Nasopharyngeal Nasopharyngeal Swab     Status: None   Collection Time: 11/27/19 11:15 AM   Specimen: Nasopharyngeal Swab  Result Value Ref Range Status   SARS Coronavirus 2 NEGATIVE NEGATIVE Final    Comment: (NOTE) SARS-CoV-2 target nucleic acids are NOT DETECTED.  The SARS-CoV-2  RNA is generally detectable in upper and lower respiratory specimens during the acute phase of infection. Negative results do not preclude SARS-CoV-2 infection, do not rule out co-infections with other pathogens, and should not be used as the sole basis for treatment or other patient management decisions. Negative results must be combined with clinical observations, patient history, and epidemiological information. The expected result is Negative.  Fact Sheet for Patients: SugarRoll.be  Fact Sheet for Healthcare Providers: https://www.woods-mathews.com/  This test is not yet approved or cleared by the [place] FDA and  has been authorized for detection and/or diagnosis of SARS-CoV-2 by FDA under an Emergency Use Authorization (EUA).  This EUA will remain  in effect (meaning this test can be used) for the duration of the COVID-19 declaration under Se ction 564(b)(1) of the Act, 21 U.S.C. section 360bbb-3(b)(1), unless the authorization is terminated or revoked sooner.  Performed at [facility], [place] [address] [place], [place] [address]       CBG: Recent Labs  Lab 11/22/19 [phone] 11/27/19 0831 11/28/19 1012  GLUCAP 118* 139* 140*       Signed:  Oswald Hillock MD.  [facility] 11/28/2019, 10:22 AM

## 2019-11-28 NOTE — TOC Transition Note (Signed)
Transition of Care [facility]) - CM/SW Discharge Note   Patient Details  Name: Rhonda [name] MRN: [id number] Date of Birth: 1961-03-27  [facility]) CM/SW Contact:  Angelita Ingles, RN Phone Number: [phone]  11/28/2019, 11:47 AM   Clinical Narrative:    Bed available at [facility] center inpatient rehab. Discharge summary and covid results faxed. Transport set up via [employer].Marland Kitchen Bedside nurse made aware. Discharge packet placed in chart. Please call report to [phone] Rm # [id number]   Final next level of care: [place] [facility]) Barriers to Discharge: No Barriers Identified   Patient Goals and CMS Choice Patient states their goals for this hospitalization and ongoing recovery are:: hopeful for INPT rehab CMS Medicare.gov Compare Post Acute Care list provided to:: Patient Choice offered to / list presented to : Patient  Discharge Placement                  Name of family member notified: Emmeline Winebarger husband Patient and family notified of of transfer: 11/28/19  Discharge Plan and Services   Discharge Planning Services: CM Consult Post Acute Care Choice: IP Rehab          DME Arranged: N/A DME Agency: NA       HH Arranged: NA HH Agency: NA        Social Determinants of Health (SDOH) Interventions     Readmission Risk Interventions No flowsheet data found.

## 2019-11-29 LAB — ACID FAST SMEAR (AFB, MYCOBACTERIA): Acid Fast Smear: NEGATIVE

## 2019-11-30 NOTE — Telephone Encounter (Signed)
LMOVM for patient to return call to schedule a consult with Dr. Loanne Drilling for Sarcoid and neurosarcoid per Dr. Valeta Harms.  ta

## 2019-12-05 LAB — CULTURE, FUNGUS WITHOUT SMEAR

## 2019-12-12 NOTE — Telephone Encounter (Signed)
Dr. Loanne Drilling doesn't have any available slots - We will wait for the September schedule -pr

## 2019-12-16 NOTE — Telephone Encounter (Signed)
I have placed a recall in pt's chart for the consult appt with Dr. Loanne Drilling per Dr. Valeta Harms.

## 2020-01-03 ENCOUNTER — Encounter (HOSPITAL_COMMUNITY): Payer: Self-pay | Admitting: Emergency Medicine

## 2020-01-03 ENCOUNTER — Emergency Department (HOSPITAL_COMMUNITY): Payer: BLUE CROSS/BLUE SHIELD

## 2020-01-03 ENCOUNTER — Emergency Department (HOSPITAL_COMMUNITY)
Admission: EM | Admit: 2020-01-03 | Discharge: 2020-01-03 | Disposition: A | Discharge status: Home / Self Care | Payer: BLUE CROSS/BLUE SHIELD | Attending: Emergency Medicine | Admitting: Emergency Medicine

## 2020-01-03 ENCOUNTER — Other Ambulatory Visit: Payer: Self-pay

## 2020-01-03 DIAGNOSIS — R4182 Altered mental status, unspecified: Secondary | ICD-10-CM | POA: Diagnosis present

## 2020-01-03 DIAGNOSIS — R410 Disorientation, unspecified: Secondary | ICD-10-CM | POA: Insufficient documentation

## 2020-01-03 DIAGNOSIS — I10 Essential (primary) hypertension: Secondary | ICD-10-CM | POA: Diagnosis not present

## 2020-01-03 DIAGNOSIS — R509 Fever, unspecified: Secondary | ICD-10-CM | POA: Diagnosis not present

## 2020-01-03 DIAGNOSIS — R404 Transient alteration of awareness: Secondary | ICD-10-CM | POA: Insufficient documentation

## 2020-01-03 LAB — COMPREHENSIVE METABOLIC PANEL
ALT: 38 U/L (ref 0–44)
AST: 31 U/L (ref 15–41)
Albumin: 3.3 g/dL — ABNORMAL LOW (ref 3.5–5.0)
Alkaline Phosphatase: 70 U/L (ref 38–126)
Anion gap: 11 (ref 5–15)
BUN: 9 mg/dL (ref 6–20)
CO2: 27 mmol/L (ref 22–32)
Calcium: 9 mg/dL (ref 8.9–10.3)
Chloride: 91 mmol/L — ABNORMAL LOW (ref 98–111)
Creatinine, Ser: 0.69 mg/dL (ref 0.44–1.00)
GFR calc Af Amer: >60 mL/min (ref >60)
GFR calc non Af Amer: >60 mL/min (ref >60)
Glucose, Bld: 95 mg/dL (ref 70–99)
Potassium: 3.9 mmol/L (ref 3.5–5.1)
Sodium: 129 mmol/L — ABNORMAL LOW (ref 135–145)
Total Bilirubin: 0.6 mg/dL (ref 0.3–1.2)
Total Protein: 6.1 g/dL — ABNORMAL LOW (ref 6.5–8.1)

## 2020-01-03 LAB — CBC
HCT: 42.3 % (ref 36.0–46.0)
Hemoglobin: 13.3 g/dL (ref 12.0–15.0)
MCH: 27.9 pg (ref 26.0–34.0)
MCHC: 31.4 g/dL (ref 30.0–36.0)
MCV: 88.7 fL (ref 80.0–100.0)
Platelets: 257 10*3/uL (ref 150–400)
RBC: 4.77 MIL/uL (ref 3.87–5.11)
RDW: 17.7 % — ABNORMAL HIGH (ref 11.5–15.5)
WBC: 15.2 10*3/uL — ABNORMAL HIGH (ref 4.0–10.5)
nRBC: 0 % (ref 0.0–0.2)

## 2020-01-03 LAB — URINALYSIS, ROUTINE W REFLEX MICROSCOPIC
Bilirubin Urine: NEGATIVE
Glucose, UA: NEGATIVE mg/dL
Hgb urine dipstick: NEGATIVE
Ketones, ur: NEGATIVE mg/dL
Nitrite: NEGATIVE
Protein, ur: NEGATIVE mg/dL
Specific Gravity, Urine: 1.013 (ref 1.005–1.030)
pH: 8 (ref 5.0–8.0)

## 2020-01-03 LAB — I-STAT BETA HCG BLOOD, ED (MC, WL, AP ONLY): I-stat hCG, quantitative: <5 m[IU]/mL (ref <5)

## 2020-01-03 MED ORDER — LEVETIRACETAM 500 MG PO TABS
500.0000 mg | ORAL_TABLET | Freq: Two times a day (BID) | ORAL | 1 refills | Status: DC
Start: 2020-01-03 — End: 2020-01-09

## 2020-01-03 MED ORDER — LEVETIRACETAM 500 MG PO TABS
500.0000 mg | ORAL_TABLET | Freq: Once | ORAL | Status: AC
  Administered 2020-01-03: 500 mg via ORAL
  Filled 2020-01-03: qty 1

## 2020-01-03 NOTE — ED Notes (Signed)
Discharge instructions discussed with pt. Pt verbalize understanding with no questions at this time. Pt to go home with family at this time

## 2020-01-03 NOTE — ED Provider Notes (Signed)
[facility] Emergency Department Provider Note MRN:  [id number]  Arrival date & time: 01/03/20     Chief Complaint   Altered Mental Status   History of Present Illness   Rhonda [name] is a 59 y.o. year-old female with a history of hypertension, neurosarcoidosis presenting to the ED with chief complaint of confusion.  Multiple hours of confusion yesterday afternoon.  Daughter explains that she was repeating herself, repeating anything that was said to her by others and this continued for several hours.  Was not acting her normal self.  Sent here from care facility.  She is now back to her baseline, she does not recall this incident, she denies any new numbness or weakness, no pain, no headache, no complaints.  Review of Systems  A complete 10 system review of systems was obtained and all systems are negative except as noted in the HPI and PMH.   Patient's Health History    Past Medical History:  Diagnosis Date  . Arthritis   . Degenerative disc disease, lumbar   . Hypertension   . Prediabetes     Past Surgical History:  Procedure Laterality Date  . ABDOMINAL HYSTERECTOMY    . BREAST EXCISIONAL BIOPSY Right   . BREAST SURGERY     right breast  . CESAREAN SECTION      Family History  Problem Relation Age of Onset  . Cancer Mother   . Hypertension Sister   . Diabetes Sister   . Diabetes Maternal Grandmother     Social History   Socioeconomic History  . Marital status: Married    Spouse name: Not on file  . Number of children: Not on file  . Years of education: Not on file  . Highest education level: Not on file  Occupational History  . Not on file  Tobacco Use  . Smoking status: Never Smoker  . Smokeless tobacco: Never Used  Vaping Use  . Vaping Use: Never used  Substance and Sexual Activity  . Alcohol use: No  . Drug use: No  . Sexual activity: Yes    Birth control/protection: None  Other Topics Concern  . Not on file  Social  History Narrative  . Not on file   Social Determinants of Health   Financial Resource Strain:   . Difficulty of Paying Living Expenses:   Food Insecurity: No Food Insecurity  . Worried About Charity fundraiser in the Last Year: Never true  . Ran Out of Food in the Last Year: Never true  Transportation Needs: No Transportation Needs  . Lack of Transportation (Medical): No  . Lack of Transportation (Non-Medical): No  Physical Activity:   . Days of Exercise per Week:   . Minutes of Exercise per Session:   Stress:   . Feeling of Stress :   Social Connections: Socially Integrated  . Frequency of Communication with Friends and Family: More than three times a week  . Frequency of Social Gatherings with Friends and Family: More than three times a week  . Attends Religious Services: 1 to 4 times per year  . Active Member of Clubs or Organizations: Yes  . Attends Archivist Meetings: 1 to 4 times per year  . Marital Status: Married  Human resources officer Violence:   . Fear of Current or Ex-Partner:   . Emotionally Abused:   Marland Kitchen Physically Abused:   . Sexually Abused:      Physical Exam   Vitals:   01/03/20  1445 01/03/20 1516  BP: 108/75 115/82  Pulse: 78 78  Resp: (!) 24 20  Temp:    SpO2: 100% 100%    CONSTITUTIONAL: Chronically ill-appearing, NAD NEURO:  Alert and oriented x 3, normal and symmetric strength and sensation, normal coordination, normal speech EYES:  eyes equal and reactive ENT/NECK:  no LAD, no JVD CARDIO: Regular rate, well-perfused, normal S1 and S2 PULM:  CTAB no wheezing or rhonchi GI/GU:  normal bowel sounds, non-distended, non-tender MSK/SPINE:  No gross deformities, no edema SKIN:  no rash, atraumatic PSYCH:  Appropriate speech and behavior  *Additional and/or pertinent findings included in MDM below  Diagnostic and Interventional Summary    EKG Interpretation  Date/Time:    Ventricular Rate:    PR Interval:    QRS Duration:   QT  Interval:    QTC Calculation:   R Axis:     Text Interpretation:        Labs Reviewed  COMPREHENSIVE METABOLIC PANEL - Abnormal; Notable for the following components:      Result Value   Sodium 129 (*)    Chloride 91 (*)    Total Protein 6.1 (*)    Albumin 3.3 (*)    All other components within normal limits  CBC - Abnormal; Notable for the following components:   WBC 15.2 (*)    RDW 17.7 (*)    All other components within normal limits  URINALYSIS, ROUTINE W REFLEX MICROSCOPIC - Abnormal; Notable for the following components:   Color, Urine AMBER (*)    APPearance CLOUDY (*)    Leukocytes,Ua LARGE (*)    Bacteria, UA RARE (*)    All other components within normal limits  CBG MONITORING, ED  I-STAT BETA HCG BLOOD, ED ([facility], [facility], [facility] ONLY)    CT Head Wo Contrast  Final Result      Medications  levETIRAcetam (KEPPRA) tablet 500 mg (has no administration in time range)     Procedures  /  Critical Care Procedures  ED Course and Medical Decision Making  I have reviewed the triage vital signs, the nursing notes, and pertinent available records from the EMR.  Listed above are laboratory and imaging tests that I personally ordered, reviewed, and interpreted and then considered in my medical decision making (see below for details).  Recent admission for neurological changes with MRI revealing neurosarcoidosis, demyelinating changes.  Reportedly had 4 to 5 hours of altered mental status last night, but seems to be fully recovered at this time.  CT head is without acute process, will discuss need for any other imaging with neurology.  Still awaiting urinalysis.     Urinalysis seems contaminated, will send for culture.  I do not think that empiric treatment is necessary at this time given patient's lack of fever, no dysuria, no suprapubic tenderness.  Discussed with Dr. Leonel Ramsay, who feels that these symptoms may be related to a partial seizure.  Given that this is the second time  she has exhibited the symptoms, she is appropriate for Keppra 500 mg twice daily.  Patient has mild hyponatremia of unclear chronicity, she has some mild lower extremity edema which she says has been present for a few weeks.  Suspect she has some mild hyponatremia due to hypervolemia, she says that her PCP has been considering starting her on Lasix for some time now, advise close PCP follow-up to further manage these issues.  Barth Kirks. [place]onia Small, [place] [email or web address]  Final Clinical Impressions(s) / ED Diagnoses     ICD-10-CM   1. Altered awareness, transient  R40.4     ED Discharge Orders    None       Discharge Instructions Discussed with and Provided to Patient:     Discharge Instructions     You were evaluated in the Emergency Department and after careful evaluation, we did not find any emergent condition requiring admission or further testing in the hospital.  Your exam/testing today was overall reassuring.  We discussed your symptoms with the neurologist and I think you may be having seizures.  Please take the [place] antiseizure medication as directed and follow-up with your neurologist.  We also recommend follow-up with your primary care doctor to discuss your leg swelling.  Please return to the Emergency Department if you experience any worsening of your condition.  Thank you for allowing [place] to be a part of your care.        Maudie Flakes, MD 01/03/20 [phone]

## 2020-01-03 NOTE — ED Triage Notes (Signed)
Patient from home, patient does have history of stroke and demyelinating brain changes that she has been seen at [facility] for.  Patient gets most of her care in [place].  Patient continues with altered status, repetitive speech, repeating what is said to her.  Family has not been able to get her to sleep tonight, she does follow commands.  LSN 8am on the 8/16.  Patient was getting worse throughout the day.

## 2020-01-03 NOTE — Discharge Instructions (Addendum)
You were evaluated in the Emergency Department and after careful evaluation, we did not find any emergent condition requiring admission or further testing in the hospital.  Your exam/testing today was overall reassuring.  We discussed your symptoms with the neurologist and I think you may be having seizures.  Please take the [place] antiseizure medication as directed and follow-up with your neurologist.  We also recommend follow-up with your primary care doctor to discuss your leg swelling.  Please return to the Emergency Department if you experience any worsening of your condition.  Thank you for allowing [place] to be a part of your care.

## 2020-01-05 LAB — ACID FAST CULTURE WITH REFLEXED SENSITIVITIES (MYCOBACTERIA): Acid Fast Culture: NEGATIVE

## 2020-01-06 LAB — URINE CULTURE

## 2020-01-07 ENCOUNTER — Encounter (HOSPITAL_COMMUNITY): Payer: Self-pay | Admitting: Emergency Medicine

## 2020-01-07 ENCOUNTER — Other Ambulatory Visit: Payer: Self-pay

## 2020-01-07 ENCOUNTER — Observation Stay (HOSPITAL_COMMUNITY)
Admission: EM | Admit: 2020-01-07 | Discharge: 2020-01-09 | Disposition: A | Discharge status: Home / Self Care | Payer: BLUE CROSS/BLUE SHIELD | Attending: Internal Medicine | Admitting: Internal Medicine

## 2020-01-07 DIAGNOSIS — R479 Unspecified speech disturbances: Secondary | ICD-10-CM

## 2020-01-07 DIAGNOSIS — Z79899 Other long term (current) drug therapy: Secondary | ICD-10-CM | POA: Diagnosis not present

## 2020-01-07 DIAGNOSIS — R7303 Prediabetes: Secondary | ICD-10-CM | POA: Diagnosis present

## 2020-01-07 DIAGNOSIS — E871 Hypo-osmolality and hyponatremia: Secondary | ICD-10-CM | POA: Diagnosis not present

## 2020-01-07 DIAGNOSIS — R413 Other amnesia: Secondary | ICD-10-CM | POA: Diagnosis present

## 2020-01-07 DIAGNOSIS — Z20822 Contact with and (suspected) exposure to covid-19: Secondary | ICD-10-CM | POA: Diagnosis not present

## 2020-01-07 DIAGNOSIS — D72829 Elevated white blood cell count, unspecified: Secondary | ICD-10-CM | POA: Diagnosis not present

## 2020-01-07 DIAGNOSIS — Z7982 Long term (current) use of aspirin: Secondary | ICD-10-CM | POA: Insufficient documentation

## 2020-01-07 DIAGNOSIS — I10 Essential (primary) hypertension: Secondary | ICD-10-CM | POA: Diagnosis not present

## 2020-01-07 DIAGNOSIS — Z7984 Long term (current) use of oral hypoglycemic drugs: Secondary | ICD-10-CM | POA: Diagnosis not present

## 2020-01-07 DIAGNOSIS — R4182 Altered mental status, unspecified: Principal | ICD-10-CM | POA: Insufficient documentation

## 2020-01-07 DIAGNOSIS — G4733 Obstructive sleep apnea (adult) (pediatric): Secondary | ICD-10-CM | POA: Diagnosis not present

## 2020-01-07 DIAGNOSIS — R2243 Localized swelling, mass and lump, lower limb, bilateral: Secondary | ICD-10-CM | POA: Diagnosis not present

## 2020-01-07 HISTORY — DX: Unspecified convulsions: R56.9

## 2020-01-07 HISTORY — DX: Gastro-esophageal reflux disease without esophagitis: K21.9

## 2020-01-07 HISTORY — DX: Unspecified urinary incontinence: R32

## 2020-01-07 HISTORY — DX: Obstructive sleep apnea (adult) (pediatric): G47.33

## 2020-01-07 LAB — CBC
HCT: 37.7 % (ref 36.0–46.0)
Hemoglobin: 11.9 g/dL — ABNORMAL LOW (ref 12.0–15.0)
MCH: 28.1 pg (ref 26.0–34.0)
MCHC: 31.6 g/dL (ref 30.0–36.0)
MCV: 88.9 fL (ref 80.0–100.0)
Platelets: 272 10*3/uL (ref 150–400)
RBC: 4.24 MIL/uL (ref 3.87–5.11)
RDW: 17.4 % — ABNORMAL HIGH (ref 11.5–15.5)
WBC: 14.9 10*3/uL — ABNORMAL HIGH (ref 4.0–10.5)
nRBC: 0 % (ref 0.0–0.2)

## 2020-01-07 LAB — COMPREHENSIVE METABOLIC PANEL
ALT: 33 U/L (ref 0–44)
AST: 28 U/L (ref 15–41)
Albumin: 2.8 g/dL — ABNORMAL LOW (ref 3.5–5.0)
Alkaline Phosphatase: 60 U/L (ref 38–126)
Anion gap: 6 (ref 5–15)
BUN: 10 mg/dL (ref 6–20)
CO2: 32 mmol/L (ref 22–32)
Calcium: 9 mg/dL (ref 8.9–10.3)
Chloride: 92 mmol/L — ABNORMAL LOW (ref 98–111)
Creatinine, Ser: 0.72 mg/dL (ref 0.44–1.00)
GFR calc Af Amer: >60 mL/min (ref >60)
GFR calc non Af Amer: >60 mL/min (ref >60)
Glucose, Bld: 108 mg/dL — ABNORMAL HIGH (ref 70–99)
Potassium: 3.9 mmol/L (ref 3.5–5.1)
Sodium: 130 mmol/L — ABNORMAL LOW (ref 135–145)
Total Bilirubin: 0.4 mg/dL (ref 0.3–1.2)
Total Protein: 5.2 g/dL — ABNORMAL LOW (ref 6.5–8.1)

## 2020-01-07 LAB — PROTIME-INR
INR: 1 (ref 0.8–1.2)
Prothrombin Time: 12.5 seconds (ref 11.4–15.2)

## 2020-01-07 LAB — I-STAT CHEM 8, ED
BUN: 10 mg/dL (ref 6–20)
Calcium, Ion: 1.18 mmol/L (ref 1.15–1.40)
Chloride: 89 mmol/L — ABNORMAL LOW (ref 98–111)
Creatinine, Ser: 0.7 mg/dL (ref 0.44–1.00)
Glucose, Bld: 104 mg/dL — ABNORMAL HIGH (ref 70–99)
HCT: 37 % (ref 36.0–46.0)
Hemoglobin: 12.6 g/dL (ref 12.0–15.0)
Potassium: 4 mmol/L (ref 3.5–5.1)
Sodium: 129 mmol/L — ABNORMAL LOW (ref 135–145)
TCO2: 29 mmol/L (ref 22–32)

## 2020-01-07 LAB — APTT: aPTT: 24 seconds (ref 24–36)

## 2020-01-07 LAB — ETHANOL: Alcohol, Ethyl (B): <10 mg/dL (ref <10)

## 2020-01-07 MED ORDER — SODIUM CHLORIDE 0.9 % IV SOLN
2000.0000 mg | Freq: Once | INTRAVENOUS | Status: AC
  Administered 2020-01-07: 2000 mg via INTRAVENOUS
  Filled 2020-01-07: qty 20

## 2020-01-07 MED ORDER — LEVETIRACETAM IN NACL 1000 MG/100ML IV SOLN: 1000.0000 mg | Freq: Once | INTRAVENOUS | Status: DC

## 2020-01-07 NOTE — ED Provider Notes (Signed)
[facility] EMERGENCY DEPARTMENT Provider Note   CSN: [id number] Arrival date & time: 01/07/20  2151     History Chief Complaint  Patient presents with  . Seizures    Rhonda [name] is a 59 y.o. female with a hx of hypertension, prediabetes, seizures, neurosarcoid presents to the Emergency Department complaining of gradual, persistent, progressively worsening finding difficulty onset around 8 PM.   5 caveat for altered mental status, aphasia.  History provided by Husband and record review.  Husband reports he noticed other cognitive changes around 5 PM in which patient began repeating things and she seemed to be confused but her word finding difficulties began around 8 PM.  Has been reports this is the third time she has had symptoms similar to this.  He reports after the last episode she was given "a medication for seizures".  Per the record this is Keppra 500 mg twice daily.  Husband reports no missed doses.  Husband denies any changes, fevers, chills, weakness, dizziness or syncope.  He does endorse 3 episodes of NBNB vomiting between 5pm and arrival to the emergency department.  Records reviewed.  Patient was last evaluated for an episode similar to this on 01/03/2020.  Per the ED note Dr. Leonel Ramsay of neurology was consulted and suggested this was likely secondary to partial seizures and recommended starting Keppra.  Additionally, patient was hyponatremic at that visit and there was some concern for fluid overload.  Patient has been out of her Lasix for several weeks and has not yet been able to get that refilled.          The history is provided by the patient and medical records. No language interpreter was used.       Past Medical History:  Diagnosis Date  . Arthritis   . Degenerative disc disease, lumbar   . Hypertension   . Prediabetes   . Seizures [facility])     Patient Active Problem List   Diagnosis Date Noted  . Demyelinating changes in brain  ([place]) 11/13/2019  . Acute CVA (cerebrovascular accident) ([place]) 05/23/2019  . Transaminitis 05/23/2019  . Nausea and vomiting 05/23/2019  . Leukocytosis 05/23/2019  . Thoracic lymphadenopathy 08/26/2018  . Axillary lymphadenopathy 08/26/2018  . Piriformis syndrome of right side 09/29/2016  . OSA (obstructive sleep apnea) 09/27/2015  . Right hip pain 04/11/2015  . Essential hypertension 11/07/2014  . Chronic low back pain 11/07/2014  . Prediabetes 11/07/2014  . Vitamin D deficiency 11/07/2014    Past Surgical History:  Procedure Laterality Date  . ABDOMINAL HYSTERECTOMY    . BREAST EXCISIONAL BIOPSY Right   . BREAST SURGERY     right breast  . CESAREAN SECTION       OB History    Gravida  5   Para      Term      Preterm      AB  1   Living  4     SAB  1   TAB      Ectopic      Multiple  1   Live Births  4           Family History  Problem Relation Age of Onset  . Cancer Mother   . Hypertension Sister   . Diabetes Sister   . Diabetes Maternal Grandmother     Social History   Tobacco Use  . Smoking status: Never Smoker  . Smokeless tobacco: Never Used  Vaping Use  .  Vaping Use: Never used  Substance Use Topics  . Alcohol use: No  . Drug use: No    Home Medications Prior to Admission medications   Medication Sig Start Date End Date Taking? Authorizing Provider  acetaminophen (TYLENOL) 650 MG CR tablet Take 1,300 mg by mouth 2 (two) times daily as needed (arthritis pain).    Yes [provider]  amLODipine (NORVASC) 5 MG tablet Take 1 tablet (5 mg total) by mouth daily. 11/28/19  Yes Oswald Hillock, MD  APPLE CIDER VINEGAR PO Take 2 capsules by mouth at bedtime.   Yes [provider]  aspirin EC 81 MG tablet Take 1 tablet (81 mg total) by mouth daily. 05/24/19 05/23/20 Yes Dessa Phi, DO  Cyanocobalamin (VITAMIN B-12 PO) Take 1 tablet by mouth daily.    Yes [provider]  glucosamine-chondroitin 500-400 MG tablet  Take 1 tablet by mouth 3 (three) times daily. Patient taking differently: Take 2 tablets by mouth daily.  04/11/15  Yes Funches, Adriana Mccallum, MD  levETIRAcetam (KEPPRA) 500 MG tablet Take 1 tablet (500 mg total) by mouth 2 (two) times daily. 01/03/20  Yes Maudie Flakes, MD  lisinopril (ZESTRIL) 5 MG tablet Take 5 mg by mouth daily. 12/20/19  Yes [provider]  Multiple Vitamin (MULTIVITAMIN WITH MINERALS) TABS tablet Take 1 tablet by mouth daily.   Yes [provider]  Omega-3 Fatty Acids (FISH OIL) 1000 MG CAPS Take 1,000 mg by mouth at bedtime.   Yes [provider]  oxybutynin (DITROPAN) 5 MG tablet Take 5 mg by mouth 3 (three) times daily. 12/14/19  Yes [provider]  pantoprazole (PROTONIX) 40 MG tablet Take 1 tablet (40 mg total) by mouth daily. 11/28/19  Yes Oswald Hillock, MD  atorvastatin (LIPITOR) 80 MG tablet Take 1 tablet (80 mg total) by mouth daily at 6 PM. Patient taking differently: Take 80 mg by mouth daily.  05/24/19 11/13/19  Dessa Phi, DO  lisinopril (ZESTRIL) 10 MG tablet Take 1 tablet (10 mg total) by mouth daily. Patient not taking: Reported on 01/08/2020 11/28/19 11/27/20  Oswald Hillock, MD  PRESCRIPTION MEDICATION Inhale into the lungs See admin instructions. CPAP- At bedtime    [provider]    Allergies    Aspirin and Augmentin [amoxicillin-pot clavulanate]  Review of Systems   Review of Systems  Constitutional: Negative for appetite change, diaphoresis, fatigue, fever and unexpected weight change.  HENT: Negative for mouth sores.   Eyes: Negative for visual disturbance.  Respiratory: Negative for cough, chest tightness, shortness of breath and wheezing.   Cardiovascular: Negative for chest pain.  Gastrointestinal: Positive for vomiting. Negative for abdominal pain, constipation, diarrhea and nausea.  Endocrine: Negative for polydipsia, polyphagia and polyuria.  Genitourinary: Negative for dysuria, frequency, hematuria  and urgency.  Musculoskeletal: Negative for back pain and neck stiffness.  Skin: Negative for rash.  Allergic/Immunologic: Negative for immunocompromised state.  Neurological: Positive for seizures ( questionable) and speech difficulty. Negative for syncope, light-headedness and headaches.  Hematological: Does not bruise/bleed easily.  Psychiatric/Behavioral: Negative for sleep disturbance. The patient is not nervous/anxious.     Physical Exam Updated Vital Signs BP 114/86   Pulse 72   Resp 17   Ht 5\' 3"  (1.6 m)   Wt 82 kg   SpO2 100%   BMI 32.02 kg/m   Physical Exam Vitals and nursing note reviewed.  Constitutional:      General: She is not in acute distress.  Appearance: She is well-developed. She is not diaphoretic.  HENT:     Head: Normocephalic and atraumatic.  Eyes:     General: No scleral icterus.    Conjunctiva/sclera: Conjunctivae normal.     Pupils: Pupils are equal, round, and reactive to light.     Comments: No horizontal, vertical or rotational nystagmus  Neck:     Comments: Full active and passive ROM without pain No midline or paraspinal tenderness No nuchal rigidity or meningeal signs Cardiovascular:     Rate and Rhythm: Normal rate and regular rhythm.  Pulmonary:     Effort: Pulmonary effort is normal. No respiratory distress.     Breath sounds: Normal breath sounds. No wheezing or rales.  Abdominal:     General: Bowel sounds are normal.     Palpations: Abdomen is soft.     Tenderness: There is no abdominal tenderness. There is no guarding or rebound.  Musculoskeletal:        General: Normal range of motion.     Cervical back: Normal range of motion and neck supple.     Right lower leg: Edema present.     Left lower leg: Edema present.  Lymphadenopathy:     Cervical: No cervical adenopathy.  Skin:    General: Skin is warm and dry.     Findings: No rash.  Neurological:     Mental Status: She is alert and oriented to person, place, and time.      Cranial Nerves: No cranial nerve deficit.     Motor: No abnormal muscle tone.     Coordination: Coordination normal.     Comments: Mental Status:  Alert, oriented to person place and year.  Does not know month.  Slow speech and word finding difficulties.    Cranial Nerves:  II:  Peripheral visual fields grossly normal, pupils equal, round, reactive to light III,IV, VI: ptosis not present, extra-ocular motions intact bilaterally  V,VII: smile symmetric, facial light touch sensation equal VIII: hearing grossly normal bilaterally  IX,X: midline uvula rise  XI: bilateral shoulder shrug equal and strong XII: midline tongue extension   Motor:  5/5 in upper and lower extremities bilaterally including strong and equal grip strength and dorsiflexion/plantar flexion  Sensory: light touch normal in all extremities.   Psychiatric:        Behavior: Behavior normal.        Thought Content: Thought content normal.        Judgment: Judgment normal.     ED Results / Procedures / Treatments   Labs (all labs ordered are listed, but only abnormal results are displayed) Labs Reviewed  CBC - Abnormal; Notable for the following components:      Result Value   WBC 14.9 (*)    Hemoglobin 11.9 (*)    RDW 17.4 (*)    All other components within normal limits  DIFFERENTIAL - Abnormal; Notable for the following components:   Neutro Abs 13.0 (*)    All other components within normal limits  COMPREHENSIVE METABOLIC PANEL - Abnormal; Notable for the following components:   Sodium 130 (*)    Chloride 92 (*)    Glucose, Bld 108 (*)    Total Protein 5.2 (*)    Albumin 2.8 (*)    All other components within normal limits  I-STAT CHEM 8, ED - Abnormal; Notable for the following components:   Sodium 129 (*)    Chloride 89 (*)    Glucose, Bld 104 (*)  All other components within normal limits  SARS CORONAVIRUS 2 BY RT PCR (HOSPITAL ORDER, [place] [facility])  ETHANOL  PROTIME-INR   APTT  RAPID URINE DRUG SCREEN, HOSP PERFORMED  URINALYSIS, ROUTINE W REFLEX MICROSCOPIC    EKG None  Radiology CT HEAD WO CONTRAST  Result Date: 01/08/2020 CLINICAL DATA:  Seizure EXAM: CT HEAD WITHOUT CONTRAST TECHNIQUE: Contiguous axial images were obtained from the base of the skull through the vertex without intravenous contrast. COMPARISON:  01/03/2020 none FINDINGS: Brain: Normal anatomic configuration. Mild parenchymal volume loss is commensurate with the patient's age. No abnormal intra or extra-axial mass lesion or fluid collection. No abnormal mass effect or midline shift. No evidence of acute intracranial hemorrhage or infarct. Ventricular size is normal. Cerebellum unremarkable. Vascular: No asymmetric hyperdense vasculature at the skull base Skull: Intact Sinuses/Orbits: Minimal mucus within the right sphenoid sinus. Remaining paranasal sinuses are clear. Orbits are unremarkable. Other: Mastoid air cells and middle ear cavities are clear. IMPRESSION: 1. No evidence of acute intracranial abnormality. 2. Minimal right sphenoid sinus disease. Electronically Signed   By: Fidela Salisbury MD   On: 01/08/2020 01:39    Procedures Procedures (including critical care time)  Medications Ordered in ED Medications  levETIRAcetam (KEPPRA) 2,000 mg in sodium chloride 0.9 % 250 mL IVPB (0 mg Intravenous Stopped 01/08/20 0110)    ED Course  I have reviewed the triage vital signs and the nursing notes.  Pertinent labs & imaging results that were available during my care of the patient were reviewed by me and considered in my medical decision making (see chart for details).  Clinical Course as of Jan 07 209  Sat Jan 07, 2020  2259 Discussed with Dr. Lorrin Goodell who recommends Keppra 2g and MRI.  He will evaluate   [HM]  Sun Jan 08, 2020  0205 Hyponatremia  Sodium(!): 129 [HM]  0207 Slightly below baseline  Hemoglobin(!): 11.9 [HM]  0209 No evidence of intracranial hemorrhage or other  acute pathology.  Personally evaluated these images.  CT HEAD WO CONTRAST [HM]    Clinical Course User Index [HM] Candice Lunney, Gwenlyn Perking   MDM Rules/Calculators/A&P                           Patient presents with word finding difficulties.  Question possible status partial seizure versus expanding lesion of her neurosarcoidosis.  Will be evaluated by neurology.  Labs, CT scan and MRI pending.  Keppra ordered.  The patient was discussed with and seen by Dr. Tomi Bamberger who agrees with the treatment plan.  1:00 AM Patient evaluated by neurology.  At this time less likely to be seizure.  Some concern for possible disordered sleep from prednisone and questionable history of a sleep apnea.  Will obtain MRI to rule out progressive neurosarcoidosis and EEG to rule out seizure activity.  Patient will be admitted by the hospitalist and neurology will continue to consult.   Final Clinical Impression(s) / ED Diagnoses Final diagnoses:  Altered mental status, unspecified altered mental status type  Hyponatremia  Speech disturbance, unspecified type    Rx / DC Orders ED Discharge Orders    None       Kezia Benevides, Gwenlyn Perking 01/08/20 Alger Simons, MD 01/08/20 [phone]

## 2020-01-07 NOTE — Consult Note (Addendum)
NEUROLOGY CONSULTATION NOTE   Date of service: January 07, 2020 Patient Name: Rhonda [name] MRN:  [id number] DOB:  02-17-1961 Reason for consult: "seizure"  History of Present Illness  CATLYN SHIPTON is a 59 y.o. 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  Husband at bedside reports that this is the 3rd episode that the patient has had. The episode starts off with patient being a little irritated or argumentative, it feels like she is losing her memory of some of the events but does remember other events during the episode. This goes on for a couple hours/ Patient describes this as sleep walking. She remembers part of the episode, lke for the episode today, she remembers eating dinner being at her daughter;s place during the episode but does not remember coming to the hospital. Sleeping helps with these episodes but she is not particularly somnolent after these episodes.  She was recently seen in the ED on 8/17 with episode of confusion with repeating herself and repeating anything that was said to her for 4-5 hours. This was briefly discussed with our team and thought to be a ?seizure. She was started on Keppra 500mg  BID and discharged.  During the episode, patient and husband deny any arm or leg shaking or numbness, no slurring of her speech, no aphasia, no dysarthria. She does endorse excessive daytime sleepiness, endorses that she is compliant with her CPAP.  ROS   Constitutional Denies weight loss, fever and chills.  HEENT Denies changes in vision and hearing.  Respiratory Denies SOB and cough.  CV Denies palpitations and CP  GI Denies abdominal pain, nausea, vomiting and diarrhea.  GU Denies dysuria and urinary frequency.  MSK Denies myalgia and joint pain.  Skin Denies rash and pruritus.   Neurological Denies headache and syncope.  Psychiatric Denies recent changes in mood. Denies anxiety and depression.   Past History   Past Medical History:  Diagnosis Date  . Arthritis   . Degenerative disc disease, lumbar   . Hypertension   . Prediabetes   . Seizures ([place])    Past Surgical History:  Procedure Laterality Date  . ABDOMINAL HYSTERECTOMY    . BREAST EXCISIONAL BIOPSY Right   . BREAST SURGERY     right breast  . CESAREAN SECTION     Family History  Problem Relation Age of Onset  . Cancer Mother   . Hypertension Sister   . Diabetes Sister   . Diabetes Maternal Grandmother    Social History   Socioeconomic History  . Marital status: Married    Spouse name: Not on file  . Number of children: Not on file  . Years of education: Not on file  . Highest education level: Not on file  Occupational History  . Not on file  Tobacco Use  . Smoking status: Never Smoker  . Smokeless tobacco: Never Used  Vaping Use  . Vaping Use: Never used  Substance and Sexual Activity  . Alcohol use: No  . Drug use: No  . Sexual activity: Yes    Birth control/protection: None  Other Topics Concern  . Not on file  Social History Narrative  . Not on file   Social Determinants of Health   Financial Resource Strain:   . Difficulty of Paying Living Expenses: Not on file  Food Insecurity: No Food Insecurity  .  Worried About Charity fundraiser in the Last Year: Never true  . Ran Out of Food in the Last Year: Never true  Transportation Needs: No Transportation Needs  . Lack of Transportation (Medical): No  . Lack of Transportation (Non-Medical): No  Physical Activity:   . Days of Exercise per Week: Not on file  . Minutes of Exercise per Session: Not on file  Stress:   . Feeling of Stress : Not on file  Social Connections: Socially Integrated  . Frequency of Communication with Friends and Family: More than three times a week  . Frequency of Social Gatherings with Friends  and Family: More than three times a week  . Attends Religious Services: 1 to 4 times per year  . Active Member of Clubs or Organizations: Yes  . Attends Archivist Meetings: 1 to 4 times per year  . Marital Status: Married   Allergies  Allergen Reactions  . Aspirin Nausea And Vomiting  . Augmentin [Amoxicillin-Pot Clavulanate] Swelling and Other (See Comments)    Hand swelled at IV site    Medications  (Not in a hospital admission)    Vitals  Weight:  [82 kg] 82 kg (08/21 2202)  Body mass index is 32.02 kg/m.  Physical Exam   General: Laying comfortably in bed; in no acute distress.  HENT: Normal oropharynx and mucosa. Normal external appearance of ears and nose. Neck: Supple, no pain or tenderness CV: No JVD. No peripheral edema. Pulmonary: Symmetric Chest rise. Normal respiratory effort. Abdomen: Soft to touch, non-tender Ext: No cyanosis, edema, or deformity  Skin: No rash. Normal palpation of skin.   Musculoskeletal: Normal digits and nails by inspection. No clubbing.  Neurologic Examination  Mental status/Cognition: Alert, oriented to self, place, month and year, good attention. Speech/language: Fluent, comprehension intact, object naming intact, repetition intact. Cranial nerves:   CN II Pupils equal and reactive to light, no VF deficits   CN III,IV,VI EOM intact, no gaze preference or deviation, no nystagmus   CN V normal sensation in V1, V2, and V3 segments bilaterally   CN VII no asymmetry, no nasolabial fold flattening   CN VIII normal hearing to speech   CN IX & X normal palatal elevation, no uvular deviation   CN XI 5/5 head turn and 5/5 shoulder shrug bilaterally   CN XII midline tongue protrusion   Motor:  Muscle bulk: normal, tone normal, pronator drift none Mvmt Root Nerve  Muscle Right Left Comments  SA C5/6 Ax Deltoid 5 5   EF C5/6 Mc Biceps 5 5   EE C6/7/8 Rad Triceps 5 5   WF C6/7 Med FCR 5 5   WE C7/8 PIN ECU 5 5   F Ab C8/T1 U  ADM/FDI 5 5   HF L1/2/3 Fem Illopsoas 3 3   KE L2/3/4 Fem Quad 4 4   DF L4/5 D Peron Tib Ant 4 4   PF S1/2 Tibial Grc/Sol 4 4    Reflexes:  Right Left Comments  Pectoralis      Biceps (C5/6) 1 1   Brachioradialis (C5/6) 1 1    Triceps (C6/7) 1 1    Patellar (L3/4) 1 1    Achilles (S1) 1 1    Hoffman      Plantar     Jaw jerk    Sensation:  Light touch intact   Pin prick Decreased in R anterior shin and dorsal aspect of foot   Temperature    Vibration  Decreased in L foot  Proprioception    Coordination/Complex Motor:  - Finger to Nose intact - Heel to shin unable to assess due to weakness - Rapid alternating movement normal - Gait: Stride: mostly uses wheelchair and rolator. Does not have he walker with her.  Labs   Lab Results  Component Value Date   NA 129 (L) 01/03/2020   K 3.9 01/03/2020   CL 91 (L) 01/03/2020   CO2 27 01/03/2020   GLUCOSE 95 01/03/2020   BUN 9 01/03/2020   CREATININE 0.69 01/03/2020   CALCIUM 9.0 01/03/2020   ALBUMIN 3.3 (L) 01/03/2020   AST 31 01/03/2020   ALT 38 01/03/2020   ALKPHOS 70 01/03/2020   BILITOT 0.6 01/03/2020   GFRNONAA >60 01/03/2020   GFRAA >60 01/03/2020     Imaging and Diagnostic studies  CTH was negative for a large hypodensity concerning for a large territory infarct or hyperdensity concerning for an ICH  Impression   DEMARIE UHLIG is a 60 y.o. 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 She has had 3 similar episodes. The episode lasts a few hours and involve agitation for a couple hours, it feels like she is losing her memory of some of the events but does remember other events during the episode. This goes on for a couple hours/ Patient describes this as sleep walking. No post ictal confusion althou she feels like sleeping off  her episode helps. The description of the event is clinically less concerning for a seizure. I am not entirely sure what these episodes are thou.Important to rule out frontal lobe dysfunction or enhancing brain lesion given her hx of Neurosarcoidosis. Will get MRI Brain and a routine EEG.  Recommendations  - MRI Brain with and without contrast to assess for any enhancement - routine EEG to look for any epileptogenic abnormalities - Further decision on AEDs pending workup above. ______________________________________________________________________   Thank you for the opportunity to take part in the care of this patient. If you have any further questions, please contact the neurology consultation attending.  Signed,  [place] Pager Number [phone]

## 2020-01-07 NOTE — ED Triage Notes (Signed)
Patient arrived with EMS from home reports seizure x1 this evening witnessed by family , alert and oriented at arrival , denies injury , CBG= 130. Mild nausea , hypertensive 160/116 prior to arrival .

## 2020-01-08 ENCOUNTER — Emergency Department (HOSPITAL_COMMUNITY): Payer: BLUE CROSS/BLUE SHIELD

## 2020-01-08 ENCOUNTER — Observation Stay (HOSPITAL_COMMUNITY): Payer: BLUE CROSS/BLUE SHIELD

## 2020-01-08 ENCOUNTER — Encounter (HOSPITAL_COMMUNITY): Payer: Self-pay | Admitting: Internal Medicine

## 2020-01-08 DIAGNOSIS — D8689 Sarcoidosis of other sites: Secondary | ICD-10-CM

## 2020-01-08 DIAGNOSIS — R413 Other amnesia: Secondary | ICD-10-CM | POA: Diagnosis present

## 2020-01-08 DIAGNOSIS — R479 Unspecified speech disturbances: Secondary | ICD-10-CM | POA: Diagnosis not present

## 2020-01-08 DIAGNOSIS — I10 Essential (primary) hypertension: Secondary | ICD-10-CM | POA: Diagnosis not present

## 2020-01-08 DIAGNOSIS — E871 Hypo-osmolality and hyponatremia: Secondary | ICD-10-CM | POA: Insufficient documentation

## 2020-01-08 DIAGNOSIS — R4182 Altered mental status, unspecified: Secondary | ICD-10-CM | POA: Diagnosis not present

## 2020-01-08 DIAGNOSIS — G4733 Obstructive sleep apnea (adult) (pediatric): Secondary | ICD-10-CM

## 2020-01-08 DIAGNOSIS — R7303 Prediabetes: Secondary | ICD-10-CM

## 2020-01-08 LAB — RAPID URINE DRUG SCREEN, HOSP PERFORMED
Amphetamines: NOT DETECTED
Barbiturates: NOT DETECTED
Benzodiazepines: NOT DETECTED
Cocaine: NOT DETECTED
Opiates: NOT DETECTED
Tetrahydrocannabinol: NOT DETECTED

## 2020-01-08 LAB — CBC
HCT: 38.8 % (ref 36.0–46.0)
Hemoglobin: 12.1 g/dL (ref 12.0–15.0)
MCH: 27.8 pg (ref 26.0–34.0)
MCHC: 31.2 g/dL (ref 30.0–36.0)
MCV: 89.2 fL (ref 80.0–100.0)
Platelets: 212 10*3/uL (ref 150–400)
RBC: 4.35 MIL/uL (ref 3.87–5.11)
RDW: 17.3 % — ABNORMAL HIGH (ref 11.5–15.5)
WBC: 13.6 10*3/uL — ABNORMAL HIGH (ref 4.0–10.5)
nRBC: 0 % (ref 0.0–0.2)

## 2020-01-08 LAB — URINALYSIS, ROUTINE W REFLEX MICROSCOPIC
Bilirubin Urine: NEGATIVE
Glucose, UA: NEGATIVE mg/dL
Ketones, ur: NEGATIVE mg/dL
Nitrite: NEGATIVE
Protein, ur: NEGATIVE mg/dL
Specific Gravity, Urine: 1.006 (ref 1.005–1.030)
pH: 8 (ref 5.0–8.0)

## 2020-01-08 LAB — DIFFERENTIAL
Abs Immature Granulocytes: 0 10*3/uL (ref 0.00–0.07)
Basophils Absolute: 0 10*3/uL (ref 0.0–0.1)
Basophils Relative: 0 %
Eosinophils Absolute: 0 10*3/uL (ref 0.0–0.5)
Eosinophils Relative: 0 %
Lymphocytes Relative: 7 %
Lymphs Abs: 1 10*3/uL (ref 0.7–4.0)
Monocytes Absolute: 0.9 10*3/uL (ref 0.1–1.0)
Monocytes Relative: 6 %
Neutro Abs: 13 10*3/uL — ABNORMAL HIGH (ref 1.7–7.7)
Neutrophils Relative %: 87 %
nRBC: 0 /100 WBC

## 2020-01-08 LAB — CREATININE, SERUM
Creatinine, Ser: 0.64 mg/dL (ref 0.44–1.00)
GFR calc Af Amer: >60 mL/min (ref >60)
GFR calc non Af Amer: >60 mL/min (ref >60)

## 2020-01-08 LAB — GLUCOSE, CAPILLARY
Glucose-Capillary: 132 mg/dL — ABNORMAL HIGH (ref 70–99)
Glucose-Capillary: 168 mg/dL — ABNORMAL HIGH (ref 70–99)

## 2020-01-08 LAB — CBG MONITORING, ED
Glucose-Capillary: 104 mg/dL — ABNORMAL HIGH (ref 70–99)
Glucose-Capillary: 152 mg/dL — ABNORMAL HIGH (ref 70–99)

## 2020-01-08 LAB — SARS CORONAVIRUS 2 BY RT PCR (HOSPITAL ORDER, PERFORMED IN ~~LOC~~ HOSPITAL LAB): SARS Coronavirus 2: NEGATIVE

## 2020-01-08 LAB — TSH: TSH: 0.884 u[IU]/mL (ref 0.350–4.500)

## 2020-01-08 MED ORDER — ONDANSETRON HCL 4 MG PO TABS: 4.0000 mg | ORAL_TABLET | Freq: Four times a day (QID) | ORAL | Status: DC | PRN

## 2020-01-08 MED ORDER — LISINOPRIL 5 MG PO TABS
5.0000 mg | ORAL_TABLET | Freq: Every day | ORAL | Status: DC
  Administered 2020-01-08 – 2020-01-09 (×2): 5 mg via ORAL
  Filled 2020-01-08 (×2): qty 1

## 2020-01-08 MED ORDER — ENOXAPARIN SODIUM 40 MG/0.4ML ~~LOC~~ SOLN
40.0000 mg | SUBCUTANEOUS | Status: DC
  Administered 2020-01-08: 40 mg via SUBCUTANEOUS
  Filled 2020-01-08: qty 0.4

## 2020-01-08 MED ORDER — LEVETIRACETAM 500 MG PO TABS
500.0000 mg | ORAL_TABLET | Freq: Two times a day (BID) | ORAL | Status: DC
  Administered 2020-01-08: 500 mg via ORAL
  Filled 2020-01-08: qty 1

## 2020-01-08 MED ORDER — SODIUM CHLORIDE 0.9% FLUSH
3.0000 mL | Freq: Two times a day (BID) | INTRAVENOUS | Status: DC
  Administered 2020-01-08 (×2): 3 mL via INTRAVENOUS

## 2020-01-08 MED ORDER — INSULIN ASPART 100 UNIT/ML ~~LOC~~ SOLN
0.0000 [IU] | Freq: Three times a day (TID) | SUBCUTANEOUS | Status: DC
  Administered 2020-01-08: 2 [IU] via SUBCUTANEOUS
  Administered 2020-01-08: 1 [IU] via SUBCUTANEOUS

## 2020-01-08 MED ORDER — PREDNISONE 20 MG PO TABS
40.0000 mg | ORAL_TABLET | Freq: Two times a day (BID) | ORAL | Status: DC
  Administered 2020-01-08 – 2020-01-09 (×3): 40 mg via ORAL
  Filled 2020-01-08 (×3): qty 2

## 2020-01-08 MED ORDER — ACETAMINOPHEN 500 MG PO TABS: 1000.0000 mg | ORAL_TABLET | Freq: Two times a day (BID) | ORAL | Status: DC | PRN

## 2020-01-08 MED ORDER — PANTOPRAZOLE SODIUM 40 MG PO TBEC
40.0000 mg | DELAYED_RELEASE_TABLET | Freq: Every day | ORAL | Status: DC
  Administered 2020-01-08 – 2020-01-09 (×2): 40 mg via ORAL
  Filled 2020-01-08 (×2): qty 1

## 2020-01-08 MED ORDER — AMLODIPINE BESYLATE 5 MG PO TABS
5.0000 mg | ORAL_TABLET | Freq: Every day | ORAL | Status: DC
  Administered 2020-01-08 – 2020-01-09 (×2): 5 mg via ORAL
  Filled 2020-01-08 (×2): qty 1

## 2020-01-08 MED ORDER — ATORVASTATIN CALCIUM 80 MG PO TABS
80.0000 mg | ORAL_TABLET | Freq: Every day | ORAL | Status: DC
  Administered 2020-01-08 – 2020-01-09 (×2): 80 mg via ORAL
  Filled 2020-01-08 (×2): qty 1

## 2020-01-08 MED ORDER — GADOBUTROL 1 MMOL/ML IV SOLN
7.5000 mL | Freq: Once | INTRAVENOUS | Status: AC | PRN
  Administered 2020-01-08: 7.5 mL via INTRAVENOUS

## 2020-01-08 MED ORDER — OXYBUTYNIN CHLORIDE 5 MG PO TABS
5.0000 mg | ORAL_TABLET | Freq: Three times a day (TID) | ORAL | Status: DC
  Administered 2020-01-08 – 2020-01-09 (×4): 5 mg via ORAL
  Filled 2020-01-08 (×4): qty 1

## 2020-01-08 MED ORDER — ONDANSETRON HCL 4 MG/2ML IJ SOLN: 4.0000 mg | Freq: Four times a day (QID) | INTRAMUSCULAR | Status: DC | PRN

## 2020-01-08 NOTE — Evaluation (Signed)
Physical Therapy Evaluation Patient Details Name: Rhonda [name] MRN: [id number] DOB: 05/04/1961 Today's Date: 01/08/2020   History of Present Illness  59 y.o. female with medical history significant of pre-diabetes, HTN, Arthritis, GERD, urinary incontinence, OSA, HLD, neurosarcoidosis who presents with episode of memory changes.  Neurology following, concerns that this is secondary to neurosarcoid/partial seizures.    Clinical Impression  Pt admitted with above diagnosis. PTA pt lived at home with her husband, supervision transfers and w/c for mobility. On eval, she required min/mod assist bed mobility, min sit to stand, and mod assist SPT +2 for safety. She presents with decreased strength, decreased balance, and decreased activity tolerance.Pt will benefit from skilled PT to increase their independence and safety with mobility to allow discharge to the venue listed below.  Pt active with OPPT at time of admission. Husband prefers to continue OPPT services upon d/c. Husband present during session to confirm ability to provide needed level of assist at home.     Follow Up Recommendations Outpatient PT;Supervision/Assistance - 24 hour    Equipment Recommendations  None recommended by PT    Recommendations for Other Services       Precautions / Restrictions Precautions Precautions: Fall      Mobility  Bed Mobility Overal bed mobility: Needs Assistance Bed Mobility: Supine to Sit;Sit to Supine     Supine to sit: Min assist;HOB elevated Sit to supine: HOB elevated;Mod assist;+2 for physical assistance   General bed mobility comments: +rail, increased time; Increased assist for return to supine due to height of stretcher in ED.  Transfers Overall transfer level: Needs assistance Equipment used: 1 person hand held assist Transfers: Sit to/from [employer] Transfers Sit to Stand: +2 safety/equipment;Min assist Stand pivot transfers: Mod assist       General transfer  comment: Pt able to take pivot steps stretcher <> chair.  Ambulation/Gait             General Gait Details: deferred. Wheelchair is primary mobility at home.  Stairs            Wheelchair Mobility    Modified Rankin (Stroke Patients Only)       Balance Overall balance assessment: Needs assistance Sitting-balance support: Feet supported;Bilateral upper extremity supported Sitting balance-[name]: Fair     Standing balance support: Bilateral upper extremity supported;During functional activity Standing balance-[name]: Poor Standing balance comment: reliant on external support                             Pertinent Vitals/Pain Pain Assessment: No/denies pain    Home Living Family/patient expects to be discharged to:: Private residence Living Arrangements: Spouse/significant other Available Help at Discharge: Family;Available 24 hours/day Type of Home: House Home Access: Ramped entrance     Home Layout: Two level;1/2 bath on main level;Bed/bath upstairs Home Equipment: Bedside commode;Wheelchair - [employer] - 2 wheels Additional Comments: bedroom moved to first level recently    Prior Function Level of Independence: Needs assistance   Gait / Transfers Assistance Needed: w/c for primary mobility. Supervision transfers. Active with OPPT where she is working on ambulation with RW.  ADL's / Homemaking Assistance Needed: Husband assists with ADLs        Hand Dominance   Dominant Hand: Right    Extremity/Trunk Assessment   Upper Extremity Assessment Upper Extremity Assessment: Defer to OT evaluation    Lower Extremity Assessment Lower Extremity Assessment: Generalized weakness  Communication   Communication: No difficulties  Cognition Arousal/Alertness: Awake/alert Behavior During Therapy: WFL for tasks assessed/performed Overall Cognitive Status: History of cognitive impairments - at baseline                                  General Comments: Husband present to confirm cognition.      General Comments General comments (skin integrity, edema, etc.): VSS on RA    Exercises     Assessment/Plan    PT Assessment Patient needs continued PT services  PT Problem List Decreased strength;Decreased mobility;Decreased balance;Decreased activity tolerance       PT Treatment Interventions Gait training;DME instruction;Therapeutic exercise;Balance training;Functional mobility training;Therapeutic activities;Patient/family education    PT Goals (Current goals can be found in the Care Plan section)  Acute Rehab PT Goals Patient Stated Goal: home PT Goal Formulation: With patient/family Time For Goal Achievement: 01/22/20 Potential to Achieve Goals: Good    Frequency Min 3X/week   Barriers to discharge        Co-evaluation               AM-PAC PT "6 Clicks" Mobility  Outcome Measure Help needed turning from your back to your side while in a flat bed without using bedrails?: A Little Help needed moving from lying on your back to sitting on the side of a flat bed without using bedrails?: A Little Help needed moving to and from a bed to a chair (including a wheelchair)?: A Little Help needed standing up from a chair using your arms (e.g., wheelchair or bedside chair)?: A Little Help needed to walk in hospital room?: A Lot Help needed climbing 3-5 steps with a railing? : Total 6 Click Score: 15    End of Session Equipment Utilized During Treatment: Gait belt Activity Tolerance: Patient tolerated treatment well Patient left: in bed;with call bell/phone within reach;with family/visitor present Nurse Communication: Mobility status PT Visit Diagnosis: Other abnormalities of gait and mobility (R26.89);Muscle weakness (generalized) (M62.81)    Time: 6761-9509 PT Time Calculation (min) (ACUTE ONLY): 15 min   Charges:   PT Evaluation $PT Eval Moderate Complexity: 1 Mod           Lorrin Goodell, PT  Office # [phone] Pager [phone]   Lorriane Shire 01/08/2020, 2:54 PM

## 2020-01-08 NOTE — Procedures (Signed)
History: 59 year old female with neurosarcoidosis with three episodes of transient memory loss  Sedation: None  Technique: This is a 21 channel routine scalp EEG performed at the bedside with bipolar and monopolar montages arranged in accordance to the international 10/20 system of electrode placement. One channel was dedicated to EKG recording.    Background: The background consists of intermixed alpha and beta activities. There is a well defined posterior dominant rhythm of 8-9 hz that attenuates with eye opening. Sleep is recorded with normal appearing structures.   Photic stimulation: Physiologic driving is not performed  EEG Abnormalities: None  Clinical Interpretation: This normal EEG is recorded in the waking and sleep state. There was no seizure or seizure predisposition recorded on this study. Please note that lack of epileptiform activity on EEG does not preclude the possibility of epilepsy.   Roland Rack, MD [facility] [phone]  If 7pm- 7am, please page neurology on call as listed in [place].

## 2020-01-08 NOTE — ED Notes (Signed)
Pt is incontinent and called out to state she needed to be changed. Pt and bed was cleaned and changed. Purewick placed for urine collection.

## 2020-01-08 NOTE — Progress Notes (Signed)
EEG complete - results pending 

## 2020-01-08 NOTE — H&P (Signed)
History and Physical    NALLELI LARGENT NUU:[id number] DOB: 27-Jan-1961 DOA: 01/07/2020  PCP: Bartholome Bill, MD  Patient coming from: Home  Chief Complaint: Memory changes  HPI: Rhonda [name] is a 59 y.o. female with medical history significant of pre-diabetes, HTN, Arthritis, GERD, urinary incontinence, OSA, HLD, neurosarcoidosis who presents with episode of memory changes.  Patient and husband in room report that patient has had these episodes before and has been treating them as partial seizures since being seen with Keppra.  They note that the episodes start with patient being irritated due to not being able to remember specific things, feeling like she is sleep walking.  When family members speak with her, she can remember things, but often forgets things during the episode.  Sleeping helps with the episodes.  Her husband notes that she is improving from this episode at this time.  These are similar to her episodes when she was diagnosed with neurosarcoidosis.   In the ED, given concern for breakthrough seizure, she was loaded with Keppra and she was somewhat sleepy when I saw her.   As pertains to her neurosarcoidosis, this is biopsy-proven and she has been on prednisone as recently as earlier this month (seen on 8/10 by neurology).  She is also in PT to help with walking.  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  Initial diagnosis was from an MRI brain/T/C spine in June of this year.  Per most recent Neurology note, she is on prednisone 40mg  BID and cellcept (started at this time).  Patient did not report being on this medication.    ED Course: In the ED, initial head CT showed no acute intracranial process.  She was noted to have a low Na of 130, low albumin at 2.8.  WBC elevated at 14.9 with a mild normocytic anemia.  Neurology has seen the patient and per report recommended MRI brain and EEG for further evaluation.  These have been ordered.    Review of Systems:  As per HPI otherwise all other systems reviewed and are negative.  Past Medical History:  Diagnosis Date  . Arthritis   . Degenerative disc disease, lumbar   . GERD (gastroesophageal reflux disease)   . Hypertension   . OSA (obstructive sleep apnea)   . Prediabetes   . Seizures ([place])   . Urinary incontinence     Past Surgical History:  Procedure Laterality Date  . ABDOMINAL HYSTERECTOMY    . BREAST EXCISIONAL BIOPSY Right   . BREAST SURGERY     right breast  . CESAREAN SECTION      Social History  reports that she has never smoked. She has never used smokeless tobacco. She reports that she does not drink alcohol and does not use drugs.  Allergies  Allergen Reactions  . Aspirin Nausea And Vomiting  . Augmentin [Amoxicillin-Pot Clavulanate] Swelling and Other (See Comments)    Hand swelled at IV site    Family History  Problem Relation Age of Onset  . Cancer Mother   . Hypertension Sister   . Diabetes Sister   . Diabetes Maternal Grandmother     Prior to Admission medications   Medication Sig Start Date End Date Taking? Authorizing Provider  acetaminophen (TYLENOL) 650 MG CR tablet Take 1,300 mg by mouth 2 (two) times daily as needed (arthritis pain).    Yes [provider]  amLODipine (NORVASC) 5 MG tablet Take 1 tablet (5 mg total) by mouth  daily. 11/28/19  Yes Darrick Meigs, Marge Duncans, MD  APPLE CIDER VINEGAR PO Take 2 capsules by mouth at bedtime.   Yes [provider]  aspirin EC 81 MG tablet Take 1 tablet (81 mg total) by mouth daily. 05/24/19 05/23/20 Yes Dessa Phi, DO  Cyanocobalamin (VITAMIN B-12 PO) Take 1 tablet by mouth daily.    Yes [provider]  glucosamine-chondroitin 500-400 MG tablet Take 1 tablet by mouth 3 (three) times daily. Patient taking differently: Take 2 tablets by mouth daily.  04/11/15  Yes Funches, Adriana Mccallum, MD  levETIRAcetam (KEPPRA) 500 MG tablet Take 1 tablet (500 mg total) by mouth 2 (two) times daily. 01/03/20  Yes  Maudie Flakes, MD  lisinopril (ZESTRIL) 5 MG tablet Take 5 mg by mouth daily. 12/20/19  Yes [provider]  Multiple Vitamin (MULTIVITAMIN WITH MINERALS) TABS tablet Take 1 tablet by mouth daily.   Yes [provider]  Omega-3 Fatty Acids (FISH OIL) 1000 MG CAPS Take 1,000 mg by mouth at bedtime.   Yes [provider]  oxybutynin (DITROPAN) 5 MG tablet Take 5 mg by mouth 3 (three) times daily. 12/14/19  Yes [provider]  pantoprazole (PROTONIX) 40 MG tablet Take 1 tablet (40 mg total) by mouth daily. 11/28/19  Yes Oswald Hillock, MD  atorvastatin (LIPITOR) 80 MG tablet Take 1 tablet (80 mg total) by mouth daily at 6 PM. Patient taking differently: Take 80 mg by mouth daily.  05/24/19 11/13/19  Dessa Phi, DO  lisinopril (ZESTRIL) 10 MG tablet Take 1 tablet (10 mg total) by mouth daily. Patient not taking: Reported on 01/08/2020 11/28/19 11/27/20  Oswald Hillock, MD  PRESCRIPTION MEDICATION Inhale into the lungs See admin instructions. CPAP- At bedtime    [provider]    Physical Exam: Vitals:   01/07/20 2245 01/07/20 2330 01/08/20 0115 01/08/20 0230  BP:  114/86    Pulse: 71 75 72 73  Resp: 17 18 17 14   SpO2: 100% 100% 100% 100%  Weight:      Height:        Constitutional: NAD, comfortable, lying in bed Eyes: lids and conjunctivae normal, no icterus ENMT: She is without teeth, gums and MM normal and moist Respiratory: Breathing comfortably, no wheezing Cardiovascular: RR, NR, no murmur.  She has 2+ pitting edema on the left leg and 1+ pitting on the right which she notes is new.  Pulses intact in radial and DP arteries Abdomen: +BS, NT, ND Musculoskeletal: no clubbing / cyanosis. No contractures Skin: no rashes, lesions, ulcers on exposed skin Neurologic: CN 2-12 grossly intact. EOMI.  Sensation intact throughout to light touch. Strength 5/5 in all 4.  Finger to nose is normal.  She is able to name items and answer questions.  Thoughts and  speech is garbled at times.  Psychiatric: Alert and oriented x 3. Fatigued  Labs on Admission: I have personally reviewed following labs and imaging studies  CBC: Recent Labs  Lab 01/03/20 0258 01/07/20 2235 01/07/20 2301  WBC 15.2* 14.9*  --   NEUTROABS  --  13.0*  --   HGB 13.3 11.9* 12.6  HCT 42.3 37.7 37.0  MCV 88.7 88.9  --   PLT 257 272  --     Basic Metabolic Panel: Recent Labs  Lab 01/03/20 0258 01/07/20 2235 01/07/20 2301  NA 129* 130* 129*  K 3.9 3.9 4.0  CL 91* 92* 89*  CO2 27 32  --   GLUCOSE 95 108*  104*  BUN 9 10 10   CREATININE 0.69 0.72 0.70  CALCIUM 9.0 9.0  --     GFR: Estimated Creatinine Clearance: 77.7 mL/min (by C-G formula based on SCr of 0.7 mg/dL).  Liver Function Tests: Recent Labs  Lab 01/03/20 0258 01/07/20 2235  AST 31 28  ALT 38 33  ALKPHOS 70 60  BILITOT 0.6 0.4  PROT 6.1* 5.2*  ALBUMIN 3.3* 2.8*    Urine analysis:    Component Value Date/Time   COLORURINE AMBER (A) 01/03/2020 1405   APPEARANCEUR CLOUDY (A) 01/03/2020 1405   APPEARANCEUR Clear 02/10/2018 1639   LABSPEC 1.013 01/03/2020 1405   PHURINE 8.0 01/03/2020 1405   GLUCOSEU NEGATIVE 01/03/2020 1405   HGBUR NEGATIVE 01/03/2020 1405   BILIRUBINUR NEGATIVE 01/03/2020 1405   BILIRUBINUR Negative 02/10/2018 1639   KETONESUR NEGATIVE 01/03/2020 1405   PROTEINUR NEGATIVE 01/03/2020 1405   NITRITE NEGATIVE 01/03/2020 1405   LEUKOCYTESUR LARGE (A) 01/03/2020 1405    Radiological Exams on Admission: CT HEAD WO CONTRAST  Result Date: 01/08/2020 CLINICAL DATA:  Seizure EXAM: CT HEAD WITHOUT CONTRAST TECHNIQUE: Contiguous axial images were obtained from the base of the skull through the vertex without intravenous contrast. COMPARISON:  01/03/2020 none FINDINGS: Brain: Normal anatomic configuration. Mild parenchymal volume loss is commensurate with the patient's age. No abnormal intra or extra-axial mass lesion or fluid collection. No abnormal mass effect or midline shift.  No evidence of acute intracranial hemorrhage or infarct. Ventricular size is normal. Cerebellum unremarkable. Vascular: No asymmetric hyperdense vasculature at the skull base Skull: Intact Sinuses/Orbits: Minimal mucus within the right sphenoid sinus. Remaining paranasal sinuses are clear. Orbits are unremarkable. Other: Mastoid air cells and middle ear cavities are clear. IMPRESSION: 1. No evidence of acute intracranial abnormality. 2. Minimal right sphenoid sinus disease. Electronically Signed   By: Fidela Salisbury MD   On: 01/08/2020 01:39    EKG: Independently reviewed. ST depression in the inferior leads with slurring of the ST segment.  Present in previous EKGs.  No reported chest pain.  Sinus rhythm.   Assessment/Plan Memory changes H/O neurosarcoidosis ? Partial Seizures - Neurology is following - MRI brain, EEG ordered and pending - Will restart prednisone based on last Neurology note and chance of adrenal insufficiency if stopped suddenly - Unclear if patient taking Cell Cept as ordered earlier this month, will not order at this time.  Possibly get Refill information in the AM.   - PT/OT - Further plan based on results of MRI and EEG - Neuro checks - Continue home keppra - Telemetry  Leukocytosis - Chronic at this time, likely related to steroid use - Will check a UA - asymptomatic at this time  LE swelling - TTE from June of this year showed normal EF and only mild LVH, normal diastolic parameters - Possibly related to low albumin - Compression stockings ordered  Mild, asymptomatic hypnatremia - 129 on check, she is on salt tablets at home - Monitor.     Essential hypertension - Continue home amlodipine and lisinopril    Prediabetes - SSI  GERD - Continue home protonix  Urinary Incontinence - Continue home oxybutynin  HLD - Continue home atorvastatin  Arthritis - Tylenol PRN    OSA (obstructive sleep apnea) - CPAP Qhs     DVT prophylaxis: Lovenox  Code  Status:   Full Family Communication:  Husband, [name] at bedside Disposition Plan:   Patient is from:  Home  Anticipated DC to:  Home  Anticipated DC  date:  01/09/20  Anticipated DC barriers: Ability to walk, continued work up  [employer] called:  Neurology Admission status:  Obs, Telemetry   Severity of Illness: The appropriate patient status for this patient is OBSERVATION. Observation status is judged to be reasonable and necessary in order to provide the required intensity of service to ensure the patient's safety. The patient's presenting symptoms, physical exam findings, and initial radiographic and laboratory data in the context of their medical condition is felt to place them at decreased risk for further clinical deterioration. Furthermore, it is anticipated that the patient will be medically stable for discharge from the hospital within 2 midnights of admission. The following factors support the patient status of observation.   " The patient's presenting symptoms include Memory issues. " The physical exam findings include Normal head CT, low Na. " The initial radiographic and laboratory data are Normal Head CT.      Gilles Chiquito MD [facility]  How to contact the [facility] Attending or Consulting provider [place] or covering provider during after hours [place], for this patient?   1. Check the care team in [facility] and look for a) attending/consulting [facility] provider listed and b) the [facility] team listed 2. Log into [email or web address] and use [place]'s universal password to access. If you do not have the password, please contact the hospital operator. 3. Locate the [facility] provider you are looking for under [facility] and page to a number that you can be directly reached. 4. If you still have difficulty reaching the provider, please page the [facility] (Director on Call) for the Hospitalists listed on amion for assistance.  01/08/2020, 2:54 AM

## 2020-01-08 NOTE — Progress Notes (Signed)
59 y.o. female with medical history significant of pre-diabetes, HTN, Arthritis, GERD, urinary incontinence, OSA, HLD, neurosarcoidosis who presents with episode of memory changes.  Neurology following, concerns that this is secondary to neurosarcoid/partial seizures.  Seen and examined at bedside in the ED. No complaints overall.   I have also spoken with her husband.   Vitals are overall stable Not in acute distress, CTAB/L, NSR, Abd is NT, ND.    Memory changes secondary to neurosarcoid/partial seizures -Neurology team is following.  Prednisone has been restarted.  Unclear if patient is on CellCept? But currently for her 2nd covid vaccine shot before starting it.  Documented by neurology on 12/27/19 at [facility] in anticipation to taper off steroids.  Will request pharmacy to look into this.  UDS is negative -Prednisone 40 mg twice daily -PT/OT -MRI brain-unchanged leptomeningeal contrast-enhancement consistent with neurosarcoid -EEG- -Keppra 500 mg twice daily  Hyponatremia-currently on salt tablets Essential hypertension-on Norvasc, lisinopril Leukocytosis-due to steroid use, UA shows bacteriuria but no WBC.  Closely monitor off antibiotics Lower extremity swelling-EF is normal.  Concerned secondary to low albumin.  Compression stocking ordered. Hyperlipidemia-Lipitor 80 mg daily  Husband updated by me.   Time spent: 15 mins  Gerlean Ren MD [facility]

## 2020-01-08 NOTE — Progress Notes (Signed)
Occupational Therapy Evaluation Patient Details Name: Rhonda [name] MRN: [id number] DOB: 1961-03-17 Today's Date: 01/08/2020    History of Present Illness 59 y.o. female with medical history significant of pre-diabetes, HTN, Arthritis, GERD, urinary incontinence, OSA, HLD, neurosarcoidosis who presents with episode of memory changes.  Neurology following, concerns that this is secondary to neurosarcoid/partial seizures.   Clinical Impression   Pt presents to OT with the above diagnosis and demonstrates the below listed deficits.  She currently requires set up assist to total A for ADLs.  Mobility was limited due to ED stretcher height and pt's small stature making attempting transfer unsafe without a second person to assist for safety.  She required mod A for bed mobility and min A for EOB sitting.  She lives with her spouse and has very supportive family.  Spouse feels confident he can manage her safely at home, and they have all needed DME.  No further acute OT Recommended.     Follow Up Recommendations  Outpatient OT;Supervision/Assistance - 24 hour    Equipment Recommendations  None recommended by OT    Recommendations for Other Services       Precautions / Restrictions Precautions Precautions: Fall      Mobility Bed Mobility Overal bed mobility: Needs Assistance Bed Mobility: Rolling Rolling: Min guard   Supine to sit: Mod assist Sit to supine: Mod assist   General bed mobility comments: assist to initiate LE movement and to lift trunk and assist to lift LEs onto stretcher   Transfers Overall transfer level: Needs assistance Equipment used: 1 person hand held assist Transfers: Sit to/from Stand;Stand Pivot Transfers Sit to Stand: +2 safety/equipment;Min assist Stand pivot transfers: Mod assist       General transfer comment: unable to safely attempt with +1 assist due to stretcher height and pt's stature     Balance Overall balance assessment: Needs  assistance Sitting-balance support: Feet supported;Single extremity supported Sitting balance-[name]: Poor Sitting balance - Comments: requires UE support and min A  Postural control: Posterior lean Standing balance support: Bilateral upper extremity supported;During functional activity Standing balance-[name]: Poor Standing balance comment: reliant on external support                           ADL either performed or assessed with clinical judgement   ADL Overall ADL's : Needs assistance/impaired Eating/Feeding: Set up;Bed level   Grooming: Wash/dry hands;Wash/dry face;Oral care;Brushing hair;Set up;Bed level   Upper Body Bathing: Minimal assistance;Bed level   Lower Body Bathing: Maximal assistance;Bed level   Upper Body Dressing : Set up;Sitting   Lower Body Dressing: Maximal assistance;Bed level     Toilet Transfer Details (indicate cue type and reason): unable to safely attempt with +1 assist due to height of ED stretcher and pt with short stature and with decreased hip strength            General ADL Comments: pt incontinent of urine and required total A for peri care     Vision Baseline Vision/History: Wears glasses Wears Glasses: At all times Patient Visual Report: No change from baseline       Perception     Praxis      Pertinent Vitals/Pain Pain Assessment: No/denies pain     Hand Dominance Right   Extremity/Trunk Assessment Upper Extremity Assessment Upper Extremity Assessment: Generalized weakness   Lower Extremity Assessment Lower Extremity Assessment: Defer to PT evaluation   Cervical / Trunk Assessment Cervical /  Trunk Assessment: Other exceptions Cervical / Trunk Exceptions: decreased trunk control    Communication Communication Communication: No difficulties   Cognition Arousal/Alertness: Awake/alert Behavior During Therapy: WFL for tasks assessed/performed Overall Cognitive Status: History of cognitive  impairments - at baseline                                 General Comments: Husband present to confirm cognition.   General Comments  VSS.  Spouse reports he feels confident that he can manage pt at home, as he has 2 daughters who are CNA's who can also assist as needed     Exercises     Shoulder Instructions      Home Living Family/patient expects to be discharged to:: Private residence Living Arrangements: Spouse/significant other Available Help at Discharge: Family;Available 24 hours/day Type of Home: House Home Access: Ramped entrance     Home Layout: Two level;1/2 bath on main level;Bed/bath upstairs Alternate Level Stairs-Number of Steps: flight Alternate Level Stairs-Rails: Right Bathroom Shower/Tub: Tub/shower unit;Curtain   Bathroom Toilet: Handicapped height Bathroom Accessibility: Yes   Home Equipment: Bedside commode;Wheelchair - [employer] - 2 wheels   Additional Comments: bedroom moved to first level recently      Prior Functioning/Environment Level of Independence: Needs assistance  Gait / Transfers Assistance Needed: w/c for primary mobility. Supervision transfers. Active with [facility]PP[facility] where she is working on ambulation with RW. ADL's / Homemaking Assistance Needed: pt performs majority of ADLs without assist, but spouse helps as needed             OT Problem List: Decreased strength;Decreased activity tolerance;Impaired balance (sitting and/or standing);Decreased cognition;Decreased knowledge of use of DME or AE;Impaired UE functional use      OT Treatment/Interventions:      OT Goals(Current goals can be found in the care plan section) Acute Rehab OT Goals Patient Stated Goal: to go home and return to OP OT Goal Formulation: With patient/family  OT Frequency:     Barriers to D/C:            Co-evaluation              AM-PAC OT "6 Clicks" Daily Activity     Outcome Measure Help from another person eating meals?:  None Help from another person taking care of personal grooming?: A Little Help from another person toileting, which includes using toliet, bedpan, or urinal?: Total Help from another person bathing (including washing, rinsing, drying)?: A Lot Help from another person to put on and taking off regular upper body clothing?: A Lot Help from another person to put on and taking off regular lower body clothing?: Total 6 Click Score: 13   End of Session    Activity Tolerance: Patient limited by fatigue;Other (comment) (dizziness ) Patient left: in bed;with call bell/phone within reach;with family/visitor present  OT Visit Diagnosis: Muscle weakness (generalized) (M62.81)                Time: 2671-2458 OT Time Calculation (min): 36 min Charges:  OT General Charges $OT Visit: 1 Visit OT Evaluation $OT Eval Moderate Complexity: 1 Mod OT Treatments $Self Care/Home Management : 8-22 mins  Nilsa Nutting., OTR/L Acute Rehabilitation Services Pager [phone] Office [place], [place] 01/08/2020, 3:17 PM

## 2020-01-08 NOTE — ED Notes (Signed)
Pt being transported to MRI.

## 2020-01-08 NOTE — Progress Notes (Signed)
I had a long discussion with her husband regarding the spells.  The episode of repetitive pair that happened 2 weeks ago, which I felt was possibly partial seizures when it was discussed with me back on the 17th, the husband states is possibly not completely outside the realm of normal for her, just seems slightly unusual but everything she was saying made sense at the time, I just seemed like she was unable to be dissuaded from from continuing.  With her current episode, it sounds more like confusion, looking for her bottom teeth which she has not worn in 20 years.  I had a discussion about continuing Keppra versus discontinuing it with the husband.  With a normal EEG, I am not certain we clearly have enough information to justify antiepileptic therapy.  With her back to normal, I am not sure I make adjustments to her current medication regimen other than stopping the Keppra.  Keppra could be making it worse, if this is mild delirium or confusion due to her neurosarcoidosis.  Currently, she is awake, alert, oriented, no focal weakness.  At the current time, I will discontinue her Keppra, as long as she does not have any further spells, she is okay to discharge from my standpoint for follow-up with outpatient neurology.  Please call with any further questions or concerns.  Roland Rack, MD [facility] [phone]  If 7pm- 7am, please page neurology on call as listed in [place].

## 2020-01-09 DIAGNOSIS — R413 Other amnesia: Secondary | ICD-10-CM | POA: Diagnosis not present

## 2020-01-09 DIAGNOSIS — I10 Essential (primary) hypertension: Secondary | ICD-10-CM | POA: Diagnosis not present

## 2020-01-09 DIAGNOSIS — G4733 Obstructive sleep apnea (adult) (pediatric): Secondary | ICD-10-CM | POA: Diagnosis not present

## 2020-01-09 DIAGNOSIS — R7303 Prediabetes: Secondary | ICD-10-CM | POA: Diagnosis not present

## 2020-01-09 LAB — BASIC METABOLIC PANEL
Anion gap: 9 (ref 5–15)
BUN: 9 mg/dL (ref 6–20)
CO2: 26 mmol/L (ref 22–32)
Calcium: 8.7 mg/dL — ABNORMAL LOW (ref 8.9–10.3)
Chloride: 95 mmol/L — ABNORMAL LOW (ref 98–111)
Creatinine, Ser: 0.7 mg/dL (ref 0.44–1.00)
GFR calc Af Amer: >60 mL/min (ref >60)
GFR calc non Af Amer: >60 mL/min (ref >60)
Glucose, Bld: 142 mg/dL — ABNORMAL HIGH (ref 70–99)
Potassium: 4.1 mmol/L (ref 3.5–5.1)
Sodium: 130 mmol/L — ABNORMAL LOW (ref 135–145)

## 2020-01-09 LAB — CBC
HCT: 38.2 % (ref 36.0–46.0)
Hemoglobin: 12.5 g/dL (ref 12.0–15.0)
MCH: 28.9 pg (ref 26.0–34.0)
MCHC: 32.7 g/dL (ref 30.0–36.0)
MCV: 88.4 fL (ref 80.0–100.0)
Platelets: 268 10*3/uL (ref 150–400)
RBC: 4.32 MIL/uL (ref 3.87–5.11)
RDW: 17.2 % — ABNORMAL HIGH (ref 11.5–15.5)
WBC: 15.7 10*3/uL — ABNORMAL HIGH (ref 4.0–10.5)
nRBC: 0 % (ref 0.0–0.2)

## 2020-01-09 LAB — GLUCOSE, CAPILLARY: Glucose-Capillary: 155 mg/dL — ABNORMAL HIGH (ref 70–99)

## 2020-01-09 LAB — MAGNESIUM: Magnesium: 1.7 mg/dL (ref 1.7–2.4)

## 2020-01-09 NOTE — Discharge Summary (Signed)
Physician Discharge Summary  Rhonda [name] WGY:[id number] DOB: [birth year] DOA: 01/07/2020  PCP: Bartholome Bill, MD  Admit date: 01/07/2020 Discharge date: 01/09/2020  Admitted From: Home Disposition:  Home  Recommendations for Outpatient Follow-up:  1. Follow up with PCP in 1-2 weeks 2. Please obtain BMP/CBC in one week your next doctors visit.  3. Follow up with outpatient primary neurology in 1-2 weeks 4. Keppra discontinued by inpatient neurology   Discharge Condition: Stable CODE STATUS: Full code Diet recommendation: Heart healthy  Brief/Interim Summary: 59 year old with history of prediabetes, HTN, arthritis, GERD, urinary incontinence, OSA, neurosarcoidosis presented with memory changes.  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  She underwent routine work-up including MRI brain and EEG which was negative for acute pathology.  Her prednisone 40 mg p.o. was continued.  It was recommended her Keppra be discontinued and this was discussed by neurology with patient and the family. Patient remained stable this morning and stable for discharge with outpatient follow-up recommendations as stated above.  Memory changes secondary to neurosarcoid/partial seizures -Neurology team is following.  Prednisone has been restarted.  Unclear if patient is on CellCept? But currently for her 2nd covid vaccine shot before starting it.  Documented by neurology on 12/27/19 at [facility] in anticipation to taper off steroids.  Will request pharmacy to look into this.  UDS is negative -Prednisone 40 mg twice daily -PT/OT -MRI brain-unchanged leptomeningeal contrast-enhancement consistent with neurosarcoid -EEG-unremarkable -Keppra 500 mg twice daily, discontinued by neurology due to no evidence of seizure activities.  Will need to follow-up outpatient neurology at [facility] upon discharge.  Eventually there is plans to taper off steroids and place her on  CellCept.  Hyponatremia-currently on salt tablets Essential hypertension-on Norvasc, lisinopril Leukocytosis-due to steroid use, UA shows bacteriuria but no WBC.  Closely monitor off antibiotics Lower extremity swelling-EF is normal.  Concerned secondary to low albumin.  Compression stocking ordered. Hyperlipidemia-Lipitor 80 mg daily   Body mass index is 32.02 kg/m.         Discharge Diagnoses:  Active Problems:   Essential hypertension   Prediabetes   OSA (obstructive sleep apnea)   Memory changes      Consultations:  Neurology  Subjective: Patient feels great during my evaluation this morning did not have any complaints.  She wishes to go home today.  Discharge Exam: Vitals:   01/08/20 2103 01/09/20 0813  BP:  109/71  Pulse:  80  Resp: 16 20  Temp:  98 F (36.7 C)  SpO2:  100%   Vitals:   01/08/20 1529 01/08/20 1711 01/08/20 2103 01/09/20 0813  BP:  117/83  109/71  Pulse:  77  80  Resp:  18 16 20   Temp: 99 F (37.2 C) 98.2 F (36.8 C)  98 F (36.7 C)  TempSrc: Oral Oral    SpO2:  98%  100%  Weight:      Height:        General: Pt is alert, awake, not in acute distress Cardiovascular: RRR, S1/S2 +, no rubs, no gallops Respiratory: CTA bilaterally, no wheezing, no rhonchi Abdominal: Soft, NT, ND, bowel sounds + Extremities: no edema, no cyanosis  Discharge Instructions   Allergies as of 01/09/2020      Reactions   Aspirin Nausea And Vomiting   Augmentin [amoxicillin-pot Clavulanate] Swelling, Other (See Comments)   Hand swelled at IV site      Medication List    STOP taking these medications  levETIRAcetam 500 MG tablet Commonly known as: Keppra     TAKE these medications   acetaminophen 650 MG CR tablet Commonly known as: TYLENOL Take 1,300 mg by mouth 2 (two) times daily as needed (arthritis pain).   amLODipine 5 MG tablet Commonly known as: NORVASC Take 1 tablet (5 mg total) by mouth daily.   APPLE CIDER VINEGAR  PO Take 2 capsules by mouth at bedtime.   aspirin EC 81 MG tablet Take 1 tablet (81 mg total) by mouth daily.   atorvastatin 80 MG tablet Commonly known as: LIPITOR Take 1 tablet (80 mg total) by mouth daily at 6 PM. What changed: when to take this   Fish Oil 1000 MG Caps Take 1,000 mg by mouth at bedtime.   glucosamine-chondroitin 500-400 MG tablet Take 1 tablet by mouth 3 (three) times daily. What changed:   how much to take  when to take this   lisinopril 5 MG tablet Commonly known as: ZESTRIL Take 5 mg by mouth daily. What changed: Another medication with the same name was removed. Continue taking this medication, and follow the directions you see here.   multivitamin with minerals Tabs tablet Take 1 tablet by mouth daily.   oxybutynin 5 MG tablet Commonly known as: DITROPAN Take 5 mg by mouth 3 (three) times daily.   pantoprazole 40 MG tablet Commonly known as: PROTONIX Take 1 tablet (40 mg total) by mouth daily.   PRESCRIPTION MEDICATION Inhale into the lungs See admin instructions. CPAP- At bedtime   VITAMIN B-12 PO Take 1 tablet by mouth daily.       Follow-up Information    Bartholome Bill, MD. Schedule an appointment as soon as possible for a visit in 1 week(s).   Specialty: Family Medicine Contact information: [place] [address] [facility]. Go on 01/10/2020.   Why: Please attend your appointment on Tuesday, 01/10/20, at 1:30pm. Contact information: [place], [place] [address] P: [phone]             Allergies  Allergen Reactions  . Aspirin Nausea And Vomiting  . Augmentin [Amoxicillin-Pot Clavulanate] Swelling and Other (See Comments)    Hand swelled at IV site    You were cared for by a hospitalist during your hospital stay. If you have any questions about your discharge medications or the care you received while you were in the  hospital after you are discharged, you can call the unit and asked to speak with the hospitalist on call if the hospitalist that took care of you is not available. Once you are discharged, your primary care physician will handle any further medical issues. Please note that no refills for any discharge medications will be authorized once you are discharged, as it is imperative that you return to your primary care physician (or establish a relationship with a primary care physician if you do not have one) for your aftercare needs so that they can reassess your need for medications and monitor your lab values.   Procedures/Studies: CT HEAD WO CONTRAST  Result Date: 01/08/2020 CLINICAL DATA:  Seizure EXAM: CT HEAD WITHOUT CONTRAST TECHNIQUE: Contiguous axial images were obtained from the base of the skull through the vertex without intravenous contrast. COMPARISON:  01/03/2020 none FINDINGS: Brain: Normal anatomic configuration. Mild parenchymal volume loss is commensurate with the patient's age. No abnormal intra or extra-axial  mass lesion or fluid collection. No abnormal mass effect or midline shift. No evidence of acute intracranial hemorrhage or infarct. Ventricular size is normal. Cerebellum unremarkable. Vascular: No asymmetric hyperdense vasculature at the skull base Skull: Intact Sinuses/Orbits: Minimal mucus within the right sphenoid sinus. Remaining paranasal sinuses are clear. Orbits are unremarkable. Other: Mastoid air cells and middle ear cavities are clear. IMPRESSION: 1. No evidence of acute intracranial abnormality. 2. Minimal right sphenoid sinus disease. Electronically Signed   By: Fidela Salisbury MD   On: 01/08/2020 01:39   CT Head Wo Contrast  Result Date: 01/03/2020 CLINICAL DATA:  Encephalopathy EXAM: CT HEAD WITHOUT CONTRAST TECHNIQUE: Contiguous axial images were obtained from the base of the skull through the vertex without intravenous contrast. COMPARISON:  11/13/2019 FINDINGS: Brain:  There is no mass, hemorrhage or extra-axial collection. The size and configuration of the ventricles and extra-axial CSF spaces are normal. The brain parenchyma is normal, without acute or chronic infarction. Vascular: No abnormal hyperdensity of the major intracranial arteries or dural venous sinuses. No intracranial atherosclerosis. Skull: The visualized skull base, calvarium and extracranial soft tissues are normal. Sinuses/Orbits: No fluid levels or advanced mucosal thickening of the visualized paranasal sinuses. Small amount of right mastoid fluid, unchanged. The orbits are normal. IMPRESSION: Normal head CT. Electronically Signed   By: Ulyses Jarred M.D.   On: 01/03/2020 03:27   MR Brain W and Wo Contrast  Result Date: 01/08/2020 CLINICAL DATA:  Brain mass, neurosarcoidosis EXAM: MRI HEAD WITHOUT AND WITH CONTRAST TECHNIQUE: Multiplanar, multiecho pulse sequences of the brain and surrounding structures were obtained without and with intravenous contrast. CONTRAST:  7.4mL GADAVIST GADOBUTROL 1 MMOL/ML IV SOLN COMPARISON:  11/19/2019 FINDINGS: Brain: There is leptomeningeal contrast enhancement over the cerebellum, brainstem and upper cervical spine. The distribution is unchanged from 11/19/2019 but remains markedly improved compared to 11/14/2019. Multifocal hyperintense T2-weighted signal in the white matter is unchanged. 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 No acute infarct or hemorrhage. Vascular: Normal flow voids Skull and upper cervical spine: Negative Sinuses/Orbits: Right mastoid effusion.  Normal orbits. Other: None IMPRESSION: Unchanged leptomeningeal contrast enhancement over the cerebellum, brainstem and upper cervical spine compared to 11/19/19, consistent with neurosarcoidosis. Electronically Signed   By: Ulyses Jarred M.D.   On: 01/08/2020 05:06   EEG adult  Result Date: 01/08/2020 Greta Doom, MD     01/08/2020  4:49 PM History: 59 year old female with neurosarcoidosis with three episodes  of transient memory loss Sedation: None Technique: This is a 21 channel routine scalp EEG performed at the bedside with bipolar and monopolar montages arranged in accordance to the international 10/20 system of electrode placement. One channel was dedicated to EKG recording. Background: The background consists of intermixed alpha and beta activities. There is a well defined posterior dominant rhythm of 8-9 hz that attenuates with eye opening. Sleep is recorded with normal appearing structures. Photic stimulation: Physiologic driving is not performed EEG Abnormalities: None Clinical Interpretation: This normal EEG is recorded in the waking and sleep state. There was no seizure or seizure predisposition recorded on this study. Please note that lack of epileptiform activity on EEG does not preclude the possibility of epilepsy. Roland Rack, MD [facility] [phone] If 7pm- 7am, please page neurology on call as listed in [place].      The results of significant diagnostics from this hospitalization (including imaging, microbiology, ancillary and laboratory) are listed below for reference.     Microbiology: Recent Results (from the past 240 hour(s))  Urine culture  Status: Abnormal   Collection Time: 01/03/20  3:21 PM   Specimen: Urine, Random  Result Value Ref Range Status   Specimen Description URINE, RANDOM  Final   Special Requests   Final    NONE Performed at [facility], [address], [place], [place] [address]    Culture MULTIPLE SPECIES PRESENT, SUGGEST RECOLLECTION (A)  Final   Report Status 01/06/2020 FINAL  Final  SARS Coronavirus 2 by RT PCR (hospital order, performed in [facility] hospital lab) Nasopharyngeal Nasopharyngeal Swab     Status: None   Collection Time: 01/08/20  2:06 AM   Specimen: Nasopharyngeal Swab  Result Value Ref Range Status   SARS Coronavirus 2 NEGATIVE NEGATIVE Final    Comment: (NOTE) SARS-CoV-2 target nucleic acids are NOT  DETECTED.  The SARS-CoV-2 RNA is generally detectable in upper and lower respiratory specimens during the acute phase of infection. The lowest concentration of SARS-CoV-2 viral copies this assay can detect is 250 copies / mL. A negative result does not preclude SARS-CoV-2 infection and should not be used as the sole basis for treatment or other patient management decisions.  A negative result may occur with improper specimen collection / handling, submission of specimen other than nasopharyngeal swab, presence of viral mutation(s) within the areas targeted by this assay, and inadequate number of viral copies (<250 copies / mL). A negative result must be combined with clinical observations, patient history, and epidemiological information.  Fact Sheet for Patients:   StrictlyIdeas.no  Fact Sheet for Healthcare Providers: BankingDealers.co.za  This test is not yet approved or  cleared by the [place] FDA and has been authorized for detection and/or diagnosis of SARS-CoV-2 by FDA under an Emergency Use Authorization (EUA).  This EUA will remain in effect (meaning this test can be used) for the duration of the COVID-19 declaration under Section 564(b)(1) of the Act, 21 U.S.C. section 360bbb-3(b)(1), unless the authorization is terminated or revoked sooner.  Performed at [facility], [place] [address] [place], [place] [address]      Labs: BNP (last 3 results) No results for input(s): BNP in the last 8760 hours. Basic Metabolic Panel: Recent Labs  Lab 01/03/20 0258 01/07/20 2235 01/07/20 2301 01/08/20 0803 01/09/20 0857  NA 129* 130* 129*  --  130*  K 3.9 3.9 4.0  --  4.1  CL 91* 92* 89*  --  95*  CO2 27 32  --   --  26  GLUCOSE 95 108* 104*  --  142*  BUN 9 10 10   --  9  CREATININE 0.69 0.72 0.70 0.64 0.70  CALCIUM 9.0 9.0  --   --  8.7*  MG  --   --   --   --  1.7   Liver Function Tests: Recent Labs  Lab  01/03/20 0258 01/07/20 2235  AST 31 28  ALT 38 33  ALKPHOS 70 60  BILITOT 0.6 0.4  PROT 6.1* 5.2*  ALBUMIN 3.3* 2.8*   No results for input(s): LIPASE, AMYLASE in the last 168 hours. No results for input(s): AMMONIA in the last 168 hours. CBC: Recent Labs  Lab 01/03/20 0258 01/07/20 2235 01/07/20 2301 01/08/20 0803 01/09/20 0857  WBC 15.2* 14.9*  --  13.6* 15.7*  NEUTROABS  --  13.0*  --   --   --   HGB 13.3 11.9* 12.6 12.1 12.5  HCT 42.3 37.7 37.0 38.8 38.2  MCV 88.7 88.9  --  89.2 88.4  PLT 257 272  --  212 268   Cardiac Enzymes: No results for input(s): CKTOTAL, CKMB, CKMBINDEX, TROPONINI in the last 168 hours. BNP: Invalid input(s): POCBNP CBG: Recent Labs  Lab 01/08/20 0844 01/08/20 1227 01/08/20 1709 01/08/20 2047 01/09/20 0815  GLUCAP 104* 152* 132* 168* 155*   D-Dimer No results for input(s): DDIMER in the last 72 hours. Hgb A1c No results for input(s): HGBA1C in the last 72 hours. Lipid Profile No results for input(s): CHOL, HDL, LDLCALC, TRIG, CHOLHDL, LDLDIRECT in the last 72 hours. Thyroid function studies Recent Labs    01/08/20 0803  TSH 0.884   Anemia work up No results for input(s): VITAMINB12, FOLATE, FERRITIN, TIBC, IRON, RETICCTPCT in the last 72 hours. Urinalysis    Component Value Date/Time   COLORURINE YELLOW 01/08/2020 0551   APPEARANCEUR CLOUDY (A) 01/08/2020 0551   APPEARANCEUR Clear 02/10/2018 1639   LABSPEC 1.006 01/08/2020 0551   PHURINE 8.0 01/08/2020 0551   GLUCOSEU NEGATIVE 01/08/2020 0551   HGBUR LARGE (A) 01/08/2020 0551   BILIRUBINUR NEGATIVE 01/08/2020 0551   BILIRUBINUR Negative 02/10/2018 1639   KETONESUR NEGATIVE 01/08/2020 0551   PROTEINUR NEGATIVE 01/08/2020 0551   NITRITE NEGATIVE 01/08/2020 0551   LEUKOCYTESUR MODERATE (A) 01/08/2020 0551   Sepsis Labs Invalid input(s): PROCALCITONIN,  WBC,  LACTICIDVEN Microbiology Recent Results (from the past 240 hour(s))  Urine culture     Status: Abnormal    Collection Time: 01/03/20  3:21 PM   Specimen: Urine, Random  Result Value Ref Range Status   Specimen Description URINE, RANDOM  Final   Special Requests   Final    NONE Performed at [facility], [address] [place], [place] [address]    Culture MULTIPLE SPECIES PRESENT, SUGGEST RECOLLECTION (A)  Final   Report Status 01/06/2020 FINAL  Final  SARS Coronavirus 2 by RT PCR (hospital order, performed in [facility] hospital lab) Nasopharyngeal Nasopharyngeal Swab     Status: None   Collection Time: 01/08/20  2:06 AM   Specimen: Nasopharyngeal Swab  Result Value Ref Range Status   SARS Coronavirus 2 NEGATIVE NEGATIVE Final    Comment: (NOTE) SARS-CoV-2 target nucleic acids are NOT DETECTED.  The SARS-CoV-2 RNA is generally detectable in upper and lower respiratory specimens during the acute phase of infection. The lowest concentration of SARS-CoV-2 viral copies this assay can detect is 250 copies / mL. A negative result does not preclude SARS-CoV-2 infection and should not be used as the sole basis for treatment or other patient management decisions.  A negative result may occur with improper specimen collection / handling, submission of specimen other than nasopharyngeal swab, presence of viral mutation(s) within the areas targeted by this assay, and inadequate number of viral copies (<250 copies / mL). A negative result must be combined with clinical observations, patient history, and epidemiological information.  Fact Sheet for Patients:   StrictlyIdeas.no  Fact Sheet for Healthcare Providers: BankingDealers.co.za  This test is not yet approved or  cleared by the [place] FDA and has been authorized for detection and/or diagnosis of SARS-CoV-2 by FDA under an Emergency Use Authorization (EUA).  This EUA will remain in effect (meaning this test can be used) for the duration of the COVID-19 declaration under Section  564(b)(1) of the Act, 21 U.S.C. section 360bbb-3(b)(1), unless the authorization is terminated or revoked sooner.  Performed at [facility], [place] [address] [place], [place] [address]      Time coordinating discharge:  I have spent 35 minutes face to face with the patient and on the ward discussing the patients care, assessment, plan and disposition with other care givers. >50% of the time was devoted counseling the patient about the risks and benefits of treatment/Discharge disposition and coordinating care.   SIGNED:   Damita Lack, MD  [facility] 01/09/2020, 11:25 AM   If 7PM-7AM, please contact night-coverage

## 2020-01-09 NOTE — TOC Initial Note (Signed)
Transition of Care [facility]) - Initial/Assessment Note    Patient Details  Name: JEROLYN FLENNIKEN MRN: [id number] Date of Birth: [birth year]  Transition of [facility]) CM/SW Contact:    Lorri Frederick, LCSW Phone Number: 01/09/2020, 9:21 AM  Clinical Narrative:      Pt discharge order in.  CSW met with pt who reports she is discharging today and her husband is on the way.  Pt lives at home with husband and two grandchildren.  Pt reports she has outpt therapy currently in place but could not remember name, near [facility]. Pt reports she has had one covid vaccine shot and is scheduled for her second on 8/30.  Pt reports she has salker, wheelchair, and bedside commode, denies need for any other equipment. Permission given to speak to husband and rehab center.  CSW spoke to husband by phone, he was on his way to pick pt up.    Husband confirms the above, reports he is in home 24 hours/day with pt and also has family support to assist with this.  Reports pt has outpt PT appt tomorrow.  Also reports no need for additional equipment.  CSW contacted [facility] center, [phone], who verified pt is established there with appt scheduled for 130pm tomorrow, 8/24.            Expected Discharge Plan: Home/Self Care Barriers to Discharge: No Barriers Identified   Patient Goals and CMS Choice Patient states their goals for this hospitalization and ongoing recovery are:: get to the best place I can get physically      Expected Discharge Plan and Services Expected Discharge Plan: Home/Self Care     Post Acute Care Choice: Resumption of Svcs/PTA Provider Living arrangements for the past 2 months: Single Family Home Expected Discharge Date: 01/09/20               DME Arranged: N/A         HH Arranged: NA          Prior Living Arrangements/Services Living arrangements for the past 2 months: Single Family Home Lives with:: Minor Children, Spouse Patient language and need for  interpreter reviewed:: Yes Do you feel safe going back to the place where you live?: Yes      Need for Family Participation in Patient Care: Yes (Comment) Care giver support system in place?: Yes (comment)   Criminal Activity/Legal Involvement Pertinent to Current Situation/Hospitalization: No - Comment as needed  Activities of Daily Living      Permission Sought/Granted Permission sought to share information with : Facility Medical sales representative, Family Supports Permission granted to share information with : Yes, Verbal Permission Granted  Share Information with NAME: Jonny Ruiz, husband  Permission granted to share info w AGENCY: outpatient rehab center in [place]        Emotional Assessment Appearance:: Appears stated age Attitude/Demeanor/Rapport: Engaged Affect (typically observed): Pleasant Orientation: : Oriented to Self, Oriented to Place, Oriented to  Time, Oriented to Situation Alcohol / Substance Use: Not Applicable Psych Involvement: No (comment)  Admission diagnosis:  Hyponatremia [E87.1] Memory changes [R41.3] Altered mental status, unspecified altered mental status type [R41.82] Speech disturbance, unspecified type [R47.9] Patient Active Problem List   Diagnosis Date Noted  . Memory changes 01/08/2020  . Altered mental status   . Hyponatremia   . Speech disturbance   . Demyelinating changes in brain (HCC) 11/13/2019  . Acute CVA (cerebrovascular accident) (HCC) 05/23/2019  . Transaminitis 05/23/2019  . Nausea and  vomiting 05/23/2019  . Leukocytosis 05/23/2019  . Thoracic lymphadenopathy 08/26/2018  . Axillary lymphadenopathy 08/26/2018  . Piriformis syndrome of right side 09/29/2016  . OSA (obstructive sleep apnea) 09/27/2015  . Right hip pain 04/11/2015  . Essential hypertension 11/07/2014  . Chronic low back pain 11/07/2014  . Prediabetes 11/07/2014  . Vitamin D deficiency 11/07/2014   PCP:  Bartholome Bill, MD Pharmacy:   [facility] [place] - [address] AT [address] [place] [address] Phone: [phone] Fax: [phone]  [facility] [place], [place] - [address] [place] [address] Phone: [phone] Fax: [phone]     Social Determinants of Health (SDOH) Interventions    Readmission Risk Interventions No flowsheet data found.

## 2020-01-11 ENCOUNTER — Inpatient Hospital Stay (HOSPITAL_COMMUNITY): Payer: BLUE CROSS/BLUE SHIELD

## 2020-01-11 ENCOUNTER — Encounter (HOSPITAL_COMMUNITY): Payer: Self-pay

## 2020-01-11 ENCOUNTER — Other Ambulatory Visit: Payer: Self-pay

## 2020-01-11 ENCOUNTER — Inpatient Hospital Stay (HOSPITAL_COMMUNITY)
Admission: EM | Admit: 2020-01-11 | Discharge: 2020-01-17 | DRG: 196 | Disposition: A | Discharge status: Home / Self Care | Payer: BLUE CROSS/BLUE SHIELD | Attending: Internal Medicine | Admitting: Internal Medicine

## 2020-01-11 DIAGNOSIS — Z8249 Family history of ischemic heart disease and other diseases of the circulatory system: Secondary | ICD-10-CM

## 2020-01-11 DIAGNOSIS — Z79899 Other long term (current) drug therapy: Secondary | ICD-10-CM

## 2020-01-11 DIAGNOSIS — G379 Demyelinating disease of central nervous system, unspecified: Secondary | ICD-10-CM

## 2020-01-11 DIAGNOSIS — R4781 Slurred speech: Secondary | ICD-10-CM | POA: Diagnosis not present

## 2020-01-11 DIAGNOSIS — K5900 Constipation, unspecified: Secondary | ICD-10-CM | POA: Diagnosis not present

## 2020-01-11 DIAGNOSIS — R7303 Prediabetes: Secondary | ICD-10-CM | POA: Diagnosis present

## 2020-01-11 DIAGNOSIS — E875 Hyperkalemia: Secondary | ICD-10-CM | POA: Diagnosis not present

## 2020-01-11 DIAGNOSIS — Z8673 Personal history of transient ischemic attack (TIA), and cerebral infarction without residual deficits: Secondary | ICD-10-CM

## 2020-01-11 DIAGNOSIS — K219 Gastro-esophageal reflux disease without esophagitis: Secondary | ICD-10-CM | POA: Diagnosis present

## 2020-01-11 DIAGNOSIS — I471 Supraventricular tachycardia: Secondary | ICD-10-CM | POA: Diagnosis not present

## 2020-01-11 DIAGNOSIS — Z993 Dependence on wheelchair: Secondary | ICD-10-CM | POA: Diagnosis not present

## 2020-01-11 DIAGNOSIS — R0602 Shortness of breath: Secondary | ICD-10-CM

## 2020-01-11 DIAGNOSIS — Z880 Allergy status to penicillin: Secondary | ICD-10-CM | POA: Diagnosis not present

## 2020-01-11 DIAGNOSIS — G4733 Obstructive sleep apnea (adult) (pediatric): Secondary | ICD-10-CM

## 2020-01-11 DIAGNOSIS — Z6832 Body mass index (BMI) 32.0-32.9, adult: Secondary | ICD-10-CM

## 2020-01-11 DIAGNOSIS — Z7982 Long term (current) use of aspirin: Secondary | ICD-10-CM

## 2020-01-11 DIAGNOSIS — E669 Obesity, unspecified: Secondary | ICD-10-CM | POA: Diagnosis present

## 2020-01-11 DIAGNOSIS — E871 Hypo-osmolality and hyponatremia: Secondary | ICD-10-CM | POA: Diagnosis present

## 2020-01-11 DIAGNOSIS — R9431 Abnormal electrocardiogram [ECG] [EKG]: Secondary | ICD-10-CM | POA: Diagnosis not present

## 2020-01-11 DIAGNOSIS — Z20822 Contact with and (suspected) exposure to covid-19: Secondary | ICD-10-CM | POA: Diagnosis present

## 2020-01-11 DIAGNOSIS — R112 Nausea with vomiting, unspecified: Secondary | ICD-10-CM | POA: Diagnosis present

## 2020-01-11 DIAGNOSIS — G9341 Metabolic encephalopathy: Secondary | ICD-10-CM | POA: Diagnosis present

## 2020-01-11 DIAGNOSIS — I10 Essential (primary) hypertension: Secondary | ICD-10-CM | POA: Diagnosis present

## 2020-01-11 DIAGNOSIS — Z833 Family history of diabetes mellitus: Secondary | ICD-10-CM

## 2020-01-11 DIAGNOSIS — R32 Unspecified urinary incontinence: Secondary | ICD-10-CM | POA: Diagnosis present

## 2020-01-11 DIAGNOSIS — Z886 Allergy status to analgesic agent status: Secondary | ICD-10-CM

## 2020-01-11 DIAGNOSIS — B370 Candidal stomatitis: Secondary | ICD-10-CM | POA: Diagnosis not present

## 2020-01-11 DIAGNOSIS — D8689 Sarcoidosis of other sites: Principal | ICD-10-CM | POA: Diagnosis present

## 2020-01-11 DIAGNOSIS — Z7952 Long term (current) use of systemic steroids: Secondary | ICD-10-CM

## 2020-01-11 DIAGNOSIS — R778 Other specified abnormalities of plasma proteins: Secondary | ICD-10-CM | POA: Diagnosis present

## 2020-01-11 DIAGNOSIS — D72829 Elevated white blood cell count, unspecified: Secondary | ICD-10-CM

## 2020-01-11 DIAGNOSIS — R4182 Altered mental status, unspecified: Secondary | ICD-10-CM

## 2020-01-11 DIAGNOSIS — Z90710 Acquired absence of both cervix and uterus: Secondary | ICD-10-CM

## 2020-01-11 DIAGNOSIS — N390 Urinary tract infection, site not specified: Secondary | ICD-10-CM

## 2020-01-11 DIAGNOSIS — R404 Transient alteration of awareness: Secondary | ICD-10-CM

## 2020-01-11 DIAGNOSIS — R410 Disorientation, unspecified: Secondary | ICD-10-CM | POA: Diagnosis not present

## 2020-01-11 DIAGNOSIS — E785 Hyperlipidemia, unspecified: Secondary | ICD-10-CM

## 2020-01-11 DIAGNOSIS — B9620 Unspecified Escherichia coli [E. coli] as the cause of diseases classified elsewhere: Secondary | ICD-10-CM | POA: Diagnosis present

## 2020-01-11 LAB — CBC WITH DIFFERENTIAL/PLATELET
Abs Immature Granulocytes: 1.21 10*3/uL — ABNORMAL HIGH (ref 0.00–0.07)
Basophils Absolute: 0 10*3/uL (ref 0.0–0.1)
Basophils Relative: 0 %
Eosinophils Absolute: 0 10*3/uL (ref 0.0–0.5)
Eosinophils Relative: 0 %
HCT: 45.3 % (ref 36.0–46.0)
Hemoglobin: 13.4 g/dL (ref 12.0–15.0)
Immature Granulocytes: 8 %
Lymphocytes Relative: 13 %
Lymphs Abs: 1.9 10*3/uL (ref 0.7–4.0)
MCH: 28.4 pg (ref 26.0–34.0)
MCHC: 29.6 g/dL — ABNORMAL LOW (ref 30.0–36.0)
MCV: 96 fL (ref 80.0–100.0)
Monocytes Absolute: 0.7 10*3/uL (ref 0.1–1.0)
Monocytes Relative: 4 %
Neutro Abs: 11.3 10*3/uL — ABNORMAL HIGH (ref 1.7–7.7)
Neutrophils Relative %: 75 %
Platelets: 207 10*3/uL (ref 150–400)
RBC: 4.72 MIL/uL (ref 3.87–5.11)
RDW: 17 % — ABNORMAL HIGH (ref 11.5–15.5)
Smear Review: NORMAL
WBC: 15.2 10*3/uL — ABNORMAL HIGH (ref 4.0–10.5)
nRBC: 0 % (ref 0.0–0.2)

## 2020-01-11 LAB — URINALYSIS, ROUTINE W REFLEX MICROSCOPIC
Bilirubin Urine: NEGATIVE
Glucose, UA: NEGATIVE mg/dL
Ketones, ur: NEGATIVE mg/dL
Nitrite: NEGATIVE
Protein, ur: NEGATIVE mg/dL
Specific Gravity, Urine: 1.006 (ref 1.005–1.030)
pH: 8 (ref 5.0–8.0)

## 2020-01-11 LAB — COMPREHENSIVE METABOLIC PANEL
ALT: UNDETERMINED U/L (ref 0–44)
AST: UNDETERMINED U/L (ref 15–41)
Albumin: 3.1 g/dL — ABNORMAL LOW (ref 3.5–5.0)
Alkaline Phosphatase: 64 U/L (ref 38–126)
Anion gap: 12 (ref 5–15)
BUN: 10 mg/dL (ref 6–20)
CO2: 24 mmol/L (ref 22–32)
Calcium: 8.9 mg/dL (ref 8.9–10.3)
Chloride: 92 mmol/L — ABNORMAL LOW (ref 98–111)
Creatinine, Ser: 0.53 mg/dL (ref 0.44–1.00)
GFR calc Af Amer: >60 mL/min (ref >60)
GFR calc non Af Amer: >60 mL/min (ref >60)
Glucose, Bld: 110 mg/dL — ABNORMAL HIGH (ref 70–99)
Potassium: 4 mmol/L (ref 3.5–5.1)
Sodium: 128 mmol/L — ABNORMAL LOW (ref 135–145)
Total Bilirubin: UNDETERMINED mg/dL (ref 0.3–1.2)
Total Protein: 5.6 g/dL — ABNORMAL LOW (ref 6.5–8.1)

## 2020-01-11 LAB — RAPID URINE DRUG SCREEN, HOSP PERFORMED
Amphetamines: NOT DETECTED
Barbiturates: NOT DETECTED
Benzodiazepines: NOT DETECTED
Cocaine: NOT DETECTED
Opiates: NOT DETECTED
Tetrahydrocannabinol: NOT DETECTED

## 2020-01-11 LAB — ETHANOL: Alcohol, Ethyl (B): <10 mg/dL (ref <10)

## 2020-01-11 LAB — AMMONIA: Ammonia: 13 umol/L (ref 9–35)

## 2020-01-11 LAB — CBG MONITORING, ED: Glucose-Capillary: 120 mg/dL — ABNORMAL HIGH (ref 70–99)

## 2020-01-11 MED ORDER — NITROFURANTOIN MONOHYD MACRO 100 MG PO CAPS
100.0000 mg | ORAL_CAPSULE | Freq: Once | ORAL | Status: DC
  Filled 2020-01-11: qty 1

## 2020-01-11 MED ORDER — OXYBUTYNIN CHLORIDE 5 MG PO TABS
5.0000 mg | ORAL_TABLET | Freq: Three times a day (TID) | ORAL | Status: DC
  Administered 2020-01-12 – 2020-01-17 (×16): 5 mg via ORAL
  Filled 2020-01-11 (×16): qty 1

## 2020-01-11 MED ORDER — ENOXAPARIN SODIUM 40 MG/0.4ML ~~LOC~~ SOLN
40.0000 mg | SUBCUTANEOUS | Status: DC
  Administered 2020-01-11 – 2020-01-16 (×6): 40 mg via SUBCUTANEOUS
  Filled 2020-01-11 (×6): qty 0.4

## 2020-01-11 MED ORDER — SODIUM CHLORIDE 0.9 % IV SOLN: 500.0000 mg | Freq: Every day | INTRAVENOUS | Status: DC

## 2020-01-11 MED ORDER — ONDANSETRON 4 MG PO TBDP
4.0000 mg | ORAL_TABLET | Freq: Once | ORAL | Status: AC
  Administered 2020-01-11: 4 mg via ORAL
  Filled 2020-01-11: qty 1

## 2020-01-11 MED ORDER — ONDANSETRON HCL 4 MG/2ML IJ SOLN
4.0000 mg | Freq: Once | INTRAMUSCULAR | Status: DC
  Filled 2020-01-11: qty 2

## 2020-01-11 MED ORDER — ASPIRIN EC 81 MG PO TBEC
81.0000 mg | DELAYED_RELEASE_TABLET | Freq: Every day | ORAL | Status: DC
  Administered 2020-01-12 – 2020-01-17 (×6): 81 mg via ORAL
  Filled 2020-01-11 (×6): qty 1

## 2020-01-11 MED ORDER — LISINOPRIL 5 MG PO TABS
5.0000 mg | ORAL_TABLET | Freq: Every day | ORAL | Status: DC
  Administered 2020-01-12 – 2020-01-13 (×2): 5 mg via ORAL
  Filled 2020-01-11 (×2): qty 1

## 2020-01-11 MED ORDER — SODIUM CHLORIDE 0.9 % IV SOLN
1.0000 g | INTRAVENOUS | Status: DC
  Administered 2020-01-11 – 2020-01-13 (×3): 1 g via INTRAVENOUS
  Filled 2020-01-11 (×3): qty 10

## 2020-01-11 MED ORDER — ATORVASTATIN CALCIUM 80 MG PO TABS
80.0000 mg | ORAL_TABLET | Freq: Every day | ORAL | Status: DC
  Administered 2020-01-12 – 2020-01-16 (×5): 80 mg via ORAL
  Filled 2020-01-11 (×5): qty 1

## 2020-01-11 MED ORDER — PREDNISONE 20 MG PO TABS
20.0000 mg | ORAL_TABLET | Freq: Two times a day (BID) | ORAL | Status: DC
  Administered 2020-01-12 – 2020-01-13 (×3): 20 mg via ORAL
  Filled 2020-01-11 (×3): qty 1

## 2020-01-11 MED ORDER — SODIUM CHLORIDE 0.9 % IV SOLN
500.0000 mg | Freq: Once | INTRAVENOUS | Status: DC
  Filled 2020-01-11: qty 4

## 2020-01-11 MED ORDER — OMEGA-3-ACID ETHYL ESTERS 1 G PO CAPS
1.0000 g | ORAL_CAPSULE | Freq: Every day | ORAL | Status: DC
  Administered 2020-01-12 – 2020-01-17 (×6): 1 g via ORAL
  Filled 2020-01-11 (×6): qty 1

## 2020-01-11 NOTE — ED Notes (Signed)
Requested assistance from NT and/or phleb for a blood draw.

## 2020-01-11 NOTE — ED Notes (Signed)
Notified EDP of status with IV and blood draw.

## 2020-01-11 NOTE — ED Notes (Signed)
Pt had an episode where she clenched her hands and placed them under her chin. This episode was witnessed by the spouse, this RN and the EDP.

## 2020-01-11 NOTE — ED Provider Notes (Signed)
[facility] EMERGENCY DEPARTMENT Provider Note   CSN: [id number] Arrival date & time: 01/11/20  1046     History Chief Complaint  Patient presents with  . Altered Mental Status    Rhonda [name] is a 59 y.o. female.  59yo F w/ PMH including neurosarcoidosis, OSA, partial seizures, HTN, GERD who p/w AMS. Pt was admitted here 8/21-23 for memory changes. MRI brain and EEG normal, pt taken off Keppra per neurology as no evidence of seizure activity. Remains on prednisone and instructed to f/u with [facility] neurology as eventual plan to take off steroids and start on Cellcept.   EMS states family reports pt was awake around 5am today, bathed and ate, and then around 8:30 they found her slow to respond. They were concerned about possible seizure. BG 150. Pt doesn't recall events from this morning but she does remember being hospitalized and states she has neuro appointment next week. She reports doing ok since hospital discharge, compliant with medications. She feels nauseated and has had some N/V but states this is a common problem for her. She denies headache or focal weakness. She has chronic blurry vision L eye.   The history is provided by the patient, the EMS personnel and a relative.       Past Medical History:  Diagnosis Date  . Arthritis   . Degenerative disc disease, lumbar   . GERD (gastroesophageal reflux disease)   . Hypertension   . OSA (obstructive sleep apnea)   . Prediabetes   . Seizures ([place])   . Urinary incontinence     Patient Active Problem List   Diagnosis Date Noted  . Memory changes 01/08/2020  . Altered mental status   . Hyponatremia   . Speech disturbance   . Demyelinating changes in brain ([place]) 11/13/2019  . Acute CVA (cerebrovascular accident) ([place]) 05/23/2019  . Transaminitis 05/23/2019  . Nausea and vomiting 05/23/2019  . Leukocytosis 05/23/2019  . Thoracic lymphadenopathy 08/26/2018  . Axillary lymphadenopathy 08/26/2018  .  Piriformis syndrome of right side 09/29/2016  . OSA (obstructive sleep apnea) 09/27/2015  . Right hip pain 04/11/2015  . Essential hypertension 11/07/2014  . Chronic low back pain 11/07/2014  . Prediabetes 11/07/2014  . Vitamin D deficiency 11/07/2014    Past Surgical History:  Procedure Laterality Date  . ABDOMINAL HYSTERECTOMY    . BREAST EXCISIONAL BIOPSY Right   . BREAST SURGERY     right breast  . CESAREAN SECTION       OB History    Gravida  5   Para      Term      Preterm      AB  1   Living  4     SAB  1   TAB      Ectopic      Multiple  1   Live Births  4           Family History  Problem Relation Age of Onset  . Cancer Mother   . Hypertension Sister   . Diabetes Sister   . Diabetes Maternal Grandmother     Social History   Tobacco Use  . Smoking status: Never Smoker  . Smokeless tobacco: Never Used  Vaping Use  . Vaping Use: Never used  Substance Use Topics  . Alcohol use: No  . Drug use: No    Home Medications Prior to Admission medications   Medication Sig Start Date End Date Taking? Authorizing Provider  acetaminophen (TYLENOL) 650 MG CR tablet Take 1,300 mg by mouth 2 (two) times daily as needed (arthritis pain).     [provider]  amLODipine (NORVASC) 5 MG tablet Take 1 tablet (5 mg total) by mouth daily. 11/28/19   Oswald Hillock, MD  APPLE CIDER VINEGAR PO Take 2 capsules by mouth at bedtime.    [provider]  aspirin EC 81 MG tablet Take 1 tablet (81 mg total) by mouth daily. 05/24/19 05/23/20  Dessa Phi, DO  atorvastatin (LIPITOR) 80 MG tablet Take 1 tablet (80 mg total) by mouth daily at 6 PM. Patient taking differently: Take 80 mg by mouth daily.  05/24/19 11/13/19  Dessa Phi, DO  Cyanocobalamin (VITAMIN B-12 PO) Take 1 tablet by mouth daily.     [provider]  glucosamine-chondroitin 500-400 MG tablet Take 1 tablet by mouth 3 (three) times daily. Patient taking differently: Take 2  tablets by mouth daily.  04/11/15   Funches, Adriana Mccallum, MD  lisinopril (ZESTRIL) 5 MG tablet Take 5 mg by mouth daily. 12/20/19   [provider]  Multiple Vitamin (MULTIVITAMIN WITH MINERALS) TABS tablet Take 1 tablet by mouth daily.    [provider]  Omega-3 Fatty Acids (FISH OIL) 1000 MG CAPS Take 1,000 mg by mouth at bedtime.    [provider]  oxybutynin (DITROPAN) 5 MG tablet Take 5 mg by mouth 3 (three) times daily. 12/14/19   [provider]  pantoprazole (PROTONIX) 40 MG tablet Take 1 tablet (40 mg total) by mouth daily. 11/28/19   Oswald Hillock, MD  PRESCRIPTION MEDICATION Inhale into the lungs See admin instructions. CPAP- At bedtime    [provider]    Allergies    Aspirin and Augmentin [amoxicillin-pot clavulanate]  Review of Systems   Review of Systems  Unable to perform ROS: Mental status change    Physical Exam Updated Vital Signs BP (!) 150/82 (BP Location: Right Arm)   Pulse 64   Temp 98.4 F (36.9 C) (Oral)   Resp 14   Ht 5\' 3"  (1.6 m)   Wt 82 kg   SpO2 100%   BMI 32.02 kg/m   Physical Exam Vitals and nursing note reviewed.  Constitutional:      General: She is not in acute distress.    Appearance: She is well-developed.     Comments: Awake, alert  HENT:     Head: Normocephalic and atraumatic.  Eyes:     Extraocular Movements: Extraocular movements intact.     Conjunctiva/sclera: Conjunctivae normal.     Pupils: Pupils are equal, round, and reactive to light.  Cardiovascular:     Rate and Rhythm: Normal rate and regular rhythm.     Heart sounds: Normal heart sounds. No murmur heard.   Pulmonary:     Effort: Pulmonary effort is normal. No respiratory distress.     Breath sounds: Normal breath sounds.  Abdominal:     General: Bowel sounds are normal. There is no distension.     Palpations: Abdomen is soft.     Tenderness: There is no abdominal tenderness.  Musculoskeletal:     Cervical back: Neck  supple.  Skin:    General: Skin is warm and dry.  Neurological:     Mental Status: She is alert and oriented to person, place, and time.     Cranial Nerves: No cranial nerve deficit.     Motor: No abnormal muscle tone.     Deep Tendon Reflexes:  Reflexes are normal and symmetric.     Comments: Fluent speech, normal finger-to-nose testing, negative pronator drift, no clonus 5/5 strength and normal sensation x all 4 extremities  Psychiatric:        Mood and Affect: Mood normal.        Behavior: Behavior normal.     ED Results / Procedures / Treatments   Labs (all labs ordered are listed, but only abnormal results are displayed) Labs Reviewed  COMPREHENSIVE METABOLIC PANEL - Abnormal; Notable for the following components:      Result Value   Sodium 128 (*)    Chloride 92 (*)    Glucose, Bld 110 (*)    Total Protein 5.6 (*)    Albumin 3.1 (*)    All other components within normal limits  CBC WITH DIFFERENTIAL/PLATELET - Abnormal; Notable for the following components:   WBC 15.2 (*)    MCHC 29.6 (*)    RDW 17.0 (*)    Neutro Abs 11.3 (*)    Abs Immature Granulocytes 1.21 (*)    All other components within normal limits  CBG MONITORING, ED - Abnormal; Notable for the following components:   Glucose-Capillary 120 (*)    All other components within normal limits  URINE CULTURE  ETHANOL  AMMONIA  URINALYSIS, ROUTINE W REFLEX MICROSCOPIC  RAPID URINE DRUG SCREEN, HOSP PERFORMED    EKG None  Radiology No results found.  Procedures Procedures (including critical care time)  Medications Ordered in ED Medications  ondansetron (ZOFRAN-ODT) disintegrating tablet 4 mg (4 mg Oral Given 01/11/20 1228)    ED Course  I have reviewed the triage vital signs and the nursing notes.  Pertinent labs & imaging results that were available during my care of the patient were reviewed by me and considered in my medical decision making (see chart for details).    MDM  Rules/Calculators/A&P                          Patient was alert and oriented for me, had a nonfocal neurologic exam.  She was able to demonstrate full strength throughout.  It sounds like patient had started the day normally and then had altered awareness transiently, now no longer altered.  I reviewed her chart including 2 recent ED visits for similar complaints and hospitalization from 8/21-8/23. LAbs show stable hyponatremia, otherwise similar to previous.  Given return to baseline, I do not feel she needs repeat head CT as she just had brain MRI showing stable disease.  I was able to speak with her primary neurologist, Dr. Ermalene Postin, over the phone.  I appreciate his assistance with the patient's care.  He advised that she may be having symptoms due to her neurosarcoidosis as a result of lowering her steroids.  He recommended a burst of steroid, 500 mg Solu-Medrol.  We will obtain a UA to ensure no evidence of infection and if reassuring, will administer Solu-Medrol and have patient contact neurology clinic in the morning for further instruction.  Patient signed out pending UA results. Final Clinical Impression(s) / ED Diagnoses Final diagnoses:  None    Rx / DC Orders ED Discharge Orders    None       Sherrick Araki, Wenda Overland, MD 01/11/20 1523

## 2020-01-11 NOTE — ED Notes (Signed)
Pt made aware of the need for urine.

## 2020-01-11 NOTE — ED Triage Notes (Signed)
Pt bib [facility] from home with possible seizure, reported to be A&O x4 but slow to respond. Similar situation for several weeks, last transported on Tues.  Reported to be repeating, "I have covid, I have stroke". Family reported pt up, bathed and eating breakfast around 0500 and then seemed alternated at 0830. V/S stable cbg 150

## 2020-01-11 NOTE — ED Notes (Signed)
Rhonda [name], [phone] daughter would like an  Update

## 2020-01-11 NOTE — Progress Notes (Signed)
vLTM started.  Neurology notified.  Event buttton tested.  Routine EEG 08-22/21

## 2020-01-11 NOTE — ED Notes (Signed)
Attempted to get blood. No success. Informed Phlebotomy.

## 2020-01-11 NOTE — H&P (Signed)
History and Physical    Rhonda [name] XFG:[id number] DOB: September 04, 1960 DOA: 01/11/2020  PCP: Bartholome Bill, MD  Patient coming from: Home  I have personally briefly reviewed patient's old medical records in [place]  Chief Complaint: Altered mental status  HPI: Rhonda [name] is a 59 y.o. female with medical history significant for prediabetes, neurosarcoidosis on chronic steroid,hx of partial seizure,  hypertension, CVA and wheelchair-bound, OSA who presents with concerns of altered mental status.  Patient overall is an unreliable historian.  She says that her husband told her she has been having staring spells and talking to him as if she is "sleepwalking" but she does not recall any of these episodes. No reported limb shaking or incontinence.  She is unable to tell me how long these have been going on but states that they have been worse.  She has been following with [facility] neurology outpatient and recently has been tapering her steroid to transition to CellCept.  She feels like her episodes correlate with the decreasing steroids. She was hospitalized from 01/07/20 to 01/09/20 here at [facility] for memory changes and underwent routine workup including MRI brain and EEG which was negative. Her Keppra was discontinued by neurology.   She denies any headache but notes some blurry vision to her left eye.  Feels lightheaded like she has vertigo when she tries to get up.  She is also been having some nausea and vomiting as well as increased urinary frequency urgency although she is unsure for how long she has had this. No dysuria. Denies any diarrhea.  No fever.  ED physician was able to reach out to her outpatient neurologist and high dose of steroid was recommended with outpatient. However pt later noted to be holding her arms and having repetitive speech so Dr. Cheral Marker on call with neurology was consulted. Stat EEG ordered and hospitalist called for admission.   ED Course: She  was afebrile, normotensive on room air. CBC shows leukocytosis of 15.2 which has been persistently elevated for the past few days and likely due to chronic steroid use. She had hyponatremia of 128 which appears to be chronic for her. UA shows moderate leukocyte, negative nitrite many bacteria and 11-20 WBC. UDS is negative. Checks x-ray negative.  Review of Systems: Constitutional: No Weight Change, No Fever ENT/Mouth: No sore throat, No Rhinorrhea Eyes: No Eye Pain, No Vision Changes Cardiovascular: No Chest Pain, no SOB Respiratory: No Cough, No Sputum Gastrointestinal: No Nausea, No Vomiting, No Diarrhea, No Constipation, No Pain Genitourinary: no Urinary Incontinence Musculoskeletal: No Arthralgias, No Myalgias Skin: No Skin Lesions, No Pruritus, Neuro: no Weakness, No Numbness Psych: No Anxiety/Panic, No Depression, no decrease appetite Heme/Lymph: No Bruising, No Bleeding  Past Medical History:  Diagnosis Date  . Arthritis   . Degenerative disc disease, lumbar   . GERD (gastroesophageal reflux disease)   . Hypertension   . OSA (obstructive sleep apnea)   . Prediabetes   . Seizures ([place])   . Urinary incontinence     Past Surgical History:  Procedure Laterality Date  . ABDOMINAL HYSTERECTOMY    . BREAST EXCISIONAL BIOPSY Right   . BREAST SURGERY     right breast  . CESAREAN SECTION       reports that she has never smoked. She has never used smokeless tobacco. She reports that she does not drink alcohol and does not use drugs. Social History  Allergies  Allergen Reactions  . Aspirin Nausea And Vomiting  .  Augmentin [Amoxicillin-Pot Clavulanate] Swelling and Other (See Comments)    Hand swelled at IV site    Family History  Problem Relation Age of Onset  . Cancer Mother   . Hypertension Sister   . Diabetes Sister   . Diabetes Maternal Grandmother      Prior to Admission medications   Medication Sig Start Date End Date Taking? Authorizing Provider    acetaminophen (TYLENOL) 650 MG CR tablet Take 1,300 mg by mouth 2 (two) times daily as needed (arthritis pain).    Yes [provider]  aspirin EC 81 MG tablet Take 1 tablet (81 mg total) by mouth daily. 05/24/19 05/23/20 Yes Dessa Phi, DO  atorvastatin (LIPITOR) 80 MG tablet Take 1 tablet (80 mg total) by mouth daily at 6 PM. Patient taking differently: Take 80 mg by mouth at bedtime.  05/24/19 01/11/20 Yes Dessa Phi, DO  b complex vitamins tablet Take 1 tablet by mouth daily.   Yes [provider]  Calcium Carb-Cholecalciferol (CALCIUM+D3 PO) Take 1 tablet by mouth daily with lunch.   Yes [provider]  glucosamine-chondroitin 500-400 MG tablet Take 1 tablet by mouth 3 (three) times daily. Patient taking differently: Take 2 tablets by mouth daily with lunch.  04/11/15  Yes Funches, Josalyn, MD  lisinopril (ZESTRIL) 5 MG tablet Take 5 mg by mouth at bedtime.  12/20/19  Yes [provider]  Multiple Vitamin (MULTIVITAMIN WITH MINERALS) TABS tablet Take 1 tablet by mouth daily with lunch.    Yes [provider]  Omega-3 Fatty Acids (FISH OIL) 1000 MG CAPS Take 1,000 mg by mouth daily with lunch.    Yes [provider]  oxybutynin (DITROPAN) 5 MG tablet Take 5 mg by mouth 3 (three) times daily. 12/14/19  Yes [provider]  predniSONE (DELTASONE) 20 MG tablet Take 20 mg by mouth 2 (two) times daily with a meal.  01/06/20  Yes [provider]  [place] into the lungs See admin instructions. CPAP- At bedtime   Yes [provider]  sulfamethoxazole-trimethoprim (BACTRIM DS) 800-160 MG tablet Take 1 tablet by mouth every Monday, Wednesday, and Friday.   Yes [provider]  amLODipine (NORVASC) 5 MG tablet Take 1 tablet (5 mg total) by mouth daily. Patient not taking: Reported on 01/11/2020 11/28/19   Oswald Hillock, MD  APPLE CIDER VINEGAR PO Take 2 capsules by mouth at bedtime. Patient not  taking: Reported on 01/11/2020    [provider]  Cyanocobalamin (VITAMIN B-12 PO) Take 1 tablet by mouth daily.  Patient not taking: Reported on 01/11/2020    [provider]  pantoprazole (PROTONIX) 40 MG tablet Take 1 tablet (40 mg total) by mouth daily. Patient not taking: Reported on 01/11/2020 11/28/19   Oswald Hillock, MD    Physical Exam: Vitals:   01/11/20 1056 01/11/20 1117 01/11/20 1508  BP: (!) 153/83  (!) 150/82  Pulse: 64  64  Resp: 16  14  Temp: 98.4 F (36.9 C)  98.4 F (36.9 C)  TempSrc: Oral  Oral  SpO2: 100%  100%  Weight:  82 kg   Height:  5\' 3"  (1.6 m)     Constitutional: NAD, calm, comfortable, obese female laying flat and stiff appearing in bed Vitals:   01/11/20 1056 01/11/20 1117 01/11/20 1508  BP: (!) 153/83  (!) 150/82  Pulse: 64  64  Resp: 16  14  Temp: 98.4 F (36.9 C)  98.4 F (36.9 C)  TempSrc: Oral  Oral  SpO2: 100%  100%  Weight:  82 kg   Height:  5\' 3"  (1.6 m)    Eyes: PERRL, lids and conjunctivae normal ENMT: Mucous membranes are moist.  Neck: normal, supple,  Respiratory: clear to auscultation bilaterally, no wheezing, no crackles. Normal respiratory effort. No accessory muscle use.  Cardiovascular: Regular rate and rhythm, no murmurs / rubs / gallops. No extremity edema. 2+ pedal pulses.  Abdomen: Mid abdominal tenderness without guarding, rigidity or rebound tenderness, no masses palpated. Bowel sounds positive.  Musculoskeletal: no clubbing / cyanosis. No joint deformity upper and lower extremities. Good ROM, no contractures. Normal muscle tone.  Skin: no rashes, lesions, ulcers. No induration Neurologic: CN 2-12 grossly intact. No nystagmus. Sensation intact, Strength 5/5 in all 4 but unable to lift leg for heel-to-shin and unable to pull herself up in bed without assistance. Psychiatric: Normal judgment and insight. Alert and oriented x 3. Flat affect.     Labs on Admission: I have personally reviewed following  labs and imaging studies  CBC: Recent Labs  Lab 01/07/20 2235 01/07/20 2301 01/08/20 0803 01/09/20 0857 01/11/20 1249  WBC 14.9*  --  13.6* 15.7* 15.2*  NEUTROABS 13.0*  --   --   --  11.3*  HGB 11.9* 12.6 12.1 12.5 13.4  HCT 37.7 37.0 38.8 38.2 45.3  MCV 88.9  --  89.2 88.4 96.0  PLT 272  --  212 268 062   Basic Metabolic Panel: Recent Labs  Lab 01/07/20 2235 01/07/20 2301 01/08/20 0803 01/09/20 0857 01/11/20 1249  NA 130* 129*  --  130* 128*  K 3.9 4.0  --  4.1 4.0  CL 92* 89*  --  95* 92*  CO2 32  --   --  26 24  GLUCOSE 108* 104*  --  142* 110*  BUN 10 10  --  9 10  CREATININE 0.72 0.70 0.64 0.70 0.53  CALCIUM 9.0  --   --  8.7* 8.9  MG  --   --   --  1.7  --    GFR: Estimated Creatinine Clearance: 77.7 mL/min (by C-G formula based on SCr of 0.53 mg/dL). Liver Function Tests: Recent Labs  Lab 01/07/20 2235 01/11/20 1249  AST 28 QUANTITY NOT SUFFICIENT, UNABLE TO PERFORM TEST  ALT 33 QUANTITY NOT SUFFICIENT, UNABLE TO PERFORM TEST  ALKPHOS 60 64  BILITOT 0.4 QUANTITY NOT SUFFICIENT, UNABLE TO PERFORM TEST  PROT 5.2* 5.6*  ALBUMIN 2.8* 3.1*   No results for input(s): LIPASE, AMYLASE in the last 168 hours. Recent Labs  Lab 01/11/20 1249  AMMONIA 13   Coagulation Profile: Recent Labs  Lab 01/07/20 2235  INR 1.0   Cardiac Enzymes: No results for input(s): CKTOTAL, CKMB, CKMBINDEX, TROPONINI in the last 168 hours. BNP (last 3 results) No results for input(s): PROBNP in the last 8760 hours. HbA1C: No results for input(s): HGBA1C in the last 72 hours. CBG: Recent Labs  Lab 01/08/20 1227 01/08/20 1709 01/08/20 2047 01/09/20 0815 01/11/20 1051  GLUCAP 152* 132* 168* 155* 120*   Lipid Profile: No results for input(s): CHOL, HDL, LDLCALC, TRIG, CHOLHDL, LDLDIRECT in the last 72 hours. Thyroid Function Tests: No results for input(s): TSH, T4TOTAL, FREET4, T3FREE, THYROIDAB in the last 72 hours. Anemia Panel: No results for input(s): VITAMINB12,  FOLATE, FERRITIN, TIBC, IRON, RETICCTPCT in the last 72 hours. Urine analysis:    Component Value Date/Time   COLORURINE AMBER (A) 01/11/2020 1507   APPEARANCEUR CLOUDY (  A) 01/11/2020 1507   APPEARANCEUR Clear 02/10/2018 1639   LABSPEC 1.006 01/11/2020 1507   PHURINE 8.0 01/11/2020 1507   GLUCOSEU NEGATIVE 01/11/2020 1507   HGBUR SMALL (A) 01/11/2020 1507   BILIRUBINUR NEGATIVE 01/11/2020 1507   BILIRUBINUR Negative 02/10/2018 [place] 01/11/2020 1507   PROTEINUR NEGATIVE 01/11/2020 1507   NITRITE NEGATIVE 01/11/2020 1507   LEUKOCYTESUR MODERATE (A) 01/11/2020 1507    Radiological Exams on Admission: No results found.    Assessment/Plan  AMS secondary to neurosarcoidosis versus partial seizure Neurology is following. Recommendation is for IV Solu-Medrol 1 g for 5 days.  However patient noted to have symptomatic UTI.  Will await culture to confirm prior to starting steroids. Continue home prednisone No further imaging or anti-epileptics warranted per neurology LTM EEG   UTI UA shows moderate leukocyte, negative nitrite with many bacteria and 11-20 WBC.  She also endorses increased urinary frequency and urgency. Start Rocephin.  Follow urine culture.  If this is negative will start high-dose steroid per neurology. Unclear why pt had three times weekly Bactrim on med list  Hyponatremia Stable.  Continue to monitor.  Essential hypertension Continue  lisinopril  Leukocytosis Due to chronic steroid use  Hyperlipidemia Continue statin  OSA Continue CPAP  DVT prophylaxis:.Lovenox Code Status: Full Family Communication: Plan discussed with patient at bedside  disposition Plan: Home with at least 2 midnight stays  Consults called: Neurology Admission status: inpatient  Status is: Inpatient  Remains inpatient appropriate because:Inpatient level of care appropriate due to severity of illness   Dispo: The patient is from: Home               Anticipated d/c is to: Home              Anticipated d/c date is: 2 days              Patient currently is not medically stable to d/c.         Orene Desanctis DO [facility]   If 7PM-7AM, please contact night-coverage [email or web address]   01/11/2020, 8:17 PM

## 2020-01-11 NOTE — ED Provider Notes (Signed)
Blood pressure (!) 150/82, pulse 64, temperature 98.4 F (36.9 C), temperature source Oral, resp. rate 14, height 5\' 3"  (1.6 m), weight 82 kg, SpO2 100 %.  Assuming care from Dr. Rex Kras.  In short, Rhonda [name] is a 59 y.o. female with a chief complaint of Altered Mental Status .  Refer to the original H&P for additional details.  The current plan of care is to f/u on UA and consider high dose steroid vs abx for infection.  Called the patient room with her holding both arms to the center of her chest and having repetitive speech.  She is somewhat difficult to get her attention but her husband is able to call her name and she will make eye contact but continues to repeat that she is praying.  Question partial seizure.  These episodes are preceded by vomiting which also occurred here.  She is maintaining her airway and the episode resolves after less than 1 minute without medication intervention.  Discussed the case with Dr. Cheral Marker on-call with neurology.  He has ordered stat EEG. Plan for 500 mg IV solumedrol x 3 days and admit.   Discussed patient's case with [facility] to request admission. Patient and family (if present) updated with plan. Care transferred to [facility] service.  I reviewed all nursing notes, vitals, pertinent old records, EKGs, labs, imaging (as available).  CRITICAL CARE Performed by: Margette Fast Total critical care time: 35 minutes Critical care time was exclusive of separately billable procedures and treating other patients. Critical care was necessary to treat or prevent imminent or life-threatening deterioration. Critical care was time spent personally by me on the following activities: development of treatment plan with patient and/or surrogate as well as nursing, discussions with consultants, evaluation of patient's response to treatment, examination of patient, obtaining history from patient or surrogate, ordering and performing treatments and interventions, ordering and  review of laboratory studies, ordering and review of radiographic studies, pulse oximetry and re-evaluation of patient's condition.  Nanda Quinton, MD Emergency Medicine    Kendy Haston, [facility], MD 01/16/20 [phone]

## 2020-01-11 NOTE — ED Notes (Signed)
Pt's CBG result was 120. Informed Elizabeth - RN.

## 2020-01-11 NOTE — Consult Note (Signed)
Neurology Consultation  Reason for Consult: Altered mental status Referring Physician: Dr. Osvaldo Angst, ED provider  CC: Altered mental status, neurosarcoidosis  History is obtained from: Chart review  HPI: Rhonda [name] is a 59 y.o. 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 According to the chart review, the patient's family found her to be acting different than her baseline-she was very slow to respond and was not responding appropriately to questions. She has had "spells" which are concerning for seizures given her underlying leptomeningeal enhancement from the neurosarcoidosis, was started on Keppra but due to EEG is being unremarkable and off and on spells of lethargy, Keppra was discontinued with the thought that that might be contributing to her lethargy. According to the chart review, her husband had reported on prior admission on 01/07/2020 that she has these stereotypic episodes where she starts becoming a little irritated or agitated, becomes argumentative and feels like she is losing her memory-has some recollection of those events but some other instances she does not recollect. There is also been report of her sleepwalking where she remembers part of the episodes but does not remember the other part. These episodes last about an hour or 2 and then gradually resolved. She was seen in the ED at one of the [facility] facilities on 01/03/2020 with an episode of confusion and repeating herself that lasted about 4 to 5 hours. This was briefly discussed with the neurology team, thought to be a seizure given her underlying neurosarcoidosis and started on Keppra and discharged home. There is no reported loss of consciousness or limb shaking. No gross aphasia or  dysarthria that the family is noted. She was compliant with her CPAP per the last history obtained by the on-call neurologist. Does not have excessive daytime sleepiness.  She was evaluated in the emergency room by the ED providers. Her outpatient neurologist was reached who said that probably the patient needs more steroids and should be given high-dose IV Solu-Medrol for 3 days and then follow with him in the outpatient clinic but the patient continued to be very somnolent and appeared not back to baseline and was admitted to the hospital for further work-up. Neurology was consulted for further recommendations inpatient.  Of note, she was also diagnosed to have a pontine stroke, has been wheelchair-bound since January at least of 2021. She was unable to provide any reliable history  ROS: Unable to reliably obtain due to her altered mental status  Past Medical History:  Diagnosis Date  . Arthritis   . Degenerative disc disease, lumbar   . GERD (gastroesophageal reflux disease)   . Hypertension   . OSA (obstructive sleep apnea)   . Prediabetes   . Seizures ([place])   . Urinary incontinence     Family History  Problem Relation Age of Onset  . Cancer Mother   . Hypertension Sister   . Diabetes Sister   . Diabetes Maternal Grandmother    Social History:   reports that she has never smoked. She has never used smokeless tobacco. She reports that she does not drink alcohol and does not use drugs.  Medications  Current Facility-Administered Medications:  .  methylPREDNISolone sodium succinate (SOLU-MEDROL) 500 mg in sodium chloride 0.9 % 50 mL IVPB, 500 mg, Intravenous, Once, Long, Wonda Olds, MD  Current Outpatient  Medications:  .  acetaminophen (TYLENOL) 650 MG CR tablet, Take 1,300 mg by mouth 2 (two) times daily as needed (arthritis pain). , Disp: , Rfl:  .  aspirin EC 81 MG tablet, Take 1 tablet (81 mg total) by mouth daily., Disp: 30 tablet, Rfl: 11 .  atorvastatin (LIPITOR) 80 MG  tablet, Take 1 tablet (80 mg total) by mouth daily at 6 PM. (Patient taking differently: Take 80 mg by mouth daily. ), Disp: 30 tablet, Rfl: 2 .  amLODipine (NORVASC) 5 MG tablet, Take 1 tablet (5 mg total) by mouth daily., Disp: , Rfl:  .  APPLE CIDER VINEGAR PO, Take 2 capsules by mouth at bedtime., Disp: , Rfl:  .  Cyanocobalamin (VITAMIN B-12 PO), Take 1 tablet by mouth daily. , Disp: , Rfl:  .  glucosamine-chondroitin 500-400 MG tablet, Take 1 tablet by mouth 3 (three) times daily. (Patient taking differently: Take 2 tablets by mouth daily. ), Disp: 90 tablet, Rfl: 2 .  lisinopril (ZESTRIL) 5 MG tablet, Take 5 mg by mouth daily., Disp: , Rfl:  .  Multiple Vitamin (MULTIVITAMIN WITH MINERALS) TABS tablet, Take 1 tablet by mouth daily., Disp: , Rfl:  .  Omega-3 Fatty Acids (FISH OIL) 1000 MG CAPS, Take 1,000 mg by mouth at bedtime., Disp: , Rfl:  .  oxybutynin (DITROPAN) 5 MG tablet, Take 5 mg by mouth 3 (three) times daily., Disp: , Rfl:  .  pantoprazole (PROTONIX) 40 MG tablet, Take 1 tablet (40 mg total) by mouth daily., Disp: , Rfl:  .  predniSONE (DELTASONE) 20 MG tablet, Take by mouth., Disp: , Rfl:  .  PRESCRIPTION MEDICATION, Inhale into the lungs See admin instructions. CPAP- At bedtime, Disp: , Rfl:    Exam: Current vital signs: BP (!) 150/82 (BP Location: Right Arm)   Pulse 64   Temp 98.4 F (36.9 C) (Oral)   Resp 14   Ht 5\' 3"  (1.6 m)   Wt 82 kg   SpO2 100%   BMI 32.02 kg/m  Vital signs in last 24 hours: Temp:  [98.4 F (36.9 C)] 98.4 F (36.9 C) (08/25 1508) Pulse Rate:  [64] 64 (08/25 1508) Resp:  [14-16] 14 (08/25 1508) BP: (150-153)/(82-83) 150/82 (08/25 1508) SpO2:  [100 %] 100 % (08/25 1508) Weight:  [82 kg] 82 kg (08/25 1117) General: Patient is sleepy, hooked up to LTM EEG, not in any discomfort HEENT: Normocephalic, atraumatic CVS: Regular rate Respiratory: Breathing normally saturating well on room air Extremities warm well perfused Abdomen  nondistended nontender neurological exam She is awake alert oriented to self. She could not tell me the date today. She knows that she is in the hospital. She was unable to tell me why she was brought to the hospital or who brought her to the hospital. Her speech is not dysarthric She has poor attention concentration Repetition was intact. She is able to follow simple command Cranial nerves: Pupils are equal, Round and reactive to light, extraocular movement exam reveals mildly broken saccadic eye movement but no gross abnormalities, face appears symmetric, facial sensation intact. Motor exam: She is able to raise both arms antigravity. She is also able to raise her legs antigravity at the hip at least 3/5 but does not keep them up for 5 seconds. Bilateral hip strength is 3/5 bilateral knee and ankle 4/5. This is consistent with the last exam. Sensation intact to light touch Coordination difficult to assess due to her mentation  Labs I have reviewed labs in  epic and the results pertinent to this consultation are: Mild leukocytosis-likely secondary to being on steroids. Hyponatremia with sodium of 128. CBC    Component Value Date/Time   WBC 15.2 (H) 01/11/2020 1249   RBC 4.72 01/11/2020 1249   HGB 13.4 01/11/2020 1249   HCT 45.3 01/11/2020 1249   PLT 207 01/11/2020 1249   MCV 96.0 01/11/2020 1249   MCH 28.4 01/11/2020 1249   MCHC 29.6 (L) 01/11/2020 1249   RDW 17.0 (H) 01/11/2020 1249   LYMPHSABS 1.9 01/11/2020 1249   MONOABS 0.7 01/11/2020 1249   EOSABS 0.0 01/11/2020 1249   BASOSABS 0.0 01/11/2020 1249    CMP     Component Value Date/Time   NA 128 (L) 01/11/2020 1249   NA 142 02/10/2018 1639   K 4.0 01/11/2020 1249   CL 92 (L) 01/11/2020 1249   CO2 24 01/11/2020 1249   GLUCOSE 110 (H) 01/11/2020 1249   BUN 10 01/11/2020 1249   BUN 18 02/10/2018 1639   CREATININE 0.53 01/11/2020 1249   CREATININE 0.85 08/02/2015 1710   CALCIUM 8.9 01/11/2020 1249   PROT 5.6 (L)  01/11/2020 1249   PROT 6.9 02/10/2018 1639   ALBUMIN 3.1 (L) 01/11/2020 1249   ALBUMIN 4.0 02/10/2018 1639   AST QUANTITY NOT SUFFICIENT, UNABLE TO PERFORM TEST 01/11/2020 1249   ALT QUANTITY NOT SUFFICIENT, UNABLE TO PERFORM TEST 01/11/2020 1249   ALKPHOS 64 01/11/2020 1249   BILITOT QUANTITY NOT SUFFICIENT, UNABLE TO PERFORM TEST 01/11/2020 1249   BILITOT <0.2 02/10/2018 1639   GFRNONAA >60 01/11/2020 1249   GFRNONAA 78 08/02/2015 1710   GFRAA >60 01/11/2020 1249   GFRAA >89 08/02/2015 1710    Lipid Panel     Component Value Date/Time   CHOL 248 (H) 05/24/2019 0556   CHOL 213 (H) 07/07/2017 0932   TRIG 172 (H) 05/24/2019 0556   HDL 35 (L) 05/24/2019 0556   HDL 40 07/07/2017 0932   CHOLHDL 7.1 05/24/2019 0556   VLDL 34 05/24/2019 0556   LDLCALC 179 (H) 05/24/2019 0556   LDLCALC 137 (H) 07/07/2017 0932   Imaging I have reviewed the images obtained: MRI brain done on 01/08/2020, with or without contrast, reveals unchanged leptomeningeal contrast-enhancement over the cerebellum, brainstem and upper cervical spine compared to the MRI with and without contrast on 11/19/2019 consistent with the diagnosis of neurosarcoidosis.  CSF obtained on 11/14/2019 showed glucose less than 20, 6 red cells, 110 WBCs, 95% lymphocytes, xanthochromic supernatant, total protein of 592.-Again consistent with neurosarcoid.  Assessment: 59 year old with known neurosarcoidosis amongst other comorbidities, presenting with concerns for altered mental status and episodes/spells of varying levels of consciousness without any LOC or seizure-like activity. It is unclear what these episodes are but might be neuropsychological manifestations of her underlying neurosarcoidosis. Outpatient neurology plan was to taper down the steroids and introduce CellCept as a steroid sparing agent. At this time, if she is not showing any signs of UTI and if urinalysis and chest x-ray negative for acute infection, I would recommend  treating her with high-dose Solu-Medrol for 3 to 5 days.  Unclear if the spells are seizures but LTM EEG would be helpful in characterizing some of the spells possibly.  Recommendations: -IV Solu-Medrol 1 g for 5 days -I do not see any need to repeat brain imaging. Exam, other than the fact that she is more encephalopathic is nonfocal for any motor or sensory or cranial nerve findings. -Maintain seizure precautions -LTM is already been suggested by the  on-call neurologist who was consulted over the phone-it is already in place. Follow-up LTM EEG results -I would hold off on using any antiepileptics for now. -Check chest x-ray and urinalysis prior to starting Solu-Medrol. While she is on Solu-Medrol, also ensure that she has GI protection with a PPI.  -Inpatient neurology will follow with you.   Discussed with ED provider Dr. Nanda Quinton, who is the attending of record the patient at the time of this consultation.  -- Amie Portland, MD [facility] Pager: [phone] If 7pm to 7am, please call on call as listed on [facility].

## 2020-01-12 DIAGNOSIS — E785 Hyperlipidemia, unspecified: Secondary | ICD-10-CM

## 2020-01-12 DIAGNOSIS — R410 Disorientation, unspecified: Secondary | ICD-10-CM

## 2020-01-12 DIAGNOSIS — N390 Urinary tract infection, site not specified: Secondary | ICD-10-CM

## 2020-01-12 DIAGNOSIS — I10 Essential (primary) hypertension: Secondary | ICD-10-CM

## 2020-01-12 LAB — BASIC METABOLIC PANEL
Anion gap: 9 (ref 5–15)
BUN: 12 mg/dL (ref 6–20)
CO2: 29 mmol/L (ref 22–32)
Calcium: 8.8 mg/dL — ABNORMAL LOW (ref 8.9–10.3)
Chloride: 92 mmol/L — ABNORMAL LOW (ref 98–111)
Creatinine, Ser: 0.65 mg/dL (ref 0.44–1.00)
GFR calc Af Amer: >60 mL/min (ref >60)
GFR calc non Af Amer: >60 mL/min (ref >60)
Glucose, Bld: 109 mg/dL — ABNORMAL HIGH (ref 70–99)
Potassium: 3.9 mmol/L (ref 3.5–5.1)
Sodium: 130 mmol/L — ABNORMAL LOW (ref 135–145)

## 2020-01-12 LAB — CBC
HCT: 39.7 % (ref 36.0–46.0)
Hemoglobin: 12.9 g/dL (ref 12.0–15.0)
MCH: 28.7 pg (ref 26.0–34.0)
MCHC: 32.5 g/dL (ref 30.0–36.0)
MCV: 88.2 fL (ref 80.0–100.0)
Platelets: 245 10*3/uL (ref 150–400)
RBC: 4.5 MIL/uL (ref 3.87–5.11)
RDW: 17.2 % — ABNORMAL HIGH (ref 11.5–15.5)
WBC: 14.9 10*3/uL — ABNORMAL HIGH (ref 4.0–10.5)
nRBC: 0 % (ref 0.0–0.2)

## 2020-01-12 LAB — SARS CORONAVIRUS 2 BY RT PCR (HOSPITAL ORDER, PERFORMED IN ~~LOC~~ HOSPITAL LAB): SARS Coronavirus 2: NEGATIVE

## 2020-01-12 MED ORDER — SODIUM CHLORIDE 0.9 % IV SOLN: INTRAVENOUS | Status: DC

## 2020-01-12 MED ORDER — NYSTATIN 100000 UNIT/ML MT SUSP
5.0000 mL | Freq: Four times a day (QID) | OROMUCOSAL | Status: DC
  Administered 2020-01-12 – 2020-01-17 (×20): 500000 [IU] via ORAL
  Filled 2020-01-12 (×21): qty 5

## 2020-01-12 NOTE — Procedures (Addendum)
Patient Name: Rhonda [name]  MRN: [id number]  Epilepsy Attending: Lora Havens  Referring Physician/Provider: Dr Kerney Elbe Duration: 01/11/2020 1751 to 01/12/2020 0141 to 01/12/2020 0816 to 01/12/2020 0000  Patient history: 59 year old with known neurosarcoidosis amongst other comorbidities, presenting with concerns for altered mental status and episodes/spells of varying levels of consciousness without any LOC or seizure-like activity. EEG to evaluate for seizure  Level of alertness: Awake, asleep  AEDs during EEG study: None  Technical aspects: This EEG study was done with scalp electrodes positioned according to the 10-20 International system of electrode placement. Electrical activity was acquired at a sampling rate of 500Hz  and reviewed with a high frequency filter of 70Hz  and a low frequency filter of 1Hz . EEG data were recorded continuously and digitally stored.   Description: The posterior dominant rhythm consists of 8 Hz activity of moderate voltage (25-35 uV) seen predominantly in posterior head regions, symmetric and reactive to eye opening and eye closing. Sleep was characterized by vertex waves, sleep spindles (12 to 14 Hz), maximal frontocentral region. Hyperventilation and photic stimulation were not performed.     IMPRESSION: This study is within normal limits. No seizures or epileptiform discharges were seen throughout the recording.  Madolyn Ackroyd Barbra Sarks

## 2020-01-12 NOTE — Progress Notes (Addendum)
PROGRESS NOTE    Rhonda [name]  VEL:[id number] DOB: [birth year] DOA: 01/11/2020 PCP: Bartholome Bill, MD   Brief Narrative: 59 year old-year-old with past medical history significant for prediabetes, neurosarcoidosis on chronic a steroid, history of partial seizure, hypertension, CVA and wheelchair-bound, OSA who presents with concern of altered mental status. Husband report her patient has been having staring spell and talking to him as if she is "sleepwalking" she follows at [facility] neurology as an outpatient and recently has been tapering her steroids to transition to CellCept.  Reported some nausea and vomiting and increased urinary frequency. ED physician discussed case with outpatient neurologist and high-dose steroid was recommended with outpatient follow-up.  Patient continued to be lethargic and not back to baseline and she was admitted for further evaluation.  Neurology has been consulted.   Assessment & Plan:   Principal Problem:   AMS (altered mental status) Active Problems:   Essential hypertension   OSA (obstructive sleep apnea)   Leukocytosis   HLD (hyperlipidemia)   Acute lower UTI  Acute Metabolic encephalopathy;  Neurosarcoidosis vs seizure vs Infection.  Neurology following.  LTM EEG..  Discussed with neurology, Dr Mendel Ryder plan to continue oral prednisone. Hold on starting IV steroids. Plan to reassess tomorrow.   UTI; UA with 11-20 WBC Urine growing gram negative rods.  Continue with ceftriaxone.   Hyponatremia; improved.  Continue with IV fluids.   HTN; Continue with lisinopril.   HLD;  Continue with statins.   Leukocytosis; on steroids. Follow.  Oral thrush; start nystatin.   OSA;   Estimated body mass index is 30.93 kg/m as calculated from the following:   Height as of this encounter: 5\' 3"  (1.6 m).   Weight as of this encounter: 79.2 kg.   DVT prophylaxis: Lovenox Code Status: Full code Family Communication: no family at  bedside Disposition Plan:  Status is: Inpatient  Remains inpatient appropriate because:Ongoing diagnostic testing needed not appropriate for outpatient work up   Dispo: The patient is from: Home              Anticipated d/c is to: Home              Anticipated d/c date is: 2 days              Patient currently is not medically stable to d/c.        Consultants:   Neurology   Procedures:   LTM EEG  Antimicrobials:    Subjective: Alert, oriented to person and place.  She feels better  Objective: Vitals:   01/12/20 0113 01/12/20 0352 01/12/20 0749 01/12/20 1136  BP: 119/83 106/75 110/72 135/80  Pulse: 69 96 79 83  Resp: 17 17 20 20   Temp: 98.3 F (36.8 C) 98.2 F (36.8 C) 98.5 F (36.9 C) 99.1 F (37.3 C)  TempSrc: Oral Oral Oral Oral  SpO2: 100% 100% 100% 100%  Weight: 79.2 kg     Height:        Intake/Output Summary (Last 24 hours) at 01/12/2020 1324 Last data filed at 01/12/2020 0755 Gross per 24 hour  Intake 120 ml  Output 500 ml  Net -380 ml   Filed Weights   01/11/20 1117 01/12/20 0113  Weight: 82 kg 79.2 kg    Examination:  General exam: Appears calm and comfortable  Respiratory system: Clear to auscultation. Respiratory effort normal. Cardiovascular system: S1 & S2 heard, RRR. No JVD, murmurs, rubs, gallops or clicks. No pedal edema. Gastrointestinal system: Abdomen is  nondistended, soft and nontender. No organomegaly or masses felt. Normal bowel sounds heard. Central nervous system: Alert and oriented.   Data Reviewed: I have personally reviewed following labs and imaging studies  CBC: Recent Labs  Lab 01/07/20 2235 01/07/20 2235 01/07/20 2301 01/08/20 0803 01/09/20 0857 01/11/20 1249 01/12/20 0452  WBC 14.9*  --   --  13.6* 15.7* 15.2* 14.9*  NEUTROABS 13.0*  --   --   --   --  11.3*  --   HGB 11.9*   < > 12.6 12.1 12.5 13.4 12.9  HCT 37.7   < > 37.0 38.8 38.2 45.3 39.7  MCV 88.9  --   --  89.2 88.4 96.0 88.2  PLT 272  --    --  212 268 207 245   < > = values in this interval not displayed.   Basic Metabolic Panel: Recent Labs  Lab 01/07/20 2235 01/07/20 2235 01/07/20 2301 01/08/20 0803 01/09/20 0857 01/11/20 1249 01/12/20 0452  NA 130*  --  129*  --  130* 128* 130*  K 3.9  --  4.0  --  4.1 4.0 3.9  CL 92*  --  89*  --  95* 92* 92*  CO2 32  --   --   --  26 24 29   GLUCOSE 108*  --  104*  --  142* 110* 109*  BUN 10  --  10  --  9 10 12   CREATININE 0.72   < > 0.70 0.64 0.70 0.53 0.65  CALCIUM 9.0  --   --   --  8.7* 8.9 8.8*  MG  --   --   --   --  1.7  --   --    < > = values in this interval not displayed.   GFR: Estimated Creatinine Clearance: 76.4 mL/min (by C-G formula based on SCr of 0.65 mg/dL). Liver Function Tests: Recent Labs  Lab 01/07/20 2235 01/11/20 1249  AST 28 QUANTITY NOT SUFFICIENT, UNABLE TO PERFORM TEST  ALT 33 QUANTITY NOT SUFFICIENT, UNABLE TO PERFORM TEST  ALKPHOS 60 64  BILITOT 0.4 QUANTITY NOT SUFFICIENT, UNABLE TO PERFORM TEST  PROT 5.2* 5.6*  ALBUMIN 2.8* 3.1*   No results for input(s): LIPASE, AMYLASE in the last 168 hours. Recent Labs  Lab 01/11/20 1249  AMMONIA 13   Coagulation Profile: Recent Labs  Lab 01/07/20 2235  INR 1.0   Cardiac Enzymes: No results for input(s): CKTOTAL, CKMB, CKMBINDEX, TROPONINI in the last 168 hours. BNP (last 3 results) No results for input(s): PROBNP in the last 8760 hours. HbA1C: No results for input(s): HGBA1C in the last 72 hours. CBG: Recent Labs  Lab 01/08/20 1227 01/08/20 1709 01/08/20 2047 01/09/20 0815 01/11/20 1051  GLUCAP 152* 132* 168* 155* 120*   Lipid Profile: No results for input(s): CHOL, HDL, LDLCALC, TRIG, CHOLHDL, LDLDIRECT in the last 72 hours. Thyroid Function Tests: No results for input(s): TSH, T4TOTAL, FREET4, T3FREE, THYROIDAB in the last 72 hours. Anemia Panel: No results for input(s): VITAMINB12, FOLATE, FERRITIN, TIBC, IRON, RETICCTPCT in the last 72 hours. Sepsis Labs: No results  for input(s): PROCALCITON, LATICACIDVEN in the last 168 hours.  Recent Results (from the past 240 hour(s))  Urine culture     Status: Abnormal   Collection Time: 01/03/20  3:21 PM   Specimen: Urine, Random  Result Value Ref Range Status   Specimen Description URINE, RANDOM  Final   Special Requests   Final    NONE Performed  at [facility], [place] [address], [place], [place] [address]    Culture MULTIPLE SPECIES PRESENT, SUGGEST RECOLLECTION (A)  Final   Report Status 01/06/2020 FINAL  Final  SARS Coronavirus 2 by RT PCR (hospital order, performed in [facility] hospital lab) Nasopharyngeal Nasopharyngeal Swab     Status: None   Collection Time: 01/08/20  2:06 AM   Specimen: Nasopharyngeal Swab  Result Value Ref Range Status   SARS Coronavirus 2 NEGATIVE NEGATIVE Final    Comment: (NOTE) SARS-CoV-2 target nucleic acids are NOT DETECTED.  The SARS-CoV-2 RNA is generally detectable in upper and lower respiratory specimens during the acute phase of infection. The lowest concentration of SARS-CoV-2 viral copies this assay can detect is 250 copies / mL. A negative result does not preclude SARS-CoV-2 infection and should not be used as the sole basis for treatment or other patient management decisions.  A negative result may occur with improper specimen collection / handling, submission of specimen other than nasopharyngeal swab, presence of viral mutation(s) within the areas targeted by this assay, and inadequate number of viral copies (<250 copies / mL). A negative result must be combined with clinical observations, patient history, and epidemiological information.  Fact Sheet for Patients:   StrictlyIdeas.no  Fact Sheet for Healthcare Providers: BankingDealers.co.za  This test is not yet approved or  cleared by the [place] FDA and has been authorized for detection and/or diagnosis of SARS-CoV-2 by FDA under an Emergency  Use Authorization (EUA).  This EUA will remain in effect (meaning this test can be used) for the duration of the COVID-19 declaration under Section 564(b)(1) of the Act, 21 U.S.C. section 360bbb-3(b)(1), unless the authorization is terminated or revoked sooner.  Performed at [facility], [place] [address], [place], [place] [address]   Urine culture     Status: Abnormal (Preliminary result)   Collection Time: 01/11/20  3:07 PM   Specimen: Urine, Random  Result Value Ref Range Status   Specimen Description URINE, RANDOM  Final   Special Requests   Final    NONE Performed at [facility], [place] [address] [place], [place] [address]    Culture >=100,000 COLONIES/mL GRAM NEGATIVE RODS (A)  Final   Report Status PENDING  Incomplete  SARS Coronavirus 2 by RT PCR (hospital order, performed in [facility] hospital lab) Nasopharyngeal Nasopharyngeal Swab     Status: None   Collection Time: 01/11/20 10:35 PM   Specimen: Nasopharyngeal Swab  Result Value Ref Range Status   SARS Coronavirus 2 NEGATIVE NEGATIVE Final    Comment: (NOTE) SARS-CoV-2 target nucleic acids are NOT DETECTED.  The SARS-CoV-2 RNA is generally detectable in upper and lower respiratory specimens during the acute phase of infection. The lowest concentration of SARS-CoV-2 viral copies this assay can detect is 250 copies / mL. A negative result does not preclude SARS-CoV-2 infection and should not be used as the sole basis for treatment or other patient management decisions.  A negative result may occur with improper specimen collection / handling, submission of specimen other than nasopharyngeal swab, presence of viral mutation(s) within the areas targeted by this assay, and inadequate number of viral copies (<250 copies / mL). A negative result must be combined with clinical observations, patient history, and epidemiological information.  Fact Sheet for Patients:    StrictlyIdeas.no  Fact Sheet for Healthcare Providers: BankingDealers.co.za  This test is not yet approved or  cleared by the [place] FDA and has been  authorized for detection and/or diagnosis of SARS-CoV-2 by FDA under an Emergency Use Authorization (EUA).  This EUA will remain in effect (meaning this test can be used) for the duration of the COVID-19 declaration under Section 564(b)(1) of the Act, 21 U.S.C. section 360bbb-3(b)(1), unless the authorization is terminated or revoked sooner.  Performed at [facility], [place] [address], [place], [place] [address]          Radiology Studies: DG Chest 1 View  Result Date: 01/11/2020 CLINICAL DATA:  Altered mental status EXAM: CHEST  1 VIEW COMPARISON:  11/13/2019 FINDINGS: Heart and mediastinal contours are within normal limits. No focal opacities or effusions. No acute bony abnormality. IMPRESSION: No active disease. Electronically Signed   By: Rolm Baptise M.D.   On: 01/11/2020 20:36   Overnight EEG with video  Result Date: 01/12/2020 Lora Havens, MD     01/12/2020 11:09 AM Patient Name: Rhonda [name] MRN: [id number] Epilepsy Attending: Lora Havens Referring Physician/Provider: Dr Kerney Elbe Duration: 01/11/2020 1751 to 01/12/2020 0141 to 01/12/2020 0816 to 01/12/2020 1100 Patient history: 59 year old with known neurosarcoidosis amongst other comorbidities, presenting with concerns for altered mental status and episodes/spells of varying levels of consciousness without any LOC or seizure-like activity. EEG to evaluate for seizure Level of alertness: Awake, asleep AEDs during EEG study: None Technical aspects: This EEG study was done with scalp electrodes positioned according to the 10-20 International system of electrode placement. Electrical activity was acquired at a sampling rate of 500Hz  and reviewed with a high frequency filter of 70Hz  and a low frequency filter of  1Hz . EEG data were recorded continuously and digitally stored. Description: The posterior dominant rhythm consists of 8 Hz activity of moderate voltage (25-35 uV) seen predominantly in posterior head regions, symmetric and reactive to eye opening and eye closing. Sleep was characterized by vertex waves, sleep spindles (12 to 14 Hz), maximal frontocentral region. Hyperventilation and photic stimulation were not performed.   IMPRESSION: This study is within normal limits. No seizures or epileptiform discharges were seen throughout the recording. Priyanka Barbra Sarks        Scheduled Meds: . aspirin EC  81 mg Oral Daily  . atorvastatin  80 mg Oral QHS  . enoxaparin (LOVENOX) injection  40 mg Subcutaneous Q24H  . lisinopril  5 mg Oral QHS  . omega-3 acid ethyl esters  1 g Oral Daily  . oxybutynin  5 mg Oral TID  . predniSONE  20 mg Oral BID WC   Continuous Infusions: . cefTRIAXone (ROCEPHIN)  IV Stopped (01/11/20 2238)     LOS: 1 day    Time spent: 35 minutes,     Carold Eisner A Patrick Salemi, MD [facility]   If 7PM-7AM, please contact night-coverage [email or web address]  01/12/2020, 1:24 PM

## 2020-01-12 NOTE — Progress Notes (Signed)
Pt. Refused cpap. RT informed pt. To notify if she changes her mind. 

## 2020-01-12 NOTE — Progress Notes (Signed)
LTM maint complete - no skin breakdown under:  FP1, FP2

## 2020-01-12 NOTE — Progress Notes (Signed)
Received to room 3W23 from ER via stretcher. Assisted to bed and positioned for comfort. Oriented to room, bed and unit. Bed in low position, bed alarm set, and call light within reach.

## 2020-01-13 ENCOUNTER — Other Ambulatory Visit: Payer: Self-pay

## 2020-01-13 LAB — URINE CULTURE: Culture: >=100000 — AB

## 2020-01-13 LAB — BASIC METABOLIC PANEL
Anion gap: 9 (ref 5–15)
BUN: 8 mg/dL (ref 6–20)
CO2: 28 mmol/L (ref 22–32)
Calcium: 9.1 mg/dL (ref 8.9–10.3)
Chloride: 92 mmol/L — ABNORMAL LOW (ref 98–111)
Creatinine, Ser: 0.58 mg/dL (ref 0.44–1.00)
GFR calc Af Amer: >60 mL/min (ref >60)
GFR calc non Af Amer: >60 mL/min (ref >60)
Glucose, Bld: 158 mg/dL — ABNORMAL HIGH (ref 70–99)
Potassium: 4.3 mmol/L (ref 3.5–5.1)
Sodium: 129 mmol/L — ABNORMAL LOW (ref 135–145)

## 2020-01-13 LAB — CBC
HCT: 41.4 % (ref 36.0–46.0)
Hemoglobin: 13.4 g/dL (ref 12.0–15.0)
MCH: 28.5 pg (ref 26.0–34.0)
MCHC: 32.4 g/dL (ref 30.0–36.0)
MCV: 88.1 fL (ref 80.0–100.0)
Platelets: 244 10*3/uL (ref 150–400)
RBC: 4.7 MIL/uL (ref 3.87–5.11)
RDW: 16.8 % — ABNORMAL HIGH (ref 11.5–15.5)
WBC: 13.8 10*3/uL — ABNORMAL HIGH (ref 4.0–10.5)
nRBC: 0 % (ref 0.0–0.2)

## 2020-01-13 MED ORDER — ENSURE ENLIVE PO LIQD
237.0000 mL | Freq: Two times a day (BID) | ORAL | Status: DC
  Administered 2020-01-14 – 2020-01-16 (×5): 237 mL via ORAL

## 2020-01-13 MED ORDER — PREDNISONE 20 MG PO TABS: 20.0000 mg | ORAL_TABLET | Freq: Two times a day (BID) | ORAL | Status: DC

## 2020-01-13 MED ORDER — PANTOPRAZOLE SODIUM 40 MG IV SOLR
40.0000 mg | INTRAVENOUS | Status: DC
  Administered 2020-01-13: 40 mg via INTRAVENOUS
  Filled 2020-01-13: qty 40

## 2020-01-13 MED ORDER — SODIUM CHLORIDE 0.9 % IV SOLN
1000.0000 mg | Freq: Every day | INTRAVENOUS | Status: AC
  Administered 2020-01-13 – 2020-01-17 (×5): 1000 mg via INTRAVENOUS
  Filled 2020-01-13 (×5): qty 8

## 2020-01-13 NOTE — Procedures (Addendum)
Patient Name: Rhonda [name]  MRN: [id number]  Epilepsy Attending: Lora Havens  Referring Physician/Provider: Dr Kerney Elbe Duration: 01/12/2020 0000 to 01/13/2020 2359  Patient history: 59 year old with known neurosarcoidosis amongst other comorbidities, presenting with concerns for altered mental status and episodes/spells of varying levels of consciousness without any LOC or seizure-like activity. EEG to evaluate for seizure  Level of alertness: Awake, asleep  AEDs during EEG study: None  Technical aspects: This EEG study was done with scalp electrodes positioned according to the 10-20 International system of electrode placement. Electrical activity was acquired at a sampling rate of 500Hz  and reviewed with a high frequency filter of 70Hz  and a low frequency filter of 1Hz . EEG data were recorded continuously and digitally stored.   Description: The posterior dominant rhythm consists of 8 Hz activity of moderate voltage (25-35 uV) seen predominantly in posterior head regions, symmetric and reactive to eye opening and eye closing. Sleep was characterized by vertex waves, sleep spindles (12 to 14 Hz), maximal frontocentral region. Hyperventilation and photic stimulation were not performed.     IMPRESSION: This study is within normal limits. No seizures or epileptiform discharges were seen throughout the recording.  Burney Calzadilla Barbra Sarks

## 2020-01-13 NOTE — Progress Notes (Signed)
LTM maintenance of electrodes completed. Reset as need, FP1, PZ, F8. No skin breakdown noted.

## 2020-01-13 NOTE — Progress Notes (Addendum)
PROGRESS NOTE    Rhonda [name]  WJX:[id number] DOB: Dec 04, 1960 DOA: 01/11/2020 PCP: Bartholome Bill, MD   Brief Narrative: 59 year old-year-old with past medical history significant for prediabetes, neurosarcoidosis on chronic a steroid, history of partial seizure, hypertension, CVA and wheelchair-bound, OSA who presents with concern of altered mental status. Husband report her patient has been having staring spell and talking to him as if she is "sleepwalking" she follows at [facility] neurology as an outpatient and recently has been tapering her steroids to transition to CellCept.  Reported some nausea and vomiting and increased urinary frequency. ED physician discussed case with outpatient neurologist and high-dose steroid was recommended with outpatient follow-up. Patient continued to be lethargic and not back to baseline and she was admitted for further evaluation.  Neurology has been consulted.   Assessment & Plan:   Principal Problem:   AMS (altered mental status) Active Problems:   Essential hypertension   OSA (obstructive sleep apnea)   Leukocytosis   HLD (hyperlipidemia)   Acute lower UTI  Acute Metabolic encephalopathy:  Neurosarcoidosis vs seizure vs Infection.  Neurology following.  LTM EEG.. negative.  Discussed with neurology on 8/26, Dr Mendel Ryder plan to continue oral prednisone. Hold on starting IV steroids.  Neurology will follow on patient today.   UTI; UA with 11-20 WBC Urine growing; E Coli Continue with ceftriaxone.  Afebrile.   Hyponatremia; fluctuates.   HTN; Continue with lisinopril.   HLD;  Continue with statins.   Leukocytosis; on steroids. Follow.  Oral thrush; start nystatin.   OSA;   Estimated body mass index is 30.93 kg/m as calculated from the following:   Height as of this encounter: 5\' 3"  (1.6 m).   Weight as of this encounter: 79.2 kg.   DVT prophylaxis: Lovenox Code Status: Full code Family Communication: no family at  bedside, Husband over phone.  Disposition Plan:  Status is: Inpatient  Remains inpatient appropriate because:Ongoing diagnostic testing needed not appropriate for outpatient work up   Dispo: The patient is from: Home              Anticipated d/c is to: Home              Anticipated d/c date is: 2 days              Patient currently is not medically stable to d/c.        Consultants:   Neurology   Procedures:   LTM EEG  Antimicrobials:    Subjective: Alert, conversant. Follow command. She is feeling better.   Objective: Vitals:   01/12/20 2355 01/13/20 0342 01/13/20 0755 01/13/20 1140  BP: 112/79 127/85 119/87 127/82  Pulse: 73 83 97 84  Resp: 18 18 18 20   Temp: 98.7 F (37.1 C) 98 F (36.7 C) 98.2 F (36.8 C) 97.9 F (36.6 C)  TempSrc: Oral Oral Oral Oral  SpO2: 100% 100% 100% 100%  Weight:      Height:        Intake/Output Summary (Last 24 hours) at 01/13/2020 1347 Last data filed at 01/13/2020 1139 Gross per 24 hour  Intake 741.95 ml  Output 3750 ml  Net -3008.05 ml   Filed Weights   01/11/20 1117 01/12/20 0113  Weight: 82 kg 79.2 kg    Examination:  General exam: NAD Respiratory system: CTA Cardiovascular system: S 1, S 2 RRR Gastrointestinal system: BS present, soft, nt Central nervous system: alert, follow command  Data Reviewed: I have personally reviewed following labs  and imaging studies  CBC: Recent Labs  Lab 01/07/20 2235 01/07/20 2301 01/08/20 0803 01/09/20 0857 01/11/20 1249 01/12/20 0452 01/13/20 0807  WBC 14.9*   < > 13.6* 15.7* 15.2* 14.9* 13.8*  NEUTROABS 13.0*  --   --   --  11.3*  --   --   HGB 11.9*   < > 12.1 12.5 13.4 12.9 13.4  HCT 37.7   < > 38.8 38.2 45.3 39.7 41.4  MCV 88.9   < > 89.2 88.4 96.0 88.2 88.1  PLT 272   < > 212 268 207 245 244   < > = values in this interval not displayed.   Basic Metabolic Panel: Recent Labs  Lab 01/07/20 2235 01/07/20 2235 01/07/20 2301 01/07/20 2301 01/08/20 0803  01/09/20 0857 01/11/20 1249 01/12/20 0452 01/13/20 0807  NA 130*   < > 129*  --   --  130* 128* 130* 129*  K 3.9   < > 4.0  --   --  4.1 4.0 3.9 4.3  CL 92*   < > 89*  --   --  95* 92* 92* 92*  CO2 32  --   --   --   --  26 24 29 28   GLUCOSE 108*   < > 104*  --   --  142* 110* 109* 158*  BUN 10   < > 10  --   --  9 10 12 8   CREATININE 0.72   < > 0.70   < > 0.64 0.70 0.53 0.65 0.58  CALCIUM 9.0  --   --   --   --  8.7* 8.9 8.8* 9.1  MG  --   --   --   --   --  1.7  --   --   --    < > = values in this interval not displayed.   GFR: Estimated Creatinine Clearance: 76.4 mL/min (by C-G formula based on SCr of 0.58 mg/dL). Liver Function Tests: Recent Labs  Lab 01/07/20 2235 01/11/20 1249  AST 28 QUANTITY NOT SUFFICIENT, UNABLE TO PERFORM TEST  ALT 33 QUANTITY NOT SUFFICIENT, UNABLE TO PERFORM TEST  ALKPHOS 60 64  BILITOT 0.4 QUANTITY NOT SUFFICIENT, UNABLE TO PERFORM TEST  PROT 5.2* 5.6*  ALBUMIN 2.8* 3.1*   No results for input(s): LIPASE, AMYLASE in the last 168 hours. Recent Labs  Lab 01/11/20 1249  AMMONIA 13   Coagulation Profile: Recent Labs  Lab 01/07/20 2235  INR 1.0   Cardiac Enzymes: No results for input(s): CKTOTAL, CKMB, CKMBINDEX, TROPONINI in the last 168 hours. BNP (last 3 results) No results for input(s): PROBNP in the last 8760 hours. HbA1C: No results for input(s): HGBA1C in the last 72 hours. CBG: Recent Labs  Lab 01/08/20 1227 01/08/20 1709 01/08/20 2047 01/09/20 0815 01/11/20 1051  GLUCAP 152* 132* 168* 155* 120*   Lipid Profile: No results for input(s): CHOL, HDL, LDLCALC, TRIG, CHOLHDL, LDLDIRECT in the last 72 hours. Thyroid Function Tests: No results for input(s): TSH, T4TOTAL, FREET4, T3FREE, THYROIDAB in the last 72 hours. Anemia Panel: No results for input(s): VITAMINB12, FOLATE, FERRITIN, TIBC, IRON, RETICCTPCT in the last 72 hours. Sepsis Labs: No results for input(s): PROCALCITON, LATICACIDVEN in the last 168 hours.  Recent  Results (from the past 240 hour(s))  Urine culture     Status: Abnormal   Collection Time: 01/03/20  3:21 PM   Specimen: Urine, Random  Result Value Ref Range Status   Specimen Description  URINE, RANDOM  Final   Special Requests   Final    NONE Performed at [facility], [place] [address], [place], [place] [address]    Culture MULTIPLE SPECIES PRESENT, SUGGEST RECOLLECTION (A)  Final   Report Status 01/06/2020 FINAL  Final  SARS Coronavirus 2 by RT PCR (hospital order, performed in [facility] hospital lab) Nasopharyngeal Nasopharyngeal Swab     Status: None   Collection Time: 01/08/20  2:06 AM   Specimen: Nasopharyngeal Swab  Result Value Ref Range Status   SARS Coronavirus 2 NEGATIVE NEGATIVE Final    Comment: (NOTE) SARS-CoV-2 target nucleic acids are NOT DETECTED.  The SARS-CoV-2 RNA is generally detectable in upper and lower respiratory specimens during the acute phase of infection. The lowest concentration of SARS-CoV-2 viral copies this assay can detect is 250 copies / mL. A negative result does not preclude SARS-CoV-2 infection and should not be used as the sole basis for treatment or other patient management decisions.  A negative result may occur with improper specimen collection / handling, submission of specimen other than nasopharyngeal swab, presence of viral mutation(s) within the areas targeted by this assay, and inadequate number of viral copies (<250 copies / mL). A negative result must be combined with clinical observations, patient history, and epidemiological information.  Fact Sheet for Patients:   StrictlyIdeas.no  Fact Sheet for Healthcare Providers: BankingDealers.co.za  This test is not yet approved or  cleared by the [place] FDA and has been authorized for detection and/or diagnosis of SARS-CoV-2 by FDA under an Emergency Use Authorization (EUA).  This EUA will remain in effect (meaning this  test can be used) for the duration of the COVID-19 declaration under Section 564(b)(1) of the Act, 21 U.S.C. section 360bbb-3(b)(1), unless the authorization is terminated or revoked sooner.  Performed at [facility], [place] [address], [place], [place] [address]   Urine culture     Status: Abnormal (Preliminary result)   Collection Time: 01/11/20  3:07 PM   Specimen: Urine, Random  Result Value Ref Range Status   Specimen Description URINE, RANDOM  Final   Special Requests   Final    NONE Performed at [facility], [place] [address] [place], [place] [address]    Culture >=100,000 COLONIES/mL ESCHERICHIA COLI (A)  Final   Report Status PENDING  Incomplete  SARS Coronavirus 2 by RT PCR (hospital order, performed in [facility] hospital lab) Nasopharyngeal Nasopharyngeal Swab     Status: None   Collection Time: 01/11/20 10:35 PM   Specimen: Nasopharyngeal Swab  Result Value Ref Range Status   SARS Coronavirus 2 NEGATIVE NEGATIVE Final    Comment: (NOTE) SARS-CoV-2 target nucleic acids are NOT DETECTED.  The SARS-CoV-2 RNA is generally detectable in upper and lower respiratory specimens during the acute phase of infection. The lowest concentration of SARS-CoV-2 viral copies this assay can detect is 250 copies / mL. A negative result does not preclude SARS-CoV-2 infection and should not be used as the sole basis for treatment or other patient management decisions.  A negative result may occur with improper specimen collection / handling, submission of specimen other than nasopharyngeal swab, presence of viral mutation(s) within the areas targeted by this assay, and inadequate number of viral copies (<250 copies / mL). A negative result must be combined with clinical observations, patient history, and epidemiological information.  Fact Sheet for Patients:   StrictlyIdeas.no  Fact Sheet for Healthcare  Providers: BankingDealers.co.za  This test  is not yet approved or  cleared by the [place] and has been authorized for detection and/or diagnosis of SARS-CoV-2 by FDA under an Emergency Use Authorization (EUA).  This EUA will remain in effect (meaning this test can be used) for the duration of the COVID-19 declaration under Section 564(b)(1) of the Act, 21 U.S.C. section 360bbb-3(b)(1), unless the authorization is terminated or revoked sooner.  Performed at [facility], [place] [address], [place], [place] [address]          Radiology Studies: DG Chest 1 View  Result Date: 01/11/2020 CLINICAL DATA:  Altered mental status EXAM: CHEST  1 VIEW COMPARISON:  11/13/2019 FINDINGS: Heart and mediastinal contours are within normal limits. No focal opacities or effusions. No acute bony abnormality. IMPRESSION: No active disease. Electronically Signed   By: Rolm Baptise M.D.   On: 01/11/2020 20:36   Overnight EEG with video  Result Date: 01/12/2020 Lora Havens, MD     01/13/2020 10:02 AM Patient Name: Rhonda [name] MRN: [id number] Epilepsy Attending: Lora Havens Referring Physician/Provider: Dr Kerney Elbe Duration: 01/11/2020 1751 to 01/12/2020 0141 to 01/12/2020 0816 to 01/12/2020 0000 Patient history: 59 year old with known neurosarcoidosis amongst other comorbidities, presenting with concerns for altered mental status and episodes/spells of varying levels of consciousness without any LOC or seizure-like activity. EEG to evaluate for seizure Level of alertness: Awake, asleep AEDs during EEG study: None Technical aspects: This EEG study was done with scalp electrodes positioned according to the 10-20 International system of electrode placement. Electrical activity was acquired at a sampling rate of 500Hz  and reviewed with a high frequency filter of 70Hz  and a low frequency filter of 1Hz . EEG data were recorded continuously and digitally stored. Description:  The posterior dominant rhythm consists of 8 Hz activity of moderate voltage (25-35 uV) seen predominantly in posterior head regions, symmetric and reactive to eye opening and eye closing. Sleep was characterized by vertex waves, sleep spindles (12 to 14 Hz), maximal frontocentral region. Hyperventilation and photic stimulation were not performed.   IMPRESSION: This study is within normal limits. No seizures or epileptiform discharges were seen throughout the recording. Priyanka Barbra Sarks        Scheduled Meds: . aspirin EC  81 mg Oral Daily  . atorvastatin  80 mg Oral QHS  . enoxaparin (LOVENOX) injection  40 mg Subcutaneous Q24H  . lisinopril  5 mg Oral QHS  . nystatin  5 mL Oral QID  . omega-3 acid ethyl esters  1 g Oral Daily  . oxybutynin  5 mg Oral TID  . predniSONE  20 mg Oral BID WC   Continuous Infusions: . cefTRIAXone (ROCEPHIN)  IV 1 g (01/12/20 2008)     LOS: 2 days    Time spent: 35 minutes,     Dymphna Wadley A Geniece Akers, MD [facility]   If 7PM-7AM, please contact night-coverage [email or web address]  01/13/2020, 1:47 PM

## 2020-01-13 NOTE — Evaluation (Signed)
Physical Therapy Evaluation Patient Details Name: Rhonda [name] MRN: [id number] DOB: [birth year] Today's Date: 01/13/2020   History of Present Illness  Rhonda [name] is a 59 y.o. female with medical history significant for prediabetes, neurosarcoidosis on chronic steroid,hx of partial seizure,  hypertension, CVA and wheelchair-bound, OSA who presents with concerns of altered mental status. Recently here a week ago.  Clinical Impression  Patient received in bed, EEG on. Per RN EEG to be removed. Patient is agreeable to PT assessment. No confusion at this time. She reports she is mainly bed-bound but can stand and transfer to toilet and chair.  Patient requires min assist with bed mobility, mod assist for sit to stand, min assist for standing pivot to recliner. Heavy use of B UEs during transfer. She will continue to benefit from skilled PT while here to improve strength and functional independence for return home.     Follow Up Recommendations Outpatient PT    Equipment Recommendations  None recommended by PT    Recommendations for Other Services       Precautions / Restrictions Precautions Precautions: Fall Restrictions Weight Bearing Restrictions: No Other Position/Activity Restrictions: Patient with EEG running, RN chatted with MD who states is should have been removed. RN approves evaluation.      Mobility  Bed Mobility Overal bed mobility: Needs Assistance Bed Mobility: Supine to Sit     Supine to sit: Min assist     General bed mobility comments: min assist to bring right LE off bed due to weakness. use of rail to pull herself up to sitting.  Transfers Overall transfer level: Needs assistance Equipment used: None Transfers: Sit to/from [facility] Sit to Stand: Mod assist Stand pivot transfers: Min assist       General transfer comment: patient able to boost up to standing with mod assist. Relies on B UE assist during transfer to recliner from bed.  One hand on window sill, other on recliner.  Ambulation/Gait Ambulation/Gait assistance: Min assist Gait Distance (Feet): 2 Feet     Gait velocity: decr   General Gait Details: two pivoting steps to recliner  Stairs            Wheelchair Mobility    Modified Rankin (Stroke Patients Only)       Balance Overall balance assessment: Needs assistance Sitting-balance support: Feet supported Sitting balance-[name]: Fair Sitting balance - Comments: able to sit without support   Standing balance support: Bilateral upper extremity supported;During functional activity Standing balance-[name]: Poor Standing balance comment: reliant on external support                             Pertinent Vitals/Pain Pain Assessment: No/denies pain    Home Living Family/patient expects to be discharged to:: Private residence Living Arrangements: Spouse/significant other;Children Available Help at Discharge: Family;Available 24 hours/day Type of Home: House Home Access: Ramped entrance     Home Layout: Two level;1/2 bath on main level;Able to live on main level with bedroom/bathroom Home Equipment: Bedside commode;Wheelchair - [employer] - 2 wheels Additional Comments: bedroom moved to first level recently, reports she has been w/c bound but is able to stand and transfer to toilet, chair. She cannot get her wheelchair in the bathroom.    Prior Function Level of Independence: Needs assistance   Gait / Transfers Assistance Needed: w/c for primary mobility. Supervision transfers. Active with OPPT where she is working on ambulation with RW.  ADL's / Homemaking Assistance Needed: pt performs majority of ADLs without assist, but spouse helps as needed         Hand Dominance   Dominant Hand: Right    Extremity/Trunk Assessment   Upper Extremity Assessment Upper Extremity Assessment: Generalized weakness    Lower Extremity Assessment Lower Extremity  Assessment: Generalized weakness       Communication   Communication: No difficulties  Cognition Arousal/Alertness: Awake/alert Behavior During Therapy: WFL for tasks assessed/performed Overall Cognitive Status: Within Functional Limits for tasks assessed                                        General Comments      Exercises     Assessment/Plan    PT Assessment Patient needs continued PT services  PT Problem List Decreased strength;Decreased mobility;Decreased activity tolerance;Decreased balance;Decreased safety awareness       PT Treatment Interventions DME instruction;Therapeutic exercise;Balance training;Functional mobility training;Therapeutic activities;Patient/family education    PT Goals (Current goals can be found in the Care Plan section)  Acute Rehab PT Goals Patient Stated Goal: to return home with family PT Goal Formulation: With patient Time For Goal Achievement: 01/27/20 Potential to Achieve Goals: Good    Frequency Min 3X/week   Barriers to discharge        Co-evaluation               AM-PAC PT "6 Clicks" Mobility  Outcome Measure Help needed turning from your back to your side while in a flat bed without using bedrails?: A Little Help needed moving from lying on your back to sitting on the side of a flat bed without using bedrails?: A Little Help needed moving to and from a bed to a chair (including a wheelchair)?: A Little Help needed standing up from a chair using your arms (e.g., wheelchair or bedside chair)?: A Little Help needed to walk in hospital room?: Total Help needed climbing 3-5 steps with a railing? : Total 6 Click Score: 14    End of Session Equipment Utilized During Treatment: Gait belt Activity Tolerance: Patient tolerated treatment well Patient left: in chair;with chair alarm set;with call bell/phone within reach Nurse Communication: Mobility status PT Visit Diagnosis: Muscle weakness (generalized)  (M62.81);Other abnormalities of gait and mobility (R26.89)    Time: 7616-0737 PT Time Calculation (min) (ACUTE ONLY): 15 min   Charges:   PT Evaluation $PT Eval Moderate Complexity: 1 Mod          Oma Alpert, PT, GCS 01/13/20,3:59 PM

## 2020-01-13 NOTE — Progress Notes (Signed)
Pt states she doesn't want tot wear CPAP tonight. No distress noted

## 2020-01-13 NOTE — Progress Notes (Addendum)
NEUROLOGY PROGRESS NOTE   Subjective: Patient states that she feels much more alert and oriented.  She is able to tell me the date, month, place, room number, and why she is here.  Exam: Vitals:   01/13/20 1140 01/13/20 1619  BP: 127/82 137/83  Pulse: 84 74  Resp: 20 20  Temp: 97.9 F (36.6 C) 98.7 F (37.1 C)  SpO2: 100% 100%   Neuro:  Mental Status: Alert, oriented, thought content appropriate.  Speech fluent without evidence of aphasia.  Able to follow 3 step commands without difficulty. Cranial Nerves: II:  Visual fields grossly normal, PERRL III,IV, VI: ptosis not present, EOMI V,VII: smile symmetric, facial light touch sensation normal bilaterally VIII: hearing normal bilaterally Motor: Right : Upper extremity   5/5    Left:     Upper extremity   5/5  Lower extremity   5/5     Lower extremity   5/5 Tone and bulk:normal tone throughout; no atrophy noted Sensory: Pinprick and light touch intact throughout, bilaterally   Medications:  Scheduled: . aspirin EC  81 mg Oral Daily  . atorvastatin  80 mg Oral QHS  . enoxaparin (LOVENOX) injection  40 mg Subcutaneous Q24H  . lisinopril  5 mg Oral QHS  . nystatin  5 mL Oral QID  . omega-3 acid ethyl esters  1 g Oral Daily  . oxybutynin  5 mg Oral TID  . predniSONE  20 mg Oral BID WC   Continuous: . cefTRIAXone (ROCEPHIN)  IV 1 g (01/12/20 2008)    Pertinent Labs/Diagnostics: CBC has trended down from 15.7-13.8 over 4 days Patient is afebrile  DG Chest 1 View  Result Date: 01/11/2020 CLINICAL DATA:  Altered mental status EXAM: CHEST  1 VIEW COMPARISON:  11/13/2019 FINDINGS: Heart and mediastinal contours are within normal limits. No focal opacities or effusions. No acute bony abnormality. IMPRESSION: No active disease. Electronically Signed   By: Rolm Baptise M.D.   On: 01/11/2020 20:36   Overnight EEG with video  Result Date: 01/12/2020 Lora Havens, MD     01/13/2020 10:02 AM Patient Name: Rhonda [name] MRN:  [id number] Epilepsy Attending: Lora Havens Referring Physician/Provider: Dr Kerney Elbe Duration: 01/11/2020 1751 to 01/12/2020 0141 to 01/12/2020 0816 to 01/12/2020 0000 Patient history: 59 year old with known neurosarcoidosis amongst other comorbidities, presenting with concerns for altered mental status and episodes/spells of varying levels of consciousness without any LOC or seizure-like activity. EEG to evaluate for seizure Level of alertness: Awake, asleep AEDs during EEG study: None Technical aspects: This EEG study was done with scalp electrodes positioned according to the 10-20 International system of electrode placement. Electrical activity was acquired at a sampling rate of 500Hz  and reviewed with a high frequency filter of 70Hz  and a low frequency filter of 1Hz . EEG data were recorded continuously and digitally stored. Description: The posterior dominant rhythm consists of 8 Hz activity of moderate voltage (25-35 uV) seen predominantly in posterior head regions, symmetric and reactive to eye opening and eye closing. Sleep was characterized by vertex waves, sleep spindles (12 to 14 Hz), maximal frontocentral region. Hyperventilation and photic stimulation were not performed.   IMPRESSION: This study is within normal limits. No seizures or epileptiform discharges were seen throughout the recording. Priyanka Cipriano Mile PA-C [facility] [phone]  Assessment/Recommendations: 59 year old female with known neurosarcoidosis, possible seizures, sleep apnea, prior pontine stroke and other comorbidities. She presented for further evaluation of stereotyped spells of AMS where she  starts becoming a little irritated or agitated, becomes argumentative and feels like she is losing her memory-has some recollection of those events but some other instances she does not recollect. She has been on Keppra for presumed seizures but was taken off due to suspicion that it was causing lethargy.  Initial plan was to treat the patient with 3 days of pulsed-dose steroids for possible neurosarcoidosis exacerbation. However, the patient was found to have a UTI upon admission. Her white count has improved with ABX treatment. EEG here did not show any epileptiform activity.  1. Her MRI brain was reviewed with Dr. Rory Percy on the day of admission. She has continued peripheral brainstem enhancement with adjacent parenchymal FLAIR signal abnormality, which is likely contributing to her spells of AMS. 2. Today, her exam is improved and her WBC count has trended down. Given that she is afebrile, UTI is now less of a concern and we agree with starting Solu-Medrol 1 g daily for 5 days along with Protonix for gut protection.   Electronically signed: Dr. Kerney Elbe 01/13/2020, 4:22 PM

## 2020-01-14 ENCOUNTER — Inpatient Hospital Stay (HOSPITAL_COMMUNITY): Payer: BLUE CROSS/BLUE SHIELD

## 2020-01-14 LAB — TROPONIN I (HIGH SENSITIVITY)
Troponin I (High Sensitivity): 23 ng/L — ABNORMAL HIGH (ref <18)
Troponin I (High Sensitivity): 25 ng/L — ABNORMAL HIGH (ref <18)

## 2020-01-14 LAB — BASIC METABOLIC PANEL
Anion gap: 12 (ref 5–15)
BUN: 7 mg/dL (ref 6–20)
CO2: 24 mmol/L (ref 22–32)
Calcium: 9 mg/dL (ref 8.9–10.3)
Chloride: 93 mmol/L — ABNORMAL LOW (ref 98–111)
Creatinine, Ser: 0.67 mg/dL (ref 0.44–1.00)
GFR calc Af Amer: >60 mL/min (ref >60)
GFR calc non Af Amer: >60 mL/min (ref >60)
Glucose, Bld: 171 mg/dL — ABNORMAL HIGH (ref 70–99)
Potassium: 3.8 mmol/L (ref 3.5–5.1)
Sodium: 129 mmol/L — ABNORMAL LOW (ref 135–145)

## 2020-01-14 LAB — MAGNESIUM: Magnesium: 1.8 mg/dL (ref 1.7–2.4)

## 2020-01-14 MED ORDER — POLYETHYLENE GLYCOL 3350 17 G PO PACK
17.0000 g | PACK | Freq: Every day | ORAL | Status: DC
  Administered 2020-01-14: 17 g via ORAL
  Filled 2020-01-14: qty 1

## 2020-01-14 MED ORDER — POLYETHYLENE GLYCOL 3350 17 G PO PACK
17.0000 g | PACK | Freq: Two times a day (BID) | ORAL | Status: DC
  Administered 2020-01-14 – 2020-01-16 (×4): 17 g via ORAL
  Filled 2020-01-14 (×5): qty 1

## 2020-01-14 MED ORDER — MAGNESIUM SULFATE 2 GM/50ML IV SOLN
2.0000 g | Freq: Once | INTRAVENOUS | Status: AC
  Administered 2020-01-14: 2 g via INTRAVENOUS
  Filled 2020-01-14: qty 50

## 2020-01-14 MED ORDER — NITROGLYCERIN 0.4 MG SL SUBL: 0.4000 mg | SUBLINGUAL_TABLET | SUBLINGUAL | Status: DC | PRN

## 2020-01-14 MED ORDER — SODIUM CHLORIDE 0.9 % IV SOLN: INTRAVENOUS | Status: DC

## 2020-01-14 MED ORDER — CEPHALEXIN 500 MG PO CAPS
500.0000 mg | ORAL_CAPSULE | Freq: Three times a day (TID) | ORAL | Status: AC
  Administered 2020-01-14 – 2020-01-16 (×6): 500 mg via ORAL
  Filled 2020-01-14 (×7): qty 1

## 2020-01-14 MED ORDER — IPRATROPIUM-ALBUTEROL 0.5-2.5 (3) MG/3ML IN SOLN: 3.0000 mL | Freq: Four times a day (QID) | RESPIRATORY_TRACT | Status: DC | PRN

## 2020-01-14 MED ORDER — PANTOPRAZOLE SODIUM 40 MG PO TBEC
40.0000 mg | DELAYED_RELEASE_TABLET | Freq: Every day | ORAL | Status: DC
  Administered 2020-01-14 – 2020-01-16 (×3): 40 mg via ORAL
  Filled 2020-01-14 (×3): qty 1

## 2020-01-14 MED ORDER — ADULT MULTIVITAMIN W/MINERALS CH
1.0000 | ORAL_TABLET | Freq: Every day | ORAL | Status: DC
  Administered 2020-01-14 – 2020-01-17 (×4): 1 via ORAL
  Filled 2020-01-14 (×4): qty 1

## 2020-01-14 MED ORDER — METOPROLOL TARTRATE 25 MG PO TABS
25.0000 mg | ORAL_TABLET | Freq: Two times a day (BID) | ORAL | Status: DC
  Administered 2020-01-14 – 2020-01-17 (×6): 25 mg via ORAL
  Filled 2020-01-14 (×6): qty 1

## 2020-01-14 MED ORDER — BISACODYL 10 MG RE SUPP
10.0000 mg | Freq: Once | RECTAL | Status: AC
  Administered 2020-01-14: 10 mg via RECTAL
  Filled 2020-01-14: qty 1

## 2020-01-14 MED ORDER — FLEET ENEMA 7-19 GM/118ML RE ENEM
1.0000 | ENEMA | Freq: Every day | RECTAL | Status: DC | PRN
  Administered 2020-01-14: 1 via RECTAL
  Filled 2020-01-14: qty 1

## 2020-01-14 NOTE — Progress Notes (Signed)
Pt c/o SOB. LS auscultated; no wheezing noted. Placed pt on 3 L O2 via n/c. Notified MD. Received order for chest x-ray, incentive spirometer and to notify night shift that pt needs to wear CPAP at bedtime.

## 2020-01-14 NOTE — Plan of Care (Signed)
  Problem: Coping: Goal: Level of anxiety will decrease Outcome: Progressing   Problem: Pain Managment: Goal: General experience of comfort will improve Outcome: Progressing   Problem: Skin Integrity: Goal: Risk for impaired skin integrity will decrease Outcome: Progressing   

## 2020-01-14 NOTE — Procedures (Addendum)
Patient Name:Rhonda [name] ACQ:[id number] Epilepsy Attending:Angelic Schnelle Barbra Sarks Referring Physician/Provider:Dr Kerney Elbe Duration:8/27/20212359 to 01/14/2020 1109  Patient history:59 year old with known neurosarcoidosis amongst other comorbidities, presenting with concerns for altered mental status and episodes/spells of varying levels of consciousness without any LOC or seizure-like activity.EEG to evaluate for seizure  Level of alertness:Awake, asleep  AEDs during EEG study:None  Technical aspects: This EEG study was done with scalp electrodes positioned according to the 10-20 International system of electrode placement. Electrical activity was acquired at a sampling rate of 500Hz  and reviewed with a high frequency filter of 70Hz  and a low frequency filter of 1Hz . EEG data were recorded continuously and digitally stored.   Description: The posterior dominant rhythm consists of8Hz  activity of moderate voltage (25-35 uV) seen predominantly in posterior head regions, symmetric and reactive to eye opening and eye closing. Sleep was characterized by vertex waves, sleep spindles (12 to 14 Hz), maximal frontocentral region. Hyperventilation and photic stimulation were not performed.   IMPRESSION: This study is within normal limits. No seizures or epileptiform discharges were seen throughout the recording.  [name] Barbra Sarks

## 2020-01-14 NOTE — Progress Notes (Signed)
PHARMACIST - PHYSICIAN COMMUNICATION  DR:   Tyrell Antonio  CONCERNING: IV to Oral Route Change Policy  RECOMMENDATION: This patient is receiving pantoprazole by the intravenous route.  Based on criteria approved by the Pharmacy and Therapeutics Committee, the intravenous medication(s) is/are being converted to the equivalent oral dose form(s).  Also ok to optimize her ceftriaxone to PO keflex to complete 5d of therapy per Dr. Tyrell Antonio. E.coli is pan sens except for Bactrim.  DESCRIPTION: These criteria include:  The patient is eating (either orally or via tube) and/or has been taking other orally administered medications for a least 24 hours  The patient has no evidence of active gastrointestinal bleeding or impaired GI absorption (gastrectomy, short bowel, patient on TNA or NPO).  If you have questions about this conversion, please contact the Pharmacy Department  []   [phone] )  Rhonda [name] []   [phone] )  [facility] [x]   [phone] )  Rhonda [name] []   [phone] )  [facility] []   [phone] )  [place], PharmD, [place], AAHIVP, CPP Infectious Disease Pharmacist 01/14/2020 8:17 AM

## 2020-01-14 NOTE — Progress Notes (Signed)
Initial Nutrition Assessment  DOCUMENTATION CODES:   Obesity unspecified  INTERVENTION:  Continue Ensure Enlive po BID, each supplement provides 350 kcal and 20 grams of protein  MVI with minerals daily   NUTRITION DIAGNOSIS:   Increased nutrient needs related to acute illness (UTI) as evidenced by estimated needs    GOAL:   Patient will meet greater than or equal to 90% of their needs    MONITOR:   PO intake, Labs, I & O's, Supplement acceptance, Weight trends  REASON FOR ASSESSMENT:   Malnutrition Screening Tool    ASSESSMENT:  RD working remotely.  59 year old female with history of neurosarcoidosis on chronic steroids, partial seizure, HTN, CVA, wheelchair-bound, OSA presented with AMS admitted for acute metabolic encephalopathy and UTI.  Patient followed by [facility] neurology, husband of pt reports she has been having staring spells and talking to him as if she is "sleepwalking" after recent tapering of steroids to transition to CellCept. Neurology evaluated on 8/27, started IV high dose steroids x 3 days and reassess.   Patient with good appetite and intake, eating 90-100% x 6 documented meals and drinking Ensure BID. Per chart, weights have trended up 5.5 lbs in the last 6 weeks, no weights for 2020 for review,  stable 97-99.9kg in 2019.   Medications reviewed and include: Keflex, Zestril, Lovaza, Oxybutynin, Protonix, Miralax IVPB: Methylprednisolone 1000 mg   Labs: BG 171,158,109, Na 129 (L), WBC 13.8 (H)  NUTRITION - FOCUSED PHYSICAL EXAM: Unable to complete at this time, RD working remotely.  Diet Order:   Diet Order            Diet Heart Room service appropriate? Yes; Fluid consistency: Thin  Diet effective now                 EDUCATION NEEDS:   No education needs have been identified at this time  Skin:  Skin Assessment: Reviewed RN Assessment  Last BM:  8/28-type 6  Height:   Ht Readings from Last 1 Encounters:  01/11/20 5\' 3"  (1.6 m)     Weight:   Wt Readings from Last 1 Encounters:  01/12/20 79.2 kg    BMI:  Body mass index is 30.93 kg/m.  Estimated Nutritional Needs:   Kcal:  1800-2000  Protein:  90-100  Fluid:  >/= 1.8 L/day  Lajuan Lines, RD, LDN Clinical Nutrition After Hours/Weekend Pager # in [place]

## 2020-01-14 NOTE — Progress Notes (Signed)
Was notified by telemetry at 1350 that pt had 5 runs of wide QRS complex then a regular beat then 14 runs of wide QRS complex lasting 9.4 seconds. Charge RN in to check on pt who is asymptomatic. Notified MD. Received order for IV Mag and STAT EKG. EKG completed and placed in chart.

## 2020-01-14 NOTE — Progress Notes (Addendum)
PROGRESS NOTE    JAE BRUCK  YTK:[id number] DOB: Mar 09, 1961 DOA: 01/11/2020 PCP: Bartholome Bill, MD   Brief Narrative: 59 year old-year-old with past medical history significant for prediabetes, neurosarcoidosis on chronic a steroid, history of partial seizure, hypertension, CVA and wheelchair-bound, OSA who presents with concern of altered mental status. Husband report her patient has been having staring spell and talking to him as if she is "sleepwalking" she follows at [facility] neurology as an outpatient and recently has been tapering her steroids to transition to CellCept.  Reported some nausea and vomiting and increased urinary frequency. ED physician discussed case with outpatient neurologist and high-dose steroid was recommended with outpatient follow-up. Patient continued to be lethargic and not back to baseline and she was admitted for further evaluation.  Neurology has been consulted.   Assessment & Plan:   Principal Problem:   AMS (altered mental status) Active Problems:   Essential hypertension   OSA (obstructive sleep apnea)   Leukocytosis   HLD (hyperlipidemia)   Acute lower UTI  Acute Metabolic encephalopathy:  Neurosarcoidosis vs seizure vs Infection.  Neurology following.  LTM EEG.. negative.  Discussed with neurology on 8/26, Dr Mendel Ryder plan to continue oral prednisone. Hold on starting IV steroids.  Evaluated by neurology 8/27, started IV High dose steroids, plan to received 3 days and reassess.   UTI; UA with 11-20 WBC Urine growing; E Coli Received  Ceftriaxone for 3 days. Plan to complete 5 days with Keflex.  Afebrile.   Hyponatremia; fluctuates.  Resume fluids  HTN; Continue with lisinopril.   HLD;  Continue with statins.   Leukocytosis; on steroids. Follow.  Oral thrush; on nystatin.  Wide complex on telemetry, transient. Check EKG and mg level.  Replete mg. Check troponin Added low dose metoprolol.  OSA;  Dyspnea;  Oxygen was  100 ra Patient complaining of dyspnea this afternoon.  She has constipation, and abdominal distension.  Nebulizer PRN Chest x ray low lung volume. Incentive spirometry ordered.  Mild elevation troponin, similar to previous level. Repeat EKG.  Report chest tightness/Dyspnea. Order nitroglycerin.   Constipation; enema, miralax ordered  Estimated body mass index is 30.93 kg/m as calculated from the following:   Height as of this encounter: 5\' 3"  (1.6 m).   Weight as of this encounter: 79.2 kg.   DVT prophylaxis: Lovenox Code Status: Full code Family Communication: no family at bedside, Husband over phone.  Disposition Plan:  Status is: Inpatient  Remains inpatient appropriate because:Ongoing diagnostic testing needed not appropriate for outpatient work up   Dispo: The patient is from: Home              Anticipated d/c is to: Home              Anticipated d/c date is: 2 days              Patient currently is not medically stable to d/c.        Consultants:   Neurology   Procedures:   LTM EEG  Antimicrobials:    Subjective: Alert, feels well.    Objective: Vitals:   01/13/20 2039 01/13/20 2356 01/14/20 0357 01/14/20 0823  BP: (!) 147/95 (!) 141/86 112/73 120/76  Pulse: 72 68 88 91  Resp: 18 18 18 20   Temp: 99 F (37.2 C) 98.6 F (37 C) 98.2 F (36.8 C) 98.4 F (36.9 C)  TempSrc: Oral Oral Oral Oral  SpO2: 98% 98% 99% 100%  Weight:      Height:  Intake/Output Summary (Last 24 hours) at 01/14/2020 0853 Last data filed at 01/14/2020 5916 Gross per 24 hour  Intake 598 ml  Output 4275 ml  Net -3677 ml   Filed Weights   01/11/20 1117 01/12/20 0113  Weight: 82 kg 79.2 kg    Examination:  General exam: NAD Respiratory system: CTA Cardiovascular system: S 1, S 2 RRR Gastrointestinal system: BS present, soft, nt Central nervous system: Alert, following command.   Data Reviewed: I have personally reviewed following labs and imaging  studies  CBC: Recent Labs  Lab 01/07/20 2235 01/07/20 2301 01/08/20 0803 01/09/20 0857 01/11/20 1249 01/12/20 0452 01/13/20 0807  WBC 14.9*   < > 13.6* 15.7* 15.2* 14.9* 13.8*  NEUTROABS 13.0*  --   --   --  11.3*  --   --   HGB 11.9*   < > 12.1 12.5 13.4 12.9 13.4  HCT 37.7   < > 38.8 38.2 45.3 39.7 41.4  MCV 88.9   < > 89.2 88.4 96.0 88.2 88.1  PLT 272   < > 212 268 207 245 244   < > = values in this interval not displayed.   Basic Metabolic Panel: Recent Labs  Lab 01/07/20 2235 01/07/20 2235 01/07/20 2301 01/07/20 2301 01/08/20 0803 01/09/20 0857 01/11/20 1249 01/12/20 0452 01/13/20 0807  NA 130*   < > 129*  --   --  130* 128* 130* 129*  K 3.9   < > 4.0  --   --  4.1 4.0 3.9 4.3  CL 92*   < > 89*  --   --  95* 92* 92* 92*  CO2 32  --   --   --   --  26 24 29 28   GLUCOSE 108*   < > 104*  --   --  142* 110* 109* 158*  BUN 10   < > 10  --   --  9 10 12 8   CREATININE 0.72   < > 0.70   < > 0.64 0.70 0.53 0.65 0.58  CALCIUM 9.0  --   --   --   --  8.7* 8.9 8.8* 9.1  MG  --   --   --   --   --  1.7  --   --   --    < > = values in this interval not displayed.   GFR: Estimated Creatinine Clearance: 76.4 mL/min (by C-G formula based on SCr of 0.58 mg/dL). Liver Function Tests: Recent Labs  Lab 01/07/20 2235 01/11/20 1249  AST 28 QUANTITY NOT SUFFICIENT, UNABLE TO PERFORM TEST  ALT 33 QUANTITY NOT SUFFICIENT, UNABLE TO PERFORM TEST  ALKPHOS 60 64  BILITOT 0.4 QUANTITY NOT SUFFICIENT, UNABLE TO PERFORM TEST  PROT 5.2* 5.6*  ALBUMIN 2.8* 3.1*   No results for input(s): LIPASE, AMYLASE in the last 168 hours. Recent Labs  Lab 01/11/20 1249  AMMONIA 13   Coagulation Profile: Recent Labs  Lab 01/07/20 2235  INR 1.0   Cardiac Enzymes: No results for input(s): CKTOTAL, CKMB, CKMBINDEX, TROPONINI in the last 168 hours. BNP (last 3 results) No results for input(s): PROBNP in the last 8760 hours. HbA1C: No results for input(s): HGBA1C in the last 72  hours. CBG: Recent Labs  Lab 01/08/20 1227 01/08/20 1709 01/08/20 2047 01/09/20 0815 01/11/20 1051  GLUCAP 152* 132* 168* 155* 120*   Lipid Profile: No results for input(s): CHOL, HDL, LDLCALC, TRIG, CHOLHDL, LDLDIRECT in the last 72 hours. Thyroid  Function Tests: No results for input(s): TSH, T4TOTAL, FREET4, T3FREE, THYROIDAB in the last 72 hours. Anemia Panel: No results for input(s): VITAMINB12, FOLATE, FERRITIN, TIBC, IRON, RETICCTPCT in the last 72 hours. Sepsis Labs: No results for input(s): PROCALCITON, LATICACIDVEN in the last 168 hours.  Recent Results (from the past 240 hour(s))  SARS Coronavirus 2 by RT PCR (hospital order, performed in [facility] hospital lab) Nasopharyngeal Nasopharyngeal Swab     Status: None   Collection Time: 01/08/20  2:06 AM   Specimen: Nasopharyngeal Swab  Result Value Ref Range Status   SARS Coronavirus 2 NEGATIVE NEGATIVE Final    Comment: (NOTE) SARS-CoV-2 target nucleic acids are NOT DETECTED.  The SARS-CoV-2 RNA is generally detectable in upper and lower respiratory specimens during the acute phase of infection. The lowest concentration of SARS-CoV-2 viral copies this assay can detect is 250 copies / mL. A negative result does not preclude SARS-CoV-2 infection and should not be used as the sole basis for treatment or other patient management decisions.  A negative result may occur with improper specimen collection / handling, submission of specimen other than nasopharyngeal swab, presence of viral mutation(s) within the areas targeted by this assay, and inadequate number of viral copies (<250 copies / mL). A negative result must be combined with clinical observations, patient history, and epidemiological information.  Fact Sheet for Patients:   StrictlyIdeas.no  Fact Sheet for Healthcare Providers: BankingDealers.co.za  This test is not yet approved or  cleared by the [place]  FDA and has been authorized for detection and/or diagnosis of SARS-CoV-2 by FDA under an Emergency Use Authorization (EUA).  This EUA will remain in effect (meaning this test can be used) for the duration of the COVID-19 declaration under Section 564(b)(1) of the Act, 21 U.S.C. section 360bbb-3(b)(1), unless the authorization is terminated or revoked sooner.  Performed at [facility], [place] [address], [place],  [address]   Urine culture     Status: Abnormal   Collection Time: 01/11/20  3:07 PM   Specimen: Urine, Random  Result Value Ref Range Status   Specimen Description URINE, RANDOM  Final   Special Requests   Final    NONE Performed at [facility], [place] [address], [place], [place] [address]    Culture >=100,000 COLONIES/mL ESCHERICHIA COLI (A)  Final   Report Status 01/13/2020 FINAL  Final   Organism ID, Bacteria ESCHERICHIA COLI (A)  Final      Susceptibility   Escherichia coli - MIC*    AMPICILLIN <=2 SENSITIVE Sensitive     CEFAZOLIN <=4 SENSITIVE Sensitive     CEFTRIAXONE <=0.25 SENSITIVE Sensitive     CIPROFLOXACIN <=0.25 SENSITIVE Sensitive     GENTAMICIN 4 SENSITIVE Sensitive     IMIPENEM <=0.25 SENSITIVE Sensitive     NITROFURANTOIN <=16 SENSITIVE Sensitive     TRIMETH/SULFA >=320 RESISTANT Resistant     AMPICILLIN/SULBACTAM <=2 SENSITIVE Sensitive     PIP/TAZO <=4 SENSITIVE Sensitive     * >=100,000 COLONIES/mL ESCHERICHIA COLI  SARS Coronavirus 2 by RT PCR (hospital order, performed in [place] hospital lab) Nasopharyngeal Nasopharyngeal Swab     Status: None   Collection Time: 01/11/20 10:35 PM   Specimen: Nasopharyngeal Swab  Result Value Ref Range Status   SARS Coronavirus 2 NEGATIVE NEGATIVE Final    Comment: (NOTE) SARS-CoV-2 target nucleic acids are NOT DETECTED.  The SARS-CoV-2 RNA is generally detectable in upper and lower respiratory specimens during the acute phase  of infection. The lowest concentration of SARS-CoV-2 viral  copies this assay can detect is 250 copies / mL. A negative result does not preclude SARS-CoV-2 infection and should not be used as the sole basis for treatment or other patient management decisions.  A negative result may occur with improper specimen collection / handling, submission of specimen other than nasopharyngeal swab, presence of viral mutation(s) within the areas targeted by this assay, and inadequate number of viral copies (<250 copies / mL). A negative result must be combined with clinical observations, patient history, and epidemiological information.  Fact Sheet for Patients:   StrictlyIdeas.no  Fact Sheet for Healthcare Providers: BankingDealers.co.za  This test is not yet approved or  cleared by the [place] FDA and has been authorized for detection and/or diagnosis of SARS-CoV-2 by FDA under an Emergency Use Authorization (EUA).  This EUA will remain in effect (meaning this test can be used) for the duration of the COVID-19 declaration under Section 564(b)(1) of the Act, 21 U.S.C. section 360bbb-3(b)(1), unless the authorization is terminated or revoked sooner.  Performed at [facility], [place] [address] [place], [place] [address]          Radiology Studies: Overnight EEG with video  Result Date: 01/12/2020 Lora Havens, MD     01/13/2020 10:02 AM Patient Name: Rhonda [name] MRN: [id number] Epilepsy Attending: Lora Havens Referring Physician/Provider: Dr Kerney Elbe Duration: 01/11/2020 1751 to 01/12/2020 0141 to 01/12/2020 0816 to 01/12/2020 0000 Patient history: 59 year old with known neurosarcoidosis amongst other comorbidities, presenting with concerns for altered mental status and episodes/spells of varying levels of consciousness without any LOC or seizure-like activity. EEG to evaluate for seizure Level of alertness: Awake, asleep AEDs during EEG study: None Technical aspects: This EEG study  was done with scalp electrodes positioned according to the 10-20 International system of electrode placement. Electrical activity was acquired at a sampling rate of 500Hz  and reviewed with a high frequency filter of 70Hz  and a low frequency filter of 1Hz . EEG data were recorded continuously and digitally stored. Description: The posterior dominant rhythm consists of 8 Hz activity of moderate voltage (25-35 uV) seen predominantly in posterior head regions, symmetric and reactive to eye opening and eye closing. Sleep was characterized by vertex waves, sleep spindles (12 to 14 Hz), maximal frontocentral region. Hyperventilation and photic stimulation were not performed.   IMPRESSION: This study is within normal limits. No seizures or epileptiform discharges were seen throughout the recording. Priyanka Barbra Sarks        Scheduled Meds: . aspirin EC  81 mg Oral Daily  . atorvastatin  80 mg Oral QHS  . cephALEXin  500 mg Oral Q8H  . enoxaparin (LOVENOX) injection  40 mg Subcutaneous Q24H  . feeding supplement (ENSURE ENLIVE)  237 mL Oral BID BM  . lisinopril  5 mg Oral QHS  . nystatin  5 mL Oral QID  . omega-3 acid ethyl esters  1 g Oral Daily  . oxybutynin  5 mg Oral TID  . pantoprazole  40 mg Oral Q supper   Continuous Infusions: . methylPREDNISolone (SOLU-MEDROL) injection 1,000 mg (01/13/20 1847)     LOS: 3 days    Time spent: 35 minutes,     Dezyre Hoefer A Estel Tonelli, MD [facility]   If 7PM-7AM, please contact night-coverage [email or web address]  01/14/2020, 8:53 AM

## 2020-01-14 NOTE — Progress Notes (Signed)
vLTM EEG complete. No skin breakdown 

## 2020-01-15 ENCOUNTER — Inpatient Hospital Stay (HOSPITAL_COMMUNITY): Payer: BLUE CROSS/BLUE SHIELD

## 2020-01-15 DIAGNOSIS — R9431 Abnormal electrocardiogram [ECG] [EKG]: Secondary | ICD-10-CM

## 2020-01-15 LAB — BASIC METABOLIC PANEL
Anion gap: 11 (ref 5–15)
Anion gap: 8 (ref 5–15)
Anion gap: 8 (ref 5–15)
Anion gap: 6 (ref 5–15)
BUN: 11 mg/dL (ref 6–20)
BUN: 10 mg/dL (ref 6–20)
BUN: 9 mg/dL (ref 6–20)
BUN: 9 mg/dL (ref 6–20)
CO2: 24 mmol/L (ref 22–32)
CO2: 26 mmol/L (ref 22–32)
CO2: 26 mmol/L (ref 22–32)
CO2: 25 mmol/L (ref 22–32)
Calcium: 8.6 mg/dL — ABNORMAL LOW (ref 8.9–10.3)
Calcium: 8.4 mg/dL — ABNORMAL LOW (ref 8.9–10.3)
Calcium: 8.7 mg/dL — ABNORMAL LOW (ref 8.9–10.3)
Calcium: 8.2 mg/dL — ABNORMAL LOW (ref 8.9–10.3)
Chloride: 91 mmol/L — ABNORMAL LOW (ref 98–111)
Chloride: 91 mmol/L — ABNORMAL LOW (ref 98–111)
Chloride: 93 mmol/L — ABNORMAL LOW (ref 98–111)
Chloride: 93 mmol/L — ABNORMAL LOW (ref 98–111)
Creatinine, Ser: 0.74 mg/dL (ref 0.44–1.00)
Creatinine, Ser: 0.65 mg/dL (ref 0.44–1.00)
Creatinine, Ser: 0.6 mg/dL (ref 0.44–1.00)
Creatinine, Ser: 0.73 mg/dL (ref 0.44–1.00)
GFR calc Af Amer: >60 mL/min (ref >60)
GFR calc Af Amer: >60 mL/min (ref >60)
GFR calc Af Amer: >60 mL/min (ref >60)
GFR calc Af Amer: >60 mL/min (ref >60)
GFR calc non Af Amer: >60 mL/min (ref >60)
GFR calc non Af Amer: >60 mL/min (ref >60)
GFR calc non Af Amer: >60 mL/min (ref >60)
GFR calc non Af Amer: >60 mL/min (ref >60)
Glucose, Bld: 205 mg/dL — ABNORMAL HIGH (ref 70–99)
Glucose, Bld: 168 mg/dL — ABNORMAL HIGH (ref 70–99)
Glucose, Bld: 110 mg/dL — ABNORMAL HIGH (ref 70–99)
Glucose, Bld: 180 mg/dL — ABNORMAL HIGH (ref 70–99)
Potassium: 3.3 mmol/L — ABNORMAL LOW (ref 3.5–5.1)
Potassium: 4.5 mmol/L (ref 3.5–5.1)
Potassium: 5.5 mmol/L — ABNORMAL HIGH (ref 3.5–5.1)
Potassium: 5.6 mmol/L — ABNORMAL HIGH (ref 3.5–5.1)
Sodium: 126 mmol/L — ABNORMAL LOW (ref 135–145)
Sodium: 125 mmol/L — ABNORMAL LOW (ref 135–145)
Sodium: 127 mmol/L — ABNORMAL LOW (ref 135–145)
Sodium: 124 mmol/L — ABNORMAL LOW (ref 135–145)

## 2020-01-15 LAB — OSMOLALITY, URINE: Osmolality, Ur: 146 mOsm/kg — ABNORMAL LOW (ref 300–900)

## 2020-01-15 LAB — ECHOCARDIOGRAM LIMITED
Area-P 1/2: 2.56 cm2
Height: 63 in
S' Lateral: 2.6 cm
Weight: 2793.67 oz

## 2020-01-15 LAB — SODIUM, URINE, RANDOM: Sodium, Ur: <10 mmol/L

## 2020-01-15 LAB — CBC
HCT: 37.7 % (ref 36.0–46.0)
Hemoglobin: 12.4 g/dL (ref 12.0–15.0)
MCH: 28.8 pg (ref 26.0–34.0)
MCHC: 32.9 g/dL (ref 30.0–36.0)
MCV: 87.5 fL (ref 80.0–100.0)
Platelets: 239 10*3/uL (ref 150–400)
RBC: 4.31 MIL/uL (ref 3.87–5.11)
RDW: 16.4 % — ABNORMAL HIGH (ref 11.5–15.5)
WBC: 19 10*3/uL — ABNORMAL HIGH (ref 4.0–10.5)
nRBC: 0.1 % (ref 0.0–0.2)

## 2020-01-15 LAB — D-DIMER, QUANTITATIVE: D-Dimer, Quant: 0.52 ug/mL-FEU — ABNORMAL HIGH (ref 0.00–0.50)

## 2020-01-15 LAB — GLUCOSE, CAPILLARY
Glucose-Capillary: 144 mg/dL — ABNORMAL HIGH (ref 70–99)
Glucose-Capillary: 161 mg/dL — ABNORMAL HIGH (ref 70–99)
Glucose-Capillary: 334 mg/dL — ABNORMAL HIGH (ref 70–99)

## 2020-01-15 LAB — MAGNESIUM: Magnesium: 2.3 mg/dL (ref 1.7–2.4)

## 2020-01-15 MED ORDER — SODIUM CHLORIDE 0.9 % IV BOLUS
500.0000 mL | Freq: Once | INTRAVENOUS | Status: AC
  Administered 2020-01-15: 500 mL via INTRAVENOUS

## 2020-01-15 MED ORDER — INSULIN ASPART 100 UNIT/ML ~~LOC~~ SOLN
0.0000 [IU] | Freq: Three times a day (TID) | SUBCUTANEOUS | Status: DC
  Administered 2020-01-15: 2 [IU] via SUBCUTANEOUS
  Administered 2020-01-15 – 2020-01-16 (×2): 1 [IU] via SUBCUTANEOUS
  Administered 2020-01-16: 2 [IU] via SUBCUTANEOUS
  Administered 2020-01-16: 5 [IU] via SUBCUTANEOUS
  Administered 2020-01-17: 2 [IU] via SUBCUTANEOUS
  Administered 2020-01-17: 3 [IU] via SUBCUTANEOUS

## 2020-01-15 MED ORDER — INSULIN ASPART 100 UNIT/ML ~~LOC~~ SOLN: 0.0000 [IU] | Freq: Three times a day (TID) | SUBCUTANEOUS | Status: DC

## 2020-01-15 MED ORDER — INSULIN ASPART 100 UNIT/ML ~~LOC~~ SOLN
0.0000 [IU] | Freq: Every day | SUBCUTANEOUS | Status: DC
  Administered 2020-01-15: 3 [IU] via SUBCUTANEOUS

## 2020-01-15 MED ORDER — POTASSIUM CHLORIDE CRYS ER 20 MEQ PO TBCR
40.0000 meq | EXTENDED_RELEASE_TABLET | ORAL | Status: AC
  Administered 2020-01-15 (×2): 40 meq via ORAL
  Filled 2020-01-15 (×2): qty 2

## 2020-01-15 MED ORDER — SODIUM CHLORIDE 0.9 % IV SOLN: INTRAVENOUS | Status: DC

## 2020-01-15 NOTE — Progress Notes (Signed)
Pt transported to procedure areas for echo by transporters. Pt was alert and oriented *4.

## 2020-01-15 NOTE — Progress Notes (Signed)
  Echocardiogram 2D Echocardiogram has been performed.  Rhonda [name] 01/15/2020, 11:23 AM

## 2020-01-15 NOTE — Progress Notes (Signed)
Pt has home CPAP she will be using tonight.

## 2020-01-15 NOTE — Progress Notes (Addendum)
PROGRESS NOTE    Rhonda [name]  ZGY:[id number] DOB: 1960-11-03 DOA: 01/11/2020 PCP: Bartholome Bill, MD   Brief Narrative: 59 year old-year-old with past medical history significant for prediabetes, neurosarcoidosis on chronic a steroid, history of partial seizure, hypertension, CVA and wheelchair-bound, OSA who presents with concern of altered mental status. Husband report her patient has been having staring spell and talking to him as if she is "sleepwalking" she follows at [facility] neurolog[facility] as an outpatient and recently has been tapering her steroids to transition to CellCept.  Reported some nausea and vomiting and increased urinary frequency. ED physician discussed case with outpatient neurologist and high-dose steroid was recommended with outpatient follow-up. Patient continued to be lethargic and not back to baseline and she was admitted for further evaluation.  Neurology has been consulted.   Assessment & Plan:   Principal Problem:   AMS (altered mental status) Active Problems:   Essential hypertension   OSA (obstructive sleep apnea)   Leukocytosis   HLD (hyperlipidemia)   Acute lower UTI  Acute Metabolic encephalopathy:  Neurosarcoidosis vs seizure vs Infection.  Neurology following.  LTM EEG.. negative.  Discussed with neurology on 8/26, Dr Mendel Ryder plan to continue oral prednisone. Hold on starting IV steroids.  Evaluated by neurology 8/27, started IV High dose steroids, plan to received 3 days and reassess.  Improved  UTI; UA with 11-20 WBC Urine growing; E Coli Received  Ceftriaxone for 3 days. Plan to complete 5 days with Keflex.  Afebrile.   Hyponatremia;  Only received 700 fluids because lost IV access.  Urine osmolality low, not consistent with SIADH.  Will try IV fluids. Check labs tonight to make sure sodium is not decreasing further./   HTN; Hold lisinopril due to hyponatremia. Started BB for SVT  HLD;  Continue with statins.   Leukocytosis;  on steroids. Follow.  Oral thrush; on nystatin.  Wide complex on telemetry, transient. Mg replaced.  Started low dose metoprolol.   OSA; CIPAP, encourage.   Dyspnea;  Oxygen was 100 ra Patient complaining of dyspnea this afternoon.  She has constipation, and abdominal distension.  Nebulizer PRN Chest x ray low lung volume. Incentive spirometry ordered.  Mild elevation troponin, similar to previous level. ECHO ordered Check D dimer.   Constipation; enema, miralax ordered. Had good response./    Estimated body mass index is 30.93 kg/m as calculated from the following:   Height as of this encounter: 5\' 3"  (1.6 m).   Weight as of this encounter: 79.2 kg.   DVT prophylaxis: Lovenox Code Status: Full code Family Communication: no family at bedside, Husband  At bedside 8/28 Disposition Plan:  Status is: Inpatient  Remains inpatient appropriate because:Ongoing diagnostic testing needed not appropriate for outpatient work up   Dispo: The patient is from: Home              Anticipated d/c is to: Home              Anticipated d/c date is: 2 days              Patient currently is not medically stable to d/c.        Consultants:   Neurology   Procedures:   LTM EEG  Antimicrobials:    Subjective: She is breathing better today.  She had large BM last night./     Objective: Vitals:   01/14/20 1928 01/14/20 2324 01/14/20 2334 01/15/20 0300  BP: 126/82  (!) 160/82 130/88  Pulse: 89 88 66  83  Resp: 18 18 18 18   Temp: 98.7 F (37.1 C)  98.5 F (36.9 C) 99.6 F (37.6 C)  TempSrc: Oral  Oral Oral  SpO2: 100% 96% 100% 100%  Weight:      Height:        Intake/Output Summary (Last 24 hours) at 01/15/2020 0737 Last data filed at 01/15/2020 0602 Gross per 24 hour  Intake 1163.73 ml  Output 1400 ml  Net -236.27 ml   Filed Weights   01/11/20 1117 01/12/20 0113  Weight: 82 kg 79.2 kg    Examination:  General exam: NAD Respiratory system:  CTA Cardiovascular system: S 1, S 2 RRR Gastrointestinal system: BS present, soft, nt Central nervous system: Alert, follows command  Data Reviewed: I have personally reviewed following labs and imaging studies  CBC: Recent Labs  Lab 01/09/20 0857 01/11/20 1249 01/12/20 0452 01/13/20 0807 01/15/20 0128  WBC 15.7* 15.2* 14.9* 13.8* 19.0*  NEUTROABS  --  11.3*  --   --   --   HGB 12.5 13.4 12.9 13.4 12.4  HCT 38.2 45.3 39.7 41.4 37.7  MCV 88.4 96.0 88.2 88.1 87.5  PLT 268 207 245 244 428   Basic Metabolic Panel: Recent Labs  Lab 01/09/20 0857 01/09/20 0857 01/11/20 1249 01/12/20 0452 01/13/20 0807 01/14/20 0745 01/15/20 0128  NA 130*   < > 128* 130* 129* 129* 126*  K 4.1   < > 4.0 3.9 4.3 3.8 3.3*  CL 95*   < > 92* 92* 92* 93* 91*  CO2 26   < > 24 29 28 24 24   GLUCOSE 142*   < > 110* 109* 158* 171* 205*  BUN 9   < > 10 12 8 7 11   CREATININE 0.70   < > 0.53 0.65 0.58 0.67 0.74  CALCIUM 8.7*   < > 8.9 8.8* 9.1 9.0 8.6*  MG 1.7  --   --   --   --  1.8 2.3   < > = values in this interval not displayed.   GFR: Estimated Creatinine Clearance: 76.4 mL/min (by C-G formula based on SCr of 0.74 mg/dL). Liver Function Tests: Recent Labs  Lab 01/11/20 1249  AST QUANTITY NOT SUFFICIENT, UNABLE TO PERFORM TEST  ALT QUANTITY NOT SUFFICIENT, UNABLE TO PERFORM TEST  ALKPHOS 32  BILITOT QUANTITY NOT SUFFICIENT, UNABLE TO PERFORM TEST  PROT 5.6*  ALBUMIN 3.1*   No results for input(s): LIPASE, AMYLASE in the last 168 hours. Recent Labs  Lab 01/11/20 1249  AMMONIA 13   Coagulation Profile: No results for input(s): INR, PROTIME in the last 168 hours. Cardiac Enzymes: No results for input(s): CKTOTAL, CKMB, CKMBINDEX, TROPONINI in the last 168 hours. BNP (last 3 results) No results for input(s): PROBNP in the last 8760 hours. HbA1C: No results for input(s): HGBA1C in the last 72 hours. CBG: Recent Labs  Lab 01/08/20 1227 01/08/20 1709 01/08/20 2047 01/09/20 0815  01/11/20 1051  GLUCAP 152* 132* 168* 155* 120*   Lipid Profile: No results for input(s): CHOL, HDL, LDLCALC, TRIG, CHOLHDL, LDLDIRECT in the last 72 hours. Thyroid Function Tests: No results for input(s): TSH, T4TOTAL, FREET4, T3FREE, THYROIDAB in the last 72 hours. Anemia Panel: No results for input(s): VITAMINB12, FOLATE, FERRITIN, TIBC, IRON, RETICCTPCT in the last 72 hours. Sepsis Labs: No results for input(s): PROCALCITON, LATICACIDVEN in the last 168 hours.  Recent Results (from the past 240 hour(s))  SARS Coronavirus 2 by RT PCR (hospital order, performed in [facility]  hospital lab) Nasopharyngeal Nasopharyngeal Swab     Status: None   Collection Time: 01/08/20  2:06 AM   Specimen: Nasopharyngeal Swab  Result Value Ref Range Status   SARS Coronavirus 2 NEGATIVE NEGATIVE Final    Comment: (NOTE) SARS-CoV-2 target nucleic acids are NOT DETECTED.  The SARS-CoV-2 RNA is generally detectable in upper and lower respiratory specimens during the acute phase of infection. The lowest concentration of SARS-CoV-2 viral copies this assay can detect is 250 copies / mL. A negative result does not preclude SARS-CoV-2 infection and should not be used as the sole basis for treatment or other patient management decisions.  A negative result may occur with improper specimen collection / handling, submission of specimen other than nasopharyngeal swab, presence of viral mutation(s) within the areas targeted by this assay, and inadequate number of viral copies (<250 copies / mL). A negative result must be combined with clinical observations, patient history, and epidemiological information.  Fact Sheet for Patients:   StrictlyIdeas.no  Fact Sheet for Healthcare Providers: BankingDealers.co.za  This test is not yet approved or  cleared by the [place] FDA and has been authorized for detection and/or diagnosis of SARS-CoV-2 by FDA under an  Emergency Use Authorization (EUA).  This EUA will remain in effect (meaning this test can be used) for the duration of the COVID-19 declaration under Section 564(b)(1) of the Act, 21 U.S.C. section 360bbb-3(b)(1), unless the authorization is terminated or revoked sooner.  Performed at [facility], [place] [address], [place], [place] [address]   Urine culture     Status: Abnormal   Collection Time: 01/11/20  3:07 PM   Specimen: Urine, Random  Result Value Ref Range Status   Specimen Description URINE, RANDOM  Final   Special Requests   Final    NONE Performed at [facility], [place] [address] [place], [place] [address]    Culture >=100,000 COLONIES/mL ESCHERICHIA COLI (A)  Final   Report Status 01/13/2020 FINAL  Final   Organism ID, Bacteria ESCHERICHIA COLI (A)  Final      Susceptibility   Escherichia coli - MIC*    AMPICILLIN <=2 SENSITIVE Sensitive     CEFAZOLIN <=4 SENSITIVE Sensitive     CEFTRIAXONE <=0.25 SENSITIVE Sensitive     CIPROFLOXACIN <=0.25 SENSITIVE Sensitive     GENTAMICIN 4 SENSITIVE Sensitive     IMIPENEM <=0.25 SENSITIVE Sensitive     NITROFURANTOIN <=16 SENSITIVE Sensitive     TRIMETH/SULFA >=320 RESISTANT Resistant     AMPICILLIN/SULBACTAM <=2 SENSITIVE Sensitive     PIP/TAZO <=4 SENSITIVE Sensitive     * >=100,000 COLONIES/mL ESCHERICHIA COLI  SARS Coronavirus 2 by RT PCR (hospital order, performed in [place] hospital lab) Nasopharyngeal Nasopharyngeal Swab     Status: None   Collection Time: 01/11/20 10:35 PM   Specimen: Nasopharyngeal Swab  Result Value Ref Range Status   SARS Coronavirus 2 NEGATIVE NEGATIVE Final    Comment: (NOTE) SARS-CoV-2 target nucleic acids are NOT DETECTED.  The SARS-CoV-2 RNA is generally detectable in upper and lower respiratory specimens during the acute phase of infection. The lowest concentration of SARS-CoV-2 viral copies this assay can detect is 250 copies / mL. A negative result does not preclude  SARS-CoV-2 infection and should not be used as the sole basis for treatment or other patient management decisions.  A negative result may occur with improper specimen collection / handling, submission of specimen other than nasopharyngeal swab, presence of viral mutation(s) within  the areas targeted by this assay, and inadequate number of viral copies (<250 copies / mL). A negative result must be combined with clinical observations, patient history, and epidemiological information.  Fact Sheet for Patients:   StrictlyIdeas.no  Fact Sheet for Healthcare Providers: BankingDealers.co.za  This test is not yet approved or  cleared by the [place] FDA and has been authorized for detection and/or diagnosis of SARS-CoV-2 by FDA under an Emergency Use Authorization (EUA).  This EUA will remain in effect (meaning this test can be used) for the duration of the COVID-19 declaration under Section 564(b)(1) of the Act, 21 U.S.C. section 360bbb-3(b)(1), unless the authorization is terminated or revoked sooner.  Performed at [facility], [place] [address], [place], [place] [address]          Radiology Studies: DG CHEST PORT 1 VIEW  Result Date: 01/14/2020 CLINICAL DATA:  Shortness of breath. EXAM: PORTABLE CHEST 1 VIEW COMPARISON:  Radiograph 01/11/2020. FINDINGS: Lung volumes are low.The cardiomediastinal contours are normal. Subsegmental atelectasis in the right greater than left lung base. Pulmonary vasculature is normal. No consolidation, pleural effusion, or pneumothorax. No acute osseous abnormalities are seen. IMPRESSION: Low lung volumes with bibasilar atelectasis. Electronically Signed   By: Keith Rake M.D.   On: 01/14/2020 17:36        Scheduled Meds: . aspirin EC  81 mg Oral Daily  . atorvastatin  80 mg Oral QHS  . cephALEXin  500 mg Oral Q8H  . enoxaparin (LOVENOX) injection  40 mg Subcutaneous Q24H  . feeding  supplement (ENSURE ENLIVE)  237 mL Oral BID BM  . metoprolol tartrate  25 mg Oral BID  . multivitamin with minerals  1 tablet Oral Daily  . nystatin  5 mL Oral QID  . omega-3 acid ethyl esters  1 g Oral Daily  . oxybutynin  5 mg Oral TID  . pantoprazole  40 mg Oral Q supper  . polyethylene glycol  17 g Oral BID  . potassium chloride  40 mEq Oral Q4H   Continuous Infusions: . sodium chloride 100 mL/hr at 01/15/20 0602  . methylPREDNISolone (SOLU-MEDROL) injection Stopped (01/14/20 1042)  . sodium chloride       LOS: 4 days    Time spent: 35 minutes,     Caly Pellum A Antionio Negron, MD [facility]   If 7PM-7AM, please contact night-coverage [email or web address]  01/15/2020, 7:37 AM

## 2020-01-15 NOTE — Progress Notes (Signed)
Pt came back to the unit from echo. Alert and oriented *4. Bed is in lowest position.

## 2020-01-15 NOTE — Plan of Care (Signed)
  Problem: Education: Goal: Knowledge of General Education information will improve Description: Including pain rating scale, medication(s)/side effects and non-pharmacologic comfort measures Outcome: Progressing   Problem: Elimination: Goal: Will not experience complications related to bowel motility Outcome: Progressing   Problem: Coping: Goal: Level of anxiety will decrease Outcome: Progressing

## 2020-01-15 NOTE — Progress Notes (Signed)
Pt refused CPAP,stated that she would prefer her own which her husband would bring tomorrow, Enema was administered, tolerated well, large bm noted, will continue to monitor her.

## 2020-01-16 ENCOUNTER — Inpatient Hospital Stay (HOSPITAL_COMMUNITY): Payer: BLUE CROSS/BLUE SHIELD

## 2020-01-16 LAB — GLUCOSE, CAPILLARY
Glucose-Capillary: 148 mg/dL — ABNORMAL HIGH (ref 70–99)
Glucose-Capillary: 278 mg/dL — ABNORMAL HIGH (ref 70–99)
Glucose-Capillary: 146 mg/dL — ABNORMAL HIGH (ref 70–99)
Glucose-Capillary: 117 mg/dL — ABNORMAL HIGH (ref 70–99)

## 2020-01-16 LAB — BASIC METABOLIC PANEL
Anion gap: 9 (ref 5–15)
Anion gap: 8 (ref 5–15)
Anion gap: 10 (ref 5–15)
Anion gap: 8 (ref 5–15)
Anion gap: 10 (ref 5–15)
BUN: 9 mg/dL (ref 6–20)
BUN: 10 mg/dL (ref 6–20)
BUN: 11 mg/dL (ref 6–20)
BUN: 11 mg/dL (ref 6–20)
BUN: 11 mg/dL (ref 6–20)
CO2: 25 mmol/L (ref 22–32)
CO2: 26 mmol/L (ref 22–32)
CO2: 25 mmol/L (ref 22–32)
CO2: 25 mmol/L (ref 22–32)
CO2: 24 mmol/L (ref 22–32)
Calcium: 8 mg/dL — ABNORMAL LOW (ref 8.9–10.3)
Calcium: 8.1 mg/dL — ABNORMAL LOW (ref 8.9–10.3)
Calcium: 8.2 mg/dL — ABNORMAL LOW (ref 8.9–10.3)
Calcium: 8.2 mg/dL — ABNORMAL LOW (ref 8.9–10.3)
Calcium: 8.2 mg/dL — ABNORMAL LOW (ref 8.9–10.3)
Chloride: 92 mmol/L — ABNORMAL LOW (ref 98–111)
Chloride: 93 mmol/L — ABNORMAL LOW (ref 98–111)
Chloride: 93 mmol/L — ABNORMAL LOW (ref 98–111)
Chloride: 94 mmol/L — ABNORMAL LOW (ref 98–111)
Chloride: 94 mmol/L — ABNORMAL LOW (ref 98–111)
Creatinine, Ser: 0.63 mg/dL (ref 0.44–1.00)
Creatinine, Ser: 0.77 mg/dL (ref 0.44–1.00)
Creatinine, Ser: 0.75 mg/dL (ref 0.44–1.00)
Creatinine, Ser: 0.61 mg/dL (ref 0.44–1.00)
Creatinine, Ser: 0.73 mg/dL (ref 0.44–1.00)
GFR calc Af Amer: >60 mL/min (ref >60)
GFR calc Af Amer: >60 mL/min (ref >60)
GFR calc Af Amer: >60 mL/min (ref >60)
GFR calc Af Amer: >60 mL/min (ref >60)
GFR calc Af Amer: >60 mL/min (ref >60)
GFR calc non Af Amer: >60 mL/min (ref >60)
GFR calc non Af Amer: >60 mL/min (ref >60)
GFR calc non Af Amer: >60 mL/min (ref >60)
GFR calc non Af Amer: >60 mL/min (ref >60)
GFR calc non Af Amer: >60 mL/min (ref >60)
Glucose, Bld: 143 mg/dL — ABNORMAL HIGH (ref 70–99)
Glucose, Bld: 217 mg/dL — ABNORMAL HIGH (ref 70–99)
Glucose, Bld: 137 mg/dL — ABNORMAL HIGH (ref 70–99)
Glucose, Bld: 110 mg/dL — ABNORMAL HIGH (ref 70–99)
Glucose, Bld: 185 mg/dL — ABNORMAL HIGH (ref 70–99)
Potassium: 4.7 mmol/L (ref 3.5–5.1)
Potassium: 4.4 mmol/L (ref 3.5–5.1)
Potassium: 4.4 mmol/L (ref 3.5–5.1)
Potassium: 4.5 mmol/L (ref 3.5–5.1)
Potassium: 4.6 mmol/L (ref 3.5–5.1)
Sodium: 126 mmol/L — ABNORMAL LOW (ref 135–145)
Sodium: 127 mmol/L — ABNORMAL LOW (ref 135–145)
Sodium: 128 mmol/L — ABNORMAL LOW (ref 135–145)
Sodium: 127 mmol/L — ABNORMAL LOW (ref 135–145)
Sodium: 128 mmol/L — ABNORMAL LOW (ref 135–145)

## 2020-01-16 MED ORDER — ONDANSETRON HCL 4 MG/2ML IJ SOLN
4.0000 mg | Freq: Four times a day (QID) | INTRAMUSCULAR | Status: DC | PRN
  Administered 2020-01-16: 4 mg via INTRAVENOUS
  Filled 2020-01-16: qty 2

## 2020-01-16 MED ORDER — SODIUM CHLORIDE 0.9 % IV BOLUS
500.0000 mL | Freq: Once | INTRAVENOUS | Status: AC
  Administered 2020-01-16: 500 mL via INTRAVENOUS

## 2020-01-16 MED ORDER — SODIUM CHLORIDE 0.9 % IV SOLN: INTRAVENOUS | Status: DC

## 2020-01-16 MED ORDER — HYDRALAZINE HCL 25 MG PO TABS: 25.0000 mg | ORAL_TABLET | Freq: Four times a day (QID) | ORAL | Status: DC | PRN

## 2020-01-16 MED ORDER — ONDANSETRON HCL 4 MG/2ML IJ SOLN: 4.0000 mg | Freq: Four times a day (QID) | INTRAMUSCULAR | Status: DC | PRN

## 2020-01-16 MED ORDER — MELATONIN 3 MG PO TABS
3.0000 mg | ORAL_TABLET | Freq: Every day | ORAL | Status: DC
  Administered 2020-01-16: 3 mg via ORAL
  Filled 2020-01-16: qty 1

## 2020-01-16 NOTE — Progress Notes (Signed)
Neurology MD at the bedside to assess the patient

## 2020-01-16 NOTE — Progress Notes (Addendum)
Patient was seen for change in speech and mental status.  Mrs. Rhonda [name] is a 59 year old female with history of neurosarcoidosis who had been on chronic steroids, history of partial seizure, hypertension, and CVA, wheelchair-bound at baseline, who presented to the hospital 4 days ago with confusion, was admitted to the hospitalist service with neurology consulting, underwent MRI brain, EEG, and was started on high-dose steroids.  She had reportedly improved significantly and her primary RN did not detect any speech abnormality earlier this morning up until roughly 30 minutes ago when the patient seemed to be confused with stuttering speech.  Patient was examined and interviewed, is alert, in no apparent distress, talking on the phone with her daughter, but is noted to have odd staccato-type speech and is repeating phrases.  Cranial nerves are grossly intact, her sensation to light touch is intact throughout, and her strength is 5/5 in all extremities.  She can state the name of the hospital, her birthday, and current month, but unable to remember what she ate last night or how long she has been in the hospital.   Discussed the situation with neurology, their involvement and recs much appreciated. Likely related to her neurosarcoidosis, possibly worse in light of worsening hyponatremia and hyperglycemia. Plan to check CT head now, check urine osm and urine sodium, continue to restrict free-water, and give 500 cc saline.   ADDENDUM: The patient's daughter, Rhonda [name], has arrived at the bedside and provides helpful information.  Rhonda [name] was working here in the hospital last night, reports that her mother seems to have these episodes when she is tired, and had been calling her on the phone many times all throughout the night.  Rhonda [name] noticed shortly after midnight that her mother may be beginning to develop some subtle speech and cognitive changes, and these seemed more notable around 5 AM.  By approximately 5:40 AM,  when she was called by the patient again, Mrs. Rhonda [name] was repeating phrases on the phone, seem to be a little confused, and was exhibiting this staccato-type speech pattern that she gets with these episodes.

## 2020-01-16 NOTE — Plan of Care (Signed)
  Problem: Education: Goal: Knowledge of General Education information will improve Description: Including pain rating scale, medication(s)/side effects and non-pharmacologic comfort measures Outcome: Progressing   Problem: Coping: Goal: Level of anxiety will decrease Outcome: Progressing   

## 2020-01-16 NOTE — Progress Notes (Signed)
Pt developed some speech difficulty, On call MD notified, Dr Myna Hidalgo came to see the patient, will continue to assess and implement Dr's orders, Husband, son and daughter made aware. Daughter, Rhonda [name] is here with the patient, V/S within defined limits

## 2020-01-16 NOTE — Progress Notes (Signed)
Physical Therapy Treatment Patient Details Name: Rhonda [name] MRN: [id number] DOB: 1960-09-25 Today's Date: 01/16/2020    History of Present Illness Rhonda [name] is a 59 y.o. female with medical history significant for prediabetes, neurosarcoidosis on chronic steroid,hx of partial seizure,  hypertension, CVA and wheelchair-bound, OSA who presents with concerns of altered mental status. Recently here a week PTA.    PT Comments    Pt progressing towards physical therapy goals. Pt continues with decreased cognition; argumentative at times. Awareness for safety and logic decreased. Session limited by N/V - pt reporting dizziness upon standing, however no nystagmus noted. RN notified and present. Pt adamant that she wants to walk as much as possible and visibly frustrated when PT explained for safety we need to see how she does with a shorter walk before planning a longer walk. Min assist required overall today and recommend +2 if planning a longer walk next session. Will continue to follow and progress as able per POC.    Follow Up Recommendations  Outpatient PT     Equipment Recommendations  None recommended by PT    Recommendations for Other Services       Precautions / Restrictions Precautions Precautions: Fall Precaution Comments: Decreased safety awareness, incontinent of urine Restrictions Weight Bearing Restrictions: No    Mobility  Bed Mobility Overal bed mobility: Needs Assistance Bed Mobility: Supine to Sit     Supine to sit: Min assist     General bed mobility comments: Assist for RLE to advance off EOB when exiting to the L.   Transfers Overall transfer level: Needs assistance Equipment used: Rolling walker (2 wheeled) Transfers: Sit to/from [facility];Lateral/Scoot Transfers Sit to Stand: Min assist Stand pivot transfers: Min assist      Lateral/Scoot Transfers: Min guard General transfer comment: Scooted laterally to get closer to the  chair without cues from therapist. Min assist to power-up to full standing position and steady as she took pivotal steps around to the recliner chair.   Ambulation/Gait             General Gait Details: Unable to progress gait training. Noted from chart review pt mostly bedbound and transfers to the wheelchair with the RW. Pt asking to walk as much as possible today. She was visibly frustrated when therapist explained for safety we should see how she does with a few steps first before planning a longer walk. Ultimately we transferred to the chair only as pt began vomiting and had urinary incontinence upon sitting up.    Stairs             Wheelchair Mobility    Modified Rankin (Stroke Patients Only)       Balance Overall balance assessment: Needs assistance Sitting-balance support: Feet supported Sitting balance-[name]: Fair Sitting balance - Comments: able to sit without support Postural control: Posterior lean Standing balance support: Bilateral upper extremity supported;During functional activity Standing balance-[name]: Poor Standing balance comment: 1 UE support while standing, using R hand to wipe herself after voiding.                             Cognition Arousal/Alertness: Awake/alert Behavior During Therapy: WFL for tasks assessed/performed Overall Cognitive Status: Impaired/Different from baseline Area of Impairment: Attention;Safety/judgement;Awareness;Problem solving                   Current Attention Level: Sustained     Safety/Judgement: Decreased awareness of  safety;Decreased awareness of deficits Awareness: Intellectual Problem Solving: Slow processing;Decreased initiation;Difficulty sequencing;Requires verbal cues;Requires tactile cues General Comments: Pt very direct and knew what she wanted to do however at times inappropriate with logic and safety.       Exercises      General Comments        Pertinent  Vitals/Pain Pain Assessment: No/denies pain    Home Living                      Prior Function            PT Goals (current goals can now be found in the care plan section) Acute Rehab PT Goals Patient Stated Goal: "I need to walk as much as possible" PT Goal Formulation: Patient unable to participate in goal setting Time For Goal Achievement: 01/27/20 Potential to Achieve Goals: Good Progress towards PT goals: Progressing toward goals    Frequency    Min 3X/week      PT Plan Current plan remains appropriate    Co-evaluation              AM-PAC PT "6 Clicks" Mobility   Outcome Measure  Help needed turning from your back to your side while in a flat bed without using bedrails?: A Little Help needed moving from lying on your back to sitting on the side of a flat bed without using bedrails?: A Little Help needed moving to and from a bed to a chair (including a wheelchair)?: A Little Help needed standing up from a chair using your arms (e.g., wheelchair or bedside chair)?: A Little Help needed to walk in hospital room?: Total Help needed climbing 3-5 steps with a railing? : Total 6 Click Score: 14    End of Session Equipment Utilized During Treatment: Gait belt Activity Tolerance: Treatment limited secondary to medical complications (Comment) (Vomiting) Patient left: in chair;with chair alarm set;with call bell/phone within reach;with nursing/sitter in room Nurse Communication: Mobility status PT Visit Diagnosis: Muscle weakness (generalized) (M62.81);Other abnormalities of gait and mobility (R26.89)     Time: 0349-1791 PT Time Calculation (min) (ACUTE ONLY): 42 min  Charges:  $Gait Training: 23-37 mins $Therapeutic Activity: 8-22 mins                     Rhonda [name], PT, DPT Acute Rehabilitation Services Pager: [phone] Office: [phone]    Rhonda [name] 01/16/2020, 3:18 PM

## 2020-01-16 NOTE — Progress Notes (Addendum)
Called by on-call hospitalist regarding somewhat of a different neurological exam with stuttering speech. Known neurosarcoidosis, on steroids, h/o AMS concerning for seizures,  multiple days on video EEG without any seizures. Progressive hyponatremia-might be lowering seizure threshold versus contributing to altered mental status. Found to have UTI on admission which is being treated, and exam was presumably improving till earlier yesterday.  Recommendations: -Stat CT head to rule out any acute process. -Correction of hyponatremia and sugars per primary team. -Neurology team will follow-up  -- Amie Portland, MD [facility] Pager: [phone] If 7pm to 7am, please call on call as listed on AMION.

## 2020-01-16 NOTE — Consult Note (Signed)
Reason for Consult: Hyponatremia Referring Physician: Dr. [name] is an 59 y.o. female.  She has had hyperkalemia at least since March 2021.  She has relatively normal renal function.  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  She also has history of CVA and is wheelchair-bound.  Blood pressure 154/110 pulse 70 temperature 98.4 O2 sats 99% 2 L nasal cannula.  Urine output 2.6 L 01/15/2020 appears to have had negative fluid balance since admission 01/12/2020  Sodium 126 potassium 4.7 chloride 92 CO2 25 BUN 9 creatinine 0.63 calcium 8 glucose 143 hemoglobin 12.4  Aspirin 81 mg daily Lipitor 80 mg daily Keflex 500 mg twice daily Lovenox every 24 hours insulin sliding scale, metoprolol 25 mg twice daily multivitamins 1 daily  Urinalysis 11-20 WBCs per high-power field.  Negative for proteinuria.  Urine sodium less than 10 urine osmolality 146    Trend in Creatinine: Creat  Date/Time Value Ref Range Status  08/02/2015 05:10 PM 0.85 0.50 - 1.05 mg/dL Final  08/04/2014 02:41 PM 0.90 0.50 - 1.10 mg/dL Final   Creatinine, Ser  Date/Time Value Ref Range Status  01/16/2020 06:59 AM 0.63 0.44 - 1.00 mg/dL Final  01/15/2020 08:39 PM 0.73 0.44 - 1.00 mg/dL Final  01/15/2020 03:41 PM 0.60 0.44 - 1.00 mg/dL Final  01/15/2020 10:40 AM 0.65 0.44 - 1.00 mg/dL Final  01/15/2020 01:28 AM 0.74 0.44 - 1.00 mg/dL Final  01/14/2020 07:45 AM 0.67 0.44 - 1.00 mg/dL Final  01/13/2020 08:07 AM 0.58 0.44 - 1.00 mg/dL Final  01/12/2020 04:52 AM 0.65 0.44 - 1.00 mg/dL Final  01/11/2020 12:49 PM 0.53 0.44 - 1.00 mg/dL Final  01/09/2020 08:57 AM 0.70 0.44 - 1.00 mg/dL Final  01/08/2020 08:03 AM 0.64 0.44 - 1.00 mg/dL Final  01/07/2020 11:01 PM 0.70 0.44 - 1.00 mg/dL Final  01/07/2020 10:35 PM 0.72 0.44 - 1.00 mg/dL Final  01/03/2020 02:58 AM 0.69 0.44 - 1.00 mg/dL Final  11/21/2019 04:05 AM 0.89 0.44 - 1.00 mg/dL  Final  11/20/2019 04:07 AM 0.78 0.44 - 1.00 mg/dL Final  11/19/2019 03:15 AM 0.89 0.44 - 1.00 mg/dL Final  11/18/2019 02:57 AM 0.90 0.44 - 1.00 mg/dL Final  11/17/2019 04:19 AM 0.85 0.44 - 1.00 mg/dL Final  11/16/2019 07:06 AM 0.70 0.44 - 1.00 mg/dL Final  11/15/2019 03:39 AM 0.75 0.44 - 1.00 mg/dL Final  11/14/2019 01:09 AM 0.94 0.44 - 1.00 mg/dL Final  11/13/2019 04:29 PM 1.00 0.44 - 1.00 mg/dL Final  11/13/2019 04:20 PM 0.95 0.44 - 1.00 mg/dL Final  08/04/2019 02:08 PM 0.60 0.44 - 1.00 mg/dL Final  08/04/2019 01:32 PM 0.77 0.44 - 1.00 mg/dL Final  05/24/2019 05:56 AM 0.73 0.44 - 1.00 mg/dL Final  05/23/2019 04:27 PM 0.74 0.44 - 1.00 mg/dL Final  05/22/2019 06:25 PM 0.60 0.44 - 1.00 mg/dL Final  05/22/2019 06:11 PM 0.64 0.44 - 1.00 mg/dL Final  02/10/2018 04:39 PM 0.95 0.57 - 1.00 mg/dL Final  07/07/2017 09:32 AM 0.85 0.57 - 1.00 mg/dL Final  09/25/2016 03:35 PM 0.91 0.57 - 1.00 mg/dL Final  06/15/2012 11:24 AM 0.77 0.50 - 1.10 mg/dL Final   Sodium  Date/Time Value Ref Range Status  01/16/2020 06:59 AM 126 (L) 135 - 145 mmol/L Final  01/15/2020 08:39 PM 124 (L) 135 - 145 mmol/L Final  01/15/2020 03:41 PM 127 (L) 135 - 145 mmol/L Final  01/15/2020 10:40 AM 125 (L) 135 - 145 mmol/L Final  01/15/2020 01:28 AM 126 (L) 135 - 145 mmol/L Final  01/14/2020 07:45 AM 129 (L) 135 - 145 mmol/L Final  01/13/2020 08:07 AM 129 (L) 135 - 145 mmol/L Final  01/12/2020 04:52 AM 130 (L) 135 - 145 mmol/L Final  01/11/2020 12:49 PM 128 (L) 135 - 145 mmol/L Final  01/09/2020 08:57 AM 130 (L) 135 - 145 mmol/L Final  01/07/2020 11:01 PM 129 (L) 135 - 145 mmol/L Final  01/07/2020 10:35 PM 130 (L) 135 - 145 mmol/L Final  01/03/2020 02:58 AM 129 (L) 135 - 145 mmol/L Final  11/21/2019 04:05 AM 134 (L) 135 - 145 mmol/L Final  11/20/2019 04:07 AM 135 135 - 145 mmol/L Final  11/19/2019 03:15 AM 135 135 - 145 mmol/L Final  11/18/2019 02:57 AM 133 (L) 135 - 145 mmol/L Final  11/17/2019 04:19 AM 132 (L) 135 -  145 mmol/L Final  11/16/2019 07:06 AM 134 (L) 135 - 145 mmol/L Final  11/15/2019 03:39 AM 131 (L) 135 - 145 mmol/L Final  11/14/2019 01:09 AM 133 (L) 135 - 145 mmol/L Final  11/13/2019 04:29 PM 133 (L) 135 - 145 mmol/L Final  11/13/2019 04:20 PM 132 (L) 135 - 145 mmol/L Final  08/04/2019 02:08 PM 131 (L) 135 - 145 mmol/L Final  08/04/2019 01:32 PM 133 (L) 135 - 145 mmol/L Final  05/24/2019 05:56 AM 137 135 - 145 mmol/L Final  05/23/2019 04:27 PM 137 135 - 145 mmol/L Final  05/22/2019 06:25 PM 136 135 - 145 mmol/L Final  05/22/2019 06:11 PM 130 (L) 135 - 145 mmol/L Final  02/10/2018 04:39 PM 142 134 - 144 mmol/L Final  07/07/2017 09:32 AM 142 134 - 144 mmol/L Final  09/25/2016 03:35 PM 143 134 - 144 mmol/L Final  08/02/2015 05:10 PM 140 135 - 146 mmol/L Final  08/04/2014 02:41 PM 140 135 - 145 mEq/L Final  06/15/2012 11:24 AM 141 135 - 145 mEq/L Final   PMH:   Past Medical History:  Diagnosis Date  . Arthritis   . Degenerative disc disease, lumbar   . GERD (gastroesophageal reflux disease)   . Hypertension   . OSA (obstructive sleep apnea)   . Prediabetes   . Seizures ([place])   . Urinary incontinence     PSH:   Past Surgical History:  Procedure Laterality Date  . ABDOMINAL HYSTERECTOMY    . BREAST EXCISIONAL BIOPSY Right   . BREAST SURGERY     right breast  . CESAREAN SECTION      Allergies:  Allergies  Allergen Reactions  . Aspirin Nausea And Vomiting  . Augmentin [Amoxicillin-Pot Clavulanate] Swelling and Other (See Comments)    Hand swelled at IV site    Medications:   Prior to Admission medications   Medication Sig Start Date End Date Taking? Authorizing Provider  acetaminophen (TYLENOL) 650 MG CR tablet Take 1,300 mg by mouth 2 (two) times daily as needed (arthritis pain).    Yes [provider]  aspirin EC 81 MG tablet Take 1 tablet (81 mg total) by mouth daily. 05/24/19 05/23/20 Yes Dessa Phi, DO  atorvastatin (LIPITOR) 80 MG tablet Take 1 tablet  (80 mg total) by mouth daily at 6 PM. Patient taking differently: Take 80 mg by mouth at bedtime.  05/24/19 01/11/20 Yes Dessa Phi, DO  b complex vitamins tablet Take 1 tablet by mouth daily.   Yes [provider]  Calcium Carb-Cholecalciferol (CALCIUM+D3 PO) Take 1 tablet by mouth daily with lunch.   Yes [provider]  glucosamine-chondroitin 500-400 MG tablet Take 1 tablet by mouth 3 (three) times daily. Patient taking differently: Take 2 tablets by mouth daily with lunch.  04/11/15  Yes Funches, Josalyn, MD  lisinopril (ZESTRIL) 5 MG tablet Take 5 mg by mouth at bedtime.  12/20/19  Yes [provider]  Multiple Vitamin (MULTIVITAMIN WITH MINERALS) TABS tablet Take 1 tablet by mouth daily with lunch.    Yes [provider]  Omega-3 Fatty Acids (FISH OIL) 1000 MG CAPS Take 1,000 mg by mouth daily with lunch.    Yes [provider]  OVER THE COUNTER MEDICATION Take 1 tablet by mouth See admin instructions. Unnamed tablet to treat heartburn: Take 1 tablet by mouth daily as needed for heartburn or reflux   Yes [provider]  oxybutynin (DITROPAN) 5 MG tablet Take 5 mg by mouth 3 (three) times daily. 12/14/19  Yes [provider]  predniSONE (DELTASONE) 20 MG tablet Take 20 mg by mouth 2 (two) times daily with a meal.  01/06/20  Yes [provider]  [place] into the lungs See admin instructions. CPAP- At bedtime   Yes [provider]  sulfamethoxazole-trimethoprim (BACTRIM DS) 800-160 MG tablet Take 1 tablet by mouth every Monday, Wednesday, and Friday.   Yes [provider]  amLODipine (NORVASC) 5 MG tablet Take 1 tablet (5 mg total) by mouth daily. Patient not taking: Reported on 01/11/2020 11/28/19   Oswald Hillock, MD  APPLE CIDER VINEGAR PO Take 2 capsules by mouth at bedtime. Patient not taking: Reported on 01/11/2020    [provider]  Cyanocobalamin (VITAMIN B-12 PO) Take 1  tablet by mouth daily.  Patient not taking: Reported on 01/11/2020    [provider]  pantoprazole (PROTONIX) 40 MG tablet Take 1 tablet (40 mg total) by mouth daily. Patient not taking: Reported on 01/11/2020 11/28/19   Oswald Hillock, MD    Discontinued Meds:   Medications Discontinued During This Encounter  Medication Reason  . ondansetron (ZOFRAN) injection 4 mg   . nitrofurantoin (macrocrystal-monohydrate) (MACROBID) capsule 100 mg   . methylPREDNISolone sodium succinate (SOLU-MEDROL) 500 mg in sodium chloride 0.9 % 50 mL IVPB   . methylPREDNISolone sodium succinate (SOLU-MEDROL) 500 mg in sodium chloride 0.9 % 50 mL IVPB   . 0.9 %  sodium chloride infusion   . predniSONE (DELTASONE) tablet 20 mg   . predniSONE (DELTASONE) tablet 20 mg   . pantoprazole (PROTONIX) injection 40 mg   . cefTRIAXone (ROCEPHIN) 1 g in sodium chloride 0.9 % 100 mL IVPB   . 0.9 %  sodium chloride infusion   . polyethylene glycol (MIRALAX / GLYCOLAX) packet 17 g   . lisinopril (ZESTRIL) tablet 5 mg   . 0.9 %  sodium chloride infusion   . insulin aspart (novoLOG) injection 0-6 Units   . 0.9 %  sodium chloride infusion     Social History:  reports that she has never smoked. She has never used smokeless tobacco. She reports that she does not drink alcohol and does not use drugs.  Family History:   Family History  Problem Relation Age of Onset  . Cancer Mother   . Hypertension Sister   . Diabetes Sister   . Diabetes Maternal Grandmother    Review of systems: General no fever sweats or chills Eyes no visual complaints Ears nose mouth throat no hearing loss epistaxis sore throat Cardiovascular no anginal chest pain no orthopnea no PND no ankle leg swelling Respiratory  no cough wheeze and obsess Abdominal system no abdominal pain nausea vomiting Urogenital no urgency frequency dysuria Neurologic history of seizures and neurosarcoidosis Endocrine no history of diabetes thyroid or adrenal  disease Dermatologic no skin rash or itching     Blood pressure (!) 154/110, pulse 70, temperature 98.4 F (36.9 C), temperature source Oral, resp. rate 18, height 5\' 3"  (1.6 m), weight 79.2 kg, SpO2 99 %.   General alert nondistressed Head and eyes normocephalic atraumatic pupils round and reactive Ears nose mouth throat external appearance normal oropharynx clear Neck supple no thyromegaly adenopathy JVP was not elevated Respiratory clear to auscultation and percussion Cardiovascular regular rate and rhythm no murmurs rubs gallops Abdominal system soft nontender bowel sounds present organosplenomegaly Extremities no cyanosis clubbing or edema Neurologic alert oriented following commands  Labs: Basic Metabolic Panel: Recent Labs  Lab 01/11/20 1249 01/12/20 0452 01/13/20 0807 01/14/20 0745 01/15/20 0128 01/15/20 1040 01/15/20 1541 01/15/20 2039 01/16/20 0659  NA 128*   < > 129* 129* 126* 125* 127* 124* 126*  K 4.0   < > 4.3 3.8 3.3* 4.5 5.5* 5.6* 4.7  CL 92*   < > 92* 93* 91* 91* 93* 93* 92*  CO2 24   < > 28 24 24 26 26 25 25   GLUCOSE 110*   < > 158* 171* 205* 168* 110* 180* 143*  BUN 10   < > 8 7 11 10 9 9 9   CREATININE 0.53   < > 0.58 0.67 0.74 0.65 0.60 0.73 0.63  ALBUMIN 3.1*  --   --   --   --   --   --   --   --   CALCIUM 8.9   < > 9.1 9.0 8.6* 8.4* 8.7* 8.2* 8.0*   < > = values in this interval not displayed.   Liver Function Tests: Recent Labs  Lab 01/11/20 1249  AST QUANTITY NOT SUFFICIENT, UNABLE TO PERFORM TEST  ALT QUANTITY NOT SUFFICIENT, UNABLE TO PERFORM TEST  ALKPHOS 53  BILITOT QUANTITY NOT SUFFICIENT, UNABLE TO PERFORM TEST  PROT 5.6*  ALBUMIN 3.1*   No results for input(s): LIPASE, AMYLASE in the last 168 hours. Recent Labs  Lab 01/11/20 1249  AMMONIA 13   CBC: Recent Labs  Lab 01/11/20 1249 01/12/20 0452 01/13/20 0807 01/15/20 0128  WBC 15.2* 14.9* 13.8* 19.0*  NEUTROABS 11.3*  --   --   --   HGB 13.4 12.9 13.4 12.4  HCT 45.3  39.7 41.4 37.7  MCV 96.0 88.2 88.1 87.5  PLT 207 245 244 239   PT/INR: @labrcntip (inr:5) Cardiac Enzymes: No results for input(s): CKTOTAL, CKMB, CKMBINDEX, TROPONINI in the last 168 hours. CBG: Recent Labs  Lab 01/11/20 1051 01/15/20 1212 01/15/20 1644 01/15/20 2103 01/16/20 0610  GLUCAP 120* 144* 161* 334* 148*    Iron Studies: No results for input(s): IRON, TIBC, TRANSFERRIN, FERRITIN in the last 168 hours.  Xrays/Other Studies: CT HEAD WO CONTRAST  Result Date: 01/16/2020 CLINICAL DATA:  Delirium. EXAM: CT HEAD WITHOUT CONTRAST TECHNIQUE: Contiguous axial images were obtained from the base of the skull through the vertex without intravenous contrast. COMPARISON:  Eight days ago FINDINGS: Brain: No evidence of acute infarction, hemorrhage, hydrocephalus, extra-axial collection or mass lesion/mass effect. Leptomeningeal disease by brain MRI is not apparent on this study. Remote left pontine perforator infarct. Vascular: Negative Skull: Normal. Negative for fracture or focal lesion. Sinuses/Orbits: There are a few opacified right mastoid air cells with expansion, stable. IMPRESSION: 1. Stable  head CT.  No acute or interval finding. 2. Remote pontine perforator infarct. Electronically Signed   By: Monte Fantasia M.D.   On: 01/16/2020 08:02   DG CHEST PORT 1 VIEW  Result Date: 01/14/2020 CLINICAL DATA:  Shortness of breath. EXAM: PORTABLE CHEST 1 VIEW COMPARISON:  Radiograph 01/11/2020. FINDINGS: Lung volumes are low.The cardiomediastinal contours are normal. Subsegmental atelectasis in the right greater than left lung base. Pulmonary vasculature is normal. No consolidation, pleural effusion, or pneumothorax. No acute osseous abnormalities are seen. IMPRESSION: Low lung volumes with bibasilar atelectasis. Electronically Signed   By: Keith Rake M.D.   On: 01/14/2020 17:36   ECHOCARDIOGRAM LIMITED  Result Date: 01/15/2020    ECHOCARDIOGRAM LIMITED REPORT   Patient Name:   Rhonda [name]  [facility] Date of Exam: 01/15/2020 Medical Rec #:  [id number]      Height:       63.0 in Accession #:    [id number]     Weight:       174.6 lb Date of Birth:  03/25/1961      BSA:          1.825 m Patient Age:    33 years       BP:           125/75 mmHg Patient Gender: F              HR:           67 bpm. Exam Location:  Inpatient Procedure: Limited Echo, Limited Color Doppler and Cardiac Doppler Indications:    abnormal ecg 794.31  History:        Patient has prior history of Echocardiogram examinations, most                 recent 11/14/2019. Risk Factors:Sleep Apnea, Dyslipidemia and                 Hypertension.  Sonographer:    Johny Chess Referring Phys: [phone] BELKYS A REGALADO IMPRESSIONS  1. Left ventricular ejection fraction, by estimation, is 65 to 70%. The left ventricle has normal function. The left ventricle has no regional wall motion abnormalities. Left ventricular diastolic parameters are indeterminate.  2. Right ventricular systolic function is normal. The right ventricular size is normal. There is normal pulmonary artery systolic pressure. The estimated right ventricular systolic pressure is 50.0 mmHg.  3. The mitral valve is grossly normal. Trivial mitral valve regurgitation.  4. The aortic valve is tricuspid. Aortic valve regurgitation is not visualized.  5. The inferior vena cava is normal in size with greater than 50% respiratory variability, suggesting right atrial pressure of 3 mmHg. FINDINGS  Left Ventricle: Left ventricular ejection fraction, by estimation, is 65 to 70%. The left ventricle has normal function. The left ventricle has no regional wall motion abnormalities. The left ventricular internal cavity size was normal in size. There is  borderline left ventricular hypertrophy. Right Ventricle: The right ventricular size is normal. No increase in right ventricular wall thickness. Right ventricular systolic function is normal. There is normal pulmonary artery systolic pressure. The  tricuspid regurgitant velocity is 2.40 m/s, and  with an assumed right atrial pressure of 3 mmHg, the estimated right ventricular systolic pressure is 93.8 mmHg. Left Atrium: Left atrial size was normal in size. Right Atrium: Right atrial size was normal in size. Pericardium: There is no evidence of pericardial effusion. Mitral Valve: The mitral valve is grossly normal. Trivial mitral valve regurgitation. Tricuspid Valve: The tricuspid valve is  grossly normal. Tricuspid valve regurgitation is trivial. Aortic Valve: The aortic valve is tricuspid. Aortic valve regurgitation is not visualized. Mild aortic valve annular calcification. Pulmonic Valve: The pulmonic valve was grossly normal. Pulmonic valve regurgitation is trivial. Aorta: The aortic root is normal in size and structure. Venous: The inferior vena cava is normal in size with greater than 50% respiratory variability, suggesting right atrial pressure of 3 mmHg. LEFT VENTRICLE PLAX 2D LVIDd:         4.50 cm  Diastology LVIDs:         2.60 cm  LV e' lateral:   7.40 cm/s LV PW:         1.00 cm  LV E/e' lateral: 11.7 LV IVS:        1.00 cm  LV e' medial:    5.55 cm/s LVOT diam:     1.90 cm  LV E/e' medial:  15.6 LV SV:         80 LV SV Index:   44 LVOT Area:     2.84 cm  AORTIC VALVE LVOT Vmax:   127.00 cm/s LVOT Vmean:  96.400 cm/s LVOT VTI:    0.281 m MITRAL VALVE               TRICUSPID VALVE MV Area (PHT): 2.56 cm    TR Peak grad:   23.0 mmHg MV Decel Time: 296 msec    TR Vmax:        240.00 cm/s MV E velocity: 86.50 cm/s MV A velocity: 87.00 cm/s  SHUNTS MV E/A ratio:  0.99        Systemic VTI:  0.28 m                            Systemic Diam: 1.90 cm Rozann Lesches MD Electronically signed by Rozann Lesches MD Signature Date/Time: 01/15/2020/11:34:12 AM    Final      Assessment/Plan:  1.  Hyponatremia   this appears to be hypoosmolar as well as low urine sodium.  This would be more consistent with low electrolyte intake.  Would therefore liberalize  salt and water intake.  This is not consistent with SIADH.  We will continue to follow   Sherril Croon 01/16/2020, 9:35 AM

## 2020-01-16 NOTE — Progress Notes (Signed)
NEUROLOGY CONSULTATION PROGRESS NOTE   Date of service: January 16, 2020 Patient Name: DEMECIA [name] MRN:  [id number] DOB:  [birth year]  Interval hx  Episode of repeating and stuttering speech overnight. CTH was negative. On discussion with husband, these exclusively happen when she is sleep deprived or very tired. She snapped out of it and is noe back to her baseline.  Past History   Past Medical History:  Diagnosis Date  . Arthritis   . Degenerative disc disease, lumbar   . GERD (gastroesophageal reflux disease)   . Hypertension   . OSA (obstructive sleep apnea)   . Prediabetes   . Seizures ([place])   . Urinary incontinence    Past Surgical History:  Procedure Laterality Date  . ABDOMINAL HYSTERECTOMY    . BREAST EXCISIONAL BIOPSY Right   . BREAST SURGERY     right breast  . CESAREAN SECTION     Family History  Problem Relation Age of Onset  . Cancer Mother   . Hypertension Sister   . Diabetes Sister   . Diabetes Maternal Grandmother    Social History   Socioeconomic History  . Marital status: Married    Spouse name: Not on file  . Number of children: Not on file  . Years of education: Not on file  . Highest education level: Not on file  Occupational History  . Not on file  Tobacco Use  . Smoking status: Never Smoker  . Smokeless tobacco: Never Used  Vaping Use  . Vaping Use: Never used  Substance and Sexual Activity  . Alcohol use: No  . Drug use: No  . Sexual activity: Yes    Birth control/protection: None  Other Topics Concern  . Not on file  Social History Narrative  . Not on file   Social Determinants of Health   Financial Resource Strain:   . Difficulty of Paying Living Expenses: Not on file  Food Insecurity: No Food Insecurity  . Worried About Charity fundraiser in the Last Year: Never true  . Ran Out of Food in the Last Year: Never true  Transportation Needs: No Transportation Needs  . Lack of Transportation (Medical): No  . Lack of  Transportation (Non-Medical): No  Physical Activity:   . Days of Exercise per Week: Not on file  . Minutes of Exercise per Session: Not on file  Stress:   . Feeling of Stress : Not on file  Social Connections: Socially Integrated  . Frequency of Communication with Friends and Family: More than three times a week  . Frequency of Social Gatherings with Friends and Family: More than three times a week  . Attends Religious Services: 1 to 4 times per year  . Active Member of Clubs or Organizations: Yes  . Attends Archivist Meetings: 1 to 4 times per year  . Marital Status: Married   Allergies  Allergen Reactions  . Aspirin Nausea And Vomiting  . Augmentin [Amoxicillin-Pot Clavulanate] Swelling and Other (See Comments)    Hand swelled at IV site    Medications   Medications Prior to Admission  Medication Sig Dispense Refill Last Dose  . acetaminophen (TYLENOL) 650 MG CR tablet Take 1,300 mg by mouth 2 (two) times daily as needed (arthritis pain).    unk at [employer]  . aspirin EC 81 MG tablet Take 1 tablet (81 mg total) by mouth daily. 30 tablet 11 01/11/2020 at 0800  . atorvastatin (LIPITOR) 80 MG tablet Take 1 tablet (80  mg total) by mouth daily at 6 PM. (Patient taking differently: Take 80 mg by mouth at bedtime. ) 30 tablet 2 01/10/2020 at PM  . b complex vitamins tablet Take 1 tablet by mouth daily.   01/11/2020 at Unknown time  . Calcium Carb-Cholecalciferol (CALCIUM+D3 PO) Take 1 tablet by mouth daily with lunch.   01/10/2020 at Unknown time  . glucosamine-chondroitin 500-400 MG tablet Take 1 tablet by mouth 3 (three) times daily. (Patient taking differently: Take 2 tablets by mouth daily with lunch. ) 90 tablet 2 Past Week at 1200  . lisinopril (ZESTRIL) 5 MG tablet Take 5 mg by mouth at bedtime.    01/10/2020 at PM  . Multiple Vitamin (MULTIVITAMIN WITH MINERALS) TABS tablet Take 1 tablet by mouth daily with lunch.    01/10/2020 at Unknown time  . Omega-3 Fatty Acids (FISH OIL)  1000 MG CAPS Take 1,000 mg by mouth daily with lunch.    01/10/2020 at Unknown time  . OVER THE COUNTER MEDICATION Take 1 tablet by mouth See admin instructions. Unnamed tablet to treat heartburn: Take 1 tablet by mouth daily as needed for heartburn or reflux   unk at [facility]  . oxybutynin (DITROPAN) 5 MG tablet Take 5 mg by mouth 3 (three) times daily.   01/11/2020 at AM  . predniSONE (DELTASONE) 20 MG tablet Take 20 mg by mouth 2 (two) times daily with a meal.    01/11/2020 at am  . PRESCRIPTION MEDICATION Inhale into the lungs See admin instructions. CPAP- At bedtime   01/10/2020 at pm  . sulfamethoxazole-trimethoprim (BACTRIM DS) 800-160 MG tablet Take 1 tablet by mouth every Monday, Wednesday, and Friday.   01/11/2020 at Unknown time  . amLODipine (NORVASC) 5 MG tablet Take 1 tablet (5 mg total) by mouth daily. (Patient not taking: Reported on 01/11/2020)   Not Taking at Unknown time  . APPLE CIDER VINEGAR PO Take 2 capsules by mouth at bedtime. (Patient not taking: Reported on 01/11/2020)   Not Taking at Unknown time  . Cyanocobalamin (VITAMIN B-12 PO) Take 1 tablet by mouth daily.  (Patient not taking: Reported on 01/11/2020)   Not Taking at Unknown time  . pantoprazole (PROTONIX) 40 MG tablet Take 1 tablet (40 mg total) by mouth daily. (Patient not taking: Reported on 01/11/2020)   Not Taking at Unknown time     Vitals  Temp:  [98 F (36.7 C)-98.9 F (37.2 C)] 98.4 F (36.9 C) (08/30 0750) Pulse Rate:  [66-86] 70 (08/30 0750) Resp:  [16-20] 18 (08/30 0750) BP: (116-154)/(68-110) 154/110 (08/30 0750) SpO2:  [98 %-100 %] 99 % (08/30 0750) FiO2 (%):  [2 %] 2 % (08/29 1213)  Body mass index is 30.93 kg/m.  Physical Exam   General: Laying comfortably in bed; in no acute distress.  HENT: Normal oropharynx and mucosa. Normal external appearance of ears and nose. Neck: Supple, no pain or tenderness CV: No JVD. No peripheral edema. Pulmonary: Symmetric Chest rise. Normal respiratory  effort. Abdomen: Soft to touch, non-tender Ext: No cyanosis, edema, or deformity  Skin: No rash. Normal palpation of skin.   Musculoskeletal: Normal digits and nails by inspection. No clubbing.  Neurologic Examination  Mental status/Cognition: Alert, oriented to self, place, month and year, good attention. Able to do calculations. Speech/language: Fluent, comprehension intact, object naming intact, repetition intact. Cranial nerves:   CN II Pupils equal and reactive to light, no VF deficits   CN III,IV,VI EOM intact, no gaze preference or deviation, no nystagmus  CN V normal sensation in V1, V2, and V3 segments bilaterally   CN VII no asymmetry, no nasolabial fold flattening   CN VIII normal hearing to speech   CN IX & X normal palatal elevation, no uvular deviation   CN XI 5/5 head turn and 5/5 shoulder shrug bilaterally   CN XII midline tongue protrusion   Motor:  Muscle bulk: normal, tone normal  BL upper extremities 5/5 to grip strength, patient eating food with no incoordination noted. BL lower extremities with BL hip flexion weakness, distal extremities stronger than proximal.  Sensation:  Light touch Intact throughout.   Pin prick    Temperature    Vibration   Proprioception    Coordination/Complex Motor:  - Finger to Nose intact BL  Labs   Hyponatremia.  Imaging and Diagnostic studies  CTH w/o C: IMPRESSION: 1. Stable head CT.  No acute or interval finding. 2. Remote pontine perforator infarct.  IMPRESSION: Unchanged leptomeningeal contrast enhancement over the cerebellum, brainstem and upper cervical spine compared to 11/19/19, consistent with neurosarcoidosis.  Impression   MAYANA IRIGOYEN is a 59 year old female with known neurosarcoidosis, sleep apnea, prior pontine stroke and other comorbidities. She presented for evaluation of stereotyped spells of AMS where she starts becoming a little irritated or agitated, repeating, suttering, becomes argumentative  and feels like she is losing her memory-has some recollection of those events but some other instances she does not recollect. These episodes are triggered by sleep deprivation. cEEG x 3 days with no epileptiform activity. MRI Brain with persistent leptomeningeal enhancement over cerebellum and brainstem and upper C spinal cord. Unclear if there is correlation between her symptoms and the noted enhancement. Perhaps involvement of the reticular activating system could explain these. She had another episode last night. Did not seem like seizure with no post ictal confusion. Suspect that her lack of sleep last night along with hyponatremia and episodes of hyperglycemia maybe contributing to these.  Recommendations  - Recommend correction of hyponatremia and glucose control. - I ordered Melatonin 3mg  daily - continue IV Methylprednisone 1G Q24 hours with last dose scheduled for 01/17/20. - Recommend starting Prednisone 60mg  daily PO starting 01/18/20 and continue at discharge. - Recommend MRI Brain with and without contrast about 6 week after discharge. If the enhancement is improving, can consider gradual taper of Prednisone PO. - Follow up with outpatient neurologist in 2-3 weeks. ______________________________________________________________________   Thank you for the opportunity to take part in the care of this patient. If you have any further questions, please contact the neurology consultation attending.  Signed,  [place] Pager Number [phone]

## 2020-01-16 NOTE — Progress Notes (Signed)
PROGRESS NOTE    Rhonda [name]  DGL:[id number] DOB: July 15, 1960 DOA: 01/11/2020 PCP: Bartholome Bill, MD   Brief Narrative: 59 year old-year-old with past medical history significant for prediabetes, neurosarcoidosis on chronic a steroid, history of partial seizure, hypertension, CVA and wheelchair-bound, OSA who presents with concern of altered mental status. Husband report her patient has been having staring spell and talking to him as if she is "sleepwalking" she follows at [facility] neurology as an outpatient and recently has been tapering her steroids to transition to CellCept.  Reported some nausea and vomiting and increased urinary frequency. ED physician discussed case with outpatient neurologist and high-dose steroid was recommended with outpatient follow-up. Patient continued to be lethargic and not back to baseline and she was admitted for further evaluation.  Neurology has been consulted.   Assessment & Plan:   Principal Problem:   AMS (altered mental status) Active Problems:   Essential hypertension   OSA (obstructive sleep apnea)   Leukocytosis   HLD (hyperlipidemia)   Acute lower UTI  Acute Metabolic encephalopathy:  Neurosarcoidosis vs seizure vs Infection.  Neurology following.  LTM EEG.. negative.  Discussed with neurology on 8/26, Dr Mendel Ryder plan to continue oral prednisone. Hold on starting IV steroids.  Evaluated by neurology 8/27, started IV High dose steroids, plan to received 3 days and reassess.  Improved Had an episode last night were she was confuse, slurred speech. Husband report this episodes happens when she is sleep deprive or she gets upset.  Patient is back to baseline now.   UTI; UA with 11-20 WBC Urine growing; E Coli Received  Ceftriaxone for 3 days. Plan to complete 5 days with Keflex.  Afebrile.   Hyponatremia;  Urine osmolality low, not consistent with SIADH.  Water restriction and continue with IV fluids.  Nephrology consulted.    Check B-met every 4 hours.   HTN; Hold lisinopril due to hyponatremia. Started BB for SVT  HLD;  Continue with statins.   Leukocytosis; on steroids. Follow.  Oral thrush; on nystatin.  Wide complex on telemetry, transient. Mg replaced.  Started low dose metoprolol.   OSA; CIPAP, encourage.   Dyspnea;  Oxygen was 100 ra Patient complaining of dyspnea this afternoon.  She has constipation, and abdominal distension.  Nebulizer PRN Chest x ray low lung volume. Incentive spirometry ordered.  Mild elevation troponin, similar to previous level. ECHO ordered D dimer not significantly elevated.  Resolved.   Constipation; enema, miralax ordered. Had good response./    Estimated body mass index is 30.93 kg/m as calculated from the following:   Height as of this encounter: $RemoveBeforeD'5\' 3"'[id number]$  (1.6 m).   Weight as of this encounter: 79.2 kg.   DVT prophylaxis: Lovenox Code Status: Full code Family Communication: no family at bedside, Husband  At bedside 8/30 Disposition Plan:  Status is: Inpatient  Remains inpatient appropriate because:Ongoing diagnostic testing needed not appropriate for outpatient work up   Dispo: The patient is from: Home              Anticipated d/c is to: Home              Anticipated d/c date is: 2 days              Patient currently is not medically stable to d/c.        Consultants:   Neurology   Procedures:   LTM EEG  Antimicrobials:    Subjective: She is alert, speech clear. Husband at bedside report patient  was awake all night, calling multiples family members. She get this episodes when she gets upset or sleep deprive.     Objective: Vitals:   01/15/20 2355 01/16/20 0401 01/16/20 0600 01/16/20 0750  BP: (!) 132/92 (!) 145/81 (!) 142/68 (!) 154/110  Pulse: 72 68 75 70  Resp: $Remo'16 16 20 18  'EoXQI$ Temp: 98.7 F (37.1 C) 98.5 F (36.9 C) 98.2 F (36.8 C) 98.4 F (36.9 C)  TempSrc: Oral Oral Oral Oral  SpO2: 100% 100% 98% 99%  Weight:       Height:        Intake/Output Summary (Last 24 hours) at 01/16/2020 0811 Last data filed at 01/16/2020 0422 Gross per 24 hour  Intake 2982.21 ml  Output 3200 ml  Net -217.79 ml   Filed Weights   01/11/20 1117 01/12/20 0113  Weight: 82 kg 79.2 kg    Examination:  General exam: NAD Respiratory system: CTA Cardiovascular system: S 1, S 2 RRR Gastrointestinal system: BS present, soft, nt Central nervous system: Alert  Data Reviewed: I have personally reviewed following labs and imaging studies  CBC: Recent Labs  Lab 01/09/20 0857 01/11/20 1249 01/12/20 0452 01/13/20 0807 01/15/20 0128  WBC 15.7* 15.2* 14.9* 13.8* 19.0*  NEUTROABS  --  11.3*  --   --   --   HGB 12.5 13.4 12.9 13.4 12.4  HCT 38.2 45.3 39.7 41.4 37.7  MCV 88.4 96.0 88.2 88.1 87.5  PLT 268 207 245 244 976   Basic Metabolic Panel: Recent Labs  Lab 01/09/20 0857 01/11/20 1249 01/14/20 0745 01/14/20 0745 01/15/20 0128 01/15/20 1040 01/15/20 1541 01/15/20 2039 01/16/20 0659  NA 130*   < > 129*   < > 126* 125* 127* 124* 126*  K 4.1   < > 3.8   < > 3.3* 4.5 5.5* 5.6* 4.7  CL 95*   < > 93*   < > 91* 91* 93* 93* 92*  CO2 26   < > 24   < > $R'24 26 26 25 25  'dP$ GLUCOSE 142*   < > 171*   < > 205* 168* 110* 180* 143*  BUN 9   < > 7   < > $R'11 10 9 9 9  'aS$ CREATININE 0.70   < > 0.67   < > 0.74 0.65 0.60 0.73 0.63  CALCIUM 8.7*   < > 9.0   < > 8.6* 8.4* 8.7* 8.2* 8.0*  MG 1.7  --  1.8  --  2.3  --   --   --   --    < > = values in this interval not displayed.   GFR: Estimated Creatinine Clearance: 76.4 mL/min (by C-G formula based on SCr of 0.63 mg/dL). Liver Function Tests: Recent Labs  Lab 01/11/20 1249  AST QUANTITY NOT SUFFICIENT, UNABLE TO PERFORM TEST  ALT QUANTITY NOT SUFFICIENT, UNABLE TO PERFORM TEST  ALKPHOS 31  BILITOT QUANTITY NOT SUFFICIENT, UNABLE TO PERFORM TEST  PROT 5.6*  ALBUMIN 3.1*   No results for input(s): LIPASE, AMYLASE in the last 168 hours. Recent Labs  Lab 01/11/20 1249   AMMONIA 13   Coagulation Profile: No results for input(s): INR, PROTIME in the last 168 hours. Cardiac Enzymes: No results for input(s): CKTOTAL, CKMB, CKMBINDEX, TROPONINI in the last 168 hours. BNP (last 3 results) No results for input(s): PROBNP in the last 8760 hours. HbA1C: No results for input(s): HGBA1C in the last 72 hours. CBG: Recent Labs  Lab 01/11/20 1051 01/15/20  1212 01/15/20 1644 01/15/20 2103 01/16/20 0610  GLUCAP 120* 144* 161* 334* 148*   Lipid Profile: No results for input(s): CHOL, HDL, LDLCALC, TRIG, CHOLHDL, LDLDIRECT in the last 72 hours. Thyroid Function Tests: No results for input(s): TSH, T4TOTAL, FREET4, T3FREE, THYROIDAB in the last 72 hours. Anemia Panel: No results for input(s): VITAMINB12, FOLATE, FERRITIN, TIBC, IRON, RETICCTPCT in the last 72 hours. Sepsis Labs: No results for input(s): PROCALCITON, LATICACIDVEN in the last 168 hours.  Recent Results (from the past 240 hour(s))  SARS Coronavirus 2 by RT PCR (hospital order, performed in [facility] hospital lab) Nasopharyngeal Nasopharyngeal Swab     Status: None   Collection Time: 01/08/20  2:06 AM   Specimen: Nasopharyngeal Swab  Result Value Ref Range Status   SARS Coronavirus 2 NEGATIVE NEGATIVE Final    Comment: (NOTE) SARS-CoV-2 target nucleic acids are NOT DETECTED.  The SARS-CoV-2 RNA is generally detectable in upper and lower respiratory specimens during the acute phase of infection. The lowest concentration of SARS-CoV-2 viral copies this assay can detect is 250 copies / mL. A negative result does not preclude SARS-CoV-2 infection and should not be used as the sole basis for treatment or other patient management decisions.  A negative result may occur with improper specimen collection / handling, submission of specimen other than nasopharyngeal swab, presence of viral mutation(s) within the areas targeted by this assay, and inadequate number of viral copies (<250 copies /  mL). A negative result must be combined with clinical observations, patient history, and epidemiological information.  Fact Sheet for Patients:   StrictlyIdeas.no  Fact Sheet for Healthcare Providers: BankingDealers.co.za  This test is not yet approved or  cleared by the [place] FDA and has been authorized for detection and/or diagnosis of SARS-CoV-2 by FDA under an Emergency Use Authorization (EUA).  This EUA will remain in effect (meaning this test can be used) for the duration of the COVID-19 declaration under Section 564(b)(1) of the Act, 21 U.S.C. section 360bbb-3(b)(1), unless the authorization is terminated or revoked sooner.  Performed at [facility], [place] [address] [place], [place] [address]   Urine culture     Status: Abnormal   Collection Time: 01/11/20  3:07 PM   Specimen: Urine, Random  Result Value Ref Range Status   Specimen Description URINE, RANDOM  Final   Special Requests   Final    NONE Performed at [facility], [place] [address] [place], [place] [address]    Culture >=100,000 COLONIES/mL ESCHERICHIA COLI (A)  Final   Report Status 01/13/2020 FINAL  Final   Organism ID, Bacteria ESCHERICHIA COLI (A)  Final      Susceptibility   Escherichia coli - MIC*    AMPICILLIN <=2 SENSITIVE Sensitive     CEFAZOLIN <=4 SENSITIVE Sensitive     CEFTRIAXONE <=0.25 SENSITIVE Sensitive     CIPROFLOXACIN <=0.25 SENSITIVE Sensitive     GENTAMICIN 4 SENSITIVE Sensitive     IMIPENEM <=0.25 SENSITIVE Sensitive     NITROFURANTOIN <=16 SENSITIVE Sensitive     TRIMETH/SULFA >=320 RESISTANT Resistant     AMPICILLIN/SULBACTAM <=2 SENSITIVE Sensitive     PIP/TAZO <=4 SENSITIVE Sensitive     * >=100,000 COLONIES/mL ESCHERICHIA COLI  SARS Coronavirus 2 by RT PCR (hospital order, performed in [facility] hospital lab) Nasopharyngeal Nasopharyngeal Swab     Status: None   Collection Time: 01/11/20 10:35 PM   Specimen:  Nasopharyngeal Swab  Result Value Ref Range Status  SARS Coronavirus 2 NEGATIVE NEGATIVE Final    Comment: (NOTE) SARS-CoV-2 target nucleic acids are NOT DETECTED.  The SARS-CoV-2 RNA is generally detectable in upper and lower respiratory specimens during the acute phase of infection. The lowest concentration of SARS-CoV-2 viral copies this assay can detect is 250 copies / mL. A negative result does not preclude SARS-CoV-2 infection and should not be used as the sole basis for treatment or other patient management decisions.  A negative result may occur with improper specimen collection / handling, submission of specimen other than nasopharyngeal swab, presence of viral mutation(s) within the areas targeted by this assay, and inadequate number of viral copies (<250 copies / mL). A negative result must be combined with clinical observations, patient history, and epidemiological information.  Fact Sheet for Patients:   StrictlyIdeas.no  Fact Sheet for Healthcare Providers: BankingDealers.co.za  This test is not yet approved or  cleared by the [place] FDA and has been authorized for detection and/or diagnosis of SARS-CoV-2 by FDA under an Emergency Use Authorization (EUA).  This EUA will remain in effect (meaning this test can be used) for the duration of the COVID-19 declaration under Section 564(b)(1) of the Act, 21 U.S.C. section 360bbb-3(b)(1), unless the authorization is terminated or revoked sooner.  Performed at [facility], [place] [address], [place], [place] [address]          Radiology Studies: CT HEAD WO CONTRAST  Result Date: 01/16/2020 CLINICAL DATA:  Delirium. EXAM: CT HEAD WITHOUT CONTRAST TECHNIQUE: Contiguous axial images were obtained from the base of the skull through the vertex without intravenous contrast. COMPARISON:  Eight days ago FINDINGS: Brain: No evidence of acute infarction, hemorrhage,  hydrocephalus, extra-axial collection or mass lesion/mass effect. Leptomeningeal disease by brain MRI is not apparent on this study. Remote left pontine perforator infarct. Vascular: Negative Skull: Normal. Negative for fracture or focal lesion. Sinuses/Orbits: There are a few opacified right mastoid air cells with expansion, stable. IMPRESSION: 1. Stable head CT.  No acute or interval finding. 2. Remote pontine perforator infarct. Electronically Signed   By: Monte Fantasia M.D.   On: 01/16/2020 08:02   DG CHEST PORT 1 VIEW  Result Date: 01/14/2020 CLINICAL DATA:  Shortness of breath. EXAM: PORTABLE CHEST 1 VIEW COMPARISON:  Radiograph 01/11/2020. FINDINGS: Lung volumes are low.The cardiomediastinal contours are normal. Subsegmental atelectasis in the right greater than left lung base. Pulmonary vasculature is normal. No consolidation, pleural effusion, or pneumothorax. No acute osseous abnormalities are seen. IMPRESSION: Low lung volumes with bibasilar atelectasis. Electronically Signed   By: Keith Rake M.D.   On: 01/14/2020 17:36   ECHOCARDIOGRAM LIMITED  Result Date: 01/15/2020    ECHOCARDIOGRAM LIMITED REPORT   Patient Name:   Rhonda [name] [facility] Date of Exam: 01/15/2020 Medical Rec #:  [id number]      Height:       63.0 in Accession #:    [id number]     Weight:       174.6 lb Date of Birth:  1960/11/21      BSA:          1.825 m Patient Age:    72 years       BP:           125/75 mmHg Patient Gender: F              HR:           67 bpm. Exam Location:  Inpatient Procedure: Limited Echo, Limited Color  Doppler and Cardiac Doppler Indications:    abnormal ecg 794.31  History:        Patient has prior history of Echocardiogram examinations, most                 recent 11/14/2019. Risk Factors:Sleep Apnea, Dyslipidemia and                 Hypertension.  Sonographer:    Johny Chess Referring Phys: [phone] Anias Bartol A Olamae Ferrara IMPRESSIONS  1. Left ventricular ejection fraction, by estimation, is 65 to 70%. The  left ventricle has normal function. The left ventricle has no regional wall motion abnormalities. Left ventricular diastolic parameters are indeterminate.  2. Right ventricular systolic function is normal. The right ventricular size is normal. There is normal pulmonary artery systolic pressure. The estimated right ventricular systolic pressure is 84.1 mmHg.  3. The mitral valve is grossly normal. Trivial mitral valve regurgitation.  4. The aortic valve is tricuspid. Aortic valve regurgitation is not visualized.  5. The inferior vena cava is normal in size with greater than 50% respiratory variability, suggesting right atrial pressure of 3 mmHg. FINDINGS  Left Ventricle: Left ventricular ejection fraction, by estimation, is 65 to 70%. The left ventricle has normal function. The left ventricle has no regional wall motion abnormalities. The left ventricular internal cavity size was normal in size. There is  borderline left ventricular hypertrophy. Right Ventricle: The right ventricular size is normal. No increase in right ventricular wall thickness. Right ventricular systolic function is normal. There is normal pulmonary artery systolic pressure. The tricuspid regurgitant velocity is 2.40 m/s, and  with an assumed right atrial pressure of 3 mmHg, the estimated right ventricular systolic pressure is 66.0 mmHg. Left Atrium: Left atrial size was normal in size. Right Atrium: Right atrial size was normal in size. Pericardium: There is no evidence of pericardial effusion. Mitral Valve: The mitral valve is grossly normal. Trivial mitral valve regurgitation. Tricuspid Valve: The tricuspid valve is grossly normal. Tricuspid valve regurgitation is trivial. Aortic Valve: The aortic valve is tricuspid. Aortic valve regurgitation is not visualized. Mild aortic valve annular calcification. Pulmonic Valve: The pulmonic valve was grossly normal. Pulmonic valve regurgitation is trivial. Aorta: The aortic root is normal in size and  structure. Venous: The inferior vena cava is normal in size with greater than 50% respiratory variability, suggesting right atrial pressure of 3 mmHg. LEFT VENTRICLE PLAX 2D LVIDd:         4.50 cm  Diastology LVIDs:         2.60 cm  LV e' lateral:   7.40 cm/s LV PW:         1.00 cm  LV E/e' lateral: 11.7 LV IVS:        1.00 cm  LV e' medial:    5.55 cm/s LVOT diam:     1.90 cm  LV E/e' medial:  15.6 LV SV:         80 LV SV Index:   44 LVOT Area:     2.84 cm  AORTIC VALVE LVOT Vmax:   127.00 cm/s LVOT Vmean:  96.400 cm/s LVOT VTI:    0.281 m MITRAL VALVE               TRICUSPID VALVE MV Area (PHT): 2.56 cm    TR Peak grad:   23.0 mmHg MV Decel Time: 296 msec    TR Vmax:        240.00 cm/s MV E velocity: 86.50 cm/s  MV A velocity: 87.00 cm/s  SHUNTS MV E/A ratio:  0.99        Systemic VTI:  0.28 m                            Systemic Diam: 1.90 cm Rozann Lesches MD Electronically signed by Rozann Lesches MD Signature Date/Time: 01/15/2020/11:34:12 AM    Final         Scheduled Meds: . aspirin EC  81 mg Oral Daily  . atorvastatin  80 mg Oral QHS  . cephALEXin  500 mg Oral Q8H  . enoxaparin (LOVENOX) injection  40 mg Subcutaneous Q24H  . feeding supplement (ENSURE ENLIVE)  237 mL Oral BID BM  . insulin aspart  0-3 Units Subcutaneous QHS  . insulin aspart  0-9 Units Subcutaneous TID WC  . metoprolol tartrate  25 mg Oral BID  . multivitamin with minerals  1 tablet Oral Daily  . nystatin  5 mL Oral QID  . omega-3 acid ethyl esters  1 g Oral Daily  . oxybutynin  5 mg Oral TID  . pantoprazole  40 mg Oral Q supper  . polyethylene glycol  17 g Oral BID   Continuous Infusions: . sodium chloride    . methylPREDNISolone (SOLU-MEDROL) injection 1,000 mg (01/15/20 1508)     LOS: 5 days    Time spent: 35 minutes,     Zaccheus Edmister A Loni Delbridge, MD [facility]   If 7PM-7AM, please contact night-coverage [email or web address]  01/16/2020, 8:11 AM

## 2020-01-16 NOTE — TOC Initial Note (Signed)
Transition of Care [facility]) - Initial/Assessment Note    Patient Details  Name: Rhonda [name] MRN: [id number] Date of Birth: 05-15-1961  [facility] CM/SW Contact:    Pollie Friar, RN Phone Number: 01/16/2020, 11:26 AM  Clinical Narrative:                 Pt is from home with spouse. She has all needed DME including CPAP. Pt has been attending outpatient therapy at [facility]. CM will send them needed updated information prior to d/c.  Pt denies issues with home medications or transportation.  TOC following.  Expected Discharge Plan: OP Rehab Barriers to Discharge: Continued Medical Work up   Patient Goals and CMS Choice     Choice offered to / list presented to : Patient  Expected Discharge Plan and Services Expected Discharge Plan: OP Rehab   Discharge Planning Services: CM Consult   Living arrangements for the past 2 months: Single Family Home Expected Discharge Date: 01/15/20                                    Prior Living Arrangements/Services Living arrangements for the past 2 months: Single Family Home Lives with:: Spouse, Minor Children Ambulance person) Patient language and need for interpreter reviewed:: Yes Do you feel safe going back to the place where you live?: Yes      Need for Family Participation in Patient Care: Yes (Comment) Care giver support system in place?: Yes (comment) (spouse able to provide 24 hour supervision.) Current home services: DME (walker, wheelchair, and bedside commode) Criminal Activity/Legal Involvement Pertinent to Current Situation/Hospitalization: No - Comment as needed  Activities of Daily Living Home Assistive Devices/Equipment: Wheelchair, Environmental consultant (specify type), Bedside commode/3-in-1, Eyeglasses, Dentures (specify type), Shower chair with back ADL Screening (condition at time of admission) Patient's cognitive ability adequate to safely complete daily activities?: Yes Is the patient deaf or have  difficulty hearing?: Yes Does the patient have difficulty seeing, even when wearing glasses/contacts?: No Does the patient have difficulty concentrating, remembering, or making decisions?: Yes Patient able to express need for assistance with ADLs?: Yes Does the patient have difficulty dressing or bathing?: Yes Independently performs ADLs?: Yes (appropriate for developmental age) Does the patient have difficulty walking or climbing stairs?: Yes Weakness of Legs: Right Weakness of Arms/Hands: Both  Permission Sought/Granted                  Emotional Assessment Appearance:: Appears stated age Attitude/Demeanor/Rapport: Engaged Affect (typically observed): Accepting Orientation: : Oriented to Self, Oriented to Place, Oriented to Situation, Oriented to  Time   Psych Involvement: No (comment)  Admission diagnosis:  Transient alteration of awareness [R40.4] AMS (altered mental status) [R41.82] Patient Active Problem List   Diagnosis Date Noted  . HLD (hyperlipidemia) 01/12/2020  . Acute lower UTI 01/12/2020  . AMS (altered mental status) 01/11/2020  . Memory changes 01/08/2020  . Altered mental status   . Hyponatremia   . Speech disturbance   . Demyelinating changes in brain ([place]) 11/13/2019  . Acute CVA (cerebrovascular accident) ([place]) 05/23/2019  . Transaminitis 05/23/2019  . Nausea and vomiting 05/23/2019  . Leukocytosis 05/23/2019  . Thoracic lymphadenopathy 08/26/2018  . Axillary lymphadenopathy 08/26/2018  . Piriformis syndrome of right side 09/29/2016  . OSA (obstructive sleep apnea) 09/27/2015  . Right hip pain 04/11/2015  . Essential hypertension 11/07/2014  . Chronic low back pain 11/07/2014  .  Prediabetes 11/07/2014  . Vitamin D deficiency 11/07/2014   PCP:  Bartholome Bill, MD Pharmacy:   [facility], [place] - [address] AT [place] [address] Phone: [phone]  Fax: [phone]  [facility] [place], [place] - [address] [place] [address] Phone: [phone] Fax: [phone]     Social Determinants of Health (SDOH) Interventions    Readmission Risk Interventions No flowsheet data found.

## 2020-01-17 LAB — BASIC METABOLIC PANEL
Anion gap: 8 (ref 5–15)
Anion gap: 9 (ref 5–15)
BUN: 12 mg/dL (ref 6–20)
BUN: 12 mg/dL (ref 6–20)
CO2: 27 mmol/L (ref 22–32)
CO2: 25 mmol/L (ref 22–32)
Calcium: 8.1 mg/dL — ABNORMAL LOW (ref 8.9–10.3)
Calcium: 8.3 mg/dL — ABNORMAL LOW (ref 8.9–10.3)
Chloride: 92 mmol/L — ABNORMAL LOW (ref 98–111)
Chloride: 94 mmol/L — ABNORMAL LOW (ref 98–111)
Creatinine, Ser: 0.82 mg/dL (ref 0.44–1.00)
Creatinine, Ser: 0.66 mg/dL (ref 0.44–1.00)
GFR calc Af Amer: >60 mL/min (ref >60)
GFR calc Af Amer: >60 mL/min (ref >60)
GFR calc non Af Amer: >60 mL/min (ref >60)
GFR calc non Af Amer: >60 mL/min (ref >60)
Glucose, Bld: 207 mg/dL — ABNORMAL HIGH (ref 70–99)
Glucose, Bld: 164 mg/dL — ABNORMAL HIGH (ref 70–99)
Potassium: 4.5 mmol/L (ref 3.5–5.1)
Potassium: 4.6 mmol/L (ref 3.5–5.1)
Sodium: 127 mmol/L — ABNORMAL LOW (ref 135–145)
Sodium: 128 mmol/L — ABNORMAL LOW (ref 135–145)

## 2020-01-17 LAB — GLUCOSE, CAPILLARY
Glucose-Capillary: 172 mg/dL — ABNORMAL HIGH (ref 70–99)
Glucose-Capillary: 205 mg/dL — ABNORMAL HIGH (ref 70–99)

## 2020-01-17 MED ORDER — POLYETHYLENE GLYCOL 3350 17 G PO PACK: 17.0000 g | PACK | Freq: Two times a day (BID) | ORAL | 0 refills | Status: AC

## 2020-01-17 MED ORDER — PREDNISONE 20 MG PO TABS: 60.0000 mg | ORAL_TABLET | Freq: Every day | ORAL | 0 refills | Status: AC

## 2020-01-17 MED ORDER — MELATONIN 3 MG PO TABS
3.0000 mg | ORAL_TABLET | Freq: Every day | ORAL | 1 refills | Status: DC
Start: 2020-01-17 — End: 2021-02-28

## 2020-01-17 MED ORDER — METOPROLOL TARTRATE 25 MG PO TABS
25.0000 mg | ORAL_TABLET | Freq: Two times a day (BID) | ORAL | 0 refills | Status: DC
Start: 2020-01-17 — End: 2020-09-29

## 2020-01-17 MED ORDER — PANTOPRAZOLE SODIUM 40 MG PO TBEC: 40.0000 mg | DELAYED_RELEASE_TABLET | Freq: Every day | ORAL | Status: AC

## 2020-01-17 MED ORDER — NYSTATIN 100000 UNIT/ML MT SUSP: 5.0000 mL | Freq: Four times a day (QID) | OROMUCOSAL | 0 refills | Status: DC

## 2020-01-17 NOTE — Progress Notes (Signed)
NEUROLOGY CONSULTATION PROGRESS NOTE   Date of service: January 17, 2020 Patient Name: Rhonda [name] MRN:  [id number] DOB:  08-Nov-1960  Interval hx   Feels like she can't sleep well in the hospital and is eager to get out. Had some nausea/vomiting yesterday. She asked if she could have the second dose of pfizer vaccine in the hospital.  She feels that her hearing out of left ear is better today. She never mentioned any problems with hearing in the past.  Past History   Past Medical History:  Diagnosis Date  . Arthritis   . Degenerative disc disease, lumbar   . GERD (gastroesophageal reflux disease)   . Hypertension   . OSA (obstructive sleep apnea)   . Prediabetes   . Seizures ([place])   . Urinary incontinence    Past Surgical History:  Procedure Laterality Date  . ABDOMINAL HYSTERECTOMY    . BREAST EXCISIONAL BIOPSY Right   . BREAST SURGERY     right breast  . CESAREAN SECTION     Family History  Problem Relation Age of Onset  . Cancer Mother   . Hypertension Sister   . Diabetes Sister   . Diabetes Maternal Grandmother    Social History   Socioeconomic History  . Marital status: Married    Spouse name: Not on file  . Number of children: Not on file  . Years of education: Not on file  . Highest education level: Not on file  Occupational History  . Not on file  Tobacco Use  . Smoking status: Never Smoker  . Smokeless tobacco: Never Used  Vaping Use  . Vaping Use: Never used  Substance and Sexual Activity  . Alcohol use: No  . Drug use: No  . Sexual activity: Yes    Birth control/protection: None  Other Topics Concern  . Not on file  Social History Narrative  . Not on file   Social Determinants of Health   Financial Resource Strain:   . Difficulty of Paying Living Expenses: Not on file  Food Insecurity: No Food Insecurity  . Worried About Charity fundraiser in the Last Year: Never true  . Ran Out of Food in the Last Year: Never true  Transportation  Needs: No Transportation Needs  . Lack of Transportation (Medical): No  . Lack of Transportation (Non-Medical): No  Physical Activity:   . Days of Exercise per Week: Not on file  . Minutes of Exercise per Session: Not on file  Stress:   . Feeling of Stress : Not on file  Social Connections: Socially Integrated  . Frequency of Communication with Friends and Family: More than three times a week  . Frequency of Social Gatherings with Friends and Family: More than three times a week  . Attends Religious Services: 1 to 4 times per year  . Active Member of Clubs or Organizations: Yes  . Attends Archivist Meetings: 1 to 4 times per year  . Marital Status: Married   Allergies  Allergen Reactions  . Aspirin Nausea And Vomiting  . Augmentin [Amoxicillin-Pot Clavulanate] Swelling and Other (See Comments)    Hand swelled at IV site    Medications   Medications Prior to Admission  Medication Sig Dispense Refill Last Dose  . acetaminophen (TYLENOL) 650 MG CR tablet Take 1,300 mg by mouth 2 (two) times daily as needed (arthritis pain).    unk at [employer]  . aspirin EC 81 MG tablet Take 1 tablet (81 mg  total) by mouth daily. 30 tablet 11 01/11/2020 at 0800  . atorvastatin (LIPITOR) 80 MG tablet Take 1 tablet (80 mg total) by mouth daily at 6 PM. (Patient taking differently: Take 80 mg by mouth at bedtime. ) 30 tablet 2 01/10/2020 at PM  . b complex vitamins tablet Take 1 tablet by mouth daily.   01/11/2020 at Unknown time  . Calcium Carb-Cholecalciferol (CALCIUM+D3 PO) Take 1 tablet by mouth daily with lunch.   01/10/2020 at Unknown time  . glucosamine-chondroitin 500-400 MG tablet Take 1 tablet by mouth 3 (three) times daily. (Patient taking differently: Take 2 tablets by mouth daily with lunch. ) 90 tablet 2 Past Week at 1200  . lisinopril (ZESTRIL) 5 MG tablet Take 5 mg by mouth at bedtime.    01/10/2020 at PM  . Multiple Vitamin (MULTIVITAMIN WITH MINERALS) TABS tablet Take 1 tablet by mouth  daily with lunch.    01/10/2020 at Unknown time  . Omega-3 Fatty Acids (FISH OIL) 1000 MG CAPS Take 1,000 mg by mouth daily with lunch.    01/10/2020 at Unknown time  . OVER THE COUNTER MEDICATION Take 1 tablet by mouth See admin instructions. Unnamed tablet to treat heartburn: Take 1 tablet by mouth daily as needed for heartburn or reflux   unk at [facility]  . oxybutynin (DITROPAN) 5 MG tablet Take 5 mg by mouth 3 (three) times daily.   01/11/2020 at AM  . predniSONE (DELTASONE) 20 MG tablet Take 20 mg by mouth 2 (two) times daily with a meal.    01/11/2020 at am  . PRESCRIPTION MEDICATION Inhale into the lungs See admin instructions. CPAP- At bedtime   01/10/2020 at pm  . sulfamethoxazole-trimethoprim (BACTRIM DS) 800-160 MG tablet Take 1 tablet by mouth every Monday, Wednesday, and Friday.   01/11/2020 at Unknown time  . amLODipine (NORVASC) 5 MG tablet Take 1 tablet (5 mg total) by mouth daily. (Patient not taking: Reported on 01/11/2020)   Not Taking at Unknown time  . APPLE CIDER VINEGAR PO Take 2 capsules by mouth at bedtime. (Patient not taking: Reported on 01/11/2020)   Not Taking at Unknown time  . Cyanocobalamin (VITAMIN B-12 PO) Take 1 tablet by mouth daily.  (Patient not taking: Reported on 01/11/2020)   Not Taking at Unknown time  . pantoprazole (PROTONIX) 40 MG tablet Take 1 tablet (40 mg total) by mouth daily. (Patient not taking: Reported on 01/11/2020)   Not Taking at Unknown time     Vitals  Temp:  [97.8 F (36.6 C)-99.7 F (37.6 C)] 98.5 F (36.9 C) (08/31 0806) Pulse Rate:  [62-76] 70 (08/31 0806) Resp:  [18-22] 20 (08/31 0806) BP: (127-145)/(74-91) 132/74 (08/31 0806) SpO2:  [99 %-100 %] 100 % (08/31 0806)  Body mass index is 30.93 kg/m.  Physical Exam   General: Laying comfortably in bed; in no acute distress.  HENT: Normal oropharynx and mucosa. Normal external appearance of ears and nose. Neck: Supple, no pain or tenderness CV: No JVD. No peripheral edema. Pulmonary: Symmetric  Chest rise. Normal respiratory effort. Abdomen: Soft to touch, non-tender Ext: No cyanosis, edema, or deformity  Skin: No rash. Normal palpation of skin.   Musculoskeletal: Normal digits and nails by inspection. No clubbing.  Neurologic Examination  Mental status/Cognition: Alert, oriented to self, place, month and year, good attention. Able to do calculations. Speech/language: Fluent, comprehension intact, object naming intact, repetition intact. Cranial nerves:   CN II Pupils equal and reactive to light, no VF deficits  CN III,IV,VI EOM intact, no gaze preference or deviation, no nystagmus   CN V normal sensation in V1, V2, and V3 segments bilaterally   CN VII no asymmetry, no nasolabial fold flattening   CN VIII normal hearing to speech   CN IX & X normal palatal elevation, no uvular deviation   CN XI 5/5 head turn and 5/5 shoulder shrug bilaterally   CN XII midline tongue protrusion   Motor:  Muscle bulk: normal, tone normal  BL upper extremities 5/5 to grip strength, patient eating food with no incoordination noted. BL lower extremities with BL hip flexion weakness 2-3/5 bilaterally, distal extremities 4-5/5 bilaterally. Uses wheelchair at baseline.  Sensation:  Light touch Intact throughout.   Pin prick    Temperature    Vibration   Proprioception    Coordination/Complex Motor:  - Finger to Nose intact BL  Labs   Hyponatremia.  Imaging and Diagnostic studies  CTH w/o C: IMPRESSION: 1. Stable head CT.  No acute or interval finding. 2. Remote pontine perforator infarct.  IMPRESSION: Unchanged leptomeningeal contrast enhancement over the cerebellum, brainstem and upper cervical spine compared to 11/19/19, consistent with neurosarcoidosis.  Impression   Rhonda [name] is a 59 year old female with known neurosarcoidosis, sleep apnea, prior pontine stroke and other comorbidities. She presented for evaluation of stereotyped spells of AMS where she starts becoming a  little irritated or agitated, repeating, suttering, becomes argumentative and feels like she is losing her memory-has some recollection of those events but some other instances she does not recollect. These episodes are triggered by sleep deprivation. cEEG x 3 days with no epileptiform activity. MRI Brain with persistent leptomeningeal enhancement over cerebellum and brainstem and upper C spinal cord. Unclear if there is correlation between her symptoms and the noted enhancement. Perhaps involvement of the reticular activating system could explain these.  Recommendations  - Can be discharged from a Neuro standpoint after her last dose of IV Methylprednisolone. - Recommend starting Prednisone 60mg  daily PO starting 01/18/20 and continue at discharge. - Recommend MRI Brain with and without contrast about 6 week after discharge. If the enhancement is improving, can consider gradual taper of Prednisone PO. - Follow up with outpatient neurologist in 2-3 weeks. ______________________________________________________________________   Thank you for the opportunity to take part in the care of this patient. If you have any further questions, please contact the neurology consultation attending.  Signed,  [place] Pager Number [phone]

## 2020-01-17 NOTE — TOC Transition Note (Signed)
Transition of Care [facility]) - CM/SW Discharge Note   Patient Details  Name: Rhonda [name] MRN: [id number] Date of Birth: 1960/12/22  [facility]) CM/SW Contact:  Pollie Friar, RN Phone Number: 01/17/2020, 12:15 PM   Clinical Narrative:    Pt discharging home with family. CM has sent resumption orders to her Outpatient therapy in [facility]. Pt has all needed DME.  Spouse to provide transport home.   Final next level of care: OP Rehab Barriers to Discharge: No Barriers Identified   Patient Goals and CMS Choice     Choice offered to / list presented to : Patient  Discharge Placement                       Discharge Plan and Services   Discharge Planning Services: CM Consult                                 Social Determinants of Health (SDOH) Interventions     Readmission Risk Interventions No flowsheet data found.

## 2020-01-17 NOTE — Progress Notes (Signed)
The patient is being discharged home.  The patent is being transported by her husband.  IV removed with the catheter intact.  Discharge instructions and prescription information given to the patient who verbalized understanding.

## 2020-01-17 NOTE — Plan of Care (Signed)
  Problem: Education: Goal: Knowledge of General Education information will improve Description Including pain rating scale, medication(s)/side effects and non-pharmacologic comfort measures Outcome: Progressing   

## 2020-01-17 NOTE — Plan of Care (Signed)
  Problem: Education: Goal: Knowledge of General Education information will improve Description: Including pain rating scale, medication(s)/side effects and non-pharmacologic comfort measures 01/17/2020 1133 by Caroll Rancher, RN Outcome: Adequate for Discharge 01/17/2020 0939 by Caroll Rancher, RN Outcome: Progressing   Problem: Nutrition: Goal: Adequate nutrition will be maintained 01/17/2020 1133 by Caroll Rancher, RN Outcome: Adequate for Discharge 01/17/2020 0939 by Caroll Rancher, RN Outcome: Progressing   Problem: Coping: Goal: Level of anxiety will decrease 01/17/2020 1133 by Caroll Rancher, RN Outcome: Adequate for Discharge 01/17/2020 0939 by Caroll Rancher, RN Outcome: Progressing

## 2020-01-17 NOTE — Progress Notes (Signed)
Patient placed on home CPAP by RN.

## 2020-01-17 NOTE — Discharge Summary (Addendum)
Physician Discharge Summary  Rhonda [name] MHD:[id number] DOB: 1960/11/30 DOA: 01/11/2020  PCP: Bartholome Bill, MD  Admit date: 01/11/2020 Discharge date: 01/17/2020  Admitted From:  Home  Disposition: Home   Recommendations for Outpatient Follow-up:  1. Follow up with PCP in 1-2 weeks 2. Please obtain BMP/CBC in one week 3. Follow up with PCP for B-met follow on hyponatremia.  4. Follow up with neurology within 2 weeks 5. Needs Brain  MRI in 6 weeks   Home Health: Resume [facility]  Discharge Condition: Stable.  CODE STATUS: Full code Diet recommendation: Heart Healthy / Carb Modified  Brief/Interim Summary: 59 year old-year-old with past medical history significant for prediabetes, neurosarcoidosis on chronic a steroid, history of partial seizure, hypertension, CVA and wheelchair-bound, OSA who presents with concern of altered mental status. Husband report her patient has been having staring spell and talking to him as if she is "sleepwalking" she follows at [facility] neurology as an outpatient and recently has been tapering her steroids to transition to CellCept.  Reported some nausea and vomiting and increased urinary frequency. ED physician discussed case with outpatient neurologist and high-dose steroid was recommended with outpatient follow-up. Patient continued to be lethargic and not back to baseline and she was admitted for further evaluation.  Neurology has been consulted.  Acute Metabolic encephalopathy:  Neurosarcoidosis vs seizure vs Infection.  Neurology following.  LTM EEG.. negative.  Discussed with neurology on 8/26, Dr Mendel Ryder plan to continue oral prednisone. Hold on starting IV steroids.  Evaluated by neurology 8/27, started IV High dose steroids, patient received 5 days of IV steroids.  Had an episode last night were she was confuse, slurred speech. Husband report this episodes happens when she is sleep deprive or she gets upset.  Patient is back to  baseline. Plan to discharge on prednisone 60 mg daily, follow up with neurology in 2 weeks. Needs MRI in 6 weeks.   UTI; UA with 11-20 WBC Urine growing; E Coli Received  Ceftriaxone for 3 days. Plan to complete 5 days total  with Keflex. Completed treatment in the hospital.  Afebrile.   Hyponatremia;  Urine osmolality low, not consistent with SIADH.  Water restriction and continue with IV fluids.  Nephrology consulted. Ok to discharge per nephrology report.  Sodium level improved to 128, asymptomatic. Liberalized sodium intake, water restriction.   HTN; Hold lisinopril due to hyponatremia. Started BB for SVT  HLD;  Continue with statins.   Leukocytosis; on steroids. Follow.  Oral thrush; on nystatin.   Wide complex on telemetry, transient. Mg replaced.  Started low dose metoprolol.   OSA; CIPAP, encourage.   Dyspnea;  Oxygen was 100 ra Patient complaining of dyspnea this afternoon.  She has constipation, and abdominal distension.  Nebulizer PRN Chest x ray low lung volume. Incentive spirometry ordered.  Mild elevation troponin, similar to previous level. ECHO ordered D dimer not significantly elevated.  Resolved.   Constipation; Enema, miralax ordered. Had good response./    Discharge Diagnoses:  Principal Problem:   AMS (altered mental status) Active Problems:   Essential hypertension   OSA (obstructive sleep apnea)   Leukocytosis   HLD (hyperlipidemia)   Acute lower UTI    Discharge Instructions  Discharge Instructions    Ambulatory referral to Physical Therapy   Complete by: As directed    Diet - low sodium heart healthy   Complete by: As directed    Diet - low sodium heart healthy   Complete by: As directed  Increase activity slowly   Complete by: As directed    Increase activity slowly   Complete by: As directed      Allergies as of 01/17/2020      Reactions   Aspirin Nausea And Vomiting   Augmentin [amoxicillin-pot Clavulanate]  Swelling, Other (See Comments)   Hand swelled at IV site      Medication List    STOP taking these medications   amLODipine 5 MG tablet Commonly known as: NORVASC   APPLE CIDER VINEGAR PO   lisinopril 5 MG tablet Commonly known as: ZESTRIL   VITAMIN B-12 PO     TAKE these medications   acetaminophen 650 MG CR tablet Commonly known as: TYLENOL Take 1,300 mg by mouth 2 (two) times daily as needed (arthritis pain).   aspirin EC 81 MG tablet Take 1 tablet (81 mg total) by mouth daily.   atorvastatin 80 MG tablet Commonly known as: LIPITOR Take 1 tablet (80 mg total) by mouth daily at 6 PM. What changed: when to take this   b complex vitamins tablet Take 1 tablet by mouth daily.   CALCIUM+D3 PO Take 1 tablet by mouth daily with lunch.   Fish Oil 1000 MG Caps Take 1,000 mg by mouth daily with lunch.   glucosamine-chondroitin 500-400 MG tablet Take 1 tablet by mouth 3 (three) times daily. What changed:   how much to take  when to take this   melatonin 3 MG Tabs tablet Take 1 tablet (3 mg total) by mouth at bedtime.   metoprolol tartrate 25 MG tablet Commonly known as: LOPRESSOR Take 1 tablet (25 mg total) by mouth 2 (two) times daily.   multivitamin with minerals Tabs tablet Take 1 tablet by mouth daily with lunch.   nystatin 100000 UNIT/ML suspension Commonly known as: MYCOSTATIN Take 5 mLs (500,000 Units total) by mouth 4 (four) times daily.   OVER THE COUNTER MEDICATION Take 1 tablet by mouth See admin instructions. Unnamed tablet to treat heartburn: Take 1 tablet by mouth daily as needed for heartburn or reflux   oxybutynin 5 MG tablet Commonly known as: DITROPAN Take 5 mg by mouth 3 (three) times daily.   pantoprazole 40 MG tablet Commonly known as: PROTONIX Take 1 tablet (40 mg total) by mouth daily.   polyethylene glycol 17 g packet Commonly known as: MIRALAX / GLYCOLAX Take 17 g by mouth 2 (two) times daily.   predniSONE 20 MG  tablet Commonly known as: Deltasone Take 3 tablets (60 mg total) by mouth daily. What changed:   how much to take  when to take this   [place] into the lungs See admin instructions. CPAP- At bedtime   sulfamethoxazole-trimethoprim 800-160 MG tablet Commonly known as: BACTRIM DS Take 1 tablet by mouth every Monday, Wednesday, and Friday.       Follow-up Information    Schedule an appointment as soon as possible for a visit  with Celene Squibb., MD.   Specialty: Neurology Contact information: [address] [place] [address] [phone]              Allergies  Allergen Reactions  . Aspirin Nausea And Vomiting  . Augmentin [Amoxicillin-Pot Clavulanate] Swelling and Other (See Comments)    Hand swelled at IV site    Consultations: Neurology   Procedures/Studies: DG Chest 1 View  Result Date: 01/11/2020 CLINICAL DATA:  Altered mental status EXAM: CHEST  1 VIEW COMPARISON:  11/13/2019 FINDINGS: Heart and mediastinal  contours are within normal limits. No focal opacities or effusions. No acute bony abnormality. IMPRESSION: No active disease. Electronically Signed   By: Rolm Baptise M.D.   On: 01/11/2020 20:36   CT HEAD WO CONTRAST  Result Date: 01/16/2020 CLINICAL DATA:  Delirium. EXAM: CT HEAD WITHOUT CONTRAST TECHNIQUE: Contiguous axial images were obtained from the base of the skull through the vertex without intravenous contrast. COMPARISON:  Eight days ago FINDINGS: Brain: No evidence of acute infarction, hemorrhage, hydrocephalus, extra-axial collection or mass lesion/mass effect. Leptomeningeal disease by brain MRI is not apparent on this study. Remote left pontine perforator infarct. Vascular: Negative Skull: Normal. Negative for fracture or focal lesion. Sinuses/Orbits: There are a few opacified right mastoid air cells with expansion, stable. IMPRESSION: 1. Stable head CT.  No acute or interval finding. 2. Remote pontine  perforator infarct. Electronically Signed   By: Monte Fantasia M.D.   On: 01/16/2020 08:02   CT HEAD WO CONTRAST  Result Date: 01/08/2020 CLINICAL DATA:  Seizure EXAM: CT HEAD WITHOUT CONTRAST TECHNIQUE: Contiguous axial images were obtained from the base of the skull through the vertex without intravenous contrast. COMPARISON:  01/03/2020 none FINDINGS: Brain: Normal anatomic configuration. Mild parenchymal volume loss is commensurate with the patient's age. No abnormal intra or extra-axial mass lesion or fluid collection. No abnormal mass effect or midline shift. No evidence of acute intracranial hemorrhage or infarct. Ventricular size is normal. Cerebellum unremarkable. Vascular: No asymmetric hyperdense vasculature at the skull base Skull: Intact Sinuses/Orbits: Minimal mucus within the right sphenoid sinus. Remaining paranasal sinuses are clear. Orbits are unremarkable. Other: Mastoid air cells and middle ear cavities are clear. IMPRESSION: 1. No evidence of acute intracranial abnormality. 2. Minimal right sphenoid sinus disease. Electronically Signed   By: Fidela Salisbury MD   On: 01/08/2020 01:39   CT Head Wo Contrast  Result Date: 01/03/2020 CLINICAL DATA:  Encephalopathy EXAM: CT HEAD WITHOUT CONTRAST TECHNIQUE: Contiguous axial images were obtained from the base of the skull through the vertex without intravenous contrast. COMPARISON:  11/13/2019 FINDINGS: Brain: There is no mass, hemorrhage or extra-axial collection. The size and configuration of the ventricles and extra-axial CSF spaces are normal. The brain parenchyma is normal, without acute or chronic infarction. Vascular: No abnormal hyperdensity of the major intracranial arteries or dural venous sinuses. No intracranial atherosclerosis. Skull: The visualized skull base, calvarium and extracranial soft tissues are normal. Sinuses/Orbits: No fluid levels or advanced mucosal thickening of the visualized paranasal sinuses. Small amount of right  mastoid fluid, unchanged. The orbits are normal. IMPRESSION: Normal head CT. Electronically Signed   By: Ulyses Jarred M.D.   On: 01/03/2020 03:27   MR Brain W and Wo Contrast  Result Date: 01/08/2020 CLINICAL DATA:  Brain mass, neurosarcoidosis EXAM: MRI HEAD WITHOUT AND WITH CONTRAST TECHNIQUE: Multiplanar, multiecho pulse sequences of the brain and surrounding structures were obtained without and with intravenous contrast. CONTRAST:  7.38mL GADAVIST GADOBUTROL 1 MMOL/ML IV SOLN COMPARISON:  11/19/2019 FINDINGS: Brain: There is leptomeningeal contrast enhancement over the cerebellum, brainstem and upper cervical spine. The distribution is unchanged from 11/19/2019 but remains markedly improved compared to 11/14/2019. Multifocal hyperintense T2-weighted signal in the white matter is unchanged. 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 No acute infarct or hemorrhage. Vascular: Normal flow voids Skull and upper cervical spine: Negative Sinuses/Orbits: Right mastoid effusion.  Normal orbits. Other: None IMPRESSION: Unchanged leptomeningeal contrast enhancement over the cerebellum, brainstem and upper cervical spine compared to 11/19/19, consistent with neurosarcoidosis. Electronically Signed   By:  Ulyses Jarred M.D.   On: 01/08/2020 05:06   DG CHEST PORT 1 VIEW  Result Date: 01/14/2020 CLINICAL DATA:  Shortness of breath. EXAM: PORTABLE CHEST 1 VIEW COMPARISON:  Radiograph 01/11/2020. FINDINGS: Lung volumes are low.The cardiomediastinal contours are normal. Subsegmental atelectasis in the right greater than left lung base. Pulmonary vasculature is normal. No consolidation, pleural effusion, or pneumothorax. No acute osseous abnormalities are seen. IMPRESSION: Low lung volumes with bibasilar atelectasis. Electronically Signed   By: Keith Rake M.D.   On: 01/14/2020 17:36   EEG adult  Result Date: 01/08/2020 Greta Doom, MD     01/08/2020  4:49 PM History: 59 year old female with neurosarcoidosis with three  episodes of transient memory loss Sedation: None Technique: This is a 21 channel routine scalp EEG performed at the bedside with bipolar and monopolar montages arranged in accordance to the international 10/20 system of electrode placement. One channel was dedicated to EKG recording. Background: The background consists of intermixed alpha and beta activities. There is a well defined posterior dominant rhythm of 8-9 hz that attenuates with eye opening. Sleep is recorded with normal appearing structures. Photic stimulation: Physiologic driving is not performed EEG Abnormalities: None Clinical Interpretation: This normal EEG is recorded in the waking and sleep state. There was no seizure or seizure predisposition recorded on this study. Please note that lack of epileptiform activity on EEG does not preclude the possibility of epilepsy. Roland Rack, MD [facility] [phone] If 7pm- 7am, please page neurology on call as listed in [place].   Overnight EEG with video  Result Date: 01/12/2020 Lora Havens, MD     01/13/2020 10:02 AM Patient Name: Rhonda [name] MRN: [id number] Epilepsy Attending: Lora Havens Referring Physician/Provider: Dr Kerney Elbe Duration: 01/11/2020 1751 to 01/12/2020 0141 to 01/12/2020 0816 to 01/12/2020 0000 Patient history: 59 year old with known neurosarcoidosis amongst other comorbidities, presenting with concerns for altered mental status and episodes/spells of varying levels of consciousness without any LOC or seizure-like activity. EEG to evaluate for seizure Level of alertness: Awake, asleep AEDs during EEG study: None Technical aspects: This EEG study was done with scalp electrodes positioned according to the 10-20 International system of electrode placement. Electrical activity was acquired at a sampling rate of $Remov'500Hz'cAKgTA$  and reviewed with a high frequency filter of $RemoveB'70Hz'PlBxsXnL$  and a low frequency filter of $RemoveB'1Hz'sMDjLOMu$ . EEG data were recorded continuously and digitally stored.  Description: The posterior dominant rhythm consists of 8 Hz activity of moderate voltage (25-35 uV) seen predominantly in posterior head regions, symmetric and reactive to eye opening and eye closing. Sleep was characterized by vertex waves, sleep spindles (12 to 14 Hz), maximal frontocentral region. Hyperventilation and photic stimulation were not performed.   IMPRESSION: This study is within normal limits. No seizures or epileptiform discharges were seen throughout the recording. Lora Havens   ECHOCARDIOGRAM LIMITED  Result Date: 01/15/2020    ECHOCARDIOGRAM LIMITED REPORT   Patient Name:   Rhonda [name] Date of Exam: 01/15/2020 Medical Rec #:  [id number]      Height:       63.0 in Accession #:    [id number]     Weight:       174.6 lb Date of Birth:  06-Jan-1961      BSA:          1.825 m Patient Age:    59 years       BP:           125/75 mmHg Patient  Gender: F              HR:           67 bpm. Exam Location:  Inpatient Procedure: Limited Echo, Limited Color Doppler and Cardiac Doppler Indications:    abnormal ecg 794.31  History:        Patient has prior history of Echocardiogram examinations, most                 recent 11/14/2019. Risk Factors:Sleep Apnea, Dyslipidemia and                 Hypertension.  Sonographer:    Johny Chess Referring Phys: [phone] Kaylani Fromme A Carlisha Wisler IMPRESSIONS  1. Left ventricular ejection fraction, by estimation, is 65 to 70%. The left ventricle has normal function. The left ventricle has no regional wall motion abnormalities. Left ventricular diastolic parameters are indeterminate.  2. Right ventricular systolic function is normal. The right ventricular size is normal. There is normal pulmonary artery systolic pressure. The estimated right ventricular systolic pressure is 62.2 mmHg.  3. The mitral valve is grossly normal. Trivial mitral valve regurgitation.  4. The aortic valve is tricuspid. Aortic valve regurgitation is not visualized.  5. The inferior vena cava is normal  in size with greater than 50% respiratory variability, suggesting right atrial pressure of 3 mmHg. FINDINGS  Left Ventricle: Left ventricular ejection fraction, by estimation, is 65 to 70%. The left ventricle has normal function. The left ventricle has no regional wall motion abnormalities. The left ventricular internal cavity size was normal in size. There is  borderline left ventricular hypertrophy. Right Ventricle: The right ventricular size is normal. No increase in right ventricular wall thickness. Right ventricular systolic function is normal. There is normal pulmonary artery systolic pressure. The tricuspid regurgitant velocity is 2.40 m/s, and  with an assumed right atrial pressure of 3 mmHg, the estimated right ventricular systolic pressure is 29.7 mmHg. Left Atrium: Left atrial size was normal in size. Right Atrium: Right atrial size was normal in size. Pericardium: There is no evidence of pericardial effusion. Mitral Valve: The mitral valve is grossly normal. Trivial mitral valve regurgitation. Tricuspid Valve: The tricuspid valve is grossly normal. Tricuspid valve regurgitation is trivial. Aortic Valve: The aortic valve is tricuspid. Aortic valve regurgitation is not visualized. Mild aortic valve annular calcification. Pulmonic Valve: The pulmonic valve was grossly normal. Pulmonic valve regurgitation is trivial. Aorta: The aortic root is normal in size and structure. Venous: The inferior vena cava is normal in size with greater than 50% respiratory variability, suggesting right atrial pressure of 3 mmHg. LEFT VENTRICLE PLAX 2D LVIDd:         4.50 cm  Diastology LVIDs:         2.60 cm  LV e' lateral:   7.40 cm/s LV PW:         1.00 cm  LV E/e' lateral: 11.7 LV IVS:        1.00 cm  LV e' medial:    5.55 cm/s LVOT diam:     1.90 cm  LV E/e' medial:  15.6 LV SV:         80 LV SV Index:   44 LVOT Area:     2.84 cm  AORTIC VALVE LVOT Vmax:   127.00 cm/s LVOT Vmean:  96.400 cm/s LVOT VTI:    0.281 m MITRAL  VALVE               TRICUSPID VALVE MV Area (  PHT): 2.56 cm    TR Peak grad:   23.0 mmHg MV Decel Time: 296 msec    TR Vmax:        240.00 cm/s MV E velocity: 86.50 cm/s MV A velocity: 87.00 cm/s  SHUNTS MV E/A ratio:  0.99        Systemic VTI:  0.28 m                            Systemic Diam: 1.90 cm Rozann Lesches MD Electronically signed by Rozann Lesches MD Signature Date/Time: 01/15/2020/11:34:12 AM    Final       Subjective: Feeling well. Alert and conversant.   Discharge Exam: Vitals:   01/17/20 0300 01/17/20 0806  BP: 131/84 132/74  Pulse: 69 70  Resp: 18 20  Temp: 97.8 F (36.6 C) 98.5 F (36.9 C)  SpO2: 100% 100%     General: Pt is alert, awake, not in acute distress Cardiovascular: RRR, S1/S2 +, no rubs, no gallops Respiratory: CTA bilaterally, no wheezing, no rhonchi Abdominal: Soft, NT, ND, bowel sounds + Extremities: no edema, no cyanosis    The results of significant diagnostics from this hospitalization (including imaging, microbiology, ancillary and laboratory) are listed below for reference.     Microbiology: Recent Results (from the past 240 hour(s))  SARS Coronavirus 2 by RT PCR (hospital order, performed in [facility] hospital lab) Nasopharyngeal Nasopharyngeal Swab     Status: None   Collection Time: 01/08/20  2:06 AM   Specimen: Nasopharyngeal Swab  Result Value Ref Range Status   SARS Coronavirus 2 NEGATIVE NEGATIVE Final    Comment: (NOTE) SARS-CoV-2 target nucleic acids are NOT DETECTED.  The SARS-CoV-2 RNA is generally detectable in upper and lower respiratory specimens during the acute phase of infection. The lowest concentration of SARS-CoV-2 viral copies this assay can detect is 250 copies / mL. A negative result does not preclude SARS-CoV-2 infection and should not be used as the sole basis for treatment or other patient management decisions.  A negative result may occur with improper specimen collection / handling, submission of  specimen other than nasopharyngeal swab, presence of viral mutation(s) within the areas targeted by this assay, and inadequate number of viral copies (<250 copies / mL). A negative result must be combined with clinical observations, patient history, and epidemiological information.  Fact Sheet for Patients:   StrictlyIdeas.no  Fact Sheet for Healthcare Providers: BankingDealers.co.za  This test is not yet approved or  cleared by the [place] FDA and has been authorized for detection and/or diagnosis of SARS-CoV-2 by FDA under an Emergency Use Authorization (EUA).  This EUA will remain in effect (meaning this test can be used) for the duration of the COVID-19 declaration under Section 564(b)(1) of the Act, 21 U.S.C. section 360bbb-3(b)(1), unless the authorization is terminated or revoked sooner.  Performed at [facility], [place] [address] [place], [place] [address]   Urine culture     Status: Abnormal   Collection Time: 01/11/20  3:07 PM   Specimen: Urine, Random  Result Value Ref Range Status   Specimen Description URINE, RANDOM  Final   Special Requests   Final    NONE Performed at [facility], [place] [address], [place], [place] [address]    Culture >=100,000 COLONIES/mL ESCHERICHIA COLI (A)  Final   Report Status 01/13/2020 FINAL  Final   Organism ID, Bacteria ESCHERICHIA COLI (A)  Final  Susceptibility   Escherichia coli - MIC*    AMPICILLIN <=2 SENSITIVE Sensitive     CEFAZOLIN <=4 SENSITIVE Sensitive     CEFTRIAXONE <=0.25 SENSITIVE Sensitive     CIPROFLOXACIN <=0.25 SENSITIVE Sensitive     GENTAMICIN 4 SENSITIVE Sensitive     IMIPENEM <=0.25 SENSITIVE Sensitive     NITROFURANTOIN <=16 SENSITIVE Sensitive     TRIMETH/SULFA >=320 RESISTANT Resistant     AMPICILLIN/SULBACTAM <=2 SENSITIVE Sensitive     PIP/TAZO <=4 SENSITIVE Sensitive     * >=100,000 COLONIES/mL ESCHERICHIA COLI  SARS Coronavirus 2  by RT PCR (hospital order, performed in [place] hospital lab) Nasopharyngeal Nasopharyngeal Swab     Status: None   Collection Time: 01/11/20 10:35 PM   Specimen: Nasopharyngeal Swab  Result Value Ref Range Status   SARS Coronavirus 2 NEGATIVE NEGATIVE Final    Comment: (NOTE) SARS-CoV-2 target nucleic acids are NOT DETECTED.  The SARS-CoV-2 RNA is generally detectable in upper and lower respiratory specimens during the acute phase of infection. The lowest concentration of SARS-CoV-2 viral copies this assay can detect is 250 copies / mL. A negative result does not preclude SARS-CoV-2 infection and should not be used as the sole basis for treatment or other patient management decisions.  A negative result may occur with improper specimen collection / handling, submission of specimen other than nasopharyngeal swab, presence of viral mutation(s) within the areas targeted by this assay, and inadequate number of viral copies (<250 copies / mL). A negative result must be combined with clinical observations, patient history, and epidemiological information.  Fact Sheet for Patients:   StrictlyIdeas.no  Fact Sheet for Healthcare Providers: BankingDealers.co.za  This test is not yet approved or  cleared by the [place] FDA and has been authorized for detection and/or diagnosis of SARS-CoV-2 by FDA under an Emergency Use Authorization (EUA).  This EUA will remain in effect (meaning this test can be used) for the duration of the COVID-19 declaration under Section 564(b)(1) of the Act, 21 U.S.C. section 360bbb-3(b)(1), unless the authorization is terminated or revoked sooner.  Performed at [facility], [place] [address], [place],  [address]      Labs: BNP (last 3 results) No results for input(s): BNP in the last 8760 hours. Basic Metabolic Panel: Recent Labs  Lab 01/14/20 0745 01/14/20 0745 01/15/20 0128  01/15/20 1040 01/16/20 1512 01/16/20 2014 01/16/20 2314 01/17/20 0259 01/17/20 0546  NA 129*   < > 126*   < > 128* 127* 128* 127* 128*  K 3.8   < > 3.3*   < > 4.4 4.5 4.6 4.5 4.6  CL 93*   < > 91*   < > 93* 94* 94* 92* 94*  CO2 24   < > 24   < > $R'25 25 24 27 25  'VB$ GLUCOSE 171*   < > 205*   < > 137* 110* 185* 207* 164*  BUN 7   < > 11   < > $R'11 11 11 12 12  'ah$ CREATININE 0.67   < > 0.74   < > 0.75 0.61 0.73 0.82 0.66  CALCIUM 9.0   < > 8.6*   < > 8.2* 8.2* 8.2* 8.1* 8.3*  MG 1.8  --  2.3  --   --   --   --   --   --    < > = values in this interval not displayed.   Liver Function Tests: Recent Labs  Lab 01/11/20 1249  AST QUANTITY NOT SUFFICIENT, UNABLE TO PERFORM TEST  ALT QUANTITY NOT SUFFICIENT, UNABLE TO PERFORM TEST  ALKPHOS 64  BILITOT QUANTITY NOT SUFFICIENT, UNABLE TO PERFORM TEST  PROT 5.6*  ALBUMIN 3.1*   No results for input(s): LIPASE, AMYLASE in the last 168 hours. Recent Labs  Lab 01/11/20 1249  AMMONIA 13   CBC: Recent Labs  Lab 01/11/20 1249 01/12/20 0452 01/13/20 0807 01/15/20 0128  WBC 15.2* 14.9* 13.8* 19.0*  NEUTROABS 11.3*  --   --   --   HGB 13.4 12.9 13.4 12.4  HCT 45.3 39.7 41.4 37.7  MCV 96.0 88.2 88.1 87.5  PLT 207 245 244 239   Cardiac Enzymes: No results for input(s): CKTOTAL, CKMB, CKMBINDEX, TROPONINI in the last 168 hours. BNP: Invalid input(s): POCBNP CBG: Recent Labs  Lab 01/16/20 0610 01/16/20 1146 01/16/20 1528 01/16/20 2110 01/17/20 0601  GLUCAP 148* 278* 146* 117* 172*   D-Dimer Recent Labs    01/15/20 1040  DDIMER 0.52*   Hgb A1c No results for input(s): HGBA1C in the last 72 hours. Lipid Profile No results for input(s): CHOL, HDL, LDLCALC, TRIG, CHOLHDL, LDLDIRECT in the last 72 hours. Thyroid function studies No results for input(s): TSH, T4TOTAL, T3FREE, THYROIDAB in the last 72 hours.  Invalid input(s): FREET3 Anemia work up No results for input(s): VITAMINB12, FOLATE, FERRITIN, TIBC, IRON, RETICCTPCT in  the last 72 hours. Urinalysis    Component Value Date/Time   COLORURINE AMBER (A) 01/11/2020 1507   APPEARANCEUR CLOUDY (A) 01/11/2020 1507   APPEARANCEUR Clear 02/10/2018 1639   LABSPEC 1.006 01/11/2020 1507   PHURINE 8.0 01/11/2020 1507   GLUCOSEU NEGATIVE 01/11/2020 1507   HGBUR SMALL (A) 01/11/2020 1507   BILIRUBINUR NEGATIVE 01/11/2020 1507   BILIRUBINUR Negative 02/10/2018 1639   KETONESUR NEGATIVE 01/11/2020 1507   PROTEINUR NEGATIVE 01/11/2020 1507   NITRITE NEGATIVE 01/11/2020 1507   LEUKOCYTESUR MODERATE (A) 01/11/2020 1507   Sepsis Labs Invalid input(s): PROCALCITONIN,  WBC,  LACTICIDVEN Microbiology Recent Results (from the past 240 hour(s))  SARS Coronavirus 2 by RT PCR (hospital order, performed in [place] hospital lab) Nasopharyngeal Nasopharyngeal Swab     Status: None   Collection Time: 01/08/20  2:06 AM   Specimen: Nasopharyngeal Swab  Result Value Ref Range Status   SARS Coronavirus 2 NEGATIVE NEGATIVE Final    Comment: (NOTE) SARS-CoV-2 target nucleic acids are NOT DETECTED.  The SARS-CoV-2 RNA is generally detectable in upper and lower respiratory specimens during the acute phase of infection. The lowest concentration of SARS-CoV-2 viral copies this assay can detect is 250 copies / mL. A negative result does not preclude SARS-CoV-2 infection and should not be used as the sole basis for treatment or other patient management decisions.  A negative result may occur with improper specimen collection / handling, submission of specimen other than nasopharyngeal swab, presence of viral mutation(s) within the areas targeted by this assay, and inadequate number of viral copies (<250 copies / mL). A negative result must be combined with clinical observations, patient history, and epidemiological information.  Fact Sheet for Patients:   StrictlyIdeas.no  Fact Sheet for Healthcare  Providers: BankingDealers.co.za  This test is not yet approved or  cleared by the [place] FDA and has been authorized for detection and/or diagnosis of SARS-CoV-2 by FDA under an Emergency Use Authorization (EUA).  This EUA will remain in effect (meaning this test can be used) for the duration of the COVID-19 declaration under Section 564(b)(1) of the Act, 21  U.S.C. section 360bbb-3(b)(1), unless the authorization is terminated or revoked sooner.  Performed at [facility], [place] [address] [place], [place] [address]   Urine culture     Status: Abnormal   Collection Time: 01/11/20  3:07 PM   Specimen: Urine, Random  Result Value Ref Range Status   Specimen Description URINE, RANDOM  Final   Special Requests   Final    NONE Performed at [facility], [place] [address] [place], [place] [address]    Culture >=100,000 COLONIES/mL ESCHERICHIA COLI (A)  Final   Report Status 01/13/2020 FINAL  Final   Organism ID, Bacteria ESCHERICHIA COLI (A)  Final      Susceptibility   Escherichia coli - MIC*    AMPICILLIN <=2 SENSITIVE Sensitive     CEFAZOLIN <=4 SENSITIVE Sensitive     CEFTRIAXONE <=0.25 SENSITIVE Sensitive     CIPROFLOXACIN <=0.25 SENSITIVE Sensitive     GENTAMICIN 4 SENSITIVE Sensitive     IMIPENEM <=0.25 SENSITIVE Sensitive     NITROFURANTOIN <=16 SENSITIVE Sensitive     TRIMETH/SULFA >=320 RESISTANT Resistant     AMPICILLIN/SULBACTAM <=2 SENSITIVE Sensitive     PIP/TAZO <=4 SENSITIVE Sensitive     * >=100,000 COLONIES/mL ESCHERICHIA COLI  SARS Coronavirus 2 by RT PCR (hospital order, performed in [place] hospital lab) Nasopharyngeal Nasopharyngeal Swab     Status: None   Collection Time: 01/11/20 10:35 PM   Specimen: Nasopharyngeal Swab  Result Value Ref Range Status   SARS Coronavirus 2 NEGATIVE NEGATIVE Final    Comment: (NOTE) SARS-CoV-2 target nucleic acids are NOT DETECTED.  The SARS-CoV-2 RNA is generally detectable in  upper and lower respiratory specimens during the acute phase of infection. The lowest concentration of SARS-CoV-2 viral copies this assay can detect is 250 copies / mL. A negative result does not preclude SARS-CoV-2 infection and should not be used as the sole basis for treatment or other patient management decisions.  A negative result may occur with improper specimen collection / handling, submission of specimen other than nasopharyngeal swab, presence of viral mutation(s) within the areas targeted by this assay, and inadequate number of viral copies (<250 copies / mL). A negative result must be combined with clinical observations, patient history, and epidemiological information.  Fact Sheet for Patients:   StrictlyIdeas.no  Fact Sheet for Healthcare Providers: BankingDealers.co.za  This test is not yet approved or  cleared by the [place] FDA and has been authorized for detection and/or diagnosis of SARS-CoV-2 by FDA under an Emergency Use Authorization (EUA).  This EUA will remain in effect (meaning this test can be used) for the duration of the COVID-19 declaration under Section 564(b)(1) of the Act, 21 U.S.C. section 360bbb-3(b)(1), unless the authorization is terminated or revoked sooner.  Performed at [facility], [place] [address], [place],  [address]      Time coordinating discharge: 40 minutes  SIGNED:   Elmarie Shiley, MD  [facility]

## 2020-01-17 NOTE — Progress Notes (Signed)
[facility] ROUNDING NOTE   Subjective:   Interval History:   59 y.o. female.  She has had hyperkalemia at least since March 2021.  She has relatively normal renal function.  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  She also has history of CVA and is wheelchair-bound.  Blood pressure 131/84 pulse 77 temperature 97.8 O2 sats 100% room air.  Urine output 3.1 L 01/16/2020  Sodium 128 potassium 4.6 chloride 94 CO2 25 BUN 12 creatinine 0.66 glucose 164 calcium 8.3.   Objective:  Vital signs in last 24 hours:  Temp:  [97.8 F (36.6 C)-99.7 F (37.6 C)] 97.8 F (36.6 C) (08/31 0300) Pulse Rate:  [62-76] 69 (08/31 0300) Resp:  [18-22] 18 (08/31 0300) BP: (127-154)/(80-110) 131/84 (08/31 0300) SpO2:  [99 %-100 %] 100 % (08/31 0300)  Weight change:  Filed Weights   01/11/20 1117 01/12/20 0113  Weight: 82 kg 79.2 kg    Intake/Output: I/O last 3 completed shifts: In: 3012.5 [P.O.:1308; I.V.:1146.5; IV Piggyback:558] Out: 5200 [Urine:5200]   Intake/Output this shift:  No intake/output data recorded.  CVS- RRR no murmurs rubs or gallops RS- CTA no wheeze or rales ABD- BS present soft non-distended EXT- no edema   Basic Metabolic Panel: Recent Labs  Lab 01/14/20 0745 01/14/20 0745 01/15/20 0128 01/15/20 1040 01/16/20 1512 01/16/20 1512 01/16/20 2014 01/16/20 2014 01/16/20 2314 01/17/20 0259 01/17/20 0546  NA 129*   < > 126*   < > 128*  --  127*  --  128* 127* 128*  K 3.8   < > 3.3*   < > 4.4  --  4.5  --  4.6 4.5 4.6  CL 93*   < > 91*   < > 93*  --  94*  --  94* 92* 94*  CO2 24   < > 24   < > 25  --  25  --  24 27 25   GLUCOSE 171*   < > 205*   < > 137*  --  110*  --  185* 207* 164*  BUN 7   < > 11   < > 11  --  11  --  11 12 12   CREATININE 0.67   < > 0.74   < > 0.75  --  0.61  --  0.73 0.82 0.66  CALCIUM 9.0   < > 8.6*   < > 8.2*   < > 8.2*   < > 8.2* 8.1* 8.3*  MG  1.8  --  2.3  --   --   --   --   --   --   --   --    < > = values in this interval not displayed.    Liver Function Tests: Recent Labs  Lab 01/11/20 1249  AST QUANTITY NOT SUFFICIENT, UNABLE TO PERFORM TEST  ALT QUANTITY NOT SUFFICIENT, UNABLE TO PERFORM TEST  ALKPHOS 58  BILITOT QUANTITY NOT SUFFICIENT, UNABLE TO PERFORM TEST  PROT 5.6*  ALBUMIN 3.1*   No results for input(s): LIPASE, AMYLASE in the last 168 hours. Recent Labs  Lab 01/11/20 1249  AMMONIA 13    CBC: Recent Labs  Lab 01/11/20 1249 01/12/20 0452 01/13/20 0807 01/15/20 0128  WBC 15.2* 14.9* 13.8* 19.0*  NEUTROABS 11.3*  --   --   --   HGB 13.4 12.9 13.4 12.4  HCT 45.3 39.7 41.4 37.7  MCV 96.0  88.2 88.1 87.5  PLT 207 245 244 239    Cardiac Enzymes: No results for input(s): CKTOTAL, CKMB, CKMBINDEX, TROPONINI in the last 168 hours.  BNP: Invalid input(s): POCBNP  CBG: Recent Labs  Lab 01/16/20 0610 01/16/20 1146 01/16/20 1528 01/16/20 2110 01/17/20 0601  GLUCAP 148* 278* 146* 117* 172*    Microbiology: Results for orders placed or performed during the hospital encounter of 01/11/20  Urine culture     Status: Abnormal   Collection Time: 01/11/20  3:07 PM   Specimen: Urine, Random  Result Value Ref Range Status   Specimen Description URINE, RANDOM  Final   Special Requests   Final    NONE Performed at [facility], [place] [address], [place], [place] [address]    Culture >=100,000 COLONIES/mL ESCHERICHIA COLI (A)  Final   Report Status 01/13/2020 FINAL  Final   Organism ID, Bacteria ESCHERICHIA COLI (A)  Final      Susceptibility   Escherichia coli - MIC*    AMPICILLIN <=2 SENSITIVE Sensitive     CEFAZOLIN <=4 SENSITIVE Sensitive     CEFTRIAXONE <=0.25 SENSITIVE Sensitive     CIPROFLOXACIN <=0.25 SENSITIVE Sensitive     GENTAMICIN 4 SENSITIVE Sensitive     IMIPENEM <=0.25 SENSITIVE Sensitive     NITROFURANTOIN <=16 SENSITIVE Sensitive     TRIMETH/SULFA >=320 RESISTANT  Resistant     AMPICILLIN/SULBACTAM <=2 SENSITIVE Sensitive     PIP/TAZO <=4 SENSITIVE Sensitive     * >=100,000 COLONIES/mL ESCHERICHIA COLI  SARS Coronavirus 2 by RT PCR (hospital order, performed in [place] hospital lab) Nasopharyngeal Nasopharyngeal Swab     Status: None   Collection Time: 01/11/20 10:35 PM   Specimen: Nasopharyngeal Swab  Result Value Ref Range Status   SARS Coronavirus 2 NEGATIVE NEGATIVE Final    Comment: (NOTE) SARS-CoV-2 target nucleic acids are NOT DETECTED.  The SARS-CoV-2 RNA is generally detectable in upper and lower respiratory specimens during the acute phase of infection. The lowest concentration of SARS-CoV-2 viral copies this assay can detect is 250 copies / mL. A negative result does not preclude SARS-CoV-2 infection and should not be used as the sole basis for treatment or other patient management decisions.  A negative result may occur with improper specimen collection / handling, submission of specimen other than nasopharyngeal swab, presence of viral mutation(s) within the areas targeted by this assay, and inadequate number of viral copies (<250 copies / mL). A negative result must be combined with clinical observations, patient history, and epidemiological information.  Fact Sheet for Patients:   StrictlyIdeas.no  Fact Sheet for Healthcare Providers: BankingDealers.co.za  This test is not yet approved or  cleared by the [place] FDA and has been authorized for detection and/or diagnosis of SARS-CoV-2 by FDA under an Emergency Use Authorization (EUA).  This EUA will remain in effect (meaning this test can be used) for the duration of the COVID-19 declaration under Section 564(b)(1) of the Act, 21 U.S.C. section 360bbb-3(b)(1), unless the authorization is terminated or revoked sooner.  Performed at [facility], [place] [address], [place], [place] [address]     Coagulation  Studies: No results for input(s): LABPROT, INR in the last 72 hours.  Urinalysis: No results for input(s): COLORURINE, LABSPEC, PHURINE, GLUCOSEU, HGBUR, BILIRUBINUR, KETONESUR, PROTEINUR, UROBILINOGEN, NITRITE, LEUKOCYTESUR in the last 72 hours.  Invalid input(s): APPERANCEUR    Imaging: CT HEAD WO CONTRAST  Result Date: 01/16/2020 CLINICAL DATA:  Delirium. EXAM: CT HEAD WITHOUT  CONTRAST TECHNIQUE: Contiguous axial images were obtained from the base of the skull through the vertex without intravenous contrast. COMPARISON:  Eight days ago FINDINGS: Brain: No evidence of acute infarction, hemorrhage, hydrocephalus, extra-axial collection or mass lesion/mass effect. Leptomeningeal disease by brain MRI is not apparent on this study. Remote left pontine perforator infarct. Vascular: Negative Skull: Normal. Negative for fracture or focal lesion. Sinuses/Orbits: There are a few opacified right mastoid air cells with expansion, stable. IMPRESSION: 1. Stable head CT.  No acute or interval finding. 2. Remote pontine perforator infarct. Electronically Signed   By: Monte Fantasia M.D.   On: 01/16/2020 08:02   ECHOCARDIOGRAM LIMITED  Result Date: 01/15/2020    ECHOCARDIOGRAM LIMITED REPORT   Patient Name:   Rhonda [name] [facility] Date of Exam: 01/15/2020 Medical Rec #:  [id number]      Height:       63.0 in Accession #:    [id number]     Weight:       174.6 lb Date of Birth:  02/26/1961      BSA:          1.825 m Patient Age:    74 years       BP:           125/75 mmHg Patient Gender: F              HR:           67 bpm. Exam Location:  Inpatient Procedure: Limited Echo, Limited Color Doppler and Cardiac Doppler Indications:    abnormal ecg 794.31  History:        Patient has prior history of Echocardiogram examinations, most                 recent 11/14/2019. Risk Factors:Sleep Apnea, Dyslipidemia and                 Hypertension.  Sonographer:    Johny Chess Referring Phys: [id number] BELKYS A REGALADO IMPRESSIONS  1.  Left ventricular ejection fraction, by estimation, is 65 to 70%. The left ventricle has normal function. The left ventricle has no regional wall motion abnormalities. Left ventricular diastolic parameters are indeterminate.  2. Right ventricular systolic function is normal. The right ventricular size is normal. There is normal pulmonary artery systolic pressure. The estimated right ventricular systolic pressure is 03.4 mmHg.  3. The mitral valve is grossly normal. Trivial mitral valve regurgitation.  4. The aortic valve is tricuspid. Aortic valve regurgitation is not visualized.  5. The inferior vena cava is normal in size with greater than 50% respiratory variability, suggesting right atrial pressure of 3 mmHg. FINDINGS  Left Ventricle: Left ventricular ejection fraction, by estimation, is 65 to 70%. The left ventricle has normal function. The left ventricle has no regional wall motion abnormalities. The left ventricular internal cavity size was normal in size. There is  borderline left ventricular hypertrophy. Right Ventricle: The right ventricular size is normal. No increase in right ventricular wall thickness. Right ventricular systolic function is normal. There is normal pulmonary artery systolic pressure. The tricuspid regurgitant velocity is 2.40 m/s, and  with an assumed right atrial pressure of 3 mmHg, the estimated right ventricular systolic pressure is 74.2 mmHg. Left Atrium: Left atrial size was normal in size. Right Atrium: Right atrial size was normal in size. Pericardium: There is no evidence of pericardial effusion. Mitral Valve: The mitral valve is grossly normal. Trivial mitral valve regurgitation. Tricuspid Valve: The tricuspid valve is grossly normal.  Tricuspid valve regurgitation is trivial. Aortic Valve: The aortic valve is tricuspid. Aortic valve regurgitation is not visualized. Mild aortic valve annular calcification. Pulmonic Valve: The pulmonic valve was grossly normal. Pulmonic valve  regurgitation is trivial. Aorta: The aortic root is normal in size and structure. Venous: The inferior vena cava is normal in size with greater than 50% respiratory variability, suggesting right atrial pressure of 3 mmHg. LEFT VENTRICLE PLAX 2D LVIDd:         4.50 cm  Diastology LVIDs:         2.60 cm  LV e' lateral:   7.40 cm/s LV PW:         1.00 cm  LV E/e' lateral: 11.7 LV IVS:        1.00 cm  LV e' medial:    5.55 cm/s LVOT diam:     1.90 cm  LV E/e' medial:  15.6 LV SV:         80 LV SV Index:   44 LVOT Area:     2.84 cm  AORTIC VALVE LVOT Vmax:   127.00 cm/s LVOT Vmean:  96.400 cm/s LVOT VTI:    0.281 m MITRAL VALVE               TRICUSPID VALVE MV Area (PHT): 2.56 cm    TR Peak grad:   23.0 mmHg MV Decel Time: 296 msec    TR Vmax:        240.00 cm/s MV E velocity: 86.50 cm/s MV A velocity: 87.00 cm/s  SHUNTS MV E/A ratio:  0.99        Systemic VTI:  0.28 m                            Systemic Diam: 1.90 cm Rozann Lesches MD Electronically signed by Rozann Lesches MD Signature Date/Time: 01/15/2020/11:34:12 AM    Final      Medications:    sodium chloride 100 mL/hr at 01/16/20 0834   methylPREDNISolone (SOLU-MEDROL) injection 1,000 mg (01/16/20 0836)    aspirin EC  81 mg Oral Daily   atorvastatin  80 mg Oral QHS   enoxaparin (LOVENOX) injection  40 mg Subcutaneous Q24H   feeding supplement (ENSURE ENLIVE)  237 mL Oral BID BM   insulin aspart  0-3 Units Subcutaneous QHS   insulin aspart  0-9 Units Subcutaneous TID WC   melatonin  3 mg Oral QHS   metoprolol tartrate  25 mg Oral BID   multivitamin with minerals  1 tablet Oral Daily   nystatin  5 mL Oral QID   omega-3 acid ethyl esters  1 g Oral Daily   oxybutynin  5 mg Oral TID   pantoprazole  40 mg Oral Q supper   polyethylene glycol  17 g Oral BID   hydrALAZINE, ipratropium-albuterol, nitroGLYCERIN, ondansetron, sodium phosphate  Assessment/ Plan:   Hyponatremia.  Urine studies indicate this is consistent with poor  electrolyte intake.  Sodium appears to be improving slowly.  Continue to increase electrolytes in diet.  This is not SIADH as the labs are inconsistent with this.  We will sign off today please reconsult if further help is needed   LOS: [place] @TODAY @7 :36 AM

## 2020-04-29 ENCOUNTER — Emergency Department (HOSPITAL_COMMUNITY)
Admission: EM | Admit: 2020-04-29 | Discharge: 2020-04-30 | Disposition: A | Discharge status: Home / Self Care | Payer: BLUE CROSS/BLUE SHIELD | Attending: Emergency Medicine | Admitting: Emergency Medicine

## 2020-04-29 ENCOUNTER — Other Ambulatory Visit: Payer: Self-pay

## 2020-04-29 ENCOUNTER — Encounter (HOSPITAL_COMMUNITY): Payer: Self-pay | Admitting: *Deleted

## 2020-04-29 DIAGNOSIS — R040 Epistaxis: Secondary | ICD-10-CM | POA: Diagnosis not present

## 2020-04-29 DIAGNOSIS — Z5321 Procedure and treatment not carried out due to patient leaving prior to being seen by health care provider: Secondary | ICD-10-CM | POA: Insufficient documentation

## 2020-04-29 NOTE — ED Triage Notes (Signed)
The pt has sarcoidosis this am  She had a nosebleed  That stopped and again this afternoon she had a second nose bleed  Her nose is not bleeding at present  She takes aspirin 81 mg per day  alert

## 2020-04-30 NOTE — ED Notes (Signed)
Pt states she is leaving. Will return if needed.

## 2020-05-06 ENCOUNTER — Emergency Department (HOSPITAL_COMMUNITY)
Admission: EM | Admit: 2020-05-06 | Discharge: 2020-05-06 | Disposition: A | Discharge status: Home / Self Care | Payer: BLUE CROSS/BLUE SHIELD | Attending: Emergency Medicine | Admitting: Emergency Medicine

## 2020-05-06 ENCOUNTER — Other Ambulatory Visit: Payer: Self-pay

## 2020-05-06 ENCOUNTER — Encounter (HOSPITAL_COMMUNITY): Payer: Self-pay | Admitting: Emergency Medicine

## 2020-05-06 ENCOUNTER — Emergency Department (HOSPITAL_COMMUNITY): Payer: BLUE CROSS/BLUE SHIELD

## 2020-05-06 DIAGNOSIS — R531 Weakness: Secondary | ICD-10-CM | POA: Diagnosis not present

## 2020-05-06 DIAGNOSIS — Z7982 Long term (current) use of aspirin: Secondary | ICD-10-CM | POA: Diagnosis not present

## 2020-05-06 DIAGNOSIS — R410 Disorientation, unspecified: Secondary | ICD-10-CM | POA: Diagnosis not present

## 2020-05-06 DIAGNOSIS — Z79899 Other long term (current) drug therapy: Secondary | ICD-10-CM | POA: Insufficient documentation

## 2020-05-06 DIAGNOSIS — R519 Headache, unspecified: Secondary | ICD-10-CM

## 2020-05-06 DIAGNOSIS — H538 Other visual disturbances: Secondary | ICD-10-CM | POA: Diagnosis not present

## 2020-05-06 DIAGNOSIS — I10 Essential (primary) hypertension: Secondary | ICD-10-CM | POA: Insufficient documentation

## 2020-05-06 HISTORY — DX: Sarcoidosis of other sites: D86.89

## 2020-05-06 LAB — CBC
HCT: 35.9 % — ABNORMAL LOW (ref 36.0–46.0)
Hemoglobin: 11.8 g/dL — ABNORMAL LOW (ref 12.0–15.0)
MCH: 30.7 pg (ref 26.0–34.0)
MCHC: 32.9 g/dL (ref 30.0–36.0)
MCV: 93.5 fL (ref 80.0–100.0)
Platelets: 204 10*3/uL (ref 150–400)
RBC: 3.84 MIL/uL — ABNORMAL LOW (ref 3.87–5.11)
RDW: 16.7 % — ABNORMAL HIGH (ref 11.5–15.5)
WBC: 12.2 10*3/uL — ABNORMAL HIGH (ref 4.0–10.5)
nRBC: 0.2 % (ref 0.0–0.2)

## 2020-05-06 LAB — BASIC METABOLIC PANEL
Anion gap: 8 (ref 5–15)
BUN: 8 mg/dL (ref 6–20)
CO2: 29 mmol/L (ref 22–32)
Calcium: 8.7 mg/dL — ABNORMAL LOW (ref 8.9–10.3)
Chloride: 89 mmol/L — ABNORMAL LOW (ref 98–111)
Creatinine, Ser: 0.63 mg/dL (ref 0.44–1.00)
GFR, Estimated: >60 mL/min (ref >60)
Glucose, Bld: 198 mg/dL — ABNORMAL HIGH (ref 70–99)
Potassium: 4.2 mmol/L (ref 3.5–5.1)
Sodium: 126 mmol/L — ABNORMAL LOW (ref 135–145)

## 2020-05-06 NOTE — Discharge Instructions (Signed)
Please read and follow all provided instructions.  Your diagnoses today include:  1. Acute nonintractable headache, unspecified headache type     Tests performed today include:  CT of your head which was normal and did not show any serious cause of your headache Blood cell counts (white, red, and platelets) - slightly high white blood cells and slightly low red blood cells Electrolytes - chronically low sodium Kidney function test  Vital signs. See below for your results today.   Medications:   None  Take any prescribed medications only as directed.  Additional information:  Follow any educational materials contained in this packet.  You are having a headache. No specific cause was found today for your headache. It may have been a migraine or other cause of headache. Stress, anxiety, fatigue, and depression are common triggers for headaches.   Your headache today does not appear to be life-threatening or require hospitalization, but often the exact cause of headaches is not determined in the emergency department. Therefore, follow-up with your doctor is very important to find out what may have caused your headache and whether or not you need any further diagnostic testing or treatment.   Sometimes headaches can appear benign (not harmful), but then more serious symptoms can develop which should prompt an immediate re-evaluation by your doctor or the emergency department.  BE VERY CAREFUL not to take multiple medicines containing Tylenol (also called acetaminophen). Doing so can lead to an overdose which can damage your liver and cause liver failure and possibly death.   Follow-up instructions: Please message your neurologist tomorrow and let them know what is going on, how you are feeling, and that you went to the emergency department and had a normal head CT.  Return instructions:   Please return to the Emergency Department if you experience worsening symptoms.  Return if the  medications do not resolve your headache, if it recurs, or if you have multiple episodes of vomiting or cannot keep down fluids.  Return if you have a change from the usual headache.  RETURN IMMEDIATELY IF you:  Develop a sudden, severe headache  Develop confusion or become poorly responsive or faint  Develop a fever above 100.57F or problem breathing  Have a change in speech, vision, swallowing, or understanding  Develop new weakness, numbness, tingling, incoordination in your arms or legs  Have a seizure  Please return if you have any other emergent concerns.  Additional Information:  Your vital signs today were: BP 104/90 (BP Location: Left Arm)   Pulse 89   Temp 98.4 F (36.9 C) (Oral)   Resp 20   SpO2 100%  If your blood pressure (BP) was elevated above 135/85 this visit, please have this repeated by your doctor within one month. --------------

## 2020-05-06 NOTE — ED Provider Notes (Signed)
[facility] EMERGENCY DEPARTMENT Provider Note   CSN: [id number] Arrival date & time: 05/06/20  0044     History Chief Complaint  Patient presents with  . Headache    Rhonda [name] is a 59 y.o. female.  Patient with history of neurosarcoidosis, followed at [facility] --presents the emergency department for evaluation of headache.  She presents with her husband.  Patient had a headache yesterday and then again very early this morning, prompting emergency department visit.  Headaches are atypical for the patient.  She did not have any associated neuro symptoms such as worsening weakness on one side of the body, vision changes, trouble walking.  Patient's husband states that she was able to walk downstairs without any difficulty.  It is unusual for her to have headaches.  She did not take any medication for the pain.  Headache has since resolved and patient is at her baseline feeling good.  Review of documentation and family reports that patient has been having decreased energy while on prednisone and neurologist is currently transitioning her to Remicade.  She continues to take twice daily prednisone without any adjustments.  No associated fevers or chills, worsening confusion, trauma or injury.  No neck pain or sensation problems in the arms or legs.        Past Medical History:  Diagnosis Date  . Arthritis   . Degenerative disc disease, lumbar   . GERD (gastroesophageal reflux disease)   . Hypertension   . OSA (obstructive sleep apnea)   . Prediabetes   . Sarcoidosis of central nervous system   . Seizures ([place])   . Urinary incontinence     Patient Active Problem List   Diagnosis Date Noted  . HLD (hyperlipidemia) 01/12/2020  . Acute lower UTI 01/12/2020  . AMS (altered mental status) 01/11/2020  . Memory changes 01/08/2020  . Altered mental status   . Hyponatremia   . Speech disturbance   . Demyelinating changes in brain ([place]) 11/13/2019  . Acute CVA  (cerebrovascular accident) ([place]) 05/23/2019  . Transaminitis 05/23/2019  . Nausea and vomiting 05/23/2019  . Leukocytosis 05/23/2019  . Thoracic lymphadenopathy 08/26/2018  . Axillary lymphadenopathy 08/26/2018  . Piriformis syndrome of right side 09/29/2016  . OSA (obstructive sleep apnea) 09/27/2015  . Right hip pain 04/11/2015  . Essential hypertension 11/07/2014  . Chronic low back pain 11/07/2014  . Prediabetes 11/07/2014  . Vitamin D deficiency 11/07/2014    Past Surgical History:  Procedure Laterality Date  . ABDOMINAL HYSTERECTOMY    . BREAST EXCISIONAL BIOPSY Right   . BREAST SURGERY     right breast  . CESAREAN SECTION       OB History    Gravida  5   Para      Term      Preterm      AB  1   Living  4     SAB  1   IAB      Ectopic      Multiple  1   Live Births  4           Family History  Problem Relation Age of Onset  . Cancer Mother   . Hypertension Sister   . Diabetes Sister   . Diabetes Maternal Grandmother     Social History   Tobacco Use  . Smoking status: Never Smoker  . Smokeless tobacco: Never Used  Vaping Use  . Vaping Use: Never used  Substance Use Topics  .  Alcohol use: No  . Drug use: No    Home Medications Prior to Admission medications   Medication Sig Start Date End Date Taking? Authorizing Provider  acetaminophen (TYLENOL) 650 MG CR tablet Take 1,300 mg by mouth 2 (two) times daily as needed (arthritis pain).     [provider]  aspirin EC 81 MG tablet Take 1 tablet (81 mg total) by mouth daily. 05/24/19 05/23/20  Dessa Phi, DO  atorvastatin (LIPITOR) 80 MG tablet Take 1 tablet (80 mg total) by mouth daily at 6 PM. Patient taking differently: Take 80 mg by mouth at bedtime.  05/24/19 01/11/20  Dessa Phi, DO  b complex vitamins tablet Take 1 tablet by mouth daily.    [provider]  Calcium Carb-Cholecalciferol (CALCIUM+D3 PO) Take 1 tablet by mouth daily with lunch.    [provider]  glucosamine-chondroitin 500-400 MG tablet Take 1 tablet by mouth 3 (three) times daily. Patient taking differently: Take 2 tablets by mouth daily with lunch.  04/11/15   Funches, Adriana Mccallum, MD  melatonin 3 MG TABS tablet Take 1 tablet (3 mg total) by mouth at bedtime. 01/17/20   Regalado, Belkys A, MD  metoprolol tartrate (LOPRESSOR) 25 MG tablet Take 1 tablet (25 mg total) by mouth 2 (two) times daily. 01/17/20   Regalado, Belkys A, MD  Multiple Vitamin (MULTIVITAMIN WITH MINERALS) TABS tablet Take 1 tablet by mouth daily with lunch.     [provider]  nystatin (MYCOSTATIN) 100000 UNIT/ML suspension Take 5 mLs (500,000 Units total) by mouth 4 (four) times daily. 01/17/20   Regalado, Belkys A, MD  Omega-3 Fatty Acids (FISH OIL) 1000 MG CAPS Take 1,000 mg by mouth daily with lunch.     [provider]  OVER THE COUNTER MEDICATION Take 1 tablet by mouth See admin instructions. Unnamed tablet to treat heartburn: Take 1 tablet by mouth daily as needed for heartburn or reflux    [provider]  oxybutynin (DITROPAN) 5 MG tablet Take 5 mg by mouth 3 (three) times daily. 12/14/19   [provider]  pantoprazole (PROTONIX) 40 MG tablet Take 1 tablet (40 mg total) by mouth daily. 01/17/20   Regalado, Belkys A, MD  polyethylene glycol (MIRALAX / GLYCOLAX) 17 g packet Take 17 g by mouth 2 (two) times daily. 01/17/20   Regalado, Cassie Freer, MD  PRESCRIPTION MEDICATION Inhale into the lungs See admin instructions. CPAP- At bedtime    [provider]  sulfamethoxazole-trimethoprim (BACTRIM DS) 800-160 MG tablet Take 1 tablet by mouth every Monday, Wednesday, and Friday.    [provider]    Allergies    Aspirin and Augmentin [amoxicillin-pot clavulanate]  Review of Systems   Review of Systems  Constitutional: Positive for fatigue (chronic). Negative for fever.  HENT: Negative for congestion, dental problem, rhinorrhea and sinus pressure.    Eyes: Negative for photophobia, discharge, redness and visual disturbance.  Respiratory: Negative for shortness of breath.   Cardiovascular: Negative for chest pain.  Gastrointestinal: Negative for nausea and vomiting.  Musculoskeletal: Negative for gait problem, neck pain and neck stiffness.  Skin: Negative for rash.  Neurological: Positive for headaches. Negative for syncope, speech difficulty, weakness, light-headedness and numbness.  Psychiatric/Behavioral: Negative for confusion.    Physical Exam Updated Vital Signs BP 104/90 (BP Location: Left Arm)   Pulse 89   Temp 98.4 F (36.9 C) (Oral)   Resp 20   SpO2 100%   Physical Exam Vitals and nursing note reviewed.  Constitutional:      Appearance: She is well-developed and well-nourished.  HENT:     Head: Normocephalic and atraumatic.     Right Ear: Tympanic membrane, ear canal and external ear normal.     Left Ear: Tympanic membrane, ear canal and external ear normal.     Nose: Nose normal.     Mouth/Throat:     Mouth: Oropharynx is clear and moist and mucous membranes are normal.     Pharynx: Uvula midline.  Eyes:     General: Lids are normal.     Extraocular Movements: EOM normal.     Right eye: No nystagmus.     Left eye: No nystagmus.     Conjunctiva/sclera: Conjunctivae normal.     Pupils: Pupils are equal, round, and reactive to light.  Cardiovascular:     Rate and Rhythm: Normal rate and regular rhythm.  Pulmonary:     Effort: Pulmonary effort is normal.     Breath sounds: Normal breath sounds.  Abdominal:     Palpations: Abdomen is soft.     Tenderness: There is no abdominal tenderness.  Musculoskeletal:     Cervical back: Normal range of motion and neck supple. No tenderness or bony tenderness. Normal range of motion.  Skin:    General: Skin is warm and dry.  Neurological:     Mental Status: She is alert and oriented to person, place, and time.     GCS: GCS eye subscore is 4. GCS verbal subscore is 5.  GCS motor subscore is 6.     Cranial Nerves: No cranial nerve deficit.     Sensory: No sensory deficit.     Coordination: She displays a negative Romberg sign. Coordination normal.     Deep Tendon Reflexes: Strength normal and reflexes are normal and symmetric.     Comments: Mild confusion at times, husband able to confirm she is at baseline.   Psychiatric:        Mood and Affect: Mood and affect normal.     ED Results / Procedures / Treatments   Labs (all labs ordered are listed, but only abnormal results are displayed) Labs Reviewed  CBC - Abnormal; Notable for the following components:      Result Value   WBC 12.2 (*)    RBC 3.84 (*)    Hemoglobin 11.8 (*)    HCT 35.9 (*)    RDW 16.7 (*)    All other components within normal limits  BASIC METABOLIC PANEL - Abnormal; Notable for the following components:   Sodium 126 (*)    Chloride 89 (*)    Glucose, Bld 198 (*)    Calcium 8.7 (*)    All other components within normal limits    EKG None  Radiology CT Head Wo Contrast  Result Date: 05/06/2020 CLINICAL DATA:  Severe headache. EXAM: CT HEAD WITHOUT CONTRAST TECHNIQUE: Contiguous axial images were obtained from the base of the skull through the vertex without intravenous contrast. COMPARISON:  January 16, 2020 FINDINGS: Brain: No evidence of acute infarction, hemorrhage, hydrocephalus, extra-axial collection or mass lesion/mass effect. Vascular: No hyperdense vessel or unexpected calcification. Skull: Normal. Negative for fracture or focal lesion. Sinuses/Orbits: No acute finding. Other: None. IMPRESSION: No acute intracranial pathology. Electronically Signed   By: Virgina Norfolk M.D.   On: 05/06/2020 01:56    Procedures Procedures (including critical care time)  Medications Ordered in ED Medications - No data to display  ED Course  I have reviewed  the triage vital signs and the nursing notes.  Pertinent labs & imaging results that were available during my care of  the patient were reviewed by me and considered in my medical decision making (see chart for details).  Patient seen and examined.  CT and labs reviewed.  Patient affirms that her headache is resolved and she is at her baseline.  No concerning history of strokelike features, corroborated by patient's husband.  Vital signs reviewed and are as follows: BP 104/90 (BP Location: Left Arm)   Pulse 89   Temp 98.4 F (36.9 C) (Oral)   Resp 20   SpO2 100%   Patient is currently feeling better without headache, has a normal neurologic exam.  Labs and CT are reassuring.  Patient is comfortable discharged home.  I have encouraged him to speak with their neurologist tomorrow regarding her recent symptoms.  We discussed signs and symptoms to return. Patient counseled to return if they have new weakness in their arms or legs, slurred speech, trouble walking or talking, worsening confusion, new trouble with their balance, or if they have any other concerns. Patient verbalizes understanding and agrees with plan.      MDM Rules/Calculators/A&P                          Patient with HA in setting of neurosarcoidosis. Patient without high-risk features of headache including: sudden onset/thunderclap HA, altered mental status, accompanying seizure, headache with exertion, neck or shoulder pain, fever, use of anticoagulation, family history of spontaneous SAH, concomitant drug use, toxic exposure.   Patient has a normal complete neurological exam, normal vital signs, normal level of consciousness, no signs of meningismus, is well-appearing/non-toxic appearing, no signs of trauma.   Imaging with CT was negative. Do not feel MRI indicated as patient currently asymptomatic without objective neuro findings.  No dangerous or life-threatening conditions suspected or identified by history, physical exam, and by work-up. No indications for hospitalization identified.     Final Clinical Impression(s) / ED  Diagnoses Final diagnoses:  Acute nonintractable headache, unspecified headache type    Rx / DC Orders ED Discharge Orders    None       Carlisle Cater, PA-C 05/06/20 0935    Davonna Belling, MD 05/06/20 1539

## 2020-05-06 NOTE — ED Triage Notes (Signed)
Pt presents to ED POV. Pt c/o severe HA. Per family memeber pt has neuro sarcoidosis and was told to bring pt to hosp w/ any neuro complaints. Pt AAO x4

## 2020-06-17 ENCOUNTER — Emergency Department (HOSPITAL_COMMUNITY)
Admission: EM | Admit: 2020-06-17 | Discharge: 2020-06-18 | Disposition: A | Discharge status: Home / Self Care | Payer: 59 | Attending: Emergency Medicine | Admitting: Emergency Medicine

## 2020-06-17 ENCOUNTER — Other Ambulatory Visit: Payer: Self-pay

## 2020-06-17 DIAGNOSIS — R4182 Altered mental status, unspecified: Secondary | ICD-10-CM | POA: Insufficient documentation

## 2020-06-17 DIAGNOSIS — I10 Essential (primary) hypertension: Secondary | ICD-10-CM | POA: Insufficient documentation

## 2020-06-17 LAB — CBG MONITORING, ED: Glucose-Capillary: 303 mg/dL — ABNORMAL HIGH (ref 70–99)

## 2020-06-17 NOTE — ED Triage Notes (Signed)
EMS arrival from home per EMS family reports increasing confusion from baseline GCS 14. Per EMS pt able to follow commands, but unable to hold conservation. CBG  400, denies hx DM, takes prednisone. Per EMS hx neuro psychosis, CVA, HTN

## 2020-06-18 ENCOUNTER — Emergency Department (HOSPITAL_COMMUNITY): Payer: 59

## 2020-06-18 LAB — COMPREHENSIVE METABOLIC PANEL
ALT: 65 U/L — ABNORMAL HIGH (ref 0–44)
AST: 38 U/L (ref 15–41)
Albumin: 3.3 g/dL — ABNORMAL LOW (ref 3.5–5.0)
Alkaline Phosphatase: 118 U/L (ref 38–126)
Anion gap: 15 (ref 5–15)
BUN: 6 mg/dL (ref 6–20)
CO2: 28 mmol/L (ref 22–32)
Calcium: 9.2 mg/dL (ref 8.9–10.3)
Chloride: 86 mmol/L — ABNORMAL LOW (ref 98–111)
Creatinine, Ser: 0.67 mg/dL (ref 0.44–1.00)
GFR, Estimated: >60 mL/min (ref >60)
Glucose, Bld: 309 mg/dL — ABNORMAL HIGH (ref 70–99)
Potassium: 4.5 mmol/L (ref 3.5–5.1)
Sodium: 129 mmol/L — ABNORMAL LOW (ref 135–145)
Total Bilirubin: 0.8 mg/dL (ref 0.3–1.2)
Total Protein: 5.7 g/dL — ABNORMAL LOW (ref 6.5–8.1)

## 2020-06-18 LAB — CBC
HCT: 42.4 % (ref 36.0–46.0)
Hemoglobin: 13.9 g/dL (ref 12.0–15.0)
MCH: 30.3 pg (ref 26.0–34.0)
MCHC: 32.8 g/dL (ref 30.0–36.0)
MCV: 92.6 fL (ref 80.0–100.0)
Platelets: 202 10*3/uL (ref 150–400)
RBC: 4.58 MIL/uL (ref 3.87–5.11)
RDW: 16.4 % — ABNORMAL HIGH (ref 11.5–15.5)
WBC: 13.8 10*3/uL — ABNORMAL HIGH (ref 4.0–10.5)
nRBC: 0.3 % — ABNORMAL HIGH (ref 0.0–0.2)

## 2020-06-18 LAB — URINALYSIS, ROUTINE W REFLEX MICROSCOPIC
Bilirubin Urine: NEGATIVE
Glucose, UA: >=500 mg/dL — AB
Ketones, ur: NEGATIVE mg/dL
Nitrite: NEGATIVE
Protein, ur: NEGATIVE mg/dL
Specific Gravity, Urine: 1.006 (ref 1.005–1.030)
pH: 8 (ref 5.0–8.0)

## 2020-06-18 LAB — URINALYSIS, MICROSCOPIC (REFLEX)

## 2020-06-18 LAB — BRAIN NATRIURETIC PEPTIDE: B Natriuretic Peptide: 55.3 pg/mL (ref 0.0–100.0)

## 2020-06-18 LAB — TROPONIN I (HIGH SENSITIVITY): Troponin I (High Sensitivity): 15 ng/L (ref <18)

## 2020-06-18 MED ORDER — GADOBUTROL 1 MMOL/ML IV SOLN
7.5000 mL | Freq: Once | INTRAVENOUS | Status: AC | PRN
  Administered 2020-06-18: 7.5 mL via INTRAVENOUS

## 2020-06-18 MED ORDER — SODIUM CHLORIDE 0.9 % IV BOLUS
500.0000 mL | Freq: Once | INTRAVENOUS | Status: AC
  Administered 2020-06-18: 500 mL via INTRAVENOUS

## 2020-06-18 MED ORDER — IOHEXOL 350 MG/ML SOLN
100.0000 mL | Freq: Once | INTRAVENOUS | Status: AC | PRN
  Administered 2020-06-18: 100 mL via INTRAVENOUS

## 2020-06-18 MED ORDER — ONDANSETRON HCL 4 MG/2ML IJ SOLN
4.0000 mg | Freq: Once | INTRAMUSCULAR | Status: AC
  Administered 2020-06-18: 4 mg via INTRAVENOUS
  Filled 2020-06-18: qty 2

## 2020-06-18 NOTE — ED Provider Notes (Signed)
[facility] Emergency Department Provider Note MRN:  [id number]  Arrival date & time: 06/18/20     Chief Complaint   Altered Mental Status   History of Present Illness   Rhonda [name] is a 60 y.o. year-old female with a history of neurosarcoidosis presenting to the ED with chief complaint of altered mental status.  Family reporting altered mental status starting at 7 PM this evening.  Described as more of a disinterest or lack of participation in home care, did not want to get out of her chair to use the bathroom which is abnormal for her.  Here in the emergency department she is not speaking normally, answering yes or no questions but not forming words.  She denies numbness or weakness, no pain.  Also some nausea and vomiting over the past 2 days per family.  Has been taking prednisone 40 mg twice daily at home.  Review of Systems  A complete 10 system review of systems was obtained and all systems are negative except as noted in the HPI and PMH.   Patient's Health History    Past Medical History:  Diagnosis Date  . Arthritis   . Degenerative disc disease, lumbar   . GERD (gastroesophageal reflux disease)   . Hypertension   . OSA (obstructive sleep apnea)   . Prediabetes   . Sarcoidosis of central nervous system   . Seizures ([place])   . Urinary incontinence     Past Surgical History:  Procedure Laterality Date  . ABDOMINAL HYSTERECTOMY    . BREAST EXCISIONAL BIOPSY Right   . BREAST SURGERY     right breast  . CESAREAN SECTION      Family History  Problem Relation Age of Onset  . Cancer Mother   . Hypertension Sister   . Diabetes Sister   . Diabetes Maternal Grandmother     Social History   Socioeconomic History  . Marital status: Married    Spouse name: Not on file  . Number of children: Not on file  . Years of education: Not on file  . Highest education level: Not on file  Occupational History  . Not on file  Tobacco Use  . Smoking  status: Never Smoker  . Smokeless tobacco: Never Used  Vaping Use  . Vaping Use: Never used  Substance and Sexual Activity  . Alcohol use: No  . Drug use: No  . Sexual activity: Yes    Birth control/protection: None  Other Topics Concern  . Not on file  Social History Narrative  . Not on file   Social Determinants of Health   Financial Resource Strain: Not on file  Food Insecurity: Not on file  Transportation Needs: Not on file  Physical Activity: Not on file  Stress: Not on file  Social Connections: Not on file  Intimate Partner Violence: Not on file     Physical Exam   Vitals:   06/17/20 2347  BP: (!) 149/109  Pulse: 96  Resp: 20  Temp: 98 F (36.7 C)  SpO2: 97%    CONSTITUTIONAL: Well-appearing, NAD NEURO: Awake, alert, possible expressive aphasia but able to answer yes or no questions, symmetric strength and sensation to the arms and legs, no facial droop EYES:  eyes equal and reactive ENT/NECK:  no LAD, no JVD CARDIO: Regular rate, well-perfused, normal S1 and S2 PULM:  CTAB no wheezing or rhonchi GI/GU:  normal bowel sounds, non-distended, non-tender MSK/SPINE:  No gross deformities, 2+ pitting edema BL  LE SKIN:  no rash, atraumatic PSYCH:  Appropriate speech and behavior  *Additional and/or pertinent findings included in MDM below  Diagnostic and Interventional Summary    EKG Interpretation  Date/Time:  Sunday June 17 2020 23:52:10 EST Ventricular Rate:  91 PR Interval:    QRS Duration: 83 QT Interval:  368 QTC Calculation: 453 R Axis:   2 Text Interpretation: Sinus rhythm Borderline short PR interval Right atrial enlargement Nonspecific ST depression No significant change was found Confirmed by Gerlene Fee [phone]) on 06/18/2020 12:11:56 AM      Labs Reviewed  CBG MONITORING, ED - Abnormal; Notable for the following components:      Result Value   Glucose-Capillary 303 (*)    All other components within normal limits  CBC  COMPREHENSIVE  METABOLIC PANEL  URINALYSIS, ROUTINE W REFLEX MICROSCOPIC    CT Angio Head W or Wo Contrast    (Results Pending)  CT Angio Neck W and/or Wo Contrast    (Results Pending)    Medications  sodium chloride 0.9 % bolus 500 mL (500 mLs Intravenous New Bag/Given 06/18/20 0019)  ondansetron (ZOFRAN) injection 4 mg (4 mg Intravenous Given 06/18/20 0020)     Procedures  /  Critical Care Procedures  ED Course and Medical Decision Making  I have reviewed the triage vital signs, the nursing notes, and pertinent available records from the EMR.  Listed above are laboratory and imaging tests that I personally ordered, reviewed, and interpreted and then considered in my medical decision making (see below for details).  Suspect flare of neurosarcoidosis, also considering acute ischemic stroke.  Given the possibility of aphasia in the relatively acute onset I discussed the case with Dr. Cheral Marker of neurology.  Dr. Cheral Marker plans to assess the patient now and we will obtain CT imaging of the head neck to evaluate the vasculature for any signs of large vessel occlusion stroke.  Overall the expectation is patient will need to be admitted.     CTA is without abnormalities, MRI brain is also unchanged.  On reassessment patient seems much improved and husband is bedside explained to patient is at her baseline.  She seems to do this often where she begins to act a bit confused and then is much better after taking a nap.  Doubt emergent process, appropriate for discharge.  Barth Kirks. Sedonia Small, [place] [email or web address]  Final Clinical Impressions(s) / ED Diagnoses     ICD-10-CM   1. Altered mental status, unspecified altered mental status type  R41.82     ED Discharge Orders    None       Discharge Instructions Discussed with and Provided to Patient:   Discharge Instructions   None       Maudie Flakes, MD 06/18/20 [phone]

## 2020-06-18 NOTE — ED Notes (Signed)
Patient transported to CT 

## 2020-06-18 NOTE — ED Notes (Signed)
Pt escorted to MRI with family at bedside per MRI request.

## 2020-06-18 NOTE — Discharge Instructions (Addendum)
You were evaluated in the Emergency Department and after careful evaluation, we did not find any emergent condition requiring admission or further testing in the hospital.  Your exam/testing today is overall reassuring.  Recommend continued follow-up with your regular doctors.  Please return to the Emergency Department if you experience any worsening of your condition.   Thank you for allowing us to be a part of your care. 

## 2020-06-20 LAB — URINE CULTURE: Culture: >=100000 — AB

## 2020-07-22 ENCOUNTER — Inpatient Hospital Stay (HOSPITAL_COMMUNITY)
Admission: EM | Admit: 2020-07-22 | Discharge: 2020-07-27 | DRG: 377 | Disposition: A | Discharge status: Home / Self Care | Payer: 59 | Attending: Internal Medicine | Admitting: Internal Medicine

## 2020-07-22 ENCOUNTER — Encounter (HOSPITAL_COMMUNITY): Payer: Self-pay | Admitting: Emergency Medicine

## 2020-07-22 ENCOUNTER — Other Ambulatory Visit: Payer: Self-pay

## 2020-07-22 DIAGNOSIS — D62 Acute posthemorrhagic anemia: Secondary | ICD-10-CM | POA: Diagnosis present

## 2020-07-22 DIAGNOSIS — D179 Benign lipomatous neoplasm, unspecified: Secondary | ICD-10-CM | POA: Diagnosis present

## 2020-07-22 DIAGNOSIS — N390 Urinary tract infection, site not specified: Secondary | ICD-10-CM | POA: Diagnosis present

## 2020-07-22 DIAGNOSIS — R578 Other shock: Secondary | ICD-10-CM | POA: Diagnosis not present

## 2020-07-22 DIAGNOSIS — K922 Gastrointestinal hemorrhage, unspecified: Secondary | ICD-10-CM | POA: Diagnosis present

## 2020-07-22 DIAGNOSIS — Z7952 Long term (current) use of systemic steroids: Secondary | ICD-10-CM

## 2020-07-22 DIAGNOSIS — Z8719 Personal history of other diseases of the digestive system: Secondary | ICD-10-CM

## 2020-07-22 DIAGNOSIS — D8689 Sarcoidosis of other sites: Secondary | ICD-10-CM | POA: Diagnosis present

## 2020-07-22 DIAGNOSIS — K921 Melena: Secondary | ICD-10-CM | POA: Diagnosis present

## 2020-07-22 DIAGNOSIS — K5733 Diverticulitis of large intestine without perforation or abscess with bleeding: Principal | ICD-10-CM | POA: Diagnosis present

## 2020-07-22 DIAGNOSIS — Z833 Family history of diabetes mellitus: Secondary | ICD-10-CM

## 2020-07-22 DIAGNOSIS — Z90710 Acquired absence of both cervix and uterus: Secondary | ICD-10-CM

## 2020-07-22 DIAGNOSIS — E1165 Type 2 diabetes mellitus with hyperglycemia: Secondary | ICD-10-CM | POA: Diagnosis present

## 2020-07-22 DIAGNOSIS — Z79899 Other long term (current) drug therapy: Secondary | ICD-10-CM

## 2020-07-22 DIAGNOSIS — Z888 Allergy status to other drugs, medicaments and biological substances status: Secondary | ICD-10-CM

## 2020-07-22 DIAGNOSIS — G4733 Obstructive sleep apnea (adult) (pediatric): Secondary | ICD-10-CM | POA: Diagnosis present

## 2020-07-22 DIAGNOSIS — M5136 Other intervertebral disc degeneration, lumbar region: Secondary | ICD-10-CM | POA: Diagnosis present

## 2020-07-22 DIAGNOSIS — E1100 Type 2 diabetes mellitus with hyperosmolarity without nonketotic hyperglycemic-hyperosmolar coma (NKHHC): Secondary | ICD-10-CM | POA: Diagnosis present

## 2020-07-22 DIAGNOSIS — Z809 Family history of malignant neoplasm, unspecified: Secondary | ICD-10-CM

## 2020-07-22 DIAGNOSIS — E669 Obesity, unspecified: Secondary | ICD-10-CM | POA: Diagnosis present

## 2020-07-22 DIAGNOSIS — I10 Essential (primary) hypertension: Secondary | ICD-10-CM | POA: Diagnosis present

## 2020-07-22 DIAGNOSIS — D492 Neoplasm of unspecified behavior of bone, soft tissue, and skin: Secondary | ICD-10-CM | POA: Diagnosis present

## 2020-07-22 DIAGNOSIS — R7303 Prediabetes: Secondary | ICD-10-CM

## 2020-07-22 DIAGNOSIS — K219 Gastro-esophageal reflux disease without esophagitis: Secondary | ICD-10-CM | POA: Diagnosis present

## 2020-07-22 DIAGNOSIS — K769 Liver disease, unspecified: Secondary | ICD-10-CM | POA: Diagnosis present

## 2020-07-22 DIAGNOSIS — Z6830 Body mass index (BMI) 30.0-30.9, adult: Secondary | ICD-10-CM

## 2020-07-22 DIAGNOSIS — Z8673 Personal history of transient ischemic attack (TIA), and cerebral infarction without residual deficits: Secondary | ICD-10-CM

## 2020-07-22 DIAGNOSIS — Z8249 Family history of ischemic heart disease and other diseases of the circulatory system: Secondary | ICD-10-CM

## 2020-07-22 DIAGNOSIS — K5909 Other constipation: Secondary | ICD-10-CM | POA: Diagnosis present

## 2020-07-22 DIAGNOSIS — K760 Fatty (change of) liver, not elsewhere classified: Secondary | ICD-10-CM | POA: Diagnosis present

## 2020-07-22 DIAGNOSIS — Z20822 Contact with and (suspected) exposure to covid-19: Secondary | ICD-10-CM | POA: Diagnosis present

## 2020-07-22 DIAGNOSIS — E876 Hypokalemia: Secondary | ICD-10-CM | POA: Diagnosis not present

## 2020-07-22 DIAGNOSIS — Z886 Allergy status to analgesic agent status: Secondary | ICD-10-CM

## 2020-07-22 DIAGNOSIS — D509 Iron deficiency anemia, unspecified: Secondary | ICD-10-CM | POA: Diagnosis present

## 2020-07-22 DIAGNOSIS — R319 Hematuria, unspecified: Secondary | ICD-10-CM | POA: Diagnosis present

## 2020-07-22 HISTORY — DX: Type 2 diabetes mellitus without complications: E11.9

## 2020-07-22 NOTE — ED Triage Notes (Signed)
Patient reports bloody stools onset yesterday with low abdominal pain , no emesis or fever .

## 2020-07-23 ENCOUNTER — Emergency Department (HOSPITAL_COMMUNITY): Payer: 59

## 2020-07-23 ENCOUNTER — Encounter (HOSPITAL_COMMUNITY): Payer: Self-pay | Admitting: Family Medicine

## 2020-07-23 DIAGNOSIS — K769 Liver disease, unspecified: Secondary | ICD-10-CM | POA: Diagnosis present

## 2020-07-23 DIAGNOSIS — K922 Gastrointestinal hemorrhage, unspecified: Secondary | ICD-10-CM | POA: Diagnosis not present

## 2020-07-23 DIAGNOSIS — D492 Neoplasm of unspecified behavior of bone, soft tissue, and skin: Secondary | ICD-10-CM | POA: Diagnosis present

## 2020-07-23 DIAGNOSIS — Z809 Family history of malignant neoplasm, unspecified: Secondary | ICD-10-CM | POA: Diagnosis not present

## 2020-07-23 DIAGNOSIS — K219 Gastro-esophageal reflux disease without esophagitis: Secondary | ICD-10-CM | POA: Diagnosis present

## 2020-07-23 DIAGNOSIS — Z8719 Personal history of other diseases of the digestive system: Secondary | ICD-10-CM | POA: Diagnosis not present

## 2020-07-23 DIAGNOSIS — Z79899 Other long term (current) drug therapy: Secondary | ICD-10-CM | POA: Diagnosis not present

## 2020-07-23 DIAGNOSIS — D62 Acute posthemorrhagic anemia: Secondary | ICD-10-CM | POA: Diagnosis present

## 2020-07-23 DIAGNOSIS — E1165 Type 2 diabetes mellitus with hyperglycemia: Secondary | ICD-10-CM | POA: Diagnosis not present

## 2020-07-23 DIAGNOSIS — K5733 Diverticulitis of large intestine without perforation or abscess with bleeding: Secondary | ICD-10-CM | POA: Diagnosis present

## 2020-07-23 DIAGNOSIS — I10 Essential (primary) hypertension: Secondary | ICD-10-CM | POA: Diagnosis present

## 2020-07-23 DIAGNOSIS — Z7952 Long term (current) use of systemic steroids: Secondary | ICD-10-CM | POA: Diagnosis not present

## 2020-07-23 DIAGNOSIS — Z886 Allergy status to analgesic agent status: Secondary | ICD-10-CM | POA: Diagnosis not present

## 2020-07-23 DIAGNOSIS — K921 Melena: Secondary | ICD-10-CM | POA: Diagnosis present

## 2020-07-23 DIAGNOSIS — Z90710 Acquired absence of both cervix and uterus: Secondary | ICD-10-CM | POA: Diagnosis not present

## 2020-07-23 DIAGNOSIS — G4733 Obstructive sleep apnea (adult) (pediatric): Secondary | ICD-10-CM | POA: Diagnosis present

## 2020-07-23 DIAGNOSIS — Z888 Allergy status to other drugs, medicaments and biological substances status: Secondary | ICD-10-CM | POA: Diagnosis not present

## 2020-07-23 DIAGNOSIS — Z833 Family history of diabetes mellitus: Secondary | ICD-10-CM | POA: Diagnosis not present

## 2020-07-23 DIAGNOSIS — Z6830 Body mass index (BMI) 30.0-30.9, adult: Secondary | ICD-10-CM | POA: Diagnosis not present

## 2020-07-23 DIAGNOSIS — R578 Other shock: Secondary | ICD-10-CM | POA: Diagnosis present

## 2020-07-23 DIAGNOSIS — N390 Urinary tract infection, site not specified: Secondary | ICD-10-CM | POA: Diagnosis present

## 2020-07-23 DIAGNOSIS — D179 Benign lipomatous neoplasm, unspecified: Secondary | ICD-10-CM | POA: Diagnosis present

## 2020-07-23 DIAGNOSIS — Z8249 Family history of ischemic heart disease and other diseases of the circulatory system: Secondary | ICD-10-CM | POA: Diagnosis not present

## 2020-07-23 DIAGNOSIS — Z8673 Personal history of transient ischemic attack (TIA), and cerebral infarction without residual deficits: Secondary | ICD-10-CM | POA: Diagnosis not present

## 2020-07-23 DIAGNOSIS — K760 Fatty (change of) liver, not elsewhere classified: Secondary | ICD-10-CM | POA: Diagnosis present

## 2020-07-23 DIAGNOSIS — Z20822 Contact with and (suspected) exposure to covid-19: Secondary | ICD-10-CM | POA: Diagnosis present

## 2020-07-23 DIAGNOSIS — D8689 Sarcoidosis of other sites: Secondary | ICD-10-CM | POA: Diagnosis present

## 2020-07-23 DIAGNOSIS — E1100 Type 2 diabetes mellitus with hyperosmolarity without nonketotic hyperglycemic-hyperosmolar coma (NKHHC): Secondary | ICD-10-CM | POA: Diagnosis present

## 2020-07-23 LAB — URINALYSIS, ROUTINE W REFLEX MICROSCOPIC
Bilirubin Urine: NEGATIVE
Glucose, UA: >=500 mg/dL — AB
Ketones, ur: 20 mg/dL — AB
Nitrite: NEGATIVE
Protein, ur: NEGATIVE mg/dL
Specific Gravity, Urine: 1.025 (ref 1.005–1.030)
pH: 5 (ref 5.0–8.0)

## 2020-07-23 LAB — CBC WITH DIFFERENTIAL/PLATELET
Abs Immature Granulocytes: 0 10*3/uL (ref 0.00–0.07)
Basophils Absolute: 0 10*3/uL (ref 0.0–0.1)
Basophils Relative: 0 %
Eosinophils Absolute: 0 10*3/uL (ref 0.0–0.5)
Eosinophils Relative: 0 %
HCT: 25.4 % — ABNORMAL LOW (ref 36.0–46.0)
Hemoglobin: 8.6 g/dL — ABNORMAL LOW (ref 12.0–15.0)
Lymphocytes Relative: 4 %
Lymphs Abs: 0.4 10*3/uL — ABNORMAL LOW (ref 0.7–4.0)
MCH: 30.9 pg (ref 26.0–34.0)
MCHC: 33.9 g/dL (ref 30.0–36.0)
MCV: 91.4 fL (ref 80.0–100.0)
Monocytes Absolute: 0.1 10*3/uL (ref 0.1–1.0)
Monocytes Relative: 1 %
Neutro Abs: 9.4 10*3/uL — ABNORMAL HIGH (ref 1.7–7.7)
Neutrophils Relative %: 95 %
Platelets: 231 10*3/uL (ref 150–400)
RBC: 2.78 MIL/uL — ABNORMAL LOW (ref 3.87–5.11)
RDW: 16.3 % — ABNORMAL HIGH (ref 11.5–15.5)
WBC: 9.9 10*3/uL (ref 4.0–10.5)
nRBC: 2.1 % — ABNORMAL HIGH (ref 0.0–0.2)
nRBC: 3 /100 WBC — ABNORMAL HIGH

## 2020-07-23 LAB — BASIC METABOLIC PANEL
Anion gap: 13 (ref 5–15)
Anion gap: 14 (ref 5–15)
BUN: 19 mg/dL (ref 6–20)
BUN: 18 mg/dL (ref 6–20)
CO2: 20 mmol/L — ABNORMAL LOW (ref 22–32)
CO2: 21 mmol/L — ABNORMAL LOW (ref 22–32)
Calcium: 8.4 mg/dL — ABNORMAL LOW (ref 8.9–10.3)
Calcium: 8.2 mg/dL — ABNORMAL LOW (ref 8.9–10.3)
Chloride: 92 mmol/L — ABNORMAL LOW (ref 98–111)
Chloride: 98 mmol/L (ref 98–111)
Creatinine, Ser: 0.89 mg/dL (ref 0.44–1.00)
Creatinine, Ser: 0.72 mg/dL (ref 0.44–1.00)
GFR, Estimated: >60 mL/min (ref >60)
GFR, Estimated: >60 mL/min (ref >60)
Glucose, Bld: 492 mg/dL — ABNORMAL HIGH (ref 70–99)
Glucose, Bld: 253 mg/dL — ABNORMAL HIGH (ref 70–99)
Potassium: 4.7 mmol/L (ref 3.5–5.1)
Potassium: 4.7 mmol/L (ref 3.5–5.1)
Sodium: 125 mmol/L — ABNORMAL LOW (ref 135–145)
Sodium: 133 mmol/L — ABNORMAL LOW (ref 135–145)

## 2020-07-23 LAB — COMPREHENSIVE METABOLIC PANEL
ALT: 50 U/L — ABNORMAL HIGH (ref 0–44)
AST: 35 U/L (ref 15–41)
Albumin: 2.5 g/dL — ABNORMAL LOW (ref 3.5–5.0)
Alkaline Phosphatase: 122 U/L (ref 38–126)
Anion gap: 13 (ref 5–15)
BUN: 19 mg/dL (ref 6–20)
CO2: 21 mmol/L — ABNORMAL LOW (ref 22–32)
Calcium: 8.7 mg/dL — ABNORMAL LOW (ref 8.9–10.3)
Chloride: 90 mmol/L — ABNORMAL LOW (ref 98–111)
Creatinine, Ser: 0.99 mg/dL (ref 0.44–1.00)
GFR, Estimated: >60 mL/min (ref >60)
Glucose, Bld: 563 mg/dL (ref 70–99)
Potassium: 4.9 mmol/L (ref 3.5–5.1)
Sodium: 124 mmol/L — ABNORMAL LOW (ref 135–145)
Total Bilirubin: 0.9 mg/dL (ref 0.3–1.2)
Total Protein: 5.1 g/dL — ABNORMAL LOW (ref 6.5–8.1)

## 2020-07-23 LAB — HEMOGLOBIN
Hemoglobin: 9.2 g/dL — ABNORMAL LOW (ref 12.0–15.0)
Hemoglobin: 7.5 g/dL — ABNORMAL LOW (ref 12.0–15.0)
Hemoglobin: 6.4 g/dL — CL (ref 12.0–15.0)

## 2020-07-23 LAB — I-STAT BETA HCG BLOOD, ED (MC, WL, AP ONLY): I-stat hCG, quantitative: <5 m[IU]/mL (ref <5)

## 2020-07-23 LAB — PROTIME-INR
INR: 1 (ref 0.8–1.2)
Prothrombin Time: 12.9 seconds (ref 11.4–15.2)

## 2020-07-23 LAB — CBG MONITORING, ED
Glucose-Capillary: 416 mg/dL — ABNORMAL HIGH (ref 70–99)
Glucose-Capillary: 389 mg/dL — ABNORMAL HIGH (ref 70–99)
Glucose-Capillary: 340 mg/dL — ABNORMAL HIGH (ref 70–99)
Glucose-Capillary: 139 mg/dL — ABNORMAL HIGH (ref 70–99)

## 2020-07-23 LAB — PREPARE RBC (CROSSMATCH)

## 2020-07-23 LAB — SARS CORONAVIRUS 2 (TAT 6-24 HRS): SARS Coronavirus 2: NEGATIVE

## 2020-07-23 LAB — GLUCOSE, CAPILLARY
Glucose-Capillary: 407 mg/dL — ABNORMAL HIGH (ref 70–99)
Glucose-Capillary: 374 mg/dL — ABNORMAL HIGH (ref 70–99)

## 2020-07-23 LAB — HEMATOCRIT
HCT: 27.6 % — ABNORMAL LOW (ref 36.0–46.0)
HCT: 22.2 % — ABNORMAL LOW (ref 36.0–46.0)
HCT: 20.7 % — ABNORMAL LOW (ref 36.0–46.0)

## 2020-07-23 LAB — HEMOGLOBIN AND HEMATOCRIT, BLOOD
HCT: 19.5 % — ABNORMAL LOW (ref 36.0–46.0)
Hemoglobin: 6.2 g/dL — CL (ref 12.0–15.0)

## 2020-07-23 LAB — LIPASE, BLOOD: Lipase: 64 U/L — ABNORMAL HIGH (ref 11–51)

## 2020-07-23 LAB — HEMOGLOBIN A1C
Hgb A1c MFr Bld: 11.8 % — ABNORMAL HIGH (ref 4.8–5.6)
Mean Plasma Glucose: 291.96 mg/dL

## 2020-07-23 LAB — ABO/RH: ABO/RH(D): B POS

## 2020-07-23 LAB — BETA-HYDROXYBUTYRIC ACID: Beta-Hydroxybutyric Acid: 0.37 mmol/L — ABNORMAL HIGH (ref 0.05–0.27)

## 2020-07-23 MED ORDER — INSULIN GLARGINE 100 UNIT/ML ~~LOC~~ SOLN
12.0000 [IU] | Freq: Every day | SUBCUTANEOUS | Status: DC
  Administered 2020-07-23 – 2020-07-24 (×2): 12 [IU] via SUBCUTANEOUS
  Filled 2020-07-23 (×4): qty 0.12

## 2020-07-23 MED ORDER — SODIUM CHLORIDE 0.9% IV SOLUTION: INTRAVENOUS | Status: DC | PRN

## 2020-07-23 MED ORDER — METHYLPREDNISOLONE SODIUM SUCC 40 MG IJ SOLR
40.0000 mg | Freq: Two times a day (BID) | INTRAMUSCULAR | Status: DC
  Administered 2020-07-23 – 2020-07-27 (×8): 40 mg via INTRAVENOUS
  Filled 2020-07-23 (×9): qty 1

## 2020-07-23 MED ORDER — FENTANYL CITRATE (PF) 100 MCG/2ML IJ SOLN
25.0000 ug | INTRAMUSCULAR | Status: DC | PRN
  Administered 2020-07-25 – 2020-07-27 (×5): 50 ug via INTRAVENOUS
  Filled 2020-07-23 (×5): qty 2

## 2020-07-23 MED ORDER — SODIUM CHLORIDE 0.9 % IV SOLN: INTRAVENOUS | Status: DC

## 2020-07-23 MED ORDER — METRONIDAZOLE IN NACL 5-0.79 MG/ML-% IV SOLN: 500.0000 mg | Freq: Once | INTRAVENOUS | Status: DC

## 2020-07-23 MED ORDER — POLYVINYL ALCOHOL 1.4 % OP SOLN
2.0000 [drp] | OPHTHALMIC | Status: DC | PRN
  Administered 2020-07-23 – 2020-07-26 (×4): 2 [drp] via OPHTHALMIC
  Filled 2020-07-23: qty 15

## 2020-07-23 MED ORDER — LIVING WELL WITH DIABETES BOOK
Freq: Once | Status: AC
  Filled 2020-07-23: qty 1

## 2020-07-23 MED ORDER — LACTATED RINGERS IV SOLN: INTRAVENOUS | Status: DC

## 2020-07-23 MED ORDER — PIPERACILLIN-TAZOBACTAM 3.375 G IVPB 30 MIN
3.3750 g | Freq: Once | INTRAVENOUS | Status: AC
  Administered 2020-07-23: 3.375 g via INTRAVENOUS
  Filled 2020-07-23: qty 50

## 2020-07-23 MED ORDER — ONDANSETRON HCL 4 MG/2ML IJ SOLN: 4.0000 mg | Freq: Four times a day (QID) | INTRAMUSCULAR | Status: DC | PRN

## 2020-07-23 MED ORDER — CIPROFLOXACIN IN D5W 400 MG/200ML IV SOLN: 400.0000 mg | Freq: Once | INTRAVENOUS | Status: DC

## 2020-07-23 MED ORDER — SODIUM CHLORIDE 0.9% IV SOLUTION: Freq: Once | INTRAVENOUS | Status: AC

## 2020-07-23 MED ORDER — SODIUM CHLORIDE 0.9 % IV BOLUS
500.0000 mL | Freq: Once | INTRAVENOUS | Status: AC
  Administered 2020-07-23: 500 mL via INTRAVENOUS

## 2020-07-23 MED ORDER — ONDANSETRON HCL 4 MG PO TABS: 4.0000 mg | ORAL_TABLET | Freq: Four times a day (QID) | ORAL | Status: DC | PRN

## 2020-07-23 MED ORDER — LACTATED RINGERS IV BOLUS
1000.0000 mL | Freq: Once | INTRAVENOUS | Status: AC
  Administered 2020-07-23: 1000 mL via INTRAVENOUS

## 2020-07-23 MED ORDER — PANTOPRAZOLE SODIUM 40 MG IV SOLR
40.0000 mg | INTRAVENOUS | Status: DC
  Administered 2020-07-23 – 2020-07-27 (×5): 40 mg via INTRAVENOUS
  Filled 2020-07-23 (×5): qty 40

## 2020-07-23 MED ORDER — DEXTROSE 50 % IV SOLN: 0.0000 mL | INTRAVENOUS | Status: DC | PRN

## 2020-07-23 MED ORDER — INSULIN ASPART 100 UNIT/ML ~~LOC~~ SOLN
0.0000 [IU] | Freq: Three times a day (TID) | SUBCUTANEOUS | Status: DC
  Administered 2020-07-23: 15 [IU] via SUBCUTANEOUS
  Administered 2020-07-24: 5 [IU] via SUBCUTANEOUS
  Administered 2020-07-24: 11 [IU] via SUBCUTANEOUS
  Administered 2020-07-25: 2 [IU] via SUBCUTANEOUS
  Administered 2020-07-25: 5 [IU] via SUBCUTANEOUS
  Administered 2020-07-25 – 2020-07-26 (×2): 3 [IU] via SUBCUTANEOUS
  Administered 2020-07-26 – 2020-07-27 (×2): 11 [IU] via SUBCUTANEOUS
  Administered 2020-07-27: 15 [IU] via SUBCUTANEOUS

## 2020-07-23 MED ORDER — PIPERACILLIN-TAZOBACTAM 3.375 G IVPB
3.3750 g | Freq: Three times a day (TID) | INTRAVENOUS | Status: DC
  Administered 2020-07-23 – 2020-07-27 (×13): 3.375 g via INTRAVENOUS
  Filled 2020-07-23 (×12): qty 50

## 2020-07-23 MED ORDER — INSULIN REGULAR(HUMAN) IN NACL 100-0.9 UT/100ML-% IV SOLN
INTRAVENOUS | Status: DC
  Administered 2020-07-23: 14 [IU]/h via INTRAVENOUS
  Filled 2020-07-23: qty 100

## 2020-07-23 MED ORDER — DEXTROSE IN LACTATED RINGERS 5 % IV SOLN: INTRAVENOUS | Status: DC

## 2020-07-23 MED ORDER — FENTANYL CITRATE (PF) 100 MCG/2ML IJ SOLN
50.0000 ug | Freq: Once | INTRAMUSCULAR | Status: AC
  Administered 2020-07-23: 50 ug via INTRAVENOUS
  Filled 2020-07-23: qty 2

## 2020-07-23 MED ORDER — IOHEXOL 300 MG/ML  SOLN
100.0000 mL | Freq: Once | INTRAMUSCULAR | Status: AC | PRN
  Administered 2020-07-23: 100 mL via INTRAVENOUS

## 2020-07-23 NOTE — ED Provider Notes (Signed)
[facility] EMERGENCY DEPARTMENT Provider Note   CSN: [id number] Arrival date & time: 07/22/20  2129     History Chief Complaint  Patient presents with  . Bloody Stools/Abdominal Pain     Rhonda [name] is a 60 y.o. female.  The history is provided by the spouse.  GI Problem This is a new problem. The current episode started yesterday. The problem occurs constantly. The problem has been gradually worsening. Associated symptoms include abdominal pain. Pertinent negatives include no chest pain, no headaches and no shortness of breath. Nothing aggravates the symptoms. Nothing relieves the symptoms. She has tried nothing for the symptoms.       Past Medical History:  Diagnosis Date  . Arthritis   . Degenerative disc disease, lumbar   . GERD (gastroesophageal reflux disease)   . Hypertension   . OSA (obstructive sleep apnea)   . Prediabetes   . Sarcoidosis of central nervous system   . Seizures ([place])   . Urinary incontinence     Patient Active Problem List   Diagnosis Date Noted  . Acute lower GI bleeding 07/23/2020  . Hyperosmolar hyperglycemic state (HHS) ([place]) 07/23/2020  . Diverticulitis of large intestine with bleeding 07/23/2020  . Liver lesion 07/23/2020  . 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 HLD (hyperlipidemia) 01/12/2020  . Acute lower UTI 01/12/2020  . AMS (altered mental status) 01/11/2020  . Memory changes 01/08/2020  . Altered mental status   . Hyponatremia   . Speech disturbance   . Demyelinating changes in brain ([place]) 11/13/2019  . Acute CVA (cerebrovascular accident) ([place]) 05/23/2019  . Transaminitis 05/23/2019  . Nausea and vomiting 05/23/2019  . Leukocytosis 05/23/2019  . Thoracic lymphadenopathy 08/26/2018  . Axillary lymphadenopathy 08/26/2018  . Piriformis syndrome of right side 09/29/2016  . OSA (obstructive sleep apnea) 09/27/2015  . Right hip pain 04/11/2015  . Essential hypertension 11/07/2014  . Chronic low back pain  11/07/2014  . Prediabetes 11/07/2014  . Vitamin D deficiency 11/07/2014    Past Surgical History:  Procedure Laterality Date  . ABDOMINAL HYSTERECTOMY    . BREAST EXCISIONAL BIOPSY Right   . BREAST SURGERY     right breast  . CESAREAN SECTION       OB History    Gravida  5   Para      Term      Preterm      AB  1   Living  4     SAB  1   IAB      Ectopic      Multiple  1   Live Births  4           Family History  Problem Relation Age of Onset  . Cancer Mother   . Hypertension Sister   . Diabetes Sister   . Diabetes Maternal Grandmother     Social History   Tobacco Use  . Smoking status: Never Smoker  . Smokeless tobacco: Never Used  Vaping Use  . Vaping Use: Never used  Substance Use Topics  . Alcohol use: No  . Drug use: No    Home Medications Prior to Admission medications   Medication Sig Start Date End Date Taking? Authorizing Provider  acetaminophen (TYLENOL) 650 MG CR tablet Take 1,300 mg by mouth 2 (two) times daily as needed (arthritis pain).    Yes [provider]  aspirin 81 MG EC tablet Take 81 mg by mouth daily. 03/01/20  Yes [provider]  b complex vitamins tablet Take 1 tablet by mouth daily.   Yes [provider]  Calcium Carb-Cholecalciferol (CALCIUM+D3 PO) Take 1 tablet by mouth daily with lunch.   Yes [provider]  glucosamine-chondroitin 500-400 MG tablet Take 1 tablet by mouth 3 (three) times daily. Patient taking differently: Take 2 tablets by mouth daily with lunch. 04/11/15  Yes Funches, Josalyn, MD  melatonin 3 MG TABS tablet Take 1 tablet (3 mg total) by mouth at bedtime. 01/17/20  Yes Regalado, Belkys A, MD  metoprolol tartrate (LOPRESSOR) 25 MG tablet Take 1 tablet (25 mg total) by mouth 2 (two) times daily. 01/17/20  Yes Regalado, Belkys A, MD  Multiple Vitamin (MULTIVITAMIN WITH MINERALS) TABS tablet Take 1 tablet by mouth daily with lunch.    Yes [provider]   Omega-3 Fatty Acids (FISH OIL) 1000 MG CAPS Take 1,000 mg by mouth daily with lunch.    Yes [provider]  pantoprazole (PROTONIX) 40 MG tablet Take 1 tablet (40 mg total) by mouth daily. 01/17/20  Yes Regalado, Belkys A, MD  polyethylene glycol (MIRALAX / GLYCOLAX) 17 g packet Take 17 g by mouth 2 (two) times daily. Patient taking differently: Take 17 g by mouth daily as needed for mild constipation. 01/17/20  Yes Regalado, Belkys A, MD  predniSONE (DELTASONE) 20 MG tablet Take 40 mg by mouth in the morning and at bedtime. 07/17/20  Yes [provider]  PRESCRIPTION MEDICATION Inhale into the lungs See admin instructions. CPAP- At bedtime   Yes [provider]  atorvastatin (LIPITOR) 80 MG tablet Take 1 tablet (80 mg total) by mouth daily at 6 PM. Patient taking differently: Take 80 mg by mouth at bedtime. 05/24/19 01/11/20  Dessa Phi, DO    Allergies    Aspirin and Augmentin [amoxicillin-pot clavulanate]  Review of Systems   Review of Systems  Unable to perform ROS: Mental status change  Respiratory: Negative for shortness of breath.   Cardiovascular: Negative for chest pain.  Gastrointestinal: Positive for abdominal pain.  Neurological: Negative for headaches.    Physical Exam Updated Vital Signs BP 128/88   Pulse (!) 115   Temp 97.9 F (36.6 C)   Resp (!) 29   Ht 5\' 5"  (1.651 m)   Wt 82 kg   SpO2 100%   BMI 30.08 kg/m   Physical Exam Vitals and nursing note reviewed.  Constitutional:      Appearance: She is well-developed and well-nourished.  HENT:     Head: Normocephalic and atraumatic.     Mouth/Throat:     Mouth: Mucous membranes are moist.     Pharynx: Oropharynx is clear.  Cardiovascular:     Rate and Rhythm: Normal rate and regular rhythm.  Pulmonary:     Effort: No respiratory distress.     Breath sounds: No stridor.  Abdominal:     General: Abdomen is flat. There is no distension.  Musculoskeletal:     Cervical back: Normal  range of motion.  Skin:    General: Skin is warm and dry.     Coloration: Skin is not jaundiced or pale.  Neurological:     Mental Status: She is alert. Mental status is at baseline.     ED Results / Procedures / Treatments   Labs (all labs ordered are listed, but only abnormal results are displayed) Labs Reviewed  CBC WITH DIFFERENTIAL/PLATELET - Abnormal; Notable for the following components:      Result Value   RBC  2.78 (*)    Hemoglobin 8.6 (*)    HCT 25.4 (*)    RDW 16.3 (*)    nRBC 2.1 (*)    Neutro Abs 9.4 (*)    Lymphs Abs 0.4 (*)    nRBC 3 (*)    All other components within normal limits  COMPREHENSIVE METABOLIC PANEL - Abnormal; Notable for the following components:   Sodium 124 (*)    Chloride 90 (*)    CO2 21 (*)    Glucose, Bld 563 (*)    Calcium 8.7 (*)    Total Protein 5.1 (*)    Albumin 2.5 (*)    ALT 50 (*)    All other components within normal limits  HEMOGLOBIN - Abnormal; Notable for the following components:   Hemoglobin 9.2 (*)    All other components within normal limits  HEMATOCRIT - Abnormal; Notable for the following components:   HCT 27.6 (*)    All other components within normal limits  PROTIME-INR  URINALYSIS, ROUTINE W REFLEX MICROSCOPIC  HEMOGLOBIN A1C  HEMOGLOBIN  HEMOGLOBIN  HEMOGLOBIN  HEMATOCRIT  HEMATOCRIT  HEMATOCRIT  LIPASE, BLOOD  BASIC METABOLIC PANEL  BASIC METABOLIC PANEL  I-STAT BETA HCG BLOOD, ED ([facility], [facility], [facility] ONLY)  TYPE AND SCREEN  ABO/RH    EKG None  Radiology CT ABDOMEN PELVIS W CONTRAST  Result Date: 07/23/2020 CLINICAL DATA:  Bloody stools, abdominal pain EXAM: CT ABDOMEN AND PELVIS WITH CONTRAST TECHNIQUE: Multidetector CT imaging of the abdomen and pelvis was performed using the standard protocol following bolus administration of intravenous contrast. CONTRAST:  144mL OMNIPAQUE IOHEXOL 300 MG/ML  SOLN COMPARISON:  CT 11/19/2019 FINDINGS: Lower chest: Lung bases are clear. Normal heart size. No pericardial  effusion. Hepatobiliary: Diffuse hepatic hypoattenuation compatible with hepatic steatosis. Hypoattenuating focus along the posterior aspect segment 4 near the falciform ligament and gallbladder fossa measuring approximately 2 cm in size (3/18) possible cyst versus hemangioma, incompletely characterized on this exam, less readily apparent on comparison given some motion artifact in this vicinity. No concerning focal liver lesion is seen. Gallbladder is largely decompressed with otherwise normal appearance of the gallbladder and biliary tree. Pancreas: Question some faint hazy stranding about the pancreatic head and neck (6/34. Uniform enhancement of the pancreatic parenchyma. No biliary ductal dilatation. Spleen: Normal in size. No concerning splenic lesions. Adrenals/Urinary Tract: Normal adrenal glands. No concerning adrenal lesions. Kidneys are normally located with symmetric enhancement and excretion. Subcentimeter hypertension focus in the interpolar left kidney, too small to fully characterize on CT imaging but statistically likely benign. No suspicious renal lesion, urolithiasis or hydronephrosis. Urinary bladder is unremarkable for the degree of distention. Stomach/Bowel: Distal esophagus, stomach and duodenum are free of acute abnormality. No small bowel thickening or dilatation. A normal appendix is visualized. No colonic dilatation or wall thickening. Scattered pancolonic diverticula. Some focal stranding and mild mural thickening is noted in the proximal sigmoid colon centered upon a possible culprit diverticulum (7/44). Vascular/Lymphatic: Atherosclerotic calcifications within the abdominal aorta and branch vessels. No aneurysm or ectasia. No enlarged abdominopelvic lymph nodes. Reproductive: Uterus is surgically absent. No concerning adnexal lesions. Quiescent appearance of the retained ovarian parenchyma. Other: Hazy stranding adjacent the pancreatic head and proximal body. Additional mild stranding  and thickening adjacent the proximal sigmoid colon. No free air, fluid, abscess or collection is seen in the abdomen or pelvis. No bowel containing hernias. Mild body wall edema. Musculoskeletal: Lobular fat attenuation lesion within the gluteus medius on the left measuring approximately 5.1  x 3.9 cm, previously 5.1 x 2.5 cm. Favor a slow growing lipoma in the absence of more aggressive features. The osseous structures appear diffusely demineralized which may limit detection of small or nondisplaced fractures. No acute osseous abnormality or suspicious osseous lesion. Grade 1 anterolisthesis L4 on L5. No associated spondylolysis. Multilevel discogenic and facet degenerative changes present throughout the lumbar levels some moderate to severe canal stenosis at the L4-5, L5-S1 levels as well as moderate to severe foraminal impingement. IMPRESSION: 1. Pancolonic diverticulosis with slightly more focal stranding and thickening adjacent a culprit diverticulum in the proximal sigmoid. No extraluminal gas, abscess or collection. Findings consistent with an acute uncomplicated diverticulitis. 2. Additional faint mild stranding adjacent the pancreatic head and neck. Correlate with lipase to assess for pancreatitis. 3. Hepatic steatosis. 4. Hypoattenuating focus along the posterior aspect segment 4 near the falciform ligament and gallbladder fossa measuring approximately 2 cm in size, incompletely characterized on this exam, possible cyst versus hemangioma, less readily apparent on comparison given some motion artifact in this vicinity. Could consider assessment with right upper quadrant ultrasound on a nonemergent, outpatient basis. 5. Lobular fat attenuation lesion within the left gluteus medius on the left measuring approximately 5.1 x 3.9 cm, previously 5.1 x 2.5 cm. Favor a slow growing lipoma in the absence of more aggressive features. Recommend close clinical follow-up with repeat imaging as warranted or if new  symptoms develop. 6. Multilevel discogenic and facet degenerative changes throughout the lumbar levels with some moderate to severe canal stenosis and foraminal impingement at the L4-5, L5-S1 levels. Grade 1 anterolisthesis L4 on L5 as well. 7. Aortic Atherosclerosis (ICD10-I70.0). Electronically Signed   By: Lovena Le M.D.   On: 07/23/2020 03:57    Procedures .Critical Care Performed by: Merrily Pew, MD Authorized by: Merrily Pew, MD   Critical care provider statement:    Critical care time (minutes):  45   Critical care was necessary to treat or prevent imminent or life-threatening deterioration of the following conditions:  Shock   Critical care was time spent personally by me on the following activities:  Discussions with consultants, evaluation of patient's response to treatment, examination of patient, ordering and performing treatments and interventions, ordering and review of laboratory studies, ordering and review of radiographic studies, pulse oximetry, re-evaluation of patient's condition, obtaining history from patient or surrogate and review of old charts     Medications Ordered in ED Medications  insulin regular, human (MYXREDLIN) 100 units/ 100 mL infusion (has no administration in time range)  lactated ringers infusion (has no administration in time range)  dextrose 5 % in lactated ringers infusion (has no administration in time range)  dextrose 50 % solution 0-50 mL (has no administration in time range)  methylPREDNISolone sodium succinate (SOLU-MEDROL) 40 mg/mL injection 40 mg (has no administration in time range)  0.9 %  sodium chloride infusion (Manually program via Guardrails IV Fluids) (has no administration in time range)  fentaNYL (SUBLIMAZE) injection 25-50 mcg (has no administration in time range)  ondansetron (ZOFRAN) tablet 4 mg (has no administration in time range)    Or  ondansetron (ZOFRAN) injection 4 mg (has no administration in time range)   piperacillin-tazobactam (ZOSYN) IVPB 3.375 g (has no administration in time range)  piperacillin-tazobactam (ZOSYN) IVPB 3.375 g (has no administration in time range)  pantoprazole (PROTONIX) injection 40 mg (has no administration in time range)  fentaNYL (SUBLIMAZE) injection 50 mcg (50 mcg Intravenous Given 07/23/20 0205)  iohexol (OMNIPAQUE) 300 MG/ML solution 100  mL (100 mLs Intravenous Contrast Given 07/23/20 0334)  lactated ringers bolus 1,000 mL (1,000 mLs Intravenous New Bag/Given 07/23/20 0421)    ED Course  I have reviewed the triage vital signs and the nursing notes.  Pertinent labs & imaging results that were available during my care of the patient were reviewed by me and considered in my medical decision making (see chart for details).    MDM Rules/Calculators/A&P                          Hyperglycemia without DKA - insulin started.  Hematochezia - no obvious hemorrhoid bleed on exam, diverticulitis on ct likely cause. No indication for transfusion at this time.  Dr Hilarie Fredrickson added to treatment team for GI consult in AM per institutionally accepted practice non-emergent situations such as this.   Final Clinical Impression(s) / ED Diagnoses Final diagnoses:  Hemorrhagic shock ([place])  Gastrointestinal hemorrhage, unspecified gastrointestinal hemorrhage type    Rx / DC Orders ED Discharge Orders    None       Saxon Crosby, Corene Cornea, MD 07/23/20 [phone]

## 2020-07-23 NOTE — Progress Notes (Addendum)
Pharmacy Antibiotic Note  KIRTI CARL is a 60 y.o. female admitted on 07/22/2020 with intra-abdominal infection.  Pharmacy has been consulted for Zosyn dosing.  Noted PCN allergy though appears to be an infusion site reaction or infiltration as opposed to true allergy.  Plan: Zosyn 3.375g IV q8h (4-hour infusion).  Height: 5\' 5"  (165.1 cm) Weight: 82 kg (180 lb 12.4 oz) IBW/kg (Calculated) : 57  Temp (24hrs), Avg:97.9 F (36.6 C), Min:97.9 F (36.6 C), Max:97.9 F (36.6 C)  Recent Labs  Lab 07/23/20 0213  WBC 9.9  CREATININE 0.99    Estimated Creatinine Clearance: 64.7 mL/min (by C-G formula based on SCr of 0.99 mg/dL).    Allergies  Allergen Reactions  . Aspirin Nausea And Vomiting  . Augmentin [Amoxicillin-Pot Clavulanate] Swelling and Other (See Comments)    Hand swelled at IV site     Thank you for allowing pharmacy to be a part of this patient's care.  Wynona Neat, PharmD, BCPS  07/23/2020 4:20 AM

## 2020-07-23 NOTE — ED Notes (Signed)
Dinner Tray Ordered @ 1700. 

## 2020-07-23 NOTE — Consult Note (Addendum)
UNASSIGNED PATIENT Reason for Consult: Rectal bleeding with anemia. Referring Physician: [facility] hospitalist.  Rhonda [name] is an 60 y.o. female.  HPI: Rhonda [name] is a 60 year old black female with multiple medical problems listed below who was in her usual state of health till a couple of days ago when she noted a small amount of hematochezia.  Her symptoms seem to worsen yesterday when she noticed significant rectal bleeding with BM's. She denied having any abdominal pain. She has intermittent nausea and vomiting but denies any ground emesis or hematemesis.  Has a history of constipation for which she takes MiraLAX on a as needed basis.  She also takes pantoprazole for acid reflux but denies having any dysphagia odynophagia at this time.  Most of the history been procured from discussion with the patient's husband and review of her chart.  Her hemoglobin did drop from 13.9 g/dL in January this year to 8.6 g/dL in the ER on admission.  CT of the abdomen pelvis on admission revealed scattered pandiverticulosis with focal stranding and mild mural thickening in the proximal sigmoid colon along with diffuse hepatic hypoattenuation compatible with hepatic steatosis. Mild stranding was noted adjacent to the pancreatic head along with a hypoattenuation along the posterior aspect of segment 4 near the falciform ligament question cyst versus hemangioma.  Patient denies using nonsteroidals and tends to use Tylenol for aches and pains.  Her husband tells me she has had a stroke but as per my review of the record she has neurosarcoidosis. The husband also gives a history of having had a colonoscopy about 2 years ago when colonic polyps were removed but this was within the [facility] system and he cannot remember the name of the gastroenterologist who did her procedure.  Past Medical History:  Diagnosis Date  . Arthritis   . Degenerative disc disease, lumbar   . GERD (gastroesophageal reflux disease)   . Hypertension    . OSA (obstructive sleep apnea)   . Prediabetes   . Sarcoidosis of central nervous system   . Seizures ([place])   . Urinary incontinence    Past Surgical History:  Procedure Laterality Date  . ABDOMINAL HYSTERECTOMY    . BREAST EXCISIONAL BIOPSY Right   . BREAST SURGERY     right breast  . CESAREAN SECTION     Family History  Problem Relation Age of Onset  . Cancer Mother   . Hypertension Sister   . Diabetes Sister   . Diabetes Maternal Grandmother    Social History:  reports that she has never smoked. She has never used smokeless tobacco. She reports that she does not drink alcohol and does not use drugs.  Allergies:  Allergies  Allergen Reactions  . Aspirin Nausea And Vomiting  . Augmentin [Amoxicillin-Pot Clavulanate] Swelling and Other (See Comments)    Hand swelled at IV site   Medications: I have reviewed the patient's current medications.  Results for orders placed or performed during the hospital encounter of 07/22/20 (from the past 48 hour(s))  Type and screen     Status: None   Collection Time: 07/23/20  2:12 AM  Result Value Ref Range   ABO/RH(D) B POS    Antibody Screen NEG    Sample Expiration      07/26/2020,2359 Performed at [facility], [place] [address] [place], [place] [address]   CBC with Differential     Status: Abnormal   Collection Time: 07/23/20  2:13 AM  Result Value Ref Range  WBC 9.9 4.0 - 10.5 K/uL   RBC 2.78 (L) 3.87 - 5.11 MIL/uL   Hemoglobin 8.6 (L) 12.0 - 15.0 g/dL   HCT 25.4 (L) 36.0 - 46.0 %   MCV 91.4 80.0 - 100.0 fL   MCH 30.9 26.0 - 34.0 pg   MCHC 33.9 30.0 - 36.0 g/dL   RDW 16.3 (H) 11.5 - 15.5 %   Platelets 231 150 - 400 K/uL   nRBC 2.1 (H) 0.0 - 0.2 %   Neutrophils Relative % 95 %   Neutro Abs 9.4 (H) 1.7 - 7.7 K/uL   Lymphocytes Relative 4 %   Lymphs Abs 0.4 (L) 0.7 - 4.0 K/uL   Monocytes Relative 1 %   Monocytes Absolute 0.1 0.1 - 1.0 K/uL   Eosinophils Relative 0 %   Eosinophils Absolute 0.0 0.0 - 0.5 K/uL    Basophils Relative 0 %   Basophils Absolute 0.0 0.0 - 0.1 K/uL   WBC Morphology See Note     Comment: Mild Left Shift. 1 to 5% Metas and Myelos, Occ Pro Noted.   nRBC 3 (H) 0 /100 WBC   Abs Immature Granulocytes 0.00 0.00 - 0.07 K/uL   Acanthocytes PRESENT    Tear Drop Cells PRESENT    Polychromasia PRESENT     Comment: Performed at [facility], [place] [address], [place], [place] [address]  Comprehensive metabolic panel     Status: Abnormal   Collection Time: 07/23/20  2:13 AM  Result Value Ref Range   Sodium 124 (L) 135 - 145 mmol/L    Comment: 3   Potassium 4.9 3.5 - 5.1 mmol/L   Chloride 90 (L) 98 - 111 mmol/L   CO2 21 (L) 22 - 32 mmol/L   Glucose, Bld 563 (HH) 70 - 99 mg/dL    Comment: Glucose reference range applies only to samples taken after fasting for at least 8 hours. CRITICAL RESULT CALLED TO, READ BACK BY AND VERIFIED WITH:  J. NEWTON RN @0322  07/23/2020 K. SANDERS    BUN 19 6 - 20 mg/dL   Creatinine, Ser 0.99 0.44 - 1.00 mg/dL   Calcium 8.7 (L) 8.9 - 10.3 mg/dL   Total Protein 5.1 (L) 6.5 - 8.1 g/dL   Albumin 2.5 (L) 3.5 - 5.0 g/dL   AST 35 15 - 41 U/L   ALT 50 (H) 0 - 44 U/L   Alkaline Phosphatase 122 38 - 126 U/L   Total Bilirubin 0.9 0.3 - 1.2 mg/dL   GFR, Estimated >60 >60 mL/min    Comment: (NOTE) Calculated using the CKD-EPI Creatinine Equation (2021)    Anion gap 13 5 - 15    Comment: Performed at [facility], [place] [address], [place], [place] [address]  Protime-INR     Status: None   Collection Time: 07/23/20  3:47 AM  Result Value Ref Range   Prothrombin Time 12.9 11.4 - 15.2 seconds   INR 1.0 0.8 - 1.2    Comment: (NOTE) INR goal varies based on device and disease states. Performed at [facility], [place] [address] [place], [place] [address]   I-Stat beta hCG blood, ED     Status: None   Collection Time: 07/23/20  4:14 AM  Result Value Ref Range   I-stat hCG, quantitative <5.0 <5 mIU/mL   Comment 3            Comment:   GEST.  AGE      CONC.  (mIU/mL)   <=  1 WEEK        5 - 50     2 WEEKS       50 - 500     3 WEEKS       100 - 10,000     4 WEEKS     1,000 - 30,000        FEMALE AND NON-PREGNANT FEMALE:     LESS THAN 5 mIU/mL   Hemoglobin A1c     Status: Abnormal   Collection Time: 07/23/20  4:18 AM  Result Value Ref Range   Hgb A1c MFr Bld 11.8 (H) 4.8 - 5.6 %    Comment: (NOTE) Pre diabetes:          5.7%-6.4%  Diabetes:              >6.4%  Glycemic control for   <7.0% adults with diabetes    Mean Plasma Glucose 291.96 mg/dL    Comment: Performed at [facility] [address], [place], [place] [address]  ABO/Rh     Status: None   Collection Time: 07/23/20  4:20 AM  Result Value Ref Range   ABO/RH(D)      B POS Performed at [facility] [address], [place], [place] [address]   Hemoglobin     Status: Abnormal   Collection Time: 07/23/20  4:20 AM  Result Value Ref Range   Hemoglobin 9.2 (L) 12.0 - 15.0 g/dL    Comment: Performed at [facility], [place] [address] [place], [place] [address]  Hematocrit     Status: Abnormal   Collection Time: 07/23/20  4:20 AM  Result Value Ref Range   HCT 27.6 (L) 36.0 - 46.0 %    Comment: Performed at [facility], [place] [address] [place], [place] [address]  Lipase, blood     Status: Abnormal   Collection Time: 07/23/20  4:32 AM  Result Value Ref Range   Lipase 64 (H) 11 - 51 U/L    Comment: Performed at [facility], [place] [address], [place], [place] [address]  Basic metabolic panel     Status: Abnormal   Collection Time: 07/23/20  4:32 AM  Result Value Ref Range   Sodium 125 (L) 135 - 145 mmol/L   Potassium 4.7 3.5 - 5.1 mmol/L   Chloride 92 (L) 98 - 111 mmol/L   CO2 20 (L) 22 - 32 mmol/L   Glucose, Bld 492 (H) 70 - 99 mg/dL    Comment: Glucose reference range applies only to samples taken after fasting for at least 8 hours.   BUN 19 6 - 20 mg/dL   Creatinine, Ser 0.89 0.44 - 1.00 mg/dL   Calcium 8.4 (L) 8.9 - 10.3  mg/dL   GFR, Estimated >60 >60 mL/min    Comment: (NOTE) Calculated using the CKD-EPI Creatinine Equation (2021)    Anion gap 13 5 - 15    Comment: Performed at [facility] [address] [place],  [address]  CBG monitoring, ED     Status: Abnormal   Collection Time: 07/23/20  5:25 AM  Result Value Ref Range   Glucose-Capillary 416 (H) 70 - 99 mg/dL    Comment: Glucose reference range applies only to samples taken after fasting for at least 8 hours.  CBG monitoring, ED     Status: Abnormal   Collection Time: 07/23/20  6:10 AM  Result Value Ref Range   Glucose-Capillary  389 (H) 70 - 99 mg/dL    Comment: Glucose reference range applies only to samples taken after fasting for at least 8 hours.  CBG monitoring, ED     Status: Abnormal   Collection Time: 07/23/20  6:57 AM  Result Value Ref Range   Glucose-Capillary 340 (H) 70 - 99 mg/dL    Comment: Glucose reference range applies only to samples taken after fasting for at least 8 hours.  Urinalysis, Routine w reflex microscopic Urine, Random     Status: Abnormal   Collection Time: 07/23/20  7:31 AM  Result Value Ref Range   Color, Urine STRAW (A) YELLOW   APPearance CLEAR CLEAR   Specific Gravity, Urine 1.025 1.005 - 1.030   pH 5.0 5.0 - 8.0   Glucose, UA >=500 (A) NEGATIVE mg/dL   Hgb urine dipstick SMALL (A) NEGATIVE   Bilirubin Urine NEGATIVE NEGATIVE   Ketones, ur 20 (A) NEGATIVE mg/dL   Protein, ur NEGATIVE NEGATIVE mg/dL   Nitrite NEGATIVE NEGATIVE   Leukocytes,Ua LARGE (A) NEGATIVE   RBC / HPF 0-5 0 - 5 RBC/hpf   WBC, UA 21-50 0 - 5 WBC/hpf   Bacteria, UA RARE (A) NONE SEEN   Squamous Epithelial / LPF 0-5 0 - 5   Mucus PRESENT    Budding Yeast PRESENT     Comment: Performed at [facility], [address], [place], [place] [address]  CBG monitoring, ED     Status: Abnormal   Collection Time: 07/23/20  9:42 AM  Result Value Ref Range   Glucose-Capillary 139 (H) 70 - 99 mg/dL    Comment: Glucose  reference range applies only to samples taken after fasting for at least 8 hours.  Hemoglobin     Status: Abnormal   Collection Time: 07/23/20 10:44 AM  Result Value Ref Range   Hemoglobin 7.5 (L) 12.0 - 15.0 g/dL    Comment: Performed at [facility] [address] [place], [place] [address]  Hematocrit     Status: Abnormal   Collection Time: 07/23/20 10:44 AM  Result Value Ref Range   HCT 22.2 (L) 36.0 - 46.0 %    Comment: Performed at [facility], [place] [address], [place], [place] [address]  Beta-hydroxybutyric acid     Status: Abnormal   Collection Time: 07/23/20 10:44 AM  Result Value Ref Range   Beta-Hydroxybutyric Acid 0.37 (H) 0.05 - 0.27 mmol/L    Comment: Performed at [facility] [address] [place], [place] [address]  Basic metabolic panel     Status: Abnormal   Collection Time: 07/23/20 12:32 PM  Result Value Ref Range   Sodium 133 (L) 135 - 145 mmol/L   Potassium 4.7 3.5 - 5.1 mmol/L   Chloride 98 98 - 111 mmol/L   CO2 21 (L) 22 - 32 mmol/L   Glucose, Bld 253 (H) 70 - 99 mg/dL    Comment: Glucose reference range applies only to samples taken after fasting for at least 8 hours.   BUN 18 6 - 20 mg/dL   Creatinine, Ser 0.72 0.44 - 1.00 mg/dL   Calcium 8.2 (L) 8.9 - 10.3 mg/dL   GFR, Estimated >60 >60 mL/min    Comment: (NOTE) Calculated using the CKD-EPI Creatinine Equation (2021)    Anion gap 14 5 - 15    Comment: Performed at [facility] [address], [place], [place] [address]   CT ABDOMEN PELVIS W CONTRAST  Result Date: 07/23/2020 CLINICAL DATA:  Bloody stools, abdominal pain EXAM: CT ABDOMEN AND PELVIS WITH CONTRAST TECHNIQUE: Multidetector CT imaging of the abdomen and pelvis was performed using the standard protocol following bolus administration of intravenous contrast. CONTRAST:  170mL OMNIPAQUE IOHEXOL 300 MG/ML  SOLN COMPARISON:  CT 11/19/2019 FINDINGS: Lower chest: Lung bases are clear. Normal heart size. No pericardial  effusion. Hepatobiliary: Diffuse hepatic hypoattenuation compatible with hepatic steatosis. Hypoattenuating focus along the posterior aspect segment 4 near the falciform ligament and gallbladder fossa measuring approximately 2 cm in size (3/18) possible cyst versus hemangioma, incompletely characterized on this exam, less readily apparent on comparison given some motion artifact in this vicinity. No concerning focal liver lesion is seen. Gallbladder is largely decompressed with otherwise normal appearance of the gallbladder and biliary tree. Pancreas: Question some faint hazy stranding about the pancreatic head and neck (6/34. Uniform enhancement of the pancreatic parenchyma. No biliary ductal dilatation. Spleen: Normal in size. No concerning splenic lesions. Adrenals/Urinary Tract: Normal adrenal glands. No concerning adrenal lesions. Kidneys are normally located with symmetric enhancement and excretion. Subcentimeter hypertension focus in the interpolar left kidney, too small to fully characterize on CT imaging but statistically likely benign. No suspicious renal lesion, urolithiasis or hydronephrosis. Urinary bladder is unremarkable for the degree of distention. Stomach/Bowel: Distal esophagus, stomach and duodenum are free of acute abnormality. No small bowel thickening or dilatation. A normal appendix is visualized. No colonic dilatation or wall thickening. Scattered pancolonic diverticula. Some focal stranding and mild mural thickening is noted in the proximal sigmoid colon centered upon a possible culprit diverticulum (7/44). Vascular/Lymphatic: Atherosclerotic calcifications within the abdominal aorta and branch vessels. No aneurysm or ectasia. No enlarged abdominopelvic lymph nodes. Reproductive: Uterus is surgically absent. No concerning adnexal lesions. Quiescent appearance of the retained ovarian parenchyma. Other: Hazy stranding adjacent the pancreatic head and proximal body. Additional mild stranding  and thickening adjacent the proximal sigmoid colon. No free air, fluid, abscess or collection is seen in the abdomen or pelvis. No bowel containing hernias. Mild body wall edema. Musculoskeletal: Lobular fat attenuation lesion within the gluteus medius on the left measuring approximately 5.1 x 3.9 cm, previously 5.1 x 2.5 cm. Favor a slow growing lipoma in the absence of more aggressive features. The osseous structures appear diffusely demineralized which may limit detection of small or nondisplaced fractures. No acute osseous abnormality or suspicious osseous lesion. Grade 1 anterolisthesis L4 on L5. No associated spondylolysis. Multilevel discogenic and facet degenerative changes present throughout the lumbar levels some moderate to severe canal stenosis at the L4-5, L5-S1 levels as well as moderate to severe foraminal impingement. IMPRESSION: 1. Pancolonic diverticulosis with slightly more focal stranding and thickening adjacent a culprit diverticulum in the proximal sigmoid. No extraluminal gas, abscess or collection. Findings consistent with an acute uncomplicated diverticulitis. 2. Additional faint mild stranding adjacent the pancreatic head and neck. Correlate with lipase to assess for pancreatitis. 3. Hepatic steatosis. 4. Hypoattenuating focus along the posterior aspect segment 4 near the falciform ligament and gallbladder fossa measuring approximately 2 cm in size, incompletely characterized on this exam, possible cyst versus hemangioma, less readily apparent on comparison given some motion artifact in this vicinity. Could consider assessment with right upper quadrant ultrasound on a nonemergent, outpatient basis. 5. Lobular fat attenuation lesion within the left gluteus medius on the left measuring approximately 5.1 x 3.9 cm, previously 5.1 x 2.5 cm. Favor a slow growing lipoma in the absence of more aggressive features. Recommend close clinical follow-up with repeat  imaging as warranted or if new  symptoms develop. 6. Multilevel discogenic and facet degenerative changes throughout the lumbar levels with some moderate to severe canal stenosis and foraminal impingement at the L4-5, L5-S1 levels. Grade 1 anterolisthesis L4 on L5 as well. 7. Aortic Atherosclerosis (ICD10-I70.0). Electronically Signed   By: Lovena Le M.D.   On: 07/23/2020 03:57   Review of Systems  Constitutional: Positive for activity change and fatigue. Negative for appetite change and unexpected weight change.  HENT: Negative.   Eyes: Negative.   Respiratory: Negative.   Cardiovascular: Negative.   Gastrointestinal: Positive for anal bleeding, blood in stool, constipation, nausea and vomiting. Negative for abdominal distention, abdominal pain, diarrhea and rectal pain.  Endocrine: Negative.   Genitourinary: Negative.   Musculoskeletal: Positive for arthralgias, gait problem and myalgias.  Allergic/Immunologic: Negative.   Neurological: Positive for weakness.  Hematological: Negative.   Psychiatric/Behavioral: Positive for sleep disturbance.   Blood pressure 108/82, pulse (!) 115, temperature 98.1 F (36.7 C), temperature source Oral, resp. rate (!) 27, height 5\' 5"  (1.651 m), weight 82 kg, SpO2 99 %. Physical Exam Constitutional:      General: She is not in acute distress.    Appearance: She is obese. She is ill-appearing.  HENT:     Head: Normocephalic and atraumatic.     Mouth/Throat:     Mouth: Mucous membranes are dry.  Eyes:     Extraocular Movements: Extraocular movements intact.  Cardiovascular:     Rate and Rhythm: Normal rate and regular rhythm.  Pulmonary:     Effort: Pulmonary effort is normal.     Breath sounds: Normal breath sounds.  Abdominal:     General: Bowel sounds are normal. There is no distension.     Palpations: There is no mass.     Tenderness: There is no abdominal tenderness. There is no guarding.     Hernia: No hernia is present.  Musculoskeletal:     Cervical back: Neck  supple.  Skin:    General: Skin is warm and dry.  Neurological:     Mental Status: She is oriented to person, place, and time.    Assessment/Plan: 1) Rectal bleeding with a 5 gm drop in her hemoglobin-we will plan to do an EGD tomorrow to rule out peptic ulcer disease versus a bleeding AVM.  If the EGD is unrevealing a colonoscopy will be planned. The patient feels very weak and I do not feel she will be able to tolerate a colonoscopy prep tonight. 2) Chronic constipation on MiraLAX. 3) GERD on Protonix. 4) ?Sigmoid diverticulosis on CT scan done on admission-on Zosyn. 5) Severe hepatic steatosis/obesity. 6) AODM with glucose levels of 563 on admission; on sliding scale coverage with insulin. 7) Neurosarcoidosis ?seizure disorder. 8) Hypertension/OSA. 9) Degenerative disc disease/arthritis   Juanita Craver 07/23/2020, 3:05 PM

## 2020-07-23 NOTE — Progress Notes (Signed)
Inpatient Diabetes Program Recommendations  AACE/ADA: New Consensus Statement on Inpatient Glycemic Control (2015)  Target Ranges:  Prepandial:   less than 140 mg/dL      Peak postprandial:   less than 180 mg/dL (1-2 hours)      Critically ill patients:  140 - 180 mg/dL   Lab Results  Component Value Date   GLUCAP 139 (H) 07/23/2020   HGBA1C 11.8 (H) 07/23/2020    Review of Glycemic Control  Diabetes history: Pre-diabetes Outpatient Diabetes medications: None Current orders for Inpatient glycemic control: IV insulin per [facility]  On Solumedrol 40 Q12H HgbA1C - 11.8% - likely not accurate with low H/H Order Living Well with Diabetes book  Inpatient Diabetes Program Recommendations:     When ready for transitioning off drip:  Lantus 15 units Q24H - give 2H prior to discontinuation of drip. Novolog 0-15 units Q4H X 12H then TID with meals and 0-5 HS. Will likely need meal coverage insulin since taking steroids.   Will order OP Diabetes Education consult for new-onset DM2.   See pt on 3/8 regarding new diagnosis. Doubtful HgbA1C is accurate with low H/H.   Thank you. Lorenda Peck, RD, LDN, CDE Inpatient Diabetes Coordinator [phone]

## 2020-07-23 NOTE — H&P (View-Only) (Signed)
Thsi aptient is a 60 yr old woman who presented to [facility] on 07/22/2020 with complaints of acute lower GI bleeding and pain. She was found to have acute hemorrhagic diverticulitis with acute blood loss anemia. She was also found to have hyperosmolar hyperglycemia. She has been given pain control, IV antibiotics and IV fluids. She has also been found to have a UTI with hematuria.  She was admitted by my colleague early this morning.  The patient's vitals are stable. Follow up labs are improved except that her hemoglobin continues to drift down.  The patient is resting on the gurney while awaiting a bed upstairs. She continues to have pain, although she states that it is better.  Heart and lung exam is within normal limits. Abdomen is diffusely tender with increased tenderness in the left lower quadrant. Extremities have no cyanosis, clubbing, or edema.   Urine culture and blood cultures are pending.

## 2020-07-23 NOTE — H&P (View-Only) (Signed)
UNASSIGNED PATIENT Reason for Consult: Rectal bleeding with anemia. Referring Physician: [facility] hospital[facility].  Rhonda [name] is an 60 y.o. female.  HPI: Rhonda [name] is a 60 year old black female with multiple medical problems listed below who was in her usual state of health till a couple of days ago when she noted a small amount of hematochezia.  Her symptoms seem to worsen yesterday when she noticed significant rectal bleeding with BM's. She denied having any abdominal pain. She has intermittent nausea and vomiting but denies any ground emesis or hematemesis.  Has a history of constipation for which she takes MiraLAX on a as needed basis.  She also takes pantoprazole for acid reflux but denies having any dysphagia odynophagia at this time.  Most of the history been procured from discussion with the patient's husband and review of her chart.  Her hemoglobin did drop from 13.9 g/dL in January this year to 8.6 g/dL in the ER on admission.  CT of the abdomen pelvis on admission revealed scattered pandiverticulosis with focal stranding and mild mural thickening in the proximal sigmoid colon along with diffuse hepatic hypoattenuation compatible with hepatic steatosis. Mild stranding was noted adjacent to the pancreatic head along with a hypoattenuation along the posterior aspect of segment 4 near the falciform ligament question cyst versus hemangioma.  Patient denies using nonsteroidals and tends to use Tylenol for aches and pains.  Her husband tells me she has had a stroke but as per my review of the record she has neurosarcoidosis. The husband also gives a history of having had a colonoscopy about 2 years ago when colonic polyps were removed but this was within the [facility] system and he cannot remember the name of the gastroenterologist who did her procedure.  Past Medical History:  Diagnosis Date  . Arthritis   . Degenerative disc disease, lumbar   . GERD (gastroesophageal reflux disease)   . Hypertension    . OSA (obstructive sleep apnea)   . Prediabetes   . Sarcoidosis of central nervous system   . Seizures ([place])   . Urinary incontinence    Past Surgical History:  Procedure Laterality Date  . ABDOMINAL HYSTERECTOMY    . BREAST EXCISIONAL BIOPSY Right   . BREAST SURGERY     right breast  . CESAREAN SECTION     Family History  Problem Relation Age of Onset  . Cancer Mother   . Hypertension Sister   . Diabetes Sister   . Diabetes Maternal Grandmother    Social History:  reports that she has never smoked. She has never used smokeless tobacco. She reports that she does not drink alcohol and does not use drugs.  Allergies:  Allergies  Allergen Reactions  . Aspirin Nausea And Vomiting  . Augmentin [Amoxicillin-Pot Clavulanate] Swelling and Other (See Comments)    Hand swelled at IV site   Medications: I have reviewed the patient's current medications.  Results for orders placed or performed during the hospital encounter of 07/22/20 (from the past 48 hour(s))  Type and screen     Status: None   Collection Time: 07/23/20  2:12 AM  Result Value Ref Range   ABO/RH(D) B POS    Antibody Screen NEG    Sample Expiration      07/26/2020,2359 Performed at [facility], [place] [address] [place], [place] [address]   CBC with Differential     Status: Abnormal   Collection Time: 07/23/20  2:13 AM  Result Value Ref Range  WBC 9.9 4.0 - 10.5 K/uL   RBC 2.78 (L) 3.87 - 5.11 MIL/uL   Hemoglobin 8.6 (L) 12.0 - 15.0 g/dL   HCT 25.4 (L) 36.0 - 46.0 %   MCV 91.4 80.0 - 100.0 fL   MCH 30.9 26.0 - 34.0 pg   MCHC 33.9 30.0 - 36.0 g/dL   RDW 16.3 (H) 11.5 - 15.5 %   Platelets 231 150 - 400 K/uL   nRBC 2.1 (H) 0.0 - 0.2 %   Neutrophils Relative % 95 %   Neutro Abs 9.4 (H) 1.7 - 7.7 K/uL   Lymphocytes Relative 4 %   Lymphs Abs 0.4 (L) 0.7 - 4.0 K/uL   Monocytes Relative 1 %   Monocytes Absolute 0.1 0.1 - 1.0 K/uL   Eosinophils Relative 0 %   Eosinophils Absolute 0.0 0.0 - 0.5 K/uL    Basophils Relative 0 %   Basophils Absolute 0.0 0.0 - 0.1 K/uL   WBC Morphology See Note     Comment: Mild Left Shift. 1 to 5% Metas and Myelos, Occ Pro Noted.   nRBC 3 (H) 0 /100 WBC   Abs Immature Granulocytes 0.00 0.00 - 0.07 K/uL   Acanthocytes PRESENT    Tear Drop Cells PRESENT    Polychromasia PRESENT     Comment: Performed at [facility], [place] [address] [place], [place] [address]  Comprehensive metabolic panel     Status: Abnormal   Collection Time: 07/23/20  2:13 AM  Result Value Ref Range   Sodium 124 (L) 135 - 145 mmol/L    Comment: 3   Potassium 4.9 3.5 - 5.1 mmol/L   Chloride 90 (L) 98 - 111 mmol/L   CO2 21 (L) 22 - 32 mmol/L   Glucose, Bld 563 (HH) 70 - 99 mg/dL    Comment: Glucose reference range applies only to samples taken after fasting for at least 8 hours. CRITICAL RESULT CALLED TO, READ BACK BY AND VERIFIED WITH:  J. NEWTON RN @0322  07/23/2020 K. SANDERS    BUN 19 6 - 20 mg/dL   Creatinine, Ser 0.99 0.44 - 1.00 mg/dL   Calcium 8.7 (L) 8.9 - 10.3 mg/dL   Total Protein 5.1 (L) 6.5 - 8.1 g/dL   Albumin 2.5 (L) 3.5 - 5.0 g/dL   AST 35 15 - 41 U/L   ALT 50 (H) 0 - 44 U/L   Alkaline Phosphatase 122 38 - 126 U/L   Total Bilirubin 0.9 0.3 - 1.2 mg/dL   GFR, Estimated >60 >60 mL/min    Comment: (NOTE) Calculated using the CKD-EPI Creatinine Equation (2021)    Anion gap 13 5 - 15    Comment: Performed at [facility], [place] [address] [place], [place] [address]  Protime-INR     Status: None   Collection Time: 07/23/20  3:47 AM  Result Value Ref Range   Prothrombin Time 12.9 11.4 - 15.2 seconds   INR 1.0 0.8 - 1.2    Comment: (NOTE) INR goal varies based on device and disease states. Performed at [facility], [place] [address] [place], [place] [address]   I-Stat beta hCG blood, ED     Status: None   Collection Time: 07/23/20  4:14 AM  Result Value Ref Range   I-stat hCG, quantitative <5.0 <5 mIU/mL   Comment 3            Comment:   GEST.  AGE      CONC.  (mIU/mL)   <=  1 WEEK        5 - 50     2 WEEKS       50 - 500     3 WEEKS       100 - 10,000     4 WEEKS     1,000 - 30,000        FEMALE AND NON-PREGNANT FEMALE:     LESS THAN 5 mIU/mL   Hemoglobin A1c     Status: Abnormal   Collection Time: 07/23/20  4:18 AM  Result Value Ref Range   Hgb A1c MFr Bld 11.8 (H) 4.8 - 5.6 %    Comment: (NOTE) Pre diabetes:          5.7%-6.4%  Diabetes:              >6.4%  Glycemic control for   <7.0% adults with diabetes    Mean Plasma Glucose 291.96 mg/dL    Comment: Performed at [facility] [address] [place], [place] [address]  ABO/Rh     Status: None   Collection Time: 07/23/20  4:20 AM  Result Value Ref Range   ABO/RH(D)      B POS Performed at [facility] [address] [place], [place] [address]   Hemoglobin     Status: Abnormal   Collection Time: 07/23/20  4:20 AM  Result Value Ref Range   Hemoglobin 9.2 (L) 12.0 - 15.0 g/dL    Comment: Performed at [facility], [place] [address], [place], [place] [address]  Hematocrit     Status: Abnormal   Collection Time: 07/23/20  4:20 AM  Result Value Ref Range   HCT 27.6 (L) 36.0 - 46.0 %    Comment: Performed at [facility], [place] [address], [place], [place] [address]  Lipase, blood     Status: Abnormal   Collection Time: 07/23/20  4:32 AM  Result Value Ref Range   Lipase 64 (H) 11 - 51 U/L    Comment: Performed at [facility], [place] [address], [place], [place] [address]  Basic metabolic panel     Status: Abnormal   Collection Time: 07/23/20  4:32 AM  Result Value Ref Range   Sodium 125 (L) 135 - 145 mmol/L   Potassium 4.7 3.5 - 5.1 mmol/L   Chloride 92 (L) 98 - 111 mmol/L   CO2 20 (L) 22 - 32 mmol/L   Glucose, Bld 492 (H) 70 - 99 mg/dL    Comment: Glucose reference range applies only to samples taken after fasting for at least 8 hours.   BUN 19 6 - 20 mg/dL   Creatinine, Ser 0.89 0.44 - 1.00 mg/dL   Calcium 8.4 (L) 8.9 - 10.3  mg/dL   GFR, Estimated >60 >60 mL/min    Comment: (NOTE) Calculated using the CKD-EPI Creatinine Equation (2021)    Anion gap 13 5 - 15    Comment: Performed at [facility] [address], [place], [place] [address]  CBG monitoring, ED     Status: Abnormal   Collection Time: 07/23/20  5:25 AM  Result Value Ref Range   Glucose-Capillary 416 (H) 70 - 99 mg/dL    Comment: Glucose reference range applies only to samples taken after fasting for at least 8 hours.  CBG monitoring, ED     Status: Abnormal   Collection Time: 07/23/20  6:10 AM  Result Value Ref Range   Glucose-Capillary  389 (H) 70 - 99 mg/dL    Comment: Glucose reference range applies only to samples taken after fasting for at least 8 hours.  CBG monitoring, ED     Status: Abnormal   Collection Time: 07/23/20  6:57 AM  Result Value Ref Range   Glucose-Capillary 340 (H) 70 - 99 mg/dL    Comment: Glucose reference range applies only to samples taken after fasting for at least 8 hours.  Urinalysis, Routine w reflex microscopic Urine, Random     Status: Abnormal   Collection Time: 07/23/20  7:31 AM  Result Value Ref Range   Color, Urine STRAW (A) YELLOW   APPearance CLEAR CLEAR   Specific Gravity, Urine 1.025 1.005 - 1.030   pH 5.0 5.0 - 8.0   Glucose, UA >=500 (A) NEGATIVE mg/dL   Hgb urine dipstick SMALL (A) NEGATIVE   Bilirubin Urine NEGATIVE NEGATIVE   Ketones, ur 20 (A) NEGATIVE mg/dL   Protein, ur NEGATIVE NEGATIVE mg/dL   Nitrite NEGATIVE NEGATIVE   Leukocytes,Ua LARGE (A) NEGATIVE   RBC / HPF 0-5 0 - 5 RBC/hpf   WBC, UA 21-50 0 - 5 WBC/hpf   Bacteria, UA RARE (A) NONE SEEN   Squamous Epithelial / LPF 0-5 0 - 5   Mucus PRESENT    Budding Yeast PRESENT     Comment: Performed at [facility], [address], [place], [place] [address]  CBG monitoring, ED     Status: Abnormal   Collection Time: 07/23/20  9:42 AM  Result Value Ref Range   Glucose-Capillary 139 (H) 70 - 99 mg/dL    Comment: Glucose  reference range applies only to samples taken after fasting for at least 8 hours.  Hemoglobin     Status: Abnormal   Collection Time: 07/23/20 10:44 AM  Result Value Ref Range   Hemoglobin 7.5 (L) 12.0 - 15.0 g/dL    Comment: Performed at [facility] [address], [place], [place] [address]  Hematocrit     Status: Abnormal   Collection Time: 07/23/20 10:44 AM  Result Value Ref Range   HCT 22.2 (L) 36.0 - 46.0 %    Comment: Performed at [facility], [place] [address] [place], [place] [address]  Beta-hydroxybutyric acid     Status: Abnormal   Collection Time: 07/23/20 10:44 AM  Result Value Ref Range   Beta-Hydroxybutyric Acid 0.37 (H) 0.05 - 0.27 mmol/L    Comment: Performed at [facility] [address] [place], [place] [address]  Basic metabolic panel     Status: Abnormal   Collection Time: 07/23/20 12:32 PM  Result Value Ref Range   Sodium 133 (L) 135 - 145 mmol/L   Potassium 4.7 3.5 - 5.1 mmol/L   Chloride 98 98 - 111 mmol/L   CO2 21 (L) 22 - 32 mmol/L   Glucose, Bld 253 (H) 70 - 99 mg/dL    Comment: Glucose reference range applies only to samples taken after fasting for at least 8 hours.   BUN 18 6 - 20 mg/dL   Creatinine, Ser 0.72 0.44 - 1.00 mg/dL   Calcium 8.2 (L) 8.9 - 10.3 mg/dL   GFR, Estimated >60 >60 mL/min    Comment: (NOTE) Calculated using the CKD-EPI Creatinine Equation (2021)    Anion gap 14 5 - 15    Comment: Performed at [facility] [address], [place], [place] [address]   CT ABDOMEN PELVIS W CONTRAST  Result Date: 07/23/2020 CLINICAL DATA:  Bloody stools, abdominal pain EXAM: CT ABDOMEN AND PELVIS WITH CONTRAST TECHNIQUE: Multidetector CT imaging of the abdomen and pelvis was performed using the standard protocol following bolus administration of intravenous contrast. CONTRAST:  168mL OMNIPAQUE IOHEXOL 300 MG/ML  SOLN COMPARISON:  CT 11/19/2019 FINDINGS: Lower chest: Lung bases are clear. Normal heart size. No pericardial  effusion. Hepatobiliary: Diffuse hepatic hypoattenuation compatible with hepatic steatosis. Hypoattenuating focus along the posterior aspect segment 4 near the falciform ligament and gallbladder fossa measuring approximately 2 cm in size (3/18) possible cyst versus hemangioma, incompletely characterized on this exam, less readily apparent on comparison given some motion artifact in this vicinity. No concerning focal liver lesion is seen. Gallbladder is largely decompressed with otherwise normal appearance of the gallbladder and biliary tree. Pancreas: Question some faint hazy stranding about the pancreatic head and neck (6/34. Uniform enhancement of the pancreatic parenchyma. No biliary ductal dilatation. Spleen: Normal in size. No concerning splenic lesions. Adrenals/Urinary Tract: Normal adrenal glands. No concerning adrenal lesions. Kidneys are normally located with symmetric enhancement and excretion. Subcentimeter hypertension focus in the interpolar left kidney, too small to fully characterize on CT imaging but statistically likely benign. No suspicious renal lesion, urolithiasis or hydronephrosis. Urinary bladder is unremarkable for the degree of distention. Stomach/Bowel: Distal esophagus, stomach and duodenum are free of acute abnormality. No small bowel thickening or dilatation. A normal appendix is visualized. No colonic dilatation or wall thickening. Scattered pancolonic diverticula. Some focal stranding and mild mural thickening is noted in the proximal sigmoid colon centered upon a possible culprit diverticulum (7/44). Vascular/Lymphatic: Atherosclerotic calcifications within the abdominal aorta and branch vessels. No aneurysm or ectasia. No enlarged abdominopelvic lymph nodes. Reproductive: Uterus is surgically absent. No concerning adnexal lesions. Quiescent appearance of the retained ovarian parenchyma. Other: Hazy stranding adjacent the pancreatic head and proximal body. Additional mild stranding  and thickening adjacent the proximal sigmoid colon. No free air, fluid, abscess or collection is seen in the abdomen or pelvis. No bowel containing hernias. Mild body wall edema. Musculoskeletal: Lobular fat attenuation lesion within the gluteus medius on the left measuring approximately 5.1 x 3.9 cm, previously 5.1 x 2.5 cm. Favor a slow growing lipoma in the absence of more aggressive features. The osseous structures appear diffusely demineralized which may limit detection of small or nondisplaced fractures. No acute osseous abnormality or suspicious osseous lesion. Grade 1 anterolisthesis L4 on L5. No associated spondylolysis. Multilevel discogenic and facet degenerative changes present throughout the lumbar levels some moderate to severe canal stenosis at the L4-5, L5-S1 levels as well as moderate to severe foraminal impingement. IMPRESSION: 1. Pancolonic diverticulosis with slightly more focal stranding and thickening adjacent a culprit diverticulum in the proximal sigmoid. No extraluminal gas, abscess or collection. Findings consistent with an acute uncomplicated diverticulitis. 2. Additional faint mild stranding adjacent the pancreatic head and neck. Correlate with lipase to assess for pancreatitis. 3. Hepatic steatosis. 4. Hypoattenuating focus along the posterior aspect segment 4 near the falciform ligament and gallbladder fossa measuring approximately 2 cm in size, incompletely characterized on this exam, possible cyst versus hemangioma, less readily apparent on comparison given some motion artifact in this vicinity. Could consider assessment with right upper quadrant ultrasound on a nonemergent, outpatient basis. 5. Lobular fat attenuation lesion within the left gluteus medius on the left measuring approximately 5.1 x 3.9 cm, previously 5.1 x 2.5 cm. Favor a slow growing lipoma in the absence of more aggressive features. Recommend close clinical follow-up with repeat  imaging as warranted or if new  symptoms develop. 6. Multilevel discogenic and facet degenerative changes throughout the lumbar levels with some moderate to severe canal stenosis and foraminal impingement at the L4-5, L5-S1 levels. Grade 1 anterolisthesis L4 on L5 as well. 7. Aortic Atherosclerosis (ICD10-I70.0). Electronically Signed   By: Lovena Le M.D.   On: 07/23/2020 03:57   Review of Systems  Constitutional: Positive for activity change and fatigue. Negative for appetite change and unexpected weight change.  HENT: Negative.   Eyes: Negative.   Respiratory: Negative.   Cardiovascular: Negative.   Gastrointestinal: Positive for anal bleeding, blood in stool, constipation, nausea and vomiting. Negative for abdominal distention, abdominal pain, diarrhea and rectal pain.  Endocrine: Negative.   Genitourinary: Negative.   Musculoskeletal: Positive for arthralgias, gait problem and myalgias.  Allergic/Immunologic: Negative.   Neurological: Positive for weakness.  Hematological: Negative.   Psychiatric/Behavioral: Positive for sleep disturbance.   Blood pressure 108/82, pulse (!) 115, temperature 98.1 F (36.7 C), temperature source Oral, resp. rate (!) 27, height 5\' 5"  (1.651 m), weight 82 kg, SpO2 99 %. Physical Exam Constitutional:      General: She is not in acute distress.    Appearance: She is obese. She is ill-appearing.  HENT:     Head: Normocephalic and atraumatic.     Mouth/Throat:     Mouth: Mucous membranes are dry.  Eyes:     Extraocular Movements: Extraocular movements intact.  Cardiovascular:     Rate and Rhythm: Normal rate and regular rhythm.  Pulmonary:     Effort: Pulmonary effort is normal.     Breath sounds: Normal breath sounds.  Abdominal:     General: Bowel sounds are normal. There is no distension.     Palpations: There is no mass.     Tenderness: There is no abdominal tenderness. There is no guarding.     Hernia: No hernia is present.  Musculoskeletal:     Cervical back: Neck  supple.  Skin:    General: Skin is warm and dry.  Neurological:     Mental Status: She is oriented to person, place, and time.    Assessment/Plan: 1) Rectal bleeding with a 5 gm drop in her hemoglobin-we will plan to do an EGD tomorrow to rule out peptic ulcer disease versus a bleeding AVM.  If the EGD is unrevealing a colonoscopy will be planned. The patient feels very weak and I do not feel she will be able to tolerate a colonoscopy prep tonight. 2) Chronic constipation on MiraLAX. 3) GERD on Protonix. 4) ?Sigmoid diverticulosis on CT scan done on admission-on Zosyn. 5) Severe hepatic steatosis/obesity. 6) AODM with glucose levels of 563 on admission; on sliding scale coverage with insulin. 7) Neurosarcoidosis ?seizure disorder. 8) Hypertension/OSA. 9) Degenerative disc disease/arthritis   Juanita Craver 07/23/2020, 3:05 PM

## 2020-07-23 NOTE — Progress Notes (Signed)
RN received pt from ED with large bloody BM. Notified MD, see new orders. RN will continue to monitor.

## 2020-07-23 NOTE — Progress Notes (Signed)
Thsi aptient is a 60 yr old woman who presented to [facility] on 07/22/2020 with complaints of acute lower GI bleeding and pain. She was found to have acute hemorrhagic diverticulitis with acute blood loss anemia. She was also found to have hyperosmolar hyperglycemia. She has been given pain control, IV antibiotics and IV fluids. She has also been found to have a UTI with hematuria.  She was admitted by my colleague early this morning.  The patient's vitals are stable. Follow up labs are improved except that her hemoglobin continues to drift down.  The patient is resting on the gurney while awaiting a bed upstairs. She continues to have pain, although she states that it is better.  Heart and lung exam is within normal limits. Abdomen is diffusely tender with increased tenderness in the left lower quadrant. Extremities have no cyanosis, clubbing, or edema.   Urine culture and blood cultures are pending.

## 2020-07-23 NOTE — H&P (Signed)
History and Physical    Rhonda [name] YIF:[id number] DOB: [birth year] DOA: 07/22/2020  PCP: Bartholome Bill, MD   Patient coming from: Home   Chief Complaint: Bloody stool, abdominal pain   HPI: Rhonda [name] is a 60 y.o. female with medical history significant for neurosarcoidosis, OSA, prediabetes, and GERD, now presenting to the emergency department with rectal bleeding.  Patient is accompanied by her husband who assists with the history.  Patient had been in her usual state of health when she was noted to have a small amount of rectal bleeding 2 days ago.  She then had a large volume of blood mixed with stool on 07/22/2020.  She had not been complaining of any pain but when specifically asked, she reported pain in the lower abdomen.  She frequently has nausea and occasional nonbloody vomiting per report of her husband, vomited once 2 or 3 days ago, but no blood was noted in that.  Patient denies any fevers, chills, chest pain, or cough.  She has never had a colonoscopy.  ED Course: Upon arrival to the ED, patient is found to be afebrile, saturating well on room air, tachycardic to 120s, and with stable blood pressure.  Chemistry panel is notable for glucose 563, bicarbonate 21, normal anion gap, albumin 2.5, and ALT of 50.  CBC features a normocytic anemia with hemoglobin 8.6, down from 13.9 in January.  Type and screen was performed in the emergency department and the patient was started on IV fluids, insulin infusion, given fentanyl, had antibiotics ordered, and ED physician sent a message to GI requesting routine consultation.  Review of Systems:  All other systems reviewed and apart from HPI, are negative.  Past Medical History:  Diagnosis Date  . Arthritis   . Degenerative disc disease, lumbar   . GERD (gastroesophageal reflux disease)   . Hypertension   . OSA (obstructive sleep apnea)   . Prediabetes   . Sarcoidosis of central nervous system   . Seizures ([place])   . Urinary  incontinence     Past Surgical History:  Procedure Laterality Date  . ABDOMINAL HYSTERECTOMY    . BREAST EXCISIONAL BIOPSY Right   . BREAST SURGERY     right breast  . CESAREAN SECTION      Social History:   reports that she has never smoked. She has never used smokeless tobacco. She reports that she does not drink alcohol and does not use drugs.  Allergies  Allergen Reactions  . Aspirin Nausea And Vomiting  . Augmentin [Amoxicillin-Pot Clavulanate] Swelling and Other (See Comments)    Hand swelled at IV site    Family History  Problem Relation Age of Onset  . Cancer Mother   . Hypertension Sister   . Diabetes Sister   . Diabetes Maternal Grandmother      Prior to Admission medications   Medication Sig Start Date End Date Taking? Authorizing Provider  acetaminophen (TYLENOL) 650 MG CR tablet Take 1,300 mg by mouth 2 (two) times daily as needed (arthritis pain).    Yes [provider]  aspirin 81 MG EC tablet Take 81 mg by mouth daily. 03/01/20  Yes [provider]  b complex vitamins tablet Take 1 tablet by mouth daily.   Yes [provider]  Calcium Carb-Cholecalciferol (CALCIUM+D3 PO) Take 1 tablet by mouth daily with lunch.   Yes [provider]  glucosamine-chondroitin 500-400 MG tablet Take 1 tablet by mouth 3 (three) times daily. Patient taking differently: Take  2 tablets by mouth daily with lunch. 04/11/15  Yes Funches, Josalyn, MD  melatonin 3 MG TABS tablet Take 1 tablet (3 mg total) by mouth at bedtime. 01/17/20  Yes Regalado, Belkys A, MD  metoprolol tartrate (LOPRESSOR) 25 MG tablet Take 1 tablet (25 mg total) by mouth 2 (two) times daily. 01/17/20  Yes Regalado, Belkys A, MD  Multiple Vitamin (MULTIVITAMIN WITH MINERALS) TABS tablet Take 1 tablet by mouth daily with lunch.    Yes [provider]  Omega-3 Fatty Acids (FISH OIL) 1000 MG CAPS Take 1,000 mg by mouth daily with lunch.    Yes [provider]   pantoprazole (PROTONIX) 40 MG tablet Take 1 tablet (40 mg total) by mouth daily. 01/17/20  Yes Regalado, Belkys A, MD  polyethylene glycol (MIRALAX / GLYCOLAX) 17 g packet Take 17 g by mouth 2 (two) times daily. Patient taking differently: Take 17 g by mouth daily as needed for mild constipation. 01/17/20  Yes Regalado, Belkys A, MD  predniSONE (DELTASONE) 20 MG tablet Take 40 mg by mouth in the morning and at bedtime. 07/17/20  Yes [provider]  PRESCRIPTION MEDICATION Inhale into the lungs See admin instructions. CPAP- At bedtime   Yes [provider]  atorvastatin (LIPITOR) 80 MG tablet Take 1 tablet (80 mg total) by mouth daily at 6 PM. Patient taking differently: Take 80 mg by mouth at bedtime. 05/24/19 01/11/20  Dessa Phi, DO    Physical Exam: Vitals:   07/23/20 0029 07/23/20 0100 07/23/20 0130 07/23/20 0215  BP: 113/82 133/90 (!) 129/95 (!) 128/97  Pulse: (!) 130 (!) 119 (!) 114 (!) 116  Resp: 18 (!) 25 (!) 23 (!) 27  Temp: 97.9 F (36.6 C)     SpO2: 100% 97% 98% 97%  Weight:      Height:        Constitutional: NAD, calm  Eyes: PERTLA, lids and conjunctivae normal ENMT: Mucous membranes are moist. Posterior pharynx clear of any exudate or lesions.   Neck: normal, supple, no masses, no thyromegaly Respiratory: no wheezing, no crackles. No accessory muscle use.  Cardiovascular: Rate ~100 and regular. No extremity edema.  Abdomen: No distension, soft, no rebound pain or guarding. Bowel sounds active.  Musculoskeletal: no clubbing / cyanosis. No joint deformity upper and lower extremities.   Skin: no significant rashes, lesions, ulcers. Warm, dry, well-perfused. Neurologic: No gross facial asymmetry. Sensation intact. Moving all extremities.  Psychiatric: Alert and oriented to person, place, and situation. Pleasant and cooperative.    Labs and Imaging on Admission: I have personally reviewed following labs and imaging studies  CBC: Recent Labs  Lab  07/23/20 0213  WBC 9.9  NEUTROABS 9.4*  HGB 8.6*  HCT 25.4*  MCV 91.4  PLT 371   Basic Metabolic Panel: Recent Labs  Lab 07/23/20 0213  NA 124*  K 4.9  CL 90*  CO2 21*  GLUCOSE 563*  BUN 19  CREATININE 0.99  CALCIUM 8.7*   GFR: Estimated Creatinine Clearance: 64.7 mL/min (by C-G formula based on SCr of 0.99 mg/dL). Liver Function Tests: Recent Labs  Lab 07/23/20 0213  AST 35  ALT 50*  ALKPHOS 122  BILITOT 0.9  PROT 5.1*  ALBUMIN 2.5*   No results for input(s): LIPASE, AMYLASE in the last 168 hours. No results for input(s): AMMONIA in the last 168 hours. Coagulation Profile: No results for input(s): INR, PROTIME in the last 168 hours. Cardiac Enzymes: No results for input(s): CKTOTAL, CKMB, CKMBINDEX, TROPONINI in  the last 168 hours. BNP (last 3 results) No results for input(s): PROBNP in the last 8760 hours. HbA1C: No results for input(s): HGBA1C in the last 72 hours. CBG: No results for input(s): GLUCAP in the last 168 hours. Lipid Profile: No results for input(s): CHOL, HDL, LDLCALC, TRIG, CHOLHDL, LDLDIRECT in the last 72 hours. Thyroid Function Tests: No results for input(s): TSH, T4TOTAL, FREET4, T3FREE, THYROIDAB in the last 72 hours. Anemia Panel: No results for input(s): VITAMINB12, FOLATE, FERRITIN, TIBC, IRON, RETICCTPCT in the last 72 hours. Urine analysis:    Component Value Date/Time   COLORURINE YELLOW 06/18/2020 0147   APPEARANCEUR HAZY (A) 06/18/2020 0147   APPEARANCEUR Clear 02/10/2018 1639   LABSPEC 1.006 06/18/2020 0147   PHURINE 8.0 06/18/2020 0147   GLUCOSEU >=500 (A) 06/18/2020 0147   HGBUR MODERATE (A) 06/18/2020 0147   BILIRUBINUR NEGATIVE 06/18/2020 0147   BILIRUBINUR Negative 02/10/2018 1639   KETONESUR NEGATIVE 06/18/2020 0147   PROTEINUR NEGATIVE 06/18/2020 0147   NITRITE NEGATIVE 06/18/2020 0147   LEUKOCYTESUR TRACE (A) 06/18/2020 0147   Sepsis Labs: @LABRCNTIP (procalcitonin:4,lacticidven:4) )No results found for  this or any previous visit (from the past 240 hour(s)).   Radiological Exams on Admission: CT ABDOMEN PELVIS W CONTRAST  Result Date: 07/23/2020 CLINICAL DATA:  Bloody stools, abdominal pain EXAM: CT ABDOMEN AND PELVIS WITH CONTRAST TECHNIQUE: Multidetector CT imaging of the abdomen and pelvis was performed using the standard protocol following bolus administration of intravenous contrast. CONTRAST:  194mL OMNIPAQUE IOHEXOL 300 MG/ML  SOLN COMPARISON:  CT 11/19/2019 FINDINGS: Lower chest: Lung bases are clear. Normal heart size. No pericardial effusion. Hepatobiliary: Diffuse hepatic hypoattenuation compatible with hepatic steatosis. Hypoattenuating focus along the posterior aspect segment 4 near the falciform ligament and gallbladder fossa measuring approximately 2 cm in size (3/18) possible cyst versus hemangioma, incompletely characterized on this exam, less readily apparent on comparison given some motion artifact in this vicinity. No concerning focal liver lesion is seen. Gallbladder is largely decompressed with otherwise normal appearance of the gallbladder and biliary tree. Pancreas: Question some faint hazy stranding about the pancreatic head and neck (6/34. Uniform enhancement of the pancreatic parenchyma. No biliary ductal dilatation. Spleen: Normal in size. No concerning splenic lesions. Adrenals/Urinary Tract: Normal adrenal glands. No concerning adrenal lesions. Kidneys are normally located with symmetric enhancement and excretion. Subcentimeter hypertension focus in the interpolar left kidney, too small to fully characterize on CT imaging but statistically likely benign. No suspicious renal lesion, urolithiasis or hydronephrosis. Urinary bladder is unremarkable for the degree of distention. Stomach/Bowel: Distal esophagus, stomach and duodenum are free of acute abnormality. No small bowel thickening or dilatation. A normal appendix is visualized. No colonic dilatation or wall thickening. Scattered  pancolonic diverticula. Some focal stranding and mild mural thickening is noted in the proximal sigmoid colon centered upon a possible culprit diverticulum (7/44). Vascular/Lymphatic: Atherosclerotic calcifications within the abdominal aorta and branch vessels. No aneurysm or ectasia. No enlarged abdominopelvic lymph nodes. Reproductive: Uterus is surgically absent. No concerning adnexal lesions. Quiescent appearance of the retained ovarian parenchyma. Other: Hazy stranding adjacent the pancreatic head and proximal body. Additional mild stranding and thickening adjacent the proximal sigmoid colon. No free air, fluid, abscess or collection is seen in the abdomen or pelvis. No bowel containing hernias. Mild body wall edema. Musculoskeletal: Lobular fat attenuation lesion within the gluteus medius on the left measuring approximately 5.1 x 3.9 cm, previously 5.1 x 2.5 cm. Favor a slow growing lipoma in the absence of more aggressive features. The  osseous structures appear diffusely demineralized which may limit detection of small or nondisplaced fractures. No acute osseous abnormality or suspicious osseous lesion. Grade 1 anterolisthesis L4 on L5. No associated spondylolysis. Multilevel discogenic and facet degenerative changes present throughout the lumbar levels some moderate to severe canal stenosis at the L4-5, L5-S1 levels as well as moderate to severe foraminal impingement. IMPRESSION: 1. Pancolonic diverticulosis with slightly more focal stranding and thickening adjacent a culprit diverticulum in the proximal sigmoid. No extraluminal gas, abscess or collection. Findings consistent with an acute uncomplicated diverticulitis. 2. Additional faint mild stranding adjacent the pancreatic head and neck. Correlate with lipase to assess for pancreatitis. 3. Hepatic steatosis. 4. Hypoattenuating focus along the posterior aspect segment 4 near the falciform ligament and gallbladder fossa measuring approximately 2 cm in  size, incompletely characterized on this exam, possible cyst versus hemangioma, less readily apparent on comparison given some motion artifact in this vicinity. Could consider assessment with right upper quadrant ultrasound on a nonemergent, outpatient basis. 5. Lobular fat attenuation lesion within the left gluteus medius on the left measuring approximately 5.1 x 3.9 cm, previously 5.1 x 2.5 cm. Favor a slow growing lipoma in the absence of more aggressive features. Recommend close clinical follow-up with repeat imaging as warranted or if new symptoms develop. 6. Multilevel discogenic and facet degenerative changes throughout the lumbar levels with some moderate to severe canal stenosis and foraminal impingement at the L4-5, L5-S1 levels. Grade 1 anterolisthesis L4 on L5 as well. 7. Aortic Atherosclerosis (ICD10-I70.0). Electronically Signed   By: Lovena Le M.D.   On: 07/23/2020 03:57    Assessment/Plan  1. Acute lower GI bleeding; acute sigmoid diverticulitis; acute blood-loss anemia   - Presents with lower abdominal pain and hematochezia and is found to have Hgb 8.6 (13.9 in January 2022) and CT findings concerning for sigmoid diverticulitis without abscess or perforation  - Type and screen was performed in ED and antibiotics were ordered  - Trend H&H, transfuse if needed, treat possible diverticulitis with Zosyn    2. Hyperosmolar hyperglycemic state  - Patient with hx of prediabetes on chronic steroids for neurosarcoidosis presents with abdominal pain and GI bleeding and is found to have serum glucose 563 with serum bicarbonate of 21 and normal AG  - She was started on IVF and insulin infusion in ED  - Continue IVF, continue insulin infusion with frequent CBGs, update A1c, repeat chem panel    3. Neurosarcoidosis  - Has been managed with steroids for many years, currently taking prednisone 40 mg BID  - She is scheduled to establish with a new neurologist on 08/01/20 - Continue systemic  steroids    4. OSA   - Continue CPAP qHS    5. Incidental CT findings  - Suspected cyst or hemangioma noted near falciform ligament with outpatient [place] recommended  - Intramuscular growth involving left gluteus medius noted, likely lipoma, close follow-up advised    DVT prophylaxis: SCDs  Code Status: Full  Level of Care: Level of care: Progressive Family Communication: Husband updated at bedside Disposition Plan:  Patient is from: Home  Anticipated d/c is to: Home  Anticipated d/c date is: 07/26/20 Patient currently: Pending glycemic-control, repeat H&H  Consults called: ED sent message to GI with request for routine consultation   Admission status: Inpatient     Vianne Bulls, MD [facility]  07/23/2020, 4:33 AM

## 2020-07-23 NOTE — ED Notes (Signed)
Attempted to give report to floorx1

## 2020-07-24 ENCOUNTER — Inpatient Hospital Stay (HOSPITAL_COMMUNITY): Payer: 59 | Admitting: Anesthesiology

## 2020-07-24 ENCOUNTER — Encounter (HOSPITAL_COMMUNITY)
Admission: EM | Disposition: A | Discharge status: Home / Self Care | Payer: Self-pay | Source: Home / Self Care | Attending: Internal Medicine

## 2020-07-24 ENCOUNTER — Encounter (HOSPITAL_COMMUNITY): Payer: Self-pay | Admitting: Family Medicine

## 2020-07-24 HISTORY — PX: ESOPHAGOGASTRODUODENOSCOPY (EGD) WITH PROPOFOL: SHX5813

## 2020-07-24 LAB — CBC WITH DIFFERENTIAL/PLATELET
Abs Immature Granulocytes: 1.22 10*3/uL — ABNORMAL HIGH (ref 0.00–0.07)
Basophils Absolute: 0.1 10*3/uL (ref 0.0–0.1)
Basophils Relative: 1 %
Eosinophils Absolute: 0 10*3/uL (ref 0.0–0.5)
Eosinophils Relative: 0 %
HCT: 23.7 % — ABNORMAL LOW (ref 36.0–46.0)
Hemoglobin: 8.1 g/dL — ABNORMAL LOW (ref 12.0–15.0)
Immature Granulocytes: 11 %
Lymphocytes Relative: 17 %
Lymphs Abs: 1.9 10*3/uL (ref 0.7–4.0)
MCH: 30.9 pg (ref 26.0–34.0)
MCHC: 34.2 g/dL (ref 30.0–36.0)
MCV: 90.5 fL (ref 80.0–100.0)
Monocytes Absolute: 0.6 10*3/uL (ref 0.1–1.0)
Monocytes Relative: 6 %
Neutro Abs: 7.4 10*3/uL (ref 1.7–7.7)
Neutrophils Relative %: 65 %
Platelets: 172 10*3/uL (ref 150–400)
RBC: 2.62 MIL/uL — ABNORMAL LOW (ref 3.87–5.11)
RDW: 15.7 % — ABNORMAL HIGH (ref 11.5–15.5)
WBC: 11.2 10*3/uL — ABNORMAL HIGH (ref 4.0–10.5)
nRBC: 8.7 % — ABNORMAL HIGH (ref 0.0–0.2)

## 2020-07-24 LAB — BASIC METABOLIC PANEL
Anion gap: 8 (ref 5–15)
BUN: 16 mg/dL (ref 6–20)
CO2: 27 mmol/L (ref 22–32)
Calcium: 7.9 mg/dL — ABNORMAL LOW (ref 8.9–10.3)
Chloride: 97 mmol/L — ABNORMAL LOW (ref 98–111)
Creatinine, Ser: 0.72 mg/dL (ref 0.44–1.00)
GFR, Estimated: >60 mL/min (ref >60)
Glucose, Bld: 314 mg/dL — ABNORMAL HIGH (ref 70–99)
Potassium: 3.8 mmol/L (ref 3.5–5.1)
Sodium: 132 mmol/L — ABNORMAL LOW (ref 135–145)

## 2020-07-24 LAB — GLUCOSE, CAPILLARY
Glucose-Capillary: 300 mg/dL — ABNORMAL HIGH (ref 70–99)
Glucose-Capillary: 309 mg/dL — ABNORMAL HIGH (ref 70–99)
Glucose-Capillary: 307 mg/dL — ABNORMAL HIGH (ref 70–99)
Glucose-Capillary: 282 mg/dL — ABNORMAL HIGH (ref 70–99)
Glucose-Capillary: 249 mg/dL — ABNORMAL HIGH (ref 70–99)

## 2020-07-24 LAB — URINE CULTURE

## 2020-07-24 LAB — HEMOGLOBIN AND HEMATOCRIT, BLOOD
HCT: 23.3 % — ABNORMAL LOW (ref 36.0–46.0)
Hemoglobin: 7.8 g/dL — ABNORMAL LOW (ref 12.0–15.0)

## 2020-07-24 LAB — PATHOLOGIST SMEAR REVIEW

## 2020-07-24 LAB — GLUCOSE, RANDOM: Glucose, Bld: 318 mg/dL — ABNORMAL HIGH (ref 70–99)

## 2020-07-24 SURGERY — ESOPHAGOGASTRODUODENOSCOPY (EGD) WITH PROPOFOL
Anesthesia: Monitor Anesthesia Care

## 2020-07-24 MED ORDER — LIVING WELL WITH DIABETES BOOK
Freq: Once | Status: AC
  Filled 2020-07-24: qty 1

## 2020-07-24 MED ORDER — INSULIN ASPART 100 UNIT/ML ~~LOC~~ SOLN
SUBCUTANEOUS | Status: AC
  Administered 2020-07-24: 100 [IU]
  Filled 2020-07-24: qty 1

## 2020-07-24 MED ORDER — PHENYLEPHRINE 40 MCG/ML (10ML) SYRINGE FOR IV PUSH (FOR BLOOD PRESSURE SUPPORT)
PREFILLED_SYRINGE | INTRAVENOUS | Status: DC | PRN
  Administered 2020-07-24: 120 ug via INTRAVENOUS

## 2020-07-24 MED ORDER — SODIUM CHLORIDE 0.9 % IV SOLN: INTRAVENOUS | Status: DC | PRN

## 2020-07-24 MED ORDER — PEG 3350-KCL-NA BICARB-NACL 420 G PO SOLR: 4000.0000 mL | Freq: Once | ORAL | Status: DC

## 2020-07-24 MED ORDER — LIDOCAINE 2% (20 MG/ML) 5 ML SYRINGE
INTRAMUSCULAR | Status: DC | PRN
  Administered 2020-07-24: 100 mg via INTRAVENOUS

## 2020-07-24 MED ORDER — PEG 3350-KCL-NA BICARB-NACL 420 G PO SOLR
4000.0000 mL | Freq: Once | ORAL | Status: AC
  Administered 2020-07-24: 4000 mL via ORAL
  Filled 2020-07-24: qty 4000

## 2020-07-24 MED ORDER — SODIUM CHLORIDE 0.9 % IV SOLN: INTRAVENOUS | Status: DC

## 2020-07-24 MED ORDER — PROPOFOL 500 MG/50ML IV EMUL
INTRAVENOUS | Status: DC | PRN
  Administered 2020-07-24: 50 mg via INTRAVENOUS
  Administered 2020-07-24: 20 mg via INTRAVENOUS

## 2020-07-24 SURGICAL SUPPLY — 15 items

## 2020-07-24 NOTE — Progress Notes (Addendum)
Inpatient Diabetes Program Recommendations  AACE/ADA: New Consensus Statement on Inpatient Glycemic Control   Target Ranges:  Prepandial:   less than 140 mg/dL      Peak postprandial:   less than 180 mg/dL (1-2 hours)      Critically ill patients:  140 - 180 mg/dL  Results for MICIAH, SHEALY (MRN [id number]) as of 07/24/2020 07:28  Ref. Range 07/23/2020 05:25 07/23/2020 06:10 07/23/2020 06:57 07/23/2020 09:42 07/23/2020 18:09 07/23/2020 19:37 07/24/2020 07:11  Glucose-Capillary Latest Ref Range: 70 - 99 mg/dL 416 (H) 389 (H) 340 (H) 139 (H) 407 (H) 374 (H) 300 (H)   Results for HERMELA, HARDT (MRN [id number]) as of 07/24/2020 07:28  Ref. Range 07/23/2020 02:13  Glucose Latest Ref Range: 70 - 99 mg/dL 563 [facility])   Results for SHARECE, FLEISCHHACKER (MRN [id number]) as of 07/24/2020 07:28  Ref. Range 11/14/2019 01:09 07/23/2020 04:18  Hemoglobin A1C Latest Ref Range: 4.8 - 5.6 % 5.9 (H) 11.8 (H)  Results for KHARMA, SAMPSEL (MRN [id number]) as of 07/24/2020 07:28  Ref. Range 07/23/2020 02:13  Hemoglobin Latest Ref Range: 12.0 - 15.0 g/dL 8.6 (L)   Review of Glycemic Control  Diabetes history: PreDM Outpatient Diabetes medications: None Current orders for Inpatient glycemic control: Lantus 12 units daily, Novolog 0-15 units TID with meals; Solumedrol 40 mg Q12H  Inpatient Diabetes Program Recommendations:    Insulin: If steroids are continued, please consider increasing Lantus to 20 units daily, adding Novolog 0-5 units QHS for bedtime correction, and once diet resumed ordering Novolog 5 units TID with meals for meal coverage if patient eats at least 50% of meals.  HbgA1C: A1C 11.8% on 07/23/20 indicating an average glucose of 292 mg/dl over the past 2-3 months. However, since hemoglobin 8.6 g/dL anticipate A1C is not completely accurate.  Addendum 07/24/20@11 :35-Spoke with patient and her husband about new diabetes diagnosis. Patient states that she has been told that she had preDM in the past. Patient does not monitor glucose  and has not been following any type of diet.  Patient states that she understands that the chronic steroids she is taking is contributing to her elevated glucose.  Patient states she is aware of what an A1C is. Explained that prior A1C was 5.9% on 11/14/19 and current A1C is 11.8% 07/23/20.  Explained that since hemoglobin is low, A1C are likely not completely accurate.  Informed patient that current A1C indicates an average glucose of 292 mg/dl over the past 2-3 months. Discussed basic pathophysiology of DM Type 2, basic home care, importance of checking CBGs and maintaining good CBG control to prevent long-term and short-term complications. Reviewed glucose and A1C goals.   Discussed impact of nutrition, exercise, stress, sickness, and medications on diabetes control. Patient was ordered Living Well with diabetes booklet yesterday but patient has not received. Will order the book again. Patient reports that she can see well enough to read but her husband can read the book to her. Encouraged patient and her husband to read through entire book once received. Patient to have a procedure today. Informed patient that diabetes coordinator talk with her again in a day or two regarding DM dx. Will consult RD for diet education (please see in person as patient can not see well to read).  Patient verbalized understanding of information discussed and he states that he has no further questions at this time related to diabetes.   RNs to provide ongoing basic DM education at bedside with this patient and her family.  Thanks, Barnie Alderman, RN, MSN, CDE Diabetes Coordinator Inpatient Diabetes Program [phone] (Team Pager from 8am to 5pm)

## 2020-07-24 NOTE — Op Note (Signed)
[facility] Patient Name: Rhonda [name] Procedure Date : 07/24/2020 MRN: [id number] Attending MD: Carol Ada , MD Date of Birth: 1960/09/02 CSN: [id number] Age: 60 Admit Type: Inpatient Procedure:                Upper GI endoscopy Indications:              Iron deficiency anemia Providers:                Carol Ada, MD, Jobe Igo, RN, Elspeth Cho Tech., Technician Referring MD:              Medicines:                Propofol per Anesthesia Complications:            No immediate complications. Estimated Blood Loss:     Estimated blood loss: none. Procedure:                Pre-Anesthesia Assessment:                           - Prior to the procedure, a History and Physical                            was performed, and patient medications and                            allergies were reviewed. The patient's tolerance of                            previous anesthesia was also reviewed. The risks                            and benefits of the procedure and the sedation                            options and risks were discussed with the patient.                            All questions were answered, and informed consent                            was obtained. Prior Anticoagulants: The patient has                            taken no previous anticoagulant or antiplatelet                            agents. ASA Grade Assessment: III - A patient with                            severe systemic disease. After reviewing the risks  and benefits, the patient was deemed in                            satisfactory condition to undergo the procedure.                           - Sedation was administered by an anesthesia                            professional. Deep sedation was attained.                           After obtaining informed consent, the endoscope was                            passed under direct vision.  Throughout the                            procedure, the patient's blood pressure, pulse, and                            oxygen saturations were monitored continuously. The                            [id number]-[id number] ([id number] Olympus gastroscope was                            introduced through the mouth, and advanced to the                            second part of duodenum. The upper GI endoscopy was                            accomplished without difficulty. The patient                            tolerated the procedure well. Scope In: Scope Out: Findings:      The esophagus was normal.      The stomach was normal.      The examined duodenum was normal. Impression:               - Normal esophagus.                           - Normal stomach.                           - Normal examined duodenum.                           - No specimens collected. Recommendation:           - Colonoscopy tomorrow. Procedure Code(s):        --- Professional ---                           [phone], Esophagogastroduodenoscopy, flexible,  transoral; diagnostic, including collection of                            specimen(s) by brushing or washing, when performed                            (separate procedure) Diagnosis Code(s):        --- Professional ---                           D50.9, Iron deficiency anemia, unspecified CPT copyright 2019 [employer]. All rights reserved. The codes documented in this report are preliminary and upon coder review may  be revised to meet current compliance requirements. Carol Ada, MD Carol Ada, MD 07/24/2020 4:16:38 PM This report has been signed electronically. Number of Addenda: 0

## 2020-07-24 NOTE — Anesthesia Preprocedure Evaluation (Signed)
Anesthesia Evaluation  Patient identified by MRN, date of birth, ID band Patient awake    Reviewed: Allergy & Precautions, NPO status , Patient's Chart, lab work & pertinent test results, reviewed documented beta blocker date and time   Airway Mallampati: II  TM Distance: >3 FB Neck ROM: Full    Dental  (+) Edentulous Upper, Edentulous Lower   Pulmonary sleep apnea and Continuous Positive Airway Pressure Ventilation ,    Pulmonary exam normal        Cardiovascular hypertension, Pt. on medications and Pt. on home beta blockers  Rhythm:Regular Rate:Normal     Neuro/Psych Seizures -,  neurosarcoidosis negative psych ROS   GI/Hepatic Neg liver ROS, GERD  Medicated,Rectal bleeding   Endo/Other  diabetes, Poorly Controlled, Type 2, Insulin Dependent  Renal/GU negative Renal ROS  negative genitourinary   Musculoskeletal  (+) Arthritis ,   Abdominal (+)  Abdomen: soft. Bowel sounds: normal.  Peds  Hematology  (+) anemia ,   Anesthesia Other Findings   Reproductive/Obstetrics                             Anesthesia Physical Anesthesia Plan  ASA: III  Anesthesia Plan: MAC   Post-op Pain Management:    Induction: Intravenous  PONV Risk Score and Plan: 2 and Propofol infusion  Airway Management Planned: Simple Face Mask, Natural Airway and Nasal Cannula  Additional Equipment: None  Intra-op Plan:   Post-operative Plan:   Informed Consent: I have reviewed the patients History and Physical, chart, labs and discussed the procedure including the risks, benefits and alternatives for the proposed anesthesia with the patient or authorized representative who has indicated his/her understanding and acceptance.     Dental advisory given  Plan Discussed with: CRNA  Anesthesia Plan Comments: (Given 10Usq per SSI pre-procedure  Lab Results      Component                Value                Date                      WBC                      11.2 (H)            07/24/2020                HGB                      8.1 (L)             07/24/2020                HCT                      23.7 (L)            07/24/2020                MCV                      90.5                07/24/2020                PLT  172                 07/24/2020           Lab Results      Component                Value               Date                      NA                       132 (L)             07/24/2020                K                        3.8                 07/24/2020                CO2                      27                  07/24/2020                GLUCOSE                  314 (H)             07/24/2020                BUN                      16                  07/24/2020                CREATININE               0.72                07/24/2020                CALCIUM                  7.9 (L)             07/24/2020                GFRNONAA                 >60                 07/24/2020                GFRAA                    >60                 01/17/2020          )        Anesthesia Quick Evaluation

## 2020-07-24 NOTE — Interval H&P Note (Signed)
History and Physical Interval Note:  07/24/2020 3:47 PM  Rhonda [name]  has presented today for surgery, with the diagnosis of Rectal bleeding with acute blood loss anemia.  The various methods of treatment have been discussed with the patient and family. After consideration of risks, benefits and other options for treatment, the patient has consented to  Procedure(s) with comments: ESOPHAGOGASTRODUODENOSCOPY (EGD) WITH PROPOFOL (N/A) - Bleeding with anemia-5 gms drop in hemoglobin as a surgical intervention.  The patient's history has been reviewed, patient examined, no change in status, stable for surgery.  I have reviewed the patient's chart and labs.  Questions were answered to the patient's satisfaction.     [name] D

## 2020-07-24 NOTE — Anesthesia Procedure Notes (Signed)
Procedure Name: MAC Date/Time: 07/24/2020 3:55 PM Performed by: Renato Shin, CRNA Pre-anesthesia Checklist: Patient identified, Emergency Drugs available, Suction available and Patient being monitored Patient Re-evaluated:Patient Re-evaluated prior to induction Oxygen Delivery Method: Nasal cannula Induction Type: IV induction Placement Confirmation: positive ETCO2 and breath sounds checked- equal and bilateral Dental Injury: Teeth and Oropharynx as per pre-operative assessment

## 2020-07-24 NOTE — Transfer of Care (Signed)
Immediate Anesthesia Transfer of Care Note  Patient: Rhonda [name]  Procedure(s) Performed: ESOPHAGOGASTRODUODENOSCOPY (EGD) WITH PROPOFOL (N/A )  Patient Location: PACU  Anesthesia Type:MAC  Level of Consciousness: awake and patient cooperative  Airway & Oxygen Therapy: Patient Spontanous Breathing and Patient connected to nasal cannula oxygen  Post-op Assessment: Report given to RN and Post -op Vital signs reviewed and stable  Post vital signs: Reviewed and stable  Last Vitals:  Vitals Value Taken Time  BP    Temp    Pulse    Resp    SpO2      Last Pain:  Vitals:   07/24/20 1450  TempSrc: Oral  PainSc: 0-No pain         Complications: No complications documented.

## 2020-07-24 NOTE — Progress Notes (Signed)
PROGRESS NOTE  Rhonda [name] XKG:[id number] DOB: Mar 27, 1961 DOA: 07/22/2020 PCP: Bartholome Bill, MD  Brief History   This aptient is a 60 yr old woman who presented to [facility] on 07/22/2020 with complaints of acute lower GI bleeding and pain. She was found to have acute hemorrhagic diverticulitis with acute blood loss anemia. She was also found to have hyperosmolar hyperglycemia. She has been given pain control, IV antibiotics and IV fluids. She has also been found to have a UTI with hematuria.  The patient has continued to have bright red blood per rectum and required transfusion of one unit PRBC's last evening. Hemoglobin was 8.1 this am. Will continue to monitor.  GI has been consulted. Plan is for EGD today with Dr. Benson Norway.   Consultants  . Gastroenterology  Procedures  . Transfusion 1 unit PRBC's  Antibiotics   Anti-infectives (From admission, onward)   Start     Dose/Rate Route Frequency Ordered Stop   07/23/20 1000  piperacillin-tazobactam (ZOSYN) IVPB 3.375 g        3.375 g 12.5 mL/hr over 240 Minutes Intravenous Every 8 hours 07/23/20 0422     07/23/20 0430  piperacillin-tazobactam (ZOSYN) IVPB 3.375 g        3.375 g 100 mL/hr over 30 Minutes Intravenous  Once 07/23/20 0422 07/23/20 0548   07/23/20 0415  ciprofloxacin (CIPRO) IVPB 400 mg  Status:  Discontinued        400 mg 200 mL/hr over 60 Minutes Intravenous  Once 07/23/20 0410 07/23/20 0412   07/23/20 0415  metroNIDAZOLE (FLAGYL) IVPB 500 mg  Status:  Discontinued        500 mg 100 mL/hr over 60 Minutes Intravenous  Once 07/23/20 0410 07/23/20 0412    .   Subjective  The patient is resting comfortably. No new complaints.  Objective   Vitals:  Vitals:   07/24/20 1620 07/24/20 1630  BP: 131/86 124/85  Pulse: (!) 118 (!) 122  Resp: (!) 24 13  Temp:    SpO2: 98% 100%   Exam:  Constitutional:  . The patient is awake, alert, and oriented x 3. No acute distress. Respiratory:  . No increased work of  breathing. . No wheezes, rales, or rhonchi . No tactile fremitus Cardiovascular:  . Regular rate and rhythm . No murmurs, ectopy, or gallups. . No lateral PMI. No thrills. Abdomen:  . Abdomen is soft, non-tender, non-distended . No hernias, masses, or organomegaly . Normoactive bowel sounds.  Musculoskeletal:  . No cyanosis, clubbing, or edema Skin:  . No rashes, lesions, ulcers . palpation of skin: no induration or nodules Neurologic:  . CN 2-12 intact . Sensation all 4 extremities intact Psychiatric:  . Mental status o Mood, affect appropriate o Orientation to person, place, time  . judgment and insight appear intact   I have personally reviewed the following:   Today's Data  . Vitals, CBC, BMP  Imaging  . CT abdomen and pelvis  Scheduled Meds: . insulin aspart  0-15 Units Subcutaneous TID WC  . insulin glargine  12 Units Subcutaneous Daily  . methylPREDNISolone (SOLU-MEDROL) injection  40 mg Intravenous Q12H  . pantoprazole (PROTONIX) IV  40 mg Intravenous Q24H  . polyethylene glycol-electrolytes  4,000 mL Oral Once   Continuous Infusions: . dextrose 5% lactated ringers    . lactated ringers 125 mL/hr at 07/23/20 1759  . piperacillin-tazobactam (ZOSYN)  IV 3.375 g (07/24/20 1705)    Principal Problem:   Acute lower GI bleeding Active  Problems:   OSA (obstructive sleep apnea)   Hyperosmolar hyperglycemic state (HHS) ([name])   Diverticulitis of large intestine with bleeding   Liver lesion   Muscle tumor   LOS: 1 day   Acute lower GI bleeding; acute sigmoid diverticulitis; acute blood-loss anemia :Bloody BM's have continued overnight.  - Presents with lower abdominal pain and hematochezia and is found to have Hgb 8.6 (13.9 in January 2022) and CT findings concerning for sigmoid diverticulitis without abscess or perforation Zosyn for diverticulitis.  - Type and screen was performed in ED and antibiotics were ordered  - Pt required transfusion of 1 unit of  PRBC's due to Hgb decline to 6.2. 8.1 this morning. Continue to monitor. -Plan is for GI to perform EGD today.  Hyperosmolar hyperglycemic state: Improved - Patient with hx of prediabetes on chronic steroids for neurosarcoidosis presents with abdominal pain and GI bleeding and is found to have serum glucose 563 with serum bicarbonate of 21 and normal AG  - HbA1c is 11.8. the patient has been placed on lantus and glucoses are being followed with FSBS and SSI.  Neurosarcoidosis: Noted.  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 I have spent 34 minutes in her evaluation and care.  DVT prophylaxis: SCDs  Code Status: Full  Level of Care: Level of care: Progressive Family Communication: Husband updated at bedside Disposition Plan:  Patient is from: Home  Anticipated d/c is to: Home  Anticipated d/c date is: 07/26/20 Patient currently: Pending glycemic-control, repeat H&H, Awaiting EGD and CSPY per GI.  Karie Kirks, DO  [facility] 07/24/2020 5:24 PM

## 2020-07-24 NOTE — Anesthesia Postprocedure Evaluation (Signed)
Anesthesia Post Note  Patient: Rhonda [name]  Procedure(s) Performed: ESOPHAGOGASTRODUODENOSCOPY (EGD) WITH PROPOFOL (N/A )     Patient location during evaluation: Endoscopy Anesthesia Type: MAC Level of consciousness: awake and alert Pain management: pain level controlled Vital Signs Assessment: post-procedure vital signs reviewed and stable Respiratory status: spontaneous breathing, nonlabored ventilation, respiratory function stable and patient connected to nasal cannula oxygen Cardiovascular status: stable and blood pressure returned to baseline Postop Assessment: no apparent nausea or vomiting Anesthetic complications: no   No complications documented.  Last Vitals:  Vitals:   07/24/20 1620 07/24/20 1630  BP: 131/86 124/85  Pulse: (!) 118 (!) 122  Resp: (!) 24 13  Temp:    SpO2: 98% 100%    Last Pain:  Vitals:   07/24/20 1630  TempSrc:   PainSc: 0-No pain                 Belenda Cruise P Abbygayle Helfand

## 2020-07-25 ENCOUNTER — Encounter (HOSPITAL_COMMUNITY): Payer: Self-pay | Admitting: Certified Registered Nurse Anesthetist

## 2020-07-25 ENCOUNTER — Encounter (HOSPITAL_COMMUNITY)
Admission: EM | Disposition: A | Discharge status: Home / Self Care | Payer: Self-pay | Source: Home / Self Care | Attending: Internal Medicine

## 2020-07-25 ENCOUNTER — Encounter (HOSPITAL_COMMUNITY): Payer: Self-pay | Admitting: Family Medicine

## 2020-07-25 LAB — BASIC METABOLIC PANEL
Anion gap: 8 (ref 5–15)
BUN: 13 mg/dL (ref 6–20)
CO2: 27 mmol/L (ref 22–32)
Calcium: 7.8 mg/dL — ABNORMAL LOW (ref 8.9–10.3)
Chloride: 95 mmol/L — ABNORMAL LOW (ref 98–111)
Creatinine, Ser: 0.64 mg/dL (ref 0.44–1.00)
GFR, Estimated: >60 mL/min (ref >60)
Glucose, Bld: 212 mg/dL — ABNORMAL HIGH (ref 70–99)
Potassium: 3.4 mmol/L — ABNORMAL LOW (ref 3.5–5.1)
Sodium: 130 mmol/L — ABNORMAL LOW (ref 135–145)

## 2020-07-25 LAB — CBC
HCT: 18.2 % — ABNORMAL LOW (ref 36.0–46.0)
Hemoglobin: 6.3 g/dL — CL (ref 12.0–15.0)
MCH: 31.5 pg (ref 26.0–34.0)
MCHC: 34.6 g/dL (ref 30.0–36.0)
MCV: 91 fL (ref 80.0–100.0)
Platelets: 166 10*3/uL (ref 150–400)
RBC: 2 MIL/uL — ABNORMAL LOW (ref 3.87–5.11)
RDW: 16.3 % — ABNORMAL HIGH (ref 11.5–15.5)
WBC: 9.9 10*3/uL (ref 4.0–10.5)
nRBC: 10.7 % — ABNORMAL HIGH (ref 0.0–0.2)

## 2020-07-25 LAB — HEMOGLOBIN AND HEMATOCRIT, BLOOD
HCT: 27.3 % — ABNORMAL LOW (ref 36.0–46.0)
Hemoglobin: 9.4 g/dL — ABNORMAL LOW (ref 12.0–15.0)

## 2020-07-25 LAB — PREPARE RBC (CROSSMATCH)

## 2020-07-25 LAB — GLUCOSE, CAPILLARY
Glucose-Capillary: 204 mg/dL — ABNORMAL HIGH (ref 70–99)
Glucose-Capillary: 188 mg/dL — ABNORMAL HIGH (ref 70–99)
Glucose-Capillary: 149 mg/dL — ABNORMAL HIGH (ref 70–99)
Glucose-Capillary: 172 mg/dL — ABNORMAL HIGH (ref 70–99)

## 2020-07-25 SURGERY — CANCELLED PROCEDURE

## 2020-07-25 MED ORDER — SODIUM CHLORIDE 0.9% IV SOLUTION: Freq: Once | INTRAVENOUS | Status: AC

## 2020-07-25 MED ORDER — INSULIN ASPART 100 UNIT/ML ~~LOC~~ SOLN
0.0000 [IU] | Freq: Every day | SUBCUTANEOUS | Status: DC
  Administered 2020-07-26: 5 [IU] via SUBCUTANEOUS

## 2020-07-25 MED ORDER — LACTATED RINGERS IV SOLN: INTRAVENOUS | Status: DC

## 2020-07-25 MED ORDER — INSULIN GLARGINE 100 UNIT/ML ~~LOC~~ SOLN
18.0000 [IU] | Freq: Every day | SUBCUTANEOUS | Status: DC
  Administered 2020-07-25 – 2020-07-26 (×2): 18 [IU] via SUBCUTANEOUS
  Filled 2020-07-25 (×3): qty 0.18

## 2020-07-25 MED ORDER — POTASSIUM CHLORIDE CRYS ER 20 MEQ PO TBCR
40.0000 meq | EXTENDED_RELEASE_TABLET | Freq: Once | ORAL | Status: AC
  Administered 2020-07-25: 40 meq via ORAL
  Filled 2020-07-25: qty 2

## 2020-07-25 MED ORDER — PEG 3350-KCL-NA BICARB-NACL 420 G PO SOLR
4000.0000 mL | Freq: Once | ORAL | Status: AC
  Administered 2020-07-25: 4000 mL via ORAL
  Filled 2020-07-25: qty 4000

## 2020-07-25 SURGICAL SUPPLY — 22 items

## 2020-07-25 NOTE — Progress Notes (Signed)
The patient's HGB is too low to perform the procedure safely, per anesthesia.  Anesthesia desires the patient to have another unit of blood before performing the procedure.  The patient's case will be postponed until tomorrow.

## 2020-07-25 NOTE — Anesthesia Preprocedure Evaluation (Deleted)
Anesthesia Evaluation  Patient identified by MRN, date of birth, ID band Patient awake    Reviewed: Allergy & Precautions, NPO status , Patient's Chart, lab work & pertinent test results, reviewed documented beta blocker date and time   Airway Mallampati: II  TM Distance: >3 FB Neck ROM: Full    Dental  (+) Edentulous Upper, Edentulous Lower   Pulmonary sleep apnea and Continuous Positive Airway Pressure Ventilation ,    Pulmonary exam normal        Cardiovascular hypertension, Pt. on medications and Pt. on home beta blockers  Rhythm:Regular Rate:Normal     Neuro/Psych Seizures -,  neurosarcoidosis CVA negative psych ROS   GI/Hepatic Neg liver ROS, GERD  Medicated,Rectal bleeding   Endo/Other  diabetes, Poorly Controlled, Type 2, Insulin Dependent  Renal/GU negative Renal ROS  negative genitourinary   Musculoskeletal  (+) Arthritis ,   Abdominal (+)  Abdomen: soft. Bowel sounds: normal.  Peds  Hematology  (+) anemia ,   Anesthesia Other Findings   Reproductive/Obstetrics                             Anesthesia Physical  Anesthesia Plan  ASA: III  Anesthesia Plan: MAC   Post-op Pain Management:    Induction: Intravenous  PONV Risk Score and Plan: 2 and Propofol infusion  Airway Management Planned: Simple Face Mask, Natural Airway and Nasal Cannula  Additional Equipment: None  Intra-op Plan:   Post-operative Plan:   Informed Consent: I have reviewed the patients History and Physical, chart, labs and discussed the procedure including the risks, benefits and alternatives for the proposed anesthesia with the patient or authorized representative who has indicated his/her understanding and acceptance.     Dental advisory given  Plan Discussed with: CRNA  Anesthesia Plan Comments: (Reviewed labs upon arrival to endo. Requested hemoglobin (iStat) be sent. Vista Lawman stated  lab "in process." Lab resulted after Dr. Benson Norway and I both had seen the patient. I stated pt should receive 1 unit of PRBCs prior to start of procedure. Returned to endo unit short time later and patient was being returned to floor and, per RN (Love), Dr. Benson Norway cancelled case due to low hgb and that patient had not done bowel prep.)       Anesthesia Quick Evaluation

## 2020-07-25 NOTE — H&P (View-Only) (Signed)
The patient's HGB is too low to perform the procedure safely, per anesthesia.  Anesthesia desires the patient to have another unit of blood before performing the procedure.  The patient's case will be postponed until tomorrow.

## 2020-07-25 NOTE — Interval H&P Note (Signed)
History and Physical Interval Note:  07/25/2020 12:07 PM  Rhonda [name]  has presented today for surgery, with the diagnosis of Abnormal CT scan.  The various methods of treatment have been discussed with the patient and family. After consideration of risks, benefits and other options for treatment, the patient has consented to  Procedure(s): COLONOSCOPY WITH PROPOFOL (N/A) as a surgical intervention.  The patient's history has been reviewed, patient examined, no change in status, stable for surgery.  I have reviewed the patient's chart and labs.  Questions were answered to the patient's satisfaction.     Melvine Julin D

## 2020-07-25 NOTE — Progress Notes (Signed)
Date and time results received: 07/25/20 1210  (use smartphrase ".now" to insert current time)  Test: HGB  Critical Value: 6.3  Name of Provider Notified: Dr. Benson Norway  Orders Received? Or Actions Taken?: Orders Received - See Orders for details - pt to receive blood

## 2020-07-25 NOTE — Progress Notes (Signed)
PROGRESS NOTE    Rhonda [name]  ZOX:[id number] DOB: Oct 11, 1960 DOA: 07/22/2020 PCP: Bartholome Bill, MD     Brief Narrative:  Rhonda [name] is a  60 yr old female who presented to ED on 07/22/2020 with complaints of acute lower GI bleeding and pain. She was found to have acute hemorrhagic diverticulitis with acute blood loss anemia. She was also found to have hyperosmolar hyperglycemia. She has been given pain control, IV antibiotics and IV fluids. She has also been found to have a UTI with hematuria.  The patient has continued to have bright red blood per rectum and required blood transfusion.  GI was consulted and patient underwent EGD on 3/8 which was unremarkable.  Colonoscopy was scheduled.  New events last 24 hours / Subjective: Patient sitting on bedpan this morning.  Aside from being thirsty, she has no acute complaints.  Denies any abdominal pain.  After my morning evaluation, I was notified that colonoscopy was postponed due to hemoglobin 6.3.  Another unit of packed red blood cell was ordered.  Assessment & Plan:   Principal Problem:   Acute lower GI bleeding Active Problems:   OSA (obstructive sleep apnea)   Hyperosmolar hyperglycemic state (HHS) (HCC)   Diverticulitis of large intestine with bleeding   Liver lesion   Muscle tumor   Acute lower GI bleed Acute sigmoid diverticulitis Acute blood loss anemia -Transfused 1 unit packed red blood cell on 3/7.  Transfuse another unit of packed red blood cell today 3/9 -Status post EGD 3/8 which was unremarkable -Colonoscopy re-scheduled for 3/10 -GI following -IV Zosyn for diverticulitis  Hyperosmolar hyperglycemic state -Improved -Continue Lantus, sliding scale insulin  Neurosarcoidosis -Currently on prednisone 40 mg twice daily.  Has an appointment with a new neurologist on 08/01/2020.  Continue IV Solu-Medrol while n.p.o. for GI bleed work up   OSA -Continue CPAP nightly  Hypokalemia -Replace,  trend  Incidental CT findings -Suspected cyst or hemangioma noted near falciform ligament with outpatient [place] recommended -Intramuscular growth involving left gluteus medius noted, likely lipoma, close follow-up advised    DVT prophylaxis:  SCDs Start: 07/23/20 0419  Code Status: Full Family Communication: At bedside Disposition Plan:  Status is: Inpatient  Remains inpatient appropriate because:Ongoing diagnostic testing needed not appropriate for outpatient work up and Inpatient level of care appropriate due to severity of illness   Dispo: The patient is from: Home              Anticipated d/c is to: Home              Patient currently is not medically stable to d/c.  Transfuse blood today, colonoscopy rescheduled   Difficult to place patient No   Antimicrobials:  Anti-infectives (From admission, onward)   Start     Dose/Rate Route Frequency Ordered Stop   07/23/20 1000  piperacillin-tazobactam (ZOSYN) IVPB 3.375 g        3.375 g 12.5 mL/hr over 240 Minutes Intravenous Every 8 hours 07/23/20 0422     07/23/20 0430  piperacillin-tazobactam (ZOSYN) IVPB 3.375 g        3.375 g 100 mL/hr over 30 Minutes Intravenous  Once 07/23/20 0422 07/23/20 0548   07/23/20 0415  ciprofloxacin (CIPRO) IVPB 400 mg  Status:  Discontinued        400 mg 200 mL/hr over 60 Minutes Intravenous  Once 07/23/20 0410 07/23/20 0412   07/23/20 0415  metroNIDAZOLE (FLAGYL) IVPB 500 mg  Status:  Discontinued  500 mg 100 mL/hr over 60 Minutes Intravenous  Once 07/23/20 0410 07/23/20 0412        Objective: Vitals:   07/25/20 1233 07/25/20 1241 07/25/20 1352 07/25/20 1423  BP: 106/66  111/83 121/89  Pulse: 100  100 98  Resp: 18  20   Temp: 98 F (36.7 C) 98 F (36.7 C) 98.1 F (36.7 C) 98 F (36.7 C)  TempSrc: Oral Oral Oral Oral  SpO2: 98%  100% 100%  Weight:      Height:        Intake/Output Summary (Last 24 hours) at 07/25/2020 1453 Last data filed at 07/25/2020 0600 Gross per 24  hour  Intake 2675.04 ml  Output 500 ml  Net 2175.04 ml   Filed Weights   07/22/20 2147 07/24/20 1450  Weight: 82 kg 82 kg    Examination:  General exam: Appears calm and comfortable  Respiratory system: Clear to auscultation. Respiratory effort normal. No respiratory distress. No conversational dyspnea.  Cardiovascular system: S1 & S2 heard, RRR. No murmurs. No pedal edema. Gastrointestinal system: Abdomen is nondistended, soft and nontender. Normal bowel sounds heard. Central nervous system: Alert and oriented. No focal neurological deficits. Speech clear.  Extremities: Symmetric in appearance  Skin: No rashes, lesions or ulcers on exposed skin  Psychiatry: Judgement and insight appear normal. Mood & affect appropriate.   Data Reviewed: I have personally reviewed following labs and imaging studies  CBC: Recent Labs  Lab 07/23/20 0213 07/23/20 0420 07/23/20 1634 07/23/20 1809 07/24/20 0146 07/24/20 0510 07/25/20 0551  WBC 9.9  --   --   --   --  11.2* 9.9  NEUTROABS 9.4*  --   --   --   --  7.4  --   HGB 8.6*   < > 6.4* 6.2* 7.8* 8.1* 6.3*  HCT 25.4*   < > 20.7* 19.5* 23.3* 23.7* 18.2*  MCV 91.4  --   --   --   --  90.5 91.0  PLT 231  --   --   --   --  172 166   < > = values in this interval not displayed.   Basic Metabolic Panel: Recent Labs  Lab 07/23/20 0213 07/23/20 0432 07/23/20 1232 07/24/20 0146 07/24/20 0510 07/25/20 0551  NA 124* 125* 133*  --  132* 130*  K 4.9 4.7 4.7  --  3.8 3.4*  CL 90* 92* 98  --  97* 95*  CO2 21* 20* 21*  --  27 27  GLUCOSE 563* 492* 253* 318* 314* 212*  BUN 19 19 18   --  16 13  CREATININE 0.99 0.89 0.72  --  0.72 0.64  CALCIUM 8.7* 8.4* 8.2*  --  7.9* 7.8*   GFR: Estimated Creatinine Clearance: 80.1 mL/min (by C-G formula based on SCr of 0.64 mg/dL). Liver Function Tests: Recent Labs  Lab 07/23/20 0213  AST 35  ALT 50*  ALKPHOS 122  BILITOT 0.9  PROT 5.1*  ALBUMIN 2.5*   Recent Labs  Lab 07/23/20 0432  LIPASE  64*   No results for input(s): AMMONIA in the last 168 hours. Coagulation Profile: Recent Labs  Lab 07/23/20 0347  INR 1.0   Cardiac Enzymes: No results for input(s): CKTOTAL, CKMB, CKMBINDEX, TROPONINI in the last 168 hours. BNP (last 3 results) No results for input(s): PROBNP in the last 8760 hours. HbA1C: Recent Labs    07/23/20 0418  HGBA1C 11.8*   CBG: Recent Labs  Lab 07/24/20 1507 07/24/20  1617 07/24/20 1808 07/25/20 0748 07/25/20 1250  GLUCAP 307* 282* 249* 204* 188*   Lipid Profile: No results for input(s): CHOL, HDL, LDLCALC, TRIG, CHOLHDL, LDLDIRECT in the last 72 hours. Thyroid Function Tests: No results for input(s): TSH, T4TOTAL, FREET4, T3FREE, THYROIDAB in the last 72 hours. Anemia Panel: No results for input(s): VITAMINB12, FOLATE, FERRITIN, TIBC, IRON, RETICCTPCT in the last 72 hours. Sepsis Labs: No results for input(s): PROCALCITON, LATICACIDVEN in the last 168 hours.  Recent Results (from the past 240 hour(s))  Culture, Urine     Status: Abnormal   Collection Time: 07/23/20  7:31 AM   Specimen: Urine, Clean Catch  Result Value Ref Range Status   Specimen Description URINE, CLEAN CATCH  Final   Special Requests   Final    NONE Performed at [facility], [address] [place], [place] [address]    Culture MULTIPLE SPECIES PRESENT, SUGGEST RECOLLECTION (A)  Final   Report Status 07/24/2020 FINAL  Final  SARS CORONAVIRUS 2 (TAT 6-24 HRS) Nasopharyngeal Nasopharyngeal Swab     Status: None   Collection Time: 07/23/20  8:34 AM   Specimen: Nasopharyngeal Swab  Result Value Ref Range Status   SARS Coronavirus 2 NEGATIVE NEGATIVE Final    Comment: (NOTE) SARS-CoV-2 target nucleic acids are NOT DETECTED.  The SARS-CoV-2 RNA is generally detectable in upper and lower respiratory specimens during the acute phase of infection. Negative results do not preclude SARS-CoV-2 infection, do not rule out co-infections with other pathogens, and  should not be used as the sole basis for treatment or other patient management decisions. Negative results must be combined with clinical observations, patient history, and epidemiological information. The expected result is Negative.  Fact Sheet for Patients: SugarRoll.be  Fact Sheet for Healthcare Providers: https://www.woods-mathews.com/  This test is not yet approved or cleared by the [place] FDA and  has been authorized for detection and/or diagnosis of SARS-CoV-2 by FDA under an Emergency Use Authorization (EUA). This EUA will remain  in effect (meaning this test can be used) for the duration of the COVID-19 declaration under Se ction 564(b)(1) of the Act, 21 U.S.C. section 360bbb-3(b)(1), unless the authorization is terminated or revoked sooner.  Performed at [facility], [place] [address] [place], [place] [address]       Radiology Studies: No results found.    Scheduled Meds: . insulin aspart  0-15 Units Subcutaneous TID WC  . insulin aspart  0-5 Units Subcutaneous QHS  . insulin glargine  18 Units Subcutaneous Daily  . methylPREDNISolone (SOLU-MEDROL) injection  40 mg Intravenous Q12H  . pantoprazole (PROTONIX) IV  40 mg Intravenous Q24H  . polyethylene glycol-electrolytes  4,000 mL Oral Once   Continuous Infusions: . dextrose 5% lactated ringers    . piperacillin-tazobactam (ZOSYN)  IV 3.375 g (07/25/20 0839)     LOS: 2 days      Time spent: 25 minutes   Dessa Phi, DO [facility] 07/25/2020, 2:53 PM   Available via Epic secure chat 7am-7pm After these hours, please refer to coverage provider listed on [email or web address]

## 2020-07-25 NOTE — Progress Notes (Signed)
Inpatient Diabetes Program Recommendations  AACE/ADA: New Consensus Statement on Inpatient Glycemic Control  Target Ranges:  Prepandial:   less than 140 mg/dL      Peak postprandial:   less than 180 mg/dL (1-2 hours)      Critically ill patients:  140 - 180 mg/dL  Results for Rhonda [name], Rhonda [name] (MRN [id number]) as of 07/25/2020 06:59  Ref. Range 07/25/2020 05:51  Glucose Latest Ref Range: 70 - 99 mg/dL 212 (H)   Results for Rhonda [name], Rhonda [name] (MRN [id number]) as of 07/25/2020 06:59  Ref. Range 07/24/2020 07:11 07/24/2020 11:09 07/24/2020 15:07 07/24/2020 16:17 07/24/2020 18:08  Glucose-Capillary Latest Ref Range: 70 - 99 mg/dL 300 (H) 309 (H) 307 (H) 282 (H) 249 (H)    Review of Glycemic Control  Diabetes history: PreDM Outpatient Diabetes medications: None Current orders for Inpatient glycemic control: Lantus 12 units daily, Novolog 0-15 units TID with meals; Solumedrol 40 mg Q12H  Inpatient Diabetes Program Recommendations:    Insulin: If steroids are continued, please consider increasing Lantus to 18 units daily, adding Novolog 0-5 units QHS for bedtime correction, and once diet advanced ordering Novolog 5 units TID with meals for meal coverage if patient eats at least 50% of meals.  HbgA1C: A1C 11.8% on 07/23/20 indicating an average glucose of 292 mg/dl over the past 2-3 months. However, since hemoglobin 8.6 g/dL anticipate A1C is not completely accurate.  Thanks, Barnie Alderman, RN, MSN, CDE Diabetes Coordinator Inpatient Diabetes Program [phone] (Team Pager from 8am to 5pm)

## 2020-07-26 ENCOUNTER — Encounter (HOSPITAL_COMMUNITY): Payer: Self-pay | Admitting: Family Medicine

## 2020-07-26 ENCOUNTER — Encounter (HOSPITAL_COMMUNITY)
Admission: EM | Disposition: A | Discharge status: Home / Self Care | Payer: Self-pay | Source: Home / Self Care | Attending: Internal Medicine

## 2020-07-26 ENCOUNTER — Inpatient Hospital Stay (HOSPITAL_COMMUNITY): Payer: 59 | Admitting: Anesthesiology

## 2020-07-26 HISTORY — PX: COLONOSCOPY WITH PROPOFOL: SHX5780

## 2020-07-26 LAB — TYPE AND SCREEN
ABO/RH(D): B POS
Antibody Screen: NEGATIVE
Unit division: 0
Unit division: 0

## 2020-07-26 LAB — BASIC METABOLIC PANEL
Anion gap: 11 (ref 5–15)
BUN: <5 mg/dL — ABNORMAL LOW (ref 6–20)
CO2: 28 mmol/L (ref 22–32)
Calcium: 8.6 mg/dL — ABNORMAL LOW (ref 8.9–10.3)
Chloride: 98 mmol/L (ref 98–111)
Creatinine, Ser: 0.57 mg/dL (ref 0.44–1.00)
GFR, Estimated: >60 mL/min (ref >60)
Glucose, Bld: 202 mg/dL — ABNORMAL HIGH (ref 70–99)
Potassium: 3.7 mmol/L (ref 3.5–5.1)
Sodium: 137 mmol/L (ref 135–145)

## 2020-07-26 LAB — CBC
HCT: 29.5 % — ABNORMAL LOW (ref 36.0–46.0)
Hemoglobin: 10.4 g/dL — ABNORMAL LOW (ref 12.0–15.0)
MCH: 31 pg (ref 26.0–34.0)
MCHC: 35.3 g/dL (ref 30.0–36.0)
MCV: 87.8 fL (ref 80.0–100.0)
Platelets: 192 10*3/uL (ref 150–400)
RBC: 3.36 MIL/uL — ABNORMAL LOW (ref 3.87–5.11)
RDW: 18.8 % — ABNORMAL HIGH (ref 11.5–15.5)
WBC: 11.8 10*3/uL — ABNORMAL HIGH (ref 4.0–10.5)
nRBC: 8.9 % — ABNORMAL HIGH (ref 0.0–0.2)

## 2020-07-26 LAB — BPAM RBC
Blood Product Expiration Date: 202204052359
Blood Product Expiration Date: 202204082359
ISSUE DATE / TIME: 202203071842
ISSUE DATE / TIME: 202203091342
Unit Type and Rh: 7300
Unit Type and Rh: 7300

## 2020-07-26 LAB — GLUCOSE, CAPILLARY
Glucose-Capillary: 155 mg/dL — ABNORMAL HIGH (ref 70–99)
Glucose-Capillary: 173 mg/dL — ABNORMAL HIGH (ref 70–99)
Glucose-Capillary: 312 mg/dL — ABNORMAL HIGH (ref 70–99)
Glucose-Capillary: 442 mg/dL — ABNORMAL HIGH (ref 70–99)

## 2020-07-26 SURGERY — COLONOSCOPY WITH PROPOFOL
Anesthesia: Monitor Anesthesia Care

## 2020-07-26 MED ORDER — LACTATED RINGERS IV SOLN: INTRAVENOUS | Status: DC | PRN

## 2020-07-26 MED ORDER — LIDOCAINE 2% (20 MG/ML) 5 ML SYRINGE
INTRAMUSCULAR | Status: DC | PRN
  Administered 2020-07-26: 40 mg via INTRAVENOUS

## 2020-07-26 MED ORDER — PROPOFOL 10 MG/ML IV BOLUS
INTRAVENOUS | Status: DC | PRN
  Administered 2020-07-26 (×2): 20 mg via INTRAVENOUS

## 2020-07-26 MED ORDER — ENSURE MAX PROTEIN PO LIQD
11.0000 [oz_av] | Freq: Two times a day (BID) | ORAL | Status: DC
  Administered 2020-07-26 – 2020-07-27 (×2): 11 [oz_av] via ORAL
  Filled 2020-07-26 (×4): qty 330

## 2020-07-26 MED ORDER — ADULT MULTIVITAMIN W/MINERALS CH
1.0000 | ORAL_TABLET | Freq: Every day | ORAL | Status: DC
  Administered 2020-07-26 – 2020-07-27 (×2): 1 via ORAL
  Filled 2020-07-26 (×2): qty 1

## 2020-07-26 MED ORDER — PROSOURCE PLUS PO LIQD
30.0000 mL | Freq: Two times a day (BID) | ORAL | Status: DC
  Administered 2020-07-26 – 2020-07-27 (×3): 30 mL via ORAL
  Filled 2020-07-26 (×2): qty 30

## 2020-07-26 MED ORDER — PROPOFOL 500 MG/50ML IV EMUL
INTRAVENOUS | Status: DC | PRN
  Administered 2020-07-26: 100 ug/kg/min via INTRAVENOUS

## 2020-07-26 MED ORDER — INSULIN ASPART 100 UNIT/ML ~~LOC~~ SOLN
3.0000 [IU] | Freq: Once | SUBCUTANEOUS | Status: AC
  Administered 2020-07-26: 3 [IU] via SUBCUTANEOUS

## 2020-07-26 SURGICAL SUPPLY — 22 items

## 2020-07-26 NOTE — Interval H&P Note (Signed)
History and Physical Interval Note:  07/26/2020 11:25 AM  Rhonda [name]  has presented today for surgery, with the diagnosis of abnormal CT scan.  The various methods of treatment have been discussed with the patient and family. After consideration of risks, benefits and other options for treatment, the patient has consented to  Procedure(s): COLONOSCOPY WITH PROPOFOL (N/A) as a surgical intervention.  The patient's history has been reviewed, patient examined, no change in status, stable for surgery.  I have reviewed the patient's chart and labs.  Questions were answered to the patient's satisfaction.     Sebastain Fishbaugh D

## 2020-07-26 NOTE — Op Note (Signed)
[facility] Patient Name: Rhonda [name] Procedure Date : 07/26/2020 MRN: [id number] Attending MD: Carol Ada , MD Date of Birth: [birth year] CSN: [id number] Age: 60 Admit Type: Inpatient Procedure:                Colonoscopy Indications:              Hematochezia Providers:                Carol Ada, MD, Vista Lawman, RN, Ladona Ridgel,                            Technician Referring MD:              Medicines:                Propofol per Anesthesia Complications:            No immediate complications. Estimated Blood Loss:     Estimated blood loss: none. Procedure:                Pre-Anesthesia Assessment:                           - Prior to the procedure, a History and Physical                            was performed, and patient medications and                            allergies were reviewed. The patient's tolerance of                            previous anesthesia was also reviewed. The risks                            and benefits of the procedure and the sedation                            options and risks were discussed with the patient.                            All questions were answered, and informed consent                            was obtained. Prior Anticoagulants: The patient has                            taken no previous anticoagulant or antiplatelet                            agents. ASA Grade Assessment: III - A patient with                            severe systemic disease. After reviewing the risks                            and benefits, the  patient was deemed in                            satisfactory condition to undergo the procedure.                           - Sedation was administered by an anesthesia                            professional. Deep sedation was attained.                           After obtaining informed consent, the colonoscope                            was passed under direct vision. Throughout the                             procedure, the patient's blood pressure, pulse, and                            oxygen saturations were monitored continuously. The                            [id number]-HQ190L ([id number]) Olympus colonoscope was                            introduced through the anus and advanced to the the                            cecum, identified by appendiceal orifice and                            ileocecal valve. The colonoscopy was somewhat                            difficult due to significant looping. Successful                            completion of the procedure was aided by using                            manual pressure and straightening and shortening                            the scope to obtain bowel loop reduction. The                            patient tolerated the procedure well. The quality                            of the bowel preparation was good. The ileocecal  valve, appendiceal orifice, and rectum were                            photographed. Scope In: 11:42:04 AM Scope Out: 11:58:18 AM Scope Withdrawal Time: 0 hours 9 minutes 56 seconds  Total Procedure Duration: 0 hours 16 minutes 14 seconds  Findings:      Scattered small and large-mouthed diverticula were found in the sigmoid       colon. Impression:               - Diverticulosis in the sigmoid colon.                           - No specimens collected. Recommendation:           - Patient has a contact number available for                            emergencies. The signs and symptoms of potential                            delayed complications were discussed with the                            patient. Return to normal activities tomorrow.                            Written discharge instructions were provided to the                            patient.                           - Resume previous diet.                           - Continue present medications.                            - Follow HGB.                           - Transfuse if necessary.                           - Upon discharge follow up with PCP. Procedure Code(s):        --- Professional ---                           [phone], Colonoscopy, flexible; diagnostic, including                            collection of specimen(s) by brushing or washing,                            when performed (separate procedure) Diagnosis Code(s):        --- Professional ---  K92.1, Melena (includes Hematochezia)                           K57.30, Diverticulosis of large intestine without                            perforation or abscess without bleeding CPT copyright 2019 American [employer]. All rights reserved. The codes documented in this report are preliminary and upon coder review may  be revised to meet current compliance requirements. Carol Ada, MD Carol Ada, MD 07/26/2020 12:07:57 PM This report has been signed electronically. Number of Addenda: 0

## 2020-07-26 NOTE — Anesthesia Procedure Notes (Signed)
Procedure Name: MAC Date/Time: 07/26/2020 11:40 AM Performed by: Rande Brunt, CRNA Pre-anesthesia Checklist: Patient identified, Emergency Drugs available, Suction available, Patient being monitored and Timeout performed Patient Re-evaluated:Patient Re-evaluated prior to induction Oxygen Delivery Method: Circle system utilized Induction Type: IV induction Placement Confirmation: positive ETCO2 Dental Injury: Teeth and Oropharynx as per pre-operative assessment

## 2020-07-26 NOTE — Discharge Instructions (Signed)
Outpatient Diabetes Education  You have been referred to [facility] for outpatient diabetes education. A referral has been sent and the office will contact you for an appointment. For additional questions or to schedule an appointment, call [phone].    Carbohydrate Counting For People With Diabetes  Foods with carbohydrates make your blood glucose level go up. Learning how to count carbohydrates can help you control your blood glucose levels. First, identify the foods you eat that contain carbohydrates. Then, using the Foods with Carbohydrates chart, determine about how much carbohydrates are in your meals and snacks. Make sure you are eating foods with fiber, protein, and healthy fat along with your carbohydrate foods. Foods with Carbohydrates The following table shows carbohydrate foods that have about 15 grams of carbohydrate each. Using measuring cups, spoons, or a food scale when you first begin learning about carbohydrate counting can help you learn about the portion sizes you typically eat. The following foods have 15 grams carbohydrate each:  Grains . 1 slice bread (1 ounce)  . 1 small tortilla (6-inch size)  .  large bagel (1 ounce)  . 1/3 cup pasta or rice (cooked)  .  hamburger or hot dog bun ( ounce)  .  cup cooked cereal  .  to  cup ready-to-eat cereal  . 2 taco shells (5-inch size) Fruit . 1 small fresh fruit ( to 1 cup)  .  medium banana  . 17 small grapes (3 ounces)  . 1 cup melon or berries  .  cup canned or frozen fruit  . 2 tablespoons dried fruit (blueberries, cherries, cranberries, raisins)  .  cup unsweetened fruit juice  Starchy Vegetables .  cup cooked beans, peas, corn, potatoes/sweet potatoes  .  large baked potato (3 ounces)  . 1 cup acorn or butternut squash  Snack Foods . 3 to 6 crackers  . 8 potato chips or 13 tortilla chips ( ounce to 1 ounce)  . 3 cups popped popcorn  Dairy . 3/4 cup (6  ounces) nonfat plain yogurt, or yogurt with sugar-free sweetener  . 1 cup milk  . 1 cup plain rice, soy, coconut or flavored almond milk Sweets and Desserts .  cup ice cream or frozen yogurt  . 1 tablespoon jam, jelly, pancake syrup, table sugar, or honey  . 2 tablespoons light pancake syrup  . 1 inch square of frosted cake or 2 inch square of unfrosted cake  . 2 small cookies (2/3 ounce each) or  large cookie  Sometimes you'll have to estimate carbohydrate amounts if you don't know the exact recipe. One cup of mixed foods like soups can have 1 to 2 carbohydrate servings, while some casseroles might have 2 or more servings of carbohydrate. Foods that have less than 20 calories in each serving can be counted as "free" foods. Count 1 cup raw vegetables, or  cup cooked non-starchy vegetables as "free" foods. If you eat 3 or more servings at one meal, then count them as 1 carbohydrate serving.  Foods without Carbohydrates  Not all foods contain carbohydrates. Meat, some dairy, fats, non-starchy vegetables, and many beverages don't contain carbohydrate. So when you count carbohydrates, you can generally exclude chicken, pork, beef, fish, seafood, eggs, tofu, cheese, butter, sour cream, avocado, nuts, seeds, olives, mayonnaise, water, black coffee, unsweetened tea, and zero-calorie drinks. Vegetables with no or low carbohydrate include green beans, cauliflower, tomatoes, and onions. How much carbohydrate should I eat at each meal?  Carbohydrate   counting can help you plan your meals and manage your weight. Following are some starting points for carbohydrate intake at each meal. Work with your registered dietitian nutritionist to find the best range that works for your blood glucose and weight.   To Lose Weight To Maintain Weight  Women 2 - 3 carb servings 3 - 4 carb servings  Men 3 - 4 carb servings 4 - 5 carb servings  Checking your blood glucose after meals will help you know if you need to adjust the  timing, type, or number of carbohydrate servings in your meal plan. Achieve and keep a healthy body weight by balancing your food intake and physical activity.  Tips How should I plan my meals?  Plan for half the food on your plate to include non-starchy vegetables, like salad greens, broccoli, or carrots. Try to eat 3 to 5 servings of non-starchy vegetables every day. Have a protein food at each meal. Protein foods include chicken, fish, meat, eggs, or beans (note that beans contain carbohydrate). These two food groups (non-starchy vegetables and proteins) are low in carbohydrate. If you fill up your plate with these foods, you will eat less carbohydrate but still fill up your stomach. Try to limit your carbohydrate portion to  of the plate.  What fats are healthiest to eat?  Diabetes increases risk for heart disease. To help protect your heart, eat more healthy fats, such as olive oil, nuts, and avocado. Eat less saturated fats like butter, cream, and high-fat meats, like bacon and sausage. Avoid trans fats, which are in all foods that list "partially hydrogenated oil" as an ingredient. What should I drink?  Choose drinks that are not sweetened with sugar. The healthiest choices are water, carbonated or seltzer waters, and tea and coffee without added sugars.  Sweet drinks will make your blood glucose go up very quickly. One serving of soda or energy drink is  cup. It is best to drink these beverages only if your blood glucose is low.  Artificially sweetened, or diet drinks, typically do not increase your blood glucose if they have zero calories in them. Read labels of beverages, as some diet drinks do have carbohydrate and will raise your blood glucose. Label Reading Tips Read Nutrition Facts labels to find out how many grams of carbohydrate are in a food you want to eat. Don't forget: sometimes serving sizes on the label aren't the same as how much food you are going to eat, so you may need to  calculate how much carbohydrate is in the food you are serving yourself.   Carbohydrate Counting for People with Diabetes Sample 1-Day Menu  Breakfast  cup yogurt, low fat, low sugar (1 carbohydrate serving)   cup cereal, ready-to-eat, unsweetened (1 carbohydrate serving)  1 cup strawberries (1 carbohydrate serving)   cup almonds ( carbohydrate serving)  Lunch 1, 5 ounce can chunk light tuna  2 ounces cheese, low fat cheddar  6 whole wheat crackers (1 carbohydrate serving)  1 small apple (1 carbohydrate servings)   cup carrots ( carbohydrate serving)   cup snap peas  1 cup 1% milk (1 carbohydrate serving)   Evening Meal Stir fry made with: 3 ounces chicken  1 cup brown rice (3 carbohydrate servings)   cup broccoli ( carbohydrate serving)   cup green beans   cup onions  1 tablespoon olive oil  2 tablespoons teriyaki sauce ( carbohydrate serving)  Evening Snack 1 extra small banana (1 carbohydrate serving)  1   tablespoon peanut butter   Carbohydrate Counting for People with Diabetes Vegan Sample 1-Day Menu  Breakfast 1 cup cooked oatmeal (2 carbohydrate servings)   cup blueberries (1 carbohydrate serving)  2 tablespoons flaxseeds  1 cup soymilk fortified with calcium and vitamin D  1 cup coffee  Lunch 2 slices whole wheat bread (2 carbohydrate servings)   cup baked tofu   cup lettuce  2 slices tomato  2 slices avocado   cup baby carrots ( carbohydrate serving)  1 orange (1 carbohydrate serving)  1 cup soymilk fortified with calcium and vitamin D   Evening Meal Burrito made with: 1 6-inch corn tortilla (1 carbohydrate serving)  1 cup refried vegetarian beans (2 carbohydrate servings)   cup chopped tomatoes   cup lettuce   cup salsa  1/3 cup brown rice (1 carbohydrate serving)  1 tablespoon olive oil for rice   cup zucchini   Evening Snack 6 small whole grain crackers (1 carbohydrate serving)  2 apricots ( carbohydrate serving)   cup unsalted peanuts  ( carbohydrate serving)    Carbohydrate Counting for People with Diabetes Vegetarian (Lacto-Ovo) Sample 1-Day Menu  Breakfast 1 cup cooked oatmeal (2 carbohydrate servings)   cup blueberries (1 carbohydrate serving)  2 tablespoons flaxseeds  1 egg  1 cup 1% milk (1 carbohydrate serving)  1 cup coffee  Lunch 2 slices whole wheat bread (2 carbohydrate servings)  2 ounces low-fat cheese   cup lettuce  2 slices tomato  2 slices avocado   cup baby carrots ( carbohydrate serving)  1 orange (1 carbohydrate serving)  1 cup unsweetened tea  Evening Meal Burrito made with: 1 6-inch corn tortilla (1 carbohydrate serving)   cup refried vegetarian beans (1 carbohydrate serving)   cup tomatoes   cup lettuce   cup salsa  1/3 cup brown rice (1 carbohydrate serving)  1 tablespoon olive oil for rice   cup zucchini  1 cup 1% milk (1 carbohydrate serving)  Evening Snack 6 small whole grain crackers (1 carbohydrate serving)  2 apricots ( carbohydrate serving)   cup unsalted peanuts ( carbohydrate serving)    Copyright 2020  Academy of [employer]. All rights reserved.  Using Nutrition Labels: Carbohydrate  . Serving Size  . Look at the serving size. All the information on the label is based on this portion. . Servings Per Container  . The number of servings contained in the package. . Guidelines for Carbohydrate  . Look at the total grams of carbohydrate in the serving size.  . 1 carbohydrate choice = 15 grams of carbohydrate. Range of Carbohydrate Grams Per Choice  Carbohydrate Grams/Choice Carbohydrate Choices  6-10   11-20 1  21-25 1  26-35 2  36-40 2  41-50 3  51-55 3  56-65 4  66-70 4  71-80 5    Copyright 2020  Academy of [employer] and Dietetics. All rights reserved.   

## 2020-07-26 NOTE — Anesthesia Postprocedure Evaluation (Signed)
Anesthesia Post Note  Patient: Rhonda [name]  Procedure(s) Performed: COLONOSCOPY WITH PROPOFOL (N/A )     Patient location during evaluation: PACU Anesthesia Type: MAC Level of consciousness: awake and alert Pain management: pain level controlled Vital Signs Assessment: post-procedure vital signs reviewed and stable Respiratory status: spontaneous breathing, nonlabored ventilation, respiratory function stable and patient connected to nasal cannula oxygen Cardiovascular status: stable and blood pressure returned to baseline Postop Assessment: no apparent nausea or vomiting Anesthetic complications: no   No complications documented.  Last Vitals:  Vitals:   07/26/20 1220 07/26/20 1250  BP: (!) 115/54 115/82  Pulse:  87  Resp:  16  Temp:  36.7 C  SpO2:  100%    Last Pain:  Vitals:   07/26/20 1250  TempSrc: Oral  PainSc:                  Merlinda Frederick

## 2020-07-26 NOTE — Progress Notes (Signed)
Pharmacy Antibiotic Note  Rhonda [name] is a 60 y.o. female admitted on 07/22/2020 with intra-abdominal infection.  Pharmacy has been consulted for Zosyn dosing.  Noted PCN allergy though appears to be an infusion site reaction or infiltration as opposed to true allergy. Plan for colonoscopy when hgb stable.  Plan: Zosyn 3.375g IV q8h (4-hour infusion). Monitor clinical progress, cultures/sensitivities, renal function, abx plan   Height: 5\' 5"  (165.1 cm) Weight: 82 kg (180 lb 12.4 oz) IBW/kg (Calculated) : 57  Temp (24hrs), Avg:98 F (36.7 C), Min:97.7 F (36.5 C), Max:98.3 F (36.8 C)  Recent Labs  Lab 07/23/20 0213 07/23/20 0432 07/23/20 1232 07/24/20 0510 07/25/20 0551 07/26/20 0152  WBC 9.9  --   --  11.2* 9.9 11.8*  CREATININE 0.99 0.89 0.72 0.72 0.64 0.57    Estimated Creatinine Clearance: 80.1 mL/min (by C-G formula based on SCr of 0.57 mg/dL).    Allergies  Allergen Reactions  . Aspirin Nausea And Vomiting  . Augmentin [Amoxicillin-Pot Clavulanate] Swelling and Other (See Comments)    Hand swelled at IV site     Antimicrobials this admission:  3/7 Zosyn >>     Microbiology results:  3/7 COVID: neg 3/7 UCx: mult species, recollect      Thank you for allowing [place] to participate in this patients care. Jens Som, PharmD 07/26/2020 10:30 AM  Please check [email or web address] for unit-specific pharmacy phone numbers.

## 2020-07-26 NOTE — Anesthesia Preprocedure Evaluation (Signed)
Anesthesia Evaluation    Reviewed: reviewed documented beta blocker date and time   Airway Mallampati: II  TM Distance: >3 FB Neck ROM: Full    Dental  (+) Edentulous Upper, Edentulous Lower   Pulmonary sleep apnea and Continuous Positive Airway Pressure Ventilation ,    Pulmonary exam normal        Cardiovascular hypertension, Pt. on medications and Pt. on home beta blockers  Rhythm:Regular Rate:Normal     Neuro/Psych Seizures -,  neurosarcoidosis CVA negative psych ROS   GI/Hepatic Neg liver ROS, GERD  Medicated,Rectal bleeding   Endo/Other  diabetes, Poorly Controlled, Type 2, Insulin Dependent  Renal/GU negative Renal ROS  negative genitourinary   Musculoskeletal  (+) Arthritis ,   Abdominal (+)  Abdomen: soft. Bowel sounds: normal.  Peds  Hematology  (+) anemia ,   Anesthesia Other Findings   Reproductive/Obstetrics                             Anesthesia Physical  Anesthesia Plan  ASA: III  Anesthesia Plan: MAC   Post-op Pain Management:    Induction: Intravenous  PONV Risk Score and Plan: 2 and Propofol infusion  Airway Management Planned: Simple Face Mask, Natural Airway and Nasal Cannula  Additional Equipment: None  Intra-op Plan:   Post-operative Plan:   Informed Consent: I have reviewed the patients History and Physical, chart, labs and discussed the procedure including the risks, benefits and alternatives for the proposed anesthesia with the patient or authorized representative who has indicated his/her understanding and acceptance.     Dental advisory given  Plan Discussed with: CRNA, Anesthesiologist and Surgeon  Anesthesia Plan Comments:         Anesthesia Quick Evaluation

## 2020-07-26 NOTE — Transfer of Care (Signed)
Immediate Anesthesia Transfer of Care Note  Patient: Shiloh [place]  Procedure(s) Performed: COLONOSCOPY WITH PROPOFOL (N/A )  Patient Location: Endoscopy Unit  Anesthesia Type:MAC  Level of Consciousness: awake, alert  and oriented  Airway & Oxygen Therapy: Patient Spontanous Breathing  Post-op Assessment: Report given to RN, Post -op Vital signs reviewed and stable and Patient moving all extremities  Post vital signs: Reviewed and stable  Last Vitals:  Vitals Value Taken Time  BP 113/55 07/26/20 1205  Temp    Pulse    Resp 22 07/26/20 1206  SpO2    Vitals shown include unvalidated device data.  Last Pain:  Vitals:   07/26/20 1047  TempSrc:   PainSc: 0-No pain         Complications: No complications documented.

## 2020-07-26 NOTE — Progress Notes (Signed)
Initial Nutrition Assessment  DOCUMENTATION CODES:   Not applicable  INTERVENTION:  Ensure Max po BID, each supplement provides 150 kcal and 30 grams of protein  44ml Prosource Plus po BID, each supplement provides 100 kcals and 15 grams of protein  MVI with minerals daily   NUTRITION DIAGNOSIS:   Inadequate oral intake related to nausea as evidenced by per patient/family report.    GOAL:   Patient will meet greater than or equal to 90% of their needs    MONITOR:   PO intake,Supplement acceptance,Weight trends,Labs,I & O's  REASON FOR ASSESSMENT:   Consult Diet education  ASSESSMENT:   Pt admitted with acute lower GI bleeding, acute sigmoid diverticulitis with acute blood-loss anemia, and was found to have hyperosmolar hyperglycemia (now resolved). Pt also with a UTI with hematuria. PMH includes neurosarcoidosis, OSA, prediabetes, HTN, and GERD  3/8 s/p EGD (unremarkable)  Pt unavailable at time of RD visit. Pt out of room for colonoscopy. Per H&P, pt was in her usual state of health until 2 days PTA when she developed a small amount of rectal bleeding which was followed by large volume bloody stool on 07/22/20. Pt's husband stated that pt was complaining of lower abdominal pain, frequent nausea, and occasional non-bloody emesis (vomited once 2-3 days PTA). Per MD, pt denies N/V today.  Note pt's hemoglobin a1c was 11.8 upon admission. Diabetes education attempted, but pt unavailable/inappropriate for education at this time. Will follow-up as able. Will also place outpatient consult for diabetes education, which will be more appropriate and will provide pt with more support after discharge. Pt also receiving education/support from diabetes coordinator.   Reviewed weight history. No significant weight changes noted.   No PO intake documented.   UOP: 1732ml x24 hours Stool: 2x unmeasured stool occurrence over 24 hours  Medications: ss novolog TID w/ meals and bedtime,  18 units lantus daily, solu-medrol, protonix Labs: CBGs 155-173 (Diabetes Coordinator following)  NUTRITION - FOCUSED PHYSICAL EXAM:  Unable to perform at this time; will attempt at follow-up.   Diet Order:   Diet Order            Diet regular Room service appropriate? Yes; Fluid consistency: Thin  Diet effective now                 EDUCATION NEEDS:   Not appropriate for education at this time  Skin:  Skin Assessment: Reviewed RN Assessment  Last BM:  3/10  Height:   Ht Readings from Last 1 Encounters:  07/24/20 5\' 5"  (1.651 m)    Weight:   Wt Readings from Last 1 Encounters:  07/24/20 82 kg    BMI:  Body mass index is 30.08 kg/m.  Estimated Nutritional Needs:   Kcal:  2050-2250  Protein:  100-115 grams  Fluid:  >2L/d    Larkin Ina, MS, RD, LDN RD pager number and weekend/on-call pager number located in [place].

## 2020-07-26 NOTE — Progress Notes (Signed)
Inpatient Diabetes Program Recommendations  AACE/ADA: New Consensus Statement on Inpatient Glycemic Control   Target Ranges:  Prepandial:   less than 140 mg/dL      Peak postprandial:   less than 180 mg/dL (1-2 hours)      Critically ill patients:  140 - 180 mg/dL   Results for RIAN, KOON (MRN [id number]) as of 07/26/2020 15:13  Ref. Range 07/25/2020 07:48 07/25/2020 12:50 07/25/2020 17:39 07/25/2020 20:58 07/26/2020 08:06 07/26/2020 12:43  Glucose-Capillary Latest Ref Range: 70 - 99 mg/dL 204 (H) 188 (H) 149 (H) 172 (H) 155 (H) 173 (H)  Results for VERNETA, HAMIDI (MRN [id number]) as of 07/26/2020 15:13  Ref. Range 11/14/2019 01:09 07/23/2020 04:18  Hemoglobin A1C Latest Ref Range: 4.8 - 5.6 % 5.9 (H) 11.8 (H)   Review of Glycemic Control  Diabetes history:PreDM Outpatient Diabetes medications:None Current orders for Inpatient glycemic control:Lantus 18 units daily, Novolog 0-15 units TID with meals; Solumedrol 40 mg Q12H  Inpatient Diabetes Program Recommendations:  HbgA1C:A1C 11.8% on 3/7/22indicating an average glucose of 292 mg/dl over the past 2-3 months.However, since hemoglobin 8.6 g/dLanticipate A1C is not completely accurate.  NOTE: Spoke with patient and her husband at bedside about new diabetes diagnosis.  Patient's husband reports that he has DM and he takes insulin himself. He reports that patient use to take care of him and help him with DM management and she has given him insulin in the past. Patient reports that she has been having symptoms of hyperglycemia for awhile.  Discussed A1C results (11.8 on 07/23/20) and explained what an A1C is and informed patient that current A1C indicates an average glucose of 292 mg/dl over the past 2-3 months. Discussed basic pathophysiology of DM Type 2, basic home care, importance of checking CBGs and maintaining good CBG control to prevent long-term and short-term complications. Reviewed glucose and A1C goals.   Reviewed signs and symptoms of  hyperglycemia and hypoglycemia along with treatment for both. Discussed impact of nutrition, exercise, stress, sickness, and medications on diabetes control. Encouraged patient to eliminate surgery beverages.  Reviewed Living Well with diabetes booklet and encouraged patient's husband to read through entire book with patient (patient can not see well enough to read).  Explained that if she is able to taper off steroids then she may need to decrease DM medications.  Asked patient to check glucose as provider directs at discharge and to keep a log book of glucose readings.  Explained how the doctor she follows up with can use the log book to continue to make adjustments with DM medication if needed. Patient verbalized understanding of information discussed and she states that she has no further questions at this time related to diabetes.   RNs to provide ongoing basic DM education at bedside with this patient and engage patient to actively check blood glucose and administer insulin injections.  At time of discharge please provide Rx for glucose monitoring kit (#[id number]) and DM medications.  Thanks, Barnie Alderman, RN, MSN, CDE Diabetes Coordinator Inpatient Diabetes Program [phone] (Team Pager from 8am to 5pm)

## 2020-07-26 NOTE — Progress Notes (Signed)
PROGRESS NOTE    ARTHELIA [name]  YCX:[id number] DOB: 05/04/1961 DOA: 07/22/2020 PCP: Bartholome Bill, MD     Brief Narrative:   Rhonda [name] is a  60 yr old female who presented to ED on 07/22/2020 with complaints of acute lower GI bleeding and pain. She was found to have acute hemorrhagic diverticulitis with acute blood loss anemia. She was also found to have hyperosmolar hyperglycemia. She has been given pain control, IV antibiotics and IV fluids. She has also been found to have a UTI with hematuria.  The patient has continued to have bright red blood per rectum and required blood transfusion.  GI was consulted and patient underwent EGD on 3/8 which was unremarkable.  Colonoscopy was scheduled.  New events last 24 hours / Subjective:  Patient denies any complaints overnight, no further blood with bowel movements overnight, no chest pain, no nausea, no vomiting .   Assessment & Plan:   Principal Problem:   Acute lower GI bleeding Active Problems:   OSA (obstructive sleep apnea)   Hyperosmolar hyperglycemic state (HHS) (HCC)   Diverticulitis of large intestine with bleeding   Liver lesion   Muscle tumor   Acute lower GI bleed Acute sigmoid diverticulitis Acute blood loss anemia -Transfused 1 unit packed red blood cell on 3/7.  She received another unit of PRBC 3/9 with good response, hemoglobin as 10.4 this morning.   -Status post EGD 3/8 which was unremarkable -Plan for colonoscopy today. -GI following -IV Zosyn for diverticulitis  Hyperosmolar hyperglycemic state -Resolved -Continue Lantus, sliding scale insulin  Neurosarcoidosis -Currently on prednisone 40 mg twice daily.  Has an appointment with a new neurologist on 08/01/2020.  Continue IV Solu-Medrol while n.p.o. for GI bleed work up  -Resume home dose steroids on discharge.  OSA -Continue CPAP nightly  Hypokalemia -Replace, trend  Incidental CT findings -Suspected cyst or hemangioma noted near  falciform ligament with outpatient [place] recommended -Intramuscular growth involving left gluteus medius noted, likely lipoma, close follow-up advised    DVT prophylaxis:  SCDs Start: 07/23/20 0419  Code Status: Full Family Communication: none At bedside Disposition Plan:  Status is: Inpatient  Remains inpatient appropriate because:Ongoing diagnostic testing needed not appropriate for outpatient work up and Inpatient level of care appropriate due to severity of illness   Dispo: The patient is from: Home              Anticipated d/c is to: Home              Patient currently is not medically stable to d/c.  Pending colonoscopy   Difficult to place patient No   Antimicrobials:  Anti-infectives (From admission, onward)   Start     Dose/Rate Route Frequency Ordered Stop   07/23/20 1000  piperacillin-tazobactam (ZOSYN) IVPB 3.375 g        3.375 g 12.5 mL/hr over 240 Minutes Intravenous Every 8 hours 07/23/20 0422     07/23/20 0430  piperacillin-tazobactam (ZOSYN) IVPB 3.375 g        3.375 g 100 mL/hr over 30 Minutes Intravenous  Once 07/23/20 0422 07/23/20 0548   07/23/20 0415  ciprofloxacin (CIPRO) IVPB 400 mg  Status:  Discontinued        400 mg 200 mL/hr over 60 Minutes Intravenous  Once 07/23/20 0410 07/23/20 0412   07/23/20 0415  metroNIDAZOLE (FLAGYL) IVPB 500 mg  Status:  Discontinued        500 mg 100 mL/hr over 60 Minutes Intravenous  Once 07/23/20 0410 07/23/20 0412       Objective: Vitals:   07/25/20 2058 07/25/20 2316 07/26/20 0428 07/26/20 0759  BP: 131/78 115/78 132/78 123/82  Pulse: 90 91 90 93  Resp: 20 16 20 19   Temp: 98 F (36.7 C) 98.1 F (36.7 C) 98 F (36.7 C) 98 F (36.7 C)  TempSrc: Oral Oral Oral Oral  SpO2: 98% 100% 100% 100%  Weight:      Height:        Intake/Output Summary (Last 24 hours) at 07/26/2020 1031 Last data filed at 07/26/2020 0936 Gross per 24 hour  Intake 1345.68 ml  Output 2701 ml  Net -1355.32 ml   Filed Weights    07/22/20 2147 07/24/20 1450  Weight: 82 kg 82 kg    Examination:   Awake Alert, Oriented X 3, No new F.N deficits, Normal affect. Symmetrical Chest wall movement, Good air movement bilaterally, CTAB RRR,No Gallops,Rubs or new Murmurs, No Parasternal Heave +ve B.Sounds, Abd Soft, No tenderness, No rebound - guarding or rigidity. No Cyanosis, Clubbing , has trace edema, No new Rash or bruise     Data Reviewed: I have personally reviewed following labs and imaging studies  CBC: Recent Labs  Lab 07/23/20 0213 07/23/20 0420 07/24/20 0146 07/24/20 0510 07/25/20 0551 07/25/20 1745 07/26/20 0152  WBC 9.9  --   --  11.2* 9.9  --  11.8*  NEUTROABS 9.4*  --   --  7.4  --   --   --   HGB 8.6*   < > 7.8* 8.1* 6.3* 9.4* 10.4*  HCT 25.4*   < > 23.3* 23.7* 18.2* 27.3* 29.5*  MCV 91.4  --   --  90.5 91.0  --  87.8  PLT 231  --   --  172 166  --  192   < > = values in this interval not displayed.   Basic Metabolic Panel: Recent Labs  Lab 07/23/20 0432 07/23/20 1232 07/24/20 0146 07/24/20 0510 07/25/20 0551 07/26/20 0152  NA 125* 133*  --  132* 130* 137  K 4.7 4.7  --  3.8 3.4* 3.7  CL 92* 98  --  97* 95* 98  CO2 20* 21*  --  27 27 28   GLUCOSE 492* 253* 318* 314* 212* 202*  BUN 19 18  --  16 13 <5*  CREATININE 0.89 0.72  --  0.72 0.64 0.57  CALCIUM 8.4* 8.2*  --  7.9* 7.8* 8.6*   GFR: Estimated Creatinine Clearance: 80.1 mL/min (by C-G formula based on SCr of 0.57 mg/dL). Liver Function Tests: Recent Labs  Lab 07/23/20 0213  AST 35  ALT 50*  ALKPHOS 122  BILITOT 0.9  PROT 5.1*  ALBUMIN 2.5*   Recent Labs  Lab 07/23/20 0432  LIPASE 64*   No results for input(s): AMMONIA in the last 168 hours. Coagulation Profile: Recent Labs  Lab 07/23/20 0347  INR 1.0   Cardiac Enzymes: No results for input(s): CKTOTAL, CKMB, CKMBINDEX, TROPONINI in the last 168 hours. BNP (last 3 results) No results for input(s): PROBNP in the last 8760 hours. HbA1C: No results for  input(s): HGBA1C in the last 72 hours. CBG: Recent Labs  Lab 07/25/20 0748 07/25/20 1250 07/25/20 1739 07/25/20 2058 07/26/20 0806  GLUCAP 204* 188* 149* 172* 155*   Lipid Profile: No results for input(s): CHOL, HDL, LDLCALC, TRIG, CHOLHDL, LDLDIRECT in the last 72 hours. Thyroid Function Tests: No results for input(s): TSH, T4TOTAL, FREET4, T3FREE, THYROIDAB in the  last 72 hours. Anemia Panel: No results for input(s): VITAMINB12, FOLATE, FERRITIN, TIBC, IRON, RETICCTPCT in the last 72 hours. Sepsis Labs: No results for input(s): PROCALCITON, LATICACIDVEN in the last 168 hours.  Recent Results (from the past 240 hour(s))  Culture, Urine     Status: Abnormal   Collection Time: 07/23/20  7:31 AM   Specimen: Urine, Clean Catch  Result Value Ref Range Status   Specimen Description URINE, CLEAN CATCH  Final   Special Requests   Final    NONE Performed at [facility], [address], [place], [place] [address]    Culture MULTIPLE SPECIES PRESENT, SUGGEST RECOLLECTION (A)  Final   Report Status 07/24/2020 FINAL  Final  SARS CORONAVIRUS 2 (TAT 6-24 HRS) Nasopharyngeal Nasopharyngeal Swab     Status: None   Collection Time: 07/23/20  8:34 AM   Specimen: Nasopharyngeal Swab  Result Value Ref Range Status   SARS Coronavirus 2 NEGATIVE NEGATIVE Final    Comment: (NOTE) SARS-CoV-2 target nucleic acids are NOT DETECTED.  The SARS-CoV-2 RNA is generally detectable in upper and lower respiratory specimens during the acute phase of infection. Negative results do not preclude SARS-CoV-2 infection, do not rule out co-infections with other pathogens, and should not be used as the sole basis for treatment or other patient management decisions. Negative results must be combined with clinical observations, patient history, and epidemiological information. The expected result is Negative.  Fact Sheet for Patients: SugarRoll.be  Fact Sheet for Healthcare  Providers: https://www.woods-mathews.com/  This test is not yet approved or cleared by the [place] FDA and  has been authorized for detection and/or diagnosis of SARS-CoV-2 by FDA under an Emergency Use Authorization (EUA). This EUA will remain  in effect (meaning this test can be used) for the duration of the COVID-19 declaration under Se ction 564(b)(1) of the Act, 21 U.S.C. section 360bbb-3(b)(1), unless the authorization is terminated or revoked sooner.  Performed at [facility], [place] [address], [place], [place] [address]       Radiology Studies: No results found.    Scheduled Meds: . insulin aspart  0-15 Units Subcutaneous TID WC  . insulin aspart  0-5 Units Subcutaneous QHS  . insulin glargine  18 Units Subcutaneous Daily  . methylPREDNISolone (SOLU-MEDROL) injection  40 mg Intravenous Q12H  . pantoprazole (PROTONIX) IV  40 mg Intravenous Q24H   Continuous Infusions: . lactated ringers 75 mL/hr at 07/26/20 0813  . piperacillin-tazobactam (ZOSYN)  IV 3.375 g (07/26/20 0812)     LOS: 3 days      Time spent: 25 minutes   Dawood Elgergawy, DO [facility] 07/26/2020, 10:31 AM   Available via Epic secure chat 7am-7pm After these hours, please refer to coverage provider listed on [email or web address]

## 2020-07-27 LAB — BASIC METABOLIC PANEL
Anion gap: 10 (ref 5–15)
BUN: 8 mg/dL (ref 6–20)
CO2: 27 mmol/L (ref 22–32)
Calcium: 8.4 mg/dL — ABNORMAL LOW (ref 8.9–10.3)
Chloride: 94 mmol/L — ABNORMAL LOW (ref 98–111)
Creatinine, Ser: 0.82 mg/dL (ref 0.44–1.00)
GFR, Estimated: >60 mL/min (ref >60)
Glucose, Bld: 417 mg/dL — ABNORMAL HIGH (ref 70–99)
Potassium: 3.1 mmol/L — ABNORMAL LOW (ref 3.5–5.1)
Sodium: 131 mmol/L — ABNORMAL LOW (ref 135–145)

## 2020-07-27 LAB — CBC
HCT: 27.8 % — ABNORMAL LOW (ref 36.0–46.0)
Hemoglobin: 9.2 g/dL — ABNORMAL LOW (ref 12.0–15.0)
MCH: 30.4 pg (ref 26.0–34.0)
MCHC: 33.1 g/dL (ref 30.0–36.0)
MCV: 91.7 fL (ref 80.0–100.0)
Platelets: 181 10*3/uL (ref 150–400)
RBC: 3.03 MIL/uL — ABNORMAL LOW (ref 3.87–5.11)
RDW: 19 % — ABNORMAL HIGH (ref 11.5–15.5)
WBC: 11.6 10*3/uL — ABNORMAL HIGH (ref 4.0–10.5)
nRBC: 10.5 % — ABNORMAL HIGH (ref 0.0–0.2)

## 2020-07-27 LAB — GLUCOSE, CAPILLARY
Glucose-Capillary: 333 mg/dL — ABNORMAL HIGH (ref 70–99)
Glucose-Capillary: 355 mg/dL — ABNORMAL HIGH (ref 70–99)

## 2020-07-27 MED ORDER — POTASSIUM CHLORIDE CRYS ER 20 MEQ PO TBCR
40.0000 meq | EXTENDED_RELEASE_TABLET | Freq: Once | ORAL | Status: AC
  Administered 2020-07-27: 40 meq via ORAL
  Filled 2020-07-27: qty 2

## 2020-07-27 MED ORDER — INSULIN GLARGINE 100 UNIT/ML ~~LOC~~ SOLN
24.0000 [IU] | Freq: Every day | SUBCUTANEOUS | Status: DC
  Administered 2020-07-27: 24 [IU] via SUBCUTANEOUS
  Filled 2020-07-27: qty 0.24

## 2020-07-27 MED ORDER — FERROUS SULFATE 325 (65 FE) MG PO TBEC: 325.0000 mg | DELAYED_RELEASE_TABLET | Freq: Two times a day (BID) | ORAL | 1 refills | Status: DC

## 2020-07-27 MED ORDER — BLOOD GLUCOSE MONITOR KIT: PACK | 0 refills | Status: AC

## 2020-07-27 MED ORDER — INSULIN GLARGINE 100 UNIT/ML SOLOSTAR PEN: 18.0000 [IU] | PEN_INJECTOR | Freq: Every day | SUBCUTANEOUS | 1 refills | Status: DC

## 2020-07-27 MED ORDER — INSULIN PEN NEEDLE 29G X 5MM MISC: 0 refills | Status: AC

## 2020-07-27 NOTE — Progress Notes (Signed)
Inpatient Diabetes Program Recommendations  AACE/ADA: New Consensus Statement on Inpatient Glycemic Control   Target Ranges:  Prepandial:   less than 140 mg/dL      Peak postprandial:   less than 180 mg/dL (1-2 hours)      Critically ill patients:  140 - 180 mg/dL  Results for Rhonda [name], Rhonda [name] (MRN [id number]) as of 07/27/2020 07:00  Ref. Range 07/27/2020 03:17  Glucose Latest Ref Range: 70 - 99 mg/dL 417 (H)   Results for Rhonda [name], Rhonda [name] (MRN [id number]) as of 07/27/2020 07:00  Ref. Range 07/26/2020 08:06 07/26/2020 12:43 07/26/2020 16:33 07/26/2020 20:23  Glucose-Capillary Latest Ref Range: 70 - 99 mg/dL 155 (H) 173 (H) 312 (H) 442 (H)   Review of Glycemic Control  Diabetes history: PreDM Outpatient Diabetes medications: None Current orders for Inpatient glycemic control: Lantus 18 units daily, Novolog 0-15 units TID with meals, Novolog 0-5 units QHS; Solumedrol 40 mg Q12H  Inpatient Diabetes Program Recommendations:    Insulin: If steroids are continued, please consider increasing Lantus to 24 units daily and ordering Novolog 8 units TID with meals for meal coverage if patient eats at least 50% of meals.  Thanks, Barnie Alderman, RN, MSN, CDE Diabetes Coordinator Inpatient Diabetes Program [phone] (Team Pager from 8am to 5pm)

## 2020-07-27 NOTE — Discharge Summary (Signed)
Rhonda [name], is a 60 y.o. female  DOB Sep 04, 1960  MRN [id number].  Admission date:  07/22/2020  Admitting Physician  Vianne Bulls, MD  Discharge Date:  07/27/2020   Primary MD  Bartholome Bill, MD  Recommendations for primary care physician for things to follow:  -Please check CBC, CMP during next visit to ensure stable hemoglobin. -Patient with diagnosis of diabetes mellitus, please adjust her Lantus dose as needed. -Patient with incidental finding on CT significant for suspected cyst or hemangioma noted near falciform ligament, outpatient right upper quadrant ultrasound is recommended for follow-up.   -Intramuscular growth involving left gluteus medius noted, likely lipoma, close follow-up advised  Admission Diagnosis  Hemorrhagic shock ([place]) [R57.8] Acute lower GI bleeding [K92.2] Gastrointestinal hemorrhage, unspecified gastrointestinal hemorrhage type [K92.2]   Discharge Diagnosis  Hemorrhagic shock ([place]) [R57.8] Acute lower GI bleeding [K92.2] Gastrointestinal hemorrhage, unspecified gastrointestinal hemorrhage type [K92.2]    Principal Problem:   Acute lower GI bleeding Active Problems:   OSA (obstructive sleep apnea)   Hyperosmolar hyperglycemic state (HHS) ([place])   Diverticulitis of large intestine with bleeding   Liver lesion   Muscle tumor      Past Medical History:  Diagnosis Date  . Arthritis   . Degenerative disc disease, lumbar   . GERD (gastroesophageal reflux disease)   . Hypertension   . OSA (obstructive sleep apnea)   . Prediabetes   . Sarcoidosis of central nervous system   . Seizures ([place])   . Type 2   . Urinary incontinence     Past Surgical History:  Procedure Laterality Date  . ABDOMINAL HYSTERECTOMY    . BREAST EXCISIONAL BIOPSY Right   . BREAST SURGERY     right breast  . CESAREAN SECTION    . ESOPHAGOGASTRODUODENOSCOPY (EGD) WITH PROPOFOL N/A  07/24/2020   Procedure: ESOPHAGOGASTRODUODENOSCOPY (EGD) WITH PROPOFOL;  Surgeon: Carol Ada, MD;  Location: [facility];  Service: Endoscopy;  Laterality: N/A;  Bleeding with anemia-5 gms drop in hemoglobin       History of present illness and  Hospital Course:     Kindly see H&P for history of present illness and admission details, please review complete Labs, Consult reports and Test reports for all details in brief  HPI  from the history and physical done on the day of admission 07/22/2020.  HPI: Rhonda [name] is a 60 y.o. female with medical history significant for neurosarcoidosis, OSA, prediabetes, and GERD, now presenting to the emergency department with rectal bleeding.  Patient is accompanied by her husband who assists with the history.  Patient had been in her usual state of health when she was noted to have a small amount of rectal bleeding 2 days ago.  She then had a large volume of blood mixed with stool on 07/22/2020.  She had not been complaining of any pain but when specifically asked, she reported pain in the lower abdomen.  She frequently has nausea and occasional nonbloody vomiting per report of her husband, vomited once 2  or 3 days ago, but no blood was noted in that.  Patient denies any fevers, chills, chest pain, or cough.  She has never had a colonoscopy.  ED Course: Upon arrival to the ED, patient is found to be afebrile, saturating well on room air, tachycardic to 120s, and with stable blood pressure.  Chemistry panel is notable for glucose 563, bicarbonate 21, normal anion gap, albumin 2.5, and ALT of 50.  CBC features a normocytic anemia with hemoglobin 8.6, down from 13.9 in January.  Type and screen was performed in the emergency department and the patient was started on IV fluids, insulin infusion, given fentanyl, had antibiotics ordered, and ED physician sent a message to GI requesting routine consultation.  Hospital Course     Acute lower GI bleed Acute sigmoid  diverticulitis Acute blood loss anemia -Transfused 1 unit packed red blood cell on 3/7.  She received another unit of PRBC 3/9 with good response, hemoglobin remained stable at discharge at 9.2, she will be discharged on iron supplements. -Status post EGD 3/8 which was unremarkable, she had colonoscopy 3/10 which was significant for diverticulosis, this is most likely due to diverticular bleed, even though no signs of bleeding was noted during colonoscopy, I have discussed with GI, it is okay to resume aspirin on discharge. -Imaging on admission significant for diverticulitis, she was treated with IV Zosyn during hospital stay, afebrile, no leukocytosis, abdomen is nontender on discharge, no indication for further antibiotics on discharge..  Hyperosmolar hyperglycemic state, new diagnosis of diabetes mellitus with hyperglycemia. -Resolved -Patient with new diagnosis of diabetes mellitus, this is most likely related to her prolonged steroid use. -C 11.8 on 07/23/2020. -She was started on Lantus.   Neurosarcoidosis -Currently on prednisone 40 mg twice daily.  Has an appointment with a new neurologist on 08/01/2020.  He was on IV Solu-Medrol during hospital stay, her steroids will be resumed on discharge, and she is to follow with her neurologist regarding further management..  OSA -Continue CPAP nightly  Hypokalemia -Repleted  Incidental CT findings -Suspected cyst or hemangioma noted near falciform ligament with outpatient [place] recommended -Intramuscular growth involving left gluteus medius noted, likely lipoma, close follow-up advised   Discharge Condition:  Stable   Follow UP   Follow-up Information    Bartholome Bill, MD Follow up in 1 week(s).   Specialty: Family Medicine Contact information: [place] [address] [phone]                 Discharge Instructions  and  Discharge Medications    Discharge Instructions     Amb Referral to Nutrition and Diabetic Education   Complete by: As directed    Diet Carb Modified   Complete by: As directed    Discharge instructions   Complete by: As directed    Follow with Primary MD Bartholome Bill, MD in 7 days   Get CBC, CMP, checked  by Primary MD next visit.    Activity: As tolerated with Full fall precautions use walker/cane & assistance as needed   Disposition Home    Diet: Heart Healthy /Carb modified , with feeding assistance and aspiration precautions.  For Heart failure patients - Check your Weight same time everyday, if you gain over 2 pounds, or you develop in leg swelling, experience more shortness of breath or chest pain, call your Primary MD immediately. Follow Cardiac Low Salt Diet and 1.5 lit/day fluid restriction.   On your next  visit with your primary care physician please Get Medicines reviewed and adjusted.   Please request your Prim.MD to go over all Hospital Tests and Procedure/Radiological results at the follow up, please get all Hospital records sent to your Prim MD by signing hospital release before you go home.   If you experience worsening of your admission symptoms, develop shortness of breath, life threatening emergency, suicidal or homicidal thoughts you must seek medical attention immediately by calling 911 or calling your MD immediately  if symptoms less severe.  You Must read complete instructions/literature along with all the possible adverse reactions/side effects for all the Medicines you take and that have been prescribed to you. Take any new Medicines after you have completely understood and accpet all the possible adverse reactions/side effects.   Do not drive, operating heavy machinery, perform activities at heights, swimming or participation in water activities or provide baby sitting services if your were admitted for syncope or siezures until you have seen by Primary MD or a Neurologist and advised to do so  again.  Do not drive when taking Pain medications.    Do not take more than prescribed Pain, Sleep and Anxiety Medications  Special Instructions: If you have smoked or chewed Tobacco  in the last 2 yrs please stop smoking, stop any regular Alcohol  and or any Recreational drug use.  Wear Seat belts while driving.   Please note  You were cared for by a hospitalist during your hospital stay. If you have any questions about your discharge medications or the care you received while you were in the hospital after you are discharged, you can call the unit and asked to speak with the hospitalist on call if the hospitalist that took care of you is not available. Once you are discharged, your primary care physician will handle any further medical issues. Please note that NO REFILLS for any discharge medications will be authorized once you are discharged, as it is imperative that you return to your primary care physician (or establish a relationship with a primary care physician if you do not have one) for your aftercare needs so that they can reassess your need for medications and monitor your lab values.   Increase activity slowly   Complete by: As directed      Allergies as of 07/27/2020      Reactions   Aspirin Nausea And Vomiting   Augmentin [amoxicillin-pot Clavulanate] Swelling, Other (See Comments)   Hand swelled at IV site      Medication List    STOP taking these medications   acetaminophen 650 MG CR tablet Commonly known as: TYLENOL     TAKE these medications   aspirin 81 MG EC tablet Take 81 mg by mouth daily.   atorvastatin 80 MG tablet Commonly known as: LIPITOR Take 1 tablet (80 mg total) by mouth daily at 6 PM. What changed: when to take this   b complex vitamins tablet Take 1 tablet by mouth daily.   blood glucose meter kit and supplies Kit Dispense based on patient and insurance preference. Use up to four times daily as directed.   CALCIUM+D3 PO Take 1 tablet  by mouth daily with lunch.   ferrous sulfate 325 (65 FE) MG EC tablet Take 1 tablet (325 mg total) by mouth 2 (two) times daily.   Fish Oil 1000 MG Caps Take 1,000 mg by mouth daily with lunch.   glucosamine-chondroitin 500-400 MG tablet Take 1 tablet by mouth 3 (three) times  daily. What changed:   how much to take  when to take this   insulin glargine 100 UNIT/ML Solostar Pen Commonly known as: LANTUS Inject 18 Units into the skin daily.   Insulin Pen Needle 29G X 5MM Misc USE WITH LANTUS   melatonin 3 MG Tabs tablet Take 1 tablet (3 mg total) by mouth at bedtime.   metoprolol tartrate 25 MG tablet Commonly known as: LOPRESSOR Take 1 tablet (25 mg total) by mouth 2 (two) times daily.   multivitamin with minerals Tabs tablet Take 1 tablet by mouth daily with lunch.   pantoprazole 40 MG tablet Commonly known as: PROTONIX Take 1 tablet (40 mg total) by mouth daily.   polyethylene glycol 17 g packet Commonly known as: MIRALAX / GLYCOLAX Take 17 g by mouth 2 (two) times daily. What changed:   when to take this  reasons to take this   predniSONE 20 MG tablet Commonly known as: DELTASONE Take 40 mg by mouth in the morning and at bedtime.   PRESCRIPTION MEDICATION Inhale into the lungs See admin instructions. CPAP- At bedtime         Diet and Activity recommendation: See Discharge Instructions above   Consults obtained -  GI , Dr Benson [place].   Major procedures and Radiology Reports - PLEASE review detailed and final reports for all details, in brief -     CT ABDOMEN PELVIS W CONTRAST  Result Date: 07/23/2020 CLINICAL DATA:  Bloody stools, abdominal pain EXAM: CT ABDOMEN AND PELVIS WITH CONTRAST TECHNIQUE: Multidetector CT imaging of the abdomen and pelvis was performed using the standard protocol following bolus administration of intravenous contrast. CONTRAST:  149m OMNIPAQUE IOHEXOL 300 MG/ML  SOLN COMPARISON:  CT 11/19/2019 FINDINGS: Lower chest: Lung  bases are clear. Normal heart size. No pericardial effusion. Hepatobiliary: Diffuse hepatic hypoattenuation compatible with hepatic steatosis. Hypoattenuating focus along the posterior aspect segment 4 near the falciform ligament and gallbladder fossa measuring approximately 2 cm in size (3/18) possible cyst versus hemangioma, incompletely characterized on this exam, less readily apparent on comparison given some motion artifact in this vicinity. No concerning focal liver lesion is seen. Gallbladder is largely decompressed with otherwise normal appearance of the gallbladder and biliary tree. Pancreas: Question some faint hazy stranding about the pancreatic head and neck (6/34. Uniform enhancement of the pancreatic parenchyma. No biliary ductal dilatation. Spleen: Normal in size. No concerning splenic lesions. Adrenals/Urinary Tract: Normal adrenal glands. No concerning adrenal lesions. Kidneys are normally located with symmetric enhancement and excretion. Subcentimeter hypertension focus in the interpolar left kidney, too small to fully characterize on CT imaging but statistically likely benign. No suspicious renal lesion, urolithiasis or hydronephrosis. Urinary bladder is unremarkable for the degree of distention. Stomach/Bowel: Distal esophagus, stomach and duodenum are free of acute abnormality. No small bowel thickening or dilatation. A normal appendix is visualized. No colonic dilatation or wall thickening. Scattered pancolonic diverticula. Some focal stranding and mild mural thickening is noted in the proximal sigmoid colon centered upon a possible culprit diverticulum (7/44). Vascular/Lymphatic: Atherosclerotic calcifications within the abdominal aorta and branch vessels. No aneurysm or ectasia. No enlarged abdominopelvic lymph nodes. Reproductive: Uterus is surgically absent. No concerning adnexal lesions. Quiescent appearance of the retained ovarian parenchyma. Other: Hazy stranding adjacent the pancreatic  head and proximal body. Additional mild stranding and thickening adjacent the proximal sigmoid colon. No free air, fluid, abscess or collection is seen in the abdomen or pelvis. No bowel containing hernias. Mild body wall edema. Musculoskeletal: Lobular fat  attenuation lesion within the gluteus medius on the left measuring approximately 5.1 x 3.9 cm, previously 5.1 x 2.5 cm. Favor a slow growing lipoma in the absence of more aggressive features. The osseous structures appear diffusely demineralized which may limit detection of small or nondisplaced fractures. No acute osseous abnormality or suspicious osseous lesion. Grade 1 anterolisthesis L4 on L5. No associated spondylolysis. Multilevel discogenic and facet degenerative changes present throughout the lumbar levels some moderate to severe canal stenosis at the L4-5, L5-S1 levels as well as moderate to severe foraminal impingement. IMPRESSION: 1. Pancolonic diverticulosis with slightly more focal stranding and thickening adjacent a culprit diverticulum in the proximal sigmoid. No extraluminal gas, abscess or collection. Findings consistent with an acute uncomplicated diverticulitis. 2. Additional faint mild stranding adjacent the pancreatic head and neck. Correlate with lipase to assess for pancreatitis. 3. Hepatic steatosis. 4. Hypoattenuating focus along the posterior aspect segment 4 near the falciform ligament and gallbladder fossa measuring approximately 2 cm in size, incompletely characterized on this exam, possible cyst versus hemangioma, less readily apparent on comparison given some motion artifact in this vicinity. Could consider assessment with right upper quadrant ultrasound on a nonemergent, outpatient basis. 5. Lobular fat attenuation lesion within the left gluteus medius on the left measuring approximately 5.1 x 3.9 cm, previously 5.1 x 2.5 cm. Favor a slow growing lipoma in the absence of more aggressive features. Recommend close clinical follow-up  with repeat imaging as warranted or if new symptoms develop. 6. Multilevel discogenic and facet degenerative changes throughout the lumbar levels with some moderate to severe canal stenosis and foraminal impingement at the L4-5, L5-S1 levels. Grade 1 anterolisthesis L4 on L5 as well. 7. Aortic Atherosclerosis (ICD10-I70.0). Electronically Signed   By: Lovena Le M.D.   On: 07/23/2020 03:57   Procedure: Upper GI endoscopy Indications: Iron deficiency anemia Providers: Carol Ada, MD, Jobe Igo, RN, Elspeth Cho Tech., Technician Referring MD: Medicines: Propofol per Anesthesia Complications: No immediate complications. Estimated Blood Loss: Estimated blood loss: none. Pre-Anesthesia Assessment: - Prior to the procedure, a History and Physical was performed, and patient medications and allergies were reviewed. The patient's tolerance of previous anesthesia was also reviewed. The risks and benefits of the procedure and the sedation options and risks were discussed with the patient. All questions were answered, and informed consent was obtained. Prior Anticoagulants: The patient has taken no previous anticoagulant or antiplatelet agents. ASA Grade Assessment: III - A patient with severe systemic disease. After reviewing the risks and benefits, the patient was deemed in satisfactory condition to undergo the procedure. - Sedation was administered by an anesthesia professional. Deep sedation was attained. After obtaining informed consent, the endoscope was passed under direct vision. Throughout the procedure, the patient's blood pressure, pulse, and oxygen saturations were monitored continuously. The [id number]-H190 ([id number]) Olympus gastroscope was introduced through the mouth, and advanced to the second part of duodenum. The upper GI endoscopy was accomplished without difficulty. The patient tolerated the procedure well. Procedure: The esophagus was normal. Findings: The stomach was  normal. The examined duodenum was normal. - Normal esophagus. - Normal stomach. - Normal examined duodenum. - No specimens collected. Impression: Recommendation: - Colonoscopy tomorrow.              Procedure: Colonoscopy Indications: Hematochezia Providers: Carol Ada, MD, Vista Lawman, RN, Ladona Ridgel, Technician Referring MD: Medicines: Propofol per Anesthesia Complications: No immediate complications. Estimated Blood Loss: Estimated blood loss: none. Pre-Anesthesia Assessment: - Prior to the procedure, a History and Physical was  performed, and patient medications and allergies were reviewed. The patient's tolerance of previous anesthesia was also reviewed. The risks and benefits of the procedure and the sedation options and risks were discussed with the patient. All questions were answered, and informed consent was obtained. Prior Anticoagulants: The patient has taken no previous anticoagulant or antiplatelet agents. ASA Grade Assessment: III - A patient with severe systemic disease. After reviewing the risks and benefits, the patient was deemed in satisfactory condition to undergo the procedure. - Sedation was administered by an anesthesia professional. Deep sedation was attained. After obtaining informed consent, the colonoscope was passed under direct vision. Throughout the procedure, the patient's blood pressure, pulse, and oxygen saturations were monitored continuously. The CF-HQ190L ([id number]) Olympus colonoscope was introduced through the anus and advanced to the the cecum, identified by appendiceal orifice and ileocecal valve. The colonoscopy was somewhat difficult due to significant looping. Successful completion of the procedure was aided by using manual pressure and straightening and shortening the scope to obtain bowel loop reduction. The patient tolerated the procedure well. The quality of the bowel preparation was good. The ileocecal valve,  appendiceal orifice, and rectum were photographed.  Scope In: 11:42:04 AM Scope Out: 11:58:18 AM Scope Withdrawal Time: 9 Minutes 42 Seconds Total Procedure Duration: 52 Minutes 66 Seconds Scattered small and large-mouthed diverticula were found in the sigmoid colon. Findings: - Diverticulosis in the sigmoid colon. - No specimens collected. Impression: - Patient has a contact number available for emergencies. The signs and symptoms of potential delayed complications were discussed with the patient. Return to normal activities tomorrow. Written discharge instructions were provided to the patient. - Resume previous diet. - Continue present medicat  Recent Results (from the past 240 hour(s))  Culture, Urine     Status: Abnormal   Collection Time: 07/23/20  7:31 AM   Specimen: Urine, Clean Catch  Result Value Ref Range Status   Specimen Description URINE, CLEAN CATCH  Final   Special Requests   Final    NONE Performed at [facility], [address], [place], [place] [address]    Culture MULTIPLE SPECIES PRESENT, SUGGEST RECOLLECTION (A)  Final   Report Status 07/24/2020 FINAL  Final  SARS CORONAVIRUS 2 (TAT 6-24 HRS) Nasopharyngeal Nasopharyngeal Swab     Status: None   Collection Time: 07/23/20  8:34 AM   Specimen: Nasopharyngeal Swab  Result Value Ref Range Status   SARS Coronavirus 2 NEGATIVE NEGATIVE Final    Comment: (NOTE) SARS-CoV-2 target nucleic acids are NOT DETECTED.  The SARS-CoV-2 RNA is generally detectable in upper and lower respiratory specimens during the acute phase of infection. Negative results do not preclude SARS-CoV-2 infection, do not rule out co-infections with other pathogens, and should not be used as the sole basis for treatment or other patient management decisions. Negative results must be combined with clinical observations, patient history, and epidemiological information. The expected result is Negative.  Fact Sheet for  Patients: SugarRoll.be  Fact Sheet for Healthcare Providers: https://www.woods-mathews.com/  This test is not yet approved or cleared by the [place] FDA and  has been authorized for detection and/or diagnosis of SARS-CoV-2 by FDA under an Emergency Use Authorization (EUA). This EUA will remain  in effect (meaning this test can be used) for the duration of the COVID-19 declaration under Se ction 564(b)(1) of the Act, 21 U.S.C. section 360bbb-3(b)(1), unless the authorization is terminated or revoked sooner.  Performed at [facility], [place] [address] [place], [place]  [address]        Today   Subjective:   Rhonda [name] today has no headache,no chest or  abdominal pain,no new weakness tingling or numbness, feels much better wants to go home today.  No melena or bright red blood per rectum overnight.  Objective:   Blood pressure 115/69, pulse (!) 110, temperature 97.8 F (36.6 C), temperature source Oral, resp. rate 16, height _0  (1.651 m), weight 82 kg, SpO2 96 %.   Intake/Output Summary (Last 24 hours) at 07/27/2020 1159 Last data filed at 07/27/2020 0603 Gross per 24 hour  Intake --  Output 2200 ml  Net -2200 ml    Exam Awake Alert, Oriented x 3, No new F.N deficits, Normal affect Symmetrical Chest wall movement, Good air movement bilaterally, CTAB RRR,No Gallops,Rubs or new Murmurs, No Parasternal Heave +ve B.Sounds, Abd Soft, Non tender,  No rebound -guarding or rigidity. No Cyanosis, Clubbing or edema, No new Rash or bruise  Data Review   CBC w Diff:  Lab Results  Component Value Date   WBC 11.6 (H) 07/27/2020   HGB 9.2 (L) 07/27/2020   HCT 27.8 (L) 07/27/2020   PLT 181 07/27/2020   LYMPHOPCT 17 07/24/2020   MONOPCT 6 07/24/2020   EOSPCT 0 07/24/2020   BASOPCT 1 07/24/2020    CMP:  Lab Results  Component Value Date   NA 131 (L) 07/27/2020   NA 142 02/10/2018   K 3.1 (L) 07/27/2020   CL 94 (L)  07/27/2020   CO2 27 07/27/2020   BUN 8 07/27/2020   BUN 18 02/10/2018   CREATININE 0.82 07/27/2020   CREATININE 0.85 08/02/2015   PROT 5.1 (L) 07/23/2020   PROT 6.9 02/10/2018   ALBUMIN 2.5 (L) 07/23/2020   ALBUMIN 4.0 02/10/2018   BILITOT 0.9 07/23/2020   BILITOT <0.2 02/10/2018   ALKPHOS 122 07/23/2020   AST 35 07/23/2020   ALT 50 (H) 07/23/2020  .   Total Time in preparing paper work, data evaluation and todays exam - 47 minutes  Phillips Climes M.D on 07/27/2020 at 11:59 AM  [facility]   Office  [phone]

## 2020-07-27 NOTE — Progress Notes (Addendum)
Nutrition Follow-up  DOCUMENTATION CODES:   Not applicable  INTERVENTION:   -D/c Prosource Plus -D/c Ensure Max -Continue MVI with minerals  -Snacks TID -Educated pt on DM diet and self-management. Provided "Carbohydrate Counting for People with Diabetes" handout from [facility] Nutrition Care Manual -Referred to [facility] for further reinforcement as an outpatient  NUTRITION DIAGNOSIS:   Inadequate oral intake related to nausea as evidenced by per patient/family report.  Ongoing  GOAL:   Patient will meet greater than or equal to 90% of their needs  Progressing   MONITOR:   PO intake,Supplement acceptance,Weight trends,Labs,I & O's  REASON FOR ASSESSMENT:   Consult Diet education  ASSESSMENT:   Pt admitted with acute lower GI bleeding, acute sigmoid diverticulitis with acute blood-loss anemia, and was found to have hyperosmolar hyperglycemia (now resolved). Pt also with a UTI with hematuria. PMH includes neurosarcoidosis, OSA, prediabetes, HTN, and GERD  3/8 s/p EGD (unremarkable) 3/10 s/p colonoscopy revealed divertioculosis  Reviewed I/O's: -4.4 L x 24 hours and +1.2 L since admission  UOP: 3.2 L x 24 hours  Spoke with pt and husband at bedside. Per pt, her appetite has improved, especially since transitioning to solid food diet. She consumed 100% of oatmeal and fruit this AM.   PTA, pt reports good appetite, consuming 3 meals and 3 snacks daily (Breakfast: oatmeal and eggs; Lunch: chicken and greens; Dinner: meat and vegetable. Snacks: fresh fruit or canned fruit). Pt does not have her dentures with her, which she usually eat with, but reports she is able to find items on the menu that she is able to tolerate without them. She does not care for the Prosource or Ensure Max supplements, due to aftertaste.   She denies any weight loss.   Pt reports she had prediabetes PTA. Per pt husband, blood sugars started to rise after  initiaition of prednisone and they started cutting back on her fruit juice intake PTA due to this. Pt and husband remember speaking with DM coordinator earlier and were able to teach back what was discussed at that encounter. Focus of education was on portion sizes of carbohydrate and general diabetes self-management (checking blood sugars, taking medications, regular PCP follow-up and foot care). Pt familiar with most topics as her husband also has DM and has a daughter who works as an Therapist, sports who is involved in their care.   RD provided "Carbohydrate Counting for People with Diabetes" handout from the Academy of [employer] and Dietetics. Discussed different food groups and their effects on blood sugar, emphasizing carbohydrate-containing foods. Provided list of carbohydrates and recommended serving sizes of common foods.  Discussed importance of controlled and consistent carbohydrate intake throughout the day. Provided examples of ways to balance meals/snacks and encouraged intake of high-fiber, whole grain complex carbohydrates. Teach back method used. Expect fair to good compliance.  Labs reviewed: Na: 141, K: 3.1. CBGS: 173-442.   NUTRITION - FOCUSED PHYSICAL EXAM:  Flowsheet Row Most Recent Value  Orbital Region No depletion  Upper Arm Region Mild depletion  Thoracic and Lumbar Region No depletion  Buccal Region No depletion  Temple Region No depletion  Clavicle Bone Region No depletion  Clavicle and Acromion Bone Region No depletion  Scapular Bone Region No depletion  Dorsal Hand Mild depletion  Patellar Region No depletion  Anterior Thigh Region No depletion  Posterior Calf Region No depletion  Edema (RD Assessment) Mild  Hair Reviewed  Eyes Reviewed  Mouth Reviewed  Skin Reviewed  Nails Reviewed       Diet Order:   Diet Order            Diet regular Room service appropriate? Yes; Fluid consistency: Thin  Diet effective now                 EDUCATION NEEDS:   Education  needs have been addressed  Skin:  Skin Assessment: Reviewed RN Assessment  Last BM:  07/26/20  Height:   Ht Readings from Last 1 Encounters:  07/24/20 5\' 5"  (1.651 m)    Weight:   Wt Readings from Last 1 Encounters:  07/24/20 82 kg   BMI:  Body mass index is 30.08 kg/m.  Estimated Nutritional Needs:   Kcal:  2050-2250  Protein:  100-115 grams  Fluid:  >2L/d    Loistine Chance, RD, LDN, [place] Registered Dietitian II Certified Diabetes Care and Education Specialist Please refer to [facility] for RD and/or RD on-call/weekend/after hours pager

## 2020-07-29 ENCOUNTER — Encounter (HOSPITAL_COMMUNITY): Payer: Self-pay | Admitting: Gastroenterology

## 2020-08-06 ENCOUNTER — Encounter: Payer: Self-pay | Admitting: Neurology

## 2020-08-06 ENCOUNTER — Ambulatory Visit (INDEPENDENT_AMBULATORY_CARE_PROVIDER_SITE_OTHER): Payer: 59 | Admitting: Neurology

## 2020-08-06 VITALS — BP 120/71 | HR 84 | Ht 65.0 in | Wt 175.0 lb

## 2020-08-06 DIAGNOSIS — D8689 Sarcoidosis of other sites: Secondary | ICD-10-CM | POA: Diagnosis not present

## 2020-08-06 DIAGNOSIS — M5442 Lumbago with sciatica, left side: Secondary | ICD-10-CM

## 2020-08-06 DIAGNOSIS — I639 Cerebral infarction, unspecified: Secondary | ICD-10-CM

## 2020-08-06 DIAGNOSIS — R531 Weakness: Secondary | ICD-10-CM | POA: Diagnosis not present

## 2020-08-06 DIAGNOSIS — G8929 Other chronic pain: Secondary | ICD-10-CM

## 2020-08-06 DIAGNOSIS — Z794 Long term (current) use of insulin: Secondary | ICD-10-CM | POA: Insufficient documentation

## 2020-08-06 DIAGNOSIS — Z79899 Other long term (current) drug therapy: Secondary | ICD-10-CM

## 2020-08-06 DIAGNOSIS — E119 Type 2 diabetes mellitus without complications: Secondary | ICD-10-CM

## 2020-08-06 DIAGNOSIS — M5441 Lumbago with sciatica, right side: Secondary | ICD-10-CM

## 2020-08-06 NOTE — Progress Notes (Signed)
[facility]  PATIENT: Rhonda [name] DOB: 18-Sep-1960  REFERRING DOCTOR OR PCP:   Precious Haws, MD; Harrie Foreman, MD SOURCE: Patient, notes from Dr. Sabra Heck, notes from hospitalizations, imaging and lab results, MRI images personally reviewed.  _________________________________   HISTORICAL  CHIEF COMPLAINT:  Chief Complaint  Patient presents with  . New Patient (Initial Visit)    RM 13. Referral from Dr. Laurena Slimmer for Neurosarcoidosis. In [facility] in office today, unable to stand. Has trouble standing/walking. Medicine keeps her sleepy. Has been about 2 years since last eye doctor appt. Has hx stroke.    HISTORY OF PRESENT ILLNESS:  I had the pleasure seeing your patient, Rhonda [name], at [facility] for neurologic consultation regarding her neurosarcoid.  She is a 60 year old woman who had a pontine infarction January 2021.   She had mild right sided weakness in the arm and leg but she remained ambulatory.    In March 2021, she had dizziness (translational vertigo) and gait became off balanced.  Sh continued to worsen and started to use a walker.   She fell in June 2021 and hit her head.   The ambulance came but she was just evaluated.  Later that day she had cognitive changes and went to the [facility] ED 11/13/2019.   MRI showed severe leptomeningeal enhancement c/w Neurosarcoid.   She had an LP the next day and CSF ACE was elevated.    High dose steroids were started in the hospital.   She improved quickly and was able to use the stairs after a few weeks.  Repeat imaging on 11/19/2019 and 01/08/2020 showed improvement compared to the 11/13/2019 MRI.  MRI 06/18/2020 showed further improvement.  She continued on prednisone 40 mg p.o. twice daily  Unfortunately, she began to worsen in February.    Of note, she had bloody stools and anemia with HgB to 6.2 and CT showed pancolonic diverticulosis but no abscess.    She had upper and lower endoscopy 07/24/2020.     Notes report diverticulosis.   Her husband states she had a cauterization.  She received a blood transfusion and hemoglobin stabilized above 9.0.  Though she feels better since the blood transfusion she is still having difficulty with her gait due to reduced strength and balance.  She is on prednisone 40 mg po bid now.      Currently, she is unable to use a walker but is able to stand up.   She is slightly weaker on her right side than the left.  She has slight reduced arm strength.  She denies headaches.  No visual changes.    DATA and Images personally reviewed: MRI of the brain 06/18/2020 shows mild leptomeningeal enhancement along the brainstem, cerebellum and infundibulum.  This has improved compared to the previous MRI from 01/08/2020.  Also chronic infarction in the pons. .  Additionally, there are scattered T2/FLAIR hyperintense foci in the hemispheres that could be either chronic microvascular ischemic change or sequela of sarcoid.   The infundibulum thickening is reduced compared to the 01/08/2020 MRI.  MRI of the brain 01/08/2020 and 11/19/2019 shows similar leptomeningeal enhancement of the brainstem, upper cervical spine, lower fourth ventricle, cerebellum and pituitary infundibulum.     there is marked improvement compared to the 11/13/2019 MRI.  There is no longer enhancement in the hemispheres  MRI of the brain 11/13/2019 shows extensive leptomeningeal enhancement involving the cervical spine, cerebellum, fourth ventricle, brainstem, pituitary with infundibulum thickening and  enhancement.  Additionally there is involvement of many of the sulci in the hemispheres.  There are some foci within the hemispheres as well as a chronic lacunar infarction in the pons.  MRI of the cervical and thoracic spine 11/13/2019 shows extensive leptomeningeal contrast enhancement over the entire cervical and thoracic spinal cord consistent with neurosarcoid.  Additionally, there is increased T2 signal in the lower  brainstem, cervicomedullary junction and uppermost cervical spinal cord.   There are degenerative changes at C3-C4 through C6-C7 with mild foraminal narrowing at most levels but moderately severe foraminal narrowing to the left at C5-C6 that could impact the C6 nerve root.  Labs: She had a lumbar puncture on 11/14/2019. There is elevated protein of 592 and reduce glucose less than 20.  Increased white blood cell count of 110, dominantly lymphocytes Angiotensin-converting enzyme in the CSF 11/14/2019 was 4.8 (0-3 0.1).  Neuromyelitis optica antibodies, HIV, hep C were negative.  ESR was elevated at 64.  ANA was positive though reflex titers were not performed.  Atypical p-ANCA was posiive   REVIEW OF SYSTEMS: Constitutional: No fevers, chills, sweats, or change in appetite Eyes: No visual changes, double vision, eye pain Ear, nose and throat: No hearing loss, ear pain, nasal congestion, sore throat Cardiovascular: No chest pain, palpitations Respiratory: No shortness of breath at rest or with exertion.   No wheezes GastrointestinaI: No nausea, vomiting, diarrhea, abdominal pain, fecal incontinence Genitourinary: No dysuria, urinary retention or frequency.  No nocturia. Musculoskeletal: No neck pain, back pain Integumentary: No rash, pruritus, skin lesions Neurological: as above Psychiatric: No depression at this time.  No anxiety Endocrine: No palpitations, diaphoresis, change in appetite, change in weigh or increased thirst Hematologic/Lymphatic: No anemia, purpura, petechiae. Allergic/Immunologic: No itchy/runny eyes, nasal congestion, recent allergic reactions, rashes  ALLERGIES: Allergies  Allergen Reactions  . Aspirin Nausea And Vomiting  . Augmentin [Amoxicillin-Pot Clavulanate] Swelling and Other (See Comments)    Hand swelled at IV site    HOME MEDICATIONS:  Current Outpatient Medications:  .  aspirin 81 MG EC tablet, Take 81 mg by mouth daily., Disp: , Rfl:  .  b complex  vitamins tablet, Take 1 tablet by mouth daily., Disp: , Rfl:  .  blood glucose meter kit and supplies KIT, Dispense based on patient and insurance preference. Use up to four times daily as directed., Disp: 1 each, Rfl: 0 .  Calcium Carb-Cholecalciferol (CALCIUM+D3 PO), Take 1 tablet by mouth daily with lunch., Disp: , Rfl:  .  glucosamine-chondroitin 500-400 MG tablet, Take 1 tablet by mouth 3 (three) times daily. (Patient taking differently: Take 2 tablets by mouth daily with lunch.), Disp: 90 tablet, Rfl: 2 .  insulin glargine (LANTUS) 100 UNIT/ML Solostar Pen, Inject 18 Units into the skin daily., Disp: 15 mL, Rfl: 1 .  Insulin Pen Needle 29G X 5MM MISC, USE WITH LANTUS, Disp: 100 each, Rfl: 0 .  melatonin 3 MG TABS tablet, Take 1 tablet (3 mg total) by mouth at bedtime., Disp: 30 tablet, Rfl: 1 .  metoprolol tartrate (LOPRESSOR) 25 MG tablet, Take 1 tablet (25 mg total) by mouth 2 (two) times daily., Disp: 60 tablet, Rfl: 0 .  Multiple Vitamin (MULTIVITAMIN WITH MINERALS) TABS tablet, Take 1 tablet by mouth daily with lunch. , Disp: , Rfl:  .  Omega-3 Fatty Acids (FISH OIL) 1000 MG CAPS, Take 1,000 mg by mouth daily with lunch. , Disp: , Rfl:  .  pantoprazole (PROTONIX) 40 MG tablet, Take 1 tablet (40 mg  total) by mouth daily., Disp: , Rfl:  .  polyethylene glycol (MIRALAX / GLYCOLAX) 17 g packet, Take 17 g by mouth 2 (two) times daily. (Patient taking differently: Take 17 g by mouth daily as needed for mild constipation.), Disp: 14 each, Rfl: 0 .  predniSONE (DELTASONE) 20 MG tablet, Take 40 mg by mouth in the morning and at bedtime., Disp: , Rfl:  .  PRESCRIPTION MEDICATION, Inhale into the lungs See admin instructions. CPAP- At bedtime, Disp: , Rfl:  .  atorvastatin (LIPITOR) 80 MG tablet, Take 1 tablet (80 mg total) by mouth daily at 6 PM. (Patient taking differently: Take 80 mg by mouth at bedtime.), Disp: 30 tablet, Rfl: 2  PAST MEDICAL HISTORY: Past Medical History:  Diagnosis Date  .  Arthritis   . Degenerative disc disease, lumbar   . GERD (gastroesophageal reflux disease)   . Hypertension   . OSA (obstructive sleep apnea)   . Prediabetes   . Sarcoidosis of central nervous system   . Seizures ([place])   . Type 2   . Urinary incontinence     PAST SURGICAL HISTORY: Past Surgical History:  Procedure Laterality Date  . ABDOMINAL HYSTERECTOMY    . BREAST EXCISIONAL BIOPSY Right   . BREAST SURGERY     right breast  . CESAREAN SECTION    . COLONOSCOPY WITH PROPOFOL N/A 07/26/2020   Procedure: COLONOSCOPY WITH PROPOFOL;  Surgeon: Carol Ada, MD;  Location: [facility];  Service: Endoscopy;  Laterality: N/A;  . ESOPHAGOGASTRODUODENOSCOPY (EGD) WITH PROPOFOL N/A 07/24/2020   Procedure: ESOPHAGOGASTRODUODENOSCOPY (EGD) WITH PROPOFOL;  Surgeon: Carol Ada, MD;  Location: [facility];  Service: Endoscopy;  Laterality: N/A;  Bleeding with anemia-5 gms drop in hemoglobin    FAMILY HISTORY: Family History  Problem Relation Age of Onset  . Cancer Mother   . Hypertension Sister   . Diabetes Sister   . Diabetes Maternal Grandmother     SOCIAL HISTORY:  Social History   Socioeconomic History  . Marital status: Married    Spouse name: Not on file  . Number of children: 4  . Years of education: 53  . Highest education level: Not on file  Occupational History  . Not on file  Tobacco Use  . Smoking status: Never Smoker  . Smokeless tobacco: Never Used  Vaping Use  . Vaping Use: Never used  Substance and Sexual Activity  . Alcohol use: No  . Drug use: No  . Sexual activity: Yes    Birth control/protection: None  Other Topics Concern  . Not on file  Social History Narrative   Right handed   Tea sometimes, hot chocolate    Social Determinants of Health   Financial Resource Strain: Not on file  Food Insecurity: Not on file  Transportation Needs: Not on file  Physical Activity: Not on file  Stress: Not on file  Social Connections: Not on file  Intimate  Partner Violence: Not on file     PHYSICAL EXAM  Vitals:   08/06/20 0847  BP: 120/71  Pulse: 84  Weight: 175 lb (79.4 kg)  Height: _0  (1.651 m)    Body mass index is 29.12 kg/m.   General: The patient is well-developed and well-nourished and in no acute distress  HEENT:  Head is [place]/AT.  Sclera are anicteric.  Funduscopic exam shows normal optic discs and retinal vessels.  Neck: No carotid bruits are noted.  The neck is nontender.  Cardiovascular: The heart has a regular rate  and rhythm with a normal S1 and S2. There were no murmurs, gallops or rubs.    Skin: Extremities are without rash.  Mild edema..  Musculoskeletal:  Back is nontender  Neurologic Exam  Mental status: The patient is alert and oriented x 3 at the time of the examination. The patient has apparent normal recent and remote memory, with an apparently normal attention span and concentration ability.   Speech is normal.  Cranial nerves: Extraocular movements are full. Pupils are equal, round, and reactive to light and accomodation.  Visual fields are full.  Facial symmetry is present. There is good facial sensation to soft touch bilaterally.Facial strength is normal.  Trapezius and sternocleidomastoid strength is normal. No dysarthria is noted.  The tongue is midline, and the patient has symmetric elevation of the soft palate. No obvious hearing deficits are noted.  Motor:  Muscle bulk is normal.   Tone is normal. Strength is 4+/5 in the deltoids and 5/5 elsewhere in the arms.  Strength is 4/5 in the hip flexors and 4+/5 elsewhere in the legs.  Sensory: Sensory testing is intact to pinprick, soft touch and vibration sensation in the arms and proximal legs and reduced vibration sensation in the feet.  Coordination: Cerebellar testing reveals good finger-nose-finger and heel-to-shin bilaterally.  Gait and station: She requires bilateral support to stand.  She can only take 1 step with bilateral  support.  Reflexes: Deep tendon reflexes are symmetric and normal bilaterally.   Plantar responses are flexor.    DIAGNOSTIC DATA (LABS, IMAGING, TESTING) - I reviewed patient records, labs, notes, testing and imaging myself where available.  Lab Results  Component Value Date   WBC 11.6 (H) 07/27/2020   HGB 9.2 (L) 07/27/2020   HCT 27.8 (L) 07/27/2020   MCV 91.7 07/27/2020   PLT 181 07/27/2020      Component Value Date/Time   NA 131 (L) 07/27/2020 0317   NA 142 02/10/2018 1639   K 3.1 (L) 07/27/2020 0317   CL 94 (L) 07/27/2020 0317   CO2 27 07/27/2020 0317   GLUCOSE 417 (H) 07/27/2020 0317   BUN 8 07/27/2020 0317   BUN 18 02/10/2018 1639   CREATININE 0.82 07/27/2020 0317   CREATININE 0.85 08/02/2015 1710   CALCIUM 8.4 (L) 07/27/2020 0317   PROT 5.1 (L) 07/23/2020 0213   PROT 6.9 02/10/2018 1639   ALBUMIN 2.5 (L) 07/23/2020 0213   ALBUMIN 4.0 02/10/2018 1639   AST 35 07/23/2020 0213   ALT 50 (H) 07/23/2020 0213   ALKPHOS 122 07/23/2020 0213   BILITOT 0.9 07/23/2020 0213   BILITOT <0.2 02/10/2018 1639   GFRNONAA >60 07/27/2020 0317   GFRNONAA 78 08/02/2015 1710   GFRAA >60 01/17/2020 0546   GFRAA >89 08/02/2015 1710   Lab Results  Component Value Date   CHOL 248 (H) 05/24/2019   HDL 35 (L) 05/24/2019   LDLCALC 179 (H) 05/24/2019   TRIG 172 (H) 05/24/2019   CHOLHDL 7.1 05/24/2019   Lab Results  Component Value Date   HGBA1C 11.8 (H) 07/23/2020   Lab Results  Component Value Date   VITAMINB12 869 11/14/2019   Lab Results  Component Value Date   TSH 0.884 01/08/2020       ASSESSMENT AND PLAN  Neurosarcoidosis - Plan: QuantiFERON-TB Gold Plus, Hepatitis B core antibody, total, Hepatitis B surface antigen, Hepatitis B surface antibody,qualitative, Pan-ANCA, Angiotensin converting enzyme, CBC with Differential/Platelet, Comprehensive metabolic panel, MR BRAIN W WO CONTRAST, MR CERVICAL SPINE W WO CONTRAST,  CK  High risk medication use - Plan:  QuantiFERON-TB Gold Plus, Hepatitis B core antibody, total, Hepatitis B surface antigen, Hepatitis B surface antibody,qualitative, Pan-ANCA, Angiotensin converting enzyme, CBC with Differential/Platelet, Comprehensive metabolic panel, MR BRAIN W WO CONTRAST, MR CERVICAL SPINE W WO CONTRAST, CK  Weakness - Plan: CK  Type 2 diabetes mellitus without complication, with long-term current use of insulin (HCC)  Chronic bilateral low back pain with bilateral sciatica  Left pontine stroke ([place])   In summary, Ms. Saylor is a 60 year old woman who was diagnosed with neurosarcoidosis last June after presenting with progressive vertigo and gait dysfunction and being found to have a very abnormal MRI with leptomeningeal enhancement and foci within the brainstem and upper cervical spinal cord.  She stabilized initially on prednisone but has required high dose of 40 mg p.o. twice daily which has been difficult for her to tolerate.  Unfortunately over the last few weeks she has worsened.  Because this has also occurred in the setting of anemia from blood loss, it is uncertain if the new symptoms are related to neurosarcoidosis, poor conditioning related to her recent anemia, side effects of steroids or other process.  We need to check an MRI of the brain and cervical spine to help determine the etiology of her weakness.  If the MRI does not show significant changes from earlier in the year, then steroid myopathy need to also be considered.    She is having difficulty tolerating the prednisone and also has had difficulty controlling her diabetes.  I would like to transition her to an agent that will allow her to be on a much lower dose of prednisone.  We will start infliximab 5 mg/kg at weeks 0, week 2, week 4 and then every 4 weeks.  I will check some blood work to rule out chronic infections.  After 4 weeks, we will see if we we will try to start tapering the steroids.  She will return to see me in 6 weeks or sooner  if there are new or worsening neurologic symptoms.  Thank you for asking me to see Ms. Bodley.  Please let me know if I can be of further assistance with her or other patients in the future.   Richard A. Felecia Shelling, MD, [facility] 3/81/8403, 7:54 PM Certified in Neurology, Clinical Neurophysiology, Sleep Medicine and Neuroimaging  [facility] [address], [place], [place] [address] [phone]

## 2020-08-07 ENCOUNTER — Telehealth: Payer: Self-pay | Admitting: Neurology

## 2020-08-07 NOTE — Telephone Encounter (Signed)
[employer] auth: [id number] (exp. 08/07/20 to 09/05/20) order sent to [facility]. I sent Olin Hauser the manager at [facility] to schedule the MRI's as soon as possible.

## 2020-08-08 LAB — SPECIMEN STATUS REPORT

## 2020-08-08 LAB — CK: Total CK: 25 U/L — ABNORMAL LOW (ref 32–182)

## 2020-08-09 ENCOUNTER — Telehealth: Payer: Self-pay | Admitting: *Deleted

## 2020-08-09 LAB — HEPATITIS B SURFACE ANTIGEN: Hepatitis B Surface Ag: NEGATIVE

## 2020-08-09 LAB — CBC WITH DIFFERENTIAL/PLATELET
Basophils Absolute: 0 10*3/uL (ref 0.0–0.2)
Basos: 0 %
EOS (ABSOLUTE): 0 10*3/uL (ref 0.0–0.4)
Eos: 0 %
Hematocrit: 28.4 % — ABNORMAL LOW (ref 34.0–46.6)
Hemoglobin: 9 g/dL — ABNORMAL LOW (ref 11.1–15.9)
Immature Grans (Abs): 0.5 10*3/uL — ABNORMAL HIGH (ref 0.0–0.1)
Immature Granulocytes: 5 %
Lymphocytes Absolute: 1.2 10*3/uL (ref 0.7–3.1)
Lymphs: 12 %
MCH: 30.6 pg (ref 26.6–33.0)
MCHC: 31.7 g/dL (ref 31.5–35.7)
MCV: 97 fL (ref 79–97)
Monocytes Absolute: 0.4 10*3/uL (ref 0.1–0.9)
Monocytes: 4 %
NRBC: 3 % — ABNORMAL HIGH (ref 0–0)
Neutrophils Absolute: 8.1 10*3/uL — ABNORMAL HIGH (ref 1.4–7.0)
Neutrophils: 79 %
Platelets: 208 10*3/uL (ref 150–450)
RBC: 2.94 x10E6/uL — ABNORMAL LOW (ref 3.77–5.28)
RDW: 16.9 % — ABNORMAL HIGH (ref 11.7–15.4)
WBC: 10.2 10*3/uL (ref 3.4–10.8)

## 2020-08-09 LAB — PAN-ANCA
ANCA Proteinase 3: <3.5 U/mL (ref 0.0–3.5)
Atypical pANCA: <1:20 {titer}
C-ANCA: <1:20 {titer}
Myeloperoxidase Ab: <9 U/mL (ref 0.0–9.0)
P-ANCA: <1:20 {titer}

## 2020-08-09 LAB — COMPREHENSIVE METABOLIC PANEL
ALT: 57 IU/L — ABNORMAL HIGH (ref 0–32)
AST: 27 IU/L (ref 0–40)
Albumin/Globulin Ratio: 1.9 (ref 1.2–2.2)
Albumin: 3.2 g/dL — ABNORMAL LOW (ref 3.8–4.9)
Alkaline Phosphatase: 204 IU/L — ABNORMAL HIGH (ref 44–121)
BUN/Creatinine Ratio: 11 (ref 9–23)
BUN: 6 mg/dL (ref 6–24)
Bilirubin Total: 0.4 mg/dL (ref 0.0–1.2)
CO2: 25 mmol/L (ref 20–29)
Calcium: 9 mg/dL (ref 8.7–10.2)
Chloride: 90 mmol/L — ABNORMAL LOW (ref 96–106)
Creatinine, Ser: 0.56 mg/dL — ABNORMAL LOW (ref 0.57–1.00)
Globulin, Total: 1.7 g/dL (ref 1.5–4.5)
Glucose: 364 mg/dL — ABNORMAL HIGH (ref 65–99)
Potassium: 4.8 mmol/L (ref 3.5–5.2)
Sodium: 129 mmol/L — ABNORMAL LOW (ref 134–144)
Total Protein: 4.9 g/dL — ABNORMAL LOW (ref 6.0–8.5)
eGFR: 105 mL/min/{1.73_m2} (ref >59)

## 2020-08-09 LAB — QUANTIFERON-TB GOLD PLUS
QuantiFERON Mitogen Value: 2.07 IU/mL
QuantiFERON Nil Value: 0.06 IU/mL
QuantiFERON TB1 Ag Value: 0.05 IU/mL
QuantiFERON TB2 Ag Value: 0.04 IU/mL
QuantiFERON-TB Gold Plus: NEGATIVE

## 2020-08-09 LAB — HEPATITIS B CORE ANTIBODY, TOTAL: Hep B Core Total Ab: NEGATIVE

## 2020-08-09 LAB — HEPATITIS B SURFACE ANTIBODY,QUALITATIVE: Hep B Surface Ab, Qual: NONREACTIVE

## 2020-08-09 LAB — ANGIOTENSIN CONVERTING ENZYME: Angio Convert Enzyme: 27 U/L (ref 14–82)

## 2020-08-09 NOTE — Telephone Encounter (Signed)
Gave completed/signed orders to [facility]fusion to process for pt to get pt on Infliximab therapy. Dr. Felecia Shelling ok with drug of choice per her insurance plan.Initial dose: 5mg /kg IV in 227ml NS over 2hr at week 0,2, and 4. Subsequent doses: 5mg /kg IV in 298ml NS over 2hr every 4 weeksx11. Dx: Neurosarcoidosis.

## 2020-08-20 ENCOUNTER — Encounter (HOSPITAL_COMMUNITY): Payer: Self-pay | Admitting: Internal Medicine

## 2020-08-20 ENCOUNTER — Observation Stay (HOSPITAL_COMMUNITY)
Admission: EM | Admit: 2020-08-20 | Discharge: 2020-08-22 | Disposition: A | Discharge status: Home / Self Care | Payer: 59 | Attending: Internal Medicine | Admitting: Internal Medicine

## 2020-08-20 ENCOUNTER — Other Ambulatory Visit: Payer: Self-pay

## 2020-08-20 DIAGNOSIS — K625 Hemorrhage of anus and rectum: Secondary | ICD-10-CM | POA: Diagnosis present

## 2020-08-20 DIAGNOSIS — Z7982 Long term (current) use of aspirin: Secondary | ICD-10-CM | POA: Diagnosis not present

## 2020-08-20 DIAGNOSIS — Z886 Allergy status to analgesic agent status: Secondary | ICD-10-CM | POA: Diagnosis not present

## 2020-08-20 DIAGNOSIS — G4733 Obstructive sleep apnea (adult) (pediatric): Secondary | ICD-10-CM | POA: Diagnosis present

## 2020-08-20 DIAGNOSIS — I10 Essential (primary) hypertension: Secondary | ICD-10-CM | POA: Diagnosis not present

## 2020-08-20 DIAGNOSIS — E119 Type 2 diabetes mellitus without complications: Secondary | ICD-10-CM | POA: Diagnosis not present

## 2020-08-20 DIAGNOSIS — E785 Hyperlipidemia, unspecified: Secondary | ICD-10-CM | POA: Diagnosis present

## 2020-08-20 DIAGNOSIS — D8689 Sarcoidosis of other sites: Secondary | ICD-10-CM | POA: Diagnosis present

## 2020-08-20 DIAGNOSIS — Z794 Long term (current) use of insulin: Secondary | ICD-10-CM | POA: Diagnosis not present

## 2020-08-20 DIAGNOSIS — K922 Gastrointestinal hemorrhage, unspecified: Secondary | ICD-10-CM | POA: Diagnosis not present

## 2020-08-20 DIAGNOSIS — Z79899 Other long term (current) drug therapy: Secondary | ICD-10-CM | POA: Diagnosis not present

## 2020-08-20 LAB — CBC WITH DIFFERENTIAL/PLATELET
Abs Immature Granulocytes: 0.58 10*3/uL — ABNORMAL HIGH (ref 0.00–0.07)
Basophils Absolute: 0 10*3/uL (ref 0.0–0.1)
Basophils Relative: 0 %
Eosinophils Absolute: 0.1 10*3/uL (ref 0.0–0.5)
Eosinophils Relative: 1 %
HCT: 31.2 % — ABNORMAL LOW (ref 36.0–46.0)
Hemoglobin: 9.6 g/dL — ABNORMAL LOW (ref 12.0–15.0)
Immature Granulocytes: 6 %
Lymphocytes Relative: 10 %
Lymphs Abs: 1 10*3/uL (ref 0.7–4.0)
MCH: 31.2 pg (ref 26.0–34.0)
MCHC: 30.8 g/dL (ref 30.0–36.0)
MCV: 101.3 fL — ABNORMAL HIGH (ref 80.0–100.0)
Monocytes Absolute: 0.6 10*3/uL (ref 0.1–1.0)
Monocytes Relative: 6 %
Neutro Abs: 7.6 10*3/uL (ref 1.7–7.7)
Neutrophils Relative %: 77 %
Platelets: 187 10*3/uL (ref 150–400)
RBC: 3.08 MIL/uL — ABNORMAL LOW (ref 3.87–5.11)
RDW: 19 % — ABNORMAL HIGH (ref 11.5–15.5)
WBC: 9.9 10*3/uL (ref 4.0–10.5)
nRBC: 1.2 % — ABNORMAL HIGH (ref 0.0–0.2)

## 2020-08-20 LAB — BASIC METABOLIC PANEL
Anion gap: 12 (ref 5–15)
BUN: 8 mg/dL (ref 6–20)
CO2: 25 mmol/L (ref 22–32)
Calcium: 8.7 mg/dL — ABNORMAL LOW (ref 8.9–10.3)
Chloride: 93 mmol/L — ABNORMAL LOW (ref 98–111)
Creatinine, Ser: 0.67 mg/dL (ref 0.44–1.00)
GFR, Estimated: >60 mL/min (ref >60)
Glucose, Bld: 385 mg/dL — ABNORMAL HIGH (ref 70–99)
Potassium: 4.3 mmol/L (ref 3.5–5.1)
Sodium: 130 mmol/L — ABNORMAL LOW (ref 135–145)

## 2020-08-20 LAB — CBC
HCT: 28.7 % — ABNORMAL LOW (ref 36.0–46.0)
Hemoglobin: 9.2 g/dL — ABNORMAL LOW (ref 12.0–15.0)
MCH: 30.8 pg (ref 26.0–34.0)
MCHC: 32.1 g/dL (ref 30.0–36.0)
MCV: 96 fL (ref 80.0–100.0)
Platelets: 203 10*3/uL (ref 150–400)
RBC: 2.99 MIL/uL — ABNORMAL LOW (ref 3.87–5.11)
RDW: 18.6 % — ABNORMAL HIGH (ref 11.5–15.5)
WBC: 7.6 10*3/uL (ref 4.0–10.5)
nRBC: 0.9 % — ABNORMAL HIGH (ref 0.0–0.2)

## 2020-08-20 LAB — TYPE AND SCREEN
ABO/RH(D): B POS
Antibody Screen: NEGATIVE

## 2020-08-20 LAB — GLUCOSE, CAPILLARY
Glucose-Capillary: 376 mg/dL — ABNORMAL HIGH (ref 70–99)
Glucose-Capillary: 363 mg/dL — ABNORMAL HIGH (ref 70–99)

## 2020-08-20 LAB — POC OCCULT BLOOD, ED: Fecal Occult Bld: POSITIVE — AB

## 2020-08-20 MED ORDER — LACTATED RINGERS IV SOLN: INTRAVENOUS | Status: DC

## 2020-08-20 MED ORDER — ONDANSETRON HCL 4 MG PO TABS: 4.0000 mg | ORAL_TABLET | Freq: Four times a day (QID) | ORAL | Status: DC | PRN

## 2020-08-20 MED ORDER — INSULIN ASPART 100 UNIT/ML ~~LOC~~ SOLN
0.0000 [IU] | Freq: Three times a day (TID) | SUBCUTANEOUS | Status: DC
  Administered 2020-08-20: 15 [IU] via SUBCUTANEOUS
  Administered 2020-08-21: 6 [IU] via SUBCUTANEOUS
  Administered 2020-08-21: 11 [IU] via SUBCUTANEOUS
  Administered 2020-08-21: 8 [IU] via SUBCUTANEOUS
  Administered 2020-08-22: 3 [IU] via SUBCUTANEOUS
  Administered 2020-08-22: 8 [IU] via SUBCUTANEOUS
  Administered 2020-08-22: 11 [IU] via SUBCUTANEOUS

## 2020-08-20 MED ORDER — ATORVASTATIN CALCIUM 80 MG PO TABS
80.0000 mg | ORAL_TABLET | Freq: Every day | ORAL | Status: DC
  Administered 2020-08-20 – 2020-08-21 (×2): 80 mg via ORAL
  Filled 2020-08-20: qty 8
  Filled 2020-08-20: qty 1

## 2020-08-20 MED ORDER — METOPROLOL TARTRATE 25 MG PO TABS
25.0000 mg | ORAL_TABLET | Freq: Two times a day (BID) | ORAL | Status: DC
  Administered 2020-08-20 – 2020-08-22 (×4): 25 mg via ORAL
  Filled 2020-08-20 (×4): qty 1

## 2020-08-20 MED ORDER — PREDNISONE 20 MG PO TABS
40.0000 mg | ORAL_TABLET | Freq: Two times a day (BID) | ORAL | Status: DC
  Administered 2020-08-20 – 2020-08-22 (×4): 40 mg via ORAL
  Filled 2020-08-20 (×4): qty 2

## 2020-08-20 MED ORDER — PANTOPRAZOLE SODIUM 40 MG PO TBEC
40.0000 mg | DELAYED_RELEASE_TABLET | Freq: Every day | ORAL | Status: DC
  Administered 2020-08-20 – 2020-08-22 (×3): 40 mg via ORAL
  Filled 2020-08-20 (×3): qty 1

## 2020-08-20 MED ORDER — GABAPENTIN 100 MG PO CAPS
100.0000 mg | ORAL_CAPSULE | Freq: Three times a day (TID) | ORAL | Status: DC
  Administered 2020-08-20 – 2020-08-22 (×7): 100 mg via ORAL
  Filled 2020-08-20 (×7): qty 1

## 2020-08-20 MED ORDER — ACETAMINOPHEN 325 MG PO TABS
650.0000 mg | ORAL_TABLET | Freq: Four times a day (QID) | ORAL | Status: DC | PRN
  Administered 2020-08-21: 650 mg via ORAL
  Filled 2020-08-20: qty 2

## 2020-08-20 MED ORDER — INSULIN GLARGINE 100 UNIT/ML ~~LOC~~ SOLN
18.0000 [IU] | Freq: Every day | SUBCUTANEOUS | Status: DC
  Administered 2020-08-21: 18 [IU] via SUBCUTANEOUS
  Filled 2020-08-20: qty 0.18

## 2020-08-20 MED ORDER — ONDANSETRON HCL 4 MG/2ML IJ SOLN: 4.0000 mg | Freq: Four times a day (QID) | INTRAMUSCULAR | Status: DC | PRN

## 2020-08-20 MED ORDER — FLUCONAZOLE 100MG IVPB
100.0000 mg | INTRAVENOUS | Status: DC
  Administered 2020-08-20: 100 mg via INTRAVENOUS
  Filled 2020-08-20 (×2): qty 50

## 2020-08-20 MED ORDER — PANTOPRAZOLE SODIUM 40 MG IV SOLR
40.0000 mg | INTRAVENOUS | Status: AC
  Administered 2020-08-20: 40 mg via INTRAVENOUS
  Filled 2020-08-20: qty 40

## 2020-08-20 MED ORDER — HYDRALAZINE HCL 20 MG/ML IJ SOLN: 5.0000 mg | INTRAMUSCULAR | Status: DC | PRN

## 2020-08-20 MED ORDER — MORPHINE SULFATE (PF) 2 MG/ML IV SOLN
2.0000 mg | INTRAVENOUS | Status: DC | PRN
Start: 2020-08-20 — End: 2020-08-22
  Administered 2020-08-21: 2 mg via INTRAVENOUS
  Filled 2020-08-20 (×4): qty 1

## 2020-08-20 MED ORDER — ACETAMINOPHEN 650 MG RE SUPP: 650.0000 mg | Freq: Four times a day (QID) | RECTAL | Status: DC | PRN

## 2020-08-20 MED ORDER — INSULIN ASPART 100 UNIT/ML ~~LOC~~ SOLN
0.0000 [IU] | Freq: Every day | SUBCUTANEOUS | Status: DC
  Administered 2020-08-20: 5 [IU] via SUBCUTANEOUS

## 2020-08-20 MED ORDER — MELATONIN 3 MG PO TABS
3.0000 mg | ORAL_TABLET | Freq: Every day | ORAL | Status: DC
  Administered 2020-08-20 – 2020-08-21 (×2): 3 mg via ORAL
  Filled 2020-08-20 (×2): qty 1

## 2020-08-20 NOTE — ED Triage Notes (Signed)
Pt from home with husband for eval of BRB per rectum as well as black tarry stool onset this morning. Was hospitalized for same two weeks ago, bleeding stopped, and now has recurred. Denies pain.

## 2020-08-20 NOTE — H&P (Signed)
History and Physical    Rhonda [name] PFX:[id number] DOB: 05/25/1960 DOA: 08/20/2020  PCP:  Daisy Lazar   Consultants:  [facility] - neurology; [facility] - pulmonology; [place] - cardiology; [facility] - nephrology Patient coming from:  Home - lives with husband; NOK: Husband, [phone]  Chief Complaint:  Lower GI bleeding  HPI: Rhonda [name] is a 60 y.o. female with medical history significant of HTN; obesity; OSA; sarcoidosis; DM; and seizure d/o who was recently admitted (3/6-11) for hemorrhagic shock in the setting of acute lower GI bleeding from diverticular disease presenting with recurrent lower GI bleeding.  She did not have any bleeding from last hospitalization until this AM.  Today, she had thick red stool with fresh blood under the stool - similar to prior.  No abdominal pain, just chronic back pain.  For her pain, she takes gabapentin or occasional Tylenol.   ED Course:   Prior bleed, 5 gram drop transfused, diverticular disease without frank bleeding on C-scope.  Rebleed starting yesterday.  Copious maroon stool.  Hemodynamically stable currently.  Review of Systems: As per HPI; otherwise review of systems reviewed and negative.   Ambulatory Status:  Non-ambulatory, able to stand and transfer  COVID Vaccine Status:   Complete  Past Medical History:  Diagnosis Date  . Arthritis   . Degenerative disc disease, lumbar   . GERD (gastroesophageal reflux disease)   . Hypertension   . OSA (obstructive sleep apnea)    not currently on CPAP  . Prediabetes   . Sarcoidosis of central nervous system   . Seizures ([place])   . Type 2   . Urinary incontinence     Past Surgical History:  Procedure Laterality Date  . ABDOMINAL HYSTERECTOMY    . BREAST EXCISIONAL BIOPSY Right   . BREAST SURGERY     right breast  . CESAREAN SECTION    . COLONOSCOPY WITH PROPOFOL N/A 07/26/2020   Procedure: COLONOSCOPY WITH PROPOFOL;  Surgeon: Carol Ada, MD;  Location: [place];  Service:  Endoscopy;  Laterality: N/A;  . ESOPHAGOGASTRODUODENOSCOPY (EGD) WITH PROPOFOL N/A 07/24/2020   Procedure: ESOPHAGOGASTRODUODENOSCOPY (EGD) WITH PROPOFOL;  Surgeon: Carol Ada, MD;  Location: [place];  Service: Endoscopy;  Laterality: N/A;  Bleeding with anemia-5 gms drop in hemoglobin    Social History   Socioeconomic History  . Marital status: Married    Spouse name: Not on file  . Number of children: 4  . Years of education: 71  . Highest education level: Not on file  Occupational History  . Occupation: disabled  Tobacco Use  . Smoking status: Never Smoker  . Smokeless tobacco: Never Used  Vaping Use  . Vaping Use: Never used  Substance and Sexual Activity  . Alcohol use: No  . Drug use: No  . Sexual activity: Yes    Birth control/protection: None  Other Topics Concern  . Not on file  Social History Narrative   Right handed   Tea sometimes, hot chocolate    Social Determinants of Health   Financial Resource Strain: Not on file  Food Insecurity: Not on file  Transportation Needs: Not on file  Physical Activity: Not on file  Stress: Not on file  Social Connections: Not on file  Intimate Partner Violence: Not on file    Allergies  Allergen Reactions  . Aspirin Nausea And Vomiting  . Augmentin [Amoxicillin-Pot Clavulanate] Swelling and Other (See Comments)    Hand swelled at IV site    Family History  Problem Relation  Age of Onset  . Cancer Mother   . Hypertension Sister   . Diabetes Sister   . Diabetes Maternal Grandmother     Prior to Admission medications   Medication Sig Start Date End Date Taking? Authorizing Provider  aspirin 81 MG EC tablet Take 81 mg by mouth daily. 03/01/20   [provider]  atorvastatin (LIPITOR) 80 MG tablet Take 1 tablet (80 mg total) by mouth daily at 6 PM. Patient taking differently: Take 80 mg by mouth at bedtime. 05/24/19 01/11/20  Dessa Phi, DO  b complex vitamins tablet Take 1 tablet by mouth daily.     [provider]  blood glucose meter kit and supplies KIT Dispense based on patient and insurance preference. Use up to four times daily as directed. 07/27/20   Elgergawy, Silver Huguenin, MD  Calcium Carb-Cholecalciferol (CALCIUM+D3 PO) Take 1 tablet by mouth daily with lunch.    [provider]  glucosamine-chondroitin 500-400 MG tablet Take 1 tablet by mouth 3 (three) times daily. Patient taking differently: Take 2 tablets by mouth daily with lunch. 04/11/15   Funches, Adriana Mccallum, MD  insulin glargine (LANTUS) 100 UNIT/ML Solostar Pen Inject 18 Units into the skin daily. 07/27/20   Elgergawy, Silver Huguenin, MD  Insulin Pen Needle 29G X 5MM MISC USE WITH LANTUS 07/27/20   Elgergawy, Silver Huguenin, MD  melatonin 3 MG TABS tablet Take 1 tablet (3 mg total) by mouth at bedtime. 01/17/20   Regalado, Belkys A, MD  metoprolol tartrate (LOPRESSOR) 25 MG tablet Take 1 tablet (25 mg total) by mouth 2 (two) times daily. 01/17/20   Regalado, Belkys A, MD  Multiple Vitamin (MULTIVITAMIN WITH MINERALS) TABS tablet Take 1 tablet by mouth daily with lunch.     [provider]  Omega-3 Fatty Acids (FISH OIL) 1000 MG CAPS Take 1,000 mg by mouth daily with lunch.     [provider]  pantoprazole (PROTONIX) 40 MG tablet Take 1 tablet (40 mg total) by mouth daily. 01/17/20   Regalado, Belkys A, MD  polyethylene glycol (MIRALAX / GLYCOLAX) 17 g packet Take 17 g by mouth 2 (two) times daily. Patient taking differently: Take 17 g by mouth daily as needed for mild constipation. 01/17/20   Regalado, Belkys A, MD  predniSONE (DELTASONE) 20 MG tablet Take 40 mg by mouth in the morning and at bedtime. 07/17/20   [provider]  PRESCRIPTION MEDICATION Inhale into the lungs See admin instructions. CPAP- At bedtime    [provider]    Physical Exam: Vitals:   08/20/20 1145 08/20/20 1200 08/20/20 1215 08/20/20 1302  BP: (!) 149/79 116/84 136/85 139/86  Pulse: 79 80 80 80  Resp: (!) _0 Temp:    98.2 F (36.8 C)  TempSrc:    Oral  SpO2: 99% 100% 100% 100%     . General:  Decreased interaction, appears chronically ill . Eyes:  PERRL, EOMI, normal but partially closed lids chronically . ENT:  grossly normal hearing, lips; +oral thrush; mmm; edentulous . Neck:  no LAD, masses or thyromegaly . Cardiovascular:  RRR, no m/r/g. No LE edema.  Marland Kitchen Respiratory:   CTA bilaterally with no wheezes/rales/rhonchi.  Normal respiratory effort. . Abdomen:  soft, NT, ND, NABS . Skin:  no rash or induration seen on limited exam . Musculoskeletal:  decreased tone BUE/BLE, no bony abnormality . Lower extremity:  No LE edema.  Limited foot exam with no ulcerations.  2+ distal pulses. Marland Kitchen  Psychiatric:  Decreased mood and affect, speech sparse but appropriate, AOx3 . Neurologic:  No gross abnormalities    Radiological Exams on Admission: Independently reviewed - see discussion in A/P where applicable  No results found.  EKG: Independently reviewed.  NSR with rate 76; nonspecific ST changes with no evidence of acute ischemia, NSCSLT   Labs on Admission: I have personally reviewed the available labs and imaging studies at the time of the admission.  Pertinent labs:   Na++ 130 Glucose 385 WBC 9.9 Hgb 9.6; 9.0 on 3/21 Heme positive   Assessment/Plan Principal Problem:   Acute lower GI bleeding Active Problems:   Essential hypertension   OSA (obstructive sleep apnea)   HLD (hyperlipidemia)   Neurosarcoidosis   Type 2 diabetes mellitus without complication, with long-term current use of insulin (HCC)   Recurrent lower GI bleeding -Patient with prior admission within the month for lower GI bleeding; normal EGD and C-scope with sigmoid diverticulosis without overt bleeding -Most likely etiology is diverticular bleeding -She is afebrile at this time without tachycardia, and no leukocytosis; will not give antibiotics at this time.  -Will place in observation  -Continue to monitor  for recurrent bleeding - due to her neurosarcoidosis she does not have significant rectal tone or awareness of need for all BMs so needs to be closely monitored -CTA is the best radiologic test for localization of GI bleeding, detecting bleeding of 0.3-0.5cc/min; this could be considered if she has another episode -GI consult has been requested -With prior negative EGD/colonoscopy, small bowel bleeding may be suspected and capsule endoscopy is the most reasonable 1st step for further evaluation. -Hgb appears to be stable at this time but may be hemoconcentrated -Type and screen were done in ED.  -Monitor closely and follow cbc q12h, transfuse as necessary for Hbg <7. -Hold ASA -Continue PO Protonix for now  Neurosarcoidosis -Continue prednisone 40 mg BID for now -If recurrent/heavy bleeding, consider stress dosed steroids -Continue Neurontin  DM -A1c 11.8 on 07/23/2020 -Continue on Lantus (started recently) -Cover with moderate-scale SSI  HTN -Continue Lopressor  HLD -Continue Lipitor  OSA -Continue CPAP  Recent Incidental CT findings -Suspected cyst or hemangioma noted near falciform ligament with outpatient [place] recommended -Intramuscular growth involving left gluteus medius noted, likely lipoma, close follow-up advised     Note: This patient has been tested and is negative for the novel coronavirus COVID-19. She has been fully vaccinated against COVID-19.    DVT prophylaxis: SCDs Code Status:  Full - confirmed with patient/family Family Communication: Husband was present throughout evaluation  Disposition Plan:  The patient is from: home  Anticipated d/c is to: home without [facility] services   Anticipated d/c date will depend on clinical response to treatment, but possibly as early as tomorrow if she has excellent response to treatment  Patient is currently: acutely ill Consults called: GI Admission status: It is my clinical opinion that referral for OBSERVATION is  reasonable and necessary in this patient based on the above information provided. The aforementioned taken together are felt to place the patient at high risk for further clinical deterioration. However it is anticipated that the patient may be medically stable for discharge from the hospital within 24 to 48 hours.     Karmen Bongo MD [facility]   How to contact the [facility] Attending or Consulting provider [place] or covering provider during after hours [place], for this patient?  1. Check the care team in [facility] and look for a)  attending/consulting [facility] provider listed and b) the [facility] team listed 2. Log into [email or web address] and use [place]'s universal password to access. If you do not have the password, please contact the hospital operator. 3. Locate the [facility] provider you are looking for under [facility] and page to a number that you can be directly reached. 4. If you still have difficulty reaching the provider, please page the [facility] (Director on Call) for the Hospitalists listed on amion for assistance.   08/20/2020, 5:11 PM

## 2020-08-20 NOTE — Plan of Care (Signed)
  Problem: Education: Goal: Knowledge of General Education information will improve Description Including pain rating scale, medication(s)/side effects and non-pharmacologic comfort measures Outcome: Progressing   

## 2020-08-20 NOTE — ED Provider Notes (Signed)
[place] EMERGENCY DEPARTMENT Provider Note   CSN: [id number] Arrival date & time: 08/20/20  0802     History Chief Complaint  Patient presents with  . Rectal Bleeding    Rhonda [name] is a 60 y.o. female.  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  The rectal bleeding was seen this morning, there was both bright red dark red and some black appearing stool, she has no abdominal pain nausea or vomiting.  This was a single episode, this was very concerning to the patient and her spouse that she was just admitted to the hospital for similar symptoms.  In fact I have reviewed the medical record and it appears that about 3 weeks ago the patient was admitted with severe anemia, about a 5 g drop in hemoglobin, she required 2 units of blood transfusion and had both upper endoscopy and colonoscopy.  The upper endoscopy was normal, the lower endoscopy showed diverticula, there is no active bleeding at the time, her hemoglobin remained stable and she was discharged home.  Gastroenterologist was Dr. Carol Ada.  Past Medical History:  Diagnosis Date  . Arthritis   . Degenerative disc disease, lumbar   . GERD (gastroesophageal reflux disease)   . Hypertension   . OSA (obstructive sleep apnea)    not currently on CPAP  . Prediabetes   . Sarcoidosis of central nervous system   . Seizures ([place])   . Type 2   . Urinary incontinence     Patient Active Problem List   Diagnosis Date Noted  . 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 High risk medication use 08/06/2020  . Weakness 08/06/2020  . Type 2 diabetes mellitus without complication, with long-term current use of insulin ([place]) 08/06/2020  . Acute lower GI bleeding 07/23/2020  . Hyperosmolar hyperglycemic state (HHS)  ([place]) 07/23/2020  . Diverticulitis of large intestine with bleeding 07/23/2020  . Liver lesion 07/23/2020  . 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 HLD (hyperlipidemia) 01/12/2020  . Acute lower UTI 01/12/2020  . AMS (altered mental status) 01/11/2020  . Memory changes 01/08/2020  . Altered mental status   . Hyponatremia   . Speech disturbance   . Demyelinating changes in brain ([place]) 11/13/2019  . Left pontine stroke ([place]) 05/23/2019  . Transaminitis 05/23/2019  . Nausea and vomiting 05/23/2019  . Leukocytosis 05/23/2019  . Thoracic lymphadenopathy 08/26/2018  . Axillary lymphadenopathy 08/26/2018  . Piriformis syndrome of right side 09/29/2016  . OSA (obstructive sleep apnea) 09/27/2015  . Right hip pain 04/11/2015  . Essential hypertension 11/07/2014  . Chronic low back pain 11/07/2014  . Prediabetes 11/07/2014  . Vitamin D deficiency 11/07/2014    Past Surgical History:  Procedure Laterality Date  . ABDOMINAL HYSTERECTOMY    . BREAST EXCISIONAL BIOPSY Right   . BREAST SURGERY     right breast  . CESAREAN SECTION    . COLONOSCOPY WITH PROPOFOL N/A 07/26/2020   Procedure: COLONOSCOPY WITH PROPOFOL;  Surgeon: Carol Ada, MD;  Location: [facility];  Service: Endoscopy;  Laterality: N/A;  . ESOPHAGOGASTRODUODENOSCOPY (EGD) WITH PROPOFOL N/A 07/24/2020   Procedure: ESOPHAGOGASTRODUODENOSCOPY (EGD) WITH PROPOFOL;  Surgeon: Carol Ada, MD;  Location: [facility];  Service: Endoscopy;  Laterality: N/A;  Bleeding with anemia-5 gms drop in hemoglobin  . GIVENS CAPSULE STUDY  N/A 08/21/2020   Procedure: GIVENS CAPSULE STUDY;  Surgeon: Juanita Craver, MD;  Location: [facility]COPY;  Service: Endoscopy;  Laterality: N/A;     OB History    Gravida  5   Para      Term      Preterm      AB  1   Living  4     SAB  1   IAB      Ectopic      Multiple  1   Live Births  4           Family History  Problem Relation Age of Onset  . Cancer Mother   . Hypertension Sister   .  Diabetes Sister   . Diabetes Maternal Grandmother     Social History   Tobacco Use  . Smoking status: Never Smoker  . Smokeless tobacco: Never Used  Vaping Use  . Vaping Use: Never used  Substance Use Topics  . Alcohol use: No  . Drug use: No    Home Medications Prior to Admission medications   Medication Sig Start Date End Date Taking? Authorizing Provider  aspirin 81 MG EC tablet Take 81 mg by mouth daily. 03/01/20  Yes [provider]  b complex vitamins tablet Take 1 tablet by mouth daily.   Yes [provider]  blood glucose meter kit and supplies KIT Dispense based on patient and insurance preference. Use up to four times daily as directed. 07/27/20  Yes Elgergawy, Silver Huguenin, MD  Calcium Carb-Cholecalciferol (CALCIUM+D3 PO) Take 1 tablet by mouth daily with lunch.   Yes [provider]  FEROSUL 325 (65 Fe) MG tablet Take 325 mg by mouth 2 (two) times daily. 07/27/20  Yes [provider]  fluconazole (DIFLUCAN) 100 MG tablet Take 1 tablet (100 mg total) by mouth daily for 10 days. 08/22/20 09/01/20 Yes Shelly Coss, MD  gabapentin (NEURONTIN) 100 MG capsule Take 100 mg by mouth 3 (three) times daily. 08/01/20  Yes [provider]  glucosamine-chondroitin 500-400 MG tablet Take 1 tablet by mouth 3 (three) times daily. Patient taking differently: Take 1 tablet by mouth 2 (two) times daily. 04/11/15  Yes Funches, Josalyn, MD  insulin glargine (LANTUS) 100 UNIT/ML Solostar Pen Inject 18 Units into the skin daily. 07/27/20  Yes Elgergawy, Silver Huguenin, MD  Insulin Pen Needle 29G X 5MM MISC USE WITH LANTUS 07/27/20  Yes Elgergawy, Silver Huguenin, MD  melatonin 3 MG TABS tablet Take 1 tablet (3 mg total) by mouth at bedtime. 01/17/20  Yes Regalado, Belkys A, MD  metoprolol tartrate (LOPRESSOR) 25 MG tablet Take 1 tablet (25 mg total) by mouth 2 (two) times daily. 01/17/20  Yes Regalado, Belkys A, MD  Multiple Vitamin (MULTIVITAMIN WITH MINERALS) TABS tablet  Take 1 tablet by mouth daily with lunch.    Yes [provider]  Omega-3 Fatty Acids (FISH OIL) 1000 MG CAPS Take 1,000 mg by mouth daily with lunch.    Yes [provider]  pantoprazole (PROTONIX) 40 MG tablet Take 1 tablet (40 mg total) by mouth daily. 01/17/20  Yes Regalado, Belkys A, MD  polyethylene glycol (MIRALAX / GLYCOLAX) 17 g packet Take 17 g by mouth 2 (two) times daily. Patient taking differently: Take 17 g by mouth daily as needed for mild constipation. 01/17/20  Yes Regalado, Belkys A, MD  predniSONE (DELTASONE) 20 MG tablet Take 40 mg by mouth in the morning and at bedtime. 07/17/20  Yes [provider]  PRESCRIPTION MEDICATION Inhale into the lungs See admin instructions. CPAP- At bedtime   Yes [provider]  atorvastatin (LIPITOR) 80 MG tablet Take 1 tablet (80 mg total) by mouth daily at 6 PM. Patient not taking: Reported on 08/20/2020 05/24/19 01/11/20  Dessa Phi, DO    Allergies    Aspirin and Augmentin [amoxicillin-pot clavulanate]  Review of Systems   Review of Systems  All other systems reviewed and are negative.   Physical Exam Updated Vital Signs BP 115/72 (BP Location: Left Arm)   Pulse 80   Temp 98.4 F (36.9 C) (Oral)   Resp 17   SpO2 100%   Physical Exam Constitutional:      Comments: Well-appearing, no distress  HENT:     Head: Normocephalic and atraumatic.     Comments: Steroid features including round face    Nose: Nose normal. No congestion or rhinorrhea.     Mouth/Throat:     Comments: Clear oropharynx, thrush is present in the mouth Eyes:     Conjunctiva/sclera: Conjunctivae normal.     Pupils: Pupils are equal, round, and reactive to light.  Cardiovascular:     Rate and Rhythm: Regular rhythm.     Comments: Mild sinus tachycardia, normal pulses at the radial arteries, no murmurs Pulmonary:     Comments: No respiratory distress, normal breath sounds, normal work of breathing, speaks in full  sentences Abdominal:     Comments: Increased bowel sounds, no tenderness or masses  Musculoskeletal:     Cervical back: Normal range of motion and neck supple.     Right lower leg: Edema present.     Left lower leg: Edema present.     Comments: 1+ symmetrical pitting edema to the bilateral legs below the knees  Lymphadenopathy:     Cervical: No cervical adenopathy.  Skin:    General: Skin is warm and dry.     Coloration: Skin is not pale.  Neurological:     Comments: Awake and alert able to follow all of my commands, she has generalized weakness of her legs, she generally is in a wheelchair but is starting to walk independently, she is able to use both of her arms very well, no facial droop, speech is clear  Psychiatric:        Mood and Affect: Mood normal.     ED Results / Procedures / Treatments   Labs (all labs ordered are listed, but only abnormal results are displayed) Labs Reviewed  CBC WITH DIFFERENTIAL/PLATELET - Abnormal; Notable for the following components:      Result Value   RBC 3.08 (*)    Hemoglobin 9.6 (*)    HCT 31.2 (*)    MCV 101.3 (*)    RDW 19.0 (*)    nRBC 1.2 (*)    Abs Immature Granulocytes 0.58 (*)    All other components within normal limits  BASIC METABOLIC PANEL - Abnormal; Notable for the following components:   Sodium 130 (*)    Chloride 93 (*)    Glucose, Bld 385 (*)    Calcium 8.7 (*)    All other components within normal limits  GLUCOSE, CAPILLARY - Abnormal; Notable for the following components:   Glucose-Capillary 376 (*)    All other components within normal limits  CBC - Abnormal; Notable for the following components:   RBC 3.09 (*)    Hemoglobin 9.6 (*)    HCT 30.2 (*)    RDW 18.7 (*)  nRBC 1.7 (*)    All other components within normal limits  BASIC METABOLIC PANEL - Abnormal; Notable for the following components:   Sodium 132 (*)    Chloride 94 (*)    Glucose, Bld 241 (*)    Calcium 8.6 (*)    All other components within  normal limits  CBC - Abnormal; Notable for the following components:   RBC 2.99 (*)    Hemoglobin 9.2 (*)    HCT 28.7 (*)    RDW 18.6 (*)    nRBC 0.9 (*)    All other components within normal limits  GLUCOSE, CAPILLARY - Abnormal; Notable for the following components:   Glucose-Capillary 363 (*)    All other components within normal limits  CBC - Abnormal; Notable for the following components:   RBC 2.88 (*)    Hemoglobin 8.9 (*)    HCT 27.9 (*)    RDW 18.6 (*)    nRBC 1.6 (*)    All other components within normal limits  GLUCOSE, CAPILLARY - Abnormal; Notable for the following components:   Glucose-Capillary 293 (*)    All other components within normal limits  GLUCOSE, CAPILLARY - Abnormal; Notable for the following components:   Glucose-Capillary 334 (*)    All other components within normal limits  GLUCOSE, CAPILLARY - Abnormal; Notable for the following components:   Glucose-Capillary 184 (*)    All other components within normal limits  CBC - Abnormal; Notable for the following components:   RBC 3.27 (*)    Hemoglobin 9.9 (*)    HCT 31.3 (*)    RDW 18.7 (*)    nRBC 1.8 (*)    All other components within normal limits  GLUCOSE, CAPILLARY - Abnormal; Notable for the following components:   Glucose-Capillary 162 (*)    All other components within normal limits  GLUCOSE, CAPILLARY - Abnormal; Notable for the following components:   Glucose-Capillary 186 (*)    All other components within normal limits  GLUCOSE, CAPILLARY - Abnormal; Notable for the following components:   Glucose-Capillary 311 (*)    All other components within normal limits  GLUCOSE, CAPILLARY - Abnormal; Notable for the following components:   Glucose-Capillary 300 (*)    All other components within normal limits  POC OCCULT BLOOD, ED - Abnormal; Notable for the following components:   Fecal Occult Bld POSITIVE (*)    All other components within normal limits  TYPE AND SCREEN    EKG EKG  Interpretation  Date/Time:  Monday August 20 2020 09:32:10 EDT Ventricular Rate:  76 PR Interval:  114 QRS Duration: 88 QT Interval:  376 QTC Calculation: 423 R Axis:   -19 Text Interpretation: Sinus rhythm Atrial premature complexes Borderline short PR interval Borderline left axis deviation Abnormal R-wave progression, early transition Borderline ST depression, diffuse leads Minimal ST elevation, anterior leads since last tracing no significant change Confirmed by Noemi Chapel [phone]) on 08/20/2020 11:03:43 AM   Radiology No results found.  Procedures Procedures   Medications Ordered in ED Medications  pantoprazole (PROTONIX) injection 40 mg (40 mg Intravenous Given 08/20/20 1110)    ED Course  I have reviewed the triage vital signs and the nursing notes.  Pertinent labs & imaging results that were available during my care of the patient were reviewed by me and considered in my medical decision making (see chart for details).    MDM Rules/Calculators/A&P  The patient has some chronic debilitating diseases which leaves her wheelchair-bound, she is on an aspirin and has diverticulosis which was thought to be the source of her prior gastrointestinal bleeding.  At this time the patient will need a rectal exam, blood work done, recheck hemoglobin.  Thankfully no abdominal tenderness, no hypotension, she is mildly tachycardic at about 110 bpm and what appears to be sinus tachycardia  Chaperone present for rectal exam, there is dark red stool in the rectal vault, no masses, no fissures, no tenderness  Labs pending, will page hospitalist for admission Dr. Lorin Mercy to admit  Final Clinical Impression(s) / ED Diagnoses Final diagnoses:  Rectal bleeding    Rx / DC Orders ED Discharge Orders         Ordered    fluconazole (DIFLUCAN) 100 MG tablet  Daily        08/22/20 1546    Increase activity slowly        08/22/20 1546    Diet - low sodium heart healthy         08/22/20 1546    Discharge instructions  Status:  Canceled       Comments: 1)Please take prescribed medication as an outpatient. 2)Follow up with your PCP in a week   08/22/20 1546    Discharge instructions       Comments: 1)Please take prescribed medication as an outpatient. 2)Follow up with your PCP in a week.Do a CBC test during the follow up   08/22/20 1550           Noemi Chapel, MD 08/24/20 1115

## 2020-08-20 NOTE — Consult Note (Addendum)
UNASSIGNED PATIENT Reason for Consult: Rectal bleeding. Referring Physician: [facility].  Rhonda [name] is an 60 y.o. female.  HPI: Rhonda [name] is a 60 year old black female with multiple medical problems listed below who was in her usual state of health till this morning when she noted some blood in the stool after she took a laxative for constipation.  She has a history of constipation for which she takes MiraLAX on a as needed basis. She also takes Pantoprazole for acid reflux but denies having any dysphagia odynophagia at this time.  Most of the history been procured from discussion with the patient's husband and review of her chart.  Her hemoglobin did drop from 13.9 g/dL in January this year to 8.6 g/dL in the ER on admission the last time she was hospitalized.  Her hemoglobin was 9.6 g/dL today.  Her last admission she had a CT of the abdomen pelvis on admission revealed scattered pandiverticulosis with focal stranding and mild mural thickening in the proximal sigmoid colon along with diffuse hepatic hypoattenuation compatible with hepatic steatosis. Mild stranding was noted adjacent to the pancreatic head along with a hypoattenuation along the posterior aspect of segment 4 near the falciform ligament question cyst versus hemangioma. Patient denies using nonsteroidals and tends to use Tylenol for aches and pains. Her husband tells me she has had a stroke but as per my review of the record she has neurosarcoidosis. The husband also gives a history of having had a colonoscopy about 2 years ago when colonic polyps were removed but this was within the [facility] system and he cannot remember the name of the gastroenterologist who did her procedure. During her last hospitalization, she had a normal EGD done on 07/24/20 and a colonoscopy that revealed sigmoid diverticulosis. She was presumed to have a diverticular bleed. She denies having any abdominal pain, nausea or vomiting, no history of syncope or  near syncope.  She received 2 units of packed red blood cells on her last admission as she had a 5 gm drop in hemoglobin on admission  Past Medical History:  Diagnosis Date  . Arthritis   . Degenerative disc disease, lumbar   . GERD (gastroesophageal reflux disease)   . Hypertension   . OSA (obstructive sleep apnea)    not currently on CPAP  . Prediabetes   . Sarcoidosis of central nervous system   . Seizures ([place])   . Type 2   . Urinary incontinence     Past Surgical History:  Procedure Laterality Date  . ABDOMINAL HYSTERECTOMY    . BREAST EXCISIONAL BIOPSY Right   . BREAST SURGERY     right breast  . CESAREAN SECTION    . COLONOSCOPY WITH PROPOFOL N/A 07/26/2020   Procedure: COLONOSCOPY WITH PROPOFOL;  Surgeon: Carol Ada, MD;  Location: [place];  Service: Endoscopy;  Laterality: N/A;  . ESOPHAGOGASTRODUODENOSCOPY (EGD) WITH PROPOFOL N/A 07/24/2020   Procedure: ESOPHAGOGASTRODUODENOSCOPY (EGD) WITH PROPOFOL;  Surgeon: Carol Ada, MD;  Location: [facility];  Service: Endoscopy;  Laterality: N/A;  Bleeding with anemia-5 gms drop in hemoglobin   Family History  Problem Relation Age of Onset  . Cancer Mother   . Hypertension Sister   . Diabetes Sister   . Diabetes Maternal Grandmother    Social History:  reports that she has never smoked. She has never used smokeless tobacco. She reports that she does not drink alcohol and does not use drugs.  Allergies:  Allergies  Allergen Reactions  . Aspirin Nausea And  Vomiting  . Augmentin [Amoxicillin-Pot Clavulanate] Swelling and Other (See Comments)    Hand swelled at IV site   Medications: I have reviewed the patient's current medications.  Results for orders placed or performed during the hospital encounter of 08/20/20 (from the past 48 hour(s))  POC occult blood, ED     Status: Abnormal   Collection Time: 08/20/20  8:31 AM  Result Value Ref Range   Fecal Occult Bld POSITIVE (A) NEGATIVE  CBC with  Differential/Platelet     Status: Abnormal   Collection Time: 08/20/20  9:10 AM  Result Value Ref Range   WBC 9.9 4.0 - 10.5 K/uL   RBC 3.08 (L) 3.87 - 5.11 MIL/uL   Hemoglobin 9.6 (L) 12.0 - 15.0 g/dL   HCT 31.2 (L) 36.0 - 46.0 %   MCV 101.3 (H) 80.0 - 100.0 fL   MCH 31.2 26.0 - 34.0 pg   MCHC 30.8 30.0 - 36.0 g/dL   RDW 19.0 (H) 11.5 - 15.5 %   Platelets 187 150 - 400 K/uL   nRBC 1.2 (H) 0.0 - 0.2 %   Neutrophils Relative % 77 %   Neutro Abs 7.6 1.7 - 7.7 K/uL   Lymphocytes Relative 10 %   Lymphs Abs 1.0 0.7 - 4.0 K/uL   Monocytes Relative 6 %   Monocytes Absolute 0.6 0.1 - 1.0 K/uL   Eosinophils Relative 1 %   Eosinophils Absolute 0.1 0.0 - 0.5 K/uL   Basophils Relative 0 %   Basophils Absolute 0.0 0.0 - 0.1 K/uL   WBC Morphology See Note     Comment: Mild Left Shift. 1 to 5% Metas and Myelos, Occ Pro Noted.   Immature Granulocytes 6 %   Abs Immature Granulocytes 0.58 (H) 0.00 - 0.07 K/uL   Acanthocytes PRESENT    Tear Drop Cells PRESENT    Polychromasia PRESENT     Comment: Performed at [facility], [place] [address] [place], [place] [address]  Basic metabolic panel     Status: Abnormal   Collection Time: 08/20/20  9:10 AM  Result Value Ref Range   Sodium 130 (L) 135 - 145 mmol/L   Potassium 4.3 3.5 - 5.1 mmol/L   Chloride 93 (L) 98 - 111 mmol/L   CO2 25 22 - 32 mmol/L   Glucose, Bld 385 (H) 70 - 99 mg/dL    Comment: Glucose reference range applies only to samples taken after fasting for at least 8 hours.   BUN 8 6 - 20 mg/dL   Creatinine, Ser 0.67 0.44 - 1.00 mg/dL   Calcium 8.7 (L) 8.9 - 10.3 mg/dL   GFR, Estimated >60 >60 mL/min    Comment: (NOTE) Calculated using the CKD-EPI Creatinine Equation (2021)    Anion gap 12 5 - 15    Comment: Performed at [facility] [address], [place], [place] [address]  Type and screen     Status: None   Collection Time: 08/20/20 10:56 AM  Result Value Ref Range   ABO/RH(D) B POS    Antibody Screen NEG     Sample Expiration      08/23/2020,2359 Performed at [facility], [place] [address] [place], [place] [address]      Review of Systems  Eyes: Negative.   Respiratory: Negative.   Cardiovascular: Negative.   Gastrointestinal: Positive for anal bleeding, blood in stool and constipation. Negative for abdominal distention, abdominal pain, diarrhea, nausea, rectal pain and vomiting.  Endocrine: Negative.  Genitourinary: Negative.   Musculoskeletal: Positive for arthralgias, gait problem and myalgias. Negative for joint swelling, neck pain and neck stiffness.  Allergic/Immunologic: Negative.   Neurological: Negative for dizziness, tremors, seizures, syncope, facial asymmetry, speech difficulty, weakness, light-headedness, numbness and headaches.  Hematological: Negative.   Psychiatric/Behavioral: Negative.    Blood pressure 139/86, pulse 80, temperature 98.2 F (36.8 C), temperature source Oral, resp. rate 18, SpO2 100 %. Physical Exam Constitutional:      General: She is not in acute distress.    Appearance: She is ill-appearing. She is not diaphoretic.  HENT:     Head: Normocephalic.     Mouth/Throat:     Mouth: Mucous membranes are dry.  Eyes:     Extraocular Movements: Extraocular movements intact.  Cardiovascular:     Rate and Rhythm: Normal rate and regular rhythm.     Pulses: Normal pulses.     Heart sounds: Normal heart sounds.  Abdominal:     General: Bowel sounds are normal. There is no distension.     Palpations: There is mass.     Tenderness: There is no abdominal tenderness.  Musculoskeletal:     Cervical back: Normal range of motion and neck supple.  Skin:    General: Skin is dry.  Neurological:     Mental Status: She is alert and oriented to person, place, and time.  Psychiatric:        Mood and Affect: Mood normal.        Behavior: Behavior normal.        Thought Content: Thought content normal.        Judgment: Judgment normal.   Assessment/Plan: )  Rectal bleeding with anemia-I suspect the patient is having undergone diverticular bleed.  She had a recent EGD that was normal and a colonoscopy that showed sigmoid diverticulosis.  To complete her work-up a capsule study has been ordered for tomorrow. Further recommendation made once that is been done. 2) Chronic constipation on MiraLAX. 3) GERD on Protonix. 4) ?Sigmoid diverticulosis on CT scan done on admission-on Zosyn. 5) Severe hepatic steatosis/obesity. 6) AODM with glucose levels of 563 on admission; on sliding scale coverage with insulin. 7) Neurosarcoidosis ?seizure disorder. 8) Hypertension/OSA. 9) Degenerative disc disease/arthritis  Juanita Craver 08/20/2020, 3:50 PM

## 2020-08-20 NOTE — ED Notes (Signed)
Pt cleaned up with full linen change. Purewick placed

## 2020-08-20 NOTE — ED Notes (Signed)
Report given to Pam Drown, RN of [phone]

## 2020-08-20 NOTE — Progress Notes (Signed)
Patient refused CPAP for the night  

## 2020-08-20 NOTE — Progress Notes (Signed)
VAST consulted to obtain IV access. Upon arrival at bedside, patient's nurse verbalized patient now has an [place]. No further access needed at this time.

## 2020-08-21 ENCOUNTER — Encounter (HOSPITAL_COMMUNITY)
Admission: EM | Disposition: A | Discharge status: Home / Self Care | Payer: Self-pay | Source: Home / Self Care | Attending: Emergency Medicine

## 2020-08-21 DIAGNOSIS — K922 Gastrointestinal hemorrhage, unspecified: Secondary | ICD-10-CM

## 2020-08-21 HISTORY — PX: GIVENS CAPSULE STUDY: SHX5432

## 2020-08-21 LAB — GLUCOSE, CAPILLARY
Glucose-Capillary: 293 mg/dL — ABNORMAL HIGH (ref 70–99)
Glucose-Capillary: 334 mg/dL — ABNORMAL HIGH (ref 70–99)
Glucose-Capillary: 184 mg/dL — ABNORMAL HIGH (ref 70–99)
Glucose-Capillary: 162 mg/dL — ABNORMAL HIGH (ref 70–99)

## 2020-08-21 LAB — BASIC METABOLIC PANEL
Anion gap: 8 (ref 5–15)
BUN: 7 mg/dL (ref 6–20)
CO2: 30 mmol/L (ref 22–32)
Calcium: 8.6 mg/dL — ABNORMAL LOW (ref 8.9–10.3)
Chloride: 94 mmol/L — ABNORMAL LOW (ref 98–111)
Creatinine, Ser: 0.52 mg/dL (ref 0.44–1.00)
GFR, Estimated: >60 mL/min (ref >60)
Glucose, Bld: 241 mg/dL — ABNORMAL HIGH (ref 70–99)
Potassium: 4.2 mmol/L (ref 3.5–5.1)
Sodium: 132 mmol/L — ABNORMAL LOW (ref 135–145)

## 2020-08-21 LAB — CBC
HCT: 30.2 % — ABNORMAL LOW (ref 36.0–46.0)
HCT: 27.9 % — ABNORMAL LOW (ref 36.0–46.0)
Hemoglobin: 9.6 g/dL — ABNORMAL LOW (ref 12.0–15.0)
Hemoglobin: 8.9 g/dL — ABNORMAL LOW (ref 12.0–15.0)
MCH: 31.1 pg (ref 26.0–34.0)
MCH: 30.9 pg (ref 26.0–34.0)
MCHC: 31.8 g/dL (ref 30.0–36.0)
MCHC: 31.9 g/dL (ref 30.0–36.0)
MCV: 97.7 fL (ref 80.0–100.0)
MCV: 96.9 fL (ref 80.0–100.0)
Platelets: 185 10*3/uL (ref 150–400)
Platelets: 202 10*3/uL (ref 150–400)
RBC: 3.09 MIL/uL — ABNORMAL LOW (ref 3.87–5.11)
RBC: 2.88 MIL/uL — ABNORMAL LOW (ref 3.87–5.11)
RDW: 18.7 % — ABNORMAL HIGH (ref 11.5–15.5)
RDW: 18.6 % — ABNORMAL HIGH (ref 11.5–15.5)
WBC: 8.2 10*3/uL (ref 4.0–10.5)
WBC: 8.8 10*3/uL (ref 4.0–10.5)
nRBC: 1.7 % — ABNORMAL HIGH (ref 0.0–0.2)
nRBC: 1.6 % — ABNORMAL HIGH (ref 0.0–0.2)

## 2020-08-21 SURGERY — IMAGING PROCEDURE, GI TRACT, INTRALUMINAL, VIA CAPSULE

## 2020-08-21 MED ORDER — POLYVINYL ALCOHOL 1.4 % OP SOLN
1.0000 [drp] | OPHTHALMIC | Status: DC | PRN
  Filled 2020-08-21: qty 15

## 2020-08-21 MED ORDER — INSULIN GLARGINE 100 UNIT/ML ~~LOC~~ SOLN
25.0000 [IU] | Freq: Every day | SUBCUTANEOUS | Status: DC
  Administered 2020-08-22: 25 [IU] via SUBCUTANEOUS
  Filled 2020-08-21: qty 0.25

## 2020-08-21 MED ORDER — INSULIN ASPART 100 UNIT/ML ~~LOC~~ SOLN
3.0000 [IU] | Freq: Three times a day (TID) | SUBCUTANEOUS | Status: DC
  Administered 2020-08-21 – 2020-08-22 (×2): 3 [IU] via SUBCUTANEOUS

## 2020-08-21 SURGICAL SUPPLY — 1 items: TOWEL COTTON PACK 4EA (MISCELLANEOUS) ×4 IMPLANT

## 2020-08-21 NOTE — Progress Notes (Signed)
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  Reported decreased stool with fresh blood under the stool.  No report of abdominal pain.  On presentation she was hemodynamically stable.  Hemoglobin stable in the range of 9.  GI consulted.  Underwent capsule endoscopy today.  Assessment & Plan:   Principal Problem:   Acute lower GI bleeding Active Problems:   Essential hypertension   OSA (obstructive sleep apnea)   HLD (hyperlipidemia)   Neurosarcoidosis   Type 2 diabetes mellitus without complication, with long-term current use of insulin (HCC)   Recurrent lower GI bleed: Presented with hematochezia.  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  On presentation she was hemodynamically stable. GI consulted and following.  Prior EGD/colonoscopy on 07/24/2020 was negative for any acute bleeding.  Underwent capsule endoscopy today.  Results will be evaluated by GI tomorrow.  Currently hemoglobin stable in the range of 9. Takes aspirin at home which is on hold.  Continue p.o. Protonix.  Currently on clear liquid diet  Neurosarcoidosis: Takes prednisone 40 mg twice a day.  Ambulates with the help of wheelchair.  Also on Neurontin.  Diabetes type 2: A1c of 11.8 as per 07/23/2020.  Continue Lantus and sliding scale insulin.  Monitor blood sugars  Hypertension: Continue Lopressor.  Monitor blood pressure  Hyperlipidemia: On Lipitor  OSA: On CPAP  History of constipation: On MiraLAX at home.  Recent Incidental CT findings -Suspected cyst or  hemangioma noted near falciform ligament with outpatient [place] recommended -Intramuscular growth involving left gluteus medius noted, likely lipoma, close follow-up advised         DVT prophylaxis: SCD Code Status: Full code Family Communication: Husband present at the bedside Status is: Observation  The patient remains OBS appropriate and will d/c before 2 midnights.  Dispo: The patient is from: Home              Anticipated d/c is to: Home              Patient currently is not medically stable to d/c.   Difficult to place patient No     Consultants: GI  Procedures: Video capsule endoscopy  Antimicrobials:  Anti-infectives (From admission, onward)   Start     Dose/Rate Route Frequency Ordered Stop   08/20/20 1500  fluconazole (DIFLUCAN) IVPB 100 mg  Status:  Discontinued        100 mg 50 mL/hr over 60 Minutes Intravenous Every 24 hours 08/20/20 1409 08/21/20 1425      Subjective: Patient seen and examined at the bedside this afternoon.  Husband at the bedside.  Hemodynamically stable.  Comfortable.  Denies any complaints.  No report of hematochezia or melena after admission.  Denies any abdominal pain.  Objective: Vitals:   08/21/20 0000 08/21/20 0400 08/21/20 0802 08/21/20 1530  BP: (!) 144/88 (!) 150/93 131/87 121/81  Pulse: 74 86 85 75  Resp: 16 16 16 16   Temp: 98.6 F (37 C) 98.6 F (37 C) 98.4 F (36.9 C) 98.4 F (36.9 C)  TempSrc: Oral Oral Oral Oral  SpO2: 100% 100% 99% 100%  Intake/Output Summary (Last 24 hours) at 08/21/2020 1554 Last data filed at 08/21/2020 1545 Gross per 24 hour  Intake 1247.19 ml  Output 2900 ml  Net -1652.81 ml   There were no vitals filed for this visit.  Examination:  General exam: Overall comfortable, not in distress,obese HEENT: PERRL Respiratory system:  no wheezes or crackles  Cardiovascular system: S1 & S2 heard, RRR.  Gastrointestinal system: Abdomen is nondistended, soft and nontender. Central nervous system:  Alert and oriented Extremities: No edema, no clubbing ,no cyanosis Skin: No rashes, no ulcers,no icterus      Data Reviewed: I have personally reviewed following labs and imaging studies  CBC: Recent Labs  Lab 08/20/20 0910 08/20/20 1818 08/21/20 0408  WBC 9.9 7.6 8.2  NEUTROABS 7.6  --   --   HGB 9.6* 9.2* 9.6*  HCT 31.2* 28.7* 30.2*  MCV 101.3* 96.0 97.7  PLT 187 203 500   Basic Metabolic Panel: Recent Labs  Lab 08/20/20 0910 08/21/20 0408  NA 130* 132*  K 4.3 4.2  CL 93* 94*  CO2 25 30  GLUCOSE 385* 241*  BUN 8 7  CREATININE 0.67 0.52  CALCIUM 8.7* 8.6*   GFR: CrCl cannot be calculated (Unknown ideal weight.). Liver Function Tests: No results for input(s): AST, ALT, ALKPHOS, BILITOT, PROT, ALBUMIN in the last 168 hours. No results for input(s): LIPASE, AMYLASE in the last 168 hours. No results for input(s): AMMONIA in the last 168 hours. Coagulation Profile: No results for input(s): INR, PROTIME in the last 168 hours. Cardiac Enzymes: No results for input(s): CKTOTAL, CKMB, CKMBINDEX, TROPONINI in the last 168 hours. BNP (last 3 results) No results for input(s): PROBNP in the last 8760 hours. HbA1C: No results for input(s): HGBA1C in the last 72 hours. CBG: Recent Labs  Lab 08/20/20 1617 08/20/20 2055 08/21/20 0626 08/21/20 1218  GLUCAP 376* 363* 293* 334*   Lipid Profile: No results for input(s): CHOL, HDL, LDLCALC, TRIG, CHOLHDL, LDLDIRECT in the last 72 hours. Thyroid Function Tests: No results for input(s): TSH, T4TOTAL, FREET4, T3FREE, THYROIDAB in the last 72 hours. Anemia Panel: No results for input(s): VITAMINB12, FOLATE, FERRITIN, TIBC, IRON, RETICCTPCT in the last 72 hours. Sepsis Labs: No results for input(s): PROCALCITON, LATICACIDVEN in the last 168 hours.  No results found for this or any previous visit (from the past 240 hour(s)).       Radiology Studies: No results found.      Scheduled Meds: . atorvastatin  80 mg  Oral QHS  . gabapentin  100 mg Oral TID  . insulin aspart  0-15 Units Subcutaneous TID WC  . insulin aspart  0-5 Units Subcutaneous QHS  . [START ON 08/22/2020] insulin glargine  25 Units Subcutaneous Daily  . melatonin  3 mg Oral QHS  . metoprolol tartrate  25 mg Oral BID  . pantoprazole  40 mg Oral Daily  . predniSONE  40 mg Oral BID WC   Continuous Infusions: . lactated ringers 100 mL/hr at 08/21/20 0330     LOS: 0 days    Time spent:25 mins. More than 50% of that time was spent in counseling and/or coordination of care.      Shelly Coss, MD [facility] P4/09/2020, 3:54 PM

## 2020-08-21 NOTE — Progress Notes (Signed)
Placed patient on CPAP for the night via auto-mode.  

## 2020-08-21 NOTE — Discharge Summary (Addendum)
Physician Discharge Summary  RASHAUNDA RAHL TDV:[id number] DOB: 11/15/1960 DOA: 08/20/2020  PCP: Bartholome Bill, MD  Admit date: 08/20/2020 Discharge date: 08/22/2020  Admitted From: Home Disposition:  Home  Discharge Condition:Stable CODE STATUS:FULL Diet recommendation: Heart Healthy   Brief/Interim Summary: Patient is a 60 year old female with history of hypertension, obesity, OSA, neuro sarcoidosis, wheelchair-bound, diabetes type 2, seizure disorder, recently admitted for hemorrhagic shock in the setting of acute lower GI bleed from diverticular disease presents with hematochezia.  Reported decreased stool with fresh blood under the stool.  No report of abdominal pain.  On presentation she was hemodynamically stable.  Hemoglobin stable in the range of 9.  GI consulted.  Underwent capsule endoscopy with finding of erythema in the small bowel, esophageal candidiasis.  GI cleared her for discharge today.  Following problems were addressed during her hospitalization:  Recurrent lower GI bleed: Presented with hematochezia.  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  On presentation she was hemodynamically stable. GI consulted and following.  Prior EGD/colonoscopy on 07/24/2020 was negative for any acute bleeding.  Underwent capsule endoscopy with finding of erythema in the small bowel, esophageal candidiasis. Currently hemoglobin stable in the range of 9.No bowel movement during this hospitalization. Continue p.o. Protonix. Check CBC in a week  Neurosarcoidosis: Takes prednisone 40 mg twice a day.  Ambulates with the help of wheelchair.  Also on Neurontin.  Diabetes type 2: A1c of 11.8 as per 07/23/2020. Continue home regimen and follow up closely with PCP for diabetic management  Hypertension: Continue Lopressor.    Hyperlipidemia: On Lipitor  OSA: On CPAP  History of constipation: On MiraLAX at home.  RecentIncidental CT  findings -Suspected cyst or hemangioma noted near falciform ligament with outpatient [place] recommended -Intramuscular growth involving left gluteus medius noted, likely lipoma, close follow-up advised    Discharge Diagnoses:  Principal Problem:   Acute lower GI bleeding Active Problems:   Essential hypertension   OSA (obstructive sleep apnea)   HLD (hyperlipidemia)   Neurosarcoidosis   Type 2 diabetes mellitus without complication, with long-term current use of insulin (HCC)    Discharge Instructions  Discharge Instructions    Diet - low sodium heart healthy   Complete by: As directed    Discharge instructions   Complete by: As directed    1)Please take prescribed medication as an outpatient. 2)Follow up with your PCP in a week.Do a CBC test during the follow up   Increase activity slowly   Complete by: As directed      Allergies as of 08/22/2020      Reactions   Aspirin Nausea And Vomiting   Augmentin [amoxicillin-pot Clavulanate] Swelling, Other (See Comments)   Hand swelled at IV site      Medication List    TAKE these medications   aspirin 81 MG EC tablet Take 81 mg by mouth daily.   atorvastatin 80 MG tablet Commonly known as: LIPITOR Take 1 tablet (80 mg total) by mouth daily at 6 PM.   b complex vitamins tablet Take 1 tablet by mouth daily.   blood glucose meter kit and supplies Kit Dispense based on patient and insurance preference. Use up to four times daily as directed.   CALCIUM+D3 PO Take 1 tablet by mouth daily with lunch.   FeroSul 325 (65 FE) MG tablet Generic drug: ferrous sulfate Take 325 mg by mouth 2 (two) times daily.   Fish Oil 1000 MG Caps  Take 1,000 mg by mouth daily with lunch.   fluconazole 100 MG tablet Commonly known as: Diflucan Take 1 tablet (100 mg total) by mouth daily for 10 days.   gabapentin 100 MG capsule Commonly known as: NEURONTIN Take 100 mg by mouth 3 (three) times daily.   glucosamine-chondroitin 500-400  MG tablet Take 1 tablet by mouth 3 (three) times daily. What changed: when to take this   insulin glargine 100 UNIT/ML Solostar Pen Commonly known as: LANTUS Inject 18 Units into the skin daily.   Insulin Pen Needle 29G X 5MM Misc USE WITH LANTUS   melatonin 3 MG Tabs tablet Take 1 tablet (3 mg total) by mouth at bedtime.   metoprolol tartrate 25 MG tablet Commonly known as: LOPRESSOR Take 1 tablet (25 mg total) by mouth 2 (two) times daily.   multivitamin with minerals Tabs tablet Take 1 tablet by mouth daily with lunch.   pantoprazole 40 MG tablet Commonly known as: PROTONIX Take 1 tablet (40 mg total) by mouth daily.   polyethylene glycol 17 g packet Commonly known as: MIRALAX / GLYCOLAX Take 17 g by mouth 2 (two) times daily. What changed:   when to take this  reasons to take this   predniSONE 20 MG tablet Commonly known as: DELTASONE Take 40 mg by mouth in the morning and at bedtime.   PRESCRIPTION MEDICATION Inhale into the lungs See admin instructions. CPAP- At bedtime       Follow-up Information    Bartholome Bill, MD. Schedule an appointment as soon as possible for a visit in 1 week(s).   Specialty: Family Medicine Contact information: [place] [address] [phone]              Allergies  Allergen Reactions  . Aspirin Nausea And Vomiting  . Augmentin [Amoxicillin-Pot Clavulanate] Swelling and Other (See Comments)    Hand swelled at IV site    Consultations:  GI   Procedures/Studies: No results found.    Subjective: Patient seen and examined the bedside this morning.  Hemodynamically stable for discharge.  Discharge Exam: Vitals:   08/22/20 0745 08/22/20 1516  BP: (!) 141/80 115/72  Pulse: 78 80  Resp: 15 17  Temp: (!) 97.3 F (36.3 C) 98.4 F (36.9 C)  SpO2: 100% 100%   Vitals:   08/21/20 2303 08/22/20 0432 08/22/20 0745 08/22/20 1516  BP:  (!) 141/85 (!) 141/80 115/72   Pulse: 79 76 78 80  Resp: $Remo'18 16 15 17  'PHPHT$ Temp:  98.9 F (37.2 C) (!) 97.3 F (36.3 C) 98.4 F (36.9 C)  TempSrc:  Oral Oral Oral  SpO2: 96% 100% 100% 100%    General: Pt is alert, awake, not in acute distress Cardiovascular: RRR, S1/S2 +, no rubs, no gallops Respiratory: CTA bilaterally, no wheezing, no rhonchi Abdominal: Soft, NT, ND, bowel sounds + Extremities: no edema, no cyanosis    The results of significant diagnostics from this hospitalization (including imaging, microbiology, ancillary and laboratory) are listed below for reference.     Microbiology: No results found for this or any previous visit (from the past 240 hour(s)).   Labs: BNP (last 3 results) Recent Labs    06/18/20 0006  BNP 60.1   Basic Metabolic Panel: Recent Labs  Lab 08/20/20 0910 08/21/20 0408  NA 130* 132*  K 4.3 4.2  CL 93* 94*  CO2 25 30  GLUCOSE 385* 241*  BUN 8 7  CREATININE 0.67 0.52  CALCIUM 8.7* 8.6*   Liver Function Tests: No results for input(s): AST, ALT, ALKPHOS, BILITOT, PROT, ALBUMIN in the last 168 hours. No results for input(s): LIPASE, AMYLASE in the last 168 hours. No results for input(s): AMMONIA in the last 168 hours. CBC: Recent Labs  Lab 08/20/20 0910 08/20/20 1818 08/21/20 0408 08/21/20 1653 08/22/20 0626  WBC 9.9 7.6 8.2 8.8 9.1  NEUTROABS 7.6  --   --   --   --   HGB 9.6* 9.2* 9.6* 8.9* 9.9*  HCT 31.2* 28.7* 30.2* 27.9* 31.3*  MCV 101.3* 96.0 97.7 96.9 95.7  PLT 187 203 185 202 209   Cardiac Enzymes: No results for input(s): CKTOTAL, CKMB, CKMBINDEX, TROPONINI in the last 168 hours. BNP: Invalid input(s): POCBNP CBG: Recent Labs  Lab 08/21/20 1218 08/21/20 1616 08/21/20 1934 08/22/20 0625 08/22/20 1144  GLUCAP 334* 184* 162* 186* 311*   D-Dimer No results for input(s): DDIMER in the last 72 hours. Hgb A1c No results for input(s): HGBA1C in the last 72 hours. Lipid Profile No results for input(s): CHOL, HDL, LDLCALC, TRIG, CHOLHDL,  LDLDIRECT in the last 72 hours. Thyroid function studies No results for input(s): TSH, T4TOTAL, T3FREE, THYROIDAB in the last 72 hours.  Invalid input(s): FREET3 Anemia work up No results for input(s): VITAMINB12, FOLATE, FERRITIN, TIBC, IRON, RETICCTPCT in the last 72 hours. Urinalysis    Component Value Date/Time   COLORURINE STRAW (A) 07/23/2020 0731   APPEARANCEUR CLEAR 07/23/2020 0731   APPEARANCEUR Clear 02/10/2018 1639   LABSPEC 1.025 07/23/2020 0731   PHURINE 5.0 07/23/2020 0731   GLUCOSEU >=500 (A) 07/23/2020 0731   HGBUR SMALL (A) 07/23/2020 0731   BILIRUBINUR NEGATIVE 07/23/2020 0731   BILIRUBINUR Negative 02/10/2018 1639   KETONESUR 20 (A) 07/23/2020 0731   PROTEINUR NEGATIVE 07/23/2020 0731   NITRITE NEGATIVE 07/23/2020 0731   LEUKOCYTESUR LARGE (A) 07/23/2020 0731   Sepsis Labs Invalid input(s): PROCALCITONIN,  WBC,  LACTICIDVEN Microbiology No results found for this or any previous visit (from the past 240 hour(s)).  Please note: You were cared for by a hospitalist during your hospital stay. Once you are discharged, your primary care physician will handle any further medical issues. Please note that NO REFILLS for any discharge medications will be authorized once you are discharged, as it is imperative that you return to your primary care physician (or establish a relationship with a primary care physician if you do not have one) for your post hospital discharge needs so that they can reassess your need for medications and monitor your lab values.    Time coordinating discharge: 40 minutes  SIGNED:   Shelly Coss, MD  [facility] 08/22/2020, 4:19 PM Pager [phone]  If 7PM-7AM, please contact night-coverage [email or web address] Password [email or web address]

## 2020-08-21 NOTE — Progress Notes (Signed)
Pt swallowed pill capsule easily, verbalized understanding of instructions. Instructions also given to patients nurse. To be taken off at 1900.

## 2020-08-22 ENCOUNTER — Encounter (HOSPITAL_COMMUNITY): Payer: Self-pay | Admitting: Gastroenterology

## 2020-08-22 LAB — GLUCOSE, CAPILLARY
Glucose-Capillary: 186 mg/dL — ABNORMAL HIGH (ref 70–99)
Glucose-Capillary: 311 mg/dL — ABNORMAL HIGH (ref 70–99)
Glucose-Capillary: 300 mg/dL — ABNORMAL HIGH (ref 70–99)

## 2020-08-22 LAB — CBC
HCT: 31.3 % — ABNORMAL LOW (ref 36.0–46.0)
Hemoglobin: 9.9 g/dL — ABNORMAL LOW (ref 12.0–15.0)
MCH: 30.3 pg (ref 26.0–34.0)
MCHC: 31.6 g/dL (ref 30.0–36.0)
MCV: 95.7 fL (ref 80.0–100.0)
Platelets: 209 10*3/uL (ref 150–400)
RBC: 3.27 MIL/uL — ABNORMAL LOW (ref 3.87–5.11)
RDW: 18.7 % — ABNORMAL HIGH (ref 11.5–15.5)
WBC: 9.1 10*3/uL (ref 4.0–10.5)
nRBC: 1.8 % — ABNORMAL HIGH (ref 0.0–0.2)

## 2020-08-22 MED ORDER — FLUCONAZOLE 100 MG PO TABS: 100.0000 mg | ORAL_TABLET | Freq: Every day | ORAL | 0 refills | Status: DC

## 2020-08-22 NOTE — Progress Notes (Signed)
Pt sat at the edge of the bed. Discharge instructions given to pt and spouse. Discharged to home accompanied by spouse.

## 2020-08-29 ENCOUNTER — Emergency Department (HOSPITAL_COMMUNITY): Payer: 59

## 2020-08-29 ENCOUNTER — Other Ambulatory Visit: Payer: Self-pay

## 2020-08-29 ENCOUNTER — Inpatient Hospital Stay (HOSPITAL_COMMUNITY)
Admission: EM | Admit: 2020-08-29 | Discharge: 2020-09-28 | DRG: 329 | Disposition: A | Discharge status: Skilled Nursing Facility | Payer: 59 | Attending: Internal Medicine | Admitting: Internal Medicine

## 2020-08-29 ENCOUNTER — Encounter (HOSPITAL_COMMUNITY): Payer: Self-pay | Admitting: Emergency Medicine

## 2020-08-29 DIAGNOSIS — K922 Gastrointestinal hemorrhage, unspecified: Secondary | ICD-10-CM | POA: Diagnosis not present

## 2020-08-29 DIAGNOSIS — K5791 Diverticulosis of intestine, part unspecified, without perforation or abscess with bleeding: Secondary | ICD-10-CM | POA: Diagnosis not present

## 2020-08-29 DIAGNOSIS — Z90710 Acquired absence of both cervix and uterus: Secondary | ICD-10-CM

## 2020-08-29 DIAGNOSIS — K31811 Angiodysplasia of stomach and duodenum with bleeding: Secondary | ICD-10-CM | POA: Diagnosis not present

## 2020-08-29 DIAGNOSIS — R918 Other nonspecific abnormal finding of lung field: Secondary | ICD-10-CM | POA: Diagnosis present

## 2020-08-29 DIAGNOSIS — R5381 Other malaise: Secondary | ICD-10-CM | POA: Diagnosis present

## 2020-08-29 DIAGNOSIS — I2699 Other pulmonary embolism without acute cor pulmonale: Secondary | ICD-10-CM | POA: Diagnosis not present

## 2020-08-29 DIAGNOSIS — F419 Anxiety disorder, unspecified: Secondary | ICD-10-CM | POA: Diagnosis present

## 2020-08-29 DIAGNOSIS — R410 Disorientation, unspecified: Secondary | ICD-10-CM | POA: Diagnosis not present

## 2020-08-29 DIAGNOSIS — R110 Nausea: Secondary | ICD-10-CM

## 2020-08-29 DIAGNOSIS — Z79899 Other long term (current) drug therapy: Secondary | ICD-10-CM

## 2020-08-29 DIAGNOSIS — Z0189 Encounter for other specified special examinations: Secondary | ICD-10-CM

## 2020-08-29 DIAGNOSIS — Z8249 Family history of ischemic heart disease and other diseases of the circulatory system: Secondary | ICD-10-CM | POA: Diagnosis not present

## 2020-08-29 DIAGNOSIS — R578 Other shock: Secondary | ICD-10-CM | POA: Diagnosis present

## 2020-08-29 DIAGNOSIS — K9189 Other postprocedural complications and disorders of digestive system: Secondary | ICD-10-CM | POA: Diagnosis not present

## 2020-08-29 DIAGNOSIS — I959 Hypotension, unspecified: Secondary | ICD-10-CM | POA: Diagnosis not present

## 2020-08-29 DIAGNOSIS — Z7982 Long term (current) use of aspirin: Secondary | ICD-10-CM | POA: Diagnosis not present

## 2020-08-29 DIAGNOSIS — K567 Ileus, unspecified: Secondary | ICD-10-CM | POA: Diagnosis not present

## 2020-08-29 DIAGNOSIS — D62 Acute posthemorrhagic anemia: Secondary | ICD-10-CM | POA: Diagnosis present

## 2020-08-29 DIAGNOSIS — K658 Other peritonitis: Secondary | ICD-10-CM | POA: Diagnosis present

## 2020-08-29 DIAGNOSIS — E1152 Type 2 diabetes mellitus with diabetic peripheral angiopathy with gangrene: Secondary | ICD-10-CM | POA: Diagnosis present

## 2020-08-29 DIAGNOSIS — I9589 Other hypotension: Secondary | ICD-10-CM

## 2020-08-29 DIAGNOSIS — K625 Hemorrhage of anus and rectum: Secondary | ICD-10-CM | POA: Diagnosis present

## 2020-08-29 DIAGNOSIS — N179 Acute kidney failure, unspecified: Secondary | ICD-10-CM | POA: Diagnosis present

## 2020-08-29 DIAGNOSIS — Z20822 Contact with and (suspected) exposure to covid-19: Secondary | ICD-10-CM | POA: Diagnosis present

## 2020-08-29 DIAGNOSIS — Z79890 Hormone replacement therapy (postmenopausal): Secondary | ICD-10-CM | POA: Diagnosis not present

## 2020-08-29 DIAGNOSIS — B3781 Candidal esophagitis: Secondary | ICD-10-CM | POA: Diagnosis present

## 2020-08-29 DIAGNOSIS — K5721 Diverticulitis of large intestine with perforation and abscess with bleeding: Principal | ICD-10-CM | POA: Diagnosis present

## 2020-08-29 DIAGNOSIS — G4733 Obstructive sleep apnea (adult) (pediatric): Secondary | ICD-10-CM | POA: Diagnosis present

## 2020-08-29 DIAGNOSIS — E871 Hypo-osmolality and hyponatremia: Secondary | ICD-10-CM | POA: Diagnosis present

## 2020-08-29 DIAGNOSIS — E878 Other disorders of electrolyte and fluid balance, not elsewhere classified: Secondary | ICD-10-CM | POA: Diagnosis not present

## 2020-08-29 DIAGNOSIS — E249 Cushing's syndrome, unspecified: Secondary | ICD-10-CM | POA: Diagnosis present

## 2020-08-29 DIAGNOSIS — E861 Hypovolemia: Secondary | ICD-10-CM | POA: Diagnosis not present

## 2020-08-29 DIAGNOSIS — K631 Perforation of intestine (nontraumatic): Secondary | ICD-10-CM | POA: Diagnosis not present

## 2020-08-29 DIAGNOSIS — Y838 Other surgical procedures as the cause of abnormal reaction of the patient, or of later complication, without mention of misadventure at the time of the procedure: Secondary | ICD-10-CM | POA: Diagnosis not present

## 2020-08-29 DIAGNOSIS — K559 Vascular disorder of intestine, unspecified: Secondary | ICD-10-CM | POA: Diagnosis not present

## 2020-08-29 DIAGNOSIS — E876 Hypokalemia: Secondary | ICD-10-CM | POA: Diagnosis not present

## 2020-08-29 DIAGNOSIS — E669 Obesity, unspecified: Secondary | ICD-10-CM | POA: Diagnosis present

## 2020-08-29 DIAGNOSIS — Z794 Long term (current) use of insulin: Secondary | ICD-10-CM | POA: Diagnosis not present

## 2020-08-29 DIAGNOSIS — E785 Hyperlipidemia, unspecified: Secondary | ICD-10-CM | POA: Diagnosis present

## 2020-08-29 DIAGNOSIS — Z9119 Patient's noncompliance with other medical treatment and regimen: Secondary | ICD-10-CM

## 2020-08-29 DIAGNOSIS — Z886 Allergy status to analgesic agent status: Secondary | ICD-10-CM | POA: Diagnosis not present

## 2020-08-29 DIAGNOSIS — Z7952 Long term (current) use of systemic steroids: Secondary | ICD-10-CM | POA: Diagnosis not present

## 2020-08-29 DIAGNOSIS — D8689 Sarcoidosis of other sites: Secondary | ICD-10-CM | POA: Diagnosis not present

## 2020-08-29 DIAGNOSIS — K6389 Other specified diseases of intestine: Secondary | ICD-10-CM | POA: Diagnosis not present

## 2020-08-29 DIAGNOSIS — Z993 Dependence on wheelchair: Secondary | ICD-10-CM

## 2020-08-29 DIAGNOSIS — R112 Nausea with vomiting, unspecified: Secondary | ICD-10-CM | POA: Diagnosis not present

## 2020-08-29 DIAGNOSIS — Z789 Other specified health status: Secondary | ICD-10-CM | POA: Diagnosis not present

## 2020-08-29 DIAGNOSIS — Z833 Family history of diabetes mellitus: Secondary | ICD-10-CM

## 2020-08-29 DIAGNOSIS — Z881 Allergy status to other antibiotic agents status: Secondary | ICD-10-CM

## 2020-08-29 DIAGNOSIS — I10 Essential (primary) hypertension: Secondary | ICD-10-CM | POA: Diagnosis present

## 2020-08-29 DIAGNOSIS — R609 Edema, unspecified: Secondary | ICD-10-CM | POA: Diagnosis not present

## 2020-08-29 DIAGNOSIS — Z4659 Encounter for fitting and adjustment of other gastrointestinal appliance and device: Secondary | ICD-10-CM

## 2020-08-29 DIAGNOSIS — R000 Tachycardia, unspecified: Secondary | ICD-10-CM | POA: Diagnosis not present

## 2020-08-29 DIAGNOSIS — Z6837 Body mass index (BMI) 37.0-37.9, adult: Secondary | ICD-10-CM

## 2020-08-29 DIAGNOSIS — E1165 Type 2 diabetes mellitus with hyperglycemia: Secondary | ICD-10-CM | POA: Diagnosis not present

## 2020-08-29 DIAGNOSIS — K921 Melena: Secondary | ICD-10-CM | POA: Diagnosis not present

## 2020-08-29 HISTORY — PX: IR ANGIOGRAM SELECTIVE EACH ADDITIONAL VESSEL: IMG667

## 2020-08-29 HISTORY — PX: IR EMBO ART  VEN HEMORR LYMPH EXTRAV  INC GUIDE ROADMAPPING: IMG5450

## 2020-08-29 HISTORY — PX: IR ANGIOGRAM FOLLOW UP STUDY: IMG697

## 2020-08-29 HISTORY — PX: IR US GUIDE VASC ACCESS RIGHT: IMG2390

## 2020-08-29 HISTORY — PX: IR ANGIOGRAM VISCERAL SELECTIVE: IMG657

## 2020-08-29 LAB — PREPARE RBC (CROSSMATCH)

## 2020-08-29 LAB — COMPREHENSIVE METABOLIC PANEL
ALT: <5 U/L (ref 0–44)
AST: 37 U/L (ref 15–41)
Albumin: 2.6 g/dL — ABNORMAL LOW (ref 3.5–5.0)
Alkaline Phosphatase: 137 U/L — ABNORMAL HIGH (ref 38–126)
Anion gap: 12 (ref 5–15)
BUN: 10 mg/dL (ref 6–20)
CO2: 23 mmol/L (ref 22–32)
Calcium: 8.1 mg/dL — ABNORMAL LOW (ref 8.9–10.3)
Chloride: 89 mmol/L — ABNORMAL LOW (ref 98–111)
Creatinine, Ser: 1.04 mg/dL — ABNORMAL HIGH (ref 0.44–1.00)
GFR, Estimated: >60 mL/min (ref >60)
Glucose, Bld: 616 mg/dL (ref 70–99)
Potassium: 4.8 mmol/L (ref 3.5–5.1)
Sodium: 124 mmol/L — ABNORMAL LOW (ref 135–145)
Total Bilirubin: 0.7 mg/dL (ref 0.3–1.2)
Total Protein: 4.5 g/dL — ABNORMAL LOW (ref 6.5–8.1)

## 2020-08-29 LAB — RETICULOCYTES
Immature Retic Fract: 40.8 % — ABNORMAL HIGH (ref 2.3–15.9)
RBC.: 2.38 MIL/uL — ABNORMAL LOW (ref 3.87–5.11)
Retic Count, Absolute: 121.6 10*3/uL (ref 19.0–186.0)
Retic Ct Pct: 5.1 % — ABNORMAL HIGH (ref 0.4–3.1)

## 2020-08-29 LAB — CBC WITH DIFFERENTIAL/PLATELET
Abs Immature Granulocytes: 0.4 10*3/uL — ABNORMAL HIGH (ref 0.00–0.07)
Basophils Absolute: 0 10*3/uL (ref 0.0–0.1)
Basophils Relative: 0 %
Eosinophils Absolute: 0 10*3/uL (ref 0.0–0.5)
Eosinophils Relative: 0 %
HCT: 26.3 % — ABNORMAL LOW (ref 36.0–46.0)
Hemoglobin: 8.1 g/dL — ABNORMAL LOW (ref 12.0–15.0)
Immature Granulocytes: 5 %
Lymphocytes Relative: 7 %
Lymphs Abs: 0.6 10*3/uL — ABNORMAL LOW (ref 0.7–4.0)
MCH: 31.3 pg (ref 26.0–34.0)
MCHC: 30.8 g/dL (ref 30.0–36.0)
MCV: 101.5 fL — ABNORMAL HIGH (ref 80.0–100.0)
Monocytes Absolute: 0.3 10*3/uL (ref 0.1–1.0)
Monocytes Relative: 4 %
Neutro Abs: 7.6 10*3/uL (ref 1.7–7.7)
Neutrophils Relative %: 84 %
Platelets: 216 10*3/uL (ref 150–400)
RBC: 2.59 MIL/uL — ABNORMAL LOW (ref 3.87–5.11)
RDW: 19 % — ABNORMAL HIGH (ref 11.5–15.5)
WBC: 9 10*3/uL (ref 4.0–10.5)
nRBC: 2.4 % — ABNORMAL HIGH (ref 0.0–0.2)

## 2020-08-29 LAB — VITAMIN B12: Vitamin B-12: 2853 pg/mL — ABNORMAL HIGH (ref 180–914)

## 2020-08-29 LAB — SAMPLE TO BLOOD BANK

## 2020-08-29 LAB — RESP PANEL BY RT-PCR (FLU A&B, COVID) ARPGX2
Influenza A by PCR: NEGATIVE
Influenza B by PCR: NEGATIVE
SARS Coronavirus 2 by RT PCR: NEGATIVE

## 2020-08-29 LAB — IRON AND TIBC
Iron: 72 ug/dL (ref 28–170)
Saturation Ratios: 33 % — ABNORMAL HIGH (ref 10.4–31.8)
TIBC: 217 ug/dL — ABNORMAL LOW (ref 250–450)
UIBC: 145 ug/dL

## 2020-08-29 LAB — POC OCCULT BLOOD, ED: Fecal Occult Bld: POSITIVE — AB

## 2020-08-29 LAB — CBG MONITORING, ED: Glucose-Capillary: 535 mg/dL (ref 70–99)

## 2020-08-29 LAB — FERRITIN: Ferritin: 170 ng/mL (ref 11–307)

## 2020-08-29 LAB — FOLATE: Folate: 31.2 ng/mL (ref >5.9)

## 2020-08-29 LAB — PROTIME-INR
INR: 1.1 (ref 0.8–1.2)
Prothrombin Time: 13.8 seconds (ref 11.4–15.2)

## 2020-08-29 MED ORDER — SODIUM CHLORIDE 0.9 % IV SOLN
INTRAVENOUS | Status: AC | PRN
  Administered 2020-08-29: 10 mL/h via INTRAVENOUS

## 2020-08-29 MED ORDER — IOHEXOL 350 MG/ML SOLN
100.0000 mL | Freq: Once | INTRAVENOUS | Status: AC | PRN
  Administered 2020-08-29: 100 mL via INTRAVENOUS

## 2020-08-29 MED ORDER — DOCUSATE SODIUM 100 MG PO CAPS: 100.0000 mg | ORAL_CAPSULE | Freq: Two times a day (BID) | ORAL | Status: DC | PRN

## 2020-08-29 MED ORDER — FENTANYL CITRATE (PF) 100 MCG/2ML IJ SOLN
INTRAMUSCULAR | Status: AC
  Filled 2020-08-29: qty 2

## 2020-08-29 MED ORDER — ONDANSETRON HCL 4 MG/2ML IJ SOLN
4.0000 mg | Freq: Once | INTRAMUSCULAR | Status: AC
  Administered 2020-08-29: 4 mg via INTRAVENOUS
  Filled 2020-08-29: qty 2

## 2020-08-29 MED ORDER — LACTATED RINGERS IV SOLN: INTRAVENOUS | Status: DC

## 2020-08-29 MED ORDER — MIDAZOLAM HCL 2 MG/2ML IJ SOLN
INTRAMUSCULAR | Status: AC | PRN
  Administered 2020-08-29: 0.5 mg via INTRAVENOUS

## 2020-08-29 MED ORDER — IOHEXOL 300 MG/ML  SOLN
100.0000 mL | Freq: Once | INTRAMUSCULAR | Status: AC | PRN
  Administered 2020-08-29: 65 mL via INTRA_ARTERIAL

## 2020-08-29 MED ORDER — SODIUM CHLORIDE 0.9% IV SOLUTION: Freq: Once | INTRAVENOUS | Status: AC

## 2020-08-29 MED ORDER — PANTOPRAZOLE SODIUM 40 MG IV SOLR
40.0000 mg | Freq: Once | INTRAVENOUS | Status: AC
  Administered 2020-08-29: 40 mg via INTRAVENOUS
  Filled 2020-08-29: qty 40

## 2020-08-29 MED ORDER — LIDOCAINE HCL (PF) 1 % IJ SOLN
INTRAMUSCULAR | Status: AC
  Filled 2020-08-29: qty 30

## 2020-08-29 MED ORDER — IOHEXOL 300 MG/ML  SOLN
100.0000 mL | Freq: Once | INTRAMUSCULAR | Status: AC | PRN
  Administered 2020-08-29: 12 mL via INTRA_ARTERIAL

## 2020-08-29 MED ORDER — POLYETHYLENE GLYCOL 3350 17 G PO PACK: 17.0000 g | PACK | Freq: Every day | ORAL | Status: DC | PRN

## 2020-08-29 MED ORDER — FENTANYL CITRATE (PF) 100 MCG/2ML IJ SOLN
INTRAMUSCULAR | Status: AC | PRN
  Administered 2020-08-29: 25 ug via INTRAVENOUS

## 2020-08-29 MED ORDER — MIDAZOLAM HCL 2 MG/2ML IJ SOLN
INTRAMUSCULAR | Status: AC
  Filled 2020-08-29: qty 2

## 2020-08-29 NOTE — ED Notes (Signed)
Patient becoming increasingly hypotensive, MD Tyrone Nine notified and verbal order for 2 emergency PRBCs ordered and given with K. Munnett, RN.

## 2020-08-29 NOTE — Procedures (Signed)
Pre-procedure Diagnosis: Acute lower GI bleeding Post-procedure Diagnosis: Same  Post mesenteric arteriogram and technically successful percutaneous coil embolization of intraluminal extravasation for a distal arcade branch of the IMA, correlating with the area of extravasation on preceding CTA.    Complications: None Immediate EBL: None  Keep right leg straight for 4 hrs.    Signed: Sandi Mariscal Pager: [phone] 08/29/2020, 11:39 PM

## 2020-08-29 NOTE — ED Provider Notes (Addendum)
[facility] EMERGENCY DEPARTMENT Provider Note   CSN: [id number] Arrival date & time: 08/29/20  1927     History Chief Complaint  Patient presents with  . Rectal Bleeding    Rhonda [name] is a 60 y.o. female.  HPI  Patient with past medical history significant for GI bleeds.  She has had notably to GI bleeds in the past month.  3/6 she was admitted for hemorrhagic shock due to GI bleed from diverticulitis of large intestine with bleeding.  Pain 3/6 hospital admission patient received 2 units of PRBCs with unremarkable EGD colonoscopy 3/10 showed diverticulitis which is thought to be most likely cause of bleed.  She was resumed on aspirin at discharge.  Was readmitted 4/4 for similar.  Had capsule endoscopy for erythema and small bowel and esophageal candidiasis.  Bloody BM 7pm states that she feels fatigued but denies any nausea, vomiting, abdominal pain, lightheadedness dizziness or shortness of breath.  Patient is notably somewhat lethargic appearing but is answering questions.  LEVEL 5 Caveat      Past Medical History:  Diagnosis Date  . Arthritis   . Degenerative disc disease, lumbar   . GERD (gastroesophageal reflux disease)   . Hypertension   . OSA (obstructive sleep apnea)    not currently on CPAP  . Prediabetes   . Sarcoidosis of central nervous system   . Seizures ([place])   . Type 2   . Urinary incontinence     Patient Active Problem List   Diagnosis Date Noted  . GI bleed 08/29/2020  . 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 High risk medication use 08/06/2020  . Weakness 08/06/2020  . Type 2 diabetes mellitus without complication, with long-term current use of insulin ([place]) 08/06/2020  . Acute lower GI bleeding 07/23/2020  . Hyperosmolar hyperglycemic state (HHS) ([place]) 07/23/2020  . Diverticulitis of large intestine with bleeding 07/23/2020  . Liver lesion 07/23/2020  . 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 HLD (hyperlipidemia) 01/12/2020  . Acute lower  UTI 01/12/2020  . AMS (altered mental status) 01/11/2020  . Memory changes 01/08/2020  . Altered mental status   . Hyponatremia   . Speech disturbance   . Demyelinating changes in brain ([place]) 11/13/2019  . Left pontine stroke ([place]) 05/23/2019  . Transaminitis 05/23/2019  . Nausea and vomiting 05/23/2019  . Leukocytosis 05/23/2019  . Thoracic lymphadenopathy 08/26/2018  . Axillary lymphadenopathy 08/26/2018  . Piriformis syndrome of right side 09/29/2016  . OSA (obstructive sleep apnea) 09/27/2015  . Right hip pain 04/11/2015  . Essential hypertension 11/07/2014  . Chronic low back pain 11/07/2014  . Prediabetes 11/07/2014  . Vitamin D deficiency 11/07/2014    Past Surgical History:  Procedure Laterality Date  . ABDOMINAL HYSTERECTOMY    . BREAST EXCISIONAL BIOPSY Right   . BREAST SURGERY     right breast  . CESAREAN SECTION    . COLONOSCOPY WITH PROPOFOL N/A 07/26/2020   Procedure: COLONOSCOPY WITH PROPOFOL;  Surgeon: Carol Ada, MD;  Location: [facility];  Service: Endoscopy;  Laterality: N/A;  . ESOPHAGOGASTRODUODENOSCOPY (EGD) WITH PROPOFOL N/A 07/24/2020   Procedure: ESOPHAGOGASTRODUODENOSCOPY (EGD) WITH PROPOFOL;  Surgeon: Carol Ada, MD;  Location: [facility];  Service: Endoscopy;  Laterality: N/A;  Bleeding with anemia-5 gms drop in hemoglobin  . GIVENS CAPSULE STUDY N/A 08/21/2020   Procedure: GIVENS CAPSULE STUDY;  Surgeon: Juanita Craver, MD;  Location: [facility] ENDOSCOPY;  Service: Endoscopy;  Laterality: N/A;     OB History    Gravida  5  Para      Term      Preterm      AB  1   Living  4     SAB  1   IAB      Ectopic      Multiple  1   Live Births  4           Family History  Problem Relation Age of Onset  . Cancer Mother   . Hypertension Sister   . Diabetes Sister   . Diabetes Maternal Grandmother     Social History   Tobacco Use  . Smoking status: Never Smoker  . Smokeless tobacco: Never Used  Vaping Use  . Vaping Use: Never  used  Substance Use Topics  . Alcohol use: No  . Drug use: No    Home Medications Prior to Admission medications   Medication Sig Start Date End Date Taking? Authorizing Provider  aspirin 81 MG EC tablet Take 81 mg by mouth daily. 03/01/20   [provider]  atorvastatin (LIPITOR) 80 MG tablet Take 1 tablet (80 mg total) by mouth daily at 6 PM. Patient not taking: Reported on 08/20/2020 05/24/19 01/11/20  Dessa Phi, DO  b complex vitamins tablet Take 1 tablet by mouth daily.    [provider]  blood glucose meter kit and supplies KIT Dispense based on patient and insurance preference. Use up to four times daily as directed. 07/27/20   Elgergawy, Silver Huguenin, MD  Calcium Carb-Cholecalciferol (CALCIUM+D3 PO) Take 1 tablet by mouth daily with lunch.    [provider]  FEROSUL 325 (65 Fe) MG tablet Take 325 mg by mouth 2 (two) times daily. 07/27/20   [provider]  fluconazole (DIFLUCAN) 100 MG tablet Take 1 tablet (100 mg total) by mouth daily for 10 days. 08/22/20 09/01/20  Shelly Coss, MD  gabapentin (NEURONTIN) 100 MG capsule Take 100 mg by mouth 3 (three) times daily. 08/01/20   [provider]  glucosamine-chondroitin 500-400 MG tablet Take 1 tablet by mouth 3 (three) times daily. Patient taking differently: Take 1 tablet by mouth 2 (two) times daily. 04/11/15   Funches, Adriana Mccallum, MD  insulin glargine (LANTUS) 100 UNIT/ML Solostar Pen Inject 18 Units into the skin daily. 07/27/20   Elgergawy, Silver Huguenin, MD  Insulin Pen Needle 29G X 5MM MISC USE WITH LANTUS 07/27/20   Elgergawy, Silver Huguenin, MD  melatonin 3 MG TABS tablet Take 1 tablet (3 mg total) by mouth at bedtime. 01/17/20   Regalado, Belkys A, MD  metoprolol tartrate (LOPRESSOR) 25 MG tablet Take 1 tablet (25 mg total) by mouth 2 (two) times daily. 01/17/20   Regalado, Belkys A, MD  Multiple Vitamin (MULTIVITAMIN WITH MINERALS) TABS tablet Take 1 tablet by mouth daily with lunch.     [provider]  Omega-3 Fatty Acids (FISH OIL) 1000 MG CAPS Take 1,000 mg by mouth daily with lunch.     [provider]  pantoprazole (PROTONIX) 40 MG tablet Take 1 tablet (40 mg total) by mouth daily. 01/17/20   Regalado, Belkys A, MD  polyethylene glycol (MIRALAX / GLYCOLAX) 17 g packet Take 17 g by mouth 2 (two) times daily. Patient taking differently: Take 17 g by mouth daily as needed for mild constipation. 01/17/20   Regalado, Belkys A, MD  predniSONE (DELTASONE) 20 MG tablet Take 40 mg by mouth in the morning and at bedtime. 07/17/20   [provider]  PRESCRIPTION MEDICATION Inhale into the  lungs See admin instructions. CPAP- At bedtime    [provider]    Allergies    Aspirin and Augmentin [amoxicillin-pot clavulanate]  Review of Systems   Review of Systems  Unable to perform ROS: Acuity of condition    Physical Exam Updated Vital Signs BP 117/88 (BP Location: Left Arm)   Pulse (!) 112   Temp 97.9 F (36.6 C)   Resp 13   Ht $R'5\' 3"'kv$  (1.6 m)   Wt 85 kg   SpO2 100%   BMI 33.19 kg/m   Physical Exam Vitals and nursing note reviewed.  Constitutional:      General: She is not in acute distress.    Appearance: She is obese. She is ill-appearing.     Comments: Patient is fatigued appearing 60 year old female appears ill  HENT:     Head: Normocephalic and atraumatic.     Nose: Nose normal.  Eyes:     General: No scleral icterus.    Comments: Pale conjunctiva  Cardiovascular:     Rate and Rhythm: Normal rate and regular rhythm.     Pulses: Normal pulses.     Heart sounds: Normal heart sounds.  Pulmonary:     Effort: Pulmonary effort is normal. No respiratory distress.     Breath sounds: No wheezing.  Abdominal:     Palpations: Abdomen is soft.     Tenderness: There is no abdominal tenderness. There is no guarding or rebound.  Genitourinary:    Comments: Copious maroon stool PR Musculoskeletal:     Cervical back: Normal range of motion.      Right lower leg: No edema.     Left lower leg: No edema.  Skin:    General: Skin is warm and dry.     Capillary Refill: Capillary refill takes less than 2 seconds.  Neurological:     Mental Status: She is alert.     Comments: Alert and oriented x3, moving all 4 extremities.  Somewhat lethargic appearing  Psychiatric:        Mood and Affect: Mood normal.        Behavior: Behavior normal.     ED Results / Procedures / Treatments   Labs (all labs ordered are listed, but only abnormal results are displayed) Labs Reviewed  CBC WITH DIFFERENTIAL/PLATELET - Abnormal; Notable for the following components:      Result Value   RBC 2.59 (*)    Hemoglobin 8.1 (*)    HCT 26.3 (*)    MCV 101.5 (*)    RDW 19.0 (*)    nRBC 2.4 (*)    Lymphs Abs 0.6 (*)    Abs Immature Granulocytes 0.40 (*)    All other components within normal limits  COMPREHENSIVE METABOLIC PANEL - Abnormal; Notable for the following components:   Sodium 124 (*)    Chloride 89 (*)    Glucose, Bld 616 (*)    Creatinine, Ser 1.04 (*)    Calcium 8.1 (*)    Total Protein 4.5 (*)    Albumin 2.6 (*)    Alkaline Phosphatase 137 (*)    All other components within normal limits  VITAMIN B12 - Abnormal; Notable for the following components:   Vitamin B-12 2,853 (*)    All other components within normal limits  IRON AND TIBC - Abnormal; Notable for the following components:   TIBC 217 (*)    Saturation Ratios 33 (*)    All other components within normal limits  RETICULOCYTES -  Abnormal; Notable for the following components:   Retic Ct Pct 5.1 (*)    RBC. 2.38 (*)    Immature Retic Fract 40.8 (*)    All other components within normal limits  POC OCCULT BLOOD, ED - Abnormal; Notable for the following components:   Fecal Occult Bld POSITIVE (*)    All other components within normal limits  CBG MONITORING, ED - Abnormal; Notable for the following components:   Glucose-Capillary 535 (*)    All other components within normal  limits  RESP PANEL BY RT-PCR (FLU A&B, COVID) ARPGX2  PROTIME-INR  FOLATE  FERRITIN  CBC  BASIC METABOLIC PANEL  MAGNESIUM  PHOSPHORUS  HEMOGLOBIN AND HEMATOCRIT, BLOOD  SAMPLE TO BLOOD BANK  PREPARE RBC (CROSSMATCH)  TYPE AND SCREEN    EKG EKG Interpretation  Date/Time:  Wednesday August 29 2020 19:38:24 EDT Ventricular Rate:  123 PR Interval:  82 QRS Duration: 106 QT Interval:  348 QTC Calculation: 498 R Axis:   -8 Text Interpretation: Sinus tachycardia Abnormal ECG Otherwise no significant change Confirmed by Melene Plan [phone]) on 08/29/2020 7:45:26 PM   Radiology CT Angio Abd/Pel W and/or Wo Contrast  Result Date: 08/29/2020 CLINICAL DATA:  Gastrointestinal bleeding. EXAM: CTA ABDOMEN AND PELVIS WITHOUT AND WITH CONTRAST TECHNIQUE: Multidetector CT imaging of the abdomen and pelvis was performed using the standard protocol during bolus administration of intravenous contrast. Multiplanar reconstructed images and MIPs were obtained and reviewed to evaluate the vascular anatomy. CONTRAST:  OMNIPAQUE IOHEXOL 350 MG/ML SOLN COMPARISON:  July 23, 2020. FINDINGS: VASCULAR Aorta: Normal caliber aorta without aneurysm, dissection, vasculitis or significant stenosis. Celiac: Patent without evidence of aneurysm, dissection, vasculitis or significant stenosis. SMA: Patent without evidence of aneurysm, dissection, vasculitis or significant stenosis. Renals: Both renal arteries are patent without evidence of aneurysm, dissection, vasculitis, fibromuscular dysplasia or significant stenosis. IMA: Patent without evidence of aneurysm, dissection, vasculitis or significant stenosis. Inflow: Patent without evidence of aneurysm, dissection, vasculitis or significant stenosis. Proximal Outflow: Bilateral common femoral and visualized portions of the superficial and profunda femoral arteries are patent without evidence of aneurysm, dissection, vasculitis or significant stenosis. Veins: No obvious  venous abnormality within the limitations of this arterial phase study. Review of the MIP images confirms the above findings. NON-VASCULAR Lower chest: No acute abnormality. Hepatobiliary: No focal liver abnormality is seen. No gallstones, gallbladder wall thickening, or biliary dilatation. Pancreas: Unremarkable. No pancreatic ductal dilatation or surrounding inflammatory changes. Spleen: Normal in size without focal abnormality. Adrenals/Urinary Tract: Adrenal glands are unremarkable. Kidneys are normal, without renal calculi, focal lesion, or hydronephrosis. Bladder is unremarkable. Stomach/Bowel: The stomach is unremarkable. Diverticulosis of descending and sigmoid colon is noted. Is no evidence of bowel obstruction or inflammation. However, there does appear to be some degree of contrast extravasation involving the distal descending colon concerning for active gastrointestinal hemorrhage. This is best seen on image number 141 of series 6. Lymphatic: No significant adenopathy is noted. Reproductive: Status post hysterectomy. No adnexal masses. Other: No abdominal wall hernia or abnormality. No abdominopelvic ascites. Musculoskeletal: No fracture is seen. IMPRESSION: VASCULAR Contrast extravasation is seen involving the distal descending colon concerning for active gastrointestinal hemorrhage. Critical Value/emergent results were called by telephone at the time of interpretation on 08/29/2020 at 9:37 pm to provider [facility] , who verbally acknowledged these results. There is no evidence of abdominal aortic dissection or aneurysm. No definite mesenteric or renal artery stenosis is noted. NON-VASCULAR Sigmoid diverticulosis without inflammation. Aortic Atherosclerosis (ICD10-I70.0). Electronically Signed  By: Marijo Conception M.D.   On: 08/29/2020 21:37    Procedures .Critical Care Performed by: Tedd Sias, PA Authorized by: Tedd Sias, PA   Critical care provider statement:    Critical  care time (minutes):  45   Critical care was necessary to treat or prevent imminent or life-threatening deterioration of the following conditions: Symptomatic anemia from acute GI bleed with active hemorrhage and hypovolemic shock.   Critical care was time spent personally by me on the following activities:  Discussions with consultants, evaluation of patient's response to treatment, examination of patient, ordering and performing treatments and interventions, ordering and review of laboratory studies, ordering and review of radiographic studies, pulse oximetry, re-evaluation of patient's condition, obtaining history from patient or surrogate and review of old charts Comments:     Patient presented hypotensive tachycardic and required emergent boluses of 2 units of blood due to worsening hypotension.     Medications Ordered in ED Medications  midazolam (VERSED) 2 MG/2ML injection (has no administration in time range)  fentaNYL (SUBLIMAZE) 100 MCG/2ML injection (has no administration in time range)  lidocaine (PF) (XYLOCAINE) 1 % injection (has no administration in time range)  lactated ringers infusion (has no administration in time range)  docusate sodium (COLACE) capsule 100 mg (has no administration in time range)  polyethylene glycol (MIRALAX / GLYCOLAX) packet 17 g (has no administration in time range)  iohexol (OMNIPAQUE) 300 MG/ML solution 100 mL (has no administration in time range)  iohexol (OMNIPAQUE) 300 MG/ML solution 100 mL (has no administration in time range)  pantoprazole (PROTONIX) injection 40 mg (40 mg Intravenous Given 08/29/20 2033)  0.9 %  sodium chloride infusion (Manually program via Guardrails IV Fluids) ( Intravenous New Bag/Given 08/29/20 2053)  iohexol (OMNIPAQUE) 350 MG/ML injection 100 mL (100 mLs Intravenous Contrast Given 08/29/20 2113)  ondansetron (ZOFRAN) injection 4 mg (4 mg Intravenous Given 08/29/20 2230)  midazolam (VERSED) injection (0.5 mg Intravenous Given  08/29/20 2254)  fentaNYL (SUBLIMAZE) injection (25 mcg Intravenous Given 08/29/20 2254)  0.9 %  sodium chloride infusion (10 mL/hr Intravenous New Bag/Given 08/29/20 2254)    ED Course  I have reviewed the triage vital signs and the nursing notes.  Pertinent labs & imaging results that were available during my care of the patient were reviewed by me and considered in my medical decision making (see chart for details).  Patient is 60 year old female history of diverticular bleeds.  Has been in the hospital twice in the last month.  Recent pill endoscopy was unremarkable apart from candidiasis.  She had episode of BRBPR at 7 PM tonight. Rectal exam notable for copious maroon stool.  Blood pressure initially 115/83 however with tachycardia of 123.  Blood ordered empirically given exam with pale conjunctiva and tachycardia and known source of bleeding.  Patient's blood pressure then decreased with systolics in the 91Y.  Patient received 2 units of emergent release blood.  By this time I discussed with Dr. Collene Mares who recommended IR.  CT angio obtained.  Patient's blood pressure improved to 782N systolic after IV blood.  CT consulted and accepted patient emergently.  Myself and RN accompany patient to CT scanner she was monitored throughout event.  She was returned to room.  Radiology called to discuss diverticular active bleed.  IR reconsulted.  Taken to [facility] suite for embolization.  PCCM consulted.  They will evaluate patient at bedside.  Notably patient with negative Covid. CMP have hyperglycemia and appropriately hyponatremic which corrects to  normal. CBC with hemoglobin of 8.1.  This is a decrease of approximately 2 from a week ago. INR within normal limits.  The patient takes aspirin.  Clinical Course as of 08/29/20 2331  Wed Aug 29, 2020  2046 Discussed with Dr. Collene Mares who recommended CT angio abdomen pelvis.  Resuscitate as needed and admit to hospitalist.  Recommended IR consultation in case  embolization is needed. [WF]  2048 Bilateral IVs placed.  Will closely monitor patient for need for emergent release blood. [WF]  2242 Discussed with PCCM who will eval pt at bedside once returned from IR.  [WF]    Clinical Course User Index [WF] Tedd Sias, PA   MDM Rules/Calculators/A&P                           Admitted by PCCM  Final Clinical Impression(s) / ED Diagnoses Final diagnoses:  Rectal bleeding  Acute GI bleeding  Hypotension, unspecified hypotension type    Rx / DC Orders ED Discharge Orders    None       Tedd Sias, [place] 08/29/20 2331    Tedd Sias, PA 08/29/20 [place], [place], DO 08/29/20 2339

## 2020-08-29 NOTE — H&P (Signed)
NAME:  Rhonda [name], MRN:  [id number], DOB:  Aug 30, 1960, LOS: 0 ADMISSION DATE:  08/29/2020, CONSULTATION DATE:  08/29/20 REFERRING MD:  Tyrone Nine CHIEF COMPLAINT:  GI bleed   History of Present Illness:  Rhonda [name] is a 60 y.o. female who presented to [facility] ED 08/29/20 with recal bleeding that began that morning.  Has had multiple admissions for the same in the past, most recently 08/20/20 through 08/22/20.  She had capsule endoscopy that admit that showed small bowel erythema and esophageal candidiasis.  Prior to that, had EGD 3/8 / colonoscopy 3/10 on prior admission which was negative for bleeding but showed sigmoid diverticulosis.  In ED, she was hypotensive with SBP in the 50's and Hgb 8. CTA showed bleedign in distal descending colon. She was transfused 2u PRBC and was taken to IR where she had coil embolization of intraluminal extravasation for a distal arcade branch of the IMA.  Post procedure, she was admitted to the ICU for further monitoring.  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  Significant Hospital Events: Including procedures, antibiotic start and stop dates in addition to other pertinent events   4/13 > admit, taken  to IR for coil embolization of intraluminal extravasation for a distal arcade branch of the IMA.  Micro Data: Flu 4/13 > neg. SARS CoV2 4/13 > neg.  Antibiotics: None.  Interim History / Subjective:  2 bloody BM's after arrival to ICU.  Hemodynamically stable. H/H pending.  Objective:  Blood pressure 118/85, pulse (!) 116, temperature 97.9 F (36.6 C), resp. rate (!) 22, height _0  (1.6 m), weight 85 kg, SpO2 99 %.       No intake or output data in the 24 hours ending 08/29/20 2247 Filed Weights   08/29/20 1936  Weight: 85 kg    Examination: General: Adult female, resting in bed, in NAD. Neuro: A&O x 3, no deficits. HEENT: [place]/AT. Sclerae anicteric. EOMI. Cardiovascular: RRR, no M/R/G.  Lungs: Respirations even and unlabored.  CTA bilaterally, No W/R/R. Abdomen: BS x 4, soft, NT/ND.  Musculoskeletal: No gross deformities, no edema.  Skin: Intact, warm, no rashes.  Labs/imaging personally reviewed:  CTA abd / pelv 4/13 > contrast extrav in distal descending colon.  Resolved Hospital Problem list:   Assessment & Plan:   Hemorrhagic shock - 2/2 GI bleed.  S/p coil embolization of intraluminal extravasation for a distal arcade branch of the IMA.. - Post op care per IR. - Monitor H/H, transfuse for Hgb < 7. - Monitor for ongoing bleeding. - Hold home ASA.  AKI - 2/2 above. - Supportive care. - Follow BMP.  Hx neurosarcoidosis. - Continue home Prednisone and Gabapentin.Marland Kitchen  Hx DM - now likely with HHS. - Start insulin gtt.  - Hold home insulin, Lantus.  Hx HTN, HLD. - Hold home Lopressor, Lipitor.  Hx OSA on CPAP. - Continue home CPAP.  Best practice (evaluated daily):  Diet:  NPO Pain/Anxiety/Delirium protocol (if indicated): No VAP protocol (if indicated): Not indicated DVT prophylaxis: Contraindicated GI prophylaxis: N/A Glucose control:  Insulin gtt Central venous access:  N/A Arterial line:  N/A Foley:  N/A Mobility:  bed rest  PT consulted:  N/A Last date of multidisciplinary goals of care discussion: none yet. Code Status:  full code Disposition: ICU  Labs   CBC: Recent Labs  Lab 08/29/20 2003  WBC 9.0  NEUTROABS 7.6  HGB 8.1*  HCT 26.3*  MCV 101.5*  PLT 333    Basic Metabolic Panel: Recent Labs  Lab 08/29/20 2003  NA 124*  K 4.8  CL 89*  CO2 23  GLUCOSE 616*  BUN 10  CREATININE 1.04*  CALCIUM 8.1*   GFR: Estimated Creatinine Clearance: 60.1 mL/min (A) (by C-G formula based on SCr of 1.04 mg/dL (H)). Recent Labs  Lab 08/29/20 2003  WBC 9.0    Liver Function Tests: Recent Labs  Lab 08/29/20 2003  AST 37  ALT <5  ALKPHOS 137*  BILITOT 0.7  PROT 4.5*  ALBUMIN 2.6*   No results for input(s): LIPASE, AMYLASE in the last 168 hours. No results for input(s): AMMONIA in the last 168 hours.  ABG    Component Value Date/Time   TCO2 29 01/07/2020 2301     Coagulation Profile: Recent Labs  Lab 08/29/20 2003  INR 1.1    Cardiac Enzymes: No results for input(s): CKTOTAL, CKMB, CKMBINDEX, TROPONINI in the last 168 hours.  HbA1C: Hgb A1c MFr Bld  Date/Time Value Ref Range Status  07/23/2020 04:18 AM 11.8 (H) 4.8 - 5.6 % Final    Comment:    (NOTE) Pre diabetes:          5.7%-6.4%  Diabetes:              >6.4%  Glycemic control for   <7.0% adults with diabetes   11/14/2019 01:09 AM 5.9 (H) 4.8 - 5.6 % Final    Comment:    (NOTE) Pre diabetes:          5.7%-6.4%  Diabetes:              >6.4%  Glycemic control for   <7.0% adults with diabetes     CBG: Recent Labs  Lab 08/29/20 2157  GLUCAP 535*    Review of Systems:   All negative; except for those that are bolded, which indicate positives.  Constitutional: weight loss, weight gain, night sweats, fevers, chills, fatigue, weakness.  HEENT: headaches, sore throat, sneezing, nasal congestion, post nasal drip, difficulty swallowing, tooth/dental problems, visual complaints, visual changes, ear aches. Neuro: difficulty  with speech, weakness, numbness, ataxia. CV:  chest pain, orthopnea, PND, swelling in lower extremities, dizziness, palpitations, syncope.  Resp: cough, hemoptysis, dyspnea, wheezing. GI: heartburn, indigestion, abdominal pain, nausea, vomiting, diarrhea, constipation, change in bowel habits, loss of appetite, hematemesis, melena, hematochezia.  GU: dysuria, change in color of urine, urgency or frequency, flank pain, hematuria. MSK: joint pain or swelling, decreased range of motion. Psych: change in mood or affect, depression, anxiety, suicidal ideations, homicidal ideations. Skin: rash, itching, bruising.   Past Medical History:  She,  has a past medical history of Arthritis, Degenerative disc disease, lumbar, GERD (gastroesophageal reflux disease), Hypertension, OSA (obstructive sleep apnea), Prediabetes, Sarcoidosis of central nervous system, Seizures ([place]), Type 2, and Urinary incontinence.   Surgical History:   Past Surgical History:  Procedure Laterality Date  . ABDOMINAL HYSTERECTOMY    . BREAST EXCISIONAL BIOPSY Right   .  BREAST SURGERY     right breast  . CESAREAN SECTION    . COLONOSCOPY WITH PROPOFOL N/A 07/26/2020   Procedure: COLONOSCOPY WITH PROPOFOL;  Surgeon: Carol Ada, MD;  Location: [facility];  Service: Endoscopy;  Laterality: N/A;  . ESOPHAGOGASTRODUODENOSCOPY (EGD) WITH PROPOFOL N/A 07/24/2020   Procedure: ESOPHAGOGASTRODUODENOSCOPY (EGD) WITH PROPOFOL;  Surgeon: Carol Ada, MD;  Location: [facility];  Service: Endoscopy;  Laterality: N/A;  Bleeding with anemia-5 gms drop in hemoglobin  . GIVENS CAPSULE STUDY N/A 08/21/2020   Procedure: GIVENS CAPSULE STUDY;  Surgeon: Juanita Craver, MD;  Location: [facility] ENDOSCOPY;  Service: Endoscopy;  Laterality: N/A;     Social History:   reports that she has never smoked. She has never used smokeless tobacco. She reports that she does not drink alcohol and does not use drugs.   Family History:  Her family history includes  Cancer in her mother; Diabetes in her maternal grandmother and sister; Hypertension in her sister.   Allergies Allergies  Allergen Reactions  . Aspirin Nausea And Vomiting  . Augmentin [Amoxicillin-Pot Clavulanate] Swelling and Other (See Comments)    Hand swelled at IV site     Home Medications  Prior to Admission medications   Medication Sig Start Date End Date Taking? Authorizing Provider  aspirin 81 MG EC tablet Take 81 mg by mouth daily. 03/01/20   [provider]  atorvastatin (LIPITOR) 80 MG tablet Take 1 tablet (80 mg total) by mouth daily at 6 PM. Patient not taking: Reported on 08/20/2020 05/24/19 01/11/20  Dessa Phi, DO  b complex vitamins tablet Take 1 tablet by mouth daily.    [provider]  blood glucose meter kit and supplies KIT Dispense based on patient and insurance preference. Use up to four times daily as directed. 07/27/20   Elgergawy, Silver Huguenin, MD  Calcium Carb-Cholecalciferol (CALCIUM+D3 PO) Take 1 tablet by mouth daily with lunch.    [provider]  FEROSUL 325 (65 Fe) MG tablet Take 325 mg by mouth 2 (two) times daily. 07/27/20   [provider]  fluconazole (DIFLUCAN) 100 MG tablet Take 1 tablet (100 mg total) by mouth daily for 10 days. 08/22/20 09/01/20  Shelly Coss, MD  gabapentin (NEURONTIN) 100 MG capsule Take 100 mg by mouth 3 (three) times daily. 08/01/20   [provider]  glucosamine-chondroitin 500-400 MG tablet Take 1 tablet by mouth 3 (three) times daily. Patient taking differently: Take 1 tablet by mouth 2 (two) times daily. 04/11/15   Funches, Adriana Mccallum, MD  insulin glargine (LANTUS) 100 UNIT/ML Solostar Pen Inject 18 Units into the skin daily. 07/27/20   Elgergawy, Silver Huguenin, MD  Insulin Pen Needle 29G X 5MM MISC USE WITH LANTUS 07/27/20   Elgergawy, Silver Huguenin, MD  melatonin 3 MG TABS tablet Take 1 tablet (3 mg total) by mouth at bedtime. 01/17/20   Regalado, Belkys A, MD  metoprolol tartrate (LOPRESSOR) 25 MG  tablet Take 1 tablet (25 mg total) by mouth 2 (two) times daily. 01/17/20   Regalado, Belkys A, MD  Multiple Vitamin (MULTIVITAMIN WITH MINERALS) TABS tablet Take 1 tablet by mouth daily with lunch.     [provider]  Omega-3 Fatty Acids (FISH OIL) 1000 MG CAPS Take 1,000 mg by mouth daily with lunch.     [provider]  pantoprazole (PROTONIX) 40 MG tablet Take 1 tablet (40 mg total) by mouth daily. 01/17/20   Regalado, Belkys A, MD  polyethylene glycol (MIRALAX / GLYCOLAX) 17 g packet  Take 17 g by mouth 2 (two) times daily. Patient taking differently: Take 17 g by mouth daily as needed for mild constipation. 01/17/20   Regalado, Belkys A, MD  predniSONE (DELTASONE) 20 MG tablet Take 40 mg by mouth in the morning and at bedtime. 07/17/20   [provider]  PRESCRIPTION MEDICATION Inhale into the lungs See admin instructions. CPAP- At bedtime    [provider]     Critical care time: 40 min.    Montey Hora, [facility] For pager details, please see AMION or use Epic chat  After 1900, please call [facility] for cross coverage needs 08/30/2020, 12:37 AM

## 2020-08-29 NOTE — ED Triage Notes (Signed)
Patient reports rectal bleeding this morning , no abdominal pain , denies fever or chills , patient unsure if she is taking anticoagulant . Respirations unlabored/tachycardic at triage .

## 2020-08-29 NOTE — ED Notes (Signed)
Patient noted to be more tachy and starting to become hypotensive, 1 unit PRBCs picked up from blood bank, when this RN returned, patient was in IR for procedure, per IR, blood can be returned to blood bank, this RN returned unit to [place] in blood bank.

## 2020-08-29 NOTE — ED Notes (Signed)
Patient to IR

## 2020-08-30 ENCOUNTER — Inpatient Hospital Stay (HOSPITAL_COMMUNITY): Payer: 59

## 2020-08-30 DIAGNOSIS — K5791 Diverticulosis of intestine, part unspecified, without perforation or abscess with bleeding: Secondary | ICD-10-CM | POA: Diagnosis not present

## 2020-08-30 DIAGNOSIS — N179 Acute kidney failure, unspecified: Secondary | ICD-10-CM

## 2020-08-30 DIAGNOSIS — K625 Hemorrhage of anus and rectum: Secondary | ICD-10-CM

## 2020-08-30 DIAGNOSIS — I959 Hypotension, unspecified: Secondary | ICD-10-CM

## 2020-08-30 LAB — CBC
HCT: 27.4 % — ABNORMAL LOW (ref 36.0–46.0)
Hemoglobin: 8.9 g/dL — ABNORMAL LOW (ref 12.0–15.0)
MCH: 30.5 pg (ref 26.0–34.0)
MCHC: 32.5 g/dL (ref 30.0–36.0)
MCV: 93.8 fL (ref 80.0–100.0)
Platelets: 157 10*3/uL (ref 150–400)
RBC: 2.92 MIL/uL — ABNORMAL LOW (ref 3.87–5.11)
RDW: 18.3 % — ABNORMAL HIGH (ref 11.5–15.5)
WBC: 17.3 10*3/uL — ABNORMAL HIGH (ref 4.0–10.5)
nRBC: 3.2 % — ABNORMAL HIGH (ref 0.0–0.2)

## 2020-08-30 LAB — HEMOGLOBIN AND HEMATOCRIT, BLOOD
HCT: 28.8 % — ABNORMAL LOW (ref 36.0–46.0)
HCT: 28 % — ABNORMAL LOW (ref 36.0–46.0)
Hemoglobin: 9.3 g/dL — ABNORMAL LOW (ref 12.0–15.0)
Hemoglobin: 9.2 g/dL — ABNORMAL LOW (ref 12.0–15.0)

## 2020-08-30 LAB — BASIC METABOLIC PANEL
Anion gap: 6 (ref 5–15)
BUN: 11 mg/dL (ref 6–20)
CO2: 31 mmol/L (ref 22–32)
Calcium: 8.4 mg/dL — ABNORMAL LOW (ref 8.9–10.3)
Chloride: 99 mmol/L (ref 98–111)
Creatinine, Ser: 0.82 mg/dL (ref 0.44–1.00)
GFR, Estimated: >60 mL/min (ref >60)
Glucose, Bld: 146 mg/dL — ABNORMAL HIGH (ref 70–99)
Potassium: 4.1 mmol/L (ref 3.5–5.1)
Sodium: 136 mmol/L (ref 135–145)

## 2020-08-30 LAB — GLUCOSE, CAPILLARY
Glucose-Capillary: 110 mg/dL — ABNORMAL HIGH (ref 70–99)
Glucose-Capillary: 151 mg/dL — ABNORMAL HIGH (ref 70–99)
Glucose-Capillary: 161 mg/dL — ABNORMAL HIGH (ref 70–99)
Glucose-Capillary: 236 mg/dL — ABNORMAL HIGH (ref 70–99)
Glucose-Capillary: 260 mg/dL — ABNORMAL HIGH (ref 70–99)
Glucose-Capillary: 274 mg/dL — ABNORMAL HIGH (ref 70–99)
Glucose-Capillary: 289 mg/dL — ABNORMAL HIGH (ref 70–99)
Glucose-Capillary: 307 mg/dL — ABNORMAL HIGH (ref 70–99)
Glucose-Capillary: 334 mg/dL — ABNORMAL HIGH (ref 70–99)
Glucose-Capillary: 384 mg/dL — ABNORMAL HIGH (ref 70–99)
Glucose-Capillary: 444 mg/dL — ABNORMAL HIGH (ref 70–99)
Glucose-Capillary: 449 mg/dL — ABNORMAL HIGH (ref 70–99)
Glucose-Capillary: 456 mg/dL — ABNORMAL HIGH (ref 70–99)

## 2020-08-30 LAB — MRSA PCR SCREENING: MRSA by PCR: POSITIVE — AB

## 2020-08-30 LAB — PHOSPHORUS: Phosphorus: 2.1 mg/dL — ABNORMAL LOW (ref 2.5–4.6)

## 2020-08-30 LAB — MAGNESIUM: Magnesium: 1.7 mg/dL (ref 1.7–2.4)

## 2020-08-30 MED ORDER — INSULIN GLARGINE 100 UNIT/ML ~~LOC~~ SOLN
9.0000 [IU] | Freq: Every day | SUBCUTANEOUS | Status: DC
  Filled 2020-08-30: qty 0.09

## 2020-08-30 MED ORDER — INSULIN REGULAR(HUMAN) IN NACL 100-0.9 UT/100ML-% IV SOLN
INTRAVENOUS | Status: DC
  Administered 2020-08-30: 13 [IU]/h via INTRAVENOUS
  Filled 2020-08-30: qty 100

## 2020-08-30 MED ORDER — POTASSIUM PHOSPHATES 15 MMOLE/5ML IV SOLN
30.0000 mmol | Freq: Once | INTRAVENOUS | Status: AC
  Administered 2020-08-30: 30 mmol via INTRAVENOUS
  Filled 2020-08-30: qty 10

## 2020-08-30 MED ORDER — GABAPENTIN 600 MG PO TABS: 300.0000 mg | ORAL_TABLET | Freq: Three times a day (TID) | ORAL | Status: DC

## 2020-08-30 MED ORDER — SODIUM CHLORIDE 0.9 % IV BOLUS
1000.0000 mL | Freq: Once | INTRAVENOUS | Status: AC
  Administered 2020-08-30: 1000 mL via INTRAVENOUS

## 2020-08-30 MED ORDER — GABAPENTIN 300 MG PO CAPS: 300.0000 mg | ORAL_CAPSULE | Freq: Three times a day (TID) | ORAL | Status: DC

## 2020-08-30 MED ORDER — ORAL CARE MOUTH RINSE
15.0000 mL | Freq: Two times a day (BID) | OROMUCOSAL | Status: DC
  Administered 2020-08-30 – 2020-09-28 (×51): 15 mL via OROMUCOSAL

## 2020-08-30 MED ORDER — ONDANSETRON HCL 4 MG/2ML IJ SOLN
4.0000 mg | Freq: Four times a day (QID) | INTRAMUSCULAR | Status: DC | PRN
  Administered 2020-08-30 – 2020-09-16 (×10): 4 mg via INTRAVENOUS
  Filled 2020-08-30 (×11): qty 2

## 2020-08-30 MED ORDER — INSULIN ASPART 100 UNIT/ML ~~LOC~~ SOLN
0.0000 [IU] | SUBCUTANEOUS | Status: DC
  Administered 2020-08-30: 8 [IU] via SUBCUTANEOUS
  Administered 2020-08-30: 3 [IU] via SUBCUTANEOUS
  Administered 2020-08-30 (×2): 11 [IU] via SUBCUTANEOUS
  Administered 2020-08-30: 8 [IU] via SUBCUTANEOUS
  Administered 2020-08-31: 3 [IU] via SUBCUTANEOUS
  Administered 2020-08-31 (×2): 8 [IU] via SUBCUTANEOUS
  Administered 2020-08-31 (×2): 5 [IU] via SUBCUTANEOUS
  Administered 2020-08-31: 2 [IU] via SUBCUTANEOUS
  Administered 2020-09-01 (×2): 5 [IU] via SUBCUTANEOUS
  Administered 2020-09-01: 3 [IU] via SUBCUTANEOUS
  Administered 2020-09-01: 5 [IU] via SUBCUTANEOUS
  Administered 2020-09-01 – 2020-09-02 (×2): 3 [IU] via SUBCUTANEOUS
  Administered 2020-09-02: 8 [IU] via SUBCUTANEOUS
  Administered 2020-09-02: 5 [IU] via SUBCUTANEOUS
  Administered 2020-09-02: 3 [IU] via SUBCUTANEOUS
  Administered 2020-09-02: 5 [IU] via SUBCUTANEOUS
  Administered 2020-09-03: 3 [IU] via SUBCUTANEOUS
  Administered 2020-09-03: 2 [IU] via SUBCUTANEOUS
  Administered 2020-09-03: 3 [IU] via SUBCUTANEOUS
  Administered 2020-09-03: 2 [IU] via SUBCUTANEOUS
  Administered 2020-09-03 – 2020-09-04 (×4): 3 [IU] via SUBCUTANEOUS
  Administered 2020-09-05: 5 [IU] via SUBCUTANEOUS
  Administered 2020-09-05 (×2): 3 [IU] via SUBCUTANEOUS
  Administered 2020-09-06: 2 [IU] via SUBCUTANEOUS
  Administered 2020-09-06: 5 [IU] via SUBCUTANEOUS
  Administered 2020-09-06: 8 [IU] via SUBCUTANEOUS
  Administered 2020-09-06 (×3): 2 [IU] via SUBCUTANEOUS
  Administered 2020-09-07 (×2): 8 [IU] via SUBCUTANEOUS

## 2020-08-30 MED ORDER — GABAPENTIN 100 MG PO CAPS
100.0000 mg | ORAL_CAPSULE | Freq: Three times a day (TID) | ORAL | Status: DC
  Administered 2020-08-30 – 2020-09-28 (×78): 100 mg via ORAL
  Filled 2020-08-30 (×79): qty 1

## 2020-08-30 MED ORDER — PANTOPRAZOLE SODIUM 40 MG IV SOLR
40.0000 mg | INTRAVENOUS | Status: DC
  Administered 2020-08-30 – 2020-09-18 (×20): 40 mg via INTRAVENOUS
  Filled 2020-08-30 (×20): qty 40

## 2020-08-30 MED ORDER — DEXTROSE 50 % IV SOLN: 0.0000 mL | INTRAVENOUS | Status: DC | PRN

## 2020-08-30 MED ORDER — GABAPENTIN 300 MG PO CAPS
300.0000 mg | ORAL_CAPSULE | Freq: Three times a day (TID) | ORAL | Status: DC
  Administered 2020-08-30: 300 mg via ORAL
  Filled 2020-08-30: qty 1

## 2020-08-30 MED ORDER — MUPIROCIN 2 % EX OINT
1.0000 "application " | TOPICAL_OINTMENT | Freq: Two times a day (BID) | CUTANEOUS | Status: AC
  Administered 2020-08-30 – 2020-09-03 (×10): 1 via NASAL
  Filled 2020-08-30 (×2): qty 22

## 2020-08-30 MED ORDER — DEXTROSE-NACL 5-0.9 % IV SOLN: INTRAVENOUS | Status: DC

## 2020-08-30 MED ORDER — NAPHAZOLINE-GLYCERIN 0.012-0.2 % OP SOLN
1.0000 [drp] | Freq: Four times a day (QID) | OPHTHALMIC | Status: DC | PRN
  Administered 2020-08-30: 2 [drp] via OPHTHALMIC
  Administered 2020-09-01: 1 [drp] via OPHTHALMIC
  Administered 2020-09-02 (×2): 2 [drp] via OPHTHALMIC
  Administered 2020-09-03: 1 [drp] via OPHTHALMIC
  Filled 2020-08-30 (×2): qty 15

## 2020-08-30 MED ORDER — MAGNESIUM SULFATE 2 GM/50ML IV SOLN
2.0000 g | Freq: Once | INTRAVENOUS | Status: AC
  Administered 2020-08-30: 2 g via INTRAVENOUS
  Filled 2020-08-30: qty 50

## 2020-08-30 MED ORDER — INSULIN GLARGINE 100 UNIT/ML ~~LOC~~ SOLN
9.0000 [IU] | Freq: Once | SUBCUTANEOUS | Status: AC
  Administered 2020-08-30: 9 [IU] via SUBCUTANEOUS
  Filled 2020-08-30: qty 0.09

## 2020-08-30 MED ORDER — PREDNISONE 20 MG PO TABS
40.0000 mg | ORAL_TABLET | Freq: Two times a day (BID) | ORAL | Status: DC
  Administered 2020-08-30 – 2020-09-01 (×6): 40 mg via ORAL
  Filled 2020-08-30 (×6): qty 2

## 2020-08-30 MED ORDER — CHLORHEXIDINE GLUCONATE CLOTH 2 % EX PADS
6.0000 | MEDICATED_PAD | Freq: Every day | CUTANEOUS | Status: DC
  Administered 2020-08-30 – 2020-09-02 (×5): 6 via TOPICAL

## 2020-08-30 NOTE — Progress Notes (Signed)
NAME:  Rhonda [name], MRN:  [id number], DOB:  1961/05/17, LOS: 1 ADMISSION DATE:  08/29/2020, CONSULTATION DATE:  08/29/20 REFERRING MD:  Tyrone Nine CHIEF COMPLAINT:  GI bleed   History of Present Illness:  Rhonda [name] is a 60 y.o. female who presented to [facility] ED 08/29/20 with recal bleeding that began that morning with multiple historical admissions for same. CTA described acute hemorrhage in the descending colon. She was taken to IR for coil embolization of a distal arcade branch of the IMA. Post op course uncomplicated.   Pertinent  Medical History:  has Essential hypertension; Chronic low back pain; Prediabetes; Vitamin D deficiency; Right hip pain; OSA (obstructive sleep apnea); Piriformis syndrome of right side; Left pontine stroke ([place]); Transaminitis; Nausea and vomiting; Leukocytosis; Thoracic lymphadenopathy; Axillary lymphadenopathy; Demyelinating changes in brain [facility]); Memory changes; Altered mental status; Hyponatremia; Speech disturbance; AMS (altered mental status); HLD (hyperlipidemia); Acute lower UTI; Acute lower GI bleeding; Hyperosmolar hyperglycemic state (HHS) ([place]); Diverticulitis of large intestine with bleeding; Liver lesion; Muscle tumor; Neurosarcoidosis; High risk medication use; Weakness; Type 2 diabetes mellitus without complication, with long-term current use of insulin ([place]); GI bleed; Hypotension; Rectal bleeding; and AKI (acute kidney injury) ([place]) on their problem list.  Significant Hospital Events: Including procedures, antibiotic start and stop dates in addition to other pertinent events   4/13 > admit, taken to IR for coil embolization of intraluminal extravasation for a distal arcade branch of the IMA.  Micro Data: Flu 4/13 > neg SARS CoV2 4/13 > neg  Antibiotics: None  Interim History / Subjective:  Several episodes of emesis overnight. No blood noted by ICU staff.   Objective:  Blood pressure 102/89, pulse (!) 151, temperature 98.4 F (36.9 C),  temperature source Oral, resp. rate (!) 23, height 5\' 3"  (1.6 m), weight 79.5 kg, SpO2 95 %.        Intake/Output Summary (Last 24 hours) at 08/30/2020 0715 Last data filed at 08/30/2020 0600 Gross per 24 hour  Intake 1317.49 ml  Output --  Net 1317.49 ml   Filed Weights   08/29/20 1936 08/30/20 0022 08/30/20 0500  Weight: 85 kg 79.5 kg 79.5 kg    Examination:  General: overweight middle aged female in NAD Neuro: Drowsy but easily arouses to verbal. Oriented. Non-focal.  HEENT:  [place]/AT, PERRL, no JVD Cardiovascular: Tachy, regular, no MRG Lungs: Clear bilateral breath sounds, unlabored.  Abdomen: Soft, NT, ND Musculoskeletal: No acute deformities or ROM limitation Skin: Grossly intact. MM dry.   Labs/imaging personally reviewed:  CTA abd / pelv 4/13 > contrast extrav in distal descending colon.  Resolved Hospital Problem list:    Assessment & Plan:   Hemorrhagic shock - 2/2 GI bleed.  CTA showed contrast extravasation into the distal descending colon. S/p coil embolization of intraluminal extravasation for a distal arcade branch of the IMA. Hemoglobin slowly improving - Monitor H/H, transfuse for Hgb < 7. - PPI - IR to follow  AKI - improved Hypomag Hypophos - Hydrate - Supp mag and phos - Follow BMP.  Vomiting - non-bloody emesis - Zofran PRN, QTC acceptable.  - Encourage PO as tolerated. Clear liquids.   Hx neurosarcoidosis. - Continue home Prednisone and Gabapentin. - Monitor for relative AI with some borderline BPs, now improving.   Hx DM - now likely with HHS > improved.  - Transition off insulin infusion to SSI/basal - Lantus 18 units at home. Will restart at half home dose as she starts taking POs again.  Hx HTN, HLD. - Hold home Lopressor, Lipitor.  Hx OSA on CPAP. - Continue home CPAP.  If remains stable this morning can likely transfer out of ICU this afternoon.   Best practice (evaluated daily):  Diet:  NPO Pain/Anxiety/Delirium protocol  (if indicated): No VAP protocol (if indicated): Not indicated DVT prophylaxis: Contraindicated GI prophylaxis: N/A Glucose control:  Insulin gtt Central venous access:  N/A Arterial line:  N/A Foley:  N/A Mobility:  bed rest  PT consulted: N/A Last date of multidisciplinary goals of care discussion:  Code Status:  full code Disposition: ICU   Critical care time:     Georgann Housekeeper, AGACNP-BC [place] for personal pager PCCM on call pager [phone] until 7pm. Please call [facility] 7p-7a. [phone]  08/30/2020 7:38 AM

## 2020-08-30 NOTE — Progress Notes (Signed)
Patient removed CPAP- requested crackers and peanut butter and hot tea. After patient ready to lay back down and go to sleep- reminded her she is supposed to wear her CPAP. Patient states she doesn't want it because it doesn't help.I reminded her that she has sleep apnea- but patient persisted in her refusal. Will monitor for desaturations while patient is asleep.  Milford Cage, RN

## 2020-08-30 NOTE — Progress Notes (Signed)
[facility] Progress Note Patient Name: Rhonda [name] DOB: 12/04/1960 MRN: [id number]   Date of Service  08/30/2020  HPI/Events of Note  Hypotension - BP = 71/55 with MAP = 60. HR = 121. H/H has just been sent. I suspect that she may require additional PRBCs. Last LVEF = 65-70%.  eICU Interventions  Plan: 1. Bolus with 0.9 NaCl 1 liter IV over 1 hour now. 2. Await H/H.      Intervention Category Major Interventions: Hypotension - evaluation and management  Willella Harding [place] 08/30/2020, 1:27 AM

## 2020-08-30 NOTE — Progress Notes (Signed)
Referring Physician(s): Floyd,D  Supervising Physician: Jacqulynn Cadet  Patient Status:  [facility] - In-pt  Chief Complaint:  Lower GI bleed  Subjective: Pt doing ok this am; last BM was around 4 am and maroon colored per nursing; denies sig abd pain,N/V   Allergies: Aspirin and Augmentin [amoxicillin-pot clavulanate]  Medications: Prior to Admission medications   Medication Sig Start Date End Date Taking? Authorizing Provider  aspirin 81 MG EC tablet Take 81 mg by mouth daily. 03/01/20  Yes [provider]  b complex vitamins tablet Take 1 tablet by mouth daily.   Yes [provider]  Calcium Carb-Cholecalciferol (CALCIUM+D3 PO) Take 1 tablet by mouth daily with lunch.   Yes [provider]  FEROSUL 325 (65 Fe) MG tablet Take 325 mg by mouth 2 (two) times daily. 07/27/20  Yes [provider]  fluconazole (DIFLUCAN) 100 MG tablet Take 1 tablet (100 mg total) by mouth daily for 10 days. 08/22/20 09/01/20 Yes Shelly Coss, MD  gabapentin (NEURONTIN) 100 MG capsule Take 100 mg by mouth 3 (three) times daily. 08/01/20  Yes [provider]  glucosamine-chondroitin 500-400 MG tablet Take 1 tablet by mouth 3 (three) times daily. Patient taking differently: Take 1 tablet by mouth 2 (two) times daily. 04/11/15  Yes Funches, Josalyn, MD  insulin glargine (LANTUS) 100 UNIT/ML Solostar Pen Inject 18 Units into the skin daily. 07/27/20  Yes Elgergawy, Silver Huguenin, MD  melatonin 3 MG TABS tablet Take 1 tablet (3 mg total) by mouth at bedtime. Patient taking differently: Take 9 mg by mouth at bedtime. 01/17/20  Yes Regalado, Belkys A, MD  metoprolol tartrate (LOPRESSOR) 25 MG tablet Take 1 tablet (25 mg total) by mouth 2 (two) times daily. 01/17/20  Yes Regalado, Belkys A, MD  Multiple Vitamin (MULTIVITAMIN WITH MINERALS) TABS tablet Take 1 tablet by mouth daily with lunch.    Yes [provider]  Omega-3 Fatty Acids (FISH OIL) 1000 MG CAPS Take  1,000 mg by mouth daily with lunch.    Yes [provider]  omeprazole (PRILOSEC) 40 MG capsule Take 40 mg by mouth daily. 08/25/20  Yes [provider]  polyethylene glycol (MIRALAX / GLYCOLAX) 17 g packet Take 17 g by mouth 2 (two) times daily. Patient taking differently: Take 17 g by mouth daily as needed for mild constipation. 01/17/20  Yes Regalado, Belkys A, MD  predniSONE (DELTASONE) 20 MG tablet Take 40 mg by mouth in the morning and at bedtime. 07/17/20  Yes [provider]  PRESCRIPTION MEDICATION Inhale into the lungs See admin instructions. CPAP- At bedtime   Yes [provider]  atorvastatin (LIPITOR) 80 MG tablet Take 1 tablet (80 mg total) by mouth daily at 6 PM. 05/24/19 01/11/20  Dessa Phi, DO  blood glucose meter kit and supplies KIT Dispense based on patient and insurance preference. Use up to four times daily as directed. 07/27/20   Elgergawy, Silver Huguenin, MD  Insulin Pen Needle 29G X 5MM MISC USE WITH LANTUS 07/27/20   Elgergawy, Silver Huguenin, MD  pantoprazole (PROTONIX) 40 MG tablet Take 1 tablet (40 mg total) by mouth daily. Patient not taking: Reported on 08/30/2020 01/17/20   Niel Hummer A, MD     Vital Signs: BP 130/88   Pulse (!) 114   Temp 98.4 F (36.9 C) (Oral)   Resp (!) 28   Ht $R'5\' 3"'ak$  (1.6 m)   Wt 175 lb 4.3 oz (79.5 kg)   SpO2 97%  BMI 31.05 kg/m   Physical Exam awake, answering questions ok; access site rt CFA soft, NT, no hematoma; intact distal pulses; abd soft,NT  Imaging: IR Angiogram Visceral Selective  Result Date: 08/30/2020 INDICATION: Persistent lower GI bleeding. Positive CTA. Please perform mesenteric arteriogram with potential percutaneous embolization. EXAM: 1. ULTRASOUND GUIDANCE FOR ARTERIAL ACCESS 2. SELECTIVE SUPERIOR MESENTERIC ARTERIOGRAM 3. SELECTIVE INFERIOR MESENTERIC ARTERIOGRAM 4. SUB SELECTIVE SUPERIOR SIGMOID ARTERIOGRAM 5. SUB SELECTIVE ARTERIOGRAM OF DISTAL ARCADE BRANCH OF THE SUPERIOR SIGMOIDAL  ARTERY AND PERCUTANEOUS COIL EMBOLIZATION COMPARISON:  CTA abdomen and pelvis - earlier same day MEDICATIONS: None ANESTHESIA/SEDATION: Moderate (conscious) sedation was employed during this procedure. A total of Versed 0.5 mg and Fentanyl 25 mcg was administered intravenously. Moderate Sedation Time: 40 minutes. The patient's level of consciousness and vital signs were monitored continuously by radiology nursing throughout the procedure under my direct supervision. CONTRAST:  75 cc Omnipaque 300 FLUOROSCOPY TIME:  14 minutes, 54 seconds (3,500 mGy) COMPLICATIONS: None immediate. PROCEDURE: Informed consent was obtained from the patient and the patient's husband following explanation of the procedure, risks, benefits and alternatives. All questions were addressed. A time out was performed prior to the initiation of the procedure. Maximal barrier sterile technique utilized including caps, mask, sterile gowns, sterile gloves, large sterile drape, hand hygiene, and Betadine prep. The right femoral head was marked fluoroscopically. Under sterile conditions and local anesthesia, the right common femoral artery access was performed with a micropuncture needle. Under direct ultrasound guidance, the right common femoral was accessed with a micropuncture kit. An ultrasound image was saved for documentation purposes. This allowed for placement of a 5-French vascular sheath. A limited arteriogram was performed through the side arm of the sheath confirming appropriate access within the right common femoral artery. Over a Bentson wire, a Mickelson catheter was advanced the caudal aspect of the thoracic aorta where was reformed, back bled and flushed. The Mickelson catheter was then utilized to select the superior mesenteric artery and a selective superior mesenteric arteriogram was performed. Next, the Mickelson catheter was utilized to select the IMA and a selective inferior mesenteric arteriogram was performed With the use of  a fathom 14 microwire, a Renegade microcatheter was utilized to select the superior sigmoid artery and a sub selective arteriogram was performed. The microcatheter was advanced further to select the distal arcade branch of the superior sigmoidal artery supplying the obvious area of intraluminal extravasation within the sigmoid colon, correlating with the findings seen on preceding CTA. Sub selective injection was performed and the vessel was percutaneously coil embolized with 2 overlapping 2 mm diameter interlock coils. The microcatheter was retracted to the level of the superior sigmoid artery and post embolization arteriogram was performed. The microcatheter was then removed and a completion inferior mesenteric arteriogram was performed from the level of the Mickelson catheter. Images reviewed and the procedure was terminated. All wires, catheters and sheaths were removed from the patient. Hemostasis was achieved at the right groin access site with deployment of an ExoSeal closure device. The patient tolerated the procedure well without immediate post procedural complication. FINDINGS: Selective superior mesenteric arteriogram demonstrates conventional branching pattern and was negative for discrete area of vessel irregularity or contrast extravasation Selective inferior mesenteric arteriogram demonstrates an obvious area of contrast extravasation involving the superior aspect of the sigmoid colon at the level of the left ilium, correlating with the ill-defined area of intraluminal contrast extravasation on preceding CTA (representative image 134, series 5). Sub selective arteriogram of the superior sigmoidal artery demonstrates  a distal arcade branch supplying the ill-defined area of intraluminal contrast extravasation. The proximal aspect of this distal arcade was cannulated and subsequent percutaneously coil embolized with interlock coils. Post embolization arteriograms performed from the level of the superior  sigmoidal artery as well as the inferior mesenteric artery were negative for residual area of intraluminal contrast extravasation or vessel irregularity. IMPRESSION: Technically successful percutaneous coil embolization of a distal arcade branch of the superior sigmoidal artery supplying an obvious area of intraluminal contrast extravasation within the proximal sigmoid colon correlating with the findings on preceding CTA. PLAN: - The patient is to remain flat for 4 hours with right leg straight. - The patient will continue to experience several additional bloody bowel movements and may continue to require additional resuscitation (as he was bleeding both before and during the procedure), however ultimately I am hopeful he will stabilize in the coming days. - If there remains clinical concern for persistent lower GI bleeding, would recommend coordinating with the interventional radiology service to perform a nuclear medicine tagged red blood cell study during daytime working hours as to expedite potential repeat angiography if the tagged study is positive. - While presumably secondary to diverticular disease, repeat colonoscopy after the resolution of acute symptoms is advised to exclude the presence of an underlying mass/lesion. Electronically Signed   By: Sandi Mariscal M.D.   On: 08/30/2020 08:40   IR Angiogram Visceral Selective  Result Date: 08/30/2020 INDICATION: Persistent lower GI bleeding. Positive CTA. Please perform mesenteric arteriogram with potential percutaneous embolization. EXAM: 1. ULTRASOUND GUIDANCE FOR ARTERIAL ACCESS 2. SELECTIVE SUPERIOR MESENTERIC ARTERIOGRAM 3. SELECTIVE INFERIOR MESENTERIC ARTERIOGRAM 4. SUB SELECTIVE SUPERIOR SIGMOID ARTERIOGRAM 5. SUB SELECTIVE ARTERIOGRAM OF DISTAL ARCADE BRANCH OF THE SUPERIOR SIGMOIDAL ARTERY AND PERCUTANEOUS COIL EMBOLIZATION COMPARISON:  CTA abdomen and pelvis - earlier same day MEDICATIONS: None ANESTHESIA/SEDATION: Moderate (conscious) sedation was  employed during this procedure. A total of Versed 0.5 mg and Fentanyl 25 mcg was administered intravenously. Moderate Sedation Time: 40 minutes. The patient's level of consciousness and vital signs were monitored continuously by radiology nursing throughout the procedure under my direct supervision. CONTRAST:  75 cc Omnipaque 300 FLUOROSCOPY TIME:  14 minutes, 54 seconds (9,417 mGy) COMPLICATIONS: None immediate. PROCEDURE: Informed consent was obtained from the patient and the patient's husband following explanation of the procedure, risks, benefits and alternatives. All questions were addressed. A time out was performed prior to the initiation of the procedure. Maximal barrier sterile technique utilized including caps, mask, sterile gowns, sterile gloves, large sterile drape, hand hygiene, and Betadine prep. The right femoral head was marked fluoroscopically. Under sterile conditions and local anesthesia, the right common femoral artery access was performed with a micropuncture needle. Under direct ultrasound guidance, the right common femoral was accessed with a micropuncture kit. An ultrasound image was saved for documentation purposes. This allowed for placement of a 5-French vascular sheath. A limited arteriogram was performed through the side arm of the sheath confirming appropriate access within the right common femoral artery. Over a Bentson wire, a Mickelson catheter was advanced the caudal aspect of the thoracic aorta where was reformed, back bled and flushed. The Mickelson catheter was then utilized to select the superior mesenteric artery and a selective superior mesenteric arteriogram was performed. Next, the Mickelson catheter was utilized to select the IMA and a selective inferior mesenteric arteriogram was performed With the use of a fathom 14 microwire, a Renegade microcatheter was utilized to select the superior sigmoid artery and a sub selective arteriogram  was performed. The microcatheter was  advanced further to select the distal arcade branch of the superior sigmoidal artery supplying the obvious area of intraluminal extravasation within the sigmoid colon, correlating with the findings seen on preceding CTA. Sub selective injection was performed and the vessel was percutaneously coil embolized with 2 overlapping 2 mm diameter interlock coils. The microcatheter was retracted to the level of the superior sigmoid artery and post embolization arteriogram was performed. The microcatheter was then removed and a completion inferior mesenteric arteriogram was performed from the level of the Mickelson catheter. Images reviewed and the procedure was terminated. All wires, catheters and sheaths were removed from the patient. Hemostasis was achieved at the right groin access site with deployment of an ExoSeal closure device. The patient tolerated the procedure well without immediate post procedural complication. FINDINGS: Selective superior mesenteric arteriogram demonstrates conventional branching pattern and was negative for discrete area of vessel irregularity or contrast extravasation Selective inferior mesenteric arteriogram demonstrates an obvious area of contrast extravasation involving the superior aspect of the sigmoid colon at the level of the left ilium, correlating with the ill-defined area of intraluminal contrast extravasation on preceding CTA (representative image 134, series 5). Sub selective arteriogram of the superior sigmoidal artery demonstrates a distal arcade branch supplying the ill-defined area of intraluminal contrast extravasation. The proximal aspect of this distal arcade was cannulated and subsequent percutaneously coil embolized with interlock coils. Post embolization arteriograms performed from the level of the superior sigmoidal artery as well as the inferior mesenteric artery were negative for residual area of intraluminal contrast extravasation or vessel irregularity. IMPRESSION:  Technically successful percutaneous coil embolization of a distal arcade branch of the superior sigmoidal artery supplying an obvious area of intraluminal contrast extravasation within the proximal sigmoid colon correlating with the findings on preceding CTA. PLAN: - The patient is to remain flat for 4 hours with right leg straight. - The patient will continue to experience several additional bloody bowel movements and may continue to require additional resuscitation (as he was bleeding both before and during the procedure), however ultimately I am hopeful he will stabilize in the coming days. - If there remains clinical concern for persistent lower GI bleeding, would recommend coordinating with the interventional radiology service to perform a nuclear medicine tagged red blood cell study during daytime working hours as to expedite potential repeat angiography if the tagged study is positive. - While presumably secondary to diverticular disease, repeat colonoscopy after the resolution of acute symptoms is advised to exclude the presence of an underlying mass/lesion. Electronically Signed   By: Sandi Mariscal M.D.   On: 08/30/2020 08:40   IR Angiogram Selective Each Additional Vessel  Result Date: 08/30/2020 INDICATION: Persistent lower GI bleeding. Positive CTA. Please perform mesenteric arteriogram with potential percutaneous embolization. EXAM: 1. ULTRASOUND GUIDANCE FOR ARTERIAL ACCESS 2. SELECTIVE SUPERIOR MESENTERIC ARTERIOGRAM 3. SELECTIVE INFERIOR MESENTERIC ARTERIOGRAM 4. SUB SELECTIVE SUPERIOR SIGMOID ARTERIOGRAM 5. SUB SELECTIVE ARTERIOGRAM OF DISTAL ARCADE BRANCH OF THE SUPERIOR SIGMOIDAL ARTERY AND PERCUTANEOUS COIL EMBOLIZATION COMPARISON:  CTA abdomen and pelvis - earlier same day MEDICATIONS: None ANESTHESIA/SEDATION: Moderate (conscious) sedation was employed during this procedure. A total of Versed 0.5 mg and Fentanyl 25 mcg was administered intravenously. Moderate Sedation Time: 40 minutes. The  patient's level of consciousness and vital signs were monitored continuously by radiology nursing throughout the procedure under my direct supervision. CONTRAST:  75 cc Omnipaque 300 FLUOROSCOPY TIME:  14 minutes, 54 seconds (4,010 mGy) COMPLICATIONS: None immediate. PROCEDURE: Informed consent was  obtained from the patient and the patient's husband following explanation of the procedure, risks, benefits and alternatives. All questions were addressed. A time out was performed prior to the initiation of the procedure. Maximal barrier sterile technique utilized including caps, mask, sterile gowns, sterile gloves, large sterile drape, hand hygiene, and Betadine prep. The right femoral head was marked fluoroscopically. Under sterile conditions and local anesthesia, the right common femoral artery access was performed with a micropuncture needle. Under direct ultrasound guidance, the right common femoral was accessed with a micropuncture kit. An ultrasound image was saved for documentation purposes. This allowed for placement of a 5-French vascular sheath. A limited arteriogram was performed through the side arm of the sheath confirming appropriate access within the right common femoral artery. Over a Bentson wire, a Mickelson catheter was advanced the caudal aspect of the thoracic aorta where was reformed, back bled and flushed. The Mickelson catheter was then utilized to select the superior mesenteric artery and a selective superior mesenteric arteriogram was performed. Next, the Mickelson catheter was utilized to select the IMA and a selective inferior mesenteric arteriogram was performed With the use of a fathom 14 microwire, a Renegade microcatheter was utilized to select the superior sigmoid artery and a sub selective arteriogram was performed. The microcatheter was advanced further to select the distal arcade branch of the superior sigmoidal artery supplying the obvious area of intraluminal extravasation within  the sigmoid colon, correlating with the findings seen on preceding CTA. Sub selective injection was performed and the vessel was percutaneously coil embolized with 2 overlapping 2 mm diameter interlock coils. The microcatheter was retracted to the level of the superior sigmoid artery and post embolization arteriogram was performed. The microcatheter was then removed and a completion inferior mesenteric arteriogram was performed from the level of the Mickelson catheter. Images reviewed and the procedure was terminated. All wires, catheters and sheaths were removed from the patient. Hemostasis was achieved at the right groin access site with deployment of an ExoSeal closure device. The patient tolerated the procedure well without immediate post procedural complication. FINDINGS: Selective superior mesenteric arteriogram demonstrates conventional branching pattern and was negative for discrete area of vessel irregularity or contrast extravasation Selective inferior mesenteric arteriogram demonstrates an obvious area of contrast extravasation involving the superior aspect of the sigmoid colon at the level of the left ilium, correlating with the ill-defined area of intraluminal contrast extravasation on preceding CTA (representative image 134, series 5). Sub selective arteriogram of the superior sigmoidal artery demonstrates a distal arcade branch supplying the ill-defined area of intraluminal contrast extravasation. The proximal aspect of this distal arcade was cannulated and subsequent percutaneously coil embolized with interlock coils. Post embolization arteriograms performed from the level of the superior sigmoidal artery as well as the inferior mesenteric artery were negative for residual area of intraluminal contrast extravasation or vessel irregularity. IMPRESSION: Technically successful percutaneous coil embolization of a distal arcade branch of the superior sigmoidal artery supplying an obvious area of  intraluminal contrast extravasation within the proximal sigmoid colon correlating with the findings on preceding CTA. PLAN: - The patient is to remain flat for 4 hours with right leg straight. - The patient will continue to experience several additional bloody bowel movements and may continue to require additional resuscitation (as he was bleeding both before and during the procedure), however ultimately I am hopeful he will stabilize in the coming days. - If there remains clinical concern for persistent lower GI bleeding, would recommend coordinating with the interventional radiology service  to perform a nuclear medicine tagged red blood cell study during daytime working hours as to expedite potential repeat angiography if the tagged study is positive. - While presumably secondary to diverticular disease, repeat colonoscopy after the resolution of acute symptoms is advised to exclude the presence of an underlying mass/lesion. Electronically Signed   By: Sandi Mariscal M.D.   On: 08/30/2020 08:40   IR Angiogram Follow Up Study  Result Date: 08/30/2020 INDICATION: Persistent lower GI bleeding. Positive CTA. Please perform mesenteric arteriogram with potential percutaneous embolization. EXAM: 1. ULTRASOUND GUIDANCE FOR ARTERIAL ACCESS 2. SELECTIVE SUPERIOR MESENTERIC ARTERIOGRAM 3. SELECTIVE INFERIOR MESENTERIC ARTERIOGRAM 4. SUB SELECTIVE SUPERIOR SIGMOID ARTERIOGRAM 5. SUB SELECTIVE ARTERIOGRAM OF DISTAL ARCADE BRANCH OF THE SUPERIOR SIGMOIDAL ARTERY AND PERCUTANEOUS COIL EMBOLIZATION COMPARISON:  CTA abdomen and pelvis - earlier same day MEDICATIONS: None ANESTHESIA/SEDATION: Moderate (conscious) sedation was employed during this procedure. A total of Versed 0.5 mg and Fentanyl 25 mcg was administered intravenously. Moderate Sedation Time: 40 minutes. The patient's level of consciousness and vital signs were monitored continuously by radiology nursing throughout the procedure under my direct supervision. CONTRAST:   75 cc Omnipaque 300 FLUOROSCOPY TIME:  14 minutes, 54 seconds (0,272 mGy) COMPLICATIONS: None immediate. PROCEDURE: Informed consent was obtained from the patient and the patient's husband following explanation of the procedure, risks, benefits and alternatives. All questions were addressed. A time out was performed prior to the initiation of the procedure. Maximal barrier sterile technique utilized including caps, mask, sterile gowns, sterile gloves, large sterile drape, hand hygiene, and Betadine prep. The right femoral head was marked fluoroscopically. Under sterile conditions and local anesthesia, the right common femoral artery access was performed with a micropuncture needle. Under direct ultrasound guidance, the right common femoral was accessed with a micropuncture kit. An ultrasound image was saved for documentation purposes. This allowed for placement of a 5-French vascular sheath. A limited arteriogram was performed through the side arm of the sheath confirming appropriate access within the right common femoral artery. Over a Bentson wire, a Mickelson catheter was advanced the caudal aspect of the thoracic aorta where was reformed, back bled and flushed. The Mickelson catheter was then utilized to select the superior mesenteric artery and a selective superior mesenteric arteriogram was performed. Next, the Mickelson catheter was utilized to select the IMA and a selective inferior mesenteric arteriogram was performed With the use of a fathom 14 microwire, a Renegade microcatheter was utilized to select the superior sigmoid artery and a sub selective arteriogram was performed. The microcatheter was advanced further to select the distal arcade branch of the superior sigmoidal artery supplying the obvious area of intraluminal extravasation within the sigmoid colon, correlating with the findings seen on preceding CTA. Sub selective injection was performed and the vessel was percutaneously coil embolized with  2 overlapping 2 mm diameter interlock coils. The microcatheter was retracted to the level of the superior sigmoid artery and post embolization arteriogram was performed. The microcatheter was then removed and a completion inferior mesenteric arteriogram was performed from the level of the Mickelson catheter. Images reviewed and the procedure was terminated. All wires, catheters and sheaths were removed from the patient. Hemostasis was achieved at the right groin access site with deployment of an ExoSeal closure device. The patient tolerated the procedure well without immediate post procedural complication. FINDINGS: Selective superior mesenteric arteriogram demonstrates conventional branching pattern and was negative for discrete area of vessel irregularity or contrast extravasation Selective inferior mesenteric arteriogram demonstrates an obvious area of contrast  extravasation involving the superior aspect of the sigmoid colon at the level of the left ilium, correlating with the ill-defined area of intraluminal contrast extravasation on preceding CTA (representative image 134, series 5). Sub selective arteriogram of the superior sigmoidal artery demonstrates a distal arcade branch supplying the ill-defined area of intraluminal contrast extravasation. The proximal aspect of this distal arcade was cannulated and subsequent percutaneously coil embolized with interlock coils. Post embolization arteriograms performed from the level of the superior sigmoidal artery as well as the inferior mesenteric artery were negative for residual area of intraluminal contrast extravasation or vessel irregularity. IMPRESSION: Technically successful percutaneous coil embolization of a distal arcade branch of the superior sigmoidal artery supplying an obvious area of intraluminal contrast extravasation within the proximal sigmoid colon correlating with the findings on preceding CTA. PLAN: - The patient is to remain flat for 4 hours with  right leg straight. - The patient will continue to experience several additional bloody bowel movements and may continue to require additional resuscitation (as he was bleeding both before and during the procedure), however ultimately I am hopeful he will stabilize in the coming days. - If there remains clinical concern for persistent lower GI bleeding, would recommend coordinating with the interventional radiology service to perform a nuclear medicine tagged red blood cell study during daytime working hours as to expedite potential repeat angiography if the tagged study is positive. - While presumably secondary to diverticular disease, repeat colonoscopy after the resolution of acute symptoms is advised to exclude the presence of an underlying mass/lesion. Electronically Signed   By: Sandi Mariscal M.D.   On: 08/30/2020 08:40   IR US Guide Vasc Access Right  Result Date: 08/30/2020 INDICATION: Persistent lower GI bleeding. Positive CTA. Please perform mesenteric arteriogram with potential percutaneous embolization. EXAM: 1. ULTRASOUND GUIDANCE FOR ARTERIAL ACCESS 2. SELECTIVE SUPERIOR MESENTERIC ARTERIOGRAM 3. SELECTIVE INFERIOR MESENTERIC ARTERIOGRAM 4. SUB SELECTIVE SUPERIOR SIGMOID ARTERIOGRAM 5. SUB SELECTIVE ARTERIOGRAM OF DISTAL ARCADE BRANCH OF THE SUPERIOR SIGMOIDAL ARTERY AND PERCUTANEOUS COIL EMBOLIZATION COMPARISON:  CTA abdomen and pelvis - earlier same day MEDICATIONS: None ANESTHESIA/SEDATION: Moderate (conscious) sedation was employed during this procedure. A total of Versed 0.5 mg and Fentanyl 25 mcg was administered intravenously. Moderate Sedation Time: 40 minutes. The patient's level of consciousness and vital signs were monitored continuously by radiology nursing throughout the procedure under my direct supervision. CONTRAST:  75 cc Omnipaque 300 FLUOROSCOPY TIME:  14 minutes, 54 seconds (3,817 mGy) COMPLICATIONS: None immediate. PROCEDURE: Informed consent was obtained from the patient and the  patient's husband following explanation of the procedure, risks, benefits and alternatives. All questions were addressed. A time out was performed prior to the initiation of the procedure. Maximal barrier sterile technique utilized including caps, mask, sterile gowns, sterile gloves, large sterile drape, hand hygiene, and Betadine prep. The right femoral head was marked fluoroscopically. Under sterile conditions and local anesthesia, the right common femoral artery access was performed with a micropuncture needle. Under direct ultrasound guidance, the right common femoral was accessed with a micropuncture kit. An ultrasound image was saved for documentation purposes. This allowed for placement of a 5-French vascular sheath. A limited arteriogram was performed through the side arm of the sheath confirming appropriate access within the right common femoral artery. Over a Bentson wire, a Mickelson catheter was advanced the caudal aspect of the thoracic aorta where was reformed, back bled and flushed. The Mickelson catheter was then utilized to select the superior mesenteric artery and a selective superior mesenteric arteriogram  was performed. Next, the Mickelson catheter was utilized to select the IMA and a selective inferior mesenteric arteriogram was performed With the use of a fathom 14 microwire, a Renegade microcatheter was utilized to select the superior sigmoid artery and a sub selective arteriogram was performed. The microcatheter was advanced further to select the distal arcade branch of the superior sigmoidal artery supplying the obvious area of intraluminal extravasation within the sigmoid colon, correlating with the findings seen on preceding CTA. Sub selective injection was performed and the vessel was percutaneously coil embolized with 2 overlapping 2 mm diameter interlock coils. The microcatheter was retracted to the level of the superior sigmoid artery and post embolization arteriogram was performed.  The microcatheter was then removed and a completion inferior mesenteric arteriogram was performed from the level of the Mickelson catheter. Images reviewed and the procedure was terminated. All wires, catheters and sheaths were removed from the patient. Hemostasis was achieved at the right groin access site with deployment of an ExoSeal closure device. The patient tolerated the procedure well without immediate post procedural complication. FINDINGS: Selective superior mesenteric arteriogram demonstrates conventional branching pattern and was negative for discrete area of vessel irregularity or contrast extravasation Selective inferior mesenteric arteriogram demonstrates an obvious area of contrast extravasation involving the superior aspect of the sigmoid colon at the level of the left ilium, correlating with the ill-defined area of intraluminal contrast extravasation on preceding CTA (representative image 134, series 5). Sub selective arteriogram of the superior sigmoidal artery demonstrates a distal arcade branch supplying the ill-defined area of intraluminal contrast extravasation. The proximal aspect of this distal arcade was cannulated and subsequent percutaneously coil embolized with interlock coils. Post embolization arteriograms performed from the level of the superior sigmoidal artery as well as the inferior mesenteric artery were negative for residual area of intraluminal contrast extravasation or vessel irregularity. IMPRESSION: Technically successful percutaneous coil embolization of a distal arcade branch of the superior sigmoidal artery supplying an obvious area of intraluminal contrast extravasation within the proximal sigmoid colon correlating with the findings on preceding CTA. PLAN: - The patient is to remain flat for 4 hours with right leg straight. - The patient will continue to experience several additional bloody bowel movements and may continue to require additional resuscitation (as he was  bleeding both before and during the procedure), however ultimately I am hopeful he will stabilize in the coming days. - If there remains clinical concern for persistent lower GI bleeding, would recommend coordinating with the interventional radiology service to perform a nuclear medicine tagged red blood cell study during daytime working hours as to expedite potential repeat angiography if the tagged study is positive. - While presumably secondary to diverticular disease, repeat colonoscopy after the resolution of acute symptoms is advised to exclude the presence of an underlying mass/lesion. Electronically Signed   By: Sandi Mariscal M.D.   On: 08/30/2020 08:40   DG Chest Port 1 View  Result Date: 08/30/2020 CLINICAL DATA:  Respiratory failure EXAM: PORTABLE CHEST 1 VIEW COMPARISON:  01/14/2020 FINDINGS: The heart size and mediastinal contours are within normal limits. Both lungs are clear. The visualized skeletal structures are unremarkable. IMPRESSION: No active disease. Electronically Signed   By: Fidela Salisbury MD   On: 08/30/2020 05:17   IR EMBO ART  VEN HEMORR LYMPH EXTRAV  INC GUIDE ROADMAPPING  Result Date: 08/30/2020 INDICATION: Persistent lower GI bleeding. Positive CTA. Please perform mesenteric arteriogram with potential percutaneous embolization. EXAM: 1. ULTRASOUND GUIDANCE FOR ARTERIAL ACCESS 2. SELECTIVE SUPERIOR  MESENTERIC ARTERIOGRAM 3. SELECTIVE INFERIOR MESENTERIC ARTERIOGRAM 4. SUB SELECTIVE SUPERIOR SIGMOID ARTERIOGRAM 5. SUB SELECTIVE ARTERIOGRAM OF DISTAL ARCADE BRANCH OF THE SUPERIOR SIGMOIDAL ARTERY AND PERCUTANEOUS COIL EMBOLIZATION COMPARISON:  CTA abdomen and pelvis - earlier same day MEDICATIONS: None ANESTHESIA/SEDATION: Moderate (conscious) sedation was employed during this procedure. A total of Versed 0.5 mg and Fentanyl 25 mcg was administered intravenously. Moderate Sedation Time: 40 minutes. The patient's level of consciousness and vital signs were monitored continuously by  radiology nursing throughout the procedure under my direct supervision. CONTRAST:  75 cc Omnipaque 300 FLUOROSCOPY TIME:  14 minutes, 54 seconds (5,638 mGy) COMPLICATIONS: None immediate. PROCEDURE: Informed consent was obtained from the patient and the patient's husband following explanation of the procedure, risks, benefits and alternatives. All questions were addressed. A time out was performed prior to the initiation of the procedure. Maximal barrier sterile technique utilized including caps, mask, sterile gowns, sterile gloves, large sterile drape, hand hygiene, and Betadine prep. The right femoral head was marked fluoroscopically. Under sterile conditions and local anesthesia, the right common femoral artery access was performed with a micropuncture needle. Under direct ultrasound guidance, the right common femoral was accessed with a micropuncture kit. An ultrasound image was saved for documentation purposes. This allowed for placement of a 5-French vascular sheath. A limited arteriogram was performed through the side arm of the sheath confirming appropriate access within the right common femoral artery. Over a Bentson wire, a Mickelson catheter was advanced the caudal aspect of the thoracic aorta where was reformed, back bled and flushed. The Mickelson catheter was then utilized to select the superior mesenteric artery and a selective superior mesenteric arteriogram was performed. Next, the Mickelson catheter was utilized to select the IMA and a selective inferior mesenteric arteriogram was performed With the use of a fathom 14 microwire, a Renegade microcatheter was utilized to select the superior sigmoid artery and a sub selective arteriogram was performed. The microcatheter was advanced further to select the distal arcade branch of the superior sigmoidal artery supplying the obvious area of intraluminal extravasation within the sigmoid colon, correlating with the findings seen on preceding CTA. Sub  selective injection was performed and the vessel was percutaneously coil embolized with 2 overlapping 2 mm diameter interlock coils. The microcatheter was retracted to the level of the superior sigmoid artery and post embolization arteriogram was performed. The microcatheter was then removed and a completion inferior mesenteric arteriogram was performed from the level of the Mickelson catheter. Images reviewed and the procedure was terminated. All wires, catheters and sheaths were removed from the patient. Hemostasis was achieved at the right groin access site with deployment of an ExoSeal closure device. The patient tolerated the procedure well without immediate post procedural complication. FINDINGS: Selective superior mesenteric arteriogram demonstrates conventional branching pattern and was negative for discrete area of vessel irregularity or contrast extravasation Selective inferior mesenteric arteriogram demonstrates an obvious area of contrast extravasation involving the superior aspect of the sigmoid colon at the level of the left ilium, correlating with the ill-defined area of intraluminal contrast extravasation on preceding CTA (representative image 134, series 5). Sub selective arteriogram of the superior sigmoidal artery demonstrates a distal arcade branch supplying the ill-defined area of intraluminal contrast extravasation. The proximal aspect of this distal arcade was cannulated and subsequent percutaneously coil embolized with interlock coils. Post embolization arteriograms performed from the level of the superior sigmoidal artery as well as the inferior mesenteric artery were negative for residual area of intraluminal contrast extravasation or vessel  irregularity. IMPRESSION: Technically successful percutaneous coil embolization of a distal arcade branch of the superior sigmoidal artery supplying an obvious area of intraluminal contrast extravasation within the proximal sigmoid colon correlating  with the findings on preceding CTA. PLAN: - The patient is to remain flat for 4 hours with right leg straight. - The patient will continue to experience several additional bloody bowel movements and may continue to require additional resuscitation (as he was bleeding both before and during the procedure), however ultimately I am hopeful he will stabilize in the coming days. - If there remains clinical concern for persistent lower GI bleeding, would recommend coordinating with the interventional radiology service to perform a nuclear medicine tagged red blood cell study during daytime working hours as to expedite potential repeat angiography if the tagged study is positive. - While presumably secondary to diverticular disease, repeat colonoscopy after the resolution of acute symptoms is advised to exclude the presence of an underlying mass/lesion. Electronically Signed   By: Sandi Mariscal M.D.   On: 08/30/2020 08:40   CT Angio Abd/Pel W and/or Wo Contrast  Result Date: 08/29/2020 CLINICAL DATA:  Gastrointestinal bleeding. EXAM: CTA ABDOMEN AND PELVIS WITHOUT AND WITH CONTRAST TECHNIQUE: Multidetector CT imaging of the abdomen and pelvis was performed using the standard protocol during bolus administration of intravenous contrast. Multiplanar reconstructed images and MIPs were obtained and reviewed to evaluate the vascular anatomy. CONTRAST:  147mL OMNIPAQUE IOHEXOL 350 MG/ML SOLN COMPARISON:  July 23, 2020. FINDINGS: VASCULAR Aorta: Normal caliber aorta without aneurysm, dissection, vasculitis or significant stenosis. Celiac: Patent without evidence of aneurysm, dissection, vasculitis or significant stenosis. SMA: Patent without evidence of aneurysm, dissection, vasculitis or significant stenosis. Renals: Both renal arteries are patent without evidence of aneurysm, dissection, vasculitis, fibromuscular dysplasia or significant stenosis. IMA: Patent without evidence of aneurysm, dissection, vasculitis or significant  stenosis. Inflow: Patent without evidence of aneurysm, dissection, vasculitis or significant stenosis. Proximal Outflow: Bilateral common femoral and visualized portions of the superficial and profunda femoral arteries are patent without evidence of aneurysm, dissection, vasculitis or significant stenosis. Veins: No obvious venous abnormality within the limitations of this arterial phase study. Review of the MIP images confirms the above findings. NON-VASCULAR Lower chest: No acute abnormality. Hepatobiliary: No focal liver abnormality is seen. No gallstones, gallbladder wall thickening, or biliary dilatation. Pancreas: Unremarkable. No pancreatic ductal dilatation or surrounding inflammatory changes. Spleen: Normal in size without focal abnormality. Adrenals/Urinary Tract: Adrenal glands are unremarkable. Kidneys are normal, without renal calculi, focal lesion, or hydronephrosis. Bladder is unremarkable. Stomach/Bowel: The stomach is unremarkable. Diverticulosis of descending and sigmoid colon is noted. Is no evidence of bowel obstruction or inflammation. However, there does appear to be some degree of contrast extravasation involving the distal descending colon concerning for active gastrointestinal hemorrhage. This is best seen on image number 141 of series 6. Lymphatic: No significant adenopathy is noted. Reproductive: Status post hysterectomy. No adnexal masses. Other: No abdominal wall hernia or abnormality. No abdominopelvic ascites. Musculoskeletal: No fracture is seen. IMPRESSION: VASCULAR Contrast extravasation is seen involving the distal descending colon concerning for active gastrointestinal hemorrhage. Critical Value/emergent results were called by telephone at the time of interpretation on 08/29/2020 at 9:37 pm to provider [facility] , who verbally acknowledged these results. There is no evidence of abdominal aortic dissection or aneurysm. No definite mesenteric or renal artery stenosis is noted.  NON-VASCULAR Sigmoid diverticulosis without inflammation. Aortic Atherosclerosis (ICD10-I70.0). Electronically Signed   By: Marijo Conception M.D.   On: 08/29/2020 21:37  Labs:  CBC: Recent Labs    08/21/20 1653 08/22/20 0626 08/29/20 2003 08/30/20 0118 08/30/20 0600  WBC 8.8 9.1 9.0  --  17.3*  HGB 8.9* 9.9* 8.1* 9.3* 8.9*  HCT 27.9* 31.3* 26.3* 28.8* 27.4*  PLT 202 209 216  --  157    COAGS: Recent Labs    11/13/19 1620 01/07/20 2235 07/23/20 0347 08/29/20 2003  INR 1.0 1.0 1.0 1.1  APTT 29 24  --   --     BMP: Recent Labs    01/16/20 2014 01/16/20 2314 01/17/20 0259 01/17/20 0546 05/06/20 0109 08/20/20 0910 08/21/20 0408 08/29/20 2003 08/30/20 0600  NA 127* 128* 127* 128*   < > 130* 132* 124* 136  K 4.5 4.6 4.5 4.6   < > 4.3 4.2 4.8 4.1  CL 94* 94* 92* 94*   < > 93* 94* 89* 99  CO2 $Re'25 24 27 25   'AFP$ < > $R'25 30 23 31  'SY$ GLUCOSE 110* 185* 207* 164*   < > 385* 241* 616* 146*  BUN $Re'11 11 12 12   'kPV$ < > $R'8 7 10 11  'He$ CALCIUM 8.2* 8.2* 8.1* 8.3*   < > 8.7* 8.6* 8.1* 8.4*  CREATININE 0.61 0.73 0.82 0.66   < > 0.67 0.52 1.04* 0.82  GFRNONAA >60 >60 >60 >60   < > >60 >60 >60 >60  GFRAA >60 >60 >60 >60  --   --   --   --   --    < > = values in this interval not displayed.    LIVER FUNCTION TESTS: Recent Labs    06/18/20 0006 07/23/20 0213 08/06/20 1003 08/29/20 2003  BILITOT 0.8 0.9 0.4 0.7  AST 38 35 27 37  ALT 65* 50* 57* <5  ALKPHOS 118 122 204* 137*  PROT 5.7* 5.1* 4.9* 4.5*  ALBUMIN 3.3* 2.5* 3.2* 2.6*    Assessment and Plan: Patient with history of acute diverticular lower GI bleed with anemia/hemorrhagic shock, acute renal failure, diabetes, neurosarcoidosis, hypertension, OSA; status post coil embolization of distal arcade branch of the superior sigmoidal artery 4/13; afebrile, BP 130/88, slightly tachy, BBC 17.3, hemoglobin 8.9 down slightly from 9.3, creatinine 0.82; continue to monitor labs closely, hydrate; as per Dr. Pascal Lux recs: If there remains  clinical concern for persistent lower GI bleeding, would recommend coordinating with the interventional radiology service to perform a nuclear medicine tagged red blood cell study during daytime working hours as to expedite potential repeat angiography if the tagged study is positive.  - While presumably secondary to diverticular disease, repeat colonoscopy after the resolution of acute symptoms is advised to exclude the presence of an underlying mass/lesion.   Electronically Signed: D. Rowe Robert, PA-C 08/30/2020, 12:09 PM   I spent a total of 15 minutes at the the patient's bedside AND on the patient's hospital floor or unit, greater than 50% of which was counseling/coordinating care for mesenteric/sigmoid arteriogram with coil embolization    Patient ID: Rhonda [name], female   DOB: [birth year], 60 y.o.   MRN: [id number]

## 2020-08-30 NOTE — Progress Notes (Signed)
0.9 eLink Physician-Brief Progress Note Patient Name: Rhonda [name] DOB: November 24, 1960 MRN: [id number]   Date of Service  08/30/2020  HPI/Events of Note  The 1 liter 0.9 NaCl IV bolus is almost complete. Hypotension improved. BP = 132/85. Hgb = 9.3  eICU Interventions  Continue present management.      Intervention Category Major Interventions: Hypotension - evaluation and management  Dennard Vezina Eugene 08/30/2020, 2:27 AM

## 2020-08-30 NOTE — Progress Notes (Signed)
[facility] Progress Note Patient Name: Rhonda [name] DOB: 10-15-1960 MRN: [id number]   Date of Service  08/30/2020  HPI/Events of Note  Hyperglycemia - Blood glucose = 236. EndoTool prompting for glucose containing fluid. Ketones in urine on admission c/w DKA. K+ = 4.8.  eICU Interventions  Plan: 1. D/C LR IV infusion. 2. D5 0.9 NaCl IV infusion at 100 mL/hour.     Intervention Category Major Interventions: Hyperglycemia - active titration of insulin therapy  Rhonda [name] 08/30/2020, 5:22 AM

## 2020-08-30 NOTE — Progress Notes (Signed)
Subjective: No complaints.  Feeling well.  Objective: Vital signs in last 24 hours: Temp:  [97.6 F (36.4 C)-98.4 F (36.9 C)] 98.4 F (36.9 C) (04/14 0700) Pulse Rate:  [64-151] 114 (04/14 1000) Resp:  [11-29] 28 (04/14 1000) BP: (49-132)/(31-111) 130/88 (04/14 1000) SpO2:  [73 %-100 %] 97 % (04/14 1000) Weight:  [79.5 kg-85 kg] 79.5 kg (04/14 0500) Last BM Date: 08/30/20  Intake/Output from previous day: 04/13 0701 - 04/14 0700 In: 1417.8 [I.V.:417.4; IV Piggyback:1000.4] Out: -  Intake/Output this shift: Total I/O In: 247.6 [I.V.:55.9; IV Piggyback:191.7] Out: -   General appearance: alert and tired appearing Resp: clear to auscultation bilaterally Cardio: regular rate and rhythm GI: soft, non-tender; bowel sounds normal; no masses,  no organomegaly Extremities: extremities normal, atraumatic, no cyanosis or edema  Lab Results: Recent Labs    08/29/20 2003 08/30/20 0118 08/30/20 0600  WBC 9.0  --  17.3*  HGB 8.1* 9.3* 8.9*  HCT 26.3* 28.8* 27.4*  PLT 216  --  157   BMET Recent Labs    08/29/20 2003 08/30/20 0600  NA 124* 136  K 4.8 4.1  CL 89* 99  CO2 23 31  GLUCOSE 616* 146*  BUN 10 11  CREATININE 1.04* 0.82  CALCIUM 8.1* 8.4*   LFT Recent Labs    08/29/20 2003  PROT 4.5*  ALBUMIN 2.6*  AST 37  ALT <5  ALKPHOS 137*  BILITOT 0.7   PT/INR Recent Labs    08/29/20 2003  LABPROT 13.8  INR 1.1   Hepatitis Panel No results for input(s): HEPBSAG, HCVAB, HEPAIGM, HEPBIGM in the last 72 hours. C-Diff No results for input(s): CDIFFTOX in the last 72 hours. Fecal Lactopherrin No results for input(s): FECLLACTOFRN in the last 72 hours.  Studies/Results: IR Angiogram Visceral Selective  Result Date: 08/30/2020 INDICATION: Persistent lower GI bleeding. Positive CTA. Please perform mesenteric arteriogram with potential percutaneous embolization. EXAM: 1. ULTRASOUND GUIDANCE FOR ARTERIAL ACCESS 2. SELECTIVE SUPERIOR MESENTERIC ARTERIOGRAM 3.  SELECTIVE INFERIOR MESENTERIC ARTERIOGRAM 4. SUB SELECTIVE SUPERIOR SIGMOID ARTERIOGRAM 5. SUB SELECTIVE ARTERIOGRAM OF DISTAL ARCADE BRANCH OF THE SUPERIOR SIGMOIDAL ARTERY AND PERCUTANEOUS COIL EMBOLIZATION COMPARISON:  CTA abdomen and pelvis - earlier same day MEDICATIONS: None ANESTHESIA/SEDATION: Moderate (conscious) sedation was employed during this procedure. A total of Versed 0.5 mg and Fentanyl 25 mcg was administered intravenously. Moderate Sedation Time: 40 minutes. The patient's level of consciousness and vital signs were monitored continuously by radiology nursing throughout the procedure under my direct supervision. CONTRAST:  75 cc Omnipaque 300 FLUOROSCOPY TIME:  14 minutes, 54 seconds (7,681 mGy) COMPLICATIONS: None immediate. PROCEDURE: Informed consent was obtained from the patient and the patient's husband following explanation of the procedure, risks, benefits and alternatives. All questions were addressed. A time out was performed prior to the initiation of the procedure. Maximal barrier sterile technique utilized including caps, mask, sterile gowns, sterile gloves, large sterile drape, hand hygiene, and Betadine prep. The right femoral head was marked fluoroscopically. Under sterile conditions and local anesthesia, the right common femoral artery access was performed with a micropuncture needle. Under direct ultrasound guidance, the right common femoral was accessed with a micropuncture kit. An ultrasound image was saved for documentation purposes. This allowed for placement of a 5-French vascular sheath. A limited arteriogram was performed through the side arm of the sheath confirming appropriate access within the right common femoral artery. Over a Bentson wire, a Mickelson catheter was advanced the caudal aspect of the thoracic aorta where was reformed, back  bled and flushed. The Mickelson catheter was then utilized to select the superior mesenteric artery and a selective superior  mesenteric arteriogram was performed. Next, the Mickelson catheter was utilized to select the IMA and a selective inferior mesenteric arteriogram was performed With the use of a fathom 14 microwire, a Renegade microcatheter was utilized to select the superior sigmoid artery and a sub selective arteriogram was performed. The microcatheter was advanced further to select the distal arcade branch of the superior sigmoidal artery supplying the obvious area of intraluminal extravasation within the sigmoid colon, correlating with the findings seen on preceding CTA. Sub selective injection was performed and the vessel was percutaneously coil embolized with 2 overlapping 2 mm diameter interlock coils. The microcatheter was retracted to the level of the superior sigmoid artery and post embolization arteriogram was performed. The microcatheter was then removed and a completion inferior mesenteric arteriogram was performed from the level of the Mickelson catheter. Images reviewed and the procedure was terminated. All wires, catheters and sheaths were removed from the patient. Hemostasis was achieved at the right groin access site with deployment of an ExoSeal closure device. The patient tolerated the procedure well without immediate post procedural complication. FINDINGS: Selective superior mesenteric arteriogram demonstrates conventional branching pattern and was negative for discrete area of vessel irregularity or contrast extravasation Selective inferior mesenteric arteriogram demonstrates an obvious area of contrast extravasation involving the superior aspect of the sigmoid colon at the level of the left ilium, correlating with the ill-defined area of intraluminal contrast extravasation on preceding CTA (representative image 134, series 5). Sub selective arteriogram of the superior sigmoidal artery demonstrates a distal arcade branch supplying the ill-defined area of intraluminal contrast extravasation. The proximal aspect  of this distal arcade was cannulated and subsequent percutaneously coil embolized with interlock coils. Post embolization arteriograms performed from the level of the superior sigmoidal artery as well as the inferior mesenteric artery were negative for residual area of intraluminal contrast extravasation or vessel irregularity. IMPRESSION: Technically successful percutaneous coil embolization of a distal arcade branch of the superior sigmoidal artery supplying an obvious area of intraluminal contrast extravasation within the proximal sigmoid colon correlating with the findings on preceding CTA. PLAN: - The patient is to remain flat for 4 hours with right leg straight. - The patient will continue to experience several additional bloody bowel movements and may continue to require additional resuscitation (as he was bleeding both before and during the procedure), however ultimately I am hopeful he will stabilize in the coming days. - If there remains clinical concern for persistent lower GI bleeding, would recommend coordinating with the interventional radiology service to perform a nuclear medicine tagged red blood cell study during daytime working hours as to expedite potential repeat angiography if the tagged study is positive. - While presumably secondary to diverticular disease, repeat colonoscopy after the resolution of acute symptoms is advised to exclude the presence of an underlying mass/lesion. Electronically Signed   By: Sandi Mariscal M.D.   On: 08/30/2020 08:40   IR Angiogram Visceral Selective  Result Date: 08/30/2020 INDICATION: Persistent lower GI bleeding. Positive CTA. Please perform mesenteric arteriogram with potential percutaneous embolization. EXAM: 1. ULTRASOUND GUIDANCE FOR ARTERIAL ACCESS 2. SELECTIVE SUPERIOR MESENTERIC ARTERIOGRAM 3. SELECTIVE INFERIOR MESENTERIC ARTERIOGRAM 4. SUB SELECTIVE SUPERIOR SIGMOID ARTERIOGRAM 5. SUB SELECTIVE ARTERIOGRAM OF DISTAL ARCADE BRANCH OF THE SUPERIOR  SIGMOIDAL ARTERY AND PERCUTANEOUS COIL EMBOLIZATION COMPARISON:  CTA abdomen and pelvis - earlier same day MEDICATIONS: None ANESTHESIA/SEDATION: Moderate (conscious) sedation was employed during  this procedure. A total of Versed 0.5 mg and Fentanyl 25 mcg was administered intravenously. Moderate Sedation Time: 40 minutes. The patient's level of consciousness and vital signs were monitored continuously by radiology nursing throughout the procedure under my direct supervision. CONTRAST:  75 cc Omnipaque 300 FLUOROSCOPY TIME:  14 minutes, 54 seconds (4,944 mGy) COMPLICATIONS: None immediate. PROCEDURE: Informed consent was obtained from the patient and the patient's husband following explanation of the procedure, risks, benefits and alternatives. All questions were addressed. A time out was performed prior to the initiation of the procedure. Maximal barrier sterile technique utilized including caps, mask, sterile gowns, sterile gloves, large sterile drape, hand hygiene, and Betadine prep. The right femoral head was marked fluoroscopically. Under sterile conditions and local anesthesia, the right common femoral artery access was performed with a micropuncture needle. Under direct ultrasound guidance, the right common femoral was accessed with a micropuncture kit. An ultrasound image was saved for documentation purposes. This allowed for placement of a 5-French vascular sheath. A limited arteriogram was performed through the side arm of the sheath confirming appropriate access within the right common femoral artery. Over a Bentson wire, a Mickelson catheter was advanced the caudal aspect of the thoracic aorta where was reformed, back bled and flushed. The Mickelson catheter was then utilized to select the superior mesenteric artery and a selective superior mesenteric arteriogram was performed. Next, the Mickelson catheter was utilized to select the IMA and a selective inferior mesenteric arteriogram was performed With  the use of a fathom 14 microwire, a Renegade microcatheter was utilized to select the superior sigmoid artery and a sub selective arteriogram was performed. The microcatheter was advanced further to select the distal arcade branch of the superior sigmoidal artery supplying the obvious area of intraluminal extravasation within the sigmoid colon, correlating with the findings seen on preceding CTA. Sub selective injection was performed and the vessel was percutaneously coil embolized with 2 overlapping 2 mm diameter interlock coils. The microcatheter was retracted to the level of the superior sigmoid artery and post embolization arteriogram was performed. The microcatheter was then removed and a completion inferior mesenteric arteriogram was performed from the level of the Mickelson catheter. Images reviewed and the procedure was terminated. All wires, catheters and sheaths were removed from the patient. Hemostasis was achieved at the right groin access site with deployment of an ExoSeal closure device. The patient tolerated the procedure well without immediate post procedural complication. FINDINGS: Selective superior mesenteric arteriogram demonstrates conventional branching pattern and was negative for discrete area of vessel irregularity or contrast extravasation Selective inferior mesenteric arteriogram demonstrates an obvious area of contrast extravasation involving the superior aspect of the sigmoid colon at the level of the left ilium, correlating with the ill-defined area of intraluminal contrast extravasation on preceding CTA (representative image 134, series 5). Sub selective arteriogram of the superior sigmoidal artery demonstrates a distal arcade branch supplying the ill-defined area of intraluminal contrast extravasation. The proximal aspect of this distal arcade was cannulated and subsequent percutaneously coil embolized with interlock coils. Post embolization arteriograms performed from the level of  the superior sigmoidal artery as well as the inferior mesenteric artery were negative for residual area of intraluminal contrast extravasation or vessel irregularity. IMPRESSION: Technically successful percutaneous coil embolization of a distal arcade branch of the superior sigmoidal artery supplying an obvious area of intraluminal contrast extravasation within the proximal sigmoid colon correlating with the findings on preceding CTA. PLAN: - The patient is to remain flat for 4 hours with  right leg straight. - The patient will continue to experience several additional bloody bowel movements and may continue to require additional resuscitation (as he was bleeding both before and during the procedure), however ultimately I am hopeful he will stabilize in the coming days. - If there remains clinical concern for persistent lower GI bleeding, would recommend coordinating with the interventional radiology service to perform a nuclear medicine tagged red blood cell study during daytime working hours as to expedite potential repeat angiography if the tagged study is positive. - While presumably secondary to diverticular disease, repeat colonoscopy after the resolution of acute symptoms is advised to exclude the presence of an underlying mass/lesion. Electronically Signed   By: Sandi Mariscal M.D.   On: 08/30/2020 08:40   IR Angiogram Selective Each Additional Vessel  Result Date: 08/30/2020 INDICATION: Persistent lower GI bleeding. Positive CTA. Please perform mesenteric arteriogram with potential percutaneous embolization. EXAM: 1. ULTRASOUND GUIDANCE FOR ARTERIAL ACCESS 2. SELECTIVE SUPERIOR MESENTERIC ARTERIOGRAM 3. SELECTIVE INFERIOR MESENTERIC ARTERIOGRAM 4. SUB SELECTIVE SUPERIOR SIGMOID ARTERIOGRAM 5. SUB SELECTIVE ARTERIOGRAM OF DISTAL ARCADE BRANCH OF THE SUPERIOR SIGMOIDAL ARTERY AND PERCUTANEOUS COIL EMBOLIZATION COMPARISON:  CTA abdomen and pelvis - earlier same day MEDICATIONS: None ANESTHESIA/SEDATION:  Moderate (conscious) sedation was employed during this procedure. A total of Versed 0.5 mg and Fentanyl 25 mcg was administered intravenously. Moderate Sedation Time: 40 minutes. The patient's level of consciousness and vital signs were monitored continuously by radiology nursing throughout the procedure under my direct supervision. CONTRAST:  75 cc Omnipaque 300 FLUOROSCOPY TIME:  14 minutes, 54 seconds (5,320 mGy) COMPLICATIONS: None immediate. PROCEDURE: Informed consent was obtained from the patient and the patient's husband following explanation of the procedure, risks, benefits and alternatives. All questions were addressed. A time out was performed prior to the initiation of the procedure. Maximal barrier sterile technique utilized including caps, mask, sterile gowns, sterile gloves, large sterile drape, hand hygiene, and Betadine prep. The right femoral head was marked fluoroscopically. Under sterile conditions and local anesthesia, the right common femoral artery access was performed with a micropuncture needle. Under direct ultrasound guidance, the right common femoral was accessed with a micropuncture kit. An ultrasound image was saved for documentation purposes. This allowed for placement of a 5-French vascular sheath. A limited arteriogram was performed through the side arm of the sheath confirming appropriate access within the right common femoral artery. Over a Bentson wire, a Mickelson catheter was advanced the caudal aspect of the thoracic aorta where was reformed, back bled and flushed. The Mickelson catheter was then utilized to select the superior mesenteric artery and a selective superior mesenteric arteriogram was performed. Next, the Mickelson catheter was utilized to select the IMA and a selective inferior mesenteric arteriogram was performed With the use of a fathom 14 microwire, a Renegade microcatheter was utilized to select the superior sigmoid artery and a sub selective arteriogram was  performed. The microcatheter was advanced further to select the distal arcade branch of the superior sigmoidal artery supplying the obvious area of intraluminal extravasation within the sigmoid colon, correlating with the findings seen on preceding CTA. Sub selective injection was performed and the vessel was percutaneously coil embolized with 2 overlapping 2 mm diameter interlock coils. The microcatheter was retracted to the level of the superior sigmoid artery and post embolization arteriogram was performed. The microcatheter was then removed and a completion inferior mesenteric arteriogram was performed from the level of the Mickelson catheter. Images reviewed and the procedure was terminated. All wires, catheters and  sheaths were removed from the patient. Hemostasis was achieved at the right groin access site with deployment of an ExoSeal closure device. The patient tolerated the procedure well without immediate post procedural complication. FINDINGS: Selective superior mesenteric arteriogram demonstrates conventional branching pattern and was negative for discrete area of vessel irregularity or contrast extravasation Selective inferior mesenteric arteriogram demonstrates an obvious area of contrast extravasation involving the superior aspect of the sigmoid colon at the level of the left ilium, correlating with the ill-defined area of intraluminal contrast extravasation on preceding CTA (representative image 134, series 5). Sub selective arteriogram of the superior sigmoidal artery demonstrates a distal arcade branch supplying the ill-defined area of intraluminal contrast extravasation. The proximal aspect of this distal arcade was cannulated and subsequent percutaneously coil embolized with interlock coils. Post embolization arteriograms performed from the level of the superior sigmoidal artery as well as the inferior mesenteric artery were negative for residual area of intraluminal contrast extravasation or  vessel irregularity. IMPRESSION: Technically successful percutaneous coil embolization of a distal arcade branch of the superior sigmoidal artery supplying an obvious area of intraluminal contrast extravasation within the proximal sigmoid colon correlating with the findings on preceding CTA. PLAN: - The patient is to remain flat for 4 hours with right leg straight. - The patient will continue to experience several additional bloody bowel movements and may continue to require additional resuscitation (as he was bleeding both before and during the procedure), however ultimately I am hopeful he will stabilize in the coming days. - If there remains clinical concern for persistent lower GI bleeding, would recommend coordinating with the interventional radiology service to perform a nuclear medicine tagged red blood cell study during daytime working hours as to expedite potential repeat angiography if the tagged study is positive. - While presumably secondary to diverticular disease, repeat colonoscopy after the resolution of acute symptoms is advised to exclude the presence of an underlying mass/lesion. Electronically Signed   By: Sandi Mariscal M.D.   On: 08/30/2020 08:40   IR Angiogram Follow Up Study  Result Date: 08/30/2020 INDICATION: Persistent lower GI bleeding. Positive CTA. Please perform mesenteric arteriogram with potential percutaneous embolization. EXAM: 1. ULTRASOUND GUIDANCE FOR ARTERIAL ACCESS 2. SELECTIVE SUPERIOR MESENTERIC ARTERIOGRAM 3. SELECTIVE INFERIOR MESENTERIC ARTERIOGRAM 4. SUB SELECTIVE SUPERIOR SIGMOID ARTERIOGRAM 5. SUB SELECTIVE ARTERIOGRAM OF DISTAL ARCADE BRANCH OF THE SUPERIOR SIGMOIDAL ARTERY AND PERCUTANEOUS COIL EMBOLIZATION COMPARISON:  CTA abdomen and pelvis - earlier same day MEDICATIONS: None ANESTHESIA/SEDATION: Moderate (conscious) sedation was employed during this procedure. A total of Versed 0.5 mg and Fentanyl 25 mcg was administered intravenously. Moderate Sedation Time: 40  minutes. The patient's level of consciousness and vital signs were monitored continuously by radiology nursing throughout the procedure under my direct supervision. CONTRAST:  75 cc Omnipaque 300 FLUOROSCOPY TIME:  14 minutes, 54 seconds (0,354 mGy) COMPLICATIONS: None immediate. PROCEDURE: Informed consent was obtained from the patient and the patient's husband following explanation of the procedure, risks, benefits and alternatives. All questions were addressed. A time out was performed prior to the initiation of the procedure. Maximal barrier sterile technique utilized including caps, mask, sterile gowns, sterile gloves, large sterile drape, hand hygiene, and Betadine prep. The right femoral head was marked fluoroscopically. Under sterile conditions and local anesthesia, the right common femoral artery access was performed with a micropuncture needle. Under direct ultrasound guidance, the right common femoral was accessed with a micropuncture kit. An ultrasound image was saved for documentation purposes. This allowed for placement of a 5-French vascular  sheath. A limited arteriogram was performed through the side arm of the sheath confirming appropriate access within the right common femoral artery. Over a Bentson wire, a Mickelson catheter was advanced the caudal aspect of the thoracic aorta where was reformed, back bled and flushed. The Mickelson catheter was then utilized to select the superior mesenteric artery and a selective superior mesenteric arteriogram was performed. Next, the Mickelson catheter was utilized to select the IMA and a selective inferior mesenteric arteriogram was performed With the use of a fathom 14 microwire, a Renegade microcatheter was utilized to select the superior sigmoid artery and a sub selective arteriogram was performed. The microcatheter was advanced further to select the distal arcade branch of the superior sigmoidal artery supplying the obvious area of intraluminal  extravasation within the sigmoid colon, correlating with the findings seen on preceding CTA. Sub selective injection was performed and the vessel was percutaneously coil embolized with 2 overlapping 2 mm diameter interlock coils. The microcatheter was retracted to the level of the superior sigmoid artery and post embolization arteriogram was performed. The microcatheter was then removed and a completion inferior mesenteric arteriogram was performed from the level of the Mickelson catheter. Images reviewed and the procedure was terminated. All wires, catheters and sheaths were removed from the patient. Hemostasis was achieved at the right groin access site with deployment of an ExoSeal closure device. The patient tolerated the procedure well without immediate post procedural complication. FINDINGS: Selective superior mesenteric arteriogram demonstrates conventional branching pattern and was negative for discrete area of vessel irregularity or contrast extravasation Selective inferior mesenteric arteriogram demonstrates an obvious area of contrast extravasation involving the superior aspect of the sigmoid colon at the level of the left ilium, correlating with the ill-defined area of intraluminal contrast extravasation on preceding CTA (representative image 134, series 5). Sub selective arteriogram of the superior sigmoidal artery demonstrates a distal arcade branch supplying the ill-defined area of intraluminal contrast extravasation. The proximal aspect of this distal arcade was cannulated and subsequent percutaneously coil embolized with interlock coils. Post embolization arteriograms performed from the level of the superior sigmoidal artery as well as the inferior mesenteric artery were negative for residual area of intraluminal contrast extravasation or vessel irregularity. IMPRESSION: Technically successful percutaneous coil embolization of a distal arcade branch of the superior sigmoidal artery supplying an  obvious area of intraluminal contrast extravasation within the proximal sigmoid colon correlating with the findings on preceding CTA. PLAN: - The patient is to remain flat for 4 hours with right leg straight. - The patient will continue to experience several additional bloody bowel movements and may continue to require additional resuscitation (as he was bleeding both before and during the procedure), however ultimately I am hopeful he will stabilize in the coming days. - If there remains clinical concern for persistent lower GI bleeding, would recommend coordinating with the interventional radiology service to perform a nuclear medicine tagged red blood cell study during daytime working hours as to expedite potential repeat angiography if the tagged study is positive. - While presumably secondary to diverticular disease, repeat colonoscopy after the resolution of acute symptoms is advised to exclude the presence of an underlying mass/lesion. Electronically Signed   By: Sandi Mariscal M.D.   On: 08/30/2020 08:40   IR US Guide Vasc Access Right  Result Date: 08/30/2020 INDICATION: Persistent lower GI bleeding. Positive CTA. Please perform mesenteric arteriogram with potential percutaneous embolization. EXAM: 1. ULTRASOUND GUIDANCE FOR ARTERIAL ACCESS 2. SELECTIVE SUPERIOR MESENTERIC ARTERIOGRAM 3. SELECTIVE INFERIOR MESENTERIC  ARTERIOGRAM 4. SUB SELECTIVE SUPERIOR SIGMOID ARTERIOGRAM 5. SUB SELECTIVE ARTERIOGRAM OF DISTAL ARCADE BRANCH OF THE SUPERIOR SIGMOIDAL ARTERY AND PERCUTANEOUS COIL EMBOLIZATION COMPARISON:  CTA abdomen and pelvis - earlier same day MEDICATIONS: None ANESTHESIA/SEDATION: Moderate (conscious) sedation was employed during this procedure. A total of Versed 0.5 mg and Fentanyl 25 mcg was administered intravenously. Moderate Sedation Time: 40 minutes. The patient's level of consciousness and vital signs were monitored continuously by radiology nursing throughout the procedure under my direct  supervision. CONTRAST:  75 cc Omnipaque 300 FLUOROSCOPY TIME:  14 minutes, 54 seconds (8,338 mGy) COMPLICATIONS: None immediate. PROCEDURE: Informed consent was obtained from the patient and the patient's husband following explanation of the procedure, risks, benefits and alternatives. All questions were addressed. A time out was performed prior to the initiation of the procedure. Maximal barrier sterile technique utilized including caps, mask, sterile gowns, sterile gloves, large sterile drape, hand hygiene, and Betadine prep. The right femoral head was marked fluoroscopically. Under sterile conditions and local anesthesia, the right common femoral artery access was performed with a micropuncture needle. Under direct ultrasound guidance, the right common femoral was accessed with a micropuncture kit. An ultrasound image was saved for documentation purposes. This allowed for placement of a 5-French vascular sheath. A limited arteriogram was performed through the side arm of the sheath confirming appropriate access within the right common femoral artery. Over a Bentson wire, a Mickelson catheter was advanced the caudal aspect of the thoracic aorta where was reformed, back bled and flushed. The Mickelson catheter was then utilized to select the superior mesenteric artery and a selective superior mesenteric arteriogram was performed. Next, the Mickelson catheter was utilized to select the IMA and a selective inferior mesenteric arteriogram was performed With the use of a fathom 14 microwire, a Renegade microcatheter was utilized to select the superior sigmoid artery and a sub selective arteriogram was performed. The microcatheter was advanced further to select the distal arcade branch of the superior sigmoidal artery supplying the obvious area of intraluminal extravasation within the sigmoid colon, correlating with the findings seen on preceding CTA. Sub selective injection was performed and the vessel was  percutaneously coil embolized with 2 overlapping 2 mm diameter interlock coils. The microcatheter was retracted to the level of the superior sigmoid artery and post embolization arteriogram was performed. The microcatheter was then removed and a completion inferior mesenteric arteriogram was performed from the level of the Mickelson catheter. Images reviewed and the procedure was terminated. All wires, catheters and sheaths were removed from the patient. Hemostasis was achieved at the right groin access site with deployment of an ExoSeal closure device. The patient tolerated the procedure well without immediate post procedural complication. FINDINGS: Selective superior mesenteric arteriogram demonstrates conventional branching pattern and was negative for discrete area of vessel irregularity or contrast extravasation Selective inferior mesenteric arteriogram demonstrates an obvious area of contrast extravasation involving the superior aspect of the sigmoid colon at the level of the left ilium, correlating with the ill-defined area of intraluminal contrast extravasation on preceding CTA (representative image 134, series 5). Sub selective arteriogram of the superior sigmoidal artery demonstrates a distal arcade branch supplying the ill-defined area of intraluminal contrast extravasation. The proximal aspect of this distal arcade was cannulated and subsequent percutaneously coil embolized with interlock coils. Post embolization arteriograms performed from the level of the superior sigmoidal artery as well as the inferior mesenteric artery were negative for residual area of intraluminal contrast extravasation or vessel irregularity. IMPRESSION: Technically successful percutaneous coil  embolization of a distal arcade branch of the superior sigmoidal artery supplying an obvious area of intraluminal contrast extravasation within the proximal sigmoid colon correlating with the findings on preceding CTA. PLAN: - The patient  is to remain flat for 4 hours with right leg straight. - The patient will continue to experience several additional bloody bowel movements and may continue to require additional resuscitation (as he was bleeding both before and during the procedure), however ultimately I am hopeful he will stabilize in the coming days. - If there remains clinical concern for persistent lower GI bleeding, would recommend coordinating with the interventional radiology service to perform a nuclear medicine tagged red blood cell study during daytime working hours as to expedite potential repeat angiography if the tagged study is positive. - While presumably secondary to diverticular disease, repeat colonoscopy after the resolution of acute symptoms is advised to exclude the presence of an underlying mass/lesion. Electronically Signed   By: Sandi Mariscal M.D.   On: 08/30/2020 08:40   DG Chest Port 1 View  Result Date: 08/30/2020 CLINICAL DATA:  Respiratory failure EXAM: PORTABLE CHEST 1 VIEW COMPARISON:  01/14/2020 FINDINGS: The heart size and mediastinal contours are within normal limits. Both lungs are clear. The visualized skeletal structures are unremarkable. IMPRESSION: No active disease. Electronically Signed   By: Fidela Salisbury MD   On: 08/30/2020 05:17   IR EMBO ART  VEN HEMORR LYMPH EXTRAV  INC GUIDE ROADMAPPING  Result Date: 08/30/2020 INDICATION: Persistent lower GI bleeding. Positive CTA. Please perform mesenteric arteriogram with potential percutaneous embolization. EXAM: 1. ULTRASOUND GUIDANCE FOR ARTERIAL ACCESS 2. SELECTIVE SUPERIOR MESENTERIC ARTERIOGRAM 3. SELECTIVE INFERIOR MESENTERIC ARTERIOGRAM 4. SUB SELECTIVE SUPERIOR SIGMOID ARTERIOGRAM 5. SUB SELECTIVE ARTERIOGRAM OF DISTAL ARCADE BRANCH OF THE SUPERIOR SIGMOIDAL ARTERY AND PERCUTANEOUS COIL EMBOLIZATION COMPARISON:  CTA abdomen and pelvis - earlier same day MEDICATIONS: None ANESTHESIA/SEDATION: Moderate (conscious) sedation was employed during this  procedure. A total of Versed 0.5 mg and Fentanyl 25 mcg was administered intravenously. Moderate Sedation Time: 40 minutes. The patient's level of consciousness and vital signs were monitored continuously by radiology nursing throughout the procedure under my direct supervision. CONTRAST:  75 cc Omnipaque 300 FLUOROSCOPY TIME:  14 minutes, 54 seconds (8,916 mGy) COMPLICATIONS: None immediate. PROCEDURE: Informed consent was obtained from the patient and the patient's husband following explanation of the procedure, risks, benefits and alternatives. All questions were addressed. A time out was performed prior to the initiation of the procedure. Maximal barrier sterile technique utilized including caps, mask, sterile gowns, sterile gloves, large sterile drape, hand hygiene, and Betadine prep. The right femoral head was marked fluoroscopically. Under sterile conditions and local anesthesia, the right common femoral artery access was performed with a micropuncture needle. Under direct ultrasound guidance, the right common femoral was accessed with a micropuncture kit. An ultrasound image was saved for documentation purposes. This allowed for placement of a 5-French vascular sheath. A limited arteriogram was performed through the side arm of the sheath confirming appropriate access within the right common femoral artery. Over a Bentson wire, a Mickelson catheter was advanced the caudal aspect of the thoracic aorta where was reformed, back bled and flushed. The Mickelson catheter was then utilized to select the superior mesenteric artery and a selective superior mesenteric arteriogram was performed. Next, the Mickelson catheter was utilized to select the IMA and a selective inferior mesenteric arteriogram was performed With the use of a fathom 14 microwire, a Renegade microcatheter was utilized to select the superior sigmoid  artery and a sub selective arteriogram was performed. The microcatheter was advanced further to  select the distal arcade branch of the superior sigmoidal artery supplying the obvious area of intraluminal extravasation within the sigmoid colon, correlating with the findings seen on preceding CTA. Sub selective injection was performed and the vessel was percutaneously coil embolized with 2 overlapping 2 mm diameter interlock coils. The microcatheter was retracted to the level of the superior sigmoid artery and post embolization arteriogram was performed. The microcatheter was then removed and a completion inferior mesenteric arteriogram was performed from the level of the Mickelson catheter. Images reviewed and the procedure was terminated. All wires, catheters and sheaths were removed from the patient. Hemostasis was achieved at the right groin access site with deployment of an ExoSeal closure device. The patient tolerated the procedure well without immediate post procedural complication. FINDINGS: Selective superior mesenteric arteriogram demonstrates conventional branching pattern and was negative for discrete area of vessel irregularity or contrast extravasation Selective inferior mesenteric arteriogram demonstrates an obvious area of contrast extravasation involving the superior aspect of the sigmoid colon at the level of the left ilium, correlating with the ill-defined area of intraluminal contrast extravasation on preceding CTA (representative image 134, series 5). Sub selective arteriogram of the superior sigmoidal artery demonstrates a distal arcade branch supplying the ill-defined area of intraluminal contrast extravasation. The proximal aspect of this distal arcade was cannulated and subsequent percutaneously coil embolized with interlock coils. Post embolization arteriograms performed from the level of the superior sigmoidal artery as well as the inferior mesenteric artery were negative for residual area of intraluminal contrast extravasation or vessel irregularity. IMPRESSION: Technically successful  percutaneous coil embolization of a distal arcade branch of the superior sigmoidal artery supplying an obvious area of intraluminal contrast extravasation within the proximal sigmoid colon correlating with the findings on preceding CTA. PLAN: - The patient is to remain flat for 4 hours with right leg straight. - The patient will continue to experience several additional bloody bowel movements and may continue to require additional resuscitation (as he was bleeding both before and during the procedure), however ultimately I am hopeful he will stabilize in the coming days. - If there remains clinical concern for persistent lower GI bleeding, would recommend coordinating with the interventional radiology service to perform a nuclear medicine tagged red blood cell study during daytime working hours as to expedite potential repeat angiography if the tagged study is positive. - While presumably secondary to diverticular disease, repeat colonoscopy after the resolution of acute symptoms is advised to exclude the presence of an underlying mass/lesion. Electronically Signed   By: Sandi Mariscal M.D.   On: 08/30/2020 08:40   CT Angio Abd/Pel W and/or Wo Contrast  Result Date: 08/29/2020 CLINICAL DATA:  Gastrointestinal bleeding. EXAM: CTA ABDOMEN AND PELVIS WITHOUT AND WITH CONTRAST TECHNIQUE: Multidetector CT imaging of the abdomen and pelvis was performed using the standard protocol during bolus administration of intravenous contrast. Multiplanar reconstructed images and MIPs were obtained and reviewed to evaluate the vascular anatomy. CONTRAST:  120m OMNIPAQUE IOHEXOL 350 MG/ML SOLN COMPARISON:  July 23, 2020. FINDINGS: VASCULAR Aorta: Normal caliber aorta without aneurysm, dissection, vasculitis or significant stenosis. Celiac: Patent without evidence of aneurysm, dissection, vasculitis or significant stenosis. SMA: Patent without evidence of aneurysm, dissection, vasculitis or significant stenosis. Renals: Both renal  arteries are patent without evidence of aneurysm, dissection, vasculitis, fibromuscular dysplasia or significant stenosis. IMA: Patent without evidence of aneurysm, dissection, vasculitis or significant stenosis. Inflow: Patent without evidence of  aneurysm, dissection, vasculitis or significant stenosis. Proximal Outflow: Bilateral common femoral and visualized portions of the superficial and profunda femoral arteries are patent without evidence of aneurysm, dissection, vasculitis or significant stenosis. Veins: No obvious venous abnormality within the limitations of this arterial phase study. Review of the MIP images confirms the above findings. NON-VASCULAR Lower chest: No acute abnormality. Hepatobiliary: No focal liver abnormality is seen. No gallstones, gallbladder wall thickening, or biliary dilatation. Pancreas: Unremarkable. No pancreatic ductal dilatation or surrounding inflammatory changes. Spleen: Normal in size without focal abnormality. Adrenals/Urinary Tract: Adrenal glands are unremarkable. Kidneys are normal, without renal calculi, focal lesion, or hydronephrosis. Bladder is unremarkable. Stomach/Bowel: The stomach is unremarkable. Diverticulosis of descending and sigmoid colon is noted. Is no evidence of bowel obstruction or inflammation. However, there does appear to be some degree of contrast extravasation involving the distal descending colon concerning for active gastrointestinal hemorrhage. This is best seen on image number 141 of series 6. Lymphatic: No significant adenopathy is noted. Reproductive: Status post hysterectomy. No adnexal masses. Other: No abdominal wall hernia or abnormality. No abdominopelvic ascites. Musculoskeletal: No fracture is seen. IMPRESSION: VASCULAR Contrast extravasation is seen involving the distal descending colon concerning for active gastrointestinal hemorrhage. Critical Value/emergent results were called by telephone at the time of interpretation on 08/29/2020 at  9:37 pm to provider [facility] , who verbally acknowledged these results. There is no evidence of abdominal aortic dissection or aneurysm. No definite mesenteric or renal artery stenosis is noted. NON-VASCULAR Sigmoid diverticulosis without inflammation. Aortic Atherosclerosis (ICD10-I70.0). Electronically Signed   By: Marijo Conception M.D.   On: 08/29/2020 21:37    Medications:  Scheduled: . Chlorhexidine Gluconate Cloth  6 each Topical Daily  . gabapentin  100 mg Oral TID  . insulin aspart  0-15 Units Subcutaneous Q4H  . insulin glargine  9 Units Subcutaneous QHS  . mouth rinse  15 mL Mouth Rinse BID  . mupirocin ointment  1 application Nasal BID  . pantoprazole (PROTONIX) IV  40 mg Intravenous Q24H  . predniSONE  40 mg Oral BID WC   Continuous: . potassium PHOSPHATE IVPB (in mmol) 85 mL/hr at 08/30/20 1000    Assessment/Plan: 1) Diverticular bleed s/p embolization. 2) Anemia - stable.   The patient is well after the embolization.  The last bloody bowel movement was around 4 PM and her HGB is stable.  Her vital signs are stable, but she is mildly tachycardic.    Plan: 1) Monitor HGB and transfuse as necessary. 2) Agree with IR, if bleeding recurs a bleeding scan will be beneficial. 3) There is no current need for GI intervention.  Call if her clinical status changes.  Signing off.  LOS: 1 day   Jasiri Hanawalt D 08/30/2020, 12:33 PM

## 2020-08-31 DIAGNOSIS — K625 Hemorrhage of anus and rectum: Secondary | ICD-10-CM | POA: Diagnosis not present

## 2020-08-31 LAB — GLUCOSE, CAPILLARY
Glucose-Capillary: 241 mg/dL — ABNORMAL HIGH (ref 70–99)
Glucose-Capillary: 171 mg/dL — ABNORMAL HIGH (ref 70–99)
Glucose-Capillary: 146 mg/dL — ABNORMAL HIGH (ref 70–99)
Glucose-Capillary: 261 mg/dL — ABNORMAL HIGH (ref 70–99)
Glucose-Capillary: 275 mg/dL — ABNORMAL HIGH (ref 70–99)
Glucose-Capillary: 219 mg/dL — ABNORMAL HIGH (ref 70–99)

## 2020-08-31 LAB — HEMOGLOBIN AND HEMATOCRIT, BLOOD
HCT: 21.8 % — ABNORMAL LOW (ref 36.0–46.0)
Hemoglobin: 7 g/dL — ABNORMAL LOW (ref 12.0–15.0)

## 2020-08-31 LAB — CBC
HCT: 23.7 % — ABNORMAL LOW (ref 36.0–46.0)
Hemoglobin: 7.9 g/dL — ABNORMAL LOW (ref 12.0–15.0)
MCH: 31.6 pg (ref 26.0–34.0)
MCHC: 33.3 g/dL (ref 30.0–36.0)
MCV: 94.8 fL (ref 80.0–100.0)
Platelets: 144 10*3/uL — ABNORMAL LOW (ref 150–400)
RBC: 2.5 MIL/uL — ABNORMAL LOW (ref 3.87–5.11)
RDW: 18.1 % — ABNORMAL HIGH (ref 11.5–15.5)
WBC: 16.7 10*3/uL — ABNORMAL HIGH (ref 4.0–10.5)
nRBC: 3.2 % — ABNORMAL HIGH (ref 0.0–0.2)

## 2020-08-31 LAB — CBC WITH DIFFERENTIAL/PLATELET
Abs Immature Granulocytes: 0.3 10*3/uL — ABNORMAL HIGH (ref 0.00–0.07)
Basophils Absolute: 0 10*3/uL (ref 0.0–0.1)
Basophils Relative: 0 %
Eosinophils Absolute: 0 10*3/uL (ref 0.0–0.5)
Eosinophils Relative: 0 %
HCT: 23.5 % — ABNORMAL LOW (ref 36.0–46.0)
Hemoglobin: 7.5 g/dL — ABNORMAL LOW (ref 12.0–15.0)
Immature Granulocytes: 2 %
Lymphocytes Relative: 6 %
Lymphs Abs: 0.8 10*3/uL (ref 0.7–4.0)
MCH: 30.5 pg (ref 26.0–34.0)
MCHC: 31.9 g/dL (ref 30.0–36.0)
MCV: 95.5 fL (ref 80.0–100.0)
Monocytes Absolute: 0.5 10*3/uL (ref 0.1–1.0)
Monocytes Relative: 3 %
Neutro Abs: 12.3 10*3/uL — ABNORMAL HIGH (ref 1.7–7.7)
Neutrophils Relative %: 89 %
Platelets: 124 10*3/uL — ABNORMAL LOW (ref 150–400)
RBC: 2.46 MIL/uL — ABNORMAL LOW (ref 3.87–5.11)
RDW: 17.8 % — ABNORMAL HIGH (ref 11.5–15.5)
WBC: 13.9 10*3/uL — ABNORMAL HIGH (ref 4.0–10.5)
nRBC: 2.7 % — ABNORMAL HIGH (ref 0.0–0.2)

## 2020-08-31 LAB — BASIC METABOLIC PANEL
Anion gap: 5 (ref 5–15)
Anion gap: 7 (ref 5–15)
BUN: 9 mg/dL (ref 6–20)
BUN: 11 mg/dL (ref 6–20)
CO2: 30 mmol/L (ref 22–32)
CO2: 26 mmol/L (ref 22–32)
Calcium: 8.3 mg/dL — ABNORMAL LOW (ref 8.9–10.3)
Calcium: 7.8 mg/dL — ABNORMAL LOW (ref 8.9–10.3)
Chloride: 89 mmol/L — ABNORMAL LOW (ref 98–111)
Chloride: 91 mmol/L — ABNORMAL LOW (ref 98–111)
Creatinine, Ser: 0.62 mg/dL (ref 0.44–1.00)
Creatinine, Ser: 0.73 mg/dL (ref 0.44–1.00)
GFR, Estimated: >60 mL/min (ref >60)
GFR, Estimated: >60 mL/min (ref >60)
Glucose, Bld: 280 mg/dL — ABNORMAL HIGH (ref 70–99)
Glucose, Bld: 223 mg/dL — ABNORMAL HIGH (ref 70–99)
Potassium: 4.9 mmol/L (ref 3.5–5.1)
Potassium: 4.3 mmol/L (ref 3.5–5.1)
Sodium: 124 mmol/L — ABNORMAL LOW (ref 135–145)
Sodium: 124 mmol/L — ABNORMAL LOW (ref 135–145)

## 2020-08-31 LAB — PHOSPHORUS: Phosphorus: 4 mg/dL (ref 2.5–4.6)

## 2020-08-31 LAB — MAGNESIUM: Magnesium: 2 mg/dL (ref 1.7–2.4)

## 2020-08-31 MED ORDER — INSULIN GLARGINE 100 UNIT/ML ~~LOC~~ SOLN
18.0000 [IU] | Freq: Every day | SUBCUTANEOUS | Status: DC
  Administered 2020-08-31: 18 [IU] via SUBCUTANEOUS
  Filled 2020-08-31 (×2): qty 0.18

## 2020-08-31 MED ORDER — INSULIN ASPART 100 UNIT/ML ~~LOC~~ SOLN
3.0000 [IU] | Freq: Three times a day (TID) | SUBCUTANEOUS | Status: DC
  Administered 2020-08-31 – 2020-09-01 (×3): 3 [IU] via SUBCUTANEOUS

## 2020-08-31 MED ORDER — SODIUM CHLORIDE 0.9% IV SOLUTION: Freq: Once | INTRAVENOUS | Status: AC

## 2020-08-31 NOTE — Progress Notes (Signed)
PROGRESS NOTE    Rhonda [name]  NWG:[id number] DOB: 03/01/1961 DOA: 08/29/2020 PCP: [facility] Narrative:60 year old female with history of hypertension, obesity, obstructive sleep apnea, not using CPAP, neurosarcoidosis on chronic prednisone therapy, wheelchair-bound due to chronic debility, recurrent GI bleeding with recent multiple hospitalizations presented back to the emergency room with frank rectal bleeding and hypotension.  3/8, admission with rectal bleeding, EGD and colonoscopy negative.  Capsule endoscopy with erythema in the small bowel. 4/4-4/6 admitted with hematochezia, symptomatically treated, capsule endoscopy and discharged home. 4/13 presented with hematochezia, CT angiogram with acute bleeding in the descending colon status post coil embolization of the distal Arquette branch of IMA.  Insulin infusion per blood sugars.  Admitted to ICU. 4/15, transferred out of ICU.    Assessment & Plan:   Active Problems:   GI bleed   Hypotension   Rectal bleeding   AKI (acute kidney injury) ([place])  Acute lower GI bleeding, diverticular bleeding.  Hemorrhagic shock secondary to acute GI bleeding and anemia of blood loss: Known diverticular disease.  He status post coil embolization 4/13.  Hemodynamically stabilizing since then.  No bowel movement since procedure. Baseline hemoglobin about 9.9-hemoglobin 7.9-2 units of PRBC transfusion-trended down to 7.9. Blood pressure is stabilized. Allow regular diet, monitor closely.  Recheck hemoglobin twice a day and transfuse for less than 7. GI signed off.  Any brisk bleeding, will need angiogram.  Acute renal failure: Due to #1.  Improved and normalized.  Type 2 diabetes with hyperglycemia: Now eating regular diet.  Off insulin drip.  Increased dose of long-acting insulin to her home doses.  We will start prandial insulin.  Neurosarcoidosis/chronic debility and wheelchair-bound status: Patient on prednisone 40  mg twice a day that she will continue.  Also on gabapentin.  Obstructive sleep apnea: Declined CPAP.  Does not use at home so not using in the hospital.  Hypertension: On metoprolol.  On hold due to hypotension.  Hypochloremic hyponatremia: Probably dilutional.  Also component of pseudohyponatremia due to high blood sugars.  We will recheck levels to ensure stabilization.  Electrolytes are adequate.  Advance activities.  Regular diet.  Can transfer to medical floor with telemetry.  Recheck hemoglobin in the evening.  Anticipate discharge home tomorrow if hemoglobin remains a stable.   DVT prophylaxis: SCDs Start: 08/29/20 2256   Code Status: Full code Family Communication: None at the bedside. Disposition Plan: Status is: Inpatient  Remains inpatient appropriate because:Inpatient level of care appropriate due to severity of illness   Dispo: The patient is from: Home              Anticipated d/c is to: Home              Patient currently is not medically stable to d/c.   Difficult to place patient no         Consultants:   PCCM  Gastroenterology  Interventional radiology  Procedures:   Embolization inferior mesenteric artery 4/13  Antimicrobials:   None   Subjective: Patient seen and examined.  She denied any complaints.  Denies any nausea or vomiting.  Denies any abdominal pain.  She had bloody bowel movement when she had procedure on 4/13, however since then she does not have any bowel movements yet.  Does not complain of any chest pain or shortness of breath.  Tolerating regular diet.  No family at the bedside.  Objective: Vitals:   08/31/20 0800 08/31/20 0830 08/31/20 0900 08/31/20 0930  BP: 122/82 126/82 121/76 106/78  Pulse: (!) 115 (!) 117 (!) 105 (!) 112  Resp: 15 (!) 34 (!) 21 19  Temp: 98.4 F (36.9 C)     TempSrc: Oral     SpO2: 100% (!) 73% 100% 100%  Weight:      Height:        Intake/Output Summary (Last 24 hours) at 08/31/2020 1106 Last  data filed at 08/31/2020 1100 Gross per 24 hour  Intake 543.81 ml  Output 2125 ml  Net -1581.19 ml   Filed Weights   08/29/20 1936 08/30/20 0022 08/30/20 0500  Weight: 85 kg 79.5 kg 79.5 kg    Examination:  General exam: Appears calm and comfortable Chronically sick looking.  Not in any distress.  She is cushingoid with puffy face. Respiratory system: Clear to auscultation. Respiratory effort normal. Cardiovascular system: S1 & S2 heard, RRR. No JVD, murmurs, rubs, gallops or clicks. No pedal edema. Gastrointestinal system: Abdomen is nondistended, soft and nontender. No organomegaly or masses felt. Normal bowel sounds heard. Central nervous system: Alert and oriented. No focal neurological deficits.    Data Reviewed: I have personally reviewed following labs and imaging studies  CBC: Recent Labs  Lab 08/29/20 2003 08/30/20 0118 08/30/20 0600 08/30/20 1349 08/31/20 0048  WBC 9.0  --  17.3*  --  16.7*  NEUTROABS 7.6  --   --   --   --   HGB 8.1* 9.3* 8.9* 9.2* 7.9*  HCT 26.3* 28.8* 27.4* 28.0* 23.7*  MCV 101.5*  --  93.8  --  94.8  PLT 216  --  157  --  993*   Basic Metabolic Panel: Recent Labs  Lab 08/29/20 2003 08/30/20 0600 08/31/20 0048  NA 124* 136 124*  K 4.8 4.1 4.9  CL 89* 99 89*  CO2 _0 GLUCOSE 616* 146* 280*  BUN _1 CREATININE 1.04* 0.82 0.62  CALCIUM 8.1* 8.4* 8.3*  MG  --  1.7 2.0  PHOS  --  2.1* 4.0   GFR: Estimated Creatinine Clearance: 75.5 mL/min (by C-G formula based on SCr of 0.62 mg/dL). Liver Function Tests: Recent Labs  Lab 08/29/20 2003  AST 37  ALT <5  ALKPHOS 137*  BILITOT 0.7  PROT 4.5*  ALBUMIN 2.6*   No results for input(s): LIPASE, AMYLASE in the last 168 hours. No results for input(s): AMMONIA in the last 168 hours. Coagulation Profile: Recent Labs  Lab 08/29/20 2003  INR 1.1   Cardiac Enzymes: No results for input(s): CKTOTAL, CKMB, CKMBINDEX, TROPONINI in the last 168 hours. BNP (last 3  results) No results for input(s): PROBNP in the last 8760 hours. HbA1C: No results for input(s): HGBA1C in the last 72 hours. CBG: Recent Labs  Lab 08/30/20 1525 08/30/20 1911 08/30/20 2333 08/31/20 0320 08/31/20 0810  GLUCAP 274* 334* 307* 241* 171*   Lipid Profile: No results for input(s): CHOL, HDL, LDLCALC, TRIG, CHOLHDL, LDLDIRECT in the last 72 hours. Thyroid Function Tests: No results for input(s): TSH, T4TOTAL, FREET4, T3FREE, THYROIDAB in the last 72 hours. Anemia Panel: Recent Labs    08/29/20 2017  VITAMINB12 2,853*  FOLATE 31.2  FERRITIN 170  TIBC 217*  IRON 72  RETICCTPCT 5.1*   Sepsis Labs: No results for input(s): PROCALCITON, LATICACIDVEN in the last 168 hours.  Recent Results (from the past 240 hour(s))  Resp Panel by RT-PCR (Flu A&B, Covid) Nasopharyngeal Swab     Status: None   Collection Time: 08/29/20  8:30 PM   Specimen: Nasopharyngeal Swab; Nasopharyngeal(NP) swabs in vial transport medium  Result Value Ref Range Status   SARS Coronavirus 2 by RT PCR NEGATIVE NEGATIVE Final    Comment: (NOTE) SARS-CoV-2 target nucleic acids are NOT DETECTED.  The SARS-CoV-2 RNA is generally detectable in upper respiratory specimens during the acute phase of infection. The lowest concentration of SARS-CoV-2 viral copies this assay can detect is 138 copies/mL. A negative result does not preclude SARS-Cov-2 infection and should not be used as the sole basis for treatment or other patient management decisions. A negative result may occur with  improper specimen collection/handling, submission of specimen other than nasopharyngeal swab, presence of viral mutation(s) within the areas targeted by this assay, and inadequate number of viral copies(<138 copies/mL). A negative result must be combined with clinical observations, patient history, and epidemiological information. The expected result is Negative.  Fact Sheet for Patients:   EntrepreneurPulse.com.au  Fact Sheet for Healthcare Providers:  IncredibleEmployment.be  This test is no t yet approved or cleared by the [place] FDA and  has been authorized for detection and/or diagnosis of SARS-CoV-2 by FDA under an Emergency Use Authorization (EUA). This EUA will remain  in effect (meaning this test can be used) for the duration of the COVID-19 declaration under Section 564(b)(1) of the Act, 21 U.S.C.section 360bbb-3(b)(1), unless the authorization is terminated  or revoked sooner.       Influenza A by PCR NEGATIVE NEGATIVE Final   Influenza B by PCR NEGATIVE NEGATIVE Final    Comment: (NOTE) The Xpert Xpress SARS-CoV-2/FLU/RSV plus assay is intended as an aid in the diagnosis of influenza from Nasopharyngeal swab specimens and should not be used as a sole basis for treatment. Nasal washings and aspirates are unacceptable for Xpert Xpress SARS-CoV-2/FLU/RSV testing.  Fact Sheet for Patients: EntrepreneurPulse.com.au  Fact Sheet for Healthcare Providers: IncredibleEmployment.be  This test is not yet approved or cleared by the [place] FDA and has been authorized for detection and/or diagnosis of SARS-CoV-2 by FDA under an Emergency Use Authorization (EUA). This EUA will remain in effect (meaning this test can be used) for the duration of the COVID-19 declaration under Section 564(b)(1) of the Act, 21 U.S.C. section 360bbb-3(b)(1), unless the authorization is terminated or revoked.  Performed at [facility], [place] [address], [place], [place] [address]   MRSA PCR Screening     Status: Abnormal   Collection Time: 08/30/20 12:41 AM   Specimen: Nasopharyngeal  Result Value Ref Range Status   MRSA by PCR POSITIVE (A) NEGATIVE Final    Comment:        The GeneXpert MRSA Assay (FDA approved for NASAL specimens only), is one component of a comprehensive MRSA  colonization surveillance program. It is not intended to diagnose MRSA infection nor to guide or monitor treatment for MRSA infections. RESULT CALLED TO, READ BACK BY AND VERIFIED WITH: Almyra Free RN 08/30/20 0503 JDW Performed at [facility], [address], [place], [place] [address]          Radiology Studies: IR Angiogram Visceral Selective  Result Date: 08/30/2020 INDICATION: Persistent lower GI bleeding. Positive CTA. Please perform mesenteric arteriogram with potential percutaneous embolization. EXAM: 1. ULTRASOUND GUIDANCE FOR ARTERIAL ACCESS 2. SELECTIVE SUPERIOR MESENTERIC ARTERIOGRAM 3. SELECTIVE INFERIOR MESENTERIC ARTERIOGRAM 4. SUB SELECTIVE SUPERIOR SIGMOID ARTERIOGRAM 5. SUB SELECTIVE ARTERIOGRAM OF DISTAL ARCADE BRANCH OF THE SUPERIOR SIGMOIDAL ARTERY AND PERCUTANEOUS COIL EMBOLIZATION COMPARISON:  CTA abdomen and pelvis - earlier same  day MEDICATIONS: None ANESTHESIA/SEDATION: Moderate (conscious) sedation was employed during this procedure. A total of Versed 0.5 mg and Fentanyl 25 mcg was administered intravenously. Moderate Sedation Time: 40 minutes. The patient's level of consciousness and vital signs were monitored continuously by radiology nursing throughout the procedure under my direct supervision. CONTRAST:  75 cc Omnipaque 300 FLUOROSCOPY TIME:  14 minutes, 54 seconds (0,960 mGy) COMPLICATIONS: None immediate. PROCEDURE: Informed consent was obtained from the patient and the patient's husband following explanation of the procedure, risks, benefits and alternatives. All questions were addressed. A time out was performed prior to the initiation of the procedure. Maximal barrier sterile technique utilized including caps, mask, sterile gowns, sterile gloves, large sterile drape, hand hygiene, and Betadine prep. The right femoral head was marked fluoroscopically. Under sterile conditions and local anesthesia, the right common femoral artery access was performed with a  micropuncture needle. Under direct ultrasound guidance, the right common femoral was accessed with a micropuncture kit. An ultrasound image was saved for documentation purposes. This allowed for placement of a 5-French vascular sheath. A limited arteriogram was performed through the side arm of the sheath confirming appropriate access within the right common femoral artery. Over a Bentson wire, a Mickelson catheter was advanced the caudal aspect of the thoracic aorta where was reformed, back bled and flushed. The Mickelson catheter was then utilized to select the superior mesenteric artery and a selective superior mesenteric arteriogram was performed. Next, the Mickelson catheter was utilized to select the IMA and a selective inferior mesenteric arteriogram was performed With the use of a fathom 14 microwire, a Renegade microcatheter was utilized to select the superior sigmoid artery and a sub selective arteriogram was performed. The microcatheter was advanced further to select the distal arcade branch of the superior sigmoidal artery supplying the obvious area of intraluminal extravasation within the sigmoid colon, correlating with the findings seen on preceding CTA. Sub selective injection was performed and the vessel was percutaneously coil embolized with 2 overlapping 2 mm diameter interlock coils. The microcatheter was retracted to the level of the superior sigmoid artery and post embolization arteriogram was performed. The microcatheter was then removed and a completion inferior mesenteric arteriogram was performed from the level of the Mickelson catheter. Images reviewed and the procedure was terminated. All wires, catheters and sheaths were removed from the patient. Hemostasis was achieved at the right groin access site with deployment of an ExoSeal closure device. The patient tolerated the procedure well without immediate post procedural complication. FINDINGS: Selective superior mesenteric arteriogram  demonstrates conventional branching pattern and was negative for discrete area of vessel irregularity or contrast extravasation Selective inferior mesenteric arteriogram demonstrates an obvious area of contrast extravasation involving the superior aspect of the sigmoid colon at the level of the left ilium, correlating with the ill-defined area of intraluminal contrast extravasation on preceding CTA (representative image 134, series 5). Sub selective arteriogram of the superior sigmoidal artery demonstrates a distal arcade branch supplying the ill-defined area of intraluminal contrast extravasation. The proximal aspect of this distal arcade was cannulated and subsequent percutaneously coil embolized with interlock coils. Post embolization arteriograms performed from the level of the superior sigmoidal artery as well as the inferior mesenteric artery were negative for residual area of intraluminal contrast extravasation or vessel irregularity. IMPRESSION: Technically successful percutaneous coil embolization of a distal arcade branch of the superior sigmoidal artery supplying an obvious area of intraluminal contrast extravasation within the proximal sigmoid colon correlating with the findings on preceding CTA. PLAN: -  The patient is to remain flat for 4 hours with right leg straight. - The patient will continue to experience several additional bloody bowel movements and may continue to require additional resuscitation (as he was bleeding both before and during the procedure), however ultimately I am hopeful he will stabilize in the coming days. - If there remains clinical concern for persistent lower GI bleeding, would recommend coordinating with the interventional radiology service to perform a nuclear medicine tagged red blood cell study during daytime working hours as to expedite potential repeat angiography if the tagged study is positive. - While presumably secondary to diverticular disease, repeat colonoscopy  after the resolution of acute symptoms is advised to exclude the presence of an underlying mass/lesion. Electronically Signed   By: Sandi Mariscal M.D.   On: 08/30/2020 08:40   IR Angiogram Visceral Selective  Result Date: 08/30/2020 INDICATION: Persistent lower GI bleeding. Positive CTA. Please perform mesenteric arteriogram with potential percutaneous embolization. EXAM: 1. ULTRASOUND GUIDANCE FOR ARTERIAL ACCESS 2. SELECTIVE SUPERIOR MESENTERIC ARTERIOGRAM 3. SELECTIVE INFERIOR MESENTERIC ARTERIOGRAM 4. SUB SELECTIVE SUPERIOR SIGMOID ARTERIOGRAM 5. SUB SELECTIVE ARTERIOGRAM OF DISTAL ARCADE BRANCH OF THE SUPERIOR SIGMOIDAL ARTERY AND PERCUTANEOUS COIL EMBOLIZATION COMPARISON:  CTA abdomen and pelvis - earlier same day MEDICATIONS: None ANESTHESIA/SEDATION: Moderate (conscious) sedation was employed during this procedure. A total of Versed 0.5 mg and Fentanyl 25 mcg was administered intravenously. Moderate Sedation Time: 40 minutes. The patient's level of consciousness and vital signs were monitored continuously by radiology nursing throughout the procedure under my direct supervision. CONTRAST:  75 cc Omnipaque 300 FLUOROSCOPY TIME:  14 minutes, 54 seconds (7,035 mGy) COMPLICATIONS: None immediate. PROCEDURE: Informed consent was obtained from the patient and the patient's husband following explanation of the procedure, risks, benefits and alternatives. All questions were addressed. A time out was performed prior to the initiation of the procedure. Maximal barrier sterile technique utilized including caps, mask, sterile gowns, sterile gloves, large sterile drape, hand hygiene, and Betadine prep. The right femoral head was marked fluoroscopically. Under sterile conditions and local anesthesia, the right common femoral artery access was performed with a micropuncture needle. Under direct ultrasound guidance, the right common femoral was accessed with a micropuncture kit. An ultrasound image was saved for  documentation purposes. This allowed for placement of a 5-French vascular sheath. A limited arteriogram was performed through the side arm of the sheath confirming appropriate access within the right common femoral artery. Over a Bentson wire, a Mickelson catheter was advanced the caudal aspect of the thoracic aorta where was reformed, back bled and flushed. The Mickelson catheter was then utilized to select the superior mesenteric artery and a selective superior mesenteric arteriogram was performed. Next, the Mickelson catheter was utilized to select the IMA and a selective inferior mesenteric arteriogram was performed With the use of a fathom 14 microwire, a Renegade microcatheter was utilized to select the superior sigmoid artery and a sub selective arteriogram was performed. The microcatheter was advanced further to select the distal arcade branch of the superior sigmoidal artery supplying the obvious area of intraluminal extravasation within the sigmoid colon, correlating with the findings seen on preceding CTA. Sub selective injection was performed and the vessel was percutaneously coil embolized with 2 overlapping 2 mm diameter interlock coils. The microcatheter was retracted to the level of the superior sigmoid artery and post embolization arteriogram was performed. The microcatheter was then removed and a completion inferior mesenteric arteriogram was performed from the level of the Mickelson catheter. Images reviewed and  the procedure was terminated. All wires, catheters and sheaths were removed from the patient. Hemostasis was achieved at the right groin access site with deployment of an ExoSeal closure device. The patient tolerated the procedure well without immediate post procedural complication. FINDINGS: Selective superior mesenteric arteriogram demonstrates conventional branching pattern and was negative for discrete area of vessel irregularity or contrast extravasation Selective inferior mesenteric  arteriogram demonstrates an obvious area of contrast extravasation involving the superior aspect of the sigmoid colon at the level of the left ilium, correlating with the ill-defined area of intraluminal contrast extravasation on preceding CTA (representative image 134, series 5). Sub selective arteriogram of the superior sigmoidal artery demonstrates a distal arcade branch supplying the ill-defined area of intraluminal contrast extravasation. The proximal aspect of this distal arcade was cannulated and subsequent percutaneously coil embolized with interlock coils. Post embolization arteriograms performed from the level of the superior sigmoidal artery as well as the inferior mesenteric artery were negative for residual area of intraluminal contrast extravasation or vessel irregularity. IMPRESSION: Technically successful percutaneous coil embolization of a distal arcade branch of the superior sigmoidal artery supplying an obvious area of intraluminal contrast extravasation within the proximal sigmoid colon correlating with the findings on preceding CTA. PLAN: - The patient is to remain flat for 4 hours with right leg straight. - The patient will continue to experience several additional bloody bowel movements and may continue to require additional resuscitation (as he was bleeding both before and during the procedure), however ultimately I am hopeful he will stabilize in the coming days. - If there remains clinical concern for persistent lower GI bleeding, would recommend coordinating with the interventional radiology service to perform a nuclear medicine tagged red blood cell study during daytime working hours as to expedite potential repeat angiography if the tagged study is positive. - While presumably secondary to diverticular disease, repeat colonoscopy after the resolution of acute symptoms is advised to exclude the presence of an underlying mass/lesion. Electronically Signed   By: Sandi Mariscal M.D.   On:  08/30/2020 08:40   IR Angiogram Selective Each Additional Vessel  Result Date: 08/30/2020 INDICATION: Persistent lower GI bleeding. Positive CTA. Please perform mesenteric arteriogram with potential percutaneous embolization. EXAM: 1. ULTRASOUND GUIDANCE FOR ARTERIAL ACCESS 2. SELECTIVE SUPERIOR MESENTERIC ARTERIOGRAM 3. SELECTIVE INFERIOR MESENTERIC ARTERIOGRAM 4. SUB SELECTIVE SUPERIOR SIGMOID ARTERIOGRAM 5. SUB SELECTIVE ARTERIOGRAM OF DISTAL ARCADE BRANCH OF THE SUPERIOR SIGMOIDAL ARTERY AND PERCUTANEOUS COIL EMBOLIZATION COMPARISON:  CTA abdomen and pelvis - earlier same day MEDICATIONS: None ANESTHESIA/SEDATION: Moderate (conscious) sedation was employed during this procedure. A total of Versed 0.5 mg and Fentanyl 25 mcg was administered intravenously. Moderate Sedation Time: 40 minutes. The patient's level of consciousness and vital signs were monitored continuously by radiology nursing throughout the procedure under my direct supervision. CONTRAST:  75 cc Omnipaque 300 FLUOROSCOPY TIME:  14 minutes, 54 seconds (4,854 mGy) COMPLICATIONS: None immediate. PROCEDURE: Informed consent was obtained from the patient and the patient's husband following explanation of the procedure, risks, benefits and alternatives. All questions were addressed. A time out was performed prior to the initiation of the procedure. Maximal barrier sterile technique utilized including caps, mask, sterile gowns, sterile gloves, large sterile drape, hand hygiene, and Betadine prep. The right femoral head was marked fluoroscopically. Under sterile conditions and local anesthesia, the right common femoral artery access was performed with a micropuncture needle. Under direct ultrasound guidance, the right common femoral was accessed with a micropuncture kit. An ultrasound image was saved for documentation  purposes. This allowed for placement of a 5-French vascular sheath. A limited arteriogram was performed through the side arm of the  sheath confirming appropriate access within the right common femoral artery. Over a Bentson wire, a Mickelson catheter was advanced the caudal aspect of the thoracic aorta where was reformed, back bled and flushed. The Mickelson catheter was then utilized to select the superior mesenteric artery and a selective superior mesenteric arteriogram was performed. Next, the Mickelson catheter was utilized to select the IMA and a selective inferior mesenteric arteriogram was performed With the use of a fathom 14 microwire, a Renegade microcatheter was utilized to select the superior sigmoid artery and a sub selective arteriogram was performed. The microcatheter was advanced further to select the distal arcade branch of the superior sigmoidal artery supplying the obvious area of intraluminal extravasation within the sigmoid colon, correlating with the findings seen on preceding CTA. Sub selective injection was performed and the vessel was percutaneously coil embolized with 2 overlapping 2 mm diameter interlock coils. The microcatheter was retracted to the level of the superior sigmoid artery and post embolization arteriogram was performed. The microcatheter was then removed and a completion inferior mesenteric arteriogram was performed from the level of the Mickelson catheter. Images reviewed and the procedure was terminated. All wires, catheters and sheaths were removed from the patient. Hemostasis was achieved at the right groin access site with deployment of an ExoSeal closure device. The patient tolerated the procedure well without immediate post procedural complication. FINDINGS: Selective superior mesenteric arteriogram demonstrates conventional branching pattern and was negative for discrete area of vessel irregularity or contrast extravasation Selective inferior mesenteric arteriogram demonstrates an obvious area of contrast extravasation involving the superior aspect of the sigmoid colon at the level of the left  ilium, correlating with the ill-defined area of intraluminal contrast extravasation on preceding CTA (representative image 134, series 5). Sub selective arteriogram of the superior sigmoidal artery demonstrates a distal arcade branch supplying the ill-defined area of intraluminal contrast extravasation. The proximal aspect of this distal arcade was cannulated and subsequent percutaneously coil embolized with interlock coils. Post embolization arteriograms performed from the level of the superior sigmoidal artery as well as the inferior mesenteric artery were negative for residual area of intraluminal contrast extravasation or vessel irregularity. IMPRESSION: Technically successful percutaneous coil embolization of a distal arcade branch of the superior sigmoidal artery supplying an obvious area of intraluminal contrast extravasation within the proximal sigmoid colon correlating with the findings on preceding CTA. PLAN: - The patient is to remain flat for 4 hours with right leg straight. - The patient will continue to experience several additional bloody bowel movements and may continue to require additional resuscitation (as he was bleeding both before and during the procedure), however ultimately I am hopeful he will stabilize in the coming days. - If there remains clinical concern for persistent lower GI bleeding, would recommend coordinating with the interventional radiology service to perform a nuclear medicine tagged red blood cell study during daytime working hours as to expedite potential repeat angiography if the tagged study is positive. - While presumably secondary to diverticular disease, repeat colonoscopy after the resolution of acute symptoms is advised to exclude the presence of an underlying mass/lesion. Electronically Signed   By: Sandi Mariscal M.D.   On: 08/30/2020 08:40   IR Angiogram Follow Up Study  Result Date: 08/30/2020 INDICATION: Persistent lower GI bleeding. Positive CTA. Please perform  mesenteric arteriogram with potential percutaneous embolization. EXAM: 1. ULTRASOUND GUIDANCE FOR ARTERIAL ACCESS  2. SELECTIVE SUPERIOR MESENTERIC ARTERIOGRAM 3. SELECTIVE INFERIOR MESENTERIC ARTERIOGRAM 4. SUB SELECTIVE SUPERIOR SIGMOID ARTERIOGRAM 5. SUB SELECTIVE ARTERIOGRAM OF DISTAL ARCADE BRANCH OF THE SUPERIOR SIGMOIDAL ARTERY AND PERCUTANEOUS COIL EMBOLIZATION COMPARISON:  CTA abdomen and pelvis - earlier same day MEDICATIONS: None ANESTHESIA/SEDATION: Moderate (conscious) sedation was employed during this procedure. A total of Versed 0.5 mg and Fentanyl 25 mcg was administered intravenously. Moderate Sedation Time: 40 minutes. The patient's level of consciousness and vital signs were monitored continuously by radiology nursing throughout the procedure under my direct supervision. CONTRAST:  75 cc Omnipaque 300 FLUOROSCOPY TIME:  14 minutes, 54 seconds (2,831 mGy) COMPLICATIONS: None immediate. PROCEDURE: Informed consent was obtained from the patient and the patient's husband following explanation of the procedure, risks, benefits and alternatives. All questions were addressed. A time out was performed prior to the initiation of the procedure. Maximal barrier sterile technique utilized including caps, mask, sterile gowns, sterile gloves, large sterile drape, hand hygiene, and Betadine prep. The right femoral head was marked fluoroscopically. Under sterile conditions and local anesthesia, the right common femoral artery access was performed with a micropuncture needle. Under direct ultrasound guidance, the right common femoral was accessed with a micropuncture kit. An ultrasound image was saved for documentation purposes. This allowed for placement of a 5-French vascular sheath. A limited arteriogram was performed through the side arm of the sheath confirming appropriate access within the right common femoral artery. Over a Bentson wire, a Mickelson catheter was advanced the caudal aspect of the thoracic  aorta where was reformed, back bled and flushed. The Mickelson catheter was then utilized to select the superior mesenteric artery and a selective superior mesenteric arteriogram was performed. Next, the Mickelson catheter was utilized to select the IMA and a selective inferior mesenteric arteriogram was performed With the use of a fathom 14 microwire, a Renegade microcatheter was utilized to select the superior sigmoid artery and a sub selective arteriogram was performed. The microcatheter was advanced further to select the distal arcade branch of the superior sigmoidal artery supplying the obvious area of intraluminal extravasation within the sigmoid colon, correlating with the findings seen on preceding CTA. Sub selective injection was performed and the vessel was percutaneously coil embolized with 2 overlapping 2 mm diameter interlock coils. The microcatheter was retracted to the level of the superior sigmoid artery and post embolization arteriogram was performed. The microcatheter was then removed and a completion inferior mesenteric arteriogram was performed from the level of the Mickelson catheter. Images reviewed and the procedure was terminated. All wires, catheters and sheaths were removed from the patient. Hemostasis was achieved at the right groin access site with deployment of an ExoSeal closure device. The patient tolerated the procedure well without immediate post procedural complication. FINDINGS: Selective superior mesenteric arteriogram demonstrates conventional branching pattern and was negative for discrete area of vessel irregularity or contrast extravasation Selective inferior mesenteric arteriogram demonstrates an obvious area of contrast extravasation involving the superior aspect of the sigmoid colon at the level of the left ilium, correlating with the ill-defined area of intraluminal contrast extravasation on preceding CTA (representative image 134, series 5). Sub selective arteriogram of  the superior sigmoidal artery demonstrates a distal arcade branch supplying the ill-defined area of intraluminal contrast extravasation. The proximal aspect of this distal arcade was cannulated and subsequent percutaneously coil embolized with interlock coils. Post embolization arteriograms performed from the level of the superior sigmoidal artery as well as the inferior mesenteric artery were negative for residual area of intraluminal contrast  extravasation or vessel irregularity. IMPRESSION: Technically successful percutaneous coil embolization of a distal arcade branch of the superior sigmoidal artery supplying an obvious area of intraluminal contrast extravasation within the proximal sigmoid colon correlating with the findings on preceding CTA. PLAN: - The patient is to remain flat for 4 hours with right leg straight. - The patient will continue to experience several additional bloody bowel movements and may continue to require additional resuscitation (as he was bleeding both before and during the procedure), however ultimately I am hopeful he will stabilize in the coming days. - If there remains clinical concern for persistent lower GI bleeding, would recommend coordinating with the interventional radiology service to perform a nuclear medicine tagged red blood cell study during daytime working hours as to expedite potential repeat angiography if the tagged study is positive. - While presumably secondary to diverticular disease, repeat colonoscopy after the resolution of acute symptoms is advised to exclude the presence of an underlying mass/lesion. Electronically Signed   By: Sandi Mariscal M.D.   On: 08/30/2020 08:40   IR US Guide Vasc Access Right  Result Date: 08/30/2020 INDICATION: Persistent lower GI bleeding. Positive CTA. Please perform mesenteric arteriogram with potential percutaneous embolization. EXAM: 1. ULTRASOUND GUIDANCE FOR ARTERIAL ACCESS 2. SELECTIVE SUPERIOR MESENTERIC ARTERIOGRAM 3.  SELECTIVE INFERIOR MESENTERIC ARTERIOGRAM 4. SUB SELECTIVE SUPERIOR SIGMOID ARTERIOGRAM 5. SUB SELECTIVE ARTERIOGRAM OF DISTAL ARCADE BRANCH OF THE SUPERIOR SIGMOIDAL ARTERY AND PERCUTANEOUS COIL EMBOLIZATION COMPARISON:  CTA abdomen and pelvis - earlier same day MEDICATIONS: None ANESTHESIA/SEDATION: Moderate (conscious) sedation was employed during this procedure. A total of Versed 0.5 mg and Fentanyl 25 mcg was administered intravenously. Moderate Sedation Time: 40 minutes. The patient's level of consciousness and vital signs were monitored continuously by radiology nursing throughout the procedure under my direct supervision. CONTRAST:  75 cc Omnipaque 300 FLUOROSCOPY TIME:  14 minutes, 54 seconds (6,256 mGy) COMPLICATIONS: None immediate. PROCEDURE: Informed consent was obtained from the patient and the patient's husband following explanation of the procedure, risks, benefits and alternatives. All questions were addressed. A time out was performed prior to the initiation of the procedure. Maximal barrier sterile technique utilized including caps, mask, sterile gowns, sterile gloves, large sterile drape, hand hygiene, and Betadine prep. The right femoral head was marked fluoroscopically. Under sterile conditions and local anesthesia, the right common femoral artery access was performed with a micropuncture needle. Under direct ultrasound guidance, the right common femoral was accessed with a micropuncture kit. An ultrasound image was saved for documentation purposes. This allowed for placement of a 5-French vascular sheath. A limited arteriogram was performed through the side arm of the sheath confirming appropriate access within the right common femoral artery. Over a Bentson wire, a Mickelson catheter was advanced the caudal aspect of the thoracic aorta where was reformed, back bled and flushed. The Mickelson catheter was then utilized to select the superior mesenteric artery and a selective superior  mesenteric arteriogram was performed. Next, the Mickelson catheter was utilized to select the IMA and a selective inferior mesenteric arteriogram was performed With the use of a fathom 14 microwire, a Renegade microcatheter was utilized to select the superior sigmoid artery and a sub selective arteriogram was performed. The microcatheter was advanced further to select the distal arcade branch of the superior sigmoidal artery supplying the obvious area of intraluminal extravasation within the sigmoid colon, correlating with the findings seen on preceding CTA. Sub selective injection was performed and the vessel was percutaneously coil embolized with 2 overlapping 2 mm  diameter interlock coils. The microcatheter was retracted to the level of the superior sigmoid artery and post embolization arteriogram was performed. The microcatheter was then removed and a completion inferior mesenteric arteriogram was performed from the level of the Mickelson catheter. Images reviewed and the procedure was terminated. All wires, catheters and sheaths were removed from the patient. Hemostasis was achieved at the right groin access site with deployment of an ExoSeal closure device. The patient tolerated the procedure well without immediate post procedural complication. FINDINGS: Selective superior mesenteric arteriogram demonstrates conventional branching pattern and was negative for discrete area of vessel irregularity or contrast extravasation Selective inferior mesenteric arteriogram demonstrates an obvious area of contrast extravasation involving the superior aspect of the sigmoid colon at the level of the left ilium, correlating with the ill-defined area of intraluminal contrast extravasation on preceding CTA (representative image 134, series 5). Sub selective arteriogram of the superior sigmoidal artery demonstrates a distal arcade branch supplying the ill-defined area of intraluminal contrast extravasation. The proximal aspect  of this distal arcade was cannulated and subsequent percutaneously coil embolized with interlock coils. Post embolization arteriograms performed from the level of the superior sigmoidal artery as well as the inferior mesenteric artery were negative for residual area of intraluminal contrast extravasation or vessel irregularity. IMPRESSION: Technically successful percutaneous coil embolization of a distal arcade branch of the superior sigmoidal artery supplying an obvious area of intraluminal contrast extravasation within the proximal sigmoid colon correlating with the findings on preceding CTA. PLAN: - The patient is to remain flat for 4 hours with right leg straight. - The patient will continue to experience several additional bloody bowel movements and may continue to require additional resuscitation (as he was bleeding both before and during the procedure), however ultimately I am hopeful he will stabilize in the coming days. - If there remains clinical concern for persistent lower GI bleeding, would recommend coordinating with the interventional radiology service to perform a nuclear medicine tagged red blood cell study during daytime working hours as to expedite potential repeat angiography if the tagged study is positive. - While presumably secondary to diverticular disease, repeat colonoscopy after the resolution of acute symptoms is advised to exclude the presence of an underlying mass/lesion. Electronically Signed   By: Sandi Mariscal M.D.   On: 08/30/2020 08:40   DG Chest Port 1 View  Result Date: 08/30/2020 CLINICAL DATA:  Respiratory failure EXAM: PORTABLE CHEST 1 VIEW COMPARISON:  01/14/2020 FINDINGS: The heart size and mediastinal contours are within normal limits. Both lungs are clear. The visualized skeletal structures are unremarkable. IMPRESSION: No active disease. Electronically Signed   By: Fidela Salisbury MD   On: 08/30/2020 05:17   IR EMBO ART  VEN HEMORR LYMPH EXTRAV  INC GUIDE  ROADMAPPING  Result Date: 08/30/2020 INDICATION: Persistent lower GI bleeding. Positive CTA. Please perform mesenteric arteriogram with potential percutaneous embolization. EXAM: 1. ULTRASOUND GUIDANCE FOR ARTERIAL ACCESS 2. SELECTIVE SUPERIOR MESENTERIC ARTERIOGRAM 3. SELECTIVE INFERIOR MESENTERIC ARTERIOGRAM 4. SUB SELECTIVE SUPERIOR SIGMOID ARTERIOGRAM 5. SUB SELECTIVE ARTERIOGRAM OF DISTAL ARCADE BRANCH OF THE SUPERIOR SIGMOIDAL ARTERY AND PERCUTANEOUS COIL EMBOLIZATION COMPARISON:  CTA abdomen and pelvis - earlier same day MEDICATIONS: None ANESTHESIA/SEDATION: Moderate (conscious) sedation was employed during this procedure. A total of Versed 0.5 mg and Fentanyl 25 mcg was administered intravenously. Moderate Sedation Time: 40 minutes. The patient's level of consciousness and vital signs were monitored continuously by radiology nursing throughout the procedure under my direct supervision. CONTRAST:  75 cc Omnipaque 300 FLUOROSCOPY TIME:  14 minutes, 54 seconds (3,212 mGy) COMPLICATIONS: None immediate. PROCEDURE: Informed consent was obtained from the patient and the patient's husband following explanation of the procedure, risks, benefits and alternatives. All questions were addressed. A time out was performed prior to the initiation of the procedure. Maximal barrier sterile technique utilized including caps, mask, sterile gowns, sterile gloves, large sterile drape, hand hygiene, and Betadine prep. The right femoral head was marked fluoroscopically. Under sterile conditions and local anesthesia, the right common femoral artery access was performed with a micropuncture needle. Under direct ultrasound guidance, the right common femoral was accessed with a micropuncture kit. An ultrasound image was saved for documentation purposes. This allowed for placement of a 5-French vascular sheath. A limited arteriogram was performed through the side arm of the sheath confirming appropriate access within the right  common femoral artery. Over a Bentson wire, a Mickelson catheter was advanced the caudal aspect of the thoracic aorta where was reformed, back bled and flushed. The Mickelson catheter was then utilized to select the superior mesenteric artery and a selective superior mesenteric arteriogram was performed. Next, the Mickelson catheter was utilized to select the IMA and a selective inferior mesenteric arteriogram was performed With the use of a fathom 14 microwire, a Renegade microcatheter was utilized to select the superior sigmoid artery and a sub selective arteriogram was performed. The microcatheter was advanced further to select the distal arcade branch of the superior sigmoidal artery supplying the obvious area of intraluminal extravasation within the sigmoid colon, correlating with the findings seen on preceding CTA. Sub selective injection was performed and the vessel was percutaneously coil embolized with 2 overlapping 2 mm diameter interlock coils. The microcatheter was retracted to the level of the superior sigmoid artery and post embolization arteriogram was performed. The microcatheter was then removed and a completion inferior mesenteric arteriogram was performed from the level of the Mickelson catheter. Images reviewed and the procedure was terminated. All wires, catheters and sheaths were removed from the patient. Hemostasis was achieved at the right groin access site with deployment of an ExoSeal closure device. The patient tolerated the procedure well without immediate post procedural complication. FINDINGS: Selective superior mesenteric arteriogram demonstrates conventional branching pattern and was negative for discrete area of vessel irregularity or contrast extravasation Selective inferior mesenteric arteriogram demonstrates an obvious area of contrast extravasation involving the superior aspect of the sigmoid colon at the level of the left ilium, correlating with the ill-defined area of  intraluminal contrast extravasation on preceding CTA (representative image 134, series 5). Sub selective arteriogram of the superior sigmoidal artery demonstrates a distal arcade branch supplying the ill-defined area of intraluminal contrast extravasation. The proximal aspect of this distal arcade was cannulated and subsequent percutaneously coil embolized with interlock coils. Post embolization arteriograms performed from the level of the superior sigmoidal artery as well as the inferior mesenteric artery were negative for residual area of intraluminal contrast extravasation or vessel irregularity. IMPRESSION: Technically successful percutaneous coil embolization of a distal arcade branch of the superior sigmoidal artery supplying an obvious area of intraluminal contrast extravasation within the proximal sigmoid colon correlating with the findings on preceding CTA. PLAN: - The patient is to remain flat for 4 hours with right leg straight. - The patient will continue to experience several additional bloody bowel movements and may continue to require additional resuscitation (as he was bleeding both before and during the procedure), however ultimately I am hopeful he will stabilize in the coming days. - If there remains clinical concern  for persistent lower GI bleeding, would recommend coordinating with the interventional radiology service to perform a nuclear medicine tagged red blood cell study during daytime working hours as to expedite potential repeat angiography if the tagged study is positive. - While presumably secondary to diverticular disease, repeat colonoscopy after the resolution of acute symptoms is advised to exclude the presence of an underlying mass/lesion. Electronically Signed   By: Sandi Mariscal M.D.   On: 08/30/2020 08:40   CT Angio Abd/Pel W and/or Wo Contrast  Result Date: 08/29/2020 CLINICAL DATA:  Gastrointestinal bleeding. EXAM: CTA ABDOMEN AND PELVIS WITHOUT AND WITH CONTRAST TECHNIQUE:  Multidetector CT imaging of the abdomen and pelvis was performed using the standard protocol during bolus administration of intravenous contrast. Multiplanar reconstructed images and MIPs were obtained and reviewed to evaluate the vascular anatomy. CONTRAST:  117m OMNIPAQUE IOHEXOL 350 MG/ML SOLN COMPARISON:  July 23, 2020. FINDINGS: VASCULAR Aorta: Normal caliber aorta without aneurysm, dissection, vasculitis or significant stenosis. Celiac: Patent without evidence of aneurysm, dissection, vasculitis or significant stenosis. SMA: Patent without evidence of aneurysm, dissection, vasculitis or significant stenosis. Renals: Both renal arteries are patent without evidence of aneurysm, dissection, vasculitis, fibromuscular dysplasia or significant stenosis. IMA: Patent without evidence of aneurysm, dissection, vasculitis or significant stenosis. Inflow: Patent without evidence of aneurysm, dissection, vasculitis or significant stenosis. Proximal Outflow: Bilateral common femoral and visualized portions of the superficial and profunda femoral arteries are patent without evidence of aneurysm, dissection, vasculitis or significant stenosis. Veins: No obvious venous abnormality within the limitations of this arterial phase study. Review of the MIP images confirms the above findings. NON-VASCULAR Lower chest: No acute abnormality. Hepatobiliary: No focal liver abnormality is seen. No gallstones, gallbladder wall thickening, or biliary dilatation. Pancreas: Unremarkable. No pancreatic ductal dilatation or surrounding inflammatory changes. Spleen: Normal in size without focal abnormality. Adrenals/Urinary Tract: Adrenal glands are unremarkable. Kidneys are normal, without renal calculi, focal lesion, or hydronephrosis. Bladder is unremarkable. Stomach/Bowel: The stomach is unremarkable. Diverticulosis of descending and sigmoid colon is noted. Is no evidence of bowel obstruction or inflammation. However, there does appear to  be some degree of contrast extravasation involving the distal descending colon concerning for active gastrointestinal hemorrhage. This is best seen on image number 141 of series 6. Lymphatic: No significant adenopathy is noted. Reproductive: Status post hysterectomy. No adnexal masses. Other: No abdominal wall hernia or abnormality. No abdominopelvic ascites. Musculoskeletal: No fracture is seen. IMPRESSION: VASCULAR Contrast extravasation is seen involving the distal descending colon concerning for active gastrointestinal hemorrhage. Critical Value/emergent results were called by telephone at the time of interpretation on 08/29/2020 at 9:37 pm [facility] verbally acknowledged these results. There is no evidence of abdominal aortic dissection or aneurysm. No definite mesenteric or renal artery stenosis is noted. NON-VASCULAR Sigmoid diverticulosis without inflammation. Aortic Atherosclerosis (ICD10-I70.0). Electronically Signed   By: JMarijo ConceptionM.D.   On: 08/29/2020 21:37        Scheduled Meds: . Chlorhexidine Gluconate Cloth  6 each Topical Daily  . gabapentin  100 mg Oral TID  . insulin aspart  0-15 Units Subcutaneous Q4H  . insulin aspart  3 Units Subcutaneous TID WC  . insulin glargine  18 Units Subcutaneous QHS  . mouth rinse  15 mL Mouth Rinse BID  . mupirocin ointment  1 application Nasal BID  . pantoprazole (PROTONIX) IV  40 mg Intravenous Q24H  . predniSONE  40 mg Oral BID WC   Continuous Infusions:   LOS: 2  days    Time spent: 35 minutes in direct patient care.    Barb Merino, MD [facility] Pager [phone]

## 2020-08-31 NOTE — Progress Notes (Signed)
Inpatient Diabetes Program Recommendations  AACE/ADA: New Consensus Statement on Inpatient Glycemic Control (2015)  Target Ranges:  Prepandial:   less than 140 mg/dL      Peak postprandial:   less than 180 mg/dL (1-2 hours)      Critically ill patients:  140 - 180 mg/dL   Lab Results  Component Value Date   GLUCAP 171 (H) 08/31/2020   HGBA1C 11.8 (H) 07/23/2020    Review of Glycemic Control Results for Rhonda [name], Rhonda [name] (MRN [id number]) as of 08/31/2020 09:56  Ref. Range 08/30/2020 08:22 08/30/2020 11:39 08/30/2020 15:25 08/30/2020 19:11 08/30/2020 23:33 08/31/2020 03:20 08/31/2020 08:10  Glucose-Capillary Latest Ref Range: 70 - 99 mg/dL 151 (H) 260 (H) 274 (H) 334 (H) 307 (H) 241 (H) 171 (H)   Inpatient Diabetes Program Recommendations:   Postprandial CBGs elevated. Please consider: -Novolog 3 units tid meal coverage if eats 50%  Thank you, Bethena Roys E. Henery Betzold, RN, MSN, CDE  Diabetes Coordinator Inpatient Glycemic Control Team Team Pager [phone] (8am-5pm) 08/31/2020 9:57 AM

## 2020-09-01 ENCOUNTER — Inpatient Hospital Stay (HOSPITAL_COMMUNITY): Payer: 59 | Admitting: Anesthesiology

## 2020-09-01 ENCOUNTER — Encounter (HOSPITAL_COMMUNITY): Payer: Self-pay

## 2020-09-01 ENCOUNTER — Inpatient Hospital Stay (HOSPITAL_COMMUNITY): Payer: 59

## 2020-09-01 ENCOUNTER — Encounter (HOSPITAL_COMMUNITY)
Admission: EM | Disposition: A | Discharge status: Skilled Nursing Facility | Payer: Self-pay | Source: Home / Self Care | Attending: Internal Medicine

## 2020-09-01 ENCOUNTER — Encounter (HOSPITAL_COMMUNITY): Payer: Self-pay | Admitting: Internal Medicine

## 2020-09-01 DIAGNOSIS — E861 Hypovolemia: Secondary | ICD-10-CM | POA: Diagnosis not present

## 2020-09-01 DIAGNOSIS — I9589 Other hypotension: Secondary | ICD-10-CM | POA: Diagnosis not present

## 2020-09-01 DIAGNOSIS — K625 Hemorrhage of anus and rectum: Secondary | ICD-10-CM | POA: Diagnosis not present

## 2020-09-01 HISTORY — PX: BOWEL RESECTION: SHX1257

## 2020-09-01 HISTORY — PX: LAPAROTOMY: SHX154

## 2020-09-01 LAB — GLUCOSE, CAPILLARY
Glucose-Capillary: 229 mg/dL — ABNORMAL HIGH (ref 70–99)
Glucose-Capillary: 197 mg/dL — ABNORMAL HIGH (ref 70–99)
Glucose-Capillary: 245 mg/dL — ABNORMAL HIGH (ref 70–99)
Glucose-Capillary: 208 mg/dL — ABNORMAL HIGH (ref 70–99)
Glucose-Capillary: 160 mg/dL — ABNORMAL HIGH (ref 70–99)

## 2020-09-01 LAB — CBC WITH DIFFERENTIAL/PLATELET
Abs Immature Granulocytes: 0.35 10*3/uL — ABNORMAL HIGH (ref 0.00–0.07)
Abs Immature Granulocytes: 0.43 10*3/uL — ABNORMAL HIGH (ref 0.00–0.07)
Basophils Absolute: 0 10*3/uL (ref 0.0–0.1)
Basophils Absolute: 0 10*3/uL (ref 0.0–0.1)
Basophils Relative: 0 %
Basophils Relative: 0 %
Eosinophils Absolute: 0 10*3/uL (ref 0.0–0.5)
Eosinophils Absolute: 0 10*3/uL (ref 0.0–0.5)
Eosinophils Relative: 0 %
Eosinophils Relative: 0 %
HCT: 30.8 % — ABNORMAL LOW (ref 36.0–46.0)
HCT: 34.7 % — ABNORMAL LOW (ref 36.0–46.0)
Hemoglobin: 10.3 g/dL — ABNORMAL LOW (ref 12.0–15.0)
Hemoglobin: 11.8 g/dL — ABNORMAL LOW (ref 12.0–15.0)
Immature Granulocytes: 3 %
Immature Granulocytes: 3 %
Lymphocytes Relative: 6 %
Lymphocytes Relative: 5 %
Lymphs Abs: 0.7 10*3/uL (ref 0.7–4.0)
Lymphs Abs: 0.9 10*3/uL (ref 0.7–4.0)
MCH: 30.5 pg (ref 26.0–34.0)
MCH: 30.6 pg (ref 26.0–34.0)
MCHC: 33.4 g/dL (ref 30.0–36.0)
MCHC: 34 g/dL (ref 30.0–36.0)
MCV: 91.1 fL (ref 80.0–100.0)
MCV: 90.1 fL (ref 80.0–100.0)
Monocytes Absolute: 0.4 10*3/uL (ref 0.1–1.0)
Monocytes Absolute: 0.6 10*3/uL (ref 0.1–1.0)
Monocytes Relative: 3 %
Monocytes Relative: 4 %
Neutro Abs: 11.7 10*3/uL — ABNORMAL HIGH (ref 1.7–7.7)
Neutro Abs: 15.1 10*3/uL — ABNORMAL HIGH (ref 1.7–7.7)
Neutrophils Relative %: 88 %
Neutrophils Relative %: 88 %
Platelets: 110 10*3/uL — ABNORMAL LOW (ref 150–400)
Platelets: 122 10*3/uL — ABNORMAL LOW (ref 150–400)
RBC: 3.38 MIL/uL — ABNORMAL LOW (ref 3.87–5.11)
RBC: 3.85 MIL/uL — ABNORMAL LOW (ref 3.87–5.11)
RDW: 17.2 % — ABNORMAL HIGH (ref 11.5–15.5)
RDW: 17.5 % — ABNORMAL HIGH (ref 11.5–15.5)
WBC: 13.2 10*3/uL — ABNORMAL HIGH (ref 4.0–10.5)
WBC: 17.1 10*3/uL — ABNORMAL HIGH (ref 4.0–10.5)
nRBC: 2.6 % — ABNORMAL HIGH (ref 0.0–0.2)
nRBC: 2.1 % — ABNORMAL HIGH (ref 0.0–0.2)

## 2020-09-01 LAB — HEMOGLOBIN AND HEMATOCRIT, BLOOD
HCT: 30.8 % — ABNORMAL LOW (ref 36.0–46.0)
Hemoglobin: 10.4 g/dL — ABNORMAL LOW (ref 12.0–15.0)

## 2020-09-01 LAB — BASIC METABOLIC PANEL
Anion gap: 13 (ref 5–15)
BUN: 14 mg/dL (ref 6–20)
CO2: 26 mmol/L (ref 22–32)
Calcium: 8.5 mg/dL — ABNORMAL LOW (ref 8.9–10.3)
Chloride: 86 mmol/L — ABNORMAL LOW (ref 98–111)
Creatinine, Ser: 0.59 mg/dL (ref 0.44–1.00)
GFR, Estimated: >60 mL/min (ref >60)
Glucose, Bld: 173 mg/dL — ABNORMAL HIGH (ref 70–99)
Potassium: 3.8 mmol/L (ref 3.5–5.1)
Sodium: 125 mmol/L — ABNORMAL LOW (ref 135–145)

## 2020-09-01 LAB — MAGNESIUM: Magnesium: 1.8 mg/dL (ref 1.7–2.4)

## 2020-09-01 LAB — PHOSPHORUS: Phosphorus: 3.8 mg/dL (ref 2.5–4.6)

## 2020-09-01 SURGERY — LAPAROTOMY, EXPLORATORY
Anesthesia: General

## 2020-09-01 MED ORDER — ONDANSETRON HCL 4 MG/2ML IJ SOLN
INTRAMUSCULAR | Status: DC | PRN
Start: 2020-09-01 — End: 2020-09-02
  Administered 2020-09-01: 4 mg via INTRAVENOUS

## 2020-09-01 MED ORDER — OXYCODONE HCL 5 MG PO TABS
5.0000 mg | ORAL_TABLET | Freq: Four times a day (QID) | ORAL | Status: DC | PRN
  Administered 2020-09-01 – 2020-09-12 (×4): 5 mg via ORAL
  Filled 2020-09-01 (×5): qty 1

## 2020-09-01 MED ORDER — PROPOFOL 10 MG/ML IV BOLUS
INTRAVENOUS | Status: DC | PRN
  Administered 2020-09-01: 130 mg via INTRAVENOUS
  Administered 2020-09-02: 20 mg via INTRAVENOUS

## 2020-09-01 MED ORDER — LIDOCAINE 2% (20 MG/ML) 5 ML SYRINGE
INTRAMUSCULAR | Status: DC | PRN
  Administered 2020-09-01: 60 mg via INTRAVENOUS

## 2020-09-01 MED ORDER — HYDROMORPHONE HCL 1 MG/ML IJ SOLN
1.0000 mg | INTRAMUSCULAR | Status: DC | PRN
  Administered 2020-09-01: 1 mg via INTRAVENOUS
  Filled 2020-09-01: qty 1

## 2020-09-01 MED ORDER — INSULIN GLARGINE 100 UNIT/ML ~~LOC~~ SOLN
8.0000 [IU] | Freq: Every day | SUBCUTANEOUS | Status: DC
  Administered 2020-09-01 – 2020-09-07 (×7): 8 [IU] via SUBCUTANEOUS
  Filled 2020-09-01 (×8): qty 0.08

## 2020-09-01 MED ORDER — LACTATED RINGERS IV SOLN: INTRAVENOUS | Status: DC | PRN

## 2020-09-01 MED ORDER — HYDROMORPHONE HCL 1 MG/ML IJ SOLN
INTRAMUSCULAR | Status: DC | PRN
  Administered 2020-09-01 – 2020-09-02 (×2): 1 mg via INTRAVENOUS

## 2020-09-01 MED ORDER — FENTANYL CITRATE (PF) 100 MCG/2ML IJ SOLN
INTRAMUSCULAR | Status: DC | PRN
  Administered 2020-09-01: 50 ug via INTRAVENOUS
  Administered 2020-09-01: 100 ug via INTRAVENOUS
  Administered 2020-09-01 (×2): 50 ug via INTRAVENOUS

## 2020-09-01 MED ORDER — ACETAMINOPHEN 325 MG PO TABS: 650.0000 mg | ORAL_TABLET | Freq: Four times a day (QID) | ORAL | Status: DC | PRN

## 2020-09-01 MED ORDER — HYDROMORPHONE HCL 1 MG/ML IJ SOLN
0.5000 mg | INTRAMUSCULAR | Status: DC | PRN
  Administered 2020-09-01 – 2020-09-02 (×2): 0.5 mg via INTRAVENOUS
  Filled 2020-09-01 (×2): qty 0.5

## 2020-09-01 MED ORDER — SODIUM CHLORIDE 0.9 % IV SOLN: INTRAVENOUS | Status: AC

## 2020-09-01 MED ORDER — METOPROLOL TARTRATE 25 MG PO TABS
25.0000 mg | ORAL_TABLET | Freq: Two times a day (BID) | ORAL | Status: DC
  Administered 2020-09-01 – 2020-09-19 (×29): 25 mg via ORAL
  Filled 2020-09-01 (×30): qty 1

## 2020-09-01 MED ORDER — METRONIDAZOLE IN NACL 5-0.79 MG/ML-% IV SOLN
500.0000 mg | Freq: Three times a day (TID) | INTRAVENOUS | Status: AC
  Administered 2020-09-01 – 2020-09-05 (×13): 500 mg via INTRAVENOUS
  Filled 2020-09-01 (×13): qty 100

## 2020-09-01 MED ORDER — CIPROFLOXACIN IN D5W 400 MG/200ML IV SOLN
400.0000 mg | Freq: Two times a day (BID) | INTRAVENOUS | Status: AC
  Administered 2020-09-01 – 2020-09-05 (×9): 400 mg via INTRAVENOUS
  Filled 2020-09-01 (×10): qty 200

## 2020-09-01 MED ORDER — LIDOCAINE 5 % EX PTCH
1.0000 | MEDICATED_PATCH | CUTANEOUS | Status: DC
  Administered 2020-09-01 – 2020-09-28 (×28): 1 via TRANSDERMAL
  Filled 2020-09-01 (×29): qty 1

## 2020-09-01 MED ORDER — SUCCINYLCHOLINE CHLORIDE 200 MG/10ML IV SOSY
PREFILLED_SYRINGE | INTRAVENOUS | Status: DC | PRN
  Administered 2020-09-01: 100 mg via INTRAVENOUS

## 2020-09-01 MED ORDER — PROPOFOL 10 MG/ML IV BOLUS
INTRAVENOUS | Status: AC
  Filled 2020-09-01: qty 20

## 2020-09-01 MED ORDER — HYDROMORPHONE HCL 1 MG/ML IJ SOLN
INTRAMUSCULAR | Status: AC
  Filled 2020-09-01: qty 1

## 2020-09-01 MED ORDER — IOHEXOL 350 MG/ML SOLN
100.0000 mL | Freq: Once | INTRAVENOUS | Status: AC | PRN
  Administered 2020-09-01: 100 mL via INTRAVENOUS

## 2020-09-01 MED ORDER — FENTANYL CITRATE (PF) 250 MCG/5ML IJ SOLN
INTRAMUSCULAR | Status: AC
  Filled 2020-09-01: qty 5

## 2020-09-01 MED ORDER — MIDAZOLAM HCL 2 MG/2ML IJ SOLN
INTRAMUSCULAR | Status: AC
  Filled 2020-09-01: qty 2

## 2020-09-01 MED ORDER — ROCURONIUM BROMIDE 10 MG/ML (PF) SYRINGE
PREFILLED_SYRINGE | INTRAVENOUS | Status: DC | PRN
Start: 2020-09-01 — End: 2020-09-02
  Administered 2020-09-01: 50 mg via INTRAVENOUS

## 2020-09-01 MED ORDER — DOCUSATE SODIUM 100 MG PO CAPS: 100.0000 mg | ORAL_CAPSULE | Freq: Two times a day (BID) | ORAL | Status: DC

## 2020-09-01 MED ORDER — ALBUMIN HUMAN 5 % IV SOLN: INTRAVENOUS | Status: DC | PRN

## 2020-09-01 SURGICAL SUPPLY — 50 items
APL PRP STRL LF DISP 70% ISPRP (MISCELLANEOUS) ×1
CANISTER SUCT 3000ML PPV (MISCELLANEOUS) ×6 IMPLANT
CHLORAPREP W/TINT 26 (MISCELLANEOUS) ×2 IMPLANT
COVER SURGICAL LIGHT HANDLE (MISCELLANEOUS) ×2 IMPLANT
DRAPE LAPAROSCOPIC ABDOMINAL (DRAPES) ×2 IMPLANT
DRAPE WARM FLUID 44X44 (DRAPES) ×2 IMPLANT
DRSG OPSITE POSTOP 4X10 (GAUZE/BANDAGES/DRESSINGS) IMPLANT
DRSG OPSITE POSTOP 4X8 (GAUZE/BANDAGES/DRESSINGS) IMPLANT
DRSG VAC ATS MED SENSATRAC (GAUZE/BANDAGES/DRESSINGS) ×2 IMPLANT
ELECT BLADE 6.5 EXT (BLADE) IMPLANT
ELECT CAUTERY BLADE 6.4 (BLADE) ×2 IMPLANT
ELECT REM PT RETURN 9FT ADLT (ELECTROSURGICAL) ×2
ELECTRODE REM PT RTRN 9FT ADLT (ELECTROSURGICAL) ×1 IMPLANT
GAUZE SPONGE 4X4 12PLY STRL LF (GAUZE/BANDAGES/DRESSINGS) ×2 IMPLANT
GLOVE BIO SURGEON STRL SZ8 (GLOVE) ×4 IMPLANT
GLOVE BIOGEL M 8.0 STRL (GLOVE) ×2 IMPLANT
GLOVE SRG 8 PF TXTR STRL LF DI (GLOVE) ×1 IMPLANT
GLOVE SURG ENC MOIS LTX SZ7 (GLOVE) ×2 IMPLANT
GLOVE SURG UNDER POLY LF SZ8 (GLOVE) ×2
GOWN STRL REUS W/ TWL LRG LVL3 (GOWN DISPOSABLE) ×1 IMPLANT
GOWN STRL REUS W/ TWL XL LVL3 (GOWN DISPOSABLE) ×1 IMPLANT
GOWN STRL REUS W/TWL LRG LVL3 (GOWN DISPOSABLE) ×2
GOWN STRL REUS W/TWL XL LVL3 (GOWN DISPOSABLE) ×2
HANDLE SUCTION POOLE (INSTRUMENTS) ×1 IMPLANT
KIT BASIN OR (CUSTOM PROCEDURE TRAY) ×2 IMPLANT
KIT OSTOMY DRAINABLE 2.75 STR (WOUND CARE) ×2 IMPLANT
KIT TURNOVER KIT B (KITS) ×2 IMPLANT
LIGASURE IMPACT 36 18CM CVD LR (INSTRUMENTS) ×2 IMPLANT
NS IRRIG 1000ML POUR BTL (IV SOLUTION) ×4 IMPLANT
PACK GENERAL/GYN (CUSTOM PROCEDURE TRAY) ×2 IMPLANT
PAD ARMBOARD 7.5X6 YLW CONV (MISCELLANEOUS) ×2 IMPLANT
PAD NEG PRESSURE SENSATRAC (MISCELLANEOUS) ×2 IMPLANT
PENCIL SMOKE EVACUATOR (MISCELLANEOUS) ×2 IMPLANT
SPECIMEN JAR LARGE (MISCELLANEOUS) IMPLANT
SPONGE LAP 18X18 RF (DISPOSABLE) ×2 IMPLANT
STAPLER PROXIMATE 75MM BLUE (STAPLE) ×2 IMPLANT
STAPLER VISISTAT 35W (STAPLE) ×2 IMPLANT
SUCTION POOLE HANDLE (INSTRUMENTS) ×2
SUT PDS AB 1 TP1 96 (SUTURE) ×4 IMPLANT
SUT SILK 2 0 SH CR/8 (SUTURE) ×2 IMPLANT
SUT SILK 2 0 TIES 10X30 (SUTURE) ×2 IMPLANT
SUT SILK 3 0 SH CR/8 (SUTURE) ×2 IMPLANT
SUT SILK 3 0 TIES 10X30 (SUTURE) ×2 IMPLANT
SUT VIC AB 3-0 SH 8-18 (SUTURE) ×2 IMPLANT
SWAB COLLECTION DEVICE MRSA (MISCELLANEOUS) ×2 IMPLANT
SWAB CULTURE ESWAB REG 1ML (MISCELLANEOUS) ×2 IMPLANT
TAPE PAPER 3X10 WHT MICROPORE (GAUZE/BANDAGES/DRESSINGS) ×2 IMPLANT
TOWEL GREEN STERILE (TOWEL DISPOSABLE) ×2 IMPLANT
TRAY FOLEY W/BAG SLVR 14FR (SET/KITS/TRAYS/PACK) ×2 IMPLANT
YANKAUER SUCT BULB TIP NO VENT (SUCTIONS) IMPLANT

## 2020-09-01 NOTE — Anesthesia Preprocedure Evaluation (Addendum)
Anesthesia Evaluation  Patient identified by MRN, date of birth, ID band Patient awake    Reviewed: Allergy & Precautions, H&P , NPO status , Patient's Chart, lab work & pertinent test results, reviewed documented beta blocker date and time   Airway Mallampati: III  TM Distance: >3 FB Neck ROM: Full    Dental no notable dental hx. (+) Edentulous Upper, Edentulous Lower, Dental Advisory Given   Pulmonary sleep apnea ,    Pulmonary exam normal breath sounds clear to auscultation       Cardiovascular hypertension, Pt. on medications and Pt. on home beta blockers  Rhythm:Regular Rate:Tachycardia     Neuro/Psych Seizures -,  CVA negative psych ROS   GI/Hepatic Neg liver ROS, GERD  Medicated,  Endo/Other  diabetes, Insulin Dependent  Renal/GU negative Renal ROS  negative genitourinary   Musculoskeletal  (+) Arthritis , Osteoarthritis,    Abdominal   Peds  Hematology negative hematology ROS (+)   Anesthesia Other Findings   Reproductive/Obstetrics negative OB ROS                            Anesthesia Physical Anesthesia Plan  ASA: III and emergent  Anesthesia Plan: General   Post-op Pain Management:    Induction: Intravenous, Rapid sequence and Cricoid pressure planned  PONV Risk Score and Plan: 4 or greater and Ondansetron, Dexamethasone, Midazolam and Treatment may vary due to age or medical condition  Airway Management Planned: Oral ETT  Additional Equipment:   Intra-op Plan:   Post-operative Plan: Extubation in OR  Informed Consent: I have reviewed the patients History and Physical, chart, labs and discussed the procedure including the risks, benefits and alternatives for the proposed anesthesia with the patient or authorized representative who has indicated his/her understanding and acceptance.     Dental advisory given  Plan Discussed with: CRNA  Anesthesia Plan Comments:          Anesthesia Quick Evaluation

## 2020-09-01 NOTE — Anesthesia Procedure Notes (Signed)
Procedure Name: Intubation Date/Time: 09/01/2020 10:47 PM Performed by: Babs Bertin, CRNA Pre-anesthesia Checklist: Patient identified, Emergency Drugs available, Suction available and Patient being monitored Patient Re-evaluated:Patient Re-evaluated prior to induction Oxygen Delivery Method: Circle System Utilized Preoxygenation: Pre-oxygenation with 100% oxygen Induction Type: IV induction, Rapid sequence and Cricoid Pressure applied Laryngoscope Size: Mac and 3 Grade View: Grade I Tube type: Oral Tube size: 7.0 mm Number of attempts: 1 Airway Equipment and Method: Stylet and Oral airway Placement Confirmation: ETT inserted through vocal cords under direct vision,  positive ETCO2 and breath sounds checked- equal and bilateral Secured at: 21 cm Tube secured with: Tape Dental Injury: Teeth and Oropharynx as per pre-operative assessment

## 2020-09-01 NOTE — Progress Notes (Signed)
Upon assessment, patient states pain is 6 out of 10 in her mid lower back. She requested something for pain. She has no prn medications for pain at this time. [facility] called and notified. Prn tylenol was ordered.

## 2020-09-01 NOTE — Progress Notes (Signed)
Pt transferred from 49M to 5W 36. Pt is A&Ox4. Skin clean dry and intact. VSS BP (!) 146/88 (BP Location: Left Arm)   Pulse 89   Temp 98.3 F (36.8 C) (Oral)   Resp 15   Ht 5\' 3"  (1.6 m)   Wt 83.1 kg   SpO2 100%   BMI 32.45 kg/m  pt oriented to room call bell in place and bed in lowest position

## 2020-09-01 NOTE — Progress Notes (Signed)
PROGRESS NOTE    KAYDYN CHISM  JQB:[id number] DOB: 05/31/1960 DOA: 08/29/2020 PCP: [facility] Narrative:60 year old female with history of hypertension, obesity, obstructive sleep apnea, not using CPAP, neurosarcoidosis on chronic prednisone therapy, wheelchair-bound due to chronic debility, recurrent GI bleeding with recent multiple hospitalizations presented back to the emergency room with frank rectal bleeding and hypotension.  3/8, admission with rectal bleeding, EGD and colonoscopy negative.  Capsule endoscopy with erythema in the small bowel. 4/4-4/6 admitted with hematochezia, symptomatically treated, capsule endoscopy and discharged home. 4/13 presented with hematochezia, CT angiogram with acute bleeding in the descending colon status post coil embolization of the distal [place] branch of IMA.  Insulin infusion per blood sugars.  Admitted to ICU. 4/15, transferred out of ICU. Patient had 1 small bloody bowel movement, hemoglobin dropped to 7-doings PRBC overnight.    Assessment & Plan:   Active Problems:   GI bleed   Hypotension   Rectal bleeding   AKI (acute kidney injury) ([place])  Acute lower GI bleeding, diverticular bleeding.  Hemorrhagic shock secondary to acute GI bleeding and anemia of blood loss: Known diverticular disease. status post coil embolization 4/13.  Hemodynamically stabilizing since then.   Baseline hemoglobin 9.9-7.9 on presentation-2 units PRBC given-7.5-2 additional PRBC today. Total 4 units of PRBC, latest hemoglobin 10.3.  We will continue to monitor.  Blood pressure stabilized. If any acute bleeding, GI has recommended repeat angiogram.  Acute renal failure: Due to #1.  Improved and normalized.  Type 2 diabetes with hyperglycemia: Now eating regular diet.  Off insulin drip.  Increased dose of long-acting insulin to her home doses.  Gradually uptitrate.  Neurosarcoidosis/chronic debility and wheelchair-bound status: Patient  on prednisone 40 mg twice a day that she will continue.  Also on gabapentin.  Obstructive sleep apnea: Declined CPAP.  Does not use at home so not using in the hospital.  Hypertension: On metoprolol.  On hold due to hypotension.  Blood pressures are adequate.  Will resume metoprolol.  Hypochloremic hyponatremia: Probably dilutional.  Also component of pseudohyponatremia due to high blood sugars.  Electrolytes are adequate.  Monitor.  Advance activities.  Regular diet.  Can transfer to medical floor with telemetry.  Recheck hemoglobin tomorrow morning.    DVT prophylaxis: SCDs Start: 08/29/20 2256   Code Status: Full code Family Communication: None at the bedside.  Husband on the phone 4/15. Disposition Plan: Status is: Inpatient  Remains inpatient appropriate because:Inpatient level of care appropriate due to severity of illness   Dispo: The patient is from: Home              Anticipated d/c is to: Home              Patient currently is not medically stable to d/c.   Difficult to place patient no         Consultants:   PCCM  Gastroenterology  Interventional radiology  Procedures:   Embolization inferior mesenteric artery 4/13  Antimicrobials:   None   Subjective: Patient seen and examined.  Overnight events as per patient.  She herself denied any nausea vomiting or abdominal pain. She has some shoulder pain which is not relieved with lidocaine patch. She had 1 very small bloody bowel movement late last night, hemoglobin was 7 and patient was started on 2 units of PRBC.  Posttransfusion hemoglobin is 10.  No more bowel movements yet.  Objective: Vitals:   09/01/20 0700 09/01/20 0715 09/01/20 0800 09/01/20 0900  BP: 135/88 128/86 (!) 150/104 137/87  Pulse: 94 99 92 93  Resp: (!) 23 (!) 22 (!) 27 (!) 23  Temp:  98 F (36.7 C)    TempSrc:  Oral    SpO2: 100% 100% 100% 100%  Weight:      Height:        Intake/Output Summary (Last 24 hours) at 09/01/2020  1104 Last data filed at 09/01/2020 0800 Gross per 24 hour  Intake 1131 ml  Output 3700 ml  Net -2569 ml   Filed Weights   08/30/20 0022 08/30/20 0500 09/01/20 0448  Weight: 79.5 kg 79.5 kg 83.1 kg    Examination:  General exam: Appears calm and comfortable Chronically sick looking.  Not in any distress.  She is cushingoid with puffy face. Respiratory system: Clear to auscultation. Respiratory effort normal. Cardiovascular system: S1 & S2 heard, RRR. No JVD, murmurs, rubs, gallops or clicks. No pedal edema. Gastrointestinal system: Abdomen is nondistended, soft and nontender. No organomegaly or masses felt. Normal bowel sounds heard. Central nervous system: Alert and oriented. No focal neurological deficits.    Data Reviewed: I have personally reviewed following labs and imaging studies  CBC: Recent Labs  Lab 08/29/20 2003 08/30/20 0118 08/30/20 0600 08/30/20 1349 08/31/20 0048 08/31/20 1614 08/31/20 2209 09/01/20 0920  WBC 9.0  --  17.3*  --  16.7* 13.9*  --  13.2*  NEUTROABS 7.6  --   --   --   --  12.3*  --  11.7*  HGB 8.1*   < > 8.9* 9.2* 7.9* 7.5* 7.0* 10.3*  10.4*  HCT 26.3*   < > 27.4* 28.0* 23.7* 23.5* 21.8* 30.8*  30.8*  MCV 101.5*  --  93.8  --  94.8 95.5  --  91.1  PLT 216  --  157  --  144* 124*  --  110*   < > = values in this interval not displayed.   Basic Metabolic Panel: Recent Labs  Lab 08/29/20 2003 08/30/20 0600 08/31/20 0048 08/31/20 1614 09/01/20 0920  NA 124* 136 124* 124*  --   K 4.8 4.1 4.9 4.3  --   CL 89* 99 89* 91*  --   CO2 23 31 30 26   --   GLUCOSE 616* 146* 280* 223*  --   BUN 10 11 9 11   --   CREATININE 1.04* 0.82 0.62 0.73  --   CALCIUM 8.1* 8.4* 8.3* 7.8*  --   MG  --  1.7 2.0  --  1.8  PHOS  --  2.1* 4.0  --  3.8   GFR: Estimated Creatinine Clearance: 77.3 mL/min (by C-G formula based on SCr of 0.73 mg/dL). Liver Function Tests: Recent Labs  Lab 08/29/20 2003  AST 37  ALT <5  ALKPHOS 137*  BILITOT 0.7  PROT  4.5*  ALBUMIN 2.6*   No results for input(s): LIPASE, AMYLASE in the last 168 hours. No results for input(s): AMMONIA in the last 168 hours. Coagulation Profile: Recent Labs  Lab 08/29/20 2003  INR 1.1   Cardiac Enzymes: No results for input(s): CKTOTAL, CKMB, CKMBINDEX, TROPONINI in the last 168 hours. BNP (last 3 results) No results for input(s): PROBNP in the last 8760 hours. HbA1C: No results for input(s): HGBA1C in the last 72 hours. CBG: Recent Labs  Lab 08/31/20 1658 08/31/20 1951 08/31/20 2310 09/01/20 0348 09/01/20 0712  GLUCAP 261* 275* 219* 229* 197*   Lipid Profile: No results for input(s): CHOL, HDL, LDLCALC, TRIG,  CHOLHDL, LDLDIRECT in the last 72 hours. Thyroid Function Tests: No results for input(s): TSH, T4TOTAL, FREET4, T3FREE, THYROIDAB in the last 72 hours. Anemia Panel: Recent Labs    08/29/20 2017  VITAMINB12 2,853*  FOLATE 31.2  FERRITIN 170  TIBC 217*  IRON 72  RETICCTPCT 5.1*   Sepsis Labs: No results for input(s): PROCALCITON, LATICACIDVEN in the last 168 hours.  Recent Results (from the past 240 hour(s))  Resp Panel by RT-PCR (Flu A&B, Covid) Nasopharyngeal Swab     Status: None   Collection Time: 08/29/20  8:30 PM   Specimen: Nasopharyngeal Swab; Nasopharyngeal(NP) swabs in vial transport medium  Result Value Ref Range Status   SARS Coronavirus 2 by RT PCR NEGATIVE NEGATIVE Final    Comment: (NOTE) SARS-CoV-2 target nucleic acids are NOT DETECTED.  The SARS-CoV-2 RNA is generally detectable in upper respiratory specimens during the acute phase of infection. The lowest concentration of SARS-CoV-2 viral copies this assay can detect is 138 copies/mL. A negative result does not preclude SARS-Cov-2 infection and should not be used as the sole basis for treatment or other patient management decisions. A negative result may occur with  improper specimen collection/handling, submission of specimen other than nasopharyngeal swab,  presence of viral mutation(s) within the areas targeted by this assay, and inadequate number of viral copies(<138 copies/mL). A negative result must be combined with clinical observations, patient history, and epidemiological information. The expected result is Negative.  Fact Sheet for Patients:  EntrepreneurPulse.com.au  Fact Sheet for Healthcare Providers:  IncredibleEmployment.be  This test is no t yet approved or cleared by the [place] FDA and  has been authorized for detection and/or diagnosis of SARS-CoV-2 by FDA under an Emergency Use Authorization (EUA). This EUA will remain  in effect (meaning this test can be used) for the duration of the COVID-19 declaration under Section 564(b)(1) of the Act, 21 U.S.C.section 360bbb-3(b)(1), unless the authorization is terminated  or revoked sooner.       Influenza A by PCR NEGATIVE NEGATIVE Final   Influenza B by PCR NEGATIVE NEGATIVE Final    Comment: (NOTE) The Xpert Xpress SARS-CoV-2/FLU/RSV plus assay is intended as an aid in the diagnosis of influenza from Nasopharyngeal swab specimens and should not be used as a sole basis for treatment. Nasal washings and aspirates are unacceptable for Xpert Xpress SARS-CoV-2/FLU/RSV testing.  Fact Sheet for Patients: EntrepreneurPulse.com.au  Fact Sheet for Healthcare Providers: IncredibleEmployment.be  This test is not yet approved or cleared by the [place] FDA and has been authorized for detection and/or diagnosis of SARS-CoV-2 by FDA under an Emergency Use Authorization (EUA). This EUA will remain in effect (meaning this test can be used) for the duration of the COVID-19 declaration under Section 564(b)(1) of the Act, 21 U.S.C. section 360bbb-3(b)(1), unless the authorization is terminated or revoked.  Performed at [facility], [place] [address] [place], [place] [address]   MRSA PCR Screening      Status: Abnormal   Collection Time: 08/30/20 12:41 AM   Specimen: Nasopharyngeal  Result Value Ref Range Status   MRSA by PCR POSITIVE (A) NEGATIVE Final    Comment:        The GeneXpert MRSA Assay (FDA approved for NASAL specimens only), is one component of a comprehensive MRSA colonization surveillance program. It is not intended to diagnose MRSA infection nor to guide or monitor treatment for MRSA infections. RESULT CALLED TO, READ BACK BY AND VERIFIED WITH: H HONEYCUTT RN 08/30/20 0503 JDW  Performed at [facility], [place] [address] [place], [place] [address]          Radiology Studies: No results found.      Scheduled Meds: . Chlorhexidine Gluconate Cloth  6 each Topical Daily  . gabapentin  100 mg Oral TID  . insulin aspart  0-15 Units Subcutaneous Q4H  . insulin aspart  3 Units Subcutaneous TID WC  . insulin glargine  18 Units Subcutaneous QHS  . lidocaine  1 patch Transdermal Q24H  . mouth rinse  15 mL Mouth Rinse BID  . metoprolol tartrate  25 mg Oral BID  . mupirocin ointment  1 application Nasal BID  . pantoprazole (PROTONIX) IV  40 mg Intravenous Q24H  . predniSONE  40 mg Oral BID WC   Continuous Infusions:   LOS: 3 days    Time spent: 34 minutes.    Barb Merino, MD [facility] Pager [phone]

## 2020-09-01 NOTE — Progress Notes (Signed)
Pt complaining of 10/10 abdominal pain abdomen is distended and tender to touch, Dr. Sloan Leiter notified

## 2020-09-01 NOTE — Progress Notes (Signed)
Dr. Sloan Leiter notified of CT results, verbal order to make NPO and start normal saline @125 

## 2020-09-01 NOTE — Consult Note (Signed)
Reason for Consult:free air Referring Physician: Vicente Serene is an 60 y.o. female.  HPI: 60yo F admitted with LGIB 4/13. This had been going on for a month on and off. This admit CT angio showed bleeding in her L colon. She underwent IR angioembolization of a branch of the IMA 4/13. Earlier today she developed severe abdominal pain. CT A/P shows SBO and a large amount of free air. I was asked to see her for surgical management.  Past Medical History:  Diagnosis Date  . Arthritis   . Degenerative disc disease, lumbar   . GERD (gastroesophageal reflux disease)   . Hypertension   . OSA (obstructive sleep apnea)    not currently on CPAP  . Prediabetes   . Sarcoidosis of central nervous system   . Seizures ([place])   . Type 2   . Urinary incontinence     Past Surgical History:  Procedure Laterality Date  . ABDOMINAL HYSTERECTOMY    . BREAST EXCISIONAL BIOPSY Right   . BREAST SURGERY     right breast  . CESAREAN SECTION    . COLONOSCOPY WITH PROPOFOL N/A 07/26/2020   Procedure: COLONOSCOPY WITH PROPOFOL;  Surgeon: [place] Ada, MD;  Location: [facility];  Service: Endoscopy;  Laterality: N/A;  . ESOPHAGOGASTRODUODENOSCOPY (EGD) WITH PROPOFOL N/A 07/24/2020   Procedure: ESOPHAGOGASTRODUODENOSCOPY (EGD) WITH PROPOFOL;  Surgeon: [place] Ada, MD;  Location: [facility];  Service: Endoscopy;  Laterality: N/A;  Bleeding with anemia-5 gms drop in hemoglobin  . GIVENS CAPSULE STUDY N/A 08/21/2020   Procedure: GIVENS CAPSULE STUDY;  Surgeon: Juanita Craver, MD;  Location: [facility] ENDOSCOPY;  Service: Endoscopy;  Laterality: N/A;  . IR ANGIOGRAM FOLLOW UP STUDY  08/29/2020  . IR ANGIOGRAM SELECTIVE EACH ADDITIONAL VESSEL  08/29/2020  . IR ANGIOGRAM VISCERAL SELECTIVE  08/29/2020  . IR ANGIOGRAM VISCERAL SELECTIVE  08/29/2020  . IR EMBO ART  VEN HEMORR LYMPH EXTRAV  INC GUIDE ROADMAPPING  08/29/2020  . IR US GUIDE VASC ACCESS RIGHT  08/29/2020    Family History  Problem Relation Age of  Onset  . Cancer Mother   . Hypertension Sister   . Diabetes Sister   . Diabetes Maternal Grandmother     Social History:  reports that she has never smoked. She has never used smokeless tobacco. She reports that she does not drink alcohol and does not use drugs.  Allergies:  Allergies  Allergen Reactions  . Augmentin [Amoxicillin-Pot Clavulanate] Swelling and Other (See Comments)    Hand swelled at IV site  . Acetaminophen Other (See Comments)    Patient states tylenol makes her sick.     Medications: I have reviewed the patient's current medications.  Results for orders placed or performed during the hospital encounter of 08/29/20 (from the past 48 hour(s))  Glucose, capillary     Status: Abnormal   Collection Time: 08/30/20  7:11 PM  Result Value Ref Range   Glucose-Capillary 334 (H) 70 - 99 mg/dL    Comment: Glucose reference range applies only to samples taken after fasting for at least 8 hours.  Glucose, capillary     Status: Abnormal   Collection Time: 08/30/20 11:33 PM  Result Value Ref Range   Glucose-Capillary 307 (H) 70 - 99 mg/dL    Comment: Glucose reference range applies only to samples taken after fasting for at least 8 hours.  CBC     Status: Abnormal   Collection Time: 08/31/20 12:48 AM  Result Value Ref Range  WBC 16.7 (H) 4.0 - 10.5 K/uL   RBC 2.50 (L) 3.87 - 5.11 MIL/uL   Hemoglobin 7.9 (L) 12.0 - 15.0 g/dL   HCT 23.7 (L) 36.0 - 46.0 %   MCV 94.8 80.0 - 100.0 fL   MCH 31.6 26.0 - 34.0 pg   MCHC 33.3 30.0 - 36.0 g/dL   RDW 18.1 (H) 11.5 - 15.5 %   Platelets 144 (L) 150 - 400 K/uL   nRBC 3.2 (H) 0.0 - 0.2 %    Comment: REPEATED TO VERIFY Performed at [facility] [address], [place], [place] [address]   Basic metabolic panel     Status: Abnormal   Collection Time: 08/31/20 12:48 AM  Result Value Ref Range   Sodium 124 (L) 135 - 145 mmol/L   Potassium 4.9 3.5 - 5.1 mmol/L   Chloride 89 (L) 98 - 111 mmol/L   CO2 30 22 - 32 mmol/L    Glucose, Bld 280 (H) 70 - 99 mg/dL    Comment: Glucose reference range applies only to samples taken after fasting for at least 8 hours.   BUN 9 6 - 20 mg/dL   Creatinine, Ser 0.62 0.44 - 1.00 mg/dL   Calcium 8.3 (L) 8.9 - 10.3 mg/dL   GFR, Estimated >60 >60 mL/min    Comment: (NOTE) Calculated using the CKD-EPI Creatinine Equation (2021)    Anion gap 5 5 - 15    Comment: Performed at [facility] [address] [place], [place] [address]  Magnesium     Status: None   Collection Time: 08/31/20 12:48 AM  Result Value Ref Range   Magnesium 2.0 1.7 - 2.4 mg/dL    Comment: Performed at [facility] [address] [place], [place] [address]  Phosphorus     Status: None   Collection Time: 08/31/20 12:48 AM  Result Value Ref Range   Phosphorus 4.0 2.5 - 4.6 mg/dL    Comment: Performed at [facility] [address] [place], [place] [address]  Glucose, capillary     Status: Abnormal   Collection Time: 08/31/20  3:20 AM  Result Value Ref Range   Glucose-Capillary 241 (H) 70 - 99 mg/dL    Comment: Glucose reference range applies only to samples taken after fasting for at least 8 hours.  Glucose, capillary     Status: Abnormal   Collection Time: 08/31/20  8:10 AM  Result Value Ref Range   Glucose-Capillary 171 (H) 70 - 99 mg/dL    Comment: Glucose reference range applies only to samples taken after fasting for at least 8 hours.  Glucose, capillary     Status: Abnormal   Collection Time: 08/31/20 11:38 AM  Result Value Ref Range   Glucose-Capillary 146 (H) 70 - 99 mg/dL    Comment: Glucose reference range applies only to samples taken after fasting for at least 8 hours.  CBC with Differential/Platelet     Status: Abnormal   Collection Time: 08/31/20  4:14 PM  Result Value Ref Range   WBC 13.9 (H) 4.0 - 10.5 K/uL   RBC 2.46 (L) 3.87 - 5.11 MIL/uL   Hemoglobin 7.5 (L) 12.0 - 15.0 g/dL   HCT 23.5 (L) 36.0 - 46.0 %   MCV 95.5 80.0 - 100.0 fL   MCH 30.5 26.0 - 34.0  pg   MCHC 31.9 30.0 - 36.0 g/dL   RDW 17.8 (H) 11.5 - 15.5 %   Platelets 124 (L)  150 - 400 K/uL   nRBC 2.7 (H) 0.0 - 0.2 %   Neutrophils Relative % 89 %   Neutro Abs 12.3 (H) 1.7 - 7.7 K/uL   Lymphocytes Relative 6 %   Lymphs Abs 0.8 0.7 - 4.0 K/uL   Monocytes Relative 3 %   Monocytes Absolute 0.5 0.1 - 1.0 K/uL   Eosinophils Relative 0 %   Eosinophils Absolute 0.0 0.0 - 0.5 K/uL   Basophils Relative 0 %   Basophils Absolute 0.0 0.0 - 0.1 K/uL   Immature Granulocytes 2 %   Abs Immature Granulocytes 0.30 (H) 0.00 - 0.07 K/uL    Comment: Performed at [facility] [address] [place], [place] [address]  Basic metabolic panel     Status: Abnormal   Collection Time: 08/31/20  4:14 PM  Result Value Ref Range   Sodium 124 (L) 135 - 145 mmol/L   Potassium 4.3 3.5 - 5.1 mmol/L   Chloride 91 (L) 98 - 111 mmol/L   CO2 26 22 - 32 mmol/L   Glucose, Bld 223 (H) 70 - 99 mg/dL    Comment: Glucose reference range applies only to samples taken after fasting for at least 8 hours.   BUN 11 6 - 20 mg/dL   Creatinine, Ser 0.73 0.44 - 1.00 mg/dL   Calcium 7.8 (L) 8.9 - 10.3 mg/dL   GFR, Estimated >60 >60 mL/min    Comment: (NOTE) Calculated using the CKD-EPI Creatinine Equation (2021)    Anion gap 7 5 - 15    Comment: Performed at [facility] [address] [place], [place] [address]  Glucose, capillary     Status: Abnormal   Collection Time: 08/31/20  4:58 PM  Result Value Ref Range   Glucose-Capillary 261 (H) 70 - 99 mg/dL    Comment: Glucose reference range applies only to samples taken after fasting for at least 8 hours.  Glucose, capillary     Status: Abnormal   Collection Time: 08/31/20  7:51 PM  Result Value Ref Range   Glucose-Capillary 275 (H) 70 - 99 mg/dL    Comment: Glucose reference range applies only to samples taken after fasting for at least 8 hours.  Hemoglobin and hematocrit, blood     Status: Abnormal   Collection Time: 08/31/20 10:09 PM  Result Value  Ref Range   Hemoglobin 7.0 (L) 12.0 - 15.0 g/dL    Comment: REPEATED TO VERIFY   HCT 21.8 (L) 36.0 - 46.0 %    Comment: Performed at [facility] [address] [place], [place] [address]  Glucose, capillary     Status: Abnormal   Collection Time: 08/31/20 11:10 PM  Result Value Ref Range   Glucose-Capillary 219 (H) 70 - 99 mg/dL    Comment: Glucose reference range applies only to samples taken after fasting for at least 8 hours.  Glucose, capillary     Status: Abnormal   Collection Time: 09/01/20  3:48 AM  Result Value Ref Range   Glucose-Capillary 229 (H) 70 - 99 mg/dL    Comment: Glucose reference range applies only to samples taken after fasting for at least 8 hours.  Glucose, capillary     Status: Abnormal   Collection Time: 09/01/20  7:12 AM  Result Value Ref Range   Glucose-Capillary 197 (H) 70 - 99 mg/dL    Comment: Glucose reference range applies only to samples taken after fasting for at least 8 hours.  CBC with Differential/Platelet  Status: Abnormal   Collection Time: 09/01/20  9:20 AM  Result Value Ref Range   WBC 13.2 (H) 4.0 - 10.5 K/uL   RBC 3.38 (L) 3.87 - 5.11 MIL/uL   Hemoglobin 10.3 (L) 12.0 - 15.0 g/dL    Comment: REPEATED TO VERIFY   HCT 30.8 (L) 36.0 - 46.0 %   MCV 91.1 80.0 - 100.0 fL   MCH 30.5 26.0 - 34.0 pg   MCHC 33.4 30.0 - 36.0 g/dL   RDW 17.2 (H) 11.5 - 15.5 %   Platelets 110 (L) 150 - 400 K/uL    Comment: Immature Platelet Fraction may be clinically indicated, consider ordering this additional test [id number] REPEATED TO VERIFY PLATELET COUNT CONFIRMED BY SMEAR    nRBC 2.6 (H) 0.0 - 0.2 %   Neutrophils Relative % 88 %   Neutro Abs 11.7 (H) 1.7 - 7.7 K/uL   Lymphocytes Relative 6 %   Lymphs Abs 0.7 0.7 - 4.0 K/uL   Monocytes Relative 3 %   Monocytes Absolute 0.4 0.1 - 1.0 K/uL   Eosinophils Relative 0 %   Eosinophils Absolute 0.0 0.0 - 0.5 K/uL   Basophils Relative 0 %   Basophils Absolute 0.0 0.0 - 0.1 K/uL   Immature  Granulocytes 3 %   Abs Immature Granulocytes 0.35 (H) 0.00 - 0.07 K/uL    Comment: Performed at [facility], [address], [place], [place] [address]  Magnesium     Status: None   Collection Time: 09/01/20  9:20 AM  Result Value Ref Range   Magnesium 1.8 1.7 - 2.4 mg/dL    Comment: Performed at [facility] [address], [place], [place] [address]  Phosphorus     Status: None   Collection Time: 09/01/20  9:20 AM  Result Value Ref Range   Phosphorus 3.8 2.5 - 4.6 mg/dL    Comment: Performed at [facility] [address] [place], [place] [address]  Hemoglobin and hematocrit, blood     Status: Abnormal   Collection Time: 09/01/20  9:20 AM  Result Value Ref Range   Hemoglobin 10.4 (L) 12.0 - 15.0 g/dL    Comment: REPEATED TO VERIFY   HCT 30.8 (L) 36.0 - 46.0 %    Comment: Performed at [facility] [address], [place], [place] [address]  Glucose, capillary     Status: Abnormal   Collection Time: 09/01/20 11:09 AM  Result Value Ref Range   Glucose-Capillary 245 (H) 70 - 99 mg/dL    Comment: Glucose reference range applies only to samples taken after fasting for at least 8 hours.  CBC with Differential/Platelet     Status: Abnormal   Collection Time: 09/01/20  2:22 PM  Result Value Ref Range   WBC 17.1 (H) 4.0 - 10.5 K/uL   RBC 3.85 (L) 3.87 - 5.11 MIL/uL   Hemoglobin 11.8 (L) 12.0 - 15.0 g/dL   HCT 34.7 (L) 36.0 - 46.0 %   MCV 90.1 80.0 - 100.0 fL   MCH 30.6 26.0 - 34.0 pg   MCHC 34.0 30.0 - 36.0 g/dL   RDW 17.5 (H) 11.5 - 15.5 %   Platelets 122 (L) 150 - 400 K/uL   nRBC 2.1 (H) 0.0 - 0.2 %   Neutrophils Relative % 88 %   Neutro Abs 15.1 (H) 1.7 - 7.7 K/uL   Lymphocytes Relative 5 %   Lymphs Abs 0.9 0.7 - 4.0 K/uL   Monocytes Relative  4 %   Monocytes Absolute 0.6 0.1 - 1.0 K/uL   Eosinophils Relative 0 %   Eosinophils Absolute 0.0 0.0 - 0.5 K/uL   Basophils Relative 0 %   Basophils Absolute 0.0 0.0 - 0.1 K/uL   Immature Granulocytes 3 %    Abs Immature Granulocytes 0.43 (H) 0.00 - 0.07 K/uL    Comment: Performed at [facility] [address], [place], [place] [address]  Basic metabolic panel     Status: Abnormal   Collection Time: 09/01/20  2:22 PM  Result Value Ref Range   Sodium 125 (L) 135 - 145 mmol/L   Potassium 3.8 3.5 - 5.1 mmol/L   Chloride 86 (L) 98 - 111 mmol/L   CO2 26 22 - 32 mmol/L   Glucose, Bld 173 (H) 70 - 99 mg/dL    Comment: Glucose reference range applies only to samples taken after fasting for at least 8 hours.   BUN 14 6 - 20 mg/dL   Creatinine, Ser 0.59 0.44 - 1.00 mg/dL   Calcium 8.5 (L) 8.9 - 10.3 mg/dL   GFR, Estimated >60 >60 mL/min    Comment: (NOTE) Calculated using the CKD-EPI Creatinine Equation (2021)    Anion gap 13 5 - 15    Comment: Performed at [facility] [address], [place], [place] [address]  Glucose, capillary     Status: Abnormal   Collection Time: 09/01/20  4:29 PM  Result Value Ref Range   Glucose-Capillary 208 (H) 70 - 99 mg/dL    Comment: Glucose reference range applies only to samples taken after fasting for at least 8 hours.    CT ABDOMEN PELVIS W CONTRAST  Result Date: 09/01/2020 CLINICAL DATA:  Abdominal pain and distension, history of recent GI bleed with embolization EXAM: CT ABDOMEN AND PELVIS WITH CONTRAST TECHNIQUE: Multidetector CT imaging of the abdomen and pelvis was performed using the standard protocol following bolus administration of intravenous contrast. CONTRAST:  144mL OMNIPAQUE IOHEXOL 350 MG/ML SOLN COMPARISON:  08/29/2020 FINDINGS: Lower chest: Mild bibasilar atelectasis is noted. 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 Hepatobiliary: The liver is diffusely fatty infiltrated. No focal mass lesion is seen. The gallbladder is within normal limits. Pancreas: Pancreas is unremarkable. Spleen: Normal in size without focal abnormality. Adrenals/Urinary Tract: Adrenal glands are within normal limits.  Normal enhancement of the kidneys is seen. Normal excretion is noted bilaterally. Small cyst is noted in the left kidney stable from the previous exam. No obstructive changes are seen. The bladder is well distended. Stomach/Bowel: Diverticular changes noted within the colon. Changes of prior embolization are seen. The colon is predominately decompressed. The appendix is within normal limits. Considerable free air is noted within the anterior aspect of the abdomen. The stomach is well distended with ingested food stuffs. Considerable dilatation of the jejunum is seen there is a caliber change noted in the mid abdomen best seen on image number 65 of series 3. The more distal small bowel appears within normal limits. Terminal ileum is unremarkable. Vascular/Lymphatic: Atherosclerotic calcifications of the aorta are noted. No aneurysmal dilatation is seen. Changes of prior IMA embolization are noted consistent with the given clinical history. The proximal IMA is within normal limits. SMA appears within normal limits as well. No significant lymphadenopathy is noted. Reproductive: Status post hysterectomy. No adnexal masses. Other: Mild free fluid is noted within the pelvis. This is likely related to the underlying perforation. Musculoskeletal: Degenerative changes  of the lumbar spine are noted. IMPRESSION: Interval development of partial small bowel obstruction involving the distal jejunum with evidence of perforation with free air and free fluid within the abdomen. These changes are new from the prior CT. A transition zone is noted in the mid abdomen although no definitive mass lesion is seen. Changes of prior embolization of the IMA branches. No findings to suggest active extravasation are seen. These results will be called to the ordering clinician or representative by the Radiologist Assistant, and communication documented in the PACS or [employer]. Electronically Signed   By: Inez Catalina M.D.   On: 09/01/2020  17:37    Review of Systems  Constitutional: Negative.   HENT: Negative.   Eyes: Negative.   Respiratory: Negative.   Cardiovascular: Negative for chest pain.  Gastrointestinal: Positive for abdominal pain and blood in stool.  Endocrine: Negative.   Genitourinary: Negative.   Musculoskeletal: Negative.   Skin: Negative.   Allergic/Immunologic: Negative.   Neurological: Negative.   Hematological: Negative.   Psychiatric/Behavioral: Negative.    Blood pressure (!) 125/97, pulse 94, temperature 99 F (37.2 C), temperature source Oral, resp. rate 18, height 5\' 3"  (1.6 m), weight 83.1 kg, SpO2 100 %. Physical Exam Constitutional:      General: She is not in acute distress. HENT:     Head: Normocephalic.     Right Ear: External ear normal.     Left Ear: External ear normal.     Nose: Nose normal.     Mouth/Throat:     Mouth: Mucous membranes are dry.  Eyes:     General: No scleral icterus.    Pupils: Pupils are equal, round, and reactive to light.  Cardiovascular:     Rate and Rhythm: Normal rate and regular rhythm.     Heart sounds: Normal heart sounds.  Pulmonary:     Effort: Pulmonary effort is normal.     Breath sounds: Normal breath sounds.  Abdominal:     General: Abdomen is flat.     Tenderness: There is abdominal tenderness. There is no guarding or rebound.     Comments: Significant mid abd tenderness  Musculoskeletal:        General: No tenderness.     Cervical back: Neck supple. No tenderness.  Skin:    General: Skin is warm.     Capillary Refill: Capillary refill takes 2 to 3 seconds.  Neurological:     Mental Status: She is alert and oriented to person, place, and time.  Psychiatric:        Mood and Affect: Mood normal.     Assessment/Plan: LGIB with angioembolization 4/13 SBO with bowel perforation - I recommend exploratory laparotomy and bowel resection urgently tonight. I discussed the procedure, risks, and benefits with her and her husband. I  discussed the possibility of leaving her abdomen open with further surgery needed if there is bowel ischemia. They agree.  IV Cipro and Flagyl. On prednisone 40mg  BID - this imparts greater surgical risks due to immune suppression and wound healing issues Zenovia Jarred 09/01/2020, 6:37 PM

## 2020-09-01 NOTE — Progress Notes (Signed)
Patient clinically improved and willing to eat. Repeat Hb stable. Inconsistent historian. CT result shows free air and fluid, probably left colon perf post procedure . Self contained ? I called and discussed case with Dr Grandville Silos from general surgery . Will update patient's husband.currently hemodynamically stable.

## 2020-09-01 NOTE — Progress Notes (Signed)
Upon assessment, Patient had a small bloody stool. Heart rate is in 110's and BP is stable. On call triad MD made aware. H/H was ordered for now. Will continue to monitor.

## 2020-09-02 ENCOUNTER — Encounter (HOSPITAL_COMMUNITY): Payer: Self-pay | Admitting: General Surgery

## 2020-09-02 DIAGNOSIS — K625 Hemorrhage of anus and rectum: Secondary | ICD-10-CM | POA: Diagnosis not present

## 2020-09-02 LAB — TYPE AND SCREEN
ABO/RH(D): B POS
Antibody Screen: NEGATIVE
Unit division: 0
Unit division: 0
Unit division: 0
Unit division: 0
Unit division: 0
Unit division: 0

## 2020-09-02 LAB — BPAM RBC
Blood Product Expiration Date: 202204192359
Blood Product Expiration Date: 202204202359
Blood Product Expiration Date: 202205052359
Blood Product Expiration Date: 202205082359
Blood Product Expiration Date: 202205102359
Blood Product Expiration Date: 202205112359
ISSUE DATE / TIME: 202204132048
ISSUE DATE / TIME: 202204132048
ISSUE DATE / TIME: 202204160016
ISSUE DATE / TIME: 202204160409
Unit Type and Rh: 5100
Unit Type and Rh: 5100
Unit Type and Rh: 7300
Unit Type and Rh: 7300
Unit Type and Rh: 7300
Unit Type and Rh: 7300

## 2020-09-02 LAB — CBC WITH DIFFERENTIAL/PLATELET
Abs Immature Granulocytes: 0.16 10*3/uL — ABNORMAL HIGH (ref 0.00–0.07)
Basophils Absolute: 0.1 10*3/uL (ref 0.0–0.1)
Basophils Relative: 1 %
Eosinophils Absolute: 0.1 10*3/uL (ref 0.0–0.5)
Eosinophils Relative: 1 %
HCT: 37.9 % (ref 36.0–46.0)
Hemoglobin: 12.5 g/dL (ref 12.0–15.0)
Immature Granulocytes: 2 %
Lymphocytes Relative: 4 %
Lymphs Abs: 0.3 10*3/uL — ABNORMAL LOW (ref 0.7–4.0)
MCH: 30.6 pg (ref 26.0–34.0)
MCHC: 33 g/dL (ref 30.0–36.0)
MCV: 92.9 fL (ref 80.0–100.0)
Monocytes Absolute: 0.2 10*3/uL (ref 0.1–1.0)
Monocytes Relative: 3 %
Neutro Abs: 6.3 10*3/uL (ref 1.7–7.7)
Neutrophils Relative %: 89 %
Platelets: 114 10*3/uL — ABNORMAL LOW (ref 150–400)
RBC: 4.08 MIL/uL (ref 3.87–5.11)
RDW: 18.4 % — ABNORMAL HIGH (ref 11.5–15.5)
WBC Morphology: INCREASED
WBC: 7 10*3/uL (ref 4.0–10.5)
nRBC: 2.7 % — ABNORMAL HIGH (ref 0.0–0.2)

## 2020-09-02 LAB — BASIC METABOLIC PANEL
Anion gap: 12 (ref 5–15)
BUN: 15 mg/dL (ref 6–20)
CO2: 25 mmol/L (ref 22–32)
Calcium: 8.3 mg/dL — ABNORMAL LOW (ref 8.9–10.3)
Chloride: 91 mmol/L — ABNORMAL LOW (ref 98–111)
Creatinine, Ser: 0.7 mg/dL (ref 0.44–1.00)
GFR, Estimated: >60 mL/min (ref >60)
Glucose, Bld: 207 mg/dL — ABNORMAL HIGH (ref 70–99)
Potassium: 3.8 mmol/L (ref 3.5–5.1)
Sodium: 128 mmol/L — ABNORMAL LOW (ref 135–145)

## 2020-09-02 LAB — GLUCOSE, CAPILLARY
Glucose-Capillary: 154 mg/dL — ABNORMAL HIGH (ref 70–99)
Glucose-Capillary: 169 mg/dL — ABNORMAL HIGH (ref 70–99)
Glucose-Capillary: 234 mg/dL — ABNORMAL HIGH (ref 70–99)
Glucose-Capillary: 239 mg/dL — ABNORMAL HIGH (ref 70–99)
Glucose-Capillary: 254 mg/dL — ABNORMAL HIGH (ref 70–99)

## 2020-09-02 MED ORDER — HYDROMORPHONE HCL 1 MG/ML IJ SOLN
INTRAMUSCULAR | Status: AC
  Filled 2020-09-02: qty 1

## 2020-09-02 MED ORDER — 0.9 % SODIUM CHLORIDE (POUR BTL) OPTIME
TOPICAL | Status: DC | PRN
  Administered 2020-09-02: 6000 mL

## 2020-09-02 MED ORDER — HYDROMORPHONE HCL 1 MG/ML IJ SOLN
0.5000 mg | INTRAMUSCULAR | Status: DC | PRN
  Administered 2020-09-02 – 2020-09-03 (×5): 1 mg via INTRAVENOUS
  Administered 2020-09-03 (×2): 0.5 mg via INTRAVENOUS
  Administered 2020-09-08 (×2): 1 mg via INTRAVENOUS
  Administered 2020-09-09 – 2020-09-10 (×4): 0.5 mg via INTRAVENOUS
  Administered 2020-09-10 – 2020-09-13 (×9): 1 mg via INTRAVENOUS
  Filled 2020-09-02 (×9): qty 1
  Filled 2020-09-02 (×2): qty 0.5
  Filled 2020-09-02 (×6): qty 1
  Filled 2020-09-02: qty 0.5
  Filled 2020-09-02: qty 1
  Filled 2020-09-02: qty 0.5
  Filled 2020-09-02: qty 1
  Filled 2020-09-02: qty 0.5

## 2020-09-02 MED ORDER — FENTANYL CITRATE (PF) 100 MCG/2ML IJ SOLN
INTRAMUSCULAR | Status: AC
  Administered 2020-09-02: 50 ug
  Filled 2020-09-02: qty 2

## 2020-09-02 MED ORDER — FENTANYL CITRATE (PF) 100 MCG/2ML IJ SOLN
25.0000 ug | Freq: Once | INTRAMUSCULAR | Status: AC
  Administered 2020-09-02: 25 ug via INTRAVENOUS

## 2020-09-02 MED ORDER — SUGAMMADEX SODIUM 200 MG/2ML IV SOLN
INTRAVENOUS | Status: DC | PRN
  Administered 2020-09-02: 200 mg via INTRAVENOUS

## 2020-09-02 MED ORDER — METOPROLOL TARTRATE 5 MG/5ML IV SOLN
5.0000 mg | Freq: Four times a day (QID) | INTRAVENOUS | Status: DC | PRN
  Administered 2020-09-08: 5 mg via INTRAVENOUS
  Filled 2020-09-02 (×2): qty 5

## 2020-09-02 MED ORDER — HYDROMORPHONE HCL 1 MG/ML IJ SOLN
0.5000 mg | INTRAMUSCULAR | Status: DC | PRN
Start: 2020-09-02 — End: 2020-09-02
  Administered 2020-09-02: 0.5 mg via INTRAVENOUS
  Filled 2020-09-02: qty 0.5

## 2020-09-02 MED ORDER — HYDROMORPHONE HCL 1 MG/ML IJ SOLN
0.2500 mg | INTRAMUSCULAR | Status: DC | PRN
  Administered 2020-09-02: 0.5 mg via INTRAVENOUS
  Administered 2020-09-02 (×2): 0.25 mg via INTRAVENOUS

## 2020-09-02 MED ORDER — ESMOLOL HCL 100 MG/10ML IV SOLN
INTRAVENOUS | Status: DC | PRN
  Administered 2020-09-02: 30 mg via INTRAVENOUS

## 2020-09-02 MED ORDER — METHYLPREDNISOLONE SODIUM SUCC 40 MG IJ SOLR
40.0000 mg | Freq: Two times a day (BID) | INTRAMUSCULAR | Status: DC
  Administered 2020-09-02 – 2020-09-04 (×5): 40 mg via INTRAVENOUS
  Filled 2020-09-02 (×5): qty 1

## 2020-09-02 NOTE — Op Note (Signed)
09/01/2020 - 09/02/2020  12:26 AM  PATIENT:  Rhonda [name]  60 y.o. female  PRE-OPERATIVE DIAGNOSIS:  perforated bowel  POST-OPERATIVE DIAGNOSIS:  Necrotic perforated proximal sigmoid colon, Upon entering the abdomen (organ space), I encountered purulence in the abdomen.  CASE DATA:  Type of patient?: DOW CASE (Surgical Hospitalist [facility])  Status of Case? EMERGENT Add On  Infection Present At Time Of Surgery (PATOS)?  FECULENT PERITONITIS    PROCEDURE:  Procedure(s): EXPLORATORY LAPAROTOMY PARTIAL COLECTOMY (SIGMOID) COLOSTOMY HARTMAN'S APPLICATION WOUND VAC  SURGEON:  Surgeon(s): Georganna Skeans, MD  ASSISTANTS: none   ANESTHESIA:   general  EBL:  Total I/O In: 1500 [I.V.:1000; IV Piggyback:500] Out: 1075 [Urine:925; Blood:150]  BLOOD ADMINISTERED:none  DRAINS: none   SPECIMEN:  Excision  DISPOSITION OF SPECIMEN:  PATHOLOGY  COUNTS:  YES  DICTATION: .Dragon Dictation Findings: Necrotic section of proximal sigmoid colon with perforation and feculent peritonitis  Procedure in detail: Informed consent was obtained.  She received intravenous antibiotics.  She was brought to the operating room and general endotracheal anesthesia was administered by the anesthesia staff.  Foley catheter was placed by nursing.  Her abdomen was prepped and draped in a sterile fashion.  We did a timeout procedure.  I made a midline incision.  Subcutaneous tissues were dissected down revealing the anterior fascia.  This was divided sharply along the midline and the peritoneal cavity was carefully entered.  I encountered a large amount of free intraperitoneal air.  There was murky peritoneal fluid which was sent for cultures.  Her small bowel was then run from the ligament of Treitz to the terminal ileum.  It was very dilated, especially proximally.  There was 1 area of spontaneous serosal tear present on exploration due to the dilatation.  This was repaired with 3-0 silk sutures.   There were no perforations in the small bowel.  Further exploration revealed inflammation in the left lower quadrant.  I found a necrotic section of proximal sigmoid colon that was actively expelling air.  This was mobilized from the lateral peritoneal attachments.  I divided it proximal to the necrotic perforation using GIA 75 stapler.  I then divided it several centimeters distal to this area where the colon was viable and more normal in appearance again with GIA-75 stapler.  The mesentery was taken down with the LigaSure achieving excellent hemostasis.  The specimen was marked and sent to pathology.  I then further mobilized the descending colon from lateral peritoneal attachments in order to form a colostomy.  I made a circular incision in the left abdomen.  Subcutaneous tissues were dissected down and I cored out the fat.  A cruciate incision was made in the fascia and I made a hole through there for the colostomy.  It was brought out and passed easily.  The abdomen was then copiously irrigated with multiple liters of warm saline.  The left lower quadrant was checked closely for hemostasis of the mesentery and this looked good.  The remainder of the small bowel was returned to anatomic position.  I changed my gloves.  The omentum was brought over the bowel and the fascia was closed with running #1 looped PDS tied in the middle.  I matured the colostomy with interrupted 3-0 Vicryl's.  I then placed a medium wound VAC along the midline wound covered with VAC drape and hooked it up to the VAC suction with excellent seal.  All counts were correct.  She tolerated the procedure without apparent complication and  was taken recovery in stable condition. PATIENT DISPOSITION:  PACU - hemodynamically stable.   Delay start of Pharmacological VTE agent (>24hrs) due to surgical blood loss or risk of bleeding:  no  Georganna Skeans, MD, MPH, FACS Pager: [phone]  4/17/202212:26 AM

## 2020-09-02 NOTE — Consult Note (Signed)
[facility] Nurse ostomy consult note [facility] nurse consult received for emergently created stoma and NPWT placement earlier today.  [facility] Nurse will see on Monday, 4/18.  [facility] nursing team will follow, and will remain available to this patient, the nursing, surgical and medical teams.   Thank you, Maudie Flakes, MSN, RN, Chancy Milroy, Arther Abbott  Pager# [phone]

## 2020-09-02 NOTE — Transfer of Care (Signed)
Immediate Anesthesia Transfer of Care Note  Patient: Rhonda [name]  Procedure(s) Performed: EXPLORATORY LAPAROTOMY (N/A ) SMALL BOWEL RESECTION (N/A )  Patient Location: PACU  Anesthesia Type:General  Level of Consciousness: awake, alert  and oriented  Airway & Oxygen Therapy: Patient Spontanous Breathing and Patient connected to nasal cannula oxygen  Post-op Assessment: Report given to RN and Post -op Vital signs reviewed and stable  Post vital signs: Reviewed and stable  Last Vitals:  Vitals Value Taken Time  BP 127/85 09/02/20 0039  Temp    Pulse 104 09/02/20 0045  Resp 16 09/02/20 0045  SpO2 100 % 09/02/20 0045  Vitals shown include unvalidated device data.  Last Pain:  Vitals:   09/01/20 2112  TempSrc:   PainSc: Asleep         Complications: No complications documented.

## 2020-09-02 NOTE — Progress Notes (Signed)
Pt transported to Short stay room 36.

## 2020-09-02 NOTE — Progress Notes (Signed)
PROGRESS NOTE    Rhonda [name]  TOI:[id number] DOB: Feb 07, 1961 DOA: 08/29/2020 PCP: [facility].    Brief Narrative: 60 year old female with history of hypertension, obesity, obstructive sleep apnea, not using CPAP, neurosarcoidosis on chronic prednisone therapy, wheelchair-bound due to chronic debility, recurrent GI bleeding with recent multiple hospitalizations presented back to the emergency room with frank rectal bleeding and hypotension.   Significant events: 3/8, admission with rectal bleeding, EGD and colonoscopy negative.  Capsule endoscopy with erythema in the small bowel. 4/4-4/6 admitted with hematochezia, symptomatically treated, capsule endoscopy and discharged home. 4/13 presented with hematochezia, CT angiogram with acute bleeding in the descending colon status post coil embolization of the distal [place] branch of IMA.  Insulin infusion per blood sugars.  Admitted to ICU. 4/15, transferred out of ICU.  4/16, afternoon after eating lunch, patient had severe abdominal pain.  Relieved with pain medication.  A CT scan showed visceral perforation.  Underwent urgent exploratory laparotomy and found to have necrotic sigmoid colon.  Status post Jeanette Caprice procedure with colostomy and wound VAC for midline incision.   Assessment & Plan:   Active Problems:   GI bleed   Hypotension   Rectal bleeding   AKI (acute kidney injury) ([place])  Perforated sigmoid colon with history of diverticulosis and multiple procedures.  IMA embolization 4/13. Emergency exploratory laparotomy and Hartman's procedure 4/16. Postop management as per surgery. Chest physiotherapy and incentive spirometry, adequate pain management, increased dose of Dilaudid today as patient has no adequate pain management. She is wheelchair-bound, will explore whether patient can get out of the bed. NG tube to low intermittent suction.  NPO.  Colostomy care. Remains on ciprofloxacin and Flagyl due to feculent  peritonitis.  Acute lower GI bleeding, diverticular bleeding.  Hemorrhagic shock secondary to acute GI bleeding and anemia of blood loss: Known diverticular disease. status post coil embolization 4/13.  Hemoglobin is stable since then.  Hopefully her disease part of the sigmoid colon is removed with gangrene and will stop bleeding further.  Recheck levels tomorrow.  Acute renal failure: Due to #1.  Improved and normalized.  Recheck levels tomorrow.  Type 2 diabetes with hyperglycemia: N.p.o. now.  On IV fluids.  We will keep on half dose of long-acting insulin and sliding scale insulin.  Neurosarcoidosis/chronic debility and wheelchair-bound status: Patient on prednisone 40 mg twice a day that she will continue.  Also on gabapentin. Changed to IV Solu-Medrol until patient can take by mouth to prevent from adrenal insufficiency.  Obstructive sleep apnea: Declined CPAP.  Not using.  Hypertension: On metoprolol.  On hold due to hypotension.  IV metoprolol as she is not able to take by mouth.  Hypochloremic hyponatremia: Normalizing.  DVT prophylaxis: SCDs Start: 08/29/20 2256   Code Status: Full code Family Communication: Husband on the phone.  Called and updated. Disposition Plan: Status is: Inpatient  Remains inpatient appropriate because:Inpatient level of care appropriate due to severity of illness   Dispo: The patient is from: Home              Anticipated d/c is to: Home              Patient currently is not medically stable to d/c.   Difficult to place patient no         Consultants:   PCCM  Gastroenterology  Interventional radiology  Procedures:   Embolization inferior mesenteric artery 4/13  Hartman's procedure 4/16.  Antimicrobials:  Antibiotics Given (last 72 hours)  Date/Time Action Medication Dose Rate   09/01/20 2041 New Bag/Given   metroNIDAZOLE (FLAGYL) IVPB 500 mg 500 mg 100 mL/hr   09/01/20 2207 New Bag/Given   ciprofloxacin (CIPRO) IVPB  400 mg 400 mg 200 mL/hr   09/02/20 0517 New Bag/Given  [pt in PACU]   metroNIDAZOLE (FLAGYL) IVPB 500 mg 500 mg 100 mL/hr   09/02/20 0748 New Bag/Given   ciprofloxacin (CIPRO) IVPB 400 mg 400 mg 200 mL/hr   09/02/20 1113 New Bag/Given   metroNIDAZOLE (FLAGYL) IVPB 500 mg 500 mg 100 mL/hr        Subjective: Patient seen and examined.  Overnight events noted.  Went to surgery and came back to the floor. Complains of pain 10 out of 10 along the incision line.  Did denies any difficulty with breathing.  Objective: Vitals:   09/02/20 0600 09/02/20 0800 09/02/20 0815 09/02/20 1000  BP: 115/77 106/71 101/84 119/87  Pulse: (!) 127 (!) 124 (!) 123 (!) 118  Resp: 19 14 17    Temp: 98.3 F (36.8 C)  97.9 F (36.6 C)   TempSrc: Oral  Axillary   SpO2:  94% 92% 98%  Weight:      Height:        Intake/Output Summary (Last 24 hours) at 09/02/2020 1143 Last data filed at 09/02/2020 1113 Gross per 24 hour  Intake 3637.16 ml  Output 1075 ml  Net 2562.16 ml   Filed Weights   08/30/20 0022 08/30/20 0500 09/01/20 0448  Weight: 79.5 kg 79.5 kg 83.1 kg    Examination: General: Chronically sick looking, in no acute moderate distress due to pain. Cardiovascular: S1-S2 normal.  Tachycardic. Respiratory: Bilateral clear.  Not taking deep breaths. Gastrointestinal: Pendulous.  Tender all over as anticipated from surgery. Patient has left lower quadrant colostomy bag with no air or stool on it. Midline wound VAC. NG tube with no output. Ext: No swelling or edema.  No cyanosis.    Data Reviewed: I have personally reviewed following labs and imaging studies  CBC: Recent Labs  Lab 08/29/20 2003 08/30/20 0118 08/31/20 0048 08/31/20 1614 08/31/20 2209 09/01/20 0920 09/01/20 1422 09/02/20 0631  WBC 9.0   < > 16.7* 13.9*  --  13.2* 17.1* 7.0  NEUTROABS 7.6  --   --  12.3*  --  11.7* 15.1* 6.3  HGB 8.1*   < > 7.9* 7.5* 7.0* 10.3*  10.4* 11.8* 12.5  HCT 26.3*   < > 23.7* 23.5* 21.8*  30.8*  30.8* 34.7* 37.9  MCV 101.5*   < > 94.8 95.5  --  91.1 90.1 92.9  PLT 216   < > 144* 124*  --  110* 122* 114*   < > = values in this interval not displayed.   Basic Metabolic Panel: Recent Labs  Lab 08/30/20 0600 08/31/20 0048 08/31/20 1614 09/01/20 0920 09/01/20 1422 09/02/20 0631  NA 136 124* 124*  --  125* 128*  K 4.1 4.9 4.3  --  3.8 3.8  CL 99 89* 91*  --  86* 91*  CO2 31 30 26   --  26 25  GLUCOSE 146* 280* 223*  --  173* 207*  BUN 11 9 11   --  14 15  CREATININE 0.82 0.62 0.73  --  0.59 0.70  CALCIUM 8.4* 8.3* 7.8*  --  8.5* 8.3*  MG 1.7 2.0  --  1.8  --   --   PHOS 2.1* 4.0  --  3.8  --   --  GFR: Estimated Creatinine Clearance: 77.3 mL/min (by C-G formula based on SCr of 0.7 mg/dL). Liver Function Tests: Recent Labs  Lab 08/29/20 2003  AST 37  ALT <5  ALKPHOS 137*  BILITOT 0.7  PROT 4.5*  ALBUMIN 2.6*   No results for input(s): LIPASE, AMYLASE in the last 168 hours. No results for input(s): AMMONIA in the last 168 hours. Coagulation Profile: Recent Labs  Lab 08/29/20 2003  INR 1.1   Cardiac Enzymes: No results for input(s): CKTOTAL, CKMB, CKMBINDEX, TROPONINI in the last 168 hours. BNP (last 3 results) No results for input(s): PROBNP in the last 8760 hours. HbA1C: No results for input(s): HGBA1C in the last 72 hours. CBG: Recent Labs  Lab 09/01/20 0712 09/01/20 1109 09/01/20 1629 09/01/20 2107 09/02/20 0813  GLUCAP 197* 245* 208* 160* 234*   Lipid Profile: No results for input(s): CHOL, HDL, LDLCALC, TRIG, CHOLHDL, LDLDIRECT in the last 72 hours. Thyroid Function Tests: No results for input(s): TSH, T4TOTAL, FREET4, T3FREE, THYROIDAB in the last 72 hours. Anemia Panel: No results for input(s): VITAMINB12, FOLATE, FERRITIN, TIBC, IRON, RETICCTPCT in the last 72 hours. Sepsis Labs: No results for input(s): PROCALCITON, LATICACIDVEN in the last 168 hours.  Recent Results (from the past 240 hour(s))  Resp Panel by RT-PCR (Flu A&B,  Covid) Nasopharyngeal Swab     Status: None   Collection Time: 08/29/20  8:30 PM   Specimen: Nasopharyngeal Swab; Nasopharyngeal(NP) swabs in vial transport medium  Result Value Ref Range Status   SARS Coronavirus 2 by RT PCR NEGATIVE NEGATIVE Final    Comment: (NOTE) SARS-CoV-2 target nucleic acids are NOT DETECTED.  The SARS-CoV-2 RNA is generally detectable in upper respiratory specimens during the acute phase of infection. The lowest concentration of SARS-CoV-2 viral copies this assay can detect is 138 copies/mL. A negative result does not preclude SARS-Cov-2 infection and should not be used as the sole basis for treatment or other patient management decisions. A negative result may occur with  improper specimen collection/handling, submission of specimen other than nasopharyngeal swab, presence of viral mutation(s) within the areas targeted by this assay, and inadequate number of viral copies(<138 copies/mL). A negative result must be combined with clinical observations, patient history, and epidemiological information. The expected result is Negative.  Fact Sheet for Patients:  EntrepreneurPulse.com.au  Fact Sheet for Healthcare Providers:  IncredibleEmployment.be  This test is no t yet approved or cleared by the [place] FDA and  has been authorized for detection and/or diagnosis of SARS-CoV-2 by FDA under an Emergency Use Authorization (EUA). This EUA will remain  in effect (meaning this test can be used) for the duration of the COVID-19 declaration under Section 564(b)(1) of the Act, 21 U.S.C.section 360bbb-3(b)(1), unless the authorization is terminated  or revoked sooner.       Influenza A by PCR NEGATIVE NEGATIVE Final   Influenza B by PCR NEGATIVE NEGATIVE Final    Comment: (NOTE) The Xpert Xpress SARS-CoV-2/FLU/RSV plus assay is intended as an aid in the diagnosis of influenza from Nasopharyngeal swab specimens and should  not be used as a sole basis for treatment. Nasal washings and aspirates are unacceptable for Xpert Xpress SARS-CoV-2/FLU/RSV testing.  Fact Sheet for Patients: EntrepreneurPulse.com.au  Fact Sheet for Healthcare Providers: IncredibleEmployment.be  This test is not yet approved or cleared by the [place] FDA and has been authorized for detection and/or diagnosis of SARS-CoV-2 by FDA under an Emergency Use Authorization (EUA). This EUA will remain in effect (meaning this test can  be used) for the duration of the COVID-19 declaration under Section 564(b)(1) of the Act, 21 U.S.C. section 360bbb-3(b)(1), unless the authorization is terminated or revoked.  Performed at [facility], [place] [address], [place], [place] [address]   MRSA PCR Screening     Status: Abnormal   Collection Time: 08/30/20 12:41 AM   Specimen: Nasopharyngeal  Result Value Ref Range Status   MRSA by PCR POSITIVE (A) NEGATIVE Final    Comment:        The GeneXpert MRSA Assay (FDA approved for NASAL specimens only), is one component of a comprehensive MRSA colonization surveillance program. It is not intended to diagnose MRSA infection nor to guide or monitor treatment for MRSA infections. RESULT CALLED TO, READ BACK BY AND VERIFIED WITH: Almyra Free RN 08/30/20 0503 JDW Performed at [facility], [address], [place], [place] [address]   Aerobic/Anaerobic Culture w Gram Stain (surgical/deep wound)     Status: None (Preliminary result)   Collection Time: 09/01/20 11:22 PM   Specimen: PATH Cytology Peritoneal fluid; Body Fluid  Result Value Ref Range Status   Specimen Description PERITONEAL FLUID  Final   Special Requests IN SWABS PATIENT ON FOLLOWING CIPRO,FLAGYL  Final   Gram Stain   Final    RARE WBC PRESENT, PREDOMINANTLY PMN NO ORGANISMS SEEN Performed at [facility], [address], [place],  [address]    Culture PENDING  Incomplete    Report Status PENDING  Incomplete         Radiology Studies: CT ABDOMEN PELVIS W CONTRAST  Result Date: 09/01/2020 CLINICAL DATA:  Abdominal pain and distension, history of recent GI bleed with embolization EXAM: CT ABDOMEN AND PELVIS WITH CONTRAST TECHNIQUE: Multidetector CT imaging of the abdomen and pelvis was performed using the standard protocol following bolus administration of intravenous contrast. CONTRAST:  125mL OMNIPAQUE IOHEXOL 350 MG/ML SOLN COMPARISON:  08/29/2020 FINDINGS: Lower chest: Mild bibasilar atelectasis is noted. 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 Hepatobiliary: The liver is diffusely fatty infiltrated. No focal mass lesion is seen. The gallbladder is within normal limits. Pancreas: Pancreas is unremarkable. Spleen: Normal in size without focal abnormality. Adrenals/Urinary Tract: Adrenal glands are within normal limits. Normal enhancement of the kidneys is seen. Normal excretion is noted bilaterally. Small cyst is noted in the left kidney stable from the previous exam. No obstructive changes are seen. The bladder is well distended. Stomach/Bowel: Diverticular changes noted within the colon. Changes of prior embolization are seen. The colon is predominately decompressed. The appendix is within normal limits. Considerable free air is noted within the anterior aspect of the abdomen. The stomach is well distended with ingested food stuffs. Considerable dilatation of the jejunum is seen there is a caliber change noted in the mid abdomen best seen on image number 65 of series 3. The more distal small bowel appears within normal limits. Terminal ileum is unremarkable. Vascular/Lymphatic: Atherosclerotic calcifications of the aorta are noted. No aneurysmal dilatation is seen. Changes of prior IMA embolization are noted consistent with the given clinical history. The proximal IMA is within normal limits. SMA appears within normal limits as well.  No significant lymphadenopathy is noted. Reproductive: Status post hysterectomy. No adnexal masses. Other: Mild free fluid is noted within the pelvis. This is likely related to the underlying perforation. Musculoskeletal: Degenerative changes of the lumbar spine are noted. IMPRESSION: Interval development of partial small bowel obstruction involving the distal jejunum  with evidence of perforation with free air and free fluid within the abdomen. These changes are new from the prior CT. A transition zone is noted in the mid abdomen although no definitive mass lesion is seen. Changes of prior embolization of the IMA branches. No findings to suggest active extravasation are seen. These results will be called to the ordering clinician or representative by the Radiologist Assistant, and communication documented in the PACS or [employer]. Electronically Signed   By: Inez Catalina M.D.   On: 09/01/2020 17:37        Scheduled Meds: . Chlorhexidine Gluconate Cloth  6 each Topical Daily  . gabapentin  100 mg Oral TID  . HYDROmorphone      . insulin aspart  0-15 Units Subcutaneous Q4H  . insulin glargine  8 Units Subcutaneous QHS  . lidocaine  1 patch Transdermal Q24H  . mouth rinse  15 mL Mouth Rinse BID  . methylPREDNISolone (SOLU-MEDROL) injection  40 mg Intravenous Q12H  . metoprolol tartrate  25 mg Oral BID  . mupirocin ointment  1 application Nasal BID  . pantoprazole (PROTONIX) IV  40 mg Intravenous Q24H   Continuous Infusions: . sodium chloride 125 mL/hr at 09/02/20 0504  . ciprofloxacin 400 mg (09/02/20 0748)  . metronidazole 500 mg (09/02/20 1113)     LOS: 4 days    Time spent: 35 minutes    Barb Merino, MD [facility] Pager [phone]

## 2020-09-02 NOTE — Anesthesia Postprocedure Evaluation (Signed)
Anesthesia Post Note  Patient: Rhonda [name]  Procedure(s) Performed: EXPLORATORY LAPAROTOMY (N/A ) SMALL BOWEL RESECTION (N/A )     Patient location during evaluation: PACU Anesthesia Type: General Level of consciousness: awake and alert Pain management: pain level controlled Vital Signs Assessment: post-procedure vital signs reviewed and stable Respiratory status: spontaneous breathing, nonlabored ventilation, respiratory function stable and patient connected to nasal cannula oxygen Cardiovascular status: blood pressure returned to baseline and stable Postop Assessment: no apparent nausea or vomiting Anesthetic complications: no   No complications documented.  Last Vitals:  Vitals:   09/02/20 0152 09/02/20 0207  BP: 120/79 118/79  Pulse: (!) 114 (!) 117  Resp: (!) 27 (!) 26  Temp:    SpO2: 100% 100%    Last Pain:  Vitals:   09/02/20 0145  TempSrc:   PainSc: 6                  Makih Stefanko,W. EDMOND

## 2020-09-02 NOTE — Progress Notes (Signed)
Progress Note: General Surgery Service   Chief Complaint/Subjective: Complaining of pain around incisions this morning.  No flatus or bowel movements.  No nausea or vomiting with NG in place  Objective: Vital signs in last 24 hours: Temp:  [97.9 F (36.6 C)-99 F (37.2 C)] 97.9 F (36.6 C) (04/17 0815) Pulse Rate:  [85-127] 123 (04/17 0815) Resp:  [12-30] 17 (04/17 0815) BP: (101-150)/(77-97) 101/84 (04/17 0815) SpO2:  [76 %-100 %] 92 % (04/17 0815) Last BM Date: 09/01/20  Intake/Output from previous day: 04/16 0701 - 04/17 0700 In: 3618.6 [P.O.:440; I.V.:2064.2; Blood:455; IV Piggyback:659.4] Out: 1375 [Urine:1225; Blood:150] Intake/Output this shift: No intake/output data recorded.   GI: Abd midline incision with vac holding suction, ostomy viable with no gas or stool in bag, NG in place   Lab Results: CBC  Recent Labs    09/01/20 1422 09/02/20 0631  WBC 17.1* 7.0  HGB 11.8* 12.5  HCT 34.7* 37.9  PLT 122* 114*   BMET Recent Labs    09/01/20 1422 09/02/20 0631  NA 125* 128*  K 3.8 3.8  CL 86* 91*  CO2 26 25  GLUCOSE 173* 207*  BUN 14 15  CREATININE 0.59 0.70  CALCIUM 8.5* 8.3*   PT/INR No results for input(s): LABPROT, INR in the last 72 hours. ABG No results for input(s): PHART, HCO3 in the last 72 hours.  Invalid input(s): PCO2, PO2  Anti-infectives: Anti-infectives (From admission, onward)   Start     Dose/Rate Route Frequency Ordered Stop   09/01/20 1930  ciprofloxacin (CIPRO) IVPB 400 mg        400 mg 200 mL/hr over 60 Minutes Intravenous Every 12 hours 09/01/20 1836     09/01/20 1930  metroNIDAZOLE (FLAGYL) IVPB 500 mg        500 mg 100 mL/hr over 60 Minutes Intravenous Every 8 hours 09/01/20 1836        Medications: Scheduled Meds: . Chlorhexidine Gluconate Cloth  6 each Topical Daily  . gabapentin  100 mg Oral TID  . HYDROmorphone      . insulin aspart  0-15 Units Subcutaneous Q4H  . insulin glargine  8 Units Subcutaneous QHS  .  lidocaine  1 patch Transdermal Q24H  . mouth rinse  15 mL Mouth Rinse BID  . methylPREDNISolone (SOLU-MEDROL) injection  40 mg Intravenous Q12H  . metoprolol tartrate  25 mg Oral BID  . mupirocin ointment  1 application Nasal BID  . pantoprazole (PROTONIX) IV  40 mg Intravenous Q24H   Continuous Infusions: . sodium chloride 125 mL/hr at 09/02/20 0504  . ciprofloxacin 400 mg (09/02/20 0748)  . metronidazole 500 mg (09/02/20 0517)   PRN Meds:.HYDROmorphone (DILAUDID) injection, metoprolol tartrate, naphazoline-glycerin, ondansetron (ZOFRAN) IV, oxyCODONE, polyethylene glycol  Assessment/Plan: Rhonda [name] is a 60 year old female with a history of GI bleed s/p angioembolization of a branch of the IMA on 4/13, who developed a necrotic section of proximal sigmoid colon with perforation and feculent peritonitis s/p Hartman's procedure on 09/01/2020.  Perforated sigmoid colon s/p sx as above - NPO - NG LIWS - pain and nausea control - WOC consult to assist with vac and ostomy care - Encourage activity - Continue Cipro/flagyl for at least 4 days post op due to feculent peritonitis - HGB stable at 12.5   LOS: 4 days      Rhonda Morn, MD  [facility] Use [email or web address] to contact on call provider

## 2020-09-02 NOTE — Progress Notes (Signed)
Pt returned to floor from PACU.

## 2020-09-03 DIAGNOSIS — K625 Hemorrhage of anus and rectum: Secondary | ICD-10-CM | POA: Diagnosis not present

## 2020-09-03 DIAGNOSIS — E861 Hypovolemia: Secondary | ICD-10-CM | POA: Diagnosis not present

## 2020-09-03 DIAGNOSIS — I9589 Other hypotension: Secondary | ICD-10-CM | POA: Diagnosis not present

## 2020-09-03 LAB — PHOSPHORUS: Phosphorus: 3.5 mg/dL (ref 2.5–4.6)

## 2020-09-03 LAB — CBC WITH DIFFERENTIAL/PLATELET
Abs Immature Granulocytes: 0.2 10*3/uL — ABNORMAL HIGH (ref 0.00–0.07)
Basophils Absolute: 0.1 10*3/uL (ref 0.0–0.1)
Basophils Relative: 1 %
Eosinophils Absolute: 0 10*3/uL (ref 0.0–0.5)
Eosinophils Relative: 0 %
HCT: 32 % — ABNORMAL LOW (ref 36.0–46.0)
Hemoglobin: 10.5 g/dL — ABNORMAL LOW (ref 12.0–15.0)
Immature Granulocytes: 2 %
Lymphocytes Relative: 3 %
Lymphs Abs: 0.3 10*3/uL — ABNORMAL LOW (ref 0.7–4.0)
MCH: 30.9 pg (ref 26.0–34.0)
MCHC: 32.8 g/dL (ref 30.0–36.0)
MCV: 94.1 fL (ref 80.0–100.0)
Monocytes Absolute: 0.4 10*3/uL (ref 0.1–1.0)
Monocytes Relative: 3 %
Neutro Abs: 10.1 10*3/uL — ABNORMAL HIGH (ref 1.7–7.7)
Neutrophils Relative %: 91 %
Platelets: 101 10*3/uL — ABNORMAL LOW (ref 150–400)
RBC: 3.4 MIL/uL — ABNORMAL LOW (ref 3.87–5.11)
RDW: 17.8 % — ABNORMAL HIGH (ref 11.5–15.5)
WBC: 11 10*3/uL — ABNORMAL HIGH (ref 4.0–10.5)
nRBC: 0.7 % — ABNORMAL HIGH (ref 0.0–0.2)

## 2020-09-03 LAB — GLUCOSE, CAPILLARY
Glucose-Capillary: 126 mg/dL — ABNORMAL HIGH (ref 70–99)
Glucose-Capillary: 130 mg/dL — ABNORMAL HIGH (ref 70–99)
Glucose-Capillary: 138 mg/dL — ABNORMAL HIGH (ref 70–99)
Glucose-Capillary: 161 mg/dL — ABNORMAL HIGH (ref 70–99)
Glucose-Capillary: 165 mg/dL — ABNORMAL HIGH (ref 70–99)
Glucose-Capillary: 184 mg/dL — ABNORMAL HIGH (ref 70–99)

## 2020-09-03 LAB — BASIC METABOLIC PANEL
Anion gap: 7 (ref 5–15)
BUN: 16 mg/dL (ref 6–20)
CO2: 26 mmol/L (ref 22–32)
Calcium: 8 mg/dL — ABNORMAL LOW (ref 8.9–10.3)
Chloride: 94 mmol/L — ABNORMAL LOW (ref 98–111)
Creatinine, Ser: 0.66 mg/dL (ref 0.44–1.00)
GFR, Estimated: >60 mL/min (ref >60)
Glucose, Bld: 161 mg/dL — ABNORMAL HIGH (ref 70–99)
Potassium: 4.1 mmol/L (ref 3.5–5.1)
Sodium: 127 mmol/L — ABNORMAL LOW (ref 135–145)

## 2020-09-03 LAB — MAGNESIUM: Magnesium: 1.6 mg/dL — ABNORMAL LOW (ref 1.7–2.4)

## 2020-09-03 MED ORDER — MAGNESIUM SULFATE 2 GM/50ML IV SOLN
2.0000 g | Freq: Once | INTRAVENOUS | Status: AC
  Administered 2020-09-03: 2 g via INTRAVENOUS
  Filled 2020-09-03: qty 50

## 2020-09-03 NOTE — Evaluation (Signed)
Physical Therapy Evaluation Patient Details Name: Rhonda [name] MRN: [id number] DOB: 23-Apr-1961 Today's Date: 09/03/2020   History of Present Illness  Pt is 60 yo female presented to [facility] on 08/29/20 with rectal bleeding (several recent admissions for similar). Pt admitted with hemorrhagic shock.  She is s/p IR for coil embolization of intraluminal extravasation for a distal arcade branch of the IMA on 08/29/20.  She developed abdominal pain and found to have SBO with bowel perforation s/p emergent exp lap, partial colectomy, colostomy, Hartman's and wound vac application 3/47/42.  Medical hx inlcudes DM2, seizures, HTN, DDD, recurrent GIB.  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  Clinical Impression   Pt admitted with above diagnosis. Pt is largely non-ambulatory at baseline.  She could stand pivot to w/c with RW and supervision and was beginning to work on ambulation with PT and family.  Now s/p surgery requiring max x 2 for bed mobility and sitting EOB.  Unable to get standing or OOB due to pain, need for increased assistance, and weakness.  Pt's spouse present and very helpful in encouraging pt with transfers, cues for relaxation, and assisting therapist.  Pt is significantly below her baseline and will benefit from acute PT services and SNF at d/c to advance.  Also, recommend OT as pt was performing ADLs prior to admission and now limited. Pt currently with functional limitations due to the deficits listed below (see PT Problem List). Pt will benefit from skilled PT to increase their independence and safety with mobility to allow discharge to the venue listed below.       Follow Up Recommendations SNF    Equipment Recommendations  Other (comment) (hoyer and hospital but but needs assessment post acute)    Recommendations for Other Services       Precautions / Restrictions Precautions Precautions: Fall Precaution Comments: Abdomen incision  with wound vac      Mobility  Bed Mobility Overal bed mobility: Needs Assistance Bed Mobility: Rolling;Sidelying to Sit;Sit to Sidelying Rolling: Min assist Sidelying to sit: Max assist;+2 for physical assistance     Sit to sidelying: +2 for physical assistance;Max assist General bed mobility comments: Pt required HOB increased, cues for sequencing, use of bed rail and bed pad to facilitate , with increased time and encouragement.  Pt assisting with legs but requiring max x 2 to lift trunk and scoot forward.  Pt's spouse present and very encouraging and providing cues for movement and relaxation that helped pt.    Transfers                 General transfer comment: unable to attempt due to weakness/pain/unsafe  Ambulation/Gait                Stairs            Wheelchair Mobility    Modified Rankin (Stroke Patients Only)       Balance Overall balance assessment: Needs assistance Sitting-balance support: Bilateral upper extremity supported Sitting balance-[name]: Poor Sitting balance - Comments: Requiring min-mod A with posterior lean initially.  Pt only sitting EOB for 2-3 minutes due to abdominal pain.       Standing balance comment: unable                             Pertinent Vitals/Pain      Home Living Family/patient expects to be discharged to:: Private residence  Living Arrangements: Spouse/significant other Available Help at Discharge: Family;Available 24 hours/day Type of Home: House Home Access: Ramped entrance     Home Layout: Two level;1/2 bath on main level;Able to live on main level with bedroom/bathroom Home Equipment: Bedside commode;Walker - 2 wheels;Wheelchair - manual      Prior Function Level of Independence: Needs assistance   Gait / Transfers Assistance Needed: W/c for primary mobility; was working with therapy to get back to walking; typically could get to w/c on her own with RW  ADL's / Homemaking  Assistance Needed: pt performs majority of ADLs without assist (sponge bathing, needs assist to get in tub), but spouse helps as needed        Hand Dominance        Extremity/Trunk Assessment   Upper Extremity Assessment Upper Extremity Assessment: RUE deficits/detail;LUE deficits/detail RUE Deficits / Details: ROM WFL; Unable to MMT due to abdominal pain - does have generalized weakness LUE Deficits / Details: ROM WFL; Unable to MMT due to abdominal pain - does have generalized weakness    Lower Extremity Assessment Lower Extremity Assessment: LLE deficits/detail;RLE deficits/detail RLE Deficits / Details: ROM WFL; Unable to MMT due to abdominal pain - does have generalized weakness ; R LE appears weaker than L LE slightly LLE Deficits / Details: ROM WFL; Unable to MMT due to abdominal pain - does have generalized weakness    Cervical / Trunk Assessment Cervical / Trunk Assessment: Normal  Communication   Communication: No difficulties  Cognition Arousal/Alertness: Lethargic Behavior During Therapy: WFL for tasks assessed/performed Overall Cognitive Status: History of cognitive impairments - at baseline                                 General Comments: Spouse reports pt with declined short term memory baseline.  Seems near baseline except more lethargic      General Comments General comments (skin integrity, edema, etc.): Discussed PT role/POC with pt and family.  Discussed recommendation for SNF post acute    Exercises     Assessment/Plan    PT Assessment Patient needs continued PT services  PT Problem List Decreased strength;Decreased mobility;Decreased safety awareness;Decreased range of motion;Decreased knowledge of precautions;Decreased activity tolerance;Decreased balance;Decreased knowledge of use of DME;Pain       PT Treatment Interventions DME instruction;Therapeutic activities;Therapeutic exercise;Patient/family education;Balance  training;Functional mobility training    PT Goals (Current goals can be found in the Care Plan section)  Acute Rehab PT Goals Patient Stated Goal: agreeable to rehab PT Goal Formulation: With patient/family Time For Goal Achievement: 09/17/20 Potential to Achieve Goals: Good    Frequency Min 2X/week   Barriers to discharge        Co-evaluation               AM-PAC PT "6 Clicks" Mobility  Outcome Measure Help needed turning from your back to your side while in a flat bed without using bedrails?: A Lot (used rails) Help needed moving from lying on your back to sitting on the side of a flat bed without using bedrails?: Total Help needed moving to and from a bed to a chair (including a wheelchair)?: Total Help needed standing up from a chair using your arms (e.g., wheelchair or bedside chair)?: Total Help needed to walk in hospital room?: Total Help needed climbing 3-5 steps with a railing? : Total 6 Click Score: 7    End of Session  Activity Tolerance: Patient limited by pain;Patient limited by lethargy Patient left: in bed;with call bell/phone within reach;with bed alarm set;with family/visitor present (request break from SCD - encourage to perform ankle pumps) Nurse Communication: Mobility status;Need for lift equipment PT Visit Diagnosis: Other abnormalities of gait and mobility (R26.89);Muscle weakness (generalized) (M62.81);Pain Pain - part of body:  (abdomen and back)    Time: 0813-8871 PT Time Calculation (min) (ACUTE ONLY): 28 min   Charges:   PT Evaluation $PT Eval Moderate Complexity: 1 Mod PT Treatments $Therapeutic Activity: 8-22 mins        Abran Richard, PT Acute Rehab Services Pager [phone] [facility] [phone]    Karlton Lemon 09/03/2020, 1:57 PM

## 2020-09-03 NOTE — Progress Notes (Signed)
PROGRESS NOTE    Rhonda [name]  IRC:[id number] DOB: 1960/11/19 DOA: 08/29/2020 PCP: [place].    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   Significant events: 3/8, admission with rectal bleeding, EGD and colonoscopy negative.   4/4-4/6 admitted with hematochezia, symptomatically treated, capsule endoscopy and discharged home. 4/13 presented with hematochezia, CT angiogram with acute bleeding in the descending colon status post coil embolization of the distal Arcade branch of the superior sigmoidal artery. Insulin infusion for high blood sugars.  Admitted to ICU. 4/15, transferred out of ICU.  4/16, afternoon after eating lunch, patient had severe abdominal pain.  Relieved with pain medication.  A CT scan showed visceral perforation.  Underwent urgent exploratory laparotomy and found to have necrotic sigmoid colon.  Status post Jeanette Caprice procedure with colostomy and wound VAC for midline incision.   Assessment & Plan:   Active Problems:   GI bleed   Hypotension   Rectal bleeding   AKI (acute kidney injury) ([place])  Perforated sigmoid colon with history of diverticulosis and multiple procedures.  IMA embolization 4/13. Emergency exploratory laparotomy and Hartman's procedure 4/16. Postop management as per surgery. Chest physiotherapy and incentive spirometry, adequate pain management, using IV Dilaudid.  Not ready to use oral pain medications yet. She is wheelchair-bound, will consult PT OT to start mobilizing her. N.p.o. with ice chips. Peritoneal cultures negative so far.Remains on ciprofloxacin and Flagyl due to feculent peritonitis. NG tube fell off, patient with no  active nausea vomiting.  Will discuss with surgery before reinserting.  No return of bowel function yet.  Acute lower GI bleeding, diverticular bleeding.  Hemorrhagic shock secondary to acute GI bleeding and anemia of blood loss: Known diverticular disease. status post coil embolization 4/13.  Hemoglobin is stable since then.  Hopefully her disease part of the sigmoid colon is removed with gangrene and will stop bleeding further.    Acute renal failure: Due to #1.  Improved and normalized.  Hemoglobin 10.5.  Type 2 diabetes with hyperglycemia: N.p.o. now.  On IV fluids.  We will keep on half dose of long-acting insulin and sliding scale insulin.  Neurosarcoidosis/chronic debility and wheelchair-bound status: Patient on prednisone 40 mg twice a day that she will continue.  Also on gabapentin. Changed to IV Solu-Medrol until patient can take by mouth to prevent from adrenal insufficiency.  Obstructive sleep apnea: Declined CPAP.  Not using.  Hypertension: On metoprolol.  On hold due to hypotension.  IV metoprolol as she is not able to take by mouth.  Hypochloremic hyponatremia: Normalizing.  Hypomagnesemia: Replaced today.  Recheck tomorrow morning.  DVT prophylaxis: SCDs Start: 08/29/20 2256   Code Status: Full code Family Communication: None today.  Will meet with her husband. Disposition Plan: Status is: Inpatient  Remains inpatient appropriate because:Inpatient level of care appropriate due to severity of illness   Dispo: The patient is from: Home              Anticipated d/c is to: Home              Patient currently is not medically stable to d/c.   Difficult to place patient no         Consultants:   PCCM  Gastroenterology  Interventional radiology  Procedures:  Embolization inferior mesenteric artery 4/13  Hartman's procedure 4/16.  Antimicrobials:  Antibiotics Given (last 72 hours)    Date/Time Action Medication Dose Rate   09/01/20 2041 New  Bag/Given   metroNIDAZOLE (FLAGYL) IVPB 500 mg 500 mg 100 mL/hr   09/01/20 2207 New Bag/Given   ciprofloxacin (CIPRO) IVPB 400 mg 400 mg 200 mL/hr   09/02/20 0517 New Bag/Given  [pt in PACU]   metroNIDAZOLE (FLAGYL) IVPB 500 mg 500 mg 100 mL/hr   09/02/20 0748 New Bag/Given   ciprofloxacin (CIPRO) IVPB 400 mg 400 mg 200 mL/hr   09/02/20 1113 New Bag/Given   metroNIDAZOLE (FLAGYL) IVPB 500 mg 500 mg 100 mL/hr   09/02/20 2015 New Bag/Given   metroNIDAZOLE (FLAGYL) IVPB 500 mg 500 mg 100 mL/hr   09/02/20 2142 New Bag/Given   ciprofloxacin (CIPRO) IVPB 400 mg 400 mg 200 mL/hr   09/03/20 0322 New Bag/Given   metroNIDAZOLE (FLAGYL) IVPB 500 mg 500 mg 100 mL/hr   09/03/20 0017 New Bag/Given   ciprofloxacin (CIPRO) IVPB 400 mg 400 mg 200 mL/hr       Subjective: Patient seen and examined.  Today, she was more comfortable.  She is asking whether he can go home. Pain controlled with IV Dilaudid.  Had some nausea earlier today.  NG tube fell off in the morning.  Denies any vomiting or abdominal distention. Colostomy with no stool, very minimal serous fluid in the colostomy bag. Objective: Vitals:   09/02/20 1941 09/02/20 2339 09/03/20 0326 09/03/20 0758  BP: (!) 154/98 (!) 162/100 (!) 152/98 (!) 134/96  Pulse: (!) 107 (!) 103 (!) 101 (!) 107  Resp: 20 16 15 20   Temp: 98 F (36.7 C) 98.6 F (37 C) 97.9 F (36.6 C) 98.6 F (37 C)  TempSrc: Axillary Oral Oral Oral  SpO2: 95% 100% 95% 94%  Weight:      Height:        Intake/Output Summary (Last 24 hours) at 09/03/2020 1035 Last data filed at 09/03/2020 0800 Gross per 24 hour  Intake 2296.22 ml  Output 1075 ml  Net 1221.22 ml   Filed Weights   08/30/20 0022 08/30/20 0500 09/01/20 0448  Weight: 79.5 kg 79.5 kg 83.1 kg    Examination: General: Chronically sick looking, today looks comfortable after pain medication. Cardiovascular: S1-S2 normal.  No added sounds. Respiratory: Bilateral clear.  No added sounds. Gastrointestinal:  Pendulous.  Mild tenderness as anticipated from surgery Patient has left lower quadrant colostomy bag with no air or stool on it. Midline wound VAC. No bowel sounds present. Ext: No swelling or edema.  No cyanosis.    Data Reviewed: I have personally reviewed following labs and imaging studies  CBC: Recent Labs  Lab 08/31/20 1614 08/31/20 2209 09/01/20 0920 09/01/20 1422 09/02/20 0631 09/03/20 0146  WBC 13.9*  --  13.2* 17.1* 7.0 11.0*  NEUTROABS 12.3*  --  11.7* 15.1* 6.3 10.1*  HGB 7.5* 7.0* 10.3*  10.4* 11.8* 12.5 10.5*  HCT 23.5* 21.8* 30.8*  30.8* 34.7* 37.9 32.0*  MCV 95.5  --  91.1 90.1 92.9 94.1  PLT 124*  --  110* 122* 114* 494*   Basic Metabolic Panel: Recent Labs  Lab 08/30/20 0600 08/31/20 0048 08/31/20 1614 09/01/20 0920 09/01/20 1422 09/02/20 0631 09/03/20 0146  NA 136 124* 124*  --  125* 128* 127*  K 4.1 4.9 4.3  --  3.8 3.8 4.1  CL 99 89* 91*  --  86* 91* 94*  CO2 31 30 26   --  26 25 26   GLUCOSE 146* 280* 223*  --  173* 207* 161*  BUN 11 9 11   --  14 15 16   CREATININE 0.82 0.62 0.73  --  0.59 0.70 0.66  CALCIUM 8.4* 8.3* 7.8*  --  8.5* 8.3* 8.0*  MG 1.7 2.0  --  1.8  --   --  1.6*  PHOS 2.1* 4.0  --  3.8  --   --  3.5   GFR: Estimated Creatinine Clearance: 77.3 mL/min (by C-G formula based on SCr of 0.66 mg/dL). Liver Function Tests: Recent Labs  Lab 08/29/20 2003  AST 37  ALT <5  ALKPHOS 137*  BILITOT 0.7  PROT 4.5*  ALBUMIN 2.6*   No results for input(s): LIPASE, AMYLASE in the last 168 hours. No results for input(s): AMMONIA in the last 168 hours. Coagulation Profile: Recent Labs  Lab 08/29/20 2003  INR 1.1   Cardiac Enzymes: No results for input(s): CKTOTAL, CKMB, CKMBINDEX, TROPONINI in the last 168 hours. BNP (last 3 results) No results for input(s): PROBNP in the last 8760 hours. HbA1C: No results for input(s): HGBA1C in the last 72 hours. CBG: Recent Labs  Lab 09/02/20 1627 09/02/20 1944 09/02/20 2343  09/03/20 0328 09/03/20 0801  GLUCAP 239* 154* 169* 161* 165*   Lipid Profile: No results for input(s): CHOL, HDL, LDLCALC, TRIG, CHOLHDL, LDLDIRECT in the last 72 hours. Thyroid Function Tests: No results for input(s): TSH, T4TOTAL, FREET4, T3FREE, THYROIDAB in the last 72 hours. Anemia Panel: No results for input(s): VITAMINB12, FOLATE, FERRITIN, TIBC, IRON, RETICCTPCT in the last 72 hours. Sepsis Labs: No results for input(s): PROCALCITON, LATICACIDVEN in the last 168 hours.  Recent Results (from the past 240 hour(s))  Resp Panel by RT-PCR (Flu A&B, Covid) Nasopharyngeal Swab     Status: None   Collection Time: 08/29/20  8:30 PM   Specimen: Nasopharyngeal Swab; Nasopharyngeal(NP) swabs in vial transport medium  Result Value Ref Range Status   SARS Coronavirus 2 by RT PCR NEGATIVE NEGATIVE Final    Comment: (NOTE) SARS-CoV-2 target nucleic acids are NOT DETECTED.  The SARS-CoV-2 RNA is generally detectable in upper respiratory specimens during the acute phase of infection. The lowest concentration of SARS-CoV-2 viral copies this assay can detect is 138 copies/mL. A negative result does not preclude SARS-Cov-2 infection and should not be used as the sole basis for treatment or other patient management decisions. A negative result may occur with  improper specimen collection/handling, submission of specimen other than nasopharyngeal swab, presence of viral mutation(s) within the areas targeted by this assay, and inadequate number of viral copies(<138 copies/mL). A negative result must be combined with clinical observations, patient history, and epidemiological information. The expected result is Negative.  Fact Sheet for Patients:  EntrepreneurPulse.com.au  Fact Sheet for Healthcare Providers:  IncredibleEmployment.be  This test is no t yet approved or cleared by the [place] FDA and  has been authorized for detection and/or diagnosis  of SARS-CoV-2 by FDA under an Emergency Use Authorization (EUA). This EUA will remain  in effect (meaning this test can be used) for the duration of the COVID-19 declaration under Section 564(b)(1) of the Act, 21 U.S.C.section 360bbb-3(b)(1), unless the authorization is terminated  or revoked sooner.       Influenza A by PCR NEGATIVE NEGATIVE Final   Influenza B by PCR NEGATIVE NEGATIVE Final    Comment: (NOTE) The Xpert Xpress SARS-CoV-2/FLU/RSV plus assay is intended as an aid in the diagnosis of influenza from Nasopharyngeal  swab specimens and should not be used as a sole basis for treatment. Nasal washings and aspirates are unacceptable for Xpert Xpress SARS-CoV-2/FLU/RSV testing.  Fact Sheet for Patients: EntrepreneurPulse.com.au  Fact Sheet for Healthcare Providers: IncredibleEmployment.be  This test is not yet approved or cleared by the [place] FDA and has been authorized for detection and/or diagnosis of SARS-CoV-2 by FDA under an Emergency Use Authorization (EUA). This EUA will remain in effect (meaning this test can be used) for the duration of the COVID-19 declaration under Section 564(b)(1) of the Act, 21 U.S.C. section 360bbb-3(b)(1), unless the authorization is terminated or revoked.  Performed at [facility], [place] [address], [place], [place] [address]   MRSA PCR Screening     Status: Abnormal   Collection Time: 08/30/20 12:41 AM   Specimen: Nasopharyngeal  Result Value Ref Range Status   MRSA by PCR POSITIVE (A) NEGATIVE Final    Comment:        The GeneXpert MRSA Assay (FDA approved for NASAL specimens only), is one component of a comprehensive MRSA colonization surveillance program. It is not intended to diagnose MRSA infection nor to guide or monitor treatment for MRSA infections. RESULT CALLED TO, READ BACK BY AND VERIFIED WITH: Almyra Free RN 08/30/20 0503 JDW Performed at [facility],  [address] [place], [place] [address]   Aerobic/Anaerobic Culture w Gram Stain (surgical/deep wound)     Status: None (Preliminary result)   Collection Time: 09/01/20 11:22 PM   Specimen: PATH Cytology Peritoneal fluid; Body Fluid  Result Value Ref Range Status   Specimen Description PERITONEAL FLUID  Final   Special Requests IN SWABS PATIENT ON FOLLOWING CIPRO,FLAGYL  Final   Gram Stain   Final    RARE WBC PRESENT, PREDOMINANTLY PMN NO ORGANISMS SEEN    Culture   Final    NO GROWTH 1 DAY Performed at [facility], [address], [place], [place] [address]    Report Status PENDING  Incomplete         Radiology Studies: CT ABDOMEN PELVIS W CONTRAST  Result Date: 09/01/2020 CLINICAL DATA:  Abdominal pain and distension, history of recent GI bleed with embolization EXAM: CT ABDOMEN AND PELVIS WITH CONTRAST TECHNIQUE: Multidetector CT imaging of the abdomen and pelvis was performed using the standard protocol following bolus administration of intravenous contrast. CONTRAST:  163mL OMNIPAQUE IOHEXOL 350 MG/ML SOLN COMPARISON:  08/29/2020 FINDINGS: Lower chest: Mild bibasilar atelectasis is noted. 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 Hepatobiliary: The liver is diffusely fatty infiltrated. No focal mass lesion is seen. The gallbladder is within normal limits. Pancreas: Pancreas is unremarkable. Spleen: Normal in size without focal abnormality. Adrenals/Urinary Tract: Adrenal glands are within normal limits. Normal enhancement of the kidneys is seen. Normal excretion is noted bilaterally. Small cyst is noted in the left kidney stable from the previous exam. No obstructive changes are seen. The bladder is well distended. Stomach/Bowel: Diverticular changes noted within the colon. Changes of prior embolization are seen. The colon is predominately decompressed. The appendix is within normal limits. Considerable free air is noted within the anterior  aspect of the abdomen. The stomach is well distended with ingested food stuffs. Considerable dilatation of the jejunum is seen there is a caliber change noted in the mid abdomen best seen on image number 65 of series 3. The more distal small bowel appears within normal limits. Terminal ileum is unremarkable. Vascular/Lymphatic: Atherosclerotic calcifications  of the aorta are noted. No aneurysmal dilatation is seen. Changes of prior IMA embolization are noted consistent with the given clinical history. The proximal IMA is within normal limits. SMA appears within normal limits as well. No significant lymphadenopathy is noted. Reproductive: Status post hysterectomy. No adnexal masses. Other: Mild free fluid is noted within the pelvis. This is likely related to the underlying perforation. Musculoskeletal: Degenerative changes of the lumbar spine are noted. IMPRESSION: Interval development of partial small bowel obstruction involving the distal jejunum with evidence of perforation with free air and free fluid within the abdomen. These changes are new from the prior CT. A transition zone is noted in the mid abdomen although no definitive mass lesion is seen. Changes of prior embolization of the IMA branches. No findings to suggest active extravasation are seen. These results will be called to the ordering clinician or representative by the Radiologist Assistant, and communication documented in the PACS or [employer]. Electronically Signed   By: Inez Catalina M.D.   On: 09/01/2020 17:37        Scheduled Meds: . gabapentin  100 mg Oral TID  . insulin aspart  0-15 Units Subcutaneous Q4H  . insulin glargine  8 Units Subcutaneous QHS  . lidocaine  1 patch Transdermal Q24H  . mouth rinse  15 mL Mouth Rinse BID  . methylPREDNISolone (SOLU-MEDROL) injection  40 mg Intravenous Q12H  . metoprolol tartrate  25 mg Oral BID  . mupirocin ointment  1 application Nasal BID  . pantoprazole (PROTONIX) IV  40 mg  Intravenous Q24H   Continuous Infusions: . sodium chloride 125 mL/hr at 09/03/20 0205  . ciprofloxacin 400 mg (09/03/20 0649)  . magnesium sulfate bolus IVPB    . metronidazole 500 mg (09/03/20 0322)     LOS: 5 days    Time spent: 32 minutes    Barb Merino, MD [facility] Pager [phone]

## 2020-09-03 NOTE — Progress Notes (Signed)
2 Days Post-Op  Subjective: CC: Patient complains mainly of back pain. She is having some generalized abdominal pain that is greatest around colostomy and midline wound. NGT out overnight. She had some dry heaves and nausea this am. No emesis. Some air and sweat in colostomy bag. She did not get out of bed yesterday. Foley out. She is unsure if she voided since foley removal (I have reached out to the patients nurse).   Objective: Vital signs in last 24 hours: Temp:  [97.3 F (36.3 C)-98.6 F (37 C)] 98.6 F (37 C) (04/18 0758) Pulse Rate:  [101-115] 107 (04/18 0758) Resp:  [15-20] 20 (04/18 0758) BP: (134-162)/(96-123) 134/96 (04/18 0758) SpO2:  [94 %-100 %] 94 % (04/18 0758) Last BM Date: 09/02/20  Intake/Output from previous day: 04/17 0701 - 04/18 0700 In: 2296.2 [I.V.:1596.2; IV Piggyback:700] Out: 950 [Urine:950] Intake/Output this shift: Total I/O In: -  Out: 125 [Urine:125]  PE: Gen:  Alert, NAD, pleasant Card: Tachycardic Pulm:  Normal rate and effort  Abd: Soft, mild distension, generalized tenderness greatest around midline wound and colostomy that appears appropriate in the post operative setting. Hypoactive bowel sounds. Colostomy in place w/ scant air and sweat in the bag. Stoma difficult to visualize throughout colostomy bag. Midline wound with vac in place.  Ext:  1+ pedal edema b/l. SCD's in place Psych: A&Ox3  Skin: no rashes noted, warm and dry  Lab Results:  Recent Labs    09/02/20 0631 09/03/20 0146  WBC 7.0 11.0*  HGB 12.5 10.5*  HCT 37.9 32.0*  PLT 114* 101*   BMET Recent Labs    09/02/20 0631 09/03/20 0146  NA 128* 127*  K 3.8 4.1  CL 91* 94*  CO2 25 26  GLUCOSE 207* 161*  BUN 15 16  CREATININE 0.70 0.66  CALCIUM 8.3* 8.0*   PT/INR No results for input(s): LABPROT, INR in the last 72 hours. CMP     Component Value Date/Time   NA 127 (L) 09/03/2020 0146   NA 129 (L) 08/06/2020 1003   K 4.1 09/03/2020 0146   CL 94 (L)  09/03/2020 0146   CO2 26 09/03/2020 0146   GLUCOSE 161 (H) 09/03/2020 0146   BUN 16 09/03/2020 0146   BUN 6 08/06/2020 1003   CREATININE 0.66 09/03/2020 0146   CREATININE 0.85 08/02/2015 1710   CALCIUM 8.0 (L) 09/03/2020 0146   PROT 4.5 (L) 08/29/2020 2003   PROT 4.9 (L) 08/06/2020 1003   ALBUMIN 2.6 (L) 08/29/2020 2003   ALBUMIN 3.2 (L) 08/06/2020 1003   AST 37 08/29/2020 2003   ALT <5 08/29/2020 2003   ALKPHOS 137 (H) 08/29/2020 2003   BILITOT 0.7 08/29/2020 2003   BILITOT 0.4 08/06/2020 1003   GFRNONAA >60 09/03/2020 0146   GFRNONAA 78 08/02/2015 1710   GFRAA >60 01/17/2020 0546   GFRAA >89 08/02/2015 1710   Lipase     Component Value Date/Time   LIPASE 64 (H) 07/23/2020 0432       Studies/Results: CT ABDOMEN PELVIS W CONTRAST  Result Date: 09/01/2020 CLINICAL DATA:  Abdominal pain and distension, history of recent GI bleed with embolization EXAM: CT ABDOMEN AND PELVIS WITH CONTRAST TECHNIQUE: Multidetector CT imaging of the abdomen and pelvis was performed using the standard protocol following bolus administration of intravenous contrast. CONTRAST:  145mL OMNIPAQUE IOHEXOL 350 MG/ML SOLN COMPARISON:  08/29/2020 FINDINGS: Lower chest: Mild bibasilar atelectasis is noted. 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 Hepatobiliary: The liver is diffusely fatty infiltrated. No focal mass lesion is seen. The gallbladder is within normal limits. Pancreas: Pancreas is unremarkable. Spleen: Normal in size without focal abnormality. Adrenals/Urinary Tract: Adrenal glands are within normal limits. Normal enhancement of the kidneys is seen. Normal excretion is noted bilaterally. Small cyst is noted in the left kidney stable from the previous exam. No obstructive changes are seen. The bladder is well distended. Stomach/Bowel: Diverticular changes noted within the colon. Changes of prior embolization are seen. The colon is predominately decompressed. The  appendix is within normal limits. Considerable free air is noted within the anterior aspect of the abdomen. The stomach is well distended with ingested food stuffs. Considerable dilatation of the jejunum is seen there is a caliber change noted in the mid abdomen best seen on image number 65 of series 3. The more distal small bowel appears within normal limits. Terminal ileum is unremarkable. Vascular/Lymphatic: Atherosclerotic calcifications of the aorta are noted. No aneurysmal dilatation is seen. Changes of prior IMA embolization are noted consistent with the given clinical history. The proximal IMA is within normal limits. SMA appears within normal limits as well. No significant lymphadenopathy is noted. Reproductive: Status post hysterectomy. No adnexal masses. Other: Mild free fluid is noted within the pelvis. This is likely related to the underlying perforation. Musculoskeletal: Degenerative changes of the lumbar spine are noted. IMPRESSION: Interval development of partial small bowel obstruction involving the distal jejunum with evidence of perforation with free air and free fluid within the abdomen. These changes are new from the prior CT. A transition zone is noted in the mid abdomen although no definitive mass lesion is seen. Changes of prior embolization of the IMA branches. No findings to suggest active extravasation are seen. These results will be called to the ordering clinician or representative by the Radiologist Assistant, and communication documented in the PACS or [employer]. Electronically Signed   By: Inez Catalina M.D.   On: 09/01/2020 17:37    Anti-infectives: Anti-infectives (From admission, onward)   Start     Dose/Rate Route Frequency Ordered Stop   09/01/20 1930  ciprofloxacin (CIPRO) IVPB 400 mg        400 mg 200 mL/hr over 60 Minutes Intravenous Every 12 hours 09/01/20 1836     09/01/20 1930  metroNIDAZOLE (FLAGYL) IVPB 500 mg        500 mg 100 mL/hr over 60 Minutes  Intravenous Every 8 hours 09/01/20 1836         Assessment/Plan Hx of HTN DM2 Neurosarcoidosis/chronic debility and wheelchair-bound status - on chronic steroids. Currently on IV solumedrol to prevent adrenal insufficiency  OSA ABL anemia - hgb 10.5  Rhonda [name] is a 60 year old female with a history of GI bleed s/p angioembolization of a branch of the IMA on 4/13, who developed a necrotic section of proximal sigmoid colon with perforation and feculent peritonitis  - S/p Exploratory Laparotomy, Partial Colectomy (sigmoid), colostomy creation, Hartman's procedure on 09/01/2020 by Dr. Grandville Silos - POD #2 - NPO (okay for ice chips) - Continue Cipro/flagyl for at least 4 days post op due to feculent peritonitis - Mobilize. PT eval - WOC consult to assist with vac and ostomy care. Will plan to eval midline wound and stoma when WOC gets supplies later today.  - TOV ( I have reached out to the RN to see if patient has voided since foley removal)  FEN - NPO, IVF per [facility] VTE - SCDs, okay to  start chemical prophylaxis tomorrow if hgb stable  ID - Cipro/Flagyl   LOS: 5 days    Jillyn Ledger , [facility] Surgery 09/03/2020, 10:48 AM Please see Amion for pager number during day hours 7:00am-4:30pm

## 2020-09-03 NOTE — Consult Note (Signed)
WOC Nurse Consult Note: Patient receiving care in [facility] [phone]. Spouse present in room. Reason for Consult: NPWT change to abdominal wound Wound type: surgical Pressure Injury POA: Yes/No/NA Measurement: 23 cm x 4 cm x 2.2 cm Wound bed: 100% pink/red Drainage (amount, consistency, odor) serosanginous in canister Periwound: intact Dressing procedure/placement/frequency: one piece of black foam removed, one piece placed, drape applied, immediate seal obtained. Patient tolerated very well.    [place] Nurse ostomy follow up Stoma type/location: LUQ colostomy Stomal assessment/size:  Oval, 1.5 inches side to side, 1 inch top to bottom Peristomal assessment: intact Treatment options for stomal/peristomal skin: barrier ring and convex pouch. Belt at bedside, but not placed on patient as she is in the bed. Output: drops of serosanginous in existing pouch  Ostomy pouching: 1pc. Convex pouch [name] #[id number] and barrier ring, [name] [phone] Education provided:  Patient and spouse were given a complete pouch change teaching session from start to finish. Spouse was able to open/close pouch, provide light pressure to facilitate adhesion of barrier ring to skin and pouch backing. He asked many appropriate questions and all were answered to his expressed satisfaction. Enrolled patient in [place] Discharge program: Yes today.  Spouse was given the [employer] education folder with printed materials in it. Supplies for today's care ordered. Val Riles, RN, MSN, CWOCN, CNS-BC, pager [phone]

## 2020-09-04 DIAGNOSIS — I9589 Other hypotension: Secondary | ICD-10-CM | POA: Diagnosis not present

## 2020-09-04 DIAGNOSIS — E861 Hypovolemia: Secondary | ICD-10-CM | POA: Diagnosis not present

## 2020-09-04 DIAGNOSIS — K625 Hemorrhage of anus and rectum: Secondary | ICD-10-CM | POA: Diagnosis not present

## 2020-09-04 LAB — CBC WITH DIFFERENTIAL/PLATELET
Abs Immature Granulocytes: 0.21 10*3/uL — ABNORMAL HIGH (ref 0.00–0.07)
Basophils Absolute: 0 10*3/uL (ref 0.0–0.1)
Basophils Relative: 0 %
Eosinophils Absolute: 0 10*3/uL (ref 0.0–0.5)
Eosinophils Relative: 0 %
HCT: 27.1 % — ABNORMAL LOW (ref 36.0–46.0)
Hemoglobin: 8.9 g/dL — ABNORMAL LOW (ref 12.0–15.0)
Immature Granulocytes: 2 %
Lymphocytes Relative: 3 %
Lymphs Abs: 0.3 10*3/uL — ABNORMAL LOW (ref 0.7–4.0)
MCH: 30 pg (ref 26.0–34.0)
MCHC: 32.8 g/dL (ref 30.0–36.0)
MCV: 91.2 fL (ref 80.0–100.0)
Monocytes Absolute: 0.2 10*3/uL (ref 0.1–1.0)
Monocytes Relative: 2 %
Neutro Abs: 9.4 10*3/uL — ABNORMAL HIGH (ref 1.7–7.7)
Neutrophils Relative %: 93 %
Platelets: 103 10*3/uL — ABNORMAL LOW (ref 150–400)
RBC: 2.97 MIL/uL — ABNORMAL LOW (ref 3.87–5.11)
RDW: 17.2 % — ABNORMAL HIGH (ref 11.5–15.5)
WBC: 10.1 10*3/uL (ref 4.0–10.5)
nRBC: 0.3 % — ABNORMAL HIGH (ref 0.0–0.2)

## 2020-09-04 LAB — GLUCOSE, CAPILLARY
Glucose-Capillary: 157 mg/dL — ABNORMAL HIGH (ref 70–99)
Glucose-Capillary: 171 mg/dL — ABNORMAL HIGH (ref 70–99)
Glucose-Capillary: 171 mg/dL — ABNORMAL HIGH (ref 70–99)
Glucose-Capillary: 173 mg/dL — ABNORMAL HIGH (ref 70–99)
Glucose-Capillary: 184 mg/dL — ABNORMAL HIGH (ref 70–99)
Glucose-Capillary: 186 mg/dL — ABNORMAL HIGH (ref 70–99)

## 2020-09-04 LAB — BASIC METABOLIC PANEL
Anion gap: 6 (ref 5–15)
BUN: 7 mg/dL (ref 6–20)
CO2: 26 mmol/L (ref 22–32)
Calcium: 8 mg/dL — ABNORMAL LOW (ref 8.9–10.3)
Chloride: 97 mmol/L — ABNORMAL LOW (ref 98–111)
Creatinine, Ser: 0.53 mg/dL (ref 0.44–1.00)
GFR, Estimated: >60 mL/min (ref >60)
Glucose, Bld: 173 mg/dL — ABNORMAL HIGH (ref 70–99)
Potassium: 3.6 mmol/L (ref 3.5–5.1)
Sodium: 129 mmol/L — ABNORMAL LOW (ref 135–145)

## 2020-09-04 LAB — SURGICAL PATHOLOGY

## 2020-09-04 LAB — MAGNESIUM: Magnesium: 2.1 mg/dL (ref 1.7–2.4)

## 2020-09-04 MED ORDER — PREDNISONE 20 MG PO TABS
40.0000 mg | ORAL_TABLET | Freq: Two times a day (BID) | ORAL | Status: DC
  Administered 2020-09-04 – 2020-09-10 (×10): 40 mg via ORAL
  Filled 2020-09-04 (×11): qty 2

## 2020-09-04 NOTE — Progress Notes (Signed)
Patient-reported Beta-Lactam Allergy Assessment  Specific drug that caused reaction: Amoxicillin/ Clavulanate Reaction(s) that occurred: Hand swelling at IV site ?Assuming ampicillin/sulbactam as amoxicillin/clavulanate is an oral agent.  Antibiotics tolerated in the past: . Historical data obtained from the EMR:  Piperacillin/ Tazobactam, ceftriaxone, and cephalexin  Information received from:  Medical record  Due to reported unknown reaction, the following questions were asked about the Amoxicillin/ Clavulanate allergy:  How long after taking the drug did the reaction occur?  Unknown/ cannot recall  How long ago did the reaction occur?  Unknown/ childhood reaction  What was done to manage the reaction?  Unknown   Based on the above interview, it has been deemed appropriate for patient to be administered Penicillins as the patient's reaction likely represents extravasation or a temporary intolerance to the drug. Patient's intolerance does not represent allergy, and the patient can receive penicillin-based antibiotics. Her allergy was removed from the electronic medical record.

## 2020-09-04 NOTE — Significant Event (Signed)
Pt complained of nausea and throwing up blood. Small amount of green brown bile like smear was noted on blanket. Doctor called and made aware of patient statements. No new orders.

## 2020-09-04 NOTE — NC FL2 (Signed)
[place] LEVEL OF CARE SCREENING TOOL     IDENTIFICATION  Patient Name: Rhonda [name] Birthdate: 1961-01-21 Sex: female Admission Date (Current Location): 08/29/2020  [facility] and [place] Number:  Herbalist and Address:  [facility] [place], [place] [address], [place], [place] [address]      Provider Number: [id number]  Attending Physician Name and Address:  Barb Merino, MD  Relative Name and Phone Number:  [name], spouse, [phone]    Current Level of Care: Hospital Recommended Level of Care: [place] Prior Approval Number:    Date Approved/Denied:   PASRR Number: [id number]  Discharge Plan: SNF    Current Diagnoses: Patient Active Problem List   Diagnosis Date Noted  . Hypotension   . Rectal bleeding   . AKI (acute kidney injury) ([place])   . GI bleed 08/29/2020  . 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 High risk medication use 08/06/2020  . Weakness 08/06/2020  . Type 2 diabetes mellitus without complication, with long-term current use of insulin ([place]) 08/06/2020  . Acute lower GI bleeding 07/23/2020  . Hyperosmolar hyperglycemic state (HHS) ([place]) 07/23/2020  . Diverticulitis of large intestine with bleeding 07/23/2020  . Liver lesion 07/23/2020  . 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 HLD (hyperlipidemia) 01/12/2020  . Acute lower UTI 01/12/2020  . AMS (altered mental status) 01/11/2020  . Memory changes 01/08/2020  . Altered mental status   . Hyponatremia   . Speech disturbance   . Demyelinating changes in brain ([place]) 11/13/2019  . Left pontine stroke ([place]) 05/23/2019  . Transaminitis 05/23/2019  . Nausea and vomiting 05/23/2019  . Leukocytosis 05/23/2019  . Thoracic lymphadenopathy 08/26/2018  . Axillary lymphadenopathy 08/26/2018  . Piriformis syndrome of right side 09/29/2016  . OSA (obstructive sleep apnea) 09/27/2015  . Right hip pain 04/11/2015  . Essential hypertension 11/07/2014  . Chronic low back pain  11/07/2014  . Prediabetes 11/07/2014  . Vitamin D deficiency 11/07/2014    Orientation RESPIRATION BLADDER Height & Weight     Self,Time,Situation,Place  Normal Continent,External catheter Weight: 183 lb 3.2 oz (83.1 kg) Height:  5\' 3"  (160 cm)  BEHAVIORAL SYMPTOMS/MOOD NEUROLOGICAL BOWEL NUTRITION STATUS      Continent,Colostomy Diet (Please see DC Summary)  AMBULATORY STATUS COMMUNICATION OF NEEDS Skin   Extensive Assist Verbally Surgical wounds,Other (Comment),Wound Vac (Closed incision on chest; incision on abdomen with wound vac; skin tear on thigh;)                       Personal Care Assistance Level of Assistance  Bathing,Feeding,Dressing Bathing Assistance: Maximum assistance Feeding assistance: Limited assistance Dressing Assistance: Limited assistance     Functional Limitations Info             SPECIAL CARE FACTORS FREQUENCY  PT (By licensed PT),OT (By licensed OT)     PT Frequency: 5x/week OT Frequency: 5x/week            Contractures Contractures Info: Not present    Additional Factors Info  Code Status,Allergies,Isolation Precautions,Insulin Sliding Scale Code Status Info: Full Allergies Info: Augmentin (Amoxicillin-pot Clavulanate), Acetaminophen   Insulin Sliding Scale Info: See dc summary for dose Isolation Precautions Info: MRSA     Current Medications (09/04/2020):  This is the current hospital active medication list Current Facility-Administered Medications  Medication Dose Route Frequency Provider Last Rate Last Admin  . 0.9 %  sodium chloride infusion   Intravenous Continuous Georganna Skeans, MD 125 mL/hr at 09/04/20  [place] at 09/04/20 0132  . ciprofloxacin (CIPRO) IVPB 400 mg  400 mg Intravenous Q12H Georganna Skeans, MD 200 mL/hr at 09/04/20 0614 400 mg at 09/04/20 0614  . gabapentin (NEURONTIN) capsule 100 mg  100 mg Oral TID Georganna Skeans, MD   100 mg at 09/04/20 0851  . HYDROmorphone (DILAUDID) injection 0.5-1 mg  0.5-1 mg  Intravenous Q2H PRN Stechschulte, Nickola Major, MD   0.5 mg at 09/03/20 1856  . insulin aspart (novoLOG) injection 0-15 Units  0-15 Units Subcutaneous Q4H Georganna Skeans, MD   3 Units at 09/04/20 [phone]  . insulin glargine (LANTUS) injection 8 Units  8 Units Subcutaneous QHS Georganna Skeans, MD   8 Units at 09/03/20 2056  . lidocaine (LIDODERM) 5 % 1 patch  1 patch Transdermal Q24H Georganna Skeans, MD   1 patch at 09/04/20 0533  . MEDLINE mouth rinse  15 mL Mouth Rinse BID Georganna Skeans, MD   15 mL at 09/04/20 0852  . methylPREDNISolone sodium succinate (SOLU-MEDROL) 40 mg/mL injection 40 mg  40 mg Intravenous Q12H Barb Merino, MD   40 mg at 09/04/20 0852  . metoprolol tartrate (LOPRESSOR) injection 5 mg  5 mg Intravenous Q6H PRN Barb Merino, MD      . metoprolol tartrate (LOPRESSOR) tablet 25 mg  25 mg Oral BID Georganna Skeans, MD   25 mg at 09/04/20 0851  . metroNIDAZOLE (FLAGYL) IVPB 500 mg  500 mg Intravenous Q8H Georganna Skeans, MD 100 mL/hr at 09/04/20 0308 500 mg at 09/04/20 0308  . naphazoline-glycerin (CLEAR EYES REDNESS) ophth solution 1-2 drop  1-2 drop Both Eyes QID PRN Georganna Skeans, MD   1 drop at 09/03/20 0519  . ondansetron (ZOFRAN) injection 4 mg  4 mg Intravenous Q6H PRN Georganna Skeans, MD   4 mg at 09/04/20 0128  . oxyCODONE (Oxy IR/ROXICODONE) immediate release tablet 5 mg  5 mg Oral Q6H PRN Georganna Skeans, MD   5 mg at 09/04/20 0404  . pantoprazole (PROTONIX) injection 40 mg  40 mg Intravenous Q24H Georganna Skeans, MD   40 mg at 09/04/20 0851  . polyethylene glycol (MIRALAX / GLYCOLAX) packet 17 g  17 g Oral Daily PRN Georganna Skeans, MD         Discharge Medications: Please see discharge summary for a list of discharge medications.  Relevant Imaging Results:  Relevant Lab Results:   Additional Information SSN: [id number]. Pfizer vaccine on 12/23/19 and 01/24/20.  Benard Halsted, LCSW

## 2020-09-04 NOTE — Progress Notes (Signed)
[facility] Progress Note  3 Days Post-Op  Subjective: CC-  Abdomen still sore but pain fairly well controlled. Denies n/v, feels hungry. No colostomy output.  Worked with therapies yesterday, recommending SNF.  Objective: Vital signs in last 24 hours: Temp:  [97.6 F (36.4 C)-98.8 F (37.1 C)] 98.2 F (36.8 C) (04/19 0836) Pulse Rate:  [95-104] 103 (04/19 0836) Resp:  [14-20] 19 (04/19 0836) BP: (142-186)/(91-105) 142/92 (04/19 0836) SpO2:  [96 %-100 %] 100 % (04/19 0836) Last BM Date: 09/01/20  Intake/Output from previous day: 04/18 0701 - 04/19 0700 In: 3715.4 [I.V.:2865.4; IV Piggyback:850] Out: 825 [Urine:825] Intake/Output this shift: Total I/O In: -  Out: 350 [Urine:350]  PE: Gen:  Alert, NAD, pleasant Pulm:  Normal rate and effort  Abd: Soft, mild distension, mild generalized tenderness, hypoactive bowel sounds. Colostomy in place w/ scant air and sweat in bag. Stoma difficult to visualize through colostomy bag. Vac to midline wound with good seal   Lab Results:  Recent Labs    09/03/20 0146 09/04/20 0137  WBC 11.0* 10.1  HGB 10.5* 8.9*  HCT 32.0* 27.1*  PLT 101* 103*   BMET Recent Labs    09/03/20 0146 09/04/20 0137  NA 127* 129*  K 4.1 3.6  CL 94* 97*  CO2 26 26  GLUCOSE 161* 173*  BUN 16 7  CREATININE 0.66 0.53  CALCIUM 8.0* 8.0*   PT/INR No results for input(s): LABPROT, INR in the last 72 hours. CMP     Component Value Date/Time   NA 129 (L) 09/04/2020 0137   NA 129 (L) 08/06/2020 1003   K 3.6 09/04/2020 0137   CL 97 (L) 09/04/2020 0137   CO2 26 09/04/2020 0137   GLUCOSE 173 (H) 09/04/2020 0137   BUN 7 09/04/2020 0137   BUN 6 08/06/2020 1003   CREATININE 0.53 09/04/2020 0137   CREATININE 0.85 08/02/2015 1710   CALCIUM 8.0 (L) 09/04/2020 0137   PROT 4.5 (L) 08/29/2020 2003   PROT 4.9 (L) 08/06/2020 1003   ALBUMIN 2.6 (L) 08/29/2020 2003   ALBUMIN 3.2 (L) 08/06/2020 1003   AST 37 08/29/2020 2003   ALT <5  08/29/2020 2003   ALKPHOS 137 (H) 08/29/2020 2003   BILITOT 0.7 08/29/2020 2003   BILITOT 0.4 08/06/2020 1003   GFRNONAA >60 09/04/2020 0137   GFRNONAA 78 08/02/2015 1710   GFRAA >60 01/17/2020 0546   GFRAA >89 08/02/2015 1710   Lipase     Component Value Date/Time   LIPASE 64 (H) 07/23/2020 0432       Studies/Results: No results found.  Anti-infectives: Anti-infectives (From admission, onward)   Start     Dose/Rate Route Frequency Ordered Stop   09/01/20 1930  ciprofloxacin (CIPRO) IVPB 400 mg        400 mg 200 mL/hr over 60 Minutes Intravenous Every 12 hours 09/01/20 1836     09/01/20 1930  metroNIDAZOLE (FLAGYL) IVPB 500 mg        500 mg 100 mL/hr over 60 Minutes Intravenous Every 8 hours 09/01/20 1836         Assessment/Plan Hx of HTN DM2 Neurosarcoidosis/chronic debility and wheelchair-bound status - on chronic steroids. Currently on IV solumedrol to prevent adrenal insufficiency  OSA ABL anemia - hgb 8.9  Rhonda [name] is a 60 year old female with a history of GI bleed s/p angioembolization of a branch of the IMA on 4/13, who developed a necrotic section of proximal sigmoid colon with perforation and feculent peritonitis  -  S/p Exploratory Laparotomy, Partial Colectomy (sigmoid), colostomy creation, Hartman's procedure on 09/01/2020 by Dr. Grandville Silos - POD #3 - NPO (okay for ice chips), await return in bowel function - Continue Cipro/flagyl for at least 4 days post op due to feculent peritonitis - Continue PT/OT, mobilize - recommending SNF - WOC following for ostomy education - vac changes MWF  FEN - NPO, IVF per TRH VTE - SCDs, per primary ID - Cipro/Flagyl 4/16>>   LOS: 6 days    [name], [facility] Surgery 09/04/2020, 10:56 AM Please see Amion for pager number during day hours 7:00am-4:30pm

## 2020-09-04 NOTE — Progress Notes (Signed)
PROGRESS NOTE    Rhonda [name]  JQZ:[id number] DOB: [birth year] DOA: 08/29/2020 PCP: [place].    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   Significant events: 3/8, admission with rectal bleeding, EGD and colonoscopy negative.   4/4-4/6 admitted with hematochezia, symptomatically treated, capsule endoscopy and discharged home. 4/13 presented with hematochezia, CT angiogram with acute bleeding in the descending colon status post coil embolization of the distal [place] branch of the superior sigmoidal artery. Insulin infusion for high blood sugars.  Admitted to ICU. 4/15, transferred out of ICU.  4/16, afternoon after eating lunch, patient had severe abdominal pain.  Relieved with pain medication.  A CT scan showed visceral perforation.  Underwent urgent exploratory laparotomy and found to have necrotic sigmoid colon.  Status post Jeanette Caprice procedure with colostomy and wound VAC for midline incision.   Assessment & Plan:   Active Problems:   GI bleed   Hypotension   Rectal bleeding   AKI (acute kidney injury) ([place])  Perforated sigmoid colon with history of diverticulosis and multiple procedures.  IMA embolization 4/13. Emergency exploratory laparotomy and Hartman's procedure 4/16. Postop management as per surgery. Chest physiotherapy and incentive spirometry, adequate pain management, using IV Dilaudid.  Not ready to use oral pain medications yet as oral intake is not reliable. Patient has decreased mobility at baseline.  Will need skilled care on discharge. N.p.o. with ice chips.  Start oral medications with sips. Peritoneal cultures negative so far.Remains on  ciprofloxacin and Flagyl due to feculent peritonitis. Waiting for return of bowel function.  Acute lower GI bleeding, diverticular bleeding.  Hemorrhagic shock secondary to acute GI bleeding and anemia of blood loss: Known diverticular disease. status post coil embolization 4/13.  Hemoglobin dropped since admission but is stable and not needing for any more transfusion.    Hopefully her disease part of the sigmoid colon is removed and will stop bleeding further.    Acute renal failure: Due to #1.  Improved and normalized.  Hemoglobin 10.5.  Type 2 diabetes with hyperglycemia: N.p.o. now.  On IV fluids.  We will keep on half dose of long-acting insulin and sliding scale insulin.  Sugars acceptable.  Neurosarcoidosis/chronic debility and wheelchair-bound status:  Patient on prednisone 40 mg twice a day at home.  Given Solu-Medrol perioperative.  Will change to prednisone today.  Continue gabapentin.  Obstructive sleep apnea: Declined CPAP.  Not using.  Hypertension: On metoprolol.  Can take by mouth.  Hypochloremic hyponatremia: Normalizing.  Hypomagnesemia: Replaced and adequate..  DVT prophylaxis: SCDs Start: 08/29/20 2256   Code Status: Full code Family Communication: Husband at the bedside. Disposition Plan: Status is: Inpatient  Remains inpatient appropriate because:Inpatient level of care appropriate due to severity of illness   Dispo: The patient is from: Home              Anticipated d/c is to: Skilled nursing facility.              Patient currently is not medically stable to d/c.   Difficult to place patient no         Consultants:   PCCM  Gastroenterology  Interventional radiology  Surgery.  Procedures:   Embolization inferior mesenteric artery 4/13  Hartman's procedure 4/16.  Antimicrobials:  Antibiotics Given (last  72 hours)    Date/Time Action Medication Dose Rate   09/01/20 2041 New Bag/Given   metroNIDAZOLE (FLAGYL) IVPB 500 mg 500 mg 100  mL/hr   09/01/20 2207 New Bag/Given   ciprofloxacin (CIPRO) IVPB 400 mg 400 mg 200 mL/hr   09/02/20 0517 New Bag/Given  [pt in PACU]   metroNIDAZOLE (FLAGYL) IVPB 500 mg 500 mg 100 mL/hr   09/02/20 0748 New Bag/Given   ciprofloxacin (CIPRO) IVPB 400 mg 400 mg 200 mL/hr   09/02/20 1113 New Bag/Given   metroNIDAZOLE (FLAGYL) IVPB 500 mg 500 mg 100 mL/hr   09/02/20 2015 New Bag/Given   metroNIDAZOLE (FLAGYL) IVPB 500 mg 500 mg 100 mL/hr   09/02/20 2142 New Bag/Given   ciprofloxacin (CIPRO) IVPB 400 mg 400 mg 200 mL/hr   09/03/20 0322 New Bag/Given   metroNIDAZOLE (FLAGYL) IVPB 500 mg 500 mg 100 mL/hr   09/03/20 0649 New Bag/Given   ciprofloxacin (CIPRO) IVPB 400 mg 400 mg 200 mL/hr   09/03/20 1238 New Bag/Given   metroNIDAZOLE (FLAGYL) IVPB 500 mg 500 mg 100 mL/hr   09/03/20 1954 New Bag/Given   metroNIDAZOLE (FLAGYL) IVPB 500 mg 500 mg 100 mL/hr   09/03/20 2100 New Bag/Given   ciprofloxacin (CIPRO) IVPB 400 mg 400 mg 200 mL/hr   09/04/20 0308 New Bag/Given   metroNIDAZOLE (FLAGYL) IVPB 500 mg 500 mg 100 mL/hr   09/04/20 1093 New Bag/Given   ciprofloxacin (CIPRO) IVPB 400 mg 400 mg 200 mL/hr   09/04/20 1143 New Bag/Given   metroNIDAZOLE (FLAGYL) IVPB 500 mg 500 mg 100 mL/hr       Subjective: Patient seen and examined.  Husband at the bedside.  She had received pain medication and currently denies any complaints.  Denies any nausea or vomiting. Patient and husband stated that she has occasional outburst of anger and frustration when she is very sick and wanted to apologize if she did anything wrong last night. Remains afebrile.  Colostomy bag is empty.   Objective: Vitals:   09/03/20 2338 09/04/20 0401 09/04/20 0836 09/04/20 1129  BP: (!) 186/105 (!) 156/91 (!) 142/92 (!) 152/97  Pulse: 100 (!) 102 (!) 103 100  Resp: 20 17 19 14   Temp: 97.6 F (36.4 C) 98.8 F (37.1 C) 98.2 F (36.8 C) 98.6 F (37 C)  TempSrc: Axillary Oral Oral Oral  SpO2: 96% 97% 100% 100%  Weight:       Height:        Intake/Output Summary (Last 24 hours) at 09/04/2020 1252 Last data filed at 09/04/2020 1132 Gross per 24 hour  Intake 3465.38 ml  Output 1550 ml  Net 1915.38 ml   Filed Weights   08/30/20 0022 08/30/20 0500 09/01/20 0448  Weight: 79.5 kg 79.5 kg 83.1 kg    Examination: General: Chronically sick looking, today looks comfortable after pain medication. On room air. Patient has cushingoid face. Cardiovascular: S1-S2 normal.  No added sounds. Respiratory: Bilateral clear.  No added sounds. Gastrointestinal: Pendulous.  Mild tenderness as anticipated from surgery Patient has left lower quadrant colostomy bag that is empty. Midline wound VAC. Bowel sounds present and sluggish. Ext: No swelling or edema.  No cyanosis.    Data Reviewed: I have personally reviewed following labs and imaging studies  CBC: Recent Labs  Lab 09/01/20 0920 09/01/20 1422 09/02/20 0631 09/03/20 0146 09/04/20 0137  WBC 13.2* 17.1* 7.0 11.0* 10.1  NEUTROABS 11.7* 15.1* 6.3 10.1* 9.4*  HGB 10.3*  10.4* 11.8* 12.5 10.5* 8.9*  HCT 30.8*  30.8* 34.7* 37.9 32.0* 27.1*  MCV 91.1 90.1 92.9 94.1 91.2  PLT 110* 122* 114* 101* 003*   Basic Metabolic Panel: Recent Labs  Lab 08/30/20 0600 08/31/20 0048 08/31/20 1614 09/01/20 0920 09/01/20 1422 09/02/20 0631 09/03/20 0146 09/04/20 0137  NA 136 124* 124*  --  125* 128* 127* 129*  K 4.1 4.9 4.3  --  3.8 3.8 4.1 3.6  CL 99 89* 91*  --  86* 91* 94* 97*  CO2 31 30 26   --  26 25 26 26   GLUCOSE 146* 280* 223*  --  173* 207* 161* 173*  BUN 11 9 11   --  14 15 16 7   CREATININE 0.82 0.62 0.73  --  0.59 0.70 0.66 0.53  CALCIUM 8.4* 8.3* 7.8*  --  8.5* 8.3* 8.0* 8.0*  MG 1.7 2.0  --  1.8  --   --  1.6* 2.1  PHOS 2.1* 4.0  --  3.8  --   --  3.5  --    GFR: Estimated Creatinine Clearance: 77.3 mL/min (by C-G formula based on SCr of 0.53 mg/dL). Liver Function Tests: Recent Labs  Lab 08/29/20 2003  AST 37  ALT <5  ALKPHOS 137*   BILITOT 0.7  PROT 4.5*  ALBUMIN 2.6*   No results for input(s): LIPASE, AMYLASE in the last 168 hours. No results for input(s): AMMONIA in the last 168 hours. Coagulation Profile: Recent Labs  Lab 08/29/20 2003  INR 1.1   Cardiac Enzymes: No results for input(s): CKTOTAL, CKMB, CKMBINDEX, TROPONINI in the last 168 hours. BNP (last 3 results) No results for input(s): PROBNP in the last 8760 hours. HbA1C: No results for input(s): HGBA1C in the last 72 hours. CBG: Recent Labs  Lab 09/03/20 2031 09/03/20 2341 09/04/20 0403 09/04/20 0841 09/04/20 1131  GLUCAP 130* 184* 157* 184* 171*   Lipid Profile: No results for input(s): CHOL, HDL, LDLCALC, TRIG, CHOLHDL, LDLDIRECT in the last 72 hours. Thyroid Function Tests: No results for input(s): TSH, T4TOTAL, FREET4, T3FREE, THYROIDAB in the last 72 hours. Anemia Panel: No results for input(s): VITAMINB12, FOLATE, FERRITIN, TIBC, IRON, RETICCTPCT in the last 72 hours. Sepsis Labs: No results for input(s): PROCALCITON, LATICACIDVEN in the last 168 hours.  Recent Results (from the past 240 hour(s))  Resp Panel by RT-PCR (Flu A&B, Covid) Nasopharyngeal Swab     Status: None   Collection Time: 08/29/20  8:30 PM   Specimen: Nasopharyngeal Swab; Nasopharyngeal(NP) swabs in vial transport medium  Result Value Ref Range Status   SARS Coronavirus 2 by RT PCR NEGATIVE NEGATIVE Final    Comment: (NOTE) SARS-CoV-2 target nucleic acids are NOT DETECTED.  The SARS-CoV-2 RNA is generally detectable in upper respiratory specimens during the acute phase of infection. The lowest concentration of SARS-CoV-2 viral copies this assay can detect is 138 copies/mL. A negative result does not preclude SARS-Cov-2 infection and should not be used as the sole basis for treatment or other patient management decisions. A negative result may occur with  improper specimen collection/handling, submission of specimen other than nasopharyngeal swab, presence  of viral mutation(s) within the areas targeted by this assay, and inadequate number of viral copies(<138 copies/mL). A negative result must be combined with clinical observations, patient history, and epidemiological information. The expected result is Negative.  Fact Sheet for Patients:  EntrepreneurPulse.com.au  Fact Sheet for Healthcare Providers:  IncredibleEmployment.be  This test is no t yet approved or cleared by the [place] and  has been authorized  for detection and/or diagnosis of SARS-CoV-2 by FDA under an Emergency Use Authorization (EUA). This EUA will remain  in effect (meaning this test can be used) for the duration of the COVID-19 declaration under Section 564(b)(1) of the Act, 21 U.S.C.section 360bbb-3(b)(1), unless the authorization is terminated  or revoked sooner.       Influenza A by PCR NEGATIVE NEGATIVE Final   Influenza B by PCR NEGATIVE NEGATIVE Final    Comment: (NOTE) The Xpert Xpress SARS-CoV-2/FLU/RSV plus assay is intended as an aid in the diagnosis of influenza from Nasopharyngeal swab specimens and should not be used as a sole basis for treatment. Nasal washings and aspirates are unacceptable for Xpert Xpress SARS-CoV-2/FLU/RSV testing.  Fact Sheet for Patients: EntrepreneurPulse.com.au  Fact Sheet for Healthcare Providers: IncredibleEmployment.be  This test is not yet approved or cleared by the [place] FDA and has been authorized for detection and/or diagnosis of SARS-CoV-2 by FDA under an Emergency Use Authorization (EUA). This EUA will remain in effect (meaning this test can be used) for the duration of the COVID-19 declaration under Section 564(b)(1) of the Act, 21 U.S.C. section 360bbb-3(b)(1), unless the authorization is terminated or revoked.  Performed at [facility], [place] [address] [place], [place] [address]   MRSA PCR Screening      Status: Abnormal   Collection Time: 08/30/20 12:41 AM   Specimen: Nasopharyngeal  Result Value Ref Range Status   MRSA by PCR POSITIVE (A) NEGATIVE Final    Comment:        The GeneXpert MRSA Assay (FDA approved for NASAL specimens only), is one component of a comprehensive MRSA colonization surveillance program. It is not intended to diagnose MRSA infection nor to guide or monitor treatment for MRSA infections. RESULT CALLED TO, READ BACK BY AND VERIFIED WITH: Almyra Free RN 08/30/20 0503 JDW Performed at [facility], [address] [place], [place] [address]   Aerobic/Anaerobic Culture w Gram Stain (surgical/deep wound)     Status: None (Preliminary result)   Collection Time: 09/01/20 11:22 PM   Specimen: PATH Cytology Peritoneal fluid; Body Fluid  Result Value Ref Range Status   Specimen Description PERITONEAL FLUID  Final   Special Requests IN SWABS PATIENT ON FOLLOWING CIPRO,FLAGYL  Final   Gram Stain   Final    RARE WBC PRESENT, PREDOMINANTLY PMN NO ORGANISMS SEEN    Culture   Final    NO GROWTH 1 DAY Performed at [facility], [address], [place], [place] [address]    Report Status PENDING  Incomplete         Radiology Studies: No results found.      Scheduled Meds: . gabapentin  100 mg Oral TID  . insulin aspart  0-15 Units Subcutaneous Q4H  . insulin glargine  8 Units Subcutaneous QHS  . lidocaine  1 patch Transdermal Q24H  . mouth rinse  15 mL Mouth Rinse BID  . metoprolol tartrate  25 mg Oral BID  . pantoprazole (PROTONIX) IV  40 mg Intravenous Q24H  . predniSONE  40 mg Oral BID   Continuous Infusions: . sodium chloride 125 mL/hr at 09/04/20 0132  . ciprofloxacin 400 mg (09/04/20 3532)  . metronidazole 500 mg (09/04/20 1143)     LOS: 6 days    Time spent: 30 minutes    Barb Merino, MD [facility] Pager [phone]

## 2020-09-04 NOTE — Evaluation (Signed)
Occupational Therapy Evaluation Patient Details Name: Rhonda [name] MRN: [id number] DOB: Aug 07, 1960 Today's Date: 09/04/2020    History of Present Illness Pt is 60 yo female presented to [facility] on 08/29/20 with rectal bleeding (several recent admissions for similar). Pt admitted with hemorrhagic shock.  She is s/p IR for coil embolization of intraluminal extravasation for a distal arcade branch of the IMA on 08/29/20.  She developed abdominal pain and found to have SBO with bowel perforation s/p emergent exp lap, partial colectomy, colostomy, Hartman's and wound vac application 3/87/56.  Medical hx inlcudes DM2, seizures, HTN, DDD, recurrent GIB.  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   Clinical Impression   PTA, pt lives with spouse who provides light assist for ADLs though has required increased assist recently. Pt typically able to transfer to/from wheelchair, use wheelchair for mobility with light assist, and take steps in/out of bathroom with RW (bathroom not w/c accessible). Pt presents now with deficits in pain, strength, cognition, endurance and balance. Husband present and supportive during session. On initiation of bed mobility, pt noted with saturated bed. Pt unable to fully come to sitting EOB with 1 person assist today so linen change and ADLs completed at bed level with NT assist. Pt overall Mod A for UB ADLs and Total A x 1-2 for LB ADLs. Collaborated with pt/spouse on DC plans with ST rehab at SNF indicated due to below PLOF to maximize safety, improve independence and reduce caregiver burden. Plan to progress EOB/OOB attempts with 2 person assist next session.   Follow Up Recommendations  SNF;Supervision/Assistance - 24 hour    Equipment Recommendations  Hospital bed (to be continually assessed and updated)    Recommendations for Other Services       Precautions / Restrictions Precautions Precautions: Fall Precaution Comments:  Abdomen incision with wound vac Restrictions Weight Bearing Restrictions: No      Mobility Bed Mobility Overal bed mobility: Needs Assistance Bed Mobility: Rolling;Supine to Sit Rolling: Max assist;+2 for safety/equipment   Supine to sit: Total assist     General bed mobility comments: Max A x 1 -2 for rolling in bed for care. Attempted to sit EOB but unable to with 1 person assist today. Pt with decreased initiation, attempted to hold around therapist's neck for sitting EOB - educated and redirected for safety    Transfers                 General transfer comment: unable to attempt due to weakness/pain/unsafe    Balance                                           ADL either performed or assessed with clinical judgement   ADL Overall ADL's : Needs assistance/impaired Eating/Feeding: Set up;Sitting   Grooming: Set up;Bed level   Upper Body Bathing: Moderate assistance;Bed level   Lower Body Bathing: Total assistance;Bed level   Upper Body Dressing : Moderate assistance;Bed level   Lower Body Dressing: Total assistance;Bed level;+2 for safety/equipment       Toileting- Clothing Manipulation and Hygiene: Total assistance;Bed level;+2 for safety/equipment         General ADL Comments: Pt with purewick malfunction and saturated bed on entry. OT assisted NT in linen change, bathing and dressing bed level afterwards. 1-2 assist for complete rolling in bed for these tasks  Vision Baseline Vision/History: Wears glasses Patient Visual Report: No change from baseline Vision Assessment?: No apparent visual deficits     Perception     Praxis      Pertinent Vitals/Pain Pain Assessment: Faces Faces Pain Scale: Hurts little more Pain Location: abdomen, back with rolling Pain Descriptors / Indicators: Grimacing;Guarding Pain Intervention(s): Monitored during session;Limited activity within patient's tolerance;Repositioned     Hand Dominance  Right   Extremity/Trunk Assessment Upper Extremity Assessment Upper Extremity Assessment: RUE deficits/detail;LUE deficits/detail RUE Deficits / Details: ROM WFL; does have generalized weakness LUE Deficits / Details: ROM WFL;  does have generalized weakness   Lower Extremity Assessment Lower Extremity Assessment: Defer to PT evaluation   Cervical / Trunk Assessment Cervical / Trunk Assessment: Normal   Communication Communication Communication: No difficulties   Cognition Arousal/Alertness: Awake/alert;Lethargic Behavior During Therapy: WFL for tasks assessed/performed;Flat affect Overall Cognitive Status: History of cognitive impairments - at baseline                                 General Comments: Spouse reports pt with declined short term memory baseline.  Seems near baseline, flat affect with eyes closed most of session. husband reports psychosis overnight that hinders memory, recall.   General Comments  Collaborated with pt's husband in room on CLOF, desire for rehab to prevent injury and maximize independence at home    Exercises     Shoulder Instructions      Home Living Family/patient expects to be discharged to:: Private residence Living Arrangements: Spouse/significant other Available Help at Discharge: Family;Available 24 hours/day Type of Home: House Home Access: Ramped entrance     Home Layout: Two level;1/2 bath on main level;Able to live on main level with bedroom/bathroom Alternate Level Stairs-Number of Steps: flight Alternate Level Stairs-Rails: Right Bathroom Shower/Tub: Tub/shower unit   Bathroom Toilet: Handicapped height Bathroom Accessibility: Yes How Accessible: Accessible via walker Home Equipment: Bedside commode;Walker - 2 wheels;Wheelchair - manual   Additional Comments: bedroom moved to first level recently, reports she has been w/c bound but is able to stand and walk into bathroom with RW to toilet, chair. She cannot get  her wheelchair in the bathroom.. sleeps on the couch usually      Prior Functioning/Environment Level of Independence: Needs assistance  Gait / Transfers Assistance Needed: W/c for primary mobility; was working with therapy to get back to walking; typically could get to w/c on her own with RW. Used RW to walk into bathroom due to inability of wheelchair to fit. ADL's / Homemaking Assistance Needed: spouse reports light assist for sponge bathing, dressing tasks, donning pants above waist after toileting. Has basin set up for baths prior to church on sundays. Pt typically likes to cook but increasing difficulty            OT Problem List: Decreased strength;Impaired balance (sitting and/or standing);Decreased activity tolerance;Decreased cognition;Decreased safety awareness;Decreased knowledge of use of DME or AE;Pain;Obesity      OT Treatment/Interventions: Self-care/ADL training;Therapeutic exercise;Energy conservation;DME and/or AE instruction;Therapeutic activities;Patient/family education;Balance training    OT Goals(Current goals can be found in the care plan section) Acute Rehab OT Goals Patient Stated Goal: pt would like to be able to move around better, be able to cook again. husband would like for pt to go to rehab to progress functional abilities OT Goal Formulation: With patient/family Time For Goal Achievement: 09/18/20 Potential to Achieve Goals: Good ADL Goals Pt Will  Perform Grooming: with set-up;sitting Pt Will Perform Upper Body Dressing: with set-up;sitting Pt Will Perform Lower Body Dressing: with mod assist;sitting/lateral leans;sit to/from stand Pt Will Transfer to Toilet: with mod assist;stand pivot transfer;bedside commode Additional ADL Goal #1: Pt to complete bed mobility at Min A in prep for ADL transfers  OT Frequency: Min 2X/week   Barriers to D/C:            Co-evaluation              AM-PAC OT "6 Clicks" Daily Activity     Outcome Measure Help  from another person eating meals?: A Little Help from another person taking care of personal grooming?: A Little Help from another person toileting, which includes using toliet, bedpan, or urinal?: Total Help from another person bathing (including washing, rinsing, drying)?: Total Help from another person to put on and taking off regular upper body clothing?: A Lot Help from another person to put on and taking off regular lower body clothing?: Total 6 Click Score: 11   End of Session Nurse Communication: Mobility status  Activity Tolerance: Patient limited by pain;Patient limited by lethargy Patient left: in bed;with call bell/phone within reach;with bed alarm set;with family/visitor present  OT Visit Diagnosis: Unsteadiness on feet (R26.81);Other abnormalities of gait and mobility (R26.89);Muscle weakness (generalized) (M62.81);Other symptoms and signs involving cognitive function;Pain Pain - part of body:  (abdomen)                Time: 9201-0071 OT Time Calculation (min): 33 min Charges:  OT General Charges $OT Visit: 1 Visit OT Evaluation $OT Eval Moderate Complexity: 1 Mod OT Treatments $Self Care/Home Management : 8-22 mins  Malachy Chamber, OTR/L Acute Rehab Services Office: [phone]  Layla Maw 09/04/2020, 8:54 AM

## 2020-09-05 DIAGNOSIS — E861 Hypovolemia: Secondary | ICD-10-CM | POA: Diagnosis not present

## 2020-09-05 DIAGNOSIS — I9589 Other hypotension: Secondary | ICD-10-CM | POA: Diagnosis not present

## 2020-09-05 DIAGNOSIS — K625 Hemorrhage of anus and rectum: Secondary | ICD-10-CM | POA: Diagnosis not present

## 2020-09-05 LAB — GLUCOSE, CAPILLARY
Glucose-Capillary: 153 mg/dL — ABNORMAL HIGH (ref 70–99)
Glucose-Capillary: 154 mg/dL — ABNORMAL HIGH (ref 70–99)
Glucose-Capillary: 159 mg/dL — ABNORMAL HIGH (ref 70–99)
Glucose-Capillary: 174 mg/dL — ABNORMAL HIGH (ref 70–99)
Glucose-Capillary: 175 mg/dL — ABNORMAL HIGH (ref 70–99)
Glucose-Capillary: 212 mg/dL — ABNORMAL HIGH (ref 70–99)

## 2020-09-05 LAB — BASIC METABOLIC PANEL
Anion gap: 7 (ref 5–15)
BUN: <5 mg/dL — ABNORMAL LOW (ref 6–20)
CO2: 25 mmol/L (ref 22–32)
Calcium: 7.9 mg/dL — ABNORMAL LOW (ref 8.9–10.3)
Chloride: 102 mmol/L (ref 98–111)
Creatinine, Ser: 0.52 mg/dL (ref 0.44–1.00)
GFR, Estimated: >60 mL/min (ref >60)
Glucose, Bld: 154 mg/dL — ABNORMAL HIGH (ref 70–99)
Potassium: 3.2 mmol/L — ABNORMAL LOW (ref 3.5–5.1)
Sodium: 134 mmol/L — ABNORMAL LOW (ref 135–145)

## 2020-09-05 LAB — CBC WITH DIFFERENTIAL/PLATELET
Abs Immature Granulocytes: 0.41 10*3/uL — ABNORMAL HIGH (ref 0.00–0.07)
Basophils Absolute: 0 10*3/uL (ref 0.0–0.1)
Basophils Relative: 0 %
Eosinophils Absolute: 0 10*3/uL (ref 0.0–0.5)
Eosinophils Relative: 0 %
HCT: 25.7 % — ABNORMAL LOW (ref 36.0–46.0)
Hemoglobin: 8.2 g/dL — ABNORMAL LOW (ref 12.0–15.0)
Immature Granulocytes: 5 %
Lymphocytes Relative: 4 %
Lymphs Abs: 0.3 10*3/uL — ABNORMAL LOW (ref 0.7–4.0)
MCH: 30.3 pg (ref 26.0–34.0)
MCHC: 31.9 g/dL (ref 30.0–36.0)
MCV: 94.8 fL (ref 80.0–100.0)
Monocytes Absolute: 0.3 10*3/uL (ref 0.1–1.0)
Monocytes Relative: 4 %
Neutro Abs: 7.1 10*3/uL (ref 1.7–7.7)
Neutrophils Relative %: 87 %
Platelets: 101 10*3/uL — ABNORMAL LOW (ref 150–400)
RBC: 2.71 MIL/uL — ABNORMAL LOW (ref 3.87–5.11)
RDW: 17.2 % — ABNORMAL HIGH (ref 11.5–15.5)
WBC: 8.1 10*3/uL (ref 4.0–10.5)
nRBC: 0.4 % — ABNORMAL HIGH (ref 0.0–0.2)

## 2020-09-05 LAB — MAGNESIUM: Magnesium: 1.9 mg/dL (ref 1.7–2.4)

## 2020-09-05 MED ORDER — DIPHENHYDRAMINE HCL 50 MG/ML IJ SOLN
25.0000 mg | Freq: Once | INTRAMUSCULAR | Status: AC
  Administered 2020-09-05: 25 mg via INTRAVENOUS
  Filled 2020-09-05: qty 1

## 2020-09-05 MED ORDER — POTASSIUM CHLORIDE CRYS ER 20 MEQ PO TBCR
20.0000 meq | EXTENDED_RELEASE_TABLET | Freq: Two times a day (BID) | ORAL | Status: AC
  Administered 2020-09-05 – 2020-09-06 (×4): 20 meq via ORAL
  Filled 2020-09-05 (×4): qty 1

## 2020-09-05 MED ORDER — POTASSIUM CHLORIDE 10 MEQ/100ML IV SOLN
10.0000 meq | INTRAVENOUS | Status: AC
  Administered 2020-09-05 (×4): 10 meq via INTRAVENOUS
  Filled 2020-09-05: qty 100

## 2020-09-05 NOTE — Progress Notes (Signed)
PROGRESS NOTE    Rhonda [name]  ATF:[id number] DOB: 09/20/1960 DOA: 08/29/2020 PCP: [place].    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   Significant events: 3/8, admission with rectal bleeding, EGD and colonoscopy negative.   4/4-4/6 admitted with hematochezia, symptomatically treated, capsule endoscopy and discharged home. 4/13 presented with hematochezia, CT angiogram with acute bleeding in the descending colon status post coil embolization of the distal [place] branch of the superior sigmoidal artery. Insulin infusion for high blood sugars.  Admitted to ICU. 4/15, transferred out of ICU.  4/16, afternoon after eating lunch, patient had severe abdominal pain.  Relieved with pain medication.  A CT scan showed visceral perforation.  Underwent urgent exploratory laparotomy and found to have necrotic sigmoid colon.  Status post Jeanette Caprice procedure with colostomy and wound VAC for midline incision.   Assessment & Plan:   Active Problems:   GI bleed   Hypotension   Rectal bleeding   AKI (acute kidney injury) ([place])  Perforated sigmoid colon with history of diverticulosis and multiple procedures.  IMA embolization 4/13. Emergency exploratory laparotomy and Hartman's procedure 4/16. Postop management as per surgery. Chest physiotherapy and incentive spirometry, adequate pain management, using IV Dilaudid.  Patient has decreased mobility at baseline.  Will need skilled care on discharge. N.p.o. with ice chips. oral medications with sips. Peritoneal cultures negative so far.Remains on ciprofloxacin and Flagyl due to feculent peritonitis. Waiting for return of bowel  function. Surgery planning TPN if no adequate return of bowel functions.  Acute lower GI bleeding, diverticular bleeding.  Hemorrhagic shock secondary to acute GI bleeding and anemia of blood loss: Known diverticular disease. status post coil embolization 4/13.  Hemoglobin dropped since admission but is stable and not needing for any more transfusion.    Hopefully her disease part of the sigmoid colon is removed and will stop bleeding further.    Acute renal failure: Due to #1.  Improved and normalized.    Type 2 diabetes with hyperglycemia: N.p.o. now.  On IV fluids.  Keeping on lower doses of long-acting insulin.  Sugars acceptable.  Neurosarcoidosis/chronic debility and wheelchair-bound status:  Patient on prednisone 40 mg twice a day at home.  Given Solu-Medrol perioperative.  Now on oral prednisone.  On gabapentin.  If recurrent vomiting, will change to IV.  Obstructive sleep apnea: Declined CPAP.  Not using.  Hypertension: On metoprolol.  Can take by mouth.  Hypochloremic hyponatremia: Normalizing.  Hypomagnesemia/hypokalemia: Replace and monitor levels.  DVT prophylaxis: SCDs Start: 08/29/20 2256   Code Status: Full code Family Communication: Husband and daughter at the bedside. Disposition Plan: Status is: Inpatient  Remains inpatient appropriate because:Inpatient level of care appropriate due to severity of illness   Dispo: The patient is from: Home              Anticipated d/c is to: Skilled nursing facility.              Patient currently is not medically stable to d/c.   Difficult to place patient no         Consultants:   PCCM  Gastroenterology  Interventional radiology  Surgery.  Procedures:   Embolization inferior mesenteric artery 4/13  Hartman's procedure 4/16.  Antimicrobials:  Antibiotics Given (last 72 hours)  Date/Time Action Medication Dose Rate   09/02/20 2015 New Bag/Given   metroNIDAZOLE (FLAGYL) IVPB 500 mg 500 mg 100 mL/hr    09/02/20 2142 New Bag/Given   ciprofloxacin (CIPRO) IVPB 400 mg 400 mg 200 mL/hr   09/03/20 0322 New Bag/Given   metroNIDAZOLE (FLAGYL) IVPB 500 mg 500 mg 100 mL/hr   09/03/20 0649 New Bag/Given   ciprofloxacin (CIPRO) IVPB 400 mg 400 mg 200 mL/hr   09/03/20 1238 New Bag/Given   metroNIDAZOLE (FLAGYL) IVPB 500 mg 500 mg 100 mL/hr   09/03/20 1954 New Bag/Given   metroNIDAZOLE (FLAGYL) IVPB 500 mg 500 mg 100 mL/hr   09/03/20 2100 New Bag/Given   ciprofloxacin (CIPRO) IVPB 400 mg 400 mg 200 mL/hr   09/04/20 0308 New Bag/Given   metroNIDAZOLE (FLAGYL) IVPB 500 mg 500 mg 100 mL/hr   09/04/20 8119 New Bag/Given   ciprofloxacin (CIPRO) IVPB 400 mg 400 mg 200 mL/hr   09/04/20 1143 New Bag/Given   metroNIDAZOLE (FLAGYL) IVPB 500 mg 500 mg 100 mL/hr   09/04/20 1746 New Bag/Given   ciprofloxacin (CIPRO) IVPB 400 mg 400 mg 200 mL/hr   09/04/20 1748 New Bag/Given   metroNIDAZOLE (FLAGYL) IVPB 500 mg 500 mg 100 mL/hr   09/05/20 1478 New Bag/Given   metroNIDAZOLE (FLAGYL) IVPB 500 mg 500 mg 100 mL/hr   09/05/20 0537 New Bag/Given   ciprofloxacin (CIPRO) IVPB 400 mg 400 mg 200 mL/hr   09/05/20 1228 New Bag/Given   metroNIDAZOLE (FLAGYL) IVPB 500 mg 500 mg 100 mL/hr       Subjective: Patient seen and examined.  Husband and daughter at the bedside.  Patient herself denies any complaints.  She had some nausea overnight.  Denies any pain. She was forgetful of having colostomy bag in her belly.   Objective: Vitals:   09/04/20 2339 09/05/20 0328 09/05/20 0736 09/05/20 1216  BP: (!) 146/83 (!) 153/89 (!) 158/74 (!) 158/96  Pulse: 96 99 (!) 108 (!) 102  Resp: 18 18 20 20   Temp: 98.8 F (37.1 C) 99.1 F (37.3 C) 98.1 F (36.7 C) 98.2 F (36.8 C)  TempSrc: Oral Oral Oral Oral  SpO2: 100% 99% 100% 99%  Weight:      Height:        Intake/Output Summary (Last 24 hours) at 09/05/2020 1344 Last data filed at 09/05/2020 1132 Gross per 24 hour  Intake 3243.71 ml  Output 3450 ml  Net  -206.29 ml   Filed Weights   08/30/20 0022 08/30/20 0500 09/01/20 0448  Weight: 79.5 kg 79.5 kg 83.1 kg    Examination: General: Frail and debilitated.  Chronically sick looking.  Not in any distress.  On room air. Patient has cushingoid face. Cardiovascular: S1-S2 normal.  No added sounds. Respiratory: Bilateral clear.  No added sounds. Gastrointestinal: Midline surgical incision clean and dry with wound VAC in place. Patient has left lower quadrant colostomy bag that is empty. Bowel sounds present and sluggish. Ext: No swelling or edema.  No cyanosis.    Data Reviewed: I have personally reviewed following labs and imaging studies  CBC: Recent Labs  Lab 09/01/20 1422 09/02/20 0631 09/03/20 0146 09/04/20 0137 09/05/20 0111  WBC 17.1* 7.0 11.0* 10.1 8.1  NEUTROABS 15.1* 6.3 10.1* 9.4* 7.1  HGB 11.8* 12.5 10.5* 8.9* 8.2*  HCT 34.7* 37.9 32.0* 27.1* 25.7*  MCV 90.1 92.9 94.1 91.2 94.8  PLT 122* 114* 101* 103* 295*   Basic Metabolic Panel: Recent Labs  Lab 08/30/20 0600 08/31/20 0048 08/31/20 1614  09/01/20 0920 09/01/20 1422 09/02/20 0631 09/03/20 0146 09/04/20 0137 09/05/20 0111  NA 136 124*   < >  --  125* 128* 127* 129* 134*  K 4.1 4.9   < >  --  3.8 3.8 4.1 3.6 3.2*  CL 99 89*   < >  --  86* 91* 94* 97* 102  CO2 31 30   < >  --  26 25 26 26 25   GLUCOSE 146* 280*   < >  --  173* 207* 161* 173* 154*  BUN 11 9   < >  --  14 15 16 7  <5*  CREATININE 0.82 0.62   < >  --  0.59 0.70 0.66 0.53 0.52  CALCIUM 8.4* 8.3*   < >  --  8.5* 8.3* 8.0* 8.0* 7.9*  MG 1.7 2.0  --  1.8  --   --  1.6* 2.1 1.9  PHOS 2.1* 4.0  --  3.8  --   --  3.5  --   --    < > = values in this interval not displayed.   GFR: Estimated Creatinine Clearance: 77.3 mL/min (by C-G formula based on SCr of 0.52 mg/dL). Liver Function Tests: Recent Labs  Lab 08/29/20 2003  AST 37  ALT <5  ALKPHOS 137*  BILITOT 0.7  PROT 4.5*  ALBUMIN 2.6*   No results for input(s): LIPASE, AMYLASE in the  last 168 hours. No results for input(s): AMMONIA in the last 168 hours. Coagulation Profile: Recent Labs  Lab 08/29/20 2003  INR 1.1   Cardiac Enzymes: No results for input(s): CKTOTAL, CKMB, CKMBINDEX, TROPONINI in the last 168 hours. BNP (last 3 results) No results for input(s): PROBNP in the last 8760 hours. HbA1C: No results for input(s): HGBA1C in the last 72 hours. CBG: Recent Labs  Lab 09/04/20 1934 09/04/20 2341 09/05/20 0331 09/05/20 0814 09/05/20 1217  GLUCAP 186* 173* 159* 175* 154*   Lipid Profile: No results for input(s): CHOL, HDL, LDLCALC, TRIG, CHOLHDL, LDLDIRECT in the last 72 hours. Thyroid Function Tests: No results for input(s): TSH, T4TOTAL, FREET4, T3FREE, THYROIDAB in the last 72 hours. Anemia Panel: No results for input(s): VITAMINB12, FOLATE, FERRITIN, TIBC, IRON, RETICCTPCT in the last 72 hours. Sepsis Labs: No results for input(s): PROCALCITON, LATICACIDVEN in the last 168 hours.  Recent Results (from the past 240 hour(s))  Resp Panel by RT-PCR (Flu A&B, Covid) Nasopharyngeal Swab     Status: None   Collection Time: 08/29/20  8:30 PM   Specimen: Nasopharyngeal Swab; Nasopharyngeal(NP) swabs in vial transport medium  Result Value Ref Range Status   SARS Coronavirus 2 by RT PCR NEGATIVE NEGATIVE Final    Comment: (NOTE) SARS-CoV-2 target nucleic acids are NOT DETECTED.  The SARS-CoV-2 RNA is generally detectable in upper respiratory specimens during the acute phase of infection. The lowest concentration of SARS-CoV-2 viral copies this assay can detect is 138 copies/mL. A negative result does not preclude SARS-Cov-2 infection and should not be used as the sole basis for treatment or other patient management decisions. A negative result may occur with  improper specimen collection/handling, submission of specimen other than nasopharyngeal swab, presence of viral mutation(s) within the areas targeted by this assay, and inadequate number of  viral copies(<138 copies/mL). A negative result must be combined with clinical observations, patient history, and epidemiological information. The expected result is Negative.  Fact Sheet for Patients:  EntrepreneurPulse.com.au  Fact Sheet for Healthcare Providers:  IncredibleEmployment.be  This test is no  t yet approved or cleared by the [place] and  has been authorized for detection and/or diagnosis of SARS-CoV-2 by FDA under an Emergency Use Authorization (EUA). This EUA will remain  in effect (meaning this test can be used) for the duration of the COVID-19 declaration under Section 564(b)(1) of the Act, 21 U.S.C.section 360bbb-3(b)(1), unless the authorization is terminated  or revoked sooner.       Influenza A by PCR NEGATIVE NEGATIVE Final   Influenza B by PCR NEGATIVE NEGATIVE Final    Comment: (NOTE) The Xpert Xpress SARS-CoV-2/FLU/RSV plus assay is intended as an aid in the diagnosis of influenza from Nasopharyngeal swab specimens and should not be used as a sole basis for treatment. Nasal washings and aspirates are unacceptable for Xpert Xpress SARS-CoV-2/FLU/RSV testing.  Fact Sheet for Patients: EntrepreneurPulse.com.au  Fact Sheet for Healthcare Providers: IncredibleEmployment.be  This test is not yet approved or cleared by the [place] FDA and has been authorized for detection and/or diagnosis of SARS-CoV-2 by FDA under an Emergency Use Authorization (EUA). This EUA will remain in effect (meaning this test can be used) for the duration of the COVID-19 declaration under Section 564(b)(1) of the Act, 21 U.S.C. section 360bbb-3(b)(1), unless the authorization is terminated or revoked.  Performed at [facility], [place] [address], [place], [place] [address]   MRSA PCR Screening     Status: Abnormal   Collection Time: 08/30/20 12:41 AM   Specimen: Nasopharyngeal  Result  Value Ref Range Status   MRSA by PCR POSITIVE (A) NEGATIVE Final    Comment:        The GeneXpert MRSA Assay (FDA approved for NASAL specimens only), is one component of a comprehensive MRSA colonization surveillance program. It is not intended to diagnose MRSA infection nor to guide or monitor treatment for MRSA infections. RESULT CALLED TO, READ BACK BY AND VERIFIED WITH: Almyra Free RN 08/30/20 0503 JDW Performed at [facility], [address], [place], [place] [address]   Aerobic/Anaerobic Culture w Gram Stain (surgical/deep wound)     Status: None (Preliminary result)   Collection Time: 09/01/20 11:22 PM   Specimen: PATH Cytology Peritoneal fluid; Body Fluid  Result Value Ref Range Status   Specimen Description PERITONEAL FLUID  Final   Special Requests IN SWABS PATIENT ON FOLLOWING CIPRO,FLAGYL  Final   Gram Stain   Final    RARE WBC PRESENT, PREDOMINANTLY PMN NO ORGANISMS SEEN    Culture   Final    NO GROWTH 3 DAYS NO ANAEROBES ISOLATED; CULTURE IN PROGRESS FOR 5 DAYS Performed at [facility], [address] [place], [place] [address]    Report Status PENDING  Incomplete         Radiology Studies: No results found.      Scheduled Meds: . gabapentin  100 mg Oral TID  . insulin aspart  0-15 Units Subcutaneous Q4H  . insulin glargine  8 Units Subcutaneous QHS  . lidocaine  1 patch Transdermal Q24H  . mouth rinse  15 mL Mouth Rinse BID  . metoprolol tartrate  25 mg Oral BID  . pantoprazole (PROTONIX) IV  40 mg Intravenous Q24H  . potassium chloride  20 mEq Oral BID  . predniSONE  40 mg Oral BID   Continuous Infusions: . sodium chloride 125 mL/hr at 09/05/20 0314  . ciprofloxacin 400 mg (09/05/20 0537)  . metronidazole 500 mg (09/05/20 1228)     LOS: 7 days  Time spent: 30 minutes    Barb Merino, MD [facility] Pager [phone]

## 2020-09-05 NOTE — TOC Initial Note (Signed)
Transition of Care [name]) - Initial/Assessment Note    Patient Details  Name: Rhonda [name] MRN: [id number] Date of Birth: 07/25/1960  Transition of [facility]) CM/SW Contact:    Benard Halsted, LCSW Phone Number: 09/05/2020, 12:26 PM  Clinical Narrative:                 CSW received consult for possible SNF placement at time of discharge. CSW spoke with patient and her spouse and daughter at bedside. Patient reported that patient's spouse is currently unable to care for patient at their home given patient's current physical needs and fall risk. Patient expressed understanding of PT recommendation and is agreeable to SNF placement at time of discharge. CSW discussed insurance authorization process and provided Medicare SNF ratings list for in-network facilities for [facility]. Family has decided on [facility] in [place]. CSW confirmed with Hilda Blades at [facility] that they will start insurance authorization tomorrow in case it takes a few days to receive. Patient has received the COVID vaccines but no booster. Patient expressed being hopeful for rehab and to feel better soon. No further questions reported at this time.    Expected Discharge Plan: Skilled Nursing Facility Barriers to Discharge: Insurance Authorization,Continued Medical Work up   Patient Goals and CMS Choice Patient states their goals for this hospitalization and ongoing recovery are:: Rehab CMS Medicare.gov Compare Post Acute Care list provided to:: Patient Choice offered to / list presented to : [place]  Expected Discharge Plan and Services Expected Discharge Plan: [place] In-house Referral: Clinical Social Work   Post Acute Care Choice: [place] Living arrangements for the past 2 months: [place]                                      Prior Living Arrangements/Services Living arrangements for the past 2 months: Single Family Home Lives  with:: Spouse Patient language and need for interpreter reviewed:: Yes Do you feel safe going back to the place where you live?: Yes      Need for Family Participation in Patient Care: Yes (Comment) Care giver support system in place?: Yes (comment) Current home services: DME (walker, wheelchair, and bedside commode) Criminal Activity/Legal Involvement Pertinent to Current Situation/Hospitalization: No - Comment as needed  Activities of Daily Living      Permission Sought/Granted Permission sought to share information with : Facility Contact Representative,Family Supports Permission granted to share information with : Yes, Verbal Permission Granted  Share Information with NAME: [name]  Permission granted to share info w AGENCY: SNFs  Permission granted to share info w Relationship: Spouse  Permission granted to share info w Contact Information: [phone]  Emotional Assessment Appearance:: Appears older than stated age Attitude/Demeanor/Rapport: Engaged Affect (typically observed): Accepting,Appropriate Orientation: : Oriented to Self,Oriented to Place,Oriented to  Time,Oriented to Situation Alcohol / Substance Use: Not Applicable Psych Involvement: No (comment)  Admission diagnosis:  Rectal bleeding [K62.5] GI bleed [K92.2] Acute GI bleeding [K92.2] Hypotension, unspecified hypotension type [I95.9] Patient Active Problem List   Diagnosis Date Noted  . Hypotension   . Rectal bleeding   . AKI (acute kidney injury) ([place])   . GI bleed 08/29/2020  . 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 High risk medication use 08/06/2020  . Weakness 08/06/2020  . Type 2 diabetes mellitus without complication, with long-term current use of insulin ([place]) 08/06/2020  . Acute lower GI bleeding  07/23/2020  . Hyperosmolar hyperglycemic state (HHS) ([place]) 07/23/2020  . Diverticulitis of large intestine with bleeding 07/23/2020  . Liver lesion 07/23/2020  . 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 HLD (hyperlipidemia)  01/12/2020  . Acute lower UTI 01/12/2020  . AMS (altered mental status) 01/11/2020  . Memory changes 01/08/2020  . Altered mental status   . Hyponatremia   . Speech disturbance   . Demyelinating changes in brain ([place]) 11/13/2019  . Left pontine stroke ([place]) 05/23/2019  . Transaminitis 05/23/2019  . Nausea and vomiting 05/23/2019  . Leukocytosis 05/23/2019  . Thoracic lymphadenopathy 08/26/2018  . Axillary lymphadenopathy 08/26/2018  . Piriformis syndrome of right side 09/29/2016  . OSA (obstructive sleep apnea) 09/27/2015  . Right hip pain 04/11/2015  . Essential hypertension 11/07/2014  . Chronic low back pain 11/07/2014  . Prediabetes 11/07/2014  . Vitamin D deficiency 11/07/2014   PCP:  [place]:   [facility] [place], [address] [place] [address] Phone: [phone] Fax: [phone]  [facility] [address] [place] [address] Phone: [phone] Fax: [phone]     Social Determinants of Health (SDOH) Interventions    Readmission Risk Interventions Readmission Risk Prevention Plan 09/04/2020  Transportation Screening Complete  Medication Review (RN Care Manager) Referral to Pharmacy  PCP or Specialist appointment within 3-5 days of discharge Complete  HRI or Home Care Consult Complete  SW Recovery Care/Counseling Consult Complete  Palliative Care Screening Not [place] Complete  Some recent data might be hidden

## 2020-09-05 NOTE — Consult Note (Signed)
[place] Nurse wound follow up Patient receiving care in [phone]. Patient's spouse and daughter, Estill Bamberg, at bedside with myself and surgical PA M. Maczis. Of note, upon entry into room, patient and linens were saturated with urine. NT, Megan Salon, to room and assisted with complete linen change. Wound type: abdominal surgical wound Measurement: deferred Wound bed: pink, clean Drainage (amount, consistency, odor) serosanginous in canister Periwound: intact Dressing procedure/placement/frequency: one piece of black foam removed from wound bed, two pieces cut to fit placed into wound, drape applied, immediate seal obtained. Patient tolerated well.  [place] Nurse ostomy follow up Stoma type/location: LUQ colostomy Stomal assessment/size: flat, red, moist, sutures intact Peristomal assessment: intact Treatment options for stomal/peristomal skin: barrier ring and convex pouch Output: none  Ostomy pouching: 1pc. Flexible convex [name] [phone] Education provided: Spouse demonstrated to Estill Bamberg how to use his finger to adhere the pouch to the barrier ring.  Both observed a complete pouch change, cutting of opening on a new pouch, use of the barrier ring.  Spouse explained to [place] and demonstrated how to open/close a pouch, and explained how to empty one.  Estill Bamberg is a Chief Executive Officer and very familiar with ostomy care. Enrolled patient in [place] Discharge program: Yes Val Riles, RN, MSN, [facility], CNS-BC, pager [phone]

## 2020-09-05 NOTE — Progress Notes (Signed)
4 Days Post-Op  Subjective: CC: Patients bed was saturated with urine when I entered the room. Called NT for assistance. The NT, WOCN and myself changed the sheets.   Patient reports she has no abdominal pain. She did have some nausea overnight and had some episodes of spit up per the husband. No current nausea. She has had no air or stool out from her colostomy. Tried to get to EOB yesterday with OT.  Objective: Vital signs in last 24 hours: Temp:  [98.1 F (36.7 C)-99.1 F (37.3 C)] 98.1 F (36.7 C) (04/20 0736) Pulse Rate:  [96-108] 108 (04/20 0736) Resp:  [14-20] 20 (04/20 0736) BP: (145-158)/(74-97) 158/74 (04/20 0736) SpO2:  [99 %-100 %] 100 % (04/20 0736) Last BM Date: 09/01/20  Intake/Output from previous day: 04/19 0701 - 04/20 0700 In: 3243.7 [I.V.:2472.7; IV Piggyback:771] Out: 1443 [Urine:1550] Intake/Output this shift: Total I/O In: 0  Out: 1000 [Urine:1000]  PE: Gen: Alert, NAD, pleasant Pulm:Normal rate and effort Abd: Soft,mild distension, mild generalized tenderness, hypoactive bowel sounds. Colostomy in place w/ scant sweat in bag. No air. Stoma red slightly budded and viable. Midline wound with mix of fatty tissue and granulation tissue at wound edges and base. No evidence of evisceration or dehiscence. No surrounding cellulitis. Vac was reapplied.        Lab Results:  Recent Labs    09/04/20 0137 09/05/20 0111  WBC 10.1 8.1  HGB 8.9* 8.2*  HCT 27.1* 25.7*  PLT 103* 101*   BMET Recent Labs    09/04/20 0137 09/05/20 0111  NA 129* 134*  K 3.6 3.2*  CL 97* 102  CO2 26 25  GLUCOSE 173* 154*  BUN 7 <5*  CREATININE 0.53 0.52  CALCIUM 8.0* 7.9*   PT/INR No results for input(s): LABPROT, INR in the last 72 hours. CMP     Component Value Date/Time   NA 134 (L) 09/05/2020 0111   NA 129 (L) 08/06/2020 1003   K 3.2 (L) 09/05/2020 0111   CL 102 09/05/2020 0111   CO2 25 09/05/2020 0111   GLUCOSE 154 (H) 09/05/2020 0111   BUN <5  (L) 09/05/2020 0111   BUN 6 08/06/2020 1003   CREATININE 0.52 09/05/2020 0111   CREATININE 0.85 08/02/2015 1710   CALCIUM 7.9 (L) 09/05/2020 0111   PROT 4.5 (L) 08/29/2020 2003   PROT 4.9 (L) 08/06/2020 1003   ALBUMIN 2.6 (L) 08/29/2020 2003   ALBUMIN 3.2 (L) 08/06/2020 1003   AST 37 08/29/2020 2003   ALT <5 08/29/2020 2003   ALKPHOS 137 (H) 08/29/2020 2003   BILITOT 0.7 08/29/2020 2003   BILITOT 0.4 08/06/2020 1003   GFRNONAA >60 09/05/2020 0111   GFRNONAA 78 08/02/2015 1710   GFRAA >60 01/17/2020 0546   GFRAA >89 08/02/2015 1710   Lipase     Component Value Date/Time   LIPASE 64 (H) 07/23/2020 0432       Studies/Results: No results found.  Anti-infectives: Anti-infectives (From admission, onward)   Start     Dose/Rate Route Frequency Ordered Stop   09/01/20 1930  ciprofloxacin (CIPRO) IVPB 400 mg        400 mg 200 mL/hr over 60 Minutes Intravenous Every 12 hours 09/01/20 1836 09/05/20 2359   09/01/20 1930  metroNIDAZOLE (FLAGYL) IVPB 500 mg        500 mg 100 mL/hr over 60 Minutes Intravenous Every 8 hours 09/01/20 1836 09/05/20 2359       Assessment/Plan Hx of HTN DM2  Neurosarcoidosis/chronic debility and wheelchair-bound status- on chronic steroids.  OSA ABL anemia - hgb 8.2 and stable   Ms. Sprunger is a 60 year old female with a history of GI bleed s/p angioembolization of a branch of the IMA on 4/13, who developed a necrotic section of proximal sigmoid colon with perforation and feculent peritonitis - S/pExploratory Laparotomy, Partial Colectomy (sigmoid), colostomy creation,Hartman's procedure on 4/16/2022by Dr. Grandville Silos - POD #4 - Ileus post-op. Keep NPO and await return in bowel function. If has further nausea or emesis would recommend replacing NGT and checking abdominal xray.  - Check pre-albumin in AM. Consider starting TPN tomorrow -Continue Cipro/flagyl for at least 4 days post op due to feculent peritonitis - Continue PT/OT, mobilize -  recommending SNF - WOC following for ostomy education - Vac changes MWF - Pulm toilet   FEN -NPO, IVF per TRH VTE -SCDs, okay for chemical prophylaxis from a general surgery standpoint ID -Cipro/Flagyl 4/16>>    LOS: 7 days      Jillyn Ledger , [facility] 09/05/2020, 9:59 AM Please see Amion for pager number during day hours 7:00am-4:30pm

## 2020-09-06 ENCOUNTER — Inpatient Hospital Stay: Payer: Self-pay

## 2020-09-06 DIAGNOSIS — E861 Hypovolemia: Secondary | ICD-10-CM | POA: Diagnosis not present

## 2020-09-06 DIAGNOSIS — K625 Hemorrhage of anus and rectum: Secondary | ICD-10-CM | POA: Diagnosis not present

## 2020-09-06 DIAGNOSIS — I9589 Other hypotension: Secondary | ICD-10-CM | POA: Diagnosis not present

## 2020-09-06 LAB — GLUCOSE, CAPILLARY
Glucose-Capillary: 133 mg/dL — ABNORMAL HIGH (ref 70–99)
Glucose-Capillary: 137 mg/dL — ABNORMAL HIGH (ref 70–99)
Glucose-Capillary: 124 mg/dL — ABNORMAL HIGH (ref 70–99)
Glucose-Capillary: 136 mg/dL — ABNORMAL HIGH (ref 70–99)
Glucose-Capillary: 204 mg/dL — ABNORMAL HIGH (ref 70–99)
Glucose-Capillary: 285 mg/dL — ABNORMAL HIGH (ref 70–99)

## 2020-09-06 LAB — CBC WITH DIFFERENTIAL/PLATELET
Abs Immature Granulocytes: 0.38 10*3/uL — ABNORMAL HIGH (ref 0.00–0.07)
Basophils Absolute: 0 10*3/uL (ref 0.0–0.1)
Basophils Relative: 0 %
Eosinophils Absolute: 0 10*3/uL (ref 0.0–0.5)
Eosinophils Relative: 0 %
HCT: 29.6 % — ABNORMAL LOW (ref 36.0–46.0)
Hemoglobin: 9.4 g/dL — ABNORMAL LOW (ref 12.0–15.0)
Immature Granulocytes: 6 %
Lymphocytes Relative: 4 %
Lymphs Abs: 0.3 10*3/uL — ABNORMAL LOW (ref 0.7–4.0)
MCH: 30 pg (ref 26.0–34.0)
MCHC: 31.8 g/dL (ref 30.0–36.0)
MCV: 94.6 fL (ref 80.0–100.0)
Monocytes Absolute: 0.3 10*3/uL (ref 0.1–1.0)
Monocytes Relative: 5 %
Neutro Abs: 5.6 10*3/uL (ref 1.7–7.7)
Neutrophils Relative %: 85 %
Platelets: 114 10*3/uL — ABNORMAL LOW (ref 150–400)
RBC: 3.13 MIL/uL — ABNORMAL LOW (ref 3.87–5.11)
RDW: 17.5 % — ABNORMAL HIGH (ref 11.5–15.5)
WBC: 6.5 10*3/uL (ref 4.0–10.5)
nRBC: 0.6 % — ABNORMAL HIGH (ref 0.0–0.2)

## 2020-09-06 LAB — MAGNESIUM: Magnesium: 1.9 mg/dL (ref 1.7–2.4)

## 2020-09-06 LAB — PREALBUMIN: Prealbumin: 15.2 mg/dL — ABNORMAL LOW (ref 18–38)

## 2020-09-06 MED ORDER — SODIUM CHLORIDE 0.9 % IV SOLN: INTRAVENOUS | Status: AC

## 2020-09-06 MED ORDER — CHLORHEXIDINE GLUCONATE CLOTH 2 % EX PADS
6.0000 | MEDICATED_PAD | Freq: Every day | CUTANEOUS | Status: DC
  Administered 2020-09-06 – 2020-09-28 (×23): 6 via TOPICAL

## 2020-09-06 MED ORDER — TRAVASOL 10 % IV SOLN
INTRAVENOUS | Status: AC
  Filled 2020-09-06: qty 403.2

## 2020-09-06 MED ORDER — SODIUM CHLORIDE 0.9% FLUSH
10.0000 mL | INTRAVENOUS | Status: DC | PRN
  Administered 2020-09-15 – 2020-09-24 (×2): 10 mL

## 2020-09-06 MED ORDER — TRAZODONE HCL 50 MG PO TABS
50.0000 mg | ORAL_TABLET | Freq: Every evening | ORAL | Status: DC | PRN
  Administered 2020-09-06: 50 mg via ORAL
  Filled 2020-09-06: qty 1

## 2020-09-06 NOTE — Progress Notes (Signed)
5 Days Post-Op  Subjective: CC: Patient reports she is not having any abdominal pain this morning. She denies any n/v. She still has not had any air or stool out from her colostomy. She did not get out of bed yesterday. She is voiding without difficulty. Family is not in the room this morning. She denies any CP or SOB.   Objective: Vital signs in last 24 hours: Temp:  [97.7 F (36.5 C)-98.7 F (37.1 C)] 97.7 F (36.5 C) (04/21 0355) Pulse Rate:  [98-106] 98 (04/21 0355) Resp:  [18-20] 18 (04/21 0355) BP: (144-158)/(90-103) 144/99 (04/21 0355) SpO2:  [99 %-100 %] 100 % (04/21 0355) Weight:  [87.6 kg] 87.6 kg (04/21 0355) Last BM Date: 09/01/20  Intake/Output from previous day: 04/20 0701 - 04/21 0700 In: 2015.2 [I.V.:2015.2] Out: 6100 [Urine:6050; Drains:50] Intake/Output this shift: Total I/O In: -  Out: 700 [Urine:700]  PE: Gen: Alert, NAD, pleasant Heart: Reg rate (HR 90's) Pulm:Normal rate and effort Abd: Soft,mild distension,mildgeneralized tenderness that is stable from yesterday and appears appropriate in the post operative setting, hypoactive bowel sounds. Colostomy in place w/ no air or stool in bag. Stoma difficult to see through colostomy bag but the partially visualized portion appears red and viable. Midline wound vac in place Msk: Trace pedal edema b/l. SCD's in place  Lab Results:  Recent Labs    09/05/20 0111 09/06/20 0009  WBC 8.1 6.5  HGB 8.2* 9.4*  HCT 25.7* 29.6*  PLT 101* 114*   BMET Recent Labs    09/04/20 0137 09/05/20 0111  NA 129* 134*  K 3.6 3.2*  CL 97* 102  CO2 26 25  GLUCOSE 173* 154*  BUN 7 <5*  CREATININE 0.53 0.52  CALCIUM 8.0* 7.9*   PT/INR No results for input(s): LABPROT, INR in the last 72 hours. CMP     Component Value Date/Time   NA 134 (L) 09/05/2020 0111   NA 129 (L) 08/06/2020 1003   K 3.2 (L) 09/05/2020 0111   CL 102 09/05/2020 0111   CO2 25 09/05/2020 0111   GLUCOSE 154 (H) 09/05/2020 0111    BUN <5 (L) 09/05/2020 0111   BUN 6 08/06/2020 1003   CREATININE 0.52 09/05/2020 0111   CREATININE 0.85 08/02/2015 1710   CALCIUM 7.9 (L) 09/05/2020 0111   PROT 4.5 (L) 08/29/2020 2003   PROT 4.9 (L) 08/06/2020 1003   ALBUMIN 2.6 (L) 08/29/2020 2003   ALBUMIN 3.2 (L) 08/06/2020 1003   AST 37 08/29/2020 2003   ALT <5 08/29/2020 2003   ALKPHOS 137 (H) 08/29/2020 2003   BILITOT 0.7 08/29/2020 2003   BILITOT 0.4 08/06/2020 1003   GFRNONAA >60 09/05/2020 0111   GFRNONAA 78 08/02/2015 1710   GFRAA >60 01/17/2020 0546   GFRAA >89 08/02/2015 1710   Lipase     Component Value Date/Time   LIPASE 64 (H) 07/23/2020 0432       Studies/Results: No results found.  Anti-infectives: Anti-infectives (From admission, onward)   Start     Dose/Rate Route Frequency Ordered Stop   09/01/20 1930  ciprofloxacin (CIPRO) IVPB 400 mg        400 mg 200 mL/hr over 60 Minutes Intravenous Every 12 hours 09/01/20 1836 09/05/20 1927   09/01/20 1930  metroNIDAZOLE (FLAGYL) IVPB 500 mg        500 mg 100 mL/hr over 60 Minutes Intravenous Every 8 hours 09/01/20 1836 09/05/20 2124       Assessment/Plan Hx of HTN DM2 Neurosarcoidosis/chronic  debility and wheelchair-bound status- on chronic steroids.  OSA ABL anemia - hgb9.4 and stable   Rhonda [name] is a 60 year old female with a history of GI bleed s/p angioembolization of a branch of the IMA on 4/13, who developed a necrotic section of proximal sigmoid colon with perforation and feculent peritonitis - S/pExploratory Laparotomy, Partial Colectomy (sigmoid), colostomy creation,Hartman's procedure on 4/16/2022by Dr. Grandville Silos - POD #5 - Ileus post-op. Keep NPO. Start TPN - Completed 4 days post op abx due to feculent peritonitis. Afebrile and wbc 6.5 (on chronic steroids).  -Continue PT/OT, mobilize - recommending SNF - WOC following for ostomy education - Vac changes MWF - Pulm toilet   FEN -NPO, IVF per TRH, start TPN VTE -SCDs,okay for  chemical prophylaxis from a general surgery standpoint ID -Cipro/Flagyl4/16 - 4/20. None currently Foley - None    LOS: 8 days    Jillyn Ledger , [facility] 09/06/2020, 10:53 AM Please see Amion for pager number during day hours 7:00am-4:30pm

## 2020-09-06 NOTE — Progress Notes (Signed)
PHARMACY - TOTAL PARENTERAL NUTRITION CONSULT NOTE   Indication: Prolonged ileus  Patient Measurements: Height: 5\' 3"  (160 cm) Weight: 87.6 kg (193 lb 2 oz) IBW/kg (Calculated) : 52.4 TPN AdjBW (KG): 59.2 Body mass index is 34.21 kg/m.  Assessment: 1 YOF who presented with necrotic section of proximal sigmoid colon with perforation and feculent peritonitis now s/p ex lap with partial sigmoid colectomy and colostomy and Hartman's procedure on 09/01/20. Developed ileus post op and has remained NPO. Pharmacy consulted to start TPN.   Glucose / Insulin: A1c 11.8. CBGs controlled on mSSI and lantus 8 units daily. Has required 13 units of SSI over 24 hours  -on prednisone 40 mg BID  Electrolytes: Na 134, K 3.2 (on KCl BID), MG 1.9, CO2 25 Renal: SCr wnl, BUN < 5 Hepatic:  Intake / Output; MIVF: UOP 6L, -4L yest,  GI Imaging: 4/16: SBO with evidence of perforation and free fluid in the abdomen. GI Surgeries / Procedures:  4/16: ex lap with partial sigmoid colectomy and colostomy and Hartman's procedure  Central access: PICC line ordered for today  TPN start date: 09/06/20   Nutritional Goals Awaiting RD recommendations  1700 Kcal, 100 g protein   Current Nutrition:  NPO  Plan:  Start TPN at 47mL/hr at 1800 Electrolytes in TPN: Na 22mEq/L, K 73mEq/L, Ca 44mEq/L, Mg 79mEq/L, and Phos 20mmol/L. Cl:Ac 1:1 Add standard MVI and trace elements to TPN Continue moderate SSI with lantus 8 units QHS. Will likely need to increase insulin coverage once TPN started  Reduce MIVF to 85 mL/hr at 1800 Monitor TPN labs on Mon/Thurs  Albertina Parr, PharmD., BCPS, BCCCP Clinical Pharmacist Please refer to [facility] for unit-specific pharmacist

## 2020-09-06 NOTE — Progress Notes (Signed)
Peripherally Inserted Central Catheter Placement  The IV Nurse has discussed with the patient and/or persons authorized to consent for the patient, the purpose of this procedure and the potential benefits and risks involved with this procedure.  The benefits include less needle sticks, lab draws from the catheter, and the patient may be discharged home with the catheter. Risks include, but not limited to, infection, bleeding, blood clot (thrombus formation), and puncture of an artery; nerve damage and irregular heartbeat and possibility to perform a PICC exchange if needed/ordered by physician.  Alternatives to this procedure were also discussed.  Bard Power PICC patient education guide, fact sheet on infection prevention and patient information card has been provided to patient /or left at bedside.    PICC Placement Documentation  PICC Double Lumen 20/60/15 PICC Right Basilic 39 cm 1 cm (Active)  Indication for Insertion or Continuance of Line Administration of hyperosmolar/irritating solutions (i.e. TPN, Vancomycin, etc.) 09/06/20 1838  Exposed Catheter (cm) 0 cm 09/06/20 1838  Site Assessment Dry;Clean;Intact 09/06/20 1838  Lumen #1 Status Flushed;Blood return noted;Saline locked 09/06/20 1838  Lumen #2 Status Flushed;Blood return noted;Saline locked 09/06/20 1838  Dressing Type Transparent 09/06/20 1838  Dressing Status Clean;Dry;Intact 09/06/20 1838  Antimicrobial disc in place? Yes 09/06/20 1838  Dressing Change Due 09/13/20 09/06/20 1838       Scotty Court 09/06/2020, 6:42 PM

## 2020-09-06 NOTE — Progress Notes (Addendum)
Occupational Therapy Treatment Patient Details Name: Rhonda [name] MRN: [id number] DOB: 09-12-1960 Today's Date: 09/06/2020    History of present illness 60 yo female presented to [facility] on [facility]/20 with rectal bleeding (several recent admissions for similar). Pt admitted with hemorrhagic shock.  She is s/p IR for coil embolization of intraluminal extravasation for a distal arcade branch of the IMA on 08/29/20.  She developed abdominal pain and found to have SBO with bowel perforation s/p emergent exp lap, partial colectomy, colostomy, Hartman's and wound vac application 05/20/56.  Medical hx inlcudes DM2, seizures, HTN, DDD, recurrent GIB.  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   OT comments  Pt progressing towards established OT goals. Pt performing bed mobility with Mod-Max A +2. Pt participating in AROM for BLEs while seated at EOB demonstrating increased activity tolerance and sitting balance. Pt performing sit<>stand from EOB with Min-Mod A +2 and RW; x3. HR elevating in standing (see general commends). Pt enjoying and motivated by music. Continue to present with decreased strength, balance, activity tolerance, and cognition. Continue to recommend dc to SNF and will continue to follow acutely as admitted.   Follow Up Recommendations  SNF;Supervision/Assistance - 24 hour    Equipment Recommendations  Hospital bed (to be continually assessed and updated)    Recommendations for Other Services      Precautions / Restrictions Precautions Precautions: Fall Precaution Comments: Abdomen incision with wound vac       Mobility Bed Mobility Overal bed mobility: Needs Assistance Bed Mobility: Supine to Sit;Sit to Supine     Supine to sit: Mod assist;+2 for physical assistance;HOB elevated Sit to supine: Max assist;+2 for physical assistance   General bed mobility comments: Managing LLE towards EOB. Elevating trunk and bringing hips towards EOB  with use of pad requiring Mod A. Max A +2 for return to supine    Transfers Overall transfer level: Needs assistance Equipment used: Rolling walker (2 wheeled) Transfers: Sit to/from Stand Sit to Stand: Min assist;Mod assist;+2 physical assistance;From elevated surface         General transfer comment: Min-Mod A +2 for power up from EOB. Side steps towards HOB. Needing seated rest breaks due to fatigue and elevating HR    Balance Overall balance assessment: Needs assistance Sitting-balance support: Bilateral upper extremity supported Sitting balance-[name]: Fair     Standing balance support: During functional activity;Bilateral upper extremity supported Standing balance-[name]: Poor Standing balance comment: Reliant on Ue support                           ADL either performed or assessed with clinical judgement   ADL Overall ADL's : Needs assistance/impaired     Grooming: Set up;Bed level;Wash/dry face Grooming Details (indicate cue type and reason): washing her face                 Toilet Transfer: Moderate assistance;+2 for physical assistance;Minimal assistance Toilet Transfer Details (indicate cue type and reason): Min-Mod A +2 for power up from EOB. Side steps towards HOB. Needing seated rest breaks due to fatigue and elevating HR         Functional mobility during ADLs: Moderate assistance;+2 for physical assistance General ADL Comments: Pt agreeable to sitting at EOB, sit<>stand, and side steps towards HOB.     Vision   Vision Assessment?: No apparent visual deficits   Perception     Praxis  Cognition Arousal/Alertness: Awake/alert;Lethargic Behavior During Therapy: WFL for tasks assessed/performed;Flat affect Overall Cognitive Status: History of cognitive impairments - at baseline                                 General Comments: Spouse reports pt with declined short term memory baseline.  Pt in good  spirits this session. Singing along to music and following simple commands. Moments of "child like" demeanor and "baby talk" such as repeating words and echoing therapist.        Exercises Exercises: General Lower Extremity General Exercises - Lower Extremity Long Arc Quad: AROM;Both;10 reps;Seated   Shoulder Instructions       General Comments Husband present. HR elevating to 150-160 (max 170) in standing. Quickly returnogn to 110-120 with sitting    Pertinent Vitals/ Pain       Pain Assessment: Faces Faces Pain Scale: Hurts little more Pain Location: abdomen, back with rolling Pain Descriptors / Indicators: Grimacing;Guarding Pain Intervention(s): Monitored during session;Limited activity within patient's tolerance;Repositioned  Home Living                                          Prior Functioning/Environment              Frequency  Min 2X/week        Progress Toward Goals  OT Goals(current goals can now be found in the care plan section)  Progress towards OT goals: Progressing toward goals  Acute Rehab OT Goals Patient Stated Goal: pt would like to be able to move around better, be able to cook again. husband would like for pt to go to rehab to progress functional abilities OT Goal Formulation: With patient/family Time For Goal Achievement: 09/18/20 Potential to Achieve Goals: Good ADL Goals Pt Will Perform Grooming: with set-up;sitting Pt Will Perform Upper Body Dressing: with set-up;sitting Pt Will Perform Lower Body Dressing: with mod assist;sitting/lateral leans;sit to/from stand Pt Will Transfer to Toilet: with mod assist;stand pivot transfer;bedside commode Additional ADL Goal #1: Pt to complete bed mobility at Min A in prep for ADL transfers  Plan Discharge plan remains appropriate    Co-evaluation                 AM-PAC OT "6 Clicks" Daily Activity     Outcome Measure   Help from another person eating meals?: A  Little Help from another person taking care of personal grooming?: A Little Help from another person toileting, which includes using toliet, bedpan, or urinal?: Total Help from another person bathing (including washing, rinsing, drying)?: Total Help from another person to put on and taking off regular upper body clothing?: A Lot Help from another person to put on and taking off regular lower body clothing?: Total 6 Click Score: 11    End of Session Equipment Utilized During Treatment: Rolling walker;Gait belt  OT Visit Diagnosis: Unsteadiness on feet (R26.81);Other abnormalities of gait and mobility (R26.89);Muscle weakness (generalized) (M62.81);Other symptoms and signs involving cognitive function;Pain Pain - part of body:  (abdomen)   Activity Tolerance Patient tolerated treatment well   Patient Left in bed;with call bell/phone within reach;with bed alarm set;with family/visitor present   Nurse Communication Mobility status        Time: 6333-5456 OT Time Calculation (min): 26 min  Charges: OT General Charges $OT Visit: 1  Visit OT Treatments $Self Care/Home Management : 8-22 mins  [place], OTR/L Acute Rehab Pager: [phone] Office: [place] 09/06/2020, 5:26 PM

## 2020-09-06 NOTE — Progress Notes (Signed)
PROGRESS NOTE    Rhonda [name]  HWE:[id number] DOB: 1960-06-12 DOA: 08/29/2020 PCP: [place].    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   Significant events: 3/8, admission with rectal bleeding, EGD and colonoscopy negative.   4/4-4/6 admitted with hematochezia, symptomatically treated, capsule endoscopy and discharged home. 4/13 presented with hematochezia, CT angiogram with acute bleeding in the descending colon status post coil embolization of the distal Arcade branch of the superior sigmoidal artery. Insulin infusion for high blood sugars.  Admitted to ICU. 4/15, transferred out of ICU.  4/16, afternoon after eating lunch, patient had severe abdominal pain.  Relieved with pain medication.  A CT scan showed visceral perforation.  Underwent urgent exploratory laparotomy and found to have necrotic sigmoid colon.  Status post Jeanette Caprice procedure with colostomy and wound VAC for midline incision. 4/21, no return of bowel function yet.   Assessment & Plan:   Active Problems:   GI bleed   Hypotension   Rectal bleeding   AKI (acute kidney injury) ([place])  Perforated sigmoid colon with history of diverticulosis and multiple procedures.  IMA embolization 4/13. Emergency exploratory laparotomy and Hartman's procedure 4/16. Postop management as per surgery. Chest physiotherapy and incentive spirometry, adequate pain management, using IV Dilaudid.  Patient has decreased mobility at baseline.  Will need skilled care on discharge. N.p.o. with ice chips. oral medications with sips.  No return of bowel function yet. PICC line and TPN today. Peritoneal cultures negative so  far.Remains on ciprofloxacin and Flagyl due to feculent peritonitis.  Stabilized.  Completed therapy. Waiting for return of bowel function.  Acute lower GI bleeding, diverticular bleeding.  Hemorrhagic shock secondary to acute GI bleeding and anemia of blood loss: Known diverticular disease. status post coil embolization 4/13.  Hemoglobin dropped since admission but is stable and not needing for any more transfusion.    Hopefully her disease part of the sigmoid colon is removed and will stop bleeding further.    Acute renal failure: Due to #1.  Improved and normalized.    Type 2 diabetes with hyperglycemia: N.p.o. now.  On IV fluids.  Keeping on lower doses of long-acting insulin.  Sugars acceptable. We will need to adjust insulin doses once she is on TPN.  Neurosarcoidosis/chronic debility and wheelchair-bound status:  Patient on prednisone 40 mg twice a day at home.  Given Solu-Medrol perioperative.  Now on oral prednisone.  On gabapentin.  If recurrent vomiting, will change to IV.  Obstructive sleep apnea: Declined CPAP.  Not using.  Hypertension: On metoprolol.  Can take by mouth.  Hypochloremic hyponatremia: Normalizing.  Recheck tomorrow.  Hypomagnesemia/hypokalemia: Replace and monitor levels.  DVT prophylaxis: SCDs Start: 08/29/20 2256   Code Status: Full code Family Communication: None today. Disposition Plan: Status is: Inpatient  Remains inpatient appropriate because:Inpatient level of care appropriate due to severity of illness   Dispo: The patient is from: Home              Anticipated d/c is to: Skilled nursing facility.              Patient currently is not medically stable to d/c.   Difficult to place patient no         Consultants:   PCCM  Gastroenterology  Interventional  radiology  Surgery.  Procedures:   Embolization inferior mesenteric artery 4/13  Hartman's procedure 4/16.  Antimicrobials:  Antibiotics Given (last 72 hours)     Date/Time Action Medication Dose Rate   09/03/20 1954 New Bag/Given   metroNIDAZOLE (FLAGYL) IVPB 500 mg 500 mg 100 mL/hr   09/03/20 2100 New Bag/Given   ciprofloxacin (CIPRO) IVPB 400 mg 400 mg 200 mL/hr   09/04/20 0308 New Bag/Given   metroNIDAZOLE (FLAGYL) IVPB 500 mg 500 mg 100 mL/hr   09/04/20 0240 New Bag/Given   ciprofloxacin (CIPRO) IVPB 400 mg 400 mg 200 mL/hr   09/04/20 1143 New Bag/Given   metroNIDAZOLE (FLAGYL) IVPB 500 mg 500 mg 100 mL/hr   09/04/20 1746 New Bag/Given   ciprofloxacin (CIPRO) IVPB 400 mg 400 mg 200 mL/hr   09/04/20 1748 New Bag/Given   metroNIDAZOLE (FLAGYL) IVPB 500 mg 500 mg 100 mL/hr   09/05/20 0313 New Bag/Given   metroNIDAZOLE (FLAGYL) IVPB 500 mg 500 mg 100 mL/hr   09/05/20 0537 New Bag/Given   ciprofloxacin (CIPRO) IVPB 400 mg 400 mg 200 mL/hr   09/05/20 1228 New Bag/Given   metroNIDAZOLE (FLAGYL) IVPB 500 mg 500 mg 100 mL/hr   09/05/20 1827 New Bag/Given   ciprofloxacin (CIPRO) IVPB 400 mg 400 mg 200 mL/hr   09/05/20 2024 New Bag/Given   metroNIDAZOLE (FLAGYL) IVPB 500 mg 500 mg 100 mL/hr       Subjective: Patient seen and examined.  No overnight events.  Denies any nausea vomiting.  She denies any abdominal pain today.   Objective: Vitals:   09/05/20 2130 09/05/20 2337 09/06/20 0355 09/06/20 1201  BP: (!) 156/90 (!) 155/93 (!) 144/99   Pulse: (!) 106 (!) 103 98   Resp: 18 18 18    Temp: 98.6 F (37 C) 97.9 F (36.6 C) 97.7 F (36.5 C)   TempSrc: Oral Oral Oral   SpO2: 100% 99% 100%   Weight:   87.6 kg 92.9 kg  Height:        Intake/Output Summary (Last 24 hours) at 09/06/2020 1329 Last data filed at 09/06/2020 0900 Gross per 24 hour  Intake 2015.16 ml  Output 4050 ml  Net -2034.84 ml   Filed Weights   09/01/20 0448 09/06/20 0355 09/06/20 1201  Weight: 83.1 kg 87.6 kg 92.9 kg    Examination: General: Frail and debilitated.  Chronically sick looking.  Not in any distress.  On room air. Patient has cushingoid  face. Overall poor historian.  She is alert oriented however sometimes needs to repeat instructions. Cardiovascular: S1-S2 normal.  No added sounds. Respiratory: Bilateral clear.  No added sounds. Gastrointestinal: Midline surgical incision clean and dry with wound VAC in place. Patient has left lower quadrant colostomy bag that is empty. Bowel sounds present and sluggish. Ext: No swelling or edema.  No cyanosis.    Data Reviewed: I have personally reviewed following labs and imaging studies  CBC: Recent Labs  Lab 09/02/20 0631 09/03/20 0146 09/04/20 0137 09/05/20 0111 09/06/20 0009  WBC 7.0 11.0* 10.1 8.1 6.5  NEUTROABS 6.3 10.1* 9.4* 7.1 5.6  HGB 12.5 10.5* 8.9* 8.2* 9.4*  HCT 37.9 32.0* 27.1* 25.7* 29.6*  MCV 92.9 94.1 91.2 94.8 94.6  PLT 114* 101* 103* 101* 973*   Basic Metabolic Panel: Recent Labs  Lab 08/31/20 0048 08/31/20 1614 09/01/20 0920 09/01/20 1422 09/02/20 0631 09/03/20 0146 09/04/20 0137 09/05/20 0111 09/06/20 0009  NA 124*   < >  --  125* 128* 127* 129* 134*  --  K 4.9   < >  --  3.8 3.8 4.1 3.6 3.2*  --   CL 89*   < >  --  86* 91* 94* 97* 102  --   CO2 30   < >  --  26 25 26 26 25   --   GLUCOSE 280*   < >  --  173* 207* 161* 173* 154*  --   BUN 9   < >  --  14 15 16 7  <5*  --   CREATININE 0.62   < >  --  0.59 0.70 0.66 0.53 0.52  --   CALCIUM 8.3*   < >  --  8.5* 8.3* 8.0* 8.0* 7.9*  --   MG 2.0  --  1.8  --   --  1.6* 2.1 1.9 1.9  PHOS 4.0  --  3.8  --   --  3.5  --   --   --    < > = values in this interval not displayed.   GFR: Estimated Creatinine Clearance: 82 mL/min (by C-G formula based on SCr of 0.52 mg/dL). Liver Function Tests: No results for input(s): AST, ALT, ALKPHOS, BILITOT, PROT, ALBUMIN in the last 168 hours. No results for input(s): LIPASE, AMYLASE in the last 168 hours. No results for input(s): AMMONIA in the last 168 hours. Coagulation Profile: No results for input(s): INR, PROTIME in the last 168 hours. Cardiac  Enzymes: No results for input(s): CKTOTAL, CKMB, CKMBINDEX, TROPONINI in the last 168 hours. BNP (last 3 results) No results for input(s): PROBNP in the last 8760 hours. HbA1C: No results for input(s): HGBA1C in the last 72 hours. CBG: Recent Labs  Lab 09/05/20 2011 09/05/20 2331 09/06/20 0359 09/06/20 0810 09/06/20 1205  GLUCAP 212* 153* 133* 137* 124*   Lipid Profile: No results for input(s): CHOL, HDL, LDLCALC, TRIG, CHOLHDL, LDLDIRECT in the last 72 hours. Thyroid Function Tests: No results for input(s): TSH, T4TOTAL, FREET4, T3FREE, THYROIDAB in the last 72 hours. Anemia Panel: No results for input(s): VITAMINB12, FOLATE, FERRITIN, TIBC, IRON, RETICCTPCT in the last 72 hours. Sepsis Labs: No results for input(s): PROCALCITON, LATICACIDVEN in the last 168 hours.  Recent Results (from the past 240 hour(s))  Resp Panel by RT-PCR (Flu A&B, Covid) Nasopharyngeal Swab     Status: None   Collection Time: 08/29/20  8:30 PM   Specimen: Nasopharyngeal Swab; Nasopharyngeal(NP) swabs in vial transport medium  Result Value Ref Range Status   SARS Coronavirus 2 by RT PCR NEGATIVE NEGATIVE Final    Comment: (NOTE) SARS-CoV-2 target nucleic acids are NOT DETECTED.  The SARS-CoV-2 RNA is generally detectable in upper respiratory specimens during the acute phase of infection. The lowest concentration of SARS-CoV-2 viral copies this assay can detect is 138 copies/mL. A negative result does not preclude SARS-Cov-2 infection and should not be used as the sole basis for treatment or other patient management decisions. A negative result may occur with  improper specimen collection/handling, submission of specimen other than nasopharyngeal swab, presence of viral mutation(s) within the areas targeted by this assay, and inadequate number of viral copies(<138 copies/mL). A negative result must be combined with clinical observations, patient history, and epidemiological information. The  expected result is Negative.  Fact Sheet for Patients:  EntrepreneurPulse.com.au  Fact Sheet for Healthcare Providers:  IncredibleEmployment.be  This test is no t yet approved or cleared by the [place] FDA and  has been authorized for detection and/or diagnosis of SARS-CoV-2 by FDA under  an Emergency Use Authorization (EUA). This EUA will remain  in effect (meaning this test can be used) for the duration of the COVID-19 declaration under Section 564(b)(1) of the Act, 21 U.S.C.section 360bbb-3(b)(1), unless the authorization is terminated  or revoked sooner.       Influenza A by PCR NEGATIVE NEGATIVE Final   Influenza B by PCR NEGATIVE NEGATIVE Final    Comment: (NOTE) The Xpert Xpress SARS-CoV-2/FLU/RSV plus assay is intended as an aid in the diagnosis of influenza from Nasopharyngeal swab specimens and should not be used as a sole basis for treatment. Nasal washings and aspirates are unacceptable for Xpert Xpress SARS-CoV-2/FLU/RSV testing.  Fact Sheet for Patients: EntrepreneurPulse.com.au  Fact Sheet for Healthcare Providers: IncredibleEmployment.be  This test is not yet approved or cleared by the [place] FDA and has been authorized for detection and/or diagnosis of SARS-CoV-2 by FDA under an Emergency Use Authorization (EUA). This EUA will remain in effect (meaning this test can be used) for the duration of the COVID-19 declaration under Section 564(b)(1) of the Act, 21 U.S.C. section 360bbb-3(b)(1), unless the authorization is terminated or revoked.  Performed at [facility], [place] [address], [place], [place] [address]   MRSA PCR Screening     Status: Abnormal   Collection Time: 08/30/20 12:41 AM   Specimen: Nasopharyngeal  Result Value Ref Range Status   MRSA by PCR POSITIVE (A) NEGATIVE Final    Comment:        The GeneXpert MRSA Assay (FDA approved for NASAL  specimens only), is one component of a comprehensive MRSA colonization surveillance program. It is not intended to diagnose MRSA infection nor to guide or monitor treatment for MRSA infections. RESULT CALLED TO, READ BACK BY AND VERIFIED WITH: Almyra Free RN 08/30/20 0503 JDW Performed at [facility], [address] [place], [place] [address]   Aerobic/Anaerobic Culture w Gram Stain (surgical/deep wound)     Status: None (Preliminary result)   Collection Time: 09/01/20 11:22 PM   Specimen: PATH Cytology Peritoneal fluid; Body Fluid  Result Value Ref Range Status   Specimen Description PERITONEAL FLUID  Final   Special Requests IN SWABS PATIENT ON FOLLOWING CIPRO,FLAGYL  Final   Gram Stain   Final    RARE WBC PRESENT, PREDOMINANTLY PMN NO ORGANISMS SEEN    Culture   Final    NO GROWTH 3 DAYS NO ANAEROBES ISOLATED; CULTURE IN PROGRESS FOR 5 DAYS Performed at [facility], [address], [place], [place] [address]    Report Status PENDING  Incomplete         Radiology Studies: [place] EKG SITE RITE  Result Date: 09/06/2020 If Site Rite image not attached, placement could not be confirmed due to current cardiac rhythm.       Scheduled Meds: . gabapentin  100 mg Oral TID  . insulin aspart  0-15 Units Subcutaneous Q4H  . insulin glargine  8 Units Subcutaneous QHS  . lidocaine  1 patch Transdermal Q24H  . mouth rinse  15 mL Mouth Rinse BID  . metoprolol tartrate  25 mg Oral BID  . pantoprazole (PROTONIX) IV  40 mg Intravenous Q24H  . potassium chloride  20 mEq Oral BID  . predniSONE  40 mg Oral BID   Continuous Infusions: . sodium chloride 125 mL/hr at 09/06/20 0357  . sodium chloride    . TPN ADULT (ION)       LOS: 8 days    Time spent:  30 minutes    Barb Merino, MD [facility] Pager [phone]

## 2020-09-06 NOTE — Progress Notes (Signed)
Patient requesting "something to help me sleep tonight". Spoke with Dr. Cyd Silence, telephone orders received to medicate with one time dose of IV benadryl. Will medicate as ordered and continue to monitor.

## 2020-09-06 NOTE — Progress Notes (Signed)
Physical Therapy Treatment Patient Details Name: Rhonda [name] MRN: [id number] DOB: 02/24/1961 Today's Date: 09/06/2020    History of Present Illness 60 yo female presented to [facility] on 08/29/20 with rectal bleeding (several recent admissions for similar). Pt admitted with hemorrhagic shock.  She is s/p IR for coil embolization of intraluminal extravasation for a distal arcade branch of the IMA on 08/29/20.  She developed abdominal pain and found to have SBO with bowel perforation s/p emergent exp lap, partial colectomy, colostomy, Hartman's and wound vac application 08/26/79.  Medical hx inlcudes DM2, seizures, HTN, DDD, recurrent GIB.  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    PT Comments    The pt was able to demo good progress with therapy today, completing multiple sit-stand transfers from EOB this session, and even some small lateral steps. The pt was limited by poor activity tolerance and fatigue with static stance, HR increased to max 170 bpm with standing >15 seconds. The pt remains highly motivated to improve mobility and function, will benefit from skilled PT acutely to further progress strength, endurance, mobility and capacity for transfers.     Follow Up Recommendations  SNF     Equipment Recommendations  Other (comment)    Recommendations for Other Services       Precautions / Restrictions Precautions Precautions: Fall Precaution Comments: Abdomen incision with wound vac Restrictions Weight Bearing Restrictions: No    Mobility  Bed Mobility Overal bed mobility: Needs Assistance Bed Mobility: Supine to Sit;Sit to Supine     Supine to sit: Mod assist;+2 for physical assistance;HOB elevated Sit to supine: Max assist;+2 for physical assistance   General bed mobility comments: Managing LLE towards EOB. Elevating trunk and bringing hips towards EOB with use of pad requiring Mod A. Max A +2 for return to supine     Transfers Overall transfer level: Needs assistance Equipment used: Rolling walker (2 wheeled) Transfers: Sit to/from Stand Sit to Stand: Min assist;Mod assist;+2 physical assistance;From elevated surface         General transfer comment: Min-Mod A +2 for power up from EOB. Side steps towards HOB. Needing seated rest breaks due to fatigue and elevating HR  Ambulation/Gait Ambulation/Gait assistance: Mod assist;+2 physical assistance;+2 safety/equipment Gait Distance (Feet): 3 Feet Assistive device: Rolling walker (2 wheeled) Gait Pattern/deviations: Step-to pattern;Decreased stride length Gait velocity: decreased Gait velocity interpretation: <1.31 ft/sec, indicative of household ambulator General Gait Details: small lateral steps towards R with modA to steady, minA to RW to manage. limited to 3-4 small steps with minimal clearance       Balance Overall balance assessment: Needs assistance Sitting-balance support: Bilateral upper extremity supported Sitting balance-[name]: Fair     Standing balance support: During functional activity;Bilateral upper extremity supported Standing balance-[name]: Poor Standing balance comment: Reliant on Ue support                            Cognition Arousal/Alertness: Awake/alert;Lethargic Behavior During Therapy: WFL for tasks assessed/performed;Flat affect Overall Cognitive Status: History of cognitive impairments - at baseline                                 General Comments: Spouse reports pt with declined short term memory baseline.  Pt in good spirits this session. Singing along to music and following simple commands. Moments of "child like" demeanor  and "baby talk" such as repeating words and echoing therapist.      Exercises General Exercises - Lower Extremity Long Arc Quad: AROM;Both;10 reps;Seated Hip Flexion/Marching: AROM;Both;10 reps;Seated    General Comments General comments (skin  integrity, edema, etc.): Husband present. HR elevating to 150-160 (max 170) in standing. Quickly returnogn to 110-120 with sitting      Pertinent Vitals/Pain Pain Assessment: Faces Faces Pain Scale: Hurts little more Pain Location: abdomen, back with rolling Pain Descriptors / Indicators: Grimacing;Guarding Pain Intervention(s): Limited activity within patient's tolerance;Monitored during session;Repositioned           PT Goals (current goals can now be found in the care plan section) Acute Rehab PT Goals Patient Stated Goal: pt would like to be able to move around better, be able to cook again. husband would like for pt to go to rehab to progress functional abilities PT Goal Formulation: With patient/family Time For Goal Achievement: 09/17/20 Potential to Achieve Goals: Good Progress towards PT goals: Progressing toward goals    Frequency    Min 2X/week      PT Plan Current plan remains appropriate    Co-evaluation PT/OT/SLP Co-Evaluation/Treatment: Yes Reason for Co-Treatment: To address functional/ADL transfers;For patient/therapist safety          AM-PAC PT "6 Clicks" Mobility   Outcome Measure  Help needed turning from your back to your side while in a flat bed without using bedrails?: A Lot Help needed moving from lying on your back to sitting on the side of a flat bed without using bedrails?: A Lot Help needed moving to and from a bed to a chair (including a wheelchair)?: Total Help needed standing up from a chair using your arms (e.g., wheelchair or bedside chair)?: Total Help needed to walk in hospital room?: Total Help needed climbing 3-5 steps with a railing? : Total 6 Click Score: 8    End of Session Equipment Utilized During Treatment: Gait belt Activity Tolerance: Patient tolerated treatment well Patient left: in bed;with call bell/phone within reach;with family/visitor present Nurse Communication: Mobility status;Need for lift equipment PT Visit  Diagnosis: Other abnormalities of gait and mobility (R26.89);Muscle weakness (generalized) (M62.81);Pain     Time: 1791-5056 PT Time Calculation (min) (ACUTE ONLY): 26 min  Charges:  $Therapeutic Activity: 8-22 mins                     Karma Ganja, PT, DPT   Acute Rehabilitation Department Pager #: [phone]   Otho Bellows 09/06/2020, 5:51 PM

## 2020-09-07 DIAGNOSIS — K625 Hemorrhage of anus and rectum: Secondary | ICD-10-CM | POA: Diagnosis not present

## 2020-09-07 DIAGNOSIS — I9589 Other hypotension: Secondary | ICD-10-CM | POA: Diagnosis not present

## 2020-09-07 DIAGNOSIS — E861 Hypovolemia: Secondary | ICD-10-CM | POA: Diagnosis not present

## 2020-09-07 LAB — GLUCOSE, CAPILLARY
Glucose-Capillary: 259 mg/dL — ABNORMAL HIGH (ref 70–99)
Glucose-Capillary: 275 mg/dL — ABNORMAL HIGH (ref 70–99)
Glucose-Capillary: 312 mg/dL — ABNORMAL HIGH (ref 70–99)
Glucose-Capillary: 290 mg/dL — ABNORMAL HIGH (ref 70–99)
Glucose-Capillary: 306 mg/dL — ABNORMAL HIGH (ref 70–99)
Glucose-Capillary: 315 mg/dL — ABNORMAL HIGH (ref 70–99)

## 2020-09-07 LAB — CBC WITH DIFFERENTIAL/PLATELET
Abs Immature Granulocytes: 0.74 10*3/uL — ABNORMAL HIGH (ref 0.00–0.07)
Basophils Absolute: 0.1 10*3/uL (ref 0.0–0.1)
Basophils Relative: 1 %
Eosinophils Absolute: 0 10*3/uL (ref 0.0–0.5)
Eosinophils Relative: 0 %
HCT: 31.9 % — ABNORMAL LOW (ref 36.0–46.0)
Hemoglobin: 9.7 g/dL — ABNORMAL LOW (ref 12.0–15.0)
Immature Granulocytes: 10 %
Lymphocytes Relative: 6 %
Lymphs Abs: 0.5 10*3/uL — ABNORMAL LOW (ref 0.7–4.0)
MCH: 29.3 pg (ref 26.0–34.0)
MCHC: 30.4 g/dL (ref 30.0–36.0)
MCV: 96.4 fL (ref 80.0–100.0)
Monocytes Absolute: 0.2 10*3/uL (ref 0.1–1.0)
Monocytes Relative: 3 %
Neutro Abs: 6.3 10*3/uL (ref 1.7–7.7)
Neutrophils Relative %: 80 %
Platelets: 108 10*3/uL — ABNORMAL LOW (ref 150–400)
RBC: 3.31 MIL/uL — ABNORMAL LOW (ref 3.87–5.11)
RDW: 17.5 % — ABNORMAL HIGH (ref 11.5–15.5)
WBC: 7.8 10*3/uL (ref 4.0–10.5)
nRBC: 1.3 % — ABNORMAL HIGH (ref 0.0–0.2)

## 2020-09-07 LAB — COMPREHENSIVE METABOLIC PANEL
ALT: 27 U/L (ref 0–44)
AST: 15 U/L (ref 15–41)
Albumin: 2.2 g/dL — ABNORMAL LOW (ref 3.5–5.0)
Alkaline Phosphatase: 217 U/L — ABNORMAL HIGH (ref 38–126)
Anion gap: 10 (ref 5–15)
BUN: 8 mg/dL (ref 6–20)
CO2: 28 mmol/L (ref 22–32)
Calcium: 8.6 mg/dL — ABNORMAL LOW (ref 8.9–10.3)
Chloride: 103 mmol/L (ref 98–111)
Creatinine, Ser: 0.49 mg/dL (ref 0.44–1.00)
GFR, Estimated: >60 mL/min (ref >60)
Glucose, Bld: 274 mg/dL — ABNORMAL HIGH (ref 70–99)
Potassium: 3.9 mmol/L (ref 3.5–5.1)
Sodium: 141 mmol/L (ref 135–145)
Total Bilirubin: 0.8 mg/dL (ref 0.3–1.2)
Total Protein: 4.7 g/dL — ABNORMAL LOW (ref 6.5–8.1)

## 2020-09-07 LAB — TRIGLYCERIDES: Triglycerides: 120 mg/dL (ref <150)

## 2020-09-07 LAB — AEROBIC/ANAEROBIC CULTURE W GRAM STAIN (SURGICAL/DEEP WOUND)

## 2020-09-07 LAB — PREALBUMIN: Prealbumin: 19.4 mg/dL (ref 18–38)

## 2020-09-07 LAB — PHOSPHORUS: Phosphorus: 2.3 mg/dL — ABNORMAL LOW (ref 2.5–4.6)

## 2020-09-07 LAB — MAGNESIUM: Magnesium: 1.9 mg/dL (ref 1.7–2.4)

## 2020-09-07 MED ORDER — MELATONIN 3 MG PO TABS
9.0000 mg | ORAL_TABLET | Freq: Every day | ORAL | Status: DC
  Administered 2020-09-07: 9 mg via ORAL
  Filled 2020-09-07 (×2): qty 3

## 2020-09-07 MED ORDER — SODIUM CHLORIDE 0.9 % IV SOLN: INTRAVENOUS | Status: DC

## 2020-09-07 MED ORDER — INSULIN ASPART 100 UNIT/ML ~~LOC~~ SOLN
3.0000 [IU] | SUBCUTANEOUS | Status: DC
  Administered 2020-09-07 – 2020-09-08 (×5): 3 [IU] via SUBCUTANEOUS

## 2020-09-07 MED ORDER — BOOST / RESOURCE BREEZE PO LIQD CUSTOM
1.0000 | Freq: Three times a day (TID) | ORAL | Status: DC
  Administered 2020-09-07 – 2020-09-09 (×3): 1 via ORAL

## 2020-09-07 MED ORDER — TRAVASOL 10 % IV SOLN
INTRAVENOUS | Status: AC
  Filled 2020-09-07: qty 806.4

## 2020-09-07 MED ORDER — LORAZEPAM 0.5 MG PO TABS
0.5000 mg | ORAL_TABLET | Freq: Three times a day (TID) | ORAL | Status: DC | PRN
  Administered 2020-09-07 – 2020-09-19 (×6): 0.5 mg via ORAL
  Filled 2020-09-07 (×6): qty 1

## 2020-09-07 MED ORDER — INSULIN ASPART 100 UNIT/ML ~~LOC~~ SOLN
0.0000 [IU] | SUBCUTANEOUS | Status: DC
  Administered 2020-09-07 (×2): 15 [IU] via SUBCUTANEOUS
  Administered 2020-09-07: 11 [IU] via SUBCUTANEOUS
  Administered 2020-09-07: 15 [IU] via SUBCUTANEOUS
  Administered 2020-09-08: 20 [IU] via SUBCUTANEOUS
  Administered 2020-09-08: 11 [IU] via SUBCUTANEOUS
  Administered 2020-09-08 (×2): 20 [IU] via SUBCUTANEOUS
  Administered 2020-09-08: 15 [IU] via SUBCUTANEOUS
  Administered 2020-09-09: 11 [IU] via SUBCUTANEOUS
  Administered 2020-09-09: 7 [IU] via SUBCUTANEOUS
  Administered 2020-09-09: 11 [IU] via SUBCUTANEOUS
  Administered 2020-09-09: 15 [IU] via SUBCUTANEOUS
  Administered 2020-09-09: 11 [IU] via SUBCUTANEOUS
  Administered 2020-09-09: 15 [IU] via SUBCUTANEOUS
  Administered 2020-09-10 (×5): 7 [IU] via SUBCUTANEOUS
  Administered 2020-09-10: 11 [IU] via SUBCUTANEOUS
  Administered 2020-09-11: 4 [IU] via SUBCUTANEOUS
  Administered 2020-09-11 (×2): 3 [IU] via SUBCUTANEOUS
  Administered 2020-09-11: 7 [IU] via SUBCUTANEOUS
  Administered 2020-09-11: 11 [IU] via SUBCUTANEOUS
  Administered 2020-09-11: 4 [IU] via SUBCUTANEOUS
  Administered 2020-09-12: 7 [IU] via SUBCUTANEOUS
  Administered 2020-09-12: 11 [IU] via SUBCUTANEOUS
  Administered 2020-09-12 (×2): 7 [IU] via SUBCUTANEOUS
  Administered 2020-09-12: 11 [IU] via SUBCUTANEOUS
  Administered 2020-09-12: 7 [IU] via SUBCUTANEOUS
  Administered 2020-09-13: 4 [IU] via SUBCUTANEOUS
  Administered 2020-09-13 (×2): 7 [IU] via SUBCUTANEOUS
  Administered 2020-09-13: 4 [IU] via SUBCUTANEOUS
  Administered 2020-09-13: 7 [IU] via SUBCUTANEOUS
  Administered 2020-09-14: 4 [IU] via SUBCUTANEOUS
  Administered 2020-09-14: 3 [IU] via SUBCUTANEOUS
  Administered 2020-09-14: 4 [IU] via SUBCUTANEOUS
  Administered 2020-09-14: 3 [IU] via SUBCUTANEOUS
  Administered 2020-09-14: 7 [IU] via SUBCUTANEOUS
  Administered 2020-09-14: 4 [IU] via SUBCUTANEOUS
  Administered 2020-09-14 – 2020-09-16 (×6): 3 [IU] via SUBCUTANEOUS
  Administered 2020-09-16 (×3): 4 [IU] via SUBCUTANEOUS
  Administered 2020-09-16 – 2020-09-17 (×3): 3 [IU] via SUBCUTANEOUS
  Administered 2020-09-17 – 2020-09-18 (×4): 4 [IU] via SUBCUTANEOUS
  Administered 2020-09-18 (×2): 3 [IU] via SUBCUTANEOUS
  Administered 2020-09-19: 7 [IU] via SUBCUTANEOUS
  Administered 2020-09-19: 4 [IU] via SUBCUTANEOUS

## 2020-09-07 MED ORDER — HALOPERIDOL LACTATE 5 MG/ML IJ SOLN
2.0000 mg | Freq: Four times a day (QID) | INTRAMUSCULAR | Status: DC | PRN
  Administered 2020-09-07: 2 mg via INTRAVENOUS
  Filled 2020-09-07 (×2): qty 1

## 2020-09-07 NOTE — Progress Notes (Signed)
PROGRESS NOTE    Rhonda [name]  [id number] DOB: 02/06/1961 DOA: 08/29/2020 PCP: [facility].    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   Significant events: 3/8, admission with rectal bleeding, EGD and colonoscopy negative.   4/4-4/6 admitted with hematochezia, symptomatically treated, capsule endoscopy and discharged home. 4/13 presented with hematochezia, CT angiogram with acute bleeding in the descending colon status post coil embolization of the distal [place]ade branch of the superior sigmoidal artery. Insulin infusion for high blood sugars.  Admitted to ICU. 4/15, transferred out of ICU.  4/16, afternoon after eating lunch, patient had severe abdominal pain.  Relieved with pain medication.  A CT scan showed visceral perforation.  Underwent urgent exploratory laparotomy and found to have necrotic sigmoid colon.  Status post Jeanette Caprice procedure with colostomy and wound VAC for midline incision. 4/22, no return of bowel function yet.  Started on TPN.   Assessment & Plan:   Active Problems:   GI bleed   Hypotension   Rectal bleeding   AKI (acute kidney injury) ([place])  Perforated sigmoid colon with history of diverticulosis and multiple procedures.  IMA embolization 4/13. Emergency exploratory laparotomy and Hartman's procedure 4/16. Prolonged ileus. Chest physiotherapy and incentive spirometry, adequate pain management, using IV Dilaudid.  Patient has decreased mobility at baseline.  Will need skilled care on discharge. On clear liquids.  Not eating much.  Oral medications with sips.  No return of bowel function yet. PICC line and TPN started. Peritoneal cultures  negative.completed antibiotic therapy.  Waiting for return of bowel function. TPN/electrolytes and blood sugars managed by pharmacy.  Acute lower GI bleeding, diverticular bleeding.  Hemorrhagic shock secondary to acute GI bleeding and anemia of blood loss: Known diverticular disease. status post coil embolization 4/13.  Hemoglobin dropped since admission but is stable and not needing for any more transfusion.    Hopefully her disease part of the sigmoid colon is removed and will stop bleeding further.    Acute renal failure: Due to #1.  Improved and normalized.    Type 2 diabetes with hyperglycemia: On TPN.  On increasing dose of insulin.  Neurosarcoidosis/chronic debility and wheelchair-bound status:  Patient on prednisone 40 mg twice a day at home.  Given Solu-Medrol perioperative.  Now on oral prednisone.  On gabapentin.  If recurrent vomiting, will change to IV.  Tolerating oral medications. Patient does have history of developing delirium and emotional outburst when she is sick in the hospital, Prevent delirium, delirium precautions and preventions. Uses melatonin 9 mg at night to sleep, will start. Will use a small dose of Ativan to help her relax for anxiety and also to help with sleep.  Obstructive sleep apnea: Declined CPAP.  Not using.  Hypertension: On metoprolol.  Can take by mouth.  Heart rate acceptable.  Hypochloremic hyponatremia: Normalizing.   Hypomagnesemia/hypokalemia: Replaced and adequate.  DVT prophylaxis: SCDs Start: 08/29/20 2256   Code Status: Full code Family Communication: Husband at the bedside. Disposition Plan: Status is: Inpatient  Remains inpatient appropriate because:Inpatient level of care appropriate due to severity of illness   Dispo: The patient is from: Home              Anticipated d/c is to: Skilled nursing facility.  Patient currently is not medically stable to d/c.   Difficult to place patient no          Consultants:   PCCM  Gastroenterology  Interventional radiology  Surgery.  Procedures:   Embolization inferior mesenteric artery 4/13  Hartman's procedure 4/16.  Antimicrobials:  Antibiotics Given (last 72 hours)    Date/Time Action Medication Dose Rate   09/04/20 1746 New Bag/Given   ciprofloxacin (CIPRO) IVPB 400 mg 400 mg 200 mL/hr   09/04/20 1748 New Bag/Given   metroNIDAZOLE (FLAGYL) IVPB 500 mg 500 mg 100 mL/hr   09/05/20 0313 New Bag/Given   metroNIDAZOLE (FLAGYL) IVPB 500 mg 500 mg 100 mL/hr   09/05/20 0537 New Bag/Given   ciprofloxacin (CIPRO) IVPB 400 mg 400 mg 200 mL/hr   09/05/20 1228 New Bag/Given   metroNIDAZOLE (FLAGYL) IVPB 500 mg 500 mg 100 mL/hr   09/05/20 1827 New Bag/Given   ciprofloxacin (CIPRO) IVPB 400 mg 400 mg 200 mL/hr   09/05/20 2024 New Bag/Given   metroNIDAZOLE (FLAGYL) IVPB 500 mg 500 mg 100 mL/hr       Subjective: Patient seen and examined.  She feels somehow bloated and making a lot of gas.  Nothing on the colostomy pouch. Last night she is complaining of vivid dreams, emotional instability, she is crying at times. "I dream of getting better, I dreamed of having a bowel movement" Husband at the bedside tells me that when she is very sick she develops these types of delirium.   Objective: Vitals:   09/07/20 0337 09/07/20 0816 09/07/20 0900 09/07/20 1203  BP: (!) 148/97 (!) 161/96  (!) 159/95  Pulse: 98 (!) 105  99  Resp: 18 20  17   Temp: 98.6 F (37 C) 98.2 F (36.8 C)  98.1 F (36.7 C)  TempSrc: Oral Oral  Oral  SpO2: 100% 100% 100% 97%  Weight:      Height:        Intake/Output Summary (Last 24 hours) at 09/07/2020 1403 Last data filed at 09/07/2020 0904 Gross per 24 hour  Intake 991.73 ml  Output 2430 ml  Net -1438.27 ml   Filed Weights   09/01/20 0448 09/06/20 0355 09/06/20 1201  Weight: 83.1 kg 87.6 kg 92.9 kg    Examination: General: Frail and debilitated.  Chronically sick looking.  Patient on room  air. Patient has cushingoid face. Patient is alert and oriented.  Emotional and crying.  Repeating sentences. Cardiovascular: S1-S2 normal.  No added sounds. Respiratory: Bilateral clear.  No added sounds. Gastrointestinal: Midline surgical incision clean and dry with wound VAC in place. Patient has left lower quadrant colostomy bag that is empty. Bowel sounds sluggish. Ext: No swelling or edema.  No cyanosis.    Data Reviewed: I have personally reviewed following labs and imaging studies  CBC: Recent Labs  Lab 09/03/20 0146 09/04/20 0137 09/05/20 0111 09/06/20 0009 09/07/20 0444  WBC 11.0* 10.1 8.1 6.5 7.8  NEUTROABS 10.1* 9.4* 7.1 5.6 6.3  HGB 10.5* 8.9* 8.2* 9.4* 9.7*  HCT 32.0* 27.1* 25.7* 29.6* 31.9*  MCV 94.1 91.2 94.8 94.6 96.4  PLT 101* 103* 101* 114* 902*   Basic Metabolic Panel: Recent Labs  Lab 09/01/20 0920 09/01/20 1422 09/02/20 0631 09/03/20 0146 09/04/20 0137 09/05/20 0111 09/06/20 0009 09/07/20 0444  NA  --    < > 128* 127* 129* 134*  --  141  K  --    < > 3.8 4.1 3.6 3.2*  --  3.9  CL  --    < >  91* 94* 97* 102  --  103  CO2  --    < > 25 26 26 25   --  28  GLUCOSE  --    < > 207* 161* 173* 154*  --  274*  BUN  --    < > 15 16 7  <5*  --  8  CREATININE  --    < > 0.70 0.66 0.53 0.52  --  0.49  CALCIUM  --    < > 8.3* 8.0* 8.0* 7.9*  --  8.6*  MG 1.8  --   --  1.6* 2.1 1.9 1.9 1.9  PHOS 3.8  --   --  3.5  --   --   --  2.3*   < > = values in this interval not displayed.   GFR: Estimated Creatinine Clearance: 82 mL/min (by C-G formula based on SCr of 0.49 mg/dL). Liver Function Tests: Recent Labs  Lab 09/07/20 0444  AST 15  ALT 27  ALKPHOS 217*  BILITOT 0.8  PROT 4.7*  ALBUMIN 2.2*   No results for input(s): LIPASE, AMYLASE in the last 168 hours. No results for input(s): AMMONIA in the last 168 hours. Coagulation Profile: No results for input(s): INR, PROTIME in the last 168 hours. Cardiac Enzymes: No results for input(s): CKTOTAL,  CKMB, CKMBINDEX, TROPONINI in the last 168 hours. BNP (last 3 results) No results for input(s): PROBNP in the last 8760 hours. HbA1C: No results for input(s): HGBA1C in the last 72 hours. CBG: Recent Labs  Lab 09/06/20 2048 09/06/20 2257 09/07/20 0307 09/07/20 0814 09/07/20 1207  GLUCAP 204* 285* 259* 275* 312*   Lipid Profile: Recent Labs    09/07/20 0444  TRIG 120   Thyroid Function Tests: No results for input(s): TSH, T4TOTAL, FREET4, T3FREE, THYROIDAB in the last 72 hours. Anemia Panel: No results for input(s): VITAMINB12, FOLATE, FERRITIN, TIBC, IRON, RETICCTPCT in the last 72 hours. Sepsis Labs: No results for input(s): PROCALCITON, LATICACIDVEN in the last 168 hours.  Recent Results (from the past 240 hour(s))  Resp Panel by RT-PCR (Flu A&B, Covid) Nasopharyngeal Swab     Status: None   Collection Time: 08/29/20  8:30 PM   Specimen: Nasopharyngeal Swab; Nasopharyngeal(NP) swabs in vial transport medium  Result Value Ref Range Status   SARS Coronavirus 2 by RT PCR NEGATIVE NEGATIVE Final    Comment: (NOTE) SARS-CoV-2 target nucleic acids are NOT DETECTED.  The SARS-CoV-2 RNA is generally detectable in upper respiratory specimens during the acute phase of infection. The lowest concentration of SARS-CoV-2 viral copies this assay can detect is 138 copies/mL. A negative result does not preclude SARS-Cov-2 infection and should not be used as the sole basis for treatment or other patient management decisions. A negative result may occur with  improper specimen collection/handling, submission of specimen other than nasopharyngeal swab, presence of viral mutation(s) within the areas targeted by this assay, and inadequate number of viral copies(<138 copies/mL). A negative result must be combined with clinical observations, patient history, and epidemiological information. The expected result is Negative.  Fact Sheet for Patients:   EntrepreneurPulse.com.au  Fact Sheet for Healthcare Providers:  IncredibleEmployment.be  This test is no t yet approved or cleared by the [place] FDA and  has been authorized for detection and/or diagnosis of SARS-CoV-2 by FDA under an Emergency Use Authorization (EUA). This EUA will remain  in effect (meaning this test can be used) for the duration of the COVID-19 declaration under Section 564(b)(1) of the  Act, 21 U.S.C.section 360bbb-3(b)(1), unless the authorization is terminated  or revoked sooner.       Influenza A by PCR NEGATIVE NEGATIVE Final   Influenza B by PCR NEGATIVE NEGATIVE Final    Comment: (NOTE) The Xpert Xpress SARS-CoV-2/FLU/RSV plus assay is intended as an aid in the diagnosis of influenza from Nasopharyngeal swab specimens and should not be used as a sole basis for treatment. Nasal washings and aspirates are unacceptable for Xpert Xpress SARS-CoV-2/FLU/RSV testing.  Fact Sheet for Patients: EntrepreneurPulse.com.au  Fact Sheet for Healthcare Providers: IncredibleEmployment.be  This test is not yet approved or cleared by the [place] FDA and has been authorized for detection and/or diagnosis of SARS-CoV-2 by FDA under an Emergency Use Authorization (EUA). This EUA will remain in effect (meaning this test can be used) for the duration of the COVID-19 declaration under Section 564(b)(1) of the Act, 21 U.S.C. section 360bbb-3(b)(1), unless the authorization is terminated or revoked.  Performed at [facility], [place] [address], [place], [place] [address]   MRSA PCR Screening     Status: Abnormal   Collection Time: 08/30/20 12:41 AM   Specimen: Nasopharyngeal  Result Value Ref Range Status   MRSA by PCR POSITIVE (A) NEGATIVE Final    Comment:        The GeneXpert MRSA Assay (FDA approved for NASAL specimens only), is one component of a comprehensive MRSA  colonization surveillance program. It is not intended to diagnose MRSA infection nor to guide or monitor treatment for MRSA infections. RESULT CALLED TO, READ BACK BY AND VERIFIED WITH: Almyra Free RN 08/30/20 0503 JDW Performed at [facility], [address] [place], [place] [address]   Aerobic/Anaerobic Culture w Gram Stain (surgical/deep wound)     Status: None (Preliminary result)   Collection Time: 09/01/20 11:22 PM   Specimen: PATH Cytology Peritoneal fluid; Body Fluid  Result Value Ref Range Status   Specimen Description PERITONEAL FLUID  Final   Special Requests IN SWABS PATIENT ON FOLLOWING CIPRO,FLAGYL  Final   Gram Stain   Final    RARE WBC PRESENT, PREDOMINANTLY PMN NO ORGANISMS SEEN    Culture   Final    MIXED ANAEROBIC FLORA PRESENT.  CALL LAB IF FURTHER IID REQUIRED. NO AEROBIC GROWTH AT 4 DAYS Performed at [facility] [address], [place], [place] [address]    Report Status PENDING  Incomplete         Radiology Studies: [place] EKG SITE RITE  Result Date: 09/06/2020 If Site Rite image not attached, placement could not be confirmed due to current cardiac rhythm.       Scheduled Meds: . Chlorhexidine Gluconate Cloth  6 each Topical Daily  . gabapentin  100 mg Oral TID  . insulin aspart  0-20 Units Subcutaneous Q4H  . insulin aspart  3 Units Subcutaneous Q4H  . insulin glargine  8 Units Subcutaneous QHS  . lidocaine  1 patch Transdermal Q24H  . mouth rinse  15 mL Mouth Rinse BID  . melatonin  9 mg Oral QHS  . metoprolol tartrate  25 mg Oral BID  . pantoprazole (PROTONIX) IV  40 mg Intravenous Q24H  . predniSONE  40 mg Oral BID   Continuous Infusions: . sodium chloride 85 mL/hr at 09/06/20 2217  . sodium chloride    . TPN ADULT (ION) 40 mL/hr at 09/06/20 1851  . TPN ADULT (ION)       LOS: 9 days  Time spent: 30 minutes    Barb Merino, MD [facility] Pager [phone]

## 2020-09-07 NOTE — Progress Notes (Incomplete)
While bathing patient, patient belching and small amount of output noted from mouth (approximately 3-5 cc of brownish/greenish colored emesis mixed with saliva). Patient denies nausea, has been belching tonight but no flatus noted by this RN. No output noted in colostomy pouch as of yet.

## 2020-09-07 NOTE — Progress Notes (Signed)
Initial Nutrition Assessment  DOCUMENTATION CODES:   Not applicable  INTERVENTION:    TPN dosing per Pharmacy to meet nutrition needs.  Boost Breeze po TID, each supplement provides 250 kcal and 9 grams of protein.  NUTRITION DIAGNOSIS:   Inadequate oral intake related to altered GI function as evidenced by other (comment) (NPO/Clear liquid diet).  GOAL:   Patient will meet greater than or equal to 90% of their needs  MONITOR:   PO intake,Supplement acceptance,Diet advancement,Labs,Skin,I & O's  REASON FOR ASSESSMENT:   NPO/Clear Liquid Diet,Consult New TPN/TNA  ASSESSMENT:   60 yo female admitted with frank rectal bleeding and hypotension. 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   4/13 - S/P coil embolization of the distal [place] branch of the superior sigmoidal artery. 4/16 - Developed severe abdominal pain. CT showed visceral perforation. S/P emergent ex lap, found to have necrotic sigmoid colon. 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   Patient developed post-op ileus. Diet was advanced to clear liquids on 4/21, minimal intake.  TPN initiated 4/21.   TPN currently infusing at 40 ml/h. Increasing to 80 ml/h today to provide 1920 ml, 1337 kcal, 81 gm protein.  Labs reviewed. Phos 2.3 CBG: [phone]  Medications reviewed and include Novolog SSI, Novolog 3 units q 4 hours, Lantus, Protonix, prednisone, KCl.   Lowest weight since admission 79.5 kg on 4/14. Currently 92.9 kg (increased with swelling/edema).  Patient reports usual weight ~175 lbs.  She requests chicken broth with meals, says it is her favorite.   NUTRITION - FOCUSED PHYSICAL EXAM:  Flowsheet Row Most Recent Value  Orbital Region No depletion  Upper Arm Region No depletion  Thoracic and Lumbar Region No depletion  Buccal Region No depletion  Temple Region No  depletion  Clavicle Bone Region No depletion  Clavicle and Acromion Bone Region No depletion  Scapular Bone Region No depletion  Dorsal Hand No depletion  Patellar Region No depletion  Anterior Thigh Region No depletion  Posterior Calf Region No depletion  Edema (RD Assessment) None  Hair Reviewed  Eyes Reviewed  Mouth Reviewed  Skin Reviewed  Nails Reviewed       Diet Order:   Diet Order            Diet clear liquid Room service appropriate? Yes; Fluid consistency: Thin  Diet effective now                 EDUCATION NEEDS:   Not appropriate for education at this time  Skin:  Skin Assessment: Skin Integrity Issues: Skin Integrity Issues:: Other (Comment),Incisions,Wound VAC Wound Vac: abdominal incision Incisions: R chest; abdomen Other: skin tear R thigh  Last BM:  4/16  Height:   Ht Readings from Last 1 Encounters:  08/30/20 5\' 3"  (1.6 m)    Weight:   Wt Readings from Last 1 Encounters:  09/06/20 92.9 kg    Ideal Body Weight:  52.3 kg  BMI:  Body mass index is 36.28 kg/m.  Estimated Nutritional Needs:   Kcal:  1900-2100  Protein:  110-125 gm  Fluid:  >/= 2 L    Lucas Mallow, RD, LDN, CNSC Please refer to Amion for contact information.

## 2020-09-07 NOTE — Consult Note (Addendum)
[facility] Nurse wound follow up Surgical PA at the bedside to assess wound appearance during Vac dressing change.  Pt denied c/o pain and tolerated procedure with minimal amt pain.  Applied one piece of balck foam to 167mm cont suction.  Abd full thickness post-op wound is beefy red with small amt pink drainage.  WOC will plan to change dressing Q M/W/F  WOC Nurse ostomy follow up Surgical PA assessed stoma appearance Stoma type/location: LUQ colostomy Stomal assessment/size: 1 3/4 inches, dark brown and slightly oval, flush with skin level  Peristomal assessment: intact Treatment options for stomal/peristomal skin: barrier ring and convex pouch Output: scant amt brown liquid Ostomy pouching: 1pc. flexible convex [name] #[id number] and barrier ring, [place] # [phone]. 5 sets of supplies ordered to the room for staff nurse use. Education provided: No family present for teaching session today.  Pt watched the procedure and asked appropriate questions.  Enrolled patient in [facility] Start Discharge program: Yes. Previously [facility] team will continue to follow for ostomy teaching next week. Julien Girt MSN, RN, [place], [place], [place]

## 2020-09-07 NOTE — Progress Notes (Signed)
6 Days Post-Op  Subjective: CC: Having some soreness around her incision but otherwise denies pain. Had some bloating with cld last night. Has not eaten this morning. WOCN reports scant stool and air in colostomy bag this am. Patient denies n/v.   Objective: Vital signs in last 24 hours: Temp:  [97.4 F (36.3 C)-98.6 F (37 C)] 98.2 F (36.8 C) (04/22 0816) Pulse Rate:  [90-105] 105 (04/22 0816) Resp:  [18-20] 20 (04/22 0816) BP: (125-176)/(91-103) 161/96 (04/22 0816) SpO2:  [100 %] 100 % (04/22 0816) Weight:  [92.9 kg] 92.9 kg (04/21 1201) Last BM Date: 09/01/20  Intake/Output from previous day: 04/21 0701 - 04/22 0700 In: 1617.9 [I.V.:1617.9] Out: 2500 [Urine:2500] Intake/Output this shift: Total I/O In: -  Out: 630 [Urine:600; Stool:30]  PE: Gen: Alert, NAD, pleasant Heart: Tachycardic  Pulm:CTA b/l. Normal rate and effort Abd: Soft,mild distension,mildgeneralized tenderness that is stable from yesterday and appears appropriate in the post operative setting, slightly hypoactive bowel sounds. Stoma red slightly budded and viable. Midline wound with mix of fatty tissue and granulation tissue at wound edges and base. No evidence of evisceration or dehiscence. No surrounding cellulitis. Vac was reapplied.  Msk: Trace pedal edema b/l. SCD's in place        Lab Results:  Recent Labs    09/06/20 0009 09/07/20 0444  WBC 6.5 7.8  HGB 9.4* 9.7*  HCT 29.6* 31.9*  PLT 114* 108*   BMET Recent Labs    09/05/20 0111 09/07/20 0444  NA 134* 141  K 3.2* 3.9  CL 102 103  CO2 25 28  GLUCOSE 154* 274*  BUN <5* 8  CREATININE 0.52 0.49  CALCIUM 7.9* 8.6*   PT/INR No results for input(s): LABPROT, INR in the last 72 hours. CMP     Component Value Date/Time   NA 141 09/07/2020 0444   NA 129 (L) 08/06/2020 1003   K 3.9 09/07/2020 0444   CL 103 09/07/2020 0444   CO2 28 09/07/2020 0444   GLUCOSE 274 (H) 09/07/2020 0444   BUN 8 09/07/2020 0444   BUN 6  08/06/2020 1003   CREATININE 0.49 09/07/2020 0444   CREATININE 0.85 08/02/2015 1710   CALCIUM 8.6 (L) 09/07/2020 0444   PROT 4.7 (L) 09/07/2020 0444   PROT 4.9 (L) 08/06/2020 1003   ALBUMIN 2.2 (L) 09/07/2020 0444   ALBUMIN 3.2 (L) 08/06/2020 1003   AST 15 09/07/2020 0444   ALT 27 09/07/2020 0444   ALKPHOS 217 (H) 09/07/2020 0444   BILITOT 0.8 09/07/2020 0444   BILITOT 0.4 08/06/2020 1003   GFRNONAA >60 09/07/2020 0444   GFRNONAA 78 08/02/2015 1710   GFRAA >60 01/17/2020 0546   GFRAA >89 08/02/2015 1710   Lipase     Component Value Date/Time   LIPASE 64 (H) 07/23/2020 0432       Studies/Results: [place] EKG SITE RITE  Result Date: 09/06/2020 If Site Rite image not attached, placement could not be confirmed due to current cardiac rhythm.   Anti-infectives: Anti-infectives (From admission, onward)   Start     Dose/Rate Route Frequency Ordered Stop   09/01/20 1930  ciprofloxacin (CIPRO) IVPB 400 mg        400 mg 200 mL/hr over 60 Minutes Intravenous Every 12 hours 09/01/20 1836 09/05/20 1927   09/01/20 1930  metroNIDAZOLE (FLAGYL) IVPB 500 mg        500 mg 100 mL/hr over 60 Minutes Intravenous Every 8 hours 09/01/20 1836 09/05/20 2124  Assessment/Plan Hx of HTN DM2 Neurosarcoidosis/chronic debility and wheelchair-bound status- on chronic steroids.  OSA ABL anemia - hgb 9.7 and stable  Rhonda [name] is a 60 year old female with a history of GI bleed s/p angioembolization of a branch of the IMA on 4/13, who developed a necrotic section of proximal sigmoid colon with perforation and feculent peritonitis - S/pExploratory Laparotomy, Partial Colectomy (sigmoid), colostomy creation,Hartman's procedure on 4/16/2022by Dr. Grandville Silos - POD #6 -Ileus post-op. Keep on CLD and  TPN - Completed 4 days post op abx due to feculent peritonitis. Afebrile and wbc wnl (on chronic steroids).  -Continue PT/OT, mobilize - recommending SNF - WOC following for ostomy  education -Vac changes MWF - Pulm toilet  FEN -CLOD, IVF per TRH, start TPN VTE -SCDs,okay for chemical prophylaxis from a general surgery standpoint ID -Cipro/Flagyl4/16 - 4/20. None currently Foley - None   LOS: 9 days    Rhonda [name] , [facility] Surgery 09/07/2020, 9:46 AM Please see Amion for pager number during day hours 7:00am-4:30pm

## 2020-09-07 NOTE — Progress Notes (Signed)
Patient requesting "something to help me sleep tonight", states that the benadryl she received last night did not work and she remained awake all night. Spoke with Dr. Cyd Silence, orders received to medicate with trazodone prn nightly for sleep. Will medicate as ordered and continue to monitor.

## 2020-09-07 NOTE — Progress Notes (Signed)
PHARMACY - TOTAL PARENTERAL NUTRITION CONSULT NOTE   Indication: Prolonged ileus  Patient Measurements: Height: 5' 3" (160 cm) Weight: 92.9 kg (204 lb 12.9 oz) IBW/kg (Calculated) : 52.4 TPN AdjBW (KG): 59.2 Body mass index is 36.28 kg/m.  Assessment: 59 YOF who presented with necrotic section of proximal sigmoid colon with perforation and feculent peritonitis now s/p ex lap with partial sigmoid colectomy and colostomy and Hartman's procedure on 09/01/20. Developed ileus post op and has remained NPO. Pharmacy consulted to start TPN.   Glucose / Insulin: A1c 11.8. CBGs up to 285 on mSSI and lantus 8 units daily. Has required 27 units of SSI over 24 hours  -on prednisone 40 mg BID  Electrolytes: Na 141, K 3.9 (on KCl BID), MG 1.9, Phos 2.3, CO2 28 Renal: SCr wnl, BUN 8 Hepatic: AST/ALT and Tbili wnl. Alk Phos 217 Intake / Output; MIVF: UOP 2.5L, -882mL yest,  GI Imaging: 4/16: SBO with evidence of perforation and free fluid in the abdomen. GI Surgeries / Procedures:  4/16: ex lap with partial sigmoid colectomy and colostomy and Hartman's procedure  Central access: PICC line ordered for today  TPN start date: 09/06/20   Nutritional Goals: Per RD recommendations on 4/22 1900-2100 Kcal, 110-125 g protein  Current Nutrition:  Clear liquids  Plan:  Increase TPN to 80 mL/hr at 1800. Goal rate of 100 mL/hr.  Electrolytes in TPN: Na 75mEq/L, K 20mEq/L, Ca 5mEq/L, Mg 5mEq/L, and increase Phos to 20mmol/L. Cl:Ac 1:1 Add standard MVI and trace elements to TPN Increase to resistant SSI with lantus 8 units QHS and add 3 units of scheduled novolog q4 hours.  Reduce MIVF to 40 mL/hr at 1800 Monitor TPN labs on Mon/Thurs   , PharmD., BCPS, BCCCP Clinical Pharmacist Please refer to AMION for unit-specific pharmacist    

## 2020-09-08 DIAGNOSIS — G4733 Obstructive sleep apnea (adult) (pediatric): Secondary | ICD-10-CM

## 2020-09-08 DIAGNOSIS — E1165 Type 2 diabetes mellitus with hyperglycemia: Secondary | ICD-10-CM | POA: Diagnosis not present

## 2020-09-08 DIAGNOSIS — K625 Hemorrhage of anus and rectum: Secondary | ICD-10-CM | POA: Diagnosis not present

## 2020-09-08 DIAGNOSIS — N179 Acute kidney failure, unspecified: Secondary | ICD-10-CM | POA: Diagnosis not present

## 2020-09-08 DIAGNOSIS — D8689 Sarcoidosis of other sites: Secondary | ICD-10-CM

## 2020-09-08 DIAGNOSIS — I9589 Other hypotension: Secondary | ICD-10-CM | POA: Diagnosis not present

## 2020-09-08 LAB — CBC WITH DIFFERENTIAL/PLATELET
Abs Immature Granulocytes: 0.97 10*3/uL — ABNORMAL HIGH (ref 0.00–0.07)
Basophils Absolute: 0.1 10*3/uL (ref 0.0–0.1)
Basophils Relative: 1 %
Eosinophils Absolute: 0 10*3/uL (ref 0.0–0.5)
Eosinophils Relative: 0 %
HCT: 34.3 % — ABNORMAL LOW (ref 36.0–46.0)
Hemoglobin: 10.3 g/dL — ABNORMAL LOW (ref 12.0–15.0)
Immature Granulocytes: 10 %
Lymphocytes Relative: 7 %
Lymphs Abs: 0.7 10*3/uL (ref 0.7–4.0)
MCH: 29.4 pg (ref 26.0–34.0)
MCHC: 30 g/dL (ref 30.0–36.0)
MCV: 98 fL (ref 80.0–100.0)
Monocytes Absolute: 0.4 10*3/uL (ref 0.1–1.0)
Monocytes Relative: 4 %
Neutro Abs: 8 10*3/uL — ABNORMAL HIGH (ref 1.7–7.7)
Neutrophils Relative %: 78 %
Platelets: 115 10*3/uL — ABNORMAL LOW (ref 150–400)
RBC: 3.5 MIL/uL — ABNORMAL LOW (ref 3.87–5.11)
RDW: 17.3 % — ABNORMAL HIGH (ref 11.5–15.5)
WBC: 10 10*3/uL (ref 4.0–10.5)
nRBC: 1.6 % — ABNORMAL HIGH (ref 0.0–0.2)

## 2020-09-08 LAB — GLUCOSE, CAPILLARY
Glucose-Capillary: 279 mg/dL — ABNORMAL HIGH (ref 70–99)
Glucose-Capillary: 318 mg/dL — ABNORMAL HIGH (ref 70–99)
Glucose-Capillary: 338 mg/dL — ABNORMAL HIGH (ref 70–99)
Glucose-Capillary: 364 mg/dL — ABNORMAL HIGH (ref 70–99)
Glucose-Capillary: 397 mg/dL — ABNORMAL HIGH (ref 70–99)
Glucose-Capillary: 406 mg/dL — ABNORMAL HIGH (ref 70–99)
Glucose-Capillary: 423 mg/dL — ABNORMAL HIGH (ref 70–99)

## 2020-09-08 LAB — BASIC METABOLIC PANEL
Anion gap: 10 (ref 5–15)
BUN: 9 mg/dL (ref 6–20)
CO2: 26 mmol/L (ref 22–32)
Calcium: 8.9 mg/dL (ref 8.9–10.3)
Chloride: 103 mmol/L (ref 98–111)
Creatinine, Ser: 0.5 mg/dL (ref 0.44–1.00)
GFR, Estimated: >60 mL/min (ref >60)
Glucose, Bld: 338 mg/dL — ABNORMAL HIGH (ref 70–99)
Potassium: 3.3 mmol/L — ABNORMAL LOW (ref 3.5–5.1)
Sodium: 139 mmol/L (ref 135–145)

## 2020-09-08 LAB — PHOSPHORUS: Phosphorus: 3.2 mg/dL (ref 2.5–4.6)

## 2020-09-08 LAB — MAGNESIUM: Magnesium: 1.9 mg/dL (ref 1.7–2.4)

## 2020-09-08 MED ORDER — FAT EMUL FISH OIL/PLANT BASED 20% (SMOFLIPID)IV EMUL
INTRAVENOUS | Status: AC
  Filled 2020-09-08: qty 951.6

## 2020-09-08 MED ORDER — INSULIN ASPART 100 UNIT/ML ~~LOC~~ SOLN
3.0000 [IU] | SUBCUTANEOUS | Status: DC
  Administered 2020-09-08 – 2020-09-09 (×4): 3 [IU] via SUBCUTANEOUS

## 2020-09-08 MED ORDER — INSULIN ASPART 100 UNIT/ML ~~LOC~~ SOLN
5.0000 [IU] | SUBCUTANEOUS | Status: AC
  Administered 2020-09-08 (×2): 5 [IU] via SUBCUTANEOUS

## 2020-09-08 MED ORDER — POTASSIUM CHLORIDE 10 MEQ/50ML IV SOLN
10.0000 meq | INTRAVENOUS | Status: AC
  Administered 2020-09-08 (×4): 10 meq via INTRAVENOUS
  Filled 2020-09-08 (×4): qty 50

## 2020-09-08 MED ORDER — INSULIN GLARGINE 100 UNIT/ML ~~LOC~~ SOLN
10.0000 [IU] | Freq: Once | SUBCUTANEOUS | Status: AC
  Administered 2020-09-08: 10 [IU] via SUBCUTANEOUS
  Filled 2020-09-08: qty 0.1

## 2020-09-08 MED ORDER — MAGNESIUM SULFATE IN D5W 1-5 GM/100ML-% IV SOLN
1.0000 g | Freq: Once | INTRAVENOUS | Status: AC
  Administered 2020-09-08: 1 g via INTRAVENOUS
  Filled 2020-09-08: qty 100

## 2020-09-08 NOTE — Progress Notes (Signed)
PHARMACY - TOTAL PARENTERAL NUTRITION CONSULT NOTE   Indication: Prolonged ileus  Patient Measurements: Height: 5\' 3"  (160 cm) Weight: 90.3 kg (199 lb 1.2 oz) IBW/kg (Calculated) : 52.4 TPN AdjBW (KG): 59.2 Body mass index is 35.26 kg/m.  Assessment: [name] who presented with necrotic section of proximal sigmoid colon with perforation and feculent peritonitis now s/p ex lap with partial sigmoid colectomy and colostomy and Hartman's procedure on 09/01/20. Developed ileus post op and has remained NPO. Pharmacy consulted to start TPN.   Glucose / Insulin: A1c 11.8. CBGs 280-330 on rSSI + lantus 8 units/day + 3 units q4h, has required 54 units SSI/12h since TPN rate increased, 84 units over the past 24h. The patient has been resumed on her PTA prednisone dosing of 40 mg bid in the setting of neurosarcoidosis. Elevated CBGs will prevent further titration in TPN - will add insulin R to TPN bag and adjust insulin orders to better manage in the short term.   Electrolytes: Na 139, K 3.3, Mg 1.9, Phos 3.2, Cl/HCO3 wnl Renal: SCr <1, BUN 9, pre-albumin 19.4 (4/22) Hepatic: AST/ALT and Tbili wnl. Alk Phos 217 Intake / Output; MIVF: UOP 1.2 ml/kg/hr, Net I/O: +1.1L, colostomy 30 cc/24h GI Imaging: 4/16: SBO with evidence of perforation and free fluid in the abdomen. GI Surgeries / Procedures:  4/16: ex lap with partial sigmoid colectomy and colostomy and Hartman's procedure  Central access: PICC line ordered for today  TPN start date: 09/06/20   Nutritional Goals: Per RD recommendations on 4/22 1900-2100 Kcal, 110-125 g protein Goal rate of 85 ml/hr will provide 1902 kcal, 124g protein  Current Nutrition:  TPN Clear liquids - breeze tid between meals ordered  Plan:  Adjust re-calculated TPN to 65 ml/hr to provide 1455 kcal, 220g CHO, 95g protein (prior TPN with 1335 kcal and 230g CHO) - no planned advancement today due to elevated CBGs Electrolytes in TPN: Na 53mEq/L, increase K to 50 mEq/L, Ca  57mEq/L, increase Mg to 7 mEq/L, and reduce Phos 109mmol/L. Cl:Ac 1:1 Magnesium 1g IV x 1 today, K-runs x 4 Add standard MVI and trace elements to TPN Add 45 units insulin R to TPN bag Give lantus 10 units x 1 dose this AM D/c evening lantus, continue with resistant SSI and adjust to 5 units of scheduled novolog q4h until new bag hung this evening - then back to 3 units Continue MIVF at 45 mL/hr at 1800 Monitor TPN labs on Mon/Thurs  Thank you for allowing pharmacy to be a part of this patient's care.  Alycia Rossetti, PharmD, BCPS Clinical Pharmacist Clinical phone for 09/08/2020: [phone] 09/08/2020 9:10 AM   **Pharmacist phone directory can now be found on [email or web address] ([email or web address]).  Listed under [place].

## 2020-09-08 NOTE — Progress Notes (Signed)
7 Days Post-Op  Subjective: CC: Confused this morning. Denies pain or nausea.    Objective: Vital signs in last 24 hours: Temp:  [97.5 F (36.4 C)-99 F (37.2 C)] 97.6 F (36.4 C) (04/23 0511) Pulse Rate:  [93-123] 108 (04/23 0511) Resp:  [17-20] 18 (04/23 0511) BP: (138-159)/(72-100) 149/72 (04/23 0511) SpO2:  [94 %-100 %] 94 % (04/23 0511) Weight:  [90.3 kg] 90.3 kg (04/23 0500) Last BM Date: 09/01/20  Intake/Output from previous day: 04/22 0701 - 04/23 0700 In: 2404.5 [I.V.:2404.5] Out: 2880 [Urine:2700; Drains:150; Stool:30] Intake/Output this shift: No intake/output data recorded.  PE: Gen: Alert, confused  Heart: regular  Pulm:CTA b/l. Normal rate and effort Abd: obese, soft, nontender. Wound vac in place. Stoma difficult to visualize but appears to have some necrosis/sloughing. See yesterday's note Msk: Trace pedal edema b/l. SCD's in place     Lab Results:  Recent Labs    09/07/20 0444 09/08/20 0305  WBC 7.8 10.0  HGB 9.7* 10.3*  HCT 31.9* 34.3*  PLT 108* 115*   BMET Recent Labs    09/07/20 0444 09/08/20 0305  NA 141 139  K 3.9 3.3*  CL 103 103  CO2 28 26  GLUCOSE 274* 338*  BUN 8 9  CREATININE 0.49 0.50  CALCIUM 8.6* 8.9   PT/INR No results for input(s): LABPROT, INR in the last 72 hours. CMP     Component Value Date/Time   NA 139 09/08/2020 0305   NA 129 (L) 08/06/2020 1003   K 3.3 (L) 09/08/2020 0305   CL 103 09/08/2020 0305   CO2 26 09/08/2020 0305   GLUCOSE 338 (H) 09/08/2020 0305   BUN 9 09/08/2020 0305   BUN 6 08/06/2020 1003   CREATININE 0.50 09/08/2020 0305   CREATININE 0.85 08/02/2015 1710   CALCIUM 8.9 09/08/2020 0305   PROT 4.7 (L) 09/07/2020 0444   PROT 4.9 (L) 08/06/2020 1003   ALBUMIN 2.2 (L) 09/07/2020 0444   ALBUMIN 3.2 (L) 08/06/2020 1003   AST 15 09/07/2020 0444   ALT 27 09/07/2020 0444   ALKPHOS 217 (H) 09/07/2020 0444   BILITOT 0.8 09/07/2020 0444   BILITOT 0.4 08/06/2020 1003   GFRNONAA >60  09/08/2020 0305   GFRNONAA 78 08/02/2015 1710   GFRAA >60 01/17/2020 0546   GFRAA >89 08/02/2015 1710   Lipase     Component Value Date/Time   LIPASE 64 (H) 07/23/2020 0432       Studies/Results: [place] EKG SITE RITE  Result Date: 09/06/2020 If Site Rite image not attached, placement could not be confirmed due to current cardiac rhythm.   Anti-infectives: Anti-infectives (From admission, onward)   Start     Dose/Rate Route Frequency Ordered Stop   09/01/20 1930  ciprofloxacin (CIPRO) IVPB 400 mg        400 mg 200 mL/hr over 60 Minutes Intravenous Every 12 hours 09/01/20 1836 09/05/20 1927   09/01/20 1930  metroNIDAZOLE (FLAGYL) IVPB 500 mg        500 mg 100 mL/hr over 60 Minutes Intravenous Every 8 hours 09/01/20 1836 09/05/20 2124       Assessment/Plan Hx of HTN DM2 Neurosarcoidosis/chronic debility and wheelchair-bound status- on chronic steroids.  OSA ABL anemia - hgb stable  Ms. Erck is a 60 year old female with a history of GI bleed s/p angioembolization of a branch of the IMA on 4/13, who developed a necrotic section of proximal sigmoid colon with perforation and feculent peritonitis - S/pExploratory Laparotomy, Partial Colectomy (  sigmoid), colostomy creation,Hartman's procedure on 4/16/2022by Dr. Grandville Silos - POD #7 -Ileus post-op. Keep on CLD and  TPN - Completed 4 days post op abx due to feculent peritonitis. Afebrile and wbc wnl (on chronic steroids).  -Continue PT/OT, mobilize - recommending SNF - WOC following for ostomy education -Vac changes MWF - Pulm toilet  FEN -CLOD, IVF per TRH, start TPN VTE -SCDs,okay for chemical prophylaxis from a general surgery standpoint ID -Cipro/Flagyl4/16 - 4/20. None currently Foley - None   LOS: 10 days   Possible CT this weekend if no progression.   Clovis Riley , [facility] 09/08/2020, 8:37 AM Please see Amion for pager number during day hours 7:00am-4:30pm

## 2020-09-08 NOTE — Progress Notes (Signed)
Notified Dr. Earnest Conroy patient's CBG 426.

## 2020-09-08 NOTE — Plan of Care (Signed)
  Problem: Activity: Goal: Risk for activity intolerance will decrease Outcome: Not Progressing   Problem: Nutrition: Goal: Adequate nutrition will be maintained Outcome: Not Progressing   Problem: Coping: Goal: Level of anxiety will decrease Outcome: Not Progressing   Problem: Elimination: Goal: Will not experience complications related to urinary retention Outcome: Progressing   Problem: Pain Managment: Goal: General experience of comfort will improve Outcome: Progressing   Problem: Safety: Goal: Ability to remain free from injury will improve Outcome: Not Progressing

## 2020-09-08 NOTE — Progress Notes (Signed)
PROGRESS NOTE    Rhonda [name]  [id number]  DOB: July 13, 1960  PCP: [facility] date:08/29/2020 Chief compliant: Rectal bleeding 60 year old female with history of hypertension, obesity, obstructive sleep apnea not using CPAP, neurosarcoidosis on chronic prednisone therapy, wheelchair-bound due to chronic debility, recurrent GI bleeding with recent multiple hospitalizations presented back to the emergency room with frank rectal bleeding and hypotension.  Patient has had previous admissions (3/8 and 4/4-4/6) for hematochezia and underwent EGD, colonoscopy, capsule endoscopy-treated supportively and discharged home.  Hospital course:  On this presentation, patient reported hematochezia and underwent CT angiogram which revealed acute bleeding in the descending colon- coil embolization of the distal [place] branch of the superior sigmoidal artery. Insulin infusion for high blood sugars.  Admitted to ICU.  Patient eventually transferred out of ICU on 4/15.  However on 4/16, patient complained of severe abdominal pain after eating lunch and CT abdomen/pelvis showed visceral perforation.  She underwent urgent exploratory laparotomy and found to have necrotic sigmoid colon.  Status post Jeanette Caprice procedure with colostomy and wound VAC for midline incision.  She was started on TPN on 4/22  Subjective: Patient confused and yelling that she wants her food back.  She pointed towards the boost/water glass that was on nurses computer desk and demanded that be given to her.  She calmed down after water glass was passed over to her by me and assured that she will be allowed to have the boost as well if no procedure scheduled for today.  She denies any pain or discomfort otherwise.  States she is hungry and wishes to eat.  Objective: Vitals:   09/08/20 0511 09/08/20 1500 09/08/20 1550 09/08/20 1800  BP: (!) 149/72 (!) 143/96 126/80 137/74  Pulse: (!) 108 (!) 116 (!) 111   Resp: 18 16 14 16    Temp: 97.6 F (36.4 C) 98.8 F (37.1 C) 98.2 F (36.8 C) 98.6 F (37 C)  TempSrc: Axillary Axillary Oral Axillary  SpO2: 94% 100% 97% 96%  Weight:      Height:        Intake/Output Summary (Last 24 hours) at 09/08/2020 1932 Last data filed at 09/08/2020 1838 Gross per 24 hour  Intake 1886.7 ml  Output 3750 ml  Net -1863.3 ml   Filed Weights   09/06/20 0355 09/06/20 1201 09/08/20 0500  Weight: 87.6 kg 92.9 kg 90.3 kg    Physical Examination:  General: Moderately built, no acute respiratory distress but appears anxious, emotional and shouting. Head ENT: Atraumatic normocephalic, PERRLA, neck supple Heart: S1-S2 heard, regular rate and rhythm, no murmurs.  No leg edema noted Lungs: Equal air entry bilaterally, no rhonchi or rales on exam, no accessory muscle use Abdomen: Midline surgical incision clean, wound VAC in place.  Has left lower quadrant colostomy bag in place.   Extremities: No pedal edema.  No cyanosis or clubbing. Neurological: Awake alert but confused, no focal weakness or numbness, strength and sensations to crude touch intact Skin: No wounds or rashes.  Data Reviewed: I have personally reviewed following labs and imaging studies  CBC: Recent Labs  Lab 09/04/20 0137 09/05/20 0111 09/06/20 0009 09/07/20 0444 09/08/20 0305  WBC 10.1 8.1 6.5 7.8 10.0  NEUTROABS 9.4* 7.1 5.6 6.3 8.0*  HGB 8.9* 8.2* 9.4* 9.7* 10.3*  HCT 27.1* 25.7* 29.6* 31.9* 34.3*  MCV 91.2 94.8 94.6 96.4 98.0  PLT 103* 101* 114* 108* [id number]*   Basic Metabolic Panel: Recent Labs  Lab 09/03/20 0146 09/04/20 0137 09/05/20 0111 09/06/20  0009 09/07/20 0444 09/08/20 0305  NA 127* 129* 134*  --  141 139  K 4.1 3.6 3.2*  --  3.9 3.3*  CL 94* 97* 102  --  103 103  CO2 26 26 25   --  28 26  GLUCOSE 161* 173* 154*  --  274* 338*  BUN 16 7 <5*  --  8 9  CREATININE 0.66 0.53 0.52  --  0.49 0.50  CALCIUM 8.0* 8.0* 7.9*  --  8.6* 8.9  MG 1.6* 2.1 1.9 1.9 1.9 1.9  PHOS 3.5  --   --   --   2.3* 3.2   GFR: Estimated Creatinine Clearance: 80.8 mL/min (by C-G formula based on SCr of 0.5 mg/dL). Liver Function Tests: Recent Labs  Lab 09/07/20 0444  AST 15  ALT 27  ALKPHOS 217*  BILITOT 0.8  PROT 4.7*  ALBUMIN 2.2*   No results for input(s): LIPASE, AMYLASE in the last 168 hours. No results for input(s): AMMONIA in the last 168 hours. Coagulation Profile: No results for input(s): INR, PROTIME in the last 168 hours. Cardiac Enzymes: No results for input(s): CKTOTAL, CKMB, CKMBINDEX, TROPONINI in the last 168 hours. BNP (last 3 results) No results for input(s): PROBNP in the last 8760 hours. HbA1C: No results for input(s): HGBA1C in the last 72 hours. CBG: Recent Labs  Lab 09/08/20 0409 09/08/20 0759 09/08/20 1205 09/08/20 1343 09/08/20 1730  GLUCAP 338* 279* 406* 397* 423*   Lipid Profile: Recent Labs    09/07/20 0444  TRIG 120   Thyroid Function Tests: No results for input(s): TSH, T4TOTAL, FREET4, T3FREE, THYROIDAB in the last 72 hours. Anemia Panel: No results for input(s): VITAMINB12, FOLATE, FERRITIN, TIBC, IRON, RETICCTPCT in the last 72 hours. Sepsis Labs: No results for input(s): PROCALCITON, LATICACIDVEN in the last 168 hours.  Recent Results (from the past 240 hour(s))  Resp Panel by RT-PCR (Flu A&B, Covid) Nasopharyngeal Swab     Status: None   Collection Time: 08/29/20  8:30 PM   Specimen: Nasopharyngeal Swab; Nasopharyngeal(NP) swabs in vial transport medium  Result Value Ref Range Status   SARS Coronavirus 2 by RT PCR NEGATIVE NEGATIVE Final    Comment: (NOTE) SARS-CoV-2 target nucleic acids are NOT DETECTED.  The SARS-CoV-2 RNA is generally detectable in upper respiratory specimens during the acute phase of infection. The lowest concentration of SARS-CoV-2 viral copies this assay can detect is 138 copies/mL. A negative result does not preclude SARS-Cov-2 infection and should not be used as the sole basis for treatment or other  patient management decisions. A negative result may occur with  improper specimen collection/handling, submission of specimen other than nasopharyngeal swab, presence of viral mutation(s) within the areas targeted by this assay, and inadequate number of viral copies(<138 copies/mL). A negative result must be combined with clinical observations, patient history, and epidemiological information. The expected result is Negative.  Fact Sheet for Patients:  EntrepreneurPulse.com.au  Fact Sheet for Healthcare Providers:  IncredibleEmployment.be  This test is no t yet approved or cleared by the [place] FDA and  has been authorized for detection and/or diagnosis of SARS-CoV-2 by FDA under an Emergency Use Authorization (EUA). This EUA will remain  in effect (meaning this test can be used) for the duration of the COVID-19 declaration under Section 564(b)(1) of the Act, 21 U.S.C.section 360bbb-3(b)(1), unless the authorization is terminated  or revoked sooner.       Influenza A by PCR NEGATIVE NEGATIVE Final   Influenza B by  PCR NEGATIVE NEGATIVE Final    Comment: (NOTE) The Xpert Xpress SARS-CoV-2/FLU/RSV plus assay is intended as an aid in the diagnosis of influenza from Nasopharyngeal swab specimens and should not be used as a sole basis for treatment. Nasal washings and aspirates are unacceptable for Xpert Xpress SARS-CoV-2/FLU/RSV testing.  Fact Sheet for Patients: EntrepreneurPulse.com.au  Fact Sheet for Healthcare Providers: IncredibleEmployment.be  This test is not yet approved or cleared by the [place] FDA and has been authorized for detection and/or diagnosis of SARS-CoV-2 by FDA under an Emergency Use Authorization (EUA). This EUA will remain in effect (meaning this test can be used) for the duration of the COVID-19 declaration under Section 564(b)(1) of the Act, 21 U.S.C. section  360bbb-3(b)(1), unless the authorization is terminated or revoked.  Performed at [facility], [place] [address] [place], [place] [address]   MRSA PCR Screening     Status: Abnormal   Collection Time: 08/30/20 12:41 AM   Specimen: Nasopharyngeal  Result Value Ref Range Status   MRSA by PCR POSITIVE (A) NEGATIVE Final    Comment:        The GeneXpert MRSA Assay (FDA approved for NASAL specimens only), is one component of a comprehensive MRSA colonization surveillance program. It is not intended to diagnose MRSA infection nor to guide or monitor treatment for MRSA infections. RESULT CALLED TO, READ BACK BY AND VERIFIED WITH: Almyra Free RN 08/30/20 0503 JDW Performed at [facility], [address], [place], [place] [address]   Aerobic/Anaerobic Culture w Gram Stain (surgical/deep wound)     Status: None   Collection Time: 09/01/20 11:22 PM   Specimen: PATH Cytology Peritoneal fluid; Body Fluid  Result Value Ref Range Status   Specimen Description PERITONEAL FLUID  Final   Special Requests IN SWABS PATIENT ON FOLLOWING CIPRO,FLAGYL  Final   Gram Stain   Final    RARE WBC PRESENT, PREDOMINANTLY PMN NO ORGANISMS SEEN    Culture   Final    MIXED ANAEROBIC FLORA PRESENT.  CALL LAB IF FURTHER IID REQUIRED. NO GROWTH AEROBICALLY Performed at [facility] [address] [place], [place] [address]    Report Status 09/07/2020 FINAL  Final      Radiology Studies: No results found.    Scheduled Meds: . Chlorhexidine Gluconate Cloth  6 each Topical Daily  . feeding supplement  1 Container Oral TID BM  . gabapentin  100 mg Oral TID  . insulin aspart  0-20 Units Subcutaneous Q4H  . insulin aspart  3 Units Subcutaneous Q4H  . insulin aspart  5 Units Subcutaneous Q4H  . lidocaine  1 patch Transdermal Q24H  . mouth rinse  15 mL Mouth Rinse BID  . melatonin  9 mg Oral QHS  . metoprolol tartrate  25 mg Oral BID  . pantoprazole (PROTONIX) IV  40 mg Intravenous  Q24H  . predniSONE  40 mg Oral BID   Continuous Infusions: . sodium chloride 45 mL/hr at 09/08/20 0046  . magnesium sulfate bolus IVPB 1 g (09/08/20 1838)  . TPN ADULT (ION) 65 mL/hr at 09/08/20 1714      Assessment/Plan:  1.  Perforated sigmoid colon: Underwent IMA embolization on 4/13 followed by emergent exploratory laparotomy and Hartman's procedure on 4/16 for visceral perforation.  Postop course complicated by prolonged ileus and started on TPN due to lack of return of bowel function.  On liquid diet and appears to have appetite, complaining of being hungry.  Will  monitor clinically and obtain serial imaging if notice any abdominal distention.  Currently appears benign.  2.  Rectal bleeding: Resolved.  Underwent IMA embolization on 413 for sigmoid bleeding-diverticular source likely.  Hemoglobin initially dropped but now uptrending.  Continue to monitor and transfuse as needed.  3.  AKI: In the setting of problem #1 and 2.  Now improved and to baseline renal function.  4.  Neurosarcoidosis, chronic confusion: Patient does have history of neurosarcoidosis and has chronic debility and emotional instability per previous progress notes.  She also has history of delirium and prior hospitalizations.  This morning her behavior appears to be more of a intellectual disability rather than delirium.  Continue melatonin at bedtime, Ativan as needed for anxiety.  On prednisone 40 mg twice daily at baseline-received Solu-Medrol perioperatively and now back on oral prednisone/gabapentin.  5.  Diabetes mellitus type 2, uncontrolled with hyperglycemia: Patient now also on TPN and her insulin is being adjusted via TPN by pharmacy.  Her blood glucose did go up to 400 today-will defer management to pharmacy for now.  Also on short-acting scheduled insulin, sliding scale.  6.  Hypomagnesemia/hypokalemia: Receiving IV magnesium today.  Replace p.o. potassium as needed.  7.  Hypertension: Continue  metoprolol.  8.  Obstructive sleep apnea: Noncompliant with CPAP use, declined here.  DVT prophylaxis: SCDs in the setting of problem #2 Code Status: Full code Family / Patient Communication: Husband not at bedside today.  Will update in a.m. Disposition Plan:   Status is: Inpatient  Remains inpatient appropriate because:Persistent severe electrolyte disturbances, Altered mental status, IV treatments appropriate due to intensity of illness or inability to take PO and Inpatient level of care appropriate due to severity of illness   Dispo: The patient is from: Home              Anticipated d/c is to: SNF              Patient currently is not medically stable to d/c.   Difficult to place patient No     Time spent: 25 MINS     >50% time spent in discussions with care team and coordination of care.    Guilford Shi, MD [facility] Pager in [place]  If 7PM-7AM, please contact night-coverage [email or web address] 09/08/2020, 7:32 PM

## 2020-09-08 NOTE — Progress Notes (Signed)
Sent consult to IV team as TPN is at 200 mL

## 2020-09-08 NOTE — Progress Notes (Signed)
   09/08/20 2005  Assess: MEWS Score  Temp 97.8 F (36.6 C)  BP 117/71  Pulse Rate (!) 114  Resp 18  Level of Consciousness Alert  SpO2 98 %  O2 Device Nasal Cannula  Patient Activity (if Appropriate) In bed  O2 Flow Rate (L/min) 2 L/min  Assess: MEWS Score  MEWS Temp 0  MEWS Systolic 0  MEWS Pulse 2  MEWS RR 0  MEWS LOC 0  MEWS Score 2  MEWS Score Color Yellow  Assess: if the MEWS score is Yellow or Red  Were vital signs taken at a resting state? Yes  Focused Assessment No change from prior assessment  Early Detection of Sepsis Score *See Row Information* Low  MEWS guidelines implemented *See Row Information* No, previously yellow, continue vital signs every 4 hours  Treat  MEWS Interventions Administered prn meds/treatments  Take Vital Signs  Increase Vital Sign Frequency  Yellow: Q 2hr X 2 then Q 4hr X 2, if remains yellow, continue Q 4hrs  Escalate  MEWS: Escalate Yellow: discuss with charge nurse/RN and consider discussing with provider and RRT  Notify: Charge Nurse/RN  Name of Charge Nurse/RN Notified Nikki RN  Date Charge Nurse/RN Notified 09/08/20  Time Charge Nurse/RN Notified 2000  Document  Patient Outcome Other (Comment);Stabilized after interventions (pain relief)  Progress note created (see row info) Yes   Safety and comfort maintained. Will continue to monitor.

## 2020-09-09 ENCOUNTER — Encounter (HOSPITAL_COMMUNITY): Payer: Self-pay | Admitting: Radiology

## 2020-09-09 ENCOUNTER — Inpatient Hospital Stay (HOSPITAL_COMMUNITY): Payer: 59

## 2020-09-09 DIAGNOSIS — I9589 Other hypotension: Secondary | ICD-10-CM | POA: Diagnosis not present

## 2020-09-09 DIAGNOSIS — E1165 Type 2 diabetes mellitus with hyperglycemia: Secondary | ICD-10-CM | POA: Diagnosis not present

## 2020-09-09 DIAGNOSIS — N179 Acute kidney failure, unspecified: Secondary | ICD-10-CM | POA: Diagnosis not present

## 2020-09-09 DIAGNOSIS — K625 Hemorrhage of anus and rectum: Secondary | ICD-10-CM | POA: Diagnosis not present

## 2020-09-09 LAB — CBC WITH DIFFERENTIAL/PLATELET
Abs Immature Granulocytes: 0.84 10*3/uL — ABNORMAL HIGH (ref 0.00–0.07)
Basophils Absolute: 0 10*3/uL (ref 0.0–0.1)
Basophils Relative: 0 %
Eosinophils Absolute: 0 10*3/uL (ref 0.0–0.5)
Eosinophils Relative: 0 %
HCT: 29.7 % — ABNORMAL LOW (ref 36.0–46.0)
Hemoglobin: 9 g/dL — ABNORMAL LOW (ref 12.0–15.0)
Immature Granulocytes: 7 %
Lymphocytes Relative: 4 %
Lymphs Abs: 0.4 10*3/uL — ABNORMAL LOW (ref 0.7–4.0)
MCH: 29.7 pg (ref 26.0–34.0)
MCHC: 30.3 g/dL (ref 30.0–36.0)
MCV: 98 fL (ref 80.0–100.0)
Monocytes Absolute: 0.2 10*3/uL (ref 0.1–1.0)
Monocytes Relative: 2 %
Neutro Abs: 10 10*3/uL — ABNORMAL HIGH (ref 1.7–7.7)
Neutrophils Relative %: 87 %
Platelets: 88 10*3/uL — ABNORMAL LOW (ref 150–400)
RBC: 3.03 MIL/uL — ABNORMAL LOW (ref 3.87–5.11)
RDW: 17.4 % — ABNORMAL HIGH (ref 11.5–15.5)
WBC: 11.5 10*3/uL — ABNORMAL HIGH (ref 4.0–10.5)
nRBC: 1.4 % — ABNORMAL HIGH (ref 0.0–0.2)

## 2020-09-09 LAB — GLUCOSE, CAPILLARY
Glucose-Capillary: 220 mg/dL — ABNORMAL HIGH (ref 70–99)
Glucose-Capillary: 280 mg/dL — ABNORMAL HIGH (ref 70–99)
Glucose-Capillary: 315 mg/dL — ABNORMAL HIGH (ref 70–99)
Glucose-Capillary: 299 mg/dL — ABNORMAL HIGH (ref 70–99)
Glucose-Capillary: 316 mg/dL — ABNORMAL HIGH (ref 70–99)
Glucose-Capillary: 279 mg/dL — ABNORMAL HIGH (ref 70–99)

## 2020-09-09 LAB — BASIC METABOLIC PANEL
Anion gap: 6 (ref 5–15)
BUN: 15 mg/dL (ref 6–20)
CO2: 28 mmol/L (ref 22–32)
Calcium: 8.5 mg/dL — ABNORMAL LOW (ref 8.9–10.3)
Chloride: 101 mmol/L (ref 98–111)
Creatinine, Ser: 0.47 mg/dL (ref 0.44–1.00)
GFR, Estimated: >60 mL/min (ref >60)
Glucose, Bld: 291 mg/dL — ABNORMAL HIGH (ref 70–99)
Potassium: 3.8 mmol/L (ref 3.5–5.1)
Sodium: 135 mmol/L (ref 135–145)

## 2020-09-09 LAB — MAGNESIUM: Magnesium: 2.1 mg/dL (ref 1.7–2.4)

## 2020-09-09 LAB — PHOSPHORUS: Phosphorus: 2.9 mg/dL (ref 2.5–4.6)

## 2020-09-09 MED ORDER — INSULIN ASPART 100 UNIT/ML ~~LOC~~ SOLN
6.0000 [IU] | SUBCUTANEOUS | Status: AC
  Administered 2020-09-09 (×2): 6 [IU] via SUBCUTANEOUS

## 2020-09-09 MED ORDER — IOHEXOL 300 MG/ML  SOLN
100.0000 mL | Freq: Once | INTRAMUSCULAR | Status: AC | PRN
  Administered 2020-09-09: 100 mL via INTRAVENOUS

## 2020-09-09 MED ORDER — TRAVASOL 10 % IV SOLN
INTRAVENOUS | Status: AC
  Filled 2020-09-09: qty 951.6

## 2020-09-09 MED ORDER — INSULIN ASPART 100 UNIT/ML ~~LOC~~ SOLN
3.0000 [IU] | SUBCUTANEOUS | Status: DC
  Administered 2020-09-09 – 2020-09-11 (×10): 3 [IU] via SUBCUTANEOUS

## 2020-09-09 MED ORDER — IOHEXOL 9 MG/ML PO SOLN
ORAL | Status: AC
  Administered 2020-09-09: 1000 mL
  Filled 2020-09-09: qty 1000

## 2020-09-09 NOTE — Progress Notes (Signed)
PROGRESS NOTE    Rhonda [name]  WJX:[id number]  DOB: 10-Sep-1960  PCP: [place] date:08/29/2020 Chief compliant: Rectal bleeding 60 year old female with history of hypertension, obesity, obstructive sleep apnea not using CPAP, neurosarcoidosis on chronic prednisone therapy, wheelchair-bound due to chronic debility, recurrent GI bleeding with recent multiple hospitalizations presented back to the emergency room with frank rectal bleeding and hypotension.  Patient has had previous admissions (3/8 and 4/4-4/6) for hematochezia and underwent EGD, colonoscopy, capsule endoscopy-treated supportively and discharged home.  Hospital course:  On this presentation, patient reported hematochezia and underwent CT angiogram which revealed acute bleeding in the descending colon- coil embolization of the distal [place] branch of the superior sigmoidal artery. Insulin infusion for high blood sugars.  Admitted to ICU.  Patient eventually transferred out of ICU on 4/15.  However on 4/16, patient complained of severe abdominal pain after eating lunch and CT abdomen/pelvis showed visceral perforation.  She underwent urgent exploratory laparotomy and found to have necrotic sigmoid colon.  Status post Jeanette Caprice procedure with colostomy and wound VAC for midline incision.  She was started on TPN on 4/22  Subjective: Patient sleeping comfortably when seen in rounds this morning.  Per bedside nurse patient had an episode of small emesis yesterday but otherwise tolerating liquid diet well.  She apparently also received 1 dose of Dilaudid yesterday for complaints of pain after which her agitation and heart rate improved.    Objective: Vitals:   09/08/20 1550 09/08/20 1800 09/08/20 2005 09/09/20 0514  BP: 126/80 137/74 117/71 (!) 161/88  Pulse: (!) 111  (!) 114 (!) 106  Resp: 14 16 18 20   Temp: 98.2 F (36.8 C) 98.6 F (37 C) 97.8 F (36.6 C) 98.6 F (37 C)  TempSrc: Oral Axillary Oral Oral   SpO2: 97% 96% 98% 99%  Weight:      Height:        Intake/Output Summary (Last 24 hours) at 09/09/2020 1116 Last data filed at 09/09/2020 0802 Gross per 24 hour  Intake 2247.25 ml  Output 2250 ml  Net -2.75 ml   Filed Weights   09/06/20 0355 09/06/20 1201 09/08/20 0500  Weight: 87.6 kg 92.9 kg 90.3 kg    Physical Examination:  General: Moderately built, no acute respiratory distress but appears anxious, emotional and shouting. Head ENT: Atraumatic normocephalic, PERRLA, neck supple Heart: S1-S2 heard, regular rate and rhythm, no murmurs.  No leg edema noted Lungs: Equal air entry bilaterally, no rhonchi or rales on exam, no accessory muscle use Abdomen: Midline surgical incision clean, wound VAC in place.  Has left lower quadrant colostomy bag in place.  Abdomen today somewhat distended, tympanic.  Nontender Extremities: No pedal edema.  No cyanosis or clubbing. Neurological: Sleeping comfortably, no focal weakness Skin: Abdomen with midline surgical incision/wound VAC  Data Reviewed: I have personally reviewed following labs and imaging studies  CBC: Recent Labs  Lab 09/05/20 0111 09/06/20 0009 09/07/20 0444 09/08/20 0305 09/09/20 0354  WBC 8.1 6.5 7.8 10.0 11.5*  NEUTROABS 7.1 5.6 6.3 8.0* 10.0*  HGB 8.2* 9.4* 9.7* 10.3* 9.0*  HCT 25.7* 29.6* 31.9* 34.3* 29.7*  MCV 94.8 94.6 96.4 98.0 98.0  PLT 101* 114* 108* 115* 88*   Basic Metabolic Panel: Recent Labs  Lab 09/03/20 0146 09/04/20 0137 09/05/20 0111 09/06/20 0009 09/07/20 0444 09/08/20 0305 09/09/20 0354  NA 127* 129* 134*  --  141 139 135  K 4.1 3.6 3.2*  --  3.9 3.3* 3.8  CL 94* 97*  102  --  103 103 101  CO2 26 26 25   --  28 26 28   GLUCOSE 161* 173* 154*  --  274* 338* 291*  BUN 16 7 <5*  --  8 9 15   CREATININE 0.66 0.53 0.52  --  0.49 0.50 0.47  CALCIUM 8.0* 8.0* 7.9*  --  8.6* 8.9 8.5*  MG 1.6* 2.1 1.9 1.9 1.9 1.9 2.1  PHOS 3.5  --   --   --  2.3* 3.2 2.9   GFR: Estimated Creatinine Clearance:  80.8 mL/min (by C-G formula based on SCr of 0.47 mg/dL). Liver Function Tests: Recent Labs  Lab 09/07/20 0444  AST 15  ALT 27  ALKPHOS 217*  BILITOT 0.8  PROT 4.7*  ALBUMIN 2.2*   No results for input(s): LIPASE, AMYLASE in the last 168 hours. No results for input(s): AMMONIA in the last 168 hours. Coagulation Profile: No results for input(s): INR, PROTIME in the last 168 hours. Cardiac Enzymes: No results for input(s): CKTOTAL, CKMB, CKMBINDEX, TROPONINI in the last 168 hours. BNP (last 3 results) No results for input(s): PROBNP in the last 8760 hours. HbA1C: No results for input(s): HGBA1C in the last 72 hours. CBG: Recent Labs  Lab 09/08/20 2004 09/08/20 2351 09/09/20 0415 09/09/20 0755 09/09/20 1003  GLUCAP 364* 318* 220* 280* 315*   Lipid Profile: Recent Labs    09/07/20 0444  TRIG 120   Thyroid Function Tests: No results for input(s): TSH, T4TOTAL, FREET4, T3FREE, THYROIDAB in the last 72 hours. Anemia Panel: No results for input(s): VITAMINB12, FOLATE, FERRITIN, TIBC, IRON, RETICCTPCT in the last 72 hours. Sepsis Labs: No results for input(s): PROCALCITON, LATICACIDVEN in the last 168 hours.  Recent Results (from the past 240 hour(s))  Aerobic/Anaerobic Culture w Gram Stain (surgical/deep wound)     Status: None   Collection Time: 09/01/20 11:22 PM   Specimen: PATH Cytology Peritoneal fluid; Body Fluid  Result Value Ref Range Status   Specimen Description PERITONEAL FLUID  Final   Special Requests IN SWABS PATIENT ON FOLLOWING CIPRO,FLAGYL  Final   Gram Stain   Final    RARE WBC PRESENT, PREDOMINANTLY PMN NO ORGANISMS SEEN    Culture   Final    MIXED ANAEROBIC FLORA PRESENT.  CALL LAB IF FURTHER IID REQUIRED. NO GROWTH AEROBICALLY Performed at [facility] [address], [place], [place] [address]    Report Status 09/07/2020 FINAL  Final      Radiology Studies: No results found.    Scheduled Meds: . Chlorhexidine Gluconate Cloth  6  each Topical Daily  . feeding supplement  1 Container Oral TID BM  . gabapentin  100 mg Oral TID  . insulin aspart  0-20 Units Subcutaneous Q4H  . insulin aspart  3 Units Subcutaneous Q4H  . insulin aspart  6 Units Subcutaneous Q4H  . iohexol      . lidocaine  1 patch Transdermal Q24H  . mouth rinse  15 mL Mouth Rinse BID  . melatonin  9 mg Oral QHS  . metoprolol tartrate  25 mg Oral BID  . pantoprazole (PROTONIX) IV  40 mg Intravenous Q24H  . predniSONE  40 mg Oral BID   Continuous Infusions: . sodium chloride 45 mL/hr at 09/09/20 0641  . TPN ADULT (ION) 65 mL/hr at 09/09/20 0500  . TPN ADULT (ION)        Assessment/Plan:  1.  Perforated sigmoid colon: Underwent IMA embolization on 4/13 followed by emergent exploratory  laparotomy and Hartman's procedure on 4/16 for visceral perforation.  Postop course complicated by prolonged ileus and started on TPN due to lack of return of bowel function.  On liquid diet and tolerating okay but no bowel movement yet and abdomen appears somewhat distended today.  Was going to order KUB but noted that GS has ordered a CT with p.o. contrast.  Will follow-up results.  Per GS, okay to continue CLD today.  May need to minimize opiate use as discussed with bedside nurse.  2.  Rectal bleeding: Resolved.  Underwent IMA embolization on 413 for sigmoid bleeding-diverticular source likely.  Hemoglobin initially dropped but now uptrending.  Continue to monitor and transfuse as needed.  3.  AKI: In the setting of problem #1 and 2.  Now improved and to baseline renal function.  4.  Neurosarcoidosis, chronic confusion: Patient does have history of neurosarcoidosis and has chronic debility and emotional instability per previous progress notes.  She also has history of delirium and prior hospitalizations.  This morning her behavior appears to be more of a intellectual disability rather than delirium.  Continue melatonin at bedtime, Ativan as needed for anxiety.  On  prednisone 40 mg twice daily at baseline-received Solu-Medrol perioperatively and now back on oral prednisone/gabapentin.  5.  Diabetes mellitus type 2, uncontrolled with hyperglycemia: Patient now also on TPN and her insulin is being adjusted via TPN by pharmacy.  Her blood glucose did go up to 400 today-will defer management to pharmacy for now.  Also on short-acting scheduled insulin, sliding scale.  6.  Hypomagnesemia/hypokalemia:  IV magnesium yesterday and improved from 1.9->2.1.  Replace p.o. potassium as needed.  7.  Hypertension: Continue metoprolol.  8.  Obstructive sleep apnea: Noncompliant with CPAP use, declined here.  DVT prophylaxis: SCDs in the setting of problem #2 Code Status: Full code Family / Patient Communication: Bonne Dolores calling husband Laurena Valko  at [phone], no answer, left a message. Disposition Plan:   Status is: Inpatient  Remains inpatient appropriate because: Altered mental status, IV treatments appropriate due to intensity of illness or inability to take PO and Inpatient level of care appropriate due to severity of illness   Dispo: The patient is from: Home              Anticipated d/c is to: SNF              Patient currently is not medically stable to d/c.   Difficult to place patient No     Time spent: 25 MINS     >50% time spent in discussions with care team and coordination of care.    Guilford Shi, MD [facility] Pager in [place]  If 7PM-7AM, please contact night-coverage [email or web address] 09/09/2020, 11:16 AM

## 2020-09-09 NOTE — Progress Notes (Signed)
   09/09/20 1941  Assess: MEWS Score  Temp 97.9 F (36.6 C)  BP 134/79  Pulse Rate (!) 116  Resp 19  Level of Consciousness Alert  SpO2 98 %  O2 Device Room Air  Patient Activity (if Appropriate) In bed  Assess: MEWS Score  MEWS Temp 0  MEWS Systolic 0  MEWS Pulse 2  MEWS RR 0  MEWS LOC 0  MEWS Score 2  MEWS Score Color Yellow  Assess: if the MEWS score is Yellow or Red  Were vital signs taken at a resting state? Yes  Focused Assessment No change from prior assessment  Early Detection of Sepsis Score *See Row Information* Medium  MEWS guidelines implemented *See Row Information* No, previously yellow, continue vital signs every 4 hours  Treat  Pain Scale 0-10  Pain Score 0  Take Vital Signs  Increase Vital Sign Frequency  Yellow: Q 2hr X 2 then Q 4hr X 2, if remains yellow, continue Q 4hrs  Escalate  MEWS: Escalate Yellow: discuss with charge nurse/RN and consider discussing with provider and RRT  Notify: Charge Nurse/RN  Name of Charge Nurse/RN Notified Nikki RN  Date Charge Nurse/RN Notified 09/09/20  Time Charge Nurse/RN Notified 1943  Document  Patient Outcome Other (Comment) (no interventions, continue to monitor)  Progress note created (see row info) Yes   Charge RN aware, will continue to monitor.

## 2020-09-09 NOTE — Progress Notes (Signed)
   09/09/20 1751  Assess: MEWS Score  Temp 98.7 F (37.1 C)  BP (!) 156/85  Pulse Rate (!) 107  ECG Heart Rate (!) 115  Resp 18  Assess: MEWS Score  MEWS Temp 0  MEWS Systolic 0  MEWS Pulse 2  MEWS RR 0  MEWS LOC 0  MEWS Score 2  MEWS Score Color Yellow  Assess: if the MEWS score is Yellow or Red  Were vital signs taken at a resting state? Yes  Focused Assessment No change from prior assessment  Early Detection of Sepsis Score *See Row Information* Medium  MEWS guidelines implemented *See Row Information* No, vital signs rechecked  Treat  Pain Score Asleep  Take Vital Signs  Increase Vital Sign Frequency  Yellow: Q 2hr X 2 then Q 4hr X 2, if remains yellow, continue Q 4hrs  Escalate  MEWS: Escalate Yellow: discuss with charge nurse/RN and consider discussing with provider and RRT  Notify: Charge Nurse/RN  Name of Charge Nurse/RN Notified [place]  Date Charge Nurse/RN Notified 09/09/20  Time Charge Nurse/RN Notified 1751

## 2020-09-09 NOTE — Progress Notes (Signed)
8 Days Post-Op  Subjective: CC: Coherent this morning. Slept well.. Denies pain or nausea but complains of bloating. No ostomy output.   Objective: Vital signs in last 24 hours: Temp:  [97.8 F (36.6 C)-98.8 F (37.1 C)] 98.6 F (37 C) (04/24 0514) Pulse Rate:  [106-116] 106 (04/24 0514) Resp:  [14-20] 20 (04/24 0514) BP: (117-161)/(71-96) 161/88 (04/24 0514) SpO2:  [96 %-100 %] 99 % (04/24 0514) Last BM Date: 09/01/20  Intake/Output from previous day: 04/23 0701 - 04/24 0700 In: 2487.3 [P.O.:840; I.V.:1647.3] Out: 1300 [Urine:1300] Intake/Output this shift: Total I/O In: -  Out: 950 [Urine:950]  PE: Gen: Alert, cooperative Heart: regular  Pulm:CTA b/l. Normal rate and effort Abd: obese, soft, distended, nontender. Wound vac in place. Stoma difficult to visualize but appears to have some necrosis/sloughing.  Msk: Trace pedal edema b/l. SCD's in place     Lab Results:  Recent Labs    09/08/20 0305 09/09/20 0354  WBC 10.0 11.5*  HGB 10.3* 9.0*  HCT 34.3* 29.7*  PLT 115* 88*   BMET Recent Labs    09/08/20 0305 09/09/20 0354  NA 139 135  K 3.3* 3.8  CL 103 101  CO2 26 28  GLUCOSE 338* 291*  BUN 9 15  CREATININE 0.50 0.47  CALCIUM 8.9 8.5*   PT/INR No results for input(s): LABPROT, INR in the last 72 hours. CMP     Component Value Date/Time   NA 135 09/09/2020 0354   NA 129 (L) 08/06/2020 1003   K 3.8 09/09/2020 0354   CL 101 09/09/2020 0354   CO2 28 09/09/2020 0354   GLUCOSE 291 (H) 09/09/2020 0354   BUN 15 09/09/2020 0354   BUN 6 08/06/2020 1003   CREATININE 0.47 09/09/2020 0354   CREATININE 0.85 08/02/2015 1710   CALCIUM 8.5 (L) 09/09/2020 0354   PROT 4.7 (L) 09/07/2020 0444   PROT 4.9 (L) 08/06/2020 1003   ALBUMIN 2.2 (L) 09/07/2020 0444   ALBUMIN 3.2 (L) 08/06/2020 1003   AST 15 09/07/2020 0444   ALT 27 09/07/2020 0444   ALKPHOS 217 (H) 09/07/2020 0444   BILITOT 0.8 09/07/2020 0444   BILITOT 0.4 08/06/2020 1003   GFRNONAA  >60 09/09/2020 0354   GFRNONAA 78 08/02/2015 1710   GFRAA >60 01/17/2020 0546   GFRAA >89 08/02/2015 1710   Lipase     Component Value Date/Time   LIPASE 64 (H) 07/23/2020 0432       Studies/Results: No results found.  Anti-infectives: Anti-infectives (From admission, onward)   Start     Dose/Rate Route Frequency Ordered Stop   09/01/20 1930  ciprofloxacin (CIPRO) IVPB 400 mg        400 mg 200 mL/hr over 60 Minutes Intravenous Every 12 hours 09/01/20 1836 09/05/20 1927   09/01/20 1930  metroNIDAZOLE (FLAGYL) IVPB 500 mg        500 mg 100 mL/hr over 60 Minutes Intravenous Every 8 hours 09/01/20 1836 09/05/20 2124       Assessment/Plan Hx of HTN DM2 Neurosarcoidosis/chronic debility and wheelchair-bound status- on chronic steroids.  OSA ABL anemia  Rhonda [name] is a 60 year old female with a history of GI bleed s/p angioembolization of a branch of the IMA on 4/13, who developed a necrotic section of proximal sigmoid colon with perforation and feculent peritonitis - S/pExploratory Laparotomy, Partial Colectomy (sigmoid), colostomy creation,Hartman's procedure on 4/16/2022by Dr. Grandville Silos - POD #8 -Ileus post-op. Keep on CLD and TPN.  CT with PO contrast today.  -  Completed 4 days post op abx due to feculent peritonitis. Afebrile and wbc wnl (on chronic steroids).  -Continue PT/OT, mobilize - recommending SNF - WOC following for ostomy education -Vac changes MWF - Pulm toilet  FEN -CLOD, IVF per TRH, start TPN VTE -SCDs,okay for chemical prophylaxis from a general surgery standpoint ID -Cipro/Flagyl4/16 - 4/20. None currently Foley - None   LOS: 11 days      Clovis Riley , [facility] 09/09/2020, 8:05 AM Please see Amion for pager number during day hours 7:00am-4:30pm

## 2020-09-09 NOTE — Progress Notes (Signed)
   09/08/20 1550  Assess: MEWS Score  Temp 98.2 F (36.8 C)  BP 126/80  Pulse Rate (!) 111  ECG Heart Rate (!) 111  Resp 14  Level of Consciousness Alert  SpO2 97 %  O2 Device Nasal Cannula  O2 Flow Rate (L/min) 2 L/min  Assess: MEWS Score  MEWS Temp 0  MEWS Systolic 0  MEWS Pulse 2  MEWS RR 0  MEWS LOC 0  MEWS Score 2  MEWS Score Color Yellow  Assess: if the MEWS score is Yellow or Red  Were vital signs taken at a resting state? Yes  Focused Assessment No change from prior assessment  Early Detection of Sepsis Score *See Row Information* Low  MEWS guidelines implemented *See Row Information* Yes  Treat  Pain Scale 0-10  Pain Score 0  Take Vital Signs  Increase Vital Sign Frequency  Yellow: Q 2hr X 2 then Q 4hr X 2, if remains yellow, continue Q 4hrs  Escalate  MEWS: Escalate Yellow: discuss with charge nurse/RN and consider discussing with provider and RRT  Notify: Charge Nurse/RN  Name of Charge Nurse/RN Notified Liana, RN  Date Charge Nurse/RN Notified 09/09/20  Time Charge Nurse/RN Notified 1550

## 2020-09-09 NOTE — Progress Notes (Signed)
PHARMACY - TOTAL PARENTERAL NUTRITION CONSULT NOTE   Indication: Prolonged ileus  Patient Measurements: Height: 5\' 3"  (160 cm) Weight: 90.3 kg (199 lb 1.2 oz) IBW/kg (Calculated) : 52.4 TPN AdjBW (KG): 59.2 Body mass index is 35.26 kg/m.  Assessment: [name] who presented with necrotic section of proximal sigmoid colon with perforation and feculent peritonitis now s/p ex lap with partial sigmoid colectomy and colostomy and Hartman's procedure on 09/01/20. Developed ileus post op and has remained NPO. Pharmacy consulted to start TPN.   Glucose / Insulin: A1c 11.8. CBGs 220-364 on rSSI + 3 units q4h, did receive lantus 10 units x 1 on 4/23 AM has required 51 units SSI/12h since new TPN bag hung with 45 units insulin R. The patient has been resumed on her PTA prednisone dosing of 40 mg bid in the setting of neurosarcoidosis. Elevated CBGs will prevent further titration in TPN - will increase insulin R in TPN bag and adjust insulin orders to better manage in the short term.   Electrolytes: Na 135 (corrects to 138-140 in setting of hyperglycemia), K 3.8, Mg 2.1, Phos 2.9, Cl/HCO3 wnl Renal: SCr <1, BUN 15, pre-albumin 19.4 (4/22) Hepatic: AST/ALT and Tbili wnl. Alk Phos 217 Intake / Output; MIVF: UOP 0.6 ml/kg/hr, Net I/O: -0.7L, colostomy 0 cc/24h GI Imaging: 4/16: SBO with evidence of perforation and free fluid in the abdomen. 4/24 CT:  GI Surgeries / Procedures:  4/16: ex lap with partial sigmoid colectomy and colostomy and Hartman's procedure  Central access: PICC line ordered for today  TPN start date: 09/06/20   Nutritional Goals: Per RD recommendations on 4/22 1900-2100 Kcal, 110-125 g protein Goal rate of 85 ml/hr will provide 1902 kcal, 124g protein  Current Nutrition:  TPN Clear liquids - breeze tid between meals ordered  Plan:  Continue TPN at 65 ml/hr to provide 1455 kcal, 220g CHO, 95g protein - no planned advancement again today due to elevated CBGs Electrolytes in TPN (no  adjustments 4/24): Na 52mEq/L, K 50 mEq/L, Ca 54mEq/L, Mg 7 mEq/L, and Phos 18mmol/L. Cl:Ac 1:1 Add standard MVI and trace elements to TPN Increase to 80 units insulin R to TPN bag Continue with resistant SSI and adjust to 6 units of additional scheduled novolog q4h until new bag hung this evening - then back to 3 units Continue MIVF at 45 mL/hr at 1800 Monitor TPN labs on Mon/Thurs  Thank you for allowing pharmacy to be a part of this patient's care.  Alycia Rossetti, PharmD, BCPS Clinical Pharmacist Clinical phone for 09/09/2020: [phone] 09/09/2020 8:10 AM   **Pharmacist phone directory can now be found on [email or web address] ([email or web address]).  Listed under [place].

## 2020-09-10 ENCOUNTER — Inpatient Hospital Stay (HOSPITAL_COMMUNITY): Payer: 59

## 2020-09-10 DIAGNOSIS — K625 Hemorrhage of anus and rectum: Secondary | ICD-10-CM | POA: Diagnosis not present

## 2020-09-10 DIAGNOSIS — E861 Hypovolemia: Secondary | ICD-10-CM | POA: Diagnosis not present

## 2020-09-10 DIAGNOSIS — I9589 Other hypotension: Secondary | ICD-10-CM | POA: Diagnosis not present

## 2020-09-10 LAB — CBC WITH DIFFERENTIAL/PLATELET
Abs Immature Granulocytes: 0 10*3/uL (ref 0.00–0.07)
Basophils Absolute: 0 10*3/uL (ref 0.0–0.1)
Basophils Relative: 0 %
Eosinophils Absolute: 0.1 10*3/uL (ref 0.0–0.5)
Eosinophils Relative: 1 %
HCT: 30.1 % — ABNORMAL LOW (ref 36.0–46.0)
Hemoglobin: 9.1 g/dL — ABNORMAL LOW (ref 12.0–15.0)
Lymphocytes Relative: 1 %
Lymphs Abs: 0.1 10*3/uL — ABNORMAL LOW (ref 0.7–4.0)
MCH: 29.6 pg (ref 26.0–34.0)
MCHC: 30.2 g/dL (ref 30.0–36.0)
MCV: 98 fL (ref 80.0–100.0)
Monocytes Absolute: 0 10*3/uL — ABNORMAL LOW (ref 0.1–1.0)
Monocytes Relative: 0 %
Neutro Abs: 11.2 10*3/uL — ABNORMAL HIGH (ref 1.7–7.7)
Neutrophils Relative %: 98 %
Platelets: 73 10*3/uL — ABNORMAL LOW (ref 150–400)
RBC: 3.07 MIL/uL — ABNORMAL LOW (ref 3.87–5.11)
RDW: 17.5 % — ABNORMAL HIGH (ref 11.5–15.5)
WBC: 11.4 10*3/uL — ABNORMAL HIGH (ref 4.0–10.5)
nRBC: 2.2 % — ABNORMAL HIGH (ref 0.0–0.2)
nRBC: 5 /100 WBC — ABNORMAL HIGH

## 2020-09-10 LAB — COMPREHENSIVE METABOLIC PANEL
ALT: 23 U/L (ref 0–44)
AST: 20 U/L (ref 15–41)
Albumin: 2.1 g/dL — ABNORMAL LOW (ref 3.5–5.0)
Alkaline Phosphatase: 175 U/L — ABNORMAL HIGH (ref 38–126)
Anion gap: 4 — ABNORMAL LOW (ref 5–15)
BUN: 13 mg/dL (ref 6–20)
CO2: 33 mmol/L — ABNORMAL HIGH (ref 22–32)
Calcium: 8.3 mg/dL — ABNORMAL LOW (ref 8.9–10.3)
Chloride: 102 mmol/L (ref 98–111)
Creatinine, Ser: 0.36 mg/dL — ABNORMAL LOW (ref 0.44–1.00)
GFR, Estimated: >60 mL/min (ref >60)
Glucose, Bld: 223 mg/dL — ABNORMAL HIGH (ref 70–99)
Potassium: 3.5 mmol/L (ref 3.5–5.1)
Sodium: 139 mmol/L (ref 135–145)
Total Bilirubin: 0.5 mg/dL (ref 0.3–1.2)
Total Protein: 4.3 g/dL — ABNORMAL LOW (ref 6.5–8.1)

## 2020-09-10 LAB — GLUCOSE, CAPILLARY
Glucose-Capillary: 203 mg/dL — ABNORMAL HIGH (ref 70–99)
Glucose-Capillary: 209 mg/dL — ABNORMAL HIGH (ref 70–99)
Glucose-Capillary: 221 mg/dL — ABNORMAL HIGH (ref 70–99)
Glucose-Capillary: 225 mg/dL — ABNORMAL HIGH (ref 70–99)
Glucose-Capillary: 242 mg/dL — ABNORMAL HIGH (ref 70–99)
Glucose-Capillary: 270 mg/dL — ABNORMAL HIGH (ref 70–99)

## 2020-09-10 LAB — TRIGLYCERIDES: Triglycerides: 144 mg/dL (ref <150)

## 2020-09-10 LAB — PREALBUMIN: Prealbumin: 27 mg/dL (ref 18–38)

## 2020-09-10 LAB — PHOSPHORUS: Phosphorus: 2.1 mg/dL — ABNORMAL LOW (ref 2.5–4.6)

## 2020-09-10 LAB — MAGNESIUM: Magnesium: 2 mg/dL (ref 1.7–2.4)

## 2020-09-10 MED ORDER — POTASSIUM ACETATE 2 MEQ/ML IV SOLN
INTRAVENOUS | Status: AC
Start: 2020-09-10 — End: 2020-09-11
  Filled 2020-09-10: qty 999

## 2020-09-10 MED ORDER — POTASSIUM CHLORIDE 10 MEQ/50ML IV SOLN
10.0000 meq | INTRAVENOUS | Status: AC
  Administered 2020-09-10 (×2): 10 meq via INTRAVENOUS
  Filled 2020-09-10 (×2): qty 50

## 2020-09-10 MED ORDER — METHYLPREDNISOLONE SODIUM SUCC 40 MG IJ SOLR
40.0000 mg | Freq: Two times a day (BID) | INTRAMUSCULAR | Status: DC
  Administered 2020-09-10 – 2020-09-17 (×14): 40 mg via INTRAVENOUS
  Filled 2020-09-10 (×15): qty 1

## 2020-09-10 MED ORDER — POTASSIUM PHOSPHATES 15 MMOLE/5ML IV SOLN
20.0000 mmol | Freq: Once | INTRAVENOUS | Status: AC
  Administered 2020-09-10: 20 mmol via INTRAVENOUS
  Filled 2020-09-10: qty 6.67

## 2020-09-10 NOTE — Progress Notes (Signed)
Progress Note  9 Days Post-Op  Subjective: Patient reports she feels like she is having significant gas pains. Feels stuck and like she is unable to move anything through. She is nauseated but has not vomited at all. She does not get out of bed generally.   Objective: Vital signs in last 24 hours: Temp:  [97.9 F (36.6 C)-98.7 F (37.1 C)] 98.7 F (37.1 C) (04/25 0811) Pulse Rate:  [107-122] 122 (04/25 0811) Resp:  [18-19] 18 (04/25 0811) BP: (112-156)/(76-85) 112/78 (04/25 0811) SpO2:  [98 %-100 %] 100 % (04/25 0811) Last BM Date: 09/01/20  Intake/Output from previous day: 04/24 0701 - 04/25 0700 In: -  Out: 2175 [Urine:1975; Drains:200] Intake/Output this shift: No intake/output data recorded.  PE: General: pleasant, WD, obese female who is laying in bed in NAD Heart: regular, rate, and rhythm.   Lungs: CTAB, no wheezes, rhonchi, or rales noted.  Respiratory effort nonlabored Abd: soft, appropriately ttp, distended, BS hypoactive, midline wound pale but without purulence, stoma retracted and with some separation inferiorly but pink viable tissue, digitized stoma and fascia feels a little tight around, some hard stool present       Lab Results:  Recent Labs    09/09/20 0354 09/10/20 0355  WBC 11.5* 11.4*  HGB 9.0* 9.1*  HCT 29.7* 30.1*  PLT 88* 73*   BMET Recent Labs    09/09/20 0354 09/10/20 0355  NA 135 139  K 3.8 3.5  CL 101 102  CO2 28 33*  GLUCOSE 291* 223*  BUN 15 13  CREATININE 0.47 0.36*  CALCIUM 8.5* 8.3*   PT/INR No results for input(s): LABPROT, INR in the last 72 hours. CMP     Component Value Date/Time   NA 139 09/10/2020 0355   NA 129 (L) 08/06/2020 1003   K 3.5 09/10/2020 0355   CL 102 09/10/2020 0355   CO2 33 (H) 09/10/2020 0355   GLUCOSE 223 (H) 09/10/2020 0355   BUN 13 09/10/2020 0355   BUN 6 08/06/2020 1003   CREATININE 0.36 (L) 09/10/2020 0355   CREATININE 0.85 08/02/2015 1710   CALCIUM 8.3 (L) 09/10/2020 0355   PROT  4.3 (L) 09/10/2020 0355   PROT 4.9 (L) 08/06/2020 1003   ALBUMIN 2.1 (L) 09/10/2020 0355   ALBUMIN 3.2 (L) 08/06/2020 1003   AST 20 09/10/2020 0355   ALT 23 09/10/2020 0355   ALKPHOS 175 (H) 09/10/2020 0355   BILITOT 0.5 09/10/2020 0355   BILITOT 0.4 08/06/2020 1003   GFRNONAA >60 09/10/2020 0355   GFRNONAA 78 08/02/2015 1710   GFRAA >60 01/17/2020 0546   GFRAA >89 08/02/2015 1710   Lipase     Component Value Date/Time   LIPASE 64 (H) 07/23/2020 0432       Studies/Results: CT ABDOMEN PELVIS W CONTRAST  Result Date: 09/09/2020 CLINICAL DATA:  Abdominal bloating and vomiting. Recent sigmoid resection for necrotic bowel perforation. EXAM: CT ABDOMEN AND PELVIS WITH CONTRAST TECHNIQUE: Multidetector CT imaging of the abdomen and pelvis was performed using the standard protocol following bolus administration of intravenous contrast. CONTRAST:  165mL OMNIPAQUE IOHEXOL 300 MG/ML  SOLN COMPARISON:  09/01/2020 FINDINGS: Lower Chest: Stable mild right basilar atelectasis. Hepatobiliary: No hepatic masses identified. Focal fatty infiltration again seen in the central left lobe adjacent to the porta hepatis. Gallbladder is unremarkable. No evidence of biliary ductal dilatation. Pancreas:  No mass or inflammatory changes. Spleen: Within normal limits in size and appearance. Adrenals/Urinary Tract: No masses identified. Small left renal cyst  noted. No evidence of ureteral calculi or hydronephrosis. Stomach/Bowel: Patient has undergone sigmoid colon resection with left lower quadrant colostomy since previous study. Moderate to severe dilatation of stomach and proximal small bowel is seen containing air-fluid levels. Transition point is seen in the right lateral abdomen adjacent to the ascending colon, and this is suspicious for adhesion. There is no evidence of focal inflammatory process, abscess, or free intraperitoneal air. Tiny amount of free fluid seen in the left paracolic gutter. Normal appendix  visualized. Right-sided colonic diverticulosis is noted, however there is no evidence of diverticulitis. Vascular/Lymphatic: No pathologically enlarged lymph nodes. No acute vascular findings. Aortic atherosclerotic calcification noted. Reproductive: Prior hysterectomy noted. Adnexal regions are unremarkable in appearance. Other:  None. Musculoskeletal:  No suspicious bone lesions identified. IMPRESSION: Small bowel obstruction, with transition point in right lateral abdomen suspicious for adhesion. 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 Right-sided colonic diverticulosis, without radiographic evidence of diverticulitis. Mild right basilar atelectasis. Aortic Atherosclerosis (ICD10-I70.0). Electronically Signed   By: Marlaine Hind M.D.   On: 09/09/2020 16:29    Anti-infectives: Anti-infectives (From admission, onward)   Start     Dose/Rate Route Frequency Ordered Stop   09/01/20 1930  ciprofloxacin (CIPRO) IVPB 400 mg        400 mg 200 mL/hr over 60 Minutes Intravenous Every 12 hours 09/01/20 1836 09/05/20 1927   09/01/20 1930  metroNIDAZOLE (FLAGYL) IVPB 500 mg        500 mg 100 mL/hr over 60 Minutes Intravenous Every 8 hours 09/01/20 1836 09/05/20 2124       Assessment/Plan Hx of HTN DM2 Neurosarcoidosis/chronic debility and wheelchair-bound status- on chronic steroids.  OSA ABL anemia  Ms. Ensz is a 60 year old female with a history of GI bleed s/p angioembolization of a branch of the IMA on 4/13, who developed a necrotic section of proximal sigmoid colon with perforation and feculent peritonitis - S/pExploratory Laparotomy, Partial Colectomy (sigmoid), colostomy creation,Hartman's procedure on 4/16/2022by Dr. Grandville Silos - POD #9 -Ileus post-op.CT yesterday shows possible SBO. Patient with small amt stool in ostomy appliance but distended and nauseated. Recommend NGT placement and continue TPN - continue stomal suppository, some separation of stoma  inferiorly and tight at fascial level - continue to monitor closely  -Completed4 days post opabxdue to feculent peritonitis. Afebrile and wbc wnl (on chronic steroids). -Continue PT/OT, mobilize - recommending SNF - WOC following for ostomy education -Vac changes MWF - Pulm toilet  FEN -TPN, NPO, NGT to LIWS VTE -SCDs,okay for chemical prophylaxis from a general surgery standpoint ID -Cipro/Flagyl4/16- 4/20. None currently Foley - None  LOS: 12 days    Norm Parcel, [facility] Surgery 09/10/2020, 11:44 AM Please see Amion for pager number during day hours 7:00am-4:30pm

## 2020-09-10 NOTE — Consult Note (Signed)
WOC Nurse Consult Note: Reason for Consult:midline abdominal surgical wound VAC change Wound type:surgical Pressure Injury POA: Yes/No/NA Measurement:21 cm x 4 cm x 3 cm  Wound FYT:WKMQKM adipose  Pale wound bed with no new granulation tissue noted Drainage (amount, consistency, odor) minimal serosanguinous  No odor.  Periwound:LUQ colostomy Dressing procedure/placement/frequency: 2 pieces black foam. Covered with drape. Seal immediately achieved.  Change MOn/Wed/Fri WOC Nurse ostomy follow up Stoma type/location: LUQ colostomy  Stomal assessment/size: oval stoma is dusky, moist scant dark stool noted at os.  Peristomal assessment: Separation noted from 6 to 8 oclock.   Treatment options for stomal/peristomal skin: barrier ring and 1 piece convex pouch Output scant dark stool  Ostomy pouching: 1pc.convex Education provided: patient not able to participate in learning  Is nauseated and may require NG tube this AM.  Enrolled patient in [employer] Discharge program: No Will follow.  Domenic Moras MSN, RN, FNP-BC CWON Wound, Ostomy, Continence Nurse Pager [phone]

## 2020-09-10 NOTE — Progress Notes (Signed)
PROGRESS NOTE    Rhonda [name]  IRW:[id number] DOB: 1960/06/04 DOA: 08/29/2020 PCP: [facility].    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   Significant events: 3/8, admission with rectal bleeding, EGD and colonoscopy negative.   4/4-4/6 admitted with hematochezia, symptomatically treated, capsule endoscopy and discharged home. 4/13 presented with hematochezia, CT angiogram with acute bleeding in the descending colon status post coil embolization of the distal [place] branch of the superior sigmoidal artery. Insulin infusion for high blood sugars.  Admitted to ICU. 4/15, transferred out of ICU.  4/16, afternoon after eating lunch, patient had severe abdominal pain.  Relieved with pain medication.  A CT scan showed visceral perforation.  Underwent urgent exploratory laparotomy and found to have necrotic sigmoid colon.  Status post Jeanette Caprice procedure with colostomy and wound VAC for midline incision. 4/22, no return of bowel function yet.  Started on TPN.  Continues to have ileus, NG tube decompression 4/25.   Assessment & Plan:   Active Problems:   GI bleed   Hypotension   Rectal bleeding   AKI (acute kidney injury) ([place])  Perforated sigmoid colon with history of diverticulosis and multiple procedures.  IMA embolization 4/13. Emergency exploratory laparotomy and Hartman's procedure 4/16. Prolonged ileus.  No return of bowel function yet. Continues ileus, NG tube for decompression 4/25. Followed by surgery.  Increasing mobility as much. Peritoneal cultures negative.  Completed antibiotic therapy. PICC line and TPN.  Fluid management, electrolytes replaced by pharmacy as  per TPN protocol.  Acute lower GI bleeding, diverticular bleeding.  Hemorrhagic shock secondary to acute GI bleeding and anemia of blood loss: Hemoglobin is stable since surgery.  Acute renal failure: Due to #1.  Improved and normalized.  On maintenance IV fluid.  Type 2 diabetes with hyperglycemia: On TPN.  On increasing dose of insulin.  Neurosarcoidosis/chronic debility and wheelchair-bound status:  Patient on prednisone 40 mg twice a day at home.  Given Solu-Medrol perioperative.  Unreliable oral intake, will change to IV Solu-Medrol 40 mg twice a day until she has reliable oral intake to avoid corticosteroid insufficiency. Occasional delirium, symptomatic treatment with Ativan.  Melatonin at night.  Obstructive sleep apnea: Declined CPAP.  Not using.  Hypertension: On metoprolol.  Heart rate acceptable.  If unable to use by mouth, will use as needed IV metoprolol.  Hypochloremic hyponatremia: Normalizing.   Hypomagnesemia/hypokalemia: Replaced by pharmacy per TPN protocol.  DVT prophylaxis: SCDs Start: 08/29/20 2256   Code Status: Full code Family Communication: None at bedside today. Disposition Plan: Status is: Inpatient  Remains inpatient appropriate because:Inpatient level of care appropriate due to severity of illness   Dispo: The patient is from: Home              Anticipated d/c is to: Skilled nursing facility.              Patient currently is not medically stable to d/c.   Difficult to place patient no         Consultants:   PCCM  Gastroenterology  Interventional radiology  Surgery.  Procedures:   Embolization inferior mesenteric artery 4/13  Hartman's procedure 4/16.  Antimicrobials:  Antibiotics Given (last 72 hours)    None       Subjective: Patient seen and examined  in the morning rounds.  Feels gas pain and bloated.  Slept okay last night.  There was no family member at the bedside.   Objective: Vitals:   09/10/20 0000  09/10/20 0406 09/10/20 0811 09/10/20 1230  BP: 123/80  112/78 110/79  Pulse: (!) 119  (!) 122 (!) 110  Resp: 19 19 18 18   Temp: 97.9 F (36.6 C) 98 F (36.7 C) 98.7 F (37.1 C)   TempSrc: Axillary Axillary Oral   SpO2: 99%  100% 99%  Weight:      Height:        Intake/Output Summary (Last 24 hours) at 09/10/2020 1449 Last data filed at 09/09/2020 2047 Gross per 24 hour  Intake --  Output 600 ml  Net -600 ml   Filed Weights   09/06/20 0355 09/06/20 1201 09/08/20 0500  Weight: 87.6 kg 92.9 kg 90.3 kg    Examination: General: Frail and debilitated.  Slightly anxious and impulsive. Cardiovascular: S1-S2 normal.  No edema.  No added sounds. Respiratory: No added sounds. Gastrointestinal: Soft, distended and tympanitic.  Bowel sounds absent.  Colostomy with no stool.  Midline surgical incision clean and dry. Ext: No edema.  No joint swelling. Neuro: Patient has cushingoid face, she does not have any focal deficit, however she has generalized weakness and unable to bear weight on lower extremities due to chronic generalized weakness.   Data Reviewed: I have personally reviewed following labs and imaging studies  CBC: Recent Labs  Lab 09/06/20 0009 09/07/20 0444 09/08/20 0305 09/09/20 0354 09/10/20 0355  WBC 6.5 7.8 10.0 11.5* 11.4*  NEUTROABS 5.6 6.3 8.0* 10.0* 11.2*  HGB 9.4* 9.7* 10.3* 9.0* 9.1*  HCT 29.6* 31.9* 34.3* 29.7* 30.1*  MCV 94.6 96.4 98.0 98.0 98.0  PLT 114* 108* 115* 88* 73*   Basic Metabolic Panel: Recent Labs  Lab 09/05/20 0111 09/06/20 0009 09/07/20 0444 09/08/20 0305 09/09/20 0354 09/10/20 0355  NA 134*  --  141 139 135 139  K 3.2*  --  3.9 3.3* 3.8 3.5  CL 102  --  103 103 101 102  CO2 25  --  28 26 28  33*  GLUCOSE 154*  --  274* 338* 291* 223*  BUN <5*  --  8 9 15 13   CREATININE 0.52  --  0.49 0.50 0.47 0.36*  CALCIUM 7.9*  --  8.6* 8.9 8.5* 8.3*  MG 1.9 1.9 1.9 1.9 2.1 2.0  PHOS  --   --  2.3* 3.2 2.9 2.1*   GFR: Estimated  Creatinine Clearance: 80.8 mL/min (A) (by C-G formula based on SCr of 0.36 mg/dL (L)). Liver Function Tests: Recent Labs  Lab 09/07/20 0444 09/10/20 0355  AST 15 20  ALT 27 23  ALKPHOS 217* 175*  BILITOT 0.8 0.5  PROT 4.7* 4.3*  ALBUMIN 2.2* 2.1*   No results for input(s): LIPASE, AMYLASE in the last 168 hours. No results for input(s): AMMONIA in the last 168 hours. Coagulation Profile: No results for input(s): INR, PROTIME in the last 168 hours. Cardiac Enzymes: No results for input(s): CKTOTAL, CKMB, CKMBINDEX, TROPONINI in the last 168 hours. BNP (last 3 results) No results for input(s): PROBNP in the last 8760 hours. HbA1C: No results for input(s): HGBA1C in the last 72 hours. CBG: Recent Labs  Lab 09/09/20 1932 09/10/20 0007 09/10/20 0322 09/10/20 0743 09/10/20 1252  GLUCAP 279* 242* 209* 225* 203*   Lipid Profile: Recent Labs    09/10/20 0355  TRIG 144   Thyroid Function Tests:  No results for input(s): TSH, T4TOTAL, FREET4, T3FREE, THYROIDAB in the last 72 hours. Anemia Panel: No results for input(s): VITAMINB12, FOLATE, FERRITIN, TIBC, IRON, RETICCTPCT in the last 72 hours. Sepsis Labs: No results for input(s): PROCALCITON, LATICACIDVEN in the last 168 hours.  Recent Results (from the past 240 hour(s))  Aerobic/Anaerobic Culture w Gram Stain (surgical/deep wound)     Status: None   Collection Time: 09/01/20 11:22 PM   Specimen: PATH Cytology Peritoneal fluid; Body Fluid  Result Value Ref Range Status   Specimen Description PERITONEAL FLUID  Final   Special Requests IN SWABS PATIENT ON FOLLOWING CIPRO,FLAGYL  Final   Gram Stain   Final    RARE WBC PRESENT, PREDOMINANTLY PMN NO ORGANISMS SEEN    Culture   Final    MIXED ANAEROBIC FLORA PRESENT.  CALL LAB IF FURTHER IID REQUIRED. NO GROWTH AEROBICALLY Performed at [facility] [address] [place], [place] [address]    Report Status 09/07/2020 FINAL  Final         Radiology  Studies: DG Abd 1 View  Result Date: 09/10/2020 CLINICAL DATA:  Encounter for feeding tube placement. EXAM: ABDOMEN - 1 VIEW COMPARISON:  None. FINDINGS: Distal tip of nasogastric tube is seen in expected position of proximal stomach. IMPRESSION: Distal tip of nasogastric tube seen in expected position of proximal stomach. Electronically Signed   By: Marijo Conception M.D.   On: 09/10/2020 12:55   CT ABDOMEN PELVIS W CONTRAST  Result Date: 09/09/2020 CLINICAL DATA:  Abdominal bloating and vomiting. Recent sigmoid resection for necrotic bowel perforation. EXAM: CT ABDOMEN AND PELVIS WITH CONTRAST TECHNIQUE: Multidetector CT imaging of the abdomen and pelvis was performed using the standard protocol following bolus administration of intravenous contrast. CONTRAST:  126mL OMNIPAQUE IOHEXOL 300 MG/ML  SOLN COMPARISON:  09/01/2020 FINDINGS: Lower Chest: Stable mild right basilar atelectasis. Hepatobiliary: No hepatic masses identified. Focal fatty infiltration again seen in the central left lobe adjacent to the porta hepatis. Gallbladder is unremarkable. No evidence of biliary ductal dilatation. Pancreas:  No mass or inflammatory changes. Spleen: Within normal limits in size and appearance. Adrenals/Urinary Tract: No masses identified. Small left renal cyst noted. No evidence of ureteral calculi or hydronephrosis. Stomach/Bowel: Patient has undergone sigmoid colon resection with left lower quadrant colostomy since previous study. Moderate to severe dilatation of stomach and proximal small bowel is seen containing air-fluid levels. Transition point is seen in the right lateral abdomen adjacent to the ascending colon, and this is suspicious for adhesion. There is no evidence of focal inflammatory process, abscess, or free intraperitoneal air. Tiny amount of free fluid seen in the left paracolic gutter. Normal appendix visualized. Right-sided colonic diverticulosis is noted, however there is no evidence of  diverticulitis. Vascular/Lymphatic: No pathologically enlarged lymph nodes. No acute vascular findings. Aortic atherosclerotic calcification noted. Reproductive: Prior hysterectomy noted. Adnexal regions are unremarkable in appearance. Other:  None. Musculoskeletal:  No suspicious bone lesions identified. IMPRESSION: Small bowel obstruction, with transition point in right lateral abdomen suspicious for adhesion. 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 Right-sided colonic diverticulosis, without radiographic evidence of diverticulitis. Mild right basilar atelectasis. Aortic Atherosclerosis (ICD10-I70.0). Electronically Signed   By: Marlaine Hind M.D.   On: 09/09/2020 16:29        Scheduled Meds: . Chlorhexidine Gluconate Cloth  6 each Topical Daily  . feeding supplement  1 Container Oral TID BM  . gabapentin  100 mg Oral TID  . insulin aspart  0-20 Units Subcutaneous Q4H  . insulin aspart  3 Units Subcutaneous Q4H  . lidocaine  1 patch Transdermal Q24H  . mouth rinse  15 mL Mouth Rinse BID  . melatonin  9 mg Oral QHS  . methylPREDNISolone (SOLU-MEDROL) injection  40 mg Intravenous Q12H  . metoprolol tartrate  25 mg Oral BID  . pantoprazole (PROTONIX) IV  40 mg Intravenous Q24H   Continuous Infusions: . sodium chloride 45 mL/hr at 09/10/20 0329  . potassium PHOSPHATE IVPB (in mmol) 20 mmol (09/10/20 1316)  . TPN ADULT (ION) 65 mL/hr at 09/09/20 1734  . TPN ADULT (ION)       LOS: 12 days    Time spent: 30 minutes    Barb Merino, MD [facility] Pager [phone]

## 2020-09-10 NOTE — Progress Notes (Signed)
PHARMACY - TOTAL PARENTERAL NUTRITION CONSULT NOTE  Indication: Prolonged ileus  Patient Measurements: Height: _0  (160 cm) Weight: 90.3 kg (199 lb 1.2 oz) IBW/kg (Calculated) : 52.4 TPN AdjBW (KG): 59.2 Body mass index is 35.26 kg/m.  Assessment:  [name] who presented with necrotic section of proximal sigmoid colon with perforation and feculent peritonitis now s/p ex lap with partial sigmoid colectomy and colostomy and Hartman's procedure on 09/01/20.  Developed ileus post op and has remained NPO. Pharmacy consulted to manage TPN.   Glucose / Insulin: A1c 11.8%. CBGs improving but remain elevated in the 200s.   Novolog 3 units q4h, Novolog 6 units x2, 70 units rSSI, 80 units insulin R in TPN.  On PTA prednisone 43m BID for neurosarcoidosis (1 dose charted given yesterday) Electrolytes: K 3.5 (goal >/= 4), Phos down to 2.1, high CO2, others WNL Renal: SCr <1, BUN WNL Hepatic: AST / ALT / tbili / TG WNL.  Alk phos down to 175.  Prealbumin up to WNL.  Albumin 2.1 Intake / Output; MIVF: UOP 0.9 ml/kg/hr, 1/2NS at 45 ml/hr, drain 2015mGI Imaging: 4/24 CT: SBO, suspicious for adhesions, diverticulosis GI Surgeries / Procedures: none since TPN start  Central access: PICC placed 09/06/20 TPN start date: 09/06/20   Nutritional Goals: per RD rec on 4/22 1900-2100 Kcal, 110-125 g protein  Current Nutrition:  TPN Boost Breeze - 1 charted given  Plan:  Recalculate TPN to maximize lipid and reduce CHO to improve glucose control  TPN at 75 ml/hr to provide 100g AA, 216g CHO and 1630 kCal, meeting ~80% of needs (goal rate 90 ml/hr)  Electrolytes in TPN: Na 7543mL, incr K 13m78m, Ca 5mEq5m Mg 7mEq/74mincr Phos 18mmol36mCl:Ac 2:1 Add standard MVI and trace elements to TPN Continue resistant SSI Q4H, increase regular insulin in TPN to 100 units, Novolog 3 units Q4H 1/2NS at 45 ml/hr per MD KPhos 20 mmol KCL x 2 runs  F/U AM labs, CBGs to advance TPN to goal  Rhonda [name] D. Rhonda [name], PMina MarbleD,  BCPS, BCCCP [id number], 8:36 AM

## 2020-09-11 ENCOUNTER — Inpatient Hospital Stay (HOSPITAL_COMMUNITY): Payer: 59

## 2020-09-11 DIAGNOSIS — K625 Hemorrhage of anus and rectum: Secondary | ICD-10-CM | POA: Diagnosis not present

## 2020-09-11 DIAGNOSIS — E861 Hypovolemia: Secondary | ICD-10-CM | POA: Diagnosis not present

## 2020-09-11 DIAGNOSIS — I9589 Other hypotension: Secondary | ICD-10-CM | POA: Diagnosis not present

## 2020-09-11 LAB — BASIC METABOLIC PANEL
Anion gap: 5 (ref 5–15)
BUN: 13 mg/dL (ref 6–20)
CO2: 30 mmol/L (ref 22–32)
Calcium: 8.1 mg/dL — ABNORMAL LOW (ref 8.9–10.3)
Chloride: 103 mmol/L (ref 98–111)
Creatinine, Ser: 0.39 mg/dL — ABNORMAL LOW (ref 0.44–1.00)
GFR, Estimated: >60 mL/min (ref >60)
Glucose, Bld: 227 mg/dL — ABNORMAL HIGH (ref 70–99)
Potassium: 4.5 mmol/L (ref 3.5–5.1)
Sodium: 138 mmol/L (ref 135–145)

## 2020-09-11 LAB — CBC WITH DIFFERENTIAL/PLATELET
Abs Immature Granulocytes: 0.6 10*3/uL — ABNORMAL HIGH (ref 0.00–0.07)
Basophils Absolute: 0 10*3/uL (ref 0.0–0.1)
Basophils Relative: 0 %
Eosinophils Absolute: 0 10*3/uL (ref 0.0–0.5)
Eosinophils Relative: 0 %
HCT: 28.8 % — ABNORMAL LOW (ref 36.0–46.0)
Hemoglobin: 8.7 g/dL — ABNORMAL LOW (ref 12.0–15.0)
Immature Granulocytes: 6 %
Lymphocytes Relative: 4 %
Lymphs Abs: 0.4 10*3/uL — ABNORMAL LOW (ref 0.7–4.0)
MCH: 29.7 pg (ref 26.0–34.0)
MCHC: 30.2 g/dL (ref 30.0–36.0)
MCV: 98.3 fL (ref 80.0–100.0)
Monocytes Absolute: 0.1 10*3/uL (ref 0.1–1.0)
Monocytes Relative: 1 %
Neutro Abs: 9.5 10*3/uL — ABNORMAL HIGH (ref 1.7–7.7)
Neutrophils Relative %: 89 %
Platelets: 63 10*3/uL — ABNORMAL LOW (ref 150–400)
RBC: 2.93 MIL/uL — ABNORMAL LOW (ref 3.87–5.11)
RDW: 17.7 % — ABNORMAL HIGH (ref 11.5–15.5)
WBC: 10.6 10*3/uL — ABNORMAL HIGH (ref 4.0–10.5)
nRBC: 1.6 % — ABNORMAL HIGH (ref 0.0–0.2)

## 2020-09-11 LAB — MAGNESIUM: Magnesium: 2.1 mg/dL (ref 1.7–2.4)

## 2020-09-11 LAB — GLUCOSE, CAPILLARY
Glucose-Capillary: 260 mg/dL — ABNORMAL HIGH (ref 70–99)
Glucose-Capillary: 215 mg/dL — ABNORMAL HIGH (ref 70–99)
Glucose-Capillary: 159 mg/dL — ABNORMAL HIGH (ref 70–99)
Glucose-Capillary: 133 mg/dL — ABNORMAL HIGH (ref 70–99)
Glucose-Capillary: 150 mg/dL — ABNORMAL HIGH (ref 70–99)
Glucose-Capillary: 184 mg/dL — ABNORMAL HIGH (ref 70–99)

## 2020-09-11 LAB — PHOSPHORUS: Phosphorus: 3.1 mg/dL (ref 2.5–4.6)

## 2020-09-11 MED ORDER — BISACODYL 10 MG RE SUPP
10.0000 mg | Freq: Every day | RECTAL | Status: DC
  Administered 2020-09-11 – 2020-09-12 (×2): 10 mg via RECTAL
  Filled 2020-09-11 (×3): qty 1

## 2020-09-11 MED ORDER — INSULIN ASPART 100 UNIT/ML ~~LOC~~ SOLN
2.0000 [IU] | SUBCUTANEOUS | Status: DC
  Administered 2020-09-11 – 2020-09-13 (×11): 2 [IU] via SUBCUTANEOUS

## 2020-09-11 MED ORDER — TRAVASOL 10 % IV SOLN
INTRAVENOUS | Status: AC
  Filled 2020-09-11: qty 999

## 2020-09-11 MED ORDER — DOCUSATE SODIUM 50 MG/5ML PO LIQD
100.0000 mg | Freq: Two times a day (BID) | ORAL | Status: DC
  Administered 2020-09-11 – 2020-09-15 (×10): 100 mg
  Filled 2020-09-11 (×10): qty 10

## 2020-09-11 MED ORDER — MELATONIN 3 MG PO TABS
9.0000 mg | ORAL_TABLET | Freq: Every day | ORAL | Status: DC
  Administered 2020-09-11 – 2020-09-15 (×5): 9 mg
  Filled 2020-09-11 (×5): qty 3

## 2020-09-11 NOTE — Progress Notes (Signed)
Progress Note  10 Days Post-Op  Subjective: Patient reports she was a little fuzzy this AM, has mittens on from yesterday. She reports her abdomen is feeling much less bloated and less painful. Her husband is at the bedside this AM.   Objective: Vital signs in last 24 hours: Temp:  [98 F (36.7 C)-98.7 F (37.1 C)] 98 F (36.7 C) (04/26 0814) Pulse Rate:  [103-129] 111 (04/26 0814) Resp:  [16-19] 19 (04/26 0814) BP: (93-140)/(51-96) 137/96 (04/26 0814) SpO2:  [96 %-99 %] 98 % (04/26 0814) FiO2 (%):  [98 %] 98 % (04/26 0105) Weight:  [85.7 kg] 85.7 kg (04/26 0500) Last BM Date: 09/01/20  Intake/Output from previous day: 04/25 0701 - 04/26 0700 In: 3235.4 [I.V.:2869.5; IV Piggyback:365.8] Out: 2190 [Urine:1100; Emesis/NG output:650; Drains:440] Intake/Output this shift: Total I/O In: -  Out: 100 [Emesis/NG output:100]  PE: General: pleasant, WD, obese female who is laying in bed in NAD Heart: sinus tachy in the 120s   Lungs: CTAB, no wheezes, rhonchi, or rales noted.  Respiratory effort nonlabored Abd: soft, appropriately ttp, less distended, BS hypoactive, VAC to midline wound, stoma with small amount hard dark stool, NGT with bilious drainage   Lab Results:  Recent Labs    09/10/20 0355 09/11/20 0240  WBC 11.4* 10.6*  HGB 9.1* 8.7*  HCT 30.1* 28.8*  PLT 73* 63*   BMET Recent Labs    09/10/20 0355 09/11/20 0240  NA 139 138  K 3.5 4.5  CL 102 103  CO2 33* 30  GLUCOSE 223* 227*  BUN 13 13  CREATININE 0.36* 0.39*  CALCIUM 8.3* 8.1*   PT/INR No results for input(s): LABPROT, INR in the last 72 hours. CMP     Component Value Date/Time   NA 138 09/11/2020 0240   NA 129 (L) 08/06/2020 1003   K 4.5 09/11/2020 0240   CL 103 09/11/2020 0240   CO2 30 09/11/2020 0240   GLUCOSE 227 (H) 09/11/2020 0240   BUN 13 09/11/2020 0240   BUN 6 08/06/2020 1003   CREATININE 0.39 (L) 09/11/2020 0240   CREATININE 0.85 08/02/2015 1710   CALCIUM 8.1 (L) 09/11/2020  0240   PROT 4.3 (L) 09/10/2020 0355   PROT 4.9 (L) 08/06/2020 1003   ALBUMIN 2.1 (L) 09/10/2020 0355   ALBUMIN 3.2 (L) 08/06/2020 1003   AST 20 09/10/2020 0355   ALT 23 09/10/2020 0355   ALKPHOS 175 (H) 09/10/2020 0355   BILITOT 0.5 09/10/2020 0355   BILITOT 0.4 08/06/2020 1003   GFRNONAA >60 09/11/2020 0240   GFRNONAA 78 08/02/2015 1710   GFRAA >60 01/17/2020 0546   GFRAA >89 08/02/2015 1710   Lipase     Component Value Date/Time   LIPASE 64 (H) 07/23/2020 0432       Studies/Results: DG Abd 1 View  Result Date: 09/10/2020 CLINICAL DATA:  NG tube placement EXAM: ABDOMEN - 1 VIEW COMPARISON:  Radiograph 09/10/2020 at 1220 hours FINDINGS: NG 2 within the stomach. Side port just below the GE junction. Dilated loops of small bowel up to 4 cm again noted. IMPRESSION: NG tube in stomach.  Minimal change. Electronically Signed   By: Suzy Bouchard M.D.   On: 09/10/2020 18:32   DG Abd 1 View  Result Date: 09/10/2020 CLINICAL DATA:  Encounter for feeding tube placement. EXAM: ABDOMEN - 1 VIEW COMPARISON:  None. FINDINGS: Distal tip of nasogastric tube is seen in expected position of proximal stomach. IMPRESSION: Distal tip of nasogastric tube seen in expected  position of proximal stomach. Electronically Signed   By: Marijo Conception M.D.   On: 09/10/2020 12:55   CT ABDOMEN PELVIS W CONTRAST  Result Date: 09/09/2020 CLINICAL DATA:  Abdominal bloating and vomiting. Recent sigmoid resection for necrotic bowel perforation. EXAM: CT ABDOMEN AND PELVIS WITH CONTRAST TECHNIQUE: Multidetector CT imaging of the abdomen and pelvis was performed using the standard protocol following bolus administration of intravenous contrast. CONTRAST:  11mL OMNIPAQUE IOHEXOL 300 MG/ML  SOLN COMPARISON:  09/01/2020 FINDINGS: Lower Chest: Stable mild right basilar atelectasis. Hepatobiliary: No hepatic masses identified. Focal fatty infiltration again seen in the central left lobe adjacent to the porta hepatis.  Gallbladder is unremarkable. No evidence of biliary ductal dilatation. Pancreas:  No mass or inflammatory changes. Spleen: Within normal limits in size and appearance. Adrenals/Urinary Tract: No masses identified. Small left renal cyst noted. No evidence of ureteral calculi or hydronephrosis. Stomach/Bowel: Patient has undergone sigmoid colon resection with left lower quadrant colostomy since previous study. Moderate to severe dilatation of stomach and proximal small bowel is seen containing air-fluid levels. Transition point is seen in the right lateral abdomen adjacent to the ascending colon, and this is suspicious for adhesion. There is no evidence of focal inflammatory process, abscess, or free intraperitoneal air. Tiny amount of free fluid seen in the left paracolic gutter. Normal appendix visualized. Right-sided colonic diverticulosis is noted, however there is no evidence of diverticulitis. Vascular/Lymphatic: No pathologically enlarged lymph nodes. No acute vascular findings. Aortic atherosclerotic calcification noted. Reproductive: Prior hysterectomy noted. Adnexal regions are unremarkable in appearance. Other:  None. Musculoskeletal:  No suspicious bone lesions identified. IMPRESSION: Small bowel obstruction, with transition point in right lateral abdomen suspicious for adhesion. 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 Right-sided colonic diverticulosis, without radiographic evidence of diverticulitis. Mild right basilar atelectasis. Aortic Atherosclerosis (ICD10-I70.0). Electronically Signed   By: Marlaine Hind M.D.   On: 09/09/2020 16:29    Anti-infectives: Anti-infectives (From admission, onward)   Start     Dose/Rate Route Frequency Ordered Stop   09/01/20 1930  ciprofloxacin (CIPRO) IVPB 400 mg        400 mg 200 mL/hr over 60 Minutes Intravenous Every 12 hours 09/01/20 1836 09/05/20 1927   09/01/20 1930  metroNIDAZOLE (FLAGYL) IVPB 500 mg        500 mg 100 mL/hr  over 60 Minutes Intravenous Every 8 hours 09/01/20 1836 09/05/20 2124       Assessment/Plan Hx of HTN DM2 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  OSA ABL anemia- hgb 8.7  - above per [facility]-   Ms. Ingham is a 60 year old female with a history of GI bleed s/p angioembolization of a branch of the IMA on 4/13, who developed a necrotic section of proximal sigmoid colon with perforation and feculent peritonitis - S/pExploratory Laparotomy, Partial Colectomy (sigmoid), colostomy creation,Hartman's procedure on 4/16/2022by Dr. Grandville Silos - POD #10 -Ileus post-op.CT 4/24 shows possible SBO. Patient with small amt very hard stool in ostomy appliance - continue NGT today, add colace and stomal suppositories - separation of stoma inferiorly and tight at fascial level - continue to monitor closely  -Completed4 days post opabxdue to feculent peritonitis. Afebrile and wbc wnl (on chronic steroids). -Continue PT/OT, mobilize - recommending SNF - WOC following for ostomy education -Vac changes MWF - Pulm toilet  FEN -TPN, NPO, NGT to LIWS VTE -SCDs,okay for chemical prophylaxis from a general surgery standpoint ID -Cipro/Flagyl4/16- 4/20. None currently Foley - None  LOS: 13  days    Norm Parcel, [facility] Surgery 09/11/2020, 9:48 AM Please see Amion for pager number during day hours 7:00am-4:30pm

## 2020-09-11 NOTE — Progress Notes (Signed)
Occupational Therapy Treatment Patient Details Name: Rhonda [name] MRN: [id number] DOB: 07/27/1960 Today's Date: 09/11/2020    History of present illness 60 yo female presented to [facility] on 08/29/20 with rectal bleeding (several recent admissions for similar). Pt admitted with hemorrhagic shock.  She is s/p IR for coil embolization of intraluminal extravasation for a distal arcade branch of the IMA on 08/29/20.  She developed abdominal pain and found to have SBO with bowel perforation s/p emergent exp lap, partial colectomy, colostomy, Hartman's and wound vac application 3/76/28.  Medical hx inlcudes DM2, seizures, HTN, DDD, recurrent GIB.  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   OT comments  Pt making gradual progress towards OT goals this session. Pt limited by dizziness this session, BP WFL ( see vitals below). Pt currently requires MOD- MAX A +2 to transition to EOB, pt able to sit EOB briefly with MIN- MOD A +2 but reports fatigue and dizziness needing MAX A +2 to return to supine. Remainder of session focus on bed level therex that can be completed to increase overall activity tolerance and strength. Pt would continue to benefit from skilled occupational therapy while admitted and after d/c to address the below listed limitations in order to improve overall functional mobility and facilitate independence with BADL participation. DC plan remains appropriate, will follow acutely per POC.   Supine- 126/76 ( 88) Unable to get BP in sitting Supine after sitting EOB ~ 2 mins 143/88 ( 104)  * HR max 123 bpm with activity     Follow Up Recommendations  SNF;Supervision/Assistance - 24 hour    Equipment Recommendations  Hospital bed;Other (comment) (to be continually assessed and updated)    Recommendations for Other Services      Precautions / Restrictions Precautions Precautions: Fall Precaution Comments: Abdomen incision with wound vac, NG  tube Restrictions Weight Bearing Restrictions: No       Mobility Bed Mobility   Bed Mobility: Supine to Sit;Sit to Supine     Supine to sit: Mod assist;+2 for physical assistance;HOB elevated Sit to supine: Max assist;+2 for physical assistance   General bed mobility comments: pt requried assist to advance BLEs to EOB reporting that LLE was more difficulty to advance. assisted needed to elevate trunk into sitting, pt needed MAX A +2 to return to supine d/t reports of dizziness and fatigue    Transfers                 General transfer comment: NT d/t dizziness    Balance Overall balance assessment: Needs assistance Sitting-balance support: Bilateral upper extremity supported;Feet supported Sitting balance-[name]: Fair Sitting balance - Comments: MIN - MOD A for static sitting balance briefly       Standing balance comment: NT                           ADL either performed or assessed with clinical judgement   ADL Overall ADL's : Needs assistance/impaired                                       General ADL Comments: unable to progress OOB this session, pt agreeable to attempt sitting EOB but reports dizziness needing to return to supine, remainder of session focus on bed level therex to increase activity tolerance     Vision  Perception     Praxis      Cognition Arousal/Alertness: Awake/alert Behavior During Therapy: WFL for tasks assessed/performed;Flat affect;Anxious (anxious once sitting EOB) Overall Cognitive Status: History of cognitive impairments - at baseline                                 General Comments: pt initially very motivated to work with therapy but once EOB became very anxious and no longer following commands, perseverating on pain        Exercises General Exercises - Lower Extremity Ankle Circles/Pumps: AROM;Both;10 reps;Supine Long Arc Quad: Both;10  reps;Strengthening;Supine Straight Leg Raises: Both;10 reps;Supine;Other (comment);AAROM (pt required physical assist to fully elevate BLEs off bed) Other Exercises Other Exercises: BUE positioned on bilateral bed rails elevating trunk forward and off bed x10 reps ( pt unable to full lift trunk off back of bed without assist) Other Exercises: elbow flexion/ extension x10 reps from bed level Other Exercises: shoulder pumps x10 reps from bed level   Shoulder Instructions       General Comments husband present during session, supine BP 126/76 ( 88), supine BP after sitting EOB 143/88 ( 104) HR max 123 bpm with activity    Pertinent Vitals/ Pain       Pain Assessment: Faces Faces Pain Scale: Hurts little more Pain Location: ABD incisison Pain Descriptors / Indicators: Grimacing;Guarding Pain Intervention(s): Limited activity within patient's tolerance;Monitored during session;Repositioned  Home Living                                          Prior Functioning/Environment              Frequency  Min 2X/week        Progress Toward Goals  OT Goals(current goals can now be found in the care plan section)  Progress towards OT goals: Progressing toward goals  Acute Rehab OT Goals Patient Stated Goal: to get her tube out OT Goal Formulation: With patient/family Time For Goal Achievement: 09/18/20 Potential to Achieve Goals: Good  Plan Discharge plan remains appropriate;Frequency remains appropriate    Co-evaluation                 AM-PAC OT "6 Clicks" Daily Activity     Outcome Measure   Help from another person eating meals?: A Little Help from another person taking care of personal grooming?: A Little Help from another person toileting, which includes using toliet, bedpan, or urinal?: Total Help from another person bathing (including washing, rinsing, drying)?: Total Help from another person to put on and taking off regular upper body  clothing?: A Lot Help from another person to put on and taking off regular lower body clothing?: Total 6 Click Score: 11    End of Session    OT Visit Diagnosis: Unsteadiness on feet (R26.81);Other abnormalities of gait and mobility (R26.89);Muscle weakness (generalized) (M62.81);Other symptoms and signs involving cognitive function;Pain Pain - part of body:  (ABD)   Activity Tolerance Other (comment) (limited by dizziness)   Patient Left in bed;with call bell/phone within reach;with bed alarm set;with family/visitor present   Nurse Communication Mobility status        Time: 1339-1401 OT Time Calculation (min): 22 min  Charges: OT General Charges $OT Visit: 1 Visit OT Treatments $Therapeutic Activity: 8-22 mins  Corinne Ports K., COTA/L  Acute Rehabilitation Services [phone]   Precious Haws 09/11/2020, 3:01 PM

## 2020-09-11 NOTE — Progress Notes (Signed)
Physical Therapy Treatment Patient Details Name: Rhonda [name] MRN: [id number] DOB: 09-Nov-1960 Today's Date: 09/11/2020    History of Present Illness 60 yo female presented to [facility] on 08/29/20 with rectal bleeding (several recent admissions for similar). Pt admitted with hemorrhagic shock.  She is s/p IR for coil embolization of intraluminal extravasation for a distal arcade branch of the IMA on 08/29/20.  She developed abdominal pain and found to have SBO with bowel perforation s/p emergent exp lap, partial colectomy, colostomy, Hartman's and wound vac application 9/52/84.  Medical hx inlcudes DM2, seizures, HTN, DDD, recurrent GIB.  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    PT Comments    Pt agreeable to working with therapy on entry, with goal of coming to standing. Pt supine BP 113/77. Pt requires modAx2 for coming EoB, with c/o of dizziness in sitting. Attempted to get sitting BP, however pt with increased pain in abdomen in flexed position. Pt request to return to supine requiring maxAx2 to return to bed. D/c plan remains appropriate at this time. PT will continue to follow acutely.     Follow Up Recommendations  SNF     Equipment Recommendations  Other (comment)       Precautions / Restrictions Precautions Precautions: Fall Precaution Comments: Abdomen incision with wound vac, NG tube Restrictions Weight Bearing Restrictions: No    Mobility  Bed Mobility Overal bed mobility: Needs Assistance Bed Mobility: Supine to Sit;Sit to Supine Rolling: Mod assist Sidelying to sit: Mod assist;+2 for safety/equipment   Sit to supine: Max assist;+2 for physical assistance   General bed mobility comments: pt with good command follow to come to sidelying, pt able to manage LE to EoB, assist needed to drop off EoB, and modA for bringing trunk to upright, maxAx2 required to return to bed due to increased incisional pain    Transfers                  General transfer comment: unable to attempt, dizziness with sitting, also increased pain in abdomen, unable to progress to standing due to dizziness         Balance Overall balance assessment: Needs assistance Sitting-balance support: Bilateral upper extremity supported Sitting balance-[name]: Fair                                      Cognition Arousal/Alertness: Awake/alert;Lethargic Behavior During Therapy: WFL for tasks assessed/performed;Flat affect Overall Cognitive Status: History of cognitive impairments - at baseline                                 General Comments: pt agreeable to therapy and motivated to work, good command follow         General Comments General comments (skin integrity, edema, etc.): Husband present throughout session, BP in supine 113/77, HR 121 bpm, with sitting HR 136bpm, unable to collect BP due to abdominal pain      Pertinent Vitals/Pain Pain Assessment: No/denies pain Faces Pain Scale: Hurts even more Pain Location: abdomen with sitting, no pain in supine only sitting Pain Descriptors / Indicators: Grimacing;Guarding Pain Intervention(s): Limited activity within patient's tolerance;Monitored during session;Repositioned           PT Goals (current goals can now be found in the care plan section) Acute Rehab PT Goals  Patient Stated Goal: pt would like to be able to move around better, be able to cook again. husband would like for pt to go to rehab to progress functional abilities PT Goal Formulation: With patient/family Time For Goal Achievement: 09/17/20 Potential to Achieve Goals: Good Progress towards PT goals: Not progressing toward goals - comment (limited by dizziness with positional change and abdominal pain in sitting)    Frequency    Min 2X/week      PT Plan Current plan remains appropriate       AM-PAC PT "6 Clicks" Mobility   Outcome Measure  Help needed turning  from your back to your side while in a flat bed without using bedrails?: A Lot Help needed moving from lying on your back to sitting on the side of a flat bed without using bedrails?: A Lot Help needed moving to and from a bed to a chair (including a wheelchair)?: Total Help needed standing up from a chair using your arms (e.g., wheelchair or bedside chair)?: Total Help needed to walk in hospital room?: Total Help needed climbing 3-5 steps with a railing? : Total 6 Click Score: 8    End of Session Equipment Utilized During Treatment: Gait belt Activity Tolerance: Patient tolerated treatment well Patient left: in bed;with call bell/phone within reach;with family/visitor present Nurse Communication: Mobility status;Need for lift equipment PT Visit Diagnosis: Other abnormalities of gait and mobility (R26.89);Muscle weakness (generalized) (M62.81);Pain     Time: 8768-1157 PT Time Calculation (min) (ACUTE ONLY): 20 min  Charges:  $Therapeutic Activity: 8-22 mins                     09/11/2020  Willian Donson B. Migdalia Dk PT, DPT Acute Rehabilitation Services Pager [phone] Office [phone]    [place] 09/11/2020, 10:02 AM

## 2020-09-11 NOTE — Progress Notes (Signed)
PHARMACY - TOTAL PARENTERAL NUTRITION CONSULT NOTE  Indication: Prolonged ileus  Patient Measurements: Height: _0  (160 cm) Weight: 85.7 kg (188 lb 15 oz) (wound vac is attached to the bed) IBW/kg (Calculated) : 52.4 TPN AdjBW (KG): 59.2 Body mass index is 33.47 kg/m.  Assessment:  [name] who presented with necrotic section of proximal sigmoid colon with perforation and feculent peritonitis now s/p ex lap with partial sigmoid colectomy and colostomy and Hartman's procedure on 09/01/20.  Developed ileus post op and has remained NPO. Pharmacy consulted to manage TPN.  NG tube placed 4/25.   Glucose / Insulin: A1c 11.8%. CBGs improving but remain elevated in the 200s.   Novolog 3 units q4h, 68 units rSSI, 100 units insulin R in TPN.  On PTA prednisone 34m BID for neurosarcoidosis >> methylpred [id number]  Electrolytes: K 4.5 (goal >/= 4), Phos 3.1, CO2 30, CoCa 9.6, others WNL Renal: SCr <1, BUN WNL Hepatic: AST / ALT / tbili / TG WNL.  Alk phos down to 175.  Prealbumin WNL.   Albumin 2.1 Intake / Output; MIVF: UOP 0.5 ml/kg/hr, NS at 45 ml/hr, drain [id number] Imaging: 4/24 CT: SBO, suspicious for adhesions, diverticulosis GI Surgeries / Procedures: none since TPN start  Central access: PICC placed 09/06/20 TPN start date: 09/06/20   Nutritional Goals: per RD rec on 4/22 1900-2100 Kcal, 110-125 g protein Fluid   Current Nutrition:  TPN Boost Breeze - 0 charted given 4/25  Plan:  TPN at 75 ml/hr to provide 100g AA, 216g CHO and 1630 kCal, meeting ~80% of needs (goal rate 90 ml/hr)  Electrolytes in TPN: Na 27m14m, K 60mE45m Ca 5mEq/47mMg 7mEq/L6mhos 18mmol/7ml:Ac 2:1 Add standard MVI and trace elements to TPN Continue resistant SSI Q4H, continue regular insulin in TPN to 100 units, Novolog 2 units Q4H   MIVF to KVO  F/U AM labs, CBGs to advance TPN to goal   MadelineWilson Singer PGY1 Pharmacy Resident 09/11/2020 7:39 AM

## 2020-09-11 NOTE — Progress Notes (Signed)
PROGRESS NOTE    Rhonda [name]  XVQ:[id number] DOB: [birth year] DOA: 08/29/2020 PCP: [place].    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   Significant events: 3/8, admission with rectal bleeding, EGD and colonoscopy negative.   4/4-4/6 admitted with hematochezia, symptomatically treated, capsule endoscopy and discharged home. 4/13 presented with hematochezia, CT angiogram with acute bleeding in the descending colon status post coil embolization of the distal Arcade branch of the superior sigmoidal artery. Insulin infusion for high blood sugars.  Admitted to ICU. 4/15, transferred out of ICU.  4/16, afternoon after eating lunch, patient had severe abdominal pain.  Relieved with pain medication.  A CT scan showed visceral perforation.  Underwent urgent exploratory laparotomy and found to have necrotic sigmoid colon.  Status post Jeanette Caprice procedure with colostomy and wound VAC for midline incision. 4/22, no return of bowel function yet.  on TPN.  Continues to have ileus, NG tube decompression 4/25. 4/26, some stool noted in the colostomy.   Assessment & Plan:   Active Problems:   GI bleed   Hypotension   Rectal bleeding   AKI (acute kidney injury) ([place])  Perforated sigmoid colon with history of diverticulosis and multiple procedures.  IMA embolization 4/13. Emergency exploratory laparotomy and Hartman's procedure 4/16. Prolonged ileus.  No return of bowel function yet. Continues ileus, NG tube for decompression 4/25. Followed by surgery.  Increasing mobility as much. Peritoneal cultures negative.  Completed antibiotic therapy. PICC line and TPN.  Fluid management,  electrolytes replaced by pharmacy as per TPN protocol.  Acute lower GI bleeding, diverticular bleeding.  Hemorrhagic shock secondary to acute GI bleeding and anemia of blood loss: Hemoglobin is stable since surgery.  Acute renal failure: Due to #1.  Improved and normalized.  On maintenance IV fluid along with TPN.  Type 2 diabetes with hyperglycemia: On TPN.  On increasing dose of insulin.  Neurosarcoidosis/chronic debility and wheelchair-bound status:  Patient on prednisone 40 mg twice a day at home.  Given Solu-Medrol perioperative.  Unreliable oral intake, will change to IV Solu-Medrol 40 mg twice a day until she has reliable oral intake to avoid corticosteroid insufficiency. Occasional delirium, symptomatic treatment with Ativan.  Melatonin at night.  We will clamp NG tube and give melatonin.  Obstructive sleep apnea: Declined CPAP.  Not using.  Hypertension: On metoprolol.  Heart rate acceptable.  If unable to use by mouth, will use as needed IV metoprolol.  Hypochloremic hyponatremia: Normalizing.   Hypomagnesemia/hypokalemia: Replaced by pharmacy per TPN protocol.  DVT prophylaxis: SCDs Start: 08/29/20 2256   Code Status: Full code Family Communication: Husband at the bedside. Disposition Plan: Status is: Inpatient  Remains inpatient appropriate because:Inpatient level of care appropriate due to severity of illness   Dispo: The patient is from: Home              Anticipated d/c is to: Skilled nursing facility.              Patient currently is not medically stable to d/c.   Difficult to place patient no         Consultants:   PCCM  Gastroenterology  Interventional radiology  Surgery.  Procedures:   Embolization inferior mesenteric artery 4/13  Hartman's procedure 4/16.  Antimicrobials:  Antibiotics Given (  last 72 hours)    None       Subjective: Patient seen and examined.  She kept her eyes closed and nodded and used her hand gestures.  Awake  on stimulation.  Husband at the bedside.  Denied any abdominal pain.  Objective: Vitals:   09/11/20 0400 09/11/20 0500 09/11/20 0814 09/11/20 1149  BP: 129/86  (!) 137/96 129/83  Pulse: (!) 103  (!) 111   Resp: 19  19 18   Temp: 98.5 F (36.9 C)  98 F (36.7 C) 98.1 F (36.7 C)  TempSrc: Axillary  Axillary Axillary  SpO2: 99%  98%   Weight:  85.7 kg    Height:        Intake/Output Summary (Last 24 hours) at 09/11/2020 1409 Last data filed at 09/11/2020 0841 Gross per 24 hour  Intake 3235.35 ml  Output 2290 ml  Net 945.35 ml   Filed Weights   09/06/20 1201 09/08/20 0500 09/11/20 0500  Weight: 92.9 kg 90.3 kg 85.7 kg    Examination: General: Frail and debilitated.  Sleepy. Cardiovascular: S1-S2 normal.  No edema.  No added sounds. Respiratory: No added sounds. Gastrointestinal: Soft, distended, mild tenderness but no obvious tympanic distention.  No localized residual guarding.  Bowel sounds sluggish.  Colostomy with small amount of darkish stool.  Midline surgical incision clean and dry.  Wound VAC in place. Ext: No edema.  No joint swelling. Neuro: Patient has cushingoid face, she does not have any focal deficit, however she has generalized weakness and unable to bear weight on lower extremities due to chronic generalized weakness.   Data Reviewed: I have personally reviewed following labs and imaging studies  CBC: Recent Labs  Lab 09/07/20 0444 09/08/20 0305 09/09/20 0354 09/10/20 0355 09/11/20 0240  WBC 7.8 10.0 11.5* 11.4* 10.6*  NEUTROABS 6.3 8.0* 10.0* 11.2* 9.5*  HGB 9.7* 10.3* 9.0* 9.1* 8.7*  HCT 31.9* 34.3* 29.7* 30.1* 28.8*  MCV 96.4 98.0 98.0 98.0 98.3  PLT 108* 115* 88* 73* 63*   Basic Metabolic Panel: Recent Labs  Lab 09/07/20 0444 09/08/20 0305 09/09/20 0354 09/10/20 0355 09/11/20 0240  NA 141 139 135 139 138  K 3.9 3.3* 3.8 3.5 4.5  CL 103 103 101 102 103  CO2 28 26 28  33* 30  GLUCOSE 274* 338* 291* 223* 227*  BUN 8 9 15 13 13    CREATININE 0.49 0.50 0.47 0.36* 0.39*  CALCIUM 8.6* 8.9 8.5* 8.3* 8.1*  MG 1.9 1.9 2.1 2.0 2.1  PHOS 2.3* 3.2 2.9 2.1* 3.1   GFR: Estimated Creatinine Clearance: 78.5 mL/min (A) (by C-G formula based on SCr of 0.39 mg/dL (L)). Liver Function Tests: Recent Labs  Lab 09/07/20 0444 09/10/20 0355  AST 15 20  ALT 27 23  ALKPHOS 217* 175*  BILITOT 0.8 0.5  PROT 4.7* 4.3*  ALBUMIN 2.2* 2.1*   No results for input(s): LIPASE, AMYLASE in the last 168 hours. No results for input(s): AMMONIA in the last 168 hours. Coagulation Profile: No results for input(s): INR, PROTIME in the last 168 hours. Cardiac Enzymes: No results for input(s): CKTOTAL, CKMB, CKMBINDEX, TROPONINI in the last 168 hours. BNP (last 3 results) No results for input(s): PROBNP in the last 8760 hours. HbA1C: No results for input(s): HGBA1C in the last 72 hours. CBG: Recent Labs  Lab 09/10/20 2054 09/11/20 0102 09/11/20 0342 09/11/20 0817 09/11/20 1141  GLUCAP 270* 260* 215* 159* 133*   Lipid Profile: Recent Labs    09/10/20 0355  TRIG 144  Thyroid Function Tests: No results for input(s): TSH, T4TOTAL, FREET4, T3FREE, THYROIDAB in the last 72 hours. Anemia Panel: No results for input(s): VITAMINB12, FOLATE, FERRITIN, TIBC, IRON, RETICCTPCT in the last 72 hours. Sepsis Labs: No results for input(s): PROCALCITON, LATICACIDVEN in the last 168 hours.  Recent Results (from the past 240 hour(s))  Aerobic/Anaerobic Culture w Gram Stain (surgical/deep wound)     Status: None   Collection Time: 09/01/20 11:22 PM   Specimen: PATH Cytology Peritoneal fluid; Body Fluid  Result Value Ref Range Status   Specimen Description PERITONEAL FLUID  Final   Special Requests IN SWABS PATIENT ON FOLLOWING CIPRO,FLAGYL  Final   Gram Stain   Final    RARE WBC PRESENT, PREDOMINANTLY PMN NO ORGANISMS SEEN    Culture   Final    MIXED ANAEROBIC FLORA PRESENT.  CALL LAB IF FURTHER IID REQUIRED. NO GROWTH  AEROBICALLY Performed at [facility] [address], [place], [place] [address]    Report Status 09/07/2020 FINAL  Final         Radiology Studies: DG Abd 1 View  Result Date: 09/10/2020 CLINICAL DATA:  NG tube placement EXAM: ABDOMEN - 1 VIEW COMPARISON:  Radiograph 09/10/2020 at 1220 hours FINDINGS: NG 2 within the stomach. Side port just below the GE junction. Dilated loops of small bowel up to 4 cm again noted. IMPRESSION: NG tube in stomach.  Minimal change. Electronically Signed   By: Suzy Bouchard M.D.   On: 09/10/2020 18:32   DG Abd 1 View  Result Date: 09/10/2020 CLINICAL DATA:  Encounter for feeding tube placement. EXAM: ABDOMEN - 1 VIEW COMPARISON:  None. FINDINGS: Distal tip of nasogastric tube is seen in expected position of proximal stomach. IMPRESSION: Distal tip of nasogastric tube seen in expected position of proximal stomach. Electronically Signed   By: Marijo Conception M.D.   On: 09/10/2020 12:55   CT ABDOMEN PELVIS W CONTRAST  Result Date: 09/09/2020 CLINICAL DATA:  Abdominal bloating and vomiting. Recent sigmoid resection for necrotic bowel perforation. EXAM: CT ABDOMEN AND PELVIS WITH CONTRAST TECHNIQUE: Multidetector CT imaging of the abdomen and pelvis was performed using the standard protocol following bolus administration of intravenous contrast. CONTRAST:  174mL OMNIPAQUE IOHEXOL 300 MG/ML  SOLN COMPARISON:  09/01/2020 FINDINGS: Lower Chest: Stable mild right basilar atelectasis. Hepatobiliary: No hepatic masses identified. Focal fatty infiltration again seen in the central left lobe adjacent to the porta hepatis. Gallbladder is unremarkable. No evidence of biliary ductal dilatation. Pancreas:  No mass or inflammatory changes. Spleen: Within normal limits in size and appearance. Adrenals/Urinary Tract: No masses identified. Small left renal cyst noted. No evidence of ureteral calculi or hydronephrosis. Stomach/Bowel: Patient has undergone sigmoid colon  resection with left lower quadrant colostomy since previous study. Moderate to severe dilatation of stomach and proximal small bowel is seen containing air-fluid levels. Transition point is seen in the right lateral abdomen adjacent to the ascending colon, and this is suspicious for adhesion. There is no evidence of focal inflammatory process, abscess, or free intraperitoneal air. Tiny amount of free fluid seen in the left paracolic gutter. Normal appendix visualized. Right-sided colonic diverticulosis is noted, however there is no evidence of diverticulitis. Vascular/Lymphatic: No pathologically enlarged lymph nodes. No acute vascular findings. Aortic atherosclerotic calcification noted. Reproductive: Prior hysterectomy noted. Adnexal regions are unremarkable in appearance. Other:  None. Musculoskeletal:  No suspicious bone lesions identified. IMPRESSION: Small bowel obstruction, with transition point in right lateral abdomen suspicious for adhesion. Interval sigmoid  colon resection with left lower quadrant colostomy. Right-sided colonic diverticulosis, without radiographic evidence of diverticulitis. Mild right basilar atelectasis. Aortic Atherosclerosis (ICD10-I70.0). Electronically Signed   By: Marlaine Hind M.D.   On: 09/09/2020 16:29        Scheduled Meds: . bisacodyl  10 mg Rectal Daily  . Chlorhexidine Gluconate Cloth  6 each Topical Daily  . docusate  100 mg Per Tube BID  . feeding supplement  1 Container Oral TID BM  . gabapentin  100 mg Oral TID  . insulin aspart  0-20 Units Subcutaneous Q4H  . insulin aspart  2 Units Subcutaneous Q4H  . lidocaine  1 patch Transdermal Q24H  . mouth rinse  15 mL Mouth Rinse BID  . melatonin  9 mg Oral QHS  . methylPREDNISolone (SOLU-MEDROL) injection  40 mg Intravenous Q12H  . metoprolol tartrate  25 mg Oral BID  . pantoprazole (PROTONIX) IV  40 mg Intravenous Q24H   Continuous Infusions: . sodium chloride 20 mL/hr at 09/11/20 1243  . TPN ADULT (ION)  75 mL/hr at 09/10/20 1735  . TPN ADULT (ION)       LOS: 13 days    Time spent: 30 minutes    Barb Merino, MD [facility] Pager [phone]

## 2020-09-11 NOTE — Progress Notes (Signed)
   09/10/20 2055  Assess: MEWS Score  Temp 98.5 F (36.9 C)  BP 123/81  Pulse Rate (!) 113  ECG Heart Rate (!) 121  Resp 19  Level of Consciousness Alert  SpO2 96 %  O2 Device Room Air  Patient Activity (if Appropriate) In bed  Assess: MEWS Score  MEWS Temp 0  MEWS Systolic 0  MEWS Pulse 2  MEWS RR 0  MEWS LOC 0  MEWS Score 2  MEWS Score Color Yellow  Assess: if the MEWS score is Yellow or Red  Were vital signs taken at a resting state? Yes  Focused Assessment No change from prior assessment  Early Detection of Sepsis Score *See Row Information* Low  MEWS guidelines implemented *See Row Information* No, previously yellow, continue vital signs every 4 hours  Treat  MEWS Interventions Administered prn meds/treatments  Pain Scale 0-10  Pain Score Asleep  Patients response to intervention Relief  Take Vital Signs  Increase Vital Sign Frequency  Yellow: Q 2hr X 2 then Q 4hr X 2, if remains yellow, continue Q 4hrs  Escalate  MEWS: Escalate Yellow: discuss with charge nurse/RN and consider discussing with provider and RRT  Notify: Charge Nurse/RN  Name of Charge Nurse/RN Notified Elmyra Ricks RN  Date Charge Nurse/RN Notified 09/10/20  Time Charge Nurse/RN Notified 2055  Document  Patient Outcome Other (Comment);Stabilized after interventions (prn pain medications)  Progress note created (see row info) Yes

## 2020-09-11 NOTE — Progress Notes (Signed)
Nutrition Follow-up  DOCUMENTATION CODES:   Not applicable  INTERVENTION:    TPN dosing per Pharmacy to meet nutrition needs.  D/C Boost Breeze, patient is NPO.  NUTRITION DIAGNOSIS:   Inadequate oral intake related to altered GI function as evidenced by other (comment) (NPO/Clear liquid diet).  Ongoing  GOAL:   Patient will meet greater than or equal to 90% of their needs   Met with TPN  MONITOR:   PO intake,Supplement acceptance,Diet advancement,Labs,Skin,I & O's  REASON FOR ASSESSMENT:   NPO/Clear Liquid Diet,Consult New TPN/TNA  ASSESSMENT:   59 yo female admitted with frank rectal bleeding and hypotension. 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   4/13 - S/P coil embolization of the distal Arcade branch of the superior sigmoidal artery. 4/16 - Developed severe abdominal pain. CT showed visceral perforation. S/P emergent ex lap, found to have necrotic sigmoid colon. 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   Diet changed to NPO on 4/24.  NG tube was placed: 650 ml output x 24 hours (bilious). VAC in place to abdominal incision: 440 ml output x 24 hours.  No colostomy output documented past 24 hours. Hard stool in ostomy. Colace and stomal suppositories added today.   TPN currently infusing at 75 ml/h to provide 1800 ml, 1629 kcal, 100 gm protein per day. Not quite at goal rate yet. Meeting 90% of minimum protein needs and 86% of minimum calorie needs.   Labs reviewed.  CBG: [phone]  Medications reviewed and include Colace, Novolog SSI, Novolog 2 units q 4 hours, Protonix, prednisone.  Lowest weight since admission 79.5 kg on 4/14. Currently 85.7 kg (increased with swelling/edema). +1 mild pitting edema, generalized, and to BLE.   Diet Order:   Diet Order            Diet NPO time specified Except for: Ice Chips  Diet  effective now                 EDUCATION NEEDS:   Not appropriate for education at this time  Skin:  Skin Assessment: Skin Integrity Issues: Skin Integrity Issues:: Other (Comment),Incisions,Wound VAC Wound Vac: abdominal incision Incisions: R chest; abdomen Other: skin tear R thigh  Last BM:  4/25 type 7 (colostomy)  Height:   Ht Readings from Last 1 Encounters:  08/30/20 5' 3" (1.6 m)    Weight:   Wt Readings from Last 1 Encounters:  09/11/20 85.7 kg    Ideal Body Weight:  52.3 kg  BMI:  Body mass index is 33.47 kg/m.  Estimated Nutritional Needs:   Kcal:  1900-2100  Protein:  110-125 gm  Fluid:  >/= 2 L     H, RD, LDN, CNSC Please refer to Amion for contact information.                                                        

## 2020-09-12 DIAGNOSIS — K625 Hemorrhage of anus and rectum: Secondary | ICD-10-CM | POA: Diagnosis not present

## 2020-09-12 DIAGNOSIS — E861 Hypovolemia: Secondary | ICD-10-CM | POA: Diagnosis not present

## 2020-09-12 DIAGNOSIS — I9589 Other hypotension: Secondary | ICD-10-CM | POA: Diagnosis not present

## 2020-09-12 LAB — GLUCOSE, CAPILLARY
Glucose-Capillary: 168 mg/dL — ABNORMAL HIGH (ref 70–99)
Glucose-Capillary: 212 mg/dL — ABNORMAL HIGH (ref 70–99)
Glucose-Capillary: 219 mg/dL — ABNORMAL HIGH (ref 70–99)
Glucose-Capillary: 242 mg/dL — ABNORMAL HIGH (ref 70–99)
Glucose-Capillary: 248 mg/dL — ABNORMAL HIGH (ref 70–99)
Glucose-Capillary: 286 mg/dL — ABNORMAL HIGH (ref 70–99)
Glucose-Capillary: 297 mg/dL — ABNORMAL HIGH (ref 70–99)
Glucose-Capillary: 304 mg/dL — ABNORMAL HIGH (ref 70–99)

## 2020-09-12 LAB — CBC WITH DIFFERENTIAL/PLATELET
Abs Immature Granulocytes: 0.59 10*3/uL — ABNORMAL HIGH (ref 0.00–0.07)
Basophils Absolute: 0 10*3/uL (ref 0.0–0.1)
Basophils Relative: 0 %
Eosinophils Absolute: 0 10*3/uL (ref 0.0–0.5)
Eosinophils Relative: 0 %
HCT: 29.5 % — ABNORMAL LOW (ref 36.0–46.0)
Hemoglobin: 8.9 g/dL — ABNORMAL LOW (ref 12.0–15.0)
Immature Granulocytes: 6 %
Lymphocytes Relative: 6 %
Lymphs Abs: 0.6 10*3/uL — ABNORMAL LOW (ref 0.7–4.0)
MCH: 30.1 pg (ref 26.0–34.0)
MCHC: 30.2 g/dL (ref 30.0–36.0)
MCV: 99.7 fL (ref 80.0–100.0)
Monocytes Absolute: 0.2 10*3/uL (ref 0.1–1.0)
Monocytes Relative: 2 %
Neutro Abs: 8.5 10*3/uL — ABNORMAL HIGH (ref 1.7–7.7)
Neutrophils Relative %: 86 %
Platelets: 81 10*3/uL — ABNORMAL LOW (ref 150–400)
RBC: 2.96 MIL/uL — ABNORMAL LOW (ref 3.87–5.11)
RDW: 18 % — ABNORMAL HIGH (ref 11.5–15.5)
WBC: 9.9 10*3/uL (ref 4.0–10.5)
nRBC: 2.8 % — ABNORMAL HIGH (ref 0.0–0.2)

## 2020-09-12 LAB — BASIC METABOLIC PANEL
Anion gap: 4 — ABNORMAL LOW (ref 5–15)
BUN: 14 mg/dL (ref 6–20)
CO2: 31 mmol/L (ref 22–32)
Calcium: 8.5 mg/dL — ABNORMAL LOW (ref 8.9–10.3)
Chloride: 107 mmol/L (ref 98–111)
Creatinine, Ser: 0.39 mg/dL — ABNORMAL LOW (ref 0.44–1.00)
GFR, Estimated: >60 mL/min (ref >60)
Glucose, Bld: 190 mg/dL — ABNORMAL HIGH (ref 70–99)
Potassium: 4.2 mmol/L (ref 3.5–5.1)
Sodium: 142 mmol/L (ref 135–145)

## 2020-09-12 MED ORDER — TRAVASOL 10 % IV SOLN
INTRAVENOUS | Status: AC
  Filled 2020-09-12: qty 1198.8

## 2020-09-12 MED ORDER — BOOST / RESOURCE BREEZE PO LIQD CUSTOM
1.0000 | Freq: Three times a day (TID) | ORAL | Status: DC
  Administered 2020-09-12 – 2020-09-18 (×5): 1 via ORAL

## 2020-09-12 NOTE — Progress Notes (Signed)
   09/12/20 1628  Assess: MEWS Score  BP 112/86  Pulse Rate (!) 115  ECG Heart Rate (!) 112  Resp 19  SpO2 100 %  O2 Device Room Air  Assess: MEWS Score  MEWS Temp 0  MEWS Systolic 0  MEWS Pulse 2  MEWS RR 0  MEWS LOC 0  MEWS Score 2  MEWS Score Color Yellow  Assess: if the MEWS score is Yellow or Red  Were vital signs taken at a resting state? Yes  Focused Assessment Change from prior assessment (see assessment flowsheet)  Early Detection of Sepsis Score *See Row Information* Medium  MEWS guidelines implemented *See Row Information* Yes  Treat  MEWS Interventions Administered prn meds/treatments  Pain Scale 0-10  Pain Score 6  Take Vital Signs  Increase Vital Sign Frequency  Yellow: Q 2hr X 2 then Q 4hr X 2, if remains yellow, continue Q 4hrs  Escalate  MEWS: Escalate Yellow: discuss with charge nurse/RN and consider discussing with provider and RRT  Notify: Charge Nurse/RN  Name of Charge Nurse/RN Notified Sarah, RN  Date Charge Nurse/RN Notified 09/12/20  Time Charge Nurse/RN Notified 1628

## 2020-09-12 NOTE — TOC Progression Note (Signed)
Transition of Care [facility]) - Progression Note    Patient Details  Name: SEBASTIANA WUEST MRN: [id number] Date of Birth: 13-Oct-1960  Transition of [facility] CM/SW Contact  Carles Collet, RN Phone Number: 09/12/2020, 12:45 PM  Clinical Narrative:   Patient with colostomy, wound VAC, and requiring TPN to supplement po. NGT clamping trials today and advancement to begin clear liquids. TOC following for SNF placement. Barrier- TPN and NGT. Patient can be placed onceshe meets goal PO feeds.    Expected Discharge Plan: [place] Barriers to Discharge: Insurance Authorization,Continued Medical Work up  Expected Discharge Plan and Services Expected Discharge Plan: [place] In-house Referral: Clinical Social Work   Post Acute Care Choice: [place] Living arrangements for the past 2 months: [place] Determinants of Health (SDOH) Interventions    Readmission Risk Interventions Readmission Risk Prevention Plan 09/04/2020  Transportation Screening Complete  Medication Review [employer]) Referral to Pharmacy  PCP or Specialist appointment within 3-5 days of discharge Complete  HRI or Home Care Consult Complete  SW Recovery Care/Counseling Consult Complete  Palliative Care Screening Not [place] Complete  Some recent data might be hidden

## 2020-09-12 NOTE — Plan of Care (Signed)
  Problem: Education: Goal: Knowledge of General Education information will improve Description: Including pain rating scale, medication(s)/side effects and non-pharmacologic comfort measures Outcome: Progressing   Problem: Clinical Measurements: Goal: Ability to maintain clinical measurements within normal limits will improve Outcome: Progressing Goal: Will remain free from infection Outcome: Progressing Goal: Diagnostic test results will improve Outcome: Progressing Goal: Cardiovascular complication will be avoided Outcome: Progressing   Problem: Nutrition: Goal: Adequate nutrition will be maintained Outcome: Progressing   Problem: Pain Managment: Goal: General experience of comfort will improve Outcome: Progressing   Problem: Safety: Goal: Ability to remain free from injury will improve Outcome: Progressing   Problem: Skin Integrity: Goal: Risk for impaired skin integrity will decrease Outcome: Progressing

## 2020-09-12 NOTE — Progress Notes (Signed)
Nutrition Follow-up  DOCUMENTATION CODES:   Not applicable  INTERVENTION:    Continue TPN dosing per Pharmacy to meet nutrition needs until diet advanced to solids and good intake established.  Resume Boost Breeze po TID, each supplement provides 250 kcal and 9 grams of protein  NUTRITION DIAGNOSIS:   Inadequate oral intake related to altered GI function as evidenced by other (comment) (NPO/Clear liquid diet).  Ongoing  GOAL:   Patient will meet greater than or equal to 90% of their needs   Met with TPN  MONITOR:   PO intake,Supplement acceptance,Diet advancement,Labs,Skin,I & O's  REASON FOR ASSESSMENT:   NPO/Clear Liquid Diet,Consult New TPN/TNA  ASSESSMENT:   60 yo female admitted with frank rectal bleeding and hypotension. 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   4/13 - S/P coil embolization of the distal [place] branch of the superior sigmoidal artery. 4/16 - Developed severe abdominal pain. CT showed visceral perforation. S/P emergent ex lap, found to have necrotic sigmoid colon. 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   NG tube removed this morning. Diet advancing to clear liquids today. Will resume Boost Breeze PO supplement.  VAC in place to abdominal incision: no output documented for the past 24 hours.  No colostomy output documented past 24 hours.  Stool in ostomy bag this morning.   TPN increasing to 90 ml/h with today's bag to provide 2160 ml, 1954 kcal, 120 gm protein per day to meet 100% of estimated kcal and protein needs.   Labs reviewed.  CBG: [phone]  Medications reviewed and include Dulcolax suppository via stoma, Colace, Novolog SSI, Novolog 2 units q 4 hours, Protonix, solu-medrol.  IVF: NS at 20 ml/h  Lowest weight since admission 79.5 kg on 4/14. Currently 85.5 kg (increased with swelling/edema). Mild  pitting edema to BLE present.   Diet Order:   Diet Order            Diet clear liquid Room service appropriate? No; Fluid consistency: Thin  Diet effective now                 EDUCATION NEEDS:   Not appropriate for education at this time  Skin:  Skin Assessment: Skin Integrity Issues: Skin Integrity Issues:: Other (Comment),Incisions,Wound VAC Wound Vac: abdominal incision Incisions: R chest; abdomen Other: skin tear R thigh  Last BM:  4/27 (ostomy)  Height:   Ht Readings from Last 1 Encounters:  08/30/20 _0  (1.6 m)    Weight:   Wt Readings from Last 1 Encounters:  09/12/20 85.5 kg    Ideal Body Weight:  52.3 kg  BMI:  Body mass index is 33.39 kg/m.  Estimated Nutritional Needs:   Kcal:  1900-2100  Protein:  110-125 gm  Fluid:  >/= 2 L    Lucas Mallow, RD, LDN, CNSC Please refer to Amion for contact information.

## 2020-09-12 NOTE — Progress Notes (Signed)
Progress Note  11 Days Post-Op  Subjective: Patient reports discomfort from NGT. She is having more stool output and would like to try some soup or broth today.   Objective: Vital signs in last 24 hours: Temp:  [97.8 F (36.6 C)-98.2 F (36.8 C)] 98.2 F (36.8 C) (04/27 0400) Pulse Rate:  [98-118] 100 (04/27 0400) Resp:  [18-20] 18 (04/27 0330) BP: (100-129)/(77-95) 127/86 (04/27 0330) SpO2:  [94 %-100 %] 100 % (04/27 0330) Weight:  [85.5 kg] 85.5 kg (04/27 0347) Last BM Date: 09/01/20  Intake/Output from previous day: 04/26 0701 - 04/27 0700 In: 3775.1 [I.V.:3775.1] Out: 2200 [Urine:1400; Emesis/NG output:800] Intake/Output this shift: No intake/output data recorded.  PE: General: pleasant, WD,obesefemale who is laying in bed in NAD Heart: sinus tachy in the low 100s  Lungs: CTAB, no wheezes, rhonchi, or rales noted. Respiratory effort nonlabored Abd: soft,appropriately ttp,less distended, BS hypoactive, midline wound with false bottom which was separated but no purulent drainage or fascial dehiscence. Stoma with separation inferomedially and retracted but pink and viable with firm brown stool present, less tight at fascial level    Lab Results:  Recent Labs    09/11/20 0240 09/12/20 0756  WBC 10.6* 9.9  HGB 8.7* 8.9*  HCT 28.8* 29.5*  PLT 63* 81*   BMET Recent Labs    09/10/20 0355 09/11/20 0240  NA 139 138  K 3.5 4.5  CL 102 103  CO2 33* 30  GLUCOSE 223* 227*  BUN 13 13  CREATININE 0.36* 0.39*  CALCIUM 8.3* 8.1*   PT/INR No results for input(s): LABPROT, INR in the last 72 hours. CMP     Component Value Date/Time   NA 138 09/11/2020 0240   NA 129 (L) 08/06/2020 1003   K 4.5 09/11/2020 0240   CL 103 09/11/2020 0240   CO2 30 09/11/2020 0240   GLUCOSE 227 (H) 09/11/2020 0240   BUN 13 09/11/2020 0240   BUN 6 08/06/2020 1003   CREATININE 0.39 (L) 09/11/2020 0240   CREATININE 0.85 08/02/2015 1710   CALCIUM 8.1 (L) 09/11/2020 0240   PROT  4.3 (L) 09/10/2020 0355   PROT 4.9 (L) 08/06/2020 1003   ALBUMIN 2.1 (L) 09/10/2020 0355   ALBUMIN 3.2 (L) 08/06/2020 1003   AST 20 09/10/2020 0355   ALT 23 09/10/2020 0355   ALKPHOS 175 (H) 09/10/2020 0355   BILITOT 0.5 09/10/2020 0355   BILITOT 0.4 08/06/2020 1003   GFRNONAA >60 09/11/2020 0240   GFRNONAA 78 08/02/2015 1710   GFRAA >60 01/17/2020 0546   GFRAA >89 08/02/2015 1710   Lipase     Component Value Date/Time   LIPASE 64 (H) 07/23/2020 0432       Studies/Results: DG Abd 1 View  Result Date: 09/10/2020 CLINICAL DATA:  NG tube placement EXAM: ABDOMEN - 1 VIEW COMPARISON:  Radiograph 09/10/2020 at 1220 hours FINDINGS: NG 2 within the stomach. Side port just below the GE junction. Dilated loops of small bowel up to 4 cm again noted. IMPRESSION: NG tube in stomach.  Minimal change. Electronically Signed   By: Suzy Bouchard M.D.   On: 09/10/2020 18:32   DG Abd 1 View  Result Date: 09/10/2020 CLINICAL DATA:  Encounter for feeding tube placement. EXAM: ABDOMEN - 1 VIEW COMPARISON:  None. FINDINGS: Distal tip of nasogastric tube is seen in expected position of proximal stomach. IMPRESSION: Distal tip of nasogastric tube seen in expected position of proximal stomach. Electronically Signed   By: Bobbe Medico.D.  On: 09/10/2020 12:55   DG Abd Portable 1V  Result Date: 09/11/2020 CLINICAL DATA:  Ileus felling gastrointestinal surgery. EXAM: PORTABLE ABDOMEN - 1 VIEW COMPARISON:  Radiograph yesterday FINDINGS: The enteric tube is been retracted, the side port is now in the region of the gastroesophageal junction. Recommend advancement of at least 2-3 cm for optimal placement. Decreased gaseous gastric distension from prior exam. Persistent gaseous distension of small bowel in the central abdomen up to 5.1 cm. Small amount of contrast in the ascending colon with small to moderate colonic stool burden. IMPRESSION: 1. Partial retraction of the enteric tube, side-port now in the  region of the gastroesophageal junction. Recommend advancement of 2-3 cm for optimal placement. 2. Persistent gaseous small bowel distension suggesting obstruction or ileus. Electronically Signed   By: Keith Rake M.D.   On: 09/11/2020 15:14    Anti-infectives: Anti-infectives (From admission, onward)   Start     Dose/Rate Route Frequency Ordered Stop   09/01/20 1930  ciprofloxacin (CIPRO) IVPB 400 mg        400 mg 200 mL/hr over 60 Minutes Intravenous Every 12 hours 09/01/20 1836 09/05/20 1927   09/01/20 1930  metroNIDAZOLE (FLAGYL) IVPB 500 mg        500 mg 100 mL/hr over 60 Minutes Intravenous Every 8 hours 09/01/20 1836 09/05/20 2124       Assessment/Plan Hx of HTN DM2 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  OSA ABL anemia- hgb 8.9, stable  - above per [facility]-   Ms. Goughnour is a 60 year old female with a history of GI bleed s/p angioembolization of a branch of the IMA on 4/13, who developed a necrotic section of proximal sigmoid colon with perforation and feculent peritonitis - S/pExploratory Laparotomy, Partial Colectomy (sigmoid), colostomy creation,Hartman's procedure on 4/16/2022by Dr. Grandville Silos - POD #11 -Ileus post-op.CT 4/24 shows possible SBO. Patient with increased stool in ostomy appliance - clamp NGT and allow clears this AM. Continue stomal suppositories - separation of stoma inferiorly - continue to monitor closely -Completed4 days post opabxdue to feculent peritonitis. Afebrile and wbc wnl (on chronic steroids). -Continue PT/OT, mobilize - recommending SNF - WOC following for ostomy education -Vac changes MWF - Pulm toilet  FEN -TPN, NGT clamped, CLD VTE -SCDs,okay for chemical prophylaxis from a general surgery standpoint ID -Cipro/Flagyl4/16- 4/20. None currently Foley - None  LOS: 14 days    Norm Parcel, [facility] Surgery 09/12/2020, 8:47 AM Please see Amion for pager number  during day hours 7:00am-4:30pm

## 2020-09-12 NOTE — Consult Note (Signed)
[place] Nurse wound follow up Patient receiving care in [facility] 5W36. PA, Barkley Boards at bedside. Wound type: surgical Measurement: 16.5 cm x 4.2 cm x 5.2 cm--depth at most inferior aspect Wound bed: pink Drainage (amount, consistency, odor) dark serosanginous in canister Periwound: intact Dressing procedure/placement/frequency: two pieces of black foam removed. One piece of narrow black foam placed into wound bed. Drape applied, immediate seal obtained.  [place] Nurse ostomy follow up Stoma type/location: LUQ colostomy Stomal assessment/size: retracted, pink, slightly oval shaped Peristomal assessment: intact Treatment options for stomal/peristomal skin: barrier ring and convex pouch Output: formed dark brown stool Ostomy pouching: 1pc. Flexible convex, [name] [phone]  Education provided: none Enrolled patient in [place] Start Discharge program: No  Supplies in room for Friday and Monday.\ Val Riles, RN, MSN, CWOCN, CNS-BC, pager [phone]

## 2020-09-12 NOTE — Progress Notes (Signed)
Physical Therapy Treatment Patient Details Name: Rhonda [name] MRN: [id number] DOB: 1961-01-14 Today's Date: 09/12/2020    History of Present Illness 60 yo female presented to [facility] on 08/29/20 with rectal bleeding (several recent admissions for similar). Pt admitted with hemorrhagic shock.  She is s/p IR for coil embolization of intraluminal extravasation for a distal arcade branch of the IMA on 08/29/20.  She developed abdominal pain and found to have SBO with bowel perforation s/p emergent exp lap, partial colectomy, colostomy, Hartman's and wound vac application 08/01/38.  Medical hx inlcudes DM2, seizures, HTN, DDD, recurrent GIB.  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    PT Comments    Pt with eyes closed on entry and husband in room, with gentle persuasion from husband pt agreeable to working with therapy. Pt states "I just want this tube out of my nose." Pt continues to be limited in safe mobility by increased pain in her abdomen and back with movement, in presence of decreased strength and endurance. Pt with poor carryover from prior session with logrolling, but is able to follow commands for proper technique. Pt requires modAx2 for coming to seated EoB. Pt with c/o dizziness however BP in line with prior measurement of 153/78. Dizziness dissipated quickly and pt required maxAx2 for coming to standing. Pt unable to achieve fully upright due to abdominal pain and requested to return to bed. Pt maxAx2 for return to supine. Surgeon in room at end of session. D/c plans remain appropriate at this time. PT will continue to follow acutely.    Follow Up Recommendations  SNF     Equipment Recommendations  Other (comment)       Precautions / Restrictions Precautions Precautions: Fall Precaution Comments: Abdomen incision with wound vac, NG tube    Mobility  Bed Mobility Overal bed mobility: Needs Assistance Bed Mobility: Supine to Sit;Sit to  Supine Rolling: Mod assist Sidelying to sit: Mod assist;+2 for safety/equipment   Sit to supine: Max assist;+2 for physical assistance   General bed mobility comments: pt reports not remembering technique to come to EoB, but once started able to come on side and help with management of LE off bed, require most assist to bring trunk to upright, c/o dizziness but BP stable, and dizziness dissipated with sitting, requires maxAx2 for coming back into supine after standing due to increased abdominal pain.    Transfers Overall transfer level: Needs assistance Equipment used: Rolling walker (2 wheeled) Transfers: Sit to/from Stand Sit to Stand: Max assist;+2 physical assistance         General transfer comment: maxAx2 for assist to come to standing with use of bed pad, unable to come to fully erect due to increased abdominal pain. request to return to supine      Balance Overall balance assessment: Needs assistance Sitting-balance support: Bilateral upper extremity supported Sitting balance-[name]: Fair     Standing balance support: Bilateral upper extremity supported Standing balance-[name]: Zero Standing balance comment: unable to achieve fully upright                            Cognition Arousal/Alertness: Awake/alert;Lethargic Behavior During Therapy: WFL for tasks assessed/performed;Flat affect Overall Cognitive Status: History of cognitive impairments - at baseline  General Comments: pt agreeable to therapy and motivated to work, good command follow         General Comments General comments (skin integrity, edema, etc.): husband present throughout session, very attentive and a good motivator      Pertinent Vitals/Pain Pain Assessment: Faces Faces Pain Scale: Hurts even more Pain Location: abdomen with sitting Pain Descriptors / Indicators: Grimacing;Guarding Pain Intervention(s): Limited activity  within patient's tolerance;Monitored during session;Repositioned           PT Goals (current goals can now be found in the care plan section) Acute Rehab PT Goals Patient Stated Goal: to be able to eat and drink PT Goal Formulation: With patient/family Time For Goal Achievement: 09/17/20 Potential to Achieve Goals: Good    Frequency    Min 2X/week      PT Plan Current plan remains appropriate       AM-PAC PT "6 Clicks" Mobility   Outcome Measure  Help needed turning from your back to your side while in a flat bed without using bedrails?: A Lot Help needed moving from lying on your back to sitting on the side of a flat bed without using bedrails?: A Lot Help needed moving to and from a bed to a chair (including a wheelchair)?: Total Help needed standing up from a chair using your arms (e.g., wheelchair or bedside chair)?: A Lot Help needed to walk in hospital room?: Total Help needed climbing 3-5 steps with a railing? : Total 6 Click Score: 9    End of Session   Activity Tolerance: Patient limited by pain Patient left: in bed;with call bell/phone within reach;with family/visitor present;Other (comment);with bed alarm set (surgeon in room at end of session) Nurse Communication: Mobility status;Need for lift equipment PT Visit Diagnosis: Other abnormalities of gait and mobility (R26.89);Muscle weakness (generalized) (M62.81);Pain Pain - part of body:  (abdomen and back)     Time: 0944-1000 PT Time Calculation (min) (ACUTE ONLY): 16 min  Charges:  $Therapeutic Activity: 8-22 mins                     Jisell Majer B. Migdalia Dk PT, DPT Acute Rehabilitation Services Pager [phone] Office [phone]    [place] 09/12/2020, 10:25 AM

## 2020-09-12 NOTE — Progress Notes (Addendum)
PHARMACY - TOTAL PARENTERAL NUTRITION CONSULT NOTE  Indication: Prolonged ileus  Patient Measurements: Height: _0  (160 cm) Weight: 85.5 kg (188 lb 7.9 oz) IBW/kg (Calculated) : 52.4 TPN AdjBW (KG): 59.2 Body mass index is 33.39 kg/m.  Assessment:  59 YOF who presented with necrotic section of proximal sigmoid colon with perforation and feculent peritonitis now s/p ex lap with partial sigmoid colectomy and colostomy and Hartman's procedure on 09/01/20.  Developed ileus post op and has remained NPO. Pharmacy consulted to manage TPN.  NG tube placed 4/25.   Glucose / Insulin: A1c 11.8%. CBGs improving but remain elevated in the 200s.   Novolog 2 units q4h, 39 units rSSI, 100 units insulin R in TPN  On PTA prednisone 90m BID for neurosarcoidosis >> methylpred 413mq12h  Electrolytes: K 4.5 (goal >/= 4), Phos 3.1, CO2 30, CoCa 9.6, others WNL Renal: SCr <1, BUN WNL Hepatic: AST / ALT / tbili / TG WNL.  Alk phos down to 175.  Prealbumin WNL.   Albumin 2.1 Intake / Output; MIVF: UOP 0.7 ml/kg/hr, NS at 45 ml/hr, drain [id number] Imaging: 4/24 CT: SBO, suspicious for adhesions, diverticulosis GI Surgeries / Procedures: 4/16 ex lap + hartman's procedure  Central access: PICC placed 09/06/20 TPN start date: 09/06/20   Nutritional Goals: per RD rec on 4/26 1900-2100 Kcal, 110-125 g protein Fluid   Current Nutrition:  TPN + clear liquid diet    Plan:  Increase TPN to 90 ml/hr to provide 120g AA, 59g lipid, 259g CHO for total of 1954 kcal  Electrolytes in TPN: Na 69m65m, K 50mE34m Ca 4mEq/13mMg 7mEq/L45mhos 15mmol/8ml:Ac 1:1 Add standard MVI and trace elements to TPN Continue resistant SSI Q4H, increase regular insulin in TPN to 135 units, Novolog 2 units Q4H (hold for CBG<120),  F/U AM labs, CBGs F/u steroid potential switch IV to PO tomorrow AM    MadelineWilson Singer PGY1 Pharmacy Resident 09/12/2020 7:54 AM

## 2020-09-12 NOTE — Progress Notes (Signed)
PROGRESS NOTE    Rhonda [name]  LNL:[id number] DOB: 1960-10-12 DOA: 08/29/2020 PCP: [facility].    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   Significant events: 3/8, admission with rectal bleeding, EGD and colonoscopy negative.   4/4-4/6 admitted with hematochezia, symptomatically treated, capsule endoscopy and discharged home. 4/13 presented with hematochezia, CT angiogram with acute bleeding in the descending colon status post coil embolization of the distal [place] branch of the superior sigmoidal artery. Insulin infusion for high blood sugars.  Admitted to ICU. 4/15, transferred out of ICU.  4/16, afternoon after eating lunch, patient had severe abdominal pain.  Relieved with pain medication.  A CT scan showed visceral perforation.  Underwent urgent exploratory laparotomy and found to have necrotic sigmoid colon.  Status post Jeanette Caprice procedure with colostomy and wound VAC for midline incision. 4/22, no return of bowel function yet.  on TPN.  Continues to have ileus, NG tube decompression 4/25. 4/26, some stool noted in the colostomy. 4/27, NG tube clamping trial and clears.   Assessment & Plan:   Active Problems:   GI bleed   Hypotension   Rectal bleeding   AKI (acute kidney injury) ([place])  Perforated sigmoid colon with history of diverticulosis and multiple procedures.  IMA embolization 4/13. Emergency exploratory laparotomy and Hartman's procedure 4/16. Prolonged ileus.   Continues ileus, NG tube for decompression 4/25. Followed by surgery.  Increasing mobility as much. Peritoneal cultures negative.  Completed antibiotic therapy. PICC line and TPN.  Fluid  management, electrolytes replaced by pharmacy as per TPN protocol. Patient with some improvement of bowel function today, started on clear with NG tube clamping.  Acute lower GI bleeding, diverticular bleeding.  Hemorrhagic shock secondary to acute GI bleeding and anemia of blood loss: Hemoglobin is stable since surgery.  Acute renal failure: Due to #1.  Improved and normalized.  On maintenance IV fluid along with TPN.  Type 2 diabetes with hyperglycemia: On TPN.  On increasing dose of insulin.  Neurosarcoidosis/chronic debility and wheelchair-bound status:  Patient on prednisone 40 mg twice a day at home.  Given Solu-Medrol perioperative.  Unreliable oral intake, will change to IV Solu-Medrol 40 mg twice a day until she has reliable oral intake to avoid corticosteroid insufficiency. Occasional delirium, symptomatic treatment with Ativan.  Melatonin at night.    Obstructive sleep apnea: Declined CPAP.  Not using.  Hypertension: On metoprolol.  Heart rate acceptable.  If unable to use by mouth, will use as needed IV metoprolol.  Hypochloremic hyponatremia: Normalizing.   Hypomagnesemia/hypokalemia: Replaced by pharmacy per TPN protocol.  DVT prophylaxis: SCDs Start: 08/29/20 2256   Code Status: Full code Family Communication: None today. Disposition Plan: Status is: Inpatient  Remains inpatient appropriate because:Inpatient level of care appropriate due to severity of illness   Dispo: The patient is from: Home              Anticipated d/c is to: Skilled nursing facility.              Patient currently is not medically stable to d/c.   Difficult to place patient no         Consultants:   PCCM  Gastroenterology  Interventional radiology  Surgery.  Procedures:   Embolization inferior mesenteric artery 4/13  Hartman's procedure 4/16.  Antimicrobials:  Antibiotics Given (last 72 hours)    None       Subjective: Patient seen and examined.  She was just  seen by surgery.  She tells me that she can have liquid and wants to try some broth and was excited about it.  Slept well last night.  Denies any abdominal pain or nausea.  NG tube was just clamped and she denies any distention. There is no stool in the colostomy, however surgical note stated formed stool present.  Objective: Vitals:   09/12/20 0030 09/12/20 0330 09/12/20 0347 09/12/20 0400  BP: 123/87 127/86    Pulse: (!) 109 98  100  Resp: 20 18    Temp: 98.1 F (36.7 C) 98.2 F (36.8 C)  98.2 F (36.8 C)  TempSrc: Oral Oral  Oral  SpO2:  100%    Weight:   85.5 kg   Height:        Intake/Output Summary (Last 24 hours) at 09/12/2020 0948 Last data filed at 09/12/2020 0600 Gross per 24 hour  Intake 3775.08 ml  Output 2100 ml  Net 1675.08 ml   Filed Weights   09/08/20 0500 09/11/20 0500 09/12/20 0347  Weight: 90.3 kg 85.7 kg 85.5 kg    Examination: General: Frail and debilitated.  Not in any distress.  Cushingoid.  On room air. Cardiovascular: S1-S2 normal.  No edema.  No added sounds. Respiratory: No added sounds. Gastrointestinal: Soft, mildly distended.  No obvious tenderness except incision line tenderness.  Bowel sounds present.  Colostomy empty.  Midline surgical incision clean and dry.  Wound VAC in place. Ext: No edema.  No joint swelling. Neuro: Patient has cushingoid face, she does not have any focal deficit, however she has generalized weakness and unable to bear weight on lower extremities due to chronic generalized weakness.   Data Reviewed: I have personally reviewed following labs and imaging studies  CBC: Recent Labs  Lab 09/08/20 0305 09/09/20 0354 09/10/20 0355 09/11/20 0240 09/12/20 0756  WBC 10.0 11.5* 11.4* 10.6* 9.9  NEUTROABS 8.0* 10.0* 11.2* 9.5* 8.5*  HGB 10.3* 9.0* 9.1* 8.7* 8.9*  HCT 34.3* 29.7* 30.1* 28.8* 29.5*  MCV 98.0 98.0 98.0 98.3 99.7  PLT 115* 88* 73* 63* 81*   Basic Metabolic Panel: Recent Labs  Lab 09/07/20 0444  09/08/20 0305 09/09/20 0354 09/10/20 0355 09/11/20 0240 09/12/20 0756  NA 141 139 135 139 138 142  K 3.9 3.3* 3.8 3.5 4.5 4.2  CL 103 103 101 102 103 107  CO2 28 26 28  33* 30 31  GLUCOSE 274* 338* 291* 223* 227* 190*  BUN 8 9 15 13 13 14   CREATININE 0.49 0.50 0.47 0.36* 0.39* 0.39*  CALCIUM 8.6* 8.9 8.5* 8.3* 8.1* 8.5*  MG 1.9 1.9 2.1 2.0 2.1  --   PHOS 2.3* 3.2 2.9 2.1* 3.1  --    GFR: Estimated Creatinine Clearance: 78.4 mL/min (A) (by C-G formula based on SCr of 0.39 mg/dL (L)). Liver Function Tests: Recent Labs  Lab 09/07/20 0444 09/10/20 0355  AST 15 20  ALT 27 23  ALKPHOS 217* 175*  BILITOT 0.8 0.5  PROT 4.7* 4.3*  ALBUMIN 2.2* 2.1*   No results for input(s): LIPASE, AMYLASE in the last 168 hours. No results for input(s): AMMONIA in the last 168 hours. Coagulation Profile: No results for input(s): INR, PROTIME in the last 168 hours. Cardiac Enzymes: No results for input(s): CKTOTAL, CKMB, CKMBINDEX, TROPONINI in the last 168 hours. BNP (  last 3 results) No results for input(s): PROBNP in the last 8760 hours. HbA1C: No results for input(s): HGBA1C in the last 72 hours. CBG: Recent Labs  Lab 09/11/20 1611 09/11/20 2047 09/12/20 0057 09/12/20 0333 09/12/20 0836  GLUCAP 150* 184* 248* 219* 168*   Lipid Profile: Recent Labs    09/10/20 0355  TRIG 144   Thyroid Function Tests: No results for input(s): TSH, T4TOTAL, FREET4, T3FREE, THYROIDAB in the last 72 hours. Anemia Panel: No results for input(s): VITAMINB12, FOLATE, FERRITIN, TIBC, IRON, RETICCTPCT in the last 72 hours. Sepsis Labs: No results for input(s): PROCALCITON, LATICACIDVEN in the last 168 hours.  No results found for this or any previous visit (from the past 240 hour(s)).       Radiology Studies: DG Abd 1 View  Result Date: 09/10/2020 CLINICAL DATA:  NG tube placement EXAM: ABDOMEN - 1 VIEW COMPARISON:  Radiograph 09/10/2020 at 1220 hours FINDINGS: NG 2 within the stomach. Side  port just below the GE junction. Dilated loops of small bowel up to 4 cm again noted. IMPRESSION: NG tube in stomach.  Minimal change. Electronically Signed   By: Suzy Bouchard M.D.   On: 09/10/2020 18:32   DG Abd 1 View  Result Date: 09/10/2020 CLINICAL DATA:  Encounter for feeding tube placement. EXAM: ABDOMEN - 1 VIEW COMPARISON:  None. FINDINGS: Distal tip of nasogastric tube is seen in expected position of proximal stomach. IMPRESSION: Distal tip of nasogastric tube seen in expected position of proximal stomach. Electronically Signed   By: Marijo Conception M.D.   On: 09/10/2020 12:55   DG Abd Portable 1V  Result Date: 09/11/2020 CLINICAL DATA:  Ileus felling gastrointestinal surgery. EXAM: PORTABLE ABDOMEN - 1 VIEW COMPARISON:  Radiograph yesterday FINDINGS: The enteric tube is been retracted, the side port is now in the region of the gastroesophageal junction. Recommend advancement of at least 2-3 cm for optimal placement. Decreased gaseous gastric distension from prior exam. Persistent gaseous distension of small bowel in the central abdomen up to 5.1 cm. Small amount of contrast in the ascending colon with small to moderate colonic stool burden. IMPRESSION: 1. Partial retraction of the enteric tube, side-port now in the region of the gastroesophageal junction. Recommend advancement of 2-3 cm for optimal placement. 2. Persistent gaseous small bowel distension suggesting obstruction or ileus. Electronically Signed   By: Keith Rake M.D.   On: 09/11/2020 15:14        Scheduled Meds: . bisacodyl  10 mg Rectal Daily  . Chlorhexidine Gluconate Cloth  6 each Topical Daily  . docusate  100 mg Per Tube BID  . gabapentin  100 mg Oral TID  . insulin aspart  0-20 Units Subcutaneous Q4H  . insulin aspart  2 Units Subcutaneous Q4H  . lidocaine  1 patch Transdermal Q24H  . mouth rinse  15 mL Mouth Rinse BID  . melatonin  9 mg Per Tube QHS  . methylPREDNISolone (SOLU-MEDROL) injection  40 mg  Intravenous Q12H  . metoprolol tartrate  25 mg Oral BID  . pantoprazole (PROTONIX) IV  40 mg Intravenous Q24H   Continuous Infusions: . sodium chloride 20 mL/hr at 09/12/20 0600  . TPN ADULT (ION) 75 mL/hr at 09/12/20 0600     LOS: 14 days    Time spent: 30 minutes    Barb Merino, MD [facility] Pager [phone]

## 2020-09-13 DIAGNOSIS — E861 Hypovolemia: Secondary | ICD-10-CM | POA: Diagnosis not present

## 2020-09-13 DIAGNOSIS — K625 Hemorrhage of anus and rectum: Secondary | ICD-10-CM | POA: Diagnosis not present

## 2020-09-13 DIAGNOSIS — I9589 Other hypotension: Secondary | ICD-10-CM | POA: Diagnosis not present

## 2020-09-13 LAB — COMPREHENSIVE METABOLIC PANEL
ALT: 23 U/L (ref 0–44)
AST: 21 U/L (ref 15–41)
Albumin: 2.1 g/dL — ABNORMAL LOW (ref 3.5–5.0)
Alkaline Phosphatase: 142 U/L — ABNORMAL HIGH (ref 38–126)
Anion gap: 6 (ref 5–15)
BUN: 17 mg/dL (ref 6–20)
CO2: 28 mmol/L (ref 22–32)
Calcium: 8.5 mg/dL — ABNORMAL LOW (ref 8.9–10.3)
Chloride: 101 mmol/L (ref 98–111)
Creatinine, Ser: 0.45 mg/dL (ref 0.44–1.00)
GFR, Estimated: >60 mL/min (ref >60)
Glucose, Bld: 214 mg/dL — ABNORMAL HIGH (ref 70–99)
Potassium: 4.2 mmol/L (ref 3.5–5.1)
Sodium: 135 mmol/L (ref 135–145)
Total Bilirubin: 0.5 mg/dL (ref 0.3–1.2)
Total Protein: 4.5 g/dL — ABNORMAL LOW (ref 6.5–8.1)

## 2020-09-13 LAB — CBC WITH DIFFERENTIAL/PLATELET
Abs Immature Granulocytes: 0.84 10*3/uL — ABNORMAL HIGH (ref 0.00–0.07)
Basophils Absolute: 0 10*3/uL (ref 0.0–0.1)
Basophils Relative: 0 %
Eosinophils Absolute: 0 10*3/uL (ref 0.0–0.5)
Eosinophils Relative: 0 %
HCT: 28 % — ABNORMAL LOW (ref 36.0–46.0)
Hemoglobin: 8.3 g/dL — ABNORMAL LOW (ref 12.0–15.0)
Immature Granulocytes: 9 %
Lymphocytes Relative: 10 %
Lymphs Abs: 0.9 10*3/uL (ref 0.7–4.0)
MCH: 29.7 pg (ref 26.0–34.0)
MCHC: 29.6 g/dL — ABNORMAL LOW (ref 30.0–36.0)
MCV: 100.4 fL — ABNORMAL HIGH (ref 80.0–100.0)
Monocytes Absolute: 0.3 10*3/uL (ref 0.1–1.0)
Monocytes Relative: 3 %
Neutro Abs: 6.8 10*3/uL (ref 1.7–7.7)
Neutrophils Relative %: 78 %
Platelets: 95 10*3/uL — ABNORMAL LOW (ref 150–400)
RBC: 2.79 MIL/uL — ABNORMAL LOW (ref 3.87–5.11)
RDW: 18 % — ABNORMAL HIGH (ref 11.5–15.5)
WBC: 8.9 10*3/uL (ref 4.0–10.5)
nRBC: 7.5 % — ABNORMAL HIGH (ref 0.0–0.2)

## 2020-09-13 LAB — MAGNESIUM: Magnesium: 2 mg/dL (ref 1.7–2.4)

## 2020-09-13 LAB — PHOSPHORUS: Phosphorus: 3.2 mg/dL (ref 2.5–4.6)

## 2020-09-13 LAB — GLUCOSE, CAPILLARY
Glucose-Capillary: 241 mg/dL — ABNORMAL HIGH (ref 70–99)
Glucose-Capillary: 185 mg/dL — ABNORMAL HIGH (ref 70–99)
Glucose-Capillary: 196 mg/dL — ABNORMAL HIGH (ref 70–99)
Glucose-Capillary: 216 mg/dL — ABNORMAL HIGH (ref 70–99)
Glucose-Capillary: 216 mg/dL — ABNORMAL HIGH (ref 70–99)

## 2020-09-13 MED ORDER — TRAVASOL 10 % IV SOLN: INTRAVENOUS | Status: DC

## 2020-09-13 MED ORDER — OXYCODONE HCL 5 MG PO TABS
5.0000 mg | ORAL_TABLET | ORAL | Status: DC | PRN
  Administered 2020-09-13: 5 mg via ORAL
  Administered 2020-09-14: 10 mg via ORAL
  Administered 2020-09-15: 5 mg via ORAL
  Administered 2020-09-15: 10 mg via ORAL
  Administered 2020-09-17 – 2020-09-19 (×2): 5 mg via ORAL
  Filled 2020-09-13 (×2): qty 2
  Filled 2020-09-13 (×4): qty 1

## 2020-09-13 MED ORDER — FAT EMUL FISH OIL/PLANT BASED 20% (SMOFLIPID)IV EMUL
INTRAVENOUS | Status: AC
  Filled 2020-09-13: qty 1198.8

## 2020-09-13 MED ORDER — POLYETHYLENE GLYCOL 3350 17 G PO PACK
17.0000 g | PACK | Freq: Every day | ORAL | Status: DC
  Administered 2020-09-13: 17 g via ORAL
  Filled 2020-09-13 (×2): qty 1

## 2020-09-13 MED ORDER — HYDROMORPHONE HCL 1 MG/ML IJ SOLN
0.5000 mg | INTRAMUSCULAR | Status: DC | PRN
Start: 2020-09-13 — End: 2020-09-21
  Administered 2020-09-13 – 2020-09-16 (×6): 0.5 mg via INTRAVENOUS
  Filled 2020-09-13 (×7): qty 0.5

## 2020-09-13 MED ORDER — INSULIN ASPART 100 UNIT/ML ~~LOC~~ SOLN
6.0000 [IU] | SUBCUTANEOUS | Status: DC
  Administered 2020-09-13 – 2020-09-16 (×16): 6 [IU] via SUBCUTANEOUS

## 2020-09-13 NOTE — Progress Notes (Signed)
Physical Therapy Treatment Patient Details Name: Rhonda [name] MRN: [id number] DOB: 1960-07-04 Today's Date: 09/13/2020    History of Present Illness 60 yo female presented to [facility] on 08/29/20 with rectal bleeding (several recent admissions for similar). Pt admitted with hemorrhagic shock.  She is s/p IR for coil embolization of intraluminal extravasation for a distal arcade branch of the IMA on 08/29/20.  She developed abdominal pain and found to have SBO with bowel perforation s/p emergent exp lap, partial colectomy, colostomy, Hartman's and wound vac application 11/01/05.  Medical hx inlcudes DM2, seizures, HTN, DDD, recurrent GIB.  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    PT Comments    Pt continues to make good progress towards her goals today. Pt continues to be limited by pain however was premedicated prior to session. Pt is mod-maxAx2 for bed mobility. Pt attempted sit>stand with max Ax2 and unable to achieve fully upright on first attempt. When asked pt's husband to stand in front of the walker and provide his wife with a kiss when she stood all the way up pt was able to stand fully upright x2 with maxAx2. Both pt and husband appreciated opportunity. D/c plan remains appropriate at this time. PT will continue to follow acutely.    Follow Up Recommendations  SNF     Equipment Recommendations  Other (comment)       Precautions / Restrictions Precautions Precautions: Fall Precaution Comments: Abdomen incision with wound vac, NG tube Restrictions Weight Bearing Restrictions: No    Mobility  Bed Mobility Overal bed mobility: Needs Assistance Bed Mobility: Supine to Sit;Sit to Supine Rolling: Mod assist Sidelying to sit: Mod assist;+2 for safety/equipment   Sit to supine: Max assist;+2 for physical assistance   General bed mobility comments: pt requires less cuing for technique today and is able to come to EoB with modA for LE  management to floor and trunk to upright    Transfers Overall transfer level: Needs assistance Equipment used: Rolling walker (2 wheeled) Transfers: Sit to/from Stand Sit to Stand: Max assist;+2 physical assistance         General transfer comment: maxAx2 with use of bed pad and pt able to come to fully upright 2/3 attempts standing long enough to kiss her husband x2        Balance Overall balance assessment: Needs assistance Sitting-balance support: Bilateral upper extremity supported Sitting balance-[name]: Fair     Standing balance support: Bilateral upper extremity supported Standing balance-[name]: Zero Standing balance comment: requires max outside assist                            Cognition Arousal/Alertness: Awake/alert;Lethargic Behavior During Therapy: WFL for tasks assessed/performed;Flat affect Overall Cognitive Status: History of cognitive impairments - at baseline                                 General Comments: pt agreeable to therapy and motivated to work, good command follow         General Comments General comments (skin integrity, edema, etc.): husband present and very motivating force throughout session      Pertinent Vitals/Pain Pain Assessment: Faces Faces Pain Scale: Hurts little more Pain Location: abdomen with sitting Pain Descriptors / Indicators: Grimacing;Guarding Pain Intervention(s): Limited activity within patient's tolerance;Monitored during session;Premedicated before session;Repositioned  PT Goals (current goals can now be found in the care plan section) Acute Rehab PT Goals Patient Stated Goal: to be able to eat and drink PT Goal Formulation: With patient/family Time For Goal Achievement: 09/17/20 Potential to Achieve Goals: Good Progress towards PT goals: Progressing toward goals    Frequency    Min 2X/week      PT Plan Current plan remains appropriate       AM-PAC  PT "6 Clicks" Mobility   Outcome Measure  Help needed turning from your back to your side while in a flat bed without using bedrails?: A Lot Help needed moving from lying on your back to sitting on the side of a flat bed without using bedrails?: A Lot Help needed moving to and from a bed to a chair (including a wheelchair)?: Total Help needed standing up from a chair using your arms (e.g., wheelchair or bedside chair)?: A Lot Help needed to walk in hospital room?: Total Help needed climbing 3-5 steps with a railing? : Total 6 Click Score: 9    End of Session   Activity Tolerance: Patient tolerated treatment well;Patient limited by pain Patient left: in bed;with call bell/phone within reach;with family/visitor present;with bed alarm set Nurse Communication: Mobility status;Need for lift equipment PT Visit Diagnosis: Other abnormalities of gait and mobility (R26.89);Muscle weakness (generalized) (M62.81);Pain Pain - part of body:  (abdomen and back)     Time: 0881-1031 PT Time Calculation (min) (ACUTE ONLY): 20 min  Charges:  $Therapeutic Activity: 8-22 mins                     Khiree Bukhari B. Migdalia Dk PT, DPT Acute Rehabilitation Services Pager [phone] Office [phone]    [place] 09/13/2020, 4:03 PM

## 2020-09-13 NOTE — Plan of Care (Signed)
  Problem: Education: Goal: Knowledge of General Education information will improve Description: Including pain rating scale, medication(s)/side effects and non-pharmacologic comfort measures Outcome: Progressing   Problem: Health Behavior/Discharge Planning: Goal: Ability to manage health-related needs will improve Outcome: Progressing   Problem: Clinical Measurements: Goal: Ability to maintain clinical measurements within normal limits will improve Outcome: Progressing Goal: Will remain free from infection Outcome: Progressing Goal: Diagnostic test results will improve Outcome: Progressing Goal: Cardiovascular complication will be avoided Outcome: Progressing   Problem: Activity: Goal: Risk for activity intolerance will decrease Outcome: Progressing   Problem: Nutrition: Goal: Adequate nutrition will be maintained Outcome: Progressing   Problem: Coping: Goal: Level of anxiety will decrease Outcome: Progressing   Problem: Elimination: Goal: Will not experience complications related to urinary retention Outcome: Progressing   Problem: Pain Managment: Goal: General experience of comfort will improve Outcome: Progressing   Problem: Safety: Goal: Ability to remain free from injury will improve Outcome: Progressing   Problem: Skin Integrity: Goal: Risk for impaired skin integrity will decrease Outcome: Progressing   Problem: Education: Goal: Ability to identify signs and symptoms of gastrointestinal bleeding will improve Outcome: Progressing   Problem: Bowel/Gastric: Goal: Will show no signs and symptoms of gastrointestinal bleeding Outcome: Progressing   Problem: Fluid Volume: Goal: Will show no signs and symptoms of excessive bleeding Outcome: Progressing   Problem: Clinical Measurements: Goal: Complications related to the disease process, condition or treatment will be avoided or minimized Outcome: Progressing

## 2020-09-13 NOTE — Progress Notes (Signed)
Progress Note  12 Days Post-Op  Subjective: Patient tolerating CLD but does report some increased abdominal pain this AM. Not a lot more stool output but putting out gas from stoma.   Objective: Vital signs in last 24 hours: Temp:  [97.4 F (36.3 C)-99.1 F (37.3 C)] 98.7 F (37.1 C) (04/28 0319) Pulse Rate:  [98-115] 109 (04/28 0319) Resp:  [12-19] 18 (04/28 0319) BP: (112-152)/(82-91) 141/86 (04/28 0319) SpO2:  [100 %] 100 % (04/28 0319) Weight:  [90.1 kg] 90.1 kg (04/28 0434) Last BM Date: 09/12/20  Intake/Output from previous day: 04/27 0701 - 04/28 0700 In: 1860 [P.O.:960; I.V.:900] Out: 1800 [Urine:1800] Intake/Output this shift: No intake/output data recorded.  PE: General: pleasant, WD,obesefemale who is laying in bed in NAD Heart: RRR Lungs: CTAB, no wheezes, rhonchi, or rales noted. Respiratory effort nonlabored Abd: soft,appropriately ttp,VAC to midline, mildly distended, stoma pink with minimal liquid stool in pouch    Lab Results:  Recent Labs    09/12/20 0756 09/13/20 0416  WBC 9.9 8.9  HGB 8.9* 8.3*  HCT 29.5* 28.0*  PLT 81* 95*   BMET Recent Labs    09/12/20 0756 09/13/20 0416  NA 142 135  K 4.2 4.2  CL 107 101  CO2 31 28  GLUCOSE 190* 214*  BUN 14 17  CREATININE 0.39* 0.45  CALCIUM 8.5* 8.5*   PT/INR No results for input(s): LABPROT, INR in the last 72 hours. CMP     Component Value Date/Time   NA 135 09/13/2020 0416   NA 129 (L) 08/06/2020 1003   K 4.2 09/13/2020 0416   CL 101 09/13/2020 0416   CO2 28 09/13/2020 0416   GLUCOSE 214 (H) 09/13/2020 0416   BUN 17 09/13/2020 0416   BUN 6 08/06/2020 1003   CREATININE 0.45 09/13/2020 0416   CREATININE 0.85 08/02/2015 1710   CALCIUM 8.5 (L) 09/13/2020 0416   PROT 4.5 (L) 09/13/2020 0416   PROT 4.9 (L) 08/06/2020 1003   ALBUMIN 2.1 (L) 09/13/2020 0416   ALBUMIN 3.2 (L) 08/06/2020 1003   AST 21 09/13/2020 0416   ALT 23 09/13/2020 0416   ALKPHOS 142 (H) 09/13/2020 0416    BILITOT 0.5 09/13/2020 0416   BILITOT 0.4 08/06/2020 1003   GFRNONAA >60 09/13/2020 0416   GFRNONAA 78 08/02/2015 1710   GFRAA >60 01/17/2020 0546   GFRAA >89 08/02/2015 1710   Lipase     Component Value Date/Time   LIPASE 64 (H) 07/23/2020 0432       Studies/Results: DG Abd Portable 1V  Result Date: 09/11/2020 CLINICAL DATA:  Ileus felling gastrointestinal surgery. EXAM: PORTABLE ABDOMEN - 1 VIEW COMPARISON:  Radiograph yesterday FINDINGS: The enteric tube is been retracted, the side port is now in the region of the gastroesophageal junction. Recommend advancement of at least 2-3 cm for optimal placement. Decreased gaseous gastric distension from prior exam. Persistent gaseous distension of small bowel in the central abdomen up to 5.1 cm. Small amount of contrast in the ascending colon with small to moderate colonic stool burden. IMPRESSION: 1. Partial retraction of the enteric tube, side-port now in the region of the gastroesophageal junction. Recommend advancement of 2-3 cm for optimal placement. 2. Persistent gaseous small bowel distension suggesting obstruction or ileus. Electronically Signed   By: Keith Rake M.D.   On: 09/11/2020 15:14    Anti-infectives: Anti-infectives (From admission, onward)   Start     Dose/Rate Route Frequency Ordered Stop   09/01/20 1930  ciprofloxacin (CIPRO) IVPB  400 mg        400 mg 200 mL/hr over 60 Minutes Intravenous Every 12 hours 09/01/20 1836 09/05/20 1927   09/01/20 1930  metroNIDAZOLE (FLAGYL) IVPB 500 mg        500 mg 100 mL/hr over 60 Minutes Intravenous Every 8 hours 09/01/20 1836 09/05/20 2124       Assessment/Plan Hx of HTN DM2 Neurosarcoidosis/chronic debility and wheelchair-bound - on chronic steroids.  OSA ABL anemia - hgb 8.3, stable  - above per [facility]-    Ms. Sturgell is a 60 year old female with a history of GI bleed s/p angioembolization of a branch of the IMA on 4/13, who developed a necrotic section of proximal  sigmoid colon with perforation and feculent peritonitis  - S/p Exploratory Laparotomy, Partial Colectomy (sigmoid), colostomy creation, Hartman's procedure on 09/01/2020 by Dr. Grandville Silos - POD #12 - Ileus post-op. CT 4/24 shows possible SBO. Patient with decreased stool in ostomy appliance but audible flatus, ok to try advancing to FLD today  - separation of stoma inferiorly - continue to monitor closely  - Completed 4 days post op abx due to feculent peritonitis. Afebrile and wbc wnl (on chronic steroids).  - Continue PT/OT, mobilize - recommending SNF - [facility] following for ostomy education - Vac changes MWF - Pulm toilet    FEN -TPN, FLD - 1/2 TPN tomorrow if tolerating FLD  VTE - SCDs, okay for chemical prophylaxis from a general surgery standpoint ID - Cipro/Flagyl 4/16 - 4/20. None currently Foley - None    LOS: 15 days    Norm Parcel, [facility] Surgery 09/13/2020, 9:16 AM Please see Amion for pager number during day hours 7:00am-4:30pm

## 2020-09-13 NOTE — Progress Notes (Addendum)
PHARMACY - TOTAL PARENTERAL NUTRITION CONSULT NOTE  Indication: Prolonged ileus  Patient Measurements: Height: _0  (160 cm) Weight: 90.1 kg (198 lb 10.2 oz) IBW/kg (Calculated) : 52.4 TPN AdjBW (KG): 59.2 Body mass index is 35.19 kg/m.  Assessment:  65 YOF who presented with necrotic section of proximal sigmoid colon with perforation and feculent peritonitis now s/p ex lap with partial sigmoid colectomy and colostomy and Hartman's procedure on 09/01/20.  Developed ileus post op and has remained NPO. Pharmacy consulted to manage TPN.  NG tube placed 4/25. Diet advanced to clear liquid 4/27 >> full liquid diet 4/28.   Glucose / Insulin: A1c 11.8%. CBGs improving but remain elevated in the 200s.   Novolog 2 units q4h, 43 units rSSI, 135 units insulin R in TPN  On PTA prednisone 22m BID for neurosarcoidosis >> methylpred 482mq12h  Electrolytes: K 4.2 (goal >/= 4), Phos 3.2, CoCa 9.6, others WNL Renal: SCr <1, BUN WNL Hepatic: AST / ALT / tbili / TG WNL.  Alk phos down to 142.  Prealbumin WNL.   Albumin 2.1 Intake / Output; MIVF: UOP 0.8 ml/kg/hr, MIVF KVO GI Imaging: 4/24 CT: SBO, suspicious for adhesions, diverticulosis GI Surgeries / Procedures: 4/16 ex lap + hartman's procedure  Central access: PICC placed 09/06/20 TPN start date: 09/06/20   Nutritional Goals: per RD rec on 4/26 1900-2100 Kcal, 110-125 g protein Fluid >2L  Current Nutrition:  TPN + full liquid diet   Boost Breeze TID - 1 given yesterday -s/w RN who reported patient has had 1 full hot chocolate, a few bites of pudding, and a few bites of grits today   Plan:  Increase TPN to 90 ml/hr to provide 120g AA, 59g lipid, 259g CHO for total of 1954 kcal  Electrolytes in TPN: Na 7053mL, K 46m68m, Ca 4mEq49m Mg 7mEq/52mPhos 15mmol33mCl:Ac 1:1 Add standard MVI and trace elements to TPN Continue resistant SSI Q4H, increase regular insulin in TPN to 135 units, Novolog 6 units Q4H (hold for CBG<120)  F/U AM labs,  CBGs F/u steroid potential switch IV to PO tomorrow AM F/u what patient is eating tomorrow   MadelinWilson SingerD PGY1 Pharmacy Resident 09/13/2020 7:29 AM

## 2020-09-13 NOTE — Progress Notes (Signed)
PROGRESS NOTE    Rhonda [name]  TML:[id number] DOB: [birth year] DOA: 08/29/2020 PCP: [place].    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   Significant events: 3/8, admission with rectal bleeding, EGD and colonoscopy negative.   4/4-4/6 admitted with hematochezia, symptomatically treated, capsule endoscopy and discharged home. 4/13 presented with hematochezia, CT angiogram with acute bleeding in the descending colon status post coil embolization of the distal [place] branch of the superior sigmoidal artery. Insulin infusion for high blood sugars.  Admitted to ICU. 4/15, transferred out of ICU.  4/16, afternoon after eating lunch, patient had severe abdominal pain.  Relieved with pain medication.  A CT scan showed visceral perforation.  Underwent urgent exploratory laparotomy and found to have necrotic sigmoid colon.  Status post Jeanette Caprice procedure with colostomy and wound VAC for midline incision. 4/22, no return of bowel function yet.  on TPN.  Continues to have ileus, NG tube decompression 4/25. 4/26, some stool noted in the colostomy. 4/27, NG tube removed and on fluid trials.   Assessment & Plan:   Active Problems:   GI bleed   Hypotension   Rectal bleeding   AKI (acute kidney injury) ([place])  Perforated sigmoid colon with history of diverticulosis and multiple procedures.  IMA embolization 4/13. Emergency exploratory laparotomy and Hartman's procedure 4/16. Prolonged ileus.  Continues ileus, NG tube for decompression 4/25. Followed by surgery.  Increasing mobility as much. Peritoneal cultures negative.  Completed antibiotic therapy. PICC line and TPN.  Fluid  management, electrolytes replaced by pharmacy as per TPN protocol. Patient with some improvement of bowel function today, tolerated clears, she wants to try different type of liquids, advance to full liquid diet as per surgery.  Acute lower GI bleeding, diverticular bleeding.  Hemorrhagic shock secondary to acute GI bleeding and anemia of blood loss: Hemoglobin is stable since surgery.  Acute renal failure: Due to #1.  Improved and normalized.  On maintenance IV fluid along with TPN.  Type 2 diabetes with hyperglycemia: On TPN.  On increasing dose of insulin.  Neurosarcoidosis/chronic debility and wheelchair-bound status:  Patient on prednisone 40 mg twice a day at home.  Given Solu-Medrol perioperative.  Unreliable oral intake and absorption.  Continue Solu-Medrol today and will keep on IV Solu-Medrol until reliable oral intake and bowel function. Occasional delirium, symptomatic treatment with Ativan.  Melatonin at night.    Obstructive sleep apnea: Declined CPAP.  Not using.  Hypertension: On metoprolol.  Heart rate acceptable.  If unable to use by mouth, will use as needed IV metoprolol.  Hypochloremic hyponatremia: Normalizing.   Hypomagnesemia/hypokalemia: Replaced by pharmacy per TPN protocol.  DVT prophylaxis: SCDs Start: 08/29/20 2256   Code Status: Full code Family Communication: None today. Disposition Plan: Status is: Inpatient  Remains inpatient appropriate because:Inpatient level of care appropriate due to severity of illness   Dispo: The patient is from: Home              Anticipated d/c is to: Skilled nursing facility.              Patient currently is not medically stable to d/c.   Difficult to place patient no         Consultants:   PCCM  Gastroenterology  Interventional radiology  Surgery.  Procedures:   Embolization inferior mesenteric artery 4/13  Hartman's procedure 4/16.  Antimicrobials:  Antibiotics Given (last 72 hours)    None        Subjective: Patient seen and examined.  Examined along with surgery team.  She would like to try some ginger ale and orange juice.  Denies any nausea or vomiting since NGT was taken out yesterday, however has some distention.  No stool in the colostomy, however patient herself hear some bowel rumbling.  No family at bedside.  Objective: Vitals:   09/12/20 2323 09/12/20 2330 09/13/20 0319 09/13/20 0434  BP:  125/83 (!) 141/86   Pulse: (!) 105 99 (!) 109   Resp: 18  18   Temp:  98.7 F (37.1 C) 98.7 F (37.1 C)   TempSrc:  Oral Oral   SpO2:   100%   Weight:    90.1 kg  Height:        Intake/Output Summary (Last 24 hours) at 09/13/2020 1037 Last data filed at 09/13/2020 0541 Gross per 24 hour  Intake 1860 ml  Output 1800 ml  Net 60 ml   Filed Weights   09/11/20 0500 09/12/20 0347 09/13/20 0434  Weight: 85.7 kg 85.5 kg 90.1 kg    Examination: General: Frail and debilitated, chronically sick looking lady, on room air.  Laying in bed.  Cushingoid face.  Slightly anxious but mostly quiet and composed. Cardiovascular: S1-S2 normal.  No added sounds. Respiratory: Bilateral clear.  No added sounds. Gastrointestinal: Soft.  Tenderness along the midline appropriate with incisions.  Bowel sounds present.  No stool in the colostomy.  Midline surgical incision with wound VAC in place. Ext: Generalized weakness.  No joint deformity or swelling. Neuro: No focal deficits.  Generalized weakness.   Data Reviewed: I have personally reviewed following labs and imaging studies  CBC: Recent Labs  Lab 09/09/20 0354 09/10/20 0355 09/11/20 0240 09/12/20 0756 09/13/20 0416  WBC 11.5* 11.4* 10.6* 9.9 8.9  NEUTROABS 10.0* 11.2* 9.5* 8.5* 6.8  HGB 9.0* 9.1* 8.7* 8.9* 8.3*  HCT 29.7* 30.1* 28.8* 29.5* 28.0*  MCV 98.0 98.0 98.3 99.7 100.4*  PLT 88* 73* 63* 81* 95*   Basic Metabolic Panel: Recent Labs  Lab 09/08/20 0305 09/09/20 0354 09/10/20 0355 09/11/20 0240 09/12/20 0756  09/13/20 0416  NA 139 135 139 138 142 135  K 3.3* 3.8 3.5 4.5 4.2 4.2  CL 103 101 102 103 107 101  CO2 26 28 33* 30 31 28   GLUCOSE 338* 291* 223* 227* 190* 214*  BUN 9 15 13 13 14 17   CREATININE 0.50 0.47 0.36* 0.39* 0.39* 0.45  CALCIUM 8.9 8.5* 8.3* 8.1* 8.5* 8.5*  MG 1.9 2.1 2.0 2.1  --  2.0  PHOS 3.2 2.9 2.1* 3.1  --  3.2   GFR: Estimated Creatinine Clearance: 80.7 mL/min (by C-G formula based on SCr of 0.45 mg/dL). Liver Function Tests: Recent Labs  Lab 09/07/20 0444 09/10/20 0355 09/13/20 0416  AST 15 20 21   ALT 27 23 23   ALKPHOS 217* 175* 142*  BILITOT 0.8 0.5 0.5  PROT 4.7* 4.3* 4.5*  ALBUMIN 2.2* 2.1* 2.1*   No results for input(s): LIPASE, AMYLASE in the last 168 hours. No results for input(s): AMMONIA in the last 168 hours. Coagulation Profile: No results for input(s): INR, PROTIME in the last 168 hours. Cardiac Enzymes: No results for input(s): CKTOTAL, CKMB, CKMBINDEX, TROPONINI in the last 168 hours. BNP (last 3 results) No results for input(s): PROBNP in the  last 8760 hours. HbA1C: No results for input(s): HGBA1C in the last 72 hours. CBG: Recent Labs  Lab 09/12/20 1958 09/12/20 2043 09/12/20 2321 09/13/20 0318 09/13/20 0734  GLUCAP 286* 304* 297* 241* 185*   Lipid Profile: No results for input(s): CHOL, HDL, LDLCALC, TRIG, CHOLHDL, LDLDIRECT in the last 72 hours. Thyroid Function Tests: No results for input(s): TSH, T4TOTAL, FREET4, T3FREE, THYROIDAB in the last 72 hours. Anemia Panel: No results for input(s): VITAMINB12, FOLATE, FERRITIN, TIBC, IRON, RETICCTPCT in the last 72 hours. Sepsis Labs: No results for input(s): PROCALCITON, LATICACIDVEN in the last 168 hours.  No results found for this or any previous visit (from the past 240 hour(s)).       Radiology Studies: DG Abd Portable 1V  Result Date: 09/11/2020 CLINICAL DATA:  Ileus felling gastrointestinal surgery. EXAM: PORTABLE ABDOMEN - 1 VIEW COMPARISON:  Radiograph yesterday  FINDINGS: The enteric tube is been retracted, the side port is now in the region of the gastroesophageal junction. Recommend advancement of at least 2-3 cm for optimal placement. Decreased gaseous gastric distension from prior exam. Persistent gaseous distension of small bowel in the central abdomen up to 5.1 cm. Small amount of contrast in the ascending colon with small to moderate colonic stool burden. IMPRESSION: 1. Partial retraction of the enteric tube, side-port now in the region of the gastroesophageal junction. Recommend advancement of 2-3 cm for optimal placement. 2. Persistent gaseous small bowel distension suggesting obstruction or ileus. Electronically Signed   By: Keith Rake M.D.   On: 09/11/2020 15:14        Scheduled Meds: . bisacodyl  10 mg Rectal Daily  . Chlorhexidine Gluconate Cloth  6 each Topical Daily  . docusate  100 mg Per Tube BID  . feeding supplement  1 Container Oral TID BM  . gabapentin  100 mg Oral TID  . insulin aspart  0-20 Units Subcutaneous Q4H  . insulin aspart  2 Units Subcutaneous Q4H  . lidocaine  1 patch Transdermal Q24H  . mouth rinse  15 mL Mouth Rinse BID  . melatonin  9 mg Per Tube QHS  . methylPREDNISolone (SOLU-MEDROL) injection  40 mg Intravenous Q12H  . metoprolol tartrate  25 mg Oral BID  . pantoprazole (PROTONIX) IV  40 mg Intravenous Q24H  . polyethylene glycol  17 g Oral Daily   Continuous Infusions: . sodium chloride 20 mL/hr at 09/12/20 0600  . TPN ADULT (ION) 90 mL/hr at 09/13/20 0500     LOS: 15 days    Time spent: 30 minutes    Barb Merino, MD [facility] Pager [phone]

## 2020-09-14 ENCOUNTER — Inpatient Hospital Stay (HOSPITAL_COMMUNITY): Payer: 59

## 2020-09-14 DIAGNOSIS — K625 Hemorrhage of anus and rectum: Secondary | ICD-10-CM | POA: Diagnosis not present

## 2020-09-14 DIAGNOSIS — E861 Hypovolemia: Secondary | ICD-10-CM | POA: Diagnosis not present

## 2020-09-14 DIAGNOSIS — I9589 Other hypotension: Secondary | ICD-10-CM | POA: Diagnosis not present

## 2020-09-14 LAB — GLUCOSE, CAPILLARY
Glucose-Capillary: 133 mg/dL — ABNORMAL HIGH (ref 70–99)
Glucose-Capillary: 134 mg/dL — ABNORMAL HIGH (ref 70–99)
Glucose-Capillary: 138 mg/dL — ABNORMAL HIGH (ref 70–99)
Glucose-Capillary: 174 mg/dL — ABNORMAL HIGH (ref 70–99)
Glucose-Capillary: 199 mg/dL — ABNORMAL HIGH (ref 70–99)
Glucose-Capillary: 199 mg/dL — ABNORMAL HIGH (ref 70–99)
Glucose-Capillary: 217 mg/dL — ABNORMAL HIGH (ref 70–99)

## 2020-09-14 LAB — CBC WITH DIFFERENTIAL/PLATELET
Abs Immature Granulocytes: 1.07 10*3/uL — ABNORMAL HIGH (ref 0.00–0.07)
Basophils Absolute: 0 10*3/uL (ref 0.0–0.1)
Basophils Relative: 0 %
Eosinophils Absolute: 0 10*3/uL (ref 0.0–0.5)
Eosinophils Relative: 0 %
HCT: 27.8 % — ABNORMAL LOW (ref 36.0–46.0)
Hemoglobin: 8.3 g/dL — ABNORMAL LOW (ref 12.0–15.0)
Immature Granulocytes: 14 %
Lymphocytes Relative: 13 %
Lymphs Abs: 1 10*3/uL (ref 0.7–4.0)
MCH: 29.4 pg (ref 26.0–34.0)
MCHC: 29.9 g/dL — ABNORMAL LOW (ref 30.0–36.0)
MCV: 98.6 fL (ref 80.0–100.0)
Monocytes Absolute: 0.4 10*3/uL (ref 0.1–1.0)
Monocytes Relative: 5 %
Neutro Abs: 5.3 10*3/uL (ref 1.7–7.7)
Neutrophils Relative %: 68 %
Platelets: 99 10*3/uL — ABNORMAL LOW (ref 150–400)
RBC: 2.82 MIL/uL — ABNORMAL LOW (ref 3.87–5.11)
RDW: 17.9 % — ABNORMAL HIGH (ref 11.5–15.5)
WBC: 7.8 10*3/uL (ref 4.0–10.5)
nRBC: 9 % — ABNORMAL HIGH (ref 0.0–0.2)

## 2020-09-14 MED ORDER — POLYETHYLENE GLYCOL 3350 17 G PO PACK
17.0000 g | PACK | Freq: Two times a day (BID) | ORAL | Status: DC
  Administered 2020-09-14 – 2020-09-20 (×12): 17 g via ORAL
  Filled 2020-09-14 (×13): qty 1

## 2020-09-14 MED ORDER — TRAVASOL 10 % IV SOLN
INTRAVENOUS | Status: AC
  Filled 2020-09-14: qty 1198.8

## 2020-09-14 MED ORDER — METOCLOPRAMIDE HCL 5 MG/ML IJ SOLN
5.0000 mg | Freq: Three times a day (TID) | INTRAMUSCULAR | Status: AC
  Administered 2020-09-14 – 2020-09-20 (×18): 5 mg via INTRAVENOUS
  Filled 2020-09-14 (×18): qty 2

## 2020-09-14 NOTE — Plan of Care (Signed)
  Problem: Education: Goal: Knowledge of General Education information will improve Description: Including pain rating scale, medication(s)/side effects and non-pharmacologic comfort measures Outcome: Progressing   Problem: Health Behavior/Discharge Planning: Goal: Ability to manage health-related needs will improve Outcome: Progressing   Problem: Clinical Measurements: Goal: Ability to maintain clinical measurements within normal limits will improve Outcome: Progressing Goal: Will remain free from infection Outcome: Progressing Goal: Diagnostic test results will improve Outcome: Progressing Goal: Cardiovascular complication will be avoided Outcome: Progressing   Problem: Activity: Goal: Risk for activity intolerance will decrease Outcome: Progressing   Problem: Nutrition: Goal: Adequate nutrition will be maintained Outcome: Progressing   Problem: Coping: Goal: Level of anxiety will decrease Outcome: Progressing   Problem: Elimination: Goal: Will not experience complications related to urinary retention Outcome: Progressing   Problem: Pain Managment: Goal: General experience of comfort will improve Outcome: Progressing

## 2020-09-14 NOTE — Progress Notes (Signed)
Progress Note  13 Days Post-Op  Subjective: Patient reports rheumatoid pain in chest this AM. Reports abdomen feels more bloated and she did not have much PO intake yesterday. She is intermittently nauseated. Husband at bedside this AM.   Objective: Vital signs in last 24 hours: Temp:  [98.9 F (37.2 C)-99 F (37.2 C)] 99 F (37.2 C) (04/28 1935) Pulse Rate:  [99-108] 100 (04/29 0000) Resp:  [20] 20 (04/29 0000) BP: (139-146)/(83-90) 139/83 (04/29 0000) SpO2:  [99 %-100 %] 99 % (04/29 0000) Weight:  [90.1 kg] 90.1 kg (04/29 0250) Last BM Date: 09/12/20  Intake/Output from previous day: 04/28 0701 - 04/29 0700 In: 2438 [P.O.:600; I.V.:1838] Out: 2000 [Urine:1800; Drains:200] Intake/Output this shift: No intake/output data recorded.  PE: General: pleasant, WD,obesefemale who is laying in bed in NAD Heart:sinus tachy in the low 100s Lungs: CTAB, no wheezes, rhonchi, or rales noted. Respiratory effort nonlabored Abd: soft,appropriately ttp,moredistended,midline wound with some increase in granulation tissue and inferior portion shallower. Stoma with significant separation from 12-6 o'clock, viable, some hard stool present in ostomy appliance        Lab Results:  Recent Labs    09/13/20 0416 09/14/20 0227  WBC 8.9 7.8  HGB 8.3* 8.3*  HCT 28.0* 27.8*  PLT 95* 99*   BMET Recent Labs    09/12/20 0756 09/13/20 0416  NA 142 135  K 4.2 4.2  CL 107 101  CO2 31 28  GLUCOSE 190* 214*  BUN 14 17  CREATININE 0.39* 0.45  CALCIUM 8.5* 8.5*   PT/INR No results for input(s): LABPROT, INR in the last 72 hours. CMP     Component Value Date/Time   NA 135 09/13/2020 0416   NA 129 (L) 08/06/2020 1003   K 4.2 09/13/2020 0416   CL 101 09/13/2020 0416   CO2 28 09/13/2020 0416   GLUCOSE 214 (H) 09/13/2020 0416   BUN 17 09/13/2020 0416   BUN 6 08/06/2020 1003   CREATININE 0.45 09/13/2020 0416   CREATININE 0.85 08/02/2015 1710   CALCIUM 8.5 (L) 09/13/2020  0416   PROT 4.5 (L) 09/13/2020 0416   PROT 4.9 (L) 08/06/2020 1003   ALBUMIN 2.1 (L) 09/13/2020 0416   ALBUMIN 3.2 (L) 08/06/2020 1003   AST 21 09/13/2020 0416   ALT 23 09/13/2020 0416   ALKPHOS 142 (H) 09/13/2020 0416   BILITOT 0.5 09/13/2020 0416   BILITOT 0.4 08/06/2020 1003   GFRNONAA >60 09/13/2020 0416   GFRNONAA 78 08/02/2015 1710   GFRAA >60 01/17/2020 0546   GFRAA >89 08/02/2015 1710   Lipase     Component Value Date/Time   LIPASE 64 (H) 07/23/2020 0432       Studies/Results: No results found.  Anti-infectives: Anti-infectives (From admission, onward)   Start     Dose/Rate Route Frequency Ordered Stop   09/01/20 1930  ciprofloxacin (CIPRO) IVPB 400 mg        400 mg 200 mL/hr over 60 Minutes Intravenous Every 12 hours 09/01/20 1836 09/05/20 1927   09/01/20 1930  metroNIDAZOLE (FLAGYL) IVPB 500 mg        500 mg 100 mL/hr over 60 Minutes Intravenous Every 8 hours 09/01/20 1836 09/05/20 2124       Assessment/Plan Hx of HTN DM2 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  OSA ABL anemia- hgb 8.3, stable - above per [facility]-  Rhonda [name] is a 60 year old [name] with a history of GI bleed s/p angioembolization of a branch of the  IMA on 4/13, who developed a necrotic section of proximal sigmoid colon with perforation and feculent peritonitis - S/pExploratory Laparotomy, Partial Colectomy (sigmoid), colostomy creation,Hartman's procedure on 4/16/2022by Dr. Grandville Silos - POD #13 -Ileus post-op.CT4/24shows possible SBO. More distended this AM. Check KUB - separation of stoma from 12-6 o'clock but remains functioning and above the fascia  -Completed4 days post opabxdue to feculent peritonitis. Afebrile and wbc wnl (on chronic steroids). -Continue PT/OT, mobilize - recommending SNF - [facility] following for ostomy education -Vac changes MWF - Pulm toilet  FEN -TPN,sips and ice chips  VTE -SCDs,okay for chemical  prophylaxis from a general surgery standpoint ID -Cipro/Flagyl4/16- 4/20. None currently Foley - None  LOS: 16 days    Rhonda [name], [facility] Surgery 09/14/2020, 8:19 AM Please see Amion for pager number during day hours 7:00am-4:30pm

## 2020-09-14 NOTE — Progress Notes (Signed)
PROGRESS NOTE    DESTANI WAMSER  CBJ:[id number] DOB: [birth year] DOA: 08/29/2020 PCP: [place].    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   Significant events: 3/8, admission with rectal bleeding, EGD and colonoscopy negative.   4/4-4/6 admitted with hematochezia, symptomatically treated, capsule endoscopy and discharged home. 4/13 presented with hematochezia, CT angiogram with acute bleeding in the descending colon status post coil embolization of the distal [place] branch of the superior sigmoidal artery. Insulin infusion for high blood sugars.  Admitted to ICU. 4/15, transferred out of ICU.  4/16, afternoon after eating lunch, patient had severe abdominal pain.  Relieved with pain medication.  A CT scan showed visceral perforation.  Underwent urgent exploratory laparotomy and found to have necrotic sigmoid colon.  Status post Jeanette Caprice procedure with colostomy and wound VAC for midline incision. 4/22, no return of bowel function yet.  on TPN.  Continues to have ileus, NG tube decompression 4/25. 4/26, some stool noted in the colostomy. 4/27, NG tube removed and on fluid trials.  Patient has persistent ileus and very poor recovery.   Assessment & Plan:   Active Problems:   GI bleed   Hypotension   Rectal bleeding   AKI (acute kidney injury) ([place])  Perforated sigmoid colon with history of diverticulosis and multiple procedures.  IMA embolization 4/13. Emergency exploratory laparotomy and Hartman's procedure 4/16. Prolonged ileus.  Continues ileus, NG tube for decompression 4/25. Followed by surgery.  Increasing mobility as much. Peritoneal cultures negative.   Completed antibiotic therapy. PICC line and TPN.  Fluid management, electrolytes replaced by pharmacy as per TPN protocol. Persistent ileus.  Started on Reglan today.  On clears.  Acute lower GI bleeding, diverticular bleeding.  Hemorrhagic shock secondary to acute GI bleeding and anemia of blood loss: Hemoglobin is stable since surgery.  Acute renal failure: Due to #1.  Improved and normalized.  On maintenance IV fluid along with TPN.  Type 2 diabetes with hyperglycemia: On TPN.  On increasing dose of insulin.  Neurosarcoidosis/chronic debility and wheelchair-bound status:  Patient on prednisone 40 mg twice a day at home.  Given Solu-Medrol perioperative.  Unreliable oral intake and absorption.  Continue Solu-Medrol today and will keep on IV Solu-Medrol until reliable oral intake and bowel function. Occasional delirium, symptomatic treatment with Ativan.  Melatonin at night.    Obstructive sleep apnea: Declined CPAP.  Not using.  Hypertension: On metoprolol.  Heart rate acceptable.  If unable to use by mouth, will use as needed IV metoprolol.  Hypochloremic hyponatremia: Normalizing.   Hypomagnesemia/hypokalemia: Replaced by pharmacy per TPN protocol.  DVT prophylaxis: SCDs Start: 08/29/20 2256   Code Status: Full code Family Communication: Husband at the bedside. Disposition Plan: Status is: Inpatient  Remains inpatient appropriate because:Inpatient level of care appropriate due to severity of illness   Dispo: The patient is from: Home              Anticipated d/c is to: Skilled nursing facility.              Patient currently is not medically stable to d/c.   Difficult to place patient no         Consultants:   PCCM  Gastroenterology  Interventional radiology  Surgery.  Procedures:   Embolization  inferior mesenteric artery 4/13  Hartman's procedure 4/16.  Antimicrobials:  Antibiotics Given (last 72 hours)    None       Subjective: Patient seen  and examined.  Abdomen is more distended.  She has a lot of pain on her right shoulder today.  Does not feel like eating.  Objective: Vitals:   09/13/20 1613 09/13/20 1935 09/14/20 0000 09/14/20 0250  BP: (!) 146/87 (!) 141/90 139/83   Pulse: (!) 108 99 100   Resp: 20  20   Temp: 98.9 F (37.2 C) 99 F (37.2 C)    TempSrc: Oral Oral    SpO2: 100%  99%   Weight:    90.1 kg  Height:        Intake/Output Summary (Last 24 hours) at 09/14/2020 1349 Last data filed at 09/14/2020 1151 Gross per 24 hour  Intake 2197.99 ml  Output 2200 ml  Net -2.01 ml   Filed Weights   09/12/20 0347 09/13/20 0434 09/14/20 0250  Weight: 85.5 kg 90.1 kg 90.1 kg    Examination: General: Frail and debilitated, chronically sick looking lady, on room air.  Laying in bed.  Anxious today.  In moderate distress. Cardiovascular: S1-S2 normal.  No added sounds. Respiratory: Bilateral clear.  No added sounds. Gastrointestinal: Soft.  Tenderness along the midline appropriate with incisions.  Bowel sounds present.  Distended and tympanic.   Colostomy with no stool.  Midline incision with wound VAC on.  Ext: Generalized weakness.  No joint deformity or swelling. Neuro: No focal deficits.  Generalized weakness.   Data Reviewed: I have personally reviewed following labs and imaging studies  CBC: Recent Labs  Lab 09/10/20 0355 09/11/20 0240 09/12/20 0756 09/13/20 0416 09/14/20 0227  WBC 11.4* 10.6* 9.9 8.9 7.8  NEUTROABS 11.2* 9.5* 8.5* 6.8 5.3  HGB 9.1* 8.7* 8.9* 8.3* 8.3*  HCT 30.1* 28.8* 29.5* 28.0* 27.8*  MCV 98.0 98.3 99.7 100.4* 98.6  PLT 73* 63* 81* 95* 99*   Basic Metabolic Panel: Recent Labs  Lab 09/08/20 0305 09/09/20 0354 09/10/20 0355 09/11/20 0240 09/12/20 0756 09/13/20 0416  NA 139 135 139 138 142 135  K 3.3* 3.8 3.5 4.5 4.2 4.2  CL 103 101 102 103 107 101  CO2 26 28 33* 30 31 28   GLUCOSE 338* 291* 223* 227* 190* 214*  BUN 9 15 13 13 14 17   CREATININE 0.50 0.47 0.36* 0.39*  0.39* 0.45  CALCIUM 8.9 8.5* 8.3* 8.1* 8.5* 8.5*  MG 1.9 2.1 2.0 2.1  --  2.0  PHOS 3.2 2.9 2.1* 3.1  --  3.2   GFR: Estimated Creatinine Clearance: 80.7 mL/min (by C-G formula based on SCr of 0.45 mg/dL). Liver Function Tests: Recent Labs  Lab 09/10/20 0355 09/13/20 0416  AST 20 21  ALT 23 23  ALKPHOS 175* 142*  BILITOT 0.5 0.5  PROT 4.3* 4.5*  ALBUMIN 2.1* 2.1*   No results for input(s): LIPASE, AMYLASE in the last 168 hours. No results for input(s): AMMONIA in the last 168 hours. Coagulation Profile: No results for input(s): INR, PROTIME in the last 168 hours. Cardiac Enzymes: No results for input(s): CKTOTAL, CKMB, CKMBINDEX, TROPONINI in the last 168 hours. BNP (last 3 results) No results for input(s): PROBNP in the last 8760 hours. HbA1C: No results for input(s): HGBA1C in the last 72 hours. CBG: Recent Labs  Lab 09/13/20 1931 09/14/20 0036 09/14/20 0256 09/14/20 0822 09/14/20 1245  GLUCAP 216* 199* 217* 174* 199*   Lipid Profile: No results  for input(s): CHOL, HDL, LDLCALC, TRIG, CHOLHDL, LDLDIRECT in the last 72 hours. Thyroid Function Tests: No results for input(s): TSH, T4TOTAL, FREET4, T3FREE, THYROIDAB in the last 72 hours. Anemia Panel: No results for input(s): VITAMINB12, FOLATE, FERRITIN, TIBC, IRON, RETICCTPCT in the last 72 hours. Sepsis Labs: No results for input(s): PROCALCITON, LATICACIDVEN in the last 168 hours.  No results found for this or any previous visit (from the past 240 hour(s)).       Radiology Studies: DG Abd Portable 1V  Result Date: 09/14/2020 CLINICAL DATA:  Ileus EXAM: PORTABLE ABDOMEN - 1 VIEW COMPARISON:  September 11, 2020 FINDINGS: Nasogastric tube no longer appreciable. There is generalized bowel dilatation without appreciable air-fluid levels on supine examination. No free air evident on supine examination. No abnormal calcifications. IMPRESSION: Nasogastric tube no longer evident. Probable ileus, although enteritis or a  degree of bowel obstruction could present in this manner. No free air evident on supine examination. Electronically Signed   By: Lowella Grip III M.D.   On: 09/14/2020 09:18        Scheduled Meds: . Chlorhexidine Gluconate Cloth  6 each Topical Daily  . docusate  100 mg Per Tube BID  . feeding supplement  1 Container Oral TID BM  . gabapentin  100 mg Oral TID  . insulin aspart  0-20 Units Subcutaneous Q4H  . insulin aspart  6 Units Subcutaneous Q4H  . lidocaine  1 patch Transdermal Q24H  . mouth rinse  15 mL Mouth Rinse BID  . melatonin  9 mg Per Tube QHS  . methylPREDNISolone (SOLU-MEDROL) injection  40 mg Intravenous Q12H  . metoCLOPramide (REGLAN) injection  5 mg Intravenous Q8H  . metoprolol tartrate  25 mg Oral BID  . pantoprazole (PROTONIX) IV  40 mg Intravenous Q24H  . polyethylene glycol  17 g Oral BID   Continuous Infusions: . sodium chloride 20 mL/hr at 09/13/20 1400  . TPN ADULT (ION) 90 mL/hr at 09/13/20 1749  . TPN ADULT (ION)       LOS: 16 days    Time spent: 30 minutes    Barb Merino, MD [facility] Pager [phone]

## 2020-09-14 NOTE — Progress Notes (Signed)
PHARMACY - TOTAL PARENTERAL NUTRITION CONSULT NOTE  Indication: Prolonged ileus  Patient Measurements: Height: _0  (160 cm) Weight: 90.1 kg (198 lb 10.2 oz) IBW/kg (Calculated) : 52.4 TPN AdjBW (KG): 59.2 Body mass index is 35.19 kg/m.  Assessment:  [name] who presented with necrotic section of proximal sigmoid colon with perforation and feculent peritonitis now s/p ex lap with partial sigmoid colectomy and colostomy and Hartman's procedure on 09/01/20.  Developed ileus post op and has remained NPO. Pharmacy consulted to manage TPN.  NG tube placed 4/25. Diet advanced to clear liquid 4/27 >> full liquid diet 4/28.   Glucose / Insulin: A1c 11.8%. CBGs improving but remain elevated in the 200s.   Novolog 2 units q4h, 65 units rSSI, 135 units insulin R in TPN  On PTA prednisone 16m BID for neurosarcoidosis >> methylpred 475mq12h  Electrolytes: K 4.2 (goal >/= 4), Phos 3.2, CoCa 9.6, others WNL Renal: SCr <1, BUN WNL Hepatic: AST / ALT / tbili / TG WNL.  Alk phos down to 142.  Prealbumin WNL.   Albumin 2.1 Intake / Output; MIVF: UOP 0.8 ml/kg/hr, MIVF KVO GI Imaging: 4/24 CT: SBO, suspicious for adhesions, diverticulosis GI Surgeries / Procedures: 4/16 ex lap + hartman's procedure  Central access: PICC placed 09/06/20 TPN start date: 09/06/20   Nutritional Goals: per RD rec on 4/26 1900-2100 Kcal, 110-125 g protein Fluid >2L  Current Nutrition:  TPN + NPO    Plan:  Increase TPN to 90 ml/hr to provide 120g AA, 59g lipid, 259g CHO for total of 1954 kcal  Electrolytes in TPN: Na 7039mL, K 51m81m, Ca 4mEq72m Mg 7mEq/61mPhos 15mmol78mCl:Ac 1:1 Add standard MVI and trace elements to TPN Continue resistant SSI Q4H, increase regular insulin in TPN to 135 units, Novolog 6 units Q4H (hold for CBG<120)  -will leave insulin regimen the same today and will f/u tomorrow for adjustments with NPO dietary change today    MadelinWilson SingerD PGY1 Pharmacy Resident 09/14/2020 7:40  AM

## 2020-09-14 NOTE — Progress Notes (Signed)
Occupational Therapy Treatment Patient Details Name: Rhonda [name] MRN: [id number] DOB: 01-15-1961 Today's Date: 09/14/2020    History of present illness 60 yo female presented to [facility] on 08/29/20 with rectal bleeding (several recent admissions for similar). Pt admitted with hemorrhagic shock.  She is s/p IR for coil embolization of intraluminal extravasation for a distal arcade branch of the IMA on 08/29/20.  She developed abdominal pain and found to have SBO with bowel perforation s/p emergent exp lap, partial colectomy, colostomy, Hartman's and wound vac application 11/18/48.  Medical hx inlcudes DM2, seizures, HTN, DDD, recurrent GIB.  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   OT comments  Pt making steady progress towards OT goals this session. Pt continues to present with increased pain, decreased activity tolerance, and impaired balance. Pt requires MAX A +2 to complete x2 sit<>stands from EOB with RW. Pt was able to burp several times as RN reports pt needs to release air from her stomach. Pt HR increased to as much as 140 bpm after second stand with pt requesting to lay back down becoming very anxious, MAX A +2 to return pt to supine with HR decreasing to 117 bpm once pt back in bed. Pt would continue to benefit from skilled occupational therapy while admitted and after d/c to address the below listed limitations in order to improve overall functional mobility and facilitate independence with BADL participation. DC plan remains appropriate, will follow acutely per POC.     Follow Up Recommendations  SNF;Supervision/Assistance - 24 hour    Equipment Recommendations  Hospital bed;Other (comment) (to be continually assessed and updated)    Recommendations for Other Services      Precautions / Restrictions Precautions Precautions: Fall Precaution Comments: Abdomen incision with wound vac Restrictions Weight Bearing Restrictions: No        Mobility Bed Mobility Overal bed mobility: Needs Assistance Bed Mobility: Rolling;Sidelying to Sit;Sit to Sidelying Rolling: Mod assist Sidelying to sit: Mod assist;+2 for safety/equipment     Sit to sidelying: +2 for physical assistance;Max assist General bed mobility comments: pt requires cues to sequence log roll technique, MOD A +2 to advance BLEs off EOB and elevate trunk into sitting, MAX A +2 to return to supine d/t fatigue and moment of anxiety    Transfers Overall transfer level: Needs assistance Equipment used: Rolling walker (2 wheeled) Transfers: Sit to/from Stand Sit to Stand: Max assist;+2 physical assistance         General transfer comment: pt able to sit<>stand x2 from EOB with MAX A +2, heavy use of bed pad to shift hips anteriorly into upright position and elevate trunk into standing. pt with difficulty maneuvering R hand from bed rail to Rw    Balance Overall balance assessment: Needs assistance Sitting-balance support: Bilateral upper extremity supported;Feet supported Sitting balance-[name]: Fair Sitting balance - Comments: close min guard for static sitting balance   Standing balance support: Bilateral upper extremity supported Standing balance-[name]: Poor Standing balance comment: reliant on BUE support and external assist                           ADL either performed or assessed with clinical judgement   ADL Overall ADL's : Needs assistance/impaired                     Lower Body Dressing: Total assistance;+2 for safety/equipment Lower Body Dressing Details (indicate cue  type and reason): to don socks Toilet Transfer: Maximal assistance;+2 for physical assistance;RW Toilet Transfer Details (indicate cue type and reason): sit<>stand only with Rw x2 from EOB         Functional mobility during ADLs: Maximal assistance;+2 for physical assistance;Rolling walker (sit<>stand only) General ADL Comments: pt limited by  increased pain this session, impaired balance and decreased activity tolerance     Vision       Perception     Praxis      Cognition Arousal/Alertness: Awake/alert Behavior During Therapy: WFL for tasks assessed/performed;Flat affect;Anxious Overall Cognitive Status: History of cognitive impairments - at baseline                                 General Comments: pt agreeable to session and relaxed a lot when [employer] was played, became very faitgued and anxious needing to lay back down after x2 stands        Exercises     Shoulder Instructions       General Comments pt really enjoys Scientist, forensic it playing on computer at end of session,pt on RA during session HR increase to 140 bpm with activity SpO2 100% on RA     Pertinent Vitals/ Pain       Pain Assessment: Faces Faces Pain Scale: Hurts little more Pain Location: abdomen Pain Descriptors / Indicators: Grimacing;Guarding;Discomfort Pain Intervention(s): Limited activity within patient's tolerance;Monitored during session;Repositioned;RN gave pain meds during session  Home Living                                          Prior Functioning/Environment              Frequency  Min 2X/week        Progress Toward Goals  OT Goals(current goals can now be found in the care plan section)  Progress towards OT goals: Progressing toward goals  Acute Rehab OT Goals Patient Stated Goal: to do another stand OT Goal Formulation: With patient Time For Goal Achievement: 09/18/20 Potential to Achieve Goals: Good  Plan Discharge plan remains appropriate;Frequency remains appropriate    Co-evaluation                 AM-PAC OT "6 Clicks" Daily Activity     Outcome Measure   Help from another person eating meals?: A Little Help from another person taking care of personal grooming?: A Little Help from another person toileting, which includes using toliet, bedpan,  or urinal?: Total Help from another person bathing (including washing, rinsing, drying)?: Total Help from another person to put on and taking off regular upper body clothing?: A Lot Help from another person to put on and taking off regular lower body clothing?: Total 6 Click Score: 11    End of Session Equipment Utilized During Treatment: Gait belt;Rolling walker  OT Visit Diagnosis: Unsteadiness on feet (R26.81);Other abnormalities of gait and mobility (R26.89);Muscle weakness (generalized) (M62.81);Other symptoms and signs involving cognitive function;Pain Pain - part of body:  (ABD)   Activity Tolerance Patient tolerated treatment well   Patient Left in bed;with call bell/phone within reach;with bed alarm set   Nurse Communication Mobility status        Time: 5726-2035 OT Time Calculation (min): 23 min  Charges: OT General Charges $OT Visit: 1 Visit OT Treatments $  Therapeutic Activity: 23-37 mins  Harley Alto., COTA/L Acute Rehabilitation Services [place] 09/14/2020, 3:09 PM

## 2020-09-14 NOTE — Consult Note (Signed)
[place] Nurse wound follow up Patient receiving care in [facility] [id number]. Spouse at bedside. Wound type: surgical abdominal wound Measurement: deferred. Inferior depth 3.2 cm today Wound bed: pink Drainage (amount, consistency, odor) serosanginous in canister Periwound: intact Dressing procedure/placement/frequency: one piece of black foam removed; one piece of black foam placed into wound. Drape applied, immediate seal obtained.   [place] Nurse ostomy follow up Stoma type/location: LUQ colostomy Stomal assessment/size: retracted. Mucocutaneous separation has been, and continues to be present from 6 to 12. Peristomal assessment: intact Treatment options for stomal/peristomal skin: barrier ring and convex pouch Output: formed dark stool  Ostomy pouching: 1pc. convex Education provided: none  Enrolled patient in [employer] Discharge program: No  Supplies at bedside for [facility] and ostomy next week. Val Riles, RN, MSN, CWOCN, CNS-BC, pager [phone]

## 2020-09-15 DIAGNOSIS — I9589 Other hypotension: Secondary | ICD-10-CM | POA: Diagnosis not present

## 2020-09-15 DIAGNOSIS — K625 Hemorrhage of anus and rectum: Secondary | ICD-10-CM | POA: Diagnosis not present

## 2020-09-15 DIAGNOSIS — E861 Hypovolemia: Secondary | ICD-10-CM | POA: Diagnosis not present

## 2020-09-15 LAB — GLUCOSE, CAPILLARY
Glucose-Capillary: 118 mg/dL — ABNORMAL HIGH (ref 70–99)
Glucose-Capillary: 120 mg/dL — ABNORMAL HIGH (ref 70–99)
Glucose-Capillary: 127 mg/dL — ABNORMAL HIGH (ref 70–99)
Glucose-Capillary: 131 mg/dL — ABNORMAL HIGH (ref 70–99)
Glucose-Capillary: 143 mg/dL — ABNORMAL HIGH (ref 70–99)
Glucose-Capillary: 147 mg/dL — ABNORMAL HIGH (ref 70–99)

## 2020-09-15 LAB — COMPREHENSIVE METABOLIC PANEL
ALT: 29 U/L (ref 0–44)
AST: 25 U/L (ref 15–41)
Albumin: 1.9 g/dL — ABNORMAL LOW (ref 3.5–5.0)
Alkaline Phosphatase: 133 U/L — ABNORMAL HIGH (ref 38–126)
Anion gap: 5 (ref 5–15)
BUN: 20 mg/dL (ref 6–20)
CO2: 28 mmol/L (ref 22–32)
Calcium: 8.1 mg/dL — ABNORMAL LOW (ref 8.9–10.3)
Chloride: 101 mmol/L (ref 98–111)
Creatinine, Ser: 0.39 mg/dL — ABNORMAL LOW (ref 0.44–1.00)
GFR, Estimated: >60 mL/min (ref >60)
Glucose, Bld: 114 mg/dL — ABNORMAL HIGH (ref 70–99)
Potassium: 4.5 mmol/L (ref 3.5–5.1)
Sodium: 134 mmol/L — ABNORMAL LOW (ref 135–145)
Total Bilirubin: 0.5 mg/dL (ref 0.3–1.2)
Total Protein: 4.1 g/dL — ABNORMAL LOW (ref 6.5–8.1)

## 2020-09-15 LAB — CBC WITH DIFFERENTIAL/PLATELET
Abs Immature Granulocytes: 1.01 10*3/uL — ABNORMAL HIGH (ref 0.00–0.07)
Basophils Absolute: 0 10*3/uL (ref 0.0–0.1)
Basophils Relative: 0 %
Eosinophils Absolute: 0 10*3/uL (ref 0.0–0.5)
Eosinophils Relative: 0 %
HCT: 25.2 % — ABNORMAL LOW (ref 36.0–46.0)
Hemoglobin: 7.5 g/dL — ABNORMAL LOW (ref 12.0–15.0)
Immature Granulocytes: 15 %
Lymphocytes Relative: 14 %
Lymphs Abs: 0.9 10*3/uL (ref 0.7–4.0)
MCH: 29.3 pg (ref 26.0–34.0)
MCHC: 29.8 g/dL — ABNORMAL LOW (ref 30.0–36.0)
MCV: 98.4 fL (ref 80.0–100.0)
Monocytes Absolute: 0.3 10*3/uL (ref 0.1–1.0)
Monocytes Relative: 5 %
Neutro Abs: 4.3 10*3/uL (ref 1.7–7.7)
Neutrophils Relative %: 66 %
Platelets: 91 10*3/uL — ABNORMAL LOW (ref 150–400)
RBC: 2.56 MIL/uL — ABNORMAL LOW (ref 3.87–5.11)
RDW: 18 % — ABNORMAL HIGH (ref 11.5–15.5)
WBC: 6.5 10*3/uL (ref 4.0–10.5)
nRBC: 10.3 % — ABNORMAL HIGH (ref 0.0–0.2)

## 2020-09-15 MED ORDER — TRAVASOL 10 % IV SOLN
INTRAVENOUS | Status: AC
  Filled 2020-09-15: qty 1198.8

## 2020-09-15 MED ORDER — DOCUSATE SODIUM 100 MG PO CAPS
100.0000 mg | ORAL_CAPSULE | Freq: Two times a day (BID) | ORAL | Status: DC
  Administered 2020-09-16 – 2020-09-28 (×26): 100 mg via ORAL
  Filled 2020-09-15 (×26): qty 1

## 2020-09-15 MED ORDER — MELATONIN 3 MG PO TABS
9.0000 mg | ORAL_TABLET | Freq: Every day | ORAL | Status: DC
  Administered 2020-09-17 – 2020-09-27 (×12): 9 mg via ORAL
  Filled 2020-09-15 (×12): qty 3

## 2020-09-15 NOTE — Plan of Care (Signed)
  Problem: Education: Goal: Knowledge of General Education information will improve Description: Including pain rating scale, medication(s)/side effects and non-pharmacologic comfort measures Outcome: Not Progressing   Problem: Health Behavior/Discharge Planning: Goal: Ability to manage health-related needs will improve Outcome: Not Progressing   Problem: Clinical Measurements: Goal: Ability to maintain clinical measurements within normal limits will improve Outcome: Not Progressing Goal: Will remain free from infection Outcome: Not Progressing Goal: Diagnostic test results will improve Outcome: Not Progressing Goal: Cardiovascular complication will be avoided Outcome: Not Progressing   Problem: Activity: Goal: Risk for activity intolerance will decrease Outcome: Not Progressing   Problem: Nutrition: Goal: Adequate nutrition will be maintained Outcome: Not Progressing   Problem: Coping: Goal: Level of anxiety will decrease Outcome: Not Progressing   Problem: Elimination: Goal: Will not experience complications related to urinary retention Outcome: Not Progressing   Problem: Pain Managment: Goal: General experience of comfort will improve Outcome: Not Progressing   Problem: Safety: Goal: Ability to remain free from injury will improve Outcome: Not Progressing   Problem: Skin Integrity: Goal: Risk for impaired skin integrity will decrease Outcome: Not Progressing   Problem: Fluid Volume: Goal: Will show no signs and symptoms of excessive bleeding Outcome: Not Progressing

## 2020-09-15 NOTE — Progress Notes (Signed)
PROGRESS NOTE    Rhonda [name]  TOI:[id number] DOB: 1960/07/15 DOA: 08/29/2020 PCP: [facility].    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   Significant events: 3/8, admission with rectal bleeding, EGD and colonoscopy negative.   4/4-4/6 admitted with hematochezia, symptomatically treated, capsule endoscopy and discharged home. 4/13 presented with hematochezia, CT angiogram with acute bleeding in the descending colon status post coil embolization of the distal Arcade branch of the superior sigmoidal artery. Insulin infusion for high blood sugars.  Admitted to ICU. 4/15, transferred out of ICU.  4/16, afternoon after eating lunch, patient had severe abdominal pain.  Relieved with pain medication.  A CT scan showed visceral perforation.  Underwent urgent exploratory laparotomy and found to have necrotic sigmoid colon.  Status post Jeanette Caprice procedure with colostomy and wound VAC for midline incision. 4/22, no return of bowel function yet.  on TPN.  Continues to have ileus, NG tube decompression 4/25. 4/26, some stool noted in the colostomy. 4/27, NG tube removed and on fluid trials.  Patient has persistent ileus and very poor recovery.   Assessment & Plan:   Active Problems:   GI bleed   Hypotension   Rectal bleeding   AKI (acute kidney injury) ([place])  Perforated sigmoid colon with history of diverticulosis and multiple procedures.  IMA embolization 4/13. Emergency exploratory laparotomy and Hartman's procedure 4/16. Prolonged ileus.   Today without NG tube, still no bowel function.  Remains n.p.o. with ice chips. Followed by surgery.  Increasing mobility as  much. Peritoneal cultures negative.  Completed antibiotic therapy. PICC line and TPN.  Fluid management, electrolytes replaced by pharmacy as per TPN protocol. Persistent ileus.  Started on Reglan.  N.p.o. with ice chips.  Acute lower GI bleeding, diverticular bleeding.  Hemorrhagic shock secondary to acute GI bleeding and anemia of blood loss: Hemoglobin is stable since surgery.  Acute renal failure: Due to #1.  Improved and normalized.  On maintenance IV fluid along with TPN.  Type 2 diabetes with hyperglycemia: On TPN.  On increasing dose of insulin.  Neurosarcoidosis/chronic debility and wheelchair-bound status:  Patient on prednisone 40 mg twice a day at home.  Given Solu-Medrol perioperative.  Unreliable oral intake and absorption.  Continue Solu-Medrol today and will keep on IV Solu-Medrol until reliable oral intake and bowel function. Occasional delirium, symptomatic treatment with Ativan.  Melatonin at night.    Obstructive sleep apnea: Declined CPAP.  Not using.  Hypertension: On metoprolol.  Heart rate acceptable.  If unable to use by mouth, will use as needed IV metoprolol.  Hypochloremic hyponatremia: Normalizing.   Hypomagnesemia/hypokalemia: Replaced by pharmacy per TPN protocol.  DVT prophylaxis: SCDs Start: 08/29/20 2256   Code Status: Full code Family Communication: None today. Disposition Plan: Status is: Inpatient  Remains inpatient appropriate because:Inpatient level of care appropriate due to severity of illness   Dispo: The patient is from: Home              Anticipated d/c is to: Skilled nursing facility.              Patient currently is not medically stable to d/c.   Difficult to place patient no         Consultants:   PCCM  Gastroenterology  Interventional radiology  Surgery.  Procedures:   Embolization inferior mesenteric artery 4/13  Hartman's procedure 4/16.  Antimicrobials:  Antibiotics Given (last 72 hours)    None        Subjective: Patient seen and examined.  Today she feels better.  Denies any abdominal pain or distention.  Denies any nausea or vomiting.  She was asking whether I am going to give her liquids.  Objective: Vitals:   09/14/20 1930 09/14/20 2336 09/15/20 0335 09/15/20 0339  BP: 127/72 112/76 106/73   Pulse: 100 98 (!) 107   Resp: 20 18 18    Temp: 98.3 F (36.8 C) 98.3 F (36.8 C) 98.3 F (36.8 C)   TempSrc: Oral Oral Oral   SpO2: 98% 100% 99%   Weight:    91 kg  Height:        Intake/Output Summary (Last 24 hours) at 09/15/2020 1417 Last data filed at 09/15/2020 0542 Gross per 24 hour  Intake 615.36 ml  Output 1650 ml  Net -1034.64 ml   Filed Weights   09/13/20 0434 09/14/20 0250 09/15/20 0339  Weight: 90.1 kg 90.1 kg 91 kg    Examination: General: Frail and debilitated, chronically sick looking lady, on room air.  Laying in bed.  Looks fairly comfortable today. Cardiovascular: S1-S2 normal.  No added sounds. Respiratory: Bilateral clear.  No added sounds. Gastrointestinal: Soft.  Tenderness along the midline appropriate with incisions.  Bowel sounds present.  Distended but soft. Colostomy with no stool, trace of brown liquid..  Midline incision with wound VAC on.  Ext: Generalized weakness.  No joint deformity or swelling. Neuro: No focal deficits.  Generalized weakness.   Data Reviewed: I have personally reviewed following labs and imaging studies  CBC: Recent Labs  Lab 09/11/20 0240 09/12/20 0756 09/13/20 0416 09/14/20 0227 09/15/20 0302  WBC 10.6* 9.9 8.9 7.8 6.5  NEUTROABS 9.5* 8.5* 6.8 5.3 4.3  HGB 8.7* 8.9* 8.3* 8.3* 7.5*  HCT 28.8* 29.5* 28.0* 27.8* 25.2*  MCV 98.3 99.7 100.4* 98.6 98.4  PLT 63* 81* 95* 99* 91*   Basic Metabolic Panel: Recent Labs  Lab 09/09/20 0354 09/10/20 0355 09/11/20 0240 09/12/20 0756 09/13/20 0416 09/15/20 0302  NA 135 139 138 142 135 134*  K 3.8 3.5 4.5 4.2 4.2 4.5  CL 101 102 103 107 101 101  CO2 28 33* 30 31  28 28   GLUCOSE 291* 223* 227* 190* 214* 114*  BUN 15 13 13 14 17 20   CREATININE 0.47 0.36* 0.39* 0.39* 0.45 0.39*  CALCIUM 8.5* 8.3* 8.1* 8.5* 8.5* 8.1*  MG 2.1 2.0 2.1  --  2.0  --   PHOS 2.9 2.1* 3.1  --  3.2  --    GFR: Estimated Creatinine Clearance: 81 mL/min (A) (by C-G formula based on SCr of 0.39 mg/dL (L)). Liver Function Tests: Recent Labs  Lab 09/10/20 0355 09/13/20 0416 09/15/20 0302  AST 20 21 25   ALT 23 23 29   ALKPHOS 175* 142* 133*  BILITOT 0.5 0.5 0.5  PROT 4.3* 4.5* 4.1*  ALBUMIN 2.1* 2.1* 1.9*   No results for input(s): LIPASE, AMYLASE in the last 168 hours. No results for input(s): AMMONIA in the last 168 hours. Coagulation Profile: No results for input(s): INR, PROTIME in the last 168 hours. Cardiac Enzymes: No results for input(s): CKTOTAL, CKMB, CKMBINDEX, TROPONINI in the last 168 hours. BNP (last 3 results) No results for input(s): PROBNP in the last 8760 hours. HbA1C: No results for input(s): HGBA1C in the last 72 hours.  CBG: Recent Labs  Lab 09/14/20 1932 09/14/20 2311 09/15/20 0337 09/15/20 0821 09/15/20 1102  GLUCAP 133* 138* 127* 120* 143*   Lipid Profile: No results for input(s): CHOL, HDL, LDLCALC, TRIG, CHOLHDL, LDLDIRECT in the last 72 hours. Thyroid Function Tests: No results for input(s): TSH, T4TOTAL, FREET4, T3FREE, THYROIDAB in the last 72 hours. Anemia Panel: No results for input(s): VITAMINB12, FOLATE, FERRITIN, TIBC, IRON, RETICCTPCT in the last 72 hours. Sepsis Labs: No results for input(s): PROCALCITON, LATICACIDVEN in the last 168 hours.  No results found for this or any previous visit (from the past 240 hour(s)).       Radiology Studies: DG Abd Portable 1V  Result Date: 09/14/2020 CLINICAL DATA:  Ileus EXAM: PORTABLE ABDOMEN - 1 VIEW COMPARISON:  September 11, 2020 FINDINGS: Nasogastric tube no longer appreciable. There is generalized bowel dilatation without appreciable air-fluid levels on supine examination. No  free air evident on supine examination. No abnormal calcifications. IMPRESSION: Nasogastric tube no longer evident. Probable ileus, although enteritis or a degree of bowel obstruction could present in this manner. No free air evident on supine examination. Electronically Signed   By: Lowella Grip III M.D.   On: 09/14/2020 09:18        Scheduled Meds: . Chlorhexidine Gluconate Cloth  6 each Topical Daily  . docusate  100 mg Per Tube BID  . feeding supplement  1 Container Oral TID BM  . gabapentin  100 mg Oral TID  . insulin aspart  0-20 Units Subcutaneous Q4H  . insulin aspart  6 Units Subcutaneous Q4H  . lidocaine  1 patch Transdermal Q24H  . mouth rinse  15 mL Mouth Rinse BID  . melatonin  9 mg Per Tube QHS  . methylPREDNISolone (SOLU-MEDROL) injection  40 mg Intravenous Q12H  . metoCLOPramide (REGLAN) injection  5 mg Intravenous Q8H  . metoprolol tartrate  25 mg Oral BID  . pantoprazole (PROTONIX) IV  40 mg Intravenous Q24H  . polyethylene glycol  17 g Oral BID   Continuous Infusions: . sodium chloride 20 mL/hr at 09/13/20 1400  . TPN ADULT (ION) 90 mL/hr at 09/14/20 1737  . TPN ADULT (ION)       LOS: 17 days    Time spent: 30 minutes    Barb Merino, MD [facility] Pager [phone]

## 2020-09-15 NOTE — Progress Notes (Signed)
PHARMACY - TOTAL PARENTERAL NUTRITION CONSULT NOTE  Indication: Prolonged ileus  Patient Measurements: Height: _0  (160 cm) Weight: 91 kg (200 lb 9.9 oz) IBW/kg (Calculated) : 52.4 TPN AdjBW (KG): 59.2 Body mass index is 35.54 kg/m.  Assessment:  46 YOF who presented with necrotic section of proximal sigmoid colon with perforation and feculent peritonitis now s/p ex lap with partial sigmoid colectomy and colostomy and Hartman's procedure on 09/01/20.  Developed ileus post op and has remained NPO. Pharmacy consulted to manage TPN.  NG tube placed 4/25.   Glucose / Insulin: A1c 11.8%, on Lantus 18/d PTA - CBGs controlled. Used 28 units rSSI, 135 units insulin R in TPN, Novolog 6 units Q4H On PTA prednisone 41m BID for neurosarcoidosis >> SM 462mBID Electrolytes: Na 134, others WNL Renal: SCr <1, BUN WNL Hepatic: AST / ALT / tbili / TG WNL, alk phos down to 133.  Prealbumin WNL, albumin 1.9 Intake / Output; MIVF: UOP 1.3 ml/kg/hr GI Imaging:  4/24 CT: SBO, suspicious for adhesions, diverticulosis GI Surgeries / Procedures:  4/16 ex-lap with Hartman's procedure   Central access: PICC placed 09/06/20 TPN start date: 09/06/20   Nutritional Goals: per RD rec on 4/26 1900-2100 Kcal, 110-125 g protein, > 2L fluid per day  Current Nutrition:  TPN Diet advanced to clear liquid 4/27 >> full liquid diet 4/28 >> NPO 4/29 Boost TID - none charted given  Plan:  Continue TPN at 90 ml/hr to provide 120g AA, 59g ILE and 259g CHO for total of 1954 kCal, meeting 100% of patient needs Electrolytes in TPN: incr Na 11090mL, decr K slightly to 104m81m, Ca 4mEq21m Mg 7mEq/98mPhos 15mmol18mCl:Ac 1:1 Add standard MVI and trace elements to TPN Continue resistant SSI Q4H and Novolog 6 units Q4H (hold for CBG < 120)  Continue 135 units regular insulin in TPN (max amount for TPN rate of 90 ml/hr) Standard TPN labs on Mon/Thurs  Deshonna Trnka D. Vernetta Dizdarevic, PMina MarbleD, BCPS, BCCCP [id number], 11:05 AM

## 2020-09-15 NOTE — Progress Notes (Signed)
At shift change W[place] RN and I went to assess patient. Upon assessment we found small amount of soft stool protruding from stoma along with liquid stool. Cleansed site and changed pouch. Patient and nurses celebrated and patient called husband with the good news. Will continue to monitor stool production.

## 2020-09-15 NOTE — Progress Notes (Signed)
Progress Note  14 Days Post-Op  Subjective: No nausea this am, denies abd pain  Objective: Vital signs in last 24 hours: Temp:  [98.3 F (36.8 C)] 98.3 F (36.8 C) (04/30 0335) Pulse Rate:  [98-107] 107 (04/30 0335) Resp:  [18-20] 18 (04/30 0335) BP: (106-127)/(72-76) 106/73 (04/30 0335) SpO2:  [98 %-100 %] 99 % (04/30 0335) Weight:  [91 kg] 91 kg (04/30 0339) Last BM Date: 09/14/20  Intake/Output from previous day: 04/29 0701 - 04/30 0700 In: 615.4 [P.O.:15; I.V.:600.4] Out: 2050 [Urine:2050] Intake/Output this shift: No intake/output data recorded.  PE: General: pleasant, WD,obesefemale who is laying in bed in NAD Heart:sinus tachy in the low 100sbilateral 4+ pitting LE edema Lungs: CTAB, no wheezes, rhonchi, or rales noted. Respiratory effort nonlabored Abd: soft,nontender, mildly distended.midline wound vac in place with dark ss output, stoma w necrosis no air and scant succus in bag   Lab Results:  Recent Labs    09/14/20 0227 09/15/20 0302  WBC 7.8 6.5  HGB 8.3* 7.5*  HCT 27.8* 25.2*  PLT 99* 91*   BMET Recent Labs    09/13/20 0416 09/15/20 0302  NA 135 134*  K 4.2 4.5  CL 101 101  CO2 28 28  GLUCOSE 214* 114*  BUN 17 20  CREATININE 0.45 0.39*  CALCIUM 8.5* 8.1*   PT/INR No results for input(s): LABPROT, INR in the last 72 hours. CMP     Component Value Date/Time   NA 134 (L) 09/15/2020 0302   NA 129 (L) 08/06/2020 1003   K 4.5 09/15/2020 0302   CL 101 09/15/2020 0302   CO2 28 09/15/2020 0302   GLUCOSE 114 (H) 09/15/2020 0302   BUN 20 09/15/2020 0302   BUN 6 08/06/2020 1003   CREATININE 0.39 (L) 09/15/2020 0302   CREATININE 0.85 08/02/2015 1710   CALCIUM 8.1 (L) 09/15/2020 0302   PROT 4.1 (L) 09/15/2020 0302   PROT 4.9 (L) 08/06/2020 1003   ALBUMIN 1.9 (L) 09/15/2020 0302   ALBUMIN 3.2 (L) 08/06/2020 1003   AST 25 09/15/2020 0302   ALT 29 09/15/2020 0302   ALKPHOS 133 (H) 09/15/2020 0302   BILITOT 0.5 09/15/2020 0302    BILITOT 0.4 08/06/2020 1003   GFRNONAA >60 09/15/2020 0302   GFRNONAA 78 08/02/2015 1710   GFRAA >60 01/17/2020 0546   GFRAA >89 08/02/2015 1710   Lipase     Component Value Date/Time   LIPASE 64 (H) 07/23/2020 0432       Studies/Results: DG Abd Portable 1V  Result Date: 09/14/2020 CLINICAL DATA:  Ileus EXAM: PORTABLE ABDOMEN - 1 VIEW COMPARISON:  September 11, 2020 FINDINGS: Nasogastric tube no longer appreciable. There is generalized bowel dilatation without appreciable air-fluid levels on supine examination. No free air evident on supine examination. No abnormal calcifications. IMPRESSION: Nasogastric tube no longer evident. Probable ileus, although enteritis or a degree of bowel obstruction could present in this manner. No free air evident on supine examination. Electronically Signed   By: Lowella Grip III M.D.   On: 09/14/2020 09:18    Anti-infectives: Anti-infectives (From admission, onward)   Start     Dose/Rate Route Frequency Ordered Stop   09/01/20 1930  ciprofloxacin (CIPRO) IVPB 400 mg        400 mg 200 mL/hr over 60 Minutes Intravenous Every 12 hours 09/01/20 1836 09/05/20 1927   09/01/20 1930  metroNIDAZOLE (FLAGYL) IVPB 500 mg        500 mg 100 mL/hr over 60 Minutes Intravenous  Every 8 hours 09/01/20 1836 09/05/20 2124       Assessment/Plan Hx of HTN DM2 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  OSA ABL anemia- hgb 8.3, stable - above per [facility]-  Ms. Forton is a 60 year old female with a history of GI bleed s/p angioembolization of a branch of the IMA on 4/13, who developed a necrotic section of proximal sigmoid colon with perforation and feculent peritonitis - S/pExploratory Laparotomy, Partial Colectomy (sigmoid), colostomy creation,Hartman's procedure on 4/16/2022by Dr. Grandville Silos - POD #14 -Ileus post-op.CT4/24shows possible SBO.  - separation of stoma from 12-6 o'clock but remains functioning and above the  fascia  -Completed4 days post opabxdue to feculent peritonitis. Afebrile and wbc wnl (on chronic steroids). -Continue PT/OT, mobilize - recommending SNF - WOC following for ostomy education -Vac changes MWF - Pulm toilet  FEN -TPN,sips and ice chips  VTE -SCDs,okay for chemical prophylaxis from a general surgery standpoint ID -Cipro/Flagyl4/16- 4/20. None currently Foley - None  LOS: 17 days    Clovis Riley, [facility] 09/15/2020, 10:25 AM Please see Amion for pager number during day hours 7:00am-4:30pm

## 2020-09-16 DIAGNOSIS — E861 Hypovolemia: Secondary | ICD-10-CM | POA: Diagnosis not present

## 2020-09-16 DIAGNOSIS — K625 Hemorrhage of anus and rectum: Secondary | ICD-10-CM | POA: Diagnosis not present

## 2020-09-16 DIAGNOSIS — I9589 Other hypotension: Secondary | ICD-10-CM | POA: Diagnosis not present

## 2020-09-16 LAB — CBC WITH DIFFERENTIAL/PLATELET
Abs Immature Granulocytes: 1.25 10*3/uL — ABNORMAL HIGH (ref 0.00–0.07)
Basophils Absolute: 0.1 10*3/uL (ref 0.0–0.1)
Basophils Relative: 1 %
Eosinophils Absolute: 0 10*3/uL (ref 0.0–0.5)
Eosinophils Relative: 0 %
HCT: 25.6 % — ABNORMAL LOW (ref 36.0–46.0)
Hemoglobin: 7.6 g/dL — ABNORMAL LOW (ref 12.0–15.0)
Immature Granulocytes: 19 %
Lymphocytes Relative: 11 %
Lymphs Abs: 0.7 10*3/uL (ref 0.7–4.0)
MCH: 29.6 pg (ref 26.0–34.0)
MCHC: 29.7 g/dL — ABNORMAL LOW (ref 30.0–36.0)
MCV: 99.6 fL (ref 80.0–100.0)
Monocytes Absolute: 0.4 10*3/uL (ref 0.1–1.0)
Monocytes Relative: 6 %
Neutro Abs: 4.3 10*3/uL (ref 1.7–7.7)
Neutrophils Relative %: 63 %
Platelets: 103 10*3/uL — ABNORMAL LOW (ref 150–400)
RBC: 2.57 MIL/uL — ABNORMAL LOW (ref 3.87–5.11)
RDW: 18.2 % — ABNORMAL HIGH (ref 11.5–15.5)
WBC: 6.8 10*3/uL (ref 4.0–10.5)
nRBC: 11.1 % — ABNORMAL HIGH (ref 0.0–0.2)

## 2020-09-16 LAB — GLUCOSE, CAPILLARY
Glucose-Capillary: 129 mg/dL — ABNORMAL HIGH (ref 70–99)
Glucose-Capillary: 140 mg/dL — ABNORMAL HIGH (ref 70–99)
Glucose-Capillary: 143 mg/dL — ABNORMAL HIGH (ref 70–99)
Glucose-Capillary: 168 mg/dL — ABNORMAL HIGH (ref 70–99)
Glucose-Capillary: 175 mg/dL — ABNORMAL HIGH (ref 70–99)
Glucose-Capillary: 179 mg/dL — ABNORMAL HIGH (ref 70–99)

## 2020-09-16 MED ORDER — INSULIN ASPART 100 UNIT/ML ~~LOC~~ SOLN
4.0000 [IU] | SUBCUTANEOUS | Status: DC
  Administered 2020-09-16 – 2020-09-19 (×15): 4 [IU] via SUBCUTANEOUS

## 2020-09-16 MED ORDER — TRAVASOL 10 % IV SOLN
INTRAVENOUS | Status: AC
  Filled 2020-09-16: qty 1198.8

## 2020-09-16 NOTE — Progress Notes (Signed)
PHARMACY - TOTAL PARENTERAL NUTRITION CONSULT NOTE  Indication: Prolonged ileus  Patient Measurements: Height: 5\' 3"  (160 cm) Weight: 91.5 kg (201 lb 11.5 oz) IBW/kg (Calculated) : 52.4 TPN AdjBW (KG): 59.2 Body mass index is 35.73 kg/m.  Assessment:  [name] who presented with necrotic section of proximal sigmoid colon with perforation and feculent peritonitis now s/p ex lap with partial sigmoid colectomy and colostomy and Hartman's procedure on 09/01/20.  Developed ileus post op and has remained NPO. Pharmacy consulted to manage TPN.  NG tube placed 4/25.   Glucose / Insulin: A1c 11.8%, on Lantus 18/d PTA - CBGs < 180. Used 12 units rSSI, 135 units insulin R in TPN, Novolog 6 units Q4H On PTA prednisone 40mg  BID for neurosarcoidosis >> SM 40mg  BID Electrolytes: Na 134, others WNL on 4/30 Renal: SCr <1, BUN WNL Hepatic: AST / ALT / tbili / TG WNL, alk phos down to 133.  Prealbumin WNL, albumin 1.9 Intake / Output; MIVF: UOP 1.3 ml/kg/hr GI Imaging:  4/24 CT: SBO, suspicious for adhesions, diverticulosis GI Surgeries / Procedures:  4/16 ex-lap with Hartman's procedure   Central access: PICC placed 09/06/20 TPN start date: 09/06/20   Nutritional Goals: per RD rec on 4/26 1900-2100 Kcal, 110-125 g protein, > 2L fluid per day  Current Nutrition:  TPN Diet advanced to clear liquid 4/27 >> full liquid diet 4/28 >> NPO 4/29 Boost TID - none charted given  Plan:  Continue TPN at 90 ml/hr to provide 120g AA, 59g ILE and 259g CHO for total of 1954 kCal, meeting 100% of patient needs Electrolytes in TPN: incr Na 17mEq/L on 4/30, decr K slightly to 80mEq/L on 4/30, Ca 65mEq/L, Mg 89mEq/L, Phos 30mmol/L. Cl:Ac 1:1 - no change today Add standard MVI and trace elements to TPN Continue resistant SSI Q4H and reduce Novolog to 4 units Q4H (hold for CBG < 120)  Continue 135 units regular insulin in TPN (max amount for TPN rate of 90 ml/hr) Standard TPN labs on Mon/Thurs  Rhonda [name], PharmD,  BCPS, [place] 09/16/2020, 8:03 AM

## 2020-09-16 NOTE — Progress Notes (Signed)
PROGRESS NOTE    Rhonda [name]  AOZ:[id number] DOB: 1960/07/28 DOA: 08/29/2020 PCP: [place].    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   Significant events: 3/8, admission with rectal bleeding, EGD and colonoscopy negative.   4/4-4/6 admitted with hematochezia, symptomatically treated, capsule endoscopy and discharged home. 4/13 presented with hematochezia, CT angiogram with acute bleeding in the descending colon status post coil embolization of the distal [place] branch of the superior sigmoidal artery. Insulin infusion for high blood sugars.  Admitted to ICU. 4/15, transferred out of ICU.  4/16, afternoon after eating lunch, patient had severe abdominal pain.  Relieved with pain medication.  A CT scan showed visceral perforation.  Underwent urgent exploratory laparotomy and found to have necrotic sigmoid colon.  Status post Jeanette Caprice procedure with colostomy and wound VAC for midline incision. 4/22, no return of bowel function yet.  on TPN.  Continues to have ileus, NG tube decompression 4/25. 4/26, some stool noted in the colostomy. 4/27, NG tube removed and on fluid trials.  Patient has persistent ileus and very poor recovery. 5/1, patient with small amount of bowel movement on her colostomy.  Followed by surgery.   Assessment & Plan:   Active Problems:   GI bleed   Hypotension   Rectal bleeding   AKI (acute kidney injury) ([place])  Perforated sigmoid colon with history of diverticulosis and multiple procedures.  IMA embolization 4/13. Emergency exploratory laparotomy and Hartman's procedure 4/16. Prolonged ileus.  Improving today.  As per surgery patient remains  n.p.o. with ice chips. Followed by surgery.  Increasing mobility as much. Peritoneal cultures negative.  Completed antibiotic therapy. PICC line and TPN.  Fluid management, electrolytes replaced by pharmacy as per TPN protocol. Persistent ileus.  on Reglan.  N.p.o. with ice chips. With improvement of bowel function, can likely start clears, will defer to surgery.  Acute lower GI bleeding, diverticular bleeding.  Hemorrhagic shock secondary to acute GI bleeding and anemia of blood loss: Hemoglobin is stable since surgery.  Acute renal failure: Due to #1.  Improved and normalized.  On maintenance IV fluid along with TPN.  Type 2 diabetes with hyperglycemia: On TPN.  On increasing dose of insulin.  Neurosarcoidosis/chronic debility and wheelchair-bound status:  Patient on prednisone 40 mg twice a day at home.  Given Solu-Medrol perioperative.  Unreliable oral intake and absorption.  Continue Solu-Medrol today and will keep on IV Solu-Medrol until reliable oral intake and bowel function. Occasional delirium, symptomatic treatment with Ativan.  Melatonin at night.    Obstructive sleep apnea: Declined CPAP.  Not using.  Hypertension: On metoprolol.  Heart rate acceptable.  If unable to use by mouth, will use as needed IV metoprolol.  Hypochloremic hyponatremia: Normalizing.   Hypomagnesemia/hypokalemia: Replaced by pharmacy per TPN protocol.  DVT prophylaxis: SCDs Start: 08/29/20 2256   Code Status: Full code Family Communication: None today. Disposition Plan: Status is: Inpatient  Remains inpatient appropriate because:Inpatient level of care appropriate due to severity of illness   Dispo: The patient is from: Home              Anticipated d/c is to: Skilled nursing facility.              Patient currently is not medically stable to d/c.  Difficult to place patient no         Consultants:   PCCM  Gastroenterology  Interventional radiology  Surgery.  Procedures:    Embolization inferior mesenteric artery 4/13  Hartman's procedure 4/16.  Antimicrobials:  Antibiotics Given (last 72 hours)    None       Subjective: Patient seen and examined.  Overnight events noted.  They found some stool on her stoma and patient was very happy. Patient tells me that she does not have any nausea or vomiting.  She does not have any abdominal pain. She tells me that she is missing her chicken broth.  No family at bedside.  Objective: Vitals:   09/15/20 1801 09/15/20 2050 09/16/20 0417 09/16/20 0426  BP: 113/71 119/74  130/79  Pulse: (!) 105 97  99  Resp: 18 18  19   Temp: 98.2 F (36.8 C) 98.8 F (37.1 C)  98.5 F (36.9 C)  TempSrc: Oral Axillary  Axillary  SpO2:  100%  100%  Weight:   91.5 kg   Height:        Intake/Output Summary (Last 24 hours) at 09/16/2020 1144 Last data filed at 09/16/2020 0659 Gross per 24 hour  Intake 1445.1 ml  Output 2115 ml  Net -669.9 ml   Filed Weights   09/14/20 0250 09/15/20 0339 09/16/20 0417  Weight: 90.1 kg 91 kg 91.5 kg    Examination: General: Frail and debilitated, chronically sick looking lady, on room air.  Laying in bed.  Looks fairly comfortable today. Cardiovascular: S1-S2 normal.  No added sounds. Respiratory: Bilateral clear.  No added sounds. Gastrointestinal: Soft.  Tenderness along the midline appropriate with incisions.  Bowel sounds present.  Distended but soft. Colostomy with brown loose stool.  Midline incision with wound VAC on.  Ext: Generalized weakness.  No joint deformity or swelling. Neuro: No focal deficits.  Generalized weakness.   Data Reviewed: I have personally reviewed following labs and imaging studies  CBC: Recent Labs  Lab 09/12/20 0756 09/13/20 0416 09/14/20 0227 09/15/20 0302 09/16/20 0021  WBC 9.9 8.9 7.8 6.5 6.8  NEUTROABS 8.5* 6.8 5.3 4.3 4.3  HGB 8.9* 8.3* 8.3* 7.5* 7.6*  HCT 29.5* 28.0* 27.8* 25.2* 25.6*  MCV 99.7 100.4* 98.6 98.4 99.6  PLT 81* 95* 99* 91*  161*   Basic Metabolic Panel: Recent Labs  Lab 09/10/20 0355 09/11/20 0240 09/12/20 0756 09/13/20 0416 09/15/20 0302  NA 139 138 142 135 134*  K 3.5 4.5 4.2 4.2 4.5  CL 102 103 107 101 101  CO2 33* 30 31 28 28   GLUCOSE 223* 227* 190* 214* 114*  BUN 13 13 14 17 20   CREATININE 0.36* 0.39* 0.39* 0.45 0.39*  CALCIUM 8.3* 8.1* 8.5* 8.5* 8.1*  MG 2.0 2.1  --  2.0  --   PHOS 2.1* 3.1  --  3.2  --    GFR: Estimated Creatinine Clearance: 81.3 mL/min (A) (by C-G formula based on SCr of 0.39 mg/dL (L)). Liver Function Tests: Recent Labs  Lab 09/10/20 0355 09/13/20 0416 09/15/20 0302  AST 20 21 25   ALT 23 23 29   ALKPHOS 175* 142* 133*  BILITOT 0.5 0.5 0.5  PROT 4.3* 4.5* 4.1*  ALBUMIN 2.1* 2.1* 1.9*   No results for input(s): LIPASE, AMYLASE in the last 168 hours. No results for input(s): AMMONIA in the last 168 hours. Coagulation Profile: No results for input(s): INR, PROTIME in the last 168 hours. Cardiac Enzymes: No results for input(s): CKTOTAL, CKMB, CKMBINDEX, TROPONINI  in the last 168 hours. BNP (last 3 results) No results for input(s): PROBNP in the last 8760 hours. HbA1C: No results for input(s): HGBA1C in the last 72 hours. CBG: Recent Labs  Lab 09/15/20 1725 09/15/20 2000 09/15/20 2355 09/16/20 0409 09/16/20 0913  GLUCAP 147* 131* 118* 175* 179*   Lipid Profile: No results for input(s): CHOL, HDL, LDLCALC, TRIG, CHOLHDL, LDLDIRECT in the last 72 hours. Thyroid Function Tests: No results for input(s): TSH, T4TOTAL, FREET4, T3FREE, THYROIDAB in the last 72 hours. Anemia Panel: No results for input(s): VITAMINB12, FOLATE, FERRITIN, TIBC, IRON, RETICCTPCT in the last 72 hours. Sepsis Labs: No results for input(s): PROCALCITON, LATICACIDVEN in the last 168 hours.  No results found for this or any previous visit (from the past 240 hour(s)).       Radiology Studies: No results found.      Scheduled Meds: . Chlorhexidine Gluconate Cloth  6 each  Topical Daily  . docusate sodium  100 mg Oral BID  . feeding supplement  1 Container Oral TID BM  . gabapentin  100 mg Oral TID  . insulin aspart  0-20 Units Subcutaneous Q4H  . insulin aspart  4 Units Subcutaneous Q4H  . lidocaine  1 patch Transdermal Q24H  . mouth rinse  15 mL Mouth Rinse BID  . melatonin  9 mg Oral QHS  . methylPREDNISolone (SOLU-MEDROL) injection  40 mg Intravenous Q12H  . metoCLOPramide (REGLAN) injection  5 mg Intravenous Q8H  . metoprolol tartrate  25 mg Oral BID  . pantoprazole (PROTONIX) IV  40 mg Intravenous Q24H  . polyethylene glycol  17 g Oral BID   Continuous Infusions: . sodium chloride 20 mL/hr at 09/15/20 1815  . TPN ADULT (ION) 90 mL/hr at 09/15/20 1734  . TPN ADULT (ION)       LOS: 18 days    Time spent: 30 minutes    Barb Merino, MD [facility] Pager [phone]

## 2020-09-16 NOTE — Progress Notes (Signed)
15 Days Post-Op   Subjective/Chief Complaint: No new complaints No nausea   Objective: Vital signs in last 24 hours: Temp:  [98.2 F (36.8 C)-98.8 F (37.1 C)] 98.5 F (36.9 C) (05/01 0426) Pulse Rate:  [97-105] 99 (05/01 0426) Resp:  [18-19] 19 (05/01 0426) BP: (113-130)/(71-79) 130/79 (05/01 0426) SpO2:  [100 %] 100 % (05/01 0426) Weight:  [91.5 kg] 91.5 kg (05/01 0417) Last BM Date: 09/14/20  Intake/Output from previous day: 04/30 0701 - 05/01 0700 In: 1445.1 [I.V.:1445.1] Out: 2115 [Urine:2100; Stool:15] Intake/Output this shift: No intake/output data recorded.  General: pleasant, WD,obesefemale who is laying in bed in NAD Heart:sinus tachy in thelow 100sbilateral 4+ pitting LE edema Lungs: CTAB, no wheezes, rhonchi, or rales noted. Respiratory effort nonlabored Abd: soft,nontender, mildly distended.midline wound vac in place with dark ss output, stoma w necrosis no air and scant succus in bag  Lab Results:  Recent Labs    09/15/20 0302 09/16/20 0021  WBC 6.5 6.8  HGB 7.5* 7.6*  HCT 25.2* 25.6*  PLT 91* 103*   BMET Recent Labs    09/15/20 0302  NA 134*  K 4.5  CL 101  CO2 28  GLUCOSE 114*  BUN 20  CREATININE 0.39*  CALCIUM 8.1*   PT/INR No results for input(s): LABPROT, INR in the last 72 hours. ABG No results for input(s): PHART, HCO3 in the last 72 hours.  Invalid input(s): PCO2, PO2  Studies/Results: No results found.  Anti-infectives: Anti-infectives (From admission, onward)   Start     Dose/Rate Route Frequency Ordered Stop   09/01/20 1930  ciprofloxacin (CIPRO) IVPB 400 mg        400 mg 200 mL/hr over 60 Minutes Intravenous Every 12 hours 09/01/20 1836 09/05/20 1927   09/01/20 1930  metroNIDAZOLE (FLAGYL) IVPB 500 mg        500 mg 100 mL/hr over 60 Minutes Intravenous Every 8 hours 09/01/20 1836 09/05/20 2124      Assessment/Plan: Hx of HTN DM2 Neurosarcoidosis/chronic debility and wheelchair-bound- on chronic  steroids.  OSA ABL anemia- hgb 8.3, stable - above per [facility]-  Ms. Birdsell is a 60 year old female with a history of GI bleed s/p angioembolization of a branch of the IMA on 4/13, who developed a necrotic section of proximal sigmoid colon with perforation and feculent peritonitis - S/pExploratory Laparotomy, Partial Colectomy (sigmoid), colostomy creation,Hartman's procedure on 4/16/2022by Dr. Grandville Silos - POD #15 -Ileus post-op.If no bowel function soon, consider repeat CT - separation of stoma from 12-6 o'clock but remains functioning and above the fascia  -Completed4 days post opabxdue to feculent peritonitis. Afebrile and wbc wnl (on chronic steroids). -Continue PT/OT, mobilize - recommending SNF - WOC following for ostomy education -Vac changes MWF - Pulm toilet  FEN -TPN,sips and ice chips  VTE -SCDs,okay for chemical prophylaxis from a general surgery standpoint ID -Cipro/Flagyl4/16- 4/20. None currently Foley - None  LOS: 18 days    Maia Petties 09/16/2020

## 2020-09-17 DIAGNOSIS — K625 Hemorrhage of anus and rectum: Secondary | ICD-10-CM | POA: Diagnosis not present

## 2020-09-17 DIAGNOSIS — I9589 Other hypotension: Secondary | ICD-10-CM | POA: Diagnosis not present

## 2020-09-17 DIAGNOSIS — E861 Hypovolemia: Secondary | ICD-10-CM | POA: Diagnosis not present

## 2020-09-17 LAB — CBC WITH DIFFERENTIAL/PLATELET
Abs Immature Granulocytes: 0.7 10*3/uL — ABNORMAL HIGH (ref 0.00–0.07)
Basophils Absolute: 0 10*3/uL (ref 0.0–0.1)
Basophils Relative: 0 %
Eosinophils Absolute: 0 10*3/uL (ref 0.0–0.5)
Eosinophils Relative: 0 %
HCT: 25.5 % — ABNORMAL LOW (ref 36.0–46.0)
Hemoglobin: 7.5 g/dL — ABNORMAL LOW (ref 12.0–15.0)
Lymphocytes Relative: 5 %
Lymphs Abs: 0.3 10*3/uL — ABNORMAL LOW (ref 0.7–4.0)
MCH: 29.5 pg (ref 26.0–34.0)
MCHC: 29.4 g/dL — ABNORMAL LOW (ref 30.0–36.0)
MCV: 100.4 fL — ABNORMAL HIGH (ref 80.0–100.0)
Metamyelocytes Relative: 5 %
Monocytes Absolute: 0.1 10*3/uL (ref 0.1–1.0)
Monocytes Relative: 2 %
Myelocytes: 5 %
Neutro Abs: 5.6 10*3/uL (ref 1.7–7.7)
Neutrophils Relative %: 83 %
Platelets: 123 10*3/uL — ABNORMAL LOW (ref 150–400)
RBC: 2.54 MIL/uL — ABNORMAL LOW (ref 3.87–5.11)
RDW: 18.5 % — ABNORMAL HIGH (ref 11.5–15.5)
WBC: 6.7 10*3/uL (ref 4.0–10.5)
nRBC: 13.2 % — ABNORMAL HIGH (ref 0.0–0.2)
nRBC: 21 /100 WBC — ABNORMAL HIGH

## 2020-09-17 LAB — COMPREHENSIVE METABOLIC PANEL
ALT: 29 U/L (ref 0–44)
AST: 26 U/L (ref 15–41)
Albumin: 1.9 g/dL — ABNORMAL LOW (ref 3.5–5.0)
Alkaline Phosphatase: 131 U/L — ABNORMAL HIGH (ref 38–126)
Anion gap: 7 (ref 5–15)
BUN: 17 mg/dL (ref 6–20)
CO2: 31 mmol/L (ref 22–32)
Calcium: 8.4 mg/dL — ABNORMAL LOW (ref 8.9–10.3)
Chloride: 102 mmol/L (ref 98–111)
Creatinine, Ser: 0.4 mg/dL — ABNORMAL LOW (ref 0.44–1.00)
GFR, Estimated: >60 mL/min (ref >60)
Glucose, Bld: 104 mg/dL — ABNORMAL HIGH (ref 70–99)
Potassium: 3.9 mmol/L (ref 3.5–5.1)
Sodium: 140 mmol/L (ref 135–145)
Total Bilirubin: 0.3 mg/dL (ref 0.3–1.2)
Total Protein: 4.5 g/dL — ABNORMAL LOW (ref 6.5–8.1)

## 2020-09-17 LAB — GLUCOSE, CAPILLARY
Glucose-Capillary: 103 mg/dL — ABNORMAL HIGH (ref 70–99)
Glucose-Capillary: 102 mg/dL — ABNORMAL HIGH (ref 70–99)
Glucose-Capillary: 182 mg/dL — ABNORMAL HIGH (ref 70–99)
Glucose-Capillary: 173 mg/dL — ABNORMAL HIGH (ref 70–99)
Glucose-Capillary: 144 mg/dL — ABNORMAL HIGH (ref 70–99)

## 2020-09-17 LAB — MAGNESIUM: Magnesium: 2.1 mg/dL (ref 1.7–2.4)

## 2020-09-17 LAB — PHOSPHORUS: Phosphorus: 3.5 mg/dL (ref 2.5–4.6)

## 2020-09-17 LAB — PATHOLOGIST SMEAR REVIEW

## 2020-09-17 LAB — TRIGLYCERIDES: Triglycerides: 85 mg/dL (ref <150)

## 2020-09-17 LAB — PREALBUMIN: Prealbumin: 29.7 mg/dL (ref 18–38)

## 2020-09-17 MED ORDER — NAPHAZOLINE-GLYCERIN 0.012-0.25 % OP SOLN
1.0000 [drp] | Freq: Four times a day (QID) | OPHTHALMIC | Status: DC | PRN
  Filled 2020-09-17: qty 15

## 2020-09-17 MED ORDER — TRAVASOL 10 % IV SOLN
INTRAVENOUS | Status: AC
  Filled 2020-09-17: qty 1198.8

## 2020-09-17 MED ORDER — PREDNISONE 20 MG PO TABS
40.0000 mg | ORAL_TABLET | Freq: Two times a day (BID) | ORAL | Status: DC
  Administered 2020-09-17 – 2020-09-20 (×7): 40 mg via ORAL
  Filled 2020-09-17 (×7): qty 2

## 2020-09-17 NOTE — Consult Note (Signed)
[place] Nurse wound follow up Patient receiving care in [facility] [id number]. PA, Barkley Boards at bedside. Wound type: surgical Measurement: 16 cm x 5 cm x 5.5 cm--depth at most inferior aspect Wound bed: pink Drainage (amount, consistency, odor) dark serosanginous in canister Periwound: intact Dressing procedure/placement/frequency: two pieces of black foam removed. One piece of black foam packed into inferior aspect of the wound. One piece of narrow black foam placed into wound bed. Drape applied, immediate seal obtained.  Supplies in drawer at bedside  [place] Nurse ostomy follow up Stoma type/location: LUQ colostomy Stomal assessment/size: retracted, pink, slightly oval shaped Measures 1 1/2" Oval Mucocutaneous separation from 6:00 to 12:00 Peristomal assessment: intact Treatment options for stomal/peristomal skin: barrier ring and convex pouch Output: formed dark brown stool, bag full at time of change.  Ostomy pouching: 1pc. Flexible convex, [name] [phone]  Education provided: none Enrolled patient in [place] Start Discharge program: No Supplies in drawer at bedside.  [name], MSN, RN, [place], [name], [facility] Wound Treatment Associate Pager [phone]

## 2020-09-17 NOTE — Progress Notes (Signed)
PHARMACY - TOTAL PARENTERAL NUTRITION CONSULT NOTE  Indication: Prolonged ileus  Patient Measurements: Height: 5\' 3"  (160 cm) Weight: 94.8 kg (208 lb 15.9 oz) IBW/kg (Calculated) : 52.4 TPN AdjBW (KG): 59.2 Body mass index is 37.02 kg/m.  Assessment:  [name] who presented with necrotic section of proximal sigmoid colon with perforation and feculent peritonitis now s/p ex lap with partial sigmoid colectomy and colostomy and Hartman's procedure on 09/01/20.  Developed ileus post op and has remained NPO. Pharmacy consulted to manage TPN.  NG tube placed 4/25.   Glucose / Insulin: A1c 11.8%, on Lantus 18/d PTA - CBGs < 180 (several tight values) Used 18 units rSSI, 135 units insulin R in TPN, Novolog 4 units Q4H On PTA prednisone 40mg  BID for neurosarcoidosis >> SM 40mg  BID Electrolytes: K acceptable at 3.9, others WNL Renal: SCr <1, BUN WNL Hepatic: AST / ALT / tbili / TG WNL, alk phos down to 131.  Prealbumin WNL, albumin 1.9 Intake / Output; MIVF: UOP 0.5 ml/kg/hr, stool 367mL GI Imaging:  4/24 CT: SBO, suspicious for adhesions, diverticulosis GI Surgeries / Procedures:  4/16 ex-lap with Hartman's procedure   Central access: PICC placed 09/06/20 TPN start date: 09/06/20   Nutritional Goals: per RD rec on 4/26 1900-2100 Kcal, 110-125 g protein, > 2L fluid per day  Current Nutrition:  TPN Diet advanced to clear liquid 4/27 >> full liquid diet 4/28 >> NPO 4/29 Boost TID - none charted given  Plan:  Continue TPN at 90 ml/hr to provide 120g AA, 59g ILE and 259g CHO for total of 1954 kCal, meeting 100% of patient needs Electrolytes in TPN: Na 163mEq/L, incr K back to 39mEq/L, Ca 42mEq/L, Mg 28mEq/L, Phos 54mmol/L. Cl:Ac 1:1 Add standard MVI and trace elements to TPN Continue resistant SSI Q4H and Novolog 4 units Q4H since has hold parameter Reduce regular insulin in TPN to 110 units Standard TPN labs on Mon/Thurs.  Monitor stool output and CBGs.  Arizbeth Cawthorn D. Mina Marble, PharmD, BCPS,  [place] 09/17/2020, 10:11 AM

## 2020-09-17 NOTE — Progress Notes (Signed)
PROGRESS NOTE    RAFEEF [name]  NIO:[id number] DOB: 06/22/1960 DOA: 08/29/2020 PCP: [place].    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   Significant events: 3/8, admission with rectal bleeding, EGD and colonoscopy negative.   4/4-4/6 admitted with hematochezia, symptomatically treated, capsule endoscopy and discharged home. 4/13 presented with hematochezia, CT angiogram with acute bleeding in the descending colon status post coil embolization of the distal Arcade branch of the superior sigmoidal artery. Insulin infusion for high blood sugars.  Admitted to ICU. 4/15, transferred out of ICU.  4/16, afternoon after eating lunch, patient had severe abdominal pain.  Relieved with pain medication.  A CT scan showed visceral perforation.  Underwent urgent exploratory laparotomy and found to have necrotic sigmoid colon.  Status post Rhonda [name] procedure with colostomy and wound VAC for midline incision. 4/22, no return of bowel function yet.  on TPN.  Continues to have ileus, NG tube decompression 4/25. 4/26, some stool noted in the colostomy. 4/27, NG tube removed and on fluid trials.  Patient has persistent ileus and very poor recovery. 5/2, improving stool output.  Started on clears.  Assessment & Plan:   Active Problems:   GI bleed   Hypotension   Rectal bleeding   AKI (acute kidney injury) ([place])  Perforated sigmoid colon with history of diverticulosis and multiple procedures.  IMA embolization 4/13. Emergency exploratory laparotomy and Hartman's procedure 4/16. Prolonged ileus.  Improving today.  As per surgery patient remains n.p.o. with ice chips. Followed by surgery.   Increasing mobility as much. Peritoneal cultures negative.  Completed antibiotic therapy. PICC line and TPN.  Fluid management, electrolytes replaced by pharmacy as per TPN protocol. With improvement of bowel function, started on clear liquid diet today.  Acute lower GI bleeding, diverticular bleeding.  Hemorrhagic shock secondary to acute GI bleeding and anemia of blood loss: Hemoglobin is stable since surgery.  Acute renal failure: Due to #1.  Improved and normalized.  On maintenance IV fluid along with TPN.  Type 2 diabetes with hyperglycemia: On TPN.  On insulin.  Stable.  Neurosarcoidosis/chronic debility and wheelchair-bound status:  Patient on prednisone 40 mg twice a day at home.  Given Solu-Medrol perioperative.  With persistent ileus, was on Solu-Medrol. Changed to oral prednisone today.  Home dose 40 mg twice a day. Intermittent Ativan.  Also using melatonin at night that she uses at home.  Obstructive sleep apnea: Declined CPAP.  Not using at home also.  Hypertension: On metoprolol.  Heart rate acceptable.    Hypochloremic hyponatremia: Normalizing.   Hypomagnesemia/hypokalemia: Replaced by pharmacy per TPN protocol.  DVT prophylaxis: SCDs Start: 08/29/20 2256   Code Status: Full code Family Communication: None today. Disposition Plan: Status is: Inpatient  Remains inpatient appropriate because:Inpatient level of care appropriate due to severity of illness   Dispo: The patient is from: Home              Anticipated d/c is to: Skilled nursing facility.              Patient currently is not medically stable to d/c.   Difficult to place patient no         Consultants:   PCCM  Gastroenterology  Interventional radiology  Surgery.  Procedures:   Embolization  inferior mesenteric artery 4/13  Hartman's procedure 4/16.  Antimicrobials:  Antibiotics Given (last 72 hours)    None       Subjective: Patient seen and examined in the morning  rounds.  She tells me that she is missing her chicken broth.  She tells me that she had 300 mL of  stool in the colostomy bag and was very happy.  Denies any nausea vomiting.  Denies any abdominal pain or distention.  Objective: Vitals:   09/17/20 0414 09/17/20 0640 09/17/20 0804 09/17/20 1111  BP: 123/78  135/79 129/77  Pulse: (!) 104  (!) 104 100  Resp: 18   18  Temp: 98.9 F (37.2 C)   98.5 F (36.9 C)  TempSrc: Oral   Oral  SpO2: 100%  99% 98%  Weight:  94.8 kg    Height:        Intake/Output Summary (Last 24 hours) at 09/17/2020 1418 Last data filed at 09/17/2020 1013 Gross per 24 hour  Intake --  Output 1750 ml  Net -1750 ml   Filed Weights   09/15/20 0339 09/16/20 0417 09/17/20 0640  Weight: 91 kg 91.5 kg 94.8 kg    Examination: General: Frail and debilitated, chronically sick looking lady, on room air.  Looks fairly comfortable today. Cardiovascular: S1-S2 normal.  No added sounds. Respiratory: Bilateral clear.  No added sounds. Gastrointestinal: Soft.  Tenderness along the midline appropriate with incisions.  Bowel sounds present.  Distended but soft. Colostomy with brown loose stool.  Midline incision with wound VAC on.  Ext: Generalized weakness.  No joint deformity or swelling. Neuro: No focal deficits.  Generalized weakness.   Data Reviewed: I have personally reviewed following labs and imaging studies  CBC: Recent Labs  Lab 09/13/20 0416 09/14/20 0227 09/15/20 0302 09/16/20 0021 09/17/20 0445  WBC 8.9 7.8 6.5 6.8 6.7  NEUTROABS 6.8 5.3 4.3 4.3 5.6  HGB 8.3* 8.3* 7.5* 7.6* 7.5*  HCT 28.0* 27.8* 25.2* 25.6* 25.5*  MCV 100.4* 98.6 98.4 99.6 100.4*  PLT 95* 99* 91* 103* 409*   Basic Metabolic Panel: Recent Labs  Lab 09/11/20 0240 09/12/20 0756 09/13/20 0416 09/15/20 0302 09/17/20 0445  NA 138 142 135 134* 140  K 4.5 4.2 4.2 4.5 3.9  CL 103 107 101 101 102  CO2 30 31 28 28 31   GLUCOSE 227* 190* 214* 114* 104*  BUN 13 14 17 20 17   CREATININE  0.39* 0.39* 0.45 0.39* 0.40*  CALCIUM 8.1* 8.5* 8.5* 8.1* 8.4*  MG 2.1  --  2.0  --  2.1  PHOS 3.1  --  3.2  --  3.5   GFR: Estimated Creatinine Clearance: 83 mL/min (A) (by C-G formula based on SCr of 0.4 mg/dL (L)). Liver Function Tests: Recent Labs  Lab 09/13/20 0416 09/15/20 0302 09/17/20 0445  AST 21 25 26   ALT 23 29 29   ALKPHOS 142* 133* 131*  BILITOT 0.5 0.5 0.3  PROT 4.5* 4.1* 4.5*  ALBUMIN 2.1* 1.9* 1.9*   No results for input(s): LIPASE, AMYLASE in the last 168 hours. No results for input(s): AMMONIA in the last 168 hours. Coagulation Profile: No results for input(s): INR, PROTIME in the last 168 hours. Cardiac Enzymes: No results for input(s): CKTOTAL, CKMB, CKMBINDEX, TROPONINI in the last 168 hours. BNP (last 3 results) No results for input(s): PROBNP in the last 8760 hours. HbA1C: No results for input(s): HGBA1C in the last 72 hours. CBG: Recent Labs  Lab 09/16/20 1943 09/16/20 2350 09/17/20 8119  09/17/20 0831 09/17/20 1113  GLUCAP 143* 129* 103* 102* 182*   Lipid Profile: Recent Labs    09/17/20 0445  TRIG 85   Thyroid Function Tests: No results for input(s): TSH, T4TOTAL, FREET4, T3FREE, THYROIDAB in the last 72 hours. Anemia Panel: No results for input(s): VITAMINB12, FOLATE, FERRITIN, TIBC, IRON, RETICCTPCT in the last 72 hours. Sepsis Labs: No results for input(s): PROCALCITON, LATICACIDVEN in the last 168 hours.  No results found for this or any previous visit (from the past 240 hour(s)).       Radiology Studies: No results found.      Scheduled Meds: . Chlorhexidine Gluconate Cloth  6 each Topical Daily  . docusate sodium  100 mg Oral BID  . feeding supplement  1 Container Oral TID BM  . gabapentin  100 mg Oral TID  . insulin aspart  0-20 Units Subcutaneous Q4H  . insulin aspart  4 Units Subcutaneous Q4H  . lidocaine  1 patch Transdermal Q24H  . mouth rinse  15 mL Mouth Rinse BID  . melatonin  9 mg Oral QHS  .  metoCLOPramide (REGLAN) injection  5 mg Intravenous Q8H  . metoprolol tartrate  25 mg Oral BID  . pantoprazole (PROTONIX) IV  40 mg Intravenous Q24H  . polyethylene glycol  17 g Oral BID  . predniSONE  40 mg Oral BID WC   Continuous Infusions: . sodium chloride 20 mL/hr at 09/15/20 1815  . TPN ADULT (ION) 90 mL/hr at 09/16/20 1720  . TPN ADULT (ION)       LOS: 19 days    Time spent: 30 minutes    Barb Merino, MD [facility] Pager [phone]

## 2020-09-17 NOTE — Progress Notes (Signed)
Progress Note  16 Days Post-Op  Subjective: Patient reports she has been taking in some water but is just on sips and chips so only small volumes in. She denies nausea this AM. She has had a significant increase in stool output from colostomy overnight.   Objective: Vital signs in last 24 hours: Temp:  [98.2 F (36.8 C)-98.9 F (37.2 C)] 98.9 F (37.2 C) (05/02 0414) Pulse Rate:  [100-104] 104 (05/02 0804) Resp:  [18] 18 (05/02 0414) BP: (115-139)/(75-79) 135/79 (05/02 0804) SpO2:  [99 %-100 %] 99 % (05/02 0804) Weight:  [94.8 kg] 94.8 kg (05/02 0640) Last BM Date: 09/17/20  Intake/Output from previous day: 05/01 0701 - 05/02 0700 In: -  Out: 1500 [Urine:1200; Stool:300] Intake/Output this shift: Total I/O In: -  Out: 1000 [Urine:1000]  PE: General: pleasant, WD,obesefemale who is laying in bed in NAD Heart:sinus tachy in thelow 100s Lungs: CTAB, no wheezes, rhonchi, or rales noted. Respiratory effort nonlabored Abd: soft,appropriately ttp,moredistended,midline wound with some increase in granulation tissue and inferior tract 5 cm. Stoma with significant separation from 12-6 o'clock, viable, liquid stool present in ostomy appliance        Lab Results:  Recent Labs    09/16/20 0021 09/17/20 0445  WBC 6.8 6.7  HGB 7.6* 7.5*  HCT 25.6* 25.5*  PLT 103* 123*   BMET Recent Labs    09/15/20 0302 09/17/20 0445  NA 134* 140  K 4.5 3.9  CL 101 102  CO2 28 31  GLUCOSE 114* 104*  BUN 20 17  CREATININE 0.39* 0.40*  CALCIUM 8.1* 8.4*   PT/INR No results for input(s): LABPROT, INR in the last 72 hours. CMP     Component Value Date/Time   NA 140 09/17/2020 0445   NA 129 (L) 08/06/2020 1003   K 3.9 09/17/2020 0445   CL 102 09/17/2020 0445   CO2 31 09/17/2020 0445   GLUCOSE 104 (H) 09/17/2020 0445   BUN 17 09/17/2020 0445   BUN 6 08/06/2020 1003   CREATININE 0.40 (L) 09/17/2020 0445   CREATININE 0.85 08/02/2015 1710   CALCIUM 8.4 (L)  09/17/2020 0445   PROT 4.5 (L) 09/17/2020 0445   PROT 4.9 (L) 08/06/2020 1003   ALBUMIN 1.9 (L) 09/17/2020 0445   ALBUMIN 3.2 (L) 08/06/2020 1003   AST 26 09/17/2020 0445   ALT 29 09/17/2020 0445   ALKPHOS 131 (H) 09/17/2020 0445   BILITOT 0.3 09/17/2020 0445   BILITOT 0.4 08/06/2020 1003   GFRNONAA >60 09/17/2020 0445   GFRNONAA 78 08/02/2015 1710   GFRAA >60 01/17/2020 0546   GFRAA >89 08/02/2015 1710   Lipase     Component Value Date/Time   LIPASE 64 (H) 07/23/2020 0432       Studies/Results: No results found.  Anti-infectives: Anti-infectives (From admission, onward)   Start     Dose/Rate Route Frequency Ordered Stop   09/01/20 1930  ciprofloxacin (CIPRO) IVPB 400 mg        400 mg 200 mL/hr over 60 Minutes Intravenous Every 12 hours 09/01/20 1836 09/05/20 1927   09/01/20 1930  metroNIDAZOLE (FLAGYL) IVPB 500 mg        500 mg 100 mL/hr over 60 Minutes Intravenous Every 8 hours 09/01/20 1836 09/05/20 2124       Assessment/Plan Hx of HTN DM2 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  OSA ABL anemia- hgb 7.5 - above per [facility]-  Ms. Hayton is a 60 year old female with a history of  GI bleed s/p angioembolization of a branch of the IMA on 4/13, who developed a necrotic section of proximal sigmoid colon with perforation and feculent peritonitis - S/pExploratory Laparotomy, Partial Colectomy (sigmoid), colostomy creation,Hartman's procedure on 4/16/2022by Dr. Grandville Silos - POD #16 -Ileus improving - large amt stool out overnight, ok to start CLD today, continue current bowel regimen  - prealbumin 29 - separation of stoma from 12-6 o'clock but remains functioning and above the fascia  -Completed4 days post opabxdue to feculent peritonitis. Afebrile and wbc wnl (on chronic steroids). -Continue PT/OT, mobilize - recommending SNF - WOC following for ostomy education -Vac changes MWF - Pulm toilet  FEN -TPN,CLD VTE  -SCDs,okay for chemical prophylaxis from a general surgery standpoint ID -Cipro/Flagyl4/16- 4/20. None currently Foley - None  LOS: 19 days    Norm Parcel, [facility] 09/17/2020, 9:41 AM Please see Amion for pager number during day hours 7:00am-4:30pm

## 2020-09-17 NOTE — Progress Notes (Signed)
Physical Therapy Treatment Patient Details Name: Rhonda [name] MRN: [id number] DOB: 09-Jan-1961 Today's Date: 09/17/2020    History of Present Illness 60 yo female presented [facility] with rectal bleeding (several recent admissions for similar). Pt admitted with hemorrhagic shock.  She is s/p IR for coil embolization of intraluminal extravasation for a distal arcade branch of the IMA on 08/29/20.  She developed abdominal pain and found to have SBO with bowel perforation s/p emergent exp lap, partial colectomy, colostomy, Hartman's and wound vac application 3/54/65.  Medical hx inlcudes DM2, seizures, HTN, DDD, recurrent GIB.  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    PT Comments    Patient received in bed, pleasant and cooperative, but very anxious with mobility. Continues to require heavy physical assist x2 for all mobility- very weak and deconditioned. Able to stand with RW, but too fatigued to pivot to chair, so performed multiple stands in stedy with rocking and Max encouragement. Extremely fatigued even with using stedy for transfers. HR up to as much as 140BPM with activity, however quickly returned to baseline with seated rest. Left up in recliner with all needs met, husband present and nursing staff aware of patient status. Continue to recommend SNF.    Follow Up Recommendations  SNF     Equipment Recommendations  None recommended by PT    Recommendations for Other Services       Precautions / Restrictions Precautions Precautions: Fall Precaution Comments: Abdomen incision with wound vac Restrictions Weight Bearing Restrictions: No    Mobility  Bed Mobility Overal bed mobility: Needs Assistance Bed Mobility: Rolling;Sidelying to Sit Rolling: Mod assist Sidelying to sit: Mod assist;+2 for safety/equipment       General bed mobility comments: continues to require ModA for log rolling, then ModAx2 to manage trunk and  BLEs to get into sitting, increased time and effort    Transfers Overall transfer level: Needs assistance Equipment used: Rolling walker (2 wheeled);Ambulation equipment used Transfers: Sit to/from Stand Sit to Stand: Max assist;+2 physical assistance         General transfer comment: able to stand with RW with multiple attempts and rocking, but too fatigued to pivot into chair. Switched to stedy, and able to stand 2 more times, but required considerable rest breaks and rocking for each stand  Ambulation/Gait             General Gait Details: unable- fatigue   Stairs             Wheelchair Mobility    Modified Rankin (Stroke Patients Only)       Balance Overall balance assessment: Needs assistance Sitting-balance support: Bilateral upper extremity supported;Feet supported Sitting balance-[name]: Fair Sitting balance - Comments: close min guard for static sitting balance   Standing balance support: Bilateral upper extremity supported Standing balance-[name]: Poor Standing balance comment: reliant on BUE support and external assist                            Cognition Arousal/Alertness: Awake/alert Behavior During Therapy: [facility] for tasks assessed/performed;Flat affect;Anxious Overall Cognitive Status: History of cognitive impairments - at baseline                                 General Comments: agreeable to session, but very anxious with mobility especially as she becomes fatigued  Exercises      General Comments General comments (skin integrity, edema, etc.): HR to as much as 140 with multiple stands but recovered well with rest      Pertinent Vitals/Pain Pain Assessment: Faces Faces Pain Scale: Hurts even more Pain Location: abdomen Pain Descriptors / Indicators: Grimacing;Guarding;Discomfort Pain Intervention(s): Limited activity within patient's tolerance;Monitored during session;Repositioned    Home  Living                      Prior Function            PT Goals (current goals can now be found in the care plan section) Acute Rehab PT Goals Patient Stated Goal: to do another stand PT Goal Formulation: With patient/family Time For Goal Achievement: 10/01/20 Potential to Achieve Goals: Good Progress towards PT goals: Progressing toward goals    Frequency    Min 2X/week      PT Plan Current plan remains appropriate    Co-evaluation              AM-PAC PT "6 Clicks" Mobility   Outcome Measure  Help needed turning from your back to your side while in a flat bed without using bedrails?: A Lot Help needed moving from lying on your back to sitting on the side of a flat bed without using bedrails?: Total Help needed moving to and from a bed to a chair (including a wheelchair)?: Total Help needed standing up from a chair using your arms (e.g., wheelchair or bedside chair)?: Total Help needed to walk in hospital room?: Total Help needed climbing 3-5 steps with a railing? : Total 6 Click Score: 7    End of Session Equipment Utilized During Treatment: Gait belt Activity Tolerance: Patient tolerated treatment well;Patient limited by fatigue Patient left: in chair;with call bell/phone within reach;with chair alarm set Nurse Communication: Mobility status;Need for lift equipment PT Visit Diagnosis: Other abnormalities of gait and mobility (R26.89);Muscle weakness (generalized) (M62.81);Pain Pain - part of body:  (abdomen and back)     Time: 7998-7215 PT Time Calculation (min) (ACUTE ONLY): 27 min  Charges:  $Therapeutic Activity: 23-37 mins                     Windell Norfolk, DPT, PN1   Supplemental Physical Therapist [place]    Pager [phone] Acute Rehab Office [phone]

## 2020-09-18 DIAGNOSIS — I9589 Other hypotension: Secondary | ICD-10-CM | POA: Diagnosis not present

## 2020-09-18 DIAGNOSIS — K625 Hemorrhage of anus and rectum: Secondary | ICD-10-CM | POA: Diagnosis not present

## 2020-09-18 DIAGNOSIS — E861 Hypovolemia: Secondary | ICD-10-CM | POA: Diagnosis not present

## 2020-09-18 LAB — CBC WITH DIFFERENTIAL/PLATELET
Abs Immature Granulocytes: 1.73 10*3/uL — ABNORMAL HIGH (ref 0.00–0.07)
Basophils Absolute: 0 10*3/uL (ref 0.0–0.1)
Basophils Relative: 0 %
Eosinophils Absolute: 0 10*3/uL (ref 0.0–0.5)
Eosinophils Relative: 0 %
HCT: 26.4 % — ABNORMAL LOW (ref 36.0–46.0)
Hemoglobin: 7.7 g/dL — ABNORMAL LOW (ref 12.0–15.0)
Immature Granulocytes: 21 %
Lymphocytes Relative: 14 %
Lymphs Abs: 1.2 10*3/uL (ref 0.7–4.0)
MCH: 29.2 pg (ref 26.0–34.0)
MCHC: 29.2 g/dL — ABNORMAL LOW (ref 30.0–36.0)
MCV: 100 fL (ref 80.0–100.0)
Monocytes Absolute: 0.4 10*3/uL (ref 0.1–1.0)
Monocytes Relative: 4 %
Neutro Abs: 5.2 10*3/uL (ref 1.7–7.7)
Neutrophils Relative %: 61 %
Platelets: 124 10*3/uL — ABNORMAL LOW (ref 150–400)
RBC: 2.64 MIL/uL — ABNORMAL LOW (ref 3.87–5.11)
RDW: 18.6 % — ABNORMAL HIGH (ref 11.5–15.5)
WBC: 8.4 10*3/uL (ref 4.0–10.5)
nRBC: 8.6 % — ABNORMAL HIGH (ref 0.0–0.2)

## 2020-09-18 LAB — GLUCOSE, CAPILLARY
Glucose-Capillary: 109 mg/dL — ABNORMAL HIGH (ref 70–99)
Glucose-Capillary: 111 mg/dL — ABNORMAL HIGH (ref 70–99)
Glucose-Capillary: 129 mg/dL — ABNORMAL HIGH (ref 70–99)
Glucose-Capillary: 154 mg/dL — ABNORMAL HIGH (ref 70–99)
Glucose-Capillary: 166 mg/dL — ABNORMAL HIGH (ref 70–99)
Glucose-Capillary: 167 mg/dL — ABNORMAL HIGH (ref 70–99)
Glucose-Capillary: 264 mg/dL — ABNORMAL HIGH (ref 70–99)

## 2020-09-18 MED ORDER — ENSURE ENLIVE PO LIQD: 237.0000 mL | Freq: Three times a day (TID) | ORAL | Status: DC

## 2020-09-18 MED ORDER — FAT EMUL FISH OIL/PLANT BASED 20% (SMOFLIPID)IV EMUL
INTRAVENOUS | Status: AC
  Filled 2020-09-18: qty 1198.8

## 2020-09-18 NOTE — Progress Notes (Signed)
Nutrition Follow-up  DOCUMENTATION CODES:   Not applicable  INTERVENTION:    TPN dosing per Pharmacy, reducing to half rate tomorrow.  Change supplement to Ensure Enlive po TID to provide additional kcal and protein, each supplement provides 350 kcal and 20 grams of protein.  NUTRITION DIAGNOSIS:   Inadequate oral intake related to altered GI function as evidenced by other (comment) (NPO/Clear liquid diet).  Ongoing  GOAL:   Patient will meet greater than or equal to 90% of their needs   Met with TPN  MONITOR:   PO intake,Supplement acceptance,Diet advancement,Labs,Skin,I & O's  REASON FOR ASSESSMENT:   NPO/Clear Liquid Diet,Consult New TPN/TNA  ASSESSMENT:   60 yo female admitted with frank rectal bleeding and hypotension. 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   4/13 - S/P coil embolization of the distal Arcade branch of the superior sigmoidal artery. 4/16 - Developed severe abdominal pain. CT showed visceral perforation. S/P emergent ex lap, found to have necrotic sigmoid colon. 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   Patient states she is very sleepy. She has been drinking the Boost breeze supplements 2-3 times per day. Discussed switching to Ensure supplements to provide more calories and protein than Boost breeze. She is glad to be advancing diet to full liquids today.  VAC in place to abdominal incision: 150 ml output x 24 hours.  Colostomy output: 400 ml x 24 hours.   TPN decreasing to half rate 5/4. Currently infusing at 90 ml/h to provide 2160 ml, 1954 kcal, 120 gm protein per day to meet 100% of estimated kcal and protein needs.   Labs reviewed.  CBG: [phone]  Medications reviewed and include Colace, Novolog SSI, Novolog 4 units q 4 hours, Reglan, Protonix, Miralax, prednisone.  IVF: NS at 20 ml/h  Lowest weight  since admission 79.5 kg on 4/14. Currently 94.8 kg (increased with swelling/edema). Mild pitting edema to BLE present.   Diet Order:   Diet Order            Diet full liquid Room service appropriate? Yes; Fluid consistency: Thin  Diet effective now                 EDUCATION NEEDS:   Not appropriate for education at this time  Skin:  Skin Assessment: Skin Integrity Issues: Skin Integrity Issues:: Other (Comment),Incisions,Wound VAC Wound Vac: abdominal incision Incisions: R chest; abdomen Other: skin tear R thigh  Last BM:  5/3 (ostomy)  Height:   Ht Readings from Last 1 Encounters:  08/30/20 $RemoveB'5\' 3"'[id number]$  (1.6 m)    Weight:   Wt Readings from Last 1 Encounters:  09/17/20 94.8 kg    Ideal Body Weight:  52.3 kg  BMI:  Body mass index is 37.02 kg/m.  Estimated Nutritional Needs:   Kcal:  1900-2100  Protein:  110-125 gm  Fluid:  >/= 2 L    Lucas Mallow, RD, LDN, CNSC Please refer to Amion for contact information.

## 2020-09-18 NOTE — Progress Notes (Signed)
Occupational Therapy Treatment Patient Details Name: Rhonda [name] MRN: [id number] DOB: [birth year] Today's Date: 09/18/2020    History of present illness 60 yo female presented to [facility] on 08/29/20 with rectal bleeding (several recent admissions for similar). Pt admitted with hemorrhagic shock.  She is s/p IR for coil embolization of intraluminal extravasation for a distal arcade branch of the IMA on 08/29/20.  She developed abdominal pain and found to have SBO with bowel perforation s/p emergent exp lap, partial colectomy, colostomy, Hartman's and wound vac application 08/01/15.  Medical hx inlcudes DM2, seizures, HTN, DDD, recurrent GIB.  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   OT comments  Pt with gradual progress towards OT goals, able to self initiate more for OOB attempts. Pt overall Mod A x 2 for bed mobility and sit to stand in [place] for transfer to chair. Pt endorses nausea and dizziness intermittently during session but vitals overall WFL. Pt continues to require extensive assist for LB ADLs due to impaired standing balance and tolerance. Continue to recommend SNF at DC.    Follow Up Recommendations  SNF;Supervision/Assistance - 24 hour    Equipment Recommendations  Hospital bed    Recommendations for Other Services      Precautions / Restrictions Precautions Precautions: Fall Precaution Comments: Abdomen incision with wound vac Restrictions Weight Bearing Restrictions: No       Mobility Bed Mobility Overal bed mobility: Needs Assistance Bed Mobility: Supine to Sit     Supine to sit: Mod assist;+2 for physical assistance;HOB elevated     General bed mobility comments: With gravity minimized, pt able to assist in advancing LEs to EOB. Continued assist needed for trunk    Transfers Overall transfer level: Needs assistance Equipment used: Ambulation equipment used Transfers: Sit to/from Stand Sit to Stand: Mod assist;+2  physical assistance;+2 safety/equipment;From elevated surface         General transfer comment: Pt able to achieve sit to stand in [place] with Mod A x 2 with first try and transferred to recliner    Balance Overall balance assessment: Needs assistance Sitting-balance support: Bilateral upper extremity supported;Feet supported Sitting balance-[name]: Fair     Standing balance support: Bilateral upper extremity supported Standing balance-[name]: Poor Standing balance comment: reliant on BUE support and external assist                           ADL either performed or assessed with clinical judgement   ADL Overall ADL's : Needs assistance/impaired                     Lower Body Dressing: Total assistance;Sitting/lateral leans Lower Body Dressing Details (indicate cue type and reason): to don socks/shoes EOB, unable to safely bend to feet due to abdominal wound vac               General ADL Comments: contines with pain reports though back > abdomen today. Improved participation and functional abilities though still requires extensive assist     Vision   Vision Assessment?: No apparent visual deficits   Perception     Praxis      Cognition Arousal/Alertness: Awake/alert Behavior During Therapy: WFL for tasks assessed/performed;Flat affect Overall Cognitive Status: History of cognitive impairments - at baseline  General Comments: improving participation, self limiting at times. increased time and cueing for sequencing        Exercises     Shoulder Instructions       General Comments HR appeared stable today. pt did endorse nausea and dizziness. Initial BP 140s/120s sitting EOB but after transfer to chair, BP 140s/70s, unsure of accuracy of initial BP reading. Encouraged use of IS while up in chair, able to pull 1280mL    Pertinent Vitals/ Pain       Pain Assessment: Faces Faces Pain  Scale: Hurts little more Pain Location: back Pain Descriptors / Indicators: Grimacing;Guarding;Discomfort Pain Intervention(s): Monitored during session;Limited activity within patient's tolerance;Repositioned  Home Living                                          Prior Functioning/Environment              Frequency  Min 2X/week        Progress Toward Goals  OT Goals(current goals can now be found in the care plan section)  Progress towards OT goals: Progressing toward goals  Acute Rehab OT Goals Patient Stated Goal: be able to improve OT Goal Formulation: With patient Time For Goal Achievement: 10/02/20 Potential to Achieve Goals: Good ADL Goals Pt Will Perform Grooming: with set-up;sitting Pt Will Perform Upper Body Dressing: with set-up;sitting Pt Will Perform Lower Body Dressing: with mod assist;sitting/lateral leans;sit to/from stand Pt Will Transfer to Toilet: with mod assist;stand pivot transfer;bedside commode Additional ADL Goal #1: Pt to complete bed mobility at Min A in prep for ADL transfers  Plan Discharge plan remains appropriate;Frequency remains appropriate    Co-evaluation                 AM-PAC OT "6 Clicks" Daily Activity     Outcome Measure   Help from another person eating meals?: A Little Help from another person taking care of personal grooming?: A Little Help from another person toileting, which includes using toliet, bedpan, or urinal?: Total Help from another person bathing (including washing, rinsing, drying)?: Total Help from another person to put on and taking off regular upper body clothing?: A Little Help from another person to put on and taking off regular lower body clothing?: Total 6 Click Score: 12    End of Session Equipment Utilized During Treatment: Gait belt;Other (comment) Charlaine Dalton)  OT Visit Diagnosis: Unsteadiness on feet (R26.81);Other abnormalities of gait and mobility (R26.89);Muscle weakness  (generalized) (M62.81);Other symptoms and signs involving cognitive function;Pain   Activity Tolerance Patient tolerated treatment well   Patient Left in chair;with call bell/phone within reach;with chair alarm set   Nurse Communication Mobility status        Time: 1856-3149 OT Time Calculation (min): 31 min  Charges: OT General Charges $OT Visit: 1 Visit OT Treatments $Self Care/Home Management : 8-22 mins $Therapeutic Activity: 8-22 mins  Malachy Chamber, OTR/L Acute Rehab Services Office: [phone]   Layla Maw 09/18/2020, 12:30 PM

## 2020-09-18 NOTE — Progress Notes (Signed)
Progress Note  17 Days Post-Op  Subjective: Patient denies much abdominal pain and tolerating CLD. Having stool output   Objective: Vital signs in last 24 hours: Temp:  [98.4 F (36.9 C)-98.6 F (37 C)] 98.4 F (36.9 C) (05/03 0957) Pulse Rate:  [100-116] 116 (05/03 0957) Resp:  [18] 18 (05/03 0957) BP: (117-140)/(69-82) 140/76 (05/03 0957) SpO2:  [98 %-99 %] 98 % (05/03 0957) Last BM Date: 09/17/20  Intake/Output from previous day: 05/02 0701 - 05/03 0700 In: 120 [P.O.:120] Out: 4750 [Urine:4200; Drains:150; Stool:400] Intake/Output this shift: Total I/O In: 360 [P.O.:360] Out: 1525 [Urine:1525]  PE: General: pleasant, WD,obesefemale who is laying in bed in NAD Heart:sinus tachy in thelow 100s Lungs: CTAB, no wheezes, rhonchi, or rales noted. Respiratory effort nonlabored Abd: soft,appropriately ttp,not distended,VAC to midline, stoma with soft brown stool in appliance    Lab Results:  Recent Labs    09/17/20 0445 09/18/20 0332  WBC 6.7 8.4  HGB 7.5* 7.7*  HCT 25.5* 26.4*  PLT 123* 124*   BMET Recent Labs    09/17/20 0445  NA 140  K 3.9  CL 102  CO2 31  GLUCOSE 104*  BUN 17  CREATININE 0.40*  CALCIUM 8.4*   PT/INR No results for input(s): LABPROT, INR in the last 72 hours. CMP     Component Value Date/Time   NA 140 09/17/2020 0445   NA 129 (L) 08/06/2020 1003   K 3.9 09/17/2020 0445   CL 102 09/17/2020 0445   CO2 31 09/17/2020 0445   GLUCOSE 104 (H) 09/17/2020 0445   BUN 17 09/17/2020 0445   BUN 6 08/06/2020 1003   CREATININE 0.40 (L) 09/17/2020 0445   CREATININE 0.85 08/02/2015 1710   CALCIUM 8.4 (L) 09/17/2020 0445   PROT 4.5 (L) 09/17/2020 0445   PROT 4.9 (L) 08/06/2020 1003   ALBUMIN 1.9 (L) 09/17/2020 0445   ALBUMIN 3.2 (L) 08/06/2020 1003   AST 26 09/17/2020 0445   ALT 29 09/17/2020 0445   ALKPHOS 131 (H) 09/17/2020 0445   BILITOT 0.3 09/17/2020 0445   BILITOT 0.4 08/06/2020 1003   GFRNONAA >60 09/17/2020 0445    GFRNONAA 78 08/02/2015 1710   GFRAA >60 01/17/2020 0546   GFRAA >89 08/02/2015 1710   Lipase     Component Value Date/Time   LIPASE 64 (H) 07/23/2020 0432       Studies/Results: No results found.  Anti-infectives: Anti-infectives (From admission, onward)   Start     Dose/Rate Route Frequency Ordered Stop   09/01/20 1930  ciprofloxacin (CIPRO) IVPB 400 mg        400 mg 200 mL/hr over 60 Minutes Intravenous Every 12 hours 09/01/20 1836 09/05/20 1927   09/01/20 1930  metroNIDAZOLE (FLAGYL) IVPB 500 mg        500 mg 100 mL/hr over 60 Minutes Intravenous Every 8 hours 09/01/20 1836 09/05/20 2124       Assessment/Plan Hx of HTN DM2 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  OSA ABL anemia- hgb 7.7, stable  - above per [facility]-  Ms. Reifsteck is a 60 year old female with a history of GI bleed s/p angioembolization of a branch of the IMA on 4/13, who developed a necrotic section of proximal sigmoid colon with perforation and feculent peritonitis - S/pExploratory Laparotomy, Partial Colectomy (sigmoid), colostomy creation,Hartman's procedure on 4/16/2022by Dr. Grandville Silos - POD #17 -Ileus improving - advance to FLD today and continue bowel regimen  - prealbumin 29, 1/2 TPN - separation of  stoma from 12-6 o'clock but remains functioning and above the fascia  -Completed4 days post opabxdue to feculent peritonitis. Afebrile and wbc wnl (on chronic steroids). -Continue PT/OT, mobilize - recommending SNF - WOC following for ostomy education -Vac changes MWF - Pulm toilet  FEN - 1/2TPN,FLD VTE -SCDs,okay for chemical prophylaxis from a general surgery standpoint ID -Cipro/Flagyl4/16- 4/20. None currently Foley - None  LOS: 20 days    Norm Parcel, [facility] Surgery 09/18/2020, 10:48 AM Please see Amion for pager number during day hours 7:00am-4:30pm

## 2020-09-18 NOTE — Progress Notes (Addendum)
PHARMACY - TOTAL PARENTERAL NUTRITION CONSULT NOTE  Indication: Prolonged ileus  Patient Measurements: Height: 5\' 3"  (160 cm) Weight: 94.8 kg (208 lb 15.9 oz) IBW/kg (Calculated) : 52.4 TPN AdjBW (KG): 59.2 Body mass index is 37.02 kg/m.  Assessment:  79 YOF who presented with necrotic section of proximal sigmoid colon with perforation and feculent peritonitis now s/p ex lap with partial sigmoid colectomy and colostomy and Hartman's procedure on 09/01/20.  Developed ileus post op and has remained NPO. Pharmacy consulted to manage TPN.  NG tube placed 4/25.   Glucose / Insulin: A1c 11.8%, on Lantus 18/d PTA - CBGs acceptable Used 14 units rSSI, 110 units insulin R in TPN, Novolog 4 units Q4H (received 4 doses) SM 40 BID switched back to PTA prednisone 40mg  BID for neurosarcoidosis on 5/2 Electrolytes: 5/2 labs - K acceptable at 3.9, others WNL Renal: SCr <1, BUN WNL Hepatic: AST / ALT / tbili / TG WNL, alk phos down to 131.  Prealbumin WNL, albumin 1.9 Intake / Output; MIVF: UOP 1.8 ml/kg/hr, drain 11mL, stool 445mL GI Imaging:  4/24 CT: SBO, suspicious for adhesions, diverticulosis GI Surgeries / Procedures:  4/16 ex-lap with Hartman's procedure   Central access: PICC placed 09/06/20 TPN start date: 09/06/20   Nutritional Goals: per RD rec on 4/26 1900-2100 kCal, 110-125 g protein, > 2L fluid per day  Current Nutrition:  TPN CLD 4/27 >> FLD 4/28 >> NPO 4/29 >> CLD 5/2 Boost TID - 2 charted given yesterday  Plan:  Continue TPN at 90 ml/hr to provide 120g AA, 59g ILE and 259g CHO for total of 1954 kCal, meeting 100% of patient needs Electrolytes in TPN: Na 151mEq/L, incr K back to 72mEq/L on 5/2, Ca 20mEq/L, Mg 70mEq/L, Phos 4mmol/L. Cl:Ac 1:1 Add standard MVI and trace elements to TPN Continue resistant SSI Q4H and Novolog 4 units Q4H since has hold parameter Reduce regular insulin in TPN to 100 units (was at 135 units/bag) Check labs given increased stool output.  Monitor  CBGs.  Hykeem Ojeda D. Mina Marble, PharmD, BCPS, [place] 09/18/2020, 10:09 AM  ==============================  Addendum: Diet advanced to full liquid, 1/2 TPN on 5/4 since TPN already made for today   Miachel Nardelli D. Mina Marble, PharmD, BCPS, [place] 09/18/2020, 11:20 AM

## 2020-09-18 NOTE — Progress Notes (Signed)
PROGRESS NOTE    Rhonda [name]  KGM:[id number] DOB: 1960/08/13 DOA: 08/29/2020 PCP: [facility].    Brief Narrative: 60 year old female with history of hypertension, obesity, obstructive sleep apnea not using CPAP, neurosarcoidosis on chronic prednisone therapy, wheelchair-bound due to chronic debility, recurrent GI bleeding with recent multiple hospitalizations presented back on 4/13 to the emergency room with frank rectal bleeding and hypotension.   Significant events: 3/8, admission with rectal bleeding, EGD and colonoscopy negative.   4/4-4/6 admitted with hematochezia, symptomatically treated, capsule endoscopy and discharged home. 4/13 presented with hematochezia, CT angiogram with acute bleeding in the descending colon status post coil embolization of the distal [place] branch of the superior sigmoidal artery. Insulin infusion for high blood sugars.  Admitted to ICU. 4/15, transferred out of ICU. 4/16, afternoon after eating lunch, patient had severe abdominal pain.  Relieved with pain medication.  A CT scan showed visceral perforation.  Underwent urgent exploratory laparotomy and found to have necrotic sigmoid colon.  Status post Jeanette Caprice procedure with colostomy and wound VAC for midline incision. 4/22-5/2, prolonged ileus and not eating.  On TPN. 5/2-5/3, some return of bowel function.  Started noticing a stool in colostomy.  Tolerating liquids.  Assessment & Plan:   Active Problems:   GI bleed   Hypotension   Rectal bleeding   AKI (acute kidney injury) ([place])  Perforated sigmoid colon with history of diverticulosis and multiple procedures.  IMA embolization 4/13. Emergency exploratory laparotomy and Hartman's procedure 4/16. Prolonged ileus.  Improving today.  Adequate bowel function now.  Colostomy with liquidy stool.  Advancing to full liquid diet today.  Continue TPN.  Completed antibiotic therapy for peritoneal contamination.  Cultures were negative.   Peritoneal cultures negative.  Completed antibiotic therapy. PICC line and TPN.  Fluid management, electrolytes replaced by pharmacy as per TPN protocol.  Acute lower GI bleeding, diverticular bleeding.  Hemorrhagic shock secondary to acute GI bleeding and anemia of blood loss: Hemoglobin is stable since surgery.  Acute renal failure: Due to #1.  Improved and normalized.  On maintenance IV fluid along with TPN.  Type 2 diabetes with hyperglycemia: On TPN.  On high-dose insulin.  Neurosarcoidosis/chronic debility and wheelchair-bound status:  Patient on prednisone 40 mg twice a day at home.  Given Solu-Medrol perioperative.  With persistent ileus, was on Solu-Medrol. Started back on oral prednisone. Intermittent Ativan.  Also using melatonin at night that she uses at home.  Obstructive sleep apnea: Declined CPAP.  Not using at home also.  Hypertension: On metoprolol.  Heart rate acceptable.    Hypochloremic hyponatremia: Normalized.  Hypomagnesemia/hypokalemia: Replaced by pharmacy per TPN protocol.  Patient is medically stabilizing.  Likely once he can eat regular diet with improvement of nutritional status and no further complications she can go to skilled nursing facility in the next 48 to 72 hours.  Anticipate tapering off TPN next few days.  DVT prophylaxis: SCDs Start: 08/29/20 2256   Code Status: Full code Family Communication: None today.  Usually husband at the bedside. Disposition Plan: Status is: Inpatient  Remains inpatient appropriate because:Inpatient level of care appropriate due to severity of illness   Dispo: The patient is from: Home              Anticipated d/c is to: Skilled nursing facility.              Patient currently is not medically stable to d/c.   Difficult to place patient no  Consultants:   PCCM  Gastroenterology  Interventional radiology  Surgery.  Procedures:   Embolization inferior mesenteric artery 4/13  Hartman's  procedure 4/16.  Antimicrobials:  Antibiotics Given (last 72 hours)    None       Subjective: Patient seen and examined.  No overnight events.  Denies any distention, nausea or vomiting.  Afebrile.  She is looking forward to eat more food.  Happy to have stool in her colostomy bag. Objective: Vitals:   09/17/20 1111 09/18/20 0536 09/18/20 0739 09/18/20 0957  BP:  125/69 127/82 140/76  Pulse:  (!) 116 (!) 106 (!) 116  Resp:  18 18 18   Temp:  98.6 F (37 C) 98.4 F (36.9 C) 98.4 F (36.9 C)  TempSrc:  Axillary Axillary Axillary  SpO2: 98%  99% 98%  Weight:      Height:        Intake/Output Summary (Last 24 hours) at 09/18/2020 1140 Last data filed at 09/18/2020 1033 Gross per 24 hour  Intake 360 ml  Output 4625 ml  Net -4265 ml   Filed Weights   09/15/20 0339 09/16/20 0417 09/17/20 0640  Weight: 91 kg 91.5 kg 94.8 kg    Examination: General: Frail and debilitated, chronically sick looking lady, on room air.  Looks fairly comfortable today. Cardiovascular: S1-S2 normal.  No added sounds. Respiratory: Bilateral clear.  No added sounds. Gastrointestinal: Soft nontender today.  Colostomy with brown loose stool.  Midline incision with wound VAC on.   Ext: Generalized weakness.  No joint deformity or swelling. Neuro: No focal deficits.  Generalized weakness.   Data Reviewed: I have personally reviewed following labs and imaging studies  CBC: Recent Labs  Lab 09/14/20 0227 09/15/20 0302 09/16/20 0021 09/17/20 0445 09/18/20 0332  WBC 7.8 6.5 6.8 6.7 8.4  NEUTROABS 5.3 4.3 4.3 5.6 5.2  HGB 8.3* 7.5* 7.6* 7.5* 7.7*  HCT 27.8* 25.2* 25.6* 25.5* 26.4*  MCV 98.6 98.4 99.6 100.4* 100.0  PLT 99* 91* 103* 123* 767*   Basic Metabolic Panel: Recent Labs  Lab 09/12/20 0756 09/13/20 0416 09/15/20 0302 09/17/20 0445  NA 142 135 134* 140  K 4.2 4.2 4.5 3.9  CL 107 101 101 102  CO2 31 28 28 31   GLUCOSE 190* 214* 114* 104*  BUN 14 17 20 17   CREATININE 0.39* 0.45 0.39*  0.40*  CALCIUM 8.5* 8.5* 8.1* 8.4*  MG  --  2.0  --  2.1  PHOS  --  3.2  --  3.5   GFR: Estimated Creatinine Clearance: 83 mL/min (A) (by C-G formula based on SCr of 0.4 mg/dL (L)). Liver Function Tests: Recent Labs  Lab 09/13/20 0416 09/15/20 0302 09/17/20 0445  AST 21 25 26   ALT 23 29 29   ALKPHOS 142* 133* 131*  BILITOT 0.5 0.5 0.3  PROT 4.5* 4.1* 4.5*  ALBUMIN 2.1* 1.9* 1.9*   No results for input(s): LIPASE, AMYLASE in the last 168 hours. No results for input(s): AMMONIA in the last 168 hours. Coagulation Profile: No results for input(s): INR, PROTIME in the last 168 hours. Cardiac Enzymes: No results for input(s): CKTOTAL, CKMB, CKMBINDEX, TROPONINI in the last 168 hours. BNP (last 3 results) No results for input(s): PROBNP in the last 8760 hours. HbA1C: No results for input(s): HGBA1C in the last 72 hours. CBG: Recent Labs  Lab 09/17/20 1707 09/17/20 2053 09/18/20 0118 09/18/20 0450 09/18/20 0743  GLUCAP 173* 144* 111* 129* 109*   Lipid Profile: Recent Labs    09/17/20  0445  TRIG 85   Thyroid Function Tests: No results for input(s): TSH, T4TOTAL, FREET4, T3FREE, THYROIDAB in the last 72 hours. Anemia Panel: No results for input(s): VITAMINB12, FOLATE, FERRITIN, TIBC, IRON, RETICCTPCT in the last 72 hours. Sepsis Labs: No results for input(s): PROCALCITON, LATICACIDVEN in the last 168 hours.  No results found for this or any previous visit (from the past 240 hour(s)).       Radiology Studies: No results found.      Scheduled Meds: . Chlorhexidine Gluconate Cloth  6 each Topical Daily  . docusate sodium  100 mg Oral BID  . feeding supplement  1 Container Oral TID BM  . gabapentin  100 mg Oral TID  . insulin aspart  0-20 Units Subcutaneous Q4H  . insulin aspart  4 Units Subcutaneous Q4H  . lidocaine  1 patch Transdermal Q24H  . mouth rinse  15 mL Mouth Rinse BID  . melatonin  9 mg Oral QHS  . metoCLOPramide (REGLAN) injection  5 mg  Intravenous Q8H  . metoprolol tartrate  25 mg Oral BID  . pantoprazole (PROTONIX) IV  40 mg Intravenous Q24H  . polyethylene glycol  17 g Oral BID  . predniSONE  40 mg Oral BID WC   Continuous Infusions: . sodium chloride 20 mL/hr at 09/15/20 1815  . TPN ADULT (ION) 90 mL/hr at 09/17/20 1814  . TPN ADULT (ION)       LOS: 20 days    Time spent: 30 minutes    Barb Merino, MD [facility] Pager [phone]

## 2020-09-19 DIAGNOSIS — K625 Hemorrhage of anus and rectum: Secondary | ICD-10-CM | POA: Diagnosis not present

## 2020-09-19 LAB — BASIC METABOLIC PANEL
Anion gap: 5 (ref 5–15)
BUN: 19 mg/dL (ref 6–20)
CO2: 32 mmol/L (ref 22–32)
Calcium: 8.5 mg/dL — ABNORMAL LOW (ref 8.9–10.3)
Chloride: 103 mmol/L (ref 98–111)
Creatinine, Ser: 0.44 mg/dL (ref 0.44–1.00)
GFR, Estimated: >60 mL/min (ref >60)
Glucose, Bld: 214 mg/dL — ABNORMAL HIGH (ref 70–99)
Potassium: 4.3 mmol/L (ref 3.5–5.1)
Sodium: 140 mmol/L (ref 135–145)

## 2020-09-19 LAB — CBC WITH DIFFERENTIAL/PLATELET
Abs Immature Granulocytes: 0.7 10*3/uL — ABNORMAL HIGH (ref 0.00–0.07)
Basophils Absolute: 0 10*3/uL (ref 0.0–0.1)
Basophils Relative: 0 %
Eosinophils Absolute: 0 10*3/uL (ref 0.0–0.5)
Eosinophils Relative: 0 %
HCT: 25.8 % — ABNORMAL LOW (ref 36.0–46.0)
Hemoglobin: 7.6 g/dL — ABNORMAL LOW (ref 12.0–15.0)
Lymphocytes Relative: 7 %
Lymphs Abs: 0.5 10*3/uL — ABNORMAL LOW (ref 0.7–4.0)
MCH: 29.2 pg (ref 26.0–34.0)
MCHC: 29.5 g/dL — ABNORMAL LOW (ref 30.0–36.0)
MCV: 99.2 fL (ref 80.0–100.0)
Metamyelocytes Relative: 2 %
Monocytes Absolute: 0.6 10*3/uL (ref 0.1–1.0)
Monocytes Relative: 8 %
Myelocytes: 4 %
Neutro Abs: 5.9 10*3/uL (ref 1.7–7.7)
Neutrophils Relative %: 76 %
Platelets: 147 10*3/uL — ABNORMAL LOW (ref 150–400)
Promyelocytes Relative: 3 %
RBC: 2.6 MIL/uL — ABNORMAL LOW (ref 3.87–5.11)
RDW: 18.6 % — ABNORMAL HIGH (ref 11.5–15.5)
WBC: 7.7 10*3/uL (ref 4.0–10.5)
nRBC: 12 /100 WBC — ABNORMAL HIGH
nRBC: 6.4 % — ABNORMAL HIGH (ref 0.0–0.2)

## 2020-09-19 LAB — GLUCOSE, CAPILLARY
Glucose-Capillary: 226 mg/dL — ABNORMAL HIGH (ref 70–99)
Glucose-Capillary: 184 mg/dL — ABNORMAL HIGH (ref 70–99)
Glucose-Capillary: 180 mg/dL — ABNORMAL HIGH (ref 70–99)
Glucose-Capillary: 171 mg/dL — ABNORMAL HIGH (ref 70–99)
Glucose-Capillary: 197 mg/dL — ABNORMAL HIGH (ref 70–99)
Glucose-Capillary: 215 mg/dL — ABNORMAL HIGH (ref 70–99)

## 2020-09-19 LAB — MAGNESIUM: Magnesium: 2.1 mg/dL (ref 1.7–2.4)

## 2020-09-19 MED ORDER — INSULIN ASPART 100 UNIT/ML IJ SOLN
0.0000 [IU] | Freq: Every day | INTRAMUSCULAR | Status: DC
  Administered 2020-09-19: 2 [IU] via SUBCUTANEOUS

## 2020-09-19 MED ORDER — INSULIN GLARGINE 100 UNIT/ML ~~LOC~~ SOLN
15.0000 [IU] | Freq: Every day | SUBCUTANEOUS | Status: DC
  Administered 2020-09-19 – 2020-09-20 (×2): 15 [IU] via SUBCUTANEOUS
  Filled 2020-09-19 (×2): qty 0.15

## 2020-09-19 MED ORDER — PANTOPRAZOLE SODIUM 40 MG PO TBEC
40.0000 mg | DELAYED_RELEASE_TABLET | Freq: Every day | ORAL | Status: DC
  Administered 2020-09-19 – 2020-09-20 (×2): 40 mg via ORAL
  Filled 2020-09-19 (×3): qty 1

## 2020-09-19 MED ORDER — INSULIN GLARGINE 100 UNIT/ML ~~LOC~~ SOLN
18.0000 [IU] | Freq: Every day | SUBCUTANEOUS | Status: DC
  Filled 2020-09-19: qty 0.18

## 2020-09-19 MED ORDER — METOPROLOL TARTRATE 50 MG PO TABS
50.0000 mg | ORAL_TABLET | Freq: Two times a day (BID) | ORAL | Status: DC
  Administered 2020-09-19 – 2020-09-20 (×3): 50 mg via ORAL
  Filled 2020-09-19 (×3): qty 1

## 2020-09-19 MED ORDER — OXYCODONE HCL 5 MG PO TABS
5.0000 mg | ORAL_TABLET | ORAL | Status: DC | PRN
  Administered 2020-09-19 – 2020-09-20 (×3): 5 mg via ORAL
  Filled 2020-09-19 (×3): qty 1

## 2020-09-19 MED ORDER — GLUCERNA SHAKE PO LIQD
237.0000 mL | Freq: Three times a day (TID) | ORAL | Status: DC
  Administered 2020-09-19 – 2020-09-20 (×3): 237 mL via ORAL

## 2020-09-19 MED ORDER — NYSTATIN 100000 UNIT/ML MT SUSP
5.0000 mL | Freq: Four times a day (QID) | OROMUCOSAL | Status: DC
  Administered 2020-09-19 – 2020-09-28 (×36): 500000 [IU] via ORAL
  Filled 2020-09-19 (×36): qty 5

## 2020-09-19 MED ORDER — INSULIN ASPART 100 UNIT/ML IJ SOLN
0.0000 [IU] | Freq: Three times a day (TID) | INTRAMUSCULAR | Status: DC
  Administered 2020-09-19 – 2020-09-20 (×4): 4 [IU] via SUBCUTANEOUS
  Administered 2020-09-20: 3 [IU] via SUBCUTANEOUS

## 2020-09-19 NOTE — Progress Notes (Addendum)
PHARMACY - TOTAL PARENTERAL NUTRITION CONSULT NOTE  Indication: Prolonged ileus  Patient Measurements: Height: 5\' 3"  (160 cm) Weight: 91.7 kg (202 lb 2.6 oz) IBW/kg (Calculated) : 52.4 TPN AdjBW (KG): 59.2 Body mass index is 35.81 kg/m.  Assessment:  45 YOF who presented with necrotic section of proximal sigmoid colon with perforation and feculent peritonitis, now s/p ex lap with partial sigmoid colectomy and colostomy and Hartman's procedure on 09/01/20. Developed ileus post op and has remained NPO. Pharmacy consulted to manage TPN. NG tube placed 4/25.   Glucose / Insulin: A1c 11.8%, on Lantus 18/d PTA - CBGs 166-264. Utilized 22 units rSSI + 110 units regular insulin in TPN + Novolog 4 units Q4H SM 40 BID switched back to PTA prednisone 40mg  BID for neurosarcoidosis on 5/2 Electrolytes: all stable WNL Renal: SCr <1 stable, BUN WNL Hepatic: AST / ALT / Tbili / TG WNL, alk phos down to 131.  Prealbumin WNL, albumin 1.9 Intake / Output; MIVF: UOP 1.6 ml/kg/hr, LBM 5/3 GI Imaging: 4/24 CT: SBO, suspicious for adhesions, diverticulosis GI Surgeries / Procedures: 4/16 ex-lap with Hartman's procedure   Central access: PICC placed 09/06/20 TPN start date: 09/06/20   Nutritional Goals: (per RD recommendation on 5/3) 1900-2100 kCal, 110-125 g protein, > 2L fluid per day  Current Nutrition:  TPN CLD 4/27 >> FLD 4/28 >> NPO 4/29 >> CLD 5/2 >> FLD 5/3 Boost TID - 1 charted given yesterday; now d/c'd and changed to Ensure [place]  Plan:  D/c TPN today per Surgery Discussed plan with RN. At 1600 - wean TPN to 1/2 rate (45 ml/hr) x 2 hrs, then stop.  Add back PTA Lantus 18 units QHS per discussion with Dr. Tyrell Antonio. Change SSI to qACHS. D/c Novolog 4 units Q4h for now and monitor CBG trend off TPN Change daily PPI to PO D/c TPN labs and RN orders   Arturo Morton, PharmD, BCPS Please check AMION for all [place] contact numbers Clinical Pharmacist 09/19/2020 8:28 AM

## 2020-09-19 NOTE — Progress Notes (Signed)
Progress Note  18 Days Post-Op  Subjective: Patient reports she did well with FLD and would like some fruit today. Having colostomy output. Having some abdominal pain at times.   Objective: Vital signs in last 24 hours: Temp:  [97.8 F (36.6 C)-98.4 F (36.9 C)] 97.8 F (36.6 C) (05/04 0442) Pulse Rate:  [96-116] 98 (05/04 0442) Resp:  [16-18] 16 (05/04 0442) BP: (123-147)/(76-84) 147/78 (05/04 0442) SpO2:  [97 %-100 %] 97 % (05/04 0442) Weight:  [91.7 kg] 91.7 kg (05/04 0442) Last BM Date: 09/18/20  Intake/Output from previous day: 05/03 0701 - 05/04 0700 In: 480 [P.O.:480] Out: 3625 [Urine:3475; Stool:150] Intake/Output this shift: No intake/output data recorded.  PE: General: pleasant, WD,obesefemale who is laying in bed in NAD Heart:sinus tachy in thelow 100s Lungs: CTAB, no wheezes, rhonchi, or rales noted. Respiratory effort nonlabored Abd: soft,appropriately ttp,moredistended,midline woundwith granulation tissue and some fibrinous exudate and inferior tract 4 cm. Stoma with significant separation from 12-3 o'clock, viable and well above fascial level, liquid stool and gas present in ostomy appliance        Lab Results:  Recent Labs    09/18/20 0332 09/19/20 0401  WBC 8.4 7.7  HGB 7.7* 7.6*  HCT 26.4* 25.8*  PLT 124* 147*   BMET Recent Labs    09/17/20 0445 09/19/20 0401  NA 140 140  K 3.9 4.3  CL 102 103  CO2 31 32  GLUCOSE 104* 214*  BUN 17 19  CREATININE 0.40* 0.44  CALCIUM 8.4* 8.5*   PT/INR No results for input(s): LABPROT, INR in the last 72 hours. CMP     Component Value Date/Time   NA 140 09/19/2020 0401   NA 129 (L) 08/06/2020 1003   K 4.3 09/19/2020 0401   CL 103 09/19/2020 0401   CO2 32 09/19/2020 0401   GLUCOSE 214 (H) 09/19/2020 0401   BUN 19 09/19/2020 0401   BUN 6 08/06/2020 1003   CREATININE 0.44 09/19/2020 0401   CREATININE 0.85 08/02/2015 1710   CALCIUM 8.5 (L) 09/19/2020 0401   PROT 4.5 (L)  09/17/2020 0445   PROT 4.9 (L) 08/06/2020 1003   ALBUMIN 1.9 (L) 09/17/2020 0445   ALBUMIN 3.2 (L) 08/06/2020 1003   AST 26 09/17/2020 0445   ALT 29 09/17/2020 0445   ALKPHOS 131 (H) 09/17/2020 0445   BILITOT 0.3 09/17/2020 0445   BILITOT 0.4 08/06/2020 1003   GFRNONAA >60 09/19/2020 0401   GFRNONAA 78 08/02/2015 1710   GFRAA >60 01/17/2020 0546   GFRAA >89 08/02/2015 1710   Lipase     Component Value Date/Time   LIPASE 64 (H) 07/23/2020 0432       Studies/Results: No results found.  Anti-infectives: Anti-infectives (From admission, onward)   Start     Dose/Rate Route Frequency Ordered Stop   09/01/20 1930  ciprofloxacin (CIPRO) IVPB 400 mg        400 mg 200 mL/hr over 60 Minutes Intravenous Every 12 hours 09/01/20 1836 09/05/20 1927   09/01/20 1930  metroNIDAZOLE (FLAGYL) IVPB 500 mg        500 mg 100 mL/hr over 60 Minutes Intravenous Every 8 hours 09/01/20 1836 09/05/20 2124       Assessment/Plan Hx of HTN DM2 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  OSA ABL anemia- hgb7.6, stable  - above per [facility]-  Ms. Nevin is a 60 year old female with a history of GI bleed s/p angioembolization of a branch of the IMA on 4/13, who  developed a necrotic section of proximal sigmoid colon with perforation and feculent peritonitis S/pExploratory Laparotomy, Partial Colectomy (sigmoid), colostomy creation,Hartman's procedure on 4/16/2022by Dr. Grandville Silos - POD #18 -Ileusimproving - advance to carb mod/HH diet and continue bowel regimen  - prealbumin 29, ok to stop TPN today  - separation of stoma from 12-6 o'clock but remains functioning and above the fascia  -Completed4 days post opabxdue to feculent peritonitis. Afebrile and wbc wnl (on chronic steroids). -Continue PT/OT, mobilize - recommending SNF - WOC following for ostomy education -Vac changes MWF - Pulm toilet  FEN - HH/CM diet  VTE -SCDs,okay for chemical  prophylaxis from a general surgery standpoint ID -Cipro/Flagyl4/16- 4/20. None currently Foley - None  LOS: 21 days    Norm Parcel, [facility] 09/19/2020, 8:37 AM Please see Amion for pager number during day hours 7:00am-4:30pm

## 2020-09-19 NOTE — Consult Note (Signed)
[place] Nurse wound follow up Patient receiving care in [place]. PA, Barkley Boards at bedside.  Wound type:surgical Measurement:Depth of wound 4.2 cm  Wound PQD:IYME Drainage (amount, consistency, odor) purulent serosanginous in canister Periwound:intact Dressing procedure/placement/frequency:two pieces of black foam removed. One piece of black foam packed into inferior aspect of the wound. One piece of narrow black foam placed into wound bed. Drape applied, immediate seal obtained.  Supplies in drawer at bedside  [place] ostomy follow up Dr. Bobbye Morton present to assess stoma and mucocutaneous separation.  Stoma type/location:LUQ colostomy Stomal assessment/size:retracted, pink, oval shaped Measures 1 5/8" Oval Mucocutaneous separation from 6:00 to 12:00 Peristomal assessment:intact Treatment options for stomal/peristomal skin:barrier ring and convex pouch Output: formed dark brown stool, gas in pouch Ostomy pouching: 1pc.Flexible convex, [name] [phone] Education provided:none Enrolled patient in [facility] Start Discharge program: No Supplies in drawer at bedside.  [name], MSN, RN, [place], [name], [facility] Wound Treatment Associate Pager [phone]

## 2020-09-19 NOTE — TOC Progression Note (Addendum)
Transition of Care [facility]) - Progression Note    Patient Details  Name: EMERSEN MASCARI MRN: [id number] Date of Birth: [birth year]  Transition of Care [facility]) CM/SW [place], LCSW Phone Number: 09/19/2020, 5:15 PM  Clinical Narrative:    CSW requested [employer] start insurance authorization process and order wound vac in the event patient will be medically stable by the end of the week. Also requested PT work with patient for insurance updates.    Expected Discharge Plan: [place] Barriers to Discharge: Insurance Authorization,Continued Medical Work up  Expected Discharge Plan and Services Expected Discharge Plan: [place] In-house Referral: Clinical Social Work   Post Acute Care Choice: [place] Living arrangements for the past 2 months: Single Family Home                                       Social Determinants of Health (SDOH) Interventions    Readmission Risk Interventions Readmission Risk Prevention Plan 09/12/2020 09/04/2020  Transportation Screening Complete Complete  Medication Review [employer]) - Referral to Pharmacy  PCP or Specialist appointment within 3-5 days of discharge Complete Complete  HRI or Home Care Consult Complete Complete  SW Recovery Care/Counseling Consult Complete Complete  Palliative Care Screening Not Applicable Not Applicable  Skilled Nursing Facility Complete Complete  Some recent data might be hidden

## 2020-09-19 NOTE — Progress Notes (Signed)
PROGRESS NOTE    Rhonda [name]  JKD:[id number] DOB: 12-30-1960 DOA: 08/29/2020 PCP: [place].   Brief Narrative: 60 year old with past medical history significant for hypertension, obesity, obstructive sleep apnea not using CPAP, neurosarcoidosis on chronic prednisone, wheelchair-bound due to chronic debility, recurrent GI bleeding with recent multiple hospitalization presented back on 4/13 to the emergency room with frank rectal bleeding and hypotension.  Significant events:   4/4-4/6 Admitted with hematochezia, symptomatically treated, capsule endoscopy and discharged home. 4/13 presented with hematochezia, CT angiogram with acute bleeding in the descending colon status post coil embolization of the distal [place] branch of the superior sigmoidal artery. Insulin infusion for high blood sugars.  Admitted to ICU. 4/15, transferred out of ICU. 4/16, afternoon after eating lunch, patient had severe abdominal pain.  Relieved with pain medication.  A CT scan showed visceral perforation.  Underwent urgent exploratory laparotomy and found to have necrotic sigmoid colon.  Status post Jeanette Caprice procedure with colostomy and wound VAC for midline incision. 4/22-5/2, prolonged ileus and not eating.  On TPN. 5/2-5/3, some return of bowel function.  Started noticing a stool in colostomy.  Tolerating liquids.  5/04: Advance to regular diet.   Assessment & Plan:   Active Problems:   GI bleed   Hypotension   Rectal bleeding   AKI (acute kidney injury) ([place])   1-Perforated sigmoid colon with history of diverticulosis and multiple procedures.  IMA embolization 4/13.  Emergency exploratory laparotomy and Hartman's procedure 4/16:  -Postop patient developed prolonged ileus.  Function improving, colostomy with stool. -Plan to advance to regular diet today -TPN to be weaned off today. -Completed 4 days antibiotics post sx for feculent peritonitis.  -Continue with Wound Vac.   2-Acute  lower GI bleeding, diverticular bleed.  Hemorrhagic shock secondary to acute GI bleeding and anemia of blood loss. -Hb  has been stable since surgery, 7.6 -Check iron studies.   3-Acute renal failure: Due to #1.  Improved normalized Monitor.  4-Type 2 Diabetes with hyperglycemia On TPN and insulin with TPN. Plan to  transition to Lantus now that she is going to be off of TPN   5-Neurosarcoidosis, chronic debility and wheelchair-bound status: -Continue with chronic prednisone 40 mg twice daily.  Received preop Solu-Medrol. -While she had persistent ileus she was on IV Solu-Medrol.  6-Obstructive Sleep Apnea: Declined CPAP 7-Hypochloremic, Hyponatremia: Resolved Hypomagnesemia, hypokalemia: Replaced.  8-sinus tachycardia PAC: Plan to increase metoprolol to 50 mg twice daily    Nutrition Problem: Inadequate oral intake Etiology: altered GI function    Signs/Symptoms: other (comment) (NPO/Clear liquid diet)    Interventions: Boost Breeze,TPN  Estimated body mass index is 35.81 kg/m as calculated from the following:   Height as of this encounter: 5\' 3"  (1.6 m).   Weight as of this encounter: 91.7 kg.   DVT prophylaxis: SCD Code Status: Full code Family Communication: care discussed with husband who was at bedside.  Disposition Plan:  Status is: Inpatient  Remains inpatient appropriate because:IV treatments appropriate due to intensity of illness or inability to take PO   Dispo: The patient is from: Home              Anticipated d/c is to: SNF              Patient currently is not medically stable to d/c.   Difficult to place patient No        Consultants:   Sx  CCM  IR  Gastroenterologist   Procedures:  Embolization inferior mesenteric artery 4/13  Hartman's procedure 4/16.    Antimicrobials:    Subjective: She is complaining of abdominal pain. Her diet was advance to regular.   Objective: Vitals:   09/18/20 0957 09/18/20 1336  09/18/20 2055 09/19/20 0442  BP: 140/76 123/82 (!) 141/84 (!) 147/78  Pulse: (!) 116 96 99 98  Resp: 18 18 18 16   Temp: 98.4 F (36.9 C) 98.2 F (36.8 C) 98 F (36.7 C) 97.8 F (36.6 C)  TempSrc: Axillary Axillary Axillary Axillary  SpO2: 98% 99% 100% 97%  Weight:    91.7 kg  Height:        Intake/Output Summary (Last 24 hours) at 09/19/2020 1311 Last data filed at 09/19/2020 1100 Gross per 24 hour  Intake 120 ml  Output 2850 ml  Net -2730 ml   Filed Weights   09/16/20 0417 09/17/20 0640 09/19/20 0442  Weight: 91.5 kg 94.8 kg 91.7 kg    Examination:  General exam: Chronic ill appearing, keep eyes close, answer questions.  Respiratory system: Clear to auscultation. Respiratory effort normal. Cardiovascular system: S1 & S2 heard, RRR. No JVD, murmurs, rubs, gallops or clicks. No pedal edema. Gastrointestinal system: Abdomen is soft, wound vac in place, colostomy in place Central nervous system: Alert , keep eyes close Extremities: No edema    Data Reviewed: I have personally reviewed following labs and imaging studies  CBC: Recent Labs  Lab 09/15/20 0302 09/16/20 0021 09/17/20 0445 09/18/20 0332 09/19/20 0401  WBC 6.5 6.8 6.7 8.4 7.7  NEUTROABS 4.3 4.3 5.6 5.2 5.9  HGB 7.5* 7.6* 7.5* 7.7* 7.6*  HCT 25.2* 25.6* 25.5* 26.4* 25.8*  MCV 98.4 99.6 100.4* 100.0 99.2  PLT 91* 103* 123* 124* 712*   Basic Metabolic Panel: Recent Labs  Lab 09/13/20 0416 09/15/20 0302 09/17/20 0445 09/19/20 0401  NA 135 134* 140 140  K 4.2 4.5 3.9 4.3  CL 101 101 102 103  CO2 28 28 31  32  GLUCOSE 214* 114* 104* 214*  BUN 17 20 17 19   CREATININE 0.45 0.39* 0.40* 0.44  CALCIUM 8.5* 8.1* 8.4* 8.5*  MG 2.0  --  2.1 2.1  PHOS 3.2  --  3.5  --    GFR: Estimated Creatinine Clearance: 81.4 mL/min (by C-G formula based on SCr of 0.44 mg/dL). Liver Function Tests: Recent Labs  Lab 09/13/20 0416 09/15/20 0302 09/17/20 0445  AST 21 25 26   ALT 23 29 29   ALKPHOS 142* 133* 131*   BILITOT 0.5 0.5 0.3  PROT 4.5* 4.1* 4.5*  ALBUMIN 2.1* 1.9* 1.9*   No results for input(s): LIPASE, AMYLASE in the last 168 hours. No results for input(s): AMMONIA in the last 168 hours. Coagulation Profile: No results for input(s): INR, PROTIME in the last 168 hours. Cardiac Enzymes: No results for input(s): CKTOTAL, CKMB, CKMBINDEX, TROPONINI in the last 168 hours. BNP (last 3 results) No results for input(s): PROBNP in the last 8760 hours. HbA1C: No results for input(s): HGBA1C in the last 72 hours. CBG: Recent Labs  Lab 09/18/20 2349 09/19/20 0107 09/19/20 0357 09/19/20 0748 09/19/20 1219  GLUCAP 264* 226* 184* 180* 171*   Lipid Profile: Recent Labs    09/17/20 0445  TRIG 85   Thyroid Function Tests: No results for input(s): TSH, T4TOTAL, FREET4, T3FREE, THYROIDAB in the last 72 hours. Anemia Panel: No results for input(s): VITAMINB12, FOLATE, FERRITIN, TIBC, IRON, RETICCTPCT in the last 72 hours. Sepsis Labs: No results for input(s): PROCALCITON, LATICACIDVEN in the last  168 hours.  No results found for this or any previous visit (from the past 240 hour(s)).       Radiology Studies: No results found.      Scheduled Meds: . Chlorhexidine Gluconate Cloth  6 each Topical Daily  . docusate sodium  100 mg Oral BID  . feeding supplement (GLUCERNA SHAKE)  237 mL Oral TID BM  . gabapentin  100 mg Oral TID  . insulin aspart  0-20 Units Subcutaneous TID WC  . insulin aspart  0-5 Units Subcutaneous QHS  . insulin glargine  15 Units Subcutaneous QHS  . lidocaine  1 patch Transdermal Q24H  . mouth rinse  15 mL Mouth Rinse BID  . melatonin  9 mg Oral QHS  . metoCLOPramide (REGLAN) injection  5 mg Intravenous Q8H  . metoprolol tartrate  25 mg Oral BID  . nystatin  5 mL Oral QID  . pantoprazole  40 mg Oral QHS  . polyethylene glycol  17 g Oral BID  . predniSONE  40 mg Oral BID WC   Continuous Infusions: . TPN ADULT (ION) 90 mL/hr at 09/18/20 1754      LOS: 21 days    Time spent: 35 minutes    Shameika Speelman A Zubin Pontillo, MD [facility]   If 7PM-7AM, please contact night-coverage [email or web address]  09/19/2020, 1:11 PM

## 2020-09-20 ENCOUNTER — Inpatient Hospital Stay (HOSPITAL_COMMUNITY): Payer: 59

## 2020-09-20 DIAGNOSIS — K625 Hemorrhage of anus and rectum: Secondary | ICD-10-CM | POA: Diagnosis not present

## 2020-09-20 LAB — CBC WITH DIFFERENTIAL/PLATELET
Abs Immature Granulocytes: 1.2 10*3/uL — ABNORMAL HIGH (ref 0.00–0.07)
Basophils Absolute: 0.1 10*3/uL (ref 0.0–0.1)
Basophils Relative: 1 %
Eosinophils Absolute: 0 10*3/uL (ref 0.0–0.5)
Eosinophils Relative: 0 %
HCT: 28.9 % — ABNORMAL LOW (ref 36.0–46.0)
Hemoglobin: 8.4 g/dL — ABNORMAL LOW (ref 12.0–15.0)
Immature Granulocytes: 14 %
Lymphocytes Relative: 18 %
Lymphs Abs: 1.6 10*3/uL (ref 0.7–4.0)
MCH: 29 pg (ref 26.0–34.0)
MCHC: 29.1 g/dL — ABNORMAL LOW (ref 30.0–36.0)
MCV: 99.7 fL (ref 80.0–100.0)
Monocytes Absolute: 0.5 10*3/uL (ref 0.1–1.0)
Monocytes Relative: 5 %
Neutro Abs: 5.4 10*3/uL (ref 1.7–7.7)
Neutrophils Relative %: 62 %
Platelets: 164 10*3/uL (ref 150–400)
RBC: 2.9 MIL/uL — ABNORMAL LOW (ref 3.87–5.11)
RDW: 18.7 % — ABNORMAL HIGH (ref 11.5–15.5)
WBC: 8.7 10*3/uL (ref 4.0–10.5)
nRBC: 6.4 % — ABNORMAL HIGH (ref 0.0–0.2)

## 2020-09-20 LAB — FERRITIN: Ferritin: 193 ng/mL (ref 11–307)

## 2020-09-20 LAB — IRON AND TIBC
Iron: 49 ug/dL (ref 28–170)
Saturation Ratios: 22 % (ref 10.4–31.8)
TIBC: 225 ug/dL — ABNORMAL LOW (ref 250–450)
UIBC: 176 ug/dL

## 2020-09-20 LAB — GLUCOSE, CAPILLARY
Glucose-Capillary: 148 mg/dL — ABNORMAL HIGH (ref 70–99)
Glucose-Capillary: 151 mg/dL — ABNORMAL HIGH (ref 70–99)
Glucose-Capillary: 184 mg/dL — ABNORMAL HIGH (ref 70–99)
Glucose-Capillary: 147 mg/dL — ABNORMAL HIGH (ref 70–99)

## 2020-09-20 MED ORDER — LORAZEPAM 2 MG/ML IJ SOLN: 0.5000 mg | Freq: Three times a day (TID) | INTRAMUSCULAR | Status: DC | PRN

## 2020-09-20 MED ORDER — LACTATED RINGERS IV SOLN: INTRAVENOUS | Status: DC

## 2020-09-20 MED ORDER — IOHEXOL 300 MG/ML  SOLN
100.0000 mL | Freq: Once | INTRAMUSCULAR | Status: AC | PRN
  Administered 2020-09-20: 100 mL via INTRAVENOUS

## 2020-09-20 MED ORDER — PANTOPRAZOLE SODIUM 40 MG IV SOLR
40.0000 mg | Freq: Every day | INTRAVENOUS | Status: DC
  Administered 2020-09-21 – 2020-09-25 (×5): 40 mg via INTRAVENOUS
  Filled 2020-09-20 (×5): qty 40

## 2020-09-20 MED ORDER — METOPROLOL TARTRATE 5 MG/5ML IV SOLN
5.0000 mg | Freq: Four times a day (QID) | INTRAVENOUS | Status: DC
  Administered 2020-09-21 – 2020-09-28 (×30): 5 mg via INTRAVENOUS
  Filled 2020-09-20 (×30): qty 5

## 2020-09-20 NOTE — Progress Notes (Signed)
PROGRESS NOTE    Rhonda [name]  TKZ:[id number] DOB: 08/13/1960 DOA: 08/29/2020 PCP: [place].   Brief Narrative: 60 year old with past medical history significant for hypertension, obesity, obstructive sleep apnea not using CPAP, neurosarcoidosis on chronic prednisone, wheelchair-bound due to chronic debility, recurrent GI bleeding with recent multiple hospitalization presented back on 4/13 to the emergency room with frank rectal bleeding and hypotension.  Significant events:   4/4-4/6 Admitted with hematochezia, symptomatically treated, capsule endoscopy and discharged home. 4/13 presented with hematochezia, CT angiogram with acute bleeding in the descending colon status post coil embolization of the distal [place] branch of the superior sigmoidal artery. Insulin infusion for high blood sugars.  Admitted to ICU. 4/15, transferred out of ICU. 4/16, afternoon after eating lunch, patient had severe abdominal pain.  Relieved with pain medication.  A CT scan showed visceral perforation.  Underwent urgent exploratory laparotomy and found to have necrotic sigmoid colon.  Status post Jeanette Caprice procedure with colostomy and wound VAC for midline incision. 4/22-5/2, prolonged ileus and not eating.  On TPN. 5/2-5/3, some return of bowel function.  Started noticing a stool in colostomy.  Tolerating liquids.  5/04: Advance to regular diet.   Assessment & Plan:   Active Problems:   GI bleed   Hypotension   Rectal bleeding   AKI (acute kidney injury) ([place])   1-Perforated sigmoid colon with history of diverticulosis and multiple procedures.  IMA embolization 4/13.  Emergency exploratory laparotomy and Hartman's procedure 4/16:  -Postop patient developed prolonged ileus.  Function improving, colostomy with stool. -Plan to advance to regular diet today -TPN to be weaned off today. -Completed 4 days antibiotics post sx for feculent peritonitis.  -Continue with Wound Vac.  Tolerating  diet.   2-Acute lower GI bleeding, diverticular bleed.  Hemorrhagic shock secondary to acute GI bleeding and anemia of blood loss. -Hb  has been stable since surgery, 7.6 -iron studies normal.  -hb stable.   3-Acute renal failure: Due to #1.  Improved normalized Monitor.  4-Type 2 Diabetes with hyperglycemia On TPN and insulin with TPN. Plan to  transition to Lantus now that she is going to be off of TPN   5-Neurosarcoidosis, chronic debility and wheelchair-bound status: -Continue with chronic prednisone 40 mg twice daily.  Received preop Solu-Medrol. -While she had persistent ileus she was on IV Solu-Medrol.  6-Obstructive Sleep Apnea: Declined CPAP 7-Hypochloremic, Hyponatremia: Resolved Hypomagnesemia, hypokalemia: Replaced.  8-sinus tachycardia PAC: Plan to increase metoprolol to 50 mg twice daily improved   Nutrition Problem: Inadequate oral intake Etiology: altered GI function    Signs/Symptoms: other (comment) (NPO/Clear liquid diet)    Interventions: Boost Breeze,TPN  Estimated body mass index is 34.09 kg/m as calculated from the following:   Height as of this encounter: 5\' 3"  (1.6 m).   Weight as of this encounter: 87.3 kg.   DVT prophylaxis: SCD Code Status: Full code Family Communication: care discussed with husband who was at bedside.  Disposition Plan:  Status is: Inpatient  Remains inpatient appropriate because:IV treatments appropriate due to intensity of illness or inability to take PO   Dispo: The patient is from: Home              Anticipated d/c is to: SNF              Patient currently is medically stable to d/c.   Difficult to place patient No        Consultants:   Sx  CCM  IR  Gastroenterologist   Procedures:   Embolization inferior mesenteric artery 4/13  Hartman's procedure 4/16.    Antimicrobials:    Subjective: She is feeling better. She ate all her breakfast. Still with some abdominal pain    Objective: Vitals:   09/20/20 0400 09/20/20 0500 09/20/20 0804 09/20/20 1441  BP: 132/77   131/77  Pulse: 81   71  Resp: 20   18  Temp:    98.7 F (37.1 C)  TempSrc:    Oral  SpO2: 99%  99% 97%  Weight:  87.3 kg    Height:        Intake/Output Summary (Last 24 hours) at 09/20/2020 1509 Last data filed at 09/20/2020 1300 Gross per 24 hour  Intake 920 ml  Output 400 ml  Net 520 ml   Filed Weights   09/17/20 0640 09/19/20 0442 09/20/20 0500  Weight: 94.8 kg 91.7 kg 87.3 kg    Examination:  General exam: Chronic ill appearing, alert Respiratory system: CTA Cardiovascular system: S 1, S 2 RRR Gastrointestinal system: BS present, soft, wound vac in place, ostomy in place Central nervous system: Alert, answer questions.  Extremities: No edema    Data Reviewed: I have personally reviewed following labs and imaging studies  CBC: Recent Labs  Lab 09/16/20 0021 09/17/20 0445 09/18/20 0332 09/19/20 0401 09/20/20 0435  WBC 6.8 6.7 8.4 7.7 8.7  NEUTROABS 4.3 5.6 5.2 5.9 5.4  HGB 7.6* 7.5* 7.7* 7.6* 8.4*  HCT 25.6* 25.5* 26.4* 25.8* 28.9*  MCV 99.6 100.4* 100.0 99.2 99.7  PLT 103* 123* 124* 147* 710   Basic Metabolic Panel: Recent Labs  Lab 09/15/20 0302 09/17/20 0445 09/19/20 0401  NA 134* 140 140  K 4.5 3.9 4.3  CL 101 102 103  CO2 28 31 32  GLUCOSE 114* 104* 214*  BUN 20 17 19   CREATININE 0.39* 0.40* 0.44  CALCIUM 8.1* 8.4* 8.5*  MG  --  2.1 2.1  PHOS  --  3.5  --    GFR: Estimated Creatinine Clearance: 79.4 mL/min (by C-G formula based on SCr of 0.44 mg/dL). Liver Function Tests: Recent Labs  Lab 09/15/20 0302 09/17/20 0445  AST 25 26  ALT 29 29  ALKPHOS 133* 131*  BILITOT 0.5 0.3  PROT 4.1* 4.5*  ALBUMIN 1.9* 1.9*   No results for input(s): LIPASE, AMYLASE in the last 168 hours. No results for input(s): AMMONIA in the last 168 hours. Coagulation Profile: No results for input(s): INR, PROTIME in the last 168 hours. Cardiac Enzymes: No  results for input(s): CKTOTAL, CKMB, CKMBINDEX, TROPONINI in the last 168 hours. BNP (last 3 results) No results for input(s): PROBNP in the last 8760 hours. HbA1C: No results for input(s): HGBA1C in the last 72 hours. CBG: Recent Labs  Lab 09/19/20 1219 09/19/20 1640 09/19/20 2135 09/20/20 0752 09/20/20 1142  GLUCAP 171* 197* 215* 148* 151*   Lipid Profile: No results for input(s): CHOL, HDL, LDLCALC, TRIG, CHOLHDL, LDLDIRECT in the last 72 hours. Thyroid Function Tests: No results for input(s): TSH, T4TOTAL, FREET4, T3FREE, THYROIDAB in the last 72 hours. Anemia Panel: Recent Labs    09/20/20 0435  FERRITIN 193  TIBC 225*  IRON 49   Sepsis Labs: No results for input(s): PROCALCITON, LATICACIDVEN in the last 168 hours.  No results found for this or any previous visit (from the past 240 hour(s)).       Radiology Studies: No results found.      Scheduled Meds: . Chlorhexidine Gluconate Cloth  6 each Topical Daily  . docusate sodium  100 mg Oral BID  . feeding supplement (GLUCERNA SHAKE)  237 mL Oral TID BM  . gabapentin  100 mg Oral TID  . insulin aspart  0-20 Units Subcutaneous TID WC  . insulin aspart  0-5 Units Subcutaneous QHS  . insulin glargine  15 Units Subcutaneous QHS  . lidocaine  1 patch Transdermal Q24H  . mouth rinse  15 mL Mouth Rinse BID  . melatonin  9 mg Oral QHS  . metoprolol tartrate  50 mg Oral BID  . nystatin  5 mL Oral QID  . pantoprazole  40 mg Oral QHS  . polyethylene glycol  17 g Oral BID  . predniSONE  40 mg Oral BID WC   Continuous Infusions:    LOS: 22 days    Time spent: 35 minutes    Rhonda Skyles A Ashly Yepez, MD [facility]   If 7PM-7AM, please contact night-coverage [email or web address]  09/20/2020, 3:09 PM

## 2020-09-20 NOTE — Progress Notes (Signed)
Patient had just a few bites for lunch and she also had a couple of bites for dinner and vomited.  Attending and general surgery notified.

## 2020-09-20 NOTE — Significant Event (Signed)
Called by radiology.  KUB concerning for SBO and possible bowel infarction!  1) making pt NPO, IVF at 100 cc/hr 2) stat CT abd/pelvis W IV contrast only  Notified RN  Will need to put in further orders and management depending on CT findings.

## 2020-09-20 NOTE — Progress Notes (Signed)
Physical Therapy Treatment Patient Details Name: Rhonda [name] MRN: [id number] DOB: 1961-03-19 Today's Date: 09/20/2020    History of Present Illness 60 yo female presented to [facility] on 08/29/20 with rectal bleeding (several recent admissions for similar). Pt admitted with hemorrhagic shock.  She is s/p IR for coil embolization of intraluminal extravasation for a distal arcade branch of the IMA on 08/29/20.  She developed abdominal pain and found to have SBO with bowel perforation s/p emergent exp lap, partial colectomy, colostomy, Hartman's and wound vac application 6/75/91.  Medical hx inlcudes DM2, seizures, HTN, DDD, recurrent GIB.  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    PT Comments    Patient received in bed, pleasant and cooperative, motivated to get OOB to the recliner. Still needs 2 person assist, but efficiency of and tolerance to movement were much better today! Needed ModAx2 for standing in the stedy, then totalA to pivot to recliner in the stedy- very fatigued afterwards, but VSS today with HR only to 108BPM. Left up in recliner with all needs met, spouse present. Continue to recommend SNF.    Follow Up Recommendations  SNF     Equipment Recommendations  None recommended by PT    Recommendations for Other Services       Precautions / Restrictions Precautions Precautions: Fall Precaution Comments: Abdomen incision with wound vac, ostomy Restrictions Weight Bearing Restrictions: No    Mobility  Bed Mobility Overal bed mobility: Needs Assistance Bed Mobility: Rolling;Sidelying to Sit Rolling: Mod assist   Supine to sit: Mod assist;+2 for physical assistance;HOB elevated     General bed mobility comments: able to roll better, but still requiring ModAx2 to get to EOB; initial posterior lean and needed ModA to scoot to EOB    Transfers Overall transfer level: Needs assistance Equipment used: Ambulation equipment  used Transfers: Sit to/from [facility] Sit to Stand: Mod assist;+2 physical assistance Stand pivot transfers: Total assist;+2 physical assistance       General transfer comment: ModAx2 to come to full upright standing in stedy, difficulty with trunk control as she tends to immediately "flop" onto the crossbar of the stedy; totalA to pivot in the stedy  Ambulation/Gait             General Gait Details: unable- fatigue   Stairs             Wheelchair Mobility    Modified Rankin (Stroke Patients Only)       Balance Overall balance assessment: Needs assistance Sitting-balance support: Bilateral upper extremity supported;Feet supported Sitting balance-[name]: Fair Sitting balance - Comments: posterior lean, fatigues easily   Standing balance support: Bilateral upper extremity supported Standing balance-[name]: Poor Standing balance comment: reliant on BUE support and external assist                            Cognition Arousal/Alertness: Awake/alert Behavior During Therapy: WFL for tasks assessed/performed;Flat affect Overall Cognitive Status: History of cognitive impairments - at baseline                                 General Comments: seems at baseline cognitively      Exercises      General Comments General comments (skin integrity, edema, etc.): HR no more than 108BPM today, VSS otherwise      Pertinent Vitals/Pain Pain  Assessment: Faces Faces Pain Scale: Hurts little more Pain Location: abdomen Pain Descriptors / Indicators: Aching;Sore Pain Intervention(s): Monitored during session;Limited activity within patient's tolerance    Home Living                      Prior Function            PT Goals (current goals can now be found in the care plan section) Acute Rehab PT Goals Patient Stated Goal: be able to improve PT Goal Formulation: With patient/family Time For Goal  Achievement: 10/01/20 Potential to Achieve Goals: Good Progress towards PT goals: Progressing toward goals    Frequency    Min 2X/week      PT Plan Current plan remains appropriate    Co-evaluation              AM-PAC PT "6 Clicks" Mobility   Outcome Measure  Help needed turning from your back to your side while in a flat bed without using bedrails?: A Lot Help needed moving from lying on your back to sitting on the side of a flat bed without using bedrails?: Total Help needed moving to and from a bed to a chair (including a wheelchair)?: Total Help needed standing up from a chair using your arms (e.g., wheelchair or bedside chair)?: Total Help needed to walk in hospital room?: Total Help needed climbing 3-5 steps with a railing? : Total 6 Click Score: 7    End of Session   Activity Tolerance: Patient tolerated treatment well Patient left: in chair;with call bell/phone within reach;with family/visitor present Nurse Communication: Mobility status;Need for lift equipment PT Visit Diagnosis: Other abnormalities of gait and mobility (R26.89);Muscle weakness (generalized) (M62.81);Pain Pain - part of body:  (abdomen and back)     Time: 0301-3143 PT Time Calculation (min) (ACUTE ONLY): 17 min  Charges:  $Therapeutic Activity: 8-22 mins                     Windell Norfolk, DPT, PN1   Supplemental Physical Therapist [place]    Pager [phone] Acute Rehab Office [phone]

## 2020-09-20 NOTE — TOC Progression Note (Signed)
Transition of Care [facility]) - Progression Note    Patient Details  Name: DENALI SHARMA MRN: [id number] Date of Birth: 10/31/1960  Transition of Care [facility]) CM/SW [place], LCSW Phone Number: 09/20/2020, 10:23 AM  Clinical Narrative:    CSW awaiting response form [facility] on if they require anything else for insurance authorization or wound vac. CSW left voicemail for [place] to see if there was a way to expedite authorization.    Expected Discharge Plan: [place] Barriers to Discharge: Insurance Authorization,Continued Medical Work up  Expected Discharge Plan and Services Expected Discharge Plan: [place] In-house Referral: Clinical Social Work   Post Acute Care Choice: [place] Living arrangements for the past 2 months: Single Family Home                                       Social Determinants of Health (SDOH) Interventions    Readmission Risk Interventions Readmission Risk Prevention Plan 09/12/2020 09/04/2020  Transportation Screening Complete Complete  Medication Review [employer]) - Referral to Pharmacy  PCP or Specialist appointment within 3-5 days of discharge Complete Complete  HRI or Home Care Consult Complete Complete  SW Recovery Care/Counseling Consult Complete Complete  Palliative Care Screening Not Applicable Not Applicable  Skilled Nursing Facility Complete Complete  Some recent data might be hidden

## 2020-09-20 NOTE — Progress Notes (Addendum)
Called by radiologist again after CT scan done:  Persistent and worrisome SBO at least.  Now also looks like developing pneumatosis and pleural venous gas.  Worrisome for bowel infarction in pretty much all the proximal small bowel.  Additionally may also have PE in RLL!  Additionally may also have cystitis too.  1) Calling gen surg to let them know STAT 2) strict NPO 3) check UA 4) convert all meds to IV (will consult pharm to do this). 5) lactate ordered and pending 6) will address the possible PE after we figure out what surgery plans to do (plan heparin gtt if not going to OR). 7) DC glycolax  Addendum: spoke with Dr. Bobbye Morton: 1) pt 3 weeks out from surgery.  Doesn't really think shes a candidate to go back to OR right now. 2) NPO 3) NGT 4) okay to start heparin gtt, do with no bolus though as she is at risk for hemorrhagic necrosis 5) she will put note in chart. 6) rocephin + flagyl empirically for intra-abdominal coverage. 7) converting insulin to Q4H  Lactate pending.  Odd that pt not having severe abd pain symptoms despite the CT findings.Marland KitchenMarland Kitchen

## 2020-09-20 NOTE — Progress Notes (Signed)
Progress Note  19 Days Post-Op  Subjective: Patient tolerating diet and denies pain this AM. Having stool output. Updated husband at bedside as well.   Objective: Vital signs in last 24 hours: Temp:  [97.9 F (36.6 C)-98.7 F (37.1 C)] 98 F (36.7 C) (05/05 0314) Pulse Rate:  [81-100] 81 (05/05 0400) Resp:  [17-20] 20 (05/05 0400) BP: (107-132)/(77-92) 132/77 (05/05 0400) SpO2:  [96 %-99 %] 99 % (05/05 0804) Weight:  [87.3 kg] 87.3 kg (05/05 0500) Last BM Date: 09/19/20  Intake/Output from previous day: 05/04 0701 - 05/05 0700 In: 560 [P.O.:560] Out: 1300 [Urine:1300] Intake/Output this shift: No intake/output data recorded.  PE: General: pleasant, WD,obesefemale who is laying in bed in NAD Heart:RRR Lungs: CTAB, no wheezes, rhonchi, or rales noted. Respiratory effort nonlabored Abd: soft,appropriately ttp,not distended,VAC to midline, stoma with soft brown stool in appliance    Lab Results:  Recent Labs    09/19/20 0401 09/20/20 0435  WBC 7.7 8.7  HGB 7.6* 8.4*  HCT 25.8* 28.9*  PLT 147* 164   BMET Recent Labs    09/19/20 0401  NA 140  K 4.3  CL 103  CO2 32  GLUCOSE 214*  BUN 19  CREATININE 0.44  CALCIUM 8.5*   PT/INR No results for input(s): LABPROT, INR in the last 72 hours. CMP     Component Value Date/Time   NA 140 09/19/2020 0401   NA 129 (L) 08/06/2020 1003   K 4.3 09/19/2020 0401   CL 103 09/19/2020 0401   CO2 32 09/19/2020 0401   GLUCOSE 214 (H) 09/19/2020 0401   BUN 19 09/19/2020 0401   BUN 6 08/06/2020 1003   CREATININE 0.44 09/19/2020 0401   CREATININE 0.85 08/02/2015 1710   CALCIUM 8.5 (L) 09/19/2020 0401   PROT 4.5 (L) 09/17/2020 0445   PROT 4.9 (L) 08/06/2020 1003   ALBUMIN 1.9 (L) 09/17/2020 0445   ALBUMIN 3.2 (L) 08/06/2020 1003   AST 26 09/17/2020 0445   ALT 29 09/17/2020 0445   ALKPHOS 131 (H) 09/17/2020 0445   BILITOT 0.3 09/17/2020 0445   BILITOT 0.4 08/06/2020 1003   GFRNONAA >60 09/19/2020 0401    GFRNONAA 78 08/02/2015 1710   GFRAA >60 01/17/2020 0546   GFRAA >89 08/02/2015 1710   Lipase     Component Value Date/Time   LIPASE 64 (H) 07/23/2020 0432       Studies/Results: No results found.  Anti-infectives: Anti-infectives (From admission, onward)   Start     Dose/Rate Route Frequency Ordered Stop   09/01/20 1930  ciprofloxacin (CIPRO) IVPB 400 mg        400 mg 200 mL/hr over 60 Minutes Intravenous Every 12 hours 09/01/20 1836 09/05/20 1927   09/01/20 1930  metroNIDAZOLE (FLAGYL) IVPB 500 mg        500 mg 100 mL/hr over 60 Minutes Intravenous Every 8 hours 09/01/20 1836 09/05/20 2124       Assessment/Plan Hx of HTN DM2 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  OSA ABL anemia- hgb8.4, stable - above per [facility]-  Ms. Rhonda [name] is a 60 year old female with a history of GI bleed s/p angioembolization of a branch of the IMA on 4/13, who developed a necrotic section of proximal sigmoid colon with perforation and feculent peritonitis S/pExploratory Laparotomy, Partial Colectomy (sigmoid), colostomy creation,Hartman's procedure on 4/16/2022by Dr. Grandville Silos - POD #19 -patient is tolerating diet and having bowel function  - separation of stoma from 12-6 o'clock but remains functioning and  above the fascia  -Completed4 days post opabxdue to feculent peritonitis. Afebrile and wbc wnl (on chronic steroids). - WOC following for ostomy education -Vac changes MWF - Patient is stable for discharge to SNF from a surgical perspective when bed available   FEN -HH/CM diet  VTE -SCDs,okay for chemical prophylaxis from a general surgery standpoint ID -Cipro/Flagyl4/16- 4/20. None currently Foley - None  LOS: 22 days    Norm Parcel, [facility] Surgery 09/20/2020, 9:41 AM Please see Amion for pager number during day hours 7:00am-4:30pm

## 2020-09-21 ENCOUNTER — Inpatient Hospital Stay (HOSPITAL_COMMUNITY): Payer: 59

## 2020-09-21 DIAGNOSIS — R609 Edema, unspecified: Secondary | ICD-10-CM | POA: Diagnosis not present

## 2020-09-21 DIAGNOSIS — K9189 Other postprocedural complications and disorders of digestive system: Secondary | ICD-10-CM | POA: Diagnosis not present

## 2020-09-21 DIAGNOSIS — K922 Gastrointestinal hemorrhage, unspecified: Secondary | ICD-10-CM | POA: Diagnosis not present

## 2020-09-21 DIAGNOSIS — K567 Ileus, unspecified: Secondary | ICD-10-CM

## 2020-09-21 LAB — URINALYSIS, ROUTINE W REFLEX MICROSCOPIC
Bilirubin Urine: NEGATIVE
Glucose, UA: NEGATIVE mg/dL
Ketones, ur: NEGATIVE mg/dL
Nitrite: NEGATIVE
Protein, ur: 30 mg/dL — AB
Specific Gravity, Urine: 1.017 (ref 1.005–1.030)
pH: 9 — ABNORMAL HIGH (ref 5.0–8.0)

## 2020-09-21 LAB — GLUCOSE, CAPILLARY
Glucose-Capillary: 101 mg/dL — ABNORMAL HIGH (ref 70–99)
Glucose-Capillary: 114 mg/dL — ABNORMAL HIGH (ref 70–99)
Glucose-Capillary: 123 mg/dL — ABNORMAL HIGH (ref 70–99)
Glucose-Capillary: 142 mg/dL — ABNORMAL HIGH (ref 70–99)
Glucose-Capillary: 175 mg/dL — ABNORMAL HIGH (ref 70–99)
Glucose-Capillary: 90 mg/dL (ref 70–99)

## 2020-09-21 LAB — COMPREHENSIVE METABOLIC PANEL
ALT: 57 U/L — ABNORMAL HIGH (ref 0–44)
AST: 36 U/L (ref 15–41)
Albumin: 2 g/dL — ABNORMAL LOW (ref 3.5–5.0)
Alkaline Phosphatase: 201 U/L — ABNORMAL HIGH (ref 38–126)
Anion gap: 8 (ref 5–15)
BUN: 16 mg/dL (ref 6–20)
CO2: 30 mmol/L (ref 22–32)
Calcium: 8.1 mg/dL — ABNORMAL LOW (ref 8.9–10.3)
Chloride: 95 mmol/L — ABNORMAL LOW (ref 98–111)
Creatinine, Ser: 0.45 mg/dL (ref 0.44–1.00)
GFR, Estimated: >60 mL/min (ref >60)
Glucose, Bld: 143 mg/dL — ABNORMAL HIGH (ref 70–99)
Potassium: 4.1 mmol/L (ref 3.5–5.1)
Sodium: 133 mmol/L — ABNORMAL LOW (ref 135–145)
Total Bilirubin: 0.5 mg/dL (ref 0.3–1.2)
Total Protein: 4.9 g/dL — ABNORMAL LOW (ref 6.5–8.1)

## 2020-09-21 LAB — CBC
HCT: 30.3 % — ABNORMAL LOW (ref 36.0–46.0)
Hemoglobin: 8.9 g/dL — ABNORMAL LOW (ref 12.0–15.0)
MCH: 29 pg (ref 26.0–34.0)
MCHC: 29.4 g/dL — ABNORMAL LOW (ref 30.0–36.0)
MCV: 98.7 fL (ref 80.0–100.0)
Platelets: 202 10*3/uL (ref 150–400)
RBC: 3.07 MIL/uL — ABNORMAL LOW (ref 3.87–5.11)
RDW: 18.4 % — ABNORMAL HIGH (ref 11.5–15.5)
WBC: 11 10*3/uL — ABNORMAL HIGH (ref 4.0–10.5)
nRBC: 6.6 % — ABNORMAL HIGH (ref 0.0–0.2)

## 2020-09-21 LAB — LACTIC ACID, PLASMA
Lactic Acid, Venous: 1.2 mmol/L (ref 0.5–1.9)
Lactic Acid, Venous: 1.3 mmol/L (ref 0.5–1.9)

## 2020-09-21 LAB — HEPARIN LEVEL (UNFRACTIONATED): Heparin Unfractionated: 0.42 IU/mL (ref 0.30–0.70)

## 2020-09-21 MED ORDER — DEXTROSE IN LACTATED RINGERS 5 % IV SOLN: INTRAVENOUS | Status: AC

## 2020-09-21 MED ORDER — HYDROMORPHONE HCL 1 MG/ML IJ SOLN
0.2500 mg | INTRAMUSCULAR | Status: DC | PRN
  Administered 2020-09-21 – 2020-09-24 (×4): 0.25 mg via INTRAVENOUS
  Filled 2020-09-21 (×4): qty 0.5

## 2020-09-21 MED ORDER — SODIUM CHLORIDE 0.9 % IV SOLN
2.0000 g | Freq: Every day | INTRAVENOUS | Status: DC
  Administered 2020-09-21 – 2020-09-27 (×8): 2 g via INTRAVENOUS
  Filled 2020-09-21 (×8): qty 20

## 2020-09-21 MED ORDER — METRONIDAZOLE 500 MG/100ML IV SOLN
500.0000 mg | Freq: Three times a day (TID) | INTRAVENOUS | Status: DC
  Administered 2020-09-21 – 2020-09-28 (×23): 500 mg via INTRAVENOUS
  Filled 2020-09-21 (×23): qty 100

## 2020-09-21 MED ORDER — INSULIN ASPART 100 UNIT/ML IJ SOLN
0.0000 [IU] | INTRAMUSCULAR | Status: DC
  Administered 2020-09-21: 2 [IU] via SUBCUTANEOUS
  Administered 2020-09-21 (×2): 1 [IU] via SUBCUTANEOUS
  Administered 2020-09-22 (×2): 2 [IU] via SUBCUTANEOUS

## 2020-09-21 MED ORDER — METHYLPREDNISOLONE SODIUM SUCC 40 MG IJ SOLR
40.0000 mg | Freq: Two times a day (BID) | INTRAMUSCULAR | Status: DC
  Administered 2020-09-21 – 2020-09-26 (×11): 40 mg via INTRAVENOUS
  Filled 2020-09-21 (×11): qty 1

## 2020-09-21 MED ORDER — HEPARIN (PORCINE) 25000 UT/250ML-% IV SOLN
950.0000 [IU]/h | INTRAVENOUS | Status: DC
  Administered 2020-09-21 – 2020-09-22 (×2): 1000 [IU]/h via INTRAVENOUS
  Administered 2020-09-23 – 2020-09-26 (×3): 800 [IU]/h via INTRAVENOUS
  Administered 2020-09-27: 950 [IU]/h via INTRAVENOUS
  Filled 2020-09-21 (×7): qty 250

## 2020-09-21 MED ORDER — IOHEXOL 350 MG/ML SOLN
50.0000 mL | Freq: Once | INTRAVENOUS | Status: AC | PRN
  Administered 2020-09-21: 50 mL via INTRAVENOUS

## 2020-09-21 MED ORDER — HYDROMORPHONE HCL 1 MG/ML IJ SOLN
0.5000 mg | INTRAMUSCULAR | Status: DC | PRN
  Administered 2020-09-21 (×2): 0.5 mg via INTRAVENOUS
  Administered 2020-09-22: 1 mg via INTRAVENOUS
  Administered 2020-09-22 – 2020-09-23 (×2): 0.5 mg via INTRAVENOUS
  Filled 2020-09-21: qty 1
  Filled 2020-09-21: qty 0.5
  Filled 2020-09-21: qty 1
  Filled 2020-09-21 (×3): qty 0.5

## 2020-09-21 NOTE — Progress Notes (Signed)
Progress Note  20 Days Post-Op  Subjective: Patient had increased abdominal pain overnight. CT AP showed pneumatosis. Patient still having bowel function, pain improved this AM. NGT inserted but was not connected to LIWS. Further workup for PE per primary team.   Objective: Vital signs in last 24 hours: Temp:  [98.1 F (36.7 C)-98.7 F (37.1 C)] 98.1 F (36.7 C) (05/06 0420) Pulse Rate:  [71-100] 85 (05/06 0420) Resp:  [18-20] 18 (05/06 0420) BP: (111-131)/(65-95) 111/67 (05/06 0420) SpO2:  [97 %-100 %] 100 % (05/06 0420) Weight:  [88 kg] 88 kg (05/06 0422) Last BM Date: 09/19/20  Intake/Output from previous day: 05/05 0701 - 05/06 0700 In: 360 [P.O.:360] Out: 1050 [Urine:1000; Drains:50] Intake/Output this shift: No intake/output data recorded.  PE: General: pleasant, WD,obesefemale who is laying in bed in NAD Heart:RRR Lungs: CTAB, no wheezes, rhonchi, or rales noted. Respiratory effort nonlabored Abd: soft,appropriately ttp,moredistended,midline woundwith granulation tissue and some fibrinous exudate and inferiortract 4.5 cm. Stoma with significant separation from 12-3 o'clock, viable and well above fascial level,liquidstool and gas present in ostomy appliance        Lab Results:  Recent Labs    09/20/20 0435 09/21/20 0242  WBC 8.7 11.0*  HGB 8.4* 8.9*  HCT 28.9* 30.3*  PLT 164 202   BMET Recent Labs    09/19/20 0401 09/21/20 0242  NA 140 133*  K 4.3 4.1  CL 103 95*  CO2 32 30  GLUCOSE 214* 143*  BUN 19 16  CREATININE 0.44 0.45  CALCIUM 8.5* 8.1*   PT/INR No results for input(s): LABPROT, INR in the last 72 hours. CMP     Component Value Date/Time   NA 133 (L) 09/21/2020 0242   NA 129 (L) 08/06/2020 1003   K 4.1 09/21/2020 0242   CL 95 (L) 09/21/2020 0242   CO2 30 09/21/2020 0242   GLUCOSE 143 (H) 09/21/2020 0242   BUN 16 09/21/2020 0242   BUN 6 08/06/2020 1003   CREATININE 0.45 09/21/2020 0242   CREATININE 0.85 08/02/2015  1710   CALCIUM 8.1 (L) 09/21/2020 0242   PROT 4.9 (L) 09/21/2020 0242   PROT 4.9 (L) 08/06/2020 1003   ALBUMIN 2.0 (L) 09/21/2020 0242   ALBUMIN 3.2 (L) 08/06/2020 1003   AST 36 09/21/2020 0242   ALT 57 (H) 09/21/2020 0242   ALKPHOS 201 (H) 09/21/2020 0242   BILITOT 0.5 09/21/2020 0242   BILITOT 0.4 08/06/2020 1003   GFRNONAA >60 09/21/2020 0242   GFRNONAA 78 08/02/2015 1710   GFRAA >60 01/17/2020 0546   GFRAA >89 08/02/2015 1710   Lipase     Component Value Date/Time   LIPASE 64 (H) 07/23/2020 0432       Studies/Results: DG Abd 1 View  Addendum Date: 09/20/2020   ADDENDUM REPORT: 09/20/2020 22:07 ADDENDUM: These results were called by telephone at the time of interpretation on 09/20/2020 at 10:03 pm to provider Sheran Luz, MD , who verbally acknowledged these results. Electronically Signed   By: Fidela Salisbury MD   On: 09/20/2020 22:07   Result Date: 09/20/2020 CLINICAL DATA:  Nausea EXAM: ABDOMEN - 1 VIEW COMPARISON:  09/14/2020 FINDINGS: Multiple gas-filled markedly dilated loops of small bowel are seen throughout the abdomen. There is, additionally, gas-filled nondilated loops of small bowel seen within the pelvis as well as a small amount of gas seen within the ascending colon. Together, the findings suggest changes of a high-grade partial or developing complete mid small bowel obstruction. additionally, there is  pneumatosis identified within the left mid abdomen involving a dilated loop of small bowel as well as possible portal venous gas within the right upper quadrant possibly indicating bowel ischemia/infarction. No gross free intraperitoneal gas. No organomegaly. No acute bone abnormality. IMPRESSION: Findings in keeping with a high-grade partial or developing complete mid small bowel obstruction. Pneumatosis and portal venous gas in keeping with bowel ischemia and/or infarction. Dedicated contrast enhanced CT examination of the abdomen and pelvis is recommended, preferably without  administration of oral contrast. Electronically Signed: By: Fidela Salisbury MD On: 09/20/2020 21:53   CT ABDOMEN PELVIS W CONTRAST  Result Date: 09/20/2020 CLINICAL DATA:  Bowel obstruction suspected EXAM: CT ABDOMEN AND PELVIS WITH CONTRAST TECHNIQUE: Multidetector CT imaging of the abdomen and pelvis was performed using the standard protocol following bolus administration of intravenous contrast. CONTRAST:  176mL OMNIPAQUE IOHEXOL 300 MG/ML  SOLN COMPARISON:  Radiograph 09/20/2020, CT 09/09/2020 FINDINGS: Lower chest: Patchy opacity present in the right lung base, possibly atelectatic or early airspace disease. Questionable pulmonary embolism in the right lower lobar pulmonary though above the margins of imaging. Normal heart size. No pericardial effusion. Hepatobiliary: Normal hepatic attenuation. Smooth liver surface contour. Extensive branching lucencies in the anti dependent portions of the liver are in a distribution most suggestive of portal venous gas with more central part of venous gas in the left portal vein. Stable hypoattenuating focus adjacent the gallbladder fossa, possible focal fatty infiltration. No worrisome focal liver lesion. Gallbladder is unremarkable without visible calcified gallstones or biliary ductal dilatation. Pancreas: Stable hypoattenuating collection along the anterior head-body junction of the pancreas (3/26) measuring 3.5 by 1 cm in maximum transaxial dimension. Additional small hypoattenuating focus towards the ventral aspect of the pancreatic head as well measuring up to 9 mm in size (3/30)., could reflect sequela of prior pancreatic inflammation without active inflammatory changes of the pancreas at this time. No pancreatic ductal dilatation or other concerning pancreatic lesions. Spleen: Normal in size. No concerning splenic lesions. Adrenals/Urinary Tract: Lobular thickening of the adrenal glands is similar to priors, no concerning focal adrenal lesion. Kidneys are normally  located with symmetric enhancementand excretion. Subcentimeter hypoattenuating focus in the left kidney too small to fully characterize on CT imaging but statistically likely benign. No suspicious renal lesion, urolithiasis or hydronephrosis. Mild bladder wall thickening. Stomach/Bowel: Distal esophagus, stomach and duodenum are free of acute abnormality. Air and fluid distention of much of the proximal small bowel focal segment of borderline thickened jejunum in the left upper quadrant with extensive pneumatosis (3/48) dilatation both upstream and downstream of this segment with additional dilated small bowel in the right lower quadrant demonstrating pneumatosis as well (3/35) some focal thickening and transition noted in the right mid abdomen (5/81). This is in similar vicinity to the comparison study. More distal small bowel is less distended, partly decompressed. Normal appendix in the right lower quadrant. Pancolonic diverticulosis. Fluid-filled appearance of the colon may reflect some rapid transit state. A left lower quadrant end colostomy is noted. Colonic remnant is grossly unremarkable. Vascular/Lymphatic: Atherosclerotic calcifications within the abdominal aorta and branch vessels. No aneurysm or ectasia. Extensive intravenous gas noted within the draining mesentery veins, SM V and portal veins (3/38 for example) no enlarged abdominopelvic lymph nodes. Reproductive: Uterus is surgically absent. No concerning adnexal lesions. Quiescent appearance of the ovarian tissue. Other: Postsurgical changes of the anterior ventral abdomen with overlying vacuum drain. Left lower quadrant ostomy. Lateral body wall edema, left greater than right. Musculoskeletal: Intramuscular lipoma in the  left gluteal musculature. No suspicious intramuscular mass or lesion. Multilevel degenerative changes are present in the imaged portions of the spine. Grade 1 anterolisthesis L4 on 5 without associated spondylolysis. Multilevel  Schmorl's node formations. The osseous structures appear diffusely demineralized which may limit detection of small or nondisplaced fractures. IMPRESSION: Persisting small bowel obstruction. Interval development of extensive pneumatosis throughout the distended loops, particularly in the proximal jejunum and bowel segments closer to a transition point in the right lateral abdomen. Extensive air within the draining mesentery and portal veins as well as portal venous gas within the liver. Findings are highly worrisome for developing bowel necrosis. Possible filling defect seen in the right lower lobar pulmonary artery though difficult to fully characterize given finding on the margins of imaging. Recommend dedicated CT PA for further characterization. Some patchy opacity in the right lung base may be related to the filling defect/developing infarct versus infection/inflammation or atelectasis. Circumferential thickening of the urinary bladder, possibly related to some slight underdistention though could correlate with urinalysis to exclude cystitis. Redemonstrated postsurgical changes of the anterior abdominal wall with left lower quadrant colostomy. Critical Value/emergent results were called by telephone at the time of interpretation on 09/20/2020 at 11:43 pm to provider JARED GARDNER , who verbally acknowledged these results. Electronically Signed   By: Lovena Le M.D.   On: 09/20/2020 23:39   DG Abd Portable 1V  Result Date: 09/21/2020 CLINICAL DATA:  NG tube placement EXAM: PORTABLE ABDOMEN - 1 VIEW COMPARISON:  Abdominal radiograph 09/20/2020 CT 09/20/2020 FINDINGS: Transesophageal tube tip and side port terminate in the left upper quadrant beyond the GE junction. Diffusely distended loops of bowel throughout the abdomen with regions of pneumatosis. No suspicious abdominal calcifications. Lung bases are clear. No acute osseous or soft tissue abnormality. Multilevel degenerative changes are present in the  imaged portions of the spine. Additional degenerative changes in the pelvis. IMPRESSION: Does not positioning of the transesophageal tube. Diffuse bowel distension compatible with persisting obstruction with pneumatosis as seen on recent CT. Electronically Signed   By: Lovena Le M.D.   On: 09/21/2020 06:29    Anti-infectives: Anti-infectives (From admission, onward)   Start     Dose/Rate Route Frequency Ordered Stop   09/21/20 0030  cefTRIAXone (ROCEPHIN) 2 g in sodium chloride 0.9 % 100 mL IVPB        2 g 200 mL/hr over 30 Minutes Intravenous Daily at bedtime 09/21/20 0012     09/21/20 0030  metroNIDAZOLE (FLAGYL) IVPB 500 mg        500 mg 100 mL/hr over 60 Minutes Intravenous Every 8 hours 09/21/20 0012     09/01/20 1930  ciprofloxacin (CIPRO) IVPB 400 mg        400 mg 200 mL/hr over 60 Minutes Intravenous Every 12 hours 09/01/20 1836 09/05/20 1927   09/01/20 1930  metroNIDAZOLE (FLAGYL) IVPB 500 mg        500 mg 100 mL/hr over 60 Minutes Intravenous Every 8 hours 09/01/20 1836 09/05/20 2124       Assessment/Plan Hx of HTN DM2 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  OSA ABL anemia- hgb8.9, stable Concern for possible PE - CTA chest per primary, on heparin gtt - above per [facility]-  Ms. Skalski is a 60 year old female with a history of GI bleed s/p angioembolization of a branch of the IMA on 4/13, who developed a necrotic section of proximal sigmoid colon with perforation and feculent peritonitis S/pExploratory Laparotomy, Partial Colectomy (sigmoid), colostomy  creation,Hartman's procedure on 4/16/2022by Dr. Grandville Silos - POD #18 -Ileushad resolved but CT ON with dilated bowel and pneumatosis - NGT reinserted and on LIWS - abdominal exam remains fairly benign, WBC up slightly at 11 but no lactic acidosis  - separation of stoma from 12-3 o'clock but remains functioning and above the fascia  -Completed4 days post opabxdue to feculent  peritonitis. Afebrile and wbc wnl (on chronic steroids). -Continue PT/OT, mobilize - recommending SNF - WOC following for ostomy education -Vac changes MWF - Pulm toilet  FEN -HH/CM diet  VTE -SCDs,heparin gtt ID -Cipro/Flagyl4/16- 4/20. None currently Foley - None  LOS: 23 days    Norm Parcel, [facility] Surgery 09/21/2020, 7:54 AM Please see Amion for pager number during day hours 7:00am-4:30pm

## 2020-09-21 NOTE — Plan of Care (Signed)
  Problem: Education: Goal: Knowledge of General Education information will improve Description: Including pain rating scale, medication(s)/side effects and non-pharmacologic comfort measures Outcome: Progressing   Problem: Health Behavior/Discharge Planning: Goal: Ability to manage health-related needs will improve Outcome: Progressing   Problem: Clinical Measurements: Goal: Ability to maintain clinical measurements within normal limits will improve Outcome: Progressing Goal: Will remain free from infection Outcome: Progressing Goal: Diagnostic test results will improve Outcome: Progressing Goal: Cardiovascular complication will be avoided Outcome: Progressing   Problem: Activity: Goal: Risk for activity intolerance will decrease Outcome: Progressing   Problem: Nutrition: Goal: Adequate nutrition will be maintained Outcome: Progressing   Problem: Coping: Goal: Level of anxiety will decrease Outcome: Progressing   Problem: Elimination: Goal: Will not experience complications related to urinary retention Outcome: Progressing   Problem: Pain Managment: Goal: General experience of comfort will improve Outcome: Progressing   Problem: Safety: Goal: Ability to remain free from injury will improve Outcome: Progressing   Problem: Skin Integrity: Goal: Risk for impaired skin integrity will decrease Outcome: Progressing   Problem: Education: Goal: Ability to identify signs and symptoms of gastrointestinal bleeding will improve Outcome: Progressing   Problem: Fluid Volume: Goal: Will show no signs and symptoms of excessive bleeding Outcome: Progressing   Problem: Clinical Measurements: Goal: Complications related to the disease process, condition or treatment will be avoided or minimized Outcome: Progressing

## 2020-09-21 NOTE — Progress Notes (Signed)
Notified earlier this evening of patient having an episode of emesis and plan for AXR. AXR notable for SBO, pneumatosis and portal venous gas. CT A/P performed based on this read, confirming the aforementioned findings. Despite this, the patient has a relatively benign abdominal exam and does not complain of abdominal pain currently. Her labs this morning were normal and she was tolerating a diet and having ostomy output. Ordinarily, these radiographic findings would warrant emergent surgical intervention, however this patient is ~3w s/p exlap and colostomy creation for feculent peritonitis and I believe the amount of adhesive disease anticipated as a result of this surgery would preclude identification or intervention of the presumed surgical problem and furthermore would create additional complications by virtue of unavoidable bowel injury. Recommend NPO, NGT, empiric abx, CBC, LA, and continued monitoring of abdominal exam. Also noted on imaging was suspicion for PE. Plan by primary team is for empiric initiation of heparin drip, recommend initiating without bolus.   Jesusita Oka, MD General and [facility]

## 2020-09-21 NOTE — Progress Notes (Signed)
ANTICOAGULATION CONSULT NOTE - Initial Consult  Pharmacy Consult for Heparin  Indication: pulmonary embolus  Allergies  Allergen Reactions  . Acetaminophen Other (See Comments)    Patient states tylenol makes her sick.     Patient Measurements: Height: 5\' 3"  (160 cm) Weight: 87.3 kg (192 lb 7.4 oz) IBW/kg (Calculated) : 52.4  Vital Signs: Temp: 98.2 F (36.8 C) (05/05 1945) Temp Source: Oral (05/05 1945) BP: 113/65 (05/05 2000) Pulse Rate: 100 (05/05 1945)  Labs: Recent Labs    09/18/20 0332 09/19/20 0401 09/20/20 0435  HGB 7.7* 7.6* 8.4*  HCT 26.4* 25.8* 28.9*  PLT 124* 147* 164  CREATININE  --  0.44  --     Estimated Creatinine Clearance: 79.4 mL/min (by C-G formula based on SCr of 0.44 mg/dL).   Medical History: Past Medical History:  Diagnosis Date  . Arthritis   . Degenerative disc disease, lumbar   . GERD (gastroesophageal reflux disease)   . Hypertension   . OSA (obstructive sleep apnea)    not currently on CPAP  . Prediabetes   . Sarcoidosis of central nervous system   . Seizures ([place])   . Type 2   . Urinary incontinence     Assessment: 60 y/o F almost 3 weeks s/p OR for perforated bowel. CT abdomen this PM is concerning for persistent obstruction and possible necrosis. CT abdomen also noted filling defect concerning for pulmonary embolism-would need dedicated study to confirm. Starting heparin cautiously with no bolus. Hgb is 8.4. Renal function ok.   Goal of Therapy:  Heparin level 0.3-0.5 units/ml (at risk for hemorrhagic necrosis per MD) Monitor platelets by anticoagulation protocol: Yes   Plan:  No bolus per MD Start heparin drip at 1000 units/hr 0900 Heparin level Daily CBC/Heparin level Monitor for bleeding  Narda Bonds, PharmD, [place] Pharmacist Phone: [phone]

## 2020-09-21 NOTE — Consult Note (Signed)
[place] Nurse wound follow up Patient receiving care in [place]. PA, Barkley Boards and Dr. Donne Hazel at bedside.  Wound type:surgical Measurement:Depth of wound 4.1cm  Wound CVE:LFYB Drainage (amount, consistency, odor) purulent serosanginous in canister Periwound:intact Dressing procedure/placement/frequency:two pieces of black foam removed.One piece of black foam packed into inferior aspect of the wound.One piece of narrow black foam placed into wound bed. Drape applied, immediate seal obtained. Supplies in drawer at bedside  [place] ostomy follow up Dr. Bobbye Morton present to assess stoma and mucocutaneous separation.  Stoma type/location:LUQ colostomy Stomal assessment/size:retracted, pink, oval shaped Measures 1 5/8" Oval Mucocutaneous separationfrom 6:00 to 12:00 Peristomal assessment:intact Treatment options for stomal/peristomal skin:barrier ring and convex pouch Output: formed dark brown stool, gas in pouch Ostomy pouching: 1pc.Flexible convex, [name] [phone] Education provided:none Enrolled patient in [facility] Discharge program: No Supplies at bedside.  [name], MSN, RN, [place], [name], [facility] Wound Treatment Associate Pager [phone]

## 2020-09-21 NOTE — Progress Notes (Signed)
Nutrition Follow-up  DOCUMENTATION CODES:   Not applicable  INTERVENTION:   -Resume TPN per pharmacy on 09/22/20 per discussion with general surgery PA -RD will follow for diet advancement and add supplements as appropriate  NUTRITION DIAGNOSIS:   Inadequate oral intake related to altered GI function as evidenced by other (comment) (NPO/Clear liquid diet).  Ongoing  GOAL:   Patient will meet greater than or equal to 90% of their needs  Unmet  MONITOR:   PO intake,Supplement acceptance,Diet advancement,Labs,Skin,I & O's  REASON FOR ASSESSMENT:   NPO/Clear Liquid Diet,Consult New TPN/TNA  ASSESSMENT:   60 yo female admitted with frank rectal bleeding and hypotension. 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  4/13 - S/P coil embolization of the distal [place] branch of the superior sigmoidal artery. 4/16 - Developed severe abdominal pain. CT showed visceral perforation. S/P emergent ex lap, found to have necrotic sigmoid colon. 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  5/4- d/c TPN 5/6- vomiting episode; s/p KUB- revealed SBO and possible bowel infarction; NPO; NGT replaced; connected to low, intermittent suction  Reviewed I/O's: -690 ml x 24 hours and -12 L since 09/07/20  UOP: 1 L x 24 hours  Drain output: 50 ml x 24 hours  Pt now NPO with NGT connected to low, intermittent suction.   Case discussed with general surgery PA Barkley Boards); plan to resume TPN tomorrow for nutritional support.   Medications reviewed and include colace, solu-medrol, and dextrose 5% in lactated ringers infusion @ 75 ml/hr.   Labs reviewed: CBGS: 90-101 (inpatient orders for glycemic control are 0-9 units insulin aspart every 4 hours).   Diet Order:   Diet Order            Diet NPO time specified Except for: Ice Chips, Sips with Meds  Diet effective now                  EDUCATION NEEDS:   Not appropriate for education at this time  Skin:  Skin Assessment: Skin Integrity Issues: Skin Integrity Issues:: Other (Comment),Incisions,Wound VAC Wound Vac: abdominal incision Incisions: R chest; abdomen Other: skin tear R thigh  Last BM:  09/20/20  Height:   Ht Readings from Last 1 Encounters:  08/30/20 5\' 3"  (1.6 m)    Weight:   Wt Readings from Last 1 Encounters:  09/21/20 88 kg    Ideal Body Weight:  52.3 kg  BMI:  Body mass index is 34.37 kg/m.  Estimated Nutritional Needs:   Kcal:  1900-2100  Protein:  110-125 gm  Fluid:  >/= 2 L    Loistine Chance, RD, LDN, [place] Registered Dietitian II Certified Diabetes Care and Education Specialist Please refer to [facility] for RD and/or RD on-call/weekend/after hours pager

## 2020-09-21 NOTE — Progress Notes (Signed)
ANTICOAGULATION CONSULT NOTE - Initial Consult  Pharmacy Consult for Heparin  Indication: pulmonary embolus  Allergies  Allergen Reactions  . Acetaminophen Other (See Comments)    Patient states tylenol makes her sick.     Patient Measurements: Height: 5\' 3"  (160 cm) Weight: 88 kg (194 lb 0.1 oz) IBW/kg (Calculated) : 52.4  Vital Signs: Temp: 97.9 F (36.6 C) (05/06 0800) Temp Source: Axillary (05/06 0800) BP: 112/65 (05/06 0800) Pulse Rate: 96 (05/06 1202)  Labs: Recent Labs    09/19/20 0401 09/20/20 0435 09/21/20 0242 09/21/20 1149  HGB 7.6* 8.4* 8.9*  --   HCT 25.8* 28.9* 30.3*  --   PLT 147* 164 202  --   HEPARINUNFRC  --   --   --  0.42  CREATININE 0.44  --  0.45  --     Estimated Creatinine Clearance: 79.6 mL/min (by C-G formula based on SCr of 0.45 mg/dL).   Medical History: Past Medical History:  Diagnosis Date  . Arthritis   . Degenerative disc disease, lumbar   . GERD (gastroesophageal reflux disease)   . Hypertension   . OSA (obstructive sleep apnea)    not currently on CPAP  . Prediabetes   . Sarcoidosis of central nervous system   . Seizures ([place])   . Type 2   . Urinary incontinence     Assessment: 60 y/o F almost 3 weeks s/p OR for perforated bowel. CT abdomen this PM is concerning for persistent obstruction and possible necrosis. CT abdomen also noted filling defect concerning for pulmonary embolism-would need dedicated study to confirm. Starting heparin cautiously with no bolus. Hgb is 8.4. Renal function ok.   Heparin came back therapeutic without bolus so we will continue with the same rate without checking a confirm level. CT>>positive for PE.   Goal of Therapy:  Heparin level 0.3-0.5 units/ml (at risk for hemorrhagic necrosis per MD) Monitor platelets by anticoagulation protocol: Yes   Plan:  No bolus per MD Cont heparin drip at 1000 units/hr Daily CBC/Heparin level Monitor for bleeding  Onnie Boer, PharmD, BCIDP, AAHIVP,  [facility] Infectious Disease Pharmacist 09/21/2020 12:26 PM

## 2020-09-21 NOTE — TOC Progression Note (Signed)
Transition of Care [facility]) - Progression Note    Patient Details  Name: Rhonda [name] MRN: [id number] Date of Birth: 02/15/1961  Transition of Care [facility]) CM/SW [place], LCSW Phone Number: 09/21/2020, 10:23 AM  Clinical Narrative:    [place] [facility] has Biochemist, clinical for patient, however she is not medically stable. CSW will check back on Monday. Patient will need updated COVID test for SNF.   Expected Discharge Plan: [place] Barriers to Discharge: Insurance Authorization,Continued Medical Work up  Expected Discharge Plan and Services Expected Discharge Plan: [place] In-house Referral: Clinical Social Work   Post Acute Care Choice: [facility] Living arrangements for the past 2 months: Single Family Home                                       Social Determinants of Health (SDOH) Interventions    Readmission Risk Interventions Readmission Risk Prevention Plan 09/12/2020 09/04/2020  Transportation Screening Complete Complete  Medication Review [employer]) - Referral to Pharmacy  PCP or Specialist appointment within 3-5 days of discharge Complete Complete  HRI or Home Care Consult Complete Complete  SW Recovery Care/Counseling Consult Complete Complete  Palliative Care Screening Not Applicable Not Applicable  Skilled Nursing Facility Complete Complete  Some recent data might be hidden

## 2020-09-21 NOTE — Progress Notes (Signed)
Bilateral lower extremity venous duplex has been completed. Preliminary results can be found in CV Proc through chart review.   09/21/20 1:30 PM Rhonda [name] RVT

## 2020-09-21 NOTE — Progress Notes (Signed)
PROGRESS NOTE    Rhonda [name]  [id number] DOB: [birth year] DOA: 08/29/2020 PCP: [place].   Brief Narrative: 60 year old with past medical history significant for hypertension, obesity, obstructive sleep apnea not using CPAP, neurosarcoidosis on chronic prednisone, wheelchair-bound due to chronic debility, recurrent GI bleeding with recent multiple hospitalization presented back on 4/13 to the emergency room with frank rectal bleeding and hypotension.  Significant events:   4/4-4/6 Admitted with hematochezia, symptomatically treated, capsule endoscopy and discharged home. 4/13 presented with hematochezia, CT angiogram with acute bleeding in the descending colon status post coil embolization of the distal [place] branch of the superior sigmoidal artery. Insulin infusion for high blood sugars.  Admitted to ICU. 4/15, transferred out of ICU. 4/16, afternoon after eating lunch, patient had severe abdominal pain.  Relieved with pain medication.  A CT scan showed visceral perforation.  Underwent urgent exploratory laparotomy and found to have necrotic sigmoid colon.  Status post Jeanette Caprice procedure with colostomy and wound VAC for midline incision. 4/22-5/2, prolonged ileus and not eating.  On TPN. 5/2-5/3, some return of bowel function.  Started noticing a stool in colostomy.  Tolerating liquids.  5/04: Advance to regular diet.  5/05 overnight patient develop  vomiting: KUB pneumatosis and portal venous gas consistent with bowel ischemia and or infarction.  CT abdomen and pelvis 5/05; persistent small bowel obstruction.  Interval development of extensive pneumatosis throughout the distended loops, particularly in the proximal jejunum and bowel segment closer to a transition point in the right lateral abdomen.  Extensive air within the draining mesentery and portal veins as well as portal venous gas within the liver finding are highly worrisome for developing bowel necrosis.  Possible  filling defect seen in the right lower pulmonary artery though difficult to truly characterize given the finding on margins of imaging recommended CT to rule out PE. -Patient was started on heparin drip. -Evaluated by surgery recommended n.p.o., NG tube and plan to monitor clinically.   Assessment & Plan:   Active Problems:   GI bleed   Hypotension   Rectal bleeding   AKI (acute kidney injury) ([place])   1-Perforated sigmoid colon with history of diverticulosis and multiple procedures.  IMA embolization 4/13.  Emergency exploratory laparotomy and Hartman's procedure 4/16:  -Postop patient developed prolonged ileus.  Function improving, colostomy with stool. -TPN to be weaned off 5/05 -Completed 4 days antibiotics post sx for feculent peritonitis. Back on ceftriaxone.  -Continue with Wound Vac.  -5/05 overnight patient develop  vomiting: KUB pneumatosis and portal venous gas consistent with bowel ischemia and or infarction.  CT abdomen and pelvis 5/05; persistent small bowel obstruction.  Interval development of extensive pneumatosis throughout the distended loops, particularly in the proximal jejunum and bowel segment closer to a transition point in the right lateral abdomen.  Extensive air within the draining mesentery and portal veins as well as portal venous gas within the liver finding are highly worrisome for developing bowel necrosis.  Possible filling defect seen in the right lower pulmonary artery though difficult to truly characterize given the finding on margins of imaging recommended CT to rule out PE. -Patient was started on heparin drip. -Evaluated by surgery recommended n.p.o., NG tube and plan to monitor clinically.     2-Acute lower GI bleeding, diverticular bleed.  Hemorrhagic shock secondary to acute GI bleeding and anemia of blood loss. -Hb  has been stable since surgery, 7.6 -iron studies normal.  -hb stable. Monitor on heparin.   3-PE; -CTA confirmed  posterior segment  left lower lobe PE and a large PE in the left lower lobe pulmonary artery. -Continue with heparin drip -Lower extremity negative for DVT  4-Type 2 Diabetes with hyperglycemia Holding lantus due to NPO status.    5-Neurosarcoidosis, chronic debility and wheelchair-bound status: -Continue with chronic prednisone 40 mg twice daily.  Received preop Solu-Medrol. -While she had persistent ileus she was on IV Solu-Medrol.  6-Obstructive Sleep Apnea: Declined CPAP 7-Hypochloremic, Hyponatremia: Resolved Hypomagnesemia, hypokalemia: Replaced.  8-sinus tachycardia PAC: Plan to increase metoprolol to 50 mg twice daily improved 9-Acute renal failure: Due to #1.  Improved normalized Monitor. Started on fluids, received contrast.   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  I have recommended to follow-up with her PCP as she may need a PET scan  Nutrition Problem: Inadequate oral intake Etiology: altered GI function    Signs/Symptoms: other (comment) (NPO/Clear liquid diet)    Interventions: Boost Breeze,TPN  Estimated body mass index is 34.37 kg/m as calculated from the following:   Height as of this encounter: 5\' 3"  (1.6 m).   Weight as of this encounter: 88 kg.   DVT prophylaxis: SCD Code Status: Full code Family Communication: care discussed with husband who was at bedside.  Disposition Plan:  Status is: Inpatient  Remains inpatient appropriate because:IV treatments appropriate due to intensity of illness or inability to take PO   Dispo: The patient is from: Home              Anticipated d/c is to: SNF              Patient currently is medically stable to d/c.   Difficult to place patient No        Consultants:   Sx  CCM  IR  Gastroenterologist   Procedures:   Embolization inferior mesenteric artery 4/13  Hartman's procedure 4/16.    Antimicrobials:    Subjective: She denies abdominal pain, has NG tube in place.  Ostomy with  stool in bag.  Objective: Vitals:   09/21/20 0420 09/21/20 0422 09/21/20 0800 09/21/20 1202  BP: 111/67  112/65   Pulse: 85  96 96  Resp: 18  18 18   Temp: 98.1 F (36.7 C)  97.9 F (36.6 C)   TempSrc: Oral  Axillary   SpO2: 100%     Weight:  88 kg    Height:        Intake/Output Summary (Last 24 hours) at 09/21/2020 1515 Last data filed at 09/21/2020 1228 Gross per 24 hour  Intake 1118.86 ml  Output 1050 ml  Net 68.86 ml   Filed Weights   09/19/20 0442 09/20/20 0500 09/21/20 0422  Weight: 91.7 kg 87.3 kg 88 kg    Examination:  General exam: Chronically ill appearing no acute distress Respiratory system: Clear  to auscultation Cardiovascular system: S 1, S 2 RRR Gastrointestinal system: BS present, soft, nt, wound vac in place, ostomy in place.  Central nervous system: alert Extremities: No edema    Data Reviewed: I have personally reviewed following labs and imaging studies  CBC: Recent Labs  Lab 09/16/20 0021 09/17/20 0445 09/18/20 0332 09/19/20 0401 09/20/20 0435 09/21/20 0242  WBC 6.8 6.7 8.4 7.7 8.7 11.0*  NEUTROABS 4.3 5.6 5.2 5.9 5.4  --   HGB 7.6* 7.5* 7.7* 7.6* 8.4* 8.9*  HCT 25.6* 25.5* 26.4* 25.8* 28.9* 30.3*  MCV 99.6 100.4* 100.0 99.2 99.7 98.7  PLT 103* 123* 124* 147* 164 202   Basic  Metabolic Panel: Recent Labs  Lab 09/15/20 0302 09/17/20 0445 09/19/20 0401 09/21/20 0242  NA 134* 140 140 133*  K 4.5 3.9 4.3 4.1  CL 101 102 103 95*  CO2 28 31 32 30  GLUCOSE 114* 104* 214* 143*  BUN 20 17 19 16   CREATININE 0.39* 0.40* 0.44 0.45  CALCIUM 8.1* 8.4* 8.5* 8.1*  MG  --  2.1 2.1  --   PHOS  --  3.5  --   --    GFR: Estimated Creatinine Clearance: 79.6 mL/min (by C-G formula based on SCr of 0.45 mg/dL). Liver Function Tests: Recent Labs  Lab 09/15/20 0302 09/17/20 0445 09/21/20 0242  AST 25 26 36  ALT 29 29 57*  ALKPHOS 133* 131* 201*  BILITOT 0.5 0.3 0.5  PROT 4.1* 4.5* 4.9*  ALBUMIN 1.9* 1.9* 2.0*   No results for input(s):  LIPASE, AMYLASE in the last 168 hours. No results for input(s): AMMONIA in the last 168 hours. Coagulation Profile: No results for input(s): INR, PROTIME in the last 168 hours. Cardiac Enzymes: No results for input(s): CKTOTAL, CKMB, CKMBINDEX, TROPONINI in the last 168 hours. BNP (last 3 results) No results for input(s): PROBNP in the last 8760 hours. HbA1C: No results for input(s): HGBA1C in the last 72 hours. CBG: Recent Labs  Lab 09/20/20 2014 09/21/20 0028 09/21/20 0425 09/21/20 0736 09/21/20 1200  GLUCAP 147* 175* 142* 101* 90   Lipid Profile: No results for input(s): CHOL, HDL, LDLCALC, TRIG, CHOLHDL, LDLDIRECT in the last 72 hours. Thyroid Function Tests: No results for input(s): TSH, T4TOTAL, FREET4, T3FREE, THYROIDAB in the last 72 hours. Anemia Panel: Recent Labs    09/20/20 0435  FERRITIN 193  TIBC 225*  IRON 49   Sepsis Labs: Recent Labs  Lab 09/20/20 2340 09/21/20 0242  LATICACIDVEN 1.3 1.2    No results found for this or any previous visit (from the past 240 hour(s)).       Radiology Studies: DG Abd 1 View  Addendum Date: 09/20/2020   ADDENDUM REPORT: 09/20/2020 22:07 ADDENDUM: These results were called by telephone at the time of interpretation on 09/20/2020 at 10:03 pm to provider Sheran Luz, MD , who verbally acknowledged these results. Electronically Signed   By: Fidela Salisbury MD   On: 09/20/2020 22:07   Result Date: 09/20/2020 CLINICAL DATA:  Nausea EXAM: ABDOMEN - 1 VIEW COMPARISON:  09/14/2020 FINDINGS: Multiple gas-filled markedly dilated loops of small bowel are seen throughout the abdomen. There is, additionally, gas-filled nondilated loops of small bowel seen within the pelvis as well as a small amount of gas seen within the ascending colon. Together, the findings suggest changes of a high-grade partial or developing complete mid small bowel obstruction. additionally, there is pneumatosis identified within the left mid abdomen involving a  dilated loop of small bowel as well as possible portal venous gas within the right upper quadrant possibly indicating bowel ischemia/infarction. No gross free intraperitoneal gas. No organomegaly. No acute bone abnormality. IMPRESSION: Findings in keeping with a high-grade partial or developing complete mid small bowel obstruction. Pneumatosis and portal venous gas in keeping with bowel ischemia and/or infarction. Dedicated contrast enhanced CT examination of the abdomen and pelvis is recommended, preferably without administration of oral contrast. Electronically Signed: By: Fidela Salisbury MD On: 09/20/2020 21:53   CT ANGIO CHEST PE W OR WO CONTRAST  Result Date: 09/21/2020 CLINICAL DATA:  Shortness of breath and elevated D-dimer EXAM: CT ANGIOGRAPHY CHEST WITH CONTRAST TECHNIQUE: Multidetector CT imaging  of the chest was performed using the standard protocol during bolus administration of intravenous contrast. Multiplanar CT image reconstructions and MIPs were obtained to evaluate the vascular anatomy. CONTRAST:  33mL OMNIPAQUE IOHEXOL 350 MG/ML SOLN COMPARISON:  Chest radiograph August 30, 2020; CT angiogram chest November 02, 2019. CT abdomen and pelvis Sep 20, 2020 FINDINGS: Cardiovascular: There is an incompletely obstructing pulmonary embolus in the proximal right lower lobe pulmonary artery. There is a pulmonary embolus in a peripheral branch of the posterior segment of the right lower lobe pulmonary artery. There is a pulmonary embolus in the posterior segment right lower lobe pulmonary artery as well. No appreciable right heart strain. There is left ventricular hypertrophy. There is no thoracic aortic aneurysm or dissection. Visualized great vessels appear normal. No pericardial effusion or pericardial thickening. Mediastinum/Nodes: There is a nodular focus measuring 7 mm in the left lobe of the thyroid which per consensus guidelines does not warrant additional imaging surveillance. Multiple subcentimeter  lymph nodes scattered throughout the mediastinum again noted. No frank adenopathy by size criteria evident. A nasogastric tube passes through the esophagus into the stomach. No esophageal lesions appreciable. Lungs/Pleura: There is a mass arising in the right lower lobe extending posterior to the left atrium along its rightward aspect measuring 3.2 x 2.1 cm. This mass was present previously and appears slightly larger at this time. No other parenchymal lung mass evident. There are scattered areas of mosaic attenuation without airspace consolidation. No pleural effusions. Trachea and major bronchial structures appear normal. No pneumothorax. Upper Abdomen: There is evidence of portal venous air, also present on abdominal CT 1 day prior. Visualized upper abdominal structures otherwise appear unremarkable. Musculoskeletal: Degenerative change noted in the thoracic spine. No blastic or lytic bone lesions. No chest wall lesions. Review of the MIP images confirms the above findings. IMPRESSION: 1. There are pulmonary emboli in each posterior segment left lower lobe pulmonary artery as well as a larger pulmonary embolus in the proximal left lower lobe pulmonary artery, incompletely obstructing. No appreciable right heart strain. 2. Portal venous air, seen 1 day prior as well. Concern for potential bowel ischemia given this finding. See report from CT abdomen and pelvis Sep 20, 2020 for further clarification. 3. There is a mass in the medial right lower lobe region extending posterior to the left atrium. This mass measures 3.2 x 2.1 cm and is slightly larger compared to June 2021 study. Etiology for this mass uncertain. It may be prudent to correlate with nuclear medicine PET study to assess for degree of metabolic activity. Neoplastic etiology for this lesion cannot be excluded. Nearby lymph nodes in this area do not meet size criteria for pathologic significance. 4. Multiple stable mediastinal lymph nodes which do not meet  size criteria for pathologic significance. Clinical significance of the multiplicity of these lymph nodes is uncertain. Nuclear medicine PET study to assess for metabolic activity in these lymph nodes could be helpful in this regard. 5. No edema or airspace consolidation. No pleural effusions. Somewhat mosaic attenuation in the lungs could indicate underlying small airways obstructive disease. 6.  Left ventricular hypertrophy. Critical Value/emergent results were called by telephone at the time of interpretation on 09/21/2020 at 12:14 pm to provider [facility] , who verbally acknowledged these results. Electronically Signed   By: Lowella Grip III M.D.   On: 09/21/2020 12:15   CT ABDOMEN PELVIS W CONTRAST  Result Date: 09/20/2020 CLINICAL DATA:  Bowel obstruction suspected EXAM: CT ABDOMEN AND PELVIS WITH CONTRAST TECHNIQUE:  Multidetector CT imaging of the abdomen and pelvis was performed using the standard protocol following bolus administration of intravenous contrast. CONTRAST:  111mL OMNIPAQUE IOHEXOL 300 MG/ML  SOLN COMPARISON:  Radiograph 09/20/2020, CT 09/09/2020 FINDINGS: Lower chest: Patchy opacity present in the right lung base, possibly atelectatic or early airspace disease. Questionable pulmonary embolism in the right lower lobar pulmonary though above the margins of imaging. Normal heart size. No pericardial effusion. Hepatobiliary: Normal hepatic attenuation. Smooth liver surface contour. Extensive branching lucencies in the anti dependent portions of the liver are in a distribution most suggestive of portal venous gas with more central part of venous gas in the left portal vein. Stable hypoattenuating focus adjacent the gallbladder fossa, possible focal fatty infiltration. No worrisome focal liver lesion. Gallbladder is unremarkable without visible calcified gallstones or biliary ductal dilatation. Pancreas: Stable hypoattenuating collection along the anterior head-body junction of the  pancreas (3/26) measuring 3.5 by 1 cm in maximum transaxial dimension. Additional small hypoattenuating focus towards the ventral aspect of the pancreatic head as well measuring up to 9 mm in size (3/30)., could reflect sequela of prior pancreatic inflammation without active inflammatory changes of the pancreas at this time. No pancreatic ductal dilatation or other concerning pancreatic lesions. Spleen: Normal in size. No concerning splenic lesions. Adrenals/Urinary Tract: Lobular thickening of the adrenal glands is similar to priors, no concerning focal adrenal lesion. Kidneys are normally located with symmetric enhancementand excretion. Subcentimeter hypoattenuating focus in the left kidney too small to fully characterize on CT imaging but statistically likely benign. No suspicious renal lesion, urolithiasis or hydronephrosis. Mild bladder wall thickening. Stomach/Bowel: Distal esophagus, stomach and duodenum are free of acute abnormality. Air and fluid distention of much of the proximal small bowel focal segment of borderline thickened jejunum in the left upper quadrant with extensive pneumatosis (3/48) dilatation both upstream and downstream of this segment with additional dilated small bowel in the right lower quadrant demonstrating pneumatosis as well (3/35) some focal thickening and transition noted in the right mid abdomen (5/81). This is in similar vicinity to the comparison study. More distal small bowel is less distended, partly decompressed. Normal appendix in the right lower quadrant. Pancolonic diverticulosis. Fluid-filled appearance of the colon may reflect some rapid transit state. A left lower quadrant end colostomy is noted. Colonic remnant is grossly unremarkable. Vascular/Lymphatic: Atherosclerotic calcifications within the abdominal aorta and branch vessels. No aneurysm or ectasia. Extensive intravenous gas noted within the draining mesentery veins, SM V and portal veins (3/38 for example) no  enlarged abdominopelvic lymph nodes. Reproductive: Uterus is surgically absent. No concerning adnexal lesions. Quiescent appearance of the ovarian tissue. Other: Postsurgical changes of the anterior ventral abdomen with overlying vacuum drain. Left lower quadrant ostomy. Lateral body wall edema, left greater than right. Musculoskeletal: Intramuscular lipoma in the left gluteal musculature. No suspicious intramuscular mass or lesion. Multilevel degenerative changes are present in the imaged portions of the spine. Grade 1 anterolisthesis L4 on 5 without associated spondylolysis. Multilevel Schmorl's node formations. The osseous structures appear diffusely demineralized which may limit detection of small or nondisplaced fractures. IMPRESSION: Persisting small bowel obstruction. Interval development of extensive pneumatosis throughout the distended loops, particularly in the proximal jejunum and bowel segments closer to a transition point in the right lateral abdomen. Extensive air within the draining mesentery and portal veins as well as portal venous gas within the liver. Findings are highly worrisome for developing bowel necrosis. Possible filling defect seen in the right lower lobar pulmonary artery though difficult to  fully characterize given finding on the margins of imaging. Recommend dedicated CT PA for further characterization. Some patchy opacity in the right lung base may be related to the filling defect/developing infarct versus infection/inflammation or atelectasis. Circumferential thickening of the urinary bladder, possibly related to some slight underdistention though could correlate with urinalysis to exclude cystitis. Redemonstrated postsurgical changes of the anterior abdominal wall with left lower quadrant colostomy. Critical Value/emergent results were called by telephone at the time of interpretation on 09/20/2020 at 11:43 pm to provider JARED GARDNER , who verbally acknowledged these results.  Electronically Signed   By: Lovena Le M.D.   On: 09/20/2020 23:39   DG Abd Portable 1V  Result Date: 09/21/2020 CLINICAL DATA:  NG tube placement EXAM: PORTABLE ABDOMEN - 1 VIEW COMPARISON:  Abdominal radiograph 09/20/2020 CT 09/20/2020 FINDINGS: Transesophageal tube tip and side port terminate in the left upper quadrant beyond the GE junction. Diffusely distended loops of bowel throughout the abdomen with regions of pneumatosis. No suspicious abdominal calcifications. Lung bases are clear. No acute osseous or soft tissue abnormality. Multilevel degenerative changes are present in the imaged portions of the spine. Additional degenerative changes in the pelvis. IMPRESSION: Does not positioning of the transesophageal tube. Diffuse bowel distension compatible with persisting obstruction with pneumatosis as seen on recent CT. Electronically Signed   By: Lovena Le M.D.   On: 09/21/2020 06:29   VAS [place] LOWER EXTREMITY VENOUS (DVT)  Result Date: 09/21/2020  Lower Venous DVT Study Patient Name:  Rhonda [name] [facility]  Date of Exam:   09/21/2020 Medical Rec #: [id number]       Accession #:    [id number] Date of Birth: [birth year]       Patient Gender: F Patient Age:   25Y Exam Location:  [facility] Procedure:      VAS [place] LOWER EXTREMITY VENOUS (DVT) Referring Phys: [id number] Fraidy Mccarrick A Pepe Mineau --------------------------------------------------------------------------------  Indications: Edema.  Risk Factors: None identified. Limitations: Body habitus and poor ultrasound/tissue interface. Comparison Study: No prior studies. Performing Technologist: Oliver Hum RVT  Examination Guidelines: A complete evaluation includes B-mode imaging, spectral Doppler, color Doppler, and power Doppler as needed of all accessible portions of each vessel. Bilateral testing is considered an integral part of a complete examination. Limited examinations for reoccurring indications may be performed as noted. The reflux portion of the exam is  performed with the patient in reverse Trendelenburg.  +---------+---------------+---------+-----------+----------+-------------------+ RIGHT    CompressibilityPhasicitySpontaneityPropertiesThrombus Aging      +---------+---------------+---------+-----------+----------+-------------------+ CFV      Full           Yes      Yes                                      +---------+---------------+---------+-----------+----------+-------------------+ SFJ      Full                                                             +---------+---------------+---------+-----------+----------+-------------------+ FV Prox  Full                                                             +---------+---------------+---------+-----------+----------+-------------------+  FV Mid   Full                                                             +---------+---------------+---------+-----------+----------+-------------------+ FV DistalFull                                                             +---------+---------------+---------+-----------+----------+-------------------+ PFV      Full                                                             +---------+---------------+---------+-----------+----------+-------------------+ POP      Full           Yes      Yes                                      +---------+---------------+---------+-----------+----------+-------------------+ PTV      Full                                                             +---------+---------------+---------+-----------+----------+-------------------+ PERO                                                  Not well visualized +---------+---------------+---------+-----------+----------+-------------------+   +---------+---------------+---------+-----------+----------+--------------+ LEFT     CompressibilityPhasicitySpontaneityPropertiesThrombus Aging  +---------+---------------+---------+-----------+----------+--------------+ CFV      Full           Yes      Yes                                 +---------+---------------+---------+-----------+----------+--------------+ SFJ      Full                                                        +---------+---------------+---------+-----------+----------+--------------+ FV Prox  Full                                                        +---------+---------------+---------+-----------+----------+--------------+ FV Mid   Full                                                        +---------+---------------+---------+-----------+----------+--------------+  FV DistalFull                                                        +---------+---------------+---------+-----------+----------+--------------+ PFV      Full                                                        +---------+---------------+---------+-----------+----------+--------------+ POP      Full           Yes      Yes                                 +---------+---------------+---------+-----------+----------+--------------+ PTV      Full                                                        +---------+---------------+---------+-----------+----------+--------------+ PERO     Full                                                        +---------+---------------+---------+-----------+----------+--------------+    Summary: RIGHT: - There is no evidence of deep vein thrombosis in the lower extremity. However, portions of this examination were limited- see technologist comments above.  - No cystic structure found in the popliteal fossa.  LEFT: - There is no evidence of deep vein thrombosis in the lower extremity. However, portions of this examination were limited- see technologist comments above.  - No cystic structure found in the popliteal fossa.  *See table(s) above for measurements and observations.     Preliminary         Scheduled Meds: . Chlorhexidine Gluconate Cloth  6 each Topical Daily  . docusate sodium  100 mg Oral BID  . gabapentin  100 mg Oral TID  . insulin aspart  0-9 Units Subcutaneous Q4H  . lidocaine  1 patch Transdermal Q24H  . mouth rinse  15 mL Mouth Rinse BID  . melatonin  9 mg Oral QHS  . methylPREDNISolone (SOLU-MEDROL) injection  40 mg Intravenous Q12H  . metoprolol tartrate  5 mg Intravenous Q6H  . nystatin  5 mL Oral QID  . pantoprazole (PROTONIX) IV  40 mg Intravenous QHS   Continuous Infusions: . cefTRIAXone (ROCEPHIN)  IV 2 g (09/21/20 0107)  . dextrose 5% lactated ringers 75 mL/hr at 09/21/20 1317  . heparin 1,000 Units/hr (09/21/20 0112)  . metronidazole Stopped (09/21/20 0952)     LOS: 23 days    Time spent: 35 minutes    Teancum Brule A Sonora Catlin, MD [facility]   If 7PM-7AM, please contact night-coverage [email or web address]  09/21/2020, 3:15 PM

## 2020-09-22 ENCOUNTER — Inpatient Hospital Stay (HOSPITAL_COMMUNITY): Payer: 59

## 2020-09-22 DIAGNOSIS — Z789 Other specified health status: Secondary | ICD-10-CM

## 2020-09-22 DIAGNOSIS — K921 Melena: Secondary | ICD-10-CM | POA: Diagnosis not present

## 2020-09-22 LAB — CBC WITH DIFFERENTIAL/PLATELET
Abs Immature Granulocytes: 1.16 10*3/uL — ABNORMAL HIGH (ref 0.00–0.07)
Basophils Absolute: 0.1 10*3/uL (ref 0.0–0.1)
Basophils Relative: 1 %
Eosinophils Absolute: 0 10*3/uL (ref 0.0–0.5)
Eosinophils Relative: 0 %
HCT: 29.5 % — ABNORMAL LOW (ref 36.0–46.0)
Hemoglobin: 8.8 g/dL — ABNORMAL LOW (ref 12.0–15.0)
Immature Granulocytes: 11 %
Lymphocytes Relative: 12 %
Lymphs Abs: 1.3 10*3/uL (ref 0.7–4.0)
MCH: 29.4 pg (ref 26.0–34.0)
MCHC: 29.8 g/dL — ABNORMAL LOW (ref 30.0–36.0)
MCV: 98.7 fL (ref 80.0–100.0)
Monocytes Absolute: 0.4 10*3/uL (ref 0.1–1.0)
Monocytes Relative: 4 %
Neutro Abs: 8.1 10*3/uL — ABNORMAL HIGH (ref 1.7–7.7)
Neutrophils Relative %: 72 %
Platelets: 214 10*3/uL (ref 150–400)
RBC: 2.99 MIL/uL — ABNORMAL LOW (ref 3.87–5.11)
RDW: 18.6 % — ABNORMAL HIGH (ref 11.5–15.5)
WBC: 11 10*3/uL — ABNORMAL HIGH (ref 4.0–10.5)
nRBC: 4.4 % — ABNORMAL HIGH (ref 0.0–0.2)

## 2020-09-22 LAB — LACTIC ACID, PLASMA: Lactic Acid, Venous: 0.8 mmol/L (ref 0.5–1.9)

## 2020-09-22 LAB — GLUCOSE, CAPILLARY
Glucose-Capillary: 102 mg/dL — ABNORMAL HIGH (ref 70–99)
Glucose-Capillary: 143 mg/dL — ABNORMAL HIGH (ref 70–99)
Glucose-Capillary: 152 mg/dL — ABNORMAL HIGH (ref 70–99)
Glucose-Capillary: 153 mg/dL — ABNORMAL HIGH (ref 70–99)
Glucose-Capillary: 154 mg/dL — ABNORMAL HIGH (ref 70–99)
Glucose-Capillary: 174 mg/dL — ABNORMAL HIGH (ref 70–99)

## 2020-09-22 LAB — COMPREHENSIVE METABOLIC PANEL
ALT: 45 U/L — ABNORMAL HIGH (ref 0–44)
AST: 24 U/L (ref 15–41)
Albumin: 2 g/dL — ABNORMAL LOW (ref 3.5–5.0)
Alkaline Phosphatase: 150 U/L — ABNORMAL HIGH (ref 38–126)
Anion gap: 8 (ref 5–15)
BUN: 11 mg/dL (ref 6–20)
CO2: 29 mmol/L (ref 22–32)
Calcium: 7.7 mg/dL — ABNORMAL LOW (ref 8.9–10.3)
Chloride: 97 mmol/L — ABNORMAL LOW (ref 98–111)
Creatinine, Ser: 0.49 mg/dL (ref 0.44–1.00)
GFR, Estimated: >60 mL/min (ref >60)
Glucose, Bld: 141 mg/dL — ABNORMAL HIGH (ref 70–99)
Potassium: 3.6 mmol/L (ref 3.5–5.1)
Sodium: 134 mmol/L — ABNORMAL LOW (ref 135–145)
Total Bilirubin: 0.4 mg/dL (ref 0.3–1.2)
Total Protein: 4.8 g/dL — ABNORMAL LOW (ref 6.5–8.1)

## 2020-09-22 LAB — TRIGLYCERIDES: Triglycerides: 83 mg/dL (ref <150)

## 2020-09-22 LAB — HEPARIN LEVEL (UNFRACTIONATED)
Heparin Unfractionated: 0.85 IU/mL — ABNORMAL HIGH (ref 0.30–0.70)
Heparin Unfractionated: 0.47 IU/mL (ref 0.30–0.70)

## 2020-09-22 LAB — MAGNESIUM: Magnesium: 1.8 mg/dL (ref 1.7–2.4)

## 2020-09-22 LAB — PREALBUMIN: Prealbumin: 24.3 mg/dL (ref 18–38)

## 2020-09-22 LAB — PHOSPHORUS: Phosphorus: 3.6 mg/dL (ref 2.5–4.6)

## 2020-09-22 MED ORDER — INSULIN ASPART 100 UNIT/ML IJ SOLN
0.0000 [IU] | INTRAMUSCULAR | Status: DC
  Administered 2020-09-22 (×2): 4 [IU] via SUBCUTANEOUS
  Administered 2020-09-22: 3 [IU] via SUBCUTANEOUS
  Administered 2020-09-23: 7 [IU] via SUBCUTANEOUS
  Administered 2020-09-23: 4 [IU] via SUBCUTANEOUS
  Administered 2020-09-23: 11 [IU] via SUBCUTANEOUS
  Administered 2020-09-23: 7 [IU] via SUBCUTANEOUS
  Administered 2020-09-23: 4 [IU] via SUBCUTANEOUS
  Administered 2020-09-23: 11 [IU] via SUBCUTANEOUS
  Administered 2020-09-23: 4 [IU] via SUBCUTANEOUS
  Administered 2020-09-24: 7 [IU] via SUBCUTANEOUS
  Administered 2020-09-24: 4 [IU] via SUBCUTANEOUS
  Administered 2020-09-24: 15 [IU] via SUBCUTANEOUS
  Administered 2020-09-24: 7 [IU] via SUBCUTANEOUS
  Administered 2020-09-24: 15 [IU] via SUBCUTANEOUS
  Administered 2020-09-25: 11 [IU] via SUBCUTANEOUS
  Administered 2020-09-25 (×3): 4 [IU] via SUBCUTANEOUS
  Administered 2020-09-25 (×2): 11 [IU] via SUBCUTANEOUS
  Administered 2020-09-26: 7 [IU] via SUBCUTANEOUS
  Administered 2020-09-26: 4 [IU] via SUBCUTANEOUS
  Administered 2020-09-26 (×3): 7 [IU] via SUBCUTANEOUS

## 2020-09-22 MED ORDER — POTASSIUM CHLORIDE 10 MEQ/50ML IV SOLN
10.0000 meq | INTRAVENOUS | Status: AC
  Administered 2020-09-22 (×2): 10 meq via INTRAVENOUS
  Filled 2020-09-22 (×2): qty 50

## 2020-09-22 MED ORDER — MAGNESIUM SULFATE 2 GM/50ML IV SOLN
2.0000 g | Freq: Once | INTRAVENOUS | Status: AC
  Administered 2020-09-22: 2 g via INTRAVENOUS
  Filled 2020-09-22: qty 50

## 2020-09-22 MED ORDER — LACTATED RINGERS IV SOLN: INTRAVENOUS | Status: DC

## 2020-09-22 MED ORDER — TRAVASOL 10 % IV SOLN
INTRAVENOUS | Status: AC
  Filled 2020-09-22: qty 599.4

## 2020-09-22 NOTE — Progress Notes (Signed)
21 Days Post-Op  Subjective: CC: Patient reports she is thirsty.  She denies any abdominal pain or nausea.  NG tube with minimal output overnight.  Having colostomy output.  Objective: Vital signs in last 24 hours: Temp:  [98.1 F (36.7 C)-98.3 F (36.8 C)] 98.1 F (36.7 C) (05/07 0732) Pulse Rate:  [76-96] 96 (05/07 0732) Resp:  [16-18] 17 (05/07 0732) BP: (105-124)/(68-77) 124/75 (05/07 0732) SpO2:  [98 %-100 %] 100 % (05/07 0732) Last BM Date: 09/22/20  Intake/Output from previous day: 05/06 0701 - 05/07 0700 In: 1580.5 [I.V.:1380.5; IV Piggyback:200] Out: 1350 [Urine:1200; Emesis/NG output:150] Intake/Output this shift: No intake/output data recorded.  PE: General: pleasant, WD,obesefemale who is laying in bed in NAD Heart:Reg Lungs: Respiratory effort nonlabored Abd: soft, minimally distended, NT,midline woundwith wound vac in place. Colostomy bag with stool in bag. Unable to visualize stoma 2/2 stool. +BS. NGT with ~100cc fluid in cannister.   Lab Results:  Recent Labs    09/21/20 0242 09/22/20 0346  WBC 11.0* 11.0*  HGB 8.9* 8.8*  HCT 30.3* 29.5*  PLT 202 214   BMET Recent Labs    09/21/20 0242 09/22/20 0346  NA 133* 134*  K 4.1 3.6  CL 95* 97*  CO2 30 29  GLUCOSE 143* 141*  BUN 16 11  CREATININE 0.45 0.49  CALCIUM 8.1* 7.7*   PT/INR No results for input(s): LABPROT, INR in the last 72 hours. CMP     Component Value Date/Time   NA 134 (L) 09/22/2020 0346   NA 129 (L) 08/06/2020 1003   K 3.6 09/22/2020 0346   CL 97 (L) 09/22/2020 0346   CO2 29 09/22/2020 0346   GLUCOSE 141 (H) 09/22/2020 0346   BUN 11 09/22/2020 0346   BUN 6 08/06/2020 1003   CREATININE 0.49 09/22/2020 0346   CREATININE 0.85 08/02/2015 1710   CALCIUM 7.7 (L) 09/22/2020 0346   PROT 4.8 (L) 09/22/2020 0346   PROT 4.9 (L) 08/06/2020 1003   ALBUMIN 2.0 (L) 09/22/2020 0346   ALBUMIN 3.2 (L) 08/06/2020 1003   AST 24 09/22/2020 0346   ALT 45 (H) 09/22/2020 0346    ALKPHOS 150 (H) 09/22/2020 0346   BILITOT 0.4 09/22/2020 0346   BILITOT 0.4 08/06/2020 1003   GFRNONAA >60 09/22/2020 0346   GFRNONAA 78 08/02/2015 1710   GFRAA >60 01/17/2020 0546   GFRAA >89 08/02/2015 1710   Lipase     Component Value Date/Time   LIPASE 64 (H) 07/23/2020 0432       Studies/Results: DG Abd 1 View  Addendum Date: 09/20/2020   ADDENDUM REPORT: 09/20/2020 22:07 ADDENDUM: These results were called by telephone at the time of interpretation on 09/20/2020 at 10:03 pm to provider Sheran Luz, MD , who verbally acknowledged these results. Electronically Signed   By: Fidela Salisbury MD   On: 09/20/2020 22:07   Result Date: 09/20/2020 CLINICAL DATA:  Nausea EXAM: ABDOMEN - 1 VIEW COMPARISON:  09/14/2020 FINDINGS: Multiple gas-filled markedly dilated loops of small bowel are seen throughout the abdomen. There is, additionally, gas-filled nondilated loops of small bowel seen within the pelvis as well as a small amount of gas seen within the ascending colon. Together, the findings suggest changes of a high-grade partial or developing complete mid small bowel obstruction. additionally, there is pneumatosis identified within the left mid abdomen involving a dilated loop of small bowel as well as possible portal venous gas within the right upper quadrant possibly indicating bowel ischemia/infarction. No gross free  intraperitoneal gas. No organomegaly. No acute bone abnormality. IMPRESSION: Findings in keeping with a high-grade partial or developing complete mid small bowel obstruction. Pneumatosis and portal venous gas in keeping with bowel ischemia and/or infarction. Dedicated contrast enhanced CT examination of the abdomen and pelvis is recommended, preferably without administration of oral contrast. Electronically Signed: By: Fidela Salisbury MD On: 09/20/2020 21:53   CT ANGIO CHEST PE W OR WO CONTRAST  Result Date: 09/21/2020 CLINICAL DATA:  Shortness of breath and elevated D-dimer EXAM: CT  ANGIOGRAPHY CHEST WITH CONTRAST TECHNIQUE: Multidetector CT imaging of the chest was performed using the standard protocol during bolus administration of intravenous contrast. Multiplanar CT image reconstructions and MIPs were obtained to evaluate the vascular anatomy. CONTRAST:  10mL OMNIPAQUE IOHEXOL 350 MG/ML SOLN COMPARISON:  Chest radiograph August 30, 2020; CT angiogram chest November 02, 2019. CT abdomen and pelvis Sep 20, 2020 FINDINGS: Cardiovascular: There is an incompletely obstructing pulmonary embolus in the proximal right lower lobe pulmonary artery. There is a pulmonary embolus in a peripheral branch of the posterior segment of the right lower lobe pulmonary artery. There is a pulmonary embolus in the posterior segment right lower lobe pulmonary artery as well. No appreciable right heart strain. There is left ventricular hypertrophy. There is no thoracic aortic aneurysm or dissection. Visualized great vessels appear normal. No pericardial effusion or pericardial thickening. Mediastinum/Nodes: There is a nodular focus measuring 7 mm in the left lobe of the thyroid which per consensus guidelines does not warrant additional imaging surveillance. Multiple subcentimeter lymph nodes scattered throughout the mediastinum again noted. No frank adenopathy by size criteria evident. A nasogastric tube passes through the esophagus into the stomach. No esophageal lesions appreciable. Lungs/Pleura: There is a mass arising in the right lower lobe extending posterior to the left atrium along its rightward aspect measuring 3.2 x 2.1 cm. This mass was present previously and appears slightly larger at this time. No other parenchymal lung mass evident. There are scattered areas of mosaic attenuation without airspace consolidation. No pleural effusions. Trachea and major bronchial structures appear normal. No pneumothorax. Upper Abdomen: There is evidence of portal venous air, also present on abdominal CT 1 day prior. Visualized  upper abdominal structures otherwise appear unremarkable. Musculoskeletal: Degenerative change noted in the thoracic spine. No blastic or lytic bone lesions. No chest wall lesions. Review of the MIP images confirms the above findings. IMPRESSION: 1. There are pulmonary emboli in each posterior segment left lower lobe pulmonary artery as well as a larger pulmonary embolus in the proximal left lower lobe pulmonary artery, incompletely obstructing. No appreciable right heart strain. 2. Portal venous air, seen 1 day prior as well. Concern for potential bowel ischemia given this finding. See report from CT abdomen and pelvis Sep 20, 2020 for further clarification. 3. There is a mass in the medial right lower lobe region extending posterior to the left atrium. This mass measures 3.2 x 2.1 cm and is slightly larger compared to June 2021 study. Etiology for this mass uncertain. It may be prudent to correlate with nuclear medicine PET study to assess for degree of metabolic activity. Neoplastic etiology for this lesion cannot be excluded. Nearby lymph nodes in this area do not meet size criteria for pathologic significance. 4. Multiple stable mediastinal lymph nodes which do not meet size criteria for pathologic significance. Clinical significance of the multiplicity of these lymph nodes is uncertain. Nuclear medicine PET study to assess for metabolic activity in these lymph nodes could be helpful in  this regard. 5. No edema or airspace consolidation. No pleural effusions. Somewhat mosaic attenuation in the lungs could indicate underlying small airways obstructive disease. 6.  Left ventricular hypertrophy. Critical Value/emergent results were called by telephone at the time of interpretation on 09/21/2020 at 12:14 pm to provider [facility] , who verbally acknowledged these results. Electronically Signed   By: Lowella Grip III M.D.   On: 09/21/2020 12:15   CT ABDOMEN PELVIS W CONTRAST  Result Date:  09/20/2020 CLINICAL DATA:  Bowel obstruction suspected EXAM: CT ABDOMEN AND PELVIS WITH CONTRAST TECHNIQUE: Multidetector CT imaging of the abdomen and pelvis was performed using the standard protocol following bolus administration of intravenous contrast. CONTRAST:  146mL OMNIPAQUE IOHEXOL 300 MG/ML  SOLN COMPARISON:  Radiograph 09/20/2020, CT 09/09/2020 FINDINGS: Lower chest: Patchy opacity present in the right lung base, possibly atelectatic or early airspace disease. Questionable pulmonary embolism in the right lower lobar pulmonary though above the margins of imaging. Normal heart size. No pericardial effusion. Hepatobiliary: Normal hepatic attenuation. Smooth liver surface contour. Extensive branching lucencies in the anti dependent portions of the liver are in a distribution most suggestive of portal venous gas with more central part of venous gas in the left portal vein. Stable hypoattenuating focus adjacent the gallbladder fossa, possible focal fatty infiltration. No worrisome focal liver lesion. Gallbladder is unremarkable without visible calcified gallstones or biliary ductal dilatation. Pancreas: Stable hypoattenuating collection along the anterior head-body junction of the pancreas (3/26) measuring 3.5 by 1 cm in maximum transaxial dimension. Additional small hypoattenuating focus towards the ventral aspect of the pancreatic head as well measuring up to 9 mm in size (3/30)., could reflect sequela of prior pancreatic inflammation without active inflammatory changes of the pancreas at this time. No pancreatic ductal dilatation or other concerning pancreatic lesions. Spleen: Normal in size. No concerning splenic lesions. Adrenals/Urinary Tract: Lobular thickening of the adrenal glands is similar to priors, no concerning focal adrenal lesion. Kidneys are normally located with symmetric enhancementand excretion. Subcentimeter hypoattenuating focus in the left kidney too small to fully characterize on CT  imaging but statistically likely benign. No suspicious renal lesion, urolithiasis or hydronephrosis. Mild bladder wall thickening. Stomach/Bowel: Distal esophagus, stomach and duodenum are free of acute abnormality. Air and fluid distention of much of the proximal small bowel focal segment of borderline thickened jejunum in the left upper quadrant with extensive pneumatosis (3/48) dilatation both upstream and downstream of this segment with additional dilated small bowel in the right lower quadrant demonstrating pneumatosis as well (3/35) some focal thickening and transition noted in the right mid abdomen (5/81). This is in similar vicinity to the comparison study. More distal small bowel is less distended, partly decompressed. Normal appendix in the right lower quadrant. Pancolonic diverticulosis. Fluid-filled appearance of the colon may reflect some rapid transit state. A left lower quadrant end colostomy is noted. Colonic remnant is grossly unremarkable. Vascular/Lymphatic: Atherosclerotic calcifications within the abdominal aorta and branch vessels. No aneurysm or ectasia. Extensive intravenous gas noted within the draining mesentery veins, SM V and portal veins (3/38 for example) no enlarged abdominopelvic lymph nodes. Reproductive: Uterus is surgically absent. No concerning adnexal lesions. Quiescent appearance of the ovarian tissue. Other: Postsurgical changes of the anterior ventral abdomen with overlying vacuum drain. Left lower quadrant ostomy. Lateral body wall edema, left greater than right. Musculoskeletal: Intramuscular lipoma in the left gluteal musculature. No suspicious intramuscular mass or lesion. Multilevel degenerative changes are present in the imaged portions of the spine. Grade 1 anterolisthesis L4  on 5 without associated spondylolysis. Multilevel Schmorl's node formations. The osseous structures appear diffusely demineralized which may limit detection of small or nondisplaced fractures.  IMPRESSION: Persisting small bowel obstruction. Interval development of extensive pneumatosis throughout the distended loops, particularly in the proximal jejunum and bowel segments closer to a transition point in the right lateral abdomen. Extensive air within the draining mesentery and portal veins as well as portal venous gas within the liver. Findings are highly worrisome for developing bowel necrosis. Possible filling defect seen in the right lower lobar pulmonary artery though difficult to fully characterize given finding on the margins of imaging. Recommend dedicated CT PA for further characterization. Some patchy opacity in the right lung base may be related to the filling defect/developing infarct versus infection/inflammation or atelectasis. Circumferential thickening of the urinary bladder, possibly related to some slight underdistention though could correlate with urinalysis to exclude cystitis. Redemonstrated postsurgical changes of the anterior abdominal wall with left lower quadrant colostomy. Critical Value/emergent results were called by telephone at the time of interpretation on 09/20/2020 at 11:43 pm to provider JARED GARDNER , who verbally acknowledged these results. Electronically Signed   By: Lovena Le M.D.   On: 09/20/2020 23:39   DG Abd Portable 1V  Result Date: 09/22/2020 CLINICAL DATA:  Follow-up small bowel obstruction. EXAM: PORTABLE ABDOMEN - 1 VIEW COMPARISON:  09/21/2020 FINDINGS: There has been a interval decrease in the degree of small-bowel dilation although significant dilation persists. Nasogastric tube is well positioned, tip in the distal stomach. IMPRESSION: 1. Decrease degree of small-bowel distention since the previous day's exam, although significant distension persists. Findings remain consistent with a partial small bowel obstruction. Electronically Signed   By: Lajean Manes M.D.   On: 09/22/2020 09:19   DG Abd Portable 1V  Result Date: 09/21/2020 CLINICAL DATA:   NG tube placement EXAM: PORTABLE ABDOMEN - 1 VIEW COMPARISON:  Abdominal radiograph 09/20/2020 CT 09/20/2020 FINDINGS: Transesophageal tube tip and side port terminate in the left upper quadrant beyond the GE junction. Diffusely distended loops of bowel throughout the abdomen with regions of pneumatosis. No suspicious abdominal calcifications. Lung bases are clear. No acute osseous or soft tissue abnormality. Multilevel degenerative changes are present in the imaged portions of the spine. Additional degenerative changes in the pelvis. IMPRESSION: Does not positioning of the transesophageal tube. Diffuse bowel distension compatible with persisting obstruction with pneumatosis as seen on recent CT. Electronically Signed   By: Lovena Le M.D.   On: 09/21/2020 06:29   VAS [place] LOWER EXTREMITY VENOUS (DVT)  Result Date: 09/21/2020  Lower Venous DVT Study Patient Name:  Rhonda [name] [facility]  Date of Exam:   09/21/2020 Medical Rec #: [id number]       Accession #:    [id number] Date of Birth: 04/16/1961       Patient Gender: F Patient Age:   54Y Exam Location:  [facility] Procedure:      VAS [place] LOWER EXTREMITY VENOUS (DVT) Referring Phys: [id number] BELKYS A REGALADO --------------------------------------------------------------------------------  Indications: Edema.  Risk Factors: None identified. Limitations: Body habitus and poor ultrasound/tissue interface. Comparison Study: No prior studies. Performing Technologist: Oliver Hum RVT  Examination Guidelines: A complete evaluation includes B-mode imaging, spectral Doppler, color Doppler, and power Doppler as needed of all accessible portions of each vessel. Bilateral testing is considered an integral part of a complete examination. Limited examinations for reoccurring indications may be performed as noted. The reflux portion of the exam is performed with the patient in reverse  Trendelenburg.   +---------+---------------+---------+-----------+----------+-------------------+ RIGHT    CompressibilityPhasicitySpontaneityPropertiesThrombus Aging      +---------+---------------+---------+-----------+----------+-------------------+ CFV      Full           Yes      Yes                                      +---------+---------------+---------+-----------+----------+-------------------+ SFJ      Full                                                             +---------+---------------+---------+-----------+----------+-------------------+ FV Prox  Full                                                             +---------+---------------+---------+-----------+----------+-------------------+ FV Mid   Full                                                             +---------+---------------+---------+-----------+----------+-------------------+ FV DistalFull                                                             +---------+---------------+---------+-----------+----------+-------------------+ PFV      Full                                                             +---------+---------------+---------+-----------+----------+-------------------+ POP      Full           Yes      Yes                                      +---------+---------------+---------+-----------+----------+-------------------+ PTV      Full                                                             +---------+---------------+---------+-----------+----------+-------------------+ PERO                                                  Not well visualized +---------+---------------+---------+-----------+----------+-------------------+   +---------+---------------+---------+-----------+----------+--------------+ LEFT  CompressibilityPhasicitySpontaneityPropertiesThrombus Aging +---------+---------------+---------+-----------+----------+--------------+ CFV      Full            Yes      Yes                                 +---------+---------------+---------+-----------+----------+--------------+ SFJ      Full                                                        +---------+---------------+---------+-----------+----------+--------------+ FV Prox  Full                                                        +---------+---------------+---------+-----------+----------+--------------+ FV Mid   Full                                                        +---------+---------------+---------+-----------+----------+--------------+ FV DistalFull                                                        +---------+---------------+---------+-----------+----------+--------------+ PFV      Full                                                        +---------+---------------+---------+-----------+----------+--------------+ POP      Full           Yes      Yes                                 +---------+---------------+---------+-----------+----------+--------------+ PTV      Full                                                        +---------+---------------+---------+-----------+----------+--------------+ PERO     Full                                                        +---------+---------------+---------+-----------+----------+--------------+     Summary: RIGHT: - There is no evidence of deep vein thrombosis in the lower extremity. However, portions of this examination were limited- see technologist comments above.  - No cystic structure found in the popliteal fossa.  LEFT: - There is no evidence of deep vein thrombosis in  the lower extremity. However, portions of this examination were limited- see technologist comments above.  - No cystic structure found in the popliteal fossa.  *See table(s) above for measurements and observations. Electronically signed by Ruta Hinds MD on 09/21/2020 at 5:35:10 PM.    Final      Anti-infectives: Anti-infectives (From admission, onward)   Start     Dose/Rate Route Frequency Ordered Stop   09/21/20 0030  cefTRIAXone (ROCEPHIN) 2 g in sodium chloride 0.9 % 100 mL IVPB        2 g 200 mL/hr over 30 Minutes Intravenous Daily at bedtime 09/21/20 0012     09/21/20 0030  metroNIDAZOLE (FLAGYL) IVPB 500 mg        500 mg 100 mL/hr over 60 Minutes Intravenous Every 8 hours 09/21/20 0012     09/01/20 1930  ciprofloxacin (CIPRO) IVPB 400 mg        400 mg 200 mL/hr over 60 Minutes Intravenous Every 12 hours 09/01/20 1836 09/05/20 1927   09/01/20 1930  metroNIDAZOLE (FLAGYL) IVPB 500 mg        500 mg 100 mL/hr over 60 Minutes Intravenous Every 8 hours 09/01/20 1836 09/05/20 2124       Assessment/Plan Hx of HTN DM2 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  OSA ABL anemia- hgb8.8, stable PE -  on heparin gtt - above per [facility]-  Rhonda [name] is a 60 year old female with a history of GI bleed s/p angioembolization of a branch of the IMA on 4/13, who developed a necrotic section of proximal sigmoid colon with perforation and feculent peritonitis S/pExploratory Laparotomy, Partial Colectomy (sigmoid), colostomy creation,Hartman's procedure on 4/16/2022by Dr. Grandville Silos - POD #19 -CT with dilated bowel and pneumatosis - NGT reinserted and on LIWS yesterday. On IV abx.  White blood cell stable at 11.  Lactic within normal limits.  Her pain has resolved and she is nontender on exam.  She is having colostomy output.  X-ray this morning with continued small bowel dilatation but improved.  Continue to monitor.  No indication for emergent surgical intervention.  Will do clamping trial today and progress her slowly.  If she does not get put on diet in near future may need TNA. - Per note yesterday separation of stoma from 12-3 o'clock but remains functioning and above the fascia. -Continue PT/OT, mobilize - recommending SNF - [facility] following  for ostomy education -Vac changes MWF - Pulm toilet  FEN -NPO, NGT clamped, IVF per [facility] VTE -SCDs,heparin gtt ID -Cipro/Flagyl4/16- 4/20, 5/6 >>  Foley - None   LOS: 24 days    Rhonda [name] , [facility] 09/22/2020, 9:40 AM Please see Amion for pager number during day hours 7:00am-4:30pm

## 2020-09-22 NOTE — Progress Notes (Signed)
PHARMACY - TOTAL PARENTERAL NUTRITION CONSULT NOTE  Indication: Prolonged ileus  Patient Measurements: Height: 5\' 3"  (160 cm) Weight: 88 kg (194 lb 0.1 oz) IBW/kg (Calculated) : 52.4 TPN AdjBW (KG): 59.2 Body mass index is 34.37 kg/m.  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 Developed ileus post op and has remained NPO. TPN started 4/21 - 5/4. Pharmacy consulted to resume TPN on 5/7.  Glucose / Insulin: A1c 11.8%, on Lantus 18/d PTA - CBGs 102-174 off TPN. Lantus d/c'd due to patient NPO. Utilized 3 units sSSI in last 24hrs (Previously CBGs trended up on TPN and IV steroids - required up to 135 units regular insulin in TPN + rSSI + Novolog 6 units Q4H while on TPN) Prednisone transitioned back to SM 40 q12h on 5/6 Noted on MIVF D5LR at 75 ml/hr Electrolytes: Na 134, K 3.6 (goal >/=4 with ileus), CL 97, Mag 1.8 (goal >/=2 with ileus); others WNL Renal: SCr <1 stable, BUN WNL Hepatic: ALT mildly elevated. AST / Tbili / TG WNL. Alk phos down to 150. Prealbumin WNL, albumin 2 Intake / Output; MIVF: UOP 1.6 ml/kg/hr, NGT output 150 ml/24hrs, drain output 50 ml/24hrs; MIVF: D5LR at 75 ml/hr GI Imaging: 4/24 CT: SBO, suspicious for adhesions, diverticulosis 5/5 CT abd/pelvis: persistent SBO, extensive pneumatosis throughout distended loops, extensive air within draining mesentery/portal veins, portal venous gas within liver, highly worrisome for developing bowel necrosis GI Surgeries / Procedures: 4/16 ex-lap with Hartman's procedure   Central access: PICC placed 09/06/20 TPN start date: 09/06/20 - 09/19/20; 09/22/20 >>  Nutritional Goals: (per RD recommendation on 5/6) 1900-2100 kCal, 110-125 g protein, > 2L fluid per day  TPN goal rate is 90 ml/hr - will provide 120g AA, 59g SMOF lipids, and 259g CHO for total of  1954 kCal, meeting 100% of patient needs  Current Nutrition:  TPN Back to NPO 5/6  Plan:  Restart TPN at 45 ml/hr. Plan to increase to goal rate tomorrow if patient tolerates. Electrolytes in TPN: resume lytes based on previous trends on TPN - Na 143mEq/L, K 87mEq/L, Ca 4 mEq/L, Mg 11mEq/L, Phos 25mmol/L. Cl:Ac 1:1 Give mag sulfate 2g IV x 1 + K runs x 2 Add standard MVI and trace elements to TPN Change SSI to resistant q4h now that patient back on IV steroids and TPN with CBGs previously uncontrolled. Add 15 units regular insulin to TPN bag (conservative to start but anticipate will need escalate dose rapidly once TPN resumes). Remove dextrose from MIVF (D5LR) and reduce rate to 30 ml/hr at 1800 when TPN bag starts F/u AM labs   Arturo Morton, PharmD, BCPS Please check AMION for all [facility] contact numbers Clinical Pharmacist 09/22/2020 8:53 AM

## 2020-09-22 NOTE — Plan of Care (Signed)
  Problem: Education: Goal: Knowledge of General Education information will improve Description: Including pain rating scale, medication(s)/side effects and non-pharmacologic comfort measures Outcome: Progressing   Problem: Health Behavior/Discharge Planning: Goal: Ability to manage health-related needs will improve Outcome: Progressing   Problem: Clinical Measurements: Goal: Ability to maintain clinical measurements within normal limits will improve Outcome: Progressing Goal: Will remain free from infection Outcome: Progressing Goal: Diagnostic test results will improve Outcome: Progressing Goal: Cardiovascular complication will be avoided Outcome: Progressing   Problem: Activity: Goal: Risk for activity intolerance will decrease Outcome: Progressing   Problem: Nutrition: Goal: Adequate nutrition will be maintained Outcome: Progressing   Problem: Coping: Goal: Level of anxiety will decrease Outcome: Progressing   Problem: Elimination: Goal: Will not experience complications related to urinary retention Outcome: Progressing   Problem: Pain Managment: Goal: General experience of comfort will improve Outcome: Progressing   Problem: Safety: Goal: Ability to remain free from injury will improve Outcome: Progressing   Problem: Skin Integrity: Goal: Risk for impaired skin integrity will decrease Outcome: Progressing   Problem: Bowel/Gastric: Goal: Will show no signs and symptoms of gastrointestinal bleeding Outcome: Progressing   Problem: Fluid Volume: Goal: Will show no signs and symptoms of excessive bleeding Outcome: Progressing   Problem: Clinical Measurements: Goal: Complications related to the disease process, condition or treatment will be avoided or minimized Outcome: Progressing   Problem: Education: Goal: Ability to identify signs and symptoms of gastrointestinal bleeding will improve Outcome: Progressing

## 2020-09-22 NOTE — Progress Notes (Signed)
[facility] for Heparin  Indication: pulmonary embolus  Allergies  Allergen Reactions  . Acetaminophen Other (See Comments)    Patient states tylenol makes her sick.     Patient Measurements: Height: 5\' 3"  (160 cm) Weight: 88 kg (194 lb 0.1 oz) IBW/kg (Calculated) : 52.4  Vital Signs: Temp: 98.3 F (36.8 C) (05/07 0400) Temp Source: Oral (05/07 0400) BP: 110/68 (05/07 0400) Pulse Rate: 76 (05/07 0400)  Labs: Recent Labs    09/20/20 0435 09/21/20 0242 09/21/20 1149 09/22/20 0346 09/22/20 0356  HGB 8.4* 8.9*  --  8.8*  --   HCT 28.9* 30.3*  --  29.5*  --   PLT 164 202  --  214  --   HEPARINUNFRC  --   --  0.42  --  0.85*  CREATININE  --  0.45  --   --   --     Estimated Creatinine Clearance: 79.6 mL/min (by C-G formula based on SCr of 0.45 mg/dL).  Assessment: 60 y.o. female with new PE for heparin   Goal of Therapy:  Heparin level 0.3-0.5 units/ml (at risk for hemorrhagic necrosis per MD) Monitor platelets by anticoagulation protocol: Yes   Plan:  Decrease Heparin 800 units/hr Check heparin level in 8 hours.   Phillis Knack, PharmD, BCPS  09/22/2020 4:52 AM

## 2020-09-22 NOTE — Progress Notes (Signed)
PROGRESS NOTE    Rhonda [name]  [id number] DOB: 04/09/1961 DOA: 08/29/2020 PCP: [place].   Brief Narrative: 60 year old with past medical history significant for hypertension, obesity, obstructive sleep apnea not using CPAP, neurosarcoidosis on chronic prednisone, wheelchair-bound due to chronic debility, recurrent GI bleeding with recent multiple hospitalization presented back on 4/13 to the emergency room with frank rectal bleeding and hypotension.  Significant events:   4/4-4/6 Admitted with hematochezia, symptomatically treated, capsule endoscopy and discharged home. 4/13 presented with hematochezia, CT angiogram with acute bleeding in the descending colon status post coil embolization of the distal [place] branch of the superior sigmoidal artery. Insulin infusion for high blood sugars.  Admitted to ICU. 4/15, transferred out of ICU. 4/16, afternoon after eating lunch, patient had severe abdominal pain.  Relieved with pain medication.  A CT scan showed visceral perforation.  Underwent urgent exploratory laparotomy and found to have necrotic sigmoid colon.  Status post Jeanette Caprice procedure with colostomy and wound VAC for midline incision. 4/22-5/2, prolonged ileus and not eating.  On TPN. 5/2-5/3, some return of bowel function.  Started noticing a stool in colostomy.  Tolerating liquids.  5/04: Advance to regular diet.  5/05 overnight patient develop  vomiting: KUB pneumatosis and portal venous gas consistent with bowel ischemia and or infarction.  CT abdomen and pelvis 5/05; persistent small bowel obstruction.  Interval development of extensive pneumatosis throughout the distended loops, particularly in the proximal jejunum and bowel segment closer to a transition point in the right lateral abdomen.  Extensive air within the draining mesentery and portal veins as well as portal venous gas within the liver finding are highly worrisome for developing bowel necrosis.  Possible  filling defect seen in the right lower pulmonary artery though difficult to truly characterize given the finding on margins of imaging recommended CT to rule out PE. -Patient was started on heparin drip. -Evaluated by surgery recommended n.p.o., NG tube and plan to monitor clinically.   Assessment & Plan:   Active Problems:   GI bleed   Hypotension   Rectal bleeding   AKI (acute kidney injury) ([place])   1-Perforated sigmoid colon with history of diverticulosis and multiple procedures.  IMA embolization 4/13.  Emergency exploratory laparotomy and Hartman's procedure 4/16:  -Postop patient developed prolonged ileus.  Function improving, colostomy with stool. -TPN to be weaned off 5/05 -Completed 4 days antibiotics post sx for feculent peritonitis. Back on ceftriaxone.  -Continue with Wound Vac.  -5/05 overnight patient develop  vomiting: KUB pneumatosis and portal venous gas consistent with bowel ischemia and or infarction.  CT abdomen and pelvis 5/05; persistent small bowel obstruction.  Interval development of extensive pneumatosis throughout the distended loops, particularly in the proximal jejunum and bowel segment closer to a transition point in the right lateral abdomen.  Extensive air within the draining mesentery and portal veins as well as portal venous gas within the liver finding are highly worrisome for developing bowel necrosis.  Possible filling defect seen in the right lower pulmonary artery though difficult to truly characterize given the finding on margins of imaging recommended CT to rule out PE. -Patient was started on heparin drip. -Evaluated by surgery recommended n.p.o., NG tube and plan to monitor clinically.     2-Acute lower GI bleeding, diverticular bleed.  Hemorrhagic shock secondary to acute GI bleeding and anemia of blood loss. -Hb  has been stable since surgery, 7.6 -iron studies normal.  -hb stable. Monitor on heparin.   3-PE; -CTA confirmed  posterior segment  left lower lobe PE and a large PE in the left lower lobe pulmonary artery. -Continue with heparin drip, monitor hgb -Lower extremity negative for DVT  4-Type 2 Diabetes with hyperglycemia Holding lantus due to NPO status.    5-Neurosarcoidosis, chronic debility and wheelchair-bound status: -Continue with chronic prednisone 40 mg twice daily.  Received preop Solu-Medrol. -While she had persistent ileus she was on IV Solu-Medrol.  6-Obstructive Sleep Apnea: Declined CPAP 7-Hypochloremic, Hyponatremia: Resolved Hypomagnesemia, hypokalemia: Replaced.  8-sinus tachycardia PAC: Plan to increase metoprolol to 50 mg twice daily improved 9-Acute renal failure: Due to #1.  Improved normalized Monitor. Started on fluids, received contrast.   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  I have recommended to follow-up with her PCP as she may need a PET scan  Nutrition Problem: Inadequate oral intake Etiology: altered GI function    Signs/Symptoms: other (comment) (NPO/Clear liquid diet)    Interventions: Boost Breeze,TPN  Estimated body mass index is 34.37 kg/m as calculated from the following:   Height as of this encounter: 5\' 3"  (1.6 m).   Weight as of this encounter: 88 kg.   DVT prophylaxis: SCD Code Status: Full code Family Communication: patient  Disposition Plan:  Status is: Inpatient  Remains inpatient appropriate because:IV treatments appropriate due to intensity of illness or inability to take PO   Dispo: The patient is from: Home              Anticipated d/c is to: SNF              Patient currently is medically stable to d/c.   Difficult to place patient No        Consultants:   Sx  CCM  IR  Gastroenterologist   Procedures:   Embolization inferior mesenteric artery 4/13  Hartman's procedure 4/16.    Antimicrobials:    Subjective: She denies abdominal pain, has NG tube in place.  Ostomy with stool in bag. Wound vac  attached to mid abdomen She is tolerating TPN   Objective: Vitals:   09/21/20 1600 09/21/20 1950 09/22/20 0400 09/22/20 0732  BP:  105/77 110/68 124/75  Pulse: 91 88 76 96  Resp: 16 18 18 17   Temp:  98.1 F (36.7 C) 98.3 F (36.8 C) 98.1 F (36.7 C)  TempSrc:  Oral Oral Oral  SpO2:  99% 98% 100%  Weight:      Height:        Intake/Output Summary (Last 24 hours) at 09/22/2020 1030 Last data filed at 09/22/2020 0943 Gross per 24 hour  Intake 693.69 ml  Output 1900 ml  Net -1206.31 ml   Filed Weights   09/19/20 0442 09/20/20 0500 09/21/20 0422  Weight: 91.7 kg 87.3 kg 88 kg    Examination:  General exam: Chronically ill appearing no acute distress Respiratory system: Clear  to auscultation Cardiovascular system: S 1, S 2 RRR Gastrointestinal system: BS present, soft, nt, wound vac in place, ostomy in place.  Central nervous system: alert Extremities: No edema    Data Reviewed: I have personally reviewed following labs and imaging studies  CBC: Recent Labs  Lab 09/17/20 0445 09/18/20 0332 09/19/20 0401 09/20/20 0435 09/21/20 0242 09/22/20 0346  WBC 6.7 8.4 7.7 8.7 11.0* 11.0*  NEUTROABS 5.6 5.2 5.9 5.4  --  8.1*  HGB 7.5* 7.7* 7.6* 8.4* 8.9* 8.8*  HCT 25.5* 26.4* 25.8* 28.9* 30.3* 29.5*  MCV 100.4* 100.0 99.2 99.7 98.7 98.7  PLT 123*  124* 147* 164 202 [id number]   Basic Metabolic Panel: Recent Labs  Lab 09/17/20 0445 09/19/20 0401 09/21/20 0242 09/22/20 0346  NA 140 140 133* 134*  K 3.9 4.3 4.1 3.6  CL 102 103 95* 97*  CO2 31 32 30 29  GLUCOSE 104* 214* 143* 141*  BUN 17 19 16 11   CREATININE 0.40* 0.44 0.45 0.49  CALCIUM 8.4* 8.5* 8.1* 7.7*  MG 2.1 2.1  --  1.8  PHOS 3.5  --   --  3.6   GFR: Estimated Creatinine Clearance: 79.6 mL/min (by C-G formula based on SCr of 0.49 mg/dL). Liver Function Tests: Recent Labs  Lab 09/17/20 0445 09/21/20 0242 09/22/20 0346  AST 26 36 24  ALT 29 57* 45*  ALKPHOS 131* 201* 150*  BILITOT 0.3 0.5 0.4  PROT  4.5* 4.9* 4.8*  ALBUMIN 1.9* 2.0* 2.0*   No results for input(s): LIPASE, AMYLASE in the last 168 hours. No results for input(s): AMMONIA in the last 168 hours. Coagulation Profile: No results for input(s): INR, PROTIME in the last 168 hours. Cardiac Enzymes: No results for input(s): CKTOTAL, CKMB, CKMBINDEX, TROPONINI in the last 168 hours. BNP (last 3 results) No results for input(s): PROBNP in the last 8760 hours. HbA1C: No results for input(s): HGBA1C in the last 72 hours. CBG: Recent Labs  Lab 09/21/20 1702 09/21/20 2003 09/22/20 0000 09/22/20 0518 09/22/20 0735  GLUCAP 123* 114* 102* 152* 174*   Lipid Profile: Recent Labs    09/22/20 0346  TRIG 83   Thyroid Function Tests: No results for input(s): TSH, T4TOTAL, FREET4, T3FREE, THYROIDAB in the last 72 hours. Anemia Panel: Recent Labs    09/20/20 0435  FERRITIN 193  TIBC 225*  IRON 49   Sepsis Labs: Recent Labs  Lab 09/20/20 2340 09/21/20 0242 09/22/20 0346  LATICACIDVEN 1.3 1.2 0.8    No results found for this or any previous visit (from the past 240 hour(s)).       Radiology Studies: DG Abd 1 View  Addendum Date: 09/20/2020   ADDENDUM REPORT: 09/20/2020 22:07 ADDENDUM: These results were called by telephone at the time of interpretation on 09/20/2020 at 10:03 pm to provider Sheran Luz, MD , who verbally acknowledged these results. Electronically Signed   By: Fidela Salisbury MD   On: 09/20/2020 22:07   Result Date: 09/20/2020 CLINICAL DATA:  Nausea EXAM: ABDOMEN - 1 VIEW COMPARISON:  09/14/2020 FINDINGS: Multiple gas-filled markedly dilated loops of small bowel are seen throughout the abdomen. There is, additionally, gas-filled nondilated loops of small bowel seen within the pelvis as well as a small amount of gas seen within the ascending colon. Together, the findings suggest changes of a high-grade partial or developing complete mid small bowel obstruction. additionally, there is pneumatosis identified  within the left mid abdomen involving a dilated loop of small bowel as well as possible portal venous gas within the right upper quadrant possibly indicating bowel ischemia/infarction. No gross free intraperitoneal gas. No organomegaly. No acute bone abnormality. IMPRESSION: Findings in keeping with a high-grade partial or developing complete mid small bowel obstruction. Pneumatosis and portal venous gas in keeping with bowel ischemia and/or infarction. Dedicated contrast enhanced CT examination of the abdomen and pelvis is recommended, preferably without administration of oral contrast. Electronically Signed: By: Fidela Salisbury MD On: 09/20/2020 21:53   CT ANGIO CHEST PE W OR WO CONTRAST  Result Date: 09/21/2020 CLINICAL DATA:  Shortness of breath and elevated D-dimer EXAM: CT ANGIOGRAPHY CHEST WITH CONTRAST  TECHNIQUE: Multidetector CT imaging of the chest was performed using the standard protocol during bolus administration of intravenous contrast. Multiplanar CT image reconstructions and MIPs were obtained to evaluate the vascular anatomy. CONTRAST:  8mL OMNIPAQUE IOHEXOL 350 MG/ML SOLN COMPARISON:  Chest radiograph August 30, 2020; CT angiogram chest November 02, 2019. CT abdomen and pelvis Sep 20, 2020 FINDINGS: Cardiovascular: There is an incompletely obstructing pulmonary embolus in the proximal right lower lobe pulmonary artery. There is a pulmonary embolus in a peripheral branch of the posterior segment of the right lower lobe pulmonary artery. There is a pulmonary embolus in the posterior segment right lower lobe pulmonary artery as well. No appreciable right heart strain. There is left ventricular hypertrophy. There is no thoracic aortic aneurysm or dissection. Visualized great vessels appear normal. No pericardial effusion or pericardial thickening. Mediastinum/Nodes: There is a nodular focus measuring 7 mm in the left lobe of the thyroid which per consensus guidelines does not warrant additional imaging  surveillance. Multiple subcentimeter lymph nodes scattered throughout the mediastinum again noted. No frank adenopathy by size criteria evident. A nasogastric tube passes through the esophagus into the stomach. No esophageal lesions appreciable. Lungs/Pleura: There is a mass arising in the right lower lobe extending posterior to the left atrium along its rightward aspect measuring 3.2 x 2.1 cm. This mass was present previously and appears slightly larger at this time. No other parenchymal lung mass evident. There are scattered areas of mosaic attenuation without airspace consolidation. No pleural effusions. Trachea and major bronchial structures appear normal. No pneumothorax. Upper Abdomen: There is evidence of portal venous air, also present on abdominal CT 1 day prior. Visualized upper abdominal structures otherwise appear unremarkable. Musculoskeletal: Degenerative change noted in the thoracic spine. No blastic or lytic bone lesions. No chest wall lesions. Review of the MIP images confirms the above findings. IMPRESSION: 1. There are pulmonary emboli in each posterior segment left lower lobe pulmonary artery as well as a larger pulmonary embolus in the proximal left lower lobe pulmonary artery, incompletely obstructing. No appreciable right heart strain. 2. Portal venous air, seen 1 day prior as well. Concern for potential bowel ischemia given this finding. See report from CT abdomen and pelvis Sep 20, 2020 for further clarification. 3. There is a mass in the medial right lower lobe region extending posterior to the left atrium. This mass measures 3.2 x 2.1 cm and is slightly larger compared to June 2021 study. Etiology for this mass uncertain. It may be prudent to correlate with nuclear medicine PET study to assess for degree of metabolic activity. Neoplastic etiology for this lesion cannot be excluded. Nearby lymph nodes in this area do not meet size criteria for pathologic significance. 4. Multiple stable  mediastinal lymph nodes which do not meet size criteria for pathologic significance. Clinical significance of the multiplicity of these lymph nodes is uncertain. Nuclear medicine PET study to assess for metabolic activity in these lymph nodes could be helpful in this regard. 5. No edema or airspace consolidation. No pleural effusions. Somewhat mosaic attenuation in the lungs could indicate underlying small airways obstructive disease. 6.  Left ventricular hypertrophy. Critical Value/emergent results were called by telephone at the time of interpretation on 09/21/2020 at 12:14 pm to provider [facility] , who verbally acknowledged these results. Electronically Signed   By: Bretta Bang III M.D.   On: 09/21/2020 12:15   CT ABDOMEN PELVIS W CONTRAST  Result Date: 09/20/2020 CLINICAL DATA:  Bowel obstruction suspected EXAM: CT ABDOMEN AND  PELVIS WITH CONTRAST TECHNIQUE: Multidetector CT imaging of the abdomen and pelvis was performed using the standard protocol following bolus administration of intravenous contrast. CONTRAST:  139mL OMNIPAQUE IOHEXOL 300 MG/ML  SOLN COMPARISON:  Radiograph 09/20/2020, CT 09/09/2020 FINDINGS: Lower chest: Patchy opacity present in the right lung base, possibly atelectatic or early airspace disease. Questionable pulmonary embolism in the right lower lobar pulmonary though above the margins of imaging. Normal heart size. No pericardial effusion. Hepatobiliary: Normal hepatic attenuation. Smooth liver surface contour. Extensive branching lucencies in the anti dependent portions of the liver are in a distribution most suggestive of portal venous gas with more central part of venous gas in the left portal vein. Stable hypoattenuating focus adjacent the gallbladder fossa, possible focal fatty infiltration. No worrisome focal liver lesion. Gallbladder is unremarkable without visible calcified gallstones or biliary ductal dilatation. Pancreas: Stable hypoattenuating collection along  the anterior head-body junction of the pancreas (3/26) measuring 3.5 by 1 cm in maximum transaxial dimension. Additional small hypoattenuating focus towards the ventral aspect of the pancreatic head as well measuring up to 9 mm in size (3/30)., could reflect sequela of prior pancreatic inflammation without active inflammatory changes of the pancreas at this time. No pancreatic ductal dilatation or other concerning pancreatic lesions. Spleen: Normal in size. No concerning splenic lesions. Adrenals/Urinary Tract: Lobular thickening of the adrenal glands is similar to priors, no concerning focal adrenal lesion. Kidneys are normally located with symmetric enhancementand excretion. Subcentimeter hypoattenuating focus in the left kidney too small to fully characterize on CT imaging but statistically likely benign. No suspicious renal lesion, urolithiasis or hydronephrosis. Mild bladder wall thickening. Stomach/Bowel: Distal esophagus, stomach and duodenum are free of acute abnormality. Air and fluid distention of much of the proximal small bowel focal segment of borderline thickened jejunum in the left upper quadrant with extensive pneumatosis (3/48) dilatation both upstream and downstream of this segment with additional dilated small bowel in the right lower quadrant demonstrating pneumatosis as well (3/35) some focal thickening and transition noted in the right mid abdomen (5/81). This is in similar vicinity to the comparison study. More distal small bowel is less distended, partly decompressed. Normal appendix in the right lower quadrant. Pancolonic diverticulosis. Fluid-filled appearance of the colon may reflect some rapid transit state. A left lower quadrant end colostomy is noted. Colonic remnant is grossly unremarkable. Vascular/Lymphatic: Atherosclerotic calcifications within the abdominal aorta and branch vessels. No aneurysm or ectasia. Extensive intravenous gas noted within the draining mesentery veins, SM V  and portal veins (3/38 for example) no enlarged abdominopelvic lymph nodes. Reproductive: Uterus is surgically absent. No concerning adnexal lesions. Quiescent appearance of the ovarian tissue. Other: Postsurgical changes of the anterior ventral abdomen with overlying vacuum drain. Left lower quadrant ostomy. Lateral body wall edema, left greater than right. Musculoskeletal: Intramuscular lipoma in the left gluteal musculature. No suspicious intramuscular mass or lesion. Multilevel degenerative changes are present in the imaged portions of the spine. Grade 1 anterolisthesis L4 on 5 without associated spondylolysis. Multilevel Schmorl's node formations. The osseous structures appear diffusely demineralized which may limit detection of small or nondisplaced fractures. IMPRESSION: Persisting small bowel obstruction. Interval development of extensive pneumatosis throughout the distended loops, particularly in the proximal jejunum and bowel segments closer to a transition point in the right lateral abdomen. Extensive air within the draining mesentery and portal veins as well as portal venous gas within the liver. Findings are highly worrisome for developing bowel necrosis. Possible filling defect seen in the right lower lobar pulmonary  artery though difficult to fully characterize given finding on the margins of imaging. Recommend dedicated CT PA for further characterization. Some patchy opacity in the right lung base may be related to the filling defect/developing infarct versus infection/inflammation or atelectasis. Circumferential thickening of the urinary bladder, possibly related to some slight underdistention though could correlate with urinalysis to exclude cystitis. Redemonstrated postsurgical changes of the anterior abdominal wall with left lower quadrant colostomy. Critical Value/emergent results were called by telephone at the time of interpretation on 09/20/2020 at 11:43 pm to provider JARED GARDNER , who  verbally acknowledged these results. Electronically Signed   By: Lovena Le M.D.   On: 09/20/2020 23:39   DG Abd Portable 1V  Result Date: 09/22/2020 CLINICAL DATA:  Follow-up small bowel obstruction. EXAM: PORTABLE ABDOMEN - 1 VIEW COMPARISON:  09/21/2020 FINDINGS: There has been a interval decrease in the degree of small-bowel dilation although significant dilation persists. Nasogastric tube is well positioned, tip in the distal stomach. IMPRESSION: 1. Decrease degree of small-bowel distention since the previous day's exam, although significant distension persists. Findings remain consistent with a partial small bowel obstruction. Electronically Signed   By: Lajean Manes M.D.   On: 09/22/2020 09:19   DG Abd Portable 1V  Result Date: 09/21/2020 CLINICAL DATA:  NG tube placement EXAM: PORTABLE ABDOMEN - 1 VIEW COMPARISON:  Abdominal radiograph 09/20/2020 CT 09/20/2020 FINDINGS: Transesophageal tube tip and side port terminate in the left upper quadrant beyond the GE junction. Diffusely distended loops of bowel throughout the abdomen with regions of pneumatosis. No suspicious abdominal calcifications. Lung bases are clear. No acute osseous or soft tissue abnormality. Multilevel degenerative changes are present in the imaged portions of the spine. Additional degenerative changes in the pelvis. IMPRESSION: Does not positioning of the transesophageal tube. Diffuse bowel distension compatible with persisting obstruction with pneumatosis as seen on recent CT. Electronically Signed   By: Lovena Le M.D.   On: 09/21/2020 06:29   VAS [place] LOWER EXTREMITY VENOUS (DVT)  Result Date: 09/21/2020  Lower Venous DVT Study Patient Name:  CHANNEL TEE [facility]  Date of Exam:   09/21/2020 Medical Rec #: [id number]       Accession #:    [id number] Date of Birth: 12/24/1960       Patient Gender: F Patient Age:   75Y Exam Location:  [facility] Procedure:      VAS [place] LOWER EXTREMITY VENOUS (DVT) Referring Phys: [id number] BELKYS A  REGALADO --------------------------------------------------------------------------------  Indications: Edema.  Risk Factors: None identified. Limitations: Body habitus and poor ultrasound/tissue interface. Comparison Study: No prior studies. Performing Technologist: Oliver Hum RVT  Examination Guidelines: A complete evaluation includes B-mode imaging, spectral Doppler, color Doppler, and power Doppler as needed of all accessible portions of each vessel. Bilateral testing is considered an integral part of a complete examination. Limited examinations for reoccurring indications may be performed as noted. The reflux portion of the exam is performed with the patient in reverse Trendelenburg.  +---------+---------------+---------+-----------+----------+-------------------+ RIGHT    CompressibilityPhasicitySpontaneityPropertiesThrombus Aging      +---------+---------------+---------+-----------+----------+-------------------+ CFV      Full           Yes      Yes                                      +---------+---------------+---------+-----------+----------+-------------------+ SFJ      Full                                                             +---------+---------------+---------+-----------+----------+-------------------+  FV Prox  Full                                                             +---------+---------------+---------+-----------+----------+-------------------+ FV Mid   Full                                                             +---------+---------------+---------+-----------+----------+-------------------+ FV DistalFull                                                             +---------+---------------+---------+-----------+----------+-------------------+ PFV      Full                                                             +---------+---------------+---------+-----------+----------+-------------------+ POP      Full           Yes       Yes                                      +---------+---------------+---------+-----------+----------+-------------------+ PTV      Full                                                             +---------+---------------+---------+-----------+----------+-------------------+ PERO                                                  Not well visualized +---------+---------------+---------+-----------+----------+-------------------+   +---------+---------------+---------+-----------+----------+--------------+ LEFT     CompressibilityPhasicitySpontaneityPropertiesThrombus Aging +---------+---------------+---------+-----------+----------+--------------+ CFV      Full           Yes      Yes                                 +---------+---------------+---------+-----------+----------+--------------+ SFJ      Full                                                        +---------+---------------+---------+-----------+----------+--------------+ FV Prox  Full                                                        +---------+---------------+---------+-----------+----------+--------------+  FV Mid   Full                                                        +---------+---------------+---------+-----------+----------+--------------+ FV DistalFull                                                        +---------+---------------+---------+-----------+----------+--------------+ PFV      Full                                                        +---------+---------------+---------+-----------+----------+--------------+ POP      Full           Yes      Yes                                 +---------+---------------+---------+-----------+----------+--------------+ PTV      Full                                                        +---------+---------------+---------+-----------+----------+--------------+ PERO     Full                                                         +---------+---------------+---------+-----------+----------+--------------+     Summary: RIGHT: - There is no evidence of deep vein thrombosis in the lower extremity. However, portions of this examination were limited- see technologist comments above.  - No cystic structure found in the popliteal fossa.  LEFT: - There is no evidence of deep vein thrombosis in the lower extremity. However, portions of this examination were limited- see technologist comments above.  - No cystic structure found in the popliteal fossa.  *See table(s) above for measurements and observations. Electronically signed by Ruta Hinds MD on 09/21/2020 at 5:35:10 PM.    Final         Scheduled Meds: . Chlorhexidine Gluconate Cloth  6 each Topical Daily  . docusate sodium  100 mg Oral BID  . gabapentin  100 mg Oral TID  . insulin aspart  0-20 Units Subcutaneous Q4H  . lidocaine  1 patch Transdermal Q24H  . mouth rinse  15 mL Mouth Rinse BID  . melatonin  9 mg Oral QHS  . methylPREDNISolone (SOLU-MEDROL) injection  40 mg Intravenous Q12H  . metoprolol tartrate  5 mg Intravenous Q6H  . nystatin  5 mL Oral QID  . pantoprazole (PROTONIX) IV  40 mg Intravenous QHS   Continuous Infusions: . cefTRIAXone (ROCEPHIN)  IV 2 g (09/21/20 2214)  . dextrose 5% lactated ringers 75 mL/hr at 09/22/20 0606  . heparin 800  Units/hr (09/22/20 0458)  . lactated ringers    . magnesium sulfate bolus IVPB 2 g (09/22/20 1000)  . metronidazole 500 mg (09/22/20 0806)  . potassium chloride    . TPN ADULT (ION)       LOS: 24 days    Time spent: 25 minutes    Florencia Reasons, MD PhD FACP [facility]   If 7PM-7AM, please contact night-coverage [email or web address]  09/22/2020, 10:30 AM

## 2020-09-22 NOTE — Progress Notes (Signed)
[place] for Heparin  Indication: pulmonary embolus  Allergies  Allergen Reactions  . Acetaminophen Other (See Comments)    Patient states tylenol makes her sick.     Patient Measurements: Height: 5\' 3"  (160 cm) Weight: 88 kg (194 lb 0.1 oz) IBW/kg (Calculated) : 52.4  Vital Signs: Temp: 98.6 F (37 C) (05/07 1157) Temp Source: Oral (05/07 1157) BP: 117/81 (05/07 1157) Pulse Rate: 81 (05/07 1157)  Labs: Recent Labs    09/20/20 0435 09/21/20 0242 09/21/20 1149 09/22/20 0346 09/22/20 0356 09/22/20 1217  HGB 8.4* 8.9*  --  8.8*  --   --   HCT 28.9* 30.3*  --  29.5*  --   --   PLT 164 202  --  214  --   --   HEPARINUNFRC  --   --  0.42  --  0.85* 0.47  CREATININE  --  0.45  --  0.49  --   --     Estimated Creatinine Clearance: 79.6 mL/min (by C-G formula based on SCr of 0.49 mg/dL).  Assessment: 60 y.o. female with new PE for heparin. Repeat heparin level within goal range at 0.47. No signs of bleed and CBC stable.   Goal of Therapy:  Heparin level 0.3-0.5 units/ml (at risk for hemorrhagic necrosis per MD) Monitor platelets by anticoagulation protocol: Yes   Plan:  Continue Heparin 800 units/hr Check AM HL  Monitor S/sx of bleed  Cephus Slater, PharmD, [facility] Resident [phone] 09/22/2020 3:19 PM

## 2020-09-23 DIAGNOSIS — K625 Hemorrhage of anus and rectum: Secondary | ICD-10-CM | POA: Diagnosis not present

## 2020-09-23 LAB — CBC WITH DIFFERENTIAL/PLATELET
Abs Immature Granulocytes: 1.1 10*3/uL — ABNORMAL HIGH (ref 0.00–0.07)
Basophils Absolute: 0 10*3/uL (ref 0.0–0.1)
Basophils Relative: 0 %
Eosinophils Absolute: 0 10*3/uL (ref 0.0–0.5)
Eosinophils Relative: 0 %
HCT: 28.9 % — ABNORMAL LOW (ref 36.0–46.0)
Hemoglobin: 8.6 g/dL — ABNORMAL LOW (ref 12.0–15.0)
Immature Granulocytes: 11 %
Lymphocytes Relative: 10 %
Lymphs Abs: 1 10*3/uL (ref 0.7–4.0)
MCH: 29.2 pg (ref 26.0–34.0)
MCHC: 29.8 g/dL — ABNORMAL LOW (ref 30.0–36.0)
MCV: 98 fL (ref 80.0–100.0)
Monocytes Absolute: 0.4 10*3/uL (ref 0.1–1.0)
Monocytes Relative: 4 %
Neutro Abs: 7.6 10*3/uL (ref 1.7–7.7)
Neutrophils Relative %: 75 %
Platelets: 234 10*3/uL (ref 150–400)
RBC: 2.95 MIL/uL — ABNORMAL LOW (ref 3.87–5.11)
RDW: 18.6 % — ABNORMAL HIGH (ref 11.5–15.5)
WBC: 10.1 10*3/uL (ref 4.0–10.5)
nRBC: 4.2 % — ABNORMAL HIGH (ref 0.0–0.2)

## 2020-09-23 LAB — GLUCOSE, CAPILLARY
Glucose-Capillary: 159 mg/dL — ABNORMAL HIGH (ref 70–99)
Glucose-Capillary: 165 mg/dL — ABNORMAL HIGH (ref 70–99)
Glucose-Capillary: 180 mg/dL — ABNORMAL HIGH (ref 70–99)
Glucose-Capillary: 220 mg/dL — ABNORMAL HIGH (ref 70–99)
Glucose-Capillary: 221 mg/dL — ABNORMAL HIGH (ref 70–99)
Glucose-Capillary: 259 mg/dL — ABNORMAL HIGH (ref 70–99)
Glucose-Capillary: 260 mg/dL — ABNORMAL HIGH (ref 70–99)

## 2020-09-23 LAB — BASIC METABOLIC PANEL
Anion gap: 3 — ABNORMAL LOW (ref 5–15)
BUN: 9 mg/dL (ref 6–20)
CO2: 31 mmol/L (ref 22–32)
Calcium: 7.9 mg/dL — ABNORMAL LOW (ref 8.9–10.3)
Chloride: 101 mmol/L (ref 98–111)
Creatinine, Ser: 0.42 mg/dL — ABNORMAL LOW (ref 0.44–1.00)
GFR, Estimated: >60 mL/min (ref >60)
Glucose, Bld: 169 mg/dL — ABNORMAL HIGH (ref 70–99)
Potassium: 3.5 mmol/L (ref 3.5–5.1)
Sodium: 135 mmol/L (ref 135–145)

## 2020-09-23 LAB — HEPARIN LEVEL (UNFRACTIONATED): Heparin Unfractionated: 0.42 IU/mL (ref 0.30–0.70)

## 2020-09-23 LAB — MAGNESIUM: Magnesium: 2.1 mg/dL (ref 1.7–2.4)

## 2020-09-23 LAB — PHOSPHORUS: Phosphorus: 2.5 mg/dL (ref 2.5–4.6)

## 2020-09-23 MED ORDER — POTASSIUM CHLORIDE 10 MEQ/50ML IV SOLN
10.0000 meq | INTRAVENOUS | Status: AC
  Administered 2020-09-23 (×4): 10 meq via INTRAVENOUS
  Filled 2020-09-23 (×5): qty 50

## 2020-09-23 MED ORDER — TRAVASOL 10 % IV SOLN
INTRAVENOUS | Status: AC
  Filled 2020-09-23: qty 599.4

## 2020-09-23 NOTE — Plan of Care (Signed)
  Problem: Education: Goal: Knowledge of General Education information will improve Description: Including pain rating scale, medication(s)/side effects and non-pharmacologic comfort measures Outcome: Progressing   Problem: Health Behavior/Discharge Planning: Goal: Ability to manage health-related needs will improve Outcome: Progressing   Problem: Clinical Measurements: Goal: Ability to maintain clinical measurements within normal limits will improve Outcome: Progressing Goal: Will remain free from infection Outcome: Progressing Goal: Diagnostic test results will improve Outcome: Progressing Goal: Cardiovascular complication will be avoided Outcome: Progressing   Problem: Activity: Goal: Risk for activity intolerance will decrease Outcome: Progressing   Problem: Nutrition: Goal: Adequate nutrition will be maintained Outcome: Progressing   Problem: Coping: Goal: Level of anxiety will decrease Outcome: Progressing   Problem: Elimination: Goal: Will not experience complications related to urinary retention Outcome: Progressing   Problem: Pain Managment: Goal: General experience of comfort will improve Outcome: Progressing   Problem: Safety: Goal: Ability to remain free from injury will improve Outcome: Progressing   Problem: Skin Integrity: Goal: Risk for impaired skin integrity will decrease Outcome: Progressing   Problem: Education: Goal: Ability to identify signs and symptoms of gastrointestinal bleeding will improve Outcome: Progressing   Problem: Bowel/Gastric: Goal: Will show no signs and symptoms of gastrointestinal bleeding Outcome: Progressing   Problem: Fluid Volume: Goal: Will show no signs and symptoms of excessive bleeding Outcome: Progressing   Problem: Clinical Measurements: Goal: Complications related to the disease process, condition or treatment will be avoided or minimized Outcome: Progressing   

## 2020-09-23 NOTE — Progress Notes (Signed)
[place] for Heparin  Indication: pulmonary embolus  Allergies  Allergen Reactions  . Acetaminophen Other (See Comments)    Patient states tylenol makes her sick.     Patient Measurements: Height: 5\' 3"  (160 cm) Weight: 81.9 kg (180 lb 8.9 oz) IBW/kg (Calculated) : 52.4  Vital Signs: Temp: 98.5 F (36.9 C) (05/08 0424) Temp Source: Oral (05/08 0424) BP: 140/84 (05/08 0424) Pulse Rate: 79 (05/08 0424)  Labs: Recent Labs    09/21/20 0242 09/21/20 1149 09/22/20 0346 09/22/20 0356 09/22/20 1217 09/23/20 0511  HGB 8.9*  --  8.8*  --   --  8.6*  HCT 30.3*  --  29.5*  --   --  28.9*  PLT 202  --  214  --   --  234  HEPARINUNFRC  --    < >  --  0.85* 0.47 0.42  CREATININE 0.45  --  0.49  --   --  0.42*   < > = values in this interval not displayed.    Estimated Creatinine Clearance: 76.7 mL/min (A) (by C-G formula based on SCr of 0.42 mg/dL (L)).  Assessment: 60 y.o. female with new PE for heparin. Daily heparin level within goal range at 0.42. No signs of bleed and CBC stable. Will need to monitor physician plans for long-term treatment.   Goal of Therapy:  Heparin level 0.3-0.5 units/ml (at risk for hemorrhagic necrosis per MD) Monitor platelets by anticoagulation protocol: Yes   Plan:  Continue Heparin 800 units/hr Check AM HL  Monitor S/sx of bleed  Cephus Slater, PharmD, [facility] Pharm[facility] Resident [phone] 09/23/2020 7:09 AM

## 2020-09-23 NOTE — Progress Notes (Signed)
PHARMACY - TOTAL PARENTERAL NUTRITION CONSULT NOTE  Indication: Prolonged ileus  Patient Measurements: Height: 5\' 3"  (160 cm) Weight: 81.9 kg (180 lb 8.9 oz) IBW/kg (Calculated) : 52.4 TPN AdjBW (KG): 59.2 Body mass index is 31.98 kg/m.  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 Developed ileus post op and has remained NPO. TPN started 4/21 - 5/4. Pharmacy consulted to resume TPN on 5/7.  Glucose / Insulin: A1c 11.8%, on Lantus 18/d PTA, added back when off TPN and then d/c'd due to patient NPO and CBGs trending low CBGs 143-180 back on TPN. Utilized 21 units SSI in last 24hrs + 15 units regular insulin TPN bag Prednisone transitioned back to SM 40 q12h on 5/6 *Previously CBGs trended up on TPN and IV steroids - required up to 135 units regular insulin in TPN + rSSI + Novolog 6 units Q4H while on TPN* Electrolytes: K 3.6>3.5 (s/p K runs x 2 yesterday; goal >/=4 with ileus), Mag up to 2.1 (s/p Mag sulfate 2g IV x 1 yesterday; goal >/=2 with ileus); others WNL Renal: SCr <1 stable, BUN WNL Hepatic: ALT mildly elevated. AST / Tbili / TG WNL. Alk phos down to 150. Prealbumin WNL, albumin 2 Intake / Output; MIVF: UOP 1.4 ml/kg/hr; having colostomy output; net -12.8L, MIVF: LR at 30 ml/hr GI Imaging: 4/24 CT: SBO, suspicious for adhesions, diverticulosis 5/5 CT abd/pelvis: persistent SBO, extensive pneumatosis throughout distended loops, extensive air within draining mesentery/portal veins, portal venous gas within liver, highly worrisome for developing bowel necrosis GI Surgeries / Procedures: 4/16 ex-lap with Hartman's procedure   Central access: PICC placed 09/06/20 TPN start date: 09/06/20 - 09/19/20; 09/22/20 >>  Nutritional Goals: (per RD recommendation on 5/6) 1900-2100 kCal, 110-125 g protein, > 2L fluid per  day  TPN goal rate is 90 ml/hr - will provide 120g AA, 59g SMOF lipids, and 259g CHO for total of 1954 kCal, meeting 100% of patient needs  Current Nutrition:  TPN CLD resumed 5/8  Plan:  Continue TPN at 1/2 rate (45 ml/hr) today per discussion with Surgery. Electrolytes in TPN: Na 130mEq/L, increase K to 7mEq/L,  Ca 36mEq/L, Mg 26mEq/L, Phos 64mmol/L. Cl:Ac 1:1 Give K runs x 4 Add standard MVI and trace elements to TPN Continue resistant q4h SSI + increase to 25 units regular insulin added to TPN bag. Watch CBG trend closely Continue LR at 30 ml/hr per MD for now F/u TPN labs, Surgery plans for further diet advancement and ability to wean TPN to off   Arturo Morton, PharmD, BCPS Please check AMION for all [facility] contact numbers Clinical Pharmacist 09/23/2020 8:41 AM

## 2020-09-23 NOTE — Progress Notes (Signed)
PROGRESS NOTE    Rhonda [name]  IHK:[id number] DOB: 01/18/1961 DOA: 08/29/2020 PCP: [place].   Brief Narrative: 60 year old with past medical history significant for hypertension, obesity, obstructive sleep apnea not using CPAP, neurosarcoidosis on chronic prednisone, wheelchair-bound due to chronic debility, recurrent GI bleeding with recent multiple hospitalization presented back on 4/13 to the emergency room with frank rectal bleeding and hypotension.  Significant events:   4/4-4/6 Admitted with hematochezia, symptomatically treated, capsule endoscopy and discharged home. 4/13 presented with hematochezia, CT angiogram with acute bleeding in the descending colon status post coil embolization of the distal [place] branch of the superior sigmoidal artery. Insulin infusion for high blood sugars.  Admitted to ICU. 4/15, transferred out of ICU. 4/16, afternoon after eating lunch, patient had severe abdominal pain.  Relieved with pain medication.  A CT scan showed visceral perforation.  Underwent urgent exploratory laparotomy and found to have necrotic sigmoid colon.  Status post Jeanette Caprice procedure with colostomy and wound VAC for midline incision. 4/22-5/2, prolonged ileus and not eating.  On TPN. 5/2-5/3, some return of bowel function.  Started noticing a stool in colostomy.  Tolerating liquids.  5/04: Advance to regular diet.  5/05 overnight patient develop  vomiting: KUB pneumatosis and portal venous gas consistent with bowel ischemia and or infarction.  CT abdomen and pelvis 5/05; persistent small bowel obstruction.  Interval development of extensive pneumatosis throughout the distended loops, particularly in the proximal jejunum and bowel segment closer to a transition point in the right lateral abdomen.  Extensive air within the draining mesentery and portal veins as well as portal venous gas within the liver finding are highly worrisome for developing bowel necrosis.  Possible  filling defect seen in the right lower pulmonary artery though difficult to truly characterize given the finding on margins of imaging recommended CT to rule out PE. -Patient was started on heparin drip. -Evaluated by surgery recommended n.p.o., NG tube and plan to monitor clinically.  5/08: NGT remove, started on clear.    Assessment & Plan:   Active Problems:   GI bleed   Hypotension   Rectal bleeding   AKI (acute kidney injury) ([place])   1-Perforated sigmoid colon with history of diverticulosis and multiple procedures.  IMA embolization 4/13.  Emergency exploratory laparotomy and Hartman's procedure 4/16:  -Postop patient developed prolonged ileus.  Function improving, colostomy with stool. -TPN to be weaned off 5/05 -Completed 4 days antibiotics post sx for feculent peritonitis. Back on ceftriaxone.  -Continue with Wound Vac.  -5/05 overnight patient develop  vomiting: KUB pneumatosis and portal venous gas consistent with bowel ischemia and or infarction.  CT abdomen and pelvis 5/05; persistent small bowel obstruction.  Interval development of extensive pneumatosis throughout the distended loops, particularly in the proximal jejunum and bowel segment closer to a transition point in the right lateral abdomen.  Extensive air within the draining mesentery and portal veins as well as portal venous gas within the liver finding are highly worrisome for developing bowel necrosis.  Possible filling defect seen in the right lower pulmonary artery though difficult to truly characterize given the finding on margins of imaging recommended CT to rule out PE. -Patient was started on heparin drip. -Evaluated by surgery recommended n.p.o., NG tube and plan to monitor clinically.  NG tube remove, started on clear.    2-Acute lower GI bleeding, diverticular bleed.  Hemorrhagic shock secondary to acute GI bleeding and anemia of blood loss. -Hb  has been stable since surgery, 7.6 -  iron studies normal.   -hb remain stable on heparin gtt  3-PE; -CTA confirmed posterior segment left lower lobe PE and a large PE in the left lower lobe pulmonary artery. -Continue with heparin drip, monitor hgb. Oral anticoagulation when ok by sx.  -Lower extremity negative for DVT  4-Type 2 Diabetes with hyperglycemia Holding lantus due to NPO status.  Started on clear today.   5-Neurosarcoidosis, chronic debility and wheelchair-bound status: -Continue with chronic prednisone 40 mg twice daily.  Received preop Solu-Medrol. -While she had persistent ileus she was on IV Solu-Medrol.  6-Obstructive Sleep Apnea: Declined CPAP 7-Hypochloremic, Hyponatremia: Resolved Hypomagnesemia, hypokalemia: Replaced.  8-sinus tachycardia PAC: Plan to increase metoprolol to 50 mg twice daily improved 9-Acute renal failure: Due to #1.  Improved normalized Monitor. Started on fluids, received contrast.   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  I have recommended to follow-up with her PCP as she may need a PET scan  Nutrition Problem: Inadequate oral intake Etiology: altered GI function    Signs/Symptoms: other (comment) (NPO/Clear liquid diet)    Interventions: Boost Breeze,TPN  Estimated body mass index is 31.98 kg/m as calculated from the following:   Height as of this encounter: 5\' 3"  (1.6 m).   Weight as of this encounter: 81.9 kg.   DVT prophylaxis: SCD Code Status: Full code Family Communication: patient  Disposition Plan:  Status is: Inpatient  Remains inpatient appropriate because:IV treatments appropriate due to intensity of illness or inability to take PO   Dispo: The patient is from: Home              Anticipated d/c is to: SNF              Patient currently is medically stable to d/c.   Difficult to place patient No        Consultants:   Sx  CCM  IR  Gastroenterologist   Procedures:   Embolization inferior mesenteric artery 4/13  Hartman's  procedure 4/16.    Antimicrobials:    Subjective: She looks better today, she is alert. Denies abdominal pain, breathing well   Objective: Vitals:   09/22/20 2020 09/23/20 0010 09/23/20 0424 09/23/20 0753  BP: 116/79 123/88 140/84 (!) 159/71  Pulse: 83 83 79 76  Resp: 18 19 18 17   Temp: 98.3 F (36.8 C) 98.4 F (36.9 C) 98.5 F (36.9 C) 97.6 F (36.4 C)  TempSrc: Oral Oral Oral Oral  SpO2: 97% 96% 100% 100%  Weight:   81.9 kg   Height:        Intake/Output Summary (Last 24 hours) at 09/23/2020 1202 Last data filed at 09/23/2020 0926 Gross per 24 hour  Intake 1605.43 ml  Output 2405 ml  Net -799.57 ml   Filed Weights   09/20/20 0500 09/21/20 0422 09/23/20 0424  Weight: 87.3 kg 88 kg 81.9 kg    Examination:  General exam: Chronically ill appearing , NAD Respiratory system: CTA Cardiovascular system: S 1, S 2 RRR Gastrointestinal system: BS present, soft, nt, wound vac in place. Ostomy with bag.  Central nervous system: Alert, conversant Extremities: trace edema    Data Reviewed: I have personally reviewed following labs and imaging studies  CBC: Recent Labs  Lab 09/18/20 0332 09/19/20 0401 09/20/20 0435 09/21/20 0242 09/22/20 0346 09/23/20 0511  WBC 8.4 7.7 8.7 11.0* 11.0* 10.1  NEUTROABS 5.2 5.9 5.4  --  8.1* 7.6  HGB 7.7* 7.6* 8.4* 8.9* 8.8* 8.6*  HCT 26.4* 25.8* 28.9*  30.3* 29.5* 28.9*  MCV 100.0 99.2 99.7 98.7 98.7 98.0  PLT 124* 147* 164 202 214 824   Basic Metabolic Panel: Recent Labs  Lab 09/17/20 0445 09/19/20 0401 09/21/20 0242 09/22/20 0346 09/23/20 0511  NA 140 140 133* 134* 135  K 3.9 4.3 4.1 3.6 3.5  CL 102 103 95* 97* 101  CO2 31 32 30 29 31   GLUCOSE 104* 214* 143* 141* 169*  BUN 17 19 16 11 9   CREATININE 0.40* 0.44 0.45 0.49 0.42*  CALCIUM 8.4* 8.5* 8.1* 7.7* 7.9*  MG 2.1 2.1  --  1.8 2.1  PHOS 3.5  --   --  3.6 2.5   GFR: Estimated Creatinine Clearance: 76.7 mL/min (A) (by C-G formula based on SCr of 0.42 mg/dL  (L)). Liver Function Tests: Recent Labs  Lab 09/17/20 0445 09/21/20 0242 09/22/20 0346  AST 26 36 24  ALT 29 57* 45*  ALKPHOS 131* 201* 150*  BILITOT 0.3 0.5 0.4  PROT 4.5* 4.9* 4.8*  ALBUMIN 1.9* 2.0* 2.0*   No results for input(s): LIPASE, AMYLASE in the last 168 hours. No results for input(s): AMMONIA in the last 168 hours. Coagulation Profile: No results for input(s): INR, PROTIME in the last 168 hours. Cardiac Enzymes: No results for input(s): CKTOTAL, CKMB, CKMBINDEX, TROPONINI in the last 168 hours. BNP (last 3 results) No results for input(s): PROBNP in the last 8760 hours. HbA1C: No results for input(s): HGBA1C in the last 72 hours. CBG: Recent Labs  Lab 09/22/20 1649 09/22/20 2019 09/23/20 0013 09/23/20 0425 09/23/20 0759  GLUCAP 154* 143* 180* 159* 165*   Lipid Profile: Recent Labs    09/22/20 0346  TRIG 83   Thyroid Function Tests: No results for input(s): TSH, T4TOTAL, FREET4, T3FREE, THYROIDAB in the last 72 hours. Anemia Panel: No results for input(s): VITAMINB12, FOLATE, FERRITIN, TIBC, IRON, RETICCTPCT in the last 72 hours. Sepsis Labs: Recent Labs  Lab 09/20/20 2340 09/21/20 0242 09/22/20 0346  LATICACIDVEN 1.3 1.2 0.8    No results found for this or any previous visit (from the past 240 hour(s)).       Radiology Studies: DG Abd Portable 1V  Result Date: 09/22/2020 CLINICAL DATA:  Follow-up small bowel obstruction. EXAM: PORTABLE ABDOMEN - 1 VIEW COMPARISON:  09/21/2020 FINDINGS: There has been a interval decrease in the degree of small-bowel dilation although significant dilation persists. Nasogastric tube is well positioned, tip in the distal stomach. IMPRESSION: 1. Decrease degree of small-bowel distention since the previous day's exam, although significant distension persists. Findings remain consistent with a partial small bowel obstruction. Electronically Signed   By: Lajean Manes M.D.   On: 09/22/2020 09:19   VAS [place] LOWER  EXTREMITY VENOUS (DVT)  Result Date: 09/21/2020  Lower Venous DVT Study Patient Name:  LAURIANA DENES [facility]  Date of Exam:   09/21/2020 Medical Rec #: [id number]       Accession #:    [id number] Date of Birth: 02/13/1961       Patient Gender: F Patient Age:   71Y Exam Location:  [facility] Procedure:      VAS [place] LOWER EXTREMITY VENOUS (DVT) Referring Phys: [id number] Dezirea Mccollister A Cathey [place] --------------------------------------------------------------------------------  Indications: Edema.  Risk Factors: None identified. Limitations: Body habitus and poor ultrasound/tissue interface. Comparison Study: No prior studies. Performing Technologist: Oliver Hum RVT  Examination Guidelines: A complete evaluation includes B-mode imaging, spectral Doppler, color Doppler, and power Doppler as needed of all accessible portions of each vessel. Bilateral testing is  considered an integral part of a complete examination. Limited examinations for reoccurring indications may be performed as noted. The reflux portion of the exam is performed with the patient in reverse Trendelenburg.  +---------+---------------+---------+-----------+----------+-------------------+ RIGHT    CompressibilityPhasicitySpontaneityPropertiesThrombus Aging      +---------+---------------+---------+-----------+----------+-------------------+ CFV      Full           Yes      Yes                                      +---------+---------------+---------+-----------+----------+-------------------+ SFJ      Full                                                             +---------+---------------+---------+-----------+----------+-------------------+ FV Prox  Full                                                             +---------+---------------+---------+-----------+----------+-------------------+ FV Mid   Full                                                              +---------+---------------+---------+-----------+----------+-------------------+ FV DistalFull                                                             +---------+---------------+---------+-----------+----------+-------------------+ PFV      Full                                                             +---------+---------------+---------+-----------+----------+-------------------+ POP      Full           Yes      Yes                                      +---------+---------------+---------+-----------+----------+-------------------+ PTV      Full                                                             +---------+---------------+---------+-----------+----------+-------------------+ PERO  Not well visualized +---------+---------------+---------+-----------+----------+-------------------+   +---------+---------------+---------+-----------+----------+--------------+ LEFT     CompressibilityPhasicitySpontaneityPropertiesThrombus Aging +---------+---------------+---------+-----------+----------+--------------+ CFV      Full           Yes      Yes                                 +---------+---------------+---------+-----------+----------+--------------+ SFJ      Full                                                        +---------+---------------+---------+-----------+----------+--------------+ FV Prox  Full                                                        +---------+---------------+---------+-----------+----------+--------------+ FV Mid   Full                                                        +---------+---------------+---------+-----------+----------+--------------+ FV DistalFull                                                        +---------+---------------+---------+-----------+----------+--------------+ PFV      Full                                                         +---------+---------------+---------+-----------+----------+--------------+ POP      Full           Yes      Yes                                 +---------+---------------+---------+-----------+----------+--------------+ PTV      Full                                                        +---------+---------------+---------+-----------+----------+--------------+ PERO     Full                                                        +---------+---------------+---------+-----------+----------+--------------+     Summary: RIGHT: - There is no evidence of deep vein thrombosis in the lower extremity. However, portions of this examination were limited- see technologist comments above.  - No cystic structure found in the popliteal fossa.  LEFT: - There is no evidence of deep vein thrombosis in the lower extremity. However, portions of this examination were limited- see technologist comments above.  - No cystic structure found in the popliteal fossa.  *See table(s) above for measurements and observations. Electronically signed by Ruta Hinds MD on 09/21/2020 at 5:35:10 PM.    Final         Scheduled Meds: . Chlorhexidine Gluconate Cloth  6 each Topical Daily  . docusate sodium  100 mg Oral BID  . gabapentin  100 mg Oral TID  . insulin aspart  0-20 Units Subcutaneous Q4H  . lidocaine  1 patch Transdermal Q24H  . mouth rinse  15 mL Mouth Rinse BID  . melatonin  9 mg Oral QHS  . methylPREDNISolone (SOLU-MEDROL) injection  40 mg Intravenous Q12H  . metoprolol tartrate  5 mg Intravenous Q6H  . nystatin  5 mL Oral QID  . pantoprazole (PROTONIX) IV  40 mg Intravenous QHS   Continuous Infusions: . cefTRIAXone (ROCEPHIN)  IV 2 g (09/22/20 2221)  . heparin 800 Units/hr (09/23/20 1020)  . lactated ringers 30 mL/hr at 09/23/20 0206  . metronidazole 500 mg (09/23/20 0820)  . potassium chloride 10 mEq (09/23/20 1114)  . TPN ADULT (ION) 45 mL/hr at 09/22/20 1838  . TPN ADULT (ION)        LOS: 25 days    Time spent: 25 minutes    Elmarie Shiley, MD PhD FACP [facility]   If 7PM-7AM, please contact night-coverage [email or web address]  09/23/2020, 12:02 PM

## 2020-09-23 NOTE — Progress Notes (Addendum)
22 Days Post-Op  Subjective: CC: Patient reports that she has had no nausea or abdominal pain since NG tube was clamped yesterday.  She continues to have colostomy output.  She did not get out of bed yesterday.  Afebrile overnight.  White blood cell count normalized.  Objective: Vital signs in last 24 hours: Temp:  [97.4 F (36.3 C)-98.6 F (37 C)] 97.6 F (36.4 C) (05/08 0753) Pulse Rate:  [76-87] 76 (05/08 0753) Resp:  [17-20] 17 (05/08 0753) BP: (114-159)/(71-88) 159/71 (05/08 0753) SpO2:  [96 %-100 %] 100 % (05/08 0753) Weight:  [81.9 kg] 81.9 kg (05/08 0424) Last BM Date: 09/22/20  Intake/Output from previous day: 05/07 0701 - 05/08 0700 In: 1596.8 [I.V.:1346.8; IV Piggyback:250] Out: 2710 [Urine:2710] Intake/Output this shift: No intake/output data recorded.  PE: General: pleasant, WD,obesefemale who is laying in bed in NAD Heart:Reg Lungs: Respiratory effort nonlabored Abd: soft, minimally distended, NT,midline woundwith wound vac in place. Colostomy bag with stool in bag. Unable to visualize stoma 2/2 stool. +BS. NGTclamped.   Lab Results:  Recent Labs    09/22/20 0346 09/23/20 0511  WBC 11.0* 10.1  HGB 8.8* 8.6*  HCT 29.5* 28.9*  PLT 214 234   BMET Recent Labs    09/22/20 0346 09/23/20 0511  NA 134* 135  K 3.6 3.5  CL 97* 101  CO2 29 31  GLUCOSE 141* 169*  BUN 11 9  CREATININE 0.49 0.42*  CALCIUM 7.7* 7.9*   PT/INR No results for input(s): LABPROT, INR in the last 72 hours. CMP     Component Value Date/Time   NA 135 09/23/2020 0511   NA 129 (L) 08/06/2020 1003   K 3.5 09/23/2020 0511   CL 101 09/23/2020 0511   CO2 31 09/23/2020 0511   GLUCOSE 169 (H) 09/23/2020 0511   BUN 9 09/23/2020 0511   BUN 6 08/06/2020 1003   CREATININE 0.42 (L) 09/23/2020 0511   CREATININE 0.85 08/02/2015 1710   CALCIUM 7.9 (L) 09/23/2020 0511   PROT 4.8 (L) 09/22/2020 0346   PROT 4.9 (L) 08/06/2020 1003   ALBUMIN 2.0 (L) 09/22/2020 0346   ALBUMIN  3.2 (L) 08/06/2020 1003   AST 24 09/22/2020 0346   ALT 45 (H) 09/22/2020 0346   ALKPHOS 150 (H) 09/22/2020 0346   BILITOT 0.4 09/22/2020 0346   BILITOT 0.4 08/06/2020 1003   GFRNONAA >60 09/23/2020 0511   GFRNONAA 78 08/02/2015 1710   GFRAA >60 01/17/2020 0546   GFRAA >89 08/02/2015 1710   Lipase     Component Value Date/Time   LIPASE 64 (H) 07/23/2020 0432       Studies/Results: CT ANGIO CHEST PE W OR WO CONTRAST  Result Date: 09/21/2020 CLINICAL DATA:  Shortness of breath and elevated D-dimer EXAM: CT ANGIOGRAPHY CHEST WITH CONTRAST TECHNIQUE: Multidetector CT imaging of the chest was performed using the standard protocol during bolus administration of intravenous contrast. Multiplanar CT image reconstructions and MIPs were obtained to evaluate the vascular anatomy. CONTRAST:  67mL OMNIPAQUE IOHEXOL 350 MG/ML SOLN COMPARISON:  Chest radiograph August 30, 2020; CT angiogram chest November 02, 2019. CT abdomen and pelvis Sep 20, 2020 FINDINGS: Cardiovascular: There is an incompletely obstructing pulmonary embolus in the proximal right lower lobe pulmonary artery. There is a pulmonary embolus in a peripheral branch of the posterior segment of the right lower lobe pulmonary artery. There is a pulmonary embolus in the posterior segment right lower lobe pulmonary artery as well. No appreciable right heart strain. There is  left ventricular hypertrophy. There is no thoracic aortic aneurysm or dissection. Visualized great vessels appear normal. No pericardial effusion or pericardial thickening. Mediastinum/Nodes: There is a nodular focus measuring 7 mm in the left lobe of the thyroid which per consensus guidelines does not warrant additional imaging surveillance. Multiple subcentimeter lymph nodes scattered throughout the mediastinum again noted. No frank adenopathy by size criteria evident. A nasogastric tube passes through the esophagus into the stomach. No esophageal lesions appreciable. Lungs/Pleura:  There is a mass arising in the right lower lobe extending posterior to the left atrium along its rightward aspect measuring 3.2 x 2.1 cm. This mass was present previously and appears slightly larger at this time. No other parenchymal lung mass evident. There are scattered areas of mosaic attenuation without airspace consolidation. No pleural effusions. Trachea and major bronchial structures appear normal. No pneumothorax. Upper Abdomen: There is evidence of portal venous air, also present on abdominal CT 1 day prior. Visualized upper abdominal structures otherwise appear unremarkable. Musculoskeletal: Degenerative change noted in the thoracic spine. No blastic or lytic bone lesions. No chest wall lesions. Review of the MIP images confirms the above findings. IMPRESSION: 1. There are pulmonary emboli in each posterior segment left lower lobe pulmonary artery as well as a larger pulmonary embolus in the proximal left lower lobe pulmonary artery, incompletely obstructing. No appreciable right heart strain. 2. Portal venous air, seen 1 day prior as well. Concern for potential bowel ischemia given this finding. See report from CT abdomen and pelvis Sep 20, 2020 for further clarification. 3. There is a mass in the medial right lower lobe region extending posterior to the left atrium. This mass measures 3.2 x 2.1 cm and is slightly larger compared to June 2021 study. Etiology for this mass uncertain. It may be prudent to correlate with nuclear medicine PET study to assess for degree of metabolic activity. Neoplastic etiology for this lesion cannot be excluded. Nearby lymph nodes in this area do not meet size criteria for pathologic significance. 4. Multiple stable mediastinal lymph nodes which do not meet size criteria for pathologic significance. Clinical significance of the multiplicity of these lymph nodes is uncertain. Nuclear medicine PET study to assess for metabolic activity in these lymph nodes could be helpful in  this regard. 5. No edema or airspace consolidation. No pleural effusions. Somewhat mosaic attenuation in the lungs could indicate underlying small airways obstructive disease. 6.  Left ventricular hypertrophy. Critical Value/emergent results were called by telephone at the time of interpretation on 09/21/2020 at 12:14 pm to provider [facility] , who verbally acknowledged these results. Electronically Signed   By: Lowella Grip III M.D.   On: 09/21/2020 12:15   DG Abd Portable 1V  Result Date: 09/22/2020 CLINICAL DATA:  Follow-up small bowel obstruction. EXAM: PORTABLE ABDOMEN - 1 VIEW COMPARISON:  09/21/2020 FINDINGS: There has been a interval decrease in the degree of small-bowel dilation although significant dilation persists. Nasogastric tube is well positioned, tip in the distal stomach. IMPRESSION: 1. Decrease degree of small-bowel distention since the previous day's exam, although significant distension persists. Findings remain consistent with a partial small bowel obstruction. Electronically Signed   By: Lajean Manes M.D.   On: 09/22/2020 09:19   VAS [place] LOWER EXTREMITY VENOUS (DVT)  Result Date: 09/21/2020  Lower Venous DVT Study Patient Name:  [name]  Date of Exam:   09/21/2020 Medical Rec #: [id number]       Accession #:    [id number] Date of Birth:  1961/03/30       Patient Gender: F Patient Age:   74Y Exam Location:  [facility] Procedure:      VAS [place] LOWER EXTREMITY VENOUS (DVT) Referring Phys: [id number] BELKYS A REGALADO --------------------------------------------------------------------------------  Indications: Edema.  Risk Factors: None identified. Limitations: Body habitus and poor ultrasound/tissue interface. Comparison Study: No prior studies. Performing Technologist: Oliver Hum RVT  Examination Guidelines: A complete evaluation includes B-mode imaging, spectral Doppler, color Doppler, and power Doppler as needed of all accessible portions of each vessel. Bilateral  testing is considered an integral part of a complete examination. Limited examinations for reoccurring indications may be performed as noted. The reflux portion of the exam is performed with the patient in reverse Trendelenburg.  +---------+---------------+---------+-----------+----------+-------------------+ RIGHT    CompressibilityPhasicitySpontaneityPropertiesThrombus Aging      +---------+---------------+---------+-----------+----------+-------------------+ CFV      Full           Yes      Yes                                      +---------+---------------+---------+-----------+----------+-------------------+ SFJ      Full                                                             +---------+---------------+---------+-----------+----------+-------------------+ FV Prox  Full                                                             +---------+---------------+---------+-----------+----------+-------------------+ FV Mid   Full                                                             +---------+---------------+---------+-----------+----------+-------------------+ FV DistalFull                                                             +---------+---------------+---------+-----------+----------+-------------------+ PFV      Full                                                             +---------+---------------+---------+-----------+----------+-------------------+ POP      Full           Yes      Yes                                      +---------+---------------+---------+-----------+----------+-------------------+ PTV  Full                                                             +---------+---------------+---------+-----------+----------+-------------------+ PERO                                                  Not well visualized +---------+---------------+---------+-----------+----------+-------------------+    +---------+---------------+---------+-----------+----------+--------------+ LEFT     CompressibilityPhasicitySpontaneityPropertiesThrombus Aging +---------+---------------+---------+-----------+----------+--------------+ CFV      Full           Yes      Yes                                 +---------+---------------+---------+-----------+----------+--------------+ SFJ      Full                                                        +---------+---------------+---------+-----------+----------+--------------+ FV Prox  Full                                                        +---------+---------------+---------+-----------+----------+--------------+ FV Mid   Full                                                        +---------+---------------+---------+-----------+----------+--------------+ FV DistalFull                                                        +---------+---------------+---------+-----------+----------+--------------+ PFV      Full                                                        +---------+---------------+---------+-----------+----------+--------------+ POP      Full           Yes      Yes                                 +---------+---------------+---------+-----------+----------+--------------+ PTV      Full                                                        +---------+---------------+---------+-----------+----------+--------------+  PERO     Full                                                        +---------+---------------+---------+-----------+----------+--------------+     Summary: RIGHT: - There is no evidence of deep vein thrombosis in the lower extremity. However, portions of this examination were limited- see technologist comments above.  - No cystic structure found in the popliteal fossa.  LEFT: - There is no evidence of deep vein thrombosis in the lower extremity. However, portions of this examination were  limited- see technologist comments above.  - No cystic structure found in the popliteal fossa.  *See table(s) above for measurements and observations. Electronically signed by Ruta Hinds MD on 09/21/2020 at 5:35:10 PM.    Final     Anti-infectives: Anti-infectives (From admission, onward)   Start     Dose/Rate Route Frequency Ordered Stop   09/21/20 0030  cefTRIAXone (ROCEPHIN) 2 g in sodium chloride 0.9 % 100 mL IVPB        2 g 200 mL/hr over 30 Minutes Intravenous Daily at bedtime 09/21/20 0012     09/21/20 0030  metroNIDAZOLE (FLAGYL) IVPB 500 mg        500 mg 100 mL/hr over 60 Minutes Intravenous Every 8 hours 09/21/20 0012     09/01/20 1930  ciprofloxacin (CIPRO) IVPB 400 mg        400 mg 200 mL/hr over 60 Minutes Intravenous Every 12 hours 09/01/20 1836 09/05/20 1927   09/01/20 1930  metroNIDAZOLE (FLAGYL) IVPB 500 mg        500 mg 100 mL/hr over 60 Minutes Intravenous Every 8 hours 09/01/20 1836 09/05/20 2124       Assessment/Plan Hx of HTN DM2 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  OSA ABL anemia- hgb8.6, stable PE -  on heparin gtt - above per [facility]-  Ms. Druschel is a 60 year old female with a history of GI bleed s/p angioembolization of a branch of the IMA on 4/13, who developed a necrotic section of proximal sigmoid colon with perforation and feculent peritonitis S/pExploratory Laparotomy, Partial Colectomy (sigmoid), colostomy creation,Hartman's procedure on 4/16/2022by Dr. Grandville Silos - POD #20 -CT 5/5 w/ dilated bowel and pneumatosis - NGT was subsequently reinserted and patient started on IV abx.    White blood cell has normalized to 10.1 and is downtrending.  Lactic within normal limits.  Her pain has resolved and she is nontender on exam.  She is having colostomy output without n/v since ngt was clamped yesterday.  Will DC NG tube and put on clear liquid diet. No indication for emergent surgical intervention.   - Per  note yesterday separation of stoma from 12-3o'clock but remains functioning and above the fascia. -Continue PT/OT, mobilize - recommending SNF - [facility] following for ostomy education -Vac changes MWF - Pulm toilet  FEN -CLD, on 1/2 TPN, IVF per [facility] VTE -SCDs,heparin gtt ID -Cipro/Flagyl4/16- 4/20, 5/6 >>  Foley - None   LOS: 25 days    Jillyn Ledger , [facility] 09/23/2020, 8:44 AM Please see Amion for pager number during day hours 7:00am-4:30pm

## 2020-09-24 DIAGNOSIS — K922 Gastrointestinal hemorrhage, unspecified: Secondary | ICD-10-CM | POA: Diagnosis not present

## 2020-09-24 LAB — COMPREHENSIVE METABOLIC PANEL
ALT: 29 U/L (ref 0–44)
AST: 17 U/L (ref 15–41)
Albumin: 1.9 g/dL — ABNORMAL LOW (ref 3.5–5.0)
Alkaline Phosphatase: 130 U/L — ABNORMAL HIGH (ref 38–126)
Anion gap: 3 — ABNORMAL LOW (ref 5–15)
BUN: 8 mg/dL (ref 6–20)
CO2: 30 mmol/L (ref 22–32)
Calcium: 7.9 mg/dL — ABNORMAL LOW (ref 8.9–10.3)
Chloride: 100 mmol/L (ref 98–111)
Creatinine, Ser: 0.45 mg/dL (ref 0.44–1.00)
GFR, Estimated: >60 mL/min (ref >60)
Glucose, Bld: 225 mg/dL — ABNORMAL HIGH (ref 70–99)
Potassium: 4 mmol/L (ref 3.5–5.1)
Sodium: 133 mmol/L — ABNORMAL LOW (ref 135–145)
Total Bilirubin: <0.1 mg/dL — ABNORMAL LOW (ref 0.3–1.2)
Total Protein: 4.6 g/dL — ABNORMAL LOW (ref 6.5–8.1)

## 2020-09-24 LAB — CBC WITH DIFFERENTIAL/PLATELET
Abs Immature Granulocytes: 1.48 10*3/uL — ABNORMAL HIGH (ref 0.00–0.07)
Basophils Absolute: 0 10*3/uL (ref 0.0–0.1)
Basophils Relative: 0 %
Eosinophils Absolute: 0 10*3/uL (ref 0.0–0.5)
Eosinophils Relative: 0 %
HCT: 28.3 % — ABNORMAL LOW (ref 36.0–46.0)
Hemoglobin: 8.3 g/dL — ABNORMAL LOW (ref 12.0–15.0)
Immature Granulocytes: 16 %
Lymphocytes Relative: 14 %
Lymphs Abs: 1.3 10*3/uL (ref 0.7–4.0)
MCH: 28.9 pg (ref 26.0–34.0)
MCHC: 29.3 g/dL — ABNORMAL LOW (ref 30.0–36.0)
MCV: 98.6 fL (ref 80.0–100.0)
Monocytes Absolute: 0.3 10*3/uL (ref 0.1–1.0)
Monocytes Relative: 4 %
Neutro Abs: 6.2 10*3/uL (ref 1.7–7.7)
Neutrophils Relative %: 66 %
Platelets: 237 10*3/uL (ref 150–400)
RBC: 2.87 MIL/uL — ABNORMAL LOW (ref 3.87–5.11)
RDW: 18.3 % — ABNORMAL HIGH (ref 11.5–15.5)
WBC: 9.3 10*3/uL (ref 4.0–10.5)
nRBC: 4.1 % — ABNORMAL HIGH (ref 0.0–0.2)

## 2020-09-24 LAB — GLUCOSE, CAPILLARY
Glucose-Capillary: 215 mg/dL — ABNORMAL HIGH (ref 70–99)
Glucose-Capillary: 210 mg/dL — ABNORMAL HIGH (ref 70–99)
Glucose-Capillary: 199 mg/dL — ABNORMAL HIGH (ref 70–99)
Glucose-Capillary: 313 mg/dL — ABNORMAL HIGH (ref 70–99)
Glucose-Capillary: 306 mg/dL — ABNORMAL HIGH (ref 70–99)

## 2020-09-24 LAB — PHOSPHORUS: Phosphorus: 2.2 mg/dL — ABNORMAL LOW (ref 2.5–4.6)

## 2020-09-24 LAB — MAGNESIUM: Magnesium: 1.9 mg/dL (ref 1.7–2.4)

## 2020-09-24 LAB — HEPARIN LEVEL (UNFRACTIONATED): Heparin Unfractionated: 0.34 IU/mL (ref 0.30–0.70)

## 2020-09-24 LAB — PREALBUMIN: Prealbumin: 24.3 mg/dL (ref 18–38)

## 2020-09-24 LAB — TRIGLYCERIDES: Triglycerides: 72 mg/dL (ref <150)

## 2020-09-24 MED ORDER — POTASSIUM PHOSPHATES 15 MMOLE/5ML IV SOLN
20.0000 mmol | Freq: Once | INTRAVENOUS | Status: AC
  Administered 2020-09-24: 20 mmol via INTRAVENOUS
  Filled 2020-09-24: qty 6.67

## 2020-09-24 MED ORDER — MAGNESIUM SULFATE IN D5W 1-5 GM/100ML-% IV SOLN
1.0000 g | Freq: Once | INTRAVENOUS | Status: AC
  Administered 2020-09-24: 1 g via INTRAVENOUS
  Filled 2020-09-24: qty 100

## 2020-09-24 MED ORDER — OXYCODONE HCL 5 MG PO TABS
5.0000 mg | ORAL_TABLET | ORAL | Status: DC | PRN
  Administered 2020-09-24 – 2020-09-27 (×5): 5 mg via ORAL
  Filled 2020-09-24 (×5): qty 1

## 2020-09-24 MED ORDER — TRAVASOL 10 % IV SOLN
INTRAVENOUS | Status: AC
  Filled 2020-09-24: qty 599.4

## 2020-09-24 MED ORDER — INSULIN GLARGINE 100 UNIT/ML ~~LOC~~ SOLN
10.0000 [IU] | Freq: Every day | SUBCUTANEOUS | Status: DC
  Administered 2020-09-24 – 2020-09-27 (×4): 10 [IU] via SUBCUTANEOUS
  Filled 2020-09-24 (×4): qty 0.1

## 2020-09-24 NOTE — Progress Notes (Signed)
PHARMACY - TOTAL PARENTERAL NUTRITION CONSULT NOTE  Indication: Prolonged ileus  Patient Measurements: Height: _0  (160 cm) Weight: 94 kg (207 lb 3.7 oz) IBW/kg (Calculated) : 52.4 TPN AdjBW (KG): 59.2 Body mass index is 36.71 kg/m.  Assessment:  62 YOF who presented with necrotic section of proximal sigmoid colon with perforation and feculent peritonitis, now s/p ex lap with partial sigmoid colectomy and colostomy and Hartman's procedure on 09/01/20. Developed ileus post op and has remained NPO. TPN started 4/21 - 5/4. Pharmacy consulted to resume TPN on 5/7.  Glucose / Insulin: A1c 11.8%, on Lantus 18/d PTA.  CBGs 200s Utilized 48 units SSI in last 24hrs and 25units regular insulin TPN bag Prednisone transitioned back to SM 40 q12h on 5/6 *Previously CBGs trended up on TPN and IV steroids - required up to 135 units regular insulin in TPN + rSSI + Novolog 6 units Q4H while on TPN* Electrolytes: low Na, K 4 post 4 runs, Phos down 2.2, Mag 1.9 (goal >/= 2), others WNL Renal: SCr <1 stable, BUN WNL Hepatic: AST / ALT / Tbili / TG WNL. Alk phos down to 130. Prealbumin WNL, albumin 1.9 Intake / Output; MIVF: UOP 1.1 ml/kg/hr, colostomy 321m, net -11.4L, LR at 30 ml/hr GI Imaging:  - 4/24 CT: SBO, suspicious for adhesions, diverticulosis - 5/5 CT abd/pelvis: persistent SBO, extensive pneumatosis throughout distended loops, extensive air within draining mesentery/portal veins, portal venous gas within liver, highly worrisome for developing bowel necrosis - 5/7 Abx XR: decreased SB distention but still significant, pSBO GI Surgeries / Procedures:  4/16 ex-lap with Hartman's procedure   Central access: PICC placed 09/06/20 TPN start date: 09/06/20 - 09/19/20; restarted 09/22/20  Nutritional Goals: (per RD rec on 5/6) 1900-2100 kCal, 110-125 g protein, > 2L fluid per day  TPN goal rate is 90 ml/hr - will provide 120g AA, 59g SMOF lipids, and 259g CHO for total of 1954 kCal, meeting 100% of  patient needs  Current Nutrition:  TPN CLD resumed 5/8  Plan:  Continue TPN at 1/2 rate per Surgery - TPN at 45 ml/hr.  May advance to FLD. Electrolytes in TPN: increase Na 1546m/L, increase K to 6026mL on 5/8, Ca 4mE9m, increase Mg 10mE57m increase Phos 18mmo80m change Cl:Ac 2:1 Add standard MVI and trace elements to TPN Continue resistant SSI Q4H and increase regular insulin in TPN to 45 units Continue LR at 30 ml/hr per MD for now KPhos 20 mmol IV Mag sulfate 1gm IV F/u AM labs, diet advancement and ability to wean off TPN  Ixel Boehning D. Owain Eckerman, Mina MarblemD, BCPS, BCCCP [place]022, 10:46 AM

## 2020-09-24 NOTE — Progress Notes (Signed)
23 Days Post-Op  Subjective: CC: When asked about abdominal pain today, patient reports that she has lower abdominal pain that is throbbing in nature every morning, rated 5/10 and relieved with pain medication.  She reports this is been ongoing for several days.  We discussed that she denied abdominal pain over the weekend but she clarified that she does have some lower abdominal pain each day in the morning that resolves after pain meds and then she has no pain throughout the day. Currently NT on exam. She reports she tolerated clear liquids yesterday without increased abdominal pain or any abdominal pain at all for associated nausea/vomiting.  Continues to have colostomy output.  White blood cell remains normal at 9.3.  Objective: Vital signs in last 24 hours: Temp:  [97.6 F (36.4 C)-98.9 F (37.2 C)] 98.9 F (37.2 C) (05/09 0741) Pulse Rate:  [67-90] 67 (05/09 0741) Resp:  [16-18] 18 (05/09 0741) BP: (110-130)/(61-85) 128/61 (05/09 0741) SpO2:  [90 %-100 %] 99 % (05/09 0741) Weight:  [94 kg] 94 kg (05/09 0342) Last BM Date: 09/23/20  Intake/Output from previous day: 05/08 0701 - 05/09 0700 In: 1903.1 [I.V.:1203.1; IV Piggyback:700] Out: 2750 [Urine:2400; Stool:350] Intake/Output this shift: No intake/output data recorded.  PE: General: pleasant, WD,obesefemale who is laying in bed in NAD Heart:Reg Lungs: Respiratory effort nonlabored Abd: soft,minimally distended, NT,midline woundwithgranulation tissue and some fibrinous exudateand inferiortract4.5cm (where sterile cotton swab is located). Stoma with separation from 12-3o'clock, viable and well above fascial level,liquidstooland gaspresent in ostomy appliance        Lab Results:  Recent Labs    09/23/20 0511 09/24/20 0356  WBC 10.1 9.3  HGB 8.6* 8.3*  HCT 28.9* 28.3*  PLT 234 237   BMET Recent Labs    09/23/20 0511 09/24/20 0356  NA 135 133*  K 3.5 4.0  CL 101 100  CO2 31 30  GLUCOSE  169* 225*  BUN 9 8  CREATININE 0.42* 0.45  CALCIUM 7.9* 7.9*   PT/INR No results for input(s): LABPROT, INR in the last 72 hours. CMP     Component Value Date/Time   NA 133 (L) 09/24/2020 0356   NA 129 (L) 08/06/2020 1003   K 4.0 09/24/2020 0356   CL 100 09/24/2020 0356   CO2 30 09/24/2020 0356   GLUCOSE 225 (H) 09/24/2020 0356   BUN 8 09/24/2020 0356   BUN 6 08/06/2020 1003   CREATININE 0.45 09/24/2020 0356   CREATININE 0.85 08/02/2015 1710   CALCIUM 7.9 (L) 09/24/2020 0356   PROT 4.6 (L) 09/24/2020 0356   PROT 4.9 (L) 08/06/2020 1003   ALBUMIN 1.9 (L) 09/24/2020 0356   ALBUMIN 3.2 (L) 08/06/2020 1003   AST 17 09/24/2020 0356   ALT 29 09/24/2020 0356   ALKPHOS 130 (H) 09/24/2020 0356   BILITOT <0.1 (L) 09/24/2020 0356   BILITOT 0.4 08/06/2020 1003   GFRNONAA >60 09/24/2020 0356   GFRNONAA 78 08/02/2015 1710   GFRAA >60 01/17/2020 0546   GFRAA >89 08/02/2015 1710   Lipase     Component Value Date/Time   LIPASE 64 (H) 07/23/2020 0432       Studies/Results: No results found.  Anti-infectives: Anti-infectives (From admission, onward)   Start     Dose/Rate Route Frequency Ordered Stop   09/21/20 0030  cefTRIAXone (ROCEPHIN) 2 g in sodium chloride 0.9 % 100 mL IVPB        2 g 200 mL/hr over 30 Minutes Intravenous Daily at bedtime 09/21/20 0012  09/21/20 0030  metroNIDAZOLE (FLAGYL) IVPB 500 mg        500 mg 100 mL/hr over 60 Minutes Intravenous Every 8 hours 09/21/20 0012     09/01/20 1930  ciprofloxacin (CIPRO) IVPB 400 mg        400 mg 200 mL/hr over 60 Minutes Intravenous Every 12 hours 09/01/20 1836 09/05/20 1927   09/01/20 1930  metroNIDAZOLE (FLAGYL) IVPB 500 mg        500 mg 100 mL/hr over 60 Minutes Intravenous Every 8 hours 09/01/20 1836 09/05/20 2124       Assessment/Plan Hx of HTN DM2 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  OSA ABL anemia- hgb8.3, stable PE - on heparin gtt - above per  [facility]-  Ms. Skillman is a 60 year old female with a history of GI bleed s/p angioembolization of a branch of the IMA on 4/13, who developed a necrotic section of proximal sigmoid colon with perforation and feculent peritonitis S/pExploratory Laparotomy, Partial Colectomy (sigmoid), colostomy creation,Hartman's procedure on 4/16/2022by Dr. Grandville Silos - POD #21 -CT 5/5 w/ dilated bowel and pneumatosis - NGT was subsequently reinserted and patient started on IV abx.White blood cell has normalized to 9.3 and is downtrending. Lactic within normal limits. She is nontender on exam. She is having colostomy output without n/v and tolerating cld. Did mention some pain this am in the lower abdomen that occurs each morning and resolves after pain medication and does not reoccur throughout the day.  Will discuss with MD but suspect we will be able to advance to full liquid diet and monitor closely.  No indication for emergent surgical intervention.  - Monitor separation of stoma from 12-3o'clock. Stoma remains functioning and above the fascia. -Continue PT/OT, mobilize - recommending SNF - [facility]OC following for ostomy education -Vac changes MWF - Pulm toilet  FEN -CLD (likely adv to FLD), on 1/2 TPN, IVF per [facility] VTE -SCDs,heparin gtt ID -Cipro/Flagyl4/16- 4/20, 5/6 >> Foley - None   LOS: 26 days    Jillyn Ledger , [facility] Surgery 09/24/2020, 9:25 AM Please see Amion for pager number during day hours 7:00am-4:30pm

## 2020-09-24 NOTE — Progress Notes (Signed)
PROGRESS NOTE    Rhonda [name]  WUJ:[id number] DOB: 1960/10/23 DOA: 08/29/2020 PCP: [place].   Brief Narrative: 60 year old with past medical history significant for hypertension, obesity, obstructive sleep apnea not using CPAP, neurosarcoidosis on chronic prednisone, wheelchair-bound due to chronic debility, recurrent GI bleeding with recent multiple hospitalization presented back on 4/13 to the emergency room with frank rectal bleeding and hypotension.  Significant events:   4/4-4/6 Admitted with hematochezia, symptomatically treated, capsule endoscopy and discharged home. 4/13 presented with hematochezia, CT angiogram with acute bleeding in the descending colon status post coil embolization of the distal Arcade branch of the superior sigmoidal artery. Insulin infusion for high blood sugars.  Admitted to ICU. 4/15, transferred out of ICU. 4/16, afternoon after eating lunch, patient had severe abdominal pain.  Relieved with pain medication.  A CT scan showed visceral perforation.  Underwent urgent exploratory laparotomy and found to have necrotic sigmoid colon.  Status post Jeanette Caprice procedure with colostomy and wound VAC for midline incision. 4/22-5/2, prolonged ileus and not eating.  On TPN. 5/2-5/3, some return of bowel function.  Started noticing a stool in colostomy.  Tolerating liquids.  5/04: Advance to regular diet.  5/05 overnight patient develop  vomiting: KUB pneumatosis and portal venous gas consistent with bowel ischemia and or infarction.  CT abdomen and pelvis 5/05; persistent small bowel obstruction.  Interval development of extensive pneumatosis throughout the distended loops, particularly in the proximal jejunum and bowel segment closer to a transition point in the right lateral abdomen.  Extensive air within the draining mesentery and portal veins as well as portal venous gas within the liver finding are highly worrisome for developing bowel necrosis.  Possible  filling defect seen in the right lower pulmonary artery though difficult to truly characterize given the finding on margins of imaging recommended CT to rule out PE. -Patient was started on heparin drip. -Evaluated by surgery recommended n.p.o., NG tube and plan to monitor clinically.  5/08: NGT remove, started on clear.    Assessment & Plan:   Active Problems:   GI bleed   Hypotension   Rectal bleeding   AKI (acute kidney injury) ([place])   1-Perforated sigmoid colon with history of diverticulosis and multiple procedures.  IMA embolization 4/13.  Emergency exploratory laparotomy and Hartman's procedure 4/16:  -Postop patient developed prolonged ileus.  Function improving, colostomy with stool. -TPN to be weaned off 5/05 -Completed 4 days antibiotics post sx for feculent peritonitis. Back on ceftriaxone.  -Continue with Wound Vac.  -5/05 overnight patient develop  vomiting: KUB pneumatosis and portal venous gas consistent with bowel ischemia and or infarction.  CT abdomen and pelvis 5/05; persistent small bowel obstruction.  Interval development of extensive pneumatosis throughout the distended loops, particularly in the proximal jejunum and bowel segment closer to a transition point in the right lateral abdomen.  Extensive air within the draining mesentery and portal veins as well as portal venous gas within the liver finding are highly worrisome for developing bowel necrosis.  Possible filling defect seen in the right lower pulmonary artery though difficult to truly characterize given the finding on margins of imaging recommended CT to rule out PE. -Patient was started on heparin drip. Diet advanced to full liquid diet.    2-Acute lower GI bleeding, diverticular bleed.  Hemorrhagic shock secondary to acute GI bleeding and anemia of blood loss. -Hb  has been stable since surgery.  -iron studies normal.  -hb remain stable on heparin gtt. Hb at 8  3-PE; -CTA confirmed posterior segment  left lower lobe PE and a large PE in the left lower lobe pulmonary artery. -Continue with heparin drip, monitor hgb. Oral anticoagulation when ok by sx.  -Lower extremity negative for DVT  4-Type 2 Diabetes with hyperglycemia Holding lantus due to NPO status.  Will start lantus.    5-Neurosarcoidosis, chronic debility and wheelchair-bound status: -Continue with chronic prednisone 40 mg twice daily.  Received preop Solu-Medrol. -While she had persistent ileus she was on IV Solu-Medrol.   6-Obstructive Sleep Apnea: Declined CPAP 7-Hypochloremic, Hyponatremia: Resolved Hypomagnesemia, hypokalemia: Replaced.  8-Sinus tachycardia PAC: Plan to increase metoprolol to 50 mg twice daily improved 9-Acute renal failure: Due to #1.  Improved normalized Monitor. Started on fluids, received contrast.   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  I have recommended to follow-up with her PCP as she may need a PET scan  Nutrition Problem: Inadequate oral intake Etiology: altered GI function    Signs/Symptoms: other (comment) (NPO/Clear liquid diet)    Interventions: Boost Breeze,TPN  Estimated body mass index is 36.71 kg/m as calculated from the following:   Height as of this encounter: 5\' 3"  (1.6 m).   Weight as of this encounter: 94 kg.   DVT prophylaxis: SCD Code Status: Full code Family Communication: patient  Disposition Plan:  Status is: Inpatient  Remains inpatient appropriate because:IV treatments appropriate due to intensity of illness or inability to take PO   Dispo: The patient is from: Home              Anticipated d/c is to: SNF              Patient currently is medically stable to d/c.   Difficult to place patient No        Consultants:   Sx  CCM  IR  Gastroenterologist   Procedures:   Embolization inferior mesenteric artery 4/13  Hartman's procedure 4/16.    Antimicrobials:    Subjective: She had some lower quadrant  abdominal pain today, she reported she has been having it once day , get better with pain medications.  Denies worsening pain after meals.   Objective: Vitals:   09/24/20 0342 09/24/20 0741 09/24/20 1151 09/24/20 1657  BP: 119/68 128/61 (!) 148/86 130/80  Pulse: 85 67 68 91  Resp: 18 18 20 19   Temp: 98.3 F (36.8 C) 98.9 F (37.2 C) 98.6 F (37 C) 98.5 F (36.9 C)  TempSrc: Oral Oral Oral Oral  SpO2: 100% 99% 98% 100%  Weight: 94 kg     Height:        Intake/Output Summary (Last 24 hours) at 09/24/2020 1743 Last data filed at 09/24/2020 1740 Gross per 24 hour  Intake 1813.15 ml  Output 3450 ml  Net -1636.85 ml   Filed Weights   09/21/20 0422 09/23/20 0424 09/24/20 0342  Weight: 88 kg 81.9 kg 94 kg    Examination:  General exam: NAD Respiratory system: CTA Cardiovascular system: S 1, S 2 RRR Gastrointestinal system: BS present, soft, ostomy in place, wound vac Central nervous system: alert, conversant Extremities: Trace edema    Data Reviewed: I have personally reviewed following labs and imaging studies  CBC: Recent Labs  Lab 09/19/20 0401 09/20/20 0435 09/21/20 0242 09/22/20 0346 09/23/20 0511 09/24/20 0356  WBC 7.7 8.7 11.0* 11.0* 10.1 9.3  NEUTROABS 5.9 5.4  --  8.1* 7.6 6.2  HGB 7.6* 8.4* 8.9* 8.8* 8.6* 8.3*  HCT 25.8* 28.9* 30.3* 29.5*  28.9* 28.3*  MCV 99.2 99.7 98.7 98.7 98.0 98.6  PLT 147* 164 202 214 234 914   Basic Metabolic Panel: Recent Labs  Lab 09/19/20 0401 09/21/20 0242 09/22/20 0346 09/23/20 0511 09/24/20 0356  NA 140 133* 134* 135 133*  K 4.3 4.1 3.6 3.5 4.0  CL 103 95* 97* 101 100  CO2 32 30 29 31 30   GLUCOSE 214* 143* 141* 169* 225*  BUN 19 16 11 9 8   CREATININE 0.44 0.45 0.49 0.42* 0.45  CALCIUM 8.5* 8.1* 7.7* 7.9* 7.9*  MG 2.1  --  1.8 2.1 1.9  PHOS  --   --  3.6 2.5 2.2*   GFR: Estimated Creatinine Clearance: 82.5 mL/min (by C-G formula based on SCr of 0.45 mg/dL). Liver Function Tests: Recent Labs  Lab  09/21/20 0242 09/22/20 0346 09/24/20 0356  AST 36 24 17  ALT 57* 45* 29  ALKPHOS 201* 150* 130*  BILITOT 0.5 0.4 <0.1*  PROT 4.9* 4.8* 4.6*  ALBUMIN 2.0* 2.0* 1.9*   No results for input(s): LIPASE, AMYLASE in the last 168 hours. No results for input(s): AMMONIA in the last 168 hours. Coagulation Profile: No results for input(s): INR, PROTIME in the last 168 hours. Cardiac Enzymes: No results for input(s): CKTOTAL, CKMB, CKMBINDEX, TROPONINI in the last 168 hours. BNP (last 3 results) No results for input(s): PROBNP in the last 8760 hours. HbA1C: No results for input(s): HGBA1C in the last 72 hours. CBG: Recent Labs  Lab 09/23/20 2341 09/24/20 0341 09/24/20 0744 09/24/20 1149 09/24/20 1729  GLUCAP 220* 215* 210* 199* 313*   Lipid Profile: Recent Labs    09/22/20 0346 09/24/20 0356  TRIG 83 72   Thyroid Function Tests: No results for input(s): TSH, T4TOTAL, FREET4, T3FREE, THYROIDAB in the last 72 hours. Anemia Panel: No results for input(s): VITAMINB12, FOLATE, FERRITIN, TIBC, IRON, RETICCTPCT in the last 72 hours. Sepsis Labs: Recent Labs  Lab 09/20/20 2340 09/21/20 0242 09/22/20 0346  LATICACIDVEN 1.3 1.2 0.8    No results found for this or any previous visit (from the past 240 hour(s)).       Radiology Studies: No results found.      Scheduled Meds: . Chlorhexidine Gluconate Cloth  6 each Topical Daily  . docusate sodium  100 mg Oral BID  . gabapentin  100 mg Oral TID  . insulin aspart  0-20 Units Subcutaneous Q4H  . lidocaine  1 patch Transdermal Q24H  . mouth rinse  15 mL Mouth Rinse BID  . melatonin  9 mg Oral QHS  . methylPREDNISolone (SOLU-MEDROL) injection  40 mg Intravenous Q12H  . metoprolol tartrate  5 mg Intravenous Q6H  . nystatin  5 mL Oral QID  . pantoprazole (PROTONIX) IV  40 mg Intravenous QHS   Continuous Infusions: . cefTRIAXone (ROCEPHIN)  IV 2 g (09/23/20 2122)  . heparin 800 Units/hr (09/23/20 1020)  . lactated  ringers 30 mL/hr at 09/23/20 0206  . metronidazole 500 mg (09/24/20 0903)  . potassium PHOSPHATE IVPB (in mmol) 20 mmol (09/24/20 1406)  . TPN ADULT (ION) Stopped (09/24/20 1741)  . TPN ADULT (ION) 45 mL/hr at 09/24/20 1740     LOS: 26 days    Time spent: 25 minutes    Elmarie Shiley, MD PhD FACP [facility]   If 7PM-7AM, please contact night-coverage [email or web address]  09/24/2020, 5:43 PM

## 2020-09-24 NOTE — Consult Note (Addendum)
[place] Nurse wound follow up Patient receiving care in [place]. Rhonda Apa, PA at bedside.  Wound type:surgical Wound WUJ:WJXB with some bleeding today Drainage (amount, consistency, odor)purulentserosanginous in canister Periwound:intact Dressing procedure/placement/frequency:two pieces of black foam removed.One piece of black foam packed into inferior aspect of the wound.One piece of narrow black foam placed into wound bed. Drape applied, immediate seal obtained. More supplies ordered to be placed at bedside  [facility] Nurse ostomy follow up Stoma type/location:LUQ colostomy Stomal assessment/size:retracted, pink, oval shaped Measures 15/8" Oval Mucocutaneous separationfrom 6:00 to 12:00 improving. Peristomal assessment:intact Treatment options for stomal/peristomal skin:barrier ring and convex pouch Output: soft brown stool Ostomy pouching: 1pc.Flexible convex, [name] [phone] Education provided:none Enrolled patient in [facility] Discharge program: No Supplies at bedside.  [name], MSN, RN, [place], [name], [facility] Wound Treatment Associate Pager [phone]

## 2020-09-24 NOTE — Progress Notes (Signed)
[place] for Heparin  Indication: pulmonary embolus  Allergies  Allergen Reactions  . Acetaminophen Other (See Comments)    Patient states tylenol makes her sick.     Patient Measurements: Height: 5\' 3"  (160 cm) Weight: 94 kg (207 lb 3.7 oz) IBW/kg (Calculated) : 52.4  Vital Signs: Temp: 98.3 F (36.8 C) (05/09 0342) Temp Source: Oral (05/09 0342) BP: 119/68 (05/09 0342) Pulse Rate: 85 (05/09 0342)  Labs: Recent Labs    09/22/20 0346 09/22/20 0356 09/22/20 1217 09/23/20 0511 09/24/20 0356  HGB 8.8*  --   --  8.6* 8.3*  HCT 29.5*  --   --  28.9* 28.3*  PLT 214  --   --  234 237  HEPARINUNFRC  --    < > 0.47 0.42 0.34  CREATININE 0.49  --   --  0.42* 0.45   < > = values in this interval not displayed.    Estimated Creatinine Clearance: 82.5 mL/min (by C-G formula based on SCr of 0.45 mg/dL).  Assessment: 60 y.o. female with new PE for heparin. Daily heparin level within goal range at 0.34. No signs of bleed and CBC stable. Will need to monitor physician plans for long-term treatment.   Goal of Therapy:  Heparin level 0.3-0.5 units/ml (at risk for hemorrhagic necrosis per MD) Monitor platelets by anticoagulation protocol: Yes   Plan:  Continue Heparin 800 units/hr Check AM HL  Monitor S/sx of bleed  Elsbeth Yearick A. Levada Dy, PharmD, BCPS, FNKF Clinical Pharmacist [place] Please utilize Amion for appropriate phone number to reach the unit pharmacist ([place])

## 2020-09-25 DIAGNOSIS — K922 Gastrointestinal hemorrhage, unspecified: Secondary | ICD-10-CM | POA: Diagnosis not present

## 2020-09-25 LAB — GLUCOSE, CAPILLARY
Glucose-Capillary: 164 mg/dL — ABNORMAL HIGH (ref 70–99)
Glucose-Capillary: 172 mg/dL — ABNORMAL HIGH (ref 70–99)
Glucose-Capillary: 173 mg/dL — ABNORMAL HIGH (ref 70–99)
Glucose-Capillary: 198 mg/dL — ABNORMAL HIGH (ref 70–99)
Glucose-Capillary: 252 mg/dL — ABNORMAL HIGH (ref 70–99)
Glucose-Capillary: 255 mg/dL — ABNORMAL HIGH (ref 70–99)
Glucose-Capillary: 261 mg/dL — ABNORMAL HIGH (ref 70–99)

## 2020-09-25 LAB — CBC WITH DIFFERENTIAL/PLATELET
Abs Immature Granulocytes: 0.3 10*3/uL — ABNORMAL HIGH (ref 0.00–0.07)
Basophils Absolute: 0 10*3/uL (ref 0.0–0.1)
Basophils Relative: 0 %
Eosinophils Absolute: 0 10*3/uL (ref 0.0–0.5)
Eosinophils Relative: 0 %
HCT: 28.9 % — ABNORMAL LOW (ref 36.0–46.0)
Hemoglobin: 8.4 g/dL — ABNORMAL LOW (ref 12.0–15.0)
Lymphocytes Relative: 2 %
Lymphs Abs: 0.3 10*3/uL — ABNORMAL LOW (ref 0.7–4.0)
MCH: 28.9 pg (ref 26.0–34.0)
MCHC: 29.1 g/dL — ABNORMAL LOW (ref 30.0–36.0)
MCV: 99.3 fL (ref 80.0–100.0)
Metamyelocytes Relative: 1 %
Monocytes Absolute: 1.1 10*3/uL — ABNORMAL HIGH (ref 0.1–1.0)
Monocytes Relative: 8 %
Myelocytes: 1 %
Neutro Abs: 11.6 10*3/uL — ABNORMAL HIGH (ref 1.7–7.7)
Neutrophils Relative %: 88 %
Platelets: 245 10*3/uL (ref 150–400)
RBC: 2.91 MIL/uL — ABNORMAL LOW (ref 3.87–5.11)
RDW: 18.6 % — ABNORMAL HIGH (ref 11.5–15.5)
WBC: 13.2 10*3/uL — ABNORMAL HIGH (ref 4.0–10.5)
nRBC: 2.7 % — ABNORMAL HIGH (ref 0.0–0.2)
nRBC: 4 /100 WBC — ABNORMAL HIGH

## 2020-09-25 LAB — BASIC METABOLIC PANEL
Anion gap: 7 (ref 5–15)
BUN: 5 mg/dL — ABNORMAL LOW (ref 6–20)
CO2: 28 mmol/L (ref 22–32)
Calcium: 8.1 mg/dL — ABNORMAL LOW (ref 8.9–10.3)
Chloride: 98 mmol/L (ref 98–111)
Creatinine, Ser: 0.39 mg/dL — ABNORMAL LOW (ref 0.44–1.00)
GFR, Estimated: >60 mL/min (ref >60)
Glucose, Bld: 178 mg/dL — ABNORMAL HIGH (ref 70–99)
Potassium: 4.4 mmol/L (ref 3.5–5.1)
Sodium: 133 mmol/L — ABNORMAL LOW (ref 135–145)

## 2020-09-25 LAB — HEPARIN LEVEL (UNFRACTIONATED): Heparin Unfractionated: 0.32 IU/mL (ref 0.30–0.70)

## 2020-09-25 LAB — MAGNESIUM: Magnesium: 1.9 mg/dL (ref 1.7–2.4)

## 2020-09-25 LAB — PHOSPHORUS: Phosphorus: 2.7 mg/dL (ref 2.5–4.6)

## 2020-09-25 MED ORDER — MAGNESIUM SULFATE 2 GM/50ML IV SOLN
2.0000 g | Freq: Once | INTRAVENOUS | Status: AC
  Administered 2020-09-25: 2 g via INTRAVENOUS
  Filled 2020-09-25: qty 50

## 2020-09-25 MED ORDER — TRAVASOL 10 % IV SOLN
INTRAVENOUS | Status: AC
  Filled 2020-09-25: qty 599.4

## 2020-09-25 MED ORDER — SODIUM PHOSPHATES 45 MMOLE/15ML IV SOLN
15.0000 mmol | Freq: Once | INTRAVENOUS | Status: AC
  Administered 2020-09-25: 15 mmol via INTRAVENOUS
  Filled 2020-09-25: qty 5

## 2020-09-25 NOTE — Progress Notes (Signed)
Physical Therapy Treatment Patient Details Name: Rhonda [name] MRN: [id number] DOB: 01-Nov-1960 Today's Date: 09/25/2020    History of Present Illness 60 yo female presented to [facility] on 08/29/20 with rectal bleeding (several recent admissions for similar). Pt admitted with hemorrhagic shock.  She is s/p IR for coil embolization of intraluminal extravasation for a distal arcade branch of the IMA on 08/29/20.  She developed abdominal pain and found to have SBO with bowel perforation s/p emergent exp lap, partial colectomy, colostomy, Hartman's and wound vac application 3/00/76.  Medical hx inlcudes DM2, seizures, HTN, DDD, recurrent GIB.  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    PT Comments    Patient received in bed, very pleasant and cooperative today. Bed mobility much improved and she was able to get to EOB with one person assist/second person for line management! Still needs heavy two person physical assist for functional transfers in the stedy and continues to fatigue quite easily, but remains motivated. Left up in recliner with all needs met, chair alarm active and spouse present, RN aware of patient status.     Follow Up Recommendations  SNF     Equipment Recommendations  None recommended by PT    Recommendations for Other Services       Precautions / Restrictions Precautions Precautions: Fall Precaution Comments: Abdomen incision with wound vac, ostomy Restrictions Weight Bearing Restrictions: No    Mobility  Bed Mobility Overal bed mobility: Needs Assistance Bed Mobility: Rolling;Sidelying to Sit Rolling: Mod assist Sidelying to sit: Mod assist;HOB elevated       General bed mobility comments: able to roll and get to sitting at EOB with ModAx1 today with second person managing lines/leads    Transfers Overall transfer level: Needs assistance Equipment used: Ambulation equipment used Transfers: Sit to/from Stand;Stand  Pivot Transfers Sit to Stand: Max assist;+2 physical assistance;From elevated surface Stand pivot transfers: Total assist;+2 physical assistance       General transfer comment: maxAx2/elevated surface and multiple attempts to stand today- needs lots of encouragement. Fatigues quickly. used stedy to pivot into recliner, but able to maintain unsupported sitting in stedy seats much better today  Ambulation/Gait             General Gait Details: unable- fatigue   Stairs             Wheelchair Mobility    Modified Rankin (Stroke Patients Only)       Balance Overall balance assessment: Needs assistance Sitting-balance support: Bilateral upper extremity supported;Feet supported Sitting balance-[name]: Fair Sitting balance - Comments: fatigues easily   Standing balance support: Bilateral upper extremity supported Standing balance-[name]: Poor Standing balance comment: reliant on BUE support and external assist                            Cognition Arousal/Alertness: Awake/alert Behavior During Therapy: WFL for tasks assessed/performed;Flat affect Overall Cognitive Status: History of cognitive impairments - at baseline                                 General Comments: seems at baseline cognitively, very pleasant today and on and off singing during session. Easily frustrated.      Exercises      General Comments        Pertinent Vitals/Pain Pain Assessment: Faces Faces Pain Scale: Hurts little  more Pain Location: abdomen Pain Descriptors / Indicators: Aching;Sore Pain Intervention(s): Limited activity within patient's tolerance;Monitored during session;Repositioned    Home Living                      Prior Function            PT Goals (current goals can now be found in the care plan section) Acute Rehab PT Goals Patient Stated Goal: be able to improve PT Goal Formulation: With patient/family Time For Goal  Achievement: 10/01/20 Potential to Achieve Goals: Good Progress towards PT goals: Progressing toward goals    Frequency    Min 2X/week      PT Plan Current plan remains appropriate    Co-evaluation              AM-PAC PT "6 Clicks" Mobility   Outcome Measure  Help needed turning from your back to your side while in a flat bed without using bedrails?: A Lot Help needed moving from lying on your back to sitting on the side of a flat bed without using bedrails?: Total Help needed moving to and from a bed to a chair (including a wheelchair)?: Total Help needed standing up from a chair using your arms (e.g., wheelchair or bedside chair)?: Total Help needed to walk in hospital room?: Total Help needed climbing 3-5 steps with a railing? : Total 6 Click Score: 7    End of Session Equipment Utilized During Treatment: Gait belt Activity Tolerance: Patient tolerated treatment well Patient left: in chair;with call bell/phone within reach;with family/visitor present;with chair alarm set Nurse Communication: Mobility status;Need for lift equipment PT Visit Diagnosis: Other abnormalities of gait and mobility (R26.89);Muscle weakness (generalized) (M62.81);Pain Pain - part of body:  (abdomen and back)     Time: 0352-4818 PT Time Calculation (min) (ACUTE ONLY): 28 min  Charges:  $Therapeutic Activity: 23-37 mins                     Windell Norfolk, DPT, PN1   Supplemental Physical Therapist [place]    Pager [phone] Acute Rehab Office [phone]

## 2020-09-25 NOTE — Progress Notes (Signed)
[place] for Heparin  Indication: pulmonary embolus  Allergies  Allergen Reactions  . Acetaminophen Other (See Comments)    Patient states tylenol makes her sick.     Patient Measurements: Height: 5\' 3"  (160 cm) Weight: 94 kg (207 lb 3.7 oz) IBW/kg (Calculated) : 52.4  Vital Signs: Temp: 97.9 F (36.6 C) (05/10 0338) Temp Source: Axillary (05/10 0338) BP: 126/91 (05/10 0338) Pulse Rate: 88 (05/10 0338)  Labs: Recent Labs    09/23/20 0511 09/24/20 0356 09/25/20 0358  HGB 8.6* 8.3* 8.4*  HCT 28.9* 28.3* 28.9*  PLT 234 237 245  HEPARINUNFRC 0.42 0.34 0.32  CREATININE 0.42* 0.45 0.39*    Estimated Creatinine Clearance: 82.5 mL/min (A) (by C-G formula based on SCr of 0.39 mg/dL (L)).  Assessment: 60 y.o. female with new PE for heparin. Daily heparin level within goal range at 0.32. No signs of bleed and CBC stable. Will need to monitor physician plans for long-term treatment.   Goal of Therapy:  Heparin level 0.3-0.5 units/ml (at risk for hemorrhagic necrosis per MD) Monitor platelets by anticoagulation protocol: Yes   Plan:  Continue Heparin 800 units/hr Check AM HL  Monitor S/sx of bleed  Doryan Bahl A. Levada Dy, PharmD, BCPS, FNKF Clinical Pharmacist [place] Please utilize Amion for appropriate phone number to reach the unit pharmacist ([place])

## 2020-09-25 NOTE — Progress Notes (Signed)
Occupational Therapy Treatment Patient Details Name: Rhonda [name] MRN: [id number] DOB: [birth year] Today's Date: 09/25/2020    History of present illness 60 yo female presented to [facility] on 08/29/20 with rectal bleeding (several recent admissions for similar). Pt admitted with hemorrhagic shock.  She is s/p IR for coil embolization of intraluminal extravasation for a distal arcade branch of the IMA on 08/29/20.  She developed abdominal pain and found to have SBO with bowel perforation s/p emergent exp lap, partial colectomy, colostomy, Hartman's and wound vac application 9/48/54.  Medical hx inlcudes DM2, seizures, HTN, DDD, recurrent GIB.  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   OT comments  Pt received in recliner asleep but agreeable to OT session wanting to return to bed. Pt continues to present with decreased activity tolerance and generalized debility but able to stand to stedy with MOD A +2, total A to transfer back to bed in stedy. MAX A +2 needed to return to supine. Pt would continue to benefit from skilled occupational therapy while admitted and after d/c to address the below listed limitations in order to improve overall functional mobility and facilitate independence with BADL participation. DC plan remains appropriate, will follow acutely per POC.    Follow Up Recommendations  SNF;Supervision/Assistance - 24 hour    Equipment Recommendations  Hospital bed    Recommendations for Other Services      Precautions / Restrictions Precautions Precautions: Fall Precaution Comments: Abdomen incision with wound vac, ostomy Restrictions Weight Bearing Restrictions: No       Mobility Bed Mobility Overal bed mobility: Needs Assistance Bed Mobility: Sit to Supine   Sit to supine: Max assist;+2 for physical assistance;Mod assist   General bed mobility comments: MOD A +2 to return to supine with pt needing assist to lower trunk to bed and  elevate BLEs back to bed    Transfers Overall transfer level: Needs assistance Equipment used: Ambulation equipment used Transfers: Sit to/from [facility] Sit to Stand: Mod assist;+2 physical assistance Stand pivot transfers: Total assist;+2 physical assistance       General transfer comment: pt able to stand to stedy with MOD A +2 from recliner, total A to pivot back to bed in stedy with pt able to maintain upright posture in stedy with min guard and BUE support    Balance Overall balance assessment: Needs assistance Sitting-balance support: Bilateral upper extremity supported;Feet supported Sitting balance-[name]: Fair Sitting balance - Comments: close supervision d/t fatigue   Standing balance support: Bilateral upper extremity supported Standing balance-[name]: Poor Standing balance comment: reliant on BUE support and external assist                           ADL either performed or assessed with clinical judgement   ADL Overall ADL's : Needs assistance/impaired                         Toilet Transfer: Moderate assistance;Total assistance;+2 for physical assistance Toilet Transfer Details (indicate cue type and reason): sit<>stand to stedy with MOD A +2; total for pivot in stedy         Functional mobility during ADLs: Moderate assistance;+2 for physical assistance (MOD A sit<>stand to stedy) General ADL Comments: pt making good progress this session able to stand to stedy with MOD A +2 from low recliner, continues to be limited by decreased activity tolerance and  generalized debility     Vision       Perception     Praxis      Cognition Arousal/Alertness: Awake/alert;Lethargic (initially lethargic but aroused more as session progressed) Behavior During Therapy: WFL for tasks assessed/performed;Flat affect Overall Cognitive Status: History of cognitive impairments - at baseline                                  General Comments: seems at baseline cognitively, very pleasant today and on and off singing during session. Easily frustrated.        Exercises     Shoulder Instructions       General Comments pts husband present during session and assisted as needed during session, VSS on RA    Pertinent Vitals/ Pain       Pain Assessment: Faces Faces Pain Scale: Hurts little more Pain Location: general discomfort with mobility Pain Descriptors / Indicators: Discomfort;Grimacing Pain Intervention(s): Monitored during session;Repositioned;Limited activity within patient's tolerance  Home Living                                          Prior Functioning/Environment              Frequency  Min 2X/week        Progress Toward Goals  OT Goals(current goals can now be found in the care plan section)  Progress towards OT goals: Progressing toward goals  Acute Rehab OT Goals Patient Stated Goal: to get back to bed OT Goal Formulation: With patient Time For Goal Achievement: 10/02/20 Potential to Achieve Goals: [place] Discharge plan remains appropriate;Frequency remains appropriate    Co-evaluation                 AM-PAC OT "6 Clicks" Daily Activity     Outcome Measure   Help from another person eating meals?: A Little Help from another person taking care of personal grooming?: A Little Help from another person toileting, which includes using toliet, bedpan, or urinal?: A Lot Help from another person bathing (including washing, rinsing, drying)?: A Lot Help from another person to put on and taking off regular upper body clothing?: A Lot Help from another person to put on and taking off regular lower body clothing?: Total 6 Click Score: 13    End of Session Equipment Utilized During Treatment: Gait belt;Other (comment) (stedy)  OT Visit Diagnosis: Unsteadiness on feet (R26.81);Other abnormalities of gait and mobility (R26.89);Muscle  weakness (generalized) (M62.81);Other symptoms and signs involving cognitive function;Pain Pain - part of body:  (general discomfort)   Activity Tolerance Patient tolerated treatment well   Patient Left in bed;with call bell/phone within reach;with bed alarm set;with family/visitor present   Nurse Communication Mobility status        Time: 0076-2263 OT Time Calculation (min): 18 min  Charges: OT General Charges $OT Visit: 1 Visit OT Treatments $Self Care/Home Management : 8-22 mins  Harley Alto., COTA/L Acute Rehabilitation Services [place] 09/25/2020, 4:15 PM

## 2020-09-25 NOTE — Progress Notes (Signed)
Inpatient Diabetes Program Recommendations  AACE/ADA: New Consensus Statement on Inpatient Glycemic Control (2015)  Target Ranges:  Prepandial:   less than 140 mg/dL      Peak postprandial:   less than 180 mg/dL (1-2 hours)      Critically ill patients:  140 - 180 mg/dL   Lab Results  Component Value Date   GLUCAP 252 (H) 09/25/2020   HGBA1C 11.8 (H) 07/23/2020    Review of Glycemic Control Results for Rhonda [name], Rhonda [name] (MRN [id number]) as of 09/25/2020 14:11  Ref. Range 09/25/2020 07:45 09/25/2020 11:53  Glucose-Capillary Latest Ref Range: 70 - 99 mg/dL 173 (H) 252 (H)   Diabetes history: DM 2 Outpatient Diabetes medications: Lantus 18 Units daily  Current orders for Inpatient glycemic control:  Lantus 10 units Daily Novolog 0-20 units Q4 hours  Solumedrol 40 mg Q12 TPN 45 units of insulin included Soft diet ordered  Inpatient Diabetes Program Recommendations:    -   Add Novolog 4 units tid meal coverage pt eating >50% of meals.  Thanks,  Tama Headings RN, MSN, BC-ADM Inpatient Diabetes Coordinator Team Pager [phone] (8a-5p)

## 2020-09-25 NOTE — Progress Notes (Signed)
PROGRESS NOTE    Rhonda [name]  NID:[id number] DOB: 06/06/1960 DOA: 08/29/2020 PCP: [facility].   Brief Narrative: 60 year old with past medical history significant for hypertension, obesity, obstructive sleep apnea not using CPAP, neurosarcoidosis on chronic prednisone, wheelchair-bound due to chronic debility, recurrent GI bleeding with recent multiple hospitalization presented back on 4/13 to the emergency room with frank rectal bleeding and hypotension.  Significant events:   4/4-4/6 Admitted with hematochezia, symptomatically treated, capsule endoscopy and discharged home. 4/13 presented with hematochezia, CT angiogram with acute bleeding in the descending colon status post coil embolization of the distal Arcade branch of the superior sigmoidal artery. Insulin infusion for high blood sugars.  Admitted to ICU. 4/15, transferred out of ICU. 4/16, afternoon after eating lunch, patient had severe abdominal pain.  Relieved with pain medication.  A CT scan showed visceral perforation.  Underwent urgent exploratory laparotomy and found to have necrotic sigmoid colon.  Status post Jeanette Caprice procedure with colostomy and wound VAC for midline incision. 4/22-5/2, prolonged ileus and not eating.  On TPN. 5/2-5/3, some return of bowel function.  Started noticing a stool in colostomy.  Tolerating liquids.  5/04: Advance to regular diet.  5/05 overnight patient develop  vomiting: KUB pneumatosis and portal venous gas consistent with bowel ischemia and or infarction.  CT abdomen and pelvis 5/05; persistent small bowel obstruction.  Interval development of extensive pneumatosis throughout the distended loops, particularly in the proximal jejunum and bowel segment closer to a transition point in the right lateral abdomen.  Extensive air within the draining mesentery and portal veins as well as portal venous gas within the liver finding are highly worrisome for developing bowel necrosis.  Possible  filling defect seen in the right lower pulmonary artery though difficult to truly characterize given the finding on margins of imaging recommended CT to rule out PE. -Patient was started on heparin drip. -Evaluated by surgery recommended n.p.o., NG tube and plan to monitor clinically.  5/08: NGT remove, started on clear.  5/10: Diet advance to Soft.    Assessment & Plan:   Active Problems:   GI bleed   Hypotension   Rectal bleeding   AKI (acute kidney injury) ([place])   1-Perforated sigmoid colon with history of diverticulosis and multiple procedures.  IMA embolization 4/13.  Emergency exploratory laparotomy and Hartman's procedure 4/16:  -Postop patient developed prolonged ileus.  Function improving, colostomy with stool. -TPN to be weaned off 5/05 -Completed 4 days antibiotics post sx for feculent peritonitis. Back on ceftriaxone.  -Continue with Wound Vac.  -5/05 overnight patient develop  vomiting: KUB pneumatosis and portal venous gas consistent with bowel ischemia and or infarction.  CT abdomen and pelvis 5/05; persistent small bowel obstruction.  Interval development of extensive pneumatosis throughout the distended loops, particularly in the proximal jejunum and bowel segment closer to a transition point in the right lateral abdomen.  Extensive air within the draining mesentery and portal veins as well as portal venous gas within the liver finding are highly worrisome for developing bowel necrosis.  Possible filling defect seen in the right lower pulmonary artery though difficult to truly characterize given the finding on margins of imaging recommended CT to rule out PE. -Patient was started on heparin drip. Diet advanced to full liquid diet.  -WBC elevated today, monitor.   2-Acute lower GI bleeding, diverticular bleed.  Hemorrhagic shock secondary to acute GI bleeding and anemia of blood loss. -Hb  has been stable since surgery.  -iron studies normal.  -  hb remain stable on heparin  gtt. Hb at 8  3-PE; -CTA confirmed posterior segment left lower lobe PE and a large PE in the left lower lobe pulmonary artery. -Continue with heparin drip, monitor hgb. Oral anticoagulation when ok by sx.  -Lower extremity negative for DVT  4-Type 2 Diabetes with hyperglycemia SSI.  Started lantus 5/09   5-Neurosarcoidosis, chronic debility and wheelchair-bound status: -Continue with chronic prednisone 40 mg twice daily.  Received preop Solu-Medrol. -While she had persistent ileus she was on IV Solu-Medrol.   6-Obstructive Sleep Apnea: Declined CPAP 7-Hypochloremic, Hyponatremia: Resolved Hypomagnesemia, hypokalemia: Replaced.  8-Sinus tachycardia PAC: Plan to increase metoprolol to 50 mg twice daily improved 9-Acute renal failure: Due to #1.  Improved normalized Monitor. NSL.   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  I have recommended to follow-up with her PCP as she may need a PET scan  Nutrition Problem: Inadequate oral intake Etiology: altered GI function    Signs/Symptoms: other (comment) (NPO/Clear liquid diet)    Interventions: Boost Breeze,TPN  Estimated body mass index is 36.71 kg/m as calculated from the following:   Height as of this encounter: 5\' 3"  (1.6 m).   Weight as of this encounter: 94 kg.   DVT prophylaxis: SCD Code Status: Full code Family Communication: patient  Disposition Plan:  Status is: Inpatient  Remains inpatient appropriate because:IV treatments appropriate due to intensity of illness or inability to take PO   Dispo: The patient is from: Home              Anticipated d/c is to: SNF              Patient currently is medically stable to d/c.   Difficult to place patient No        Consultants:   Sx  CCM  IR  Gastroenterologist   Procedures:   Embolization inferior mesenteric artery 4/13  Hartman's procedure 4/16.    Antimicrobials:    Subjective: She denies worsening abdominal pain.  Tolerating full liquid diet.  Objective: Vitals:   09/24/20 2334 09/25/20 0338 09/25/20 0809 09/25/20 1150  BP: 114/71 (!) 126/91 135/65 125/76  Pulse: 100 88 86 89  Resp: 18 18 18 20   Temp: 98 F (36.7 C) 97.9 F (36.6 C) 98.2 F (36.8 C) 98 F (36.7 C)  TempSrc: Oral Axillary Oral Oral  SpO2: 100% 94% 100% 99%  Weight:      Height:        Intake/Output Summary (Last 24 hours) at 09/25/2020 1557 Last data filed at 09/25/2020 1410 Gross per 24 hour  Intake 975.85 ml  Output 3600 ml  Net -2624.15 ml   Filed Weights   09/21/20 0422 09/23/20 0424 09/24/20 0342  Weight: 88 kg 81.9 kg 94 kg    Examination:  General exam: NAD Respiratory system: CTA Cardiovascular system: S 1, S 2 RRR Gastrointestinal system: BS present, soft, wound vac in place, ostomy in place.  Central nervous system: Alert, conversant.  Extremities: Trace edema    Data Reviewed: I have personally reviewed following labs and imaging studies  CBC: Recent Labs  Lab 09/20/20 0435 09/21/20 0242 09/22/20 0346 09/23/20 0511 09/24/20 0356 09/25/20 0358  WBC 8.7 11.0* 11.0* 10.1 9.3 13.2*  NEUTROABS 5.4  --  8.1* 7.6 6.2 11.6*  HGB 8.4* 8.9* 8.8* 8.6* 8.3* 8.4*  HCT 28.9* 30.3* 29.5* 28.9* 28.3* 28.9*  MCV 99.7 98.7 98.7 98.0 98.6 99.3  PLT 164 202 214 234 237 245  Basic Metabolic Panel: Recent Labs  Lab 09/19/20 0401 09/21/20 0242 09/22/20 0346 09/23/20 0511 09/24/20 0356 09/25/20 0358  NA 140 133* 134* 135 133* 133*  K 4.3 4.1 3.6 3.5 4.0 4.4  CL 103 95* 97* 101 100 98  CO2 32 30 29 31 30 28   GLUCOSE 214* 143* 141* 169* 225* 178*  BUN 19 16 11 9 8  5*  CREATININE 0.44 0.45 0.49 0.42* 0.45 0.39*  CALCIUM 8.5* 8.1* 7.7* 7.9* 7.9* 8.1*  MG 2.1  --  1.8 2.1 1.9 1.9  PHOS  --   --  3.6 2.5 2.2* 2.7   GFR: Estimated Creatinine Clearance: 82.5 mL/min (A) (by C-G formula based on SCr of 0.39 mg/dL (L)). Liver Function Tests: Recent Labs  Lab 09/21/20 0242 09/22/20 0346 09/24/20 0356   AST 36 24 17  ALT 57* 45* 29  ALKPHOS 201* 150* 130*  BILITOT 0.5 0.4 <0.1*  PROT 4.9* 4.8* 4.6*  ALBUMIN 2.0* 2.0* 1.9*   No results for input(s): LIPASE, AMYLASE in the last 168 hours. No results for input(s): AMMONIA in the last 168 hours. Coagulation Profile: No results for input(s): INR, PROTIME in the last 168 hours. Cardiac Enzymes: No results for input(s): CKTOTAL, CKMB, CKMBINDEX, TROPONINI in the last 168 hours. BNP (last 3 results) No results for input(s): PROBNP in the last 8760 hours. HbA1C: No results for input(s): HGBA1C in the last 72 hours. CBG: Recent Labs  Lab 09/24/20 1929 09/24/20 2336 09/25/20 0340 09/25/20 0745 09/25/20 1153  GLUCAP 306* 164* 172* 173* 252*   Lipid Profile: Recent Labs    09/24/20 0356  TRIG 72   Thyroid Function Tests: No results for input(s): TSH, T4TOTAL, FREET4, T3FREE, THYROIDAB in the last 72 hours. Anemia Panel: No results for input(s): VITAMINB12, FOLATE, FERRITIN, TIBC, IRON, RETICCTPCT in the last 72 hours. Sepsis Labs: Recent Labs  Lab 09/20/20 2340 09/21/20 0242 09/22/20 0346  LATICACIDVEN 1.3 1.2 0.8    No results found for this or any previous visit (from the past 240 hour(s)).       Radiology Studies: No results found.      Scheduled Meds: . Chlorhexidine Gluconate Cloth  6 each Topical Daily  . docusate sodium  100 mg Oral BID  . gabapentin  100 mg Oral TID  . insulin aspart  0-20 Units Subcutaneous Q4H  . insulin glargine  10 Units Subcutaneous Daily  . lidocaine  1 patch Transdermal Q24H  . mouth rinse  15 mL Mouth Rinse BID  . melatonin  9 mg Oral QHS  . methylPREDNISolone (SOLU-MEDROL) injection  40 mg Intravenous Q12H  . metoprolol tartrate  5 mg Intravenous Q6H  . nystatin  5 mL Oral QID  . pantoprazole (PROTONIX) IV  40 mg Intravenous QHS   Continuous Infusions: . cefTRIAXone (ROCEPHIN)  IV 2 g (09/24/20 2126)  . heparin 800 Units/hr (09/24/20 2133)  . metronidazole Stopped  (09/25/20 1310)  . sodium phosphate  Dextrose 5% IVPB 15 mmol (09/25/20 1312)  . TPN ADULT (ION) 45 mL/hr at 09/24/20 1740  . TPN ADULT (ION)       LOS: 27 days    Time spent: 25 minutes    Elmarie Shiley, MD PhD FACP [facility]   If 7PM-7AM, please contact night-coverage [email or web address]  09/25/2020, 3:57 PM

## 2020-09-25 NOTE — Progress Notes (Addendum)
24 Days Post-Op  Subjective: CC: Patient reports that since yesterday morning she has had no abdominal pain. She did have oxy at 1807 yesterday and 0306 this am but reports that she did not have any abdominal pain. No abdominal pain presently. Denies n/v yesterday or this morning. She is tolerating fld and finishing most of her trays. Having colostomy output. No sob. Voiding.   Objective: Vital signs in last 24 hours: Temp:  [97.9 F (36.6 C)-98.6 F (37 C)] 98.2 F (36.8 C) (05/10 0809) Pulse Rate:  [68-100] 86 (05/10 0809) Resp:  [18-20] 18 (05/10 0809) BP: (112-148)/(65-91) 135/65 (05/10 0809) SpO2:  [94 %-100 %] 100 % (05/10 0809) Last BM Date: 09/24/20  Intake/Output from previous day: 05/09 0701 - 05/10 0700 In: 1813.2 [P.O.:480; I.V.:1146.1; IV Piggyback:187.1] Out: 3800 [Urine:3800] Intake/Output this shift: No intake/output data recorded.  PE: General: pleasant, WD,obesefemale who is laying in bed in NAD Heart:Reg Lungs: CTA b/l. Respiratory effort nonlabored Abd: soft,minimally distended, NT,midline woundwith wound vac in place. Colostomy bag with stool in bag. Unable to visualize stoma2/2 stool. +BS. Msk: 1+ LE edema b/l  Lab Results:  Recent Labs    09/24/20 0356 09/25/20 0358  WBC 9.3 13.2*  HGB 8.3* 8.4*  HCT 28.3* 28.9*  PLT 237 245   BMET Recent Labs    09/24/20 0356 09/25/20 0358  NA 133* 133*  K 4.0 4.4  CL 100 98  CO2 30 28  GLUCOSE 225* 178*  BUN 8 5*  CREATININE 0.45 0.39*  CALCIUM 7.9* 8.1*   PT/INR No results for input(s): LABPROT, INR in the last 72 hours. CMP     Component Value Date/Time   NA 133 (L) 09/25/2020 0358   NA 129 (L) 08/06/2020 1003   K 4.4 09/25/2020 0358   CL 98 09/25/2020 0358   CO2 28 09/25/2020 0358   GLUCOSE 178 (H) 09/25/2020 0358   BUN 5 (L) 09/25/2020 0358   BUN 6 08/06/2020 1003   CREATININE 0.39 (L) 09/25/2020 0358   CREATININE 0.85 08/02/2015 1710   CALCIUM 8.1 (L) 09/25/2020 0358    PROT 4.6 (L) 09/24/2020 0356   PROT 4.9 (L) 08/06/2020 1003   ALBUMIN 1.9 (L) 09/24/2020 0356   ALBUMIN 3.2 (L) 08/06/2020 1003   AST 17 09/24/2020 0356   ALT 29 09/24/2020 0356   ALKPHOS 130 (H) 09/24/2020 0356   BILITOT <0.1 (L) 09/24/2020 0356   BILITOT 0.4 08/06/2020 1003   GFRNONAA >60 09/25/2020 0358   GFRNONAA 78 08/02/2015 1710   GFRAA >60 01/17/2020 0546   GFRAA >89 08/02/2015 1710   Lipase     Component Value Date/Time   LIPASE 64 (H) 07/23/2020 0432       Studies/Results: No results found.  Anti-infectives: Anti-infectives (From admission, onward)   Start     Dose/Rate Route Frequency Ordered Stop   09/21/20 0030  cefTRIAXone (ROCEPHIN) 2 g in sodium chloride 0.9 % 100 mL IVPB        2 g 200 mL/hr over 30 Minutes Intravenous Daily at bedtime 09/21/20 0012     09/21/20 0030  metroNIDAZOLE (FLAGYL) IVPB 500 mg        500 mg 100 mL/hr over 60 Minutes Intravenous Every 8 hours 09/21/20 0012     09/01/20 1930  ciprofloxacin (CIPRO) IVPB 400 mg        400 mg 200 mL/hr over 60 Minutes Intravenous Every 12 hours 09/01/20 1836 09/05/20 1927   09/01/20 1930  metroNIDAZOLE (FLAGYL)  IVPB 500 mg        500 mg 100 mL/hr over 60 Minutes Intravenous Every 8 hours 09/01/20 1836 09/05/20 2124       Assessment/Plan Hx of HTN DM2 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  OSA ABL anemia- hgb8.4, stable PE - on heparin gtt - above per [facility]-  Ms. Becvar is a 60 year old female with a history of GI bleed s/p angioembolization of a branch of the IMA on 4/13, who developed a necrotic section of proximal sigmoid colon with perforation and feculent peritonitis S/pExploratory Laparotomy, Partial Colectomy (sigmoid), colostomy creation,Hartman's procedure on 4/16/2022by Dr. Grandville Silos - POD #22 -CT5/5 w/dilated bowel and pneumatosis - NGTwassubsequentlyreinsertedand patient started onIV abx.Her lactic was wnl and wbc  normalized. She remained NT on exam. NGT was d/c'd and diet was adv to fld. There was no indication for emergent surgical intervention.  - No pain this morning and she remains NT. She is tolerating a FLD without n/v and is having colostomy output. WBC did trend from 9.3 > 13.2. She is afebrile. Okay to adv to soft diet. Will monitor closely and get CBC in am.  - Monitor separation of stoma from 12-3o'clock. Stoma remains functioning and above the fascia. -Continue PT/OT, mobilize - recommending SNF - WOC following for ostomy education -Vac changes MWF - Pulm toilet  FEN -Soft,on 1/2TPN (if tolerates soft diet can likely d/c in AM), IVF per TRH (has some LE edema b/l) VTE -SCDs,heparin gtt ID -Cipro/Flagyl4/16- 4/20, 5/6 >> Foley - None   LOS: 27 days    Jillyn Ledger , [facility] Surgery 09/25/2020, 9:02 AM Please see Amion for pager number during day hours 7:00am-4:30pm

## 2020-09-25 NOTE — Progress Notes (Signed)
PHARMACY - TOTAL PARENTERAL NUTRITION CONSULT NOTE  Indication: Prolonged ileus  Patient Measurements: Height: _0  (160 cm) Weight: 94 kg (207 lb 3.7 oz) IBW/kg (Calculated) : 52.4 TPN AdjBW (KG): 59.2 Body mass index is 36.71 kg/m.  Assessment:  35 YOF who presented with necrotic section of proximal sigmoid colon with perforation and feculent peritonitis, now s/p ex lap with partial sigmoid colectomy and colostomy and Hartman's procedure on 09/01/20. Developed ileus post op and has remained NPO. TPN started 4/21 - 5/4. Pharmacy consulted to resume TPN on 5/7.  Glucose / Insulin: A1c 11.8%, on Lantus 18/d PTA.  CBGs improving. Utilized 48 units SSI in last 24hrs, 45 units in TPN.  MD added Lantus 10/d Prednisone transitioned back to SM 40 q12h on 5/6 *Previously CBGs trended up on TPN and IV steroids - required up to 135 units regular insulin in TPN + rSSI + Novolog 6 units Q4H while on TPN* Electrolytes: low Na (max in TPN), K 4.4 and Phos 2.7 post KPhos 20 mmol, Mag 1.9 post 1gm (goal >/= 2), others WNL Renal: SCr <1 stable, BUN WNL Hepatic: AST / ALT / Tbili / TG WNL. Alk phos down to 130. Prealbumin WNL, albumin 1.9 Intake / Output; MIVF: UOP 1.7 ml/kg/hr, colostomy 49m, net -14L, LR at 30 ml/hr GI Imaging:  - 4/24 CT: SBO, suspicious for adhesions, diverticulosis - 5/5 CT abd/pelvis: persistent SBO, extensive pneumatosis throughout distended loops, extensive air within draining mesentery/portal veins, portal venous gas within liver, highly worrisome for developing bowel necrosis - 5/7 Abx XR: decreased SB distention but still significant, pSBO GI Surgeries / Procedures:  4/16 ex-lap with Hartman's procedure   Central access: PICC placed 09/06/20 TPN start date: 09/06/20 - 09/19/20; restarted 09/22/20  Nutritional Goals: (per RD rec on 5/6) 1900-2100 kCal, 110-125 g protein, > 2L fluid per day  TPN goal rate is 90 ml/hr - will provide 120g AA, 59g SMOF lipids, and 259g CHO for  total of 1954 kCal, meeting 100% of patient needs  Current Nutrition:  TPN FLD  Plan:  Advance to soft diet; continue TPN at 1/2 rate per Surgery - TPN at 45 ml/hr. Electrolytes in TPN: Na 1582m/L, K 6013mL, Ca 4mE27m, increase Mg 10mE93mon 5/9, increase Phos 18mmo12mon 5/9, Cl:Ac 2:1 - no change today Add standard MVI and trace elements to TPN Continue resistant SSI Q4H and 45 units regular insulin in TPN. Lantus 10 units/d per MD; continue given possibility of stopping TPN in AM. Continue LR at 30 ml/hr per MD Mag sulfate 2gm IV NaPhos 15 mmol IV x 1 Standard TPN labs on Mon and Thurs  Rhonda [name], Mina MarblemD, BCPS, BCCCP [place]2022, 10:10 AM

## 2020-09-26 ENCOUNTER — Inpatient Hospital Stay (HOSPITAL_COMMUNITY): Payer: 59

## 2020-09-26 DIAGNOSIS — K631 Perforation of intestine (nontraumatic): Secondary | ICD-10-CM

## 2020-09-26 LAB — CBC WITH DIFFERENTIAL/PLATELET
Abs Immature Granulocytes: 1.1 K/uL — ABNORMAL HIGH (ref 0.00–0.07)
Basophils Absolute: 0 K/uL (ref 0.0–0.1)
Basophils Relative: 0 %
Eosinophils Absolute: 0 K/uL (ref 0.0–0.5)
Eosinophils Relative: 0 %
HCT: 28.9 % — ABNORMAL LOW (ref 36.0–46.0)
Hemoglobin: 8.4 g/dL — ABNORMAL LOW (ref 12.0–15.0)
Lymphocytes Relative: 2 %
Lymphs Abs: 0.3 K/uL — ABNORMAL LOW (ref 0.7–4.0)
MCH: 28.6 pg (ref 26.0–34.0)
MCHC: 29.1 g/dL — ABNORMAL LOW (ref 30.0–36.0)
MCV: 98.3 fL (ref 80.0–100.0)
Metamyelocytes Relative: 3 %
Monocytes Absolute: 0.5 K/uL (ref 0.1–1.0)
Monocytes Relative: 3 %
Myelocytes: 4 %
Neutro Abs: 14.2 K/uL — ABNORMAL HIGH (ref 1.7–7.7)
Neutrophils Relative %: 88 %
Platelets: 242 K/uL (ref 150–400)
RBC: 2.94 MIL/uL — ABNORMAL LOW (ref 3.87–5.11)
RDW: 18.9 % — ABNORMAL HIGH (ref 11.5–15.5)
WBC: 16.1 K/uL — ABNORMAL HIGH (ref 4.0–10.5)
nRBC: 2.7 % — ABNORMAL HIGH (ref 0.0–0.2)
nRBC: 4 /100{WBCs} — ABNORMAL HIGH

## 2020-09-26 LAB — GLUCOSE, CAPILLARY
Glucose-Capillary: 211 mg/dL — ABNORMAL HIGH (ref 70–99)
Glucose-Capillary: 247 mg/dL — ABNORMAL HIGH (ref 70–99)
Glucose-Capillary: 232 mg/dL — ABNORMAL HIGH (ref 70–99)
Glucose-Capillary: 217 mg/dL — ABNORMAL HIGH (ref 70–99)
Glucose-Capillary: 201 mg/dL — ABNORMAL HIGH (ref 70–99)

## 2020-09-26 LAB — HEPARIN LEVEL (UNFRACTIONATED)
Heparin Unfractionated: 0.21 IU/mL — ABNORMAL LOW (ref 0.30–0.70)
Heparin Unfractionated: 0.44 [IU]/mL (ref 0.30–0.70)

## 2020-09-26 MED ORDER — PANTOPRAZOLE SODIUM 40 MG PO TBEC
40.0000 mg | DELAYED_RELEASE_TABLET | Freq: Every day | ORAL | Status: DC
  Administered 2020-09-26 – 2020-09-28 (×3): 40 mg via ORAL
  Filled 2020-09-26 (×3): qty 1

## 2020-09-26 MED ORDER — METHYLPREDNISOLONE SODIUM SUCC 40 MG IJ SOLR: 20.0000 mg | Freq: Two times a day (BID) | INTRAMUSCULAR | Status: DC

## 2020-09-26 MED ORDER — IOHEXOL 9 MG/ML PO SOLN
ORAL | Status: AC
  Administered 2020-09-26: 1000 mL
  Filled 2020-09-26: qty 1000

## 2020-09-26 MED ORDER — INSULIN ASPART 100 UNIT/ML IJ SOLN
0.0000 [IU] | Freq: Three times a day (TID) | INTRAMUSCULAR | Status: DC
  Administered 2020-09-27: 4 [IU] via SUBCUTANEOUS
  Administered 2020-09-27: 7 [IU] via SUBCUTANEOUS
  Administered 2020-09-27: 4 [IU] via SUBCUTANEOUS
  Administered 2020-09-28: 15 [IU] via SUBCUTANEOUS
  Administered 2020-09-28 (×2): 11 [IU] via SUBCUTANEOUS

## 2020-09-26 MED ORDER — IOHEXOL 300 MG/ML  SOLN
75.0000 mL | Freq: Once | INTRAMUSCULAR | Status: AC | PRN
  Administered 2020-09-26: 75 mL via INTRAVENOUS

## 2020-09-26 MED ORDER — METHYLPREDNISOLONE SODIUM SUCC 40 MG IJ SOLR
40.0000 mg | INTRAMUSCULAR | Status: DC
  Administered 2020-09-27: 40 mg via INTRAVENOUS
  Filled 2020-09-26: qty 1

## 2020-09-26 NOTE — Progress Notes (Signed)
[place] notified me of rate change for heparin gtt. New rate of 950 units. Change made at pump and in [facility].

## 2020-09-26 NOTE — Plan of Care (Signed)
  Problem: Education: Goal: Knowledge of General Education information will improve Description: Including pain rating scale, medication(s)/side effects and non-pharmacologic comfort measures Outcome: Progressing   Problem: Health Behavior/Discharge Planning: Goal: Ability to manage health-related needs will improve Outcome: Progressing   Problem: Clinical Measurements: Goal: Ability to maintain clinical measurements within normal limits will improve Outcome: Progressing Goal: Will remain free from infection Outcome: Progressing Goal: Diagnostic test results will improve Outcome: Progressing Goal: Cardiovascular complication will be avoided Outcome: Progressing   Problem: Activity: Goal: Risk for activity intolerance will decrease Outcome: Progressing   Problem: Nutrition: Goal: Adequate nutrition will be maintained Outcome: Progressing   Problem: Coping: Goal: Level of anxiety will decrease Outcome: Progressing   Problem: Elimination: Goal: Will not experience complications related to urinary retention Outcome: Progressing   Problem: Pain Managment: Goal: General experience of comfort will improve Outcome: Progressing   Problem: Safety: Goal: Ability to remain free from injury will improve Outcome: Progressing   Problem: Skin Integrity: Goal: Risk for impaired skin integrity will decrease Outcome: Progressing   Problem: Education: Goal: Ability to identify signs and symptoms of gastrointestinal bleeding will improve Outcome: Progressing   Problem: Bowel/Gastric: Goal: Will show no signs and symptoms of gastrointestinal bleeding Outcome: Progressing   Problem: Fluid Volume: Goal: Will show no signs and symptoms of excessive bleeding Outcome: Progressing   Problem: Clinical Measurements: Goal: Complications related to the disease process, condition or treatment will be avoided or minimized Outcome: Progressing   

## 2020-09-26 NOTE — TOC Progression Note (Addendum)
Transition of Care [facility]) - Progression Note    Patient Details  Name: ZITA OZIMEK MRN: [id number] Date of Birth: July 06, 1960  Transition of Care [facility]) CM/SW [place], LCSW Phone Number: 09/26/2020, 10:09 AM  Clinical Narrative:    10am-CSW left voicemail for [facility] to see if patient's insurance approval is still effective or if it needs updating. CSW updated patient and spouse at bedside.  12pm-Per [facility] she believes authorization should be effective until tomorrow but will check to make sure. Patient would need updated COVID test.   1:20pm-[place] states they do need to do a change request and will keep CSW posted.    Expected Discharge Plan: [place] Barriers to Discharge: Insurance Authorization,Continued Medical Work up  Expected Discharge Plan and Services Expected Discharge Plan: [place] In-house Referral: Clinical Social Work   Post Acute Care Choice: [place] Living arrangements for the past 2 months: Single Family Home                                       Social Determinants of Health (SDOH) Interventions    Readmission Risk Interventions Readmission Risk Prevention Plan 09/12/2020 09/04/2020  Transportation Screening Complete Complete  Medication Review Press photographer) - Referral to Pharmacy  PCP or Specialist appointment within 3-5 days of discharge Complete Complete  HRI or Home Care Consult Complete Complete  SW Recovery Care/Counseling Consult Complete Complete  Palliative Care Screening Not Applicable Not Applicable  Skilled Nursing Facility Complete Complete  Some recent data might be hidden

## 2020-09-26 NOTE — Progress Notes (Addendum)
PHARMACY - TOTAL PARENTERAL NUTRITION CONSULT NOTE  Indication: Prolonged ileus  Patient Measurements: Height: 5\' 3"  (160 cm) Weight: 97.1 kg (214 lb 1.1 oz) IBW/kg (Calculated) : 52.4 TPN AdjBW (KG): 59.2 Body mass index is 37.92 kg/m.  Assessment:  [name] who presented with necrotic section of proximal sigmoid colon with perforation and feculent peritonitis, now s/p ex lap with partial sigmoid colectomy and colostomy and Hartman's procedure on 09/01/20. Developed ileus post op and has remained NPO. TPN started 4/21 - 5/4. Pharmacy consulted to resume TPN on 5/7.  Glucose / Insulin: A1c 11.8%, on Lantus 18/d PTA.  CBGs trending up with oral diet. Utilized 45 units SSI in last 24hrs, 45 units in TPN, Lantus 10/d Prednisone transitioned back to SM 40 q12h on 5/6 *Previously CBGs trended up on TPN and IV steroids - required up to 135 units regular insulin in TPN + rSSI + Novolog 6 units Q4H while on TPN* Electrolytes: 5/10 labs - low Na (max in TPN), Phos 2.7 and given NaPhos 56mmol, Mag 1.9 and 2gm given, others WNL Renal: SCr <1 stable, BUN WNL Hepatic: AST / ALT / Tbili / TG WNL. Alk phos down to 130. Prealbumin WNL, albumin 1.9 Intake / Output; MIVF: UOP 1 ml/kg/hr, colostomy 29mL, drain 279mL, net -16L, LR at 30 ml/hr GI Imaging:  - 4/24 CT: SBO, suspicious for adhesions, diverticulosis - 5/5 CT abd/pelvis: persistent SBO, extensive pneumatosis throughout distended loops, extensive air within draining mesentery/portal veins, portal venous gas within liver, highly worrisome for developing bowel necrosis - 5/7 Abx XR: decreased SB distention but still significant, pSBO GI Surgeries / Procedures:  4/16 ex-lap with Hartman's procedure   Central access: PICC placed 09/06/20 TPN start date: 09/06/20 - 09/19/20; restarted 09/22/20  Nutritional Goals: (per RD rec on 5/6) 1900-2100 kCal, 110-125 g protein, > 2L fluid per day  TPN goal rate is 90 ml/hr - will provide 120g AA, 59g SMOF lipids,  and 259g CHO for total of 1954 kCal, meeting 100% of patient needs  Current Nutrition:  TPN Soft diet - patient reports eating at least 75% of her meals, enjoying the food and denying N/V/abd pain or distension  Plan:  D/C TPN per Surgery - reduce TPN to 25 ml/hr, then stop at 1800 Continue rSSI Q4H and Lantus 10 units SQ daily - further adjustment per MD D/C TPN labs and nursing care orders  Rhonda [name], PharmD, BCPS, [place] 09/26/2020, 10:57 AM

## 2020-09-26 NOTE — Progress Notes (Signed)
PROGRESS NOTE    Rhonda [name]   DUK:[id number]  DOB: 10/04/1960  DOA: 08/29/2020 PCP: [place] [facility].   Brief Narrative:  Rhonda [name] 60 year old with past medical history significant for hypertension, obesity, obstructive sleep apnea not using CPAP, neurosarcoidosis on chronic prednisone, wheelchair-bound due to chronic debility, recurrent GI bleeding with recent multiple hospitalization presented back on 4/13 to the emergency room with frank rectal bleeding and hypotension.   Subjective: No complaints.     Assessment & Plan:   Principal Problem:  Perforated sigmoid colon IMA embolization 4/13.  Emergency exploratory laparotomy and Hartman's procedure 4/16 - management per general surgery - CT being repeated due to rising WBC count - wound vac being changed MWF - on Rocephin and Flagyl  Active Problems:   GI bleed - Hb stable  Acute PE - on Heparin infusion -    Neurosarcoidosis - wheelchair bound - Has been on Solumedrol since 5/6 40 mg BID due to surgery and ileus- wean to 40 mg daily-     DM2 - cont Lantus and Novolog- change Novolog to [facility]   Time spent in minutes: 40 DVT prophylaxis: SCDs Start: 08/29/20 2256    Code Status: Full code Family Communication:  Level of Care: Level of care: Progressive Disposition Plan:  Status is: Inpatient  Remains inpatient appropriate because:IV treatments appropriate due to intensity of illness or inability to take PO   Dispo: The patient is from: Home              Anticipated d/c is to: SNF              Patient currently is not medically stable to d/c.   Difficult to place patient No      Consultants:   General surgery Procedures:   Embolization inferior mesenteric artery 4/13  Hartman's procedure 4/16.  Antimicrobials:  Anti-infectives (From admission, onward)   Start     Dose/Rate Route Frequency Ordered Stop   09/21/20 0030  cefTRIAXone (ROCEPHIN) 2 g in sodium chloride 0.9 %  100 mL IVPB        2 g 200 mL/hr over 30 Minutes Intravenous Daily at bedtime 09/21/20 0012     09/21/20 0030  metroNIDAZOLE (FLAGYL) IVPB 500 mg        500 mg 100 mL/hr over 60 Minutes Intravenous Every 8 hours 09/21/20 0012     09/01/20 1930  ciprofloxacin (CIPRO) IVPB 400 mg        400 mg 200 mL/hr over 60 Minutes Intravenous Every 12 hours 09/01/20 1836 09/05/20 1927   09/01/20 1930  metroNIDAZOLE (FLAGYL) IVPB 500 mg        500 mg 100 mL/hr over 60 Minutes Intravenous Every 8 hours 09/01/20 1836 09/05/20 2124       Objective: Vitals:   09/26/20 0638 09/26/20 0800 09/26/20 1122 09/26/20 1559  BP: 119/72 (!) 122/91 113/75 108/68  Pulse:  85 95 88  Resp:  16 17 16   Temp:  97.7 F (36.5 C) 97.7 F (36.5 C) 98.4 F (36.9 C)  TempSrc:  Oral Axillary Axillary  SpO2:  99% 99% 99%  Weight:      Height:        Intake/Output Summary (Last 24 hours) at 09/26/2020 1821 Last data filed at 09/26/2020 1634 Gross per 24 hour  Intake 1543.58 ml  Output 2900 ml  Net -1356.42 ml   Filed Weights   09/23/20 0424 09/24/20 0342 09/26/20 0331  Weight: 81.9 kg  94 kg 97.1 kg    Examination: General exam: Appears comfortable  HEENT: PERRLA, oral mucosa moist, no sclera icterus or thrush Respiratory system: Clear to auscultation. Respiratory effort normal. Cardiovascular system: S1 & S2 heard, RRR.   Gastrointestinal system: Abdomen soft,  nondistended. Normal bowel sounds. Ostomy noted Central nervous system: Alert and oriented. No focal neurological deficits. Extremities: No cyanosis, clubbing or edema Skin: No rashes or ulcers Psychiatry:  Mood & affect appropriate.     Data Reviewed: I have personally reviewed following labs and imaging studies  CBC: Recent Labs  Lab 09/22/20 0346 09/23/20 0511 09/24/20 0356 09/25/20 0358 09/26/20 0318  WBC 11.0* 10.1 9.3 13.2* 16.1*  NEUTROABS 8.1* 7.6 6.2 11.6* 14.2*  HGB 8.8* 8.6* 8.3* 8.4* 8.4*  HCT 29.5* 28.9* 28.3* 28.9* 28.9*   MCV 98.7 98.0 98.6 99.3 98.3  PLT 214 234 237 245 527   Basic Metabolic Panel: Recent Labs  Lab 09/21/20 0242 09/22/20 0346 09/23/20 0511 09/24/20 0356 09/25/20 0358  NA 133* 134* 135 133* 133*  K 4.1 3.6 3.5 4.0 4.4  CL 95* 97* 101 100 98  CO2 30 29 31 30 28   GLUCOSE 143* 141* 169* 225* 178*  BUN 16 11 9 8  5*  CREATININE 0.45 0.49 0.42* 0.45 0.39*  CALCIUM 8.1* 7.7* 7.9* 7.9* 8.1*  MG  --  1.8 2.1 1.9 1.9  PHOS  --  3.6 2.5 2.2* 2.7   GFR: Estimated Creatinine Clearance: 84 mL/min (A) (by C-G formula based on SCr of 0.39 mg/dL (L)). Liver Function Tests: Recent Labs  Lab 09/21/20 0242 09/22/20 0346 09/24/20 0356  AST 36 24 17  ALT 57* 45* 29  ALKPHOS 201* 150* 130*  BILITOT 0.5 0.4 <0.1*  PROT 4.9* 4.8* 4.6*  ALBUMIN 2.0* 2.0* 1.9*   No results for input(s): LIPASE, AMYLASE in the last 168 hours. No results for input(s): AMMONIA in the last 168 hours. Coagulation Profile: No results for input(s): INR, PROTIME in the last 168 hours. Cardiac Enzymes: No results for input(s): CKTOTAL, CKMB, CKMBINDEX, TROPONINI in the last 168 hours. BNP (last 3 results) No results for input(s): PROBNP in the last 8760 hours. HbA1C: No results for input(s): HGBA1C in the last 72 hours. CBG: Recent Labs  Lab 09/25/20 2319 09/26/20 0335 09/26/20 0722 09/26/20 1129 09/26/20 1557  GLUCAP 198* 211* 247* 232* 217*   Lipid Profile: Recent Labs    09/24/20 0356  TRIG 72   Thyroid Function Tests: No results for input(s): TSH, T4TOTAL, FREET4, T3FREE, THYROIDAB in the last 72 hours. Anemia Panel: No results for input(s): VITAMINB12, FOLATE, FERRITIN, TIBC, IRON, RETICCTPCT in the last 72 hours. Urine analysis:    Component Value Date/Time   COLORURINE YELLOW (A) 09/21/2020 0005   APPEARANCEUR CLOUDY (A) 09/21/2020 0005   APPEARANCEUR Clear 02/10/2018 1639   LABSPEC 1.017 09/21/2020 0005   PHURINE 9.0 (H) 09/21/2020 0005   GLUCOSEU NEGATIVE 09/21/2020 0005   HGBUR  SMALL (A) 09/21/2020 0005   BILIRUBINUR NEGATIVE 09/21/2020 0005   BILIRUBINUR Negative 02/10/2018 1639   KETONESUR NEGATIVE 09/21/2020 0005   PROTEINUR 30 (A) 09/21/2020 0005   NITRITE NEGATIVE 09/21/2020 0005   LEUKOCYTESUR LARGE (A) 09/21/2020 0005   Sepsis Labs: @LABRCNTIP (procalcitonin:4,lacticidven:4) )No results found for this or any previous visit (from the past 240 hour(s)).       Radiology Studies: CT ABDOMEN PELVIS W CONTRAST  Result Date: 09/26/2020 CLINICAL DATA:  Rectal bleeding, abdominal abscess/infection suspected EXAM: CT ABDOMEN AND PELVIS WITH CONTRAST TECHNIQUE:  Multidetector CT imaging of the abdomen and pelvis was performed using the standard protocol following bolus administration of intravenous contrast. Sagittal and coronal MPR images reconstructed from axial data set. CONTRAST:  47mL OMNIPAQUE IOHEXOL 300 MG/ML SOLN IV. Dilute oral contrast. COMPARISON:  09/20/2020 FINDINGS: Lower chest: Minimal atelectasis dependently at RIGHT lung base small amount of fluid inferior to RIGHT pulmonary hilum, question recess of pericardial fluid. Hepatobiliary: Gallbladder and liver normal appearance Pancreas: Normal appearance Spleen: Normal appearance Adrenals/Urinary Tract: Small LEFT renal cyst. Adrenal glands, kidneys, ureters, and bladder otherwise normal appearance. Stomach/Bowel: Stomach unremarkable. Normal appendix. Few scattered colonic diverticula. Portions of the lateral LEFT abdomen excluded due to body habitus, including portions of the descending colon. LEFT lower quadrant colostomy. No focal bowel abnormality seen. Vascular/Lymphatic: Atherosclerotic calcifications aorta without aneurysm. No adenopathy. Reproductive: Uterus surgically absent with nonvisualization of ovaries Other: No free air or free fluid. No hernia. Ventral midline surgical wound containing minimal fluid and gas, may be related to recent surgery, open wound, or infection. Musculoskeletal: Significant  osseous demineralization. Grade 1 anterolisthesis L4-L5 secondary to degenerative disc and facet disease. Additional degenerative disc disease changes L5-S1. IMPRESSION: Minimal colonic diverticulosis without evidence of diverticulitis. Minimal gas and fluid within ventral surgical wound, question related to surgery versus open wound or infection. No acute intra-abdominal or intrapelvic abnormalities. Aortic Atherosclerosis (ICD10-I70.0). Electronically Signed   By: Lavonia Dana M.D.   On: 09/26/2020 15:52      Scheduled Meds: . Chlorhexidine Gluconate Cloth  6 each Topical Daily  . docusate sodium  100 mg Oral BID  . gabapentin  100 mg Oral TID  . insulin aspart  0-20 Units Subcutaneous Q4H  . insulin glargine  10 Units Subcutaneous Daily  . lidocaine  1 patch Transdermal Q24H  . mouth rinse  15 mL Mouth Rinse BID  . melatonin  9 mg Oral QHS  . methylPREDNISolone (SOLU-MEDROL) injection  40 mg Intravenous Q12H  . metoprolol tartrate  5 mg Intravenous Q6H  . nystatin  5 mL Oral QID  . pantoprazole  40 mg Oral QHS   Continuous Infusions: . cefTRIAXone (ROCEPHIN)  IV 2 g (09/25/20 2257)  . heparin 950 Units/hr (09/26/20 0441)  . metronidazole Stopped (09/26/20 1237)     LOS: 28 days      Debbe Odea, MD [facility] Pager: [email or web address] 09/26/2020, 6:20 PM

## 2020-09-26 NOTE — Progress Notes (Signed)
[facility] Progress Note  25 Days Post-Op  Subjective: CC-  Patient states that she feels the same as yesterday. Continues to have mild lower abdominal pain early in the morning that resolves and does not return after pain medication. Denies any current abdominal pain. Denies n/v. Tolerating diet and colostomy productive.  WBC up 16.1, afebrile.  Objective: Vital signs in last 24 hours: Temp:  [97.7 F (36.5 C)-98.2 F (36.8 C)] 97.7 F (36.5 C) (05/11 0800) Pulse Rate:  [85-98] 85 (05/11 0800) Resp:  [16-20] 16 (05/11 0800) BP: (105-125)/(62-91) 122/91 (05/11 0800) SpO2:  [96 %-99 %] 99 % (05/11 0800) Weight:  [97.1 kg] 97.1 kg (05/11 0331) Last BM Date: 09/26/20  Intake/Output from previous day: 05/10 0701 - 05/11 0700 In: 2268.1 [P.O.:720; I.V.:1247.5; IV Piggyback:300.7] Out: 2550 [Urine:2350; Drains:200] Intake/Output this shift: Total I/O In: -  Out: 200 [Urine:200]  PE: General: Alert, NAD, pleasant Lungs: rate and effort normal Abd: soft,minimally distended, completely nontender,open midline woundpink without surrounding erythema or purulent drainage/ visible suture/ trace blood oozing from proximal aspect. Stoma with less mucocutaneous separation from about 9-10 o'clock position     Lab Results:  Recent Labs    09/25/20 0358 09/26/20 0318  WBC 13.2* 16.1*  HGB 8.4* 8.4*  HCT 28.9* 28.9*  PLT 245 242   BMET Recent Labs    09/24/20 0356 09/25/20 0358  NA 133* 133*  K 4.0 4.4  CL 100 98  CO2 30 28  GLUCOSE 225* 178*  BUN 8 5*  CREATININE 0.45 0.39*  CALCIUM 7.9* 8.1*   PT/INR No results for input(s): LABPROT, INR in the last 72 hours. CMP     Component Value Date/Time   NA 133 (L) 09/25/2020 0358   NA 129 (L) 08/06/2020 1003   K 4.4 09/25/2020 0358   CL 98 09/25/2020 0358   CO2 28 09/25/2020 0358   GLUCOSE 178 (H) 09/25/2020 0358   BUN 5 (L) 09/25/2020 0358   BUN 6 08/06/2020 1003   CREATININE 0.39 (L) 09/25/2020 0358    CREATININE 0.85 08/02/2015 1710   CALCIUM 8.1 (L) 09/25/2020 0358   PROT 4.6 (L) 09/24/2020 0356   PROT 4.9 (L) 08/06/2020 1003   ALBUMIN 1.9 (L) 09/24/2020 0356   ALBUMIN 3.2 (L) 08/06/2020 1003   AST 17 09/24/2020 0356   ALT 29 09/24/2020 0356   ALKPHOS 130 (H) 09/24/2020 0356   BILITOT <0.1 (L) 09/24/2020 0356   BILITOT 0.4 08/06/2020 1003   GFRNONAA >60 09/25/2020 0358   GFRNONAA 78 08/02/2015 1710   GFRAA >60 01/17/2020 0546   GFRAA >89 08/02/2015 1710   Lipase     Component Value Date/Time   LIPASE 64 (H) 07/23/2020 0432       Studies/Results: No results found.  Anti-infectives: Anti-infectives (From admission, onward)   Start     Dose/Rate Route Frequency Ordered Stop   09/21/20 0030  cefTRIAXone (ROCEPHIN) 2 g in sodium chloride 0.9 % 100 mL IVPB        2 g 200 mL/hr over 30 Minutes Intravenous Daily at bedtime 09/21/20 0012     09/21/20 0030  metroNIDAZOLE (FLAGYL) IVPB 500 mg        500 mg 100 mL/hr over 60 Minutes Intravenous Every 8 hours 09/21/20 0012     09/01/20 1930  ciprofloxacin (CIPRO) IVPB 400 mg        400 mg 200 mL/hr over 60 Minutes Intravenous Every 12 hours 09/01/20 1836 09/05/20 1927   09/01/20  1930  metroNIDAZOLE (FLAGYL) IVPB 500 mg        500 mg 100 mL/hr over 60 Minutes Intravenous Every 8 hours 09/01/20 1836 09/05/20 2124       Assessment/Plan Hx of HTN DM2 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  OSA ABL anemia- hgb8.4, stable PE - on heparin gtt - above per [facility]-  Rhonda [name] is a 60 year old female with a history of GI bleed s/p angioembolization of a branch of the IMA on 4/13, who developed a necrotic section of proximal sigmoid colon with perforation and feculent peritonitis S/pExploratory Laparotomy, Partial Colectomy (sigmoid), colostomy creation,Hartman's procedure on 4/16/2022by Dr. Grandville Silos - POD #23 -CT5/5 w/dilated bowel and pneumatosis -  NGTwassubsequentlyreinsertedand patient started onIV abx.Her lactic was wnl and wbc normalized. She remained NT on exam. NGT was d/c'd and diet was adv to fld. There was no indication for emergent surgical intervention.  -Monitorseparation of stoma. Stomaremains functioning and above the fascia. -Continue PT/OT, mobilize - recommending SNF - WOC following for ostomy education -Vac changes MWF - Pulm toilet  FEN -Soft,1/2TPN VTE -SCDs,heparin gtt ID -Cipro/Flagyl4/16- 4/20, rocephin/flagyl 5/6 >> Foley - None   Plan: Patient is nontender on abdominal exam, tolerating diet, and colostomy functioning. WBC is up again 16.1 from 13.2. She is afebrile. Clinically seems stable from abdominal standpoint, but with rising WBC will discuss role of repeat CT scan with MD.    LOS: 28 days    [name], [facility] 09/26/2020, 9:46 AM Please see Amion for pager number during day hours 7:00am-4:30pm

## 2020-09-26 NOTE — Plan of Care (Signed)
  Problem: Education: Goal: Knowledge of General Education information will improve Description: Including pain rating scale, medication(s)/side effects and non-pharmacologic comfort measures Outcome: Progressing   Problem: Coping: Goal: Level of anxiety will decrease Outcome: Progressing   Problem: Pain Managment: Goal: General experience of comfort will improve Outcome: Progressing   

## 2020-09-26 NOTE — Progress Notes (Signed)
[place] for Heparin  Indication: pulmonary embolus  Allergies  Allergen Reactions  . Acetaminophen Other (See Comments)    Patient states tylenol makes her sick.     Patient Measurements: Height: 5\' 3"  (160 cm) Weight: 97.1 kg (214 lb 1.1 oz) IBW/kg (Calculated) : 52.4  Vital Signs: Temp: 98.2 F (36.8 C) (05/11 0331) Temp Source: Oral (05/11 0331) BP: 118/83 (05/11 0331) Pulse Rate: 98 (05/11 0331)  Labs: Recent Labs    09/23/20 0511 09/24/20 0356 09/25/20 0358 09/26/20 0318  HGB 8.6* 8.3* 8.4* 8.4*  HCT 28.9* 28.3* 28.9* 28.9*  PLT 234 237 245 242  HEPARINUNFRC 0.42 0.34 0.32 0.21*  CREATININE 0.42* 0.45 0.39*  --     Estimated Creatinine Clearance: 84 mL/min (A) (by C-G formula based on SCr of 0.39 mg/dL (L)).  Assessment: 60 y.o. female with PE for heparin.   Goal of Therapy:  Heparin level 0.3-0.5 units/ml (at risk for hemorrhagic necrosis per MD) Monitor platelets by anticoagulation protocol: Yes   Plan:  Increase Heparin 950 units/hr Check heparin level in 8 hours.  Phillis Knack, PharmD, BCPS

## 2020-09-26 NOTE — Progress Notes (Signed)
ANTICOAGULATION CONSULT NOTE - Follow Up Consult  Pharmacy Consult for heparin Indication: pulmonary embolus  Allergies  Allergen Reactions  . Acetaminophen Other (See Comments)    Patient states tylenol makes her sick.     Patient Measurements: Height: 5\' 3"  (160 cm) Weight: 97.1 kg (214 lb 1.1 oz) IBW/kg (Calculated) : 52.4 Heparin Dosing Weight: 70kg  Vital Signs: Temp: 97.7 F (36.5 C) (05/11 1122) Temp Source: Axillary (05/11 1122) BP: 113/75 (05/11 1122) Pulse Rate: 95 (05/11 1122)  Labs: Recent Labs    09/24/20 0356 09/25/20 0358 09/26/20 0318 09/26/20 1251  HGB 8.3* 8.4* 8.4*  --   HCT 28.3* 28.9* 28.9*  --   PLT 237 245 242  --   HEPARINUNFRC 0.34 0.32 0.21* 0.44  CREATININE 0.45 0.39*  --   --     Estimated Creatinine Clearance: 84 mL/min (A) (by C-G formula based on SCr of 0.39 mg/dL (L)).   Medications:  Scheduled:  . Chlorhexidine Gluconate Cloth  6 each Topical Daily  . docusate sodium  100 mg Oral BID  . gabapentin  100 mg Oral TID  . insulin aspart  0-20 Units Subcutaneous Q4H  . insulin glargine  10 Units Subcutaneous Daily  . lidocaine  1 patch Transdermal Q24H  . mouth rinse  15 mL Mouth Rinse BID  . melatonin  9 mg Oral QHS  . methylPREDNISolone (SOLU-MEDROL) injection  40 mg Intravenous Q12H  . metoprolol tartrate  5 mg Intravenous Q6H  . nystatin  5 mL Oral QID  . pantoprazole  40 mg Oral QHS   Infusions:  . cefTRIAXone (ROCEPHIN)  IV 2 g (09/25/20 2257)  . heparin 950 Units/hr (09/26/20 0441)  . metronidazole Stopped (09/26/20 1237)  . TPN ADULT (ION) 45 mL/hr at 09/25/20 1709    Assessment: 60 yo F continues on heparin for new PE (disgnosed 5/6).  Heparin level now therapeutic on 950 units/hr.  No bleeding noted.  Goal of Therapy:  Heparin level 0.3-0.7 units/ml Monitor platelets by anticoagulation protocol: Yes   Plan:  Continue heparin at 950 units/hr Daily heparin level and CBC Follow-up plans to transition to oral  anticoagulation   [employer], Pharm.D., BCPS Clinical Pharmacist  **Pharmacist phone directory can be found on [email or web address] listed under [place].  09/26/2020 2:03 PM

## 2020-09-26 NOTE — Consult Note (Signed)
[place] Nurse wound follow up Patient receiving care in [place]. Margie Billet, PA at bedside.  Wound type:surgical Wound YIF:OYDX with some bleeding today Measurements: 15.4 cm x 4 cm x 1.8 cm  4.2 cm depth of inferior aspect of the wound Drainage (amount, consistency, odor)purulentserosanginous in canister, no odor Periwound:intact Dressing procedure/placement/frequency:two pieces of black foam removed.One piece of black foam packed into inferior aspect of the wound.One piece of narrow black foam placed into wound bed. Drape applied, immediate seal obtained. Supplies at bedside  [place] Nurse ostomy follow up Stoma type/location:LUQ colostomy Stomal assessment/size:retracted, pink, oval shaped Measures: 1 1/2" Oval Mucocutaneous separationfrom 6:00 to 12:00 improving. Peristomal assessment:intact Treatment options for stomal/peristomal skin:barrier ring and convex pouch Output: soft brown stool Ostomy pouching: 1pc.Flexible convex, [name] [phone] Education provided:none Enrolled patient in [facility] Discharge program: No Supplies at bedside.  [name], MSN, RN, [place], [name], [facility] Wound Treatment Associate Pager [phone]

## 2020-09-27 DIAGNOSIS — K631 Perforation of intestine (nontraumatic): Secondary | ICD-10-CM | POA: Diagnosis not present

## 2020-09-27 LAB — GLUCOSE, CAPILLARY
Glucose-Capillary: 135 mg/dL — ABNORMAL HIGH (ref 70–99)
Glucose-Capillary: 137 mg/dL — ABNORMAL HIGH (ref 70–99)
Glucose-Capillary: 158 mg/dL — ABNORMAL HIGH (ref 70–99)
Glucose-Capillary: 169 mg/dL — ABNORMAL HIGH (ref 70–99)
Glucose-Capillary: 193 mg/dL — ABNORMAL HIGH (ref 70–99)
Glucose-Capillary: 210 mg/dL — ABNORMAL HIGH (ref 70–99)
Glucose-Capillary: 242 mg/dL — ABNORMAL HIGH (ref 70–99)

## 2020-09-27 LAB — CBC WITH DIFFERENTIAL/PLATELET
Abs Immature Granulocytes: 2 10*3/uL — ABNORMAL HIGH (ref 0.00–0.07)
Basophils Absolute: 0 10*3/uL (ref 0.0–0.1)
Basophils Relative: 0 %
Eosinophils Absolute: 0 10*3/uL (ref 0.0–0.5)
Eosinophils Relative: 0 %
HCT: 28.5 % — ABNORMAL LOW (ref 36.0–46.0)
Hemoglobin: 8.5 g/dL — ABNORMAL LOW (ref 12.0–15.0)
Lymphocytes Relative: 0 %
Lymphs Abs: 0 10*3/uL — ABNORMAL LOW (ref 0.7–4.0)
MCH: 28.9 pg (ref 26.0–34.0)
MCHC: 29.8 g/dL — ABNORMAL LOW (ref 30.0–36.0)
MCV: 96.9 fL (ref 80.0–100.0)
Metamyelocytes Relative: 7 %
Monocytes Absolute: 0.3 10*3/uL (ref 0.1–1.0)
Monocytes Relative: 2 %
Myelocytes: 6 %
Neutro Abs: 13.3 10*3/uL — ABNORMAL HIGH (ref 1.7–7.7)
Neutrophils Relative %: 85 %
Platelets: 245 10*3/uL (ref 150–400)
RBC: 2.94 MIL/uL — ABNORMAL LOW (ref 3.87–5.11)
RDW: 18.7 % — ABNORMAL HIGH (ref 11.5–15.5)
WBC: 15.6 10*3/uL — ABNORMAL HIGH (ref 4.0–10.5)
nRBC: 3.9 % — ABNORMAL HIGH (ref 0.0–0.2)
nRBC: 8 /100 WBC — ABNORMAL HIGH

## 2020-09-27 LAB — HEPARIN LEVEL (UNFRACTIONATED): Heparin Unfractionated: 0.55 IU/mL (ref 0.30–0.70)

## 2020-09-27 LAB — SARS CORONAVIRUS 2 (TAT 6-24 HRS): SARS Coronavirus 2: NEGATIVE

## 2020-09-27 MED ORDER — GLUCERNA SHAKE PO LIQD
237.0000 mL | Freq: Two times a day (BID) | ORAL | Status: DC
  Administered 2020-09-27 – 2020-09-28 (×2): 237 mL via ORAL

## 2020-09-27 MED ORDER — PREDNISONE 20 MG PO TABS
40.0000 mg | ORAL_TABLET | Freq: Two times a day (BID) | ORAL | Status: DC
  Administered 2020-09-27 – 2020-09-28 (×3): 40 mg via ORAL
  Filled 2020-09-27 (×3): qty 2

## 2020-09-27 MED ORDER — ENSURE MAX PROTEIN PO LIQD
11.0000 [oz_av] | Freq: Every day | ORAL | Status: DC
  Filled 2020-09-27 (×2): qty 330

## 2020-09-27 MED ORDER — INSULIN GLARGINE 100 UNIT/ML ~~LOC~~ SOLN
12.0000 [IU] | Freq: Every day | SUBCUTANEOUS | Status: DC
  Filled 2020-09-27: qty 0.12

## 2020-09-27 MED ORDER — ADULT MULTIVITAMIN W/MINERALS CH
1.0000 | ORAL_TABLET | Freq: Every day | ORAL | Status: DC
  Administered 2020-09-27 – 2020-09-28 (×2): 1 via ORAL
  Filled 2020-09-27 (×2): qty 1

## 2020-09-27 MED ORDER — APIXABAN 5 MG PO TABS
10.0000 mg | ORAL_TABLET | Freq: Two times a day (BID) | ORAL | Status: DC
  Administered 2020-09-27 – 2020-09-28 (×3): 10 mg via ORAL
  Filled 2020-09-27 (×4): qty 2

## 2020-09-27 MED ORDER — APIXABAN 5 MG PO TABS: 5.0000 mg | ORAL_TABLET | Freq: Two times a day (BID) | ORAL | Status: DC

## 2020-09-27 NOTE — Progress Notes (Signed)
ANTICOAGULATION CONSULT NOTE - Follow Up Consult  Pharmacy Consult for heparin Indication: pulmonary embolus  Allergies  Allergen Reactions  . Acetaminophen Other (See Comments)    Patient states tylenol makes her sick.     Patient Measurements: Height: 5\' 3"  (160 cm) Weight: 97.1 kg (214 lb 1.1 oz) IBW/kg (Calculated) : 52.4 Heparin Dosing Weight: 70kg  Vital Signs: Temp: 97.8 F (36.6 C) (05/12 0728) Temp Source: Oral (05/12 0728) BP: 105/59 (05/12 0753) Pulse Rate: 99 (05/12 0728)  Labs: Recent Labs    09/25/20 0358 09/26/20 0318 09/26/20 1251 09/27/20 0020  HGB 8.4* 8.4*  --  8.5*  HCT 28.9* 28.9*  --  28.5*  PLT 245 242  --  245  HEPARINUNFRC 0.32 0.21* 0.44 0.55  CREATININE 0.39*  --   --   --     Estimated Creatinine Clearance: 84 mL/min (A) (by C-G formula based on SCr of 0.39 mg/dL (L)).   Medications:  Scheduled:  . Chlorhexidine Gluconate Cloth  6 each Topical Daily  . docusate sodium  100 mg Oral BID  . gabapentin  100 mg Oral TID  . insulin aspart  0-20 Units Subcutaneous TID WC  . insulin glargine  10 Units Subcutaneous Daily  . lidocaine  1 patch Transdermal Q24H  . mouth rinse  15 mL Mouth Rinse BID  . melatonin  9 mg Oral QHS  . methylPREDNISolone (SOLU-MEDROL) injection  40 mg Intravenous Q24H  . metoprolol tartrate  5 mg Intravenous Q6H  . nystatin  5 mL Oral QID  . pantoprazole  40 mg Oral QHS   Infusions:  . cefTRIAXone (ROCEPHIN)  IV 2 g (09/26/20 2121)  . heparin 950 Units/hr (09/27/20 0749)  . metronidazole 500 mg (09/27/20 0442)    Assessment: 60 yo F continues on heparin for new PE (disgnosed 5/6).  Heparin level remains therapeutic on 950 units/hr.  No bleeding noted.  Goal of Therapy:  Heparin level 0.3-0.7 units/ml Monitor platelets by anticoagulation protocol: Yes   Plan:  Continue heparin at 950 units/hr Daily heparin level and CBC Follow-up plans to transition to oral anticoagulation   [employer],  Pharm.D., BCPS Clinical Pharmacist  **Pharmacist phone directory can be found on [email or web address] listed under [place].  09/27/2020 8:56 AM

## 2020-09-27 NOTE — Progress Notes (Signed)
DBIV: Arrived to room, labs already drawn by phlebotomy. Noted heparin was infusing via PICC, discussed with RN. PIV site assessed/ heparin moved to PIV. PICC flushed, site unremarkable.

## 2020-09-27 NOTE — Progress Notes (Signed)
PROGRESS NOTE    Rhonda [name]   GLO:[id number]  DOB: May 07, 1961  DOA: 08/29/2020 PCP: [place].   Brief Narrative:  Rhonda [name] 60 year old with past medical history significant for hypertension, obesity, obstructive sleep apnea not using CPAP, neurosarcoidosis on chronic prednisone, wheelchair-bound due to chronic debility, recurrent GI bleeding with recent multiple hospitalization presented back on 4/13 to the emergency room with frank rectal bleeding and hypotension.   Subjective: No complaints today.     Assessment & Plan:   Principal Problem:  Perforated sigmoid colon IMA embolization 4/13.  Emergency exploratory laparotomy and Hartman's procedure 4/16 - management per general surgery - CT repeated due to rising WBC count- no signs of infection noted - elevated WBC count may be related to steroids which I am weaning - wound vac being changed MWF - on Rocephin and Flagyl until tomorrow  Active Problems:   GI bleed - Hb stable  Acute PE - on Heparin infusion - can switch to oral per Alferd Apa who I have spoken with today- switch to Eliquis   Neurosarcoidosis - wheelchair bound - Has been on Solumedrol since 5/6 @ 40 mg BID due to surgery and ileus- weaned to 40 mg daily-  - will transition back to Prednisone 40 mg BID tomorrow   DM2 - cont Lantus and Novolog- changed Novolog to [facility]   Time spent in minutes: 40 DVT prophylaxis: SCDs Start: 08/29/20 2256 Code Status: Full code Family Communication:  Level of Care: Level of care: Progressive Disposition Plan:  Status is: Inpatient  Remains inpatient appropriate because:IV treatments appropriate due to intensity of illness or inability to take PO   Dispo: The patient is from: Home              Anticipated d/c is to: SNF              Patient currently is not medically stable to d/c.   Difficult to place patient No      Consultants:   General surgery Procedures:    Embolization inferior mesenteric artery 4/13  Hartman's procedure 4/16.  Antimicrobials:  Anti-infectives (From admission, onward)   Start     Dose/Rate Route Frequency Ordered Stop   09/21/20 0030  cefTRIAXone (ROCEPHIN) 2 g in sodium chloride 0.9 % 100 mL IVPB        2 g 200 mL/hr over 30 Minutes Intravenous Daily at bedtime 09/21/20 0012     09/21/20 0030  metroNIDAZOLE (FLAGYL) IVPB 500 mg        500 mg 100 mL/hr over 60 Minutes Intravenous Every 8 hours 09/21/20 0012     09/01/20 1930  ciprofloxacin (CIPRO) IVPB 400 mg        400 mg 200 mL/hr over 60 Minutes Intravenous Every 12 hours 09/01/20 1836 09/05/20 1927   09/01/20 1930  metroNIDAZOLE (FLAGYL) IVPB 500 mg        500 mg 100 mL/hr over 60 Minutes Intravenous Every 8 hours 09/01/20 1836 09/05/20 2124       Objective: Vitals:   09/27/20 0400 09/27/20 0728 09/27/20 0753 09/27/20 1203  BP: 109/77 (!) 81/61 (!) 105/59 104/76  Pulse: 88 99  97  Resp: 18 17  19   Temp: 98 F (36.7 C) 97.8 F (36.6 C)  98.4 F (36.9 C)  TempSrc: Axillary Oral  Oral  SpO2: 98% 99%  90%  Weight:      Height:        Intake/Output Summary (Last  24 hours) at 09/27/2020 1454 Last data filed at 09/27/2020 1343 Gross per 24 hour  Intake 2218.76 ml  Output 2775 ml  Net -556.24 ml   Filed Weights   09/23/20 0424 09/24/20 0342 09/26/20 0331  Weight: 81.9 kg 94 kg 97.1 kg    Examination: General exam: Appears comfortable  HEENT: PERRLA, oral mucosa moist, no sclera icterus or thrush Respiratory system: Clear to auscultation. Respiratory effort normal. Cardiovascular system: S1 & S2 heard, regular rate and rhythm Gastrointestinal system: Abdomen soft,  nondistended. Normal bowel sounds - wound vac present - colostomy noted  Central nervous system: Alert and oriented. No focal neurological deficits. Extremities: No cyanosis, clubbing or edema Skin: No rashes or ulcers Psychiatry:  Mood & affect appropriate.      Data Reviewed: I  have personally reviewed following labs and imaging studies  CBC: Recent Labs  Lab 09/23/20 0511 09/24/20 0356 09/25/20 0358 09/26/20 0318 09/27/20 0020  WBC 10.1 9.3 13.2* 16.1* 15.6*  NEUTROABS 7.6 6.2 11.6* 14.2* 13.3*  HGB 8.6* 8.3* 8.4* 8.4* 8.5*  HCT 28.9* 28.3* 28.9* 28.9* 28.5*  MCV 98.0 98.6 99.3 98.3 96.9  PLT 234 237 245 242 563   Basic Metabolic Panel: Recent Labs  Lab 09/21/20 0242 09/22/20 0346 09/23/20 0511 09/24/20 0356 09/25/20 0358  NA 133* 134* 135 133* 133*  K 4.1 3.6 3.5 4.0 4.4  CL 95* 97* 101 100 98  CO2 30 29 31 30 28   GLUCOSE 143* 141* 169* 225* 178*  BUN 16 11 9 8  5*  CREATININE 0.45 0.49 0.42* 0.45 0.39*  CALCIUM 8.1* 7.7* 7.9* 7.9* 8.1*  MG  --  1.8 2.1 1.9 1.9  PHOS  --  3.6 2.5 2.2* 2.7   GFR: Estimated Creatinine Clearance: 84 mL/min (A) (by C-G formula based on SCr of 0.39 mg/dL (L)). Liver Function Tests: Recent Labs  Lab 09/21/20 0242 09/22/20 0346 09/24/20 0356  AST 36 24 17  ALT 57* 45* 29  ALKPHOS 201* 150* 130*  BILITOT 0.5 0.4 <0.1*  PROT 4.9* 4.8* 4.6*  ALBUMIN 2.0* 2.0* 1.9*   No results for input(s): LIPASE, AMYLASE in the last 168 hours. No results for input(s): AMMONIA in the last 168 hours. Coagulation Profile: No results for input(s): INR, PROTIME in the last 168 hours. Cardiac Enzymes: No results for input(s): CKTOTAL, CKMB, CKMBINDEX, TROPONINI in the last 168 hours. BNP (last 3 results) No results for input(s): PROBNP in the last 8760 hours. HbA1C: No results for input(s): HGBA1C in the last 72 hours. CBG: Recent Labs  Lab 09/26/20 2104 09/27/20 0031 09/27/20 0444 09/27/20 0724 09/27/20 1208  GLUCAP 201* 169* 210* 193* 242*   Lipid Profile: No results for input(s): CHOL, HDL, LDLCALC, TRIG, CHOLHDL, LDLDIRECT in the last 72 hours. Thyroid Function Tests: No results for input(s): TSH, T4TOTAL, FREET4, T3FREE, THYROIDAB in the last 72 hours. Anemia Panel: No results for input(s): VITAMINB12,  FOLATE, FERRITIN, TIBC, IRON, RETICCTPCT in the last 72 hours. Urine analysis:    Component Value Date/Time   COLORURINE YELLOW (A) 09/21/2020 0005   APPEARANCEUR CLOUDY (A) 09/21/2020 0005   APPEARANCEUR Clear 02/10/2018 1639   LABSPEC 1.017 09/21/2020 0005   PHURINE 9.0 (H) 09/21/2020 0005   GLUCOSEU NEGATIVE 09/21/2020 0005   HGBUR SMALL (A) 09/21/2020 0005   BILIRUBINUR NEGATIVE 09/21/2020 0005   BILIRUBINUR Negative 02/10/2018 1639   KETONESUR NEGATIVE 09/21/2020 0005   PROTEINUR 30 (A) 09/21/2020 0005   NITRITE NEGATIVE 09/21/2020 0005   LEUKOCYTESUR LARGE (A)  09/21/2020 0005   Sepsis Labs: @LABRCNTIP (procalcitonin:4,lacticidven:4) )No results found for this or any previous visit (from the past 240 hour(s)).       Radiology Studies: CT ABDOMEN PELVIS W CONTRAST  Result Date: 09/26/2020 CLINICAL DATA:  Rectal bleeding, abdominal abscess/infection suspected EXAM: CT ABDOMEN AND PELVIS WITH CONTRAST TECHNIQUE: Multidetector CT imaging of the abdomen and pelvis was performed using the standard protocol following bolus administration of intravenous contrast. Sagittal and coronal MPR images reconstructed from axial data set. CONTRAST:  64mL OMNIPAQUE IOHEXOL 300 MG/ML SOLN IV. Dilute oral contrast. COMPARISON:  09/20/2020 FINDINGS: Lower chest: Minimal atelectasis dependently at RIGHT lung base small amount of fluid inferior to RIGHT pulmonary hilum, question recess of pericardial fluid. Hepatobiliary: Gallbladder and liver normal appearance Pancreas: Normal appearance Spleen: Normal appearance Adrenals/Urinary Tract: Small LEFT renal cyst. Adrenal glands, kidneys, ureters, and bladder otherwise normal appearance. Stomach/Bowel: Stomach unremarkable. Normal appendix. Few scattered colonic diverticula. Portions of the lateral LEFT abdomen excluded due to body habitus, including portions of the descending colon. LEFT lower quadrant colostomy. No focal bowel abnormality seen.  Vascular/Lymphatic: Atherosclerotic calcifications aorta without aneurysm. No adenopathy. Reproductive: Uterus surgically absent with nonvisualization of ovaries Other: No free air or free fluid. No hernia. Ventral midline surgical wound containing minimal fluid and gas, may be related to recent surgery, open wound, or infection. Musculoskeletal: Significant osseous demineralization. Grade 1 anterolisthesis L4-L5 secondary to degenerative disc and facet disease. Additional degenerative disc disease changes L5-S1. IMPRESSION: Minimal colonic diverticulosis without evidence of diverticulitis. Minimal gas and fluid within ventral surgical wound, question related to surgery versus open wound or infection. No acute intra-abdominal or intrapelvic abnormalities. Aortic Atherosclerosis (ICD10-I70.0). Electronically Signed   By: Lavonia Dana M.D.   On: 09/26/2020 15:52      Scheduled Meds: . Chlorhexidine Gluconate Cloth  6 each Topical Daily  . docusate sodium  100 mg Oral BID  . feeding supplement (GLUCERNA SHAKE)  237 mL Oral BID BM  . gabapentin  100 mg Oral TID  . insulin aspart  0-20 Units Subcutaneous TID WC  . insulin glargine  10 Units Subcutaneous Daily  . lidocaine  1 patch Transdermal Q24H  . mouth rinse  15 mL Mouth Rinse BID  . melatonin  9 mg Oral QHS  . methylPREDNISolone (SOLU-MEDROL) injection  40 mg Intravenous Q24H  . metoprolol tartrate  5 mg Intravenous Q6H  . multivitamin with minerals  1 tablet Oral Daily  . nystatin  5 mL Oral QID  . pantoprazole  40 mg Oral QHS  . Ensure Max Protein  11 oz Oral QHS   Continuous Infusions: . cefTRIAXone (ROCEPHIN)  IV 2 g (09/26/20 2121)  . heparin 950 Units/hr (09/27/20 0749)  . metronidazole Stopped (09/27/20 1315)     LOS: 29 days      Debbe Odea, MD [facility] Pager: [email or web address] 09/27/2020, 2:54 PM

## 2020-09-27 NOTE — Progress Notes (Signed)
ANTICOAGULATION CONSULT NOTE - Follow Up Consult  Pharmacy Consult for heparin to apixaban Indication: pulmonary embolus  Allergies  Allergen Reactions  . Acetaminophen Other (See Comments)    Patient states tylenol makes her sick.     Patient Measurements: Height: 5\' 3"  (160 cm) Weight: 97.1 kg (214 lb 1.1 oz) IBW/kg (Calculated) : 52.4 Heparin Dosing Weight: 70kg  Vital Signs: Temp: 98.4 F (36.9 C) (05/12 1203) Temp Source: Oral (05/12 1203) BP: 104/76 (05/12 1203) Pulse Rate: 97 (05/12 1203)  Labs: Recent Labs    09/25/20 0358 09/26/20 0318 09/26/20 1251 09/27/20 0020  HGB 8.4* 8.4*  --  8.5*  HCT 28.9* 28.9*  --  28.5*  PLT 245 242  --  245  HEPARINUNFRC 0.32 0.21* 0.44 0.55  CREATININE 0.39*  --   --   --     Estimated Creatinine Clearance: 84 mL/min (A) (by C-G formula based on SCr of 0.39 mg/dL (L)).   Medications:  Scheduled:  . Chlorhexidine Gluconate Cloth  6 each Topical Daily  . docusate sodium  100 mg Oral BID  . feeding supplement (GLUCERNA SHAKE)  237 mL Oral BID BM  . gabapentin  100 mg Oral TID  . insulin aspart  0-20 Units Subcutaneous TID WC  . [START ON 09/28/2020] insulin glargine  12 Units Subcutaneous QHS  . lidocaine  1 patch Transdermal Q24H  . mouth rinse  15 mL Mouth Rinse BID  . melatonin  9 mg Oral QHS  . metoprolol tartrate  5 mg Intravenous Q6H  . multivitamin with minerals  1 tablet Oral Daily  . nystatin  5 mL Oral QID  . pantoprazole  40 mg Oral QHS  . predniSONE  40 mg Oral BID WC  . Ensure Max Protein  11 oz Oral QHS   Infusions:  . cefTRIAXone (ROCEPHIN)  IV 2 g (09/26/20 2121)  . heparin 950 Units/hr (09/27/20 0749)  . metronidazole Stopped (09/27/20 1315)    Assessment: 60 yo F continues on heparin for new PE (disgnosed 5/6).  Heparin level remains therapeutic on 950 units/hr.  No bleeding noted.  Pharmacy asked to transition to apixaban. H/H has remained stable.  Goal of Therapy:  Heparin level 0.3-0.7  units/ml Monitor platelets by anticoagulation protocol: Yes   Plan:  Stop heparin Apixaban 10mg  BID x7 days then 5mg  BID Watch closely for S/Sx bleeding   Arrie Senate, PharmD, BCPS, [facility] Clinical Pharmacist [phone] Please check AMION for all [place] numbers 09/27/2020

## 2020-09-27 NOTE — Progress Notes (Signed)
26 Days Post-Op  Subjective: CC: Patient reports no current abdominal pain. She tolerated diet yesterday and finished most of her spaghetti for dinner w/o n/v. Husband reports possible abdominal pain last night but patient is unsure of this and reports she went to bed early and does not remember having any abdominal pain. Continues to have colostomy output.   Objective: Vital signs in last 24 hours: Temp:  [97.7 F (36.5 C)-98.6 F (37 C)] 97.8 F (36.6 C) (05/12 0728) Pulse Rate:  [88-99] 99 (05/12 0728) Resp:  [16-20] 17 (05/12 0728) BP: (81-113)/(59-79) 105/59 (05/12 0753) SpO2:  [98 %-100 %] 99 % (05/12 0728) Last BM Date: 09/26/20  Intake/Output from previous day: 05/11 0701 - 05/12 0700 In: 2360.6 [P.O.:600; I.V.:1360.5; IV Piggyback:400.1] Out: 2525 [Urine:2325; Drains:200] Intake/Output this shift: Total I/O In: 240 [P.O.:240] Out: -   PE: General: pleasant, WD,obesefemale who is laying in bed in NAD Heart:Reg Lungs: CTA b/l. Respiratory effort nonlabored Abd: soft,minimally distended, NT,midline woundwith wound vac in place. Colostomy bag with stool in bag. Unable to visualize stoma2/2 stool. +BS.  Lab Results:  Recent Labs    09/26/20 0318 09/27/20 0020  WBC 16.1* 15.6*  HGB 8.4* 8.5*  HCT 28.9* 28.5*  PLT 242 245   BMET Recent Labs    09/25/20 0358  NA 133*  K 4.4  CL 98  CO2 28  GLUCOSE 178*  BUN 5*  CREATININE 0.39*  CALCIUM 8.1*   PT/INR No results for input(s): LABPROT, INR in the last 72 hours. CMP     Component Value Date/Time   NA 133 (L) 09/25/2020 0358   NA 129 (L) 08/06/2020 1003   K 4.4 09/25/2020 0358   CL 98 09/25/2020 0358   CO2 28 09/25/2020 0358   GLUCOSE 178 (H) 09/25/2020 0358   BUN 5 (L) 09/25/2020 0358   BUN 6 08/06/2020 1003   CREATININE 0.39 (L) 09/25/2020 0358   CREATININE 0.85 08/02/2015 1710   CALCIUM 8.1 (L) 09/25/2020 0358   PROT 4.6 (L) 09/24/2020 0356   PROT 4.9 (L) 08/06/2020 1003   ALBUMIN  1.9 (L) 09/24/2020 0356   ALBUMIN 3.2 (L) 08/06/2020 1003   AST 17 09/24/2020 0356   ALT 29 09/24/2020 0356   ALKPHOS 130 (H) 09/24/2020 0356   BILITOT <0.1 (L) 09/24/2020 0356   BILITOT 0.4 08/06/2020 1003   GFRNONAA >60 09/25/2020 0358   GFRNONAA 78 08/02/2015 1710   GFRAA >60 01/17/2020 0546   GFRAA >89 08/02/2015 1710   Lipase     Component Value Date/Time   LIPASE 64 (H) 07/23/2020 0432       Studies/Results: CT ABDOMEN PELVIS W CONTRAST  Result Date: 09/26/2020 CLINICAL DATA:  Rectal bleeding, abdominal abscess/infection suspected EXAM: CT ABDOMEN AND PELVIS WITH CONTRAST TECHNIQUE: Multidetector CT imaging of the abdomen and pelvis was performed using the standard protocol following bolus administration of intravenous contrast. Sagittal and coronal MPR images reconstructed from axial data set. CONTRAST:  52mL OMNIPAQUE IOHEXOL 300 MG/ML SOLN IV. Dilute oral contrast. COMPARISON:  09/20/2020 FINDINGS: Lower chest: Minimal atelectasis dependently at RIGHT lung base small amount of fluid inferior to RIGHT pulmonary hilum, question recess of pericardial fluid. Hepatobiliary: Gallbladder and liver normal appearance Pancreas: Normal appearance Spleen: Normal appearance Adrenals/Urinary Tract: Small LEFT renal cyst. Adrenal glands, kidneys, ureters, and bladder otherwise normal appearance. Stomach/Bowel: Stomach unremarkable. Normal appendix. Few scattered colonic diverticula. Portions of the lateral LEFT abdomen excluded due to body habitus, including portions of the descending colon.  LEFT lower quadrant colostomy. No focal bowel abnormality seen. Vascular/Lymphatic: Atherosclerotic calcifications aorta without aneurysm. No adenopathy. Reproductive: Uterus surgically absent with nonvisualization of ovaries Other: No free air or free fluid. No hernia. Ventral midline surgical wound containing minimal fluid and gas, may be related to recent surgery, open wound, or infection. Musculoskeletal:  Significant osseous demineralization. Grade 1 anterolisthesis L4-L5 secondary to degenerative disc and facet disease. Additional degenerative disc disease changes L5-S1. IMPRESSION: Minimal colonic diverticulosis without evidence of diverticulitis. Minimal gas and fluid within ventral surgical wound, question related to surgery versus open wound or infection. No acute intra-abdominal or intrapelvic abnormalities. Aortic Atherosclerosis (ICD10-I70.0). Electronically Signed   By: Lavonia Dana M.D.   On: 09/26/2020 15:52    Anti-infectives: Anti-infectives (From admission, onward)   Start     Dose/Rate Route Frequency Ordered Stop   09/21/20 0030  cefTRIAXone (ROCEPHIN) 2 g in sodium chloride 0.9 % 100 mL IVPB        2 g 200 mL/hr over 30 Minutes Intravenous Daily at bedtime 09/21/20 0012     09/21/20 0030  metroNIDAZOLE (FLAGYL) IVPB 500 mg        500 mg 100 mL/hr over 60 Minutes Intravenous Every 8 hours 09/21/20 0012     09/01/20 1930  ciprofloxacin (CIPRO) IVPB 400 mg        400 mg 200 mL/hr over 60 Minutes Intravenous Every 12 hours 09/01/20 1836 09/05/20 1927   09/01/20 1930  metroNIDAZOLE (FLAGYL) IVPB 500 mg        500 mg 100 mL/hr over 60 Minutes Intravenous Every 8 hours 09/01/20 1836 09/05/20 2124       Assessment/Plan Hx of HTN DM2 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  OSA ABL anemia- hgb8.5, stable PE - on heparin gtt - above per [facility]-  Ms. Gunnoe is a 60 year old female with a history of GI bleed s/p angioembolization of a branch of the IMA on 4/13, who developed a necrotic section of proximal sigmoid colon with perforation and feculent peritonitis S/pExploratory Laparotomy, Partial Colectomy (sigmoid), colostomy creation,Hartman's procedure on 4/16/2022by Dr. Grandville Silos - POD #24 -CT5/5 w/dilated bowel and pneumatosis - NGTwassubsequentlyreinsertedand patient started onIV abx.Her lactic was wnl and wbc normalized.  She remained NT on exam. Since that time, NGT was d/c'd and diet has been advanced and tolerated. TPN off. Repeat CT 5/11 w/ no acute intra-abdominal or intrapelvic abnormalities. Cont abx through 5/13 am and then can d/c.  -Monitorseparation of stoma. Stomaremains functioning and above the fascia. -Continue PT/OT, mobilize - recommending SNF. Suspect she will be okay for d/c to SNF in am from our standpoint  - WOC following for ostomy education -Vac changes MWF - Pulm toilet - Discussed case with TRH in person  FEN -Soft,TPN off VTE -SCDs,heparin gtt (can transition to oral agent) ID -Cipro/Flagyl4/16- 4/20, rocephin/flagyl 5/6 - 5/13. Suspect elevated wbc from steroids  Foley - None     LOS: 29 days    Jillyn Ledger , [facility] 09/27/2020, 9:19 AM Please see Amion for pager number during day hours 7:00am-4:30pm

## 2020-09-27 NOTE — Progress Notes (Signed)
Physical Therapy Treatment Patient Details Name: Rhonda [name] MRN: [id number] DOB: 1961-04-05 Today's Date: 09/27/2020    History of Present Illness 60 yo female presented to [facility] on 08/29/20 with rectal bleeding (several recent admissions for similar). Pt admitted with hemorrhagic shock.  She is s/p IR for coil embolization of intraluminal extravasation for a distal arcade branch of the IMA on 08/29/20.  She developed abdominal pain and found to have SBO with bowel perforation s/p emergent exp lap, partial colectomy, colostomy, Hartman's and wound vac application 5/62/56.  Medical hx inlcudes DM2, seizures, HTN, DDD, recurrent GIB.  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    PT Comments    Patient received in bed, very pleasant and cooperative with therapies. Doing better with mobility but still requires +2 assist to stand in stedy- however, tolerated many more stands than past sessions today! Needs lots of verbal encouragement and cuing for form and posture, however performed one stand from bed and 4 mini-stands from elevated pedals of stedy. Fatigued at EOS, HR to as much as 130BPM with activity but recovered well. Left up in recliner with all needs met, spouse present. RN reports she is likely discharging to SNF tomorrow.     Follow Up Recommendations  SNF     Equipment Recommendations  None recommended by PT    Recommendations for Other Services       Precautions / Restrictions Precautions Precautions: Fall Precaution Comments: Abdomen incision with wound vac, ostomy Restrictions Weight Bearing Restrictions: No    Mobility  Bed Mobility Overal bed mobility: Needs Assistance Bed Mobility: Rolling;Sidelying to Sit Rolling: Mod assist Sidelying to sit: Mod assist       General bed mobility comments: cues for technique, assist to flex opposite knee, assist to roll, for LEs over EOB and to raise trunk, pt able to move hips to EOB     Transfers Overall transfer level: Needs assistance Equipment used: Ambulation equipment used Transfers: Sit to/from Stand Sit to Stand: +2 physical assistance;Min assist         General transfer comment: assist to rise from elevated bed and from stedy platforms  Ambulation/Gait             General Gait Details: unable- fatigue   Stairs             Wheelchair Mobility    Modified Rankin (Stroke Patients Only)       Balance Overall balance assessment: Needs assistance Sitting-balance support: Bilateral upper extremity supported Sitting balance-[name]: Fair Sitting balance - Comments: close supervision   Standing balance support: Bilateral upper extremity supported Standing balance-[name]: Zero Standing balance comment: pt unable to stand in stedy, flexes trunk, with facilitation of hip and knee extension                            Cognition Arousal/Alertness: Awake/alert Behavior During Therapy: WFL for tasks assessed/performed;Flat affect Overall Cognitive Status: History of cognitive impairments - at baseline                                 General Comments: seems at baseline cognitively, very pleasant today and on and off singing during session. Easily frustrated.      Exercises General Exercises - Upper Extremity Shoulder Flexion: AROM;10 reps;Seated;Both Elbow Flexion: AROM;Both;10 reps;Seated Elbow Extension: AROM;Both;10 reps;Seated  General Comments        Pertinent Vitals/Pain Pain Assessment: Faces Faces Pain Scale: Hurts little more Pain Location: knees in Stedy, R shoulder with AROM Pain Descriptors / Indicators: Discomfort;Grimacing Pain Intervention(s): Limited activity within patient's tolerance;Repositioned    Home Living                      Prior Function            PT Goals (current goals can now be found in the care plan section) Acute Rehab PT Goals Patient Stated  Goal: to get her legs stronger PT Goal Formulation: With patient/family Time For Goal Achievement: 10/01/20 Potential to Achieve Goals: Good Progress towards PT goals: Progressing toward goals    Frequency    Min 2X/week      PT Plan Current plan remains appropriate    Co-evaluation   Reason for Co-Treatment: For patient/therapist safety;To address functional/ADL transfers   OT goals addressed during session: Strengthening/ROM      AM-PAC PT "6 Clicks" Mobility   Outcome Measure  Help needed turning from your back to your side while in a flat bed without using bedrails?: A Lot Help needed moving from lying on your back to sitting on the side of a flat bed without using bedrails?: A Lot Help needed moving to and from a bed to a chair (including a wheelchair)?: Total Help needed standing up from a chair using your arms (e.g., wheelchair or bedside chair)?: Total Help needed to walk in hospital room?: Total Help needed climbing 3-5 steps with a railing? : Total 6 Click Score: 8    End of Session   Activity Tolerance: Patient tolerated treatment well Patient left: in chair;with call bell/phone within reach;with family/visitor present;with chair alarm set Nurse Communication: Mobility status;Need for lift equipment PT Visit Diagnosis: Other abnormalities of gait and mobility (R26.89);Muscle weakness (generalized) (M62.81);Pain Pain - part of body:  (abdomen and back)     Time: 3875-6433 PT Time Calculation (min) (ACUTE ONLY): 34 min  Charges:  $Therapeutic Activity: 8-22 mins Co-tx with OT                     Windell Norfolk, DPT, PN1   Supplemental Physical Therapist [place]    Pager [phone] Acute Rehab Office [phone]

## 2020-09-27 NOTE — Progress Notes (Signed)
Nutrition Follow-up  DOCUMENTATION CODES:   Not applicable  INTERVENTION:   -Glucerna Shake po BID, each supplement provides 220 kcal and 10 grams of protein -Ensure Max po daily, each supplement provides 150 kcal and 30 grams of protein -MVI with minerals daily -Magic cup BID with meals, each supplement provides 290 kcal and 9 grams of protein  NUTRITION DIAGNOSIS:   Inadequate oral intake related to altered GI function as evidenced by other (comment) (NPO/Clear liquid diet).  Progressing; advanced to soft diet on 09/25/20  GOAL:   Patient will meet greater than or equal to 90% of their needs  Progressing   MONITOR:   PO intake,Supplement acceptance,Diet advancement,Labs,Skin,I & O's  REASON FOR ASSESSMENT:   NPO/Clear Liquid Diet,Consult New TPN/TNA  ASSESSMENT:   60 yo female admitted with frank rectal bleeding and hypotension. 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  4/13 - S/P coil embolization of the distal Arcade branch of the superior sigmoidal artery. 4/16 - Developed severe abdominal pain. CT showed visceral perforation. S/P emergent ex lap, found to have necrotic sigmoid colon. 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 5/4- d/c TPN 5/6- vomiting episode; s/p KUB- revealed SBO and possible bowel infarction; NPO; NGT replaced; connected to low, intermittent suction 5/7- TPN resumed, NGT clamped 5/8- NGT d/c, advanced to clear liquid diet 5/9- decrease TPN to half, advanced to full liquid diet 5/10- advanced to soft diet 5/11- TPN d/c  Reviewed I/O's: -164 ml x 24 hours and -16.5 L since 09/13/20  UOP: 2.3 L x 24 hours  Drain output: 200 ml x 24 hours  Pt unavailable at time of visit. Attempted to speak with pt via call to hospital room phone, however, unable to reach.   Pt with fair appetite. Noted meal completion 50%. Due to  increased needs for post-operative healing, would benefit from addition of oral nutrition supplements.   Medications reviewed and include colace.  Per TOC notes, pt awaiting insurance authorization for SNF placement.   Labs reviewed: CBGS: [id number] (inpatient orders for glycemic control are 0-20 units insulin aspart TID with meals and 10 units insulin glargine daily).  Diet Order:   Diet Order            DIET SOFT Room service appropriate? Yes; Fluid consistency: Thin  Diet effective now                 EDUCATION NEEDS:   Not appropriate for education at this time  Skin:  Skin Assessment: Skin Integrity Issues: Skin Integrity Issues:: Other (Comment),Incisions,Wound VAC Wound Vac: abdominal incision Incisions: R chest; abdomen Other: skin tear R thigh  Last BM:  09/27/20 (200 ml via colostomy)  Height:   Ht Readings from Last 1 Encounters:  08/30/20 5\' 3"  (1.6 m)    Weight:   Wt Readings from Last 1 Encounters:  09/26/20 97.1 kg    Ideal Body Weight:  52.3 kg  BMI:  Body mass index is 37.92 kg/m.  Estimated Nutritional Needs:   Kcal:  1900-2100  Protein:  110-125 gm  Fluid:  >/= 2 L    Loistine Chance, RD, LDN, [place] Registered Dietitian II Certified Diabetes Care and Education Specialist Please refer to [facility] for RD and/or RD on-call/weekend/after hours pager

## 2020-09-27 NOTE — Progress Notes (Signed)
Occupational Therapy Treatment Patient Details Name: Rhonda [name] MRN: [id number] DOB: 02-12-1961 Today's Date: 09/27/2020    History of present illness 60 yo female presented to [facility] on 08/29/20 with rectal bleeding (several recent admissions for similar). Pt admitted with hemorrhagic shock.  She is s/p IR for coil embolization of intraluminal extravasation for a distal arcade branch of the IMA on 08/29/20.  She developed abdominal pain and found to have SBO with bowel perforation s/p emergent exp lap, partial colectomy, colostomy, Hartman's and wound vac application 06/20/52.  Medical hx inlcudes DM2, seizures, HTN, DDD, recurrent GIB.  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   OT comments  Pt requiring mod assist for bed mobility, +2 min assist stand from elevated bed and stedy platform for a total of 4 repetitions. Pt with decreased ability to maintain upright trunk in stedy or with attempts to pull to stand. Completed UB exercises once positioned in chair. HR to 125 with activity. Pt continues to be appropriate for SNF level rehab.    SNF;Supervision/Assistance - 24 hour    Equipment Recommendations  Hospital bed    Recommendations for Other Services      Precautions / Restrictions Precautions Precautions: Fall Precaution Comments: Abdomen incision with wound vac, ostomy       Mobility Bed Mobility Overal bed mobility: Needs Assistance Bed Mobility: Rolling;Sidelying to Sit Rolling: Mod assist Sidelying to sit: Mod assist       General bed mobility comments: cues for technique, assist to flex opposite knee, assist to roll, for LEs over EOB and to raise trunk, pt able to move hips to EOB    Transfers Overall transfer level: Needs assistance Equipment used: Ambulation equipment used Transfers: Sit to/from Stand Sit to Stand: +2 physical assistance;Min assist         General transfer comment: assist to rise from elevated bed  and from stedy platforms    Balance Overall balance assessment: Needs assistance Sitting-balance support: Bilateral upper extremity supported Sitting balance-[name]: Fair Sitting balance - Comments: close supervision     Standing balance-[name]: Zero Standing balance comment: pt unable to stand in stedy, flexes trunk, with facilitation of hip and knee extension                           ADL either performed or assessed with clinical judgement   ADL                                               Vision       Perception     Praxis      Cognition Arousal/Alertness: Awake/alert Behavior During Therapy: WFL for tasks assessed/performed;Flat affect Overall Cognitive Status: History of cognitive impairments - at baseline                                          Exercises Exercises: General Upper Extremity General Exercises - Upper Extremity Shoulder Flexion: AROM;10 reps;Seated;Both Elbow Flexion: AROM;Both;10 reps;Seated Elbow Extension: AROM;Both;10 reps;Seated   Shoulder Instructions       General Comments      Pertinent Vitals/ Pain       Pain Assessment: Faces Faces Pain Scale:  Hurts little more Pain Location: knees in Stedy, R shoulder with AROM Pain Descriptors / Indicators: Discomfort;Grimacing Pain Intervention(s): Monitored during session;Repositioned  Home Living                                          Prior Functioning/Environment              Frequency  Min 2X/week        Progress Toward Goals  OT Goals(current goals can now be found in the care plan section)  Progress towards OT goals: Progressing toward goals  Acute Rehab OT Goals Patient Stated Goal: to get her legs stronger OT Goal Formulation: With patient Time For Goal Achievement: 10/02/20 Potential to Achieve Goals: [place] Discharge plan remains appropriate;Frequency remains appropriate     Co-evaluation    PT/OT/SLP Co-Evaluation/Treatment: Yes Reason for Co-Treatment: For patient/therapist safety   OT goals addressed during session: Strengthening/ROM      AM-PAC OT "6 Clicks" Daily Activity     Outcome Measure   Help from another person eating meals?: A Little Help from another person taking care of personal grooming?: A Little Help from another person toileting, which includes using toliet, bedpan, or urinal?: A Lot Help from another person bathing (including washing, rinsing, drying)?: A Lot Help from another person to put on and taking off regular upper body clothing?: A Lot Help from another person to put on and taking off regular lower body clothing?: Total 6 Click Score: 13    End of Session    OT Visit Diagnosis: Unsteadiness on feet (R26.81);Other abnormalities of gait and mobility (R26.89);Muscle weakness (generalized) (M62.81);Other symptoms and signs involving cognitive function;Pain   Activity Tolerance Patient limited by fatigue   Patient Left in chair;with call bell/phone within reach;with family/visitor present   Nurse Communication Mobility status        Time: 8110-3159 OT Time Calculation (min): 37 min  Charges: OT General Charges $OT Visit: 1 Visit OT Treatments $Therapeutic Activity: 8-22 mins  Nestor Lewandowsky, OTR/L Acute Rehabilitation Services Pager: [phone] Office: [phone]   Malka So 09/27/2020, 3:24 PM

## 2020-09-28 DIAGNOSIS — Z4659 Encounter for fitting and adjustment of other gastrointestinal appliance and device: Secondary | ICD-10-CM

## 2020-09-28 DIAGNOSIS — N179 Acute kidney failure, unspecified: Secondary | ICD-10-CM | POA: Diagnosis not present

## 2020-09-28 DIAGNOSIS — K922 Gastrointestinal hemorrhage, unspecified: Secondary | ICD-10-CM | POA: Diagnosis not present

## 2020-09-28 DIAGNOSIS — K631 Perforation of intestine (nontraumatic): Secondary | ICD-10-CM | POA: Diagnosis not present

## 2020-09-28 DIAGNOSIS — Z0189 Encounter for other specified special examinations: Secondary | ICD-10-CM | POA: Diagnosis not present

## 2020-09-28 LAB — CBC WITH DIFFERENTIAL/PLATELET
Abs Immature Granulocytes: 2.16 10*3/uL — ABNORMAL HIGH (ref 0.00–0.07)
Basophils Absolute: 0.1 10*3/uL (ref 0.0–0.1)
Basophils Relative: 1 %
Eosinophils Absolute: 0 10*3/uL (ref 0.0–0.5)
Eosinophils Relative: 0 %
HCT: 30.1 % — ABNORMAL LOW (ref 36.0–46.0)
Hemoglobin: 9 g/dL — ABNORMAL LOW (ref 12.0–15.0)
Immature Granulocytes: 14 %
Lymphocytes Relative: 11 %
Lymphs Abs: 1.7 10*3/uL (ref 0.7–4.0)
MCH: 28.9 pg (ref 26.0–34.0)
MCHC: 29.9 g/dL — ABNORMAL LOW (ref 30.0–36.0)
MCV: 96.8 fL (ref 80.0–100.0)
Monocytes Absolute: 0.4 10*3/uL (ref 0.1–1.0)
Monocytes Relative: 3 %
Neutro Abs: 11.2 10*3/uL — ABNORMAL HIGH (ref 1.7–7.7)
Neutrophils Relative %: 71 %
Platelets: 235 10*3/uL (ref 150–400)
RBC: 3.11 MIL/uL — ABNORMAL LOW (ref 3.87–5.11)
RDW: 19.4 % — ABNORMAL HIGH (ref 11.5–15.5)
WBC: 15.5 10*3/uL — ABNORMAL HIGH (ref 4.0–10.5)
nRBC: 1.9 % — ABNORMAL HIGH (ref 0.0–0.2)

## 2020-09-28 LAB — GLUCOSE, CAPILLARY
Glucose-Capillary: 206 mg/dL — ABNORMAL HIGH (ref 70–99)
Glucose-Capillary: 256 mg/dL — ABNORMAL HIGH (ref 70–99)
Glucose-Capillary: 324 mg/dL — ABNORMAL HIGH (ref 70–99)
Glucose-Capillary: 254 mg/dL — ABNORMAL HIGH (ref 70–99)

## 2020-09-28 MED ORDER — APIXABAN 5 MG PO TABS: 5.0000 mg | ORAL_TABLET | Freq: Two times a day (BID) | ORAL | Status: DC

## 2020-09-28 MED ORDER — OXYCODONE HCL 5 MG PO TABS: 5.0000 mg | ORAL_TABLET | ORAL | 0 refills | Status: DC | PRN

## 2020-09-28 MED ORDER — INSULIN ASPART 100 UNIT/ML FLEXPEN: 1.0000 [IU] | PEN_INJECTOR | Freq: Three times a day (TID) | SUBCUTANEOUS | 11 refills | Status: AC

## 2020-09-28 MED ORDER — APIXABAN 5 MG PO TABS: 10.0000 mg | ORAL_TABLET | Freq: Two times a day (BID) | ORAL | Status: DC

## 2020-09-28 MED ORDER — DOCUSATE SODIUM 100 MG PO CAPS: 100.0000 mg | ORAL_CAPSULE | Freq: Two times a day (BID) | ORAL | 0 refills | Status: DC

## 2020-09-28 MED ORDER — ENSURE MAX PROTEIN PO LIQD
11.0000 [oz_av] | Freq: Every day | ORAL | Status: AC
Start: 2020-09-28 — End: ?

## 2020-09-28 MED ORDER — INSULIN ASPART 100 UNIT/ML IJ SOLN: 0.0000 [IU] | Freq: Three times a day (TID) | INTRAMUSCULAR | 11 refills | Status: DC

## 2020-09-28 MED ORDER — INSULIN GLARGINE 100 UNIT/ML ~~LOC~~ SOLN: 14.0000 [IU] | Freq: Every day | SUBCUTANEOUS | 11 refills | Status: DC

## 2020-09-28 MED ORDER — ONDANSETRON 4 MG PO TBDP: 4.0000 mg | ORAL_TABLET | Freq: Three times a day (TID) | ORAL | 0 refills | Status: AC | PRN

## 2020-09-28 MED ORDER — GLUCERNA SHAKE PO LIQD: 237.0000 mL | Freq: Two times a day (BID) | ORAL | 0 refills | Status: DC

## 2020-09-28 NOTE — TOC Transition Note (Addendum)
Transition of Care [facility]) - CM/SW Discharge Note   Patient Details  Name: Rhonda [name] MRN: [id number] Date of Birth: [birth year]  Transition of Care [facility]) CM/SW Contact:  Benard Halsted, LCSW Phone Number: 09/28/2020, 2:27 PM   Clinical Narrative:    Patient will DC to: [place] Anticipated DC date: 09/28/20 Family notified: [name], spouse Transport by: [name]   Per MD patient ready for DC to [facility]. RN to call report prior to discharge [phone]). RN, patient, patient's family, and facility notified of DC. Discharge Summary and FL2 sent to facility. SNF has wound vac orders. DC packet on chart with signed script. Ambulance transport requested for patient.   CSW will sign off for now as social work intervention is no longer needed. Please consult [place] again if new needs arise.      Final next level of care: Skilled Nursing Facility Barriers to Discharge: Barriers Resolved   Patient Goals and CMS Choice Patient states their goals for this hospitalization and ongoing recovery are:: Rehab CMS Medicare.gov Compare Post Acute Care list provided to:: Patient Choice offered to / list presented to : [place]  Discharge Placement   Existing PASRR number confirmed : 09/28/20          Patient chooses bed at: [facility] Patient to be transferred to facility by: [place] Name of family member notified: Spouse Patient and family notified of of transfer: 09/28/20  Discharge Plan and Services In-house Referral: Clinical Social Work   Post Acute Care Choice: [place]                               Social Determinants of Health (SDOH) Interventions     Readmission Risk Interventions Readmission Risk Prevention Plan 09/12/2020 09/04/2020  Transportation Screening Complete Complete  Medication Review Press photographer) - Referral to Pharmacy  PCP or Specialist appointment within 3-5 days of discharge Complete  Complete  HRI or Home Care Consult Complete Complete  SW Recovery Care/Counseling Consult Complete Complete  Palliative Care Screening Not Applicable Not Applicable  Skilled Nursing Facility Complete Complete  Some recent data might be hidden

## 2020-09-28 NOTE — TOC Progression Note (Addendum)
Transition of Care [facility]) - Progression Note    Patient Details  Name: ALZADA BRAZEE MRN: [id number] Date of Birth: 1961/02/28  Transition of Care [facility]) CM/SW [place], [place] Phone Number: 09/28/2020, 11:25 AM  Clinical Narrative:    11:25am-CSW touched base with [facility] regarding status of insurance approval. It is still pending but will hopefully hear back today.   1:30pm-[facility] Dresser has received Biochemist, clinical. CSW updated patient and spouse at bedside.   Expected Discharge Plan: [place] Barriers to Discharge: Insurance Authorization,Continued Medical Work up  Expected Discharge Plan and Services Expected Discharge Plan: [place] In-house Referral: Clinical Social Work   Post Acute Care Choice: [place] Living arrangements for the past 2 months: Single Family Home Expected Discharge Date: 09/28/20                                     Social Determinants of Health (SDOH) Interventions    Readmission Risk Interventions Readmission Risk Prevention Plan 09/12/2020 09/04/2020  Transportation Screening Complete Complete  Medication Review Press photographer) - Referral to Pharmacy  PCP or Specialist appointment within 3-5 days of discharge Complete Complete  HRI or Home Care Consult Complete Complete  SW Recovery Care/Counseling Consult Complete Complete  Palliative Care Screening Not Applicable Not Applicable  Skilled Nursing Facility Complete Complete  Some recent data might be hidden

## 2020-09-28 NOTE — Consult Note (Signed)
[place] Nurse wound follow up Patient receiving care in [place]. Brooke Meuth,PA at bedside.  Wound type:surgical Wound POE:UMPNTIRW some bleeding today Drainage (amount, consistency, odor)purulentserosanginous in canister, no odor Periwound:intact Dressing procedure/placement/frequency:two pieces of black foam removed.One piece of black foam packed into inferior aspect of the wound.One piece of narrow black foam placed into wound bed. Drape applied, immediate seal obtained. Supplies at bedside  [place] Nurse ostomy follow up Stoma type/location:LUQ colostomy Stomal assessment/size:retracted, pink, oval shaped Measures: 1 1/2" Oval Mucocutaneous separationfrom 6:00 to 12:00improving. Peristomal assessment:intact Treatment options for stomal/peristomal skin:barrier ring and convex pouch Output:softbrown stool Ostomy pouching: 1pc.Flexible convex, [name] [phone] Education provided:none Enrolled patient in [facility] Discharge program: Enrolled previously Patient to be discharged today to a rehab facility. Bedside RN was told to remove Vac dressing and change to wet/dry until patient reaches the facility for them to apply there vac if not comparable to our vac.   [name], MSN, RN, [place], [name], [facility] Wound Treatment Associate Pager [phone]

## 2020-09-28 NOTE — Progress Notes (Signed)
[facility] Progress Note  27 Days Post-Op  Subjective: CC-  Husband at bedside. Feeling well today. Denies abdominal pain, n/v. Tolerating diet. Colostomy productive. Hoping to go to rehab today.  Objective: Vital signs in last 24 hours: Temp:  [98 F (36.7 C)-98.9 F (37.2 C)] 98 F (36.7 C) (05/13 0335) Pulse Rate:  [81-103] 81 (05/13 0335) Resp:  [18-19] 18 (05/13 0335) BP: (104-114)/(67-81) 109/67 (05/13 0335) SpO2:  [90 %-100 %] 99 % (05/13 0335) Weight:  [92.2 kg] 92.2 kg (05/13 0340) Last BM Date: 09/27/20  Intake/Output from previous day: 05/12 0701 - 05/13 0700 In: 899.8 [P.O.:720; I.V.:79.8; IV Piggyback:100] Out: 2450 [Urine:2250; Stool:200] Intake/Output this shift: No intake/output data recorded.  PE: General: Alert, NAD Lungs: rate and effort normal Abd: soft,minimally distended, NT,midline woundwith healthy granulation tissue and no erythema or purulent drainage. Stoma with less mucocutaneous separation from about 9-10 o'clock position      Lab Results:  Recent Labs    09/27/20 0020 09/28/20 0224  WBC 15.6* 15.5*  HGB 8.5* 9.0*  HCT 28.5* 30.1*  PLT 245 235   BMET No results for input(s): NA, K, CL, CO2, GLUCOSE, BUN, CREATININE, CALCIUM in the last 72 hours. PT/INR No results for input(s): LABPROT, INR in the last 72 hours. CMP     Component Value Date/Time   NA 133 (L) 09/25/2020 0358   NA 129 (L) 08/06/2020 1003   K 4.4 09/25/2020 0358   CL 98 09/25/2020 0358   CO2 28 09/25/2020 0358   GLUCOSE 178 (H) 09/25/2020 0358   BUN 5 (L) 09/25/2020 0358   BUN 6 08/06/2020 1003   CREATININE 0.39 (L) 09/25/2020 0358   CREATININE 0.85 08/02/2015 1710   CALCIUM 8.1 (L) 09/25/2020 0358   PROT 4.6 (L) 09/24/2020 0356   PROT 4.9 (L) 08/06/2020 1003   ALBUMIN 1.9 (L) 09/24/2020 0356   ALBUMIN 3.2 (L) 08/06/2020 1003   AST 17 09/24/2020 0356   ALT 29 09/24/2020 0356   ALKPHOS 130 (H) 09/24/2020 0356   BILITOT <0.1 (L) 09/24/2020  0356   BILITOT 0.4 08/06/2020 1003   GFRNONAA >60 09/25/2020 0358   GFRNONAA 78 08/02/2015 1710   GFRAA >60 01/17/2020 0546   GFRAA >89 08/02/2015 1710   Lipase     Component Value Date/Time   LIPASE 64 (H) 07/23/2020 0432       Studies/Results: CT ABDOMEN PELVIS W CONTRAST  Result Date: 09/26/2020 CLINICAL DATA:  Rectal bleeding, abdominal abscess/infection suspected EXAM: CT ABDOMEN AND PELVIS WITH CONTRAST TECHNIQUE: Multidetector CT imaging of the abdomen and pelvis was performed using the standard protocol following bolus administration of intravenous contrast. Sagittal and coronal MPR images reconstructed from axial data set. CONTRAST:  94mL OMNIPAQUE IOHEXOL 300 MG/ML SOLN IV. Dilute oral contrast. COMPARISON:  09/20/2020 FINDINGS: Lower chest: Minimal atelectasis dependently at RIGHT lung base small amount of fluid inferior to RIGHT pulmonary hilum, question recess of pericardial fluid. Hepatobiliary: Gallbladder and liver normal appearance Pancreas: Normal appearance Spleen: Normal appearance Adrenals/Urinary Tract: Small LEFT renal cyst. Adrenal glands, kidneys, ureters, and bladder otherwise normal appearance. Stomach/Bowel: Stomach unremarkable. Normal appendix. Few scattered colonic diverticula. Portions of the lateral LEFT abdomen excluded due to body habitus, including portions of the descending colon. LEFT lower quadrant colostomy. No focal bowel abnormality seen. Vascular/Lymphatic: Atherosclerotic calcifications aorta without aneurysm. No adenopathy. Reproductive: Uterus surgically absent with nonvisualization of ovaries Other: No free air or free fluid. No hernia. Ventral midline surgical wound containing minimal fluid and gas,  may be related to recent surgery, open wound, or infection. Musculoskeletal: Significant osseous demineralization. Grade 1 anterolisthesis L4-L5 secondary to degenerative disc and facet disease. Additional degenerative disc disease changes L5-S1.  IMPRESSION: Minimal colonic diverticulosis without evidence of diverticulitis. Minimal gas and fluid within ventral surgical wound, question related to surgery versus open wound or infection. No acute intra-abdominal or intrapelvic abnormalities. Aortic Atherosclerosis (ICD10-I70.0). Electronically Signed   By: Lavonia Dana M.D.   On: 09/26/2020 15:52    Anti-infectives: Anti-infectives (From admission, onward)   Start     Dose/Rate Route Frequency Ordered Stop   09/21/20 0030  cefTRIAXone (ROCEPHIN) 2 g in sodium chloride 0.9 % 100 mL IVPB        2 g 200 mL/hr over 30 Minutes Intravenous Daily at bedtime 09/21/20 0012     09/21/20 0030  metroNIDAZOLE (FLAGYL) IVPB 500 mg        500 mg 100 mL/hr over 60 Minutes Intravenous Every 8 hours 09/21/20 0012     09/01/20 1930  ciprofloxacin (CIPRO) IVPB 400 mg        400 mg 200 mL/hr over 60 Minutes Intravenous Every 12 hours 09/01/20 1836 09/05/20 1927   09/01/20 1930  metroNIDAZOLE (FLAGYL) IVPB 500 mg        500 mg 100 mL/hr over 60 Minutes Intravenous Every 8 hours 09/01/20 1836 09/05/20 2124       Assessment/Plan Hx of HTN DM2 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  OSA ABL anemia- hgb9, stable PE - on eliquis - above per [facility]-  Ms. 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 Grandville Silos - POD #25 -CT5/5 w/dilated bowel and pneumatosis - NGTwassubsequentlyreinsertedand patient started onIV abx.Her lactic was wnl and wbc normalized. She remained NT on exam. Since that time, NGT was d/c'd and diet has been advanced and tolerated. TPN off. Repeat CT 5/11 w/ no acute intra-abdominal or intrapelvic  abnormalities. Cont abx through 5/13 am and then can d/c.  -Monitorseparation of stoma. Stomaremains functioning and above the fascia. -Continue PT/OT, mobilize - recommending SNF - [facility] following for ostomy education -Vac changes MWF  FEN - CM diet,TPN off VTE -SCDs,eliquis ID -Cipro/Flagyl4/16- 4/20,rocephin/flagyl5/6 - 5/13. Suspect elevated wbc from steroids  Foley - None  Plan: Ok to d/c antibiotics today. Patient is stable for discharge to SNF from surgical standpoint. Discharge instructions and follow up info on AVS.   LOS: 30 days    [name], [facility] 09/28/2020, 8:34 AM Please see Amion for pager number during day hours 7:00am-4:30pm

## 2020-09-28 NOTE — Discharge Instructions (Signed)
Colostomy Home Guide, Adult  Colostomy surgery is done to create an opening in the front of the abdomen for stool (feces) to leave the body through an ostomy (stoma). Part of the large intestine is attached to the stoma. A bag, also called a pouch, is fitted over the stoma. Stool and gas will collect in the bag. After surgery, you will need to empty and change your colostomy bag as needed. You will also need to care for your stoma. How to care for the stoma Your stoma should look pink, red, and moist, like the inside of your cheek. Soon after surgery, the stoma may be swollen, but this swelling will go away within 6 weeks. To care for the stoma:  Keep the skin around the stoma clean and dry.  Use a clean, soft washcloth to gently wash the stoma and the skin around it. Clean using a circular motion, and wipe away from the stoma opening, not toward it. ? Use warm water and only use cleansers recommended by your health care provider. ? Rinse the stoma area with plain water. ? Dry the area around the stoma well.  Use stoma powder or ointment on your skin only as told by your health care provider. Do not use any other powders, gels, wipes, or creams on the skin around the stoma.  Check the stoma area every day for signs of infection. Check for: ? New or worsening redness, swelling, or pain. ? New or increased fluid or blood. ? Pus or warmth.  Measure the stoma opening regularly and record the size. Watch for changes. (It is normal for the stoma to get smaller as swelling goes away.) Share this information with your health care provider. How to empty the colostomy bag Empty your bag at bedtime and whenever it is one-third to one-half full. Do not let the bag get more than half-full with stool or gas. The bag could leak if it gets too full. Some colostomy bags have a built-in gas release valve that releases gas often throughout the day. Follow these basic steps: 1. Wash your hands with soap and  water. 2. Sit far back on the toilet seat. 3. Put several pieces of toilet paper into the toilet water. This will prevent splashing as you empty stool into the toilet. 4. Remove the clip or the hook-and-loop fastener from the tail end of the bag. 5. Unroll the tail, then empty the stool into the toilet. 6. Clean the tail with toilet paper or a moist towelette. 7. Reroll the tail, and close it with the clip or the hook-and-loop fastener. 8. Wash your hands again.   How to change the colostomy bag Change your bag every 3-4 days or as often as told by your health care provider. Also change the bag if it is leaking or separating from the skin, or if your skin around the stoma looks or feels irritated. Irritated skin may be a sign that the bag is leaking. Always have colostomy supplies with you, and follow these basic steps: 1. Wash your hands with soap and water. Have paper towels or tissues nearby to clean any discharge. 2. Remove the old bag and skin barrier. Use your fingers or a warm cloth to gently push the skin away from the barrier. 3. Clean the stoma area with water or with mild soap and water, as directed. Use water to rinse away any soap. 4. Dry the skin. You may use the cool setting on a hair dryer to do  this. 5. Use a tracing pattern (template) to cut the skin barrier to the size needed. 6. If you are using a two-piece bag, attach the bag and the skin barrier to each other. Add the barrier ring, if you use one. 7. If directed, apply stoma powder or skin barrier gel to the skin. 8. Warm the skin barrier with your hands, or blow with a hair dryer for 5-10 seconds. 9. Remove the paper from the adhesive strip of the skin barrier. 10. Press the adhesive strip onto the skin around the stoma. 11. Gently rub the skin barrier onto the skin. This creates heat that helps the barrier to stick. 12. Apply stoma tape to the edges of the skin barrier, if desired. 56. Wash your hands again. General  recommendations  Avoid wearing tight clothes or having anything press directly on your stoma or bag. Change your clothing whenever it is soiled or damp.  You may shower or bathe with the bag on or off. Do not use harsh or oily soaps or lotions. Dry the skin and bag after bathing.  Store all supplies in a cool, dry place. Do not leave supplies in extreme heat because some parts can melt or not stick as well.  Whenever you leave home, take extra clothing and an extra skin barrier and bag with you.  If your bag gets wet, you can dry it with a hair dryer on the cool setting.  To prevent odor, you may put drops of ostomy deodorizer in the bag.  If recommended by your health care provider, put ostomy lubricant inside the bag. This helps stool to slide out of the bag more easily and completely. Contact a health care provider if:  You have new or worsening redness, swelling, or pain around your stoma.  You have new or increased fluid or blood coming from your stoma.  Your stoma feels warm to the touch.  You have pus coming from your stoma.  Your stoma extends in or out farther than normal.  You need to change your bag every day.  You have a fever. Get help right away if:  Your stool is bloody.  You have nausea or you vomit.  You have trouble breathing. Summary  Measure your stoma opening regularly and record the size. Watch for changes.  Empty your bag at bedtime and whenever it is one-third to one-half full. Do not let the bag get more than half-full with stool or gas.  Change your bag every 3-4 days or as often as told by your health care provider.  Whenever you leave home, take extra clothing and an extra skin barrier and bag with you. This information is not intended to replace advice given to you by your health care provider. Make sure you discuss any questions you have with your health care provider. Document Revised: 01/05/2020 Document Reviewed: 01/05/2020 Elsevier  Patient Education  2021 [place].   Negative Pressure Wound Therapy Home Guide Negative pressure wound therapy (NPWT) uses a sponge or foam-like material (dressing) placed on or inside the wound. The wound is then covered and sealed with a cover dressing that sticks to your skin (is adhesive). This keeps air out. A tube is attached to the cover dressing, and this tube connects to a small pump. The pump sucks fluid and germs from the wound. NPWT helps to increase blood flow to the wound and heal it from the inside. What are the risks? NPWT is usually safe to use. However, problems  can occur, including:  Skin irritation from the dressing adhesive.  Bleeding.  Infection.  Dehydration. Wounds with large amounts of drainage can cause excessive fluid loss.  Pain. Supplies needed:  A disposable garbage bag.  Soap and water, or hand sanitizer.  Wound cleanser or salt-water solution (saline).  New sponge and cover dressing.  Protective clothing.  Gauze pad.  Vinyl gloves.  Tape.  Skin protectant. This may be a wipe, film, or spray.  Clean or germ-free (sterile) scissors.  Eye protection. How to change your dressing Prepare to change your dressing 1. If told by your health care provider, take pain medicine 30 minutes before changing the dressing. 2. Wash your hands with soap and water. Dry your hands with a clean towel. If soap and water are not available, use hand sanitizer. 3. Set up a clean station for wound care. 4. Open the dressing package so that the sponge dressing remains on the inside of the package. 5. Wear gloves, protective clothing, and eye protection.   Remove old dressing 1. Turn off the pump and disconnect the tubing from the dressing. 2. Carefully remove the adhesive cover dressing in the direction of your hair growth. 3. Remove the sponge dressing that is inside the wound. If the sponge sticks, use a wound cleanser or saline solution to wet the sponge  and help it come off more easily. 4. Throw the old sponge and cover dressing supplies into the garbage bag. 5. Remove your gloves by grabbing the cuff and turning the glove inside out. Place the gloves in the trash immediately. 6. Wash your hands with soap and water. Dry your hands with a clean towel. If soap and water are not available, use hand sanitizer.   Clean your wound  Wear gloves, protective clothing, and eye protection. Follow your health care provider's instructions on how to clean your wound. You may be told to: 1. Clean the wound using a saline solution or a wound cleanser and a clean gauze pad. 2. Pat the wound dry with a gauze pad. Do not rub the wound. 3. Throw the gauze pad into the garbage bag. 4. Remove your gloves by grabbing the cuff and turning the glove inside out. Place the gloves in the trash immediately. 5. Wash your hands with soap and water. Dry your hands with a clean towel. If soap and water are not available, use hand sanitizer. Apply new dressing  Wear gloves, protective clothing, and eye protection. 1. If told by your health care provider, apply a skin protectant to any skin that will be exposed to adhesive. Let the skin protectant dry. 2. Cut a piece of new sponge dressing and put it on or in the wound. 3. Using clean scissors, cut a nickel-sized hole in the new cover dressing. 4. Apply the cover dressing. 5. Attach the suction tube over the hole in the cover dressing. 6. Take off your gloves. Put them in the plastic bag with the old dressing. Tie the bag shut and throw it away. 7. Wash your hands with soap and water. Dry your hands with a clean towel. If soap and water are not available, use hand sanitizer. 8. Turn the pump back on. The sponge dressing should collapse. Do not change the settings on the machine without talking to a health care provider. 9. Replace the container in the pump that collects fluid if it is full. Replace the container per the  manufacturer's instructions or at least once a week, even if it  is not full. General tips and recommendations If the alarm sounds:  Stay calm.  Do not turn off the pump or do anything with the dressing.  Reasons the alarm may go off: ? The battery is low. Change the battery or plug the device into electrical power. ? The dressing has a leak. Find the leak and put tape over the leak. ? The fluid collection container is full. Change the fluid container.  Call your health care provider right away if you cannot fix the problem.  Explain to your health care provider what is happening. Follow his or her instructions. General instructions  Do not turn off the pump unless told to do so by your health care provider.  Do not turn off the pump for more than 2 hours. If the pump is off for more than 2 hours, the dressing will need to be changed.  If your health care provider says it is okay to shower: ? Do not take the pump into the shower. ? Make sure the wound dressing is protected and sealed. The wound dressing must stay dry.  Check frequently that the machine indicates that therapy is on and that all clamps are open.  Do not use over-the-counter medicated or antiseptic creams, sprays, liquids, or dressings unless your health care provider approves. Contact a health care provider if:  You have new pain.  You develop irritation, a rash, or itching around the wound or dressing.  You see new black or yellow tissue in your wound.  The dressing changes are painful or cause bleeding.  The pump has been off for more than 2 hours, and you do not know how to change the dressing.  The pump alarm goes off, and you do not know what to do. Get help right away if:  You have a lot of bleeding.  The wound breaks open.  You have severe pain.  You have signs of infection, such as: ? More redness, swelling, or pain. ? More fluid or blood. ? Warmth. ? Pus or a bad smell. ? Red streaks  leading from the wound. ? A fever.  You see a sudden change in the color or texture of the drainage.  You have signs of dehydration, such as: ? Little or no tears, urine, or sweat. ? Muscle cramps. ? Very dry mouth. ? Headache. ? Dizziness. Summary  Negative pressure wound therapy (NPWT) is a device that helps your wound heal.  Set up a clean station for wound care. Your health care provider will tell you what supplies to use.  Follow your health care provider's instructions on how to clean your wound and how to change the dressing.  Contact a health care provider if you have new pain, an irritation, or a rash, or if the alarm goes off and you do not know what to do.  Get help right away if you have a lot of bleeding, your wound breaks open, or you have severe pain. Also, get help if you have signs of infection. This information is not intended to replace advice given to you by your health care provider. Make sure you discuss any questions you have with your health care provider. Document Revised: 07/11/2019 Document Reviewed: 07/23/2018 Elsevier Patient Education  2021 [facility], [place] [phone]  OPEN ABDOMINAL SURGERY: POST OP INSTRUCTIONS  Always review your discharge instruction sheet given to you by the facility where your surgery was performed.  IF  YOU HAVE DISABILITY OR FAMILY LEAVE FORMS, YOU MUST BRING THEM TO THE OFFICE FOR PROCESSING.  PLEASE DO NOT GIVE THEM TO YOUR DOCTOR.  1. A prescription for pain medication may be given to you upon discharge.  Take your pain medication as prescribed, if needed.  If narcotic pain medicine is not needed, then you may take acetaminophen (Tylenol) or ibuprofen (Advil) as needed. 2. Take your usually prescribed medications unless otherwise directed. 3. If you need a refill on your pain medication, please contact your pharmacy. They will contact our office to request authorization.  Prescriptions  will not be filled after 5pm or on week-ends. 4. You should follow a light diet the first few days after arrival home, such as soup and crackers, pudding, etc.unless your doctor has advised otherwise. A high-fiber, low fat diet can be resumed as tolerated.   Be sure to include lots of fluids daily. Most patients will experience some swelling and bruising on the chest and neck area.  Ice packs will help.  Swelling and bruising can take several days to resolve 5. Most patients will experience some swelling and bruising in the area of the incision. Ice pack will help. Swelling and bruising can take several days to resolve..  6. It is common to experience some constipation if taking pain medication after surgery.  Increasing fluid intake and taking a stool softener will usually help or prevent this problem from occurring.  A mild laxative (Milk of Magnesia or Miralax) should be taken according to package directions if there are no bowel movements after 48 hours. 7.  You may have steri-strips (small skin tapes) in place directly over the incision.  These strips should be left on the skin for 7-10 days.  If your surgeon used skin glue on the incision, you may shower in 24 hours.  The glue will flake off over the next 2-3 weeks.  Any sutures or staples will be removed at the office during your follow-up visit. You may find that a light gauze bandage over your incision may keep your staples from being rubbed or pulled. You may shower and replace the bandage daily. 8. ACTIVITIES:  You may resume regular (light) daily activities beginning the next day--such as daily self-care, walking, climbing stairs--gradually increasing activities as tolerated.  You may have sexual intercourse when it is comfortable.  Refrain from any heavy lifting or straining until approved by your doctor. a. You may drive when you no longer are taking prescription pain medication, you can comfortably wear a seatbelt, and you can safely maneuver  your car and apply brakes b. Return to Work: ___________________________________ 89. You should see your doctor in the office for a follow-up appointment approximately two weeks after your surgery.  Make sure that you call for this appointment within a day or two after you arrive home to insure a convenient appointment time. OTHER INSTRUCTIONS:  _____________________________________________________________ _____________________________________________________________  WHEN TO CALL YOUR DOCTOR: 1. Fever over 101.0 2. Inability to urinate 3. Nausea and/or vomiting 4. Extreme swelling or bruising 5. Continued bleeding from incision. 6. Increased pain, redness, or drainage from the incision. 7. Difficulty swallowing or breathing 8. Muscle cramping or spasms. 9. Numbness or tingling in hands or feet or around lips.  The clinic staff is available to answer your questions during regular business hours.  Please don't hesitate to call and ask to speak to one of the nurses if you have concerns.  For further questions, please visit [email or web address]    ______________________________________________  Information on my medicine - ELIQUIS (apixaban)  This medication education was reviewed with me or my healthcare representative as part of my discharge preparation.    Why was Eliquis prescribed for you? Eliquis was prescribed to treat blood clots that may have been found in the veins of your legs (deep vein thrombosis) or in your lungs (pulmonary embolism) and to reduce the risk of them occurring again.  What do You need to know about Eliquis ? The starting dose is 10 mg (two 5 mg tablets) taken TWICE daily for the FIRST SEVEN (7) DAYS, then on 10/04/2020 the dose is reduced to ONE 5 mg tablet taken TWICE daily.  Eliquis may be taken with or without food.   Try to take the dose about the same time in the morning and in the evening. If you have difficulty swallowing the tablet whole  please discuss with your pharmacist how to take the medication safely.  Take Eliquis exactly as prescribed and DO NOT stop taking Eliquis without talking to the doctor who prescribed the medication.  Stopping may increase your risk of developing a new blood clot.  Refill your prescription before you run out.  After discharge, you should have regular check-up appointments with your healthcare provider that is prescribing your Eliquis.    What do you do if you miss a dose? If a dose of ELIQUIS is not taken at the scheduled time, take it as soon as possible on the same day and twice-daily administration should be resumed. The dose should not be doubled to make up for a missed dose.  Important Safety Information A possible side effect of Eliquis is bleeding. You should call your healthcare provider right away if you experience any of the following: ? Bleeding from an injury or your nose that does not stop. ? Unusual colored urine (red or dark brown) or unusual colored stools (red or black). ? Unusual bruising for unknown reasons. ? A serious fall or if you hit your head (even if there is no bleeding).  Some medicines may interact with Eliquis and might increase your risk of bleeding or clotting while on Eliquis. To help avoid this, consult your healthcare provider or pharmacist prior to using any new prescription or non-prescription medications, including herbals, vitamins, non-steroidal anti-inflammatory drugs (NSAIDs) and supplements.  This website has more information on Eliquis (apixaban): [email or web address]

## 2020-09-28 NOTE — Discharge Summary (Addendum)
Physician Discharge Summary  [name] ZSM:[id number] DOB: October 31, 1960 DOA: 08/29/2020  PCP: [facility] date: 08/29/2020 Discharge date: 09/28/2020  Admitted From: home Disposition:  SNF   Recommendations for Outpatient Follow-up:  1. Recommend Nutrition consult at facility  2. Follow sugars and adjust insulin as oral intake improves   Discharge Condition:  stable   CODE STATUS:  Full code   Diet recommendation:  Carb modified, heart healthy Consultations:  General surgery  GI  Critical care  IR  Procedures/Studies:  Embolization inferior mesenteric artery 4/13  Hartman's procedure 4/16.    Discharge Diagnoses:  Principal Problem:   Bowel perforation (HCC) Active Problems:   GI bleed- diverticular  Acute blood loss anemia   Hypotension   Thrush   AKI (acute kidney injury) ([place])   Lung mass   Neurosarcodisosis    OSA   Wheelchair-bound   Brief Summary: Rhonda [name] 60 year old with past medical history significant for hypertension, obesity, obstructive sleep apnea not using CPAP, neurosarcoidosis on chronic prednisone, wheelchair-bound due to chronic debility, recurrent GI bleeding with recent multiple hospitalization presented back on 4/13 to the emergency room with frank rectal bleeding and hypotension.  CTA revealed acute bleeding in the descending colon and coil embolization of distal [place] branch of the superior sigmoid artery was performed.   4/16 severe abdominal pain> CT revealed visceral perforation. Underwent an urgent ex lap and found to have a necrotic sigmoid colon. Underwent Hartmann procedure with colostomy and wound vac to midline abdominal incision.    Hospital Course:  Principal Problem:  Perforated sigmoid colon  Emergency exploratory laparotomy and Hartman's procedure 4/16 - management per general surgery - had a prolonged ileus after surgery and required TPN which was weaned off a couple of days ago - wound  vac being changed MWF - on Rocephin and Flagyl for peritonitis- today is the last day of this course - She she has mild leukocytosis but CT yesterday did not reveal further infection in her abdomen and no other signs of infection noted - general surgery feels she can be discharged to SNF with the wound vac to the abdominal incision and will arrange office f/u for her  Active Problems:   GI bleed with acute blood loss anemia - initially felt to be diverticular, and as mentioned embolized on 4/13 - s/p 2 U PRBC during hospital stay - recommend Hb be recheck in 2 wks  AKI  -  Cr noted on admission was 1.04 - due to above GI bleeding - has resolved with volume resuscitation and Cr is now 0.39  Acute PE - found incidentally on CT abd/pelvis on 5/5, dedicated chest CT done on 5/6  - CTA 5/6 >  pulmonary emboli in each posterior segment left lower lobe pulmonary artery as well as a larger pulmonary embolus in the proximal left lower lobe pulmonary artery, incompletely obstructing. No appreciable right heart strain. - she was on Heparin infusion until it was determined that she would not need furtehr surgery- 5/12>  can switch to oral per Alferd Apa - transitioned to Eliquis   Neurosarcoidosis, Cushing's syndrome  - wheelchair bound - Has been on Solumedrol since 5/6 @ 40 mg BID due to surgery and post op ileus-   - transitioned back to Prednisone 40 mg BID yesterday which is her home dose - BPs stable- no signs of adrenal insufficiency    DM2 - cont Lantus and Novolog- changed Novolog to [facility]  Lung mass -  CT 5/6 > There is a mass in the medial right lower lobe region extending posterior to the left atrium. This mass measures 3.2 x 2.1 cm and is slightly larger compared to June 2021 study.  - see full report below - please follow up as outpatient  Thrush - resolved with Nystatin  HTN - BP has been low, thus Metoprolol has been on hold    Discharge Exam: Vitals:    09/28/20 0030 09/28/20 0335  BP: 114/77 109/67  Pulse: 88 81  Resp: 18 18  Temp: 98.7 F (37.1 C) 98 F (36.7 C)  SpO2: 97% 99%   Vitals:   09/27/20 2011 09/28/20 0030 09/28/20 0335 09/28/20 0340  BP: 105/81 114/77 109/67   Pulse: (!) 103 88 81   Resp: $Remo'18 18 18   'aFqjS$ Temp: 98.9 F (37.2 C) 98.7 F (37.1 C) 98 F (36.7 C)   TempSrc: Oral Axillary Axillary   SpO2: 100% 97% 99%   Weight:    92.2 kg  Height:        General: Pt is alert, awake, not in acute distress Cardiovascular: RRR, S1/S2 +, no rubs, no gallops Respiratory: CTA bilaterally, no wheezing, no rhonchi Abdominal: Soft,  bowel sounds +. Vertical midline incision noted with wound vac, colostomy in LLQ Extremities: 1+ pitting edema, no cyanosis   Discharge Instructions  Discharge Instructions    Diet - low sodium heart healthy   Complete by: As directed    Increase activity slowly   Complete by: As directed    No wound care   Complete by: As directed      Allergies as of 09/28/2020      Reactions   Acetaminophen Other (See Comments)   Patient states tylenol makes her sick.       Medication List    STOP taking these medications   aspirin 81 MG EC tablet   fluconazole 100 MG tablet Commonly known as: Diflucan   insulin glargine 100 UNIT/ML Solostar Pen Commonly known as: LANTUS Replaced by: insulin glargine 100 UNIT/ML injection   metoprolol tartrate 25 MG tablet Commonly known as: LOPRESSOR   omeprazole 40 MG capsule Commonly known as: PRILOSEC     TAKE these medications   apixaban 5 MG Tabs tablet Commonly known as: ELIQUIS Take 2 tablets (10 mg total) by mouth 2 (two) times daily for 6 days.   apixaban 5 MG Tabs tablet Commonly known as: ELIQUIS Take 1 tablet (5 mg total) by mouth 2 (two) times daily. Start taking on: Oct 04, 2020   atorvastatin 80 MG tablet Commonly known as: LIPITOR Take 1 tablet (80 mg total) by mouth daily at 6 PM.   b complex vitamins tablet Take 1 tablet by  mouth daily.   blood glucose meter kit and supplies Kit Dispense based on patient and insurance preference. Use up to four times daily as directed.   CALCIUM+D3 PO Take 1 tablet by mouth daily with lunch.   docusate sodium 100 MG capsule Commonly known as: COLACE Take 1 capsule (100 mg total) by mouth 2 (two) times daily.   Ensure Max Protein Liqd Take 330 mLs (11 oz total) by mouth at bedtime.   feeding supplement (GLUCERNA SHAKE) Liqd Take 237 mLs by mouth 2 (two) times daily between meals.   FeroSul 325 (65 FE) MG tablet Generic drug: ferrous sulfate Take 325 mg by mouth 2 (two) times daily.   Fish Oil 1000 MG Caps Take 1,000 mg by mouth daily with lunch.  gabapentin 100 MG capsule Commonly known as: NEURONTIN Take 100 mg by mouth 3 (three) times daily.   glucosamine-chondroitin 500-400 MG tablet Take 1 tablet by mouth 3 (three) times daily. What changed: when to take this   insulin aspart 100 UNIT/ML injection Commonly known as: novoLOG Inject 0-20 Units into the skin 3 (three) times daily with meals.   insulin aspart 100 UNIT/ML FlexPen Commonly known as: NOVOLOG Inject 1-20 Units into the skin 3 (three) times daily with meals. CBG 70 - 120: 0 units  CBG 121 - 150: 3 units  CBG 151 - 200: 4 units  CBG 201 - 250: 7 units  CBG 251 - 300: 11 units  CBG 301 - 350: 15 units  CBG 351 - 400: 20 units   insulin glargine 100 UNIT/ML injection Commonly known as: LANTUS Inject 0.14 mLs (14 Units total) into the skin at bedtime. Replaces: insulin glargine 100 UNIT/ML Solostar Pen   Insulin Pen Needle 29G X 5MM Misc USE WITH LANTUS   melatonin 3 MG Tabs tablet Take 1 tablet (3 mg total) by mouth at bedtime. What changed: how much to take   multivitamin with minerals Tabs tablet Take 1 tablet by mouth daily with lunch.   ondansetron 4 MG disintegrating tablet Commonly known as: Zofran ODT Take 1 tablet (4 mg total) by mouth every 8 (eight) hours as needed for  nausea or vomiting.   oxyCODONE 5 MG immediate release tablet Commonly known as: Oxy IR/ROXICODONE Take 1 tablet (5 mg total) by mouth every 4 (four) hours as needed for severe pain.   pantoprazole 40 MG tablet Commonly known as: PROTONIX Take 1 tablet (40 mg total) by mouth daily.   polyethylene glycol 17 g packet Commonly known as: MIRALAX / GLYCOLAX Take 17 g by mouth 2 (two) times daily. What changed:   when to take this  reasons to take this   predniSONE 20 MG tablet Commonly known as: DELTASONE Take 40 mg by mouth in the morning and at bedtime.   PRESCRIPTION MEDICATION Inhale into the lungs See admin instructions. CPAP- At bedtime       Contact information for follow-up providers    Georganna Skeans, MD. Go on 10/23/2020.   Specialty: General Surgery Why: 240pm. Please arrive 30 minutes prior to your appointment for paperwork. Please bring a copy of your photo ID and insurance card. Contact information: [address] [place] [address] [phone]            Contact information for after-discharge care    Destination    [facility]  Preferred SNF .   Service: Skilled Nursing Contact information: [address] [place] [phone]                 Allergies  Allergen Reactions  . Acetaminophen Other (See Comments)    Patient states tylenol makes her sick.       DG Abd 1 View  Addendum Date: 09/20/2020   ADDENDUM REPORT: 09/20/2020 22:07 ADDENDUM: These results were called by telephone at the time of interpretation on 09/20/2020 at 10:03 pm to provider Sheran Luz, MD , who verbally acknowledged these results. Electronically Signed   By: Fidela Salisbury MD   On: 09/20/2020 22:07   Result Date: 09/20/2020 CLINICAL DATA:  Nausea EXAM: ABDOMEN - 1 VIEW COMPARISON:  09/14/2020 FINDINGS: Multiple gas-filled markedly dilated loops of small bowel are seen throughout the abdomen. There is,  additionally, gas-filled nondilated  loops of small bowel seen within the pelvis as well as a small amount of gas seen within the ascending colon. Together, the findings suggest changes of a high-grade partial or developing complete mid small bowel obstruction. additionally, there is pneumatosis identified within the left mid abdomen involving a dilated loop of small bowel as well as possible portal venous gas within the right upper quadrant possibly indicating bowel ischemia/infarction. No gross free intraperitoneal gas. No organomegaly. No acute bone abnormality. IMPRESSION: Findings in keeping with a high-grade partial or developing complete mid small bowel obstruction. Pneumatosis and portal venous gas in keeping with bowel ischemia and/or infarction. Dedicated contrast enhanced CT examination of the abdomen and pelvis is recommended, preferably without administration of oral contrast. Electronically Signed: By: Fidela Salisbury MD On: 09/20/2020 21:53   DG Abd 1 View  Result Date: 09/10/2020 CLINICAL DATA:  NG tube placement EXAM: ABDOMEN - 1 VIEW COMPARISON:  Radiograph 09/10/2020 at 1220 hours FINDINGS: NG 2 within the stomach. Side port just below the GE junction. Dilated loops of small bowel up to 4 cm again noted. IMPRESSION: NG tube in stomach.  Minimal change. Electronically Signed   By: Suzy Bouchard M.D.   On: 09/10/2020 18:32   DG Abd 1 View  Result Date: 09/10/2020 CLINICAL DATA:  Encounter for feeding tube placement. EXAM: ABDOMEN - 1 VIEW COMPARISON:  None. FINDINGS: Distal tip of nasogastric tube is seen in expected position of proximal stomach. IMPRESSION: Distal tip of nasogastric tube seen in expected position of proximal stomach. Electronically Signed   By: Marijo Conception M.D.   On: 09/10/2020 12:55   CT ANGIO CHEST PE W OR WO CONTRAST  Result Date: 09/21/2020 CLINICAL DATA:  Shortness of breath and elevated D-dimer EXAM: CT ANGIOGRAPHY CHEST WITH CONTRAST TECHNIQUE: Multidetector  CT imaging of the chest was performed using the standard protocol during bolus administration of intravenous contrast. Multiplanar CT image reconstructions and MIPs were obtained to evaluate the vascular anatomy. CONTRAST:  60mL OMNIPAQUE IOHEXOL 350 MG/ML SOLN COMPARISON:  Chest radiograph August 30, 2020; CT angiogram chest November 02, 2019. CT abdomen and pelvis Sep 20, 2020 FINDINGS: Cardiovascular: There is an incompletely obstructing pulmonary embolus in the proximal right lower lobe pulmonary artery. There is a pulmonary embolus in a peripheral branch of the posterior segment of the right lower lobe pulmonary artery. There is a pulmonary embolus in the posterior segment right lower lobe pulmonary artery as well. No appreciable right heart strain. There is left ventricular hypertrophy. There is no thoracic aortic aneurysm or dissection. Visualized great vessels appear normal. No pericardial effusion or pericardial thickening. Mediastinum/Nodes: There is a nodular focus measuring 7 mm in the left lobe of the thyroid which per consensus guidelines does not warrant additional imaging surveillance. Multiple subcentimeter lymph nodes scattered throughout the mediastinum again noted. No frank adenopathy by size criteria evident. A nasogastric tube passes through the esophagus into the stomach. No esophageal lesions appreciable. Lungs/Pleura: There is a mass arising in the right lower lobe extending posterior to the left atrium along its rightward aspect measuring 3.2 x 2.1 cm. This mass was present previously and appears slightly larger at this time. No other parenchymal lung mass evident. There are scattered areas of mosaic attenuation without airspace consolidation. No pleural effusions. Trachea and major bronchial structures appear normal. No pneumothorax. Upper Abdomen: There is evidence of portal venous air, also present on abdominal CT 1 day prior. Visualized upper abdominal structures otherwise appear  unremarkable. Musculoskeletal: Degenerative  change noted in the thoracic spine. No blastic or lytic bone lesions. No chest wall lesions. Review of the MIP images confirms the above findings. IMPRESSION: 1. There are pulmonary emboli in each posterior segment left lower lobe pulmonary artery as well as a larger pulmonary embolus in the proximal left lower lobe pulmonary artery, incompletely obstructing. No appreciable right heart strain. 2. Portal venous air, seen 1 day prior as well. Concern for potential bowel ischemia given this finding. See report from CT abdomen and pelvis Sep 20, 2020 for further clarification. 3. There is a mass in the medial right lower lobe region extending posterior to the left atrium. This mass measures 3.2 x 2.1 cm and is slightly larger compared to June 2021 study. Etiology for this mass uncertain. It may be prudent to correlate with nuclear medicine PET study to assess for degree of metabolic activity. Neoplastic etiology for this lesion cannot be excluded. Nearby lymph nodes in this area do not meet size criteria for pathologic significance. 4. Multiple stable mediastinal lymph nodes which do not meet size criteria for pathologic significance. Clinical significance of the multiplicity of these lymph nodes is uncertain. Nuclear medicine PET study to assess for metabolic activity in these lymph nodes could be helpful in this regard. 5. No edema or airspace consolidation. No pleural effusions. Somewhat mosaic attenuation in the lungs could indicate underlying small airways obstructive disease. 6.  Left ventricular hypertrophy. Critical Value/emergent results were called by telephone at the time of interpretation on 09/21/2020 at 12:14 pm to provider [facility] , who verbally acknowledged these results. Electronically Signed   By: Lowella Grip III M.D.   On: 09/21/2020 12:15   CT ABDOMEN PELVIS W CONTRAST  Result Date: 09/26/2020 CLINICAL DATA:  Rectal bleeding, abdominal  abscess/infection suspected EXAM: CT ABDOMEN AND PELVIS WITH CONTRAST TECHNIQUE: Multidetector CT imaging of the abdomen and pelvis was performed using the standard protocol following bolus administration of intravenous contrast. Sagittal and coronal MPR images reconstructed from axial data set. CONTRAST:  47mL OMNIPAQUE IOHEXOL 300 MG/ML SOLN IV. Dilute oral contrast. COMPARISON:  09/20/2020 FINDINGS: Lower chest: Minimal atelectasis dependently at RIGHT lung base small amount of fluid inferior to RIGHT pulmonary hilum, question recess of pericardial fluid. Hepatobiliary: Gallbladder and liver normal appearance Pancreas: Normal appearance Spleen: Normal appearance Adrenals/Urinary Tract: Small LEFT renal cyst. Adrenal glands, kidneys, ureters, and bladder otherwise normal appearance. Stomach/Bowel: Stomach unremarkable. Normal appendix. Few scattered colonic diverticula. Portions of the lateral LEFT abdomen excluded due to body habitus, including portions of the descending colon. LEFT lower quadrant colostomy. No focal bowel abnormality seen. Vascular/Lymphatic: Atherosclerotic calcifications aorta without aneurysm. No adenopathy. Reproductive: Uterus surgically absent with nonvisualization of ovaries Other: No free air or free fluid. No hernia. Ventral midline surgical wound containing minimal fluid and gas, may be related to recent surgery, open wound, or infection. Musculoskeletal: Significant osseous demineralization. Grade 1 anterolisthesis L4-L5 secondary to degenerative disc and facet disease. Additional degenerative disc disease changes L5-S1. IMPRESSION: Minimal colonic diverticulosis without evidence of diverticulitis. Minimal gas and fluid within ventral surgical wound, question related to surgery versus open wound or infection. No acute intra-abdominal or intrapelvic abnormalities. Aortic Atherosclerosis (ICD10-I70.0). Electronically Signed   By: Lavonia Dana M.D.   On: 09/26/2020 15:52   CT ABDOMEN  PELVIS W CONTRAST  Result Date: 09/20/2020 CLINICAL DATA:  Bowel obstruction suspected EXAM: CT ABDOMEN AND PELVIS WITH CONTRAST TECHNIQUE: Multidetector CT imaging of the abdomen and pelvis was performed using the standard protocol following bolus  administration of intravenous contrast. CONTRAST:  114mL OMNIPAQUE IOHEXOL 300 MG/ML  SOLN COMPARISON:  Radiograph 09/20/2020, CT 09/09/2020 FINDINGS: Lower chest: Patchy opacity present in the right lung base, possibly atelectatic or early airspace disease. Questionable pulmonary embolism in the right lower lobar pulmonary though above the margins of imaging. Normal heart size. No pericardial effusion. Hepatobiliary: Normal hepatic attenuation. Smooth liver surface contour. Extensive branching lucencies in the anti dependent portions of the liver are in a distribution most suggestive of portal venous gas with more central part of venous gas in the left portal vein. Stable hypoattenuating focus adjacent the gallbladder fossa, possible focal fatty infiltration. No worrisome focal liver lesion. Gallbladder is unremarkable without visible calcified gallstones or biliary ductal dilatation. Pancreas: Stable hypoattenuating collection along the anterior head-body junction of the pancreas (3/26) measuring 3.5 by 1 cm in maximum transaxial dimension. Additional small hypoattenuating focus towards the ventral aspect of the pancreatic head as well measuring up to 9 mm in size (3/30)., could reflect sequela of prior pancreatic inflammation without active inflammatory changes of the pancreas at this time. No pancreatic ductal dilatation or other concerning pancreatic lesions. Spleen: Normal in size. No concerning splenic lesions. Adrenals/Urinary Tract: Lobular thickening of the adrenal glands is similar to priors, no concerning focal adrenal lesion. Kidneys are normally located with symmetric enhancementand excretion. Subcentimeter hypoattenuating focus in the left kidney too small  to fully characterize on CT imaging but statistically likely benign. No suspicious renal lesion, urolithiasis or hydronephrosis. Mild bladder wall thickening. Stomach/Bowel: Distal esophagus, stomach and duodenum are free of acute abnormality. Air and fluid distention of much of the proximal small bowel focal segment of borderline thickened jejunum in the left upper quadrant with extensive pneumatosis (3/48) dilatation both upstream and downstream of this segment with additional dilated small bowel in the right lower quadrant demonstrating pneumatosis as well (3/35) some focal thickening and transition noted in the right mid abdomen (5/81). This is in similar vicinity to the comparison study. More distal small bowel is less distended, partly decompressed. Normal appendix in the right lower quadrant. Pancolonic diverticulosis. Fluid-filled appearance of the colon may reflect some rapid transit state. A left lower quadrant end colostomy is noted. Colonic remnant is grossly unremarkable. Vascular/Lymphatic: Atherosclerotic calcifications within the abdominal aorta and branch vessels. No aneurysm or ectasia. Extensive intravenous gas noted within the draining mesentery veins, SM V and portal veins (3/38 for example) no enlarged abdominopelvic lymph nodes. Reproductive: Uterus is surgically absent. No concerning adnexal lesions. Quiescent appearance of the ovarian tissue. Other: Postsurgical changes of the anterior ventral abdomen with overlying vacuum drain. Left lower quadrant ostomy. Lateral body wall edema, left greater than right. Musculoskeletal: Intramuscular lipoma in the left gluteal musculature. No suspicious intramuscular mass or lesion. Multilevel degenerative changes are present in the imaged portions of the spine. Grade 1 anterolisthesis L4 on 5 without associated spondylolysis. Multilevel Schmorl's node formations. The osseous structures appear diffusely demineralized which may limit detection of small or  nondisplaced fractures. IMPRESSION: Persisting small bowel obstruction. Interval development of extensive pneumatosis throughout the distended loops, particularly in the proximal jejunum and bowel segments closer to a transition point in the right lateral abdomen. Extensive air within the draining mesentery and portal veins as well as portal venous gas within the liver. Findings are highly worrisome for developing bowel necrosis. Possible filling defect seen in the right lower lobar pulmonary artery though difficult to fully characterize given finding on the margins of imaging. Recommend dedicated CT PA for further characterization.  Some patchy opacity in the right lung base may be related to the filling defect/developing infarct versus infection/inflammation or atelectasis. Circumferential thickening of the urinary bladder, possibly related to some slight underdistention though could correlate with urinalysis to exclude cystitis. Redemonstrated postsurgical changes of the anterior abdominal wall with left lower quadrant colostomy. Critical Value/emergent results were called by telephone at the time of interpretation on 09/20/2020 at 11:43 pm to provider JARED GARDNER , who verbally acknowledged these results. Electronically Signed   By: Lovena Le M.D.   On: 09/20/2020 23:39   CT ABDOMEN PELVIS W CONTRAST  Result Date: 09/09/2020 CLINICAL DATA:  Abdominal bloating and vomiting. Recent sigmoid resection for necrotic bowel perforation. EXAM: CT ABDOMEN AND PELVIS WITH CONTRAST TECHNIQUE: Multidetector CT imaging of the abdomen and pelvis was performed using the standard protocol following bolus administration of intravenous contrast. CONTRAST:  135mL OMNIPAQUE IOHEXOL 300 MG/ML  SOLN COMPARISON:  09/01/2020 FINDINGS: Lower Chest: Stable mild right basilar atelectasis. Hepatobiliary: No hepatic masses identified. Focal fatty infiltration again seen in the central left lobe adjacent to the porta hepatis.  Gallbladder is unremarkable. No evidence of biliary ductal dilatation. Pancreas:  No mass or inflammatory changes. Spleen: Within normal limits in size and appearance. Adrenals/Urinary Tract: No masses identified. Small left renal cyst noted. No evidence of ureteral calculi or hydronephrosis. Stomach/Bowel: Patient has undergone sigmoid colon resection with left lower quadrant colostomy since previous study. Moderate to severe dilatation of stomach and proximal small bowel is seen containing air-fluid levels. Transition point is seen in the right lateral abdomen adjacent to the ascending colon, and this is suspicious for adhesion. There is no evidence of focal inflammatory process, abscess, or free intraperitoneal air. Tiny amount of free fluid seen in the left paracolic gutter. Normal appendix visualized. Right-sided colonic diverticulosis is noted, however there is no evidence of diverticulitis. Vascular/Lymphatic: No pathologically enlarged lymph nodes. No acute vascular findings. Aortic atherosclerotic calcification noted. Reproductive: Prior hysterectomy noted. Adnexal regions are unremarkable in appearance. Other:  None. Musculoskeletal:  No suspicious bone lesions identified. IMPRESSION: Small bowel obstruction, with transition point in right lateral abdomen suspicious for adhesion. 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 Right-sided colonic diverticulosis, without radiographic evidence of diverticulitis. Mild right basilar atelectasis. Aortic Atherosclerosis (ICD10-I70.0). Electronically Signed   By: Marlaine Hind M.D.   On: 09/09/2020 16:29   CT ABDOMEN PELVIS W CONTRAST  Result Date: 09/01/2020 CLINICAL DATA:  Abdominal pain and distension, history of recent GI bleed with embolization EXAM: CT ABDOMEN AND PELVIS WITH CONTRAST TECHNIQUE: Multidetector CT imaging of the abdomen and pelvis was performed using the standard protocol following bolus administration of intravenous  contrast. CONTRAST:  157mL OMNIPAQUE IOHEXOL 350 MG/ML SOLN COMPARISON:  08/29/2020 FINDINGS: Lower chest: Mild bibasilar atelectasis is noted. 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 Hepatobiliary: The liver is diffusely fatty infiltrated. No focal mass lesion is seen. The gallbladder is within normal limits. Pancreas: Pancreas is unremarkable. Spleen: Normal in size without focal abnormality. Adrenals/Urinary Tract: Adrenal glands are within normal limits. Normal enhancement of the kidneys is seen. Normal excretion is noted bilaterally. Small cyst is noted in the left kidney stable from the previous exam. No obstructive changes are seen. The bladder is well distended. Stomach/Bowel: Diverticular changes noted within the colon. Changes of prior embolization are seen. The colon is predominately decompressed. The appendix is within normal limits. Considerable free air is noted within the anterior aspect of the abdomen.  The stomach is well distended with ingested food stuffs. Considerable dilatation of the jejunum is seen there is a caliber change noted in the mid abdomen best seen on image number 65 of series 3. The more distal small bowel appears within normal limits. Terminal ileum is unremarkable. Vascular/Lymphatic: Atherosclerotic calcifications of the aorta are noted. No aneurysmal dilatation is seen. Changes of prior IMA embolization are noted consistent with the given clinical history. The proximal IMA is within normal limits. SMA appears within normal limits as well. No significant lymphadenopathy is noted. Reproductive: Status post hysterectomy. No adnexal masses. Other: Mild free fluid is noted within the pelvis. This is likely related to the underlying perforation. Musculoskeletal: Degenerative changes of the lumbar spine are noted. IMPRESSION: Interval development of partial small bowel obstruction involving the distal jejunum with evidence of perforation with free  air and free fluid within the abdomen. These changes are new from the prior CT. A transition zone is noted in the mid abdomen although no definitive mass lesion is seen. Changes of prior embolization of the IMA branches. No findings to suggest active extravasation are seen. These results will be called to the ordering clinician or representative by the Radiologist Assistant, and communication documented in the PACS or [employer]. Electronically Signed   By: Inez Catalina M.D.   On: 09/01/2020 17:37   IR Angiogram Visceral Selective  Result Date: 08/30/2020 INDICATION: Persistent lower GI bleeding. Positive CTA. Please perform mesenteric arteriogram with potential percutaneous embolization. EXAM: 1. ULTRASOUND GUIDANCE FOR ARTERIAL ACCESS 2. SELECTIVE SUPERIOR MESENTERIC ARTERIOGRAM 3. SELECTIVE INFERIOR MESENTERIC ARTERIOGRAM 4. SUB SELECTIVE SUPERIOR SIGMOID ARTERIOGRAM 5. SUB SELECTIVE ARTERIOGRAM OF DISTAL ARCADE BRANCH OF THE SUPERIOR SIGMOIDAL ARTERY AND PERCUTANEOUS COIL EMBOLIZATION COMPARISON:  CTA abdomen and pelvis - earlier same day MEDICATIONS: None ANESTHESIA/SEDATION: Moderate (conscious) sedation was employed during this procedure. A total of Versed 0.5 mg and Fentanyl 25 mcg was administered intravenously. Moderate Sedation Time: 40 minutes. The patient's level of consciousness and vital signs were monitored continuously by radiology nursing throughout the procedure under my direct supervision. CONTRAST:  75 cc Omnipaque 300 FLUOROSCOPY TIME:  14 minutes, 54 seconds (0,962 mGy) COMPLICATIONS: None immediate. PROCEDURE: Informed consent was obtained from the patient and the patient's husband following explanation of the procedure, risks, benefits and alternatives. All questions were addressed. A time out was performed prior to the initiation of the procedure. Maximal barrier sterile technique utilized including caps, mask, sterile gowns, sterile gloves, large sterile drape, hand hygiene, and  Betadine prep. The right femoral head was marked fluoroscopically. Under sterile conditions and local anesthesia, the right common femoral artery access was performed with a micropuncture needle. Under direct ultrasound guidance, the right common femoral was accessed with a micropuncture kit. An ultrasound image was saved for documentation purposes. This allowed for placement of a 5-French vascular sheath. A limited arteriogram was performed through the side arm of the sheath confirming appropriate access within the right common femoral artery. Over a Bentson wire, a Mickelson catheter was advanced the caudal aspect of the thoracic aorta where was reformed, back bled and flushed. The Mickelson catheter was then utilized to select the superior mesenteric artery and a selective superior mesenteric arteriogram was performed. Next, the Mickelson catheter was utilized to select the IMA and a selective inferior mesenteric arteriogram was performed With the use of a fathom 14 microwire, a Renegade microcatheter was utilized to select the superior sigmoid artery and a sub selective arteriogram was performed. The microcatheter was advanced further  to select the distal arcade branch of the superior sigmoidal artery supplying the obvious area of intraluminal extravasation within the sigmoid colon, correlating with the findings seen on preceding CTA. Sub selective injection was performed and the vessel was percutaneously coil embolized with 2 overlapping 2 mm diameter interlock coils. The microcatheter was retracted to the level of the superior sigmoid artery and post embolization arteriogram was performed. The microcatheter was then removed and a completion inferior mesenteric arteriogram was performed from the level of the Mickelson catheter. Images reviewed and the procedure was terminated. All wires, catheters and sheaths were removed from the patient. Hemostasis was achieved at the right groin access site with deployment  of an ExoSeal closure device. The patient tolerated the procedure well without immediate post procedural complication. FINDINGS: Selective superior mesenteric arteriogram demonstrates conventional branching pattern and was negative for discrete area of vessel irregularity or contrast extravasation Selective inferior mesenteric arteriogram demonstrates an obvious area of contrast extravasation involving the superior aspect of the sigmoid colon at the level of the left ilium, correlating with the ill-defined area of intraluminal contrast extravasation on preceding CTA (representative image 134, series 5). Sub selective arteriogram of the superior sigmoidal artery demonstrates a distal arcade branch supplying the ill-defined area of intraluminal contrast extravasation. The proximal aspect of this distal arcade was cannulated and subsequent percutaneously coil embolized with interlock coils. Post embolization arteriograms performed from the level of the superior sigmoidal artery as well as the inferior mesenteric artery were negative for residual area of intraluminal contrast extravasation or vessel irregularity. IMPRESSION: Technically successful percutaneous coil embolization of a distal arcade branch of the superior sigmoidal artery supplying an obvious area of intraluminal contrast extravasation within the proximal sigmoid colon correlating with the findings on preceding CTA. PLAN: - The patient is to remain flat for 4 hours with right leg straight. - The patient will continue to experience several additional bloody bowel movements and may continue to require additional resuscitation (as he was bleeding both before and during the procedure), however ultimately I am hopeful he will stabilize in the coming days. - If there remains clinical concern for persistent lower GI bleeding, would recommend coordinating with the interventional radiology service to perform a nuclear medicine tagged red blood cell study during  daytime working hours as to expedite potential repeat angiography if the tagged study is positive. - While presumably secondary to diverticular disease, repeat colonoscopy after the resolution of acute symptoms is advised to exclude the presence of an underlying mass/lesion. Electronically Signed   By: Sandi Mariscal M.D.   On: 08/30/2020 08:40   IR Angiogram Visceral Selective  Result Date: 08/30/2020 INDICATION: Persistent lower GI bleeding. Positive CTA. Please perform mesenteric arteriogram with potential percutaneous embolization. EXAM: 1. ULTRASOUND GUIDANCE FOR ARTERIAL ACCESS 2. SELECTIVE SUPERIOR MESENTERIC ARTERIOGRAM 3. SELECTIVE INFERIOR MESENTERIC ARTERIOGRAM 4. SUB SELECTIVE SUPERIOR SIGMOID ARTERIOGRAM 5. SUB SELECTIVE ARTERIOGRAM OF DISTAL ARCADE BRANCH OF THE SUPERIOR SIGMOIDAL ARTERY AND PERCUTANEOUS COIL EMBOLIZATION COMPARISON:  CTA abdomen and pelvis - earlier same day MEDICATIONS: None ANESTHESIA/SEDATION: Moderate (conscious) sedation was employed during this procedure. A total of Versed 0.5 mg and Fentanyl 25 mcg was administered intravenously. Moderate Sedation Time: 40 minutes. The patient's level of consciousness and vital signs were monitored continuously by radiology nursing throughout the procedure under my direct supervision. CONTRAST:  75 cc Omnipaque 300 FLUOROSCOPY TIME:  14 minutes, 54 seconds (9,563 mGy) COMPLICATIONS: None immediate. PROCEDURE: Informed consent was obtained from the patient and the patient's husband following explanation  of the procedure, risks, benefits and alternatives. All questions were addressed. A time out was performed prior to the initiation of the procedure. Maximal barrier sterile technique utilized including caps, mask, sterile gowns, sterile gloves, large sterile drape, hand hygiene, and Betadine prep. The right femoral head was marked fluoroscopically. Under sterile conditions and local anesthesia, the right common femoral artery access was  performed with a micropuncture needle. Under direct ultrasound guidance, the right common femoral was accessed with a micropuncture kit. An ultrasound image was saved for documentation purposes. This allowed for placement of a 5-French vascular sheath. A limited arteriogram was performed through the side arm of the sheath confirming appropriate access within the right common femoral artery. Over a Bentson wire, a Mickelson catheter was advanced the caudal aspect of the thoracic aorta where was reformed, back bled and flushed. The Mickelson catheter was then utilized to select the superior mesenteric artery and a selective superior mesenteric arteriogram was performed. Next, the Mickelson catheter was utilized to select the IMA and a selective inferior mesenteric arteriogram was performed With the use of a fathom 14 microwire, a Renegade microcatheter was utilized to select the superior sigmoid artery and a sub selective arteriogram was performed. The microcatheter was advanced further to select the distal arcade branch of the superior sigmoidal artery supplying the obvious area of intraluminal extravasation within the sigmoid colon, correlating with the findings seen on preceding CTA. Sub selective injection was performed and the vessel was percutaneously coil embolized with 2 overlapping 2 mm diameter interlock coils. The microcatheter was retracted to the level of the superior sigmoid artery and post embolization arteriogram was performed. The microcatheter was then removed and a completion inferior mesenteric arteriogram was performed from the level of the Mickelson catheter. Images reviewed and the procedure was terminated. All wires, catheters and sheaths were removed from the patient. Hemostasis was achieved at the right groin access site with deployment of an ExoSeal closure device. The patient tolerated the procedure well without immediate post procedural complication. FINDINGS: Selective superior mesenteric  arteriogram demonstrates conventional branching pattern and was negative for discrete area of vessel irregularity or contrast extravasation Selective inferior mesenteric arteriogram demonstrates an obvious area of contrast extravasation involving the superior aspect of the sigmoid colon at the level of the left ilium, correlating with the ill-defined area of intraluminal contrast extravasation on preceding CTA (representative image 134, series 5). Sub selective arteriogram of the superior sigmoidal artery demonstrates a distal arcade branch supplying the ill-defined area of intraluminal contrast extravasation. The proximal aspect of this distal arcade was cannulated and subsequent percutaneously coil embolized with interlock coils. Post embolization arteriograms performed from the level of the superior sigmoidal artery as well as the inferior mesenteric artery were negative for residual area of intraluminal contrast extravasation or vessel irregularity. IMPRESSION: Technically successful percutaneous coil embolization of a distal arcade branch of the superior sigmoidal artery supplying an obvious area of intraluminal contrast extravasation within the proximal sigmoid colon correlating with the findings on preceding CTA. PLAN: - The patient is to remain flat for 4 hours with right leg straight. - The patient will continue to experience several additional bloody bowel movements and may continue to require additional resuscitation (as he was bleeding both before and during the procedure), however ultimately I am hopeful he will stabilize in the coming days. - If there remains clinical concern for persistent lower GI bleeding, would recommend coordinating with the interventional radiology service to perform a nuclear medicine tagged red blood cell study  during daytime working hours as to expedite potential repeat angiography if the tagged study is positive. - While presumably secondary to diverticular disease, repeat  colonoscopy after the resolution of acute symptoms is advised to exclude the presence of an underlying mass/lesion. Electronically Signed   By: Sandi Mariscal M.D.   On: 08/30/2020 08:40   IR Angiogram Selective Each Additional Vessel  Result Date: 08/30/2020 INDICATION: Persistent lower GI bleeding. Positive CTA. Please perform mesenteric arteriogram with potential percutaneous embolization. EXAM: 1. ULTRASOUND GUIDANCE FOR ARTERIAL ACCESS 2. SELECTIVE SUPERIOR MESENTERIC ARTERIOGRAM 3. SELECTIVE INFERIOR MESENTERIC ARTERIOGRAM 4. SUB SELECTIVE SUPERIOR SIGMOID ARTERIOGRAM 5. SUB SELECTIVE ARTERIOGRAM OF DISTAL ARCADE BRANCH OF THE SUPERIOR SIGMOIDAL ARTERY AND PERCUTANEOUS COIL EMBOLIZATION COMPARISON:  CTA abdomen and pelvis - earlier same day MEDICATIONS: None ANESTHESIA/SEDATION: Moderate (conscious) sedation was employed during this procedure. A total of Versed 0.5 mg and Fentanyl 25 mcg was administered intravenously. Moderate Sedation Time: 40 minutes. The patient's level of consciousness and vital signs were monitored continuously by radiology nursing throughout the procedure under my direct supervision. CONTRAST:  75 cc Omnipaque 300 FLUOROSCOPY TIME:  14 minutes, 54 seconds (9,163 mGy) COMPLICATIONS: None immediate. PROCEDURE: Informed consent was obtained from the patient and the patient's husband following explanation of the procedure, risks, benefits and alternatives. All questions were addressed. A time out was performed prior to the initiation of the procedure. Maximal barrier sterile technique utilized including caps, mask, sterile gowns, sterile gloves, large sterile drape, hand hygiene, and Betadine prep. The right femoral head was marked fluoroscopically. Under sterile conditions and local anesthesia, the right common femoral artery access was performed with a micropuncture needle. Under direct ultrasound guidance, the right common femoral was accessed with a micropuncture kit. An ultrasound  image was saved for documentation purposes. This allowed for placement of a 5-French vascular sheath. A limited arteriogram was performed through the side arm of the sheath confirming appropriate access within the right common femoral artery. Over a Bentson wire, a Mickelson catheter was advanced the caudal aspect of the thoracic aorta where was reformed, back bled and flushed. The Mickelson catheter was then utilized to select the superior mesenteric artery and a selective superior mesenteric arteriogram was performed. Next, the Mickelson catheter was utilized to select the IMA and a selective inferior mesenteric arteriogram was performed With the use of a fathom 14 microwire, a Renegade microcatheter was utilized to select the superior sigmoid artery and a sub selective arteriogram was performed. The microcatheter was advanced further to select the distal arcade branch of the superior sigmoidal artery supplying the obvious area of intraluminal extravasation within the sigmoid colon, correlating with the findings seen on preceding CTA. Sub selective injection was performed and the vessel was percutaneously coil embolized with 2 overlapping 2 mm diameter interlock coils. The microcatheter was retracted to the level of the superior sigmoid artery and post embolization arteriogram was performed. The microcatheter was then removed and a completion inferior mesenteric arteriogram was performed from the level of the Mickelson catheter. Images reviewed and the procedure was terminated. All wires, catheters and sheaths were removed from the patient. Hemostasis was achieved at the right groin access site with deployment of an ExoSeal closure device. The patient tolerated the procedure well without immediate post procedural complication. FINDINGS: Selective superior mesenteric arteriogram demonstrates conventional branching pattern and was negative for discrete area of vessel irregularity or contrast extravasation Selective  inferior mesenteric arteriogram demonstrates an obvious area of contrast extravasation involving the superior aspect of the sigmoid  colon at the level of the left ilium, correlating with the ill-defined area of intraluminal contrast extravasation on preceding CTA (representative image 134, series 5). Sub selective arteriogram of the superior sigmoidal artery demonstrates a distal arcade branch supplying the ill-defined area of intraluminal contrast extravasation. The proximal aspect of this distal arcade was cannulated and subsequent percutaneously coil embolized with interlock coils. Post embolization arteriograms performed from the level of the superior sigmoidal artery as well as the inferior mesenteric artery were negative for residual area of intraluminal contrast extravasation or vessel irregularity. IMPRESSION: Technically successful percutaneous coil embolization of a distal arcade branch of the superior sigmoidal artery supplying an obvious area of intraluminal contrast extravasation within the proximal sigmoid colon correlating with the findings on preceding CTA. PLAN: - The patient is to remain flat for 4 hours with right leg straight. - The patient will continue to experience several additional bloody bowel movements and may continue to require additional resuscitation (as he was bleeding both before and during the procedure), however ultimately I am hopeful he will stabilize in the coming days. - If there remains clinical concern for persistent lower GI bleeding, would recommend coordinating with the interventional radiology service to perform a nuclear medicine tagged red blood cell study during daytime working hours as to expedite potential repeat angiography if the tagged study is positive. - While presumably secondary to diverticular disease, repeat colonoscopy after the resolution of acute symptoms is advised to exclude the presence of an underlying mass/lesion. Electronically Signed   By: Sandi Mariscal M.D.   On: 08/30/2020 08:40   IR Angiogram Follow Up Study  Result Date: 08/30/2020 INDICATION: Persistent lower GI bleeding. Positive CTA. Please perform mesenteric arteriogram with potential percutaneous embolization. EXAM: 1. ULTRASOUND GUIDANCE FOR ARTERIAL ACCESS 2. SELECTIVE SUPERIOR MESENTERIC ARTERIOGRAM 3. SELECTIVE INFERIOR MESENTERIC ARTERIOGRAM 4. SUB SELECTIVE SUPERIOR SIGMOID ARTERIOGRAM 5. SUB SELECTIVE ARTERIOGRAM OF DISTAL ARCADE BRANCH OF THE SUPERIOR SIGMOIDAL ARTERY AND PERCUTANEOUS COIL EMBOLIZATION COMPARISON:  CTA abdomen and pelvis - earlier same day MEDICATIONS: None ANESTHESIA/SEDATION: Moderate (conscious) sedation was employed during this procedure. A total of Versed 0.5 mg and Fentanyl 25 mcg was administered intravenously. Moderate Sedation Time: 40 minutes. The patient's level of consciousness and vital signs were monitored continuously by radiology nursing throughout the procedure under my direct supervision. CONTRAST:  75 cc Omnipaque 300 FLUOROSCOPY TIME:  14 minutes, 54 seconds (9,794 mGy) COMPLICATIONS: None immediate. PROCEDURE: Informed consent was obtained from the patient and the patient's husband following explanation of the procedure, risks, benefits and alternatives. All questions were addressed. A time out was performed prior to the initiation of the procedure. Maximal barrier sterile technique utilized including caps, mask, sterile gowns, sterile gloves, large sterile drape, hand hygiene, and Betadine prep. The right femoral head was marked fluoroscopically. Under sterile conditions and local anesthesia, the right common femoral artery access was performed with a micropuncture needle. Under direct ultrasound guidance, the right common femoral was accessed with a micropuncture kit. An ultrasound image was saved for documentation purposes. This allowed for placement of a 5-French vascular sheath. A limited arteriogram was performed through the side arm of the  sheath confirming appropriate access within the right common femoral artery. Over a Bentson wire, a Mickelson catheter was advanced the caudal aspect of the thoracic aorta where was reformed, back bled and flushed. The Mickelson catheter was then utilized to select the superior mesenteric artery and a selective superior mesenteric arteriogram was performed. Next, the [facility] catheter was utilized to  select the IMA and a selective inferior mesenteric arteriogram was performed With the use of a fathom 14 microwire, a Renegade microcatheter was utilized to select the superior sigmoid artery and a sub selective arteriogram was performed. The microcatheter was advanced further to select the distal arcade branch of the superior sigmoidal artery supplying the obvious area of intraluminal extravasation within the sigmoid colon, correlating with the findings seen on preceding CTA. Sub selective injection was performed and the vessel was percutaneously coil embolized with 2 overlapping 2 mm diameter interlock coils. The microcatheter was retracted to the level of the superior sigmoid artery and post embolization arteriogram was performed. The microcatheter was then removed and a completion inferior mesenteric arteriogram was performed from the level of the Mickelson catheter. Images reviewed and the procedure was terminated. All wires, catheters and sheaths were removed from the patient. Hemostasis was achieved at the right groin access site with deployment of an ExoSeal closure device. The patient tolerated the procedure well without immediate post procedural complication. FINDINGS: Selective superior mesenteric arteriogram demonstrates conventional branching pattern and was negative for discrete area of vessel irregularity or contrast extravasation Selective inferior mesenteric arteriogram demonstrates an obvious area of contrast extravasation involving the superior aspect of the sigmoid colon at the level of the left  ilium, correlating with the ill-defined area of intraluminal contrast extravasation on preceding CTA (representative image 134, series 5). Sub selective arteriogram of the superior sigmoidal artery demonstrates a distal arcade branch supplying the ill-defined area of intraluminal contrast extravasation. The proximal aspect of this distal arcade was cannulated and subsequent percutaneously coil embolized with interlock coils. Post embolization arteriograms performed from the level of the superior sigmoidal artery as well as the inferior mesenteric artery were negative for residual area of intraluminal contrast extravasation or vessel irregularity. IMPRESSION: Technically successful percutaneous coil embolization of a distal arcade branch of the superior sigmoidal artery supplying an obvious area of intraluminal contrast extravasation within the proximal sigmoid colon correlating with the findings on preceding CTA. PLAN: - The patient is to remain flat for 4 hours with right leg straight. - The patient will continue to experience several additional bloody bowel movements and may continue to require additional resuscitation (as he was bleeding both before and during the procedure), however ultimately I am hopeful he will stabilize in the coming days. - If there remains clinical concern for persistent lower GI bleeding, would recommend coordinating with the interventional radiology service to perform a nuclear medicine tagged red blood cell study during daytime working hours as to expedite potential repeat angiography if the tagged study is positive. - While presumably secondary to diverticular disease, repeat colonoscopy after the resolution of acute symptoms is advised to exclude the presence of an underlying mass/lesion. Electronically Signed   By: Sandi Mariscal M.D.   On: 08/30/2020 08:40   IR US Guide Vasc Access Right  Result Date: 08/30/2020 INDICATION: Persistent lower GI bleeding. Positive CTA. Please perform  mesenteric arteriogram with potential percutaneous embolization. EXAM: 1. ULTRASOUND GUIDANCE FOR ARTERIAL ACCESS 2. SELECTIVE SUPERIOR MESENTERIC ARTERIOGRAM 3. SELECTIVE INFERIOR MESENTERIC ARTERIOGRAM 4. SUB SELECTIVE SUPERIOR SIGMOID ARTERIOGRAM 5. SUB SELECTIVE ARTERIOGRAM OF DISTAL ARCADE BRANCH OF THE SUPERIOR SIGMOIDAL ARTERY AND PERCUTANEOUS COIL EMBOLIZATION COMPARISON:  CTA abdomen and pelvis - earlier same day MEDICATIONS: None ANESTHESIA/SEDATION: Moderate (conscious) sedation was employed during this procedure. A total of Versed 0.5 mg and Fentanyl 25 mcg was administered intravenously. Moderate Sedation Time: 40 minutes. The patient's level of consciousness and vital signs were  monitored continuously by radiology nursing throughout the procedure under my direct supervision. CONTRAST:  75 cc Omnipaque 300 FLUOROSCOPY TIME:  14 minutes, 54 seconds (5,277 mGy) COMPLICATIONS: None immediate. PROCEDURE: Informed consent was obtained from the patient and the patient's husband following explanation of the procedure, risks, benefits and alternatives. All questions were addressed. A time out was performed prior to the initiation of the procedure. Maximal barrier sterile technique utilized including caps, mask, sterile gowns, sterile gloves, large sterile drape, hand hygiene, and Betadine prep. The right femoral head was marked fluoroscopically. Under sterile conditions and local anesthesia, the right common femoral artery access was performed with a micropuncture needle. Under direct ultrasound guidance, the right common femoral was accessed with a micropuncture kit. An ultrasound image was saved for documentation purposes. This allowed for placement of a 5-French vascular sheath. A limited arteriogram was performed through the side arm of the sheath confirming appropriate access within the right common femoral artery. Over a Bentson wire, a Mickelson catheter was advanced the caudal aspect of the thoracic  aorta where was reformed, back bled and flushed. The Mickelson catheter was then utilized to select the superior mesenteric artery and a selective superior mesenteric arteriogram was performed. Next, the Mickelson catheter was utilized to select the IMA and a selective inferior mesenteric arteriogram was performed With the use of a fathom 14 microwire, a Renegade microcatheter was utilized to select the superior sigmoid artery and a sub selective arteriogram was performed. The microcatheter was advanced further to select the distal arcade branch of the superior sigmoidal artery supplying the obvious area of intraluminal extravasation within the sigmoid colon, correlating with the findings seen on preceding CTA. Sub selective injection was performed and the vessel was percutaneously coil embolized with 2 overlapping 2 mm diameter interlock coils. The microcatheter was retracted to the level of the superior sigmoid artery and post embolization arteriogram was performed. The microcatheter was then removed and a completion inferior mesenteric arteriogram was performed from the level of the Mickelson catheter. Images reviewed and the procedure was terminated. All wires, catheters and sheaths were removed from the patient. Hemostasis was achieved at the right groin access site with deployment of an ExoSeal closure device. The patient tolerated the procedure well without immediate post procedural complication. FINDINGS: Selective superior mesenteric arteriogram demonstrates conventional branching pattern and was negative for discrete area of vessel irregularity or contrast extravasation Selective inferior mesenteric arteriogram demonstrates an obvious area of contrast extravasation involving the superior aspect of the sigmoid colon at the level of the left ilium, correlating with the ill-defined area of intraluminal contrast extravasation on preceding CTA (representative image 134, series 5). Sub selective arteriogram of  the superior sigmoidal artery demonstrates a distal arcade branch supplying the ill-defined area of intraluminal contrast extravasation. The proximal aspect of this distal arcade was cannulated and subsequent percutaneously coil embolized with interlock coils. Post embolization arteriograms performed from the level of the superior sigmoidal artery as well as the inferior mesenteric artery were negative for residual area of intraluminal contrast extravasation or vessel irregularity. IMPRESSION: Technically successful percutaneous coil embolization of a distal arcade branch of the superior sigmoidal artery supplying an obvious area of intraluminal contrast extravasation within the proximal sigmoid colon correlating with the findings on preceding CTA. PLAN: - The patient is to remain flat for 4 hours with right leg straight. - The patient will continue to experience several additional bloody bowel movements and may continue to require additional resuscitation (as he was bleeding both before and  during the procedure), however ultimately I am hopeful he will stabilize in the coming days. - If there remains clinical concern for persistent lower GI bleeding, would recommend coordinating with the interventional radiology service to perform a nuclear medicine tagged red blood cell study during daytime working hours as to expedite potential repeat angiography if the tagged study is positive. - While presumably secondary to diverticular disease, repeat colonoscopy after the resolution of acute symptoms is advised to exclude the presence of an underlying mass/lesion. Electronically Signed   By: Sandi Mariscal M.D.   On: 08/30/2020 08:40   DG Chest Port 1 View  Result Date: 08/30/2020 CLINICAL DATA:  Respiratory failure EXAM: PORTABLE CHEST 1 VIEW COMPARISON:  01/14/2020 FINDINGS: The heart size and mediastinal contours are within normal limits. Both lungs are clear. The visualized skeletal structures are unremarkable.  IMPRESSION: No active disease. Electronically Signed   By: Fidela Salisbury MD   On: 08/30/2020 05:17   DG Abd Portable 1V  Result Date: 09/22/2020 CLINICAL DATA:  Follow-up small bowel obstruction. EXAM: PORTABLE ABDOMEN - 1 VIEW COMPARISON:  09/21/2020 FINDINGS: There has been a interval decrease in the degree of small-bowel dilation although significant dilation persists. Nasogastric tube is well positioned, tip in the distal stomach. IMPRESSION: 1. Decrease degree of small-bowel distention since the previous day's exam, although significant distension persists. Findings remain consistent with a partial small bowel obstruction. Electronically Signed   By: Lajean Manes M.D.   On: 09/22/2020 09:19   DG Abd Portable 1V  Result Date: 09/21/2020 CLINICAL DATA:  NG tube placement EXAM: PORTABLE ABDOMEN - 1 VIEW COMPARISON:  Abdominal radiograph 09/20/2020 CT 09/20/2020 FINDINGS: Transesophageal tube tip and side port terminate in the left upper quadrant beyond the GE junction. Diffusely distended loops of bowel throughout the abdomen with regions of pneumatosis. No suspicious abdominal calcifications. Lung bases are clear. No acute osseous or soft tissue abnormality. Multilevel degenerative changes are present in the imaged portions of the spine. Additional degenerative changes in the pelvis. IMPRESSION: Does not positioning of the transesophageal tube. Diffuse bowel distension compatible with persisting obstruction with pneumatosis as seen on recent CT. Electronically Signed   By: Lovena Le M.D.   On: 09/21/2020 06:29   DG Abd Portable 1V  Result Date: 09/14/2020 CLINICAL DATA:  Ileus EXAM: PORTABLE ABDOMEN - 1 VIEW COMPARISON:  September 11, 2020 FINDINGS: Nasogastric tube no longer appreciable. There is generalized bowel dilatation without appreciable air-fluid levels on supine examination. No free air evident on supine examination. No abnormal calcifications. IMPRESSION: Nasogastric tube no longer evident.  Probable ileus, although enteritis or a degree of bowel obstruction could present in this manner. No free air evident on supine examination. Electronically Signed   By: Lowella Grip III M.D.   On: 09/14/2020 09:18   DG Abd Portable 1V  Result Date: 09/11/2020 CLINICAL DATA:  Ileus felling gastrointestinal surgery. EXAM: PORTABLE ABDOMEN - 1 VIEW COMPARISON:  Radiograph yesterday FINDINGS: The enteric tube is been retracted, the side port is now in the region of the gastroesophageal junction. Recommend advancement of at least 2-3 cm for optimal placement. Decreased gaseous gastric distension from prior exam. Persistent gaseous distension of small bowel in the central abdomen up to 5.1 cm. Small amount of contrast in the ascending colon with small to moderate colonic stool burden. IMPRESSION: 1. Partial retraction of the enteric tube, side-port now in the region of the gastroesophageal junction. Recommend advancement of 2-3 cm for optimal placement. 2. Persistent gaseous small  bowel distension suggesting obstruction or ileus. Electronically Signed   By: Keith Rake M.D.   On: 09/11/2020 15:14   IR EMBO ART  VEN HEMORR LYMPH EXTRAV  INC GUIDE ROADMAPPING  Result Date: 08/30/2020 INDICATION: Persistent lower GI bleeding. Positive CTA. Please perform mesenteric arteriogram with potential percutaneous embolization. EXAM: 1. ULTRASOUND GUIDANCE FOR ARTERIAL ACCESS 2. SELECTIVE SUPERIOR MESENTERIC ARTERIOGRAM 3. SELECTIVE INFERIOR MESENTERIC ARTERIOGRAM 4. SUB SELECTIVE SUPERIOR SIGMOID ARTERIOGRAM 5. SUB SELECTIVE ARTERIOGRAM OF DISTAL ARCADE BRANCH OF THE SUPERIOR SIGMOIDAL ARTERY AND PERCUTANEOUS COIL EMBOLIZATION COMPARISON:  CTA abdomen and pelvis - earlier same day MEDICATIONS: None ANESTHESIA/SEDATION: Moderate (conscious) sedation was employed during this procedure. A total of Versed 0.5 mg and Fentanyl 25 mcg was administered intravenously. Moderate Sedation Time: 40 minutes. The patient's level  of consciousness and vital signs were monitored continuously by radiology nursing throughout the procedure under my direct supervision. CONTRAST:  75 cc Omnipaque 300 FLUOROSCOPY TIME:  14 minutes, 54 seconds (7,026 mGy) COMPLICATIONS: None immediate. PROCEDURE: Informed consent was obtained from the patient and the patient's husband following explanation of the procedure, risks, benefits and alternatives. All questions were addressed. A time out was performed prior to the initiation of the procedure. Maximal barrier sterile technique utilized including caps, mask, sterile gowns, sterile gloves, large sterile drape, hand hygiene, and Betadine prep. The right femoral head was marked fluoroscopically. Under sterile conditions and local anesthesia, the right common femoral artery access was performed with a micropuncture needle. Under direct ultrasound guidance, the right common femoral was accessed with a micropuncture kit. An ultrasound image was saved for documentation purposes. This allowed for placement of a 5-French vascular sheath. A limited arteriogram was performed through the side arm of the sheath confirming appropriate access within the right common femoral artery. Over a Bentson wire, a Mickelson catheter was advanced the caudal aspect of the thoracic aorta where was reformed, back bled and flushed. The Mickelson catheter was then utilized to select the superior mesenteric artery and a selective superior mesenteric arteriogram was performed. Next, the Mickelson catheter was utilized to select the IMA and a selective inferior mesenteric arteriogram was performed With the use of a fathom 14 microwire, a Renegade microcatheter was utilized to select the superior sigmoid artery and a sub selective arteriogram was performed. The microcatheter was advanced further to select the distal arcade branch of the superior sigmoidal artery supplying the obvious area of intraluminal extravasation within the sigmoid colon,  correlating with the findings seen on preceding CTA. Sub selective injection was performed and the vessel was percutaneously coil embolized with 2 overlapping 2 mm diameter interlock coils. The microcatheter was retracted to the level of the superior sigmoid artery and post embolization arteriogram was performed. The microcatheter was then removed and a completion inferior mesenteric arteriogram was performed from the level of the Mickelson catheter. Images reviewed and the procedure was terminated. All wires, catheters and sheaths were removed from the patient. Hemostasis was achieved at the right groin access site with deployment of an ExoSeal closure device. The patient tolerated the procedure well without immediate post procedural complication. FINDINGS: Selective superior mesenteric arteriogram demonstrates conventional branching pattern and was negative for discrete area of vessel irregularity or contrast extravasation Selective inferior mesenteric arteriogram demonstrates an obvious area of contrast extravasation involving the superior aspect of the sigmoid colon at the level of the left ilium, correlating with the ill-defined area of intraluminal contrast extravasation on preceding CTA (representative image 134, series 5). Sub selective arteriogram of the  superior sigmoidal artery demonstrates a distal arcade branch supplying the ill-defined area of intraluminal contrast extravasation. The proximal aspect of this distal arcade was cannulated and subsequent percutaneously coil embolized with interlock coils. Post embolization arteriograms performed from the level of the superior sigmoidal artery as well as the inferior mesenteric artery were negative for residual area of intraluminal contrast extravasation or vessel irregularity. IMPRESSION: Technically successful percutaneous coil embolization of a distal arcade branch of the superior sigmoidal artery supplying an obvious area of intraluminal contrast  extravasation within the proximal sigmoid colon correlating with the findings on preceding CTA. PLAN: - The patient is to remain flat for 4 hours with right leg straight. - The patient will continue to experience several additional bloody bowel movements and may continue to require additional resuscitation (as he was bleeding both before and during the procedure), however ultimately I am hopeful he will stabilize in the coming days. - If there remains clinical concern for persistent lower GI bleeding, would recommend coordinating with the interventional radiology service to perform a nuclear medicine tagged red blood cell study during daytime working hours as to expedite potential repeat angiography if the tagged study is positive. - While presumably secondary to diverticular disease, repeat colonoscopy after the resolution of acute symptoms is advised to exclude the presence of an underlying mass/lesion. Electronically Signed   By: Sandi Mariscal M.D.   On: 08/30/2020 08:40   VAS [place] LOWER EXTREMITY VENOUS (DVT)  Result Date: 09/21/2020  Lower Venous DVT Study Patient Name:  Rhonda [name] [facility]  Date of Exam:   09/21/2020 Medical Rec #: [id number]       Accession #:    [id number] Date of Birth: 18-Mar-1961       Patient Gender: F Patient Age:   26Y Exam Location:  [facility] Procedure:      VAS [place] LOWER EXTREMITY VENOUS (DVT) Referring Phys: [id number] BELKYS A REGALADO --------------------------------------------------------------------------------  Indications: Edema.  Risk Factors: None identified. Limitations: Body habitus and poor ultrasound/tissue interface. Comparison Study: No prior studies. Performing Technologist: Oliver Hum RVT  Examination Guidelines: A complete evaluation includes B-mode imaging, spectral Doppler, color Doppler, and power Doppler as needed of all accessible portions of each vessel. Bilateral testing is considered an integral part of a complete examination. Limited examinations for  reoccurring indications may be performed as noted. The reflux portion of the exam is performed with the patient in reverse Trendelenburg.  +---------+---------------+---------+-----------+----------+-------------------+ RIGHT    CompressibilityPhasicitySpontaneityPropertiesThrombus Aging      +---------+---------------+---------+-----------+----------+-------------------+ CFV      Full           Yes      Yes                                      +---------+---------------+---------+-----------+----------+-------------------+ SFJ      Full                                                             +---------+---------------+---------+-----------+----------+-------------------+ FV Prox  Full                                                             +---------+---------------+---------+-----------+----------+-------------------+  FV Mid   Full                                                             +---------+---------------+---------+-----------+----------+-------------------+ FV DistalFull                                                             +---------+---------------+---------+-----------+----------+-------------------+ PFV      Full                                                             +---------+---------------+---------+-----------+----------+-------------------+ POP      Full           Yes      Yes                                      +---------+---------------+---------+-----------+----------+-------------------+ PTV      Full                                                             +---------+---------------+---------+-----------+----------+-------------------+ PERO                                                  Not well visualized +---------+---------------+---------+-----------+----------+-------------------+   +---------+---------------+---------+-----------+----------+--------------+ LEFT      CompressibilityPhasicitySpontaneityPropertiesThrombus Aging +---------+---------------+---------+-----------+----------+--------------+ CFV      Full           Yes      Yes                                 +---------+---------------+---------+-----------+----------+--------------+ SFJ      Full                                                        +---------+---------------+---------+-----------+----------+--------------+ FV Prox  Full                                                        +---------+---------------+---------+-----------+----------+--------------+ FV Mid   Full                                                        +---------+---------------+---------+-----------+----------+--------------+  FV DistalFull                                                        +---------+---------------+---------+-----------+----------+--------------+ PFV      Full                                                        +---------+---------------+---------+-----------+----------+--------------+ POP      Full           Yes      Yes                                 +---------+---------------+---------+-----------+----------+--------------+ PTV      Full                                                        +---------+---------------+---------+-----------+----------+--------------+ PERO     Full                                                        +---------+---------------+---------+-----------+----------+--------------+     Summary: RIGHT: - There is no evidence of deep vein thrombosis in the lower extremity. However, portions of this examination were limited- see technologist comments above.  - No cystic structure found in the popliteal fossa.  LEFT: - There is no evidence of deep vein thrombosis in the lower extremity. However, portions of this examination were limited- see technologist comments above.  - No cystic structure found in the popliteal  fossa.  *See table(s) above for measurements and observations. Electronically signed by Ruta Hinds MD on 09/21/2020 at 5:35:10 PM.    Final    [place] EKG SITE RITE  Result Date: 09/06/2020 If Site Rite image not attached, placement could not be confirmed due to current cardiac rhythm.  CT Angio Abd/Pel W and/or Wo Contrast  Result Date: 08/29/2020 CLINICAL DATA:  Gastrointestinal bleeding. EXAM: CTA ABDOMEN AND PELVIS WITHOUT AND WITH CONTRAST TECHNIQUE: Multidetector CT imaging of the abdomen and pelvis was performed using the standard protocol during bolus administration of intravenous contrast. Multiplanar reconstructed images and MIPs were obtained and reviewed to evaluate the vascular anatomy. CONTRAST:  126mL OMNIPAQUE IOHEXOL 350 MG/ML SOLN COMPARISON:  July 23, 2020. FINDINGS: VASCULAR Aorta: Normal caliber aorta without aneurysm, dissection, vasculitis or significant stenosis. Celiac: Patent without evidence of aneurysm, dissection, vasculitis or significant stenosis. SMA: Patent without evidence of aneurysm, dissection, vasculitis or significant stenosis. Renals: Both renal arteries are patent without evidence of aneurysm, dissection, vasculitis, fibromuscular dysplasia or significant stenosis. IMA: Patent without evidence of aneurysm, dissection, vasculitis or significant stenosis. Inflow: Patent without evidence of aneurysm, dissection, vasculitis or significant stenosis. Proximal Outflow: Bilateral common femoral and visualized portions of the superficial and profunda femoral arteries are patent without evidence of aneurysm, dissection,  vasculitis or significant stenosis. Veins: No obvious venous abnormality within the limitations of this arterial phase study. Review of the MIP images confirms the above findings. NON-VASCULAR Lower chest: No acute abnormality. Hepatobiliary: No focal liver abnormality is seen. No gallstones, gallbladder wall thickening, or biliary dilatation. Pancreas:  Unremarkable. No pancreatic ductal dilatation or surrounding inflammatory changes. Spleen: Normal in size without focal abnormality. Adrenals/Urinary Tract: Adrenal glands are unremarkable. Kidneys are normal, without renal calculi, focal lesion, or hydronephrosis. Bladder is unremarkable. Stomach/Bowel: The stomach is unremarkable. Diverticulosis of descending and sigmoid colon is noted. Is no evidence of bowel obstruction or inflammation. However, there does appear to be some degree of contrast extravasation involving the distal descending colon concerning for active gastrointestinal hemorrhage. This is best seen on image number 141 of series 6. Lymphatic: No significant adenopathy is noted. Reproductive: Status post hysterectomy. No adnexal masses. Other: No abdominal wall hernia or abnormality. No abdominopelvic ascites. Musculoskeletal: No fracture is seen. IMPRESSION: VASCULAR Contrast extravasation is seen involving the distal descending colon concerning for active gastrointestinal hemorrhage. Critical Value/emergent results were called by telephone at the time of interpretation on 08/29/2020 at 9:37 pm to provider [facility] , who verbally acknowledged these results. There is no evidence of abdominal aortic dissection or aneurysm. No definite mesenteric or renal artery stenosis is noted. NON-VASCULAR Sigmoid diverticulosis without inflammation. Aortic Atherosclerosis (ICD10-I70.0). Electronically Signed   By: Marijo Conception M.D.   On: 08/29/2020 21:37     The results of significant diagnostics from this hospitalization (including imaging, microbiology, ancillary and laboratory) are listed below for reference.     Microbiology: Recent Results (from the past 240 hour(s))  SARS CORONAVIRUS 2 (TAT 6-24 HRS) Nasopharyngeal Nasopharyngeal Swab     Status: None   Collection Time: 09/27/20  1:10 PM   Specimen: Nasopharyngeal Swab  Result Value Ref Range Status   SARS Coronavirus 2 NEGATIVE NEGATIVE  Final    Comment: (NOTE) SARS-CoV-2 target nucleic acids are NOT DETECTED.  The SARS-CoV-2 RNA is generally detectable in upper and lower respiratory specimens during the acute phase of infection. Negative results do not preclude SARS-CoV-2 infection, do not rule out co-infections with other pathogens, and should not be used as the sole basis for treatment or other patient management decisions. Negative results must be combined with clinical observations, patient history, and epidemiological information. The expected result is Negative.  Fact Sheet for Patients: SugarRoll.be  Fact Sheet for Healthcare Providers: https://www.woods-mathews.com/  This test is not yet approved or cleared by the [place] FDA and  has been authorized for detection and/or diagnosis of SARS-CoV-2 by FDA under an Emergency Use Authorization (EUA). This EUA will remain  in effect (meaning this test can be used) for the duration of the COVID-19 declaration under Se ction 564(b)(1) of the Act, 21 U.S.C. section 360bbb-3(b)(1), unless the authorization is terminated or revoked sooner.  Performed at [facility], [place] [address], [place], [place] [address]      Labs: BNP (last 3 results) Recent Labs    06/18/20 0006  BNP 70.3   Basic Metabolic Panel: Recent Labs  Lab 09/22/20 0346 09/23/20 0511 09/24/20 0356 09/25/20 0358  NA 134* 135 133* 133*  K 3.6 3.5 4.0 4.4  CL 97* 101 100 98  CO2 $Re'29 31 30 28  'gpM$ GLUCOSE 141* 169* 225* 178*  BUN $Re'11 9 8 'flX$ 5*  CREATININE 0.49 0.42* 0.45 0.39*  CALCIUM 7.7* 7.9* 7.9* 8.1*  MG 1.8 2.1 1.9 1.9  PHOS 3.6 2.5 2.2* 2.7   Liver Function Tests: Recent Labs  Lab 09/22/20 0346 09/24/20 0356  AST 24 17  ALT 45* 29  ALKPHOS 150* 130*  BILITOT 0.4 <0.1*  PROT 4.8* 4.6*  ALBUMIN 2.0* 1.9*   No results for input(s): LIPASE, AMYLASE in the last 168 hours. No results for input(s): AMMONIA in the last 168  hours. CBC: Recent Labs  Lab 09/24/20 0356 09/25/20 0358 09/26/20 0318 09/27/20 0020 09/28/20 0224  WBC 9.3 13.2* 16.1* 15.6* 15.5*  NEUTROABS 6.2 11.6* 14.2* 13.3* 11.2*  HGB 8.3* 8.4* 8.4* 8.5* 9.0*  HCT 28.3* 28.9* 28.9* 28.5* 30.1*  MCV 98.6 99.3 98.3 96.9 96.8  PLT 237 245 242 245 235   Cardiac Enzymes: No results for input(s): CKTOTAL, CKMB, CKMBINDEX, TROPONINI in the last 168 hours. BNP: Invalid input(s): POCBNP CBG: Recent Labs  Lab 09/27/20 1630 09/27/20 2019 09/27/20 2339 09/28/20 0334 09/28/20 0745  GLUCAP 158* 137* 135* 206* 256*   D-Dimer No results for input(s): DDIMER in the last 72 hours. Hgb A1c No results for input(s): HGBA1C in the last 72 hours. Lipid Profile No results for input(s): CHOL, HDL, LDLCALC, TRIG, CHOLHDL, LDLDIRECT in the last 72 hours. Thyroid function studies No results for input(s): TSH, T4TOTAL, T3FREE, THYROIDAB in the last 72 hours.  Invalid input(s): FREET3 Anemia work up No results for input(s): VITAMINB12, FOLATE, FERRITIN, TIBC, IRON, RETICCTPCT in the last 72 hours. Urinalysis    Component Value Date/Time   COLORURINE YELLOW (A) 09/21/2020 0005   APPEARANCEUR CLOUDY (A) 09/21/2020 0005   APPEARANCEUR Clear 02/10/2018 1639   LABSPEC 1.017 09/21/2020 0005   PHURINE 9.0 (H) 09/21/2020 0005   GLUCOSEU NEGATIVE 09/21/2020 0005   HGBUR SMALL (A) 09/21/2020 0005   BILIRUBINUR NEGATIVE 09/21/2020 0005   BILIRUBINUR Negative 02/10/2018 1639   KETONESUR NEGATIVE 09/21/2020 0005   PROTEINUR 30 (A) 09/21/2020 0005   NITRITE NEGATIVE 09/21/2020 0005   LEUKOCYTESUR LARGE (A) 09/21/2020 0005   Sepsis Labs Invalid input(s): PROCALCITONIN,  WBC,  LACTICIDVEN Microbiology Recent Results (from the past 240 hour(s))  SARS CORONAVIRUS 2 (TAT 6-24 HRS) Nasopharyngeal Nasopharyngeal Swab     Status: None   Collection Time: 09/27/20  1:10 PM   Specimen: Nasopharyngeal Swab  Result Value Ref Range Status   SARS Coronavirus 2  NEGATIVE NEGATIVE Final    Comment: (NOTE) SARS-CoV-2 target nucleic acids are NOT DETECTED.  The SARS-CoV-2 RNA is generally detectable in upper and lower respiratory specimens during the acute phase of infection. Negative results do not preclude SARS-CoV-2 infection, do not rule out co-infections with other pathogens, and should not be used as the sole basis for treatment or other patient management decisions. Negative results must be combined with clinical observations, patient history, and epidemiological information. The expected result is Negative.  Fact Sheet for Patients: SugarRoll.be  Fact Sheet for Healthcare Providers: https://www.woods-mathews.com/  This test is not yet approved or cleared by the [place] FDA and  has been authorized for detection and/or diagnosis of SARS-CoV-2 by FDA under an Emergency Use Authorization (EUA). This EUA will remain  in effect (meaning this test can be used) for the duration of the COVID-19 declaration under Se ction 564(b)(1) of the Act, 21 U.S.C. section 360bbb-3(b)(1), unless the authorization is terminated or revoked sooner.  Performed at [facility], [place] [address], [place], [place] [address]      Time coordinating discharge in minutes: 65  SIGNED:   Debbe Odea, MD  [facility]  09/28/2020, 9:44 AM

## 2020-09-29 NOTE — Plan of Care (Signed)
  Problem: Education: Goal: Knowledge of General Education information will improve Description: Including pain rating scale, medication(s)/side effects and non-pharmacologic comfort measures Outcome: Adequate for Discharge   Problem: Health Behavior/Discharge Planning: Goal: Ability to manage health-related needs will improve Outcome: Adequate for Discharge   Problem: Clinical Measurements: Goal: Ability to maintain clinical measurements within normal limits will improve Outcome: Adequate for Discharge Goal: Will remain free from infection Outcome: Adequate for Discharge Goal: Diagnostic test results will improve Outcome: Adequate for Discharge Goal: Cardiovascular complication will be avoided Outcome: Adequate for Discharge   Problem: Activity: Goal: Risk for activity intolerance will decrease Outcome: Adequate for Discharge   Problem: Nutrition: Goal: Adequate nutrition will be maintained Outcome: Adequate for Discharge   Problem: Coping: Goal: Level of anxiety will decrease Outcome: Adequate for Discharge   Problem: Elimination: Goal: Will not experience complications related to urinary retention Outcome: Adequate for Discharge   Problem: Pain Managment: Goal: General experience of comfort will improve Outcome: Adequate for Discharge   Problem: Safety: Goal: Ability to remain free from injury will improve Outcome: Adequate for Discharge   Problem: Skin Integrity: Goal: Risk for impaired skin integrity will decrease Outcome: Adequate for Discharge   Problem: Education: Goal: Ability to identify signs and symptoms of gastrointestinal bleeding will improve Outcome: Adequate for Discharge   Problem: Bowel/Gastric: Goal: Will show no signs and symptoms of gastrointestinal bleeding Outcome: Adequate for Discharge   Problem: Fluid Volume: Goal: Will show no signs and symptoms of excessive bleeding Outcome: Adequate for Discharge   Problem: Clinical  Measurements: Goal: Complications related to the disease process, condition or treatment will be avoided or minimized Outcome: Adequate for Discharge

## 2020-09-29 NOTE — Progress Notes (Signed)
Pt is awake in bed; AOX4.Vitals stable. Sats stable on room air. Denies pain. No distress noted. Denies nausea. Shift assessment complete. Immediate needs addressed. Bed in lowest position with call light within reach and 3 side rails up. Will continue to assess. Bed alarm on. Awaiting ambulance to transport to facility, pt is discharged.

## 2020-09-29 NOTE — Progress Notes (Signed)
Ambulance arrived to pick up pt. Pt medicated with night time PO medications. Hygiene addressed. Discharge paperwork provided. Pt assisted to stretcher. Pt tolerated well. Immediate needs addressed. IV removed, no complications. Abdominal dressing is clean, dry, and intact. Education provided. Pt discharged. Transferring care.

## 2020-11-21 ENCOUNTER — Inpatient Hospital Stay (HOSPITAL_COMMUNITY)
Admission: EM | Admit: 2020-11-21 | Discharge: 2020-11-26 | DRG: 378 | Disposition: A | Discharge status: Home Health | Payer: 59 | Attending: Internal Medicine | Admitting: Internal Medicine

## 2020-11-21 DIAGNOSIS — C479 Malignant neoplasm of peripheral nerves and autonomic nervous system, unspecified: Secondary | ICD-10-CM | POA: Diagnosis present

## 2020-11-21 DIAGNOSIS — Z993 Dependence on wheelchair: Secondary | ICD-10-CM

## 2020-11-21 DIAGNOSIS — L03116 Cellulitis of left lower limb: Secondary | ICD-10-CM | POA: Diagnosis present

## 2020-11-21 DIAGNOSIS — E119 Type 2 diabetes mellitus without complications: Secondary | ICD-10-CM | POA: Diagnosis present

## 2020-11-21 DIAGNOSIS — Z833 Family history of diabetes mellitus: Secondary | ICD-10-CM

## 2020-11-21 DIAGNOSIS — K5731 Diverticulosis of large intestine without perforation or abscess with bleeding: Secondary | ICD-10-CM | POA: Diagnosis not present

## 2020-11-21 DIAGNOSIS — E876 Hypokalemia: Secondary | ICD-10-CM | POA: Diagnosis present

## 2020-11-21 DIAGNOSIS — D62 Acute posthemorrhagic anemia: Secondary | ICD-10-CM | POA: Diagnosis present

## 2020-11-21 DIAGNOSIS — Z7901 Long term (current) use of anticoagulants: Secondary | ICD-10-CM

## 2020-11-21 DIAGNOSIS — Z933 Colostomy status: Secondary | ICD-10-CM

## 2020-11-21 DIAGNOSIS — Z90710 Acquired absence of both cervix and uterus: Secondary | ICD-10-CM

## 2020-11-21 DIAGNOSIS — Z7401 Bed confinement status: Secondary | ICD-10-CM

## 2020-11-21 DIAGNOSIS — Z888 Allergy status to other drugs, medicaments and biological substances status: Secondary | ICD-10-CM

## 2020-11-21 DIAGNOSIS — Z6836 Body mass index (BMI) 36.0-36.9, adult: Secondary | ICD-10-CM

## 2020-11-21 DIAGNOSIS — K219 Gastro-esophageal reflux disease without esophagitis: Secondary | ICD-10-CM | POA: Diagnosis present

## 2020-11-21 DIAGNOSIS — K922 Gastrointestinal hemorrhage, unspecified: Secondary | ICD-10-CM | POA: Diagnosis present

## 2020-11-21 DIAGNOSIS — G4733 Obstructive sleep apnea (adult) (pediatric): Secondary | ICD-10-CM | POA: Diagnosis present

## 2020-11-21 DIAGNOSIS — Z7952 Long term (current) use of systemic steroids: Secondary | ICD-10-CM

## 2020-11-21 DIAGNOSIS — R7401 Elevation of levels of liver transaminase levels: Secondary | ICD-10-CM

## 2020-11-21 DIAGNOSIS — Z79899 Other long term (current) drug therapy: Secondary | ICD-10-CM

## 2020-11-21 DIAGNOSIS — E249 Cushing's syndrome, unspecified: Secondary | ICD-10-CM | POA: Diagnosis present

## 2020-11-21 DIAGNOSIS — M79672 Pain in left foot: Secondary | ICD-10-CM

## 2020-11-21 DIAGNOSIS — K5900 Constipation, unspecified: Secondary | ICD-10-CM | POA: Diagnosis present

## 2020-11-21 DIAGNOSIS — R918 Other nonspecific abnormal finding of lung field: Secondary | ICD-10-CM | POA: Diagnosis present

## 2020-11-21 DIAGNOSIS — Z20822 Contact with and (suspected) exposure to covid-19: Secondary | ICD-10-CM | POA: Diagnosis present

## 2020-11-21 DIAGNOSIS — D696 Thrombocytopenia, unspecified: Secondary | ICD-10-CM | POA: Diagnosis present

## 2020-11-21 DIAGNOSIS — K5791 Diverticulosis of intestine, part unspecified, without perforation or abscess with bleeding: Secondary | ICD-10-CM

## 2020-11-21 DIAGNOSIS — Z9049 Acquired absence of other specified parts of digestive tract: Secondary | ICD-10-CM

## 2020-11-21 DIAGNOSIS — M5136 Other intervertebral disc degeneration, lumbar region: Secondary | ICD-10-CM | POA: Diagnosis present

## 2020-11-21 DIAGNOSIS — Z886 Allergy status to analgesic agent status: Secondary | ICD-10-CM

## 2020-11-21 DIAGNOSIS — E669 Obesity, unspecified: Secondary | ICD-10-CM | POA: Diagnosis present

## 2020-11-21 DIAGNOSIS — M1990 Unspecified osteoarthritis, unspecified site: Secondary | ICD-10-CM | POA: Diagnosis present

## 2020-11-21 DIAGNOSIS — K921 Melena: Secondary | ICD-10-CM

## 2020-11-21 DIAGNOSIS — Z8249 Family history of ischemic heart disease and other diseases of the circulatory system: Secondary | ICD-10-CM

## 2020-11-21 DIAGNOSIS — R7989 Other specified abnormal findings of blood chemistry: Secondary | ICD-10-CM

## 2020-11-21 DIAGNOSIS — I10 Essential (primary) hypertension: Secondary | ICD-10-CM | POA: Diagnosis present

## 2020-11-21 DIAGNOSIS — Z86711 Personal history of pulmonary embolism: Secondary | ICD-10-CM

## 2020-11-21 DIAGNOSIS — Z794 Long term (current) use of insulin: Secondary | ICD-10-CM

## 2020-11-21 DIAGNOSIS — Z79890 Hormone replacement therapy (postmenopausal): Secondary | ICD-10-CM

## 2020-11-21 MED ORDER — PANTOPRAZOLE 80MG IVPB - SIMPLE MED
80.0000 mg | Freq: Once | INTRAVENOUS | Status: AC
  Administered 2020-11-21: 80 mg via INTRAVENOUS
  Filled 2020-11-21: qty 80

## 2020-11-21 MED ORDER — PANTOPRAZOLE INFUSION (NEW) - SIMPLE MED
8.0000 mg/h | INTRAVENOUS | Status: DC
  Administered 2020-11-21: 8 mg/h via INTRAVENOUS
  Filled 2020-11-21: qty 80
  Filled 2020-11-21: qty 100

## 2020-11-21 MED ORDER — SODIUM CHLORIDE 0.9 % IV SOLN: INTRAVENOUS | Status: DC

## 2020-11-21 NOTE — ED Triage Notes (Signed)
Pt presents to ED BIB [facility] from home. Pt c/o dark stool and blood in stool in ostomy. Pt on blood thinners.   132/82 HR - 78 RR - 18 CBG - 378

## 2020-11-21 NOTE — ED Provider Notes (Signed)
[facility] EMERGENCY DEPARTMENT Provider Note   CSN: [id number] Arrival date & time: 11/21/20  2149     History Chief Complaint  Patient presents with   GI Problem    Rhonda [name] is a 60 y.o. 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  The patient reports melanotic stool in her ostomy bag, onset today.  No bright red blood noted.  The patient's husband reports that she did see a small amount of blood in her her ostomy bag several days ago, but this resolved until earlier today.  No known aggravating or alleviating factors.  She has been taking senna for constipation for the last few days.  Family reports that her entire ostomy bag was full of melanotic stool, but her daughter change the bag just prior to arrival.  She reports associated, constant, gradually worsening shortness of breath over the last few days.  No abdominal pain, rash, falls, syncope, lightheadedness, numbness, weakness, visual changes, chest pain, abdominal pain, nausea, hematuria, rashes.  The patient reports that she did have intermittent room spinning dizziness while she was at PT earlier today, but this is since resolved.  She has a history of similar.  No change from previous.  No dizziness at this time.  The patient also has a wound noted to her left foot from an injury several weeks ago that has not healed.  Per chart review: patient was admitted from 4/13-5/13 after she presented to the ED with frank rectal bleeding and hypotension.  CTA revealed acute bleeding in the descending colon and coil embolization of distal arcade branch of the superior sigmoid artery was performed.  On April 16, the patient developed severe abdominal pain and CT revealed visceral perforation  and patient underwent an urgent exploratory laparotomy and was found to have a necrotic sigmoid colon and underwent Hartmann procedure with colostomy and wound VAC to midline abdominal incision.  The history is provided by the patient, medical records, the EMS personnel and the spouse. No language interpreter was used.      Past Medical History:  Diagnosis Date   Arthritis    Degenerative disc disease, lumbar    GERD (gastroesophageal reflux disease)    Hypertension    OSA (obstructive sleep apnea)    not currently on CPAP   Prediabetes    Sarcoidosis of central nervous system    Seizures (HCC)    Type 2    Urinary incontinence     Patient Active Problem List   Diagnosis Date Noted   Bowel perforation (HCC) 09/26/2020   Hypotension    Rectal bleeding    AKI (acute kidney injury) (HCC)    GI bleed 08/29/2020   Neurosarcoidosis 08/06/2020   High risk medication use 08/06/2020   Weakness 08/06/2020   Type 2 diabetes mellitus without complication, with long-term current use of insulin (HCC) 08/06/2020   Acute lower GI bleeding 07/23/2020   Hyperosmolar hyperglycemic state (HHS) (HCC) 07/23/2020   Diverticulitis of large intestine with bleeding 07/23/2020   Liver lesion 07/23/2020   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   HLD (hyperlipidemia) 01/12/2020   Acute lower UTI 01/12/2020   AMS (altered mental status) 01/11/2020   Memory changes 01/08/2020   Altered mental status    Hyponatremia    Speech disturbance  Demyelinating changes in brain [facility]) 11/13/2019   Left pontine stroke ([place]) 05/23/2019   Transaminitis 05/23/2019   Nausea and vomiting 05/23/2019   Leukocytosis 05/23/2019   Thoracic lymphadenopathy 08/26/2018   Axillary lymphadenopathy 08/26/2018   Piriformis syndrome of right side 09/29/2016   OSA (obstructive sleep apnea) 09/27/2015   Right hip pain 04/11/2015   Essential hypertension 11/07/2014   Chronic low back pain 11/07/2014   Prediabetes 11/07/2014   Vitamin D  deficiency 11/07/2014    Past Surgical History:  Procedure Laterality Date   ABDOMINAL HYSTERECTOMY     BOWEL RESECTION N/A 09/01/2020   Procedure: SMALL BOWEL RESECTION;  Surgeon: Georganna Skeans, MD;  Location: [place];  Service: General;  Laterality: N/A;   BREAST EXCISIONAL BIOPSY Right    BREAST SURGERY     right breast   CESAREAN SECTION     COLONOSCOPY WITH PROPOFOL N/A 07/26/2020   Procedure: COLONOSCOPY WITH PROPOFOL;  Surgeon: Carol Ada, MD;  Location: [place];  Service: Endoscopy;  Laterality: N/A;   ESOPHAGOGASTRODUODENOSCOPY (EGD) WITH PROPOFOL N/A 07/24/2020   Procedure: ESOPHAGOGASTRODUODENOSCOPY (EGD) WITH PROPOFOL;  Surgeon: Carol Ada, MD;  Location: [place];  Service: Endoscopy;  Laterality: N/A;  Bleeding with anemia-5 gms drop in hemoglobin   GIVENS CAPSULE STUDY N/A 08/21/2020   Procedure: GIVENS CAPSULE STUDY;  Surgeon: Juanita Craver, MD;  Location[facility] END[facility]COPY;  Service: Endoscopy;  Laterality: N/A;   IR ANGIOGRAM FOLLOW UP STUDY  08/29/2020   IR ANGIOGRAM SELECTIVE EACH ADDITIONAL VESSEL  08/29/2020   IR ANGIOGRAM VISCERAL SELECTIVE  08/29/2020   IR ANGIOGRAM VISCERAL SELECTIVE  08/29/2020   IR EMBO ART  VEN HEMORR LYMPH EXTRAV  INC GUIDE ROADMAPPING  08/29/2020   IR US GUIDE VASC ACCESS RIGHT  08/29/2020   LAPAROTOMY N/A 09/01/2020   Procedure: EXPLORATORY LAPAROTOMY;  Surgeon: Georganna Skeans, MD;  Location: [place];  Service: General;  Laterality: N/A;     OB History     Gravida  5   Para      Term      Preterm      AB  1   Living  4      SAB  1   IAB      Ectopic      Multiple  1   Live Births  4           Family History  Problem Relation Age of Onset   Cancer Mother    Hypertension Sister    Diabetes Sister    Diabetes Maternal Grandmother     Social History   Tobacco Use   Smoking status: Never   Smokeless tobacco: Never  Vaping Use   Vaping Use: Never used  Substance Use Topics   Alcohol use: No   Drug use: No     Home Medications Prior to Admission medications   Medication Sig Start Date End Date Taking? Authorizing Provider  apixaban (ELIQUIS) 5 MG TABS tablet Take 1 tablet (5 mg total) by mouth 2 (two) times daily. 10/04/20  Yes Debbe Odea, MD  atorvastatin (LIPITOR) 80 MG tablet Take 1 tablet (80 mg total) by mouth daily at 6 PM. Patient taking differently: Take 80 mg by mouth at bedtime. 05/24/19 11/22/20 Yes Dessa Phi, DO  b complex vitamins tablet Take 1 tablet by mouth daily.   Yes [provider]  blood glucose meter kit and supplies KIT Dispense based on patient and insurance preference. Use up to four times daily as directed.  07/27/20  Yes Elgergawy, Silver Huguenin, MD  Calcium Carb-Cholecalciferol (CALCIUM+D3 PO) Take 1 tablet by mouth daily with lunch.   Yes [provider]  FEROSUL 325 (65 Fe) MG tablet Take 325 mg by mouth 2 (two) times daily. 07/27/20  Yes [provider]  gabapentin (NEURONTIN) 100 MG capsule Take 100 mg by mouth 3 (three) times daily. 08/01/20  Yes [provider]  glucosamine-chondroitin 500-400 MG tablet Take 1 tablet by mouth 3 (three) times daily. Patient taking differently: Take 1 tablet by mouth 2 (two) times daily. 04/11/15  Yes Funches, Josalyn, MD  insulin glargine (LANTUS) 100 UNIT/ML injection Inject 0.14 mLs (14 Units total) into the skin at bedtime. Patient taking differently: Inject 14 Units into the skin daily. 09/28/20  Yes Debbe Odea, MD  Insulin Pen Needle 29G X 5MM MISC USE WITH LANTUS 07/27/20  Yes Elgergawy, Silver Huguenin, MD  melatonin 3 MG TABS tablet Take 1 tablet (3 mg total) by mouth at bedtime. Patient taking differently: Take 9 mg by mouth at bedtime. 01/17/20  Yes Regalado, Belkys A, MD  metoprolol tartrate (LOPRESSOR) 25 MG tablet Take 25 mg by mouth 2 (two) times daily. 11/08/20  Yes [provider]  Multiple Vitamin (MULTIVITAMIN WITH MINERALS) TABS tablet Take 1 tablet by mouth daily with lunch.    Yes  [provider]  nitrofurantoin, macrocrystal-monohydrate, (MACROBID) 100 MG capsule Take 100 mg by mouth See admin instructions. Bid x 5 days 11/16/20  Yes [provider]  Omega-3 Fatty Acids (FISH OIL) 1000 MG CAPS Take 1,000 mg by mouth daily with lunch.    Yes [provider]  ondansetron (ZOFRAN ODT) 4 MG disintegrating tablet Take 1 tablet (4 mg total) by mouth every 8 (eight) hours as needed for nausea or vomiting. 09/28/20  Yes Debbe Odea, MD  pantoprazole (PROTONIX) 40 MG tablet Take 1 tablet (40 mg total) by mouth daily. 01/17/20  Yes Regalado, Belkys A, MD  polyethylene glycol (MIRALAX / GLYCOLAX) 17 g packet Take 17 g by mouth 2 (two) times daily. Patient taking differently: Take 17 g by mouth daily as needed for mild constipation. 01/17/20  Yes Regalado, Belkys A, MD  predniSONE (DELTASONE) 20 MG tablet Take 40 mg by mouth See admin instructions. 8am and 2pm 07/17/20  Yes [provider]  REFRESH 1.4-0.6 % SOLN Place 1 drop into both eyes daily as needed (dry eyes). 10/18/20  Yes [provider]  apixaban (ELIQUIS) 5 MG TABS tablet Take 2 tablets (10 mg total) by mouth 2 (two) times daily for 6 days. Patient not taking: Reported on 11/22/2020 09/28/20 10/04/20  Debbe Odea, MD  docusate sodium (COLACE) 100 MG capsule Take 1 capsule (100 mg total) by mouth 2 (two) times daily. Patient not taking: No sig reported 09/28/20   Debbe Odea, MD  Ensure Max Protein (ENSURE MAX PROTEIN) LIQD Take 330 mLs (11 oz total) by mouth at bedtime. Patient not taking: No sig reported 09/28/20   Debbe Odea, MD  feeding supplement, GLUCERNA SHAKE, (GLUCERNA SHAKE) LIQD Take 237 mLs by mouth 2 (two) times daily between meals. Patient not taking: No sig reported 09/28/20   Debbe Odea, MD  insulin aspart (NOVOLOG) 100 UNIT/ML FlexPen Inject 1-20 Units into the skin 3 (three) times daily with meals. CBG 70 - 120: 0 units  CBG 121 - 150: 3 units  CBG 151 - 200: 4  units  CBG 201 - 250: 7 units  CBG 251 - 300: 11 units  CBG 301 - 350: 15 units  CBG 351 - 400: 20 units Patient not taking: No sig reported 09/28/20   Debbe Odea, MD  oxyCODONE (OXY IR/ROXICODONE) 5 MG immediate release tablet Take 1 tablet (5 mg total) by mouth every 4 (four) hours as needed for severe pain. Patient not taking: No sig reported 09/28/20   Debbe Odea, MD  PRESCRIPTION MEDICATION Inhale into the lungs See admin instructions. CPAP- At bedtime Patient not taking: Reported on 11/22/2020    [provider]    Allergies    Aspirin and Acetaminophen  Review of Systems   Review of Systems  Constitutional:  Negative for activity change.  HENT:  Negative for congestion and sore throat.   Respiratory:  Positive for shortness of breath. Negative for cough and wheezing.   Cardiovascular:  Negative for chest pain and palpitations.  Gastrointestinal:  Positive for blood in stool and nausea. Negative for abdominal pain, constipation, diarrhea, rectal pain and vomiting.  Genitourinary:  Negative for dysuria, pelvic pain and vaginal bleeding.  Musculoskeletal:  Negative for back pain, myalgias, neck pain and neck stiffness.  Skin:  Positive for wound. Negative for rash.  Allergic/Immunologic: Negative for immunocompromised state.  Neurological:  Negative for seizures, syncope, weakness and headaches.  Psychiatric/Behavioral:  Negative for confusion.    Physical Exam Updated Vital Signs BP (!) 185/85   Pulse (!) 108   Temp 98 F (36.7 C)   Resp 18   Ht $R'5\' 3"'iy$  (1.6 m)   Wt 92.2 kg   SpO2 97%   BMI 36.01 kg/m   Physical Exam Vitals and nursing note reviewed.  Constitutional:      General: She is not in acute distress. HENT:     Head: Normocephalic.  Eyes:     Conjunctiva/sclera: Conjunctivae normal.  Cardiovascular:     Rate and Rhythm: Normal rate and regular rhythm.     Heart sounds: No murmur heard.   No friction rub. No gallop.  Pulmonary:     Effort:  Pulmonary effort is normal. No respiratory distress.     Breath sounds: No stridor. No wheezing, rhonchi or rales.  Abdominal:     General: There is no distension.     Palpations: Abdomen is soft. There is no mass.     Tenderness: There is no abdominal tenderness. There is no right CVA tenderness, left CVA tenderness, guarding or rebound.     Hernia: No hernia is present.     Comments: Abdomen is soft, nontender, nondistended.  Ostomy in place to in the left lower quadrant.  Small amount of melanotic stool noted in ostomy bag.   Musculoskeletal:     Cervical back: Neck supple.     Comments: Bilateral lower extremities are edematous.  Family reports that this is much improved from previous.  There is an ecchymotic wound noted to the left foot.  Neurovascular intact to the bilateral lower extremities.  Skin:    General: Skin is warm.     Findings: No rash.  Neurological:     Mental Status: She is alert.  Psychiatric:        Behavior: Behavior normal.    ED Results / Procedures / Treatments   Labs (all labs ordered are listed, but only abnormal results are displayed) Labs Reviewed  COMPREHENSIVE METABOLIC PANEL - Abnormal; Notable for the following components:      Result Value   Sodium 131 (*)    Chloride 93 (*)    Glucose, Bld 286 (*)  Total Protein 4.4 (*)    Albumin 2.2 (*)    AST 472 (*)    ALT 205 (*)    Alkaline Phosphatase 790 (*)    Total Bilirubin 3.1 (*)    All other components within normal limits  CBC WITH DIFFERENTIAL/PLATELET - Abnormal; Notable for the following components:   RBC 3.09 (*)    Hemoglobin 8.8 (*)    HCT 28.6 (*)    RDW 16.7 (*)    Platelets 127 (*)    nRBC 1.6 (*)    Lymphs Abs 0.5 (*)    Abs Immature Granulocytes 0.19 (*)    All other components within normal limits  TROPONIN I (HIGH SENSITIVITY) - Abnormal; Notable for the following components:   Troponin I (High Sensitivity) 101 (*)    All other components within normal limits  RESP  PANEL BY RT-PCR (FLU A&B, COVID) ARPGX2  PROTIME-INR  APTT  LIPASE, BLOOD  HEPATITIS PANEL, ACUTE  HIV ANTIBODY (ROUTINE TESTING W REFLEX)  TYPE AND SCREEN  TROPONIN I (HIGH SENSITIVITY)    EKG EKG Interpretation  Date/Time:  Wednesday November 21 2020 22:46:23 EDT Ventricular Rate:  79 PR Interval:  119 QRS Duration: 94 QT Interval:  390 QTC Calculation: 448 R Axis:   -2 Text Interpretation: Sinus rhythm Borderline short PR interval Abnormal R-wave progression, early transition Borderline ST depression, inferior leads No significant change was found Confirmed by Ezequiel Essex [id number]) on 11/21/2020 11:42:37 PM  Radiology CT Angio Abd/Pel W and/or Wo Contrast  Result Date: 11/22/2020 CLINICAL DATA:  GI bleed, on blood thinners EXAM: CTA ABDOMEN AND PELVIS WITHOUT AND WITH CONTRAST TECHNIQUE: Multidetector CT imaging of the abdomen and pelvis was performed using the standard protocol during bolus administration of intravenous contrast. Multiplanar reconstructed images and MIPs were obtained and reviewed to evaluate the vascular anatomy. CONTRAST:  127mL OMNIPAQUE IOHEXOL 350 MG/ML SOLN COMPARISON:  CT abdomen/pelvis dated 09/26/2020 FINDINGS: VASCULAR Aorta: Patent. No evidence of abdominal aortic aneurysm. Mild atherosclerotic calcifications. Celiac: Patent. SMA: Patent. Renals: Patent bilaterally. IMA: Patent. Inflow: Patent. Proximal Outflow: Patent. Veins: Grossly unremarkable. Review of the MIP images confirms the above findings. NON-VASCULAR Lower chest: Lung bases are clear. Hepatobiliary: Liver is within normal limits. Gallbladder is unremarkable. No intrahepatic or extrahepatic ductal dilatation. Pancreas: Within normal limits. Spleen: Within normal limits. Adrenals/Urinary Tract: Adrenal glands are within normal limits. Subcentimeter left lower pole renal cyst (series 7/image 71). Right kidney is within normal limits. No hydronephrosis. Bladder is within normal limits. Stomach/Bowel:  Stomach is within normal limits. No evidence of bowel obstruction. Appendix is not discretely visualized. Status post left hemicolectomy with left lower quadrant colostomy. Scattered colonic diverticulosis. On the arterial phase, there is possible early enhancement within a diverticulum just proximal to the left mid abdominal colostomy as it exits the rectus sheath (series 10/image 111), equivocal. However, on venous phase, there is clear intraluminal extravasation of contrast in this location (series 17/image 109), confirming active GI bleeding in this location. Lymphatic: No suspicious abdominopelvic lymphadenopathy. Reproductive: Status post hysterectomy. Bilateral ovaries are within normal limits. Other: No abdominopelvic ascites. Musculoskeletal: Benign intramuscular lipoma in the left gluteal musculature (series 10/image 153). Degenerative changes of the visualized thoracolumbar spine. IMPRESSION: VASCULAR Intraluminal extravasation of contrast just proximal to the left mid abdominal colostomy as it exits the rectus sheath, reflecting active GI bleeding, possibly diverticular in etiology. Direct endoscopic visualization via the colostomy is suggested. NON-VASCULAR Status post left hemicolectomy with left lower quadrant colostomy. Additional ancillary findings as  above. Electronically Signed   By: Julian Hy M.D.   On: 11/22/2020 01:39    Procedures .Critical Care  Date/Time: 11/22/2020 2:40 AM Performed by: Joanne Gavel, PA-C Authorized by: Joanne Gavel, PA-C   Critical care provider statement:    Critical care time (minutes):  55   Critical care time was exclusive of:  Separately billable procedures and treating other patients and teaching time   Critical care was necessary to treat or prevent imminent or life-threatening deterioration of the following conditions: GI bleed.   Critical care was time spent personally by me on the following activities:  Development of treatment plan with  patient or surrogate, discussions with consultants, evaluation of patient's response to treatment, examination of patient, obtaining history from patient or surrogate, ordering and performing treatments and interventions, ordering and review of laboratory studies, ordering and review of radiographic studies, pulse oximetry, re-evaluation of patient's condition and review of old charts   I assumed direction of critical care for this patient from another provider in my specialty: no     Care discussed with: admitting provider     Medications Ordered in ED Medications  0.9 %  sodium chloride infusion ( Intravenous New Bag/Given 11/21/20 2343)  pantoprozole (PROTONIX) 80 mg /NS 100 mL infusion (8 mg/hr Intravenous New Bag/Given 11/21/20 2348)  prothrombin complex conc human (KCENTRA) IVPB 4,392 Units (4,392 Units Intravenous New Bag/Given 11/22/20 0314)  pantoprazole (PROTONIX) 80 mg /NS 100 mL IVPB (0 mg Intravenous Stopped 11/21/20 2347)  iohexol (OMNIPAQUE) 350 MG/ML injection 100 mL (100 mLs Intravenous Contrast Given 11/22/20 0106)    ED Course  I have reviewed the triage vital signs and the nursing notes.  Pertinent labs & imaging results that were available during my care of the patient were reviewed by me and considered in my medical decision making (see chart for details).  Clinical Course as of 11/22/20 0326  Thu Nov 22, 2020  0209 Spoke with Dr. Dwaine Gale, IR, she discussed patient's CTA.  He recommends reversing patient's Eliquis.  Since she is hemodynamically stable, IR will see her at shift change at 7 AM.  If patient begins to decompensate, he requested a call back for emergent intervention. [MM]  0229 Spoke with pharmacy regarding reversing the patient's Eliquis.  Last dose was at 8:30 AM on July 6.  Jeneen Rinks with pharmacy recommends [place].  Orders will be placed by pharmacy. 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  She also reports that she has been more short of breath over the last few days.  No falls, syncope, or lightheadedness.  She did feel dizzy while at PT earlier today, but states that she has a history of similar.  No chest pain.  Vital signs are stable.  The patient was seen and independently evaluated by Dr. Wyvonnia Dusky, attending physician.  Labs and imaging of been reviewed and independently interpreted by me.  Hemoglobin is 8.8.  Grossly melanotic stool is visualized in the patient's ostomy bag.  Will defer Hemoccult at  this time.  Given her history, CTA was obtained with intraluminal extravasation of contrast just proximal to the left mid abdominal colostomy as it exits the rectus sheath reflecting active GI bleeding, possibly diverticular in etiology.  Discussed the patient with Dr. Dwaine Gale, IR.  IR will see the patient in the a.m. since she is stable.  Discussed with pharmacy and will reverse Eliquis with [facility].  He requested a call back if patient's clinical status changes.  Patient was given Protonix bolus and infusion.  Patient also has acutely elevated AST, ALT, alkaline phosphatase, total bilirubin.  Abdomen is soft and nontender.  Gallbladder is unremarkable on CT.  We will add on hepatitis panel.  Troponin is elevated above her baseline.  Uncertain etiology at this time his EKG has no acute changes.  Doubt secondary to CKD.  Will trend troponin.  Consult to the hospitalist service and Dr. Nevada Crane will accept the patient for admission. The patient appears reasonably stabilized for admission considering the current resources, flow, and capabilities available in the ED at this time, and I  doubt any other [facility] requiring further screening and/or treatment in the ED prior to admission.   Final Clinical Impression(s) / ED Diagnoses Final diagnoses:  Gastrointestinal hemorrhage associated with intestinal diverticulosis  Elevated transaminase level    Rx / DC Orders ED Discharge Orders     None        Joanne Gavel, PA-C 11/22/20 0326    Ezequiel Essex, MD 11/22/20 [phone]

## 2020-11-22 ENCOUNTER — Other Ambulatory Visit: Payer: Self-pay

## 2020-11-22 ENCOUNTER — Emergency Department (HOSPITAL_COMMUNITY): Payer: 59

## 2020-11-22 DIAGNOSIS — Z7901 Long term (current) use of anticoagulants: Secondary | ICD-10-CM | POA: Diagnosis not present

## 2020-11-22 DIAGNOSIS — K219 Gastro-esophageal reflux disease without esophagitis: Secondary | ICD-10-CM | POA: Diagnosis present

## 2020-11-22 DIAGNOSIS — K921 Melena: Secondary | ICD-10-CM | POA: Diagnosis not present

## 2020-11-22 DIAGNOSIS — E669 Obesity, unspecified: Secondary | ICD-10-CM | POA: Diagnosis present

## 2020-11-22 DIAGNOSIS — K5791 Diverticulosis of intestine, part unspecified, without perforation or abscess with bleeding: Secondary | ICD-10-CM

## 2020-11-22 DIAGNOSIS — R7989 Other specified abnormal findings of blood chemistry: Secondary | ICD-10-CM | POA: Diagnosis not present

## 2020-11-22 DIAGNOSIS — Z20822 Contact with and (suspected) exposure to covid-19: Secondary | ICD-10-CM | POA: Diagnosis present

## 2020-11-22 DIAGNOSIS — D62 Acute posthemorrhagic anemia: Secondary | ICD-10-CM | POA: Diagnosis present

## 2020-11-22 DIAGNOSIS — C479 Malignant neoplasm of peripheral nerves and autonomic nervous system, unspecified: Secondary | ICD-10-CM | POA: Diagnosis present

## 2020-11-22 DIAGNOSIS — K5730 Diverticulosis of large intestine without perforation or abscess without bleeding: Secondary | ICD-10-CM | POA: Diagnosis not present

## 2020-11-22 DIAGNOSIS — K5731 Diverticulosis of large intestine without perforation or abscess with bleeding: Principal | ICD-10-CM

## 2020-11-22 DIAGNOSIS — M5136 Other intervertebral disc degeneration, lumbar region: Secondary | ICD-10-CM | POA: Diagnosis present

## 2020-11-22 DIAGNOSIS — Z933 Colostomy status: Secondary | ICD-10-CM | POA: Diagnosis not present

## 2020-11-22 DIAGNOSIS — E249 Cushing's syndrome, unspecified: Secondary | ICD-10-CM | POA: Diagnosis present

## 2020-11-22 DIAGNOSIS — Z7952 Long term (current) use of systemic steroids: Secondary | ICD-10-CM | POA: Diagnosis not present

## 2020-11-22 DIAGNOSIS — R935 Abnormal findings on diagnostic imaging of other abdominal regions, including retroperitoneum: Secondary | ICD-10-CM

## 2020-11-22 DIAGNOSIS — E876 Hypokalemia: Secondary | ICD-10-CM | POA: Diagnosis present

## 2020-11-22 DIAGNOSIS — R609 Edema, unspecified: Secondary | ICD-10-CM | POA: Diagnosis not present

## 2020-11-22 DIAGNOSIS — M7989 Other specified soft tissue disorders: Secondary | ICD-10-CM | POA: Diagnosis not present

## 2020-11-22 DIAGNOSIS — I10 Essential (primary) hypertension: Secondary | ICD-10-CM | POA: Diagnosis present

## 2020-11-22 DIAGNOSIS — M1990 Unspecified osteoarthritis, unspecified site: Secondary | ICD-10-CM | POA: Diagnosis present

## 2020-11-22 DIAGNOSIS — Z79890 Hormone replacement therapy (postmenopausal): Secondary | ICD-10-CM | POA: Diagnosis not present

## 2020-11-22 DIAGNOSIS — E119 Type 2 diabetes mellitus without complications: Secondary | ICD-10-CM | POA: Diagnosis present

## 2020-11-22 DIAGNOSIS — Z993 Dependence on wheelchair: Secondary | ICD-10-CM | POA: Diagnosis not present

## 2020-11-22 DIAGNOSIS — G4733 Obstructive sleep apnea (adult) (pediatric): Secondary | ICD-10-CM | POA: Diagnosis present

## 2020-11-22 DIAGNOSIS — Z6836 Body mass index (BMI) 36.0-36.9, adult: Secondary | ICD-10-CM | POA: Diagnosis not present

## 2020-11-22 DIAGNOSIS — R918 Other nonspecific abnormal finding of lung field: Secondary | ICD-10-CM | POA: Diagnosis present

## 2020-11-22 DIAGNOSIS — K5900 Constipation, unspecified: Secondary | ICD-10-CM | POA: Diagnosis present

## 2020-11-22 DIAGNOSIS — Z7401 Bed confinement status: Secondary | ICD-10-CM | POA: Diagnosis not present

## 2020-11-22 DIAGNOSIS — D696 Thrombocytopenia, unspecified: Secondary | ICD-10-CM | POA: Diagnosis present

## 2020-11-22 DIAGNOSIS — R7401 Elevation of levels of liver transaminase levels: Secondary | ICD-10-CM | POA: Diagnosis not present

## 2020-11-22 DIAGNOSIS — L03116 Cellulitis of left lower limb: Secondary | ICD-10-CM | POA: Diagnosis present

## 2020-11-22 DIAGNOSIS — K922 Gastrointestinal hemorrhage, unspecified: Secondary | ICD-10-CM | POA: Diagnosis present

## 2020-11-22 LAB — APTT: aPTT: 34 seconds (ref 24–36)

## 2020-11-22 LAB — GLUCOSE, CAPILLARY
Glucose-Capillary: 197 mg/dL — ABNORMAL HIGH (ref 70–99)
Glucose-Capillary: 203 mg/dL — ABNORMAL HIGH (ref 70–99)

## 2020-11-22 LAB — COMPREHENSIVE METABOLIC PANEL
ALT: 198 U/L — ABNORMAL HIGH (ref 0–44)
ALT: 205 U/L — ABNORMAL HIGH (ref 0–44)
AST: 448 U/L — ABNORMAL HIGH (ref 15–41)
AST: 472 U/L — ABNORMAL HIGH (ref 15–41)
Albumin: 2.1 g/dL — ABNORMAL LOW (ref 3.5–5.0)
Albumin: 2.2 g/dL — ABNORMAL LOW (ref 3.5–5.0)
Alkaline Phosphatase: 780 U/L — ABNORMAL HIGH (ref 38–126)
Alkaline Phosphatase: 790 U/L — ABNORMAL HIGH (ref 38–126)
Anion gap: 12 (ref 5–15)
Anion gap: 8 (ref 5–15)
BUN: 5 mg/dL — ABNORMAL LOW (ref 6–20)
BUN: 7 mg/dL (ref 6–20)
CO2: 26 mmol/L (ref 22–32)
CO2: 28 mmol/L (ref 22–32)
Calcium: 8.8 mg/dL — ABNORMAL LOW (ref 8.9–10.3)
Calcium: 8.9 mg/dL (ref 8.9–10.3)
Chloride: 93 mmol/L — ABNORMAL LOW (ref 98–111)
Chloride: 98 mmol/L (ref 98–111)
Creatinine, Ser: 0.66 mg/dL (ref 0.44–1.00)
Creatinine, Ser: 0.78 mg/dL (ref 0.44–1.00)
GFR, Estimated: >60 mL/min (ref >60)
GFR, Estimated: >60 mL/min (ref >60)
Glucose, Bld: 264 mg/dL — ABNORMAL HIGH (ref 70–99)
Glucose, Bld: 286 mg/dL — ABNORMAL HIGH (ref 70–99)
Potassium: 3.4 mmol/L — ABNORMAL LOW (ref 3.5–5.1)
Potassium: 3.5 mmol/L (ref 3.5–5.1)
Sodium: 131 mmol/L — ABNORMAL LOW (ref 135–145)
Sodium: 134 mmol/L — ABNORMAL LOW (ref 135–145)
Total Bilirubin: 3.1 mg/dL — ABNORMAL HIGH (ref 0.3–1.2)
Total Bilirubin: 3.2 mg/dL — ABNORMAL HIGH (ref 0.3–1.2)
Total Protein: 4.4 g/dL — ABNORMAL LOW (ref 6.5–8.1)
Total Protein: 4.6 g/dL — ABNORMAL LOW (ref 6.5–8.1)

## 2020-11-22 LAB — CBC WITH DIFFERENTIAL/PLATELET
Abs Immature Granulocytes: 0.18 10*3/uL — ABNORMAL HIGH (ref 0.00–0.07)
Abs Immature Granulocytes: 0.19 10*3/uL — ABNORMAL HIGH (ref 0.00–0.07)
Abs Immature Granulocytes: 0.19 10*3/uL — ABNORMAL HIGH (ref 0.00–0.07)
Abs Immature Granulocytes: 0.21 10*3/uL — ABNORMAL HIGH (ref 0.00–0.07)
Abs Immature Granulocytes: 0.4 10*3/uL — ABNORMAL HIGH (ref 0.00–0.07)
Basophils Absolute: 0 10*3/uL (ref 0.0–0.1)
Basophils Absolute: 0 10*3/uL (ref 0.0–0.1)
Basophils Absolute: 0 10*3/uL (ref 0.0–0.1)
Basophils Absolute: 0 10*3/uL (ref 0.0–0.1)
Basophils Absolute: 0 10*3/uL (ref 0.0–0.1)
Basophils Relative: 0 %
Basophils Relative: 0 %
Basophils Relative: 0 %
Basophils Relative: 0 %
Basophils Relative: 0 %
Eosinophils Absolute: 0 10*3/uL (ref 0.0–0.5)
Eosinophils Absolute: 0 10*3/uL (ref 0.0–0.5)
Eosinophils Absolute: 0 10*3/uL (ref 0.0–0.5)
Eosinophils Absolute: 0 10*3/uL (ref 0.0–0.5)
Eosinophils Absolute: 0 10*3/uL (ref 0.0–0.5)
Eosinophils Relative: 0 %
Eosinophils Relative: 0 %
Eosinophils Relative: 0 %
Eosinophils Relative: 0 %
Eosinophils Relative: 0 %
HCT: 24.3 % — ABNORMAL LOW (ref 36.0–46.0)
HCT: 25.5 % — ABNORMAL LOW (ref 36.0–46.0)
HCT: 26.3 % — ABNORMAL LOW (ref 36.0–46.0)
HCT: 27.5 % — ABNORMAL LOW (ref 36.0–46.0)
HCT: 28.6 % — ABNORMAL LOW (ref 36.0–46.0)
Hemoglobin: 7.6 g/dL — ABNORMAL LOW (ref 12.0–15.0)
Hemoglobin: 7.9 g/dL — ABNORMAL LOW (ref 12.0–15.0)
Hemoglobin: 8.2 g/dL — ABNORMAL LOW (ref 12.0–15.0)
Hemoglobin: 8.3 g/dL — ABNORMAL LOW (ref 12.0–15.0)
Hemoglobin: 8.8 g/dL — ABNORMAL LOW (ref 12.0–15.0)
Immature Granulocytes: 2 %
Immature Granulocytes: 2 %
Immature Granulocytes: 3 %
Immature Granulocytes: 3 %
Immature Granulocytes: 5 %
Lymphocytes Relative: 3 %
Lymphocytes Relative: 4 %
Lymphocytes Relative: 5 %
Lymphocytes Relative: 5 %
Lymphocytes Relative: 6 %
Lymphs Abs: 0.2 10*3/uL — ABNORMAL LOW (ref 0.7–4.0)
Lymphs Abs: 0.3 10*3/uL — ABNORMAL LOW (ref 0.7–4.0)
Lymphs Abs: 0.4 10*3/uL — ABNORMAL LOW (ref 0.7–4.0)
Lymphs Abs: 0.5 10*3/uL — ABNORMAL LOW (ref 0.7–4.0)
Lymphs Abs: 0.5 10*3/uL — ABNORMAL LOW (ref 0.7–4.0)
MCH: 27.9 pg (ref 26.0–34.0)
MCH: 28.3 pg (ref 26.0–34.0)
MCH: 28.5 pg (ref 26.0–34.0)
MCH: 28.5 pg (ref 26.0–34.0)
MCH: 28.6 pg (ref 26.0–34.0)
MCHC: 30.2 g/dL (ref 30.0–36.0)
MCHC: 30.8 g/dL (ref 30.0–36.0)
MCHC: 31 g/dL (ref 30.0–36.0)
MCHC: 31.2 g/dL (ref 30.0–36.0)
MCHC: 31.3 g/dL (ref 30.0–36.0)
MCV: 90.7 fL (ref 80.0–100.0)
MCV: 91 fL (ref 80.0–100.0)
MCV: 92.3 fL (ref 80.0–100.0)
MCV: 92.4 fL (ref 80.0–100.0)
MCV: 92.6 fL (ref 80.0–100.0)
Monocytes Absolute: 0.2 10*3/uL (ref 0.1–1.0)
Monocytes Absolute: 0.3 10*3/uL (ref 0.1–1.0)
Monocytes Absolute: 0.3 10*3/uL (ref 0.1–1.0)
Monocytes Absolute: 0.3 10*3/uL (ref 0.1–1.0)
Monocytes Absolute: 0.3 10*3/uL (ref 0.1–1.0)
Monocytes Relative: 3 %
Monocytes Relative: 3 %
Monocytes Relative: 4 %
Monocytes Relative: 4 %
Monocytes Relative: 4 %
Neutro Abs: 6.3 10*3/uL (ref 1.7–7.7)
Neutro Abs: 7.3 10*3/uL (ref 1.7–7.7)
Neutro Abs: 7.4 10*3/uL (ref 1.7–7.7)
Neutro Abs: 7.5 10*3/uL (ref 1.7–7.7)
Neutro Abs: 8.8 10*3/uL — ABNORMAL HIGH (ref 1.7–7.7)
Neutrophils Relative %: 86 %
Neutrophils Relative %: 89 %
Neutrophils Relative %: 89 %
Neutrophils Relative %: 89 %
Neutrophils Relative %: 91 %
Platelets: 127 10*3/uL — ABNORMAL LOW (ref 150–400)
Platelets: 132 10*3/uL — ABNORMAL LOW (ref 150–400)
Platelets: 136 10*3/uL — ABNORMAL LOW (ref 150–400)
Platelets: 136 10*3/uL — ABNORMAL LOW (ref 150–400)
Platelets: 150 10*3/uL (ref 150–400)
RBC: 2.67 MIL/uL — ABNORMAL LOW (ref 3.87–5.11)
RBC: 2.76 MIL/uL — ABNORMAL LOW (ref 3.87–5.11)
RBC: 2.9 MIL/uL — ABNORMAL LOW (ref 3.87–5.11)
RBC: 2.98 MIL/uL — ABNORMAL LOW (ref 3.87–5.11)
RBC: 3.09 MIL/uL — ABNORMAL LOW (ref 3.87–5.11)
RDW: 16.5 % — ABNORMAL HIGH (ref 11.5–15.5)
RDW: 16.7 % — ABNORMAL HIGH (ref 11.5–15.5)
RDW: 16.7 % — ABNORMAL HIGH (ref 11.5–15.5)
RDW: 17 % — ABNORMAL HIGH (ref 11.5–15.5)
RDW: 17.1 % — ABNORMAL HIGH (ref 11.5–15.5)
WBC: 7 10*3/uL (ref 4.0–10.5)
WBC: 8.3 10*3/uL (ref 4.0–10.5)
WBC: 8.4 10*3/uL (ref 4.0–10.5)
WBC: 8.5 10*3/uL (ref 4.0–10.5)
WBC: 9.6 10*3/uL (ref 4.0–10.5)
nRBC: 1.1 % — ABNORMAL HIGH (ref 0.0–0.2)
nRBC: 1.4 % — ABNORMAL HIGH (ref 0.0–0.2)
nRBC: 1.5 % — ABNORMAL HIGH (ref 0.0–0.2)
nRBC: 1.6 % — ABNORMAL HIGH (ref 0.0–0.2)
nRBC: 2.6 % — ABNORMAL HIGH (ref 0.0–0.2)

## 2020-11-22 LAB — PROCALCITONIN: Procalcitonin: 0.17 ng/mL

## 2020-11-22 LAB — PHOSPHORUS: Phosphorus: 2 mg/dL — ABNORMAL LOW (ref 2.5–4.6)

## 2020-11-22 LAB — CBG MONITORING, ED
Glucose-Capillary: 255 mg/dL — ABNORMAL HIGH (ref 70–99)
Glucose-Capillary: 219 mg/dL — ABNORMAL HIGH (ref 70–99)
Glucose-Capillary: 183 mg/dL — ABNORMAL HIGH (ref 70–99)

## 2020-11-22 LAB — HEPATITIS PANEL, ACUTE
HCV Ab: NONREACTIVE
Hep A IgM: NONREACTIVE
Hep B C IgM: NONREACTIVE
Hepatitis B Surface Ag: NONREACTIVE

## 2020-11-22 LAB — RESP PANEL BY RT-PCR (FLU A&B, COVID) ARPGX2
Influenza A by PCR: NEGATIVE
Influenza B by PCR: NEGATIVE
SARS Coronavirus 2 by RT PCR: NEGATIVE

## 2020-11-22 LAB — TROPONIN I (HIGH SENSITIVITY)
Troponin I (High Sensitivity): 101 ng/L (ref <18)
Troponin I (High Sensitivity): 106 ng/L (ref <18)

## 2020-11-22 LAB — PREPARE RBC (CROSSMATCH)

## 2020-11-22 LAB — PROTIME-INR
INR: 1.2 (ref 0.8–1.2)
Prothrombin Time: 14.7 seconds (ref 11.4–15.2)

## 2020-11-22 LAB — MRSA NEXT GEN BY PCR, NASAL: MRSA by PCR Next Gen: NOT DETECTED

## 2020-11-22 LAB — MAGNESIUM: Magnesium: 2.2 mg/dL (ref 1.7–2.4)

## 2020-11-22 LAB — HEMOGLOBIN A1C
Hgb A1c MFr Bld: 7.8 % — ABNORMAL HIGH (ref 4.8–5.6)
Mean Plasma Glucose: 177.16 mg/dL

## 2020-11-22 LAB — HIV ANTIBODY (ROUTINE TESTING W REFLEX): HIV Screen 4th Generation wRfx: NONREACTIVE

## 2020-11-22 LAB — LIPASE, BLOOD: Lipase: 80 U/L — ABNORMAL HIGH (ref 11–51)

## 2020-11-22 MED ORDER — B COMPLEX-C PO TABS
1.0000 | ORAL_TABLET | Freq: Every day | ORAL | Status: DC
  Administered 2020-11-23 – 2020-11-26 (×4): 1 via ORAL
  Filled 2020-11-22 (×4): qty 1

## 2020-11-22 MED ORDER — METOPROLOL TARTRATE 5 MG/5ML IV SOLN: 5.0000 mg | Freq: Three times a day (TID) | INTRAVENOUS | Status: DC | PRN

## 2020-11-22 MED ORDER — VANCOMYCIN HCL 1250 MG/250ML IV SOLN
1250.0000 mg | INTRAVENOUS | Status: DC
  Administered 2020-11-23: 1250 mg via INTRAVENOUS
  Filled 2020-11-22: qty 250

## 2020-11-22 MED ORDER — ATORVASTATIN CALCIUM 40 MG PO TABS: 80.0000 mg | ORAL_TABLET | Freq: Every day | ORAL | Status: DC

## 2020-11-22 MED ORDER — PANTOPRAZOLE SODIUM 40 MG PO TBEC
40.0000 mg | DELAYED_RELEASE_TABLET | Freq: Every day | ORAL | Status: DC
  Administered 2020-11-23 – 2020-11-26 (×4): 40 mg via ORAL
  Filled 2020-11-22 (×4): qty 1

## 2020-11-22 MED ORDER — METOPROLOL TARTRATE 25 MG PO TABS
25.0000 mg | ORAL_TABLET | Freq: Two times a day (BID) | ORAL | Status: DC
  Administered 2020-11-22 – 2020-11-25 (×7): 25 mg via ORAL
  Filled 2020-11-22 (×7): qty 1

## 2020-11-22 MED ORDER — IOHEXOL 350 MG/ML SOLN
100.0000 mL | Freq: Once | INTRAVENOUS | Status: AC | PRN
  Administered 2020-11-22: 100 mL via INTRAVENOUS

## 2020-11-22 MED ORDER — INSULIN ASPART 100 UNIT/ML IJ SOLN
0.0000 [IU] | INTRAMUSCULAR | Status: DC
  Administered 2020-11-22: 2 [IU] via SUBCUTANEOUS
  Administered 2020-11-22 (×2): 3 [IU] via SUBCUTANEOUS
  Administered 2020-11-22: 5 [IU] via SUBCUTANEOUS
  Administered 2020-11-22: 2 [IU] via SUBCUTANEOUS
  Administered 2020-11-23: 3 [IU] via SUBCUTANEOUS

## 2020-11-22 MED ORDER — HYDRALAZINE HCL 20 MG/ML IJ SOLN: 10.0000 mg | Freq: Four times a day (QID) | INTRAMUSCULAR | Status: DC | PRN

## 2020-11-22 MED ORDER — METOPROLOL TARTRATE 5 MG/5ML IV SOLN
2.5000 mg | Freq: Four times a day (QID) | INTRAVENOUS | Status: DC | PRN
  Administered 2020-11-22: 2.5 mg via INTRAVENOUS
  Filled 2020-11-22: qty 5

## 2020-11-22 MED ORDER — DEXAMETHASONE SODIUM PHOSPHATE 4 MG/ML IJ SOLN
4.0000 mg | Freq: Every day | INTRAMUSCULAR | Status: DC
  Administered 2020-11-22 – 2020-11-26 (×5): 4 mg via INTRAVENOUS
  Filled 2020-11-22 (×5): qty 1

## 2020-11-22 MED ORDER — EMPTY CONTAINERS FLEXIBLE MISC
4392.0000 [IU] | Status: AC
  Administered 2020-11-22: 4392 [IU] via INTRAVENOUS
  Filled 2020-11-22: qty 4392

## 2020-11-22 MED ORDER — VANCOMYCIN HCL 1500 MG/300ML IV SOLN
1500.0000 mg | Freq: Once | INTRAVENOUS | Status: AC
  Administered 2020-11-22: 1500 mg via INTRAVENOUS
  Filled 2020-11-22: qty 300

## 2020-11-22 MED ORDER — SODIUM CHLORIDE 0.9% IV SOLUTION: Freq: Once | INTRAVENOUS | Status: DC

## 2020-11-22 MED ORDER — POLYVINYL ALCOHOL 1.4 % OP SOLN
1.0000 [drp] | OPHTHALMIC | Status: DC | PRN
  Filled 2020-11-22: qty 15

## 2020-11-22 MED ORDER — POTASSIUM CHLORIDE 2 MEQ/ML IV SOLN
INTRAVENOUS | Status: DC
  Filled 2020-11-22 (×2): qty 1000

## 2020-11-22 MED ORDER — PIPERACILLIN-TAZOBACTAM 3.375 G IVPB
3.3750 g | Freq: Three times a day (TID) | INTRAVENOUS | Status: DC
  Administered 2020-11-22 – 2020-11-26 (×11): 3.375 g via INTRAVENOUS
  Filled 2020-11-22 (×14): qty 50

## 2020-11-22 MED ORDER — GABAPENTIN 100 MG PO CAPS
100.0000 mg | ORAL_CAPSULE | Freq: Three times a day (TID) | ORAL | Status: DC
  Administered 2020-11-22 – 2020-11-26 (×12): 100 mg via ORAL
  Filled 2020-11-22 (×12): qty 1

## 2020-11-22 MED ORDER — PIPERACILLIN-TAZOBACTAM 3.375 G IVPB 30 MIN
3.3750 g | Freq: Once | INTRAVENOUS | Status: AC
  Administered 2020-11-22: 3.375 g via INTRAVENOUS
  Filled 2020-11-22: qty 50

## 2020-11-22 MED ORDER — POLYVINYL ALCOHOL-POVIDONE PF 1.4-0.6 % OP SOLN: 1.0000 [drp] | Freq: Every day | OPHTHALMIC | Status: DC | PRN

## 2020-11-22 MED ORDER — B COMPLEX PO TABS: 1.0000 | ORAL_TABLET | Freq: Every day | ORAL | Status: DC

## 2020-11-22 NOTE — Consult Note (Addendum)
[facility] Consult: 9:06 AM 11/22/2020  LOS: 0 days    Referring Provider: Dr. Irene Pap Primary Care Physician:  [facility] Primary Gastroenterologist: Althia Forts.  Previously seen by Drs. Carol Ada and Juanita Craver but never seen in office.    Reason for Consultation: Recurrent GI bleeding.  Elevated LFTs.     HPI: Rhonda [name] is a 60 y.o. female.  PMH below including neurosarcoidosis.  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  Obesity.  IDDM.  OSA.  Multilevel spinal degenerative disease.  Chronic debilitation, wheelchair-bound.  CTAP w contrast in 09/26/2020 showed colonic diverticulosis, minimal gas and fluid in ventral surgical wound.  Aortic atherosclerosis.  Hematochezia, acute lower GI bleed with hemorrhagic shock, requiring transfusion during admission early March 2022.  07/24/2020 EGD unremarkable.  07/26/2020 colonoscopy with significant diverticulosis.  CTAP 07/23/2020 showed pan diverticulosis, focal stranding, thickening at diverticulum and proximal sigmoid, C/W uncomplicated diverticulitis.  Faint stranding at head, neck pancreas (Lipase 64).  Hepatic steatosis.  2 cm foci at falciform ligament and GB fossa, cyst versus hemangioma suggest right upper quadrant ultrasound.  Fat attenuation in left gluteus medius favor lipoma. Recurrent admission early 08/2020 w hematochezia, presumed diverticular bleed.  08/23/20 VCE showed esophageal candidiasis, small bowel erythema.  Admission 4/13-5/13/2022 with GI bleeding and perforated sigmoid diverticulitis.  Underwent 08/29/2020 CTA with coil embolization of distal arcade branch of superior sigmoid artery.  Underwent ex lap, Hartman's procedure, colostomy 4/16.  Wound VAC in place at discharge.  Eliquis initiated for multiple PE.    Left lung mass seen on  CT 09/21/2020, larger than in 10/2019, plan outpatient follow-up  Presented by EMS from home for eval dark stool and blood seen in ostomy bag.  The initial stool was quite dark red with clots.  Started yesterday morning and has persisted through this morning.  No associated abdominal pain.  No nausea, appetite is good and currently she is very hungry.  Previous bowel movements were brown, soft to loose. CTAP w angio: Extravasation of contrast proximal to left colostomy reflecting active GI bleed. IR recommended reversing Eliquis.  Patient received Kcentra.  General surgery, Dr. Rosendo Gros, sees no role for surgical intervention. Hgb 8.8 >> 8.2.  Range 8.3 to 9 in early to mid May 2022.  MCV 90, down from mid 90s.  Platelets 127, previously mid 200s. INR 1.2 BUN is not elevated, no CKD/AKI.  Sodium 131, previously 133-135.   T bili 3.2.  Alk phos 790.  AST/ALT 472/205.  Previously LFTs remarkable for Normal T bili.  Alk phos 217, AST/ALT 36/57 dating back to March 2022.  Acute hepatitis serologies nonreactive. Lipase 80. Was 12 on 3/7.   Troponin I 101... 106.    Additional issue includes LLE cellulitis after bumping leg on wheelchair.  WBCs normal.  Zosyn initiated.  Also significant hypertension.  Currently residing at home with her husband.  Past Medical History:  Diagnosis Date   Arthritis    Degenerative disc disease, lumbar    GERD (gastroesophageal reflux disease)    Hypertension  OSA (obstructive sleep apnea)    not currently on CPAP   Prediabetes    Sarcoidosis of central nervous system    Seizures (HCC)    Type 2    Urinary incontinence     Past Surgical History:  Procedure Laterality Date   ABDOMINAL HYSTERECTOMY     BOWEL RESECTION N/A 09/01/2020   Procedure: SMALL BOWEL RESECTION;  Surgeon: Georganna Skeans, MD;  Location: [place];  Service: General;  Laterality: N/A;   BREAST EXCISIONAL BIOPSY Right    BREAST SURGERY     right breast   CESAREAN SECTION     COLONOSCOPY WITH  PROPOFOL N/A 07/26/2020   Procedure: COLONOSCOPY WITH PROPOFOL;  Surgeon: Carol Ada, MD;  Location: [place];  Service: Endoscopy;  Laterality: N/A;   ESOPHAGOGASTRODUODENOSCOPY (EGD) WITH PROPOFOL N/A 07/24/2020   Procedure: ESOPHAGOGASTRODUODENOSCOPY (EGD) WITH PROPOFOL;  Surgeon: Carol Ada, MD;  Location: [place];  Service: Endoscopy;  Laterality: N/A;  Bleeding with anemia-5 gms drop in hemoglobin   GIVENS CAPSULE STUDY N/A 08/21/2020   Procedure: GIVENS CAPSULE STUDY;  Surgeon: Juanita Craver, MD;  Location[facility];  Service: Endoscopy;  Laterality: N/A;   IR ANGIOGRAM FOLLOW UP STUDY  08/29/2020   IR ANGIOGRAM SELECTIVE EACH ADDITIONAL VESSEL  08/29/2020   IR ANGIOGRAM VISCERAL SELECTIVE  08/29/2020   IR ANGIOGRAM VISCERAL SELECTIVE  08/29/2020   IR EMBO ART  VEN HEMORR LYMPH EXTRAV  INC GUIDE ROADMAPPING  08/29/2020   IR US GUIDE VASC ACCESS RIGHT  08/29/2020   LAPAROTOMY N/A 09/01/2020   Procedure: EXPLORATORY LAPAROTOMY;  Surgeon: Georganna Skeans, MD;  Location: [place];  Service: General;  Laterality: N/A;    Prior to Admission medications   Medication Sig Start Date End Date Taking? Authorizing Provider  apixaban (ELIQUIS) 5 MG TABS tablet Take 1 tablet (5 mg total) by mouth 2 (two) times daily. 10/04/20  Yes Debbe Odea, MD  atorvastatin (LIPITOR) 80 MG tablet Take 1 tablet (80 mg total) by mouth daily at 6 PM. Patient taking differently: Take 80 mg by mouth at bedtime. 05/24/19 11/22/20 Yes Dessa Phi, DO  b complex vitamins tablet Take 1 tablet by mouth daily.   Yes [provider]  blood glucose meter kit and supplies KIT Dispense based on patient and insurance preference. Use up to four times daily as directed. 07/27/20  Yes Elgergawy, Silver Huguenin, MD  Calcium Carb-Cholecalciferol (CALCIUM+D3 PO) Take 1 tablet by mouth daily with lunch.   Yes [provider]  FEROSUL 325 (65 Fe) MG tablet Take 325 mg by mouth 2 (two) times daily. 07/27/20  Yes [provider]  gabapentin (NEURONTIN) 100 MG capsule Take 100 mg by mouth 3 (three) times daily. 08/01/20  Yes [provider]  glucosamine-chondroitin 500-400 MG tablet Take 1 tablet by mouth 3 (three) times daily. Patient taking differently: Take 1 tablet by mouth 2 (two) times daily. 04/11/15  Yes Funches, Josalyn, MD  insulin glargine (LANTUS) 100 UNIT/ML injection Inject 0.14 mLs (14 Units total) into the skin at bedtime. Patient taking differently: Inject 14 Units into the skin daily. 09/28/20  Yes Debbe Odea, MD  Insulin Pen Needle 29G X 5MM MISC USE WITH LANTUS 07/27/20  Yes Elgergawy, Silver Huguenin, MD  melatonin 3 MG TABS tablet Take 1 tablet (3 mg total) by mouth at bedtime. Patient taking differently: Take 9 mg by mouth at bedtime. 01/17/20  Yes Regalado, Belkys A, MD  metoprolol tartrate (LOPRESSOR) 25 MG tablet Take 25 mg by  mouth 2 (two) times daily. 11/08/20  Yes [provider]  Multiple Vitamin (MULTIVITAMIN WITH MINERALS) TABS tablet Take 1 tablet by mouth daily with lunch.    Yes [provider]  nitrofurantoin, macrocrystal-monohydrate, (MACROBID) 100 MG capsule Take 100 mg by mouth See admin instructions. Bid x 5 days 11/16/20  Yes [provider]  Omega-3 Fatty Acids (FISH OIL) 1000 MG CAPS Take 1,000 mg by mouth daily with lunch.    Yes [provider]  ondansetron (ZOFRAN ODT) 4 MG disintegrating tablet Take 1 tablet (4 mg total) by mouth every 8 (eight) hours as needed for nausea or vomiting. 09/28/20  Yes Debbe Odea, MD  pantoprazole (PROTONIX) 40 MG tablet Take 1 tablet (40 mg total) by mouth daily. 01/17/20  Yes Regalado, Belkys A, MD  polyethylene glycol (MIRALAX / GLYCOLAX) 17 g packet Take 17 g by mouth 2 (two) times daily. Patient taking differently: Take 17 g by mouth daily as needed for mild constipation. 01/17/20  Yes Regalado, Belkys A, MD  predniSONE (DELTASONE) 20 MG tablet Take 40 mg by mouth See admin instructions. 8am and  2pm 07/17/20  Yes [provider]  REFRESH 1.4-0.6 % SOLN Place 1 drop into both eyes daily as needed (dry eyes). 10/18/20  Yes [provider]  apixaban (ELIQUIS) 5 MG TABS tablet Take 2 tablets (10 mg total) by mouth 2 (two) times daily for 6 days. Patient not taking: Reported on 11/22/2020 09/28/20 10/04/20  Debbe Odea, MD  docusate sodium (COLACE) 100 MG capsule Take 1 capsule (100 mg total) by mouth 2 (two) times daily. Patient not taking: No sig reported 09/28/20   Debbe Odea, MD  Ensure Max Protein (ENSURE MAX PROTEIN) LIQD Take 330 mLs (11 oz total) by mouth at bedtime. Patient not taking: No sig reported 09/28/20   Debbe Odea, MD  feeding supplement, GLUCERNA SHAKE, (GLUCERNA SHAKE) LIQD Take 237 mLs by mouth 2 (two) times daily between meals. Patient not taking: No sig reported 09/28/20   Debbe Odea, MD  insulin aspart (NOVOLOG) 100 UNIT/ML FlexPen Inject 1-20 Units into the skin 3 (three) times daily with meals. CBG 70 - 120: 0 units  CBG 121 - 150: 3 units  CBG 151 - 200: 4 units  CBG 201 - 250: 7 units  CBG 251 - 300: 11 units  CBG 301 - 350: 15 units  CBG 351 - 400: 20 units Patient not taking: No sig reported 09/28/20   Debbe Odea, MD  oxyCODONE (OXY IR/ROXICODONE) 5 MG immediate release tablet Take 1 tablet (5 mg total) by mouth every 4 (four) hours as needed for severe pain. Patient not taking: No sig reported 09/28/20   Debbe Odea, MD  PRESCRIPTION MEDICATION Inhale into the lungs See admin instructions. CPAP- At bedtime Patient not taking: Reported on 11/22/2020    [provider]    Scheduled Meds:  insulin aspart  0-9 Units Subcutaneous Q4H   Infusions:  lactated ringers with kcl 75 mL/hr at 11/22/20 0820   pantoprazole 8 mg/hr (11/21/20 2348)   piperacillin-tazobactam (ZOSYN)  IV     [START ON 11/23/2020] vancomycin     PRN Meds: hydrALAZINE, metoprolol tartrate   Allergies as of 11/21/2020 - Review Complete 11/21/2020  Allergen  Reaction Noted   Acetaminophen Other (See Comments) 09/01/2020    Family History  Problem Relation Age of Onset   Cancer Mother    Hypertension Sister    Diabetes Sister    Diabetes Maternal Grandmother  Social History   Socioeconomic History   Marital status: Married    Spouse name: Not on file   Number of children: 4   Years of education: 12   Highest education level: Not on file  Occupational History   Occupation: disabled  Tobacco Use   Smoking status: Never   Smokeless tobacco: Never  Vaping Use   Vaping Use: Never used  Substance and Sexual Activity   Alcohol use: No   Drug use: No   Sexual activity: Yes    Birth control/protection: None  Other Topics Concern   Not on file  Social History Narrative   Right handed   Tea sometimes, hot chocolate    Social Determinants of Health   Financial Resource Strain: Not on file  Food Insecurity: Not on file  Transportation Needs: Not on file  Physical Activity: Not on file  Stress: Not on file  Social Connections: Not on file  Intimate Partner Violence: Not on file    REVIEW OF SYSTEMS: Constitutional: Denies weakness or fatigue but she is bedbound so does not exert herself much. ENT:  No nose bleeds Pulm: Dyspnea at times, no worse now than previously. CV:  No palpitations, no LE edema.  No angina. GU:  No hematuria, no frequency GI: See HPI. Heme: Other than the blood into the ostomy, reports no unusual bruising or bleeding. Transfusions: See HPI. Neuro:  No headaches, no peripheral tingling or numbness Derm:  No itching, no rash or sores.  Endocrine:  No sweats or chills.  No polyuria or dysuria Immunization: Reviewed. Travel:  None beyond local counties in last few months.    PHYSICAL EXAM: Vital signs in last 24 hours: Vitals:   11/22/20 0600 11/22/20 0800  BP: (!) 159/110 (!) 155/117  Pulse: (!) 101   Resp: 13 11  Temp:    SpO2: (!) 85%    Wt Readings from Last 3 Encounters:  11/22/20  92.2 kg  09/28/20 92.2 kg  08/06/20 79.4 kg    General: Cushingoid appearance.  Looks chronically and somewhat acutely ill but she is alert and comfortable and provides a good medical history. Head: No facial asymmetry.  Facial swelling consistent with Cushing's. Eyes: No scleral icterus.  No conjunctival pallor.  Slight exophthalmos. Ears: Not hard of hearing Nose: No congestion or discharge Mouth: Nonspecific dotted ivory coating/lesions on the tongue.  Mucosa is moist, pink, clear.  Fair dentition. Neck: No JVD, no masses, no thyromegaly. Lungs: No labored breathing.  Lungs clear.  No cough. Heart: RRR.  No MRG.  S1, S2 present. Abdomen: Obese, soft without tenderness.  Active bowel sounds.  Bright red blood in ostomy bag.  There is a small amount of greenish soft stool as well.  Rectal: Deferred. Musc/Skeltl: No joint deformities. Extremities: Edema in the legs and feet bilaterally. Neurologic: Alert.  Appropriate.  Oriented x3.  Moves all 4 limbs but strength not evaluated.  No involuntary movement or tremor. Skin: Hyperemia, erythema at the top of the left foot reaching into some of the toes.  This area is not tender. Nodes: No cervical adenopathy Psych: Calm, pleasant, cooperative.  Fluid speech.  Intake/Output from previous day: 07/06 0701 - 07/07 0700 In: 0.4 [IV Piggyback:0.4] Out: -  Intake/Output this shift: No intake/output data recorded.  LAB RESULTS: Recent Labs    11/21/20 0000 11/22/20 0402  WBC 8.4 8.3  HGB 8.8* 8.2*  HCT 28.6* 26.3*  PLT 127* 136*   BMET Lab Results  Component Value  Date   NA 134 (L) 11/22/2020   NA 131 (L) 11/21/2020   NA 133 (L) 09/25/2020   K 3.4 (L) 11/22/2020   K 3.5 11/21/2020   K 4.4 09/25/2020   CL 98 11/22/2020   CL 93 (L) 11/21/2020   CL 98 09/25/2020   CO2 28 11/22/2020   CO2 26 11/21/2020   CO2 28 09/25/2020   GLUCOSE 264 (H) 11/22/2020   GLUCOSE 286 (H) 11/21/2020   GLUCOSE 178 (H) 09/25/2020   BUN 5 (L)  11/22/2020   BUN 7 11/21/2020   BUN 5 (L) 09/25/2020   CREATININE 0.66 11/22/2020   CREATININE 0.78 11/21/2020   CREATININE 0.39 (L) 09/25/2020   CALCIUM 8.8 (L) 11/22/2020   CALCIUM 8.9 11/21/2020   CALCIUM 8.1 (L) 09/25/2020   LFT Recent Labs    11/21/20 0000 11/22/20 0402  PROT 4.4* 4.6*  ALBUMIN 2.2* 2.1*  AST 472* 448*  ALT 205* 198*  ALKPHOS 790* 780*  BILITOT 3.1* 3.2*   PT/INR Lab Results  Component Value Date   INR 1.2 11/22/2020   INR 1.1 08/29/2020   INR 1.0 07/23/2020   Hepatitis Panel No results for input(s): HEPBSAG, HCVAB, HEPAIGM, HEPBIGM in the last 72 hours. C-Diff No components found for: CDIFF Lipase     Component Value Date/Time   LIPASE 80 (H) 11/22/2020 0402    Drugs of Abuse     Component Value Date/Time   LABOPIA NONE DETECTED 01/11/2020 1507   COCAINSCRNUR NONE DETECTED 01/11/2020 1507   LABBENZ NONE DETECTED 01/11/2020 1507   AMPHETMU NONE DETECTED 01/11/2020 1507   THCU NONE DETECTED 01/11/2020 1507   LABBARB NONE DETECTED 01/11/2020 1507     RADIOLOGY STUDIES: CT Angio Abd/Pel W and/or Wo Contrast  Result Date: 11/22/2020 CLINICAL DATA:  GI bleed, on blood thinners EXAM: CTA ABDOMEN AND PELVIS WITHOUT AND WITH CONTRAST TECHNIQUE: Multidetector CT imaging of the abdomen and pelvis was performed using the standard protocol during bolus administration of intravenous contrast. Multiplanar reconstructed images and MIPs were obtained and reviewed to evaluate the vascular anatomy. CONTRAST:  158mL OMNIPAQUE IOHEXOL 350 MG/ML SOLN COMPARISON:  CT abdomen/pelvis dated 09/26/2020 FINDINGS: VASCULAR Aorta: Patent. No evidence of abdominal aortic aneurysm. Mild atherosclerotic calcifications. Celiac: Patent. SMA: Patent. Renals: Patent bilaterally. IMA: Patent. Inflow: Patent. Proximal Outflow: Patent. Veins: Grossly unremarkable. Review of the MIP images confirms the above findings. NON-VASCULAR Lower chest: Lung bases are clear. Hepatobiliary:  Liver is within normal limits. Gallbladder is unremarkable. No intrahepatic or extrahepatic ductal dilatation. Pancreas: Within normal limits. Spleen: Within normal limits. Adrenals/Urinary Tract: Adrenal glands are within normal limits. Subcentimeter left lower pole renal cyst (series 7/image 71). Right kidney is within normal limits. No hydronephrosis. Bladder is within normal limits. Stomach/Bowel: Stomach is within normal limits. No evidence of bowel obstruction. Appendix is not discretely visualized. Status post left hemicolectomy with left lower quadrant colostomy. Scattered colonic diverticulosis. On the arterial phase, there is possible early enhancement within a diverticulum just proximal to the left mid abdominal colostomy as it exits the rectus sheath (series 10/image 111), equivocal. However, on venous phase, there is clear intraluminal extravasation of contrast in this location (series 17/image 109), confirming active GI bleeding in this location. Lymphatic: No suspicious abdominopelvic lymphadenopathy. Reproductive: Status post hysterectomy. Bilateral ovaries are within normal limits. Other: No abdominopelvic ascites. Musculoskeletal: Benign intramuscular lipoma in the left gluteal musculature (series 10/image 153). Degenerative changes of the visualized thoracolumbar spine. IMPRESSION: VASCULAR Intraluminal extravasation of contrast just proximal to  the left mid abdominal colostomy as it exits the rectus sheath, reflecting active GI bleeding, possibly diverticular in etiology. Direct endoscopic visualization via the colostomy is suggested. NON-VASCULAR Status post left hemicolectomy with left lower quadrant colostomy. Additional ancillary findings as above. Electronically Signed   By: Julian Hy M.D.   On: 11/22/2020 01:39     IMPRESSION:   Recurrent LGI bleeding in patient with hx diverticular bleed in March and April 2022.  08/29/2020 coil embolization branch of superior sigmoid artery.   Current CT with angio showing active bleeding in the region left of colostomy.  IR recommendation to reverse Eliquis.  Surgery sees no role for surgical intervention.  Suspect this is a recurrent diverticular bleed as she had pandiverticulosis on her colonoscopy.    Perforated sigmoid diverticulitis.  4/16 Hartman's procedure, colostomy.  Multiple PE, initiated Eliquis 09/2020.  Eliquis now reversed after Kcentra.  Elevated LFTs.  Significantly higher than those measured in March 2022.  CT scan of 07/23/2020 with some suggestion of pancreatitis and showed fatty liver as well as cystvs hemangioma in the region of falciform ligament and GB fossa.  On today's CT angiography the pancreas, liver, gallbladder, biliary tree reported as normal.  Patient denies any active GI symptoms such as abdominal pain, nausea, vomiting, anorexia.  Blood pressures have been significantly hypertensive during admission so hypoperfusion of liver probably not an issue.  Acute hep A/B/C serologies all nonreactive.  Hypertension with blood pressures as high as 170s-120s.  Currently 152/126.  LLE cellulitis.  Zosyn initiated.  Neurosarcoidosis. Wheelchair/bedbound.    Cushing's syndrome.  IDDM.  Right sided lung mass, stable mediastinal lymph nodes observed on CT May 2022.  Per chart review there was plan for outpatient follow-up.   PLAN:     Not sure there is any role for endoscopy here but will discuss case with Dr. Henrene Pastor.  Fortunately IR is involved and could pursue angiography, embolization if bleeding does not stop now that Eliquis has been reversed.     Reverse Eliquis as you have done.  Follow CBCs.  No reason not to let her have clear liquid diet which I ordered.  Since this is a confirmed lower GI bleed, IV Protonix drip is not necessary.  I discontinued this but will resume her usual home dose of oral Protonix.     Is she a candidate to stop Eliquis or for IVC filter?   Azucena Freed  11/22/2020, 9:06  AM Phone [phone]  GI ATTENDING  History, laboratories, prior endoscopy reports, x-rays personally reviewed..  Patient seen and examined.  Agree with comprehensive consultation note as outlined above.  Very complex patient who had recent hospitalization for presumed diverticular bleed.  Underwent embolization therapy.  Subsequent colonic perforation requiring surgical intervention.  On chronic anticoagulation for pulmonary embolism.  Multiple other medical problems.  Now presents with GI bleeding and positive CTA in the colon.  Appears to be diverticular.  Anticoagulation has been held.  Hopefully bleeding will stop with discontinuation of anticoagulation and time.  Support with transfusions as needed.  If not, IR may need to perform embolization therapy.  No role for colonoscopy or endoscopy at present.  If she is not a candidate for resumption of Eliquis, she may need IVC filter given the relatively recent issue with pulmonary embolism.  I will leave this to the primary service.  Finally, markedly elevated liver tests.  I suspect that she has had a drug reaction.  Infection being ruled out.  No  other obvious cause on imaging (such as biliary).  We will check for other entities such as autoimmune disease.  Continue to trend liver tests.  Rhonda Chuck. Geri Seminole., M.D. [facility] Gastro[facility]ology

## 2020-11-22 NOTE — H&P (Addendum)
History and Physical  Rhonda [name] QVZ:[id number] DOB: 1961/03/31 DOA: 11/21/2020  Referring physician: Joline Maxcy, PA EDP  PCP: [facility].  Outpatient Specialists: GI Patient coming from: Home.   Chief Complaint: GI bleed  HPI: Rhonda [name] is a 60 y.o. 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  She came into the ED for further evaluation.  In the ED patient had active bleeding in her colostomy and around her colostomy bag.  She had a CTA abdomen and pelvis which showed active bleeding intraluminal just proximal to the end of the colostomy site.  EDP discussed case with IR who recommended reversal of Eliquis.  Patient received Kcentra.  Patient was seen by general surgery, at that time bleeding had stopped, there is no role for surgery.  Additionally patient was found to have left lower extremity cellulitis involving her left foot posttraumatic after bumping her onto her wheelchair.  Started on broad-spectrum IV antibiotics to cover any intra-abdominal infection and cellulitis.  TRH, hospitalist team, was asked to admit.  ED Course:  Temperature 98.  BP 155/86, pulse 79, respiratory rate 14, O2 saturation 100% on room air.  Lab studies significant for WBC 8.3, hemoglobin 8.2 from 8.8.  Serum sodium 131, potassium 3.5, glucose 286, BUN 7, creatinine 0.78, alkaline phosphatase 790, AST 472, ALT 205, total bilirubin 3.1.  GFR greater than 60.  Troponin 101.  Review of Systems: Review of systems as noted in the HPI. All other systems reviewed and are negative.   Past Medical History:  Diagnosis Date   Arthritis    Degenerative disc disease, lumbar    GERD (gastroesophageal reflux disease)     Hypertension    OSA (obstructive sleep apnea)    not currently on CPAP   Prediabetes    Sarcoidosis of central nervous system    Seizures (HCC)    Type 2    Urinary incontinence    Past Surgical History:  Procedure Laterality Date   ABDOMINAL HYSTERECTOMY     BOWEL RESECTION N/A 09/01/2020   Procedure: SMALL BOWEL RESECTION;  Surgeon: Georganna Skeans, MD;  Location: [place];  Service: General;  Laterality: N/A;   BREAST EXCISIONAL BIOPSY Right    BREAST SURGERY     right breast   CESAREAN SECTION     COLONOSCOPY WITH PROPOFOL N/A 07/26/2020   Procedure: COLONOSCOPY WITH PROPOFOL;  Surgeon: Carol Ada, MD;  Location: [place];  Service: Endoscopy;  Laterality: N/A;   ESOPHAGOGASTRODUODENOSCOPY (EGD) WITH PROPOFOL N/A 07/24/2020   Procedure: ESOPHAGOGASTRODUODENOSCOPY (EGD) WITH PROPOFOL;  Surgeon: Carol Ada, MD;  Location: [place];  Service: Endoscopy;  Laterality: N/A;  Bleeding with anemia-5 gms drop in hemoglobin   GIVENS CAPSULE STUDY N/A 08/21/2020   Procedure: GIVENS CAPSULE STUDY;  Surgeon: Juanita Craver, MD;  Location[facility];  Service: Endoscopy;  Laterality: N/A;   IR ANGIOGRAM FOLLOW UP STUDY  08/29/2020   IR ANGIOGRAM SELECTIVE EACH ADDITIONAL VESSEL  08/29/2020   IR ANGIOGRAM VISCERAL SELECTIVE  08/29/2020   IR ANGIOGRAM VISCERAL SELECTIVE  08/29/2020   IR EMBO ART  VEN HEMORR LYMPH EXTRAV  INC GUIDE ROADMAPPING  08/29/2020   IR US GUIDE VASC ACCESS RIGHT  08/29/2020   LAPAROTOMY N/A 09/01/2020   Procedure: EXPLORATORY LAPAROTOMY;  Surgeon:  Georganna Skeans, MD;  Location: [place];  Service: General;  Laterality: N/A;    Social History:  reports that she has never smoked. She has never used smokeless tobacco. She reports that she does not drink alcohol and does not use drugs.   Allergies  Allergen Reactions   Aspirin Swelling   Acetaminophen Other (See Comments)    Patient states tylenol makes her sick.     Family History  Problem Relation Age of Onset    Cancer Mother    Hypertension Sister    Diabetes Sister    Diabetes Maternal Grandmother       Prior to Admission medications   Medication Sig Start Date End Date Taking? Authorizing Provider  apixaban (ELIQUIS) 5 MG TABS tablet Take 1 tablet (5 mg total) by mouth 2 (two) times daily. 10/04/20  Yes Debbe Odea, MD  atorvastatin (LIPITOR) 80 MG tablet Take 1 tablet (80 mg total) by mouth daily at 6 PM. Patient taking differently: Take 80 mg by mouth at bedtime. 05/24/19 11/22/20 Yes Dessa Phi, DO  b complex vitamins tablet Take 1 tablet by mouth daily.   Yes [provider]  blood glucose meter kit and supplies KIT Dispense based on patient and insurance preference. Use up to four times daily as directed. 07/27/20  Yes Elgergawy, Silver Huguenin, MD  Calcium Carb-Cholecalciferol (CALCIUM+D3 PO) Take 1 tablet by mouth daily with lunch.   Yes [provider]  FEROSUL 325 (65 Fe) MG tablet Take 325 mg by mouth 2 (two) times daily. 07/27/20  Yes [provider]  gabapentin (NEURONTIN) 100 MG capsule Take 100 mg by mouth 3 (three) times daily. 08/01/20  Yes [provider]  glucosamine-chondroitin 500-400 MG tablet Take 1 tablet by mouth 3 (three) times daily. Patient taking differently: Take 1 tablet by mouth 2 (two) times daily. 04/11/15  Yes Funches, Josalyn, MD  insulin glargine (LANTUS) 100 UNIT/ML injection Inject 0.14 mLs (14 Units total) into the skin at bedtime. Patient taking differently: Inject 14 Units into the skin daily. 09/28/20  Yes Debbe Odea, MD  Insulin Pen Needle 29G X 5MM MISC USE WITH LANTUS 07/27/20  Yes Elgergawy, Silver Huguenin, MD  melatonin 3 MG TABS tablet Take 1 tablet (3 mg total) by mouth at bedtime. Patient taking differently: Take 9 mg by mouth at bedtime. 01/17/20  Yes Regalado, Belkys A, MD  metoprolol tartrate (LOPRESSOR) 25 MG tablet Take 25 mg by mouth 2 (two) times daily. 11/08/20  Yes [provider]  Multiple Vitamin  (MULTIVITAMIN WITH MINERALS) TABS tablet Take 1 tablet by mouth daily with lunch.    Yes [provider]  nitrofurantoin, macrocrystal-monohydrate, (MACROBID) 100 MG capsule Take 100 mg by mouth See admin instructions. Bid x 5 days 11/16/20  Yes [provider]  Omega-3 Fatty Acids (FISH OIL) 1000 MG CAPS Take 1,000 mg by mouth daily with lunch.    Yes [provider]  ondansetron (ZOFRAN ODT) 4 MG disintegrating tablet Take 1 tablet (4 mg total) by mouth every 8 (eight) hours as needed for nausea or vomiting. 09/28/20  Yes Debbe Odea, MD  pantoprazole (PROTONIX) 40 MG tablet Take 1 tablet (40 mg total) by mouth daily. 01/17/20  Yes Regalado, Belkys A, MD  polyethylene glycol (MIRALAX / GLYCOLAX) 17 g packet Take 17 g by mouth 2 (two) times daily. Patient taking differently: Take 17 g by mouth daily as needed for mild constipation. 01/17/20  Yes Regalado, Belkys A, MD  predniSONE (DELTASONE) 20  MG tablet Take 40 mg by mouth See admin instructions. 8am and 2pm 07/17/20  Yes [provider]  REFRESH 1.4-0.6 % SOLN Place 1 drop into both eyes daily as needed (dry eyes). 10/18/20  Yes [provider]  apixaban (ELIQUIS) 5 MG TABS tablet Take 2 tablets (10 mg total) by mouth 2 (two) times daily for 6 days. Patient not taking: Reported on 11/22/2020 09/28/20 10/04/20  Debbe Odea, MD  docusate sodium (COLACE) 100 MG capsule Take 1 capsule (100 mg total) by mouth 2 (two) times daily. Patient not taking: No sig reported 09/28/20   Debbe Odea, MD  Ensure Max Protein (ENSURE MAX PROTEIN) LIQD Take 330 mLs (11 oz total) by mouth at bedtime. Patient not taking: No sig reported 09/28/20   Debbe Odea, MD  feeding supplement, GLUCERNA SHAKE, (GLUCERNA SHAKE) LIQD Take 237 mLs by mouth 2 (two) times daily between meals. Patient not taking: No sig reported 09/28/20   Debbe Odea, MD  insulin aspart (NOVOLOG) 100 UNIT/ML FlexPen Inject 1-20 Units into the skin 3 (three) times  daily with meals. CBG 70 - 120: 0 units  CBG 121 - 150: 3 units  CBG 151 - 200: 4 units  CBG 201 - 250: 7 units  CBG 251 - 300: 11 units  CBG 301 - 350: 15 units  CBG 351 - 400: 20 units Patient not taking: No sig reported 09/28/20   Debbe Odea, MD  oxyCODONE (OXY IR/ROXICODONE) 5 MG immediate release tablet Take 1 tablet (5 mg total) by mouth every 4 (four) hours as needed for severe pain. Patient not taking: No sig reported 09/28/20   Debbe Odea, MD  PRESCRIPTION MEDICATION Inhale into the lungs See admin instructions. CPAP- At bedtime Patient not taking: Reported on 11/22/2020    [provider]    Physical Exam: BP (!) 185/85   Pulse (!) 108   Temp 98 F (36.7 C)   Resp 18   Ht _0  (1.6 m)   Wt 92.2 kg   SpO2 97%   BMI 36.01 kg/m   General: 60 y.o. year-old female well developed well nourished in no acute distress.  Alert and oriented x3. Cardiovascular: Regular rate and rhythm with no rubs or gallops.  No thyromegaly or JVD noted.  No lower extremity edema. 2/4 pulses in all 4 extremities. Respiratory: Clear to auscultation with no wheezes or rales. Good inspiratory effort. Abdomen: Soft nontender nondistended with normal bowel sounds x4 quadrants.  Colostomy bag, at the time of this visit patient was actively bleeding around colostomy bag. Muskuloskeletal: 2+ pitting edema in lower extremities bilaterally.  Left foot erythematous, edematous and warm  Neuro: CN II-XII intact, strength, sensation, reflexes.  Minimal sensation from lower extremities, feels pressure with palpation. Skin: Left foot erythematous, edematous and warm Psychiatry: Judgement and insight appear normal. Mood is appropriate for condition and setting          Labs on Admission:  Basic Metabolic Panel: Recent Labs  Lab 11/21/20 0000  NA 131*  K 3.5  CL 93*  CO2 26  GLUCOSE 286*  BUN 7  CREATININE 0.78  CALCIUM 8.9   Liver Function Tests: Recent Labs  Lab 11/21/20 0000  AST  472*  ALT 205*  ALKPHOS 790*  BILITOT 3.1*  PROT 4.4*  ALBUMIN 2.2*   No results for input(s): LIPASE, AMYLASE in the last 168 hours. No results for input(s): AMMONIA in the last 168 hours. CBC: Recent Labs  Lab 11/21/20 0000  WBC 8.4  NEUTROABS 7.4  HGB 8.8*  HCT 28.6*  MCV 92.6  PLT 127*   Cardiac Enzymes: No results for input(s): CKTOTAL, CKMB, CKMBINDEX, TROPONINI in the last 168 hours.  BNP (last 3 results) Recent Labs    06/18/20 0006  BNP 55.3    ProBNP (last 3 results) No results for input(s): PROBNP in the last 8760 hours.  CBG: No results for input(s): GLUCAP in the last 168 hours.  Radiological Exams on Admission: CT Angio Abd/Pel W and/or Wo Contrast  Result Date: 11/22/2020 CLINICAL DATA:  GI bleed, on blood thinners EXAM: CTA ABDOMEN AND PELVIS WITHOUT AND WITH CONTRAST TECHNIQUE: Multidetector CT imaging of the abdomen and pelvis was performed using the standard protocol during bolus administration of intravenous contrast. Multiplanar reconstructed images and MIPs were obtained and reviewed to evaluate the vascular anatomy. CONTRAST:  117m OMNIPAQUE IOHEXOL 350 MG/ML SOLN COMPARISON:  CT abdomen/pelvis dated 09/26/2020 FINDINGS: VASCULAR Aorta: Patent. No evidence of abdominal aortic aneurysm. Mild atherosclerotic calcifications. Celiac: Patent. SMA: Patent. Renals: Patent bilaterally. IMA: Patent. Inflow: Patent. Proximal Outflow: Patent. Veins: Grossly unremarkable. Review of the MIP images confirms the above findings. NON-VASCULAR Lower chest: Lung bases are clear. Hepatobiliary: Liver is within normal limits. Gallbladder is unremarkable. No intrahepatic or extrahepatic ductal dilatation. Pancreas: Within normal limits. Spleen: Within normal limits. Adrenals/Urinary Tract: Adrenal glands are within normal limits. Subcentimeter left lower pole renal cyst (series 7/image 71). Right kidney is within normal limits. No hydronephrosis. Bladder is within normal  limits. Stomach/Bowel: Stomach is within normal limits. No evidence of bowel obstruction. Appendix is not discretely visualized. Status post left hemicolectomy with left lower quadrant colostomy. Scattered colonic diverticulosis. On the arterial phase, there is possible early enhancement within a diverticulum just proximal to the left mid abdominal colostomy as it exits the rectus sheath (series 10/image 111), equivocal. However, on venous phase, there is clear intraluminal extravasation of contrast in this location (series 17/image 109), confirming active GI bleeding in this location. Lymphatic: No suspicious abdominopelvic lymphadenopathy. Reproductive: Status post hysterectomy. Bilateral ovaries are within normal limits. Other: No abdominopelvic ascites. Musculoskeletal: Benign intramuscular lipoma in the left gluteal musculature (series 10/image 153). Degenerative changes of the visualized thoracolumbar spine. IMPRESSION: VASCULAR Intraluminal extravasation of contrast just proximal to the left mid abdominal colostomy as it exits the rectus sheath, reflecting active GI bleeding, possibly diverticular in etiology. Direct endoscopic visualization via the colostomy is suggested. NON-VASCULAR Status post left hemicolectomy with left lower quadrant colostomy. Additional ancillary findings as above. Electronically Signed   By: SJulian HyM.D.   On: 11/22/2020 01:39    EKG: I independently viewed the EKG done and my findings are as followed: Sinus rhythm rate of 79.  QTc 448.  Assessment/Plan Present on Admission:  GI bleed  Active Problems:   GI bleed  Acute GI bleed, lower and suspected upper Acute blood loss anemia Presented initially with melena then hematochezia. CT evidence of active bleeding Hemoglobin 8.2 from 8.8 from 9.0 in May 13,22. 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 Seen by general surgery, highly appreciate evaluation of the patient. GI [place] consulted for possible EGD. Monitor H&H Empirically covered with Zosyn, DC when recommended by GI.  Left foot cellulitis, POA Bumped her left foot on her wheelchair about a week ago. MRSA screening positive on 08/30/2020 Empirically covered with IV vancomycin  Acute transaminitis, unclear  etiology Acute hepatitis panel pending. Avoid hepatotoxic agents  Hyperbilirubinemia Presented with T bilirubin 3.1 On CT scan gallbladder appears unremarkable with no evidence of intra or extra hepatic ductal dilatation. Repeat CMP in the morning  Elevated troponin, suspect demand ischemia in the setting of acute GI bleed Presenting troponin 101 No evidence of acute ischemia on twelve-lead EKG Denies any anginal symptoms Cycle troponin  Hypertension BP is currently not at goal, elevated Resume home oral antihypertensives Monitor vital signs  Physical debility/wheelchair-bound Fall precautions.   DVT prophylaxis: Eliquis on hold, reversed on 11/22/2020.  Code Status: Full code  Family Communication: Husband at bedside  Disposition Plan: Admitted to progressive unit  Consults called: GI, General surgery, interventional radiology.  Admission status: Inpatient status.  Patient will require at least 2 midnights for further evaluation and treatment of present condition.   Status is: Inpatient    Dispo: The patient is from: Home.              Anticipated d/c is to: Home.              Patient currently not stable for discharge.    Difficult to place patient, not applicable.        Kayleen Memos MD [facility] Pager [phone]  If 7PM-7AM, please contact night-coverage [email or web address] Password [email or web address]  11/22/2020, 3:59 AM

## 2020-11-22 NOTE — Progress Notes (Signed)
Arrived on unit. Medication and fluid compatibility checked through [employer]. All are compatible. Primary RN made aware and will run together. Instructed RN to place a consult if any problems with current IV arises. Verbalized understanding.

## 2020-11-22 NOTE — ED Notes (Signed)
Ostomy bag changed. 

## 2020-11-22 NOTE — Progress Notes (Signed)
Pharmacy Antibiotic Note  Rhonda [name] is a 60 y.o. female admitted on 11/21/2020 with  intra-abdominal infection .  Pharmacy has been consulted for Vancomycin/Zosyn dosing. WBC WNL. Renal function good.   Plan: Vancomycin 1500 mg IV x 1, then 1250 mg IV q24h >>Estimated AUC: 471 Zosyn 3.375G IV q8h to be infused over 4 hours Trend WBC, temp, renal function  F/U infectious work-up Drug levels as indicated   Height: 5\' 3"  (160 cm) Weight: 92.2 kg (203 lb 4.2 oz) IBW/kg (Calculated) : 52.4  Temp (24hrs), Avg:97.7 F (36.5 C), Min:97.3 F (36.3 C), Max:98 F (36.7 C)  Recent Labs  Lab 11/21/20 0000  WBC 8.4  CREATININE 0.78    Estimated Creatinine Clearance: 81.6 mL/min (by C-G formula based on SCr of 0.78 mg/dL).    Allergies  Allergen Reactions   Aspirin Swelling   Acetaminophen Other (See Comments)    Patient states tylenol makes her sick.    Narda Bonds, PharmD, BCPS Clinical Pharmacist Phone: [phone]

## 2020-11-22 NOTE — ED Notes (Signed)
Patient transported to CT 

## 2020-11-22 NOTE — Plan of Care (Signed)
  Problem: Education: Goal: Knowledge of General Education information will improve Description: Including pain rating scale, medication(s)/side effects and non-pharmacologic comfort measures Outcome: Progressing   Problem: Clinical Measurements: Goal: Ability to maintain clinical measurements within normal limits will improve Outcome: Progressing   Problem: Clinical Measurements: Goal: Diagnostic test results will improve Outcome: Progressing   

## 2020-11-22 NOTE — ED Notes (Signed)
Multiple attempts made to get pt's lab collected with no success,

## 2020-11-22 NOTE — Consult Note (Signed)
Chief Complaint: Patient was seen in consultation today for  Chief Complaint  Patient presents with   GI Problem    Referring Physician(s): Dr. Irene Pap   Supervising Physician: Corrie Mckusick  Patient Status: [facility] - ED  History of Present Illness: Rhonda [name] is a 60 y.o. female with a medical history significant for HTN, cushing's syndrome, neurosarcoidosis, PE (on Eliquis) and GI bleeds. She has had multiple hospitalizations related to lower GI bleeds.  She presented to the ED April 2022 with persistent lower GI bleeding and imaging showed a bleed in the distal descending colon. She was seen in IR 08/29/20 for a percutaneous coil embolization of a distal arcade branch of the superior sigmoidal artery. On 09/02/20 she went to the OR secondary to a perforated diverticulitis and underwent a partial colectomy, colostomy and Hartmann procedure. She was discharged to a skilled nursing facility 09/28/20.   She was brought to the ED by EMS on 11/21/20 for bloody output from her colostomy. Imaging was ordered.   CT Angio Abdomen Pelvis without and with contrast 11/22/20 IMPRESSION: VASCULAR Intraluminal extravasation of contrast just proximal to the left mid abdominal colostomy as it exits the rectus sheath, reflecting active GI bleeding, possibly diverticular in etiology. Direct endoscopic visualization via the colostomy is suggested. NON-VASCULAR Status post left hemicolectomy with left lower quadrant colostomy. Additional ancillary findings as above.   The patient received Kcentra to reverse the Eliquis and the bleeding from the colostomy site stopped. Interventional Radiology was requested to evaluate this patient for possible intervention related to GI bleed.   Past Medical History:  Diagnosis Date   Arthritis    Degenerative disc disease, lumbar    GERD (gastroesophageal reflux disease)    Hypertension    OSA (obstructive sleep apnea)    not currently on CPAP    Prediabetes    Sarcoidosis of central nervous system    Seizures (HCC)    Type 2    Urinary incontinence     Past Surgical History:  Procedure Laterality Date   ABDOMINAL HYSTERECTOMY     BOWEL RESECTION N/A 09/01/2020   Procedure: SMALL BOWEL RESECTION;  Surgeon: Georganna Skeans, MD;  Location: [place];  Service: General;  Laterality: N/A;   BREAST EXCISIONAL BIOPSY Right    BREAST SURGERY     right breast   CESAREAN SECTION     COLONOSCOPY WITH PROPOFOL N/A 07/26/2020   Procedure: COLONOSCOPY WITH PROPOFOL;  Surgeon: Carol Ada, MD;  Location: [place];  Service: Endoscopy;  Laterality: N/A;   ESOPHAGOGASTRODUODENOSCOPY (EGD) WITH PROPOFOL N/A 07/24/2020   Procedure: ESOPHAGOGASTRODUODENOSCOPY (EGD) WITH PROPOFOL;  Surgeon: Carol Ada, MD;  Location: [place];  Service: Endoscopy;  Laterality: N/A;  Bleeding with anemia-5 gms drop in hemoglobin   GIVENS CAPSULE STUDY N/A 08/21/2020   Procedure: GIVENS CAPSULE STUDY;  Surgeon: Juanita Craver, MD;  Location: [facility] ENDOSCOPY;  Service: Endoscopy;  Laterality: N/A;   IR ANGIOGRAM FOLLOW UP STUDY  08/29/2020   IR ANGIOGRAM SELECTIVE EACH ADDITIONAL VESSEL  08/29/2020   IR ANGIOGRAM VISCERAL SELECTIVE  08/29/2020   IR ANGIOGRAM VISCERAL SELECTIVE  08/29/2020   IR EMBO ART  VEN HEMORR LYMPH EXTRAV  INC GUIDE ROADMAPPING  08/29/2020   IR US GUIDE VASC ACCESS RIGHT  08/29/2020   LAPAROTOMY N/A 09/01/2020   Procedure: EXPLORATORY LAPAROTOMY;  Surgeon: Georganna Skeans, MD;  Location: [place];  Service: General;  Laterality: N/A;    Allergies: Aspirin and Acetaminophen  Medications: Prior to Admission medications  Medication Sig Start Date End Date Taking? Authorizing Provider  apixaban (ELIQUIS) 5 MG TABS tablet Take 1 tablet (5 mg total) by mouth 2 (two) times daily. 10/04/20  Yes Debbe Odea, MD  atorvastatin (LIPITOR) 80 MG tablet Take 1 tablet (80 mg total) by mouth daily at 6 PM. Patient taking differently: Take 80 mg by mouth at  bedtime. 05/24/19 11/22/20 Yes Dessa Phi, DO  b complex vitamins tablet Take 1 tablet by mouth daily.   Yes [provider]  blood glucose meter kit and supplies KIT Dispense based on patient and insurance preference. Use up to four times daily as directed. 07/27/20  Yes Elgergawy, Silver Huguenin, MD  Calcium Carb-Cholecalciferol (CALCIUM+D3 PO) Take 1 tablet by mouth daily with lunch.   Yes [provider]  FEROSUL 325 (65 Fe) MG tablet Take 325 mg by mouth 2 (two) times daily. 07/27/20  Yes [provider]  gabapentin (NEURONTIN) 100 MG capsule Take 100 mg by mouth 3 (three) times daily. 08/01/20  Yes [provider]  glucosamine-chondroitin 500-400 MG tablet Take 1 tablet by mouth 3 (three) times daily. Patient taking differently: Take 1 tablet by mouth 2 (two) times daily. 04/11/15  Yes Funches, Josalyn, MD  insulin glargine (LANTUS) 100 UNIT/ML injection Inject 0.14 mLs (14 Units total) into the skin at bedtime. Patient taking differently: Inject 14 Units into the skin daily. 09/28/20  Yes Debbe Odea, MD  Insulin Pen Needle 29G X 5MM MISC USE WITH LANTUS 07/27/20  Yes Elgergawy, Silver Huguenin, MD  melatonin 3 MG TABS tablet Take 1 tablet (3 mg total) by mouth at bedtime. Patient taking differently: Take 9 mg by mouth at bedtime. 01/17/20  Yes Regalado, Belkys A, MD  metoprolol tartrate (LOPRESSOR) 25 MG tablet Take 25 mg by mouth 2 (two) times daily. 11/08/20  Yes [provider]  Multiple Vitamin (MULTIVITAMIN WITH MINERALS) TABS tablet Take 1 tablet by mouth daily with lunch.    Yes [provider]  nitrofurantoin, macrocrystal-monohydrate, (MACROBID) 100 MG capsule Take 100 mg by mouth See admin instructions. Bid x 5 days 11/16/20  Yes [provider]  Omega-3 Fatty Acids (FISH OIL) 1000 MG CAPS Take 1,000 mg by mouth daily with lunch.    Yes [provider]  ondansetron (ZOFRAN ODT) 4 MG disintegrating tablet Take 1 tablet (4 mg total)  by mouth every 8 (eight) hours as needed for nausea or vomiting. 09/28/20  Yes Debbe Odea, MD  pantoprazole (PROTONIX) 40 MG tablet Take 1 tablet (40 mg total) by mouth daily. 01/17/20  Yes Regalado, Belkys A, MD  polyethylene glycol (MIRALAX / GLYCOLAX) 17 g packet Take 17 g by mouth 2 (two) times daily. Patient taking differently: Take 17 g by mouth daily as needed for mild constipation. 01/17/20  Yes Regalado, Belkys A, MD  predniSONE (DELTASONE) 20 MG tablet Take 40 mg by mouth See admin instructions. 8am and 2pm 07/17/20  Yes [provider]  REFRESH 1.4-0.6 % SOLN Place 1 drop into both eyes daily as needed (dry eyes). 10/18/20  Yes [provider]  apixaban (ELIQUIS) 5 MG TABS tablet Take 2 tablets (10 mg total) by mouth 2 (two) times daily for 6 days. Patient not taking: Reported on 11/22/2020 09/28/20 10/04/20  Debbe Odea, MD  docusate sodium (COLACE) 100 MG capsule Take 1 capsule (100 mg total) by mouth 2 (two) times daily. Patient not taking: No sig reported 09/28/20   Debbe Odea, MD  Ensure Max Protein (ENSURE MAX PROTEIN)  LIQD Take 330 mLs (11 oz total) by mouth at bedtime. Patient not taking: No sig reported 09/28/20   Debbe Odea, MD  feeding supplement, GLUCERNA SHAKE, (GLUCERNA SHAKE) LIQD Take 237 mLs by mouth 2 (two) times daily between meals. Patient not taking: No sig reported 09/28/20   Debbe Odea, MD  insulin aspart (NOVOLOG) 100 UNIT/ML FlexPen Inject 1-20 Units into the skin 3 (three) times daily with meals. CBG 70 - 120: 0 units  CBG 121 - 150: 3 units  CBG 151 - 200: 4 units  CBG 201 - 250: 7 units  CBG 251 - 300: 11 units  CBG 301 - 350: 15 units  CBG 351 - 400: 20 units Patient not taking: No sig reported 09/28/20   Debbe Odea, MD  oxyCODONE (OXY IR/ROXICODONE) 5 MG immediate release tablet Take 1 tablet (5 mg total) by mouth every 4 (four) hours as needed for severe pain. Patient not taking: No sig reported 09/28/20   Debbe Odea, MD   PRESCRIPTION MEDICATION Inhale into the lungs See admin instructions. CPAP- At bedtime Patient not taking: Reported on 11/22/2020    [provider]     Family History  Problem Relation Age of Onset   Cancer Mother    Hypertension Sister    Diabetes Sister    Diabetes Maternal Grandmother     Social History   Socioeconomic History   Marital status: Married    Spouse name: Not on file   Number of children: 4   Years of education: 12   Highest education level: Not on file  Occupational History   Occupation: disabled  Tobacco Use   Smoking status: Never   Smokeless tobacco: Never  Vaping Use   Vaping Use: Never used  Substance and Sexual Activity   Alcohol use: No   Drug use: No   Sexual activity: Yes    Birth control/protection: None  Other Topics Concern   Not on file  Social History Narrative   Right handed   Tea sometimes, hot chocolate    Social Determinants of Health   Financial Resource Strain: Not on file  Food Insecurity: Not on file  Transportation Needs: Not on file  Physical Activity: Not on file  Stress: Not on file  Social Connections: Not on file    Review of Systems: A 12 point ROS discussed and pertinent positives are indicated in the HPI above.  All other systems are negative.  Review of Systems  Cardiovascular:  Positive for leg swelling.  Gastrointestinal:  Positive for blood in stool. Negative for abdominal pain, nausea and vomiting.       Colostomy   Vital Signs: BP (!) 155/117   Pulse (!) 101   Temp 98 F (36.7 C)   Resp 11   Ht 5' 3" (1.6 m)   Wt 203 lb 4.2 oz (92.2 kg)   SpO2 (!) 85%   BMI 36.01 kg/m   Physical Exam Constitutional:      General: She is not in acute distress.    Appearance: She is obese.  Eyes:     Conjunctiva/sclera:     Right eye: Chemosis present.     Left eye: Chemosis present.  Cardiovascular:     Rate and Rhythm: Regular rhythm. Tachycardia present.  Pulmonary:     Effort: Pulmonary  effort is normal.     Breath sounds: Normal breath sounds.  Abdominal:     General: Bowel sounds are normal.     Palpations: Abdomen  is soft.     Tenderness: There is no abdominal tenderness.     Comments: Colostomy. 1-2 oz of thin, blood-tinged output with some clots observed in ostomy pouch.   Musculoskeletal:     Right lower leg: Edema present.     Left lower leg: Edema present.     Comments: +4 bilateral lower extremity pitting edema   Neurological:     Mental Status: She is alert and oriented to person, place, and time.    Imaging: CT Angio Abd/Pel W and/or Wo Contrast  Result Date: 11/22/2020 CLINICAL DATA:  GI bleed, on blood thinners EXAM: CTA ABDOMEN AND PELVIS WITHOUT AND WITH CONTRAST TECHNIQUE: Multidetector CT imaging of the abdomen and pelvis was performed using the standard protocol during bolus administration of intravenous contrast. Multiplanar reconstructed images and MIPs were obtained and reviewed to evaluate the vascular anatomy. CONTRAST:  155m OMNIPAQUE IOHEXOL 350 MG/ML SOLN COMPARISON:  CT abdomen/pelvis dated 09/26/2020 FINDINGS: VASCULAR Aorta: Patent. No evidence of abdominal aortic aneurysm. Mild atherosclerotic calcifications. Celiac: Patent. SMA: Patent. Renals: Patent bilaterally. IMA: Patent. Inflow: Patent. Proximal Outflow: Patent. Veins: Grossly unremarkable. Review of the MIP images confirms the above findings. NON-VASCULAR Lower chest: Lung bases are clear. Hepatobiliary: Liver is within normal limits. Gallbladder is unremarkable. No intrahepatic or extrahepatic ductal dilatation. Pancreas: Within normal limits. Spleen: Within normal limits. Adrenals/Urinary Tract: Adrenal glands are within normal limits. Subcentimeter left lower pole renal cyst (series 7/image 71). Right kidney is within normal limits. No hydronephrosis. Bladder is within normal limits. Stomach/Bowel: Stomach is within normal limits. No evidence of bowel obstruction. Appendix is not discretely  visualized. Status post left hemicolectomy with left lower quadrant colostomy. Scattered colonic diverticulosis. On the arterial phase, there is possible early enhancement within a diverticulum just proximal to the left mid abdominal colostomy as it exits the rectus sheath (series 10/image 111), equivocal. However, on venous phase, there is clear intraluminal extravasation of contrast in this location (series 17/image 109), confirming active GI bleeding in this location. Lymphatic: No suspicious abdominopelvic lymphadenopathy. Reproductive: Status post hysterectomy. Bilateral ovaries are within normal limits. Other: No abdominopelvic ascites. Musculoskeletal: Benign intramuscular lipoma in the left gluteal musculature (series 10/image 153). Degenerative changes of the visualized thoracolumbar spine. IMPRESSION: VASCULAR Intraluminal extravasation of contrast just proximal to the left mid abdominal colostomy as it exits the rectus sheath, reflecting active GI bleeding, possibly diverticular in etiology. Direct endoscopic visualization via the colostomy is suggested. NON-VASCULAR Status post left hemicolectomy with left lower quadrant colostomy. Additional ancillary findings as above. Electronically Signed   By: SJulian HyM.D.   On: 11/22/2020 01:39    Labs:  CBC: Recent Labs    09/27/20 0020 09/28/20 0224 11/21/20 0000 11/22/20 0402  WBC 15.6* 15.5* 8.4 8.3  HGB 8.5* 9.0* 8.8* 8.2*  HCT 28.5* 30.1* 28.6* 26.3*  PLT 245 235 127* 136*    COAGS: Recent Labs    01/07/20 2235 07/23/20 0347 08/29/20 2003 11/22/20 0403  INR 1.0 1.0 1.1 1.2  APTT 24  --   --  34    BMP: Recent Labs    01/16/20 2014 01/16/20 2314 01/17/20 0259 01/17/20 0546 05/06/20 0109 09/24/20 0356 09/25/20 0358 11/21/20 0000 11/22/20 0402  NA 127* 128* 127* 128*   < > 133* 133* 131* 134*  K 4.5 4.6 4.5 4.6   < > 4.0 4.4 3.5 3.4*  CL 94* 94* 92* 94*   < > 100 98 93* 98  CO2 _0 < >  _0 GLUCOSE 110* 185* 207* 164*   < > 225* 178* 286* 264*  BUN _1 < > 8 5* 7 5*  CALCIUM 8.2* 8.2* 8.1* 8.3*   < > 7.9* 8.1* 8.9 8.8*  CREATININE 0.61 0.73 0.82 0.66   < > 0.45 0.39* 0.78 0.66  GFRNONAA >60 >60 >60 >60   < > >60 >60 >60 >60  GFRAA >60 >60 >60 >60  --   --   --   --   --    < > = values in this interval not displayed.    LIVER FUNCTION TESTS: Recent Labs    09/22/20 0346 09/24/20 0356 11/21/20 0000 11/22/20 0402  BILITOT 0.4 <0.1* 3.1* 3.2*  AST 24 17 472* 448*  ALT 45* 29 205* 198*  ALKPHOS 150* 130* 790* 780*  PROT 4.8* 4.6* 4.4* 4.6*  ALBUMIN 2.0* 1.9* 2.2* 2.1*    TUMOR MARKERS: No results for input(s): AFPTM, CEA, CA199, CHROMGRNA in the last 8760 hours.  Assessment and Plan:  GI Bleed: Rhonda [name], 60 year old female, was assessed at the bedside in the ED. She is awake/alert and denies any pain or discomfort. Her only complaint is hunger and she requested to eat. Her colostomy site was assessed and approximately 1-2 oz of thin, blood-tinged fluid and some clots were in the ostomy pouch. The patient's hemoglobin at the time of assessment was 8.3, vital signs stable.   IR recommends serial H&H and monitoring of output from colostomy. No plan for IR intervention at this time. Please call IR if patient's condition changes.  Thank you for this interesting consult.  I greatly enjoyed meeting Rhonda [name] and look forward to participating in their care.  A copy of this report was sent to the requesting provider on this date.  Electronically Signed: Soyla Dryer, AGACNP-BC [phone] 11/22/2020, 8:42 AM   I spent a total of 20 Minutes    in face to face in clinical consultation, greater than 50% of which was counseling/coordinating care for GI bleed

## 2020-11-22 NOTE — Progress Notes (Signed)
Subjective/Chief Complaint: Patient is a 60 year old female who was recently discharged status post GI bleed status post angioembolization of the IMA on 413.  Patient subsequently suffered a necrotic section of proximal sigmoid colon with perforation and feculent peritonitis.  Patient underwent partial colectomy, colostomy and Hartman's procedure on 09/01/2020.  Patient's recent discharge on 09/28/2020. According to the patient's husband patient had some bleeding from her ostomy beginning tonight.  Patient has a history of PEs and was on Eliquis. Patient states that the stool has been melanotic since discharge. Patient underwent CT scan which was significant for a active blush intraluminal just proximal to the end of the colostomy site.  Patient had her Eliquis reversed with Kcentra.  I did review patient's CT scan   I did review the patient's CT scan and laboratory studies personally.  Objective: Vital signs in last 24 hours: Temp:  [97.3 F (36.3 C)-98 F (36.7 C)] 98 F (36.7 C) (07/07 0150) Pulse Rate:  [76-108] 79 (07/07 0400) Resp:  [14-22] 14 (07/07 0400) BP: (144-185)/(85-121) 157/86 (07/07 0400) SpO2:  [97 %-100 %] 100 % (07/07 0400) Weight:  [92.2 kg] 92.2 kg (07/07 0114)    Intake/Output from previous day: 07/06 0701 - 07/07 0700 In: 0.4 [IV Piggyback:0.4] Out: -  Intake/Output this shift: Total I/O In: 0.4 [IV Piggyback:0.4] Out: -   PE:  Constitutional: No acute distress, conversant, appears states age. Eyes: Anicteric sclerae, moist conjunctiva, no lid lag Lungs: Clear to auscultation bilaterally, normal respiratory effort CV: regular rate and rhythm, no murmurs, no peripheral edema, pedal pulses 2+ GI: Soft, no masses or hepatosplenomegaly, non-tender to palpation, ostomy in place, dark gray stool in place, clots around the ostomy bag, no active bleeding once all stool removed from colostomy. Skin: No rashes, palpation reveals normal turgor Psychiatric:  appropriate judgment and insight, oriented to person, place, and time   Lab Results:  Recent Labs    11/21/20 0000  WBC 8.4  HGB 8.8*  HCT 28.6*  PLT 127*   BMET Recent Labs    11/21/20 0000  NA 131*  K 3.5  CL 93*  CO2 26  GLUCOSE 286*  BUN 7  CREATININE 0.78  CALCIUM 8.9   PT/INR No results for input(s): LABPROT, INR in the last 72 hours. ABG No results for input(s): PHART, HCO3 in the last 72 hours.  Invalid input(s): PCO2, PO2  Studies/Results: CT Angio Abd/Pel W and/or Wo Contrast  Result Date: 11/22/2020 CLINICAL DATA:  GI bleed, on blood thinners EXAM: CTA ABDOMEN AND PELVIS WITHOUT AND WITH CONTRAST TECHNIQUE: Multidetector CT imaging of the abdomen and pelvis was performed using the standard protocol during bolus administration of intravenous contrast. Multiplanar reconstructed images and MIPs were obtained and reviewed to evaluate the vascular anatomy. CONTRAST:  127mL OMNIPAQUE IOHEXOL 350 MG/ML SOLN COMPARISON:  CT abdomen/pelvis dated 09/26/2020 FINDINGS: VASCULAR Aorta: Patent. No evidence of abdominal aortic aneurysm. Mild atherosclerotic calcifications. Celiac: Patent. SMA: Patent. Renals: Patent bilaterally. IMA: Patent. Inflow: Patent. Proximal Outflow: Patent. Veins: Grossly unremarkable. Review of the MIP images confirms the above findings. NON-VASCULAR Lower chest: Lung bases are clear. Hepatobiliary: Liver is within normal limits. Gallbladder is unremarkable. No intrahepatic or extrahepatic ductal dilatation. Pancreas: Within normal limits. Spleen: Within normal limits. Adrenals/Urinary Tract: Adrenal glands are within normal limits. Subcentimeter left lower pole renal cyst (series 7/image 71). Right kidney is within normal limits. No hydronephrosis. Bladder is within normal limits. Stomach/Bowel: Stomach is within normal limits. No evidence of bowel obstruction. Appendix is  not discretely visualized. Status post left hemicolectomy with left lower quadrant  colostomy. Scattered colonic diverticulosis. On the arterial phase, there is possible early enhancement within a diverticulum just proximal to the left mid abdominal colostomy as it exits the rectus sheath (series 10/image 111), equivocal. However, on venous phase, there is clear intraluminal extravasation of contrast in this location (series 17/image 109), confirming active GI bleeding in this location. Lymphatic: No suspicious abdominopelvic lymphadenopathy. Reproductive: Status post hysterectomy. Bilateral ovaries are within normal limits. Other: No abdominopelvic ascites. Musculoskeletal: Benign intramuscular lipoma in the left gluteal musculature (series 10/image 153). Degenerative changes of the visualized thoracolumbar spine. IMPRESSION: VASCULAR Intraluminal extravasation of contrast just proximal to the left mid abdominal colostomy as it exits the rectus sheath, reflecting active GI bleeding, possibly diverticular in etiology. Direct endoscopic visualization via the colostomy is suggested. NON-VASCULAR Status post left hemicolectomy with left lower quadrant colostomy. Additional ancillary findings as above. Electronically Signed   By: Julian Hy M.D.   On: 11/22/2020 01:39    Anti-infectives: Anti-infectives (From admission, onward)    Start     Dose/Rate Route Frequency Ordered Stop   11/23/20 0600  vancomycin (VANCOREADY) IVPB 1250 mg/250 mL        1,250 mg 166.7 mL/hr over 90 Minutes Intravenous Every 24 hours 11/22/20 0354     11/22/20 1400  piperacillin-tazobactam (ZOSYN) IVPB 3.375 g        3.375 g 12.5 mL/hr over 240 Minutes Intravenous Every 8 hours 11/22/20 0343     11/22/20 0345  vancomycin (VANCOREADY) IVPB 1500 mg/300 mL        1,500 mg 150 mL/hr over 120 Minutes Intravenous  Once 11/22/20 0343     11/22/20 0345  piperacillin-tazobactam (ZOSYN) IVPB 3.375 g        3.375 g 100 mL/hr over 30 Minutes Intravenous  Once 11/22/20 0343         Assessment/Plan: Patient is a  60 year old female status post colostomy for perforation, Hartman's procedure, history of PE on Eliquis.  Patient with GI bleed on tonights admission. Patient had her Eliquis reversed.  The bleeding appears to have ceased. At this point no surgery recommended. Patient may require upper endoscopy as patient's had melanotic stools.  At this time no role for surgery as bleeding has stopped.  If patient has further bleeding or issues feel free to call us back.  LOS: 0 days    Ralene Ok 11/22/2020

## 2020-11-22 NOTE — ED Notes (Signed)
Date and time results received: 11/22/20   Critical Value: troponin Name of Provider Notified: 101

## 2020-11-22 NOTE — ED Notes (Signed)
Attempted report, floor RN with new admit and will call back for report asap.

## 2020-11-22 NOTE — Progress Notes (Signed)
PROGRESS NOTE                                                                                                                                                                                                             Patient Demographics:    Rhonda [name], is a 60 y.o. female, DOB - 11/25/1960, MVE:[id number]  Outpatient Primary MD for the patient is [place] date - 11/21/2020    Chief Complaint  Patient presents with   GI Problem       Brief Narrative (HPI from H&P)  Rhonda [name] is a 60 y.o. female with medical history significant for perforated diverticulitis status post partial colectomy, colostomy, Hartmann procedure and angioembolization of the IMA by IR in April 2022, neurosarcoidosis, pulmonary embolism on Eliquis who presented to [facility] ED due to noted melena then later hematochezia from colostomy bag.   Subjective:    Rhonda [name] today has, No headache, No chest pain, No abdominal pain - No Nausea, No new weakness tingling or numbness, no SOB.   Assessment  & Plan :     Acute LGI bleed with acute blood loss related anemia. She has had history of lower GI bleed in the past requiring embolization and eventually colectomy and colostomy.  She was also on Eliquis.  Eliquis has been reversed in the ER, H&H being followed, currently looks like bleeding has resolved.  GI IR and CCS on board.  Continue to monitor clinically, clear liquids and IV PPI.  2.  Left foot cellulitis present on admission.  Patient hit her left foot on the door, on IV vancomycin, MRSA nasal PCR negative, monitor closely no abscess yet.  3.  Asymptomatic transaminitis with hyperbilirubinemia.  Blood pressure stable, patient is asymptomatic, was on high intensity statin which will be held, monitor numbers.  4.  Hypertension.  Placed on home dose beta-blocker and monitor.  5.  History of neurosarcoidosis.  On  chronic steroids, continue IV Decadron for now.  6.  History of PE.  Was on Eliquis which is on hold due to ongoing GI bleed.  Monitor closely.  Depending on clinical course may require IVC filter.  7.  Mildly elevated troponin caused by demand mismatch from anemia, chest pain-free, EKG nonacute, no further work-up needed.  8.  Hypokalemia.  Replaced.  Obesity: Follow with PCP Estimated body mass index is 36.01 kg/m as calculated from the following:   Height as of this encounter: 5\' 3"  (1.6 m).   Weight as of this encounter: 92.2 kg.          Condition - Guarded  Family Communication  :  none  Code Status :  Full  Consults  :  GI,CCS, IR  PUD Prophylaxis : PPI   Procedures  :     CT - Intraluminal extravasation of contrast just proximal to the left mid abdominal colostomy as it exits the rectus sheath, reflecting active GI bleeding, possibly diverticular in etiology. Direct endoscopic visualization via the colostomy is suggested. NON-VASCULAR Status post left hemicolectomy with left lower quadrant colostomy. Additional ancillary findings as above      Disposition Plan  :    Status is: Inpatient  Remains inpatient appropriate because:IV treatments appropriate due to intensity of illness or inability to take PO  Dispo: The patient is from: Home              Anticipated d/c is to: Home              Patient currently is not medically stable to d/c.   Difficult to place patient No   DVT Prophylaxis  :    SCDs Start: 11/22/20 0258   Lab Results  Component Value Date   PLT 150 11/22/2020    Diet :  Diet Order             Diet clear liquid Room service appropriate? Yes; Fluid consistency: Thin  Diet effective now                    Inpatient Medications  Scheduled Meds:  b complex vitamins  1 tablet Oral Daily   gabapentin  100 mg Oral TID   insulin aspart  0-9 Units Subcutaneous Q4H   metoprolol tartrate  25 mg Oral BID   [START ON 11/23/2020]  pantoprazole  40 mg Oral Q0600   Continuous Infusions:  lactated ringers with kcl 75 mL/hr at 11/22/20 0820   piperacillin-tazobactam (ZOSYN)  IV     [START ON 11/23/2020] vancomycin     PRN Meds:.hydrALAZINE, metoprolol tartrate, Polyvinyl Alcohol-Povidone PF  Antibiotics  :    Anti-infectives (From admission, onward)    Start     Dose/Rate Route Frequency Ordered Stop   11/23/20 0600  vancomycin (VANCOREADY) IVPB 1250 mg/250 mL        1,250 mg 166.7 mL/hr over 90 Minutes Intravenous Every 24 hours 11/22/20 0354     11/22/20 1400  piperacillin-tazobactam (ZOSYN) IVPB 3.375 g        3.375 g 12.5 mL/hr over 240 Minutes Intravenous Every 8 hours 11/22/20 0343     11/22/20 0345  vancomycin (VANCOREADY) IVPB 1500 mg/300 mL        1,500 mg 150 mL/hr over 120 Minutes Intravenous  Once 11/22/20 0343 11/22/20 0820   11/22/20 0345  piperacillin-tazobactam (ZOSYN) IVPB 3.375 g        3.375 g 100 mL/hr over 30 Minutes Intravenous  Once 11/22/20 0343 11/22/20 8937        Time Spent in minutes  [place] M.D on 11/22/2020 at 12:21 PM  To page go to [email or web address]   [facility] -  Office  [phone]   See all Orders from today for further details    Objective:   Vitals:  11/22/20 0800 11/22/20 0950 11/22/20 1015 11/22/20 1100  BP: (!) 155/117 (!) 144/110 (!) 144/103 (!) 152/126  Pulse:  96  (!) 50  Resp: 11 12 (!) 25 18  Temp:      TempSrc:      SpO2:  95%  90%  Weight:      Height:        Wt Readings from Last 3 Encounters:  11/22/20 92.2 kg  09/28/20 92.2 kg  08/06/20 79.4 kg     Intake/Output Summary (Last 24 hours) at 11/22/2020 1221 Last data filed at 11/22/2020 0112 Gross per 24 hour  Intake 0.4 ml  Output --  Net 0.4 ml     Physical Exam  Awake Alert, No new F.N deficits, Normal affect [place].AT,PERRAL Supple Neck,No JVD, No cervical lymphadenopathy appriciated.  Symmetrical Chest wall movement, Good air movement bilaterally, CTAB RRR,No  Gallops,Rubs or new Murmurs, No Parasternal Heave +ve B.Sounds, Abd Soft, No tenderness, No organomegaly appriciated, No rebound - guarding or rigidity. No Cyanosis, L. Foot red and warm, colostomy bag with stool     Data Review:    CBC Recent Labs  Lab 11/21/20 0000 11/22/20 0402 11/22/20 0826  WBC 8.4 8.3 8.5  HGB 8.8* 8.2* 8.3*  HCT 28.6* 26.3* 27.5*  PLT 127* 136* 150  MCV 92.6 90.7 92.3  MCH 28.5 28.3 27.9  MCHC 30.8 31.2 30.2  RDW 16.7* 16.5* 16.7*  LYMPHSABS 0.5* 0.4* 0.5*  MONOABS 0.3 0.2 0.3  EOSABS 0.0 0.0 0.0  BASOSABS 0.0 0.0 0.0    Recent Labs  Lab 11/21/20 0000 11/22/20 0345 11/22/20 0402 11/22/20 0403  NA 131*  --  134*  --   K 3.5  --  3.4*  --   CL 93*  --  98  --   CO2 26  --  28  --   GLUCOSE 286*  --  264*  --   BUN 7  --  5*  --   CREATININE 0.78  --  0.66  --   CALCIUM 8.9  --  8.8*  --   AST 472*  --  448*  --   ALT 205*  --  198*  --   ALKPHOS 790*  --  780*  --   BILITOT 3.1*  --  3.2*  --   ALBUMIN 2.2*  --  2.1*  --   MG  --  2.2  --   --   INR  --   --   --  1.2    ------------------------------------------------------------------------------------------------------------------ No results for input(s): CHOL, HDL, LDLCALC, TRIG, CHOLHDL, LDLDIRECT in the last 72 hours.  Lab Results  Component Value Date   HGBA1C 11.8 (H) 07/23/2020   ------------------------------------------------------------------------------------------------------------------ No results for input(s): TSH, T4TOTAL, T3FREE, THYROIDAB in the last 72 hours.  Invalid input(s): FREET3  Cardiac Enzymes No results for input(s): CKMB, TROPONINI, MYOGLOBIN in the last 168 hours.  Invalid input(s): CK ------------------------------------------------------------------------------------------------------------------    Component Value Date/Time   BNP 55.3 06/18/2020 0006    Micro Results Recent Results (from the past 240 hour(s))  Resp Panel by RT-PCR (Flu  A&B, Covid) Nasopharyngeal Swab     Status: None   Collection Time: 11/22/20  2:11 AM   Specimen: Nasopharyngeal Swab; Nasopharyngeal(NP) swabs in vial transport medium  Result Value Ref Range Status   SARS Coronavirus 2 by RT PCR NEGATIVE NEGATIVE Final    Comment: (NOTE) SARS-CoV-2 target nucleic acids are NOT DETECTED.  The SARS-CoV-2 RNA is generally  detectable in upper respiratory specimens during the acute phase of infection. The lowest concentration of SARS-CoV-2 viral copies this assay can detect is 138 copies/mL. A negative result does not preclude SARS-Cov-2 infection and should not be used as the sole basis for treatment or other patient management decisions. A negative result may occur with  improper specimen collection/handling, submission of specimen other than nasopharyngeal swab, presence of viral mutation(s) within the areas targeted by this assay, and inadequate number of viral copies(<138 copies/mL). A negative result must be combined with clinical observations, patient history, and epidemiological information. The expected result is Negative.  Fact Sheet for Patients:  EntrepreneurPulse.com.au  Fact Sheet for Healthcare Providers:  IncredibleEmployment.be  This test is no t yet approved or cleared by the [place] FDA and  has been authorized for detection and/or diagnosis of SARS-CoV-2 by FDA under an Emergency Use Authorization (EUA). This EUA will remain  in effect (meaning this test can be used) for the duration of the COVID-19 declaration under Section 564(b)(1) of the Act, 21 U.S.C.section 360bbb-3(b)(1), unless the authorization is terminated  or revoked sooner.       Influenza A by PCR NEGATIVE NEGATIVE Final   Influenza B by PCR NEGATIVE NEGATIVE Final    Comment: (NOTE) The Xpert Xpress SARS-CoV-2/FLU/RSV plus assay is intended as an aid in the diagnosis of influenza from Nasopharyngeal swab specimens  and should not be used as a sole basis for treatment. Nasal washings and aspirates are unacceptable for Xpert Xpress SARS-CoV-2/FLU/RSV testing.  Fact Sheet for Patients: EntrepreneurPulse.com.au  Fact Sheet for Healthcare Providers: IncredibleEmployment.be  This test is not yet approved or cleared by the [place] FDA and has been authorized for detection and/or diagnosis of SARS-CoV-2 by FDA under an Emergency Use Authorization (EUA). This EUA will remain in effect (meaning this test can be used) for the duration of the COVID-19 declaration under Section 564(b)(1) of the Act, 21 U.S.C. section 360bbb-3(b)(1), unless the authorization is terminated or revoked.  Performed at [facility], [place] [address] [place], [place] [address]   MRSA Next Gen by PCR, Nasal     Status: None   Collection Time: 11/22/20  5:18 AM   Specimen: Nasal Mucosa; Nasal Swab  Result Value Ref Range Status   MRSA by PCR Next Gen NOT DETECTED NOT DETECTED Final    Comment: (NOTE) The GeneXpert MRSA Assay (FDA approved for NASAL specimens only), is one component of a comprehensive MRSA colonization surveillance program. It is not intended to diagnose MRSA infection nor to guide or monitor treatment for MRSA infections. Test performance is not FDA approved in patients less than 54 years old. Performed at [facility], [place] [address], [place], [place] [address]     Radiology Reports CT Angio Abd/Pel W and/or Wo Contrast  Result Date: 11/22/2020 CLINICAL DATA:  GI bleed, on blood thinners EXAM: CTA ABDOMEN AND PELVIS WITHOUT AND WITH CONTRAST TECHNIQUE: Multidetector CT imaging of the abdomen and pelvis was performed using the standard protocol during bolus administration of intravenous contrast. Multiplanar reconstructed images and MIPs were obtained and reviewed to evaluate the vascular anatomy. CONTRAST:  185mL OMNIPAQUE IOHEXOL 350 MG/ML SOLN COMPARISON:   CT abdomen/pelvis dated 09/26/2020 FINDINGS: VASCULAR Aorta: Patent. No evidence of abdominal aortic aneurysm. Mild atherosclerotic calcifications. Celiac: Patent. SMA: Patent. Renals: Patent bilaterally. IMA: Patent. Inflow: Patent. Proximal Outflow: Patent. Veins: Grossly unremarkable. Review of the MIP images confirms the above findings. NON-VASCULAR Lower chest: Lung bases are clear.  Hepatobiliary: Liver is within normal limits. Gallbladder is unremarkable. No intrahepatic or extrahepatic ductal dilatation. Pancreas: Within normal limits. Spleen: Within normal limits. Adrenals/Urinary Tract: Adrenal glands are within normal limits. Subcentimeter left lower pole renal cyst (series 7/image 71). Right kidney is within normal limits. No hydronephrosis. Bladder is within normal limits. Stomach/Bowel: Stomach is within normal limits. No evidence of bowel obstruction. Appendix is not discretely visualized. Status post left hemicolectomy with left lower quadrant colostomy. Scattered colonic diverticulosis. On the arterial phase, there is possible early enhancement within a diverticulum just proximal to the left mid abdominal colostomy as it exits the rectus sheath (series 10/image 111), equivocal. However, on venous phase, there is clear intraluminal extravasation of contrast in this location (series 17/image 109), confirming active GI bleeding in this location. Lymphatic: No suspicious abdominopelvic lymphadenopathy. Reproductive: Status post hysterectomy. Bilateral ovaries are within normal limits. Other: No abdominopelvic ascites. Musculoskeletal: Benign intramuscular lipoma in the left gluteal musculature (series 10/image 153). Degenerative changes of the visualized thoracolumbar spine. IMPRESSION: VASCULAR Intraluminal extravasation of contrast just proximal to the left mid abdominal colostomy as it exits the rectus sheath, reflecting active GI bleeding, possibly diverticular in etiology. Direct endoscopic  visualization via the colostomy is suggested. NON-VASCULAR Status post left hemicolectomy with left lower quadrant colostomy. Additional ancillary findings as above. Electronically Signed   By: Julian Hy M.D.   On: 11/22/2020 01:39

## 2020-11-22 NOTE — ED Notes (Signed)
Report given to [facility]

## 2020-11-23 ENCOUNTER — Inpatient Hospital Stay (HOSPITAL_COMMUNITY): Payer: 59

## 2020-11-23 DIAGNOSIS — K921 Melena: Secondary | ICD-10-CM

## 2020-11-23 DIAGNOSIS — K5731 Diverticulosis of large intestine without perforation or abscess with bleeding: Secondary | ICD-10-CM

## 2020-11-23 DIAGNOSIS — R7989 Other specified abnormal findings of blood chemistry: Secondary | ICD-10-CM

## 2020-11-23 DIAGNOSIS — M7989 Other specified soft tissue disorders: Secondary | ICD-10-CM

## 2020-11-23 DIAGNOSIS — D62 Acute posthemorrhagic anemia: Secondary | ICD-10-CM

## 2020-11-23 DIAGNOSIS — R609 Edema, unspecified: Secondary | ICD-10-CM

## 2020-11-23 DIAGNOSIS — K5730 Diverticulosis of large intestine without perforation or abscess without bleeding: Secondary | ICD-10-CM

## 2020-11-23 LAB — GLUCOSE, CAPILLARY
Glucose-Capillary: 107 mg/dL — ABNORMAL HIGH (ref 70–99)
Glucose-Capillary: 108 mg/dL — ABNORMAL HIGH (ref 70–99)
Glucose-Capillary: 110 mg/dL — ABNORMAL HIGH (ref 70–99)
Glucose-Capillary: 118 mg/dL — ABNORMAL HIGH (ref 70–99)
Glucose-Capillary: 143 mg/dL — ABNORMAL HIGH (ref 70–99)
Glucose-Capillary: 152 mg/dL — ABNORMAL HIGH (ref 70–99)
Glucose-Capillary: 204 mg/dL — ABNORMAL HIGH (ref 70–99)
Glucose-Capillary: 57 mg/dL — ABNORMAL LOW (ref 70–99)
Glucose-Capillary: 60 mg/dL — ABNORMAL LOW (ref 70–99)
Glucose-Capillary: 61 mg/dL — ABNORMAL LOW (ref 70–99)
Glucose-Capillary: 67 mg/dL — ABNORMAL LOW (ref 70–99)
Glucose-Capillary: 70 mg/dL (ref 70–99)

## 2020-11-23 LAB — CBC WITH DIFFERENTIAL/PLATELET
Abs Immature Granulocytes: 0.18 10*3/uL — ABNORMAL HIGH (ref 0.00–0.07)
Abs Immature Granulocytes: 0.2 10*3/uL — ABNORMAL HIGH (ref 0.00–0.07)
Basophils Absolute: 0 10*3/uL (ref 0.0–0.1)
Basophils Absolute: 0 10*3/uL (ref 0.0–0.1)
Basophils Relative: 0 %
Basophils Relative: 0 %
Eosinophils Absolute: 0 10*3/uL (ref 0.0–0.5)
Eosinophils Absolute: 0 10*3/uL (ref 0.0–0.5)
Eosinophils Relative: 0 %
Eosinophils Relative: 0 %
HCT: 22.3 % — ABNORMAL LOW (ref 36.0–46.0)
HCT: 22.3 % — ABNORMAL LOW (ref 36.0–46.0)
Hemoglobin: 7 g/dL — ABNORMAL LOW (ref 12.0–15.0)
Hemoglobin: 7.1 g/dL — ABNORMAL LOW (ref 12.0–15.0)
Immature Granulocytes: 3 %
Immature Granulocytes: 3 %
Lymphocytes Relative: 4 %
Lymphocytes Relative: 7 %
Lymphs Abs: 0.3 10*3/uL — ABNORMAL LOW (ref 0.7–4.0)
Lymphs Abs: 0.5 10*3/uL — ABNORMAL LOW (ref 0.7–4.0)
MCH: 28.2 pg (ref 26.0–34.0)
MCH: 28.9 pg (ref 26.0–34.0)
MCHC: 31.4 g/dL (ref 30.0–36.0)
MCHC: 31.8 g/dL (ref 30.0–36.0)
MCV: 89.9 fL (ref 80.0–100.0)
MCV: 90.7 fL (ref 80.0–100.0)
Monocytes Absolute: 0.2 10*3/uL (ref 0.1–1.0)
Monocytes Absolute: 0.2 10*3/uL (ref 0.1–1.0)
Monocytes Relative: 3 %
Monocytes Relative: 3 %
Neutro Abs: 6.5 10*3/uL (ref 1.7–7.7)
Neutro Abs: 6.3 10*3/uL (ref 1.7–7.7)
Neutrophils Relative %: 90 %
Neutrophils Relative %: 87 %
Platelets: 123 10*3/uL — ABNORMAL LOW (ref 150–400)
Platelets: 102 10*3/uL — ABNORMAL LOW (ref 150–400)
RBC: 2.48 MIL/uL — ABNORMAL LOW (ref 3.87–5.11)
RBC: 2.46 MIL/uL — ABNORMAL LOW (ref 3.87–5.11)
RDW: 17 % — ABNORMAL HIGH (ref 11.5–15.5)
RDW: 17.2 % — ABNORMAL HIGH (ref 11.5–15.5)
WBC: 7.3 10*3/uL (ref 4.0–10.5)
WBC: 7.2 10*3/uL (ref 4.0–10.5)
nRBC: 2.1 % — ABNORMAL HIGH (ref 0.0–0.2)
nRBC: 2.2 % — ABNORMAL HIGH (ref 0.0–0.2)

## 2020-11-23 LAB — CBC
HCT: 31.6 % — ABNORMAL LOW (ref 36.0–46.0)
Hemoglobin: 10.6 g/dL — ABNORMAL LOW (ref 12.0–15.0)
MCH: 29.4 pg (ref 26.0–34.0)
MCHC: 33.5 g/dL (ref 30.0–36.0)
MCV: 87.8 fL (ref 80.0–100.0)
Platelets: 98 10*3/uL — ABNORMAL LOW (ref 150–400)
RBC: 3.6 MIL/uL — ABNORMAL LOW (ref 3.87–5.11)
RDW: 15.4 % (ref 11.5–15.5)
WBC: 7.7 10*3/uL (ref 4.0–10.5)
nRBC: 1.3 % — ABNORMAL HIGH (ref 0.0–0.2)

## 2020-11-23 LAB — COMPREHENSIVE METABOLIC PANEL
ALT: 169 U/L — ABNORMAL HIGH (ref 0–44)
AST: 320 U/L — ABNORMAL HIGH (ref 15–41)
Albumin: 1.9 g/dL — ABNORMAL LOW (ref 3.5–5.0)
Alkaline Phosphatase: 583 U/L — ABNORMAL HIGH (ref 38–126)
Anion gap: 5 (ref 5–15)
BUN: <5 mg/dL — ABNORMAL LOW (ref 6–20)
CO2: 30 mmol/L (ref 22–32)
Calcium: 8.4 mg/dL — ABNORMAL LOW (ref 8.9–10.3)
Chloride: 99 mmol/L (ref 98–111)
Creatinine, Ser: 0.47 mg/dL (ref 0.44–1.00)
GFR, Estimated: >60 mL/min (ref >60)
Glucose, Bld: 123 mg/dL — ABNORMAL HIGH (ref 70–99)
Potassium: 3.5 mmol/L (ref 3.5–5.1)
Sodium: 134 mmol/L — ABNORMAL LOW (ref 135–145)
Total Bilirubin: 2.3 mg/dL — ABNORMAL HIGH (ref 0.3–1.2)
Total Protein: 4 g/dL — ABNORMAL LOW (ref 6.5–8.1)

## 2020-11-23 LAB — MAGNESIUM: Magnesium: 2 mg/dL (ref 1.7–2.4)

## 2020-11-23 LAB — PREPARE RBC (CROSSMATCH)

## 2020-11-23 LAB — BRAIN NATRIURETIC PEPTIDE: B Natriuretic Peptide: 160.8 pg/mL — ABNORMAL HIGH (ref 0.0–100.0)

## 2020-11-23 LAB — PROCALCITONIN: Procalcitonin: 0.13 ng/mL

## 2020-11-23 MED ORDER — FUROSEMIDE 10 MG/ML IJ SOLN
20.0000 mg | Freq: Once | INTRAMUSCULAR | Status: AC
  Administered 2020-11-23: 20 mg via INTRAVENOUS
  Filled 2020-11-23: qty 2

## 2020-11-23 MED ORDER — SODIUM CHLORIDE 0.9 % IV SOLN
100.0000 mg | Freq: Two times a day (BID) | INTRAVENOUS | Status: DC
  Administered 2020-11-23 – 2020-11-26 (×6): 100 mg via INTRAVENOUS
  Filled 2020-11-23 (×7): qty 100

## 2020-11-23 MED ORDER — SODIUM CHLORIDE 0.9% IV SOLUTION: Freq: Once | INTRAVENOUS | Status: AC

## 2020-11-23 MED ORDER — INSULIN ASPART 100 UNIT/ML IJ SOLN
0.0000 [IU] | Freq: Three times a day (TID) | INTRAMUSCULAR | Status: DC
  Administered 2020-11-23: 2 [IU] via SUBCUTANEOUS
  Administered 2020-11-24: 1 [IU] via SUBCUTANEOUS
  Administered 2020-11-24: 2 [IU] via SUBCUTANEOUS
  Administered 2020-11-24: 1 [IU] via SUBCUTANEOUS
  Administered 2020-11-25: 3 [IU] via SUBCUTANEOUS
  Administered 2020-11-25 (×2): 1 [IU] via SUBCUTANEOUS

## 2020-11-23 MED ORDER — DIPHENHYDRAMINE HCL 50 MG/ML IJ SOLN: 25.0000 mg | Freq: Three times a day (TID) | INTRAMUSCULAR | Status: AC | PRN

## 2020-11-23 NOTE — Evaluation (Signed)
Physical Therapy Evaluation Patient Details Name: Rhonda [name] MRN: [id number] DOB: April 16, 1961 Today's Date: 11/23/2020   History of Present Illness  60yo female admitted 11/21/20 with c/o melena and hematochezia from colostomy bag. Found to have lower and suspected upper GIB, L foot cellulitis. 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 Spouse present and reports that they ended up leaving SNF early due to poor care, really would prefer to return home at this point. Needed up to [place] just for rolling in bed, will definitely need +2 assist to progress mobility. Left in bed positioned to comfort with all needs met, bed alarm active, spouse present. Will benefit from skilled HHPT f/u and DME as below.     Follow Up Recommendations Home health PT;Supervision/Assistance - 24 hour    Equipment Recommendations  Hospital bed;Other (comment);3in1 (PT) (hoyer lift and pads)    Recommendations for Other Services       Precautions / Restrictions Precautions Precautions: Fall;Other (comment) Precaution Comments: colostomy bag Restrictions Weight Bearing Restrictions: No      Mobility  Bed Mobility Overal bed mobility: Needs Assistance Bed Mobility: Rolling Rolling: Max assist         General bed mobility comments: able to roll to her right with MinA, but needed MaxA to roll to the left; will require +2 assist for safety of patient and staff to progress mobility    Transfers                 General transfer comment: deferred- will need +2 assist  Ambulation/Gait             General Gait Details: unable at baseline  Stairs            Wheelchair Mobility    Modified Rankin (Stroke Patients Only)       Balance                                              Pertinent Vitals/Pain Pain Assessment: Faces Faces Pain Scale: No hurt Pain Intervention(s): Limited activity within patient's tolerance;Monitored during session    Home Living Family/patient expects to be discharged to:: Private residence Living Arrangements: Non-relatives/Friends;Other relatives Available Help at Discharge: Family;Available 24 hours/day Type of Home: House Home Access: Ramped entrance     Home Layout: Two level;1/2 bath on main level;Able to live on main level with bedroom/bathroom Home Equipment: Bedside commode;Walker - 2 wheels;Wheelchair - manual Additional Comments: family reports they brought her home early from SNF due to poor care at that facility, has been pretty much total care at home- sometimes will help with transfers but is still max assist, sometimes is completely dependent    Prior Function Level of Independence: Needs assistance   Gait / Transfers Assistance Needed: WC for primary mobility, WC and bed bound and max-totalA for transfers  ADL's / Homemaking Assistance Needed: max-total care        Hand Dominance   Dominant Hand: Right    Extremity/Trunk Assessment   Upper Extremity Assessment Upper Extremity Assessment: Defer to OT evaluation    Lower Extremity Assessment Lower Extremity Assessment: Generalized weakness    Cervical / Trunk Assessment Cervical /  Trunk Assessment: Kyphotic  Communication   Communication: No difficulties  Cognition Arousal/Alertness: Awake/alert Behavior During Therapy: Flat affect Overall Cognitive Status: Impaired/Different from baseline Area of Impairment: Problem solving;Safety/judgement;Awareness                         Safety/Judgement: Decreased awareness of safety;Decreased awareness of deficits Awareness: Intellectual Problem Solving: Slow processing;Decreased initiation;Difficulty sequencing;Requires verbal cues;Requires tactile  cues General Comments: slow processing but very pleasant. Cognition appears very similar to last admission      General Comments General comments (skin integrity, edema, etc.): unable to get to EOB today to assess balance    Exercises     Assessment/Plan    PT Assessment Patient needs continued PT services  PT Problem List Decreased strength;Decreased cognition;Decreased knowledge of use of DME;Decreased activity tolerance;Decreased safety awareness;Decreased balance;Decreased mobility;Decreased coordination       PT Treatment Interventions DME instruction;Balance training;Gait training;Cognitive remediation;Functional mobility training;Patient/family education;Therapeutic activities;Therapeutic exercise;Wheelchair mobility training    PT Goals (Current goals can be found in the Care Plan section)  Acute Rehab PT Goals Patient Stated Goal: return home/avoid SNF PT Goal Formulation: With patient/family Time For Goal Achievement: 12/07/20 Potential to Achieve Goals: Good    Frequency Min 3X/week   Barriers to discharge        Co-evaluation               AM-PAC PT "6 Clicks" Mobility  Outcome Measure Help needed turning from your back to your side while in a flat bed without using bedrails?: A Lot Help needed moving from lying on your back to sitting on the side of a flat bed without using bedrails?: Total Help needed moving to and from a bed to a chair (including a wheelchair)?: Total Help needed standing up from a chair using your arms (e.g., wheelchair or bedside chair)?: Total Help needed to walk in hospital room?: Total Help needed climbing 3-5 steps with a railing? : Total 6 Click Score: 7    End of Session   Activity Tolerance: Patient tolerated treatment well Patient left: in bed;with call bell/phone within reach;with bed alarm set Nurse Communication: Mobility status PT Visit Diagnosis: Unsteadiness on feet (R26.81);Difficulty in walking, not elsewhere  classified (R26.2);Other abnormalities of gait and mobility (R26.89);Muscle weakness (generalized) (M62.81)    Time: 7858-8502 PT Time Calculation (min) (ACUTE ONLY): 17 min   Charges:   PT Evaluation $PT Eval Moderate Complexity: 1 Mod         Windell Norfolk, DPT, PN1   Supplemental Physical Therapist [place]    Pager [phone] Acute Rehab Office [phone]

## 2020-11-23 NOTE — Progress Notes (Signed)
Orthopedic Tech Progress Note Patient Details:  Rhonda [name] 1961-04-12 [id number] Bilateral prafos have been ordered from [facility]  Patient ID: Rhonda [name], female   DOB: 04-14-1961, 60 y.o.   MRN: [id number]  [name] 11/23/2020, 4:01 PM

## 2020-11-23 NOTE — Progress Notes (Signed)
Lower extremity venous has been completed.   Preliminary results in CV Proc.   Rhonda [name] 11/23/2020 11:40 AM

## 2020-11-23 NOTE — Progress Notes (Signed)
Hypoglycemic Event  CBG: 60  Treatment: breakfast  Symptoms: None  Follow-up CBG: Time:825 CBG Result:70  Possible Reasons for Event: Inadequate meal intake  Comments/MD notified:Dr. Candiss Norse notified.    Rhonda [name]

## 2020-11-23 NOTE — Progress Notes (Addendum)
PROGRESS NOTE                                                                                                                                                                                                             Patient Demographics:    Rhonda [name], is a 60 y.o. female, DOB - 07-Sep-1960, HER:[id number]  Outpatient Primary MD for the patient is [place] date - 11/21/2020    Chief Complaint  Patient presents with   GI Problem       Brief Narrative (HPI from H&P)  Rhonda [name] is a 60 y.o. female with medical history significant for perforated diverticulitis status post partial colectomy, colostomy, Hartmann procedure and angioembolization of the IMA by IR in April 2022, neurosarcoidosis, pulmonary embolism on Eliquis who presented to [facility] ED due to noted melena then later hematochezia from colostomy bag.   Subjective:   Patient in bed, appears comfortable, denies any headache, no fever, no chest pain or pressure, no shortness of breath , no abdominal pain. No new focal weakness.   Assessment  & Plan :     Acute LGI bleed with acute blood loss related anemia. She has had history of lower GI bleed in the past requiring embolization and eventually colectomy and colostomy.  She was also on Eliquis.  Eliquis has been reversed in the ER, H&H shows some drop in hemoglobin levels requiring 2 units of packed RBC transfusion on 11/23/2020, this could be old bleeding, continue to monitor H&H post transfusion. GI IR and CCS on board.  Continue to monitor clinically, clear liquids and IV PPI. On Zosyn since admission, do not think she requires Zosyn for GI purposes but will continue it for cellulitis.  2.  Left foot cellulitis present on admission.  Patient hit her left foot on the door, on IV vancomycin >> Doxy on 11/23/20 + Zosyn , MRSA nasal PCR negative, monitor closely no abscess yet, mild  improvement.  3.  Asymptomatic transaminitis with hyperbilirubinemia.  Blood pressure stable, patient is asymptomatic, was on high intensity statin which will be held, monitor numbers.  4.  Hypertension.  Placed on home dose beta-blocker and monitor.  5.  History of neurosarcoidosis.  On chronic steroids, continue IV Decadron for now.  6.  History of PE.  Was on Eliquis which is on hold due to ongoing GI bleed.  Monitor closely.  Depending on clinical course may require IVC filter, will check Leg [place].  7.  Mildly elevated troponin caused by demand mismatch from anemia, chest pain-free, EKG nonacute, no further work-up needed.  8.  Hypokalemia.  Replaced.  Obesity: Follow with PCP Estimated body mass index is 31.71 kg/m as calculated from the following:   Height as of this encounter: 5\' 3"  (1.6 m).   Weight as of this encounter: 81.2 kg.          Condition - Guarded  Family Communication  :  none  Code Status :  Full  Consults  :  GI,CCS, IR  PUD Prophylaxis : PPI   Procedures  :     CT - Intraluminal extravasation of contrast just proximal to the left mid abdominal colostomy as it exits the rectus sheath, reflecting active GI bleeding, possibly diverticular in etiology. Direct endoscopic visualization via the colostomy is suggested. NON-VASCULAR Status post left hemicolectomy with left lower quadrant colostomy. Additional ancillary findings as above      Disposition Plan  :    Status is: Inpatient  Remains inpatient appropriate because:IV treatments appropriate due to intensity of illness or inability to take PO  Dispo: The patient is from: Home              Anticipated d/c is to: Home              Patient currently is not medically stable to d/c.   Difficult to place patient No   DVT Prophylaxis  :    SCDs Start: 11/22/20 0258   Lab Results  Component Value Date   PLT 102 (L) 11/23/2020    Diet :  Diet Order             Diet clear liquid Room service  appropriate? Yes; Fluid consistency: Thin  Diet effective now                    Inpatient Medications  Scheduled Meds:  B-complex with vitamin C  1 tablet Oral Daily   dexamethasone (DECADRON) injection  4 mg Intravenous Daily   furosemide  20 mg Intravenous Once   gabapentin  100 mg Oral TID   insulin aspart  0-9 Units Subcutaneous TID WC   metoprolol tartrate  25 mg Oral BID   pantoprazole  40 mg Oral Q0600   Continuous Infusions:  piperacillin-tazobactam (ZOSYN)  IV 3.375 g (11/23/20 0752)   vancomycin 1,250 mg (11/23/20 0511)   PRN Meds:.diphenhydrAMINE, hydrALAZINE, metoprolol tartrate, polyvinyl alcohol  Antibiotics  :    Anti-infectives (From admission, onward)    Start     Dose/Rate Route Frequency Ordered Stop   11/23/20 0600  vancomycin (VANCOREADY) IVPB 1250 mg/250 mL        1,250 mg 166.7 mL/hr over 90 Minutes Intravenous Every 24 hours 11/22/20 0354     11/22/20 1400  piperacillin-tazobactam (ZOSYN) IVPB 3.375 g        3.375 g 12.5 mL/hr over 240 Minutes Intravenous Every 8 hours 11/22/20 0343     11/22/20 0345  vancomycin (VANCOREADY) IVPB 1500 mg/300 mL        1,500 mg 150 mL/hr over 120 Minutes Intravenous  Once 11/22/20 0343 11/22/20 0820   11/22/20 0345  piperacillin-tazobactam (ZOSYN) IVPB 3.375 g        3.375 g 100 mL/hr over 30 Minutes Intravenous  Once 11/22/20  7124 11/22/20 5809       Time Spent in minutes  30   Lala Lund M.D on 11/23/2020 at 10:31 AM  To page go to [email or web address]   [facility] -  Office  [phone]   See all Orders from today for further details    Objective:   Vitals:   11/23/20 0300 11/23/20 0400 11/23/20 0801 11/23/20 0828  BP:  123/90 126/82 119/76  Pulse: 83 89 (!) 106 97  Resp: 16 15 17 13   Temp:  98.1 F (36.7 C) 98 F (36.7 C) 98.5 F (36.9 C)  TempSrc:  Axillary Oral Oral  SpO2: 100% 100% 100% 100%  Weight:      Height:        Wt Readings from Last 3 Encounters:  11/22/20 81.2  kg  09/28/20 92.2 kg  08/06/20 79.4 kg     Intake/Output Summary (Last 24 hours) at 11/23/2020 1031 Last data filed at 11/23/2020 0900 Gross per 24 hour  Intake 2833.69 ml  Output 2400 ml  Net 433.69 ml     Physical Exam  Awake Alert, No new F.N deficits, Normal affect [place].AT,PERRAL Supple Neck,No JVD, No cervical lymphadenopathy appriciated.  Symmetrical Chest wall movement, Good air movement bilaterally, CTAB RRR,No Gallops, Rubs or new Murmurs, No Parasternal Heave +ve B.Sounds, Abd Soft, No tenderness, No organomegaly appriciated, No rebound - guarding or rigidity.  L. Foot red and warm, colostomy bag with stool     Data Review:    CBC Recent Labs  Lab 11/22/20 0826 11/22/20 1644 11/22/20 1948 11/23/20 0235 11/23/20 0713  WBC 8.5 9.6 7.0 7.3 7.2  HGB 8.3* 7.6* 7.9* 7.0* 7.1*  HCT 27.5* 24.3* 25.5* 22.3* 22.3*  PLT 150 136* 132* 123* 102*  MCV 92.3 91.0 92.4 89.9 90.7  MCH 27.9 28.5 28.6 28.2 28.9  MCHC 30.2 31.3 31.0 31.4 31.8  RDW 16.7* 17.0* 17.1* 17.0* 17.2*  LYMPHSABS 0.5* 0.2* 0.3* 0.3* 0.5*  MONOABS 0.3 0.3 0.3 0.2 0.2  EOSABS 0.0 0.0 0.0 0.0 0.0  BASOSABS 0.0 0.0 0.0 0.0 0.0    Recent Labs  Lab 11/21/20 0000 11/22/20 0345 11/22/20 0402 11/22/20 0403 11/22/20 0641 11/22/20 1644 11/23/20 0235  NA 131*  --  134*  --   --   --  134*  K 3.5  --  3.4*  --   --   --  3.5  CL 93*  --  98  --   --   --  99  CO2 26  --  28  --   --   --  30  GLUCOSE 286*  --  264*  --   --   --  123*  BUN 7  --  5*  --   --   --  <5*  CREATININE 0.78  --  0.66  --   --   --  0.47  CALCIUM 8.9  --  8.8*  --   --   --  8.4*  AST 472*  --  448*  --   --   --  320*  ALT 205*  --  198*  --   --   --  169*  ALKPHOS 790*  --  780*  --   --   --  583*  BILITOT 3.1*  --  3.2*  --   --   --  2.3*  ALBUMIN 2.2*  --  2.1*  --   --   --  1.9*  MG  --  2.2  --   --   --   --  2.0  PROCALCITON  --   --   --   --   --  0.17 0.13  INR  --   --   --  1.2  --   --   --   HGBA1C   --   --   --   --  7.8*  --   --   BNP  --   --   --   --   --   --  160.8*    ------------------------------------------------------------------------------------------------------------------ No results for input(s): CHOL, HDL, LDLCALC, TRIG, CHOLHDL, LDLDIRECT in the last 72 hours.  Lab Results  Component Value Date   HGBA1C 7.8 (H) 11/22/2020   ------------------------------------------------------------------------------------------------------------------ No results for input(s): TSH, T4TOTAL, T3FREE, THYROIDAB in the last 72 hours.  Invalid input(s): FREET3  Cardiac Enzymes No results for input(s): CKMB, TROPONINI, MYOGLOBIN in the last 168 hours.  Invalid input(s): CK ------------------------------------------------------------------------------------------------------------------    Component Value Date/Time   BNP 160.8 (H) 11/23/2020 0235    Micro Results Recent Results (from the past 240 hour(s))  Resp Panel by RT-PCR (Flu A&B, Covid) Nasopharyngeal Swab     Status: None   Collection Time: 11/22/20  2:11 AM   Specimen: Nasopharyngeal Swab; Nasopharyngeal(NP) swabs in vial transport medium  Result Value Ref Range Status   SARS Coronavirus 2 by RT PCR NEGATIVE NEGATIVE Final    Comment: (NOTE) SARS-CoV-2 target nucleic acids are NOT DETECTED.  The SARS-CoV-2 RNA is generally detectable in upper respiratory specimens during the acute phase of infection. The lowest concentration of SARS-CoV-2 viral copies this assay can detect is 138 copies/mL. A negative result does not preclude SARS-Cov-2 infection and should not be used as the sole basis for treatment or other patient management decisions. A negative result may occur with  improper specimen collection/handling, submission of specimen other than nasopharyngeal swab, presence of viral mutation(s) within the areas targeted by this assay, and inadequate number of viral copies(<138 copies/mL). A negative result must  be combined with clinical observations, patient history, and epidemiological information. The expected result is Negative.  Fact Sheet for Patients:  EntrepreneurPulse.com.au  Fact Sheet for Healthcare Providers:  IncredibleEmployment.be  This test is no t yet approved or cleared by the [place] FDA and  has been authorized for detection and/or diagnosis of SARS-CoV-2 by FDA under an Emergency Use Authorization (EUA). This EUA will remain  in effect (meaning this test can be used) for the duration of the COVID-19 declaration under Section 564(b)(1) of the Act, 21 U.S.C.section 360bbb-3(b)(1), unless the authorization is terminated  or revoked sooner.       Influenza A by PCR NEGATIVE NEGATIVE Final   Influenza B by PCR NEGATIVE NEGATIVE Final    Comment: (NOTE) The Xpert Xpress SARS-CoV-2/FLU/RSV plus assay is intended as an aid in the diagnosis of influenza from Nasopharyngeal swab specimens and should not be used as a sole basis for treatment. Nasal washings and aspirates are unacceptable for Xpert Xpress SARS-CoV-2/FLU/RSV testing.  Fact Sheet for Patients: EntrepreneurPulse.com.au  Fact Sheet for Healthcare Providers: IncredibleEmployment.be  This test is not yet approved or cleared by the [place] FDA and has been authorized for detection and/or diagnosis of SARS-CoV-2 by FDA under an Emergency Use Authorization (EUA). This EUA will remain in effect (meaning this test can be used) for the duration of the COVID-19 declaration under Section 564(b)(1) of the Act, 21 U.S.C. section 360bbb-3(b)(1), unless the  authorization is terminated or revoked.  Performed at [facility], [place] [address] [place], [place] [address]   MRSA Next Gen by PCR, Nasal     Status: None   Collection Time: 11/22/20  5:18 AM   Specimen: Nasal Mucosa; Nasal Swab  Result Value Ref Range Status   MRSA by PCR  Next Gen NOT DETECTED NOT DETECTED Final    Comment: (NOTE) The GeneXpert MRSA Assay (FDA approved for NASAL specimens only), is one component of a comprehensive MRSA colonization surveillance program. It is not intended to diagnose MRSA infection nor to guide or monitor treatment for MRSA infections. Test performance is not FDA approved in patients less than 34 years old. Performed at [facility], [place] [address], [place], [place] [address]     Radiology Reports CT Angio Abd/Pel W and/or Wo Contrast  Result Date: 11/22/2020 CLINICAL DATA:  GI bleed, on blood thinners EXAM: CTA ABDOMEN AND PELVIS WITHOUT AND WITH CONTRAST TECHNIQUE: Multidetector CT imaging of the abdomen and pelvis was performed using the standard protocol during bolus administration of intravenous contrast. Multiplanar reconstructed images and MIPs were obtained and reviewed to evaluate the vascular anatomy. CONTRAST:  156mL OMNIPAQUE IOHEXOL 350 MG/ML SOLN COMPARISON:  CT abdomen/pelvis dated 09/26/2020 FINDINGS: VASCULAR Aorta: Patent. No evidence of abdominal aortic aneurysm. Mild atherosclerotic calcifications. Celiac: Patent. SMA: Patent. Renals: Patent bilaterally. IMA: Patent. Inflow: Patent. Proximal Outflow: Patent. Veins: Grossly unremarkable. Review of the MIP images confirms the above findings. NON-VASCULAR Lower chest: Lung bases are clear. Hepatobiliary: Liver is within normal limits. Gallbladder is unremarkable. No intrahepatic or extrahepatic ductal dilatation. Pancreas: Within normal limits. Spleen: Within normal limits. Adrenals/Urinary Tract: Adrenal glands are within normal limits. Subcentimeter left lower pole renal cyst (series 7/image 71). Right kidney is within normal limits. No hydronephrosis. Bladder is within normal limits. Stomach/Bowel: Stomach is within normal limits. No evidence of bowel obstruction. Appendix is not discretely visualized. Status post left hemicolectomy with left lower quadrant  colostomy. Scattered colonic diverticulosis. On the arterial phase, there is possible early enhancement within a diverticulum just proximal to the left mid abdominal colostomy as it exits the rectus sheath (series 10/image 111), equivocal. However, on venous phase, there is clear intraluminal extravasation of contrast in this location (series 17/image 109), confirming active GI bleeding in this location. Lymphatic: No suspicious abdominopelvic lymphadenopathy. Reproductive: Status post hysterectomy. Bilateral ovaries are within normal limits. Other: No abdominopelvic ascites. Musculoskeletal: Benign intramuscular lipoma in the left gluteal musculature (series 10/image 153). Degenerative changes of the visualized thoracolumbar spine. IMPRESSION: VASCULAR Intraluminal extravasation of contrast just proximal to the left mid abdominal colostomy as it exits the rectus sheath, reflecting active GI bleeding, possibly diverticular in etiology. Direct endoscopic visualization via the colostomy is suggested. NON-VASCULAR Status post left hemicolectomy with left lower quadrant colostomy. Additional ancillary findings as above. Electronically Signed   By: Julian Hy M.D.   On: 11/22/2020 01:39

## 2020-11-23 NOTE — Progress Notes (Addendum)
Daily Rounding Note  11/23/2020, 10:04 AM  LOS: 1 day   SUBJECTIVE:   Chief complaint:  Bleeding per ostomy.  Elevated LFTs.      Bleeding per ostomy has ceased.  BM now dark brown, soft, greenish.  Pt hungry on clears.  No abdominal pain.  Feels tired, exhausted.   Getting PRBC transfusion currently.  BPs 120s/70s to 90.  HR mid 80s to 108.    OBJECTIVE:         Vital signs in last 24 hours:    Temp:  [97.5 F (36.4 C)-98.5 F (36.9 C)] 98.5 F (36.9 C) (07/08 0828) Pulse Rate:  [50-121] 97 (07/08 0828) Resp:  [13-25] 13 (07/08 0828) BP: (119-152)/(73-126) 119/76 (07/08 0828) SpO2:  [90 %-100 %] 100 % (07/08 0828) Weight:  [81.2 kg] 81.2 kg (07/07 1633) Last BM Date: 11/22/20 Filed Weights   11/22/20 0114 11/22/20 1633  Weight: 92.2 kg 81.2 kg   General: Cushingoid, chronically ill looking.  Resting comfortably.     Heart: Irreg, Irreg, rate in 80s.   Chest: clear in front, no labored breathing.  No cough Abdomen: soft, NT.  Deep brown, greenish, soft stool in ostomy bag.  No blood visible  Extremities: + LE edema.  Purple changes and increased warmth at top of L foot.    Neuro/Psych:  affect bland.  Cooperative.  Awakens easily and maintains arousal.  No tremors.  Appropriate w/O obvious confusion.    Intake/Output from previous day: 07/07 0701 - 07/08 0700 In: 2833.7 [P.O.:1280; I.V.:1507.4; IV Piggyback:46.3] Out: 1550 [Urine:1500; Stool:50]  Intake/Output this shift: Total I/O In: -  Out: 850 [Urine:850]  Lab Results: Recent Labs    11/22/20 1948 11/23/20 0235 11/23/20 0713  WBC 7.0 7.3 7.2  HGB 7.9* 7.0* 7.1*  HCT 25.5* 22.3* 22.3*  PLT 132* 123* 102*   BMET Recent Labs    11/21/20 0000 11/22/20 0402 11/23/20 0235  NA 131* 134* 134*  K 3.5 3.4* 3.5  CL 93* 98 99  CO2 26 28 30   GLUCOSE 286* 264* 123*  BUN 7 5* <5*  CREATININE 0.78 0.66 0.47  CALCIUM 8.9 8.8* 8.4*   LFT Recent Labs     11/21/20 0000 11/22/20 0402 11/23/20 0235  PROT 4.4* 4.6* 4.0*  ALBUMIN 2.2* 2.1* 1.9*  AST 472* 448* 320*  ALT 205* 198* 169*  ALKPHOS 790* 780* 583*  BILITOT 3.1* 3.2* 2.3*   PT/INR Recent Labs    11/22/20 0403  LABPROT 14.7  INR 1.2   Hepatitis Panel Recent Labs    11/22/20 0402  HEPBSAG NON REACTIVE  HCVAB NON REACTIVE  HEPAIGM NON REACTIVE  HEPBIGM NON REACTIVE    Studies/Results: CT Angio Abd/Pel W and/or Wo Contrast  Result Date: 11/22/2020 CLINICAL DATA:  GI bleed, on blood thinners EXAM: CTA ABDOMEN AND PELVIS WITHOUT AND WITH CONTRAST TECHNIQUE: Multidetector CT imaging of the abdomen and pelvis was performed using the standard protocol during bolus administration of intravenous contrast. Multiplanar reconstructed images and MIPs were obtained and reviewed to evaluate the vascular anatomy. CONTRAST:  127mL OMNIPAQUE IOHEXOL 350 MG/ML SOLN COMPARISON:  CT abdomen/pelvis dated 09/26/2020 FINDINGS: VASCULAR Aorta: Patent. No evidence of abdominal aortic aneurysm. Mild atherosclerotic calcifications. Celiac: Patent. SMA: Patent. Renals: Patent bilaterally. IMA: Patent. Inflow: Patent. Proximal Outflow: Patent. Veins: Grossly unremarkable. Review of the MIP images confirms the above findings. NON-VASCULAR Lower chest: Lung bases are clear. Hepatobiliary: Liver is within normal limits. Gallbladder  is unremarkable. No intrahepatic or extrahepatic ductal dilatation. Pancreas: Within normal limits. Spleen: Within normal limits. Adrenals/Urinary Tract: Adrenal glands are within normal limits. Subcentimeter left lower pole renal cyst (series 7/image 71). Right kidney is within normal limits. No hydronephrosis. Bladder is within normal limits. Stomach/Bowel: Stomach is within normal limits. No evidence of bowel obstruction. Appendix is not discretely visualized. Status post left hemicolectomy with left lower quadrant colostomy. Scattered colonic diverticulosis. On the arterial phase,  there is possible early enhancement within a diverticulum just proximal to the left mid abdominal colostomy as it exits the rectus sheath (series 10/image 111), equivocal. However, on venous phase, there is clear intraluminal extravasation of contrast in this location (series 17/image 109), confirming active GI bleeding in this location. Lymphatic: No suspicious abdominopelvic lymphadenopathy. Reproductive: Status post hysterectomy. Bilateral ovaries are within normal limits. Other: No abdominopelvic ascites. Musculoskeletal: Benign intramuscular lipoma in the left gluteal musculature (series 10/image 153). Degenerative changes of the visualized thoracolumbar spine. IMPRESSION: VASCULAR Intraluminal extravasation of contrast just proximal to the left mid abdominal colostomy as it exits the rectus sheath, reflecting active GI bleeding, possibly diverticular in etiology. Direct endoscopic visualization via the colostomy is suggested. NON-VASCULAR Status post left hemicolectomy with left lower quadrant colostomy. Additional ancillary findings as above. Electronically Signed   By: Julian Hy M.D.   On: 11/22/2020 01:39    Scheduled Meds:  B-complex with vitamin C  1 tablet Oral Daily   dexamethasone (DECADRON) injection  4 mg Intravenous Daily   furosemide  20 mg Intravenous Once   gabapentin  100 mg Oral TID   insulin aspart  0-9 Units Subcutaneous Q4H   metoprolol tartrate  25 mg Oral BID   pantoprazole  40 mg Oral Q0600   Continuous Infusions:  piperacillin-tazobactam (ZOSYN)  IV 3.375 g (11/23/20 0752)   vancomycin 1,250 mg (11/23/20 0511)   PRN Meds:.diphenhydrAMINE, hydrALAZINE, metoprolol tartrate, polyvinyl alcohol   ASSESMENT:   Painless bleeding per ostomy.  Resolved this AM.  CT angio confirms recurrent diverticular bleed.  Previous diverticular bleeds in March and April 2022.  07/2020 EGD, colonoscopy.  08/23/2020 VCE.  08/29/2020 coil embolization branch of superior sigmoid  artery.    Acute on chronic anemia.  Hgb 8.3 >> 7.1.  previous range post laparoscopy: 7.6 - 9. Getting 1 of 1 PRBCs now.     Multiple PE, initiated Eliquis April/May 2022.  Eliquis on hold.  Reversed w K centra 7/7   Acute elevation of LFTs, mild Lipase elevation: asymptomatic.  Pancreas, gallbladder, biliary tree unremarkable per CT scan.  Acute hepatitis serologies nonreactive.  Suspect drug reaction.  BPs hypertensive at arrival, so hepatic hypoperfusion/shock liver less likely.  Multiple ancillary labs ordered including smooth muscle antibody, ANA, IgG, alpha-1 antitrypsin, ceruloplasmin.  LFTs all improved over last 2 days.  4/16 sigmoid resection with colostomy for perforated sigmoid diverticulitis.    L LE cellulitis.  Zosyn, Vanc d 2.    Neurosarcoidosis, chronic steroids, multilevel spinal disease, chronic debilitation, bed/wheelchair bound.     Acute thrombocytopenia.  INR wnl at 1.2.      Yet to be worked up left lung mass seen on CT 09/21/2020.    PLAN   Advanced to CM diet.       See that Dr [place] Crane ordered 2 PRBCs.  D/w Dr Candiss Norse and will limit to 1 PRBC.  Recheck CBC 5 PM, 5 AM    Azucena Freed  11/23/2020, 10:04 AM Phone [phone]  GI ATTENDING  Interval history data reviewed.  Patient seen and examined.  Agree with several progress note as outlined above.  Fortunately, GI bleeding is ceased.  Anticoagulants being held.  Liver test abnormalities being worked up currently.  Does this patient need IVC filter orders and to resume anticoagulation therapy?  I will leave this issue to the primary service.  We will continue to follow.  Docia Chuck. Geri Seminole., M.D. [facility] Division of Gastroenterology

## 2020-11-24 DIAGNOSIS — R7401 Elevation of levels of liver transaminase levels: Secondary | ICD-10-CM

## 2020-11-24 LAB — TYPE AND SCREEN
ABO/RH(D): B POS
Antibody Screen: NEGATIVE
Unit division: 0
Unit division: 0

## 2020-11-24 LAB — COMPREHENSIVE METABOLIC PANEL
ALT: 146 U/L — ABNORMAL HIGH (ref 0–44)
AST: 222 U/L — ABNORMAL HIGH (ref 15–41)
Albumin: 1.8 g/dL — ABNORMAL LOW (ref 3.5–5.0)
Alkaline Phosphatase: 565 U/L — ABNORMAL HIGH (ref 38–126)
Anion gap: 6 (ref 5–15)
BUN: 5 mg/dL — ABNORMAL LOW (ref 6–20)
CO2: 29 mmol/L (ref 22–32)
Calcium: 7.8 mg/dL — ABNORMAL LOW (ref 8.9–10.3)
Chloride: 98 mmol/L (ref 98–111)
Creatinine, Ser: 0.55 mg/dL (ref 0.44–1.00)
GFR, Estimated: >60 mL/min (ref >60)
Glucose, Bld: 138 mg/dL — ABNORMAL HIGH (ref 70–99)
Potassium: 2.8 mmol/L — ABNORMAL LOW (ref 3.5–5.1)
Sodium: 133 mmol/L — ABNORMAL LOW (ref 135–145)
Total Bilirubin: 2.7 mg/dL — ABNORMAL HIGH (ref 0.3–1.2)
Total Protein: 4.1 g/dL — ABNORMAL LOW (ref 6.5–8.1)

## 2020-11-24 LAB — BPAM RBC
Blood Product Expiration Date: 202207282359
Blood Product Expiration Date: 202207282359
ISSUE DATE / TIME: 202207080800
ISSUE DATE / TIME: 202207081137
Unit Type and Rh: 7300
Unit Type and Rh: 7300

## 2020-11-24 LAB — BRAIN NATRIURETIC PEPTIDE: B Natriuretic Peptide: 152.6 pg/mL — ABNORMAL HIGH (ref 0.0–100.0)

## 2020-11-24 LAB — GLUCOSE, CAPILLARY
Glucose-Capillary: 170 mg/dL — ABNORMAL HIGH (ref 70–99)
Glucose-Capillary: 96 mg/dL (ref 70–99)
Glucose-Capillary: 141 mg/dL — ABNORMAL HIGH (ref 70–99)
Glucose-Capillary: 178 mg/dL — ABNORMAL HIGH (ref 70–99)
Glucose-Capillary: 155 mg/dL — ABNORMAL HIGH (ref 70–99)

## 2020-11-24 LAB — CBC
HCT: 32.1 % — ABNORMAL LOW (ref 36.0–46.0)
Hemoglobin: 10.4 g/dL — ABNORMAL LOW (ref 12.0–15.0)
MCH: 28.8 pg (ref 26.0–34.0)
MCHC: 32.4 g/dL (ref 30.0–36.0)
MCV: 88.9 fL (ref 80.0–100.0)
Platelets: 96 10*3/uL — ABNORMAL LOW (ref 150–400)
RBC: 3.61 MIL/uL — ABNORMAL LOW (ref 3.87–5.11)
RDW: 16 % — ABNORMAL HIGH (ref 11.5–15.5)
WBC: 6.9 10*3/uL (ref 4.0–10.5)
nRBC: 2 % — ABNORMAL HIGH (ref 0.0–0.2)

## 2020-11-24 LAB — PROCALCITONIN: Procalcitonin: 0.18 ng/mL

## 2020-11-24 LAB — CERULOPLASMIN: Ceruloplasmin: 13.7 mg/dL — ABNORMAL LOW (ref 19.0–39.0)

## 2020-11-24 LAB — ALPHA-1-ANTITRYPSIN: A-1 Antitrypsin, Ser: 141 mg/dL (ref 101–187)

## 2020-11-24 LAB — MAGNESIUM: Magnesium: 1.8 mg/dL (ref 1.7–2.4)

## 2020-11-24 MED ORDER — POTASSIUM CHLORIDE CRYS ER 20 MEQ PO TBCR
40.0000 meq | EXTENDED_RELEASE_TABLET | Freq: Four times a day (QID) | ORAL | Status: AC
  Administered 2020-11-24 (×2): 40 meq via ORAL
  Filled 2020-11-24 (×2): qty 2

## 2020-11-24 NOTE — Progress Notes (Addendum)
PROGRESS NOTE                                                                                                                                                                                                             Patient Demographics:    Rhonda [name], is a 60 y.o. female, DOB - [birth year], FHL:[id number]  Outpatient Primary MD for the patient is [place] date - 11/21/2020    Chief Complaint  Patient presents with   GI Problem       Brief Narrative (HPI from H&P)  Rhonda [name] is a 60 y.o. female with medical history significant for perforated diverticulitis status post partial colectomy, colostomy, Hartmann procedure and angioembolization of the IMA by IR in April 2022, neurosarcoidosis, pulmonary embolism on Eliquis who presented to [facility] ED due to noted melena then later hematochezia from colostomy bag.   Subjective:   Patient in bed, appears comfortable, denies any headache, no fever, no chest pain or pressure, no shortness of breath , no abdominal pain. No new focal weakness.    Assessment  & Plan :     Acute LGI bleed with acute blood loss related anemia. She has had history of lower GI bleed in the past requiring embolization and eventually colectomy and colostomy.  She was also on Eliquis.  Eliquis has been reversed in the ER, H&H shows some drop in hemoglobin levels requiring 2 units of packed RBC transfusion on 11/23/2020, this could be old bleeding, continue to monitor H&H post transfusion. GI IR and CCS on board.  Continue to monitor clinically, clear liquids and IV PPI. On Zosyn since admission, do not think she requires Zosyn for GI purposes but will continue it for cellulitis.  2.  Left foot cellulitis present on admission.  Patient hit her left foot on the door, on IV vancomycin >> Doxy on 11/23/20 + Zosyn , MRSA nasal PCR negative, monitor closely no abscess yet, continued  improvement.  3.  Asymptomatic transaminitis with hyperbilirubinemia.  Blood pressure stable, patient is asymptomatic, was on high intensity statin which will be held, monitor numbers. Defer workup to the GI team.  4.  Hypertension.  Placed on home dose beta-blocker and monitor.  5.  History of neurosarcoidosis.  On chronic steroids, continue IV Decadron for  now.  6.  History of PE (09/21/20) .  Was on Eliquis which is on hold due to ongoing GI bleed.  Monitor closely.  Depending on clinical course may require IVC filter, negative Leg [place].  7.  Mildly elevated troponin caused by demand mismatch from anemia, chest pain-free, EKG nonacute, no further work-up needed.  8.  Hypokalemia.  Replaced aggresively.  Obesity: Follow with PCP Estimated body mass index is 31.71 kg/m as calculated from the following:   Height as of this encounter: 5\' 3"  (1.6 m).   Weight as of this encounter: 81.2 kg.          Condition - Guarded  Family Communication  :  husband [name] [phone] 11/24/20  Code Status :  Full  Consults  :  GI,CCS, IR  PUD Prophylaxis : PPI   Procedures  :     Leg [place] - No DVT  CT - Intraluminal extravasation of contrast just proximal to the left mid abdominal colostomy as it exits the rectus sheath, reflecting active GI bleeding, possibly diverticular in etiology. Direct endoscopic visualization via the colostomy is suggested. NON-VASCULAR Status post left hemicolectomy with left lower quadrant colostomy. Additional ancillary findings as above      Disposition Plan  :    Status is: Inpatient  Remains inpatient appropriate because:IV treatments appropriate due to intensity of illness or inability to take PO  Dispo: The patient is from: Home              Anticipated d/c is to: Home              Patient currently is not medically stable to d/c.   Difficult to place patient No   DVT Prophylaxis  :    SCDs Start: 11/22/20 0258   Lab Results  Component Value Date    PLT 96 (L) 11/24/2020    Diet :  Diet Order             Diet Carb Modified Fluid consistency: Thin; Room service appropriate? Yes  Diet effective now                    Inpatient Medications  Scheduled Meds:  B-complex with vitamin C  1 tablet Oral Daily   dexamethasone (DECADRON) injection  4 mg Intravenous Daily   gabapentin  100 mg Oral TID   insulin aspart  0-9 Units Subcutaneous TID WC   metoprolol tartrate  25 mg Oral BID   pantoprazole  40 mg Oral Q0600   potassium chloride  40 mEq Oral Q6H   Continuous Infusions:  doxycycline (VIBRAMYCIN) IV 100 mg (11/23/20 2350)   piperacillin-tazobactam (ZOSYN)  IV 3.375 g (11/24/20 0543)   PRN Meds:.hydrALAZINE, metoprolol tartrate, polyvinyl alcohol  Antibiotics  :    Anti-infectives (From admission, onward)    Start     Dose/Rate Route Frequency Ordered Stop   11/23/20 1200  doxycycline (VIBRAMYCIN) 100 mg in sodium chloride 0.9 % 250 mL IVPB        100 mg 125 mL/hr over 120 Minutes Intravenous Every 12 hours 11/23/20 1045     11/23/20 0600  vancomycin (VANCOREADY) IVPB 1250 mg/250 mL  Status:  Discontinued        1,250 mg 166.7 mL/hr over 90 Minutes Intravenous Every 24 hours 11/22/20 0354 11/23/20 1044   11/22/20 1400  piperacillin-tazobactam (ZOSYN) IVPB 3.375 g        3.375 g 12.5 mL/hr over 240 Minutes Intravenous Every 8 hours  11/22/20 0343     11/22/20 0345  vancomycin (VANCOREADY) IVPB 1500 mg/300 mL        1,500 mg 150 mL/hr over 120 Minutes Intravenous  Once 11/22/20 0343 11/22/20 0820   11/22/20 0345  piperacillin-tazobactam (ZOSYN) IVPB 3.375 g        3.375 g 100 mL/hr over 30 Minutes Intravenous  Once 11/22/20 0343 11/22/20 3810       Time Spent in minutes  30   Lala Lund M.D on 11/24/2020 at 8:53 AM  To page go to [email or web address]   [facility] -  Office  [phone]   See all Orders from today for further details    Objective:   Vitals:   11/23/20 2330 11/24/20 0300 11/24/20  0333 11/24/20 0750  BP: (!) 146/95  116/66 136/81  Pulse: 86  83 (!) 103  Resp: 18 (P) 18 15 17   Temp: 98.6 F (37 C)  98 F (36.7 C) 98.8 F (37.1 C)  TempSrc: Oral  Oral Oral  SpO2: 98%  100% 100%  Weight:      Height:        Wt Readings from Last 3 Encounters:  11/22/20 81.2 kg  09/28/20 92.2 kg  08/06/20 79.4 kg     Intake/Output Summary (Last 24 hours) at 11/24/2020 0853 Last data filed at 11/24/2020 0555 Gross per 24 hour  Intake 3055.28 ml  Output 4950 ml  Net -1894.72 ml     Physical Exam  Awake Alert, No new F.N deficits, Normal affect [place].AT,PERRAL Supple Neck,No JVD, No cervical lymphadenopathy appriciated.  Symmetrical Chest wall movement, Good air movement bilaterally, CTAB RRR,No Gallops, Rubs or new Murmurs, No Parasternal Heave +ve B.Sounds, Abd Soft, No tenderness, No organomegaly appriciated, No rebound - guarding or rigidity.  L. Foot red and warm, colostomy bag with stool     Data Review:    CBC Recent Labs  Lab 11/22/20 0826 11/22/20 1644 11/22/20 1948 11/23/20 0235 11/23/20 0713 11/23/20 1805 11/24/20 0450  WBC 8.5 9.6 7.0 7.3 7.2 7.7 6.9  HGB 8.3* 7.6* 7.9* 7.0* 7.1* 10.6* 10.4*  HCT 27.5* 24.3* 25.5* 22.3* 22.3* 31.6* 32.1*  PLT 150 136* 132* 123* 102* 98* 96*  MCV 92.3 91.0 92.4 89.9 90.7 87.8 88.9  MCH 27.9 28.5 28.6 28.2 28.9 29.4 28.8  MCHC 30.2 31.3 31.0 31.4 31.8 33.5 32.4  RDW 16.7* 17.0* 17.1* 17.0* 17.2* 15.4 16.0*  LYMPHSABS 0.5* 0.2* 0.3* 0.3* 0.5*  --   --   MONOABS 0.3 0.3 0.3 0.2 0.2  --   --   EOSABS 0.0 0.0 0.0 0.0 0.0  --   --   BASOSABS 0.0 0.0 0.0 0.0 0.0  --   --     Recent Labs  Lab 11/21/20 0000 11/22/20 0345 11/22/20 0402 11/22/20 0403 11/22/20 0641 11/22/20 1644 11/23/20 0235 11/24/20 0450  NA 131*  --  134*  --   --   --  134* 133*  K 3.5  --  3.4*  --   --   --  3.5 2.8*  CL 93*  --  98  --   --   --  99 98  CO2 26  --  28  --   --   --  30 29  GLUCOSE 286*  --  264*  --   --   --  123* 138*   BUN 7  --  5*  --   --   --  <5* 5*  CREATININE  0.78  --  0.66  --   --   --  0.47 0.55  CALCIUM 8.9  --  8.8*  --   --   --  8.4* 7.8*  AST 472*  --  448*  --   --   --  320* 222*  ALT 205*  --  198*  --   --   --  169* 146*  ALKPHOS 790*  --  780*  --   --   --  583* 565*  BILITOT 3.1*  --  3.2*  --   --   --  2.3* 2.7*  ALBUMIN 2.2*  --  2.1*  --   --   --  1.9* 1.8*  MG  --  2.2  --   --   --   --  2.0 1.8  PROCALCITON  --   --   --   --   --  0.17 0.13 0.18  INR  --   --   --  1.2  --   --   --   --   HGBA1C  --   --   --   --  7.8*  --   --   --   BNP  --   --   --   --   --   --  160.8* 152.6*    ------------------------------------------------------------------------------------------------------------------ No results for input(s): CHOL, HDL, LDLCALC, TRIG, CHOLHDL, LDLDIRECT in the last 72 hours.  Lab Results  Component Value Date   HGBA1C 7.8 (H) 11/22/2020   ------------------------------------------------------------------------------------------------------------------ No results for input(s): TSH, T4TOTAL, T3FREE, THYROIDAB in the last 72 hours.  Invalid input(s): FREET3  Cardiac Enzymes No results for input(s): CKMB, TROPONINI, MYOGLOBIN in the last 168 hours.  Invalid input(s): CK ------------------------------------------------------------------------------------------------------------------    Component Value Date/Time   BNP 152.6 (H) 11/24/2020 0450    Micro Results Recent Results (from the past 240 hour(s))  Resp Panel by RT-PCR (Flu A&B, Covid) Nasopharyngeal Swab     Status: None   Collection Time: 11/22/20  2:11 AM   Specimen: Nasopharyngeal Swab; Nasopharyngeal(NP) swabs in vial transport medium  Result Value Ref Range Status   SARS Coronavirus 2 by RT PCR NEGATIVE NEGATIVE Final    Comment: (NOTE) SARS-CoV-2 target nucleic acids are NOT DETECTED.  The SARS-CoV-2 RNA is generally detectable in upper respiratory specimens during the acute  phase of infection. The lowest concentration of SARS-CoV-2 viral copies this assay can detect is 138 copies/mL. A negative result does not preclude SARS-Cov-2 infection and should not be used as the sole basis for treatment or other patient management decisions. A negative result may occur with  improper specimen collection/handling, submission of specimen other than nasopharyngeal swab, presence of viral mutation(s) within the areas targeted by this assay, and inadequate number of viral copies(<138 copies/mL). A negative result must be combined with clinical observations, patient history, and epidemiological information. The expected result is Negative.  Fact Sheet for Patients:  EntrepreneurPulse.com.au  Fact Sheet for Healthcare Providers:  IncredibleEmployment.be  This test is no t yet approved or cleared by the [place] FDA and  has been authorized for detection and/or diagnosis of SARS-CoV-2 by FDA under an Emergency Use Authorization (EUA). This EUA will remain  in effect (meaning this test can be used) for the duration of the COVID-19 declaration under Section 564(b)(1) of the Act, 21 U.S.C.section 360bbb-3(b)(1), unless the authorization is terminated  or revoked sooner.       Influenza A  by PCR NEGATIVE NEGATIVE Final   Influenza B by PCR NEGATIVE NEGATIVE Final    Comment: (NOTE) The Xpert Xpress SARS-CoV-2/FLU/RSV plus assay is intended as an aid in the diagnosis of influenza from Nasopharyngeal swab specimens and should not be used as a sole basis for treatment. Nasal washings and aspirates are unacceptable for Xpert Xpress SARS-CoV-2/FLU/RSV testing.  Fact Sheet for Patients: EntrepreneurPulse.com.au  Fact Sheet for Healthcare Providers: IncredibleEmployment.be  This test is not yet approved or cleared by the [place] FDA and has been authorized for detection and/or diagnosis of  SARS-CoV-2 by FDA under an Emergency Use Authorization (EUA). This EUA will remain in effect (meaning this test can be used) for the duration of the COVID-19 declaration under Section 564(b)(1) of the Act, 21 U.S.C. section 360bbb-3(b)(1), unless the authorization is terminated or revoked.  Performed at [facility], [place] [address], [place], [place] [address]   MRSA Next Gen by PCR, Nasal     Status: None   Collection Time: 11/22/20  5:18 AM   Specimen: Nasal Mucosa; Nasal Swab  Result Value Ref Range Status   MRSA by PCR Next Gen NOT DETECTED NOT DETECTED Final    Comment: (NOTE) The GeneXpert MRSA Assay (FDA approved for NASAL specimens only), is one component of a comprehensive MRSA colonization surveillance program. It is not intended to diagnose MRSA infection nor to guide or monitor treatment for MRSA infections. Test performance is not FDA approved in patients less than 25 years old. Performed at [facility], [place] [address] [place], [place] [address]     Radiology Reports VAS [place] LOWER EXTREMITY VENOUS (DVT)  Result Date: 11/23/2020  Lower Venous DVT Study Patient Name:  KEISHIA GROUND [facility]  Date of Exam:   11/23/2020 Medical Rec #: [id number]       Accession #:    [id number] Date of Birth: 1960-12-12       Patient Gender: F Patient Age:   42Y Exam Location:  [facility] Procedure:      VAS [place] LOWER EXTREMITY VENOUS (DVT) Referring Phys: [id number] Mackintosh [facility] --------------------------------------------------------------------------------  Indications: Swelling, Edema, and elevated ddimer.  Comparison Study: 09/21/20 prior Performing Technologist: Archie Patten RVS  Examination Guidelines: A complete evaluation includes B-mode imaging, spectral Doppler, color Doppler, and power Doppler as needed of all accessible portions of each vessel. Bilateral testing is considered an integral part of a complete examination. Limited examinations for reoccurring indications may be  performed as noted. The reflux portion of the exam is performed with the patient in reverse Trendelenburg.  +---------+---------------+---------+-----------+----------+-------------------+ RIGHT    CompressibilityPhasicitySpontaneityPropertiesThrombus Aging      +---------+---------------+---------+-----------+----------+-------------------+ CFV      Full           Yes      Yes                                      +---------+---------------+---------+-----------+----------+-------------------+ SFJ      Full                                                             +---------+---------------+---------+-----------+----------+-------------------+ FV Prox  Full                                                             +---------+---------------+---------+-----------+----------+-------------------+  FV Mid   Full                                                             +---------+---------------+---------+-----------+----------+-------------------+ FV DistalFull                                                             +---------+---------------+---------+-----------+----------+-------------------+ PFV      Full                                                             +---------+---------------+---------+-----------+----------+-------------------+ POP      Full           Yes      Yes                                      +---------+---------------+---------+-----------+----------+-------------------+ PTV      Full                                                             +---------+---------------+---------+-----------+----------+-------------------+ PERO                                                  Not well visualized +---------+---------------+---------+-----------+----------+-------------------+   +---------+---------------+---------+-----------+----------+-------------------+ LEFT      CompressibilityPhasicitySpontaneityPropertiesThrombus Aging      +---------+---------------+---------+-----------+----------+-------------------+ CFV      Full           Yes      Yes                                      +---------+---------------+---------+-----------+----------+-------------------+ SFJ      Full                                                             +---------+---------------+---------+-----------+----------+-------------------+ FV Prox  Full                                                             +---------+---------------+---------+-----------+----------+-------------------+ FV Mid   Full                                                             +---------+---------------+---------+-----------+----------+-------------------+  FV DistalFull                                                             +---------+---------------+---------+-----------+----------+-------------------+ PFV      Full                                                             +---------+---------------+---------+-----------+----------+-------------------+ POP      Full           Yes      Yes                                      +---------+---------------+---------+-----------+----------+-------------------+ PTV      Full                                                             +---------+---------------+---------+-----------+----------+-------------------+ PERO                                                  Not well visualized +---------+---------------+---------+-----------+----------+-------------------+     Summary: BILATERAL: - No evidence of deep vein thrombosis seen in the lower extremities, bilaterally. -No evidence of popliteal cyst, bilaterally.   *See table(s) above for measurements and observations.    Preliminary    CT Angio Abd/Pel W and/or Wo Contrast  Result Date: 11/22/2020 CLINICAL DATA:  GI bleed, on blood thinners  EXAM: CTA ABDOMEN AND PELVIS WITHOUT AND WITH CONTRAST TECHNIQUE: Multidetector CT imaging of the abdomen and pelvis was performed using the standard protocol during bolus administration of intravenous contrast. Multiplanar reconstructed images and MIPs were obtained and reviewed to evaluate the vascular anatomy. CONTRAST:  122mL OMNIPAQUE IOHEXOL 350 MG/ML SOLN COMPARISON:  CT abdomen/pelvis dated 09/26/2020 FINDINGS: VASCULAR Aorta: Patent. No evidence of abdominal aortic aneurysm. Mild atherosclerotic calcifications. Celiac: Patent. SMA: Patent. Renals: Patent bilaterally. IMA: Patent. Inflow: Patent. Proximal Outflow: Patent. Veins: Grossly unremarkable. Review of the MIP images confirms the above findings. NON-VASCULAR Lower chest: Lung bases are clear. Hepatobiliary: Liver is within normal limits. Gallbladder is unremarkable. No intrahepatic or extrahepatic ductal dilatation. Pancreas: Within normal limits. Spleen: Within normal limits. Adrenals/Urinary Tract: Adrenal glands are within normal limits. Subcentimeter left lower pole renal cyst (series 7/image 71). Right kidney is within normal limits. No hydronephrosis. Bladder is within normal limits. Stomach/Bowel: Stomach is within normal limits. No evidence of bowel obstruction. Appendix is not discretely visualized. Status post left hemicolectomy with left lower quadrant colostomy. Scattered colonic diverticulosis. On the arterial phase, there is possible early enhancement within a diverticulum just proximal to the left mid abdominal colostomy as it exits the rectus sheath (series 10/image 111), equivocal. However, on venous phase, there is clear intraluminal extravasation  of contrast in this location (series 17/image 109), confirming active GI bleeding in this location. Lymphatic: No suspicious abdominopelvic lymphadenopathy. Reproductive: Status post hysterectomy. Bilateral ovaries are within normal limits. Other: No abdominopelvic ascites.  Musculoskeletal: Benign intramuscular lipoma in the left gluteal musculature (series 10/image 153). Degenerative changes of the visualized thoracolumbar spine. IMPRESSION: VASCULAR Intraluminal extravasation of contrast just proximal to the left mid abdominal colostomy as it exits the rectus sheath, reflecting active GI bleeding, possibly diverticular in etiology. Direct endoscopic visualization via the colostomy is suggested. NON-VASCULAR Status post left hemicolectomy with left lower quadrant colostomy. Additional ancillary findings as above. Electronically Signed   By: Julian Hy M.D.   On: 11/22/2020 01:39

## 2020-11-24 NOTE — Consult Note (Signed)
WOC Nurse Consult Note: Reason for Consult:Patient known to our service from her previous admission. Has been seen and assessed by [place] Surgeon, Dr. Rosendo Gros on 11/22/20. Orders provided for wound and ostomy care for Nursing staff while in house by this writer. LUQ colostomy to be pouched with 1 piece easily compressible convex pouch, [place] # [phone] and a skin barrier ring used [name] # [phone]).  Guidance for emptying and pouch changing is provided via the orders. Midline incision healing by secondary intention is to be managed while in house with NS dampened gauze changed twice daily. Bedside RN to to measure and document measurements of this wound in the Nursing Flow Sheet section of the EHR (LxWxD in cm).  [facility] nursing team will not follow, but will remain available to this patient, the nursing and medical teams.  Please re-consult if needed. Thanks, Maudie Flakes, MSN, RN, [place], Arther Abbott  Pager# [phone]

## 2020-11-24 NOTE — Progress Notes (Signed)
Physical Therapy Treatment Patient Details Name: Rhonda [name] MRN: [id number] DOB: [birth year] Today's Date: 11/24/2020    History of Present Illness 60yo female admitted 11/21/20 with c/o melena and hematochezia from colostomy bag. Found to have lower and suspected upper GIB, L foot cellulitis. 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  Pt reports her husband normally stands her and transfers her OOB to her WC daily, uses depends for toileting and they can normally get into a car with stand pivot transfer.  PT OT worked together to assess safety of OOB mobility with her husband.  She was able to do a squat pivot transfer OOB to the drop arm recliner chair, but was safest with two person assist.  I could not imagine her husband doing this multiple times per day.  He would be safer to have a lift at home to help with her transfers.  She wants to go home soon and will likely need PTAR to take her safely home when she is ready for d/c.  PT will continue to follow acutely for safe mobility progression    Follow Up Recommendations  Home health PT;Supervision/Assistance - 24 hour     Equipment Recommendations  Hospital bed;Other (comment) (hoyer lift, PTAR transport home)    Recommendations for Other Services       Precautions / Restrictions Precautions Precautions: Fall;Other (comment) Precaution Comments: colostomy bag Restrictions Weight Bearing Restrictions: No    Mobility  Bed Mobility Overal bed mobility: Needs Assistance Bed Mobility: Rolling;Supine to Sit Rolling: Mod assist   Supine to sit: Mod assist;HOB elevated;+2 for safety/equipment;+2 for physical assistance     General bed mobility comments: able to roll toward her left wtih modA, required modA for BLE management, pt required +2 assistance to  progress trunk to upright position    Transfers Overall transfer level: Needs assistance Equipment used: 2 person hand held assist Transfers: Squat Pivot Transfers     Squat pivot transfers: Max assist;+2 physical assistance;+2 safety/equipment     General transfer comment: maxA+2 for squat pivot from EOB to drop arm recliner to the left high surface to low surface.  Needed multiple squats to get all the way over into the chair. left pt in the recliner with the lift pad underneath her, communicated this to RN.  Ambulation/Gait             General Gait Details: non ambulatory at baseline   Stairs             Wheelchair Mobility    Modified Rankin (Stroke Patients Only)       Balance Overall balance assessment: Needs assistance Sitting-balance support: Bilateral upper extremity supported;Feet supported Sitting balance-[name]: Poor Sitting balance - Comments: pt required BUE support for stability sitting EOB;pt with forward flexion of head pt reporting her head felt very heavy, minguard-minA for stability sitting EOB pt with posterior lean initially   Standing balance support: Bilateral upper extremity supported Standing balance-[name]: Zero Standing balance comment: maxA+2 for standing balance                            Cognition Arousal/Alertness: Awake/alert Behavior During Therapy: Flat affect Overall Cognitive Status: No family/caregiver present to determine baseline cognitive functioning Area of Impairment: Problem solving;Safety/judgement;Awareness  Safety/Judgement: Decreased awareness of safety;Decreased awareness of deficits Awareness: Intellectual Problem Solving: Slow processing;Decreased initiation;Difficulty sequencing;Requires verbal cues;Requires tactile cues General Comments: slow processing but very pleasant. Cognition appears very similar to last admission, anticipate she is close to/at  baseline      Exercises      General Comments General comments (skin integrity, edema, etc.): VSS throughout, cleaned pt up prior to mobility due to incontinent urine      Pertinent Vitals/Pain Pain Assessment: Faces Pain Score: 6  Faces Pain Scale: Hurts even more Pain Location: headache Pain Descriptors / Indicators: Headache Pain Intervention(s): Limited activity within patient's tolerance;Monitored during session;Repositioned    Home Living Family/patient expects to be discharged to:: Private residence Living Arrangements: Non-relatives/Friends;Other relatives Available Help at Discharge: Family;Available 24 hours/day Type of Home: House Home Access: Ramped entrance   Home Layout: Two level;1/2 bath on main level;Able to live on main level with bedroom/bathroom Home Equipment: Bedside commode;Walker - 2 wheels;Wheelchair - manual Additional Comments: family reports they brought her home early from SNF due to poor care at that facility, has been pretty much total care at home- sometimes will help with transfers but is still max assist, sometimes is completely dependent    Prior Function Level of Independence: Needs assistance  Gait / Transfers Assistance Needed: WC for primary mobility, WC and bed bound and max-totalA for transfers ADL's / Homemaking Assistance Needed: max-total care     PT Goals (current goals can now be found in the care plan section) Acute Rehab PT Goals Patient Stated Goal: return home/avoid SNF Progress towards PT goals: Progressing toward goals    Frequency    Min 3X/week      PT Plan      Co-evaluation PT/OT/SLP Co-Evaluation/Treatment: Yes Reason for Co-Treatment: Complexity of the patient's impairments (multi-system involvement);For patient/therapist safety;To address functional/ADL transfers PT goals addressed during session: Mobility/safety with mobility;Balance;Strengthening/ROM OT goals addressed during session: ADL's and  self-care      AM-PAC PT "6 Clicks" Mobility   Outcome Measure  Help needed turning from your back to your side while in a flat bed without using bedrails?: A Lot Help needed moving from lying on your back to sitting on the side of a flat bed without using bedrails?: Total Help needed moving to and from a bed to a chair (including a wheelchair)?: Total Help needed standing up from a chair using your arms (e.g., wheelchair or bedside chair)?: Total Help needed to walk in hospital room?: Total Help needed climbing 3-5 steps with a railing? : Total 6 Click Score: 7    End of Session Equipment Utilized During Treatment: Gait belt Activity Tolerance: Patient limited by fatigue;Patient limited by pain Patient left: in chair;with call bell/phone within reach;with chair alarm set Nurse Communication: Mobility status;Need for lift equipment PT Visit Diagnosis: Muscle weakness (generalized) (M62.81);Difficulty in walking, not elsewhere classified (R26.2);Pain Pain - Right/Left:  (head) Pain - part of body:  (head)     Time: 3086-5784 PT Time Calculation (min) (ACUTE ONLY): 40 min  Charges:  $Therapeutic Activity: 8-22 mins                     Verdene Lennert, PT, DPT  Acute Rehabilitation Ortho Tech Supervisor [phone] pager [phone]) [phone] office

## 2020-11-24 NOTE — Progress Notes (Addendum)
Progress Note  Chief Complaint:    blood in stool and abnormal liver tests     ASSESSMENT / PLAN:    Brief history:  60 yo female with Cushing's syndrome, neurosarcoidosis, obesity, DM, OSA., PE on Eliquis, diverticulitis. Admitted in April with GI bleeding, underwent CTA with coil embolization. She had a colonic perforation and underwent exp lap, Hartman's procedure, colostomy. Readmitted on 11/22/20 with blood in her ostomy bag on Eliquis. Received Kcentra to reverse Eliquis.   # GI bleed per ostomy on Eliquis. Bleeding  has resolved.  --Anticoagulant on hold. Will defer to Dr. Henrene Pastor as when to resume --Hgb improved from 7.7 to 10.4 post 2 u PRBC  # Elevated liver chemistries. No hepatobiliary pathology on CT scan. Acute viral hepatitis serologies negative. Liver tests for autoimmune disease pending. .No episodes of hypotension to suggest shock liver.  --Ceruloplasmin is low at 13.7 but not reliable in setting of severe hypoalbuminemia. Furthermore based on her age and race, Wilson's disease is not suspected.  --Liver enzymes slowly improving except for bilirubin (cholestasis may take longer to resolve).  --Liver enzymes can be followed up outpatient   # Cushing's syndrome  # Right lung mass, for outpatient evaluation          SUBJECTIVE:    Feels okay. GI bleeding has stopped.    OBJECTIVE:    Scheduled inpatient medications:   B-complex with vitamin C  1 tablet Oral Daily   dexamethasone (DECADRON) injection  4 mg Intravenous Daily   gabapentin  100 mg Oral TID   insulin aspart  0-9 Units Subcutaneous TID WC   metoprolol tartrate  25 mg Oral BID   pantoprazole  40 mg Oral Q0600   Continuous inpatient infusions:   doxycycline (VIBRAMYCIN) IV 100 mg (11/24/20 1200)   piperacillin-tazobactam (ZOSYN)  IV 3.375 g (11/24/20 1352)   PRN inpatient medications: hydrALAZINE, metoprolol tartrate, polyvinyl alcohol  Vital signs in last 24 hours: Temp:  [98 F (36.7  C)-98.8 F (37.1 C)] 98.1 F (36.7 C) (07/09 1202) Pulse Rate:  [81-103] 98 (07/09 1202) Resp:  [15-20] 17 (07/09 1202) BP: (110-155)/(66-95) 155/80 (07/09 1202) SpO2:  [98 %-100 %] 100 % (07/09 1202) Last BM Date: 11/23/20  Intake/Output Summary (Last 24 hours) at 11/24/2020 1456 Last data filed at 11/24/2020 0900 Gross per 24 hour  Intake 2567 ml  Output 2100 ml  Net 467 ml     Physical Exam:  General: Alert female in NAD Heart:  Regular rate and rhythm.  Pulmonary: Normal respiratory effort Abdomen: Soft, nondistended, nontender. Normal bowel sounds.  Neurologic: Alert and oriented Psych: Pleasant. Cooperative.   Filed Weights   11/22/20 0114 11/22/20 1633  Weight: 92.2 kg 81.2 kg    Intake/Output from previous day: 07/08 0701 - 07/09 0700 In: 3198.9 [P.O.:1440; I.V.:61.4; Blood:988; IV Piggyback:699.6] Out: 4950 [Urine:4950] Intake/Output this shift: Total I/O In: 45.1 [Other:20; IV Piggyback:25.1] Out: -     Lab Results: Recent Labs    11/23/20 0713 11/23/20 1805 11/24/20 0450  WBC 7.2 7.7 6.9  HGB 7.1* 10.6* 10.4*  HCT 22.3* 31.6* 32.1*  PLT 102* 98* 96*   BMET Recent Labs    11/22/20 0402 11/23/20 0235 11/24/20 0450  NA 134* 134* 133*  K 3.4* 3.5 2.8*  CL 98 99 98  CO2 28 30 29   GLUCOSE 264* 123* 138*  BUN 5* <5* 5*  CREATININE 0.66 0.47 0.55  CALCIUM 8.8* 8.4* 7.8*   LFT Recent Labs  11/24/20 0450  PROT 4.1*  ALBUMIN 1.8*  AST 222*  ALT 146*  ALKPHOS 565*  BILITOT 2.7*   PT/INR Recent Labs    11/22/20 0403  LABPROT 14.7  INR 1.2   Hepatitis Panel Recent Labs    11/22/20 0402  HEPBSAG NON REACTIVE  HCVAB NON REACTIVE  HEPAIGM NON REACTIVE  HEPBIGM NON REACTIVE    VAS [place] LOWER EXTREMITY VENOUS (DVT)  Result Date: 11/23/2020  Lower Venous DVT Study Patient Name:  Rhonda [name] [facility]  Date of Exam:   11/23/2020 Medical Rec #: [id number]       Accession #:    [id number] Date of Birth: 07-08-1960       Patient Gender: F Patient  Age:   70Y Exam Location:  [facility] Procedure:      VAS [place] LOWER EXTREMITY VENOUS (DVT) Referring Phys: [id number] Margaree Mackintosh [facility] --------------------------------------------------------------------------------  Indications: Swelling, Edema, and elevated ddimer.  Comparison Study: 09/21/20 prior Performing Technologist: Archie Patten RVS  Examination Guidelines: A complete evaluation includes B-mode imaging, spectral Doppler, color Doppler, and power Doppler as needed of all accessible portions of each vessel. Bilateral testing is considered an integral part of a complete examination. Limited examinations for reoccurring indications may be performed as noted. The reflux portion of the exam is performed with the patient in reverse Trendelenburg.  +---------+---------------+---------+-----------+----------+-------------------+ RIGHT    CompressibilityPhasicitySpontaneityPropertiesThrombus Aging      +---------+---------------+---------+-----------+----------+-------------------+ CFV      Full           Yes      Yes                                      +---------+---------------+---------+-----------+----------+-------------------+ SFJ      Full                                                             +---------+---------------+---------+-----------+----------+-------------------+ FV Prox  Full                                                             +---------+---------------+---------+-----------+----------+-------------------+ FV Mid   Full                                                             +---------+---------------+---------+-----------+----------+-------------------+ FV DistalFull                                                             +---------+---------------+---------+-----------+----------+-------------------+ PFV      Full                                                              +---------+---------------+---------+-----------+----------+-------------------+  POP      Full           Yes      Yes                                      +---------+---------------+---------+-----------+----------+-------------------+ PTV      Full                                                             +---------+---------------+---------+-----------+----------+-------------------+ PERO                                                  Not well visualized +---------+---------------+---------+-----------+----------+-------------------+   +---------+---------------+---------+-----------+----------+-------------------+ LEFT     CompressibilityPhasicitySpontaneityPropertiesThrombus Aging      +---------+---------------+---------+-----------+----------+-------------------+ CFV      Full           Yes      Yes                                      +---------+---------------+---------+-----------+----------+-------------------+ SFJ      Full                                                             +---------+---------------+---------+-----------+----------+-------------------+ FV Prox  Full                                                             +---------+---------------+---------+-----------+----------+-------------------+ FV Mid   Full                                                             +---------+---------------+---------+-----------+----------+-------------------+ FV DistalFull                                                             +---------+---------------+---------+-----------+----------+-------------------+ PFV      Full                                                             +---------+---------------+---------+-----------+----------+-------------------+ POP      Full  Yes      Yes                                      +---------+---------------+---------+-----------+----------+-------------------+  PTV      Full                                                             +---------+---------------+---------+-----------+----------+-------------------+ PERO                                                  Not well visualized +---------+---------------+---------+-----------+----------+-------------------+     Summary: BILATERAL: - No evidence of deep vein thrombosis seen in the lower extremities, bilaterally. -No evidence of popliteal cyst, bilaterally.   *See table(s) above for measurements and observations.    Preliminary         Active Problems:   GI bleed   Hematochezia   Diverticulosis of colon without hemorrhage   Acute blood loss anemia   Elevated liver function tests     LOS: 2 days   Tye Savoy ,NP 11/24/2020, 2:56 PM  GI ATTENDING  Interval history and labs reviewed. Agree with interval progress note as outlined above. No further bleeding. Hg stable. LFT's gradually improving.  IMPRESSION: Lower GI bleed, likely diverticular. Resolved  Elevated liver tests improving. Likely reactive, as previously discussed. Other entities ruled out. Treatment is supportive and time  RECOMMENDATIONS: OK to Eliquis if medically important Trend LFT's over time  Will sign off. Thanks   Docia Chuck. Geri Seminole., M.D. [facility] Division of Gastroenterology

## 2020-11-24 NOTE — Evaluation (Signed)
Occupational Therapy Evaluation Patient Details Name: Rhonda [name] MRN: [id number] DOB: Apr 17, 1961 Today's Date: 11/24/2020    History of Present Illness 60yo female admitted 11/21/20 with c/o melena and hematochezia from colostomy bag. Found to have lower and suspected upper GIB, L foot cellulitis. 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 Pt reports  her husband was assisting her from bed to her w/c every morning at baseline. She reports she was able to complete UB ADL and her husband would assist her with LB ADL. Pt currently requires modA+2 for bed mobility, minguard-minA for stability sitting EOB and maxA+2 for squat-pivot transfer from EOB to drop arm recliner. Due to decline in current level of function, pt would benefit from acute OT to address established goals to facilitate safe D/C to venue listed below. At this time, pt would ideally benefit from d/c to SNF. However, she is adamant about returning home and will then require HHOT follow-up. Will continue to follow acutely.     Follow Up Recommendations  Supervision/Assistance - 24 hour;Home health OT    Equipment Recommendations  3 in 1 bedside commode;Other (comment) (drop arm, hoyer lift)    Recommendations for Other Services       Precautions / Restrictions Precautions Precautions: Fall;Other (comment) Precaution Comments: colostomy bag Restrictions Weight Bearing Restrictions: No      Mobility Bed Mobility Overal bed mobility: Needs Assistance Bed Mobility: Rolling;Supine to Sit Rolling: Mod assist   Supine to sit: Mod assist;HOB elevated;+2 for safety/equipment;+2 for physical assistance     General bed mobility comments: able to roll toward her left wtih modA, required modA for BLE management, pt required +2 assistance to  progress trunk to upright position    Transfers Overall transfer level: Needs assistance   Transfers: Squat Pivot Transfers     Squat pivot transfers: Max assist;+2 physical assistance;+2 safety/equipment     General transfer comment: maxA+2 for squat pivot from EOB to drop arm recliner. left pt in the recliner with the lift pad underneath her, communicated this to RN    Balance Overall balance assessment: Needs assistance Sitting-balance support: Bilateral upper extremity supported;Feet supported Sitting balance-[name]: Poor Sitting balance - Comments: pt required BUE support for stability sitting EOB;pt with forward flexion of head pt reporting her head felt very heavy, minguard-minA for stability sitting EOB pt with posterior lean initially   Standing balance support: Bilateral upper extremity supported Standing balance-[name]: Zero Standing balance comment: maxA+2 for standing balance                           ADL either performed or assessed with clinical judgement   ADL Overall ADL's : Needs assistance/impaired Eating/Feeding: Set up;Sitting   Grooming: Set up;Sitting   Upper Body Bathing: Min guard;Sitting   Lower Body Bathing: Moderate assistance   Upper Body Dressing : Min guard   Lower Body Dressing: Moderate assistance;Sitting/lateral leans   Toilet Transfer: Maximal assistance;Squat-pivot;Requires drop arm;+2 for physical assistance;+2 for safety/equipment Toilet Transfer Details (indicate cue type and reason): simulated from EOB to recliner         Functional mobility during ADLs: Maximal assistance;+2 for physical assistance;+2 for safety/equipment General ADL Comments: pt limited by decreased activity tolerance, BLE/BUE weakness, body habitus and generalized deconditioning  Vision         Perception     Praxis      Pertinent Vitals/Pain Pain Assessment: 0-10 Pain Score: 6  Pain Location: headache Pain Descriptors /  Indicators: Headache Pain Intervention(s): Limited activity within patient's tolerance;Monitored during session     Hand Dominance Right   Extremity/Trunk Assessment Upper Extremity Assessment Upper Extremity Assessment: Generalized weakness   Lower Extremity Assessment Lower Extremity Assessment: Generalized weakness   Cervical / Trunk Assessment Cervical / Trunk Assessment: Kyphotic   Communication Communication Communication: No difficulties   Cognition Arousal/Alertness: Awake/alert Behavior During Therapy: Flat affect Overall Cognitive Status: No family/caregiver present to determine baseline cognitive functioning Area of Impairment: Problem solving;Safety/judgement;Awareness                         Safety/Judgement: Decreased awareness of safety;Decreased awareness of deficits Awareness: Intellectual Problem Solving: Slow processing;Decreased initiation;Difficulty sequencing;Requires verbal cues;Requires tactile cues General Comments: slow processing but very pleasant. Cognition appears very similar to last admission, anticipate she is close to/at baseline   General Comments  noted skin break down on medial aspect of left butt cheek,    Exercises     Shoulder Instructions      Home Living Family/patient expects to be discharged to:: Private residence Living Arrangements: Non-relatives/Friends;Other relatives Available Help at Discharge: Family;Available 24 hours/day Type of Home: House Home Access: Ramped entrance     Home Layout: Two level;1/2 bath on main level;Able to live on main level with bedroom/bathroom Alternate Level Stairs-Number of Steps: flight Alternate Level Stairs-Rails: Right Bathroom Shower/Tub: Tub/shower unit   Bathroom Toilet: Handicapped height Bathroom Accessibility: Yes   Home Equipment: Bedside commode;Walker - 2 wheels;Wheelchair - manual   Additional Comments: family reports they brought her home early from SNF due to  poor care at that facility, has been pretty much total care at home- sometimes will help with transfers but is still max assist, sometimes is completely dependent      Prior Functioning/Environment Level of Independence: Needs assistance  Gait / Transfers Assistance Needed: WC for primary mobility, WC and bed bound and max-totalA for transfers ADL's / Homemaking Assistance Needed: max-total care            OT Problem List: Decreased strength;Decreased range of motion;Decreased activity tolerance;Impaired balance (sitting and/or standing);Decreased safety awareness;Pain      OT Treatment/Interventions: Self-care/ADL training;Therapeutic exercise;Energy conservation;DME and/or AE instruction;Therapeutic activities;Patient/family education;Balance training    OT Goals(Current goals can be found in the care plan section) Acute Rehab OT Goals Patient Stated Goal: return home/avoid SNF OT Goal Formulation: With patient Time For Goal Achievement: 12/08/20 Potential to Achieve Goals: Fair ADL Goals Pt Will Perform Grooming: with modified independence;sitting Pt Will Perform Lower Body Dressing: with min assist;sitting/lateral leans;with adaptive equipment Pt Will Transfer to Toilet: with mod assist;squat pivot transfer Additional ADL Goal #1: Pt will progress to EOB with moderate assistance in preparation for ADL.  OT Frequency: Min 2X/week   Barriers to D/C:            Co-evaluation PT/OT/SLP Co-Evaluation/Treatment: Yes Reason for Co-Treatment: For patient/therapist safety;To address functional/ADL transfers;Complexity of the patient's impairments (multi-system involvement)   OT goals addressed during session: ADL's and self-care      AM-PAC OT "6 Clicks" Daily Activity     Outcome Measure Help from another person eating meals?: A Little Help from another person taking care of personal grooming?: A Little Help from another person toileting, which includes  using toliet, bedpan,  or urinal?: A Lot Help from another person bathing (including washing, rinsing, drying)?: A Lot Help from another person to put on and taking off regular upper body clothing?: A Little Help from another person to put on and taking off regular lower body clothing?: A Lot 6 Click Score: 15   End of Session Equipment Utilized During Treatment: Gait belt Nurse Communication: Mobility status;Need for lift equipment  Activity Tolerance: Patient tolerated treatment well Patient left: in chair;with call bell/phone within reach;with chair alarm set;Other (comment) (with lift pad underneath)  OT Visit Diagnosis: Other abnormalities of gait and mobility (R26.89);Muscle weakness (generalized) (M62.81);History of falling (Z91.81);Other symptoms and signs involving cognitive function;Pain;Unsteadiness on feet (R26.81) Pain - part of body:  (headache)                Time: 1020-1105 OT Time Calculation (min): 45 min Charges:  OT General Charges $OT Visit: 1 Visit OT Evaluation $OT Eval Moderate Complexity: 1 Mod OT Treatments $Self Care/Home Management : 8-22 mins  Helene Kelp OTR/L Acute Rehabilitation Services Office: [phone]   Wyn Forster 11/24/2020, 1:24 PM

## 2020-11-25 ENCOUNTER — Inpatient Hospital Stay (HOSPITAL_COMMUNITY): Payer: 59

## 2020-11-25 LAB — CBC WITH DIFFERENTIAL/PLATELET
Abs Immature Granulocytes: 0.22 10*3/uL — ABNORMAL HIGH (ref 0.00–0.07)
Basophils Absolute: 0 10*3/uL (ref 0.0–0.1)
Basophils Relative: 0 %
Eosinophils Absolute: 0 10*3/uL (ref 0.0–0.5)
Eosinophils Relative: 0 %
HCT: 35 % — ABNORMAL LOW (ref 36.0–46.0)
Hemoglobin: 11.2 g/dL — ABNORMAL LOW (ref 12.0–15.0)
Immature Granulocytes: 3 %
Lymphocytes Relative: 11 %
Lymphs Abs: 0.8 10*3/uL (ref 0.7–4.0)
MCH: 29 pg (ref 26.0–34.0)
MCHC: 32 g/dL (ref 30.0–36.0)
MCV: 90.7 fL (ref 80.0–100.0)
Monocytes Absolute: 0.2 10*3/uL (ref 0.1–1.0)
Monocytes Relative: 3 %
Neutro Abs: 6.1 10*3/uL (ref 1.7–7.7)
Neutrophils Relative %: 83 %
Platelets: 90 10*3/uL — ABNORMAL LOW (ref 150–400)
RBC: 3.86 MIL/uL — ABNORMAL LOW (ref 3.87–5.11)
RDW: 16.6 % — ABNORMAL HIGH (ref 11.5–15.5)
WBC: 7.3 10*3/uL (ref 4.0–10.5)
nRBC: 1.1 % — ABNORMAL HIGH (ref 0.0–0.2)

## 2020-11-25 LAB — ANTI-SMOOTH MUSCLE ANTIBODY, IGG: F-Actin IgG: 2 Units (ref 0–19)

## 2020-11-25 LAB — COMPREHENSIVE METABOLIC PANEL
ALT: 143 U/L — ABNORMAL HIGH (ref 0–44)
AST: 196 U/L — ABNORMAL HIGH (ref 15–41)
Albumin: 1.9 g/dL — ABNORMAL LOW (ref 3.5–5.0)
Alkaline Phosphatase: 578 U/L — ABNORMAL HIGH (ref 38–126)
Anion gap: 8 (ref 5–15)
BUN: <5 mg/dL — ABNORMAL LOW (ref 6–20)
CO2: 27 mmol/L (ref 22–32)
Calcium: 8.2 mg/dL — ABNORMAL LOW (ref 8.9–10.3)
Chloride: 100 mmol/L (ref 98–111)
Creatinine, Ser: 0.5 mg/dL (ref 0.44–1.00)
GFR, Estimated: >60 mL/min (ref >60)
Glucose, Bld: 173 mg/dL — ABNORMAL HIGH (ref 70–99)
Potassium: 4 mmol/L (ref 3.5–5.1)
Sodium: 135 mmol/L (ref 135–145)
Total Bilirubin: 2.3 mg/dL — ABNORMAL HIGH (ref 0.3–1.2)
Total Protein: 4.2 g/dL — ABNORMAL LOW (ref 6.5–8.1)

## 2020-11-25 LAB — GLUCOSE, CAPILLARY
Glucose-Capillary: 125 mg/dL — ABNORMAL HIGH (ref 70–99)
Glucose-Capillary: 130 mg/dL — ABNORMAL HIGH (ref 70–99)
Glucose-Capillary: 212 mg/dL — ABNORMAL HIGH (ref 70–99)
Glucose-Capillary: 191 mg/dL — ABNORMAL HIGH (ref 70–99)

## 2020-11-25 LAB — MITOCHONDRIAL ANTIBODIES: Mitochondrial M2 Ab, IgG: <20 Units (ref 0.0–20.0)

## 2020-11-25 MED ORDER — AMLODIPINE BESYLATE 5 MG PO TABS
5.0000 mg | ORAL_TABLET | Freq: Every day | ORAL | Status: DC
  Administered 2020-11-25 – 2020-11-26 (×2): 5 mg via ORAL
  Filled 2020-11-25 (×2): qty 1

## 2020-11-25 MED ORDER — METOPROLOL TARTRATE 50 MG PO TABS
50.0000 mg | ORAL_TABLET | Freq: Two times a day (BID) | ORAL | Status: DC
  Administered 2020-11-25 – 2020-11-26 (×3): 50 mg via ORAL
  Filled 2020-11-25 (×3): qty 1

## 2020-11-25 NOTE — Progress Notes (Signed)
Pharmacy Antibiotic Note  Rhonda [name] is a 60 y.o. female admitted on 11/21/2020 with  intra-abdominal infection .  Pharmacy has been consulted for Zosyn dosing. WBC WNL. Renal function stable.   Plan: Continue Zosyn 3.375G IV q8h to be infused over 4 hours Trend WBC, temp, renal function   Temp (24hrs), Avg:98.5 F (36.9 C), Min:97.9 F (36.6 C), Max:99 F (37.2 C)  Cx: MRSA nares (-)  Recent Labs  Lab 11/21/20 0000 11/22/20 0402 11/22/20 0826 11/23/20 0235 11/23/20 0713 11/23/20 1805 11/24/20 0450 11/25/20 0026  WBC 8.4 8.3   < > 7.3 7.2 7.7 6.9 7.3  CREATININE 0.78 0.66  --  0.47  --   --  0.55 0.50   < > = values in this interval not displayed.     Estimated Creatinine Clearance: 76.5 mL/min (by C-G formula based on SCr of 0.5 mg/dL).    Levonne Spiller, PharmD PGY1 Acute Care Resident  November 25, 2020

## 2020-11-25 NOTE — Plan of Care (Signed)
  Problem: Education: Goal: Knowledge of General Education information will improve Description Including pain rating scale, medication(s)/side effects and non-pharmacologic comfort measures Outcome: Progressing   Problem: Health Behavior/Discharge Planning: Goal: Ability to manage health-related needs will improve Outcome: Progressing   

## 2020-11-25 NOTE — Progress Notes (Signed)
PROGRESS NOTE                                                                                                                                                                                                             Patient Demographics:    Rhonda [name], is a 60 y.o. female, DOB - [birth year], VJK:[id number]  Outpatient Primary MD for the patient is [place] date - 11/21/2020    Chief Complaint  Patient presents with   GI Problem       Brief Narrative (HPI from H&P)  Rhonda [name] is a 60 y.o. 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   Subjective:   Patient in bed, appears comfortable, denies any headache, no fever, no chest pain or pressure, no shortness of breath , no abdominal pain. No new focal weakness.   Assessment  & Plan :     Acute LGI bleed with acute blood loss related anemia. She has had history of lower GI bleed in the past requiring embolization and eventually colectomy and colostomy.  She was also on Eliquis.  Eliquis has been reversed in the ER, H&H shows some drop in hemoglobin levels requiring 2 units of packed RBC transfusion on 11/23/2020, this could be old bleeding, continue to monitor H&H post transfusion. GI IR and CCS on board.  Continue to monitor clinically better, if stable likely DC 11/26/20.    2.  Left foot cellulitis present on admission.  Patient hit her left foot on the door, on IV vancomycin >> Doxy on 11/23/20 + Zosyn , MRSA nasal PCR negative, check X Ray, much improvement.  3.  Asymptomatic transaminitis with hyperbilirubinemia.  Blood pressure stable, patient is asymptomatic, was  on high intensity statin which will be held, monitor numbers. Defer workup to the GI team.  4.  Hypertension.  Lopressor dose increased and added Norvasc for better control.  5.  History of neurosarcoidosis.  On chronic steroids, continue IV Decadron for now.  6.  History of PE (09/21/20) .  Was on Eliquis which is on hold  due to ongoing GI bleed.  Monitor closely.  Depending on clinical course may require IVC filter, negative Leg [place].  7.  Mildly elevated troponin caused by demand mismatch from anemia, chest pain-free, EKG nonacute, no further work-up needed.  8.  Hypokalemia.  Replaced aggresively.  Obesity: Follow with PCP Estimated body mass index is 31.79 kg/m as calculated from the following:   Height as of this encounter: 5\' 3"  (1.6 m).   Weight as of this encounter: 81.4 kg.          Condition - Guarded  Family Communication  :  husband Rhonda [name] [phone] 11/24/20, 11/25/20  Code Status :  Full  Consults  :  GI,CCS, IR  PUD Prophylaxis : PPI   Procedures  :     Leg [place] - No DVT  CT - Intraluminal extravasation of contrast just proximal to the left mid abdominal colostomy as it exits the rectus sheath, reflecting active GI bleeding, possibly diverticular in etiology. Direct endoscopic visualization via the colostomy is suggested. NON-VASCULAR Status post left hemicolectomy with left lower quadrant colostomy. Additional ancillary findings as above      Disposition Plan  :    Status is: Inpatient  Remains inpatient appropriate because:IV treatments appropriate due to intensity of illness or inability to take PO  Dispo: The patient is from: Home              Anticipated d/c is to: Home              Patient currently is not medically stable to d/c.   Difficult to place patient No   DVT Prophylaxis  :    SCDs Start: 11/22/20 0258   Lab Results  Component Value Date   PLT 90 (L) 11/25/2020    Diet :  Diet Order             Diet Carb Modified Fluid  consistency: Thin; Room service appropriate? Yes  Diet effective now                    Inpatient Medications  Scheduled Meds:  B-complex with vitamin C  1 tablet Oral Daily   dexamethasone (DECADRON) injection  4 mg Intravenous Daily   gabapentin  100 mg Oral TID   insulin aspart  0-9 Units Subcutaneous TID WC   metoprolol tartrate  50 mg Oral BID   pantoprazole  40 mg Oral Q0600   Continuous Infusions:  doxycycline (VIBRAMYCIN) IV 100 mg (11/25/20 0300)   piperacillin-tazobactam (ZOSYN)  IV 3.375 g (11/25/20 0515)   PRN Meds:.hydrALAZINE, metoprolol tartrate, polyvinyl alcohol  Antibiotics  :    Anti-infectives (From admission, onward)    Start     Dose/Rate Route Frequency Ordered Stop   11/23/20 1200  doxycycline (VIBRAMYCIN) 100 mg in sodium chloride 0.9 % 250 mL IVPB        100 mg 125 mL/hr over 120 Minutes Intravenous Every 12 hours 11/23/20 1045     11/23/20 0600  vancomycin (VANCOREADY) IVPB 1250 mg/250 mL  Status:  Discontinued        1,250 mg 166.7 mL/hr over 90 Minutes Intravenous Every 24 hours 11/22/20 0354 11/23/20 1044   11/22/20 1400  piperacillin-tazobactam (ZOSYN) IVPB 3.375 g        3.375 g 12.5 mL/hr over 240 Minutes Intravenous Every 8 hours 11/22/20 0343     11/22/20 0345  vancomycin (VANCOREADY) IVPB 1500 mg/300 mL        1,500 mg 150  mL/hr over 120 Minutes Intravenous  Once 11/22/20 0343 11/22/20 0820   11/22/20 0345  piperacillin-tazobactam (ZOSYN) IVPB 3.375 g        3.375 g 100 mL/hr over 30 Minutes Intravenous  Once 11/22/20 0343 11/22/20 2355       Time Spent in minutes  30   Rhonda [name] M.D on 11/25/2020 at 8:46 AM  To page go to [email or web address]   [facility] -  Office  [phone]   See all Orders from today for further details    Objective:   Vitals:   11/24/20 1948 11/24/20 2342 11/25/20 0337 11/25/20 0726  BP: (!) 150/95 (!) 161/95 (!) 147/102 (!) 149/114  Pulse: 94 83 88 89  Resp: 20 19 15 16   Temp: 98.8  F (37.1 C) 98.5 F (36.9 C) 98.1 F (36.7 C) 97.9 F (36.6 C)  TempSrc: Oral Oral Oral Oral  SpO2: 100% 100% 100% 100%  Weight:   81.4 kg   Height:        Wt Readings from Last 3 Encounters:  11/25/20 81.4 kg  09/28/20 92.2 kg  08/06/20 79.4 kg     Intake/Output Summary (Last 24 hours) at 11/25/2020 0846 Last data filed at 11/25/2020 0731 Gross per 24 hour  Intake 1351.87 ml  Output 5275 ml  Net -3923.13 ml     Physical Exam  Awake Alert, No new F.N deficits, Normal affect [place].AT,PERRAL Supple Neck,No JVD, No cervical lymphadenopathy appriciated.  Symmetrical Chest wall movement, Good air movement bilaterally, CTAB RRR,No Gallops, Rubs or new Murmurs, No Parasternal Heave +ve B.Sounds, Abd Soft, No tenderness, No organomegaly appriciated, No rebound - guarding or rigidity.  L. Foot mildly red, no warmth now, colostomy bag with stool     Data Review:    CBC Recent Labs  Lab 11/22/20 1644 11/22/20 1948 11/23/20 0235 11/23/20 0713 11/23/20 1805 11/24/20 0450 11/25/20 0026  WBC 9.6 7.0 7.3 7.2 7.7 6.9 7.3  HGB 7.6* 7.9* 7.0* 7.1* 10.6* 10.4* 11.2*  HCT 24.3* 25.5* 22.3* 22.3* 31.6* 32.1* 35.0*  PLT 136* 132* 123* 102* 98* 96* 90*  MCV 91.0 92.4 89.9 90.7 87.8 88.9 90.7  MCH 28.5 28.6 28.2 28.9 29.4 28.8 29.0  MCHC 31.3 31.0 31.4 31.8 33.5 32.4 32.0  RDW 17.0* 17.1* 17.0* 17.2* 15.4 16.0* 16.6*  LYMPHSABS 0.2* 0.3* 0.3* 0.5*  --   --  0.8  MONOABS 0.3 0.3 0.2 0.2  --   --  0.2  EOSABS 0.0 0.0 0.0 0.0  --   --  0.0  BASOSABS 0.0 0.0 0.0 0.0  --   --  0.0    Recent Labs  Lab 11/21/20 0000 11/22/20 0345 11/22/20 0402 11/22/20 0403 11/22/20 0641 11/22/20 1644 11/23/20 0235 11/24/20 0450 11/25/20 0026  NA 131*  --  134*  --   --   --  134* 133* 135  K 3.5  --  3.4*  --   --   --  3.5 2.8* 4.0  CL 93*  --  98  --   --   --  99 98 100  CO2 26  --  28  --   --   --  30 29 27   GLUCOSE 286*  --  264*  --   --   --  123* 138* 173*  BUN 7  --  5*  --   --    --  <5* 5* <5*  CREATININE 0.78  --  0.66  --   --   --  0.47 0.55 0.50  CALCIUM 8.9  --  8.8*  --   --   --  8.4* 7.8* 8.2*  AST 472*  --  448*  --   --   --  320* 222* 196*  ALT 205*  --  198*  --   --   --  169* 146* 143*  ALKPHOS 790*  --  780*  --   --   --  583* 565* 578*  BILITOT 3.1*  --  3.2*  --   --   --  2.3* 2.7* 2.3*  ALBUMIN 2.2*  --  2.1*  --   --   --  1.9* 1.8* 1.9*  MG  --  2.2  --   --   --   --  2.0 1.8  --   PROCALCITON  --   --   --   --   --  0.17 0.13 0.18  --   INR  --   --   --  1.2  --   --   --   --   --   HGBA1C  --   --   --   --  7.8*  --   --   --   --   BNP  --   --   --   --   --   --  160.8* 152.6*  --     ------------------------------------------------------------------------------------------------------------------ No results for input(s): CHOL, HDL, LDLCALC, TRIG, CHOLHDL, LDLDIRECT in the last 72 hours.  Lab Results  Component Value Date   HGBA1C 7.8 (H) 11/22/2020   ------------------------------------------------------------------------------------------------------------------ No results for input(s): TSH, T4TOTAL, T3FREE, THYROIDAB in the last 72 hours.  Invalid input(s): FREET3  Cardiac Enzymes No results for input(s): CKMB, TROPONINI, MYOGLOBIN in the last 168 hours.  Invalid input(s): CK ------------------------------------------------------------------------------------------------------------------    Component Value Date/Time   BNP 152.6 (H) 11/24/2020 0450    Micro Results Recent Results (from the past 240 hour(s))  Resp Panel by RT-PCR (Flu A&B, Covid) Nasopharyngeal Swab     Status: None   Collection Time: 11/22/20  2:11 AM   Specimen: Nasopharyngeal Swab; Nasopharyngeal(NP) swabs in vial transport medium  Result Value Ref Range Status   SARS Coronavirus 2 by RT PCR NEGATIVE NEGATIVE Final    Comment: (NOTE) SARS-CoV-2 target nucleic acids are NOT DETECTED.  The SARS-CoV-2 RNA is generally detectable in upper  respiratory specimens during the acute phase of infection. The lowest concentration of SARS-CoV-2 viral copies this assay can detect is 138 copies/mL. A negative result does not preclude SARS-Cov-2 infection and should not be used as the sole basis for treatment or other patient management decisions. A negative result may occur with  improper specimen collection/handling, submission of specimen other than nasopharyngeal swab, presence of viral mutation(s) within the areas targeted by this assay, and inadequate number of viral copies(<138 copies/mL). A negative result must be combined with clinical observations, patient history, and epidemiological information. The expected result is Negative.  Fact Sheet for Patients:  EntrepreneurPulse.com.au  Fact Sheet for Healthcare Providers:  IncredibleEmployment.be  This test is no t yet approved or cleared by the [place] FDA and  has been authorized for detection and/or diagnosis of SARS-CoV-2 by FDA under an Emergency Use Authorization (EUA). This EUA will remain  in effect (meaning this test can be used) for the duration of the COVID-19 declaration under Section 564(b)(1) of the Act, 21 U.S.C.section 360bbb-3(b)(1), unless the authorization is terminated  or revoked sooner.  Influenza A by PCR NEGATIVE NEGATIVE Final   Influenza B by PCR NEGATIVE NEGATIVE Final    Comment: (NOTE) The Xpert Xpress SARS-CoV-2/FLU/RSV plus assay is intended as an aid in the diagnosis of influenza from Nasopharyngeal swab specimens and should not be used as a sole basis for treatment. Nasal washings and aspirates are unacceptable for Xpert Xpress SARS-CoV-2/FLU/RSV testing.  Fact Sheet for Patients: EntrepreneurPulse.com.au  Fact Sheet for Healthcare Providers: IncredibleEmployment.be  This test is not yet approved or cleared by the [place] FDA and has been  authorized for detection and/or diagnosis of SARS-CoV-2 by FDA under an Emergency Use Authorization (EUA). This EUA will remain in effect (meaning this test can be used) for the duration of the COVID-19 declaration under Section 564(b)(1) of the Act, 21 U.S.C. section 360bbb-3(b)(1), unless the authorization is terminated or revoked.  Performed at [facility], [place] [address] [place], [place] [address]   MRSA Next Gen by PCR, Nasal     Status: None   Collection Time: 11/22/20  5:18 AM   Specimen: Nasal Mucosa; Nasal Swab  Result Value Ref Range Status   MRSA by PCR Next Gen NOT DETECTED NOT DETECTED Final    Comment: (NOTE) The GeneXpert MRSA Assay (FDA approved for NASAL specimens only), is one component of a comprehensive MRSA colonization surveillance program. It is not intended to diagnose MRSA infection nor to guide or monitor treatment for MRSA infections. Test performance is not FDA approved in patients less than 22 years old. Performed at [facility], [place] [address], [place],  [address]     Radiology Reports VAS [place] LOWER EXTREMITY VENOUS (DVT)  Result Date: 11/24/2020  Lower Venous DVT Study Patient Name:  ZARIA TAHA [facility]  Date of Exam:   11/23/2020 Medical Rec #: [id number]       Accession #:    [id number] Date of Birth: 08/29/1960       Patient Gender: F Patient Age:   28Y Exam Location:  [facility] Procedure:      VAS [place] LOWER EXTREMITY VENOUS (DVT) Referring Phys: [id number] [facility] --------------------------------------------------------------------------------  Indications: Swelling, Edema, and elevated ddimer.  Comparison Study: 09/21/20 prior Performing Technologist: Archie Patten RVS  Examination Guidelines: A complete evaluation includes B-mode imaging, spectral Doppler, color Doppler, and power Doppler as needed of all accessible portions of each vessel. Bilateral testing is considered an integral part of a complete examination. Limited  examinations for reoccurring indications may be performed as noted. The reflux portion of the exam is performed with the patient in reverse Trendelenburg.  +---------+---------------+---------+-----------+----------+-------------------+ RIGHT    CompressibilityPhasicitySpontaneityPropertiesThrombus Aging      +---------+---------------+---------+-----------+----------+-------------------+ CFV      Full           Yes      Yes                                      +---------+---------------+---------+-----------+----------+-------------------+ SFJ      Full                                                             +---------+---------------+---------+-----------+----------+-------------------+ FV Prox  Full                                                             +---------+---------------+---------+-----------+----------+-------------------+  FV Mid   Full                                                             +---------+---------------+---------+-----------+----------+-------------------+ FV DistalFull                                                             +---------+---------------+---------+-----------+----------+-------------------+ PFV      Full                                                             +---------+---------------+---------+-----------+----------+-------------------+ POP      Full           Yes      Yes                                      +---------+---------------+---------+-----------+----------+-------------------+ PTV      Full                                                             +---------+---------------+---------+-----------+----------+-------------------+ PERO                                                  Not well visualized +---------+---------------+---------+-----------+----------+-------------------+   +---------+---------------+---------+-----------+----------+-------------------+ LEFT      CompressibilityPhasicitySpontaneityPropertiesThrombus Aging      +---------+---------------+---------+-----------+----------+-------------------+ CFV      Full           Yes      Yes                                      +---------+---------------+---------+-----------+----------+-------------------+ SFJ      Full                                                             +---------+---------------+---------+-----------+----------+-------------------+ FV Prox  Full                                                             +---------+---------------+---------+-----------+----------+-------------------+ FV Mid   Full                                                             +---------+---------------+---------+-----------+----------+-------------------+  FV DistalFull                                                             +---------+---------------+---------+-----------+----------+-------------------+ PFV      Full                                                             +---------+---------------+---------+-----------+----------+-------------------+ POP      Full           Yes      Yes                                      +---------+---------------+---------+-----------+----------+-------------------+ PTV      Full                                                             +---------+---------------+---------+-----------+----------+-------------------+ PERO                                                  Not well visualized +---------+---------------+---------+-----------+----------+-------------------+     Summary: BILATERAL: - No evidence of deep vein thrombosis seen in the lower extremities, bilaterally. -No evidence of popliteal cyst, bilaterally.   *See table(s) above for measurements and observations. Electronically signed by Jamelle Haring on 11/24/2020 at 4:32:52 PM.    Final    CT Angio Abd/Pel W and/or Wo Contrast  Result  Date: 11/22/2020 CLINICAL DATA:  GI bleed, on blood thinners EXAM: CTA ABDOMEN AND PELVIS WITHOUT AND WITH CONTRAST TECHNIQUE: Multidetector CT imaging of the abdomen and pelvis was performed using the standard protocol during bolus administration of intravenous contrast. Multiplanar reconstructed images and MIPs were obtained and reviewed to evaluate the vascular anatomy. CONTRAST:  180mL OMNIPAQUE IOHEXOL 350 MG/ML SOLN COMPARISON:  CT abdomen/pelvis dated 09/26/2020 FINDINGS: VASCULAR Aorta: Patent. No evidence of abdominal aortic aneurysm. Mild atherosclerotic calcifications. Celiac: Patent. SMA: Patent. Renals: Patent bilaterally. IMA: Patent. Inflow: Patent. Proximal Outflow: Patent. Veins: Grossly unremarkable. Review of the MIP images confirms the above findings. NON-VASCULAR Lower chest: Lung bases are clear. Hepatobiliary: Liver is within normal limits. Gallbladder is unremarkable. No intrahepatic or extrahepatic ductal dilatation. Pancreas: Within normal limits. Spleen: Within normal limits. Adrenals/Urinary Tract: Adrenal glands are within normal limits. Subcentimeter left lower pole renal cyst (series 7/image 71). Right kidney is within normal limits. No hydronephrosis. Bladder is within normal limits. Stomach/Bowel: Stomach is within normal limits. No evidence of bowel obstruction. Appendix is not discretely visualized. Status post left hemicolectomy with left lower quadrant colostomy. Scattered colonic diverticulosis. On the arterial phase, there is possible early enhancement within a diverticulum just proximal to the left mid abdominal colostomy as it exits the rectus sheath (series 10/image 111),  equivocal. However, on venous phase, there is clear intraluminal extravasation of contrast in this location (series 17/image 109), confirming active GI bleeding in this location. Lymphatic: No suspicious abdominopelvic lymphadenopathy. Reproductive: Status post hysterectomy. Bilateral ovaries are within  normal limits. Other: No abdominopelvic ascites. Musculoskeletal: Benign intramuscular lipoma in the left gluteal musculature (series 10/image 153). Degenerative changes of the visualized thoracolumbar spine. IMPRESSION: VASCULAR Intraluminal extravasation of contrast just proximal to the left mid abdominal colostomy as it exits the rectus sheath, reflecting active GI bleeding, possibly diverticular in etiology. Direct endoscopic visualization via the colostomy is suggested. NON-VASCULAR Status post left hemicolectomy with left lower quadrant colostomy. Additional ancillary findings as above. Electronically Signed   By: Julian Hy M.D.   On: 11/22/2020 01:39

## 2020-11-25 NOTE — Plan of Care (Signed)
  Problem: Education: Goal: Knowledge of General Education information will improve Description Including pain rating scale, medication(s)/side effects and non-pharmacologic comfort measures Outcome: Progressing   

## 2020-11-26 ENCOUNTER — Telehealth: Payer: Self-pay

## 2020-11-26 ENCOUNTER — Other Ambulatory Visit: Payer: Self-pay

## 2020-11-26 DIAGNOSIS — R7401 Elevation of levels of liver transaminase levels: Secondary | ICD-10-CM

## 2020-11-26 LAB — CBC WITH DIFFERENTIAL/PLATELET
Abs Immature Granulocytes: 0.15 10*3/uL — ABNORMAL HIGH (ref 0.00–0.07)
Basophils Absolute: 0 10*3/uL (ref 0.0–0.1)
Basophils Relative: 0 %
Eosinophils Absolute: 0 10*3/uL (ref 0.0–0.5)
Eosinophils Relative: 0 %
HCT: 33.9 % — ABNORMAL LOW (ref 36.0–46.0)
Hemoglobin: 11.2 g/dL — ABNORMAL LOW (ref 12.0–15.0)
Immature Granulocytes: 3 %
Lymphocytes Relative: 8 %
Lymphs Abs: 0.5 10*3/uL — ABNORMAL LOW (ref 0.7–4.0)
MCH: 29.6 pg (ref 26.0–34.0)
MCHC: 33 g/dL (ref 30.0–36.0)
MCV: 89.7 fL (ref 80.0–100.0)
Monocytes Absolute: 0.1 10*3/uL (ref 0.1–1.0)
Monocytes Relative: 2 %
Neutro Abs: 5.2 10*3/uL (ref 1.7–7.7)
Neutrophils Relative %: 87 %
Platelets: 79 10*3/uL — ABNORMAL LOW (ref 150–400)
RBC: 3.78 MIL/uL — ABNORMAL LOW (ref 3.87–5.11)
RDW: 16.6 % — ABNORMAL HIGH (ref 11.5–15.5)
WBC: 5.9 10*3/uL (ref 4.0–10.5)
nRBC: 1 % — ABNORMAL HIGH (ref 0.0–0.2)

## 2020-11-26 LAB — COMPREHENSIVE METABOLIC PANEL
ALT: 129 U/L — ABNORMAL HIGH (ref 0–44)
AST: 162 U/L — ABNORMAL HIGH (ref 15–41)
Albumin: 2 g/dL — ABNORMAL LOW (ref 3.5–5.0)
Alkaline Phosphatase: 558 U/L — ABNORMAL HIGH (ref 38–126)
Anion gap: 7 (ref 5–15)
BUN: <5 mg/dL — ABNORMAL LOW (ref 6–20)
CO2: 27 mmol/L (ref 22–32)
Calcium: 8.3 mg/dL — ABNORMAL LOW (ref 8.9–10.3)
Chloride: 100 mmol/L (ref 98–111)
Creatinine, Ser: 0.43 mg/dL — ABNORMAL LOW (ref 0.44–1.00)
GFR, Estimated: >60 mL/min (ref >60)
Glucose, Bld: 139 mg/dL — ABNORMAL HIGH (ref 70–99)
Potassium: 3.2 mmol/L — ABNORMAL LOW (ref 3.5–5.1)
Sodium: 134 mmol/L — ABNORMAL LOW (ref 135–145)
Total Bilirubin: 1.9 mg/dL — ABNORMAL HIGH (ref 0.3–1.2)
Total Protein: 4.4 g/dL — ABNORMAL LOW (ref 6.5–8.1)

## 2020-11-26 LAB — GLUCOSE, CAPILLARY: Glucose-Capillary: 90 mg/dL (ref 70–99)

## 2020-11-26 MED ORDER — ATORVASTATIN CALCIUM 80 MG PO TABS: 80.0000 mg | ORAL_TABLET | Freq: Every day | ORAL | 0 refills | Status: DC

## 2020-11-26 MED ORDER — AMOXICILLIN-POT CLAVULANATE 875-125 MG PO TABS: 1.0000 | ORAL_TABLET | Freq: Two times a day (BID) | ORAL | 0 refills | Status: DC

## 2020-11-26 MED ORDER — APIXABAN 5 MG PO TABS
5.0000 mg | ORAL_TABLET | Freq: Two times a day (BID) | ORAL | Status: DC
  Administered 2020-11-26: 5 mg via ORAL
  Filled 2020-11-26: qty 1

## 2020-11-26 MED ORDER — AMOXICILLIN-POT CLAVULANATE 875-125 MG PO TABS: 1.0000 | ORAL_TABLET | Freq: Two times a day (BID) | ORAL | Status: DC

## 2020-11-26 MED ORDER — DOXYCYCLINE HYCLATE 100 MG PO TABS: 100.0000 mg | ORAL_TABLET | Freq: Two times a day (BID) | ORAL | 0 refills | Status: DC

## 2020-11-26 MED ORDER — DOXYCYCLINE HYCLATE 100 MG PO TABS
100.0000 mg | ORAL_TABLET | Freq: Two times a day (BID) | ORAL | Status: DC
  Administered 2020-11-26: 100 mg via ORAL
  Filled 2020-11-26: qty 1

## 2020-11-26 MED ORDER — POTASSIUM CHLORIDE CRYS ER 20 MEQ PO TBCR
40.0000 meq | EXTENDED_RELEASE_TABLET | Freq: Once | ORAL | Status: AC
  Administered 2020-11-26: 40 meq via ORAL
  Filled 2020-11-26: qty 2

## 2020-11-26 NOTE — Discharge Summary (Signed)
ADAMARYS SHALL YTK:[id number] DOB: 11-18-1960 DOA: 11/21/2020  PCP: [facility] date: 11/21/2020  Discharge date: 11/26/2020  Admitted From: Home  Disposition:  Home   Recommendations for Outpatient Follow-up:   Follow up with PCP in 1-2 weeks  PCP Please obtain BMP/CBC, 2 view CXR in 1week,  (see Discharge instructions)   PCP Please follow up on the following pending results: monitor CBC closely   Home Health: PT, RN Equipment/Devices: Hospital Bed  Consultations: GI Discharge Condition: Stable    CODE STATUS: Full    Diet Recommendation: Heart Healthy Low Carb  Diet Order             Diet - low sodium heart healthy           Diet Carb Modified Fluid consistency: Thin; Room service appropriate? Yes  Diet effective now                    Chief Complaint  Patient presents with   GI Problem     Brief history of present illness from the day of admission and additional interim summary    Rhonda [name] is a 60 y.o. 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                                                                 Hospital Course    Acute LGI bleed with acute blood loss related anemia. She has had history of lower GI bleed in the past requiring embolization and eventually colectomy and colostomy.  She was also on Eliquis.  Eliquis was reversed in the ER, got 2 units of packed RBC transfusion on 11/23/2020, no further drop, seen by GI, resume Eliquis on DC, PCP and GI to follow in 1-2 weeks.     2.  Left  foot cellulitis present on admission.  Patient hit her left foot on the door, much better with ABX will give for 5 more days PO, clinically much better , MRSA nasal PCR negative, stable X Ray.   3.  Asymptomatic transaminitis with hyperbilirubinemia.  Trend is improving, could have been due to transient hypotension and anemia along with high-dose statin which she is on, blood pressure is improved statin was held CMP improving, work-up by GI unrevealing, hold statin for another week PCP to keep an eye on CMP along with GI outpatient.   4.  Hypertension.  Continue home Rx.   5.  History of neurosarcoidosis.  On chronic steroids continue.   6.  History of PE (09/21/20) .  Was on Eliquis which is on hold due to ongoing GI bleed. Negative Leg [place]. Commence Eliquis upon DC.   7.  Mildly elevated troponin caused by demand mismatch from anemia, chest pain-free, EKG nonacute, no further work-up needed.   8.  Hypokalemia.  Replaced.   Discharge diagnosis     Active Problems:   Elevated transaminase level   GI bleed   Hematochezia   Diverticulosis of colon with hemorrhage   Acute blood loss anemia   Elevated liver function tests    Discharge instructions    Discharge Instructions     Diet - low sodium heart healthy   Complete by: As directed    Discharge instructions   Complete by: As directed    Follow with Primary MD [place]. and your gastroenterologist in 7 days   Get CBC, CMP -  checked next visit within 1 week by Primary MD   Activity: As tolerated with Full fall precautions use walker/cane & assistance as needed  Disposition Home     Diet: Heart healthy-low carbohydrate diet.  Accuchecks 4 times/day, Once in AM empty stomach and then before each meal. Log in all results and show them to your Prim.MD in 3 days. If any glucose reading is under 80 or above 300 call your Prim MD immidiately. Follow Low glucose instructions for glucose under 80 as  instructed.  Special Instructions: If you have smoked or chewed Tobacco  in the last 2 yrs please stop smoking, stop any regular Alcohol  and or any Recreational drug use.  On your next visit with your primary care physician please Get Medicines reviewed and adjusted.  Please request your Prim.MD to go over all Hospital Tests and Procedure/Radiological results at the follow up, please get all Hospital records sent to your Prim MD by signing hospital release before you go home.  If you experience worsening of your admission symptoms, develop shortness of breath, life threatening emergency, suicidal or homicidal thoughts you must seek medical attention immediately by calling 911 or calling your MD immediately  if symptoms less severe.  You Must read complete instructions/literature along with all the possible adverse reactions/side effects for all the Medicines you take and that have been prescribed to you. Take any new Medicines after you have completely understood and accpet all the possible adverse reactions/side effects.   Discharge wound care:   Complete by: As directed    Urostomy site clean and dry, keep small left buttock wound clean and dry   Increase activity slowly   Complete by: As directed        Discharge Medications   Allergies as of 11/26/2020       Reactions   Aspirin Swelling   Acetaminophen Other (See Comments)   Patient states tylenol makes her sick.         Medication List     STOP taking these medications    nitrofurantoin (macrocrystal-monohydrate) 100 MG capsule Commonly known as: MACROBID   oxyCODONE 5 MG immediate release tablet Commonly known as: Oxy IR/ROXICODONE   PRESCRIPTION MEDICATION       TAKE these medications    amoxicillin-clavulanate 875-125 MG tablet Commonly known as: AUGMENTIN Take 1 tablet by mouth every 12 (twelve) hours.   apixaban 5 MG Tabs tablet Commonly known as: ELIQUIS Take 1 tablet (5 mg total) by mouth 2 (two)  times daily. What changed: Another medication with the same name was removed. Continue taking this medication, and follow the directions you  see here.   atorvastatin 80 MG tablet Commonly known as: LIPITOR Take 1 tablet (80 mg total) by mouth daily at 6 PM. Start taking on: December 03, 2020 What changed: These instructions start on December 03, 2020. If you are unsure what to do until then, ask your doctor or other care provider.   b complex vitamins tablet Take 1 tablet by mouth daily.   blood glucose meter kit and supplies Kit Dispense based on patient and insurance preference. Use up to four times daily as directed.   CALCIUM+D3 PO Take 1 tablet by mouth daily with lunch.   doxycycline 100 MG tablet Commonly known as: VIBRA-TABS Take 1 tablet (100 mg total) by mouth every 12 (twelve) hours.   FeroSul 325 (65 FE) MG tablet Generic drug: ferrous sulfate Take 325 mg by mouth 2 (two) times daily.   Fish Oil 1000 MG Caps Take 1,000 mg by mouth daily with lunch.   gabapentin 100 MG capsule Commonly known as: NEURONTIN Take 100 mg by mouth 3 (three) times daily.   glucosamine-chondroitin 500-400 MG tablet Take 1 tablet by mouth 3 (three) times daily. What changed: when to take this   insulin glargine 100 UNIT/ML injection Commonly known as: LANTUS Inject 0.14 mLs (14 Units total) into the skin at bedtime. What changed: when to take this   Insulin Pen Needle 29G X Misc USE WITH LANTUS   melatonin 3 MG Tabs tablet Take 1 tablet (3 mg total) by mouth at bedtime. What changed: how much to take   metoprolol tartrate 25 MG tablet Commonly known as: LOPRESSOR Take 25 mg by mouth 2 (two) times daily.   multivitamin with minerals Tabs tablet Take 1 tablet by mouth daily with lunch.   ondansetron 4 MG disintegrating tablet Commonly known as: Zofran ODT Take 1 tablet (4 mg total) by mouth every 8 (eight) hours as needed for nausea or vomiting.   pantoprazole 40 MG  tablet Commonly known as: PROTONIX Take 1 tablet (40 mg total) by mouth daily.   polyethylene glycol 17 g packet Commonly known as: MIRALAX / GLYCOLAX Take 17 g by mouth 2 (two) times daily. What changed:  when to take this reasons to take this   predniSONE 20 MG tablet Commonly known as: DELTASONE Take 40 mg by mouth See admin instructions. 8am and 2pm   Refresh 1.4-0.6 % Soln Generic drug: Polyvinyl Alcohol-Povidone PF Place 1 drop into both eyes daily as needed (dry eyes).       ASK your doctor about these medications    docusate sodium 100 MG capsule Commonly known as: COLACE Take 1 capsule (100 mg total) by mouth 2 (two) times daily.   Ensure Max Protein Liqd Take 330 mLs (11 oz total) by mouth at bedtime.   feeding supplement (GLUCERNA SHAKE) Liqd Take 237 mLs by mouth 2 (two) times daily between meals.   insulin aspart 100 UNIT/ML FlexPen Commonly known as: NOVOLOG Inject 1-20 Units into the skin 3 (three) times daily with meals. CBG 70 - 120: 0 units  CBG 121 - 150: 3 units  CBG 151 - 200: 4 units  CBG 201 - 250: 7 units  CBG 251 - 300: 11 units  CBG 301 - 350: 15 units  CBG 351 - 400: 20 units               Durable Medical Equipment  (From admission, onward)           Start  Ordered   11/25/20 0729  For home use only DME Hospital bed  Once       Question Answer Comment  Length of Need 6 Months   The above medical condition requires: Patient requires the ability to reposition frequently   Head must be elevated greater than: 30 degrees   Bed type Semi-electric   [address] Yes   [place] Yes      11/25/20 0729              Discharge Care Instructions  (From admission, onward)           Start     Ordered   11/26/20 0000  Discharge wound care:       Comments: Urostomy site clean and dry, keep small left buttock wound clean and dry   11/26/20 [place].. Schedule an appointment as soon as possible for a visit in 1 week(s).   Why: And your gastroenterologist within a week Contact information: [place] [phone]                 Major procedures and Radiology Reports - PLEASE review detailed and final reports thoroughly  -       DG Foot Complete Left  Result Date: 11/25/2020 CLINICAL DATA:  LEFT foot pain EXAM: LEFT FOOT - COMPLETE 3+ VIEW COMPARISON:  None. FINDINGS: Osseous alignment is within normal limits. No fracture line or displaced fracture fragment is seen. Chronic spurring noted at the plantar and dorsal margins of the posterior calcaneus. Soft tissues about the LEFT foot are unremarkable. Vascular calcifications noted at the level of the LEFT ankle. IMPRESSION: 1. No acute findings. 2. 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 3. Vascular calcifications. Electronically Signed   By: Franki Cabot M.D.   On: 11/25/2020 09:46   VAS [place] LOWER EXTREMITY VENOUS (DVT)  Result Date: 11/24/2020  Lower Venous DVT Study Patient Name:  HILLARY STRUSS [facility]  Date of Exam:   11/23/2020 Medical Rec #: [id number]       Accession #:    [id number] Date of Birth: 1960/10/18       Patient Gender: F Patient Age:   24Y Exam Location:  [facility] Procedure:      VAS [place] LOWER EXTREMITY VENOUS (DVT) Referring Phys: [id number]intosh [facility] --------------------------------------------------------------------------------  Indications: Swelling, Edema, and elevated ddimer.  Comparison Study: 09/21/20 prior Performing Technologist: Archie Patten RVS  Examination Guidelines: A complete evaluation includes B-mode imaging, spectral Doppler, color Doppler, and power Doppler as needed of all accessible portions of each vessel. Bilateral testing is considered an integral part of a complete examination. Limited examinations for reoccurring indications may be performed as noted. The reflux portion of the exam is performed  with the patient in reverse Trendelenburg.  +---------+---------------+---------+-----------+----------+-------------------+ RIGHT    CompressibilityPhasicitySpontaneityPropertiesThrombus Aging      +---------+---------------+---------+-----------+----------+-------------------+ CFV      Full           Yes      Yes                                      +---------+---------------+---------+-----------+----------+-------------------+ SFJ      Full                                                             +---------+---------------+---------+-----------+----------+-------------------+  FV Prox  Full                                                             +---------+---------------+---------+-----------+----------+-------------------+ FV Mid   Full                                                             +---------+---------------+---------+-----------+----------+-------------------+ FV DistalFull                                                             +---------+---------------+---------+-----------+----------+-------------------+ PFV      Full                                                             +---------+---------------+---------+-----------+----------+-------------------+ POP      Full           Yes      Yes                                      +---------+---------------+---------+-----------+----------+-------------------+ PTV      Full                                                             +---------+---------------+---------+-----------+----------+-------------------+ PERO                                                  Not well visualized +---------+---------------+---------+-----------+----------+-------------------+   +---------+---------------+---------+-----------+----------+-------------------+ LEFT     CompressibilityPhasicitySpontaneityPropertiesThrombus Aging       +---------+---------------+---------+-----------+----------+-------------------+ CFV      Full           Yes      Yes                                      +---------+---------------+---------+-----------+----------+-------------------+ SFJ      Full                                                             +---------+---------------+---------+-----------+----------+-------------------+ FV Prox  Full                                                             +---------+---------------+---------+-----------+----------+-------------------+  FV Mid   Full                                                             +---------+---------------+---------+-----------+----------+-------------------+ FV DistalFull                                                             +---------+---------------+---------+-----------+----------+-------------------+ PFV      Full                                                             +---------+---------------+---------+-----------+----------+-------------------+ POP      Full           Yes      Yes                                      +---------+---------------+---------+-----------+----------+-------------------+ PTV      Full                                                             +---------+---------------+---------+-----------+----------+-------------------+ PERO                                                  Not well visualized +---------+---------------+---------+-----------+----------+-------------------+     Summary: BILATERAL: - No evidence of deep vein thrombosis seen in the lower extremities, bilaterally. -No evidence of popliteal cyst, bilaterally.   *See table(s) above for measurements and observations. Electronically signed by Jamelle Haring on 11/24/2020 at 4:32:52 PM.    Final    CT Angio Abd/Pel W and/or Wo Contrast  Result Date: 11/22/2020 CLINICAL DATA:  GI bleed, on blood thinners EXAM: CTA  ABDOMEN AND PELVIS WITHOUT AND WITH CONTRAST TECHNIQUE: Multidetector CT imaging of the abdomen and pelvis was performed using the standard protocol during bolus administration of intravenous contrast. Multiplanar reconstructed images and MIPs were obtained and reviewed to evaluate the vascular anatomy. CONTRAST:  112mL OMNIPAQUE IOHEXOL 350 MG/ML SOLN COMPARISON:  CT abdomen/pelvis dated 09/26/2020 FINDINGS: VASCULAR Aorta: Patent. No evidence of abdominal aortic aneurysm. Mild atherosclerotic calcifications. Celiac: Patent. SMA: Patent. Renals: Patent bilaterally. IMA: Patent. Inflow: Patent. Proximal Outflow: Patent. Veins: Grossly unremarkable. Review of the MIP images confirms the above findings. NON-VASCULAR Lower chest: Lung bases are clear. Hepatobiliary: Liver is within normal limits. Gallbladder is unremarkable. No intrahepatic or extrahepatic ductal dilatation. Pancreas: Within normal limits. Spleen: Within normal limits. Adrenals/Urinary Tract: Adrenal glands are within normal limits. Subcentimeter left lower pole renal cyst (series 7/image 71). Right kidney is within  normal limits. No hydronephrosis. Bladder is within normal limits. Stomach/Bowel: Stomach is within normal limits. No evidence of bowel obstruction. Appendix is not discretely visualized. Status post left hemicolectomy with left lower quadrant colostomy. Scattered colonic diverticulosis. On the arterial phase, there is possible early enhancement within a diverticulum just proximal to the left mid abdominal colostomy as it exits the rectus sheath (series 10/image 111), equivocal. However, on venous phase, there is clear intraluminal extravasation of contrast in this location (series 17/image 109), confirming active GI bleeding in this location. Lymphatic: No suspicious abdominopelvic lymphadenopathy. Reproductive: Status post hysterectomy. Bilateral ovaries are within normal limits. Other: No abdominopelvic ascites. 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 Degenerative changes of the visualized thoracolumbar spine. IMPRESSION: VASCULAR Intraluminal extravasation of contrast just proximal to the left mid abdominal colostomy as it exits the rectus sheath, reflecting active GI bleeding, possibly diverticular in etiology. Direct endoscopic visualization via the colostomy is suggested. NON-VASCULAR Status post left hemicolectomy with left lower quadrant colostomy. Additional ancillary findings as above. Electronically Signed   By: Julian Hy M.D.   On: 11/22/2020 01:39    Micro Results    Recent Results (from the past 240 hour(s))  Resp Panel by RT-PCR (Flu A&B, Covid) Nasopharyngeal Swab     Status: None   Collection Time: 11/22/20  2:11 AM   Specimen: Nasopharyngeal Swab; Nasopharyngeal(NP) swabs in vial transport medium  Result Value Ref Range Status   SARS Coronavirus 2 by RT PCR NEGATIVE NEGATIVE Final    Comment: (NOTE) SARS-CoV-2 target nucleic acids are NOT DETECTED.  The SARS-CoV-2 RNA is generally detectable in upper respiratory specimens during the acute phase of infection. The lowest concentration of SARS-CoV-2 viral copies this assay can detect is 138 copies/mL. A negative result does not preclude SARS-Cov-2 infection and should not be used as the sole basis for treatment or other patient management decisions. A negative result may occur with  improper specimen collection/handling, submission of specimen other than nasopharyngeal swab, presence of viral mutation(s) within the areas targeted by this assay, and inadequate number of viral copies(<138 copies/mL). A negative result must be combined with clinical observations, patient history, and epidemiological information. The expected result is Negative.  Fact Sheet for Patients:  EntrepreneurPulse.com.au  Fact Sheet for Healthcare Providers:   IncredibleEmployment.be  This test is no t yet approved or cleared by the [place] FDA and  has been authorized for detection and/or diagnosis of SARS-CoV-2 by FDA under an Emergency Use Authorization (EUA). This EUA will remain  in effect (meaning this test can be used) for the duration of the COVID-19 declaration under Section 564(b)(1) of the Act, 21 U.S.C.section 360bbb-3(b)(1), unless the authorization is terminated  or revoked sooner.       Influenza A by PCR NEGATIVE NEGATIVE Final   Influenza B by PCR NEGATIVE NEGATIVE Final    Comment: (NOTE) The Xpert Xpress SARS-CoV-2/FLU/RSV plus assay is intended as an aid in the diagnosis of influenza from Nasopharyngeal swab specimens and should not be used as a sole basis for treatment. Nasal washings and aspirates are unacceptable for Xpert Xpress SARS-CoV-2/FLU/RSV testing.  Fact Sheet for Patients: EntrepreneurPulse.com.au  Fact Sheet for Healthcare Providers: IncredibleEmployment.be  This test is not yet approved or cleared by the [place] FDA and has been authorized for detection and/or diagnosis of SARS-CoV-2 by FDA under an Emergency Use Authorization (EUA). This EUA will remain in effect (meaning this test can be used) for the duration  of the COVID-19 declaration under Section 564(b)(1) of the Act, 21 U.S.C. section 360bbb-3(b)(1), unless the authorization is terminated or revoked.  Performed at [facility], [place] [address] [place], [place] [address]   MRSA Next Gen by PCR, Nasal     Status: None   Collection Time: 11/22/20  5:18 AM   Specimen: Nasal Mucosa; Nasal Swab  Result Value Ref Range Status   MRSA by PCR Next Gen NOT DETECTED NOT DETECTED Final    Comment: (NOTE) The GeneXpert MRSA Assay (FDA approved for NASAL specimens only), is one component of a comprehensive MRSA colonization surveillance program. It is not intended to diagnose  MRSA infection nor to guide or monitor treatment for MRSA infections. Test performance is not FDA approved in patients less than 11 years old. Performed at [facility], [place] [address], [place], [place] [address]     Today   Subjective    Sena Hoopingarner today has no headache,no chest abdominal pain,no new weakness tingling or numbness, feels much better wants to go home today.   Objective   Blood pressure 121/86, pulse 81, temperature 97.8 F (36.6 C), temperature source Oral, resp. rate 16, height $RemoveBe'5\' 3"'YENjpNfgR$  (1.6 m), weight 81.4 kg, SpO2 100 %.   Intake/Output Summary (Last 24 hours) at 11/26/2020 0949 Last data filed at 11/26/2020 0754 Gross per 24 hour  Intake 480 ml  Output 5450 ml  Net -4970 ml    Exam  Awake Alert, No new F.N deficits, colostomy with brown stool, L foot cellulitis almost completely resolved [place].AT,PERRAL Supple Neck,No JVD, No cervical lymphadenopathy appriciated.  Symmetrical Chest wall movement, Good air movement bilaterally, CTAB RRR,No Gallops,Rubs or new Murmurs, No Parasternal Heave +ve B.Sounds, Abd Soft, Non tender, No organomegaly appriciated, No rebound -guarding or rigidity. No Cyanosis, Clubbing or edema, No new Rash or bruise   Data Review   CBC w Diff:  Lab Results  Component Value Date   WBC 5.9 11/26/2020   HGB 11.2 (L) 11/26/2020   HGB 9.0 (L) 08/06/2020   HCT 33.9 (L) 11/26/2020   HCT 28.4 (L) 08/06/2020   PLT 79 (L) 11/26/2020   PLT 208 08/06/2020   LYMPHOPCT 8 11/26/2020   MONOPCT 2 11/26/2020   EOSPCT 0 11/26/2020   BASOPCT 0 11/26/2020    CMP:  Lab Results  Component Value Date   NA 134 (L) 11/26/2020   NA 129 (L) 08/06/2020   K 3.2 (L) 11/26/2020   CL 100 11/26/2020   CO2 27 11/26/2020   BUN <5 (L) 11/26/2020   BUN 6 08/06/2020   CREATININE 0.43 (L) 11/26/2020   CREATININE 0.85 08/02/2015   PROT 4.4 (L) 11/26/2020   PROT 4.9 (L) 08/06/2020   ALBUMIN 2.0 (L) 11/26/2020   ALBUMIN 3.2 (L) 08/06/2020    BILITOT 1.9 (H) 11/26/2020   BILITOT 0.4 08/06/2020   ALKPHOS 558 (H) 11/26/2020   AST 162 (H) 11/26/2020   ALT 129 (H) 11/26/2020  .   Total Time in preparing paper work, data evaluation and todays exam - 82 minutes  Lala Lund M.D on 11/26/2020 at 9:49 AM  [facility]

## 2020-11-26 NOTE — Telephone Encounter (Signed)
-----   Message from Willia Craze, NP sent at 11/24/2020  4:55 PM EDT ----- Rhonda [name], please call this patient and arrange for LFTs in one week. She can come in around the 18th. Diagnosis is elevated LFTs. Thanks

## 2020-11-26 NOTE — Progress Notes (Signed)
Ok to resume apixaban per Dr. Madie Reno and change zosyn to PO Augmentin to complete a total of 7d with doxycycline.  Onnie Boer, PharmD, BCIDP, AAHIVP, CPP Infectious Disease Pharmacist 11/26/2020 8:52 AM

## 2020-11-26 NOTE — Progress Notes (Signed)
AVS reviewed with husband. AVS printed and given to [facility] staff. Patient Dc'd home via ambulence/stretcher.

## 2020-11-26 NOTE — Discharge Instructions (Addendum)
Follow with Primary MD [place]. and your gastroenterologist in 7 days   Get CBC, CMP -  checked next visit within 1 week by Primary MD   Activity: As tolerated with Full fall precautions use walker/cane & assistance as needed  Disposition Home     Diet: Heart healthy-low carbohydrate diet.  Special Instructions: If you have smoked or chewed Tobacco  in the last 2 yrs please stop smoking, stop any regular Alcohol  and or any Recreational drug use.  On your next visit with your primary care physician please Get Medicines reviewed and adjusted.  Please request your Prim.MD to go over all Hospital Tests and Procedure/Radiological results at the follow up, please get all Hospital records sent to your Prim MD by signing hospital release before you go home.  If you experience worsening of your admission symptoms, develop shortness of breath, life threatening emergency, suicidal or homicidal thoughts you must seek medical attention immediately by calling 911 or calling your MD immediately  if symptoms less severe.  You Must read complete instructions/literature along with all the possible adverse reactions/side effects for all the Medicines you take and that have been prescribed to you. Take any new Medicines after you have completely understood and accpet all the possible adverse reactions/side effects.    Information on my medicine - ELIQUIS (apixaban)  This medication education was reviewed with me or my healthcare representative as part of my discharge preparation.  The pharmacist that spoke with me during my hospital stay was:    Why was Eliquis prescribed for you? Eliquis was prescribed to treat blood clots that may have been found in the veins of your legs (deep vein thrombosis) or in your lungs (pulmonary embolism) and to reduce the risk of them occurring again.  What do You need to know about Eliquis ? Continue Eliquis 5 mg tablet taken TWICE daily.  Eliquis may  be taken with or without food.   Try to take the dose about the same time in the morning and in the evening. If you have difficulty swallowing the tablet whole please discuss with your pharmacist how to take the medication safely.  Take Eliquis exactly as prescribed and DO NOT stop taking Eliquis without talking to the doctor who prescribed the medication.  Stopping may increase your risk of developing a new blood clot.  Refill your prescription before you run out.  After discharge, you should have regular check-up appointments with your healthcare provider that is prescribing your Eliquis.    What do you do if you miss a dose? If a dose of ELIQUIS is not taken at the scheduled time, take it as soon as possible on the same day and twice-daily administration should be resumed. The dose should not be doubled to make up for a missed dose.  Important Safety Information A possible side effect of Eliquis is bleeding. You should call your healthcare provider right away if you experience any of the following: Bleeding from an injury or your nose that does not stop. Unusual colored urine (red or dark brown) or unusual colored stools (red or black). Unusual bruising for unknown reasons. A serious fall or if you hit your head (even if there is no bleeding).  Some medicines may interact with Eliquis and might increase your risk of bleeding or clotting while on Eliquis. To help avoid this, consult your healthcare provider or pharmacist prior to using any new prescription or non-prescription medications, including herbals, vitamins, non-steroidal anti-inflammatory drugs (  NSAIDs) and supplements.  This website has more information on Eliquis (apixaban): http://www.eliquis.com/eliquis/home

## 2020-11-26 NOTE — Telephone Encounter (Signed)
Spoke with the spouse. He agrees to bring the patient to [facility] lab on Monday 12/03/20 for labs.

## 2020-11-26 NOTE — TOC Initial Note (Signed)
Transition of Care [facility]) - Initial/Assessment Note    Patient Details  Name: Rhonda [name] MRN: [id number] Date of Birth: 06/10/1960  Transition of Care [facility]) CM/SW Contact:    Bethena Roys, RN Phone Number: 11/26/2020, 11:12 AM  Clinical Narrative: Risk for readmission assessment completed. Prior to arrival patient was from home with spouse. Per patient she directs all conversations to her spouse. Per spouse, the patient has a rolling walker, wheelchair, and bedside commode in the home. Patient and spouse are agreeable to hospital bed and hoyer lift. Durable medical equipment (DME) ordered via [employer]. DME will not be delivered to the home today; family is aware that it may take up to 48 hours prior to delivery. Patient is currently active with [facility]. Case Manager reached out to [place] liaison to make her aware of transition home. Patient in need of [facility] for transportation, Case Manager called [facility] at 1100- stated ETA of 30 minutes- staff RN aware. Medical necessity placed on the shadow chart. No further needs from Case Manager at this time.               Expected Discharge Plan: [place] Barriers to Discharge: No Barriers Identified   Patient Goals and CMS Choice Patient states their goals for this hospitalization and ongoing recovery are:: to return home with spouse.   Choice offered to / list presented to : NA (Patient was currently active with [facility]  Expected Discharge Plan and Services Expected Discharge Plan: [place] In-house Referral: NA Discharge Planning Services: CM Consult Post Acute Care Choice: Home Health, Durable Medical Equipment Living arrangements for the past 2 months: Single Family Home Expected Discharge Date: 11/26/20               DME Arranged: Hospital bed [facility].) DME Agency: Other - Comment Date DME Agency Contacted: 11/26/20 Time DME Agency Contacted:  1000 Representative spoke with at DME Agency: Freda Munro HH Arranged: RN, Disease Management, PT, OT, Nurse's Aide, Social Work [employer] Agency: [place] Date [place]: 11/26/20 Time [place]: 67 Representative spoke with at [place]: [place] Arrangements/Services Living arrangements for the past 2 months: [place] with:: Self, Spouse Patient language and need for interpreter reviewed:: Yes Do you feel safe going back to the place where you live?: Yes      Need for Family Participation in Patient Care: Yes (Comment) Care giver support system in place?: Yes (comment) Current home services: DME (Patient has BSC, RW and WC in the home.) Criminal Activity/Legal Involvement Pertinent to Current Situation/Hospitalization: No - Comment as needed  Permission Sought/Granted Permission sought to share information with : Family Supports, Customer service manager, Case Optician, dispensing granted to share information with : Yes, Verbal Permission Granted     Permission granted to share info w AGENCY: [employer], [facility]        Emotional Assessment Appearance:: Appears stated age Attitude/Demeanor/Rapport: Engaged Affect (typically observed): Appropriate Orientation: : Oriented to Self, Oriented to Place, Oriented to  Time, Oriented to Situation Alcohol / Substance Use: Not Applicable Psych Involvement: No (comment)  Admission diagnosis:  GI bleed [K92.2] Elevated transaminase level [R74.01] Gastrointestinal hemorrhage associated with intestinal diverticulosis [K57.91] Patient Active Problem List   Diagnosis Date Noted   Hematochezia    Diverticulosis of colon with hemorrhage    Acute blood loss anemia    Elevated liver function tests  Bowel perforation ([place]) 09/26/2020   Hypotension    Rectal bleeding    AKI (acute kidney injury) ([place])    GI bleed 08/29/2020   Neurosarcoidosis 08/06/2020   High risk  medication use 08/06/2020   Weakness 08/06/2020   Type 2 diabetes mellitus without complication, with long-term current use of insulin ([place]) 08/06/2020   Acute lower GI bleeding 07/23/2020   Hyperosmolar hyperglycemic state (HHS) ([place]) 07/23/2020   Diverticulitis of large intestine with bleeding 07/23/2020   Liver lesion 07/23/2020   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   HLD (hyperlipidemia) 01/12/2020   Acute lower UTI 01/12/2020   AMS (altered mental status) 01/11/2020   Memory changes 01/08/2020   Altered mental status    Hyponatremia    Speech disturbance    Demyelinating changes in brain ([place]) 11/13/2019   Left pontine stroke ([place]) 05/23/2019   Elevated transaminase level 05/23/2019   Nausea and vomiting 05/23/2019   Leukocytosis 05/23/2019   Thoracic lymphadenopathy 08/26/2018   Axillary lymphadenopathy 08/26/2018   Piriformis syndrome of right side 09/29/2016   OSA (obstructive sleep apnea) 09/27/2015   Right hip pain 04/11/2015   Essential hypertension 11/07/2014   Chronic low back pain 11/07/2014   Prediabetes 11/07/2014   Vitamin D deficiency 11/07/2014   PCP:  [place]:   [facility] [place] [place] [address] Phone: [phone] Fax: [phone]  [facility] [address] [place] [address] Phone: [phone] Fax: [phone]   Readmission Risk Interventions Readmission Risk Prevention Plan 11/26/2020 09/12/2020 09/04/2020  Transportation Screening Complete Complete Complete  Medication Review (RN Care Manager) Complete - Referral to Pharmacy  PCP or Specialist appointment within 3-5 days of discharge Complete Complete Complete  HRI or Home Care Consult Complete Complete Complete  SW Recovery Care/Counseling Consult Complete Complete Complete  Palliative Care Screening Not Applicable Not Applicable Not  Applicable  Skilled Nursing Facility Not Applicable Complete Complete  Some recent data might be hidden

## 2020-12-05 LAB — ANTINUCLEAR ANTIBODIES, IFA: ANA Ab, IFA: NEGATIVE

## 2020-12-21 ENCOUNTER — Other Ambulatory Visit: Payer: Self-pay

## 2020-12-21 ENCOUNTER — Telehealth: Payer: Self-pay

## 2020-12-21 NOTE — Telephone Encounter (Signed)
Spoke with Mr Pelter. He will bring the patient to our lab next week for a repeat LFT. Order is in [place]. Previous lab is available in [place].

## 2020-12-21 NOTE — Telephone Encounter (Signed)
-----   Message from Willia Craze, NP sent at 12/20/2020  6:41 PM EDT ----- Eustaquio Maize, I sent you a message earlier.  So we saw this patient in the hospital, she has never been in the office.  Her liver tests were elevated in the hospital.  Her outpatient provider has since checked them and they are still quite elevated.  We please contact this patient and ask her to come in for follow-up LFTs early next week?  Thanks

## 2020-12-27 ENCOUNTER — Other Ambulatory Visit: Payer: Self-pay | Admitting: *Deleted

## 2020-12-27 ENCOUNTER — Other Ambulatory Visit (INDEPENDENT_AMBULATORY_CARE_PROVIDER_SITE_OTHER): Payer: 59

## 2020-12-27 DIAGNOSIS — R7401 Elevation of levels of liver transaminase levels: Secondary | ICD-10-CM

## 2020-12-27 LAB — HEPATIC FUNCTION PANEL
ALT: 73 U/L — ABNORMAL HIGH (ref 0–35)
AST: 37 U/L (ref 0–37)
Albumin: 2.6 g/dL — ABNORMAL LOW (ref 3.5–5.2)
Alkaline Phosphatase: 422 U/L — ABNORMAL HIGH (ref 39–117)
Bilirubin, Direct: 0.3 mg/dL (ref 0.0–0.3)
Total Bilirubin: 1 mg/dL (ref 0.2–1.2)
Total Protein: 4.7 g/dL — ABNORMAL LOW (ref 6.0–8.3)

## 2020-12-31 ENCOUNTER — Other Ambulatory Visit: Payer: Self-pay | Admitting: *Deleted

## 2020-12-31 DIAGNOSIS — R7989 Other specified abnormal findings of blood chemistry: Secondary | ICD-10-CM

## 2021-02-16 ENCOUNTER — Encounter (HOSPITAL_COMMUNITY): Payer: Self-pay | Admitting: *Deleted

## 2021-02-16 ENCOUNTER — Other Ambulatory Visit: Payer: Self-pay

## 2021-02-16 ENCOUNTER — Emergency Department (HOSPITAL_COMMUNITY)
Admission: EM | Admit: 2021-02-16 | Discharge: 2021-02-17 | Disposition: A | Discharge status: Home / Self Care | Payer: 59 | Attending: Emergency Medicine | Admitting: Emergency Medicine

## 2021-02-16 DIAGNOSIS — E119 Type 2 diabetes mellitus without complications: Secondary | ICD-10-CM | POA: Insufficient documentation

## 2021-02-16 DIAGNOSIS — N390 Urinary tract infection, site not specified: Secondary | ICD-10-CM | POA: Insufficient documentation

## 2021-02-16 DIAGNOSIS — R531 Weakness: Secondary | ICD-10-CM | POA: Diagnosis present

## 2021-02-16 DIAGNOSIS — D72829 Elevated white blood cell count, unspecified: Secondary | ICD-10-CM | POA: Diagnosis not present

## 2021-02-16 DIAGNOSIS — I10 Essential (primary) hypertension: Secondary | ICD-10-CM | POA: Diagnosis not present

## 2021-02-16 DIAGNOSIS — Z79899 Other long term (current) drug therapy: Secondary | ICD-10-CM | POA: Diagnosis not present

## 2021-02-16 DIAGNOSIS — E46 Unspecified protein-calorie malnutrition: Secondary | ICD-10-CM | POA: Diagnosis not present

## 2021-02-16 DIAGNOSIS — Z794 Long term (current) use of insulin: Secondary | ICD-10-CM | POA: Insufficient documentation

## 2021-02-16 DIAGNOSIS — Z7984 Long term (current) use of oral hypoglycemic drugs: Secondary | ICD-10-CM | POA: Insufficient documentation

## 2021-02-16 DIAGNOSIS — L89153 Pressure ulcer of sacral region, stage 3: Secondary | ICD-10-CM | POA: Insufficient documentation

## 2021-02-16 LAB — CBC
HCT: 37.4 % (ref 36.0–46.0)
Hemoglobin: 11.4 g/dL — ABNORMAL LOW (ref 12.0–15.0)
MCH: 28.2 pg (ref 26.0–34.0)
MCHC: 30.5 g/dL (ref 30.0–36.0)
MCV: 92.6 fL (ref 80.0–100.0)
Platelets: 190 10*3/uL (ref 150–400)
RBC: 4.04 MIL/uL (ref 3.87–5.11)
RDW: 15.7 % — ABNORMAL HIGH (ref 11.5–15.5)
WBC: 14 10*3/uL — ABNORMAL HIGH (ref 4.0–10.5)
nRBC: 0 % (ref 0.0–0.2)

## 2021-02-16 LAB — COMPREHENSIVE METABOLIC PANEL
ALT: 60 U/L — ABNORMAL HIGH (ref 0–44)
AST: 57 U/L — ABNORMAL HIGH (ref 15–41)
Albumin: 1.7 g/dL — ABNORMAL LOW (ref 3.5–5.0)
Alkaline Phosphatase: 337 U/L — ABNORMAL HIGH (ref 38–126)
Anion gap: 11 (ref 5–15)
BUN: <5 mg/dL — ABNORMAL LOW (ref 6–20)
CO2: 30 mmol/L (ref 22–32)
Calcium: 8.1 mg/dL — ABNORMAL LOW (ref 8.9–10.3)
Chloride: 90 mmol/L — ABNORMAL LOW (ref 98–111)
Creatinine, Ser: 0.41 mg/dL — ABNORMAL LOW (ref 0.44–1.00)
GFR, Estimated: >60 mL/min (ref >60)
Glucose, Bld: 231 mg/dL — ABNORMAL HIGH (ref 70–99)
Potassium: 3.1 mmol/L — ABNORMAL LOW (ref 3.5–5.1)
Sodium: 131 mmol/L — ABNORMAL LOW (ref 135–145)
Total Bilirubin: 0.6 mg/dL (ref 0.3–1.2)
Total Protein: 4.7 g/dL — ABNORMAL LOW (ref 6.5–8.1)

## 2021-02-16 LAB — I-STAT BETA HCG BLOOD, ED (MC, WL, AP ONLY): I-stat hCG, quantitative: <5 m[IU]/mL (ref <5)

## 2021-02-16 LAB — LIPASE, BLOOD: Lipase: 36 U/L (ref 11–51)

## 2021-02-16 NOTE — ED Triage Notes (Signed)
The pt has an ulcer in her rectum and for the past 2-3 days the pain has increasedwith bleeding

## 2021-02-17 ENCOUNTER — Emergency Department (HOSPITAL_COMMUNITY): Payer: 59

## 2021-02-17 LAB — URINALYSIS, ROUTINE W REFLEX MICROSCOPIC
Bilirubin Urine: NEGATIVE
Glucose, UA: NEGATIVE mg/dL
Ketones, ur: NEGATIVE mg/dL
Nitrite: POSITIVE — AB
Protein, ur: NEGATIVE mg/dL
RBC / HPF: >50 RBC/hpf — ABNORMAL HIGH (ref 0–5)
Specific Gravity, Urine: 1.013 (ref 1.005–1.030)
pH: 7 (ref 5.0–8.0)

## 2021-02-17 MED ORDER — SODIUM CHLORIDE 0.9 % IV SOLN
1.0000 g | Freq: Once | INTRAVENOUS | Status: AC
  Administered 2021-02-17: 1 g via INTRAVENOUS
  Filled 2021-02-17: qty 10

## 2021-02-17 MED ORDER — IOHEXOL 350 MG/ML SOLN
100.0000 mL | Freq: Once | INTRAVENOUS | Status: AC | PRN
  Administered 2021-02-17: 100 mL via INTRAVENOUS

## 2021-02-17 MED ORDER — CEFUROXIME AXETIL 500 MG PO TABS: 500.0000 mg | ORAL_TABLET | Freq: Two times a day (BID) | ORAL | 0 refills | Status: AC

## 2021-02-17 NOTE — ED Provider Notes (Signed)
[facility] EMERGENCY DEPARTMENT Provider Note   CSN: [id number] Arrival date & time: 02/16/21  1944     History Chief Complaint  Patient presents with   Skin Ulcer    Rhonda [name] is a 60 y.o. female.  HPI  Patient presents to the ED for evaluation of a bleeding ulcer in her sacral area.  She has history of neuro sarcoidosis.  She also has history of sigmoid colectomy in April 2022 patient is wheelchair-bound.  Patient was in a nursing facility and over the last several weeks developed increasing size of this sacral wound.  Family is now caring for the patient at home.  Over the last several days they have noticed increasing bleeding.  Today when they were changing her they noticed clots of blood.  Family was concerned and brought her to the ED for evaluation.  Patient denies any abdominal pain.  No fevers.    Past Medical History:  Diagnosis Date   Arthritis    Degenerative disc disease, lumbar    GERD (gastroesophageal reflux disease)    Hypertension    OSA (obstructive sleep apnea)    not currently on CPAP   Prediabetes    Sarcoidosis of central nervous system    Seizures ([place])    Type 2    Urinary incontinence     Patient Active Problem List   Diagnosis Date Noted   Hematochezia    Diverticulosis of colon with hemorrhage    Acute blood loss anemia    Elevated liver function tests    Bowel perforation ([place]) 09/26/2020   Hypotension    Rectal bleeding    AKI (acute kidney injury) ([place])    GI bleed 08/29/2020   Neurosarcoidosis 08/06/2020   High risk medication use 08/06/2020   Weakness 08/06/2020   Type 2 diabetes mellitus without complication, with long-term current use of insulin ([place]) 08/06/2020   Acute lower GI bleeding 07/23/2020   Hyperosmolar hyperglycemic state (HHS) ([place]) 07/23/2020   Diverticulitis of large intestine with bleeding 07/23/2020   Liver lesion 07/23/2020   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   HLD (hyperlipidemia) 01/12/2020    Acute lower UTI 01/12/2020   AMS (altered mental status) 01/11/2020   Memory changes 01/08/2020   Altered mental status    Hyponatremia    Speech disturbance    Demyelinating changes in brain ([place]) 11/13/2019   Left pontine stroke ([place]) 05/23/2019   Elevated transaminase level 05/23/2019   Nausea and vomiting 05/23/2019   Leukocytosis 05/23/2019   Thoracic lymphadenopathy 08/26/2018   Axillary lymphadenopathy 08/26/2018   Piriformis syndrome of right side 09/29/2016   OSA (obstructive sleep apnea) 09/27/2015   Right hip pain 04/11/2015   Essential hypertension 11/07/2014   Chronic low back pain 11/07/2014   Prediabetes 11/07/2014   Vitamin D deficiency 11/07/2014    Past Surgical History:  Procedure Laterality Date   ABDOMINAL HYSTERECTOMY     BOWEL RESECTION N/A 09/01/2020   Procedure: SMALL BOWEL RESECTION;  Surgeon: Georganna Skeans, MD;  Location: [facility];  Service: General;  Laterality: N/A;   BREAST EXCISIONAL BIOPSY Right    BREAST SURGERY     right breast   CESAREAN SECTION     COLONOSCOPY WITH PROPOFOL N/A 07/26/2020   Procedure: COLONOSCOPY WITH PROPOFOL;  Surgeon: Carol Ada, MD;  Location: [facility];  Service: Endoscopy;  Laterality: N/A;   ESOPHAGOGASTRODUODENOSCOPY (EGD) WITH PROPOFOL N/A 07/24/2020   Procedure: ESOPHAGOGASTRODUODENOSCOPY (EGD) WITH PROPOFOL;  Surgeon: Carol Ada, MD;  Location: [facility]  ENDOSCOPY;  Service: Endoscopy;  Laterality: N/A;  Bleeding with anemia-5 gms drop in hemoglobin   GIVENS CAPSULE STUDY N/A 08/21/2020   Procedure: GIVENS CAPSULE STUDY;  Surgeon: Juanita Craver, MD;  Location: [facility] ENDOSCOPY;  Service: Endoscopy;  Laterality: N/A;   IR ANGIOGRAM FOLLOW UP STUDY  08/29/2020   IR ANGIOGRAM SELECTIVE EACH ADDITIONAL VESSEL  08/29/2020   IR ANGIOGRAM VISCERAL SELECTIVE  08/29/2020   IR ANGIOGRAM VISCERAL SELECTIVE  08/29/2020   IR EMBO ART  VEN HEMORR LYMPH EXTRAV  INC GUIDE ROADMAPPING  08/29/2020   IR US GUIDE VASC ACCESS RIGHT  08/29/2020    LAPAROTOMY N/A 09/01/2020   Procedure: EXPLORATORY LAPAROTOMY;  Surgeon: Georganna Skeans, MD;  Location: [facility];  Service: General;  Laterality: N/A;     OB History     Gravida  5   Para      Term      Preterm      AB  1   Living  4      SAB  1   IAB      Ectopic      Multiple  1   Live Births  4           Family History  Problem Relation Age of Onset   Cancer Mother    Hypertension Sister    Diabetes Sister    Diabetes Maternal Grandmother     Social History   Tobacco Use   Smoking status: Never   Smokeless tobacco: Never  Vaping Use   Vaping Use: Never used  Substance Use Topics   Alcohol use: No   Drug use: No    Home Medications Prior to Admission medications   Medication Sig Start Date End Date Taking? Authorizing Provider  apixaban (ELIQUIS) 5 MG TABS tablet Take 1 tablet (5 mg total) by mouth 2 (two) times daily. 10/04/20  Yes Debbe Odea, MD  atorvastatin (LIPITOR) 80 MG tablet Take 1 tablet (80 mg total) by mouth daily at 6 PM. 12/03/20 03/03/21 Yes Thurnell Lose, MD  b complex vitamins tablet Take 1 tablet by mouth daily.   Yes [provider]  Calcium Carb-Cholecalciferol (CALCIUM+D3 PO) Take 1 tablet by mouth daily with lunch.   Yes [provider]  cefUROXime (CEFTIN) 500 MG tablet Take 1 tablet (500 mg total) by mouth 2 (two) times daily with a meal for 7 doses. 02/17/21 02/21/21 Yes Dorie Rank, MD  FEROSUL 325 (65 Fe) MG tablet Take 325 mg by mouth daily with breakfast. 07/27/20  Yes [provider]  gabapentin (NEURONTIN) 100 MG capsule Take 100 mg by mouth 3 (three) times daily. 08/01/20  Yes [provider]  glucosamine-chondroitin 500-400 MG tablet Take 1 tablet by mouth 3 (three) times daily. Patient taking differently: Take 1 tablet by mouth in the morning and at bedtime. 04/11/15  Yes Funches, Josalyn, MD  insulin glargine (LANTUS) 100 UNIT/ML injection Inject 0.14 mLs (14 Units total) into the  skin at bedtime. Patient taking differently: Inject 15-25 Units into the skin 2 (two) times daily. 25 units in the morning and 15-20 units at night 09/28/20  Yes Rizwan, Eunice Blase, MD  Melatonin 10 MG TABS Take 10 mg by mouth at bedtime.   Yes [provider]  metoprolol tartrate (LOPRESSOR) 25 MG tablet Take 25 mg by mouth 2 (two) times daily. 11/08/20  Yes [provider]  Multiple Vitamin (MULTIVITAMIN WITH MINERALS) TABS tablet Take 1 tablet by mouth daily with lunch.  Yes [provider]  Omega-3 Fatty Acids (FISH OIL) 1000 MG CAPS Take 1,000 mg by mouth daily with lunch.    Yes [provider]  pantoprazole (PROTONIX) 40 MG tablet Take 1 tablet (40 mg total) by mouth daily. 01/17/20  Yes Regalado, Belkys A, MD  polyethylene glycol (MIRALAX / GLYCOLAX) 17 g packet Take 17 g by mouth 2 (two) times daily. 01/17/20  Yes Regalado, Belkys A, MD  predniSONE (DELTASONE) 20 MG tablet Take 40 mg by mouth See admin instructions. 8am and 2pm 07/17/20  Yes [provider]  REFRESH 1.4-0.6 % SOLN Place 1 drop into both eyes daily as needed (dry eyes). 10/18/20  Yes [provider]  silver sulfADIAZINE (SILVADENE) 1 % cream Apply 1 application topically daily. 02/07/21  Yes [provider]  amoxicillin-clavulanate (AUGMENTIN) 875-125 MG tablet Take 1 tablet by mouth every 12 (twelve) hours. Patient not taking: No sig reported 11/26/20   Thurnell Lose, MD  blood glucose meter kit and supplies KIT Dispense based on patient and insurance preference. Use up to four times daily as directed. 07/27/20   Elgergawy, Silver Huguenin, MD  docusate sodium (COLACE) 100 MG capsule Take 1 capsule (100 mg total) by mouth 2 (two) times daily. Patient not taking: No sig reported 09/28/20   Debbe Odea, MD  doxycycline (VIBRA-TABS) 100 MG tablet Take 1 tablet (100 mg total) by mouth every 12 (twelve) hours. Patient not taking: No sig reported 11/26/20   Thurnell Lose, MD   Ensure Max Protein (ENSURE MAX PROTEIN) LIQD Take 330 mLs (11 oz total) by mouth at bedtime. Patient not taking: No sig reported 09/28/20   Debbe Odea, MD  feeding supplement, GLUCERNA SHAKE, (GLUCERNA SHAKE) LIQD Take 237 mLs by mouth 2 (two) times daily between meals. Patient not taking: No sig reported 09/28/20   Debbe Odea, MD  insulin aspart (NOVOLOG) 100 UNIT/ML FlexPen Inject 1-20 Units into the skin 3 (three) times daily with meals. CBG 70 - 120: 0 units  CBG 121 - 150: 3 units  CBG 151 - 200: 4 units  CBG 201 - 250: 7 units  CBG 251 - 300: 11 units  CBG 301 - 350: 15 units  CBG 351 - 400: 20 units Patient not taking: No sig reported 09/28/20   Debbe Odea, MD  Insulin Pen Needle 29G X 5MM MISC USE WITH LANTUS 07/27/20   Elgergawy, Silver Huguenin, MD  melatonin 3 MG TABS tablet Take 1 tablet (3 mg total) by mouth at bedtime. Patient not taking: Reported on 02/17/2021 01/17/20   Regalado, Jerald Kief A, MD  ondansetron (ZOFRAN ODT) 4 MG disintegrating tablet Take 1 tablet (4 mg total) by mouth every 8 (eight) hours as needed for nausea or vomiting. Patient not taking: Reported on 02/17/2021 09/28/20   Debbe Odea, MD    Allergies    Aspirin  Review of Systems   Review of Systems  All other systems reviewed and are negative.  Physical Exam Updated Vital Signs BP (!) 149/113   Pulse 69   Temp 99.2 F (37.3 C)   Resp 19   Ht 1.6 m ($Remo'5\' 3"'Bpobx$ )   Wt 81.4 kg   SpO2 97%   BMI 31.79 kg/m   Physical Exam Vitals and nursing note reviewed.  Constitutional:      Appearance: She is well-developed. She is not diaphoretic.     Comments: Chronically ill-appearing  HENT:     Head: Normocephalic and atraumatic.     Right  Ear: External ear normal.     Left Ear: External ear normal.  Eyes:     General: No scleral icterus.       Right eye: No discharge.        Left eye: No discharge.     Conjunctiva/sclera: Conjunctivae normal.  Neck:     Trachea: No tracheal deviation.  Cardiovascular:      Rate and Rhythm: Normal rate and regular rhythm.  Pulmonary:     Effort: Pulmonary effort is normal. No respiratory distress.     Breath sounds: Normal breath sounds. No stridor. No wheezing or rales.  Abdominal:     General: Bowel sounds are normal. There is no distension.     Palpations: Abdomen is soft.     Tenderness: There is no abdominal tenderness. There is no guarding or rebound.  Genitourinary:    Comments: Large deep wound in the sacral area, fresh clots of blood noted Musculoskeletal:        General: No tenderness or deformity.     Cervical back: Neck supple.  Skin:    General: Skin is warm and dry.     Findings: No rash.  Neurological:     Mental Status: Mental status is at baseline.     Cranial Nerves: No cranial nerve deficit (no facial droop, extraocular movements intact, no slurred speech).     Motor: No abnormal muscle tone or seizure activity.     Comments: Weakness in the lower extremities,  Psychiatric:        Mood and Affect: Mood normal.    ED Results / Procedures / Treatments   Labs (all labs ordered are listed, but only abnormal results are displayed) Labs Reviewed  COMPREHENSIVE METABOLIC PANEL - Abnormal; Notable for the following components:      Result Value   Sodium 131 (*)    Potassium 3.1 (*)    Chloride 90 (*)    Glucose, Bld 231 (*)    BUN <5 (*)    Creatinine, Ser 0.41 (*)    Calcium 8.1 (*)    Total Protein 4.7 (*)    Albumin 1.7 (*)    AST 57 (*)    ALT 60 (*)    Alkaline Phosphatase 337 (*)    All other components within normal limits  CBC - Abnormal; Notable for the following components:   WBC 14.0 (*)    Hemoglobin 11.4 (*)    RDW 15.7 (*)    All other components within normal limits  URINALYSIS, ROUTINE W REFLEX MICROSCOPIC - Abnormal; Notable for the following components:   APPearance CLOUDY (*)    Hgb urine dipstick LARGE (*)    Nitrite POSITIVE (*)    Leukocytes,Ua LARGE (*)    RBC / HPF >50 (*)    Bacteria, UA  RARE (*)    All other components within normal limits  URINE CULTURE  LIPASE, BLOOD  I-STAT BETA HCG BLOOD, ED ([facility], [facility], [facility] ONLY)    EKG None  Radiology CT PELVIS W CONTRAST  Result Date: 02/17/2021 CLINICAL DATA:  Sacral wound with increased bleeding. Evaluate wound depth and for osteomyelitis EXAM: CT PELVIS WITH CONTRAST TECHNIQUE: Multidetector CT imaging of the pelvis was performed using the standard protocol following the bolus administration of intravenous contrast. CONTRAST:  162mL OMNIPAQUE IOHEXOL 350 MG/ML SOLN COMPARISON:  11/22/2020 FINDINGS: Urinary Tract:  Unremarkable Bowel: Descending colostomy. No bowel obstruction or visible inflammation where covered Vascular/Lymphatic: Mild atheromatous calcification. No acute vascular finding or  adenopathy. Reproductive:  Hysterectomy.  Negative adnexa Other:  No pelvic ascites. Musculoskeletal: Right para median to midline sacral decubitus ulcer deepest at the inferior extent where there is either internal debris or packing extending towards the anorectal junction with regional inflammation. From this point there is superiorly migrating soft tissue emphysema tracking upward along the posterior coccyx. No visible osteomyelitis or fracture. No rim enhancing fluid collection. A hematoma posterior to the left hip has organized and become low-density, 9 x 3.4 cm on axial slices. Generalized muscular atrophy. Severe lower lumbar spine degeneration where covered. IMPRESSION: 1. Sacral decubitus ulcer with regional cellulitis and soft tissue emphysema, as above. No osteomyelitis or drainable fluid collection. 2. Expected evolution of hematoma posterior to the left hip. Electronically Signed   By: Jorje Guild M.D.   On: 02/17/2021 09:32    Procedures Procedures   Medications Ordered in ED Medications  cefTRIAXone (ROCEPHIN) 1 g in sodium chloride 0.9 % 100 mL IVPB (has no administration in time range)  iohexol (OMNIPAQUE) 350 MG/ML injection 100  mL (100 mLs Intravenous Contrast Given 02/17/21 0911)    ED Course  I have reviewed the triage vital signs and the nursing notes.  Pertinent labs & imaging results that were available during my care of the patient were reviewed by me and considered in my medical decision making (see chart for details).  Clinical Course as of 02/17/21 1208  Nancy Fetter Feb 17, 2021  8099 Labs reviewed.  White blood cell count elevated at 14.  Hemoglobin decreased at 11.4, similar to previous values [JK]  0811 Electrolytes compared to previous values.  Patient has persistent hypoproteinemia and elevated LFTs.  Hyponatremia and hypokalemia also present [JK]  1124 Ua suggestive of infection vs colonization [JK]  1125 deCubitus ulcer shows evidence of cellulitis but no abscess [JK]    Clinical Course User Index [JK] Dorie Rank, MD   MDM Rules/Calculators/A&P                           Patient presented to the ED for evaluation of bleeding from a large decubitus ulcer.  No fevers or chills but I was concerned about the size of this ulcer and the possibility of deep space infection.  CT scan performed in the evidence of abscess.  Some findings concerning for the possibility of cellulitis.  Patient also has urinalysis suggest UTI.  She has been having some dysuria and apparently has frequent UTIs.  Patient's wound has been packed and she has not had any recurrent bleeding.  Discussed admitting the patient to the hospital for IV antibiotics and further treatment.  She is very anxious about being admitted.  She would prefer to go home and treat at home if possible.  Patient does have leukocytosis but no evidence of sepsis.  She is afebrile.  She is not hypotensive.  I will discharge home with a course of antibiotics.  Also recommend close follow-up with wound care center versus her surgeon to discuss further treatment options regarding her decubitus ulcer Final Clinical Impression(s) / ED Diagnoses Final diagnoses:  Protein  malnutrition ([place])  Pressure injury of sacral region, stage 3 ([place])  Lower urinary tract infectious disease    Rx / DC Orders ED Discharge Orders          Ordered    cefUROXime (CEFTIN) 500 MG tablet  2 times daily with meals        02/17/21 1205  Dorie Rank, MD 02/17/21 1208

## 2021-02-17 NOTE — Discharge Instructions (Addendum)
Continue the dressing changes.  Follow up with your doctor as planned to re evaluate the wound.  Consider evaluation at the wound center and with your surgeon.  REturn to the ED for fever, worsening symptoms

## 2021-02-19 LAB — URINE CULTURE

## 2021-02-27 ENCOUNTER — Inpatient Hospital Stay (HOSPITAL_COMMUNITY)
Admission: EM | Admit: 2021-02-27 | Discharge: 2021-03-08 | DRG: 592 | Disposition: A | Discharge status: Hospice | Payer: 59 | Attending: Internal Medicine | Admitting: Internal Medicine

## 2021-02-27 DIAGNOSIS — B370 Candidal stomatitis: Secondary | ICD-10-CM | POA: Diagnosis present

## 2021-02-27 DIAGNOSIS — L89154 Pressure ulcer of sacral region, stage 4: Principal | ICD-10-CM | POA: Diagnosis present

## 2021-02-27 DIAGNOSIS — E785 Hyperlipidemia, unspecified: Secondary | ICD-10-CM | POA: Diagnosis present

## 2021-02-27 DIAGNOSIS — J9601 Acute respiratory failure with hypoxia: Secondary | ICD-10-CM | POA: Diagnosis present

## 2021-02-27 DIAGNOSIS — Z86711 Personal history of pulmonary embolism: Secondary | ICD-10-CM

## 2021-02-27 DIAGNOSIS — Z79899 Other long term (current) drug therapy: Secondary | ICD-10-CM

## 2021-02-27 DIAGNOSIS — Z8249 Family history of ischemic heart disease and other diseases of the circulatory system: Secondary | ICD-10-CM

## 2021-02-27 DIAGNOSIS — M1990 Unspecified osteoarthritis, unspecified site: Secondary | ICD-10-CM | POA: Diagnosis present

## 2021-02-27 DIAGNOSIS — Z7401 Bed confinement status: Secondary | ICD-10-CM

## 2021-02-27 DIAGNOSIS — G4733 Obstructive sleep apnea (adult) (pediatric): Secondary | ICD-10-CM | POA: Diagnosis present

## 2021-02-27 DIAGNOSIS — E119 Type 2 diabetes mellitus without complications: Secondary | ICD-10-CM | POA: Diagnosis present

## 2021-02-27 DIAGNOSIS — Z20822 Contact with and (suspected) exposure to covid-19: Secondary | ICD-10-CM | POA: Diagnosis present

## 2021-02-27 DIAGNOSIS — Z7952 Long term (current) use of systemic steroids: Secondary | ICD-10-CM

## 2021-02-27 DIAGNOSIS — M5136 Other intervertebral disc degeneration, lumbar region: Secondary | ICD-10-CM | POA: Diagnosis present

## 2021-02-27 DIAGNOSIS — M009 Pyogenic arthritis, unspecified: Secondary | ICD-10-CM | POA: Diagnosis present

## 2021-02-27 DIAGNOSIS — Z933 Colostomy status: Secondary | ICD-10-CM

## 2021-02-27 DIAGNOSIS — L0211 Cutaneous abscess of neck: Secondary | ICD-10-CM | POA: Diagnosis present

## 2021-02-27 DIAGNOSIS — Z7189 Other specified counseling: Secondary | ICD-10-CM

## 2021-02-27 DIAGNOSIS — L89150 Pressure ulcer of sacral region, unstageable: Secondary | ICD-10-CM | POA: Diagnosis present

## 2021-02-27 DIAGNOSIS — D6959 Other secondary thrombocytopenia: Secondary | ICD-10-CM | POA: Diagnosis present

## 2021-02-27 DIAGNOSIS — Z794 Long term (current) use of insulin: Secondary | ICD-10-CM

## 2021-02-27 DIAGNOSIS — E8809 Other disorders of plasma-protein metabolism, not elsewhere classified: Secondary | ICD-10-CM | POA: Diagnosis present

## 2021-02-27 DIAGNOSIS — G9341 Metabolic encephalopathy: Secondary | ICD-10-CM | POA: Diagnosis present

## 2021-02-27 DIAGNOSIS — G40909 Epilepsy, unspecified, not intractable, without status epilepticus: Secondary | ICD-10-CM | POA: Diagnosis present

## 2021-02-27 DIAGNOSIS — L02213 Cutaneous abscess of chest wall: Secondary | ICD-10-CM | POA: Diagnosis present

## 2021-02-27 DIAGNOSIS — Z881 Allergy status to other antibiotic agents status: Secondary | ICD-10-CM

## 2021-02-27 DIAGNOSIS — Z833 Family history of diabetes mellitus: Secondary | ICD-10-CM

## 2021-02-27 DIAGNOSIS — Z809 Family history of malignant neoplasm, unspecified: Secondary | ICD-10-CM

## 2021-02-27 DIAGNOSIS — E876 Hypokalemia: Secondary | ICD-10-CM | POA: Diagnosis present

## 2021-02-27 DIAGNOSIS — R0602 Shortness of breath: Secondary | ICD-10-CM | POA: Diagnosis not present

## 2021-02-27 DIAGNOSIS — Z66 Do not resuscitate: Secondary | ICD-10-CM | POA: Diagnosis present

## 2021-02-27 DIAGNOSIS — Z886 Allergy status to analgesic agent status: Secondary | ICD-10-CM

## 2021-02-27 DIAGNOSIS — Z6831 Body mass index (BMI) 31.0-31.9, adult: Secondary | ICD-10-CM

## 2021-02-27 DIAGNOSIS — E440 Moderate protein-calorie malnutrition: Secondary | ICD-10-CM | POA: Diagnosis present

## 2021-02-27 DIAGNOSIS — D8689 Sarcoidosis of other sites: Secondary | ICD-10-CM | POA: Diagnosis present

## 2021-02-27 DIAGNOSIS — R7881 Bacteremia: Secondary | ICD-10-CM

## 2021-02-27 DIAGNOSIS — E669 Obesity, unspecified: Secondary | ICD-10-CM | POA: Diagnosis present

## 2021-02-27 DIAGNOSIS — Z515 Encounter for palliative care: Secondary | ICD-10-CM

## 2021-02-27 DIAGNOSIS — J811 Chronic pulmonary edema: Secondary | ICD-10-CM | POA: Diagnosis present

## 2021-02-27 DIAGNOSIS — K219 Gastro-esophageal reflux disease without esophagitis: Secondary | ICD-10-CM | POA: Diagnosis present

## 2021-02-27 DIAGNOSIS — A4102 Sepsis due to Methicillin resistant Staphylococcus aureus: Secondary | ICD-10-CM | POA: Diagnosis not present

## 2021-02-27 DIAGNOSIS — R6520 Severe sepsis without septic shock: Secondary | ICD-10-CM | POA: Diagnosis not present

## 2021-02-27 DIAGNOSIS — D696 Thrombocytopenia, unspecified: Secondary | ICD-10-CM

## 2021-02-27 DIAGNOSIS — I119 Hypertensive heart disease without heart failure: Secondary | ICD-10-CM | POA: Diagnosis present

## 2021-02-27 DIAGNOSIS — I10 Essential (primary) hypertension: Secondary | ICD-10-CM | POA: Diagnosis present

## 2021-02-27 DIAGNOSIS — Z7901 Long term (current) use of anticoagulants: Secondary | ICD-10-CM

## 2021-02-27 DIAGNOSIS — R1310 Dysphagia, unspecified: Secondary | ICD-10-CM | POA: Diagnosis present

## 2021-02-27 HISTORY — DX: Colostomy status: Z93.3

## 2021-02-27 NOTE — ED Triage Notes (Signed)
Pt bib EMS from home due to shortness of breath x1 week, also has complaints of intermittent chest pain. Recently treated for UTI on 10/1  Vitals: BP: 140/72 HR: 83 O2: 93% RA CBG: 93

## 2021-02-28 ENCOUNTER — Emergency Department (HOSPITAL_COMMUNITY): Payer: 59

## 2021-02-28 ENCOUNTER — Encounter (HOSPITAL_COMMUNITY): Payer: Self-pay | Admitting: Internal Medicine

## 2021-02-28 DIAGNOSIS — Z7189 Other specified counseling: Secondary | ICD-10-CM | POA: Diagnosis not present

## 2021-02-28 DIAGNOSIS — Z66 Do not resuscitate: Secondary | ICD-10-CM

## 2021-02-28 DIAGNOSIS — L89150 Pressure ulcer of sacral region, unstageable: Secondary | ICD-10-CM | POA: Diagnosis not present

## 2021-02-28 DIAGNOSIS — R7881 Bacteremia: Secondary | ICD-10-CM | POA: Diagnosis not present

## 2021-02-28 DIAGNOSIS — G4733 Obstructive sleep apnea (adult) (pediatric): Secondary | ICD-10-CM | POA: Diagnosis not present

## 2021-02-28 DIAGNOSIS — M009 Pyogenic arthritis, unspecified: Secondary | ICD-10-CM | POA: Diagnosis present

## 2021-02-28 DIAGNOSIS — D696 Thrombocytopenia, unspecified: Secondary | ICD-10-CM | POA: Diagnosis not present

## 2021-02-28 DIAGNOSIS — L02213 Cutaneous abscess of chest wall: Secondary | ICD-10-CM | POA: Diagnosis present

## 2021-02-28 DIAGNOSIS — L89154 Pressure ulcer of sacral region, stage 4: Secondary | ICD-10-CM | POA: Diagnosis present

## 2021-02-28 DIAGNOSIS — E440 Moderate protein-calorie malnutrition: Secondary | ICD-10-CM | POA: Diagnosis present

## 2021-02-28 DIAGNOSIS — G9341 Metabolic encephalopathy: Secondary | ICD-10-CM | POA: Diagnosis present

## 2021-02-28 DIAGNOSIS — E119 Type 2 diabetes mellitus without complications: Secondary | ICD-10-CM | POA: Diagnosis present

## 2021-02-28 DIAGNOSIS — E8809 Other disorders of plasma-protein metabolism, not elsewhere classified: Secondary | ICD-10-CM | POA: Diagnosis present

## 2021-02-28 DIAGNOSIS — J9601 Acute respiratory failure with hypoxia: Secondary | ICD-10-CM | POA: Diagnosis present

## 2021-02-28 DIAGNOSIS — B370 Candidal stomatitis: Secondary | ICD-10-CM | POA: Diagnosis present

## 2021-02-28 DIAGNOSIS — L0211 Cutaneous abscess of neck: Secondary | ICD-10-CM | POA: Diagnosis present

## 2021-02-28 DIAGNOSIS — I10 Essential (primary) hypertension: Secondary | ICD-10-CM | POA: Diagnosis not present

## 2021-02-28 DIAGNOSIS — I119 Hypertensive heart disease without heart failure: Secondary | ICD-10-CM | POA: Diagnosis present

## 2021-02-28 DIAGNOSIS — D8689 Sarcoidosis of other sites: Secondary | ICD-10-CM | POA: Diagnosis present

## 2021-02-28 DIAGNOSIS — Z515 Encounter for palliative care: Secondary | ICD-10-CM | POA: Diagnosis not present

## 2021-02-28 DIAGNOSIS — G40909 Epilepsy, unspecified, not intractable, without status epilepticus: Secondary | ICD-10-CM | POA: Diagnosis present

## 2021-02-28 DIAGNOSIS — E876 Hypokalemia: Secondary | ICD-10-CM | POA: Diagnosis present

## 2021-02-28 DIAGNOSIS — D6959 Other secondary thrombocytopenia: Secondary | ICD-10-CM | POA: Diagnosis present

## 2021-02-28 DIAGNOSIS — A4101 Sepsis due to Methicillin susceptible Staphylococcus aureus: Secondary | ICD-10-CM | POA: Diagnosis not present

## 2021-02-28 DIAGNOSIS — E785 Hyperlipidemia, unspecified: Secondary | ICD-10-CM | POA: Diagnosis present

## 2021-02-28 DIAGNOSIS — A4102 Sepsis due to Methicillin resistant Staphylococcus aureus: Secondary | ICD-10-CM | POA: Diagnosis not present

## 2021-02-28 DIAGNOSIS — R0602 Shortness of breath: Secondary | ICD-10-CM | POA: Diagnosis present

## 2021-02-28 DIAGNOSIS — R6520 Severe sepsis without septic shock: Secondary | ICD-10-CM | POA: Diagnosis not present

## 2021-02-28 DIAGNOSIS — B9562 Methicillin resistant Staphylococcus aureus infection as the cause of diseases classified elsewhere: Secondary | ICD-10-CM | POA: Diagnosis not present

## 2021-02-28 DIAGNOSIS — Z6831 Body mass index (BMI) 31.0-31.9, adult: Secondary | ICD-10-CM | POA: Diagnosis not present

## 2021-02-28 DIAGNOSIS — E669 Obesity, unspecified: Secondary | ICD-10-CM | POA: Diagnosis present

## 2021-02-28 DIAGNOSIS — J811 Chronic pulmonary edema: Secondary | ICD-10-CM | POA: Diagnosis present

## 2021-02-28 DIAGNOSIS — Z20822 Contact with and (suspected) exposure to covid-19: Secondary | ICD-10-CM | POA: Diagnosis present

## 2021-02-28 HISTORY — DX: Do not resuscitate: Z66

## 2021-02-28 LAB — CBC WITH DIFFERENTIAL/PLATELET
Abs Immature Granulocytes: 0.28 10*3/uL — ABNORMAL HIGH (ref 0.00–0.07)
Basophils Absolute: 0.1 10*3/uL (ref 0.0–0.1)
Basophils Relative: 0 %
Eosinophils Absolute: 0 10*3/uL (ref 0.0–0.5)
Eosinophils Relative: 0 %
HCT: 35.1 % — ABNORMAL LOW (ref 36.0–46.0)
Hemoglobin: 10.8 g/dL — ABNORMAL LOW (ref 12.0–15.0)
Immature Granulocytes: 2 %
Lymphocytes Relative: 2 %
Lymphs Abs: 0.3 10*3/uL — ABNORMAL LOW (ref 0.7–4.0)
MCH: 28.6 pg (ref 26.0–34.0)
MCHC: 30.8 g/dL (ref 30.0–36.0)
MCV: 92.9 fL (ref 80.0–100.0)
Monocytes Absolute: 0.1 10*3/uL (ref 0.1–1.0)
Monocytes Relative: 1 %
Neutro Abs: 12.3 10*3/uL — ABNORMAL HIGH (ref 1.7–7.7)
Neutrophils Relative %: 95 %
Platelets: 56 10*3/uL — ABNORMAL LOW (ref 150–400)
RBC: 3.78 MIL/uL — ABNORMAL LOW (ref 3.87–5.11)
RDW: 16.3 % — ABNORMAL HIGH (ref 11.5–15.5)
WBC Morphology: INCREASED
WBC: 13.1 10*3/uL — ABNORMAL HIGH (ref 4.0–10.5)
nRBC: 0.3 % — ABNORMAL HIGH (ref 0.0–0.2)

## 2021-02-28 LAB — BASIC METABOLIC PANEL
Anion gap: 10 (ref 5–15)
BUN: 6 mg/dL (ref 6–20)
CO2: 29 mmol/L (ref 22–32)
Calcium: 8.1 mg/dL — ABNORMAL LOW (ref 8.9–10.3)
Chloride: 88 mmol/L — ABNORMAL LOW (ref 98–111)
Creatinine, Ser: 0.5 mg/dL (ref 0.44–1.00)
GFR, Estimated: >60 mL/min (ref >60)
Glucose, Bld: 372 mg/dL — ABNORMAL HIGH (ref 70–99)
Potassium: 2.5 mmol/L — CL (ref 3.5–5.1)
Sodium: 127 mmol/L — ABNORMAL LOW (ref 135–145)

## 2021-02-28 LAB — TROPONIN I (HIGH SENSITIVITY)
Troponin I (High Sensitivity): 85 ng/L — ABNORMAL HIGH (ref <18)
Troponin I (High Sensitivity): 98 ng/L — ABNORMAL HIGH (ref <18)

## 2021-02-28 LAB — BRAIN NATRIURETIC PEPTIDE: B Natriuretic Peptide: 440.8 pg/mL — ABNORMAL HIGH (ref 0.0–100.0)

## 2021-02-28 LAB — RESP PANEL BY RT-PCR (FLU A&B, COVID) ARPGX2
Influenza A by PCR: NEGATIVE
Influenza B by PCR: NEGATIVE
SARS Coronavirus 2 by RT PCR: NEGATIVE

## 2021-02-28 LAB — GLUCOSE, CAPILLARY
Glucose-Capillary: 297 mg/dL — ABNORMAL HIGH (ref 70–99)
Glucose-Capillary: 254 mg/dL — ABNORMAL HIGH (ref 70–99)
Glucose-Capillary: 207 mg/dL — ABNORMAL HIGH (ref 70–99)

## 2021-02-28 LAB — MAGNESIUM: Magnesium: 1.7 mg/dL (ref 1.7–2.4)

## 2021-02-28 MED ORDER — FERROUS SULFATE 325 (65 FE) MG PO TABS
325.0000 mg | ORAL_TABLET | Freq: Every day | ORAL | Status: DC
  Administered 2021-02-28 – 2021-03-07 (×3): 325 mg via ORAL
  Filled 2021-02-28 (×4): qty 1

## 2021-02-28 MED ORDER — COLLAGENASE 250 UNIT/GM EX OINT
TOPICAL_OINTMENT | Freq: Two times a day (BID) | CUTANEOUS | Status: DC
  Administered 2021-03-02: 1 via TOPICAL
  Filled 2021-02-28 (×2): qty 30

## 2021-02-28 MED ORDER — POTASSIUM CHLORIDE 10 MEQ/100ML IV SOLN
10.0000 meq | Freq: Once | INTRAVENOUS | Status: AC
  Administered 2021-02-28: 10 meq via INTRAVENOUS
  Filled 2021-02-28: qty 100

## 2021-02-28 MED ORDER — GLUCERNA SHAKE PO LIQD
237.0000 mL | Freq: Two times a day (BID) | ORAL | Status: DC
  Administered 2021-03-01 – 2021-03-06 (×4): 237 mL via ORAL

## 2021-02-28 MED ORDER — ACETAMINOPHEN 650 MG RE SUPP: 650.0000 mg | Freq: Four times a day (QID) | RECTAL | Status: DC | PRN

## 2021-02-28 MED ORDER — HYDROCODONE-ACETAMINOPHEN 5-325 MG PO TABS
1.0000 | ORAL_TABLET | ORAL | Status: DC | PRN
  Administered 2021-03-03 – 2021-03-06 (×2): 2 via ORAL
  Filled 2021-02-28 (×2): qty 2

## 2021-02-28 MED ORDER — INSULIN GLARGINE-YFGN 100 UNIT/ML ~~LOC~~ SOLN
25.0000 [IU] | Freq: Every day | SUBCUTANEOUS | Status: DC
  Administered 2021-02-28: 25 [IU] via SUBCUTANEOUS
  Filled 2021-02-28 (×3): qty 0.25

## 2021-02-28 MED ORDER — SODIUM CHLORIDE 0.9 % IV BOLUS (SEPSIS)
1000.0000 mL | Freq: Once | INTRAVENOUS | Status: AC
  Administered 2021-02-28: 1000 mL via INTRAVENOUS

## 2021-02-28 MED ORDER — ATORVASTATIN CALCIUM 80 MG PO TABS
80.0000 mg | ORAL_TABLET | Freq: Every day | ORAL | Status: DC
  Administered 2021-02-28 – 2021-03-07 (×5): 80 mg via ORAL
  Filled 2021-02-28 (×6): qty 1

## 2021-02-28 MED ORDER — ONDANSETRON HCL 4 MG PO TABS: 4.0000 mg | ORAL_TABLET | Freq: Four times a day (QID) | ORAL | Status: DC | PRN

## 2021-02-28 MED ORDER — INSULIN GLARGINE-YFGN 100 UNIT/ML ~~LOC~~ SOLN
28.0000 [IU] | Freq: Every morning | SUBCUTANEOUS | Status: DC
  Administered 2021-02-28 – 2021-03-01 (×2): 28 [IU] via SUBCUTANEOUS
  Filled 2021-02-28 (×3): qty 0.28

## 2021-02-28 MED ORDER — POTASSIUM CHLORIDE 10 MEQ/100ML IV SOLN
10.0000 meq | INTRAVENOUS | Status: AC
  Administered 2021-02-28 (×5): 10 meq via INTRAVENOUS
  Filled 2021-02-28 (×5): qty 100

## 2021-02-28 MED ORDER — NYSTATIN 100000 UNIT/ML MT SUSP
5.0000 mL | Freq: Four times a day (QID) | OROMUCOSAL | Status: DC
  Administered 2021-02-28 – 2021-03-07 (×23): 500000 [IU] via ORAL
  Filled 2021-02-28 (×21): qty 5

## 2021-02-28 MED ORDER — APIXABAN 5 MG PO TABS: 5.0000 mg | ORAL_TABLET | Freq: Two times a day (BID) | ORAL | Status: DC

## 2021-02-28 MED ORDER — MELATONIN 5 MG PO TABS
10.0000 mg | ORAL_TABLET | Freq: Every day | ORAL | Status: DC
  Administered 2021-02-28 – 2021-03-07 (×4): 10 mg via ORAL
  Filled 2021-02-28 (×6): qty 2

## 2021-02-28 MED ORDER — LACTATED RINGERS IV SOLN: INTRAVENOUS | Status: DC

## 2021-02-28 MED ORDER — ALBUTEROL SULFATE (2.5 MG/3ML) 0.083% IN NEBU: 2.5000 mg | INHALATION_SOLUTION | RESPIRATORY_TRACT | Status: DC | PRN

## 2021-02-28 MED ORDER — PANTOPRAZOLE SODIUM 40 MG PO TBEC
40.0000 mg | DELAYED_RELEASE_TABLET | Freq: Every day | ORAL | Status: DC
  Administered 2021-02-28 – 2021-03-07 (×3): 40 mg via ORAL
  Filled 2021-02-28 (×4): qty 1

## 2021-02-28 MED ORDER — HYDRALAZINE HCL 20 MG/ML IJ SOLN: 5.0000 mg | INTRAMUSCULAR | Status: DC | PRN

## 2021-02-28 MED ORDER — SODIUM CHLORIDE 0.9% FLUSH
3.0000 mL | Freq: Two times a day (BID) | INTRAVENOUS | Status: DC
  Administered 2021-02-28 – 2021-03-08 (×14): 3 mL via INTRAVENOUS

## 2021-02-28 MED ORDER — PREDNISONE 20 MG PO TABS
40.0000 mg | ORAL_TABLET | Freq: Two times a day (BID) | ORAL | Status: DC
  Administered 2021-02-28 (×2): 40 mg via ORAL
  Filled 2021-02-28 (×2): qty 2

## 2021-02-28 MED ORDER — INSULIN ASPART 100 UNIT/ML IJ SOLN
0.0000 [IU] | Freq: Three times a day (TID) | INTRAMUSCULAR | Status: DC
  Administered 2021-02-28 (×2): 8 [IU] via SUBCUTANEOUS
  Administered 2021-03-01 – 2021-03-03 (×3): 2 [IU] via SUBCUTANEOUS
  Administered 2021-03-03: 3 [IU] via SUBCUTANEOUS
  Administered 2021-03-04 (×2): 2 [IU] via SUBCUTANEOUS
  Administered 2021-03-05: 8 [IU] via SUBCUTANEOUS
  Administered 2021-03-05 – 2021-03-06 (×4): 5 [IU] via SUBCUTANEOUS
  Administered 2021-03-07 (×3): 3 [IU] via SUBCUTANEOUS

## 2021-02-28 MED ORDER — ORAL CARE MOUTH RINSE
15.0000 mL | Freq: Two times a day (BID) | OROMUCOSAL | Status: DC
  Administered 2021-03-01 – 2021-03-07 (×13): 15 mL via OROMUCOSAL

## 2021-02-28 MED ORDER — CHLORHEXIDINE GLUCONATE 0.12 % MT SOLN
15.0000 mL | Freq: Two times a day (BID) | OROMUCOSAL | Status: DC
  Administered 2021-02-28 – 2021-03-08 (×12): 15 mL via OROMUCOSAL
  Filled 2021-02-28 (×9): qty 15

## 2021-02-28 MED ORDER — POLYVINYL ALCOHOL 1.4 % OP SOLN: 1.0000 [drp] | Freq: Every day | OPHTHALMIC | Status: DC | PRN

## 2021-02-28 MED ORDER — GABAPENTIN 100 MG PO CAPS
100.0000 mg | ORAL_CAPSULE | Freq: Three times a day (TID) | ORAL | Status: DC
  Administered 2021-02-28 – 2021-03-07 (×14): 100 mg via ORAL
  Filled 2021-02-28 (×18): qty 1

## 2021-02-28 MED ORDER — ADULT MULTIVITAMIN W/MINERALS CH
1.0000 | ORAL_TABLET | Freq: Every day | ORAL | Status: DC
  Administered 2021-02-28 – 2021-03-07 (×3): 1 via ORAL
  Filled 2021-02-28 (×4): qty 1

## 2021-02-28 MED ORDER — ONDANSETRON HCL 4 MG/2ML IJ SOLN
4.0000 mg | Freq: Four times a day (QID) | INTRAMUSCULAR | Status: DC | PRN
  Administered 2021-03-01 – 2021-03-08 (×3): 4 mg via INTRAVENOUS
  Filled 2021-02-28 (×3): qty 2

## 2021-02-28 MED ORDER — IOHEXOL 350 MG/ML SOLN
80.0000 mL | Freq: Once | INTRAVENOUS | Status: AC | PRN
  Administered 2021-02-28: 80 mL via INTRAVENOUS

## 2021-02-28 MED ORDER — ACETAMINOPHEN 325 MG PO TABS: 650.0000 mg | ORAL_TABLET | Freq: Four times a day (QID) | ORAL | Status: DC | PRN

## 2021-02-28 MED ORDER — INSULIN ASPART 100 UNIT/ML IJ SOLN
0.0000 [IU] | Freq: Every day | INTRAMUSCULAR | Status: DC
  Administered 2021-02-28: 2 [IU] via SUBCUTANEOUS
  Administered 2021-03-05: 3 [IU] via SUBCUTANEOUS

## 2021-02-28 MED ORDER — METOPROLOL TARTRATE 25 MG PO TABS
25.0000 mg | ORAL_TABLET | Freq: Two times a day (BID) | ORAL | Status: DC
  Administered 2021-02-28 (×2): 25 mg via ORAL
  Filled 2021-02-28 (×2): qty 1

## 2021-02-28 MED ORDER — MORPHINE SULFATE (PF) 2 MG/ML IV SOLN
2.0000 mg | INTRAVENOUS | Status: DC | PRN
  Administered 2021-03-01 – 2021-03-07 (×8): 2 mg via INTRAVENOUS
  Filled 2021-02-28 (×8): qty 1

## 2021-02-28 NOTE — ED Notes (Signed)
Lab to add on magnesium  

## 2021-02-28 NOTE — Progress Notes (Addendum)
Initial Nutrition Assessment  DOCUMENTATION CODES:   Non-severe (moderate) malnutrition in context of chronic illness  INTERVENTION -Need close monitoring of I&Os -Glucerna Shake po BID, each supplement provides 220 kcal and 10 grams of protein -snacks TID -MVI with minerals daily -Recommend adding extra salt to meal trays -Recommend checking thiamine, vitamin B12, folate B9, vitamin A, vitamin D, vitamin C, copper,selenium, iron panel,zinc (secure chat sent to MD requesting permission to order labs)  NUTRITION DIAGNOSIS:   Moderate Malnutrition related to chronic illness (neurosarcoidosis) as evidenced by mild muscle depletion, mild fat depletion.  GOAL:   Patient will meet greater than or equal to 90% of their needs  MONITOR:   PO intake, Supplement acceptance, Labs, Weight trends, I & O's, Skin  REASON FOR ASSESSMENT:   Consult Wound healing  ASSESSMENT:   Pt with PMH significant for HTN, OSA not on CPAP, pre-DM, neurosarcoidosis, h/o seizures, and recent hospitalization in 09/2020 for perforated sigmoid colon requiring emergent ex lap and Hartman's procedure w/ colostomy presenting with shortness of breath.  CT chest negative for PE but shows possible edema vs PNA. WOCN evaluated pt today and noted pt with full thickness abdominal wounds x2 and worsening stage 4 pressure injury to sacrum with large skin bridge that is necrotic over large portion of the wound. Surgical consult pending.   Pt noted to be debilitated and bedbound at baseline.  Pt currently NPO and pending SLP evaluation d/t potential for aspiration risk per MD. Discussed with SLP and with RN. Will monitor for results and provide pt with appropriate oral nutrition supplements as pt with increased nutrient needs d/t wound healing and malnutrition.   Pt's husband at bedside assisting with history. States pt has good intake at baseline and typically eats 3 balanced meals per day in addition to a snack and  strawberry Ensure protein supplements. Reports only recent change in appetite was last week d/t constipation but states appetite has returned to normal after BM. Pt endorses hunger at time of RD visit and requests coffee, fruit salad, and blueberry muffin. Discussed need for swallow evaluation prior to initiation of diet, pt and spouse express understanding. SLP entered room at end of RD visit to begin evaluation.   Pt's husband states that pt has not had any increase in output from ostomy and reports that output has been very consistent outside of brief period of constipation last week. When asked how often the colostomy bag is changed, spouse was unable to provide a solid answer and stated "it just depends" but again reports consistent and well controlled output.  Medications: ferrous sulfate, SSI w/ meals & at bedtime, semglee 28 units every morning and 25 units at bedtime, protonix, deltasone, IV KCl 65mEq every 1 hour x 6 Labs: Na 127 (L), K+ 2.5 (L), BNP 440.8 CBGs: 254-297 A1c (updated 11/22/20): 7.8   No I/O data available   Recommend checking thiamine, vitamin B12, folate B9, vitamin A, vitamin D, vitamin C, copper,selenium, zinc  NUTRITION - FOCUSED PHYSICAL EXAM:  Flowsheet Row Most Recent Value  Orbital Region No depletion  Upper Arm Region Mild depletion  Thoracic and Lumbar Region Mild depletion  Buccal Region No depletion  Temple Region Mild depletion  Clavicle Bone Region Mild depletion  Clavicle and Acromion Bone Region Mild depletion  Scapular Bone Region Mild depletion  Dorsal Hand Mild depletion  Patellar Region Mild depletion  Anterior Thigh Region Mild depletion  Posterior Calf Region Mild depletion  Edema (RD Assessment) Moderate  Hair Other (Comment)  [  hair loss]  Eyes Other (Comment)  [red, swollen]  Mouth Other (Comment)  [edentulous]  Skin Other (Comment)  [dry]  Nails Reviewed       Diet Order:   Diet Order             DIET DYS 3 Room service  appropriate? Yes; Fluid consistency: Thin  Diet effective now                   EDUCATION NEEDS:   No education needs have been identified at this time  Skin:  Skin Assessment: Skin Integrity Issues: Skin Integrity Issues:: Stage IV, Other (Comment) Stage IV: sacrum Other: abdominal wounds x2  Last BM:  PTA  Height:   Ht Readings from Last 1 Encounters:  02/27/21 5\' 3"  (1.6 m)    Weight:   Wt Readings from Last 10 Encounters:  02/27/21 80.7 kg  02/16/21 81.4 kg  11/25/20 81.4 kg  09/28/20 92.2 kg  08/06/20 79.4 kg  07/24/20 82 kg  04/29/20 77.1 kg  01/12/20 79.2 kg  01/07/20 82 kg  11/26/19 76.7 kg    BMI:  Body mass index is 31.53 kg/m.  Estimated Nutritional Needs:   Kcal:  1850-2050  Protein:  120-135 grams  Fluid:  >1.85L (monitor UOP and ostomy output)    Larkin Ina, MS, RD, LDN (she/her/hers) RD pager number and weekend/on-call pager number located in [place].

## 2021-02-28 NOTE — H&P (Signed)
History and Physical    Rhonda [name] JFH:[id number] DOB: Aug 26, 1960 DOA: 02/27/2021  PCP: [place]. Consultants:  [place] - surgery; [place] - GI; [place] - neurology; [place] - cardiology; [place] - pulmonology; [place] - nephrology Patient coming from:  Home - lives with husband; NOK: Husband, [phone]  Chief Complaint: SOB  HPI: Rhonda [name] is a 60 y.o. female with medical history significant of HTN; OSA not on CPAP; prediabetes; neurosarcoidosis; colostomy; and seizures presenting with SOB.  She has been having trouble breathing since last night and complaining of chest pain.  Symptoms started yesterday AM and progressed all day and night.  Appears to be epigastric when she points to it.  +cough, nonproductive.  She does throat clearing after drinking water.  No change in LE edema. No fever.  No sick contacts.  The pain is currently resolved.  She has a sacral pressure ulcer that her husband treats.  She was hospitalized in 09/2020 with a perforated sigmoid colon lead to emergency ex lap and Hartman's procedure.  She had delayed wound healing with wound vac placement and the abdominal wound is slowly improving over time.      ED Course: Carryover, per Dr. Alcario Drought:  h/o neurosarcoid, debilitated, bedbound.  Has sacral wound.  In today with increasing SOB.  CTA chest neg for PE but shows possible edema vs PNA.  Pt also hypokalemic.  On 2L O2.  Review of Systems: As per HPI; otherwise review of systems reviewed and negative.   Ambulatory Status:  Nonambulatory  COVID Vaccine Status:  Complete  Past Medical History:  Diagnosis Date   Arthritis    Colostomy in place [facility])    ?ischemic bowel   Degenerative disc disease, lumbar    DNR (do not resuscitate) 02/28/2021   GERD (gastroesophageal reflux disease)    Hypertension    OSA (obstructive sleep apnea)    not currently on CPAP   Prediabetes    Sarcoidosis of central nervous system    Seizures (HCC)     Type 2    Urinary incontinence     Past Surgical History:  Procedure Laterality Date   ABDOMINAL HYSTERECTOMY     BOWEL RESECTION N/A 09/01/2020   Procedure: SMALL BOWEL RESECTION;  Surgeon: Georganna Skeans, MD;  Location: [place];  Service: General;  Laterality: N/A;   BREAST EXCISIONAL BIOPSY Right    BREAST SURGERY     right breast   CESAREAN SECTION     COLONOSCOPY WITH PROPOFOL N/A 07/26/2020   Procedure: COLONOSCOPY WITH PROPOFOL;  Surgeon: Carol Ada, MD;  Location: [place];  Service: Endoscopy;  Laterality: N/A;   ESOPHAGOGASTRODUODENOSCOPY (EGD) WITH PROPOFOL N/A 07/24/2020   Procedure: ESOPHAGOGASTRODUODENOSCOPY (EGD) WITH PROPOFOL;  Surgeon: Carol Ada, MD;  Location: [place];  Service: Endoscopy;  Laterality: N/A;  Bleeding with anemia-5 gms drop in hemoglobin   GIVENS CAPSULE STUDY N/A 08/21/2020   Procedure: GIVENS CAPSULE STUDY;  Surgeon: Juanita Craver, MD;  Location[facility] ENDOSCOPY;  Service: Endoscopy;  Laterality: N/A;   IR ANGIOGRAM FOLLOW UP STUDY  08/29/2020   IR ANGIOGRAM SELECTIVE EACH ADDITIONAL VESSEL  08/29/2020   IR ANGIOGRAM VISCERAL SELECTIVE  08/29/2020   IR ANGIOGRAM VISCERAL SELECTIVE  08/29/2020   IR EMBO ART  VEN HEMORR LYMPH EXTRAV  INC GUIDE ROADMAPPING  08/29/2020   IR US GUIDE VASC ACCESS RIGHT  08/29/2020   LAPAROTOMY N/A 09/01/2020   Procedure: EXPLORATORY LAPAROTOMY;  Surgeon: Georganna Skeans, MD;  Location: [place];  Service:  General;  Laterality: N/A;    Social History   Socioeconomic History   Marital status: Married    Spouse name: Not on file   Number of children: 4   Years of education: 12   Highest education level: Not on file  Occupational History   Occupation: disabled  Tobacco Use   Smoking status: Never   Smokeless tobacco: Never  Vaping Use   Vaping Use: Never used  Substance and Sexual Activity   Alcohol use: No   Drug use: No   Sexual activity: Yes    Birth control/protection: None  Other Topics Concern   Not on file   Social History Narrative   Right handed   Tea sometimes, hot chocolate    Social Determinants of Health   Financial Resource Strain: Not on file  Food Insecurity: Not on file  Transportation Needs: Not on file  Physical Activity: Not on file  Stress: Not on file  Social Connections: Not on file  Intimate Partner Violence: Not on file    Allergies  Allergen Reactions   Aspirin Swelling   Augmentin [Amoxicillin-Pot Clavulanate] Swelling and Other (See Comments)    **patient has tolerated piperacillin/tazobactam, cephalexin, and ceftriaxone Hand swelled at IV site - likely extravasation    Family History  Problem Relation Age of Onset   Cancer Mother    Hypertension Sister    Diabetes Sister    Diabetes Maternal Grandmother     Prior to Admission medications   Medication Sig Start Date End Date Taking? Authorizing Provider  acetaminophen (TYLENOL) 500 MG tablet Take 1,000 mg by mouth every 6 (six) hours as needed for moderate pain or headache.   Yes [provider]  apixaban (ELIQUIS) 5 MG TABS tablet Take 1 tablet (5 mg total) by mouth 2 (two) times daily. 10/04/20  Yes Debbe Odea, MD  atorvastatin (LIPITOR) 80 MG tablet Take 1 tablet (80 mg total) by mouth daily at 6 PM. 12/03/20 03/03/21 Yes Thurnell Lose, MD  b complex vitamins tablet Take 1 tablet by mouth daily.   Yes [provider]  blood glucose meter kit and supplies KIT Dispense based on patient and insurance preference. Use up to four times daily as directed. 07/27/20  Yes Elgergawy, Silver Huguenin, MD  Calcium Carb-Cholecalciferol (CALCIUM+D3 PO) Take 1 tablet by mouth daily with lunch.   Yes [provider]  Ensure Max Protein (ENSURE MAX PROTEIN) LIQD Take 330 mLs (11 oz total) by mouth at bedtime. Patient taking differently: Take 11 oz by mouth daily as needed (if not eating). 09/28/20  Yes Debbe Odea, MD  FEROSUL 325 (65 Fe) MG tablet Take 325 mg by mouth daily with breakfast.  07/27/20  Yes [provider]  gabapentin (NEURONTIN) 100 MG capsule Take 100 mg by mouth 3 (three) times daily. 08/01/20  Yes [provider]  glucosamine-chondroitin 500-400 MG tablet Take 1 tablet by mouth 3 (three) times daily. Patient taking differently: Take 1 tablet by mouth in the morning and at bedtime. 04/11/15  Yes Funches, Josalyn, MD  insulin aspart (NOVOLOG) 100 UNIT/ML FlexPen Inject 1-20 Units into the skin 3 (three) times daily with meals. CBG 70 - 120: 0 units  CBG 121 - 150: 3 units  CBG 151 - 200: 4 units  CBG 201 - 250: 7 units  CBG 251 - 300: 11 units  CBG 301 - 350: 15 units  CBG 351 - 400: 20 units 09/28/20  Yes Debbe Odea, MD  insulin glargine (  LANTUS) 100 UNIT/ML injection Inject 0.14 mLs (14 Units total) into the skin at bedtime. Patient taking differently: Inject 25-28 Units into the skin 2 (two) times daily. 28 units in the morning 25 units at bedtime 09/28/20  Yes Rizwan, Eunice Blase, MD  Insulin Pen Needle 29G X 5MM MISC USE WITH LANTUS 07/27/20  Yes Elgergawy, Silver Huguenin, MD  Melatonin 10 MG TABS Take 10 mg by mouth at bedtime.   Yes [provider]  metoprolol tartrate (LOPRESSOR) 25 MG tablet Take 25 mg by mouth 2 (two) times daily. 11/08/20  Yes [provider]  Multiple Vitamin (MULTIVITAMIN WITH MINERALS) TABS tablet Take 1 tablet by mouth daily with lunch.    Yes [provider]  Omega-3 Fatty Acids (FISH OIL) 1000 MG CAPS Take 1,000 mg by mouth daily with lunch.    Yes [provider]  ondansetron (ZOFRAN ODT) 4 MG disintegrating tablet Take 1 tablet (4 mg total) by mouth every 8 (eight) hours as needed for nausea or vomiting. 09/28/20  Yes Debbe Odea, MD  pantoprazole (PROTONIX) 40 MG tablet Take 1 tablet (40 mg total) by mouth daily. 01/17/20  Yes Regalado, Belkys A, MD  polyethylene glycol (MIRALAX / GLYCOLAX) 17 g packet Take 17 g by mouth 2 (two) times daily. Patient taking differently: Take 17 g by mouth  daily as needed for mild constipation. 01/17/20  Yes Regalado, Belkys A, MD  predniSONE (DELTASONE) 20 MG tablet Take 40 mg by mouth 2 (two) times daily with a meal. 07/17/20  Yes [provider]  REFRESH 1.4-0.6 % SOLN Place 1 drop into both eyes daily as needed (dry eyes). 10/18/20  Yes [provider]  silver sulfADIAZINE (SILVADENE) 1 % cream Apply 1 application topically daily as needed (wound care). 02/07/21  Yes [provider]  amoxicillin-clavulanate (AUGMENTIN) 875-125 MG tablet Take 1 tablet by mouth every 12 (twelve) hours. Patient not taking: No sig reported 11/26/20   Thurnell Lose, MD  docusate sodium (COLACE) 100 MG capsule Take 1 capsule (100 mg total) by mouth 2 (two) times daily. Patient not taking: No sig reported 09/28/20   Debbe Odea, MD  doxycycline (VIBRA-TABS) 100 MG tablet Take 1 tablet (100 mg total) by mouth every 12 (twelve) hours. Patient not taking: No sig reported 11/26/20   Thurnell Lose, MD  feeding supplement, GLUCERNA SHAKE, (GLUCERNA SHAKE) LIQD Take 237 mLs by mouth 2 (two) times daily between meals. Patient not taking: No sig reported 09/28/20   Debbe Odea, MD  melatonin 3 MG TABS tablet Take 1 tablet (3 mg total) by mouth at bedtime. Patient not taking: No sig reported 01/17/20   Elmarie Shiley, MD    Physical Exam: Vitals:   02/28/21 0330 02/28/21 0510 02/28/21 0600 02/28/21 0700  BP: (!) 142/96 (!) 134/94 (!) 130/102 (!) 127/95  Pulse: 94 98 (!) 102 (!) 102  Resp: (!) 27 (!) 24 (!) 29 (!) 28  Temp:      TempSrc:      SpO2: 100% 100% 99% 100%  Weight:      Height:         General:  Appears chronically ill, frail Eyes:  EOMI, normal lids, iris ENT:  grossly normal hearing, lips, apparent thrush on tongue, dry mm Neck:  no LAD, masses or thyromegaly Cardiovascular:  RR with tachycardia. 1-2+ LE edema.  Respiratory:   CTA bilaterally with no wheezes/rales/rhonchi.  Increased respiratory effort. Abdomen:   midline incision healing with secondary intention; +  colostomy    Skin:  sacral pressure ulcer    Musculoskeletal:  decreased tone BUE/BLE, no bony abnormality Lower extremity:  1-2+ LE edema.  Limited foot exam with no ulcerations.  2+ distal pulses. Psychiatric:  flat mood and affect, speech sparse but appropriate    Radiological Exams on Admission: Independently reviewed - see discussion in A/P where applicable  CT Angio Chest PE W and/or Wo Contrast  Result Date: 02/28/2021 CLINICAL DATA:  Shortness of breath. EXAM: CT ANGIOGRAPHY CHEST WITH CONTRAST TECHNIQUE: Multidetector CT imaging of the chest was performed using the standard protocol during bolus administration of intravenous contrast. Multiplanar CT image reconstructions and MIPs were obtained to evaluate the vascular anatomy. CONTRAST:  66m OMNIPAQUE IOHEXOL 350 MG/ML SOLN COMPARISON:  CT scan 09/21/2020 FINDINGS: Cardiovascular: The heart is mildly enlarged but stable. No pericardial effusion. Stable tortuosity and mild ectasia of the thoracic aorta but no aneurysm or dissection. Minimal atherosclerotic calcification at the aortic arch. The branch vessels are patent. No definite coronary artery calcifications. The pulmonary arterial tree is well opacified. Small bandlike filling defect noted in the proximal right upper lobe pulmonary artery in an area of a prior pulmonary embolus consistent with a fibrotic band. No findings suspicious for acute PE. Mediastinum/Nodes: There is a stable fluid collection noted near the left atrium and inferior pulmonary vein on the right side. This may be some type of epicardial cyst. No mediastinal or hilar adenopathy. The esophagus is grossly. Lungs/Pleura: Chronic appearing patchy mosaic pattern of ground-glass attenuation could be asymmetric pulmonary edema, small airways disease such as asthma or respiratory bronchiolitis. Other possibilities would include cryptogenic organizing pneumonia or  hypersensitivity pneumonitis. There are also small bilateral pleural effusions and overlying atelectasis. No worrisome pulmonary lesions. Upper Abdomen: No significant upper abdominal findings. Musculoskeletal: No breast masses are identified. There is a complex appearing fluid collection containing gas involving the right chest wall anteriorly. The upper part of this abscess is anterior to the thyroid gland and it appears it may communicate with a second lower abscess near the right sternoclavicular joint and anterior to the right clavicular head. Do not see any definite CT findings for septic arthritis at the right Arnold joint that would be a possibility. This measures a maximum of 7.5 cm. Aspiration may be helpful for diagnostic and therapeutic purposes. MRI examination of the chest wall may be helpful for further evaluation. Review of the MIP images confirms the above findings. IMPRESSION: 1. Small bandlike filling defect in the proximal right upper lobe pulmonary artery in an area of a prior pulmonary embolus consistent with a fibrotic band. No findings suspicious for acute PE. 2. Stable tortuosity and mild ectasia of the thoracic aorta but no aneurysm or dissection. 3. Chronic appearing patchy mosaic pattern of ground-glass attenuation could be asymmetric pulmonary edema, small airways disease such as asthma or respiratory bronchiolitis. Other possibilities would include cryptogenic organizing pneumonia or hypersensitivity pneumonitis. 4. Small bilateral pleural effusions and overlying atelectasis. 5. 7.5 cm anterior chest wall abscess of uncertain origin. Right sternoclavicular joint septic arthritis would be a possibility although I do not see any definite CT evidence of this. MRI may be helpful for further evaluation. Electronically Signed   By: PMarijo SanesM.D.   On: 02/28/2021 05:47   DG Chest Port 1 View  Result Date: 02/28/2021 CLINICAL DATA:  Shortness of breath for 1 week.  Chest pain. EXAM:  PORTABLE CHEST 1 VIEW COMPARISON:  CT 09/21/2020 FINDINGS: Shallow inspiration. Mild cardiac enlargement. No  vascular congestion, edema, or consolidation. Prominence of the mediastinum is unchanged since prior study and correlates to a combination of prominent mediastinal fat and lymphadenopathy. No airspace disease or consolidation in the lungs. IMPRESSION: Shallow inspiration. Cardiac enlargement. Prominent mediastinal structures correspond to mediastinal fat and lymphadenopathy on previous CT. No active pulmonary disease. Electronically Signed   By: Lucienne Capers M.D.   On: 02/28/2021 00:38    EKG: Independently reviewed.  NSR with rate 95; PVCs, LVH with no evidence of acute ischemia   Labs on Admission: I have personally reviewed the available labs and imaging studies at the time of the admission.  Pertinent labs:   Na++ 127 K+ 2.5 Glucose 372 Calcium 8.1 BNP 440.8 WBC 13.1 Hgb 10.8 Platelets 56 COVID/flu negative   Assessment/Plan Principal Problem:   Shortness of breath Active Problems:   Essential hypertension   OSA (obstructive sleep apnea)   HLD (hyperlipidemia)   Neurosarcoidosis   Type 2 diabetes mellitus without complication, with long-term current use of insulin (HCC)   Unstageable pressure ulcer of sacral region ([place])   Hypokalemia   DNR (do not resuscitate)   SOB/CP -Will check troponin -Non-ischemic appearing EKG -Mildly elevated BNP with ?asymmetric pulmonary edema on CXR; hold IVF -Currently on RA -Her pain appears more epigastric in nature, although she did not have TTP -Also with cough after eating -Will keep NPO pending ST evaluation, as aspiration is another consideration  Sacral pressure ulcer -Wound care consult -Surgery consult  Hypokalemia -K+ 2.5 -She was given 20 mEq in the ER -Will give and additional 6 runs of KCl  Uncontrolled DM -Recent A1c was 7.8 -Continue Lantus -Cover with moderate-scale SSI  -Continue  Neurontin  Neurosarcoidosis -Non-ambulatory -Continue prednisone - although this may hinder wound healing -Palliative care consult  OSA -Not on CPAP  H/o PE -Currently with thrombocytopenia so will hold Eliquis  HTN -Continue Lopressor  HLD -Continue Lipitor -Hold fish oil due to limited inpatient utility  [place]rush -Nystatin  Obesity -Body mass index is 31.53 kg/m..  -This may hinder her wound healing  DNR -I have discussed code status with the patient and her husband and  they are in agreement that the patient would not desire resuscitation and would prefer to die a natural death should that situation arise. -She will need a gold out of facility DNR form at the time of discharge     Note: This patient has been tested and is negative for the novel coronavirus COVID-19. The patient has been fully vaccinated against COVID-19.   Level of care: Telemetry Medical DVT prophylaxis: SCDs Code Status:  DNR - confirmed with patient/family Family Communication: Husband was present throughout evaluation. Disposition Plan:  The patient is from: home  Anticipated d/c is to: home, possibly with [facility] services  Anticipated d/c date will depend on clinical response to treatment, likely 2-3 days  Patient is currently: acutely ill Consults called: Palliative care; surgery; nutrition; ST  Admission status:  Admit - It is my clinical opinion that admission to [facility] is reasonable and necessary because of the expectation that this patient will require hospital care that crosses at least 2 midnights to treat this condition based on the medical complexity of the problems presented.  Given the aforementioned information, the predictability of an adverse outcome is felt to be significant.    Karmen Bongo MD [facility]   How to contact the [facility] Attending or Consulting provider [place] or covering provider during after hours [place], for  this patient?  Check the care team in [facility] and  look for a) attending/consulting [facility] provider listed and b) the [facility] team listed Log into [email or web address] and use [place]'s universal password to access. If you do not have the password, please contact the hospital operator. Locate the [facility] provider you are looking for under [facility] and page to a number that you can be directly reached. If you still have difficulty reaching the provider, please page the [facility] (Director on Call) for the Hospitalists listed on amion for assistance.   02/28/2021, 9:15 AM

## 2021-02-28 NOTE — Consult Note (Signed)
[place] Nurse wound consult note Consultation was completed by review of records, images and assistance from the bedside nurse/clinical staff.   Discussed patient's sacral wound with admitting MD; agreed surgical consult warranted  due to worsening sacral pressure injury and skin bridge over wound bed that needs to be removed to encourage wound healing.   Reason for Consult: abdominal wound, colostomy, sacral pressure injury Wound type: Full thickness abdominal wound; 100% granulation but pale Full thickness abdominal wound distal; 100% hypergranulation tissue, pink, moist Stage 4 Pressure injury; pink wound bed visible in images reviewed; large skin bridge that is necrotic over large portion of wound Colostomy; LUQ; patient known to [facility] [place] nursing team from previous admission  Pressure Injury POA: Yes Measurement: See nursing flow sheets Wound bed: see above  Drainage (amount, consistency, odor) sacral wound with moderate serosanguineous drainage  Periwound: intact other than described skin bridge  Dressing procedure/placement/frequency:  Pending surgical consult; will not enter wound care orders for sacrum or abdominal wound at this time; communicating with [facility] related to patient's wound care needs Add low air loss mattress for moisture management and pressure redistribution  Add ostomy care orders; supply order with [name] numbers provided Add RD consult for nutritional supplementation for wound healing    Re consult if needed, will not follow at this time. Thanks  Da Michelle [employer], RN,CWOCN, CNS, [place] [phone])

## 2021-02-28 NOTE — ED Provider Notes (Signed)
[facility] EMERGENCY DEPARTMENT Provider Note   CSN: [id number] Arrival date & time: 02/27/21  2345     History Chief Complaint - shortness of breath   Rhonda [name] is a 60 y.o. female.  The history is provided by the patient and the spouse.  Shortness of Breath Severity:  Moderate Onset quality:  Gradual Duration:  2 days Timing:  Intermittent Progression:  Worsening Chronicity:  New Relieved by:  Nothing Worsened by:  Nothing Associated symptoms: no chest pain, no cough, no fever and no vomiting   Patient with extensive history including neurosarcoidosis, hypertension, pulmonary embolism on anticoagulation, previous history of perforated diverticulitis presents with shortness of breath.  Husband reports the patient's had increasing shortness of breath for the past 2 days.  No fevers or vomiting.  No active chest pain.  No new cough.  Patient has known sacral wound.  This is managed at home.  She is not on chronic oxygen    Past Medical History:  Diagnosis Date   Arthritis    Degenerative disc disease, lumbar    GERD (gastroesophageal reflux disease)    Hypertension    OSA (obstructive sleep apnea)    not currently on CPAP   Prediabetes    Sarcoidosis of central nervous system    Seizures ([place])    Type 2    Urinary incontinence     Patient Active Problem List   Diagnosis Date Noted   Hematochezia    Diverticulosis of colon with hemorrhage    Acute blood loss anemia    Elevated liver function tests    Bowel perforation ([place]) 09/26/2020   Hypotension    Rectal bleeding    AKI (acute kidney injury) ([place])    GI bleed 08/29/2020   Neurosarcoidosis 08/06/2020   High risk medication use 08/06/2020   Weakness 08/06/2020   Type 2 diabetes mellitus without complication, with long-term current use of insulin ([place]) 08/06/2020   Acute lower GI bleeding 07/23/2020   Hyperosmolar hyperglycemic state (HHS) ([place]) 07/23/2020   Diverticulitis of large  intestine with bleeding 07/23/2020   Liver lesion 07/23/2020   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   HLD (hyperlipidemia) 01/12/2020   Acute lower UTI 01/12/2020   AMS (altered mental status) 01/11/2020   Memory changes 01/08/2020   Altered mental status    Hyponatremia    Speech disturbance    Demyelinating changes in brain ([place]) 11/13/2019   Left pontine stroke ([place]) 05/23/2019   Elevated transaminase level 05/23/2019   Nausea and vomiting 05/23/2019   Leukocytosis 05/23/2019   Thoracic lymphadenopathy 08/26/2018   Axillary lymphadenopathy 08/26/2018   Piriformis syndrome of right side 09/29/2016   OSA (obstructive sleep apnea) 09/27/2015   Right hip pain 04/11/2015   Essential hypertension 11/07/2014   Chronic low back pain 11/07/2014   Prediabetes 11/07/2014   Vitamin D deficiency 11/07/2014    Past Surgical History:  Procedure Laterality Date   ABDOMINAL HYSTERECTOMY     BOWEL RESECTION N/A 09/01/2020   Procedure: SMALL BOWEL RESECTION;  Surgeon: Georganna Skeans, MD;  Location: [place];  Service: General;  Laterality: N/A;   BREAST EXCISIONAL BIOPSY Right    BREAST SURGERY     right breast   CESAREAN SECTION     COLONOSCOPY WITH PROPOFOL N/A 07/26/2020   Procedure: COLONOSCOPY WITH PROPOFOL;  Surgeon: Carol Ada, MD;  Location: [facility];  Service: Endoscopy;  Laterality: N/A;   ESOPHAGOGASTRODUODENOSCOPY (EGD) WITH PROPOFOL N/A 07/24/2020   Procedure: ESOPHAGOGASTRODUODENOSCOPY (EGD) WITH PROPOFOL;  Surgeon: Carol Ada, MD;  Location: [facility];  Service: Endoscopy;  Laterality: N/A;  Bleeding with anemia-5 gms drop in hemoglobin   GIVENS CAPSULE STUDY N/A 08/21/2020   Procedure: GIVENS CAPSULE STUDY;  Surgeon: Juanita Craver, MD;  Location: [facility] ENDOSCOPY;  Service: Endoscopy;  Laterality: N/A;   IR ANGIOGRAM FOLLOW UP STUDY  08/29/2020   IR ANGIOGRAM SELECTIVE EACH ADDITIONAL VESSEL  08/29/2020   IR ANGIOGRAM VISCERAL SELECTIVE  08/29/2020   IR ANGIOGRAM VISCERAL SELECTIVE   08/29/2020   IR EMBO ART  VEN HEMORR LYMPH EXTRAV  INC GUIDE ROADMAPPING  08/29/2020   IR US GUIDE VASC ACCESS RIGHT  08/29/2020   LAPAROTOMY N/A 09/01/2020   Procedure: EXPLORATORY LAPAROTOMY;  Surgeon: Georganna Skeans, MD;  Location: [facility];  Service: General;  Laterality: N/A;     OB History     Gravida  5   Para      Term      Preterm      AB  1   Living  4      SAB  1   IAB      Ectopic      Multiple  1   Live Births  4           Family History  Problem Relation Age of Onset   Cancer Mother    Hypertension Sister    Diabetes Sister    Diabetes Maternal Grandmother     Social History   Tobacco Use   Smoking status: Never   Smokeless tobacco: Never  Vaping Use   Vaping Use: Never used  Substance Use Topics   Alcohol use: No   Drug use: No    Home Medications Prior to Admission medications   Medication Sig Start Date End Date Taking? Authorizing Provider  acetaminophen (TYLENOL) 500 MG tablet Take 1,000 mg by mouth every 6 (six) hours as needed for moderate pain or headache.   Yes [provider]  apixaban (ELIQUIS) 5 MG TABS tablet Take 1 tablet (5 mg total) by mouth 2 (two) times daily. 10/04/20  Yes Debbe Odea, MD  atorvastatin (LIPITOR) 80 MG tablet Take 1 tablet (80 mg total) by mouth daily at 6 PM. 12/03/20 03/03/21 Yes Thurnell Lose, MD  b complex vitamins tablet Take 1 tablet by mouth daily.   Yes [provider]  blood glucose meter kit and supplies KIT Dispense based on patient and insurance preference. Use up to four times daily as directed. 07/27/20  Yes Elgergawy, Silver Huguenin, MD  Calcium Carb-Cholecalciferol (CALCIUM+D3 PO) Take 1 tablet by mouth daily with lunch.   Yes [provider]  Ensure Max Protein (ENSURE MAX PROTEIN) LIQD Take 330 mLs (11 oz total) by mouth at bedtime. Patient taking differently: Take 11 oz by mouth daily as needed (if not eating). 09/28/20  Yes Debbe Odea, MD  FEROSUL 325 (65 Fe)  MG tablet Take 325 mg by mouth daily with breakfast. 07/27/20  Yes [provider]  gabapentin (NEURONTIN) 100 MG capsule Take 100 mg by mouth 3 (three) times daily. 08/01/20  Yes [provider]  glucosamine-chondroitin 500-400 MG tablet Take 1 tablet by mouth 3 (three) times daily. Patient taking differently: Take 1 tablet by mouth in the morning and at bedtime. 04/11/15  Yes Funches, Josalyn, MD  insulin aspart (NOVOLOG) 100 UNIT/ML FlexPen Inject 1-20 Units into the skin 3 (three) times daily with meals. CBG 70 - 120: 0 units  CBG 121 - 150: 3  units  CBG 151 - 200: 4 units  CBG 201 - 250: 7 units  CBG 251 - 300: 11 units  CBG 301 - 350: 15 units  CBG 351 - 400: 20 units 09/28/20  Yes Rizwan, Eunice Blase, MD  insulin glargine (LANTUS) 100 UNIT/ML injection Inject 0.14 mLs (14 Units total) into the skin at bedtime. Patient taking differently: Inject 25-28 Units into the skin 2 (two) times daily. 28 units in the morning 25 units at bedtime 09/28/20  Yes Rizwan, Eunice Blase, MD  Insulin Pen Needle 29G X 5MM MISC USE WITH LANTUS 07/27/20  Yes Elgergawy, Silver Huguenin, MD  Melatonin 10 MG TABS Take 10 mg by mouth at bedtime.   Yes [provider]  metoprolol tartrate (LOPRESSOR) 25 MG tablet Take 25 mg by mouth 2 (two) times daily. 11/08/20  Yes [provider]  Multiple Vitamin (MULTIVITAMIN WITH MINERALS) TABS tablet Take 1 tablet by mouth daily with lunch.    Yes [provider]  Omega-3 Fatty Acids (FISH OIL) 1000 MG CAPS Take 1,000 mg by mouth daily with lunch.    Yes [provider]  ondansetron (ZOFRAN ODT) 4 MG disintegrating tablet Take 1 tablet (4 mg total) by mouth every 8 (eight) hours as needed for nausea or vomiting. 09/28/20  Yes Debbe Odea, MD  pantoprazole (PROTONIX) 40 MG tablet Take 1 tablet (40 mg total) by mouth daily. 01/17/20  Yes Regalado, Belkys A, MD  polyethylene glycol (MIRALAX / GLYCOLAX) 17 g packet Take 17 g by mouth 2 (two) times  daily. Patient taking differently: Take 17 g by mouth daily as needed for mild constipation. 01/17/20  Yes Regalado, Belkys A, MD  predniSONE (DELTASONE) 20 MG tablet Take 40 mg by mouth 2 (two) times daily with a meal. 07/17/20  Yes [provider]  REFRESH 1.4-0.6 % SOLN Place 1 drop into both eyes daily as needed (dry eyes). 10/18/20  Yes [provider]  silver sulfADIAZINE (SILVADENE) 1 % cream Apply 1 application topically daily as needed (wound care). 02/07/21  Yes [provider]  amoxicillin-clavulanate (AUGMENTIN) 875-125 MG tablet Take 1 tablet by mouth every 12 (twelve) hours. Patient not taking: No sig reported 11/26/20   Thurnell Lose, MD  docusate sodium (COLACE) 100 MG capsule Take 1 capsule (100 mg total) by mouth 2 (two) times daily. Patient not taking: No sig reported 09/28/20   Debbe Odea, MD  doxycycline (VIBRA-TABS) 100 MG tablet Take 1 tablet (100 mg total) by mouth every 12 (twelve) hours. Patient not taking: No sig reported 11/26/20   Thurnell Lose, MD  feeding supplement, GLUCERNA SHAKE, (GLUCERNA SHAKE) LIQD Take 237 mLs by mouth 2 (two) times daily between meals. Patient not taking: No sig reported 09/28/20   Debbe Odea, MD  melatonin 3 MG TABS tablet Take 1 tablet (3 mg total) by mouth at bedtime. Patient not taking: No sig reported 01/17/20   Regalado, Belkys A, MD    Allergies    Aspirin and Augmentin [amoxicillin-pot clavulanate]  Review of Systems   Review of Systems  Constitutional:  Negative for fever.  Respiratory:  Positive for shortness of breath. Negative for cough.   Cardiovascular:  Negative for chest pain.  Gastrointestinal:  Negative for blood in stool and vomiting.  All other systems reviewed and are negative.  Physical Exam Updated Vital Signs BP (!) 134/94   Pulse 98   Temp 98.8 F (37.1 C) (Oral)   Resp (!) 24   Ht 1.6 m (  $'5\' 3"'W$ )   Wt 80.7 kg   SpO2 100%   BMI 31.53 kg/m   Physical  Exam CONSTITUTIONAL: Chronically ill-appearing HEAD: Normocephalic/atraumatic EYES: EOMI/PERRL ENMT: Mucous membranes moist NECK: supple no meningeal signs SPINE/BACK:entire spine nontender CV: S1/S2 noted, no murmurs/rubs/gallops noted LUNGS: Mild tachypnea, decreased breath sounds bilaterally.  Patient is currently on 2 L nasal cannula ABDOMEN: soft, protuberant.  Colostomy in place.  Bandage noted in the midline. GU:no cva tenderness NEURO: Pt is awake/alert/appropriate EXTREMITIES: pulses normal/equal, distal pulses intact.  No wounds noted to the feet. SKIN: warm, large sacral wound is noted that has packing in place.  Wound edges are pink for sacral wound. Swelling and mild tenderness noted right clavicle. PSYCH: no abnormalities of mood noted, alert and oriented to situation  ED Results / Procedures / Treatments   Labs (all labs ordered are listed, but only abnormal results are displayed) Labs Reviewed  BASIC METABOLIC PANEL - Abnormal; Notable for the following components:      Result Value   Sodium 127 (*)    Potassium 2.5 (*)    Chloride 88 (*)    Glucose, Bld 372 (*)    Calcium 8.1 (*)    All other components within normal limits  CBC WITH DIFFERENTIAL/PLATELET - Abnormal; Notable for the following components:   WBC 13.1 (*)    RBC 3.78 (*)    Hemoglobin 10.8 (*)    HCT 35.1 (*)    RDW 16.3 (*)    Platelets 56 (*)    nRBC 0.3 (*)    Neutro Abs 12.3 (*)    Lymphs Abs 0.3 (*)    Abs Immature Granulocytes 0.28 (*)    All other components within normal limits  BRAIN NATRIURETIC PEPTIDE - Abnormal; Notable for the following components:   B Natriuretic Peptide 440.8 (*)    All other components within normal limits  RESP PANEL BY RT-PCR (FLU A&B, COVID) ARPGX2  MAGNESIUM    EKG EKG Interpretation  Date/Time:  Thursday February 28 2021 00:29:42 EDT Ventricular Rate:  95 PR Interval:  121 QRS Duration: 97 QT Interval:  383 QTC Calculation: 482 R  Axis:   -27 Text Interpretation: Sinus tachycardia Multiple premature complexes, vent & supraven Consider right atrial enlargement Left ventricular hypertrophy Anterior Q waves, possibly due to LVH Confirmed by Ripley Fraise [phone]) on 02/28/2021 12:48:07 AM  Radiology CT Angio Chest PE W and/or Wo Contrast  Result Date: 02/28/2021 CLINICAL DATA:  Shortness of breath. EXAM: CT ANGIOGRAPHY CHEST WITH CONTRAST TECHNIQUE: Multidetector CT imaging of the chest was performed using the standard protocol during bolus administration of intravenous contrast. Multiplanar CT image reconstructions and MIPs were obtained to evaluate the vascular anatomy. CONTRAST:  26mL OMNIPAQUE IOHEXOL 350 MG/ML SOLN COMPARISON:  CT scan 09/21/2020 FINDINGS: Cardiovascular: The heart is mildly enlarged but stable. No pericardial effusion. Stable tortuosity and mild ectasia of the thoracic aorta but no aneurysm or dissection. Minimal atherosclerotic calcification at the aortic arch. The branch vessels are patent. No definite coronary artery calcifications. The pulmonary arterial tree is well opacified. Small bandlike filling defect noted in the proximal right upper lobe pulmonary artery in an area of a prior pulmonary embolus consistent with a fibrotic band. No findings suspicious for acute PE. Mediastinum/Nodes: There is a stable fluid collection noted near the left atrium and inferior pulmonary vein on the right side. This may be some type of epicardial cyst. No mediastinal or hilar adenopathy. The esophagus is grossly. Lungs/Pleura: Chronic appearing patchy  mosaic pattern of ground-glass attenuation could be asymmetric pulmonary edema, small airways disease such as asthma or respiratory bronchiolitis. Other possibilities would include cryptogenic organizing pneumonia or hypersensitivity pneumonitis. There are also small bilateral pleural effusions and overlying atelectasis. No worrisome pulmonary lesions. Upper Abdomen: No  significant upper abdominal findings. Musculoskeletal: No breast masses are identified. There is a complex appearing fluid collection containing gas involving the right chest wall anteriorly. The upper part of this abscess is anterior to the thyroid gland and it appears it may communicate with a second lower abscess near the right sternoclavicular joint and anterior to the right clavicular head. Do not see any definite CT findings for septic arthritis at the right [place] joint that would be a possibility. This measures a maximum of 7.5 cm. Aspiration may be helpful for diagnostic and therapeutic purposes. MRI examination of the chest wall may be helpful for further evaluation. Review of the MIP images confirms the above findings. IMPRESSION: 1. Small bandlike filling defect in the proximal right upper lobe pulmonary artery in an area of a prior pulmonary embolus consistent with a fibrotic band. No findings suspicious for acute PE. 2. Stable tortuosity and mild ectasia of the thoracic aorta but no aneurysm or dissection. 3. Chronic appearing patchy mosaic pattern of ground-glass attenuation could be asymmetric pulmonary edema, small airways disease such as asthma or respiratory bronchiolitis. Other possibilities would include cryptogenic organizing pneumonia or hypersensitivity pneumonitis. 4. Small bilateral pleural effusions and overlying atelectasis. 5. 7.5 cm anterior chest wall abscess of uncertain origin. Right sternoclavicular joint septic arthritis would be a possibility although I do not see any definite CT evidence of this. MRI may be helpful for further evaluation. Electronically Signed   By: Marijo Sanes M.D.   On: 02/28/2021 05:47   DG Chest Port 1 View  Result Date: 02/28/2021 CLINICAL DATA:  Shortness of breath for 1 week.  Chest pain. EXAM: PORTABLE CHEST 1 VIEW COMPARISON:  CT 09/21/2020 FINDINGS: Shallow inspiration. Mild cardiac enlargement. No vascular congestion, edema, or consolidation.  Prominence of the mediastinum is unchanged since prior study and correlates to a combination of prominent mediastinal fat and lymphadenopathy. No airspace disease or consolidation in the lungs. IMPRESSION: Shallow inspiration. Cardiac enlargement. Prominent mediastinal structures correspond to mediastinal fat and lymphadenopathy on previous CT. No active pulmonary disease. Electronically Signed   By: Lucienne Capers M.D.   On: 02/28/2021 00:38    Procedures .Critical Care E&M Performed by: Ripley Fraise, MD  Critical care provider statement:    Critical care time (minutes):  45   Critical care start time:  02/28/2021 5:15 AM   Critical care end time:  02/28/2021 6:00 AM   Critical care time was exclusive of:  Separately billable procedures and treating other patients   Critical care was necessary to treat or prevent imminent or life-threatening deterioration of the following conditions:  Respiratory failure and metabolic crisis   Critical care was time spent personally by me on the following activities:  Development of treatment plan with patient or surrogate, pulse oximetry, ordering and review of radiographic studies, ordering and review of laboratory studies, ordering and performing treatments and interventions, evaluation of patient's response to treatment, examination of patient and obtaining history from patient or surrogate   I assumed direction of critical care for this patient from another provider in my specialty: no     Care discussed with: admitting provider   After initial E/M assessment, critical care services were subsequently performed that were exclusive of  separately billable procedures or treatment.     Medications Ordered in ED Medications  potassium chloride 10 mEq in 100 mL IVPB (10 mEq Intravenous New Bag/Given 02/28/21 0550)  potassium chloride 10 mEq in 100 mL IVPB (0 mEq Intravenous Stopped 02/28/21 0341)  sodium chloride 0.9 % bolus 1,000 mL (0 mLs Intravenous  Stopped 02/28/21 0606)  iohexol (OMNIPAQUE) 350 MG/ML injection 80 mL (80 mLs Intravenous Contrast Given 02/28/21 0449)    ED Course  I have reviewed the triage vital signs and the nursing notes.  Pertinent labs & imaging results that were available during my care of the patient were reviewed by me and considered in my medical decision making (see chart for details).    MDM Rules/Calculators/A&P                           This patient presents to the ED for concern of shortness of breath, this involves an extensive number of treatment options, and is a complaint that carries with it a high risk of complications and morbidity.  The differential diagnosis includes pulmonary embolism, pneumonia, CHF, acute coronary syndrome   Lab Tests:  I Ordered, reviewed, and interpreted labs, which included electrolytes, complete blood count, BNP  Medicines ordered:  I ordered medication potassium for hypokalemia  Imaging Studies ordered:  I ordered imaging studies which included chest x-ray  I independently visualized and interpreted imaging which showed cardiomegaly  Additional history obtained:  Additional history obtained from husband Previous records obtained and reviewed   Consultations Obtained:  I consulted Dr. Alcario Drought with [facility] hospitalist and discussed lab and imaging findings  Reevaluation:  After the interventions stated above, I reevaluated the patient and found patient stable  Critical Interventions:  Patient with multiple medical conditions including neurosarcoidosis, limited mobility presents with shortness of breath.  She has a new oxygen requirement is on about 2 L nasal cannula.  Patient has history of PE, is on anticoagulation but is at high risk for breakthrough PE due to immobility.  CT angio was performed, no acute PE.  She does have evidence of possible edema versus pneumonitis.  Will defer antibiotics for now.  We will also defer diuresis as patient is quite  hypokalemic.  Also questionable area of septic arthritis of right sternoclavicular joint, but has reports that area has been swollen for over a year and has not acutely changed Patient will be admitted to the hospitalist Final Clinical Impression(s) / ED Diagnoses Final diagnoses:  Acute respiratory failure with hypoxia ([place])  Hypokalemia    Rx / DC Orders ED Discharge Orders     None        Ripley Fraise, MD 02/28/21 [phone]

## 2021-02-28 NOTE — ED Notes (Signed)
RN and MD turned pt to assess wounds, pt found to be grossly incontinent with one brief on and 4 feminine hygiene pads inside the brief. RN removed brief, replaced sacrum dressing and placed new pad under pt. Placed purewick. Husband at bedside, call light in reach, no distress noted.

## 2021-02-28 NOTE — Evaluation (Signed)
Clinical/Bedside Swallow Evaluation Patient Details  Name: Rhonda [name] MRN: [id number] Date of Birth: 06/30/1960  Today's Date: 02/28/2021 Time: SLP Start Time (ACUTE ONLY): 1520 SLP Stop Time (ACUTE ONLY): 1540 SLP Time Calculation (min) (ACUTE ONLY): 20 min  Past Medical History:  Past Medical History:  Diagnosis Date   Arthritis    Colostomy in place [facility])    ?ischemic bowel   Degenerative disc disease, lumbar    DNR (do not resuscitate) 02/28/2021   GERD (gastroesophageal reflux disease)    Hypertension    OSA (obstructive sleep apnea)    not currently on CPAP   Prediabetes    Sarcoidosis of central nervous system    Seizures (HCC)    Type 2    Urinary incontinence    Past Surgical History:  Past Surgical History:  Procedure Laterality Date   ABDOMINAL HYSTERECTOMY     BOWEL RESECTION N/A 09/01/2020   Procedure: SMALL BOWEL RESECTION;  Surgeon: Georganna Skeans, MD;  Location: [place];  Service: General;  Laterality: N/A;   BREAST EXCISIONAL BIOPSY Right    BREAST SURGERY     right breast   CESAREAN SECTION     COLONOSCOPY WITH PROPOFOL N/A 07/26/2020   Procedure: COLONOSCOPY WITH PROPOFOL;  Surgeon: Carol Ada, MD;  Location: [place];  Service: Endoscopy;  Laterality: N/A;   ESOPHAGOGASTRODUODENOSCOPY (EGD) WITH PROPOFOL N/A 07/24/2020   Procedure: ESOPHAGOGASTRODUODENOSCOPY (EGD) WITH PROPOFOL;  Surgeon: Carol Ada, MD;  Location: [place];  Service: Endoscopy;  Laterality: N/A;  Bleeding with anemia-5 gms drop in hemoglobin   GIVENS CAPSULE STUDY N/A 08/21/2020   Procedure: GIVENS CAPSULE STUDY;  Surgeon: Juanita Craver, MD;  Location[facility] END[facility]COPY;  Service: Endoscopy;  Laterality: N/A;   IR ANGIOGRAM FOLLOW UP STUDY  08/29/2020   IR ANGIOGRAM SELECTIVE EACH ADDITIONAL VESSEL  08/29/2020   IR ANGIOGRAM VISCERAL SELECTIVE  08/29/2020   IR ANGIOGRAM VISCERAL SELECTIVE  08/29/2020   IR EMBO ART  VEN HEMORR LYMPH EXTRAV  INC GUIDE ROADMAPPING  08/29/2020   IR US  GUIDE VASC ACCESS RIGHT  08/29/2020   LAPAROTOMY N/A 09/01/2020   Procedure: EXPLORATORY LAPAROTOMY;  Surgeon: Georganna Skeans, MD;  Location: [place];  Service: General;  Laterality: N/A;   HPI:  Rhonda [name] is a 60 y.o. female with medical history significant of HTN; OSA not on CPAP; prediabetes; neurosarcoidosis; colostomy; and seizures presenting with SOB.  She has been having trouble breathing and complaining of chest pain.    Appears to be epigastric when she points to it.  +cough, nonproductive.  She does throat clearing after drinking water per MD note. CT angio shows Chronic appearing patchy mosaic pattern of ground-glass  attenuation could be asymmetric pulmonary edema, small airways  disease such as asthma or respiratory bronchiolitis. Other  possibilities would include cryptogenic organizing pneumonia or  hypersensitivity pneumonitis.    Assessment / Plan / Recommendation  Clinical Impression  Pt demonstrates no immediate observations of dysphagia. Her husband reports she sometimes coughs after water, but pt consumed 3 oz of thin liquids consecutively without impairment today x2. She does have a history of neurosarcoidosis, significant dry mouth and also reports reflux as well as suboptimal positioning with meals. Will f/u x1 to observe with a meal given report of occasional coughing wlith liquids. Recommend mechanical soft diet and thin liquids at this time. SLP Visit Diagnosis: Dysphagia, unspecified (R13.10)    Aspiration Risk  Mild aspiration risk    Diet Recommendation Dysphagia 3 (Mech soft);Thin liquid  Liquid Administration via: Cup;Straw Medication Administration: Whole meds with liquid Supervision: Patient able to self feed    Other  Recommendations      Recommendations for follow up therapy are one component of a multi-disciplinary discharge planning process, led by the attending physician.  Recommendations may be updated based on patient status, additional functional  criteria and insurance authorization.  Follow up Recommendations 24 hour supervision/assistance      Frequency and Duration            Prognosis        Swallow Study   General HPI: Rhonda [name] is a 60 y.o. female with medical history significant of HTN; OSA not on CPAP; prediabetes; neurosarcoidosis; colostomy; and seizures presenting with SOB.  She has been having trouble breathing and complaining of chest pain.    Appears to be epigastric when she points to it.  +cough, nonproductive.  She does throat clearing after drinking water per MD note. CT angio shows Chronic appearing patchy mosaic pattern of ground-glass  attenuation could be asymmetric pulmonary edema, small airways  disease such as asthma or respiratory bronchiolitis. Other  possibilities would include cryptogenic organizing pneumonia or  hypersensitivity pneumonitis. Type of Study: Bedside Swallow Evaluation Diet Prior to this Study: NPO Temperature Spikes Noted: No Respiratory Status: Room air History of Recent Intubation: No Behavior/Cognition: Alert;Cooperative;Pleasant mood Oral Cavity Assessment: Dry Oral Care Completed by SLP: No Oral Cavity - Dentition: Edentulous;Dentures, not available;Dentures, top Vision: Functional for self-feeding Self-Feeding Abilities: Total assist Patient Positioning: Upright in bed Baseline Vocal Quality: Normal Volitional Cough: Strong Volitional Swallow: Able to elicit    Oral/Motor/Sensory Function Overall Oral Motor/Sensory Function: Within functional limits   Ice Chips     Thin Liquid Thin Liquid: Within functional limits Presentation: Straw    Nectar Thick Nectar Thick Liquid: Not tested   Honey Thick Honey Thick Liquid: Not tested   Puree Puree: Within functional limits   Solid     Solid: Impaired Oral Phase Functional Implications: Impaired mastication     Herbie Baltimore, MA CCC-SLP  Acute Rehabilitation Services Office [phone]  Lynann Beaver 02/28/2021,4:12 PM

## 2021-02-28 NOTE — Progress Notes (Signed)
   02/28/21 1949  Assess: MEWS Score  ECG Heart Rate (!) 115  Resp 17  Assess: MEWS Score  MEWS Temp 0  MEWS Systolic 0  MEWS Pulse 2  MEWS RR 0  MEWS LOC 0  MEWS Score 2  MEWS Score Color Yellow  Assess: if the MEWS score is Yellow or Red  Were vital signs taken at a resting state? Yes  Focused Assessment No change from prior assessment  Early Detection of Sepsis Score *See Row Information* Low  MEWS guidelines implemented *See Row Information* No, previously yellow, continue vital signs every 4 hours  No acute distress noted, pt resting.

## 2021-02-28 NOTE — Consult Note (Signed)
Consult Note  Elianie Hubers [facility] 1961-03-31  [id number].    Requesting MD: Karmen Bongo, MD Chief Complaint/Reason for Consult: Sacral wound   HPI:  Patient is a 60 year old female who presented to [facility] with SOB that started yesterday AM. She reports a cough that is non-productive. Patient is being admitted for further workup of this. She is also noted to have a sacral decubitus wound. General surgery consulted for evaluation of this. Patient lives at home with her husband who reports wound started out as a small area about the size of a quarter and has progressively gotten larger. It started when she was at a facility and they did not appear to be doing much for wound care. Since being home, they have been packing the wound with saline wet to dry dressings twice daily and trying to turn her frequently. PMH otherwise significant for Hx of perforated sigmoid colon - S/P Hartmann's in 08/2020, Colostomy in place, HTN, HLD, Hx of PE, Uncontrolled T2DM, Neurosarcoidosis, Seizures, OSA not on CPAP, Obesity. She is on eliquis at home (last dose was 10/12).   ROS: Review of Systems  Constitutional:  Negative for chills and fever.  Respiratory:  Positive for cough and shortness of breath.   Cardiovascular:  Positive for chest pain. Negative for leg swelling.  Gastrointestinal:  Negative for abdominal pain, constipation, nausea and vomiting.  Musculoskeletal:        Sacral wound with foul odor  All other systems reviewed and are negative.  Family History  Problem Relation Age of Onset   Cancer Mother    Hypertension Sister    Diabetes Sister    Diabetes Maternal Grandmother     Past Medical History:  Diagnosis Date   Arthritis    Colostomy in place [facility])    ?ischemic bowel   Degenerative disc disease, lumbar    DNR (do not resuscitate) 02/28/2021   GERD (gastroesophageal reflux disease)    Hypertension    OSA (obstructive sleep apnea)    not currently on CPAP   Prediabetes     Sarcoidosis of central nervous system    Seizures (HCC)    Type 2    Urinary incontinence     Past Surgical History:  Procedure Laterality Date   ABDOMINAL HYSTERECTOMY     BOWEL RESECTION N/A 09/01/2020   Procedure: SMALL BOWEL RESECTION;  Surgeon: Georganna Skeans, MD;  Location: [place];  Service: General;  Laterality: N/A;   BREAST EXCISIONAL BIOPSY Right    BREAST SURGERY     right breast   CESAREAN SECTION     COLONOSCOPY WITH PROPOFOL N/A 07/26/2020   Procedure: COLONOSCOPY WITH PROPOFOL;  Surgeon: Carol Ada, MD;  Location: [place];  Service: Endoscopy;  Laterality: N/A;   ESOPHAGOGASTRODUODENOSCOPY (EGD) WITH PROPOFOL N/A 07/24/2020   Procedure: ESOPHAGOGASTRODUODENOSCOPY (EGD) WITH PROPOFOL;  Surgeon: Carol Ada, MD;  Location: [place];  Service: Endoscopy;  Laterality: N/A;  Bleeding with anemia-5 gms drop in hemoglobin   GIVENS CAPSULE STUDY N/A 08/21/2020   Procedure: GIVENS CAPSULE STUDY;  Surgeon: Juanita Craver, MD;  Location: [facility] ENDOSCOPY;  Service: Endoscopy;  Laterality: N/A;   IR ANGIOGRAM FOLLOW UP STUDY  08/29/2020   IR ANGIOGRAM SELECTIVE EACH ADDITIONAL VESSEL  08/29/2020   IR ANGIOGRAM VISCERAL SELECTIVE  08/29/2020   IR ANGIOGRAM VISCERAL SELECTIVE  08/29/2020   IR EMBO ART  VEN HEMORR LYMPH EXTRAV  INC GUIDE ROADMAPPING  08/29/2020   IR US GUIDE VASC ACCESS RIGHT  08/29/2020   LAPAROTOMY N/A 09/01/2020   Procedure: EXPLORATORY LAPAROTOMY;  Surgeon: Georganna Skeans, MD;  Location: [place];  Service: General;  Laterality: N/A;    Social History:  reports that she has never smoked. She has never used smokeless tobacco. She reports that she does not drink alcohol and does not use drugs.  Allergies:  Allergies  Allergen Reactions   Aspirin Swelling   Augmentin [Amoxicillin-Pot Clavulanate] Swelling and Other (See Comments)    **patient has tolerated piperacillin/tazobactam, cephalexin, and ceftriaxone Hand swelled at IV site - likely extravasation     Medications Prior to Admission  Medication Sig Dispense Refill   acetaminophen (TYLENOL) 500 MG tablet Take 1,000 mg by mouth every 6 (six) hours as needed for moderate pain or headache.     apixaban (ELIQUIS) 5 MG TABS tablet Take 1 tablet (5 mg total) by mouth 2 (two) times daily. 60 tablet    atorvastatin (LIPITOR) 80 MG tablet Take 1 tablet (80 mg total) by mouth daily at 6 PM. 30 tablet 0   b complex vitamins tablet Take 1 tablet by mouth daily.     blood glucose meter kit and supplies KIT Dispense based on patient and insurance preference. Use up to four times daily as directed. 1 each 0   Calcium Carb-Cholecalciferol (CALCIUM+D3 PO) Take 1 tablet by mouth daily with lunch.     Ensure Max Protein (ENSURE MAX PROTEIN) LIQD Take 330 mLs (11 oz total) by mouth at bedtime. (Patient taking differently: Take 11 oz by mouth daily as needed (if not eating).)     FEROSUL 325 (65 Fe) MG tablet Take 325 mg by mouth daily with breakfast.     gabapentin (NEURONTIN) 100 MG capsule Take 100 mg by mouth 3 (three) times daily.     glucosamine-chondroitin 500-400 MG tablet Take 1 tablet by mouth 3 (three) times daily. (Patient taking differently: Take 1 tablet by mouth in the morning and at bedtime.) 90 tablet 2   insulin aspart (NOVOLOG) 100 UNIT/ML FlexPen Inject 1-20 Units into the skin 3 (three) times daily with meals. CBG 70 - 120: 0 units  CBG 121 - 150: 3 units  CBG 151 - 200: 4 units  CBG 201 - 250: 7 units  CBG 251 - 300: 11 units  CBG 301 - 350: 15 units  CBG 351 - 400: 20 units 15 mL 11   insulin glargine (LANTUS) 100 UNIT/ML injection Inject 0.14 mLs (14 Units total) into the skin at bedtime. (Patient taking differently: Inject 25-28 Units into the skin 2 (two) times daily. 28 units in the morning 25 units at bedtime) 10 mL 11   Insulin Pen Needle 29G X 5MM MISC USE WITH LANTUS 100 each 0   Melatonin 10 MG TABS Take 10 mg by mouth at bedtime.     metoprolol tartrate (LOPRESSOR) 25 MG  tablet Take 25 mg by mouth 2 (two) times daily.     Multiple Vitamin (MULTIVITAMIN WITH MINERALS) TABS tablet Take 1 tablet by mouth daily with lunch.      Omega-3 Fatty Acids (FISH OIL) 1000 MG CAPS Take 1,000 mg by mouth daily with lunch.      ondansetron (ZOFRAN ODT) 4 MG disintegrating tablet Take 1 tablet (4 mg total) by mouth every 8 (eight) hours as needed for nausea or vomiting. 20 tablet 0   pantoprazole (PROTONIX) 40 MG tablet Take 1 tablet (40 mg total) by mouth daily.     polyethylene glycol (MIRALAX / GLYCOLAX)  17 g packet Take 17 g by mouth 2 (two) times daily. (Patient taking differently: Take 17 g by mouth daily as needed for mild constipation.) 14 each 0   predniSONE (DELTASONE) 20 MG tablet Take 40 mg by mouth 2 (two) times daily with a meal.     REFRESH 1.4-0.6 % SOLN Place 1 drop into both eyes daily as needed (dry eyes).     silver sulfADIAZINE (SILVADENE) 1 % cream Apply 1 application topically daily as needed (wound care).      Blood pressure (!) 127/95, pulse (!) 102, temperature 98.8 F (37.1 C), temperature source Oral, resp. rate (!) 28, height $RemoveBe'5\' 3"'yfizNaOhU$  (1.6 m), weight 80.7 kg, SpO2 100 %. Physical Exam:  General: pleasant, WD, chronically ill appearing female who is laying in bed in NAD HEENT: head is normocephalic, atraumatic.  Sclera are noninjected. Ears and nose without any masses or lesions.  Mouth is pink and moist Heart: regular, rate, and rhythm.  Normal s1,s2. No obvious murmurs, gallops, or rubs noted.  Palpable radial and pedal pulses bilaterally Lungs: CTAB, no wheezes, rhonchi, or rales noted.  Respiratory effort nonlabored Abd: soft, NT, ND, colostomy in place and functioning, abdominal dressings c/d/I GU: sacral wound as noted below with significant necrotic tissue present, superior eschar was separated some through the center to allow better exposure of wound, undermining of about 4-5 cm around wound, malodorous   MS: all 4 extremities are symmetrical  with no cyanosis, clubbing, or edema. Skin: warm and dry with no masses, lesions, or rashes Psych: A&Ox3 with an appropriate affect.   Results for orders placed or performed during the hospital encounter of 02/27/21 (from the past 48 hour(s))  Basic metabolic panel     Status: Abnormal   Collection Time: 02/28/21 12:18 AM  Result Value Ref Range   Sodium 127 (L) 135 - 145 mmol/L   Potassium 2.5 (LL) 3.5 - 5.1 mmol/L    Comment: CRITICAL RESULT CALLED TO, READ BACK BY AND VERIFIED WITH: CHRIS CHRISCO RN 02/28/21 0125 M KOROLESKI    Chloride 88 (L) 98 - 111 mmol/L   CO2 29 22 - 32 mmol/L   Glucose, Bld 372 (H) 70 - 99 mg/dL    Comment: Glucose reference range applies only to samples taken after fasting for at least 8 hours.   BUN 6 6 - 20 mg/dL   Creatinine, Ser 0.50 0.44 - 1.00 mg/dL   Calcium 8.1 (L) 8.9 - 10.3 mg/dL   GFR, Estimated >60 >60 mL/min    Comment: (NOTE) Calculated using the CKD-EPI Creatinine Equation (2021)    Anion gap 10 5 - 15    Comment: Performed at [facility] [address], [place], [place] [address]  CBC with Differential/Platelet     Status: Abnormal   Collection Time: 02/28/21 12:18 AM  Result Value Ref Range   WBC 13.1 (H) 4.0 - 10.5 K/uL   RBC 3.78 (L) 3.87 - 5.11 MIL/uL   Hemoglobin 10.8 (L) 12.0 - 15.0 g/dL   HCT 35.1 (L) 36.0 - 46.0 %   MCV 92.9 80.0 - 100.0 fL   MCH 28.6 26.0 - 34.0 pg   MCHC 30.8 30.0 - 36.0 g/dL   RDW 16.3 (H) 11.5 - 15.5 %   Platelets 56 (L) 150 - 400 K/uL    Comment: Immature Platelet Fraction may be clinically indicated, consider ordering this additional test [id number] REPEATED TO VERIFY PLATELET COUNT CONFIRMED BY SMEAR    nRBC 0.3 (H)  0.0 - 0.2 %   Neutrophils Relative % 95 %   Neutro Abs 12.3 (H) 1.7 - 7.7 K/uL   Lymphocytes Relative 2 %   Lymphs Abs 0.3 (L) 0.7 - 4.0 K/uL   Monocytes Relative 1 %   Monocytes Absolute 0.1 0.1 - 1.0 K/uL   Eosinophils Relative 0 %   Eosinophils Absolute 0.0 0.0 - 0.5  K/uL   Basophils Relative 0 %   Basophils Absolute 0.1 0.0 - 0.1 K/uL   WBC Morphology INCREASED BANDS (>20% BANDS)     Comment: MILD LEFT SHIFT (1-5% METAS, OCC MYELO, OCC BANDS)   Immature Granulocytes 2 %   Abs Immature Granulocytes 0.28 (H) 0.00 - 0.07 K/uL    Comment: Performed at [facility] [address], [place], [place] [address]  Brain natriuretic peptide     Status: Abnormal   Collection Time: 02/28/21 12:18 AM  Result Value Ref Range   B Natriuretic Peptide 440.8 (H) 0.0 - 100.0 pg/mL    Comment: Performed at [facility] [address], [place], [place] [address]  Magnesium     Status: None   Collection Time: 02/28/21 12:18 AM  Result Value Ref Range   Magnesium 1.7 1.7 - 2.4 mg/dL    Comment: Performed at [facility] [address], [place], [place] [address]  Resp Panel by RT-PCR (Flu A&B, Covid) Nasopharyngeal Swab     Status: None   Collection Time: 02/28/21  1:55 AM   Specimen: Nasopharyngeal Swab; Nasopharyngeal(NP) swabs in vial transport medium  Result Value Ref Range   SARS Coronavirus 2 by RT PCR NEGATIVE NEGATIVE    Comment: (NOTE) SARS-CoV-2 target nucleic acids are NOT DETECTED.  The SARS-CoV-2 RNA is generally detectable in upper respiratory specimens during the acute phase of infection. The lowest concentration of SARS-CoV-2 viral copies this assay can detect is 138 copies/mL. A negative result does not preclude SARS-Cov-2 infection and should not be used as the sole basis for treatment or other patient management decisions. A negative result may occur with  improper specimen collection/handling, submission of specimen other than nasopharyngeal swab, presence of viral mutation(s) within the areas targeted by this assay, and inadequate number of viral copies(<138 copies/mL). A negative result must be combined with clinical observations, patient history, and epidemiological information. The expected result is Negative.  Fact  Sheet for Patients:  EntrepreneurPulse.com.au  Fact Sheet for Healthcare Providers:  IncredibleEmployment.be  This test is no t yet approved or cleared by the [place] FDA and  has been authorized for detection and/or diagnosis of SARS-CoV-2 by FDA under an Emergency Use Authorization (EUA). This EUA will remain  in effect (meaning this test can be used) for the duration of the COVID-19 declaration under Section 564(b)(1) of the Act, 21 U.S.C.section 360bbb-3(b)(1), unless the authorization is terminated  or revoked sooner.       Influenza A by PCR NEGATIVE NEGATIVE   Influenza B by PCR NEGATIVE NEGATIVE    Comment: (NOTE) The Xpert Xpress SARS-CoV-2/FLU/RSV plus assay is intended as an aid in the diagnosis of influenza from Nasopharyngeal swab specimens and should not be used as a sole basis for treatment. Nasal washings and aspirates are unacceptable for Xpert Xpress SARS-CoV-2/FLU/RSV testing.  Fact Sheet for Patients: EntrepreneurPulse.com.au  Fact Sheet for Healthcare Providers: IncredibleEmployment.be  This test is not yet approved or cleared by the [place] FDA and has been authorized for detection and/or diagnosis of SARS-CoV-2 by FDA under  an Emergency Use Authorization (EUA). This EUA will remain in effect (meaning this test can be used) for the duration of the COVID-19 declaration under Section 564(b)(1) of the Act, 21 U.S.C. section 360bbb-3(b)(1), unless the authorization is terminated or revoked.  Performed at [facility], [place] [address] [place], [place] [address]    CT Angio Chest PE W and/or Wo Contrast  Result Date: 02/28/2021 CLINICAL DATA:  Shortness of breath. EXAM: CT ANGIOGRAPHY CHEST WITH CONTRAST TECHNIQUE: Multidetector CT imaging of the chest was performed using the standard protocol during bolus administration of intravenous contrast. Multiplanar CT image  reconstructions and MIPs were obtained to evaluate the vascular anatomy. CONTRAST:  42mL OMNIPAQUE IOHEXOL 350 MG/ML SOLN COMPARISON:  CT scan 09/21/2020 FINDINGS: Cardiovascular: The heart is mildly enlarged but stable. No pericardial effusion. Stable tortuosity and mild ectasia of the thoracic aorta but no aneurysm or dissection. Minimal atherosclerotic calcification at the aortic arch. The branch vessels are patent. No definite coronary artery calcifications. The pulmonary arterial tree is well opacified. Small bandlike filling defect noted in the proximal right upper lobe pulmonary artery in an area of a prior pulmonary embolus consistent with a fibrotic band. No findings suspicious for acute PE. Mediastinum/Nodes: There is a stable fluid collection noted near the left atrium and inferior pulmonary vein on the right side. This may be some type of epicardial cyst. No mediastinal or hilar adenopathy. The esophagus is grossly. Lungs/Pleura: Chronic appearing patchy mosaic pattern of ground-glass attenuation could be asymmetric pulmonary edema, small airways disease such as asthma or respiratory bronchiolitis. Other possibilities would include cryptogenic organizing pneumonia or hypersensitivity pneumonitis. There are also small bilateral pleural effusions and overlying atelectasis. No worrisome pulmonary lesions. Upper Abdomen: No significant upper abdominal findings. Musculoskeletal: No breast masses are identified. There is a complex appearing fluid collection containing gas involving the right chest wall anteriorly. The upper part of this abscess is anterior to the thyroid gland and it appears it may communicate with a second lower abscess near the right sternoclavicular joint and anterior to the right clavicular head. Do not see any definite CT findings for septic arthritis at the right [place] joint that would be a possibility. This measures a maximum of 7.5 cm. Aspiration may be helpful for diagnostic and  therapeutic purposes. MRI examination of the chest wall may be helpful for further evaluation. Review of the MIP images confirms the above findings. IMPRESSION: 1. Small bandlike filling defect in the proximal right upper lobe pulmonary artery in an area of a prior pulmonary embolus consistent with a fibrotic band. No findings suspicious for acute PE. 2. Stable tortuosity and mild ectasia of the thoracic aorta but no aneurysm or dissection. 3. Chronic appearing patchy mosaic pattern of ground-glass attenuation could be asymmetric pulmonary edema, small airways disease such as asthma or respiratory bronchiolitis. Other possibilities would include cryptogenic organizing pneumonia or hypersensitivity pneumonitis. 4. Small bilateral pleural effusions and overlying atelectasis. 5. 7.5 cm anterior chest wall abscess of uncertain origin. Right sternoclavicular joint septic arthritis would be a possibility although I do not see any definite CT evidence of this. MRI may be helpful for further evaluation. Electronically Signed   By: Marijo Sanes M.D.   On: 02/28/2021 05:47   DG Chest Port 1 View  Result Date: 02/28/2021 CLINICAL DATA:  Shortness of breath for 1 week.  Chest pain. EXAM: PORTABLE CHEST 1 VIEW COMPARISON:  CT 09/21/2020 FINDINGS: Shallow inspiration. Mild cardiac enlargement. No vascular congestion, edema, or consolidation. Prominence  of the mediastinum is unchanged since prior study and correlates to a combination of prominent mediastinal fat and lymphadenopathy. No airspace disease or consolidation in the lungs. IMPRESSION: Shallow inspiration. Cardiac enlargement. Prominent mediastinal structures correspond to mediastinal fat and lymphadenopathy on previous CT. No active pulmonary disease. Electronically Signed   By: Lucienne Capers M.D.   On: 02/28/2021 00:38      Assessment/Plan Sacral pressure injury, stage IV - cut through superior eschar some today but did not want to be too aggressive with  patient having been on eliquis - recommend PT hydrotherapy to get wound to clean up a little more and allow healthy granulation tissue to try to fill in - will see again with hydrotx tomorrow  - recommend air overlay mattress and frequent turning  - BID wet to dry dressing, making sure to pack into areas where undermining is present as well  - I do not feel patient requires abx at this time  - no acute surgical debridement indicated at this time   FEN: ok to have a diet from a surgical standpoint  VTE: SCDs, eliquis on hold  ID: no current abx  Hx of perforated sigmoid colon - S/P Hartmann's in 08/2020 Colostomy in place HTN HLD Hx of PE SOB/chest pain - work up per primary service  Hypokalemia - 2.5 this AM, replacement per primary service  Uncontrolled T2DM Neurosarcoidosis Seizures OSA not on CPAP Oral thrush Obesity - BMI 31.53 DNR  Norm Parcel, [facility] Surgery 02/28/2021, 11:09 AM Please see Amion for pager number during day hours 7:00am-4:30pm

## 2021-03-01 DIAGNOSIS — Z515 Encounter for palliative care: Secondary | ICD-10-CM | POA: Diagnosis not present

## 2021-03-01 DIAGNOSIS — G4733 Obstructive sleep apnea (adult) (pediatric): Secondary | ICD-10-CM | POA: Diagnosis not present

## 2021-03-01 DIAGNOSIS — Z7189 Other specified counseling: Secondary | ICD-10-CM | POA: Diagnosis not present

## 2021-03-01 DIAGNOSIS — I10 Essential (primary) hypertension: Secondary | ICD-10-CM

## 2021-03-01 DIAGNOSIS — A419 Sepsis, unspecified organism: Secondary | ICD-10-CM

## 2021-03-01 DIAGNOSIS — R6520 Severe sepsis without septic shock: Secondary | ICD-10-CM

## 2021-03-01 DIAGNOSIS — R0602 Shortness of breath: Secondary | ICD-10-CM | POA: Diagnosis not present

## 2021-03-01 DIAGNOSIS — Z794 Long term (current) use of insulin: Secondary | ICD-10-CM

## 2021-03-01 DIAGNOSIS — D8689 Sarcoidosis of other sites: Secondary | ICD-10-CM | POA: Diagnosis not present

## 2021-03-01 DIAGNOSIS — E119 Type 2 diabetes mellitus without complications: Secondary | ICD-10-CM

## 2021-03-01 DIAGNOSIS — E440 Moderate protein-calorie malnutrition: Secondary | ICD-10-CM | POA: Insufficient documentation

## 2021-03-01 DIAGNOSIS — Z66 Do not resuscitate: Secondary | ICD-10-CM | POA: Diagnosis not present

## 2021-03-01 LAB — CBC
HCT: 39.6 % (ref 36.0–46.0)
Hemoglobin: 12.2 g/dL (ref 12.0–15.0)
MCH: 27.8 pg (ref 26.0–34.0)
MCHC: 30.8 g/dL (ref 30.0–36.0)
MCV: 90.2 fL (ref 80.0–100.0)
Platelets: 40 10*3/uL — ABNORMAL LOW (ref 150–400)
RBC: 4.39 MIL/uL (ref 3.87–5.11)
RDW: 16.1 % — ABNORMAL HIGH (ref 11.5–15.5)
WBC: 13.5 10*3/uL — ABNORMAL HIGH (ref 4.0–10.5)
nRBC: 0.9 % — ABNORMAL HIGH (ref 0.0–0.2)

## 2021-03-01 LAB — URINALYSIS, ROUTINE W REFLEX MICROSCOPIC
Bilirubin Urine: NEGATIVE
Glucose, UA: NEGATIVE mg/dL
Ketones, ur: NEGATIVE mg/dL
Nitrite: NEGATIVE
Protein, ur: 100 mg/dL — AB
Specific Gravity, Urine: 1.01 (ref 1.005–1.030)
pH: 7 (ref 5.0–8.0)

## 2021-03-01 LAB — GLUCOSE, CAPILLARY
Glucose-Capillary: 122 mg/dL — ABNORMAL HIGH (ref 70–99)
Glucose-Capillary: 69 mg/dL — ABNORMAL LOW (ref 70–99)
Glucose-Capillary: 79 mg/dL (ref 70–99)
Glucose-Capillary: 35 mg/dL — CL (ref 70–99)
Glucose-Capillary: 65 mg/dL — ABNORMAL LOW (ref 70–99)
Glucose-Capillary: 98 mg/dL (ref 70–99)
Glucose-Capillary: 78 mg/dL (ref 70–99)
Glucose-Capillary: 92 mg/dL (ref 70–99)

## 2021-03-01 LAB — BASIC METABOLIC PANEL
Anion gap: 11 (ref 5–15)
BUN: 11 mg/dL (ref 6–20)
CO2: 30 mmol/L (ref 22–32)
Calcium: 9.1 mg/dL (ref 8.9–10.3)
Chloride: 90 mmol/L — ABNORMAL LOW (ref 98–111)
Creatinine, Ser: 0.52 mg/dL (ref 0.44–1.00)
GFR, Estimated: >60 mL/min (ref >60)
Glucose, Bld: 120 mg/dL — ABNORMAL HIGH (ref 70–99)
Potassium: 4.2 mmol/L (ref 3.5–5.1)
Sodium: 131 mmol/L — ABNORMAL LOW (ref 135–145)

## 2021-03-01 LAB — LACTIC ACID, PLASMA
Lactic Acid, Venous: 1.6 mmol/L (ref 0.5–1.9)
Lactic Acid, Venous: 6 mmol/L (ref 0.5–1.9)

## 2021-03-01 LAB — FERRITIN: Ferritin: 3143 ng/mL — ABNORMAL HIGH (ref 11–307)

## 2021-03-01 LAB — VITAMIN B12: Vitamin B-12: >7500 pg/mL — ABNORMAL HIGH (ref 180–914)

## 2021-03-01 LAB — VITAMIN D 25 HYDROXY (VIT D DEFICIENCY, FRACTURES): Vit D, 25-Hydroxy: 53.05 ng/mL (ref 30–100)

## 2021-03-01 LAB — FOLATE: Folate: 16.3 ng/mL (ref >5.9)

## 2021-03-01 MED ORDER — DEXTROSE 50 % IV SOLN
INTRAVENOUS | Status: AC
  Administered 2021-03-01: 50 mL
  Filled 2021-03-01: qty 50

## 2021-03-01 MED ORDER — DEXTROSE-NACL 5-0.45 % IV SOLN: INTRAVENOUS | Status: DC

## 2021-03-01 MED ORDER — VANCOMYCIN HCL 1750 MG/350ML IV SOLN
1750.0000 mg | Freq: Once | INTRAVENOUS | Status: AC
  Administered 2021-03-01: 1750 mg via INTRAVENOUS
  Filled 2021-03-01: qty 350

## 2021-03-01 MED ORDER — DEXTROSE 50 % IV SOLN
50.0000 mL | Freq: Once | INTRAVENOUS | Status: AC
  Administered 2021-03-01: 50 mL via INTRAVENOUS

## 2021-03-01 MED ORDER — VANCOMYCIN HCL IN DEXTROSE 1-5 GM/200ML-% IV SOLN
1000.0000 mg | INTRAVENOUS | Status: DC
  Administered 2021-03-02 – 2021-03-07 (×6): 1000 mg via INTRAVENOUS
  Filled 2021-03-01 (×6): qty 200

## 2021-03-01 MED ORDER — PIPERACILLIN-TAZOBACTAM 3.375 G IVPB
3.3750 g | Freq: Three times a day (TID) | INTRAVENOUS | Status: DC
  Administered 2021-03-01 (×2): 3.375 g via INTRAVENOUS
  Filled 2021-03-01 (×2): qty 50

## 2021-03-01 MED ORDER — SILVER NITRATE-POT NITRATE 75-25 % EX MISC
1.0000 "application " | Freq: Once | CUTANEOUS | Status: DC
  Filled 2021-03-01 (×2): qty 1

## 2021-03-01 NOTE — Progress Notes (Signed)
Physical Therapy Wound Treatment Patient Details  Name: VERCIE POKORNY MRN: [id number] Date of Birth: [birth year]  Today's Date: 03/01/2021 Time: 5929-2446 Time Calculation (min): 59 min  Subjective  Subjective Assessment Subjective: Pt with some mumbling but difficult to understand Patient and Family Stated Goals: Not stated Date of Onset:  (prior to hospitalization) Prior Treatments: Unknown  Pain Score:  Pt premedicated. Pt with some grimacing during treatment. Unsure if due to wound or her positioning.  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 pink wound, necrotic tissue present, drainage around sacral wound with moderate serosanguineous drainage. (Active)  Wound Image   03/01/21 0800  Dressing Type Foam - Lift dressing to assess site every shift;Barrier Film (skin prep);Santyl;Moist to moist;Gauze (Comment) 03/01/21 0800  Dressing Changed;Clean;Dry;Intact 03/01/21 0800  Dressing Change Frequency Twice a day 03/01/21 0800  State of Healing Eschar 03/01/21 0800  Site / Wound Assessment Bleeding;Black;Brown;Yellow;Red;Pink 03/01/21 0800  % Wound base Red or Granulating 55% 03/01/21 0800  % Wound base Yellow/Fibrinous Exudate 10% 03/01/21 0800  % Wound base Black/Eschar 35% 03/01/21 0800  % Wound base Other/Granulation Tissue (Comment) 0% 03/01/21 0800  Peri-wound Assessment Intact 03/01/21 0800  Wound Length (cm) 10 cm 03/01/21 0800  Wound Width (cm) 6 cm 03/01/21 0800  Wound Depth (cm) 4 cm 03/01/21 0800  Wound Surface Area (cm^2) 60 cm^2 03/01/21 0800  Wound Volume (cm^3) 240 cm^3 03/01/21 0800  Tunneling (cm) 7 02/28/21 1050  Undermining (cm) 2-5 cm undermining 8 o'clock to 3 o'clock 03/01/21 0800  Margins Unattached edges (unapproximated) 03/01/21 0800  Drainage Amount Moderate 03/01/21 0800  Drainage Description Sanguineous;Odor 03/01/21 0800  Treatment Debridement  (Selective);Hydrotherapy (Pulse lavage);Packing (Saline gauze) 03/01/21 0800   Hydrotherapy Pulsed lavage therapy - wound location: sacrum Pulsed Lavage with Suction (psi): 12 psi (8-12) Pulsed Lavage with Suction - Normal Saline Used: 1000 mL Pulsed Lavage Tip: Tip with splash shield Selective Debridement Selective Debridement - Location: sacrum Selective Debridement - Tools Used: Forceps, Scissors Selective Debridement - Tissue Removed: black necrotic tissue    Wound Assessment and Plan  Wound Therapy - Assess/Plan/Recommendations Wound Therapy - Clinical Statement: Pt presents to hydrotherapy with large sacral wound that can benefit from hydrotherapy for removal of nonviable tissue and to promote wound healing. Wound Therapy - Functional Problem List: Decreased tolerance for sitting Factors Delaying/Impairing Wound Healing: Incontinence, Immobility, Multiple medical problems, Polypharmacy Hydrotherapy Plan: Debridement, Dressing change, Patient/family education, Pulsatile lavage with suction Wound Therapy - Frequency: 6X / week Wound Therapy - Follow Up Recommendations: dressing changes by RN, dressing changes by family/patient  Wound Therapy Goals- Improve the function of patient's integumentary system by progressing the wound(s) through the phases of wound healing (inflammation - proliferation - remodeling) by: Wound Therapy Goals - Improve the function of patient's integumentary system by progressing the wound(s) through the phases of wound healing by: Decrease Necrotic Tissue to: 20 Decrease Necrotic Tissue - Progress: Goal set today Increase Granulation Tissue to: 80 Increase Granulation Tissue - Progress: Goal set today Goals/treatment plan/discharge plan were made with and agreed upon by patient/family: No, Patient unable to participate in goals/treatment/discharge plan and family unavailable Time For Goal Achievement: 7 days Wound Therapy - Potential for Goals: Good  Goals  will be updated until maximal potential achieved or discharge criteria met.  Discharge criteria: when goals achieved, discharge from hospital, MD decision/surgical intervention, no progress towards goals, refusal/missing three consecutive treatments without notification or  medical reason.  GP     Charges PT Wound Care Charges $Wound Debridement up to 20 cm: < or equal to 20 cm $ Wound Debridement each add'l 20 sqcm: 1 $PT Hydrotherapy Dressing: 1 dressing $PT PLS Gun and Tip: 1 Supply $PT Hydrotherapy Visit: 1 Visit       Shary Decamp [facility] 03/01/2021, 9:17 AM [place] Pager [phone] Office [phone]

## 2021-03-01 NOTE — Consult Note (Signed)
WOC added wound care orders for surgical wound to the midline abdomen.  CCS did not address surgical wound in their notes.   [place], [place], [place]

## 2021-03-01 NOTE — Progress Notes (Signed)
   03/01/21 1816  Assess: MEWS Score  Temp (!) 96.3 F (35.7 C)  BP (!) 120/94  ECG Heart Rate (!) 116  Resp (!) 23  SpO2 96 %  O2 Device Nasal Cannula  O2 Flow Rate (L/min) 2 L/min  Assess: MEWS Score  MEWS Temp 1  MEWS Systolic 0  MEWS Pulse 2  MEWS RR 1  MEWS LOC 1  MEWS Score 5  MEWS Score Color Red  Assess: if the MEWS score is Yellow or Red  Were vital signs taken at a resting state? Yes  Focused Assessment No change from prior assessment  Early Detection of Sepsis Score *See Row Information* Medium  MEWS guidelines implemented *See Row Information* Yes  Treat  MEWS Interventions Escalated (See documentation below)  Take Vital Signs  Increase Vital Sign Frequency  Red: Q 1hr X 4 then Q 4hr X 4, if remains red, continue Q 4hrs  Escalate  MEWS: Escalate Red: discuss with charge nurse/RN and provider, consider discussing with RRT  Notify: Charge Nurse/RN  Name of Charge Nurse/RN Notified Sam RN  Date Charge Nurse/RN Notified 03/01/21  Time Charge Nurse/RN Notified 1825  Document  Patient Outcome Other (Comment) (MD aware, will monitor)  Progress note created (see row info) Yes

## 2021-03-01 NOTE — Progress Notes (Signed)
Pt had sip of water/ orange juice. Noted coughing after intake. Pt c/o nausea . Zofran administered. Will continue to monitor. Pt sitting up semi-fowlers this time. Family at bedside and educated on risk of aspiration.

## 2021-03-01 NOTE — Progress Notes (Addendum)
CBG 35, dextrose IV administered . Pt still lethargic at this time, but arousable with stimuli.  Will recheck CBG. BS level 96 after re check.

## 2021-03-01 NOTE — Progress Notes (Signed)
Progress Note     Subjective: Seen with hydrotherapy. Minor bleeding that stopped with pressure.   Objective: Vital signs in last 24 hours: Temp:  [98.1 F (36.7 C)-98.5 F (36.9 C)] 98.2 F (36.8 C) (10/13 2054) Pulse Rate:  [76] 76 (10/14 0619) Resp:  [17-33] 20 (10/14 0619) BP: (118-134)/(81-97) 118/84 (10/14 0619) SpO2:  [91 %-100 %] 91 % (10/14 0619) Last BM Date: 02/28/21  Intake/Output from previous day: 10/13 0701 - 10/14 0700 In: 480 [P.O.:480] Out: 100 [Stool:100] Intake/Output this shift: No intake/output data recorded.  PE: GU: Sacral wound as pictured below with overlying necrotic bridge of tissue now absent, necrotic tissue in superior aspect of wound, no purulence     Lab Results:  Recent Labs    02/28/21 0018 03/01/21 0504  WBC 13.1* 13.5*  HGB 10.8* 12.2  HCT 35.1* 39.6  PLT 56* 40*   BMET Recent Labs    02/28/21 0018 03/01/21 0504  NA 127* 131*  K 2.5* 4.2  CL 88* 90*  CO2 29 30  GLUCOSE 372* 120*  BUN 6 11  CREATININE 0.50 0.52  CALCIUM 8.1* 9.1   PT/INR No results for input(s): LABPROT, INR in the last 72 hours. CMP     Component Value Date/Time   NA 131 (L) 03/01/2021 0504   NA 129 (L) 08/06/2020 1003   K 4.2 03/01/2021 0504   CL 90 (L) 03/01/2021 0504   CO2 30 03/01/2021 0504   GLUCOSE 120 (H) 03/01/2021 0504   BUN 11 03/01/2021 0504   BUN 6 08/06/2020 1003   CREATININE 0.52 03/01/2021 0504   CREATININE 0.85 08/02/2015 1710   CALCIUM 9.1 03/01/2021 0504   PROT 4.7 (L) 02/16/2021 2031   PROT 4.9 (L) 08/06/2020 1003   ALBUMIN 1.7 (L) 02/16/2021 2031   ALBUMIN 3.2 (L) 08/06/2020 1003   AST 57 (H) 02/16/2021 2031   ALT 60 (H) 02/16/2021 2031   ALKPHOS 337 (H) 02/16/2021 2031   BILITOT 0.6 02/16/2021 2031   BILITOT 0.4 08/06/2020 1003   GFRNONAA >60 03/01/2021 0504   GFRNONAA 78 08/02/2015 1710   GFRAA >60 01/17/2020 0546   GFRAA >89 08/02/2015 1710   Lipase     Component Value Date/Time   LIPASE 36  02/16/2021 2031       Studies/Results: CT Angio Chest PE W and/or Wo Contrast  Result Date: 02/28/2021 CLINICAL DATA:  Shortness of breath. EXAM: CT ANGIOGRAPHY CHEST WITH CONTRAST TECHNIQUE: Multidetector CT imaging of the chest was performed using the standard protocol during bolus administration of intravenous contrast. Multiplanar CT image reconstructions and MIPs were obtained to evaluate the vascular anatomy. CONTRAST:  70mL OMNIPAQUE IOHEXOL 350 MG/ML SOLN COMPARISON:  CT scan 09/21/2020 FINDINGS: Cardiovascular: The heart is mildly enlarged but stable. No pericardial effusion. Stable tortuosity and mild ectasia of the thoracic aorta but no aneurysm or dissection. Minimal atherosclerotic calcification at the aortic arch. The branch vessels are patent. No definite coronary artery calcifications. The pulmonary arterial tree is well opacified. Small bandlike filling defect noted in the proximal right upper lobe pulmonary artery in an area of a prior pulmonary embolus consistent with a fibrotic band. No findings suspicious for acute PE. Mediastinum/Nodes: There is a stable fluid collection noted near the left atrium and inferior pulmonary vein on the right side. This may be some type of epicardial cyst. No mediastinal or hilar adenopathy. The esophagus is grossly. Lungs/Pleura: Chronic appearing patchy mosaic pattern of ground-glass attenuation could be asymmetric pulmonary edema, small  airways disease such as asthma or respiratory bronchiolitis. Other possibilities would include cryptogenic organizing pneumonia or hypersensitivity pneumonitis. There are also small bilateral pleural effusions and overlying atelectasis. No worrisome pulmonary lesions. Upper Abdomen: No significant upper abdominal findings. Musculoskeletal: No breast masses are identified. There is a complex appearing fluid collection containing gas involving the right chest wall anteriorly. The upper part of this abscess is anterior to  the thyroid gland and it appears it may communicate with a second lower abscess near the right sternoclavicular joint and anterior to the right clavicular head. Do not see any definite CT findings for septic arthritis at the right  joint that would be a possibility. This measures a maximum of 7.5 cm. Aspiration may be helpful for diagnostic and therapeutic purposes. MRI examination of the chest wall may be helpful for further evaluation. Review of the MIP images confirms the above findings. IMPRESSION: 1. Small bandlike filling defect in the proximal right upper lobe pulmonary artery in an area of a prior pulmonary embolus consistent with a fibrotic band. No findings suspicious for acute PE. 2. Stable tortuosity and mild ectasia of the thoracic aorta but no aneurysm or dissection. 3. Chronic appearing patchy mosaic pattern of ground-glass attenuation could be asymmetric pulmonary edema, small airways disease such as asthma or respiratory bronchiolitis. Other possibilities would include cryptogenic organizing pneumonia or hypersensitivity pneumonitis. 4. Small bilateral pleural effusions and overlying atelectasis. 5. 7.5 cm anterior chest wall abscess of uncertain origin. Right sternoclavicular joint septic arthritis would be a possibility although I do not see any definite CT evidence of this. MRI may be helpful for further evaluation. Electronically Signed   By: Marijo Sanes M.D.   On: 02/28/2021 05:47   DG Chest Port 1 View  Result Date: 02/28/2021 CLINICAL DATA:  Shortness of breath for 1 week.  Chest pain. EXAM: PORTABLE CHEST 1 VIEW COMPARISON:  CT 09/21/2020 FINDINGS: Shallow inspiration. Mild cardiac enlargement. No vascular congestion, edema, or consolidation. Prominence of the mediastinum is unchanged since prior study and correlates to a combination of prominent mediastinal fat and lymphadenopathy. No airspace disease or consolidation in the lungs. IMPRESSION: Shallow inspiration. Cardiac  enlargement. Prominent mediastinal structures correspond to mediastinal fat and lymphadenopathy on previous CT. No active pulmonary disease. Electronically Signed   By: Lucienne Capers M.D.   On: 02/28/2021 00:38    Anti-infectives: Anti-infectives (From admission, onward)    None        Assessment/Plan Sacral pressure injury, stage IV - continue hydrotherapy - will see again with hydrotherapy Monday  - recommend air overlay mattress and frequent turning  - BID wet to dry dressing with santyl, making sure to pack into areas where undermining is present as well  - I do not feel patient requires abx at this time, does not appear infected - no acute surgical debridement indicated at this time    FEN: ok to have a diet from a surgical standpoint  VTE: SCDs, eliquis on hold  ID: no current abx   Hx of perforated sigmoid colon - S/P Hartmann's in 08/2020 Colostomy in place HTN HLD Hx of PE SOB/chest pain - work up per primary service  Hypokalemia - resolved Uncontrolled T2DM Neurosarcoidosis Seizures OSA not on CPAP Oral thrush Obesity - BMI 31.53 DNR  LOS: 1 day    Norm Parcel, [facility] 03/01/2021, 8:00 AM Please see Amion for pager number during day hours 7:00am-4:30pm

## 2021-03-01 NOTE — Progress Notes (Signed)
Bear hugger ordered.

## 2021-03-01 NOTE — Progress Notes (Signed)
[facility]  PROGRESS NOTE  Rhonda [name] YIR:[id number] DOB: 11-30-1960 DOA: 02/27/2021 PCP: [place] HPI:   60 year old female with medical history of hypertension, OSA not on CPAP, prediabetes, neurosarcoidosis, colostomy, seizures presents with shortness of breath. She was hospitalized in 5/22 with perforated sigmoid colon leading to emergency exploratory laparotomy with Hartman's procedure, she has delayed wound healing with wound VAC placement abdominal wound is slowly improving over time. Patient has history of neurosarcoid, debilitated, bedbound, has large sacral wound.  General surgery was consulted for debridement of sacral ulcer stage IV    Subjective   Patient underwent debridement of sacral ulcer yesterday, this morning has been lethargic, hardly responds to verbal stimuli.  Vitals are stable.   Assessment/Plan:    Severe sepsis -Patient developed altered mental status this morning, vital signs were stable -Lactic acid obtained initially was 1.6, repeat 6.0 -Blood cultures x2 were obtained, UA was abnormal, urine culture obtained -Source of infection likely decubitus ulcer versus UTI -Patient started on vancomycin and Zosyn per pharmacy -As patient blood pressure is stable, no need of 30 cc/kg IV fluids to avoid fluid overload  Dysphagia -Swallow evaluation obtained - patient started on dysphagia 3 diet  Altered mental status -Likely from above -We will continue to monitor  Sacral pressure ulcer stage IV -General surgery did debridement yesterday -Wound care following  Diabetes mellitus type 2 -Recent hemoglobin A1c was 7.8 -Continue Lantus, moderate sliding scale insulin with NovoLog -CBG has been low -We will start D5 half-normal saline at 75 mill per hour -Patient was on Lantus 28 units in the morning and 25 units in p.m. -We will discontinue morning Lantus 28 units subcu daily and continue with Lantus 25 units subcu at  bedtime  History of pulmonary embolism -Eliquis was held yesterday due to thrombocytopenia -We will continue to monitor patient's platelet count -Consider restarting Eliquis if platelet count remains stable  Hypertension -Blood pressure is stable, though on lower side -We will hold Lopressor  Neurosarcoidosis -Patient is nonambulatory -Continue prednisone 40 mg twice daily -Palliative care has been consulted  Obstructive sleep apnea -Patient not on CPAP   Scheduled medications:    atorvastatin  80 mg Oral q1800   chlorhexidine  15 mL Mouth Rinse BID   collagenase   Topical BID   feeding supplement (GLUCERNA SHAKE)  237 mL Oral BID BM   ferrous sulfate  325 mg Oral Q breakfast   gabapentin  100 mg Oral TID   insulin aspart  0-15 Units Subcutaneous TID WC   insulin aspart  0-5 Units Subcutaneous QHS   insulin glargine-yfgn  25 Units Subcutaneous QHS   insulin glargine-yfgn  28 Units Subcutaneous q AM   mouth rinse  15 mL Mouth Rinse q12n4p   melatonin  10 mg Oral QHS   metoprolol tartrate  25 mg Oral BID   multivitamin with minerals  1 tablet Oral Daily   nystatin  5 mL Oral QID   pantoprazole  40 mg Oral Daily   predniSONE  40 mg Oral BID WC   silver nitrate applicators  1 application Topical Once   sodium chloride flush  3 mL Intravenous Q12H     Data Reviewed:   CBG:  Recent Labs  Lab 02/28/21 1622 02/28/21 2146 03/01/21 0734 03/01/21 1140 03/01/21 1201  GLUCAP 254* 207* 122* 69* 79    SpO2: 95 %    Vitals:   03/01/21 0300 03/01/21 0619 03/01/21 0924 03/01/21 1345  BP:  118/84 (!) 112/93 114/88  Pulse:  76 (!) 53 60  Resp: 20 20    Temp:   (!) 97.5 F (36.4 C) 98.2 F (36.8 C)  TempSrc:   Oral Oral  SpO2:  91%  95%  Weight:      Height:         Intake/Output Summary (Last 24 hours) at 03/01/2021 1550 Last data filed at 03/01/2021 1522 Gross per 24 hour  Intake 830 ml  Output 100 ml  Net 730 ml    10/12 1901 - 10/14 0700 In: 480  [P.O.:480] Out: 100   Filed Weights   02/27/21 2353  Weight: 80.7 kg    Data Reviewed: Basic Metabolic Panel: Recent Labs  Lab 02/28/21 0018 03/01/21 0504  NA 127* 131*  K 2.5* 4.2  CL 88* 90*  CO2 29 30  GLUCOSE 372* 120*  BUN 6 11  CREATININE 0.50 0.52  CALCIUM 8.1* 9.1  MG 1.7  --    Liver Function Tests: No results for input(s): AST, ALT, ALKPHOS, BILITOT, PROT, ALBUMIN in the last 168 hours. No results for input(s): LIPASE, AMYLASE in the last 168 hours. No results for input(s): AMMONIA in the last 168 hours. CBC: Recent Labs  Lab 02/28/21 0018 03/01/21 0504  WBC 13.1* 13.5*  NEUTROABS 12.3*  --   HGB 10.8* 12.2  HCT 35.1* 39.6  MCV 92.9 90.2  PLT 56* 40*   Cardiac Enzymes: No results for input(s): CKTOTAL, CKMB, CKMBINDEX, TROPONINI in the last 168 hours. BNP (last 3 results) Recent Labs    11/23/20 0235 11/24/20 0450 02/28/21 0018  BNP 160.8* 152.6* 440.8*    ProBNP (last 3 results) No results for input(s): PROBNP in the last 8760 hours.  CBG: Recent Labs  Lab 02/28/21 1622 02/28/21 2146 03/01/21 0734 03/01/21 1140 03/01/21 1201  GLUCAP 254* 207* 122* 69* 79       Radiology Reports  CT Angio Chest PE W and/or Wo Contrast  Result Date: 02/28/2021 CLINICAL DATA:  Shortness of breath. EXAM: CT ANGIOGRAPHY CHEST WITH CONTRAST TECHNIQUE: Multidetector CT imaging of the chest was performed using the standard protocol during bolus administration of intravenous contrast. Multiplanar CT image reconstructions and MIPs were obtained to evaluate the vascular anatomy. CONTRAST:  60mL OMNIPAQUE IOHEXOL 350 MG/ML SOLN COMPARISON:  CT scan 09/21/2020 FINDINGS: Cardiovascular: The heart is mildly enlarged but stable. No pericardial effusion. Stable tortuosity and mild ectasia of the thoracic aorta but no aneurysm or dissection. Minimal atherosclerotic calcification at the aortic arch. The branch vessels are patent. No definite coronary artery  calcifications. The pulmonary arterial tree is well opacified. Small bandlike filling defect noted in the proximal right upper lobe pulmonary artery in an area of a prior pulmonary embolus consistent with a fibrotic band. No findings suspicious for acute PE. Mediastinum/Nodes: There is a stable fluid collection noted near the left atrium and inferior pulmonary vein on the right side. This may be some type of epicardial cyst. No mediastinal or hilar adenopathy. The esophagus is grossly. Lungs/Pleura: Chronic appearing patchy mosaic pattern of ground-glass attenuation could be asymmetric pulmonary edema, small airways disease such as asthma or respiratory bronchiolitis. Other possibilities would include cryptogenic organizing pneumonia or hypersensitivity pneumonitis. There are also small bilateral pleural effusions and overlying atelectasis. No worrisome pulmonary lesions. Upper Abdomen: No significant upper abdominal findings. Musculoskeletal: No breast masses are identified. There is a complex appearing fluid collection containing gas involving the right chest wall anteriorly. The upper part of  this abscess is anterior to the thyroid gland and it appears it may communicate with a second lower abscess near the right sternoclavicular joint and anterior to the right clavicular head. Do not see any definite CT findings for septic arthritis at the right [place] joint that would be a possibility. This measures a maximum of 7.5 cm. Aspiration may be helpful for diagnostic and therapeutic purposes. MRI examination of the chest wall may be helpful for further evaluation. Review of the MIP images confirms the above findings. IMPRESSION: 1. Small bandlike filling defect in the proximal right upper lobe pulmonary artery in an area of a prior pulmonary embolus consistent with a fibrotic band. No findings suspicious for acute PE. 2. Stable tortuosity and mild ectasia of the thoracic aorta but no aneurysm or dissection. 3. Chronic  appearing patchy mosaic pattern of ground-glass attenuation could be asymmetric pulmonary edema, small airways disease such as asthma or respiratory bronchiolitis. Other possibilities would include cryptogenic organizing pneumonia or hypersensitivity pneumonitis. 4. Small bilateral pleural effusions and overlying atelectasis. 5. 7.5 cm anterior chest wall abscess of uncertain origin. Right sternoclavicular joint septic arthritis would be a possibility although I do not see any definite CT evidence of this. MRI may be helpful for further evaluation. Electronically Signed   By: Marijo Sanes M.D.   On: 02/28/2021 05:47   DG Chest Port 1 View  Result Date: 02/28/2021 CLINICAL DATA:  Shortness of breath for 1 week.  Chest pain. EXAM: PORTABLE CHEST 1 VIEW COMPARISON:  CT 09/21/2020 FINDINGS: Shallow inspiration. Mild cardiac enlargement. No vascular congestion, edema, or consolidation. Prominence of the mediastinum is unchanged since prior study and correlates to a combination of prominent mediastinal fat and lymphadenopathy. No airspace disease or consolidation in the lungs. IMPRESSION: Shallow inspiration. Cardiac enlargement. Prominent mediastinal structures correspond to mediastinal fat and lymphadenopathy on previous CT. No active pulmonary disease. Electronically Signed   By: Lucienne Capers M.D.   On: 02/28/2021 00:38       Antibiotics: Anti-infectives (From admission, onward)    Start     Dose/Rate Route Frequency Ordered Stop   03/02/21 1200  vancomycin (VANCOCIN) IVPB 1000 mg/200 mL premix        1,000 mg 200 mL/hr over 60 Minutes Intravenous Every 24 hours 03/01/21 1326     03/01/21 1230  vancomycin (VANCOREADY) IVPB 1750 mg/350 mL        1,750 mg 175 mL/hr over 120 Minutes Intravenous  Once 03/01/21 1123 03/01/21 1459   03/01/21 1200  piperacillin-tazobactam (ZOSYN) IVPB 3.375 g        3.375 g 12.5 mL/hr over 240 Minutes Intravenous Every 8 hours 03/01/21 1123           DVT  prophylaxis: SCDs  Code Status: DNR  Family Communication: No family at bedside   Consultants: General surgery  Procedures: Debridement of sacral pressure ulcer stage IV    Objective    Physical Examination:   General: Lethargic Cardiovascular: S1-S2, regular, no murmur auscultated Respiratory: Clear to auscultation bilaterally Abdomen: Abdomen is soft, nontender, no organomegaly Extremities: No edema in the lower extremities Neurologic: Alert, very slow to respond to verbal stimuli, appears lethargic   Status is: Inpatient  Dispo: The patient is from: Home              Anticipated d/c is to: To be decided              Anticipated d/c date is: 03/05/2021  Patient currently not stable for discharge  Barrier to discharge-severe sepsis  COVID-19 Labs  Recent Labs    03/01/21 0504  FERRITIN 3,143*    Lab Results  Component Value Date   SARSCOV2NAA NEGATIVE 02/28/2021   [place] NEGATIVE 11/22/2020   [place] NEGATIVE 09/27/2020   [place] NEGATIVE 08/29/2020     Pressure Injury 02/28/21 Coccyx Mid;Lower Stage 4 - Full thickness tissue loss with exposed bone, tendon or muscle. pink wound, necrotic tissue present, drainage around sacral wound with moderate serosanguineous drainage. (Active)  02/28/21 1050  Location: Coccyx  Location Orientation: Mid;Lower  Staging: Stage 4 - Full thickness tissue loss with exposed bone, tendon or muscle.  Wound Description (Comments): pink wound, necrotic tissue present, drainage around sacral wound with moderate serosanguineous drainage.  Present on Admission: Yes        Recent Results (from the past 240 hour(s))  Resp Panel by RT-PCR (Flu A&B, Covid) Nasopharyngeal Swab     Status: None   Collection Time: 02/28/21  1:55 AM   Specimen: Nasopharyngeal Swab; Nasopharyngeal(NP) swabs in vial transport medium  Result Value Ref Range Status   SARS Coronavirus 2 by RT PCR NEGATIVE NEGATIVE Final     Comment: (NOTE) SARS-CoV-2 target nucleic acids are NOT DETECTED.  The SARS-CoV-2 RNA is generally detectable in upper respiratory specimens during the acute phase of infection. The lowest concentration of SARS-CoV-2 viral copies this assay can detect is 138 copies/mL. A negative result does not preclude SARS-Cov-2 infection and should not be used as the sole basis for treatment or other patient management decisions. A negative result may occur with  improper specimen collection/handling, submission of specimen other than nasopharyngeal swab, presence of viral mutation(s) within the areas targeted by this assay, and inadequate number of viral copies(<138 copies/mL). A negative result must be combined with clinical observations, patient history, and epidemiological information. The expected result is Negative.  Fact Sheet for Patients:  EntrepreneurPulse.com.au  Fact Sheet for Healthcare Providers:  IncredibleEmployment.be  This test is no t yet approved or cleared by the [place] FDA and  has been authorized for detection and/or diagnosis of SARS-CoV-2 by FDA under an Emergency Use Authorization (EUA). This EUA will remain  in effect (meaning this test can be used) for the duration of the COVID-19 declaration under Section 564(b)(1) of the Act, 21 U.S.C.section 360bbb-3(b)(1), unless the authorization is terminated  or revoked sooner.       Influenza A by PCR NEGATIVE NEGATIVE Final   Influenza B by PCR NEGATIVE NEGATIVE Final    Comment: (NOTE) The Xpert Xpress SARS-CoV-2/FLU/RSV plus assay is intended as an aid in the diagnosis of influenza from Nasopharyngeal swab specimens and should not be used as a sole basis for treatment. Nasal washings and aspirates are unacceptable for Xpert Xpress SARS-CoV-2/FLU/RSV testing.  Fact Sheet for Patients: EntrepreneurPulse.com.au  Fact Sheet for Healthcare  Providers: IncredibleEmployment.be  This test is not yet approved or cleared by the [place] FDA and has been authorized for detection and/or diagnosis of SARS-CoV-2 by FDA under an Emergency Use Authorization (EUA). This EUA will remain in effect (meaning this test can be used) for the duration of the COVID-19 declaration under Section 564(b)(1) of the Act, 21 U.S.C. section 360bbb-3(b)(1), unless the authorization is terminated or revoked.  Performed at [facility], [place] [address] [place], [place] [address]     [facility] If 7PM-7AM, please contact night-coverage at [email or web address], Office  [phone]  03/01/2021, 3:50 PM  LOS: 1 day

## 2021-03-01 NOTE — Progress Notes (Signed)
Speech Language Pathology Treatment: Dysphagia  Patient Details Name: Rhonda [name] MRN: [id number] DOB: [birth year] Today's Date: 03/01/2021 Time: 7290-2111 SLP Time Calculation (min) (ACUTE ONLY): 12 min  Assessment / Plan / Recommendation Clinical Impression  F/u after yesterday's swallowing assessment.  Pt difficult to rouse today; struggling to keep her eyes open despite cold wet cloth to face and attempts to awaken. Her husband is at bedside.  She accepted several sips of water with max prompts, but was not sufficiently alert to try any solid foods. There were no overt s/s of aspiration with liquids. Called RN and let her know.    Continue dysphagia 3, thin liquids as able. Give meds whole in puree if she coughs with water. SL will follow for safety/education.   HPI HPI: Rhonda [name] is a 60 y.o. female with medical history significant of HTN; OSA not on CPAP; prediabetes; neurosarcoidosis; colostomy; and seizures presenting with SOB.  She has been having trouble breathing and complaining of chest pain.    Appears to be epigastric when she points to it.  +cough, nonproductive.  She does throat clearing after drinking water per MD note. CT angio shows Chronic appearing patchy mosaic pattern of ground-glass  attenuation could be asymmetric pulmonary edema, small airways  disease such as asthma or respiratory bronchiolitis. Other  possibilities would include cryptogenic organizing pneumonia or  hypersensitivity pneumonitis.      SLP Plan  Continue with current plan of care      Recommendations for follow up therapy are one component of a multi-disciplinary discharge planning process, led by the attending physician.  Recommendations may be updated based on patient status, additional functional criteria and insurance authorization.    Recommendations  Diet recommendations: Dysphagia 3 (mechanical soft);Thin liquid Liquids provided via: Cup;Straw Medication Administration: Whole meds  with liquid Supervision: Staff to assist with self feeding                Oral Care Recommendations: Oral care BID Follow up Recommendations: 24 hour supervision/assistance SLP Visit Diagnosis: Dysphagia, unspecified (R13.10) Plan: Continue with current plan of care       [name], [place] Office number [phone] Pager [phone]                 Rhonda [name]  03/01/2021, 10:54 AM

## 2021-03-01 NOTE — Consult Note (Signed)
Palliative Medicine Inpatient Consult Note  Consulting Provider: Karmen Bongo, MD  Reason for consult:   [name] Palliative Medicine Consult  Reason for Consult? goals of care    HPI:  Per intake H&P --> Rhonda [name] is a 60 y.o. female with medical history significant of HTN; OSA not on CPAP; prediabetes; neurosarcoidosis; colostomy; and seizures presenting with SOB.  She has been having trouble breathing since last night and complaining of chest pain.  Symptoms started yesterday AM and progressed all day and night.  Appears to be epigastric when she points to it.  +cough, nonproductive.  She does throat clearing after drinking water.  No change in LE edema. No fever.  No sick contacts.  The pain is currently resolved.  She has a sacral pressure ulcer that her husband treats.  Palliative care has been asked to get involved to further address goals of care.  Clinical Assessment/Goals of Care:  *Please note that this is a verbal dictation therefore any spelling or grammatical errors are due to the "[employer]" system interpretation.  I have reviewed medical records including EPIC notes, labs and imaging, received report from bedside RN, assessed the patient who is lying in bed slow to respond.   I called patient's spouse, Rhonda [name] over the phone to further discuss diagnosis prognosis, [name], EOL wishes, disposition and options.  I discussed with [place]onda [name] past medical conditions inclusive of her diabetes, neurosarcoidosis, obstructive sleep apnea, stage IV pressure injury, perforated sigmoid colon.  I discussed with Rhonda [name] the concerns of myself and the medical team's regarding [name] poor physical and functional state.  We reviewed that the integumentary system is her body's largest organ system and when that starts to breakdown a cascade of worsening events can occur.   I introduced Palliative Medicine as specialized medical care for people living with  serious illness. It focuses on providing relief from the symptoms and stress of a serious illness. The goal is to improve quality of life for both the patient and the family.  [place]onda [name] shares with me that Why lives in [name], [place].  They have been married for the past 40 years.  They share 4 children and 11 grandchildren with 1 another.  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  She gets great joy out of spending time with her family.  She is a woman of strong faith and practices within the [name] denomination of Christianity.  Prior to hospitalization and over the past 3 months Rhonda [name] has been declining from an overall functional perspective.  Her spouse, [place]onda [name] performs all basic activities of daily living for her.  From a nutritional perspective Rhonda [name] has remained to have a fairly robust appetite.  A detailed discussion was had today regarding advanced directives there are none on file.    Concepts specific to code status, artifical feeding and hydration, continued IV antibiotics and rehospitalization was had.  Accounting for all of Rhonda [name]'s other comorbid conditions Rh[name] shares that he had spoken with the medical team and she is a DO NOT RESUSCITATE CODE STATUS.  He would though appreciate if her condition presently can be improved upon and is open to additional antibiotics and fluids as offered.  The difference between a aggressive medical intervention path  and a palliative comfort care path for this patient at this time was had.  We reviewed to continue to take the good days as they are and hope for ongoing improvements.  I shared candidly that Rhonda [name]'s present  situation is one whereby she could decline quickly.  We discussed that presently there is a work-up in progress for active infection given [place]onda [name]'s altered mental state.  Rhonda [name] shares that people keep sharing "how sick Rhonda [name] is" but he does not understand this.  I was able to open Rhonda [name]'s chart and review all of her present and  past medical illnesses with him to provide better clarity at which time he shared "yes she is very sick".  Goals for the time being are to optimize [name] situation as well as possible.  Rhonda [name] would be open to additional surgical procedures for debriding of her sacral wound if it comes to that.  Discussed the importance of continued conversation with family and their  medical providers regarding overall plan of care and treatment options, ensuring decisions are within the context of the patients values and GOCs.  Provided  "Hard Choices for [employer]" booklet.   Decision Maker: [name] (spouse):  [phone]  SUMMARY OF RECOMMENDATIONS   DNAR/DNI  An open and honest conversation was held in regards to [place] acute on chronic medical problems with her spouse, [place]onda [name].  Goals: Optimize medically as much as possible to transition back home  TOC - OP palliative support upon discharge  The palliative medicine team will continue to follow along with [place]onda [name] during hospitalization  Code Status/Advance Care Planning: DNAR/DNI   Palliative Prophylaxis:  Oral care, wound care, delirium prevention  Additional Recommendations (Limitations, Scope, Preferences): Continue to treat what is treatable    Psycho-social/Spiritual:  Desire for further Chaplaincy support: No, patient's spouse is a Theme park manager Additional Recommendations: Education on chronic comorbid conditions   Prognosis: Prognosis is worrisome in the setting of a stage IV decubitus ulcer in the setting of immobility, hypoalbuminemia, in addition to multiple chronic comorbid conditions.  Rhonda [name] has an exceptionally high 33-month mortality risk.  Discharge Planning: Discharge to home with home health and outpatient palliative support.  Vitals:   03/01/21 0300 03/01/21 0619  BP:  118/84  Pulse:  76  Resp: 20 20  Temp:    SpO2:  91%    Intake/Output Summary (Last 24 hours) at 03/01/2021 6578 Last data filed at 03/01/2021  4696 Gross per 24 hour  Intake 480 ml  Output 100 ml  Net 380 ml   Last Weight  Most recent update: 02/27/2021 11:54 PM    Weight  80.7 kg (178 lb)           Gen: Very ill-appearing older African-American female HEENT: Dry, white patches present on tongue CV: Regular rate and rhythm PULM: On room air ABD: Colostomy pouch in left lower quadrant, mid abdominal dressing in place EXT: Right foot pedal edema Neuro: Alert and oriented x1 -slow to verbally respond  PPS: 10%   This conversation/these recommendations were discussed with patient primary care team, Dr. Darrick Meigs  Time In: 0830 Time Out: 0940 Total Time: 70 Greater than 50%  of this time was spent counseling and coordinating care related to the above assessment and plan.  [place] Team Team Cell Phone: [phone] Please utilize secure chat with additional questions, if there is no response within 30 minutes please call the above phone number  Palliative Medicine Team providers are available by phone from 7am to 7pm daily and can be reached through the team cell phone.  Should this patient require assistance outside of these hours, please call the patient's attending physician.

## 2021-03-01 NOTE — Progress Notes (Signed)
Pharmacy Antibiotic Note  Rhonda [name] is a 60 y.o. female admitted on 02/27/2021 with shortness of breath and sacral pressure ulcer.  Pharmacy has been consulted for Vancomycin and Zosyn dosing for ?wound infection and ?aspiration pna.  Plan: Vancomycin 1750mg  IV x 1 loading dose Vancomycin 1gm IV q24h SCr 0.8 (round up from 0.5), Vd 0.5 (BMI 31), est AUC 444.6 Zosyn 3.375 gm IV q8h (4 hour infusion). Follow-up abx plan and consider changing to Cefepime to reduce AKI risk.  Height: 5\' 3"  (160 cm) Weight: 80.7 kg (178 lb) IBW/kg (Calculated) : 52.4  Temp (24hrs), Avg:97.9 F (36.6 C), Min:97.5 F (36.4 C), Max:98.2 F (36.8 C)  Recent Labs  Lab 02/28/21 0018 03/01/21 0504 03/01/21 0945  WBC 13.1* 13.5*  --   CREATININE 0.50 0.52  --   LATICACIDVEN  --   --  1.6    Estimated Creatinine Clearance: 75.2 mL/min (by C-G formula based on SCr of 0.52 mg/dL).    Allergies  Allergen Reactions   Aspirin Swelling   Augmentin [Amoxicillin-Pot Clavulanate] Swelling and Other (See Comments)    **patient has tolerated piperacillin/tazobactam, cephalexin, and ceftriaxone Hand swelled at IV site - likely extravasation    Antimicrobials this admission: Vanc 10/14 >> Zosyn 10/14 >>  Dose adjustments this admission:   Microbiology results: 10/14 BCx:    Thank you for allowing pharmacy to be a part of this patient's care.  [employer], Pharm.D., BCPS Clinical Pharmacist  **Pharmacist phone directory can be found on [email or web address] listed under [place].  03/01/2021 1:24 PM

## 2021-03-02 DIAGNOSIS — I10 Essential (primary) hypertension: Secondary | ICD-10-CM | POA: Diagnosis not present

## 2021-03-02 DIAGNOSIS — Z7189 Other specified counseling: Secondary | ICD-10-CM | POA: Diagnosis not present

## 2021-03-02 DIAGNOSIS — A4101 Sepsis due to Methicillin susceptible Staphylococcus aureus: Secondary | ICD-10-CM

## 2021-03-02 DIAGNOSIS — Z66 Do not resuscitate: Secondary | ICD-10-CM | POA: Diagnosis not present

## 2021-03-02 DIAGNOSIS — R0602 Shortness of breath: Secondary | ICD-10-CM

## 2021-03-02 DIAGNOSIS — J9601 Acute respiratory failure with hypoxia: Secondary | ICD-10-CM | POA: Diagnosis not present

## 2021-03-02 DIAGNOSIS — Z515 Encounter for palliative care: Secondary | ICD-10-CM | POA: Diagnosis not present

## 2021-03-02 LAB — BLOOD CULTURE ID PANEL (REFLEXED) - BCID2

## 2021-03-02 LAB — GLUCOSE, CAPILLARY
Glucose-Capillary: 82 mg/dL (ref 70–99)
Glucose-Capillary: 120 mg/dL — ABNORMAL HIGH (ref 70–99)
Glucose-Capillary: 148 mg/dL — ABNORMAL HIGH (ref 70–99)
Glucose-Capillary: 152 mg/dL — ABNORMAL HIGH (ref 70–99)

## 2021-03-02 LAB — CBC
HCT: 36.6 % (ref 36.0–46.0)
Hemoglobin: 11.3 g/dL — ABNORMAL LOW (ref 12.0–15.0)
MCH: 28 pg (ref 26.0–34.0)
MCHC: 30.9 g/dL (ref 30.0–36.0)
MCV: 90.8 fL (ref 80.0–100.0)
Platelets: 30 10*3/uL — ABNORMAL LOW (ref 150–400)
RBC: 4.03 MIL/uL (ref 3.87–5.11)
RDW: 16.3 % — ABNORMAL HIGH (ref 11.5–15.5)
WBC: 14.2 10*3/uL — ABNORMAL HIGH (ref 4.0–10.5)
nRBC: 2.1 % — ABNORMAL HIGH (ref 0.0–0.2)

## 2021-03-02 LAB — BASIC METABOLIC PANEL
Anion gap: 16 — ABNORMAL HIGH (ref 5–15)
BUN: 17 mg/dL (ref 6–20)
CO2: 19 mmol/L — ABNORMAL LOW (ref 22–32)
Calcium: 8.6 mg/dL — ABNORMAL LOW (ref 8.9–10.3)
Chloride: 94 mmol/L — ABNORMAL LOW (ref 98–111)
Creatinine, Ser: 0.78 mg/dL (ref 0.44–1.00)
GFR, Estimated: >60 mL/min (ref >60)
Glucose, Bld: 98 mg/dL (ref 70–99)
Potassium: 4.8 mmol/L (ref 3.5–5.1)
Sodium: 129 mmol/L — ABNORMAL LOW (ref 135–145)

## 2021-03-02 LAB — COPPER, SERUM: Copper: 68 ug/dL — ABNORMAL LOW (ref 80–158)

## 2021-03-02 LAB — ZINC: Zinc: 54 ug/dL (ref 44–115)

## 2021-03-02 MED ORDER — SODIUM CHLORIDE 0.9 % IV SOLN
3.0000 g | Freq: Four times a day (QID) | INTRAVENOUS | Status: DC
  Administered 2021-03-02 – 2021-03-05 (×11): 3 g via INTRAVENOUS
  Filled 2021-03-02 (×12): qty 8

## 2021-03-02 MED ORDER — HYDROCORTISONE SOD SUC (PF) 100 MG IJ SOLR
100.0000 mg | Freq: Three times a day (TID) | INTRAMUSCULAR | Status: DC
  Administered 2021-03-02 – 2021-03-06 (×12): 100 mg via INTRAVENOUS
  Filled 2021-03-02 (×12): qty 2

## 2021-03-02 MED ORDER — INSULIN GLARGINE-YFGN 100 UNIT/ML ~~LOC~~ SOLN
10.0000 [IU] | Freq: Every day | SUBCUTANEOUS | Status: DC
  Administered 2021-03-02 – 2021-03-05 (×4): 10 [IU] via SUBCUTANEOUS
  Filled 2021-03-02 (×5): qty 0.1

## 2021-03-02 NOTE — Progress Notes (Signed)
Physical Therapy Wound Treatment Patient Details  Name: Rhonda [name] MRN: [id number] Date of Birth: [birth year]  Today's Date: 03/02/2021 Time: 280-034    Subjective  Subjective Assessment Subjective: patient moaning but no verbalization Patient and Family Stated Goals: Not stated Prior Treatments: Unknown  Pain Score:    Wound Assessment  Pressure Injury 02/28/21 Coccyx Mid;Lower Stage 4 - Full thickness tissue loss with exposed bone, tendon or muscle. pink wound, necrotic tissue present, drainage around sacral wound with moderate serosanguineous drainage. (Active)  Wound Image   03/01/21 0800  Dressing Type Gauze (Comment);Moist to dry;Santyl;Foam - Lift dressing to assess site every shift 03/02/21 0950  Dressing Changed 03/02/21 0950  Dressing Change Frequency Twice a day 03/02/21 0950  State of Healing Non-healing 03/02/21 0950  Site / Wound Assessment Black;Brown;Yellow;Pink 03/02/21 0950  % Wound base Red or Granulating 55% 03/02/21 0950  % Wound base Yellow/Fibrinous Exudate 10% 03/02/21 0950  % Wound base Black/Eschar 35% 03/02/21 0950  % Wound base Other/Granulation Tissue (Comment) 0% 03/02/21 0950  Peri-wound Assessment Intact 03/02/21 0950  Wound Length (cm) 10 cm 03/01/21 0800  Wound Width (cm) 6 cm 03/01/21 0800  Wound Depth (cm) 4 cm 03/01/21 0800  Wound Surface Area (cm^2) 60 cm^2 03/01/21 0800  Wound Volume (cm^3) 240 cm^3 03/01/21 0800  Tunneling (cm) 7 02/28/21 1050  Undermining (cm) 2-5 cm undermining 8 o'clock to 3 o'clock 03/01/21 0800  Margins Attached edges (approximated) 03/02/21 0950  Drainage Amount Minimal 03/02/21 0950  Drainage Description Serosanguineous;Odor 03/02/21 0950  Treatment Debridement (Selective);Hydrotherapy (Pulse lavage);Packing (Saline gauze) 03/02/21 0950      Hydrotherapy Pulsed lavage therapy - wound location: sacrum Pulsed Lavage with Suction (psi): 12 psi Pulsed Lavage with Suction - Normal Saline Used: 1000 mL Pulsed  Lavage Tip: Tip with splash shield Selective Debridement Selective Debridement - Location: sacrum Selective Debridement - Tools Used: Forceps, Scissors Selective Debridement - Tissue Removed: black necrotic tissue    Wound Assessment and Plan  Wound Therapy - Assess/Plan/Recommendations Wound Therapy - Clinical Statement: Pt presents to hydrotherapy with large sacral wound that can benefit from hydrotherapy for removal of nonviable tissue and to promote wound healing. Wound Therapy - Functional Problem List: Decreased tolerance for sitting Factors Delaying/Impairing Wound Healing: Incontinence, Immobility, Multiple medical problems, Polypharmacy Hydrotherapy Plan: Debridement, Dressing change, Patient/family education, Pulsatile lavage with suction Wound Therapy - Frequency: 6X / week Wound Therapy - Follow Up Recommendations: dressing changes by RN, dressing changes by family/patient  Wound Therapy Goals- Improve the function of patient's integumentary system by progressing the wound(s) through the phases of wound healing (inflammation - proliferation - remodeling) by: Wound Therapy Goals - Improve the function of patient's integumentary system by progressing the wound(s) through the phases of wound healing by: Decrease Necrotic Tissue to: 20 Decrease Necrotic Tissue - Progress: Progressing toward goal Increase Granulation Tissue to: 80 Increase Granulation Tissue - Progress: Progressing toward goal  Goals will be updated until maximal potential achieved or discharge criteria met.  Discharge criteria: when goals achieved, discharge from hospital, MD decision/surgical intervention, no progress towards goals, refusal/missing three consecutive treatments without notification or medical reason.  GP     Charges         Shanna Cisco 03/02/2021, 9:55 AM

## 2021-03-02 NOTE — Progress Notes (Signed)
[facility]  PROGRESS NOTE  Rhonda [name] ZES:[id number] DOB: [birth year] DOA: 02/27/2021 PCP: [facility] HPI:   60 year old female with medical history of hypertension, OSA not on CPAP, prediabetes, neurosarcoidosis, colostomy, seizures presents with shortness of breath. She was hospitalized in 5/22 with perforated sigmoid colon leading to emergency exploratory laparotomy with Hartman's procedure, she has delayed wound healing with wound VAC placement abdominal wound is slowly improving over time. Patient has history of neurosarcoid, debilitated, bedbound, has large sacral wound.  General surgery was consulted for debridement of sacral ulcer stage IV    Subjective   Patient seen and examined, continues to be lethargic.  Required bair hugger for hypothermia Lactic acid was elevated to 6.0.  Blood pressure has been stable.   Assessment/Plan:    Severe sepsis/MRSA bacteremia -Patient presented with shortness of breath -Was seen by general surgery and underwent debridement of stage IV sacral pressure ulcer -On 03/01/2021, patient developed altered mental status -Sepsis work-up was initiated, lactic acid initially was 1.6, repeat lactic acid was 6.0 -Blood and urine cultures were obtained.  MRSA growing in all 4 bottles from blood cultures -Patient was empirically started on vancomycin and Zosyn on 03/01/2021 -Source of infection likely decubitus ulcer  -As patient blood pressure is stable, no need of 30 cc/kg IV fluids to avoid fluid overload  ?  Chest wall abscess -CTA chest showed 7.5 cm anterior chest wall abscess of uncertain origin -Questionable sternoclavicular joint septic arthritis -MRI chest without contrast ordered by ID -We will follow the results; based on the results we will consult CT surgery  Dysphagia -Swallow evaluation obtained - patient was initially started on dysphagia 3 diet -Has been n.p.o. due to altered mental  status  Altered mental status -Likely from above -We will continue to monitor  Sacral pressure ulcer stage IV -General surgery did debridement yesterday -CT pelvis showed sacral decubitus ulcer with regional cellulitis and soft tissue emphysema -No osteomyelitis or drainable fluid collection -Wound care following  Diabetes mellitus type 2 -Recent hemoglobin A1c was 7.8 -Continue Lantus, moderate sliding scale insulin with NovoLog -CBG has been low -We will start D5 half-normal saline at 75 mill per hour -Patient was on Lantus 28 units in the morning and 25 units in p.m. -We will cut down the dose of Lantus to 10 units subcu daily  History of pulmonary embolism -Eliquis was held yesterday due to thrombocytopenia -We will continue to monitor patient's platelet count -Consider restarting Eliquis if platelet count remains stable  Hypertension -Blood pressure is stable, though on lower side -We will hold Lopressor  Neurosarcoidosis -Patient is nonambulatory -Continue prednisone 40 mg twice daily -As patient is not able to take by mouth, will start stress dose with Solu-Cortef 100 mg IV every 8 hours -Palliative care has been consulted  Obstructive sleep apnea -Patient not on CPAP  Goals of care Discussed with patient's husband at bedside, patient is very sick considering MRSA bacteremia/severe sepsis, altered mental status.  She is DNR/DNI, not requiring pressors at this time.  She has been started on vancomycin and Zosyn as above.  If she does not turnaround next 24 to 48 hours, she has poor prognosis considering multiple medical problems as above.  Patient's husband understands her poor prognosis.  If no improvement in next 24 to 48 hours, consider comfort measures only.  Scheduled medications:    atorvastatin  80 mg Oral q1800   chlorhexidine  15 mL Mouth Rinse BID   collagenase  Topical BID   feeding supplement (GLUCERNA SHAKE)  237 mL Oral BID BM   ferrous sulfate  325  mg Oral Q breakfast   gabapentin  100 mg Oral TID   hydrocortisone sod succinate (SOLU-CORTEF) inj  100 mg Intravenous Q8H   insulin aspart  0-15 Units Subcutaneous TID WC   insulin aspart  0-5 Units Subcutaneous QHS   insulin glargine-yfgn  10 Units Subcutaneous QHS   mouth rinse  15 mL Mouth Rinse q12n4p   melatonin  10 mg Oral QHS   multivitamin with minerals  1 tablet Oral Daily   nystatin  5 mL Oral QID   pantoprazole  40 mg Oral Daily   silver nitrate applicators  1 application Topical Once   sodium chloride flush  3 mL Intravenous Q12H     Data Reviewed:   CBG:  Recent Labs  Lab 03/01/21 1723 03/01/21 2045 03/01/21 2224 03/02/21 0735 03/02/21 1125  GLUCAP 98 78 92 82 120*    SpO2: 90 % O2 Flow Rate (L/min): 3.5 L/min    Vitals:   03/02/21 0831 03/02/21 0900 03/02/21 1000 03/02/21 1130  BP:    122/88  Pulse:    100  Resp:    14  Temp: 99.5 F (37.5 C) 98.7 F (37.1 C) 98.8 F (37.1 C) 98.7 F (37.1 C)  TempSrc: Oral Oral Oral Oral  SpO2:    90%  Weight:      Height:         Intake/Output Summary (Last 24 hours) at 03/02/2021 1608 Last data filed at 03/02/2021 1357 Gross per 24 hour  Intake 849.83 ml  Output 700 ml  Net 149.83 ml    10/13 1901 - 10/15 0700 In: 1679.8 [P.O.:480; I.V.:795.9] Out: 500 [Urine:400]  Filed Weights   02/27/21 2353  Weight: 80.7 kg    Data Reviewed: Basic Metabolic Panel: Recent Labs  Lab 02/28/21 0018 03/01/21 0504 03/02/21 0311  NA 127* 131* 129*  K 2.5* 4.2 4.8  CL 88* 90* 94*  CO2 29 30 19*  GLUCOSE 372* 120* 98  BUN 6 11 17   CREATININE 0.50 0.52 0.78  CALCIUM 8.1* 9.1 8.6*  MG 1.7  --   --    Liver Function Tests: No results for input(s): AST, ALT, ALKPHOS, BILITOT, PROT, ALBUMIN in the last 168 hours. No results for input(s): LIPASE, AMYLASE in the last 168 hours. No results for input(s): AMMONIA in the last 168 hours. CBC: Recent Labs  Lab 02/28/21 0018 03/01/21 0504 03/02/21 0411  WBC  13.1* 13.5* 14.2*  NEUTROABS 12.3*  --   --   HGB 10.8* 12.2 11.3*  HCT 35.1* 39.6 36.6  MCV 92.9 90.2 90.8  PLT 56* 40* 30*   Cardiac Enzymes: No results for input(s): CKTOTAL, CKMB, CKMBINDEX, TROPONINI in the last 168 hours. BNP (last 3 results) Recent Labs    11/23/20 0235 11/24/20 0450 02/28/21 0018  BNP 160.8* 152.6* 440.8*    ProBNP (last 3 results) No results for input(s): PROBNP in the last 8760 hours.  CBG: Recent Labs  Lab 03/01/21 1723 03/01/21 2045 03/01/21 2224 03/02/21 0735 03/02/21 1125  GLUCAP 98 78 92 82 120*       Radiology Reports  No results found.     Antibiotics: Anti-infectives (From admission, onward)    Start     Dose/Rate Route Frequency Ordered Stop   03/02/21 1200  vancomycin (VANCOCIN) IVPB 1000 mg/200 mL premix        1,000 mg  200 mL/hr over 60 Minutes Intravenous Every 24 hours 03/01/21 1326     03/01/21 1230  vancomycin (VANCOREADY) IVPB 1750 mg/350 mL        1,750 mg 175 mL/hr over 120 Minutes Intravenous  Once 03/01/21 1123 03/01/21 1459   03/01/21 1200  piperacillin-tazobactam (ZOSYN) IVPB 3.375 g  Status:  Discontinued        3.375 g 12.5 mL/hr over 240 Minutes Intravenous Every 8 hours 03/01/21 1123 03/02/21 0021         DVT prophylaxis: SCDs  Code Status: DNR  Family Communication: No family at bedside   Consultants: General surgery  Procedures: Debridement of sacral pressure ulcer stage IV    Objective    Physical Examination:  General-appears lethargic Heart-S1-S2, regular, no murmur auscultated Lungs-clear to auscultation bilaterally, no wheezing or crackles auscultated Abdomen-soft, nontender, no organomegaly Extremities-no edema in the lower extremities Neuro-lethargic, responds to verbal stimuli   Status is: Inpatient  Dispo: The patient is from: Home              Anticipated d/c is to: To be decided              Anticipated d/c date is: 03/05/2021              Patient currently  not stable for discharge  Barrier to discharge-severe sepsis  COVID-19 Labs  Recent Labs    03/01/21 0504  FERRITIN 3,143*    Lab Results  Component Value Date   SARSCOV2NAA NEGATIVE 02/28/2021   [place] NEGATIVE 11/22/2020   [place] NEGATIVE 09/27/2020   [place] NEGATIVE 08/29/2020     Pressure Injury 02/28/21 Coccyx Mid;Lower Stage 4 - Full thickness tissue loss with exposed bone, tendon or muscle. pink wound, necrotic tissue present, drainage around sacral wound with moderate serosanguineous drainage. (Active)  02/28/21 1050  Location: Coccyx  Location Orientation: Mid;Lower  Staging: Stage 4 - Full thickness tissue loss with exposed bone, tendon or muscle.  Wound Description (Comments): pink wound, necrotic tissue present, drainage around sacral wound with moderate serosanguineous drainage.  Present on Admission: Yes        Recent Results (from the past 240 hour(s))  Resp Panel by RT-PCR (Flu A&B, Covid) Nasopharyngeal Swab     Status: None   Collection Time: 02/28/21  1:55 AM   Specimen: Nasopharyngeal Swab; Nasopharyngeal(NP) swabs in vial transport medium  Result Value Ref Range Status   SARS Coronavirus 2 by RT PCR NEGATIVE NEGATIVE Final    Comment: (NOTE) SARS-CoV-2 target nucleic acids are NOT DETECTED.  The SARS-CoV-2 RNA is generally detectable in upper respiratory specimens during the acute phase of infection. The lowest concentration of SARS-CoV-2 viral copies this assay can detect is 138 copies/mL. A negative result does not preclude SARS-Cov-2 infection and should not be used as the sole basis for treatment or other patient management decisions. A negative result may occur with  improper specimen collection/handling, submission of specimen other than nasopharyngeal swab, presence of viral mutation(s) within the areas targeted by this assay, and inadequate number of viral copies(<138 copies/mL). A negative result must be combined  with clinical observations, patient history, and epidemiological information. The expected result is Negative.  Fact Sheet for Patients:  EntrepreneurPulse.com.au  Fact Sheet for Healthcare Providers:  IncredibleEmployment.be  This test is no t yet approved or cleared by the [place] FDA and  has been authorized for detection and/or diagnosis of SARS-CoV-2 by FDA under an Emergency Use Authorization (EUA). This EUA will remain  in effect (meaning this test can be used) for the duration of the COVID-19 declaration under Section 564(b)(1) of the Act, 21 U.S.C.section 360bbb-3(b)(1), unless the authorization is terminated  or revoked sooner.       Influenza A by PCR NEGATIVE NEGATIVE Final   Influenza B by PCR NEGATIVE NEGATIVE Final    Comment: (NOTE) The Xpert Xpress SARS-CoV-2/FLU/RSV plus assay is intended as an aid in the diagnosis of influenza from Nasopharyngeal swab specimens and should not be used as a sole basis for treatment. Nasal washings and aspirates are unacceptable for Xpert Xpress SARS-CoV-2/FLU/RSV testing.  Fact Sheet for Patients: EntrepreneurPulse.com.au  Fact Sheet for Healthcare Providers: IncredibleEmployment.be  This test is not yet approved or cleared by the [place] FDA and has been authorized for detection and/or diagnosis of SARS-CoV-2 by FDA under an Emergency Use Authorization (EUA). This EUA will remain in effect (meaning this test can be used) for the duration of the COVID-19 declaration under Section 564(b)(1) of the Act, 21 U.S.C. section 360bbb-3(b)(1), unless the authorization is terminated or revoked.  Performed at [facility], [place] [address], [place], [place] [address]   Culture, blood (routine x 2)     Status: None (Preliminary result)   Collection Time: 03/01/21  9:45 AM   Specimen: BLOOD  Result Value Ref Range Status   Specimen Description  BLOOD RIGHT ANTECUBITAL  Final   Special Requests   Final    BOTTLES DRAWN AEROBIC ONLY Blood Culture adequate volume   Culture  Setup Time   Final    GRAM POSITIVE COCCI IN CLUSTERS AEROBIC BOTTLE ONLY CRITICAL VALUE NOTED.  VALUE IS CONSISTENT WITH PREVIOUSLY REPORTED AND CALLED VALUE.    Culture   Final    GRAM POSITIVE COCCI TOO YOUNG TO READ Performed at [facility], [place] [address], [place], [place] [address]    Report Status PENDING  Incomplete  Culture, blood (routine x 2)     Status: None (Preliminary result)   Collection Time: 03/01/21  9:59 AM   Specimen: BLOOD  Result Value Ref Range Status   Specimen Description BLOOD LEFT ANTECUBITAL  Final   Special Requests   Final    BOTTLES DRAWN AEROBIC AND ANAEROBIC Blood Culture adequate volume   Culture  Setup Time   Final    GRAM POSITIVE COCCI IN CLUSTERS IN BOTH AEROBIC AND ANAEROBIC BOTTLES CRITICAL RESULT CALLED TO, READ BACK BY AND VERIFIED WITH: V BRYK,[email or web address]@[email or web address]  03/02/21 [place]    Culture   Final    GRAM POSITIVE COCCI TOO YOUNG TO READ Performed at [facility], [place] [address], [place], [place] [address]    Report Status PENDING  Incomplete  Blood Culture ID Panel (Reflexed)     Status: Abnormal   Collection Time: 03/01/21  9:59 AM  Result Value Ref Range Status   Enterococcus faecalis NOT DETECTED NOT DETECTED Final   Enterococcus Faecium NOT DETECTED NOT DETECTED Final   Listeria monocytogenes NOT DETECTED NOT DETECTED Final   Staphylococcus species DETECTED (A) NOT DETECTED Final    Comment: CRITICAL RESULT CALLED TO, READ BACK BY AND VERIFIED WITH: V BRYK,[email or web address]  03/02/21 [place]    Staphylococcus aureus (BCID) DETECTED (A) NOT DETECTED Final    Comment: Methicillin (oxacillin)-resistant Staphylococcus aureus (MRSA). MRSA is predictably resistant to beta-lactam antibiotics (except ceftaroline). Preferred therapy is vancomycin unless clinically contraindicated. Patient requires contact precautions  if  hospitalized. CRITICAL RESULT CALLED TO, READ BACK BY AND VERIFIED WITH: V BRYK,[email or web address]   03/02/21 [place]    Staphylococcus epidermidis NOT DETECTED NOT DETECTED Final   Staphylococcus lugdunensis NOT DETECTED NOT DETECTED Final   Streptococcus species NOT DETECTED NOT DETECTED Final   Streptococcus agalactiae NOT DETECTED NOT DETECTED Final   Streptococcus pneumoniae NOT DETECTED NOT DETECTED Final   Streptococcus pyogenes NOT DETECTED NOT DETECTED Final   A.calcoaceticus-baumannii NOT DETECTED NOT DETECTED Final   Bacteroides fragilis NOT DETECTED NOT DETECTED Final   Enterobacterales NOT DETECTED NOT DETECTED Final   Enterobacter cloacae complex NOT DETECTED NOT DETECTED Final   Escherichia coli NOT DETECTED NOT DETECTED Final   Klebsiella aerogenes NOT DETECTED NOT DETECTED Final   Klebsiella oxytoca NOT DETECTED NOT DETECTED Final   Klebsiella pneumoniae NOT DETECTED NOT DETECTED Final   Proteus species NOT DETECTED NOT DETECTED Final   Salmonella species NOT DETECTED NOT DETECTED Final   Serratia marcescens NOT DETECTED NOT DETECTED Final   Haemophilus influenzae NOT DETECTED NOT DETECTED Final   Neisseria meningitidis NOT DETECTED NOT DETECTED Final   Pseudomonas aeruginosa NOT DETECTED NOT DETECTED Final   Stenotrophomonas maltophilia NOT DETECTED NOT DETECTED Final   Candida albicans NOT DETECTED NOT DETECTED Final   Candida auris NOT DETECTED NOT DETECTED Final   Candida glabrata NOT DETECTED NOT DETECTED Final   Candida krusei NOT DETECTED NOT DETECTED Final   Candida parapsilosis NOT DETECTED NOT DETECTED Final   Candida tropicalis NOT DETECTED NOT DETECTED Final   Cryptococcus neoformans/gattii NOT DETECTED NOT DETECTED Final   Meth resistant mecA/C and MREJ DETECTED (A) NOT DETECTED Final    Comment: CRITICAL RESULT CALLED TO, READ BACK BY AND VERIFIED WITH: V BRYK,[email or web address]  03/02/21 [place] Performed at [facility], [address] [place], [place] [address]      [facility] If 7PM-7AM, please contact night-coverage at [email or web address], Office  [phone]   03/02/2021, 4:08 PM  LOS: 2 days

## 2021-03-02 NOTE — Progress Notes (Signed)
[facility] Inpatient Follow Up Note  Consulting Provider: Jonah Blue, MD   Reason for consult:   [facility] Consult Services [facility] Consult  Reason for Consult? goals of care    HPI:  Per intake H&P --> Rhonda [name] is a 60 y.o. female with medical history significant of HTN; OSA not on CPAP; prediabetes; neurosarcoidosis; colostomy; and seizures presenting with SOB.  She has been having trouble breathing since last night and complaining of chest pain.  Symptoms started yesterday AM and progressed all day and night.  Appears to be epigastric when she points to it.  +cough, nonproductive.  She does throat clearing after drinking water.  No change in LE edema. No fever.  No sick contacts.  The pain is currently resolved.  She has a sacral pressure ulcer that her husband treats.   [facility]onda care has been asked to get involved to further address goals of care.  Today's Discussion (03/02/2021):  *Please note that this is a verbal dictation therefore any spelling or grammatical errors are due to the "[employer]agon [employer] One" system interpretation.  Chart reviewed.  I met at bedside with Rhonda [name] this morning.  She was more alert and arousable as compared to yesterday.  She was noted to have a bear hugger on.  She did not endorse any pain or difficulty with breathing.  I spoke to patient's husband Rhonda [name] this morning.  He expresses that Rhonda [name] is in an overall better state than she was yesterday.  He shares that it is hard to understand her situation if you are not caring for her consistently.  Rhonda [name] shares that he did get some of the information I had left at bedside yesterday.  He endorses that he would like to give her a few days to see if improvements can be made and depending on how these next few days go additional decisions amongst he and his family will be made.  Questions and concerns addressed /Rhonda support was provided.  Objective Assessment: Vital  Signs Vitals:   03/02/21 0831 03/02/21 0900  BP:    Pulse:    Resp:    Temp: 99.5 F (37.5 C) 98.7 F (37.1 C)  SpO2:      Intake/Output Summary (Last 24 hours) at 03/02/2021 1021 Last data filed at 03/02/2021 0630 Gross per 24 hour  Intake 1199.83 ml  Output 400 ml  Net 799.83 ml   Last Weight  Most recent update: 02/27/2021 11:54 PM    Weight  80.7 kg (178 lb)            Gen: Very ill-appearing older African-American female HEENT: Dry, Rhonda patches present on tongue CV: Regular rate and rhythm PULM: On room air ABD: Colostomy pouch in left lower quadrant, mid abdominal dressing in place EXT: Right foot pedal edema Neuro: Alert and oriented x2  SUMMARY OF RECOMMENDATIONS   DNAR/DNI  Ongoing daily conversations with patients spouse  Watchful waiting for improvements in the oncoming days, if these neglect to occur additional conversations will be held regarding next steps   Goals: Optimize medically as much as possible to transition back home   TOC - OP Rhonda support upon discharge   The Rhonda [name] will continue to follow along with Rhonda [name] during hospitalization   Time Spent:25 Greater than 50% of the time was spent in counseling and coordination of care ______________________________________________________________________________________ Rhonda [name] Rhonda [name] Rhonda [name] [name] Cell Phone: [phone] Please utilize secure chat with additional questions, if there  is no response within 30 minutes please call the above phone number  [facility]onda [name] providers are available by phone from 7am to 7pm daily and can be reached through the [name] cell phone.  Should this patient require assistance outside of these hours, please call the patient's attending physician.

## 2021-03-02 NOTE — Progress Notes (Signed)
PHARMACY - PHYSICIAN COMMUNICATION CRITICAL VALUE ALERT - BLOOD CULTURE IDENTIFICATION (BCID)  Rhonda [name] is an 60 y.o. female who presented to [facility] on [facility]27/2021 with a chief complaint of SOB/CP.  Assessment:  Started on broad-spectrum ABX for sepsis, now growing MRSA in 3 of 3 blood cx bottles.  Name of physician (or Provider) ContactedBrown Human MD  Current antibiotics: vancomycin and Zosyn  Changes to prescribed antibiotics recommended:  Will continue vancomycin and d/c Zosyn.  Results for orders placed or performed during the hospital encounter of 02/27/21  Blood Culture ID Panel (Reflexed) (Collected: 03/01/2021  9:59 AM)  Result Value Ref Range   Enterococcus faecalis NOT DETECTED NOT DETECTED   Enterococcus Faecium NOT DETECTED NOT DETECTED   Listeria monocytogenes NOT DETECTED NOT DETECTED   Staphylococcus species DETECTED (A) NOT DETECTED   Staphylococcus aureus (BCID) DETECTED (A) NOT DETECTED   Staphylococcus epidermidis NOT DETECTED NOT DETECTED   Staphylococcus lugdunensis NOT DETECTED NOT DETECTED   Streptococcus species NOT DETECTED NOT DETECTED   Streptococcus agalactiae NOT DETECTED NOT DETECTED   Streptococcus pneumoniae NOT DETECTED NOT DETECTED   Streptococcus pyogenes NOT DETECTED NOT DETECTED   A.calcoaceticus-baumannii NOT DETECTED NOT DETECTED   Bacteroides fragilis NOT DETECTED NOT DETECTED   Enterobacterales NOT DETECTED NOT DETECTED   Enterobacter cloacae complex NOT DETECTED NOT DETECTED   Escherichia coli NOT DETECTED NOT DETECTED   Klebsiella aerogenes NOT DETECTED NOT DETECTED   Klebsiella oxytoca NOT DETECTED NOT DETECTED   Klebsiella pneumoniae NOT DETECTED NOT DETECTED   Proteus species NOT DETECTED NOT DETECTED   Salmonella species NOT DETECTED NOT DETECTED   Serratia marcescens NOT DETECTED NOT DETECTED   Haemophilus influenzae NOT DETECTED NOT DETECTED   Neisseria meningitidis NOT DETECTED NOT DETECTED   Pseudomonas aeruginosa  NOT DETECTED NOT DETECTED   Stenotrophomonas maltophilia NOT DETECTED NOT DETECTED   Candida albicans NOT DETECTED NOT DETECTED   Candida auris NOT DETECTED NOT DETECTED   Candida glabrata NOT DETECTED NOT DETECTED   Candida krusei NOT DETECTED NOT DETECTED   Candida parapsilosis NOT DETECTED NOT DETECTED   Candida tropicalis NOT DETECTED NOT DETECTED   Cryptococcus neoformans/gattii NOT DETECTED NOT DETECTED   Meth resistant mecA/C and MREJ DETECTED (A) NOT DETECTED    Wynona Neat, PharmD, BCPS  03/02/2021  12:18 AM

## 2021-03-02 NOTE — Consult Note (Signed)
[facility]                                                                                        Patient Identification: Patient Name: Rhonda [name] MRN: [id number] [place] Date: 02/27/2021 11:45 PM Today's Date: 03/02/2021 Reason for consult: MRSA bacteremia Requesting provider: Tivis Ringer auto consultation  Principal Problem:   Shortness of breath Active Problems:   Essential hypertension   OSA (obstructive sleep apnea)   HLD (hyperlipidemia)   Neurosarcoidosis   Type 2 diabetes mellitus without complication, with long-term current use of insulin (HCC)   Unstageable pressure ulcer of sacral region ([place])   Hypokalemia   DNR (do not resuscitate)   Malnutrition of moderate degree   Antibiotics: Vancomycin 10/14-current                    Piperacillin tazobactam 10/14-current  Lines/Tubes: Colostomy  Assessment # MRSA bacteremia  # 7.5 cm anterior chest wall abscess with possible RT [place] joint septic arthritis  # Stage 4 Sacral pressure ulcer:  - s/p debridement 02/28/21 - Getting Hydrotherapy  - 10/2 CT pelvis NO osteomyelitis or drainable collection   # Abdominal wounds : pink granulation tissue in pictures  # LUQ colostomy  # Neurosarcoidosis  Recommendations  - Continue Vancomcyin, pharmacy to dose - Will change Zosyn to Unasyn for sacral ulcer coverage. Anticipate will need a short course of GN and anaerobic coverage like 2 weeks  - MRI chest for better evaluation of the chest wall abscess seen in CTAPE .  May need CT surgery consult pending results of MRI - Repeat 2 sets of blood cultures tomorrow ( ordered ) - TTE. Will need TEE if TTE negative - Monitor CBC, BMP and Vancomycin trough - Continue GOC discussion with family   Rest of the management as per the primary team. Please call with questions or concerns.  Thank you for the consult  Rosiland Oz, MD Infectious  Disease Physician [facility] [address] [place], [place] [address] Phone: [phone]  Fax: [phone]  __________________________________________________________________________________________________________ HPI and Hospital Course: 60 year old female with PMH of hypertension, OSA, prediabetes, neurosarcoidosis, colostomy, seizures, sacral pressure ulcer who presented to the ED on 10/13 with complaint of chest pain associated with nonproductive cough.  Of note she was hospitalized in 09/2020 with a perforated sigmoid colon leading to emergency ex lap and a Hartman's procedure.  She had delayed wound healing with wound VAC placement and the abdominal wound is slowly improving over time. History unobtainable from the patient and is taken from chart review.   At ED, patient is afebrile, WBC 13.1 Labs remarkable for sodium 127, potassium 2.5, glucose 372, BNP 440 Imaging as below Blood cx 10/14 GPC in 3/4 bottles, BCID as MRSA  ROS: unavailable at this time due to patient's clinical state   Past Medical History:  Diagnosis Date   Arthritis    Colostomy in place [facility])    ?ischemic bowel   Degenerative disc disease, lumbar    DNR (do not resuscitate) 02/28/2021   GERD (gastroesophageal reflux disease)  Hypertension    OSA (obstructive sleep apnea)    not currently on CPAP   Prediabetes    Sarcoidosis of central nervous system    Seizures (HCC)    Type 2    Urinary incontinence    Past Surgical History:  Procedure Laterality Date   ABDOMINAL HYSTERECTOMY     BOWEL RESECTION N/A 09/01/2020   Procedure: SMALL BOWEL RESECTION;  Surgeon: Georganna Skeans, MD;  Location: [place];  Service: General;  Laterality: N/A;   BREAST EXCISIONAL BIOPSY Right    BREAST SURGERY     right breast   CESAREAN SECTION     COLONOSCOPY WITH PROPOFOL N/A 07/26/2020   Procedure: COLONOSCOPY WITH PROPOFOL;  Surgeon: Carol Ada, MD;  Location: [place];  Service: Endoscopy;  Laterality: N/A;   ESOPHAGOGASTRODUODENOSCOPY (EGD) WITH PROPOFOL N/A 07/24/2020   Procedure: ESOPHAGOGASTRODUODENOSCOPY (EGD) WITH PROPOFOL;  Surgeon: Carol Ada, MD;  Location: [place];  Service: Endoscopy;  Laterality: N/A;  Bleeding with anemia-5 gms drop in hemoglobin   GIVENS CAPSULE STUDY N/A 08/21/2020   Procedure: GIVENS CAPSULE STUDY;  Surgeon: Juanita Craver, MD;  Location: [facility] ENDOSCOPY;  Service: Endoscopy;  Laterality: N/A;   IR ANGIOGRAM FOLLOW UP STUDY  08/29/2020   IR ANGIOGRAM SELECTIVE EACH ADDITIONAL VESSEL  08/29/2020   IR ANGIOGRAM VISCERAL SELECTIVE  08/29/2020   IR ANGIOGRAM VISCERAL SELECTIVE  08/29/2020   IR EMBO ART  VEN HEMORR LYMPH EXTRAV  INC GUIDE ROADMAPPING  08/29/2020   IR US GUIDE VASC ACCESS RIGHT  08/29/2020   LAPAROTOMY N/A 09/01/2020   Procedure: EXPLORATORY LAPAROTOMY;  Surgeon: Georganna Skeans, MD;  Location: [place];  Service: General;  Laterality: N/A;    Scheduled Meds:  atorvastatin  80 mg Oral q1800   chlorhexidine  15 mL Mouth Rinse BID   collagenase   Topical BID   feeding supplement (GLUCERNA SHAKE)  237 mL Oral BID BM   ferrous sulfate  325 mg Oral Q breakfast   gabapentin  100 mg Oral TID   insulin aspart  0-15 Units Subcutaneous TID WC   insulin aspart  0-5 Units Subcutaneous QHS   insulin glargine-yfgn  25 Units Subcutaneous QHS   mouth rinse  15 mL Mouth Rinse q12n4p   melatonin  10 mg Oral QHS   multivitamin with minerals  1 tablet Oral Daily   nystatin  5 mL Oral QID   pantoprazole  40 mg Oral Daily   predniSONE  40 mg Oral BID WC   silver nitrate applicators  1 application Topical Once   sodium chloride flush  3 mL Intravenous Q12H   Continuous Infusions:  dextrose 5 % and 0.45% NaCl 75 mL/hr at 03/02/21 0630   vancomycin     PRN Meds:.acetaminophen **OR** acetaminophen, albuterol, hydrALAZINE, HYDROcodone-acetaminophen, morphine injection, ondansetron **OR** ondansetron (ZOFRAN) IV, polyvinyl  alcohol  Allergies  Allergen Reactions   Aspirin Swelling   Augmentin [Amoxicillin-Pot Clavulanate] Swelling and Other (See Comments)    **patient has tolerated piperacillin/tazobactam, cephalexin, and ceftriaxone Hand swelled at IV site - likely extravasation    Social History   Socioeconomic History   Marital status: Married    Spouse name: Not on file   Number of children: 4   Years of education: 12   Highest education level: Not on file  Occupational History   Occupation: disabled  Tobacco Use   Smoking status: Never   Smokeless tobacco: Never  Vaping Use   Vaping Use: Never used  Substance and Sexual Activity  Alcohol use: No   Drug use: No   Sexual activity: Yes    Birth control/protection: None  Other Topics Concern   Not on file  Social History Narrative   Right handed   Tea sometimes, hot chocolate    Social Determinants of Health   Financial Resource Strain: Not on file  Food Insecurity: Not on file  Transportation Needs: Not on file  Physical Activity: Not on file  Stress: Not on file  Social Connections: Not on file  Intimate Partner Violence: Not on file   Family History  Problem Relation Age of Onset   Cancer Mother    Hypertension Sister    Diabetes Sister    Diabetes Maternal Grandmother     Vitals BP 93/76 (BP Location: Left Wrist)   Pulse (!) 116   Temp 98.6 F (37 C) (Oral)   Resp 14   Ht 5\' 3"  (1.6 m)   Wt 80.7 kg   SpO2 99%   BMI 31.53 kg/m    Physical Exam Constitutional:  Lying in bed,closed eyes, does not respond to verbal stimuli     Comments:   Cardiovascular:     Rate and Rhythm: Normal rate and regular rhythm.     Heart sounds:   Pulmonary:     Effort: On Nasal cannula    Comments:   Abdominal:     Palpations: Abdomen is soft.     Tenderness: LUQ colostomy,mid abdomen has a large bandage ( pictures of the abdominal wound reviewed )  Musculoskeletal:        General: No swelling or tenderness. In  peripheral joints, Back not examined   Skin:    Comments:              Neurological:     General: unable to assess  Psychiatric:        Mood and Affect:unable to assess  Pertinent Microbiology Results for orders placed or performed during the hospital encounter of 02/27/21  Resp Panel by RT-PCR (Flu A&B, Covid) Nasopharyngeal Swab     Status: None   Collection Time: 02/28/21  1:55 AM   Specimen: Nasopharyngeal Swab; Nasopharyngeal(NP) swabs in vial transport medium  Result Value Ref Range Status   SARS Coronavirus 2 by RT PCR NEGATIVE NEGATIVE Final    Comment: (NOTE) SARS-CoV-2 target nucleic acids are NOT DETECTED.  The SARS-CoV-2 RNA is generally detectable in upper respiratory specimens during the acute phase of infection. The lowest concentration of SARS-CoV-2 viral copies this assay can detect is 138 copies/mL. A negative result does not preclude SARS-Cov-2 infection and should not be used as the sole basis for treatment or other patient management decisions. A negative result may occur with  improper specimen collection/handling, submission of specimen other than nasopharyngeal swab, presence of viral mutation(s) within the areas targeted by this assay, and inadequate number of viral copies(<138 copies/mL). A negative result must be combined with clinical observations, patient history, and epidemiological information. The expected result is Negative.  Fact Sheet for Patients:  EntrepreneurPulse.com.au  Fact Sheet for Healthcare Providers:  IncredibleEmployment.be  This test is no t yet approved or cleared by the [place] FDA and  has been authorized for detection and/or diagnosis of SARS-CoV-2 by FDA under an Emergency Use Authorization (EUA). This EUA will remain  in effect (meaning this test can be used) for the duration of the COVID-19 declaration under Section 564(b)(1) of the Act, 21 U.S.C.section  360bbb-3(b)(1), unless the authorization is terminated  or revoked  sooner.       Influenza A by PCR NEGATIVE NEGATIVE Final   Influenza B by PCR NEGATIVE NEGATIVE Final    Comment: (NOTE) The Xpert Xpress SARS-CoV-2/FLU/RSV plus assay is intended as an aid in the diagnosis of influenza from Nasopharyngeal swab specimens and should not be used as a sole basis for treatment. Nasal washings and aspirates are unacceptable for Xpert Xpress SARS-CoV-2/FLU/RSV testing.  Fact Sheet for Patients: EntrepreneurPulse.com.au  Fact Sheet for Healthcare Providers: IncredibleEmployment.be  This test is not yet approved or cleared by the [place] FDA and has been authorized for detection and/or diagnosis of SARS-CoV-2 by FDA under an Emergency Use Authorization (EUA). This EUA will remain in effect (meaning this test can be used) for the duration of the COVID-19 declaration under Section 564(b)(1) of the Act, 21 U.S.C. section 360bbb-3(b)(1), unless the authorization is terminated or revoked.  Performed at [facility], [place] [address] [place], [place] [address]   Culture, blood (routine x 2)     Status: None (Preliminary result)   Collection Time: 03/01/21  9:45 AM   Specimen: BLOOD  Result Value Ref Range Status   Specimen Description BLOOD RIGHT ANTECUBITAL  Final   Special Requests   Final    BOTTLES DRAWN AEROBIC ONLY Blood Culture adequate volume   Culture  Setup Time   Final    GRAM POSITIVE COCCI IN CLUSTERS AEROBIC BOTTLE ONLY CRITICAL VALUE NOTED.  VALUE IS CONSISTENT WITH PREVIOUSLY REPORTED AND CALLED VALUE. Performed at [facility], [place] [address] [place], [place] [address]    Culture PENDING  Incomplete   Report Status PENDING  Incomplete  Culture, blood (routine x 2)     Status: None (Preliminary result)   Collection Time: 03/01/21  9:59 AM   Specimen: BLOOD  Result Value Ref Range Status   Specimen Description  BLOOD LEFT ANTECUBITAL  Final   Special Requests   Final    BOTTLES DRAWN AEROBIC AND ANAEROBIC Blood Culture adequate volume   Culture  Setup Time   Final    GRAM POSITIVE COCCI IN CLUSTERS IN BOTH AEROBIC AND ANAEROBIC BOTTLES CRITICAL RESULT CALLED TO, READ BACK BY AND VERIFIED WITH: V BRYK,PHARMD@[email or web address]  03/02/21 [place] Performed at [facility], [place] [address], [place], [place] [address]    Culture PENDING  Incomplete   Report Status PENDING  Incomplete  Blood Culture ID Panel (Reflexed)     Status: Abnormal   Collection Time: 03/01/21  9:59 AM  Result Value Ref Range Status   Enterococcus faecalis NOT DETECTED NOT DETECTED Final   Enterococcus Faecium NOT DETECTED NOT DETECTED Final   Listeria monocytogenes NOT DETECTED NOT DETECTED Final   Staphylococcus species DETECTED (A) NOT DETECTED Final    Comment: CRITICAL RESULT CALLED TO, READ BACK BY AND VERIFIED WITH: V BRYK,PHARMD@[email or web address]  03/02/21 [place]    Staphylococcus aureus (BCID) DETECTED (A) NOT DETECTED Final    Comment: Methicillin (oxacillin)-resistant Staphylococcus aureus (MRSA). MRSA is predictably resistant to beta-lactam antibiotics (except ceftaroline). Preferred therapy is vancomycin unless clinically contraindicated. Patient requires contact precautions if  hospitalized. CRITICAL RESULT CALLED TO, READ BACK BY AND VERIFIED WITH: V BRYK,[email or web address]  03/02/21 [place]    Staphylococcus epidermidis NOT DETECTED NOT DETECTED Final   Staphylococcus lugdunensis NOT DETECTED NOT DETECTED Final   Streptococcus species NOT DETECTED NOT DETECTED Final   Streptococcus agalactiae NOT DETECTED NOT DETECTED Final   Streptococcus pneumoniae NOT DETECTED NOT DETECTED Final   Streptococcus pyogenes NOT DETECTED NOT  DETECTED Final   A.calcoaceticus-baumannii NOT DETECTED NOT DETECTED Final   Bacteroides fragilis NOT DETECTED NOT DETECTED Final   Enterobacterales NOT DETECTED NOT DETECTED Final   Enterobacter cloacae complex NOT DETECTED NOT  DETECTED Final   Escherichia coli NOT DETECTED NOT DETECTED Final   Klebsiella aerogenes NOT DETECTED NOT DETECTED Final   Klebsiella oxytoca NOT DETECTED NOT DETECTED Final   Klebsiella pneumoniae NOT DETECTED NOT DETECTED Final   Proteus species NOT DETECTED NOT DETECTED Final   Salmonella species NOT DETECTED NOT DETECTED Final   Serratia marcescens NOT DETECTED NOT DETECTED Final   Haemophilus influenzae NOT DETECTED NOT DETECTED Final   Neisseria meningitidis NOT DETECTED NOT DETECTED Final   Pseudomonas aeruginosa NOT DETECTED NOT DETECTED Final   Stenotrophomonas maltophilia NOT DETECTED NOT DETECTED Final   Candida albicans NOT DETECTED NOT DETECTED Final   Candida auris NOT DETECTED NOT DETECTED Final   Candida glabrata NOT DETECTED NOT DETECTED Final   Candida krusei NOT DETECTED NOT DETECTED Final   Candida parapsilosis NOT DETECTED NOT DETECTED Final   Candida tropicalis NOT DETECTED NOT DETECTED Final   Cryptococcus neoformans/gattii NOT DETECTED NOT DETECTED Final   Meth resistant mecA/C and MREJ DETECTED (A) NOT DETECTED Final    Comment: CRITICAL RESULT CALLED TO, READ BACK BY AND VERIFIED WITH: V BRYK,PHARMD@[email or web address]  03/02/21 [place] Performed at [facility], [address] [place], [place] [address]       Pertinent Lab seen by me: CBC Latest Ref Rng & Units 03/02/2021 03/01/2021 02/28/2021  WBC 4.0 - 10.5 K/uL 14.2(H) 13.5(H) 13.1(H)  Hemoglobin 12.0 - 15.0 g/dL 11.3(L) 12.2 10.8(L)  Hematocrit 36.0 - 46.0 % 36.6 39.6 35.1(L)  Platelets 150 - 400 K/uL 30(L) 40(L) 56(L)   CMP Latest Ref Rng & Units 03/02/2021 03/01/2021 02/28/2021  Glucose 70 - 99 mg/dL 98 120(H) 372(H)  BUN 6 - 20 mg/dL 17 11 6   Creatinine 0.44 - 1.00 mg/dL 0.78 0.52 0.50  Sodium 135 - 145 mmol/L 129(L) 131(L) 127(L)  Potassium 3.5 - 5.1 mmol/L 4.8 4.2 2.5(LL)  Chloride 98 - 111 mmol/L 94(L) 90(L) 88(L)  CO2 22 - 32 mmol/L 19(L) 30 29  Calcium 8.9 - 10.3 mg/dL 8.6(L) 9.1 8.1(L)  Total  Protein 6.5 - 8.1 g/dL - - -  Total Bilirubin 0.3 - 1.2 mg/dL - - -  Alkaline Phos 38 - 126 U/L - - -  AST 15 - 41 U/L - - -  ALT 0 - 44 U/L - - -     Pertinent Imagings/Other Imagings Plain films and CT images have been personally visualized and interpreted; radiology reports have been reviewed. Decision making incorporated into the Impression / Recommendations. Chest Xray 02/28/21 FINDINGS: Shallow inspiration. Mild cardiac enlargement. No vascular congestion, edema, or consolidation. Prominence of the mediastinum is unchanged since prior study and correlates to a combination of prominent mediastinal fat and lymphadenopathy. No airspace disease or consolidation in the lungs.   IMPRESSION: Shallow inspiration. Cardiac enlargement. Prominent mediastinal structures correspond to mediastinal fat and lymphadenopathy on previous CT. No active pulmonary disease.  CTAPE 02/28/21 IMPRESSION: 1. Small bandlike filling defect in the proximal right upper lobe pulmonary artery in an area of a prior pulmonary embolus consistent with a fibrotic band. No findings suspicious for acute PE. 2. Stable tortuosity and mild ectasia of the thoracic aorta but no aneurysm or dissection. 3. Chronic appearing patchy mosaic pattern of ground-glass attenuation could be asymmetric pulmonary edema, small airways disease such as asthma or  respiratory bronchiolitis. Other possibilities would include cryptogenic organizing pneumonia or hypersensitivity pneumonitis. 4. Small bilateral pleural effusions and overlying atelectasis. 5. 7.5 cm anterior chest wall abscess of uncertain origin. Right sternoclavicular joint septic arthritis would be a possibility although I do not see any definite CT evidence of this. MRI may be helpful for further evaluation.  I spent more than 70  minutes for this patient encounter including review of prior medical records/discussing diagnostics and treatment plan with the  patient/family/coordinate care with primary/other specialits with greater than 50% of time in face to face encounter.   Electronically signed by:   Rosiland Oz, MD Infectious Disease Physician [facility] for Infectious Disease Pager: [phone]

## 2021-03-03 ENCOUNTER — Inpatient Hospital Stay (HOSPITAL_COMMUNITY): Payer: 59

## 2021-03-03 DIAGNOSIS — R7881 Bacteremia: Secondary | ICD-10-CM | POA: Diagnosis not present

## 2021-03-03 DIAGNOSIS — A4101 Sepsis due to Methicillin susceptible Staphylococcus aureus: Secondary | ICD-10-CM | POA: Diagnosis not present

## 2021-03-03 DIAGNOSIS — Z7189 Other specified counseling: Secondary | ICD-10-CM | POA: Diagnosis not present

## 2021-03-03 DIAGNOSIS — E440 Moderate protein-calorie malnutrition: Secondary | ICD-10-CM | POA: Diagnosis not present

## 2021-03-03 DIAGNOSIS — I10 Essential (primary) hypertension: Secondary | ICD-10-CM | POA: Diagnosis not present

## 2021-03-03 LAB — GLUCOSE, CAPILLARY
Glucose-Capillary: 162 mg/dL — ABNORMAL HIGH (ref 70–99)
Glucose-Capillary: 142 mg/dL — ABNORMAL HIGH (ref 70–99)
Glucose-Capillary: 145 mg/dL — ABNORMAL HIGH (ref 70–99)
Glucose-Capillary: 125 mg/dL — ABNORMAL HIGH (ref 70–99)

## 2021-03-03 LAB — VITAMIN B1: Vitamin B1 (Thiamine): 152.7 nmol/L (ref 66.5–200.0)

## 2021-03-03 LAB — ECHOCARDIOGRAM COMPLETE
AR max vel: 1.92 cm2
AV Area VTI: 1.8 cm2
AV Area mean vel: 1.81 cm2
AV Mean grad: 5.7 mmHg
AV Peak grad: 12 mmHg
Ao pk vel: 1.74 m/s
Area-P 1/2: 4.44 cm2
Height: 63 in
MV VTI: 1.8 cm2
S' Lateral: 2.5 cm
Weight: 2848 oz

## 2021-03-03 MED ORDER — GADOBUTROL 1 MMOL/ML IV SOLN
7.5000 mL | Freq: Once | INTRAVENOUS | Status: AC | PRN
  Administered 2021-03-03: 7.5 mL via INTRAVENOUS

## 2021-03-03 NOTE — Progress Notes (Signed)
     [address]       [place] [address]             [phone]       Full consult note to follow. Chart and images reviewed, and case discussed with primary attending.  This is a 60 year old female that was brought to the hospital on February 28, 2021 with MRSA bacteremia.  She previously underwent a Hartman's procedure for perforated bowel and developed stage IV decubitus ulcer.  CT scan on October 13 shows that the patient also has a chest wall abscess by her right sternoclavicular joint as well as a cervical abscess.  She is chronically malnourished, deconditioned and on steroids thus surgical debridement would be very morbid with low likelihood of success.  I would recommend an IR guided drain placement for the sternoclavicular joint abscess, and ENT consultation for recommendations on the cervical abscess.  No plans for debridement by cardiothoracic surgery at this point.  Will place completed note after the patient has been seen.  Takeila Thayne Bary Leriche

## 2021-03-03 NOTE — Progress Notes (Signed)
Pt was having some St elevation BP 122/93, HR 100-105, P 93 O2 100 2LNC V.Rathore MD was paged by [facility] no new order give will cont to monitor.

## 2021-03-03 NOTE — Progress Notes (Signed)
*  PRELIMINARY RESULTS* Echocardiogram 2D Echocardiogram has been performed.  Rhonda [name] 03/03/2021, 10:20 AM

## 2021-03-03 NOTE — Plan of Care (Signed)
  Problem: Clinical Measurements: Goal: Ability to maintain clinical measurements within normal limits will improve Outcome: Progressing   Problem: Pain Managment: Goal: General experience of comfort will improve Outcome: Progressing   Problem: Safety: Goal: Ability to remain free from injury will improve Outcome: Progressing   

## 2021-03-03 NOTE — Progress Notes (Signed)
[facility]  PROGRESS NOTE  [name] QQI:[id number] DOB: 07-20-1960 DOA: 02/27/2021 PCP: Rhonda [name] HPI:   60 year old female with medical history of hypertension, OSA not on CPAP, prediabetes, neurosarcoidosis, colostomy, seizures presents with shortness of breath. She was hospitalized in 5/22 with perforated sigmoid colon leading to emergency exploratory laparotomy with Hartman's procedure, she has delayed wound healing with wound VAC placement abdominal wound is slowly improving over time. Patient has history of neurosarcoid, debilitated, bedbound, has large sacral wound.  General surgery was consulted for debridement of sacral ulcer stage IV    Subjective   Patient seen and examined, she is more alert today.  Earlier MRI was ordered for CT chest without contrast which was pending as patient had abdominal embolization coils placed 4/22 and respiratory rate was unsafe to perform MRI chest due to implanted coils.  However later MRI tech confirmed that they can do an MRI chest to rule out chest wall abscess.   Assessment/Plan:    Severe sepsis/MRSA bacteremia -Patient presented with shortness of breath -Was seen by general surgery and underwent debridement of stage IV sacral pressure ulcer -On 03/01/2021, patient developed altered mental status -Sepsis work-up was initiated, lactic acid initially was 1.6, repeat lactic acid was 6.0 -Blood and urine cultures were obtained.  MRSA growing in all 4 bottles from blood cultures -Patient was empirically started on vancomycin and Zosyn on 03/01/2021 -Antibiotics have been changed to vancomycin and Unasyn per pharmacy -Source of infection likely decubitus ulcer versus questionable chest wall abscess, sternoclavicular joint septic arthritis -As patient blood pressure is stable, no need of 30 cc/kg IV fluids to avoid fluid overload  ?  Chest wall abscess -CTA chest showed 7.5 cm anterior chest wall abscess of  uncertain origin -Questionable sternoclavicular joint septic arthritis -MRI chest without contrast ordered by ID -We will follow the results; based on the results we will consult CT surgery  Dysphagia -Swallow evaluation obtained - patient was initially started on dysphagia 3 diet -Has been n.p.o. due to altered mental status -Started on D5 half-normal saline at 75 mill per hour  Altered mental status -Likely from above -We will continue to monitor  Sacral pressure ulcer stage IV -General surgery did debridement yesterday -CT pelvis showed sacral decubitus ulcer with regional cellulitis and soft tissue emphysema -No osteomyelitis or drainable fluid collection -Wound care following  Diabetes mellitus type 2 -Recent hemoglobin A1c was 7.8 -Continue Lantus, moderate sliding scale insulin with NovoLog -CBG has been low -We will start D5 half-normal saline at 75 mill per hour -Patient was on Lantus 28 units in the morning and 25 units in p.m. -Dose of Lantus was cut down to 10 units subcu daily   History of pulmonary embolism -Eliquis was held yesterday due to thrombocytopenia -We will continue to monitor patient's platelet count -Consider restarting Eliquis if platelet count remains stable  Hypertension -Blood pressure is stable, though on lower side -Lopressor on hold  Neurosarcoidosis -Patient is nonambulatory -Patient was started on prednisone 40 mg twice daily -As patient is not able to take by mouth, will start stress dose with Solu-Cortef 100 mg IV every 8 hours -Palliative care has been consulted  Obstructive sleep apnea -Patient not on CPAP  Goals of care Discussed with patient's husband at bedside, patient is very sick considering MRSA bacteremia/severe sepsis, altered mental status.  She is DNR/DNI, not requiring pressors at this time.  She has been started on vancomycin and Zosyn as above.  If she does not turnaround next 24 to 48 hours, she has poor prognosis  considering multiple medical problems as above.  Patient's husband understands her poor prognosis.  If no improvement in next 24 to 48 hours, consider comfort measures only.  Scheduled medications:    atorvastatin  80 mg Oral q1800   chlorhexidine  15 mL Mouth Rinse BID   collagenase   Topical BID   feeding supplement (GLUCERNA SHAKE)  237 mL Oral BID BM   ferrous sulfate  325 mg Oral Q breakfast   gabapentin  100 mg Oral TID   hydrocortisone sod succinate (SOLU-CORTEF) inj  100 mg Intravenous Q8H   insulin aspart  0-15 Units Subcutaneous TID WC   insulin aspart  0-5 Units Subcutaneous QHS   insulin glargine-yfgn  10 Units Subcutaneous QHS   mouth rinse  15 mL Mouth Rinse q12n4p   melatonin  10 mg Oral QHS   multivitamin with minerals  1 tablet Oral Daily   nystatin  5 mL Oral QID   pantoprazole  40 mg Oral Daily   silver nitrate applicators  1 application Topical Once   sodium chloride flush  3 mL Intravenous Q12H     Data Reviewed:   CBG:  Recent Labs  Lab 03/02/21 0735 03/02/21 1125 03/02/21 1618 03/02/21 2244 03/03/21 0738  GLUCAP 82 120* 148* 152* 162*    SpO2: 100 % O2 Flow Rate (L/min): 2 L/min    Vitals:   03/02/21 0900 03/02/21 1000 03/02/21 1130 03/02/21 2016  BP:   122/88 132/70  Pulse:   100 92  Resp:   14 15  Temp: 98.7 F (37.1 C) 98.8 F (37.1 C) 98.7 F (37.1 C) 97.7 F (36.5 C)  TempSrc: Oral Oral Oral Axillary  SpO2:   90% 100%  Weight:      Height:         Intake/Output Summary (Last 24 hours) at 03/03/2021 1129 Last data filed at 03/03/2021 0320 Gross per 24 hour  Intake 1940.9 ml  Output 800 ml  Net 1140.9 ml    10/14 1901 - 10/16 0700 In: 2790.7 [I.V.:2336.8] Out: 1200 [Urine:1200]  Filed Weights   02/27/21 2353  Weight: 80.7 kg    Data Reviewed: Basic Metabolic Panel: Recent Labs  Lab 02/28/21 0018 03/01/21 0504 03/02/21 0311  NA 127* 131* 129*  K 2.5* 4.2 4.8  CL 88* 90* 94*  CO2 29 30 19*  GLUCOSE 372* 120*  98  BUN 6 11 17   CREATININE 0.50 0.52 0.78  CALCIUM 8.1* 9.1 8.6*  MG 1.7  --   --    Liver Function Tests: No results for input(s): AST, ALT, ALKPHOS, BILITOT, PROT, ALBUMIN in the last 168 hours. No results for input(s): LIPASE, AMYLASE in the last 168 hours. No results for input(s): AMMONIA in the last 168 hours. CBC: Recent Labs  Lab 02/28/21 0018 03/01/21 0504 03/02/21 0411  WBC 13.1* 13.5* 14.2*  NEUTROABS 12.3*  --   --   HGB 10.8* 12.2 11.3*  HCT 35.1* 39.6 36.6  MCV 92.9 90.2 90.8  PLT 56* 40* 30*   Cardiac Enzymes: No results for input(s): CKTOTAL, CKMB, CKMBINDEX, TROPONINI in the last 168 hours. BNP (last 3 results) Recent Labs    11/23/20 0235 11/24/20 0450 02/28/21 0018  BNP 160.8* 152.6* 440.8*    ProBNP (last 3 results) No results for input(s): PROBNP in the last 8760 hours.  CBG: Recent Labs  Lab 03/02/21 0735 03/02/21 1125 03/02/21 1618  03/02/21 2244 03/03/21 0738  GLUCAP 82 120* 148* 152* 162*       Radiology Reports  No results found.     Antibiotics: Anti-infectives (From admission, onward)    Start     Dose/Rate Route Frequency Ordered Stop   03/02/21 1800  Ampicillin-Sulbactam (UNASYN) 3 g in sodium chloride 0.9 % 100 mL IVPB        3 g 200 mL/hr over 30 Minutes Intravenous Every 6 hours 03/02/21 1652     03/02/21 1200  vancomycin (VANCOCIN) IVPB 1000 mg/200 mL premix        1,000 mg 200 mL/hr over 60 Minutes Intravenous Every 24 hours 03/01/21 1326     03/01/21 1230  vancomycin (VANCOREADY) IVPB 1750 mg/350 mL        1,750 mg 175 mL/hr over 120 Minutes Intravenous  Once 03/01/21 1123 03/01/21 1459   03/01/21 1200  piperacillin-tazobactam (ZOSYN) IVPB 3.375 g  Status:  Discontinued        3.375 g 12.5 mL/hr over 240 Minutes Intravenous Every 8 hours 03/01/21 1123 03/02/21 0021         DVT prophylaxis: SCDs  Code Status: DNR  Family Communication: No family at bedside   Consultants: General  surgery  Procedures: Debridement of sacral pressure ulcer stage IV    Objective    Physical Examination:  General-appears in no acute distress Heart-S1-S2, regular, no murmur auscultated Lungs-clear to auscultation bilaterally, no wheezing or crackles auscultated Abdomen-soft, nontender, no organomegaly Extremities-no edema in the lower extremities Neuro-alert, responding to questions, mental status is improved  Status is: Inpatient  Dispo: The patient is from: Home              Anticipated d/c is to: To be decided              Anticipated d/c date is: 03/05/2021              Patient currently not stable for discharge  Barrier to discharge-severe sepsis  COVID-19 Labs  Recent Labs    03/01/21 0504  FERRITIN 3,143*    Lab Results  Component Value Date   SARSCOV2NAA NEGATIVE 02/28/2021   [place] NEGATIVE 11/22/2020   [place] NEGATIVE 09/27/2020   [place] NEGATIVE 08/29/2020     Pressure Injury 02/28/21 Coccyx Mid;Lower Stage 4 - Full thickness tissue loss with exposed bone, tendon or muscle. pink wound, necrotic tissue present, drainage around sacral wound with moderate serosanguineous drainage. (Active)  02/28/21 1050  Location: Coccyx  Location Orientation: Mid;Lower  Staging: Stage 4 - Full thickness tissue loss with exposed bone, tendon or muscle.  Wound Description (Comments): pink wound, necrotic tissue present, drainage around sacral wound with moderate serosanguineous drainage.  Present on Admission: Yes        Recent Results (from the past 240 hour(s))  Resp Panel by RT-PCR (Flu A&B, Covid) Nasopharyngeal Swab     Status: None   Collection Time: 02/28/21  1:55 AM   Specimen: Nasopharyngeal Swab; Nasopharyngeal(NP) swabs in vial transport medium  Result Value Ref Range Status   SARS Coronavirus 2 by RT PCR NEGATIVE NEGATIVE Final    Comment: (NOTE) SARS-CoV-2 target nucleic acids are NOT DETECTED.  The SARS-CoV-2 RNA is generally  detectable in upper respiratory specimens during the acute phase of infection. The lowest concentration of SARS-CoV-2 viral copies this assay can detect is 138 copies/mL. A negative result does not preclude SARS-Cov-2 infection and should not be used as the sole basis for treatment or other  patient management decisions. A negative result may occur with  improper specimen collection/handling, submission of specimen other than nasopharyngeal swab, presence of viral mutation(s) within the areas targeted by this assay, and inadequate number of viral copies(<138 copies/mL). A negative result must be combined with clinical observations, patient history, and epidemiological information. The expected result is Negative.  Fact Sheet for Patients:  EntrepreneurPulse.com.au  Fact Sheet for Healthcare Providers:  IncredibleEmployment.be  This test is no t yet approved or cleared by the [place] FDA and  has been authorized for detection and/or diagnosis of SARS-CoV-2 by FDA under an Emergency Use Authorization (EUA). This EUA will remain  in effect (meaning this test can be used) for the duration of the COVID-19 declaration under Section 564(b)(1) of the Act, 21 U.S.C.section 360bbb-3(b)(1), unless the authorization is terminated  or revoked sooner.       Influenza A by PCR NEGATIVE NEGATIVE Final   Influenza B by PCR NEGATIVE NEGATIVE Final    Comment: (NOTE) The Xpert Xpress SARS-CoV-2/FLU/RSV plus assay is intended as an aid in the diagnosis of influenza from Nasopharyngeal swab specimens and should not be used as a sole basis for treatment. Nasal washings and aspirates are unacceptable for Xpert Xpress SARS-CoV-2/FLU/RSV testing.  Fact Sheet for Patients: EntrepreneurPulse.com.au  Fact Sheet for Healthcare Providers: IncredibleEmployment.be  This test is not yet approved or cleared by the [place] FDA  and has been authorized for detection and/or diagnosis of SARS-CoV-2 by FDA under an Emergency Use Authorization (EUA). This EUA will remain in effect (meaning this test can be used) for the duration of the COVID-19 declaration under Section 564(b)(1) of the Act, 21 U.S.C. section 360bbb-3(b)(1), unless the authorization is terminated or revoked.  Performed at [facility], [place] [address] [place], [place] [address]   Culture, blood (routine x 2)     Status: Abnormal (Preliminary result)   Collection Time: 03/01/21  9:45 AM   Specimen: BLOOD  Result Value Ref Range Status   Specimen Description BLOOD RIGHT ANTECUBITAL  Final   Special Requests   Final    BOTTLES DRAWN AEROBIC ONLY Blood Culture adequate volume   Culture  Setup Time   Final    GRAM POSITIVE COCCI IN CLUSTERS AEROBIC BOTTLE ONLY CRITICAL VALUE NOTED.  VALUE IS CONSISTENT WITH PREVIOUSLY REPORTED AND CALLED VALUE.    Culture (A)  Final    STAPHYLOCOCCUS AUREUS CULTURE REINCUBATED FOR BETTER GROWTH Performed at [facility], [place] [address] [place], [place] [address]    Report Status PENDING  Incomplete  Culture, blood (routine x 2)     Status: Abnormal (Preliminary result)   Collection Time: 03/01/21  9:59 AM   Specimen: BLOOD  Result Value Ref Range Status   Specimen Description BLOOD LEFT ANTECUBITAL  Final   Special Requests   Final    BOTTLES DRAWN AEROBIC AND ANAEROBIC Blood Culture adequate volume   Culture  Setup Time   Final    GRAM POSITIVE COCCI IN CLUSTERS IN BOTH AEROBIC AND ANAEROBIC BOTTLES CRITICAL RESULT CALLED TO, READ BACK BY AND VERIFIED WITH: V BRYK,[email or web address]  03/02/21 [place]    Culture (A)  Final    STAPHYLOCOCCUS AUREUS SUSCEPTIBILITIES TO FOLLOW Performed at [facility], [place] [address] [place], [place] [address]    Report Status PENDING  Incomplete  Blood Culture ID Panel (Reflexed)     Status: Abnormal   Collection Time: 03/01/21  9:59 AM  Result Value Ref Range  Status  Enterococcus faecalis NOT DETECTED NOT DETECTED Final   Enterococcus Faecium NOT DETECTED NOT DETECTED Final   Listeria monocytogenes NOT DETECTED NOT DETECTED Final   Staphylococcus species DETECTED (A) NOT DETECTED Final    Comment: CRITICAL RESULT CALLED TO, READ BACK BY AND VERIFIED WITH: V BRYK,[email or web address]  03/02/21 [place]    Staphylococcus aureus (BCID) DETECTED (A) NOT DETECTED Final    Comment: Methicillin (oxacillin)-resistant Staphylococcus aureus (MRSA). MRSA is predictably resistant to beta-lactam antibiotics (except ceftaroline). Preferred therapy is vancomycin unless clinically contraindicated. Patient requires contact precautions if  hospitalized. CRITICAL RESULT CALLED TO, READ BACK BY AND VERIFIED WITH: V BRYK,[email or web address]  03/02/21 [place]    Staphylococcus epidermidis NOT DETECTED NOT DETECTED Final   Staphylococcus lugdunensis NOT DETECTED NOT DETECTED Final   Streptococcus species NOT DETECTED NOT DETECTED Final   Streptococcus agalactiae NOT DETECTED NOT DETECTED Final   Streptococcus pneumoniae NOT DETECTED NOT DETECTED Final   Streptococcus pyogenes NOT DETECTED NOT DETECTED Final   A.calcoaceticus-baumannii NOT DETECTED NOT DETECTED Final   Bacteroides fragilis NOT DETECTED NOT DETECTED Final   Enterobacterales NOT DETECTED NOT DETECTED Final   Enterobacter cloacae complex NOT DETECTED NOT DETECTED Final   Escherichia coli NOT DETECTED NOT DETECTED Final   Klebsiella aerogenes NOT DETECTED NOT DETECTED Final   Klebsiella oxytoca NOT DETECTED NOT DETECTED Final   Klebsiella pneumoniae NOT DETECTED NOT DETECTED Final   Proteus species NOT DETECTED NOT DETECTED Final   Salmonella species NOT DETECTED NOT DETECTED Final   Serratia marcescens NOT DETECTED NOT DETECTED Final   Haemophilus influenzae NOT DETECTED NOT DETECTED Final   Neisseria meningitidis NOT DETECTED NOT DETECTED Final   Pseudomonas aeruginosa NOT DETECTED NOT DETECTED Final   Stenotrophomonas  maltophilia NOT DETECTED NOT DETECTED Final   Candida albicans NOT DETECTED NOT DETECTED Final   Candida auris NOT DETECTED NOT DETECTED Final   Candida glabrata NOT DETECTED NOT DETECTED Final   Candida krusei NOT DETECTED NOT DETECTED Final   Candida parapsilosis NOT DETECTED NOT DETECTED Final   Candida tropicalis NOT DETECTED NOT DETECTED Final   Cryptococcus neoformans/gattii NOT DETECTED NOT DETECTED Final   Meth resistant mecA/C and MREJ DETECTED (A) NOT DETECTED Final    Comment: CRITICAL RESULT CALLED TO, READ BACK BY AND VERIFIED WITH: V BRYK,[email or web address]  03/02/21 [place] Performed at [facility], [address], [place], [place] [address]     [facility] If 7PM-7AM, please contact night-coverage at [email or web address], Office  [phone]   03/03/2021, 11:29 AM  LOS: 3 days

## 2021-03-03 NOTE — Progress Notes (Addendum)
ID Brief Note   She had abdominal embolization coils placed 08/2020.  Per Radiology " unable to safely perform an MRI Chest due to the implanted coils. Per MR safety conditions and the equipment used for each specific scan, she would qualify for a brain scan only at [facility]"  Recommend to have CT surgery eval regarding CTA chest findings  Addendum: Per radiology MR chest can be done   Rosiland Oz, MD Infectious Disease Physician [facility] [address] [place], [place] [address] Phone: [phone]  Fax: [phone]

## 2021-03-04 ENCOUNTER — Inpatient Hospital Stay (HOSPITAL_COMMUNITY): Payer: 59

## 2021-03-04 ENCOUNTER — Encounter (HOSPITAL_COMMUNITY): Payer: Self-pay | Admitting: Internal Medicine

## 2021-03-04 DIAGNOSIS — R7881 Bacteremia: Secondary | ICD-10-CM

## 2021-03-04 DIAGNOSIS — B9562 Methicillin resistant Staphylococcus aureus infection as the cause of diseases classified elsewhere: Secondary | ICD-10-CM

## 2021-03-04 DIAGNOSIS — I10 Essential (primary) hypertension: Secondary | ICD-10-CM | POA: Diagnosis not present

## 2021-03-04 DIAGNOSIS — R0602 Shortness of breath: Secondary | ICD-10-CM | POA: Diagnosis not present

## 2021-03-04 DIAGNOSIS — L89150 Pressure ulcer of sacral region, unstageable: Secondary | ICD-10-CM

## 2021-03-04 DIAGNOSIS — L02213 Cutaneous abscess of chest wall: Secondary | ICD-10-CM

## 2021-03-04 DIAGNOSIS — Z515 Encounter for palliative care: Secondary | ICD-10-CM | POA: Diagnosis not present

## 2021-03-04 DIAGNOSIS — Z66 Do not resuscitate: Secondary | ICD-10-CM | POA: Diagnosis not present

## 2021-03-04 DIAGNOSIS — J9601 Acute respiratory failure with hypoxia: Secondary | ICD-10-CM | POA: Diagnosis not present

## 2021-03-04 HISTORY — PX: IR US GUIDE BX ASP/DRAIN: IMG2392

## 2021-03-04 LAB — CBC WITH DIFFERENTIAL/PLATELET
Abs Immature Granulocytes: 0.44 10*3/uL — ABNORMAL HIGH (ref 0.00–0.07)
Basophils Absolute: 0 10*3/uL (ref 0.0–0.1)
Basophils Relative: 0 %
Eosinophils Absolute: 0 10*3/uL (ref 0.0–0.5)
Eosinophils Relative: 0 %
HCT: 29.4 % — ABNORMAL LOW (ref 36.0–46.0)
Hemoglobin: 9.3 g/dL — ABNORMAL LOW (ref 12.0–15.0)
Immature Granulocytes: 7 %
Lymphocytes Relative: 4 %
Lymphs Abs: 0.3 10*3/uL — ABNORMAL LOW (ref 0.7–4.0)
MCH: 28.2 pg (ref 26.0–34.0)
MCHC: 31.6 g/dL (ref 30.0–36.0)
MCV: 89.1 fL (ref 80.0–100.0)
Monocytes Absolute: 0.1 10*3/uL (ref 0.1–1.0)
Monocytes Relative: 1 %
Neutro Abs: 5.8 10*3/uL (ref 1.7–7.7)
Neutrophils Relative %: 88 %
Platelets: 6 10*3/uL — CL (ref 150–400)
RBC: 3.3 MIL/uL — ABNORMAL LOW (ref 3.87–5.11)
RDW: 16.4 % — ABNORMAL HIGH (ref 11.5–15.5)
Smear Review: DECREASED
WBC: 6.7 10*3/uL (ref 4.0–10.5)
nRBC: 0.6 % — ABNORMAL HIGH (ref 0.0–0.2)

## 2021-03-04 LAB — MISC LABCORP TEST (SEND OUT): Labcorp test code: 716910

## 2021-03-04 LAB — GLUCOSE, CAPILLARY
Glucose-Capillary: 105 mg/dL — ABNORMAL HIGH (ref 70–99)
Glucose-Capillary: 123 mg/dL — ABNORMAL HIGH (ref 70–99)
Glucose-Capillary: 96 mg/dL (ref 70–99)
Glucose-Capillary: 129 mg/dL — ABNORMAL HIGH (ref 70–99)
Glucose-Capillary: 142 mg/dL — ABNORMAL HIGH (ref 70–99)

## 2021-03-04 LAB — CULTURE, BLOOD (ROUTINE X 2)
Special Requests: ADEQUATE
Special Requests: ADEQUATE

## 2021-03-04 LAB — BASIC METABOLIC PANEL
Anion gap: 8 (ref 5–15)
BUN: 13 mg/dL (ref 6–20)
CO2: 28 mmol/L (ref 22–32)
Calcium: 7.7 mg/dL — ABNORMAL LOW (ref 8.9–10.3)
Chloride: 101 mmol/L (ref 98–111)
Creatinine, Ser: 0.7 mg/dL (ref 0.44–1.00)
GFR, Estimated: >60 mL/min (ref >60)
Glucose, Bld: 113 mg/dL — ABNORMAL HIGH (ref 70–99)
Potassium: 2.4 mmol/L — CL (ref 3.5–5.1)
Sodium: 137 mmol/L (ref 135–145)

## 2021-03-04 LAB — SELENIUM SERUM: Selenium: 75 ug/L — ABNORMAL LOW (ref 93–198)

## 2021-03-04 LAB — MRSA NEXT GEN BY PCR, NASAL: MRSA by PCR Next Gen: DETECTED — AB

## 2021-03-04 LAB — SAVE SMEAR(SSMR), FOR PROVIDER SLIDE REVIEW

## 2021-03-04 LAB — PROTIME-INR
INR: 1.2 (ref 0.8–1.2)
Prothrombin Time: 15 seconds (ref 11.4–15.2)

## 2021-03-04 LAB — IMMATURE PLATELET FRACTION: Immature Platelet Fraction: 16 % — ABNORMAL HIGH (ref 1.2–8.6)

## 2021-03-04 LAB — MAGNESIUM: Magnesium: 1.6 mg/dL — ABNORMAL LOW (ref 1.7–2.4)

## 2021-03-04 MED ORDER — LIDOCAINE HCL 1 % IJ SOLN
INTRAMUSCULAR | Status: AC
  Filled 2021-03-04: qty 20

## 2021-03-04 MED ORDER — MAGNESIUM SULFATE 2 GM/50ML IV SOLN
2.0000 g | Freq: Once | INTRAVENOUS | Status: AC
  Administered 2021-03-04: 2 g via INTRAVENOUS
  Filled 2021-03-04: qty 50

## 2021-03-04 MED ORDER — POTASSIUM CHLORIDE 10 MEQ/100ML IV SOLN
10.0000 meq | INTRAVENOUS | Status: AC
Start: 2021-03-04 — End: 2021-03-04
  Administered 2021-03-04 (×5): 10 meq via INTRAVENOUS
  Filled 2021-03-04 (×6): qty 100

## 2021-03-04 MED ORDER — LIDOCAINE HCL 1 % IJ SOLN
INTRAMUSCULAR | Status: AC
  Filled 2021-03-04: qty 10

## 2021-03-04 NOTE — Progress Notes (Signed)
INFECTIOUS DISEASE PROGRESS NOTE  ID: Rhonda [name] is a 60 y.o. female with  Principal Problem:   Shortness of breath Active Problems:   Essential hypertension   OSA (obstructive sleep apnea)   HLD (hyperlipidemia)   Neurosarcoidosis   Type 2 diabetes mellitus without complication, with long-term current use of insulin (HCC)   Unstageable pressure ulcer of sacral region ([place])   Hypokalemia   DNR (do not resuscitate)   Malnutrition of moderate degree  Subjective: No complaints.   Abtx:  Anti-infectives (From admission, onward)    Start     Dose/Rate Route Frequency Ordered Stop   03/02/21 1800  Ampicillin-Sulbactam (UNASYN) 3 g in sodium chloride 0.9 % 100 mL IVPB        3 g 200 mL/hr over 30 Minutes Intravenous Every 6 hours 03/02/21 1652     03/02/21 1200  vancomycin (VANCOCIN) IVPB 1000 mg/200 mL premix        1,000 mg 200 mL/hr over 60 Minutes Intravenous Every 24 hours 03/01/21 1326     03/01/21 1230  vancomycin (VANCOREADY) IVPB 1750 mg/350 mL        1,750 mg 175 mL/hr over 120 Minutes Intravenous  Once 03/01/21 1123 03/01/21 1459   03/01/21 1200  piperacillin-tazobactam (ZOSYN) IVPB 3.375 g  Status:  Discontinued        3.375 g 12.5 mL/hr over 240 Minutes Intravenous Every 8 hours 03/01/21 1123 03/02/21 0021       Medications: Scheduled:  atorvastatin  80 mg Oral q1800   chlorhexidine  15 mL Mouth Rinse BID   collagenase   Topical BID   feeding supplement (GLUCERNA SHAKE)  237 mL Oral BID BM   ferrous sulfate  325 mg Oral Q breakfast   gabapentin  100 mg Oral TID   hydrocortisone sod succinate (SOLU-CORTEF) inj  100 mg Intravenous Q8H   insulin aspart  0-15 Units Subcutaneous TID WC   insulin aspart  0-5 Units Subcutaneous QHS   insulin glargine-yfgn  10 Units Subcutaneous QHS   mouth rinse  15 mL Mouth Rinse q12n4p   melatonin  10 mg Oral QHS   multivitamin with minerals  1 tablet Oral Daily   nystatin  5 mL Oral QID   pantoprazole  40 mg Oral Daily    silver nitrate applicators  1 application Topical Once   sodium chloride flush  3 mL Intravenous Q12H    Objective: Vital signs in last 24 hours: Temp:  [97.9 F (36.6 C)-98 F (36.7 C)] 98 F (36.7 C) (10/16 2336) Pulse Rate:  [95-103] 95 (10/16 2336) Resp:  [15] 15 (10/16 2213) BP: (122-126)/(91-93) 122/93 (10/16 2336) SpO2:  [99 %-100 %] 100 % (10/16 2336)   General appearance: cooperative and fatigued Neck: swelling R sided, fluctuance Resp: clear to auscultation bilaterally Chest wall: swelling R upper, fluctuance.  Cardio: regularly irregular rhythm GI: normal findings: bowel sounds normal and soft, non-tender  Lab Results Recent Labs    03/02/21 0311 03/02/21 0411 03/04/21 0415  WBC  --  14.2* 6.7  HGB  --  11.3* 9.3*  HCT  --  36.6 29.4*  NA 129*  --  137  K 4.8  --  2.4*  CL 94*  --  101  CO2 19*  --  28  BUN 17  --  13  CREATININE 0.78  --  0.70   Liver Panel No results for input(s): PROT, ALBUMIN, AST, ALT, ALKPHOS, BILITOT, BILIDIR, IBILI in the last 72  hours. Sedimentation Rate No results for input(s): ESRSEDRATE in the last 72 hours. C-Reactive Protein No results for input(s): CRP in the last 72 hours.  Microbiology: Recent Results (from the past 240 hour(s))  Resp Panel by RT-PCR (Flu A&B, Covid) Nasopharyngeal Swab     Status: None   Collection Time: 02/28/21  1:55 AM   Specimen: Nasopharyngeal Swab; Nasopharyngeal(NP) swabs in vial transport medium  Result Value Ref Range Status   SARS Coronavirus 2 by RT PCR NEGATIVE NEGATIVE Final    Comment: (NOTE) SARS-CoV-2 target nucleic acids are NOT DETECTED.  The SARS-CoV-2 RNA is generally detectable in upper respiratory specimens during the acute phase of infection. The lowest concentration of SARS-CoV-2 viral copies this assay can detect is 138 copies/mL. A negative result does not preclude SARS-Cov-2 infection and should not be used as the sole basis for treatment or other patient  management decisions. A negative result may occur with  improper specimen collection/handling, submission of specimen other than nasopharyngeal swab, presence of viral mutation(s) within the areas targeted by this assay, and inadequate number of viral copies(<138 copies/mL). A negative result must be combined with clinical observations, patient history, and epidemiological information. The expected result is Negative.  Fact Sheet for Patients:  EntrepreneurPulse.com.au  Fact Sheet for Healthcare Providers:  IncredibleEmployment.be  This test is no t yet approved or cleared by the [place] FDA and  has been authorized for detection and/or diagnosis of SARS-CoV-2 by FDA under an Emergency Use Authorization (EUA). This EUA will remain  in effect (meaning this test can be used) for the duration of the COVID-19 declaration under Section 564(b)(1) of the Act, 21 U.S.C.section 360bbb-3(b)(1), unless the authorization is terminated  or revoked sooner.       Influenza A by PCR NEGATIVE NEGATIVE Final   Influenza B by PCR NEGATIVE NEGATIVE Final    Comment: (NOTE) The Xpert Xpress SARS-CoV-2/FLU/RSV plus assay is intended as an aid in the diagnosis of influenza from Nasopharyngeal swab specimens and should not be used as a sole basis for treatment. Nasal washings and aspirates are unacceptable for Xpert Xpress SARS-CoV-2/FLU/RSV testing.  Fact Sheet for Patients: EntrepreneurPulse.com.au  Fact Sheet for Healthcare Providers: IncredibleEmployment.be  This test is not yet approved or cleared by the [place] FDA and has been authorized for detection and/or diagnosis of SARS-CoV-2 by FDA under an Emergency Use Authorization (EUA). This EUA will remain in effect (meaning this test can be used) for the duration of the COVID-19 declaration under Section 564(b)(1) of the Act, 21 U.S.C. section 360bbb-3(b)(1),  unless the authorization is terminated or revoked.  Performed at [facility], [place] [address], [place], [place] [address]   Culture, blood (routine x 2)     Status: Abnormal   Collection Time: 03/01/21  9:45 AM   Specimen: BLOOD  Result Value Ref Range Status   Specimen Description BLOOD RIGHT ANTECUBITAL  Final   Special Requests   Final    BOTTLES DRAWN AEROBIC ONLY Blood Culture adequate volume   Culture  Setup Time   Final    GRAM POSITIVE COCCI IN CLUSTERS AEROBIC BOTTLE ONLY CRITICAL VALUE NOTED.  VALUE IS CONSISTENT WITH PREVIOUSLY REPORTED AND CALLED VALUE.    Culture (A)  Final    STAPHYLOCOCCUS AUREUS SUSCEPTIBILITIES PERFORMED ON PREVIOUS CULTURE WITHIN THE LAST 5 DAYS. Performed at [facility], [place] [address] [place], [place] [address]    Report Status 03/04/2021 FINAL  Final  Culture, blood (routine x  2)     Status: Abnormal   Collection Time: 03/01/21  9:59 AM   Specimen: BLOOD  Result Value Ref Range Status   Specimen Description BLOOD LEFT ANTECUBITAL  Final   Special Requests   Final    BOTTLES DRAWN AEROBIC AND ANAEROBIC Blood Culture adequate volume   Culture  Setup Time   Final    GRAM POSITIVE COCCI IN CLUSTERS IN BOTH AEROBIC AND ANAEROBIC BOTTLES CRITICAL RESULT CALLED TO, READ BACK BY AND VERIFIED WITH: V BRYK,PHARMD@[email or web address]  03/02/21 [place] Performed at [facility], [place] [address], [place], [place] [address]    Culture METHICILLIN RESISTANT STAPHYLOCOCCUS AUREUS (A)  Final   Report Status 03/04/2021 FINAL  Final   Organism ID, Bacteria METHICILLIN RESISTANT STAPHYLOCOCCUS AUREUS  Final      Susceptibility   Methicillin resistant staphylococcus aureus - MIC*    CIPROFLOXACIN <=0.5 SENSITIVE Sensitive     ERYTHROMYCIN >=8 RESISTANT Resistant     GENTAMICIN <=0.5 SENSITIVE Sensitive     OXACILLIN >=4 RESISTANT Resistant     TETRACYCLINE <=1 SENSITIVE Sensitive     VANCOMYCIN 1 SENSITIVE Sensitive     TRIMETH/SULFA <=10 SENSITIVE  Sensitive     CLINDAMYCIN <=0.25 SENSITIVE Sensitive     RIFAMPIN <=0.5 SENSITIVE Sensitive     Inducible Clindamycin NEGATIVE Sensitive     * METHICILLIN RESISTANT STAPHYLOCOCCUS AUREUS  Blood Culture ID Panel (Reflexed)     Status: Abnormal   Collection Time: 03/01/21  9:59 AM  Result Value Ref Range Status   Enterococcus faecalis NOT DETECTED NOT DETECTED Final   Enterococcus Faecium NOT DETECTED NOT DETECTED Final   Listeria monocytogenes NOT DETECTED NOT DETECTED Final   Staphylococcus species DETECTED (A) NOT DETECTED Final    Comment: CRITICAL RESULT CALLED TO, READ BACK BY AND VERIFIED WITH: V BRYK,PHARMD@0010  03/02/21 [place]    Staphylococcus aureus (BCID) DETECTED (A) NOT DETECTED Final    Comment: Methicillin (oxacillin)-resistant Staphylococcus aureus (MRSA). MRSA is predictably resistant to beta-lactam antibiotics (except ceftaroline). Preferred therapy is vancomycin unless clinically contraindicated. Patient requires contact precautions if  hospitalized. CRITICAL RESULT CALLED TO, READ BACK BY AND VERIFIED WITH: V BRYK,PHARMD@[email or web address]  03/02/21 [place]    Staphylococcus epidermidis NOT DETECTED NOT DETECTED Final   Staphylococcus lugdunensis NOT DETECTED NOT DETECTED Final   Streptococcus species NOT DETECTED NOT DETECTED Final   Streptococcus agalactiae NOT DETECTED NOT DETECTED Final   Streptococcus pneumoniae NOT DETECTED NOT DETECTED Final   Streptococcus pyogenes NOT DETECTED NOT DETECTED Final   A.calcoaceticus-baumannii NOT DETECTED NOT DETECTED Final   Bacteroides fragilis NOT DETECTED NOT DETECTED Final   Enterobacterales NOT DETECTED NOT DETECTED Final   Enterobacter cloacae complex NOT DETECTED NOT DETECTED Final   Escherichia coli NOT DETECTED NOT DETECTED Final   Klebsiella aerogenes NOT DETECTED NOT DETECTED Final   Klebsiella oxytoca NOT DETECTED NOT DETECTED Final   Klebsiella pneumoniae NOT DETECTED NOT DETECTED Final   Proteus species NOT DETECTED NOT DETECTED  Final   Salmonella species NOT DETECTED NOT DETECTED Final   Serratia marcescens NOT DETECTED NOT DETECTED Final   Haemophilus influenzae NOT DETECTED NOT DETECTED Final   Neisseria meningitidis NOT DETECTED NOT DETECTED Final   Pseudomonas aeruginosa NOT DETECTED NOT DETECTED Final   Stenotrophomonas maltophilia NOT DETECTED NOT DETECTED Final   Candida albicans NOT DETECTED NOT DETECTED Final   Candida auris NOT DETECTED NOT DETECTED Final   Candida glabrata NOT DETECTED NOT DETECTED Final   Candida krusei NOT DETECTED NOT DETECTED Final  Candida parapsilosis NOT DETECTED NOT DETECTED Final   Candida tropicalis NOT DETECTED NOT DETECTED Final   Cryptococcus neoformans/gattii NOT DETECTED NOT DETECTED Final   Meth resistant mecA/C and MREJ DETECTED (A) NOT DETECTED Final    Comment: CRITICAL RESULT CALLED TO, READ BACK BY AND VERIFIED WITH: V BRYK,PHARMD@0010  03/02/21 [place] Performed at [facility], [place] [address] [place], [place] [address]   Culture, blood (routine x 2)     Status: None (Preliminary result)   Collection Time: 03/03/21  4:33 AM   Specimen: BLOOD RIGHT HAND  Result Value Ref Range Status   Specimen Description BLOOD RIGHT HAND  Final   Special Requests   Final    BOTTLES DRAWN AEROBIC ONLY Blood Culture results may not be optimal due to an inadequate volume of blood received in culture bottles   Culture   Final    NO GROWTH 1 DAY Performed at [facility], [place] [address], [place], [place] [address]    Report Status PENDING  Incomplete  Culture, blood (routine x 2)     Status: None (Preliminary result)   Collection Time: 03/03/21  4:58 AM   Specimen: BLOOD LEFT HAND  Result Value Ref Range Status   Specimen Description BLOOD LEFT HAND  Final   Special Requests   Final    BOTTLES DRAWN AEROBIC ONLY Blood Culture results may not be optimal due to an inadequate volume of blood received in culture bottles   Culture  Setup Time   Final    GRAM POSITIVE  COCCI IN CLUSTERS AEROBIC BOTTLE ONLY CRITICAL VALUE NOTED.  VALUE IS CONSISTENT WITH PREVIOUSLY REPORTED AND CALLED VALUE.    Culture   Final    NO GROWTH 1 DAY Performed at [facility], [place] [address], [place], [place] [address]    Report Status PENDING  Incomplete    Studies/Results: MR CHEST W WO CONTRAST  Result Date: 03/03/2021 CLINICAL DATA:  Evaluate abscess EXAM: MRI CHEST WITHOUT AND WITH CONTRAST TECHNIQUE: Multiplanar, multisequence MR examination the upper right chest centered about the right sternoclavicular joint was performed both prior to and following the uncomplicated IV administration gadolinium contrast. CONTRAST:  7.69mL GADAVIST GADOBUTROL 1 MMOL/ML IV SOLN COMPARISON:  CT chest angiogram, 02/28/2021 FINDINGS: Limited mediastinum: Unremarkable. Limited lungs and pleura: Unremarkable. Musculoskeletal: Large, rim enhancing fluid collection centered about the right sternoclavicular joint, measuring 5.8 x 4.2 cm. There is no overt bony destruction or bone marrow edema (series 16, image 15). Additional, likely contiguous air and fluid collection with similar rim enhancement just superior to the sternal notch, incompletely imaged on this examination measuring approximately 6.3 x 4.0 cm (series 16, image 9). IMPRESSION: 1. Large, rim enhancing fluid collection centered about the right sternoclavicular joint, measuring 5.8 x 4.2 cm. 2. Additional, likely contiguous air and fluid collection with similar rim enhancement just superior to the sternal notch, incompletely imaged on this examination measuring approximately 6.3 x 4.0 cm. 3. No overt bony destruction or associated bone marrow edema. 4. Findings are most consistent with septic arthritis. Consider percutaneous fluid sampling. Electronically Signed   By: Delanna Ahmadi M.D.   On: 03/03/2021 15:07   ECHOCARDIOGRAM COMPLETE  Result Date: 03/03/2021    ECHOCARDIOGRAM REPORT   Patient Name:   Rhonda [name] [facility] Date of Exam:  03/03/2021 Medical Rec #:  [id number]      Height:       63.0 in Accession #:    [id number]  Weight:       178.0 lb Date of Birth:  1960/07/20      BSA:          1.840 m Patient Age:    60 years       BP:           132/70 mmHg Patient Gender: F              HR:           92 bpm. Exam Location:  Inpatient Procedure: 2D Echo, Cardiac Doppler and Color Doppler Indications:    Bacteremia  History:        Patient has prior history of Echocardiogram examinations, most                 recent 01/15/2020. TIA, Signs/Symptoms:Shortness of Breath; Risk                 Factors:Hypertension and Diabetes.  Sonographer:    Wenda Low Referring Phys: [id number] [place]  1. Left ventricular ejection fraction, by estimation, is 50 to 55% with beat to beat variability. The left ventricle has low normal function. Left ventricular endocardial border not optimally defined to evaluate regional wall motion. There is mild left ventricular hypertrophy. Indeterminate diastolic filling due to E-A fusion.  2. Right ventricular systolic function is normal. The right ventricular size is normal. There is normal pulmonary artery systolic pressure. The estimated right ventricular systolic pressure is 71.0 mmHg.  3. A small pericardial effusion is present. The pericardial effusion is posterior to the left ventricle.  4. The mitral valve is normal in structure. Trivial mitral valve regurgitation. No evidence of mitral stenosis.  5. The aortic valve is tricuspid. There is mild calcification of the aortic valve. Aortic valve regurgitation is trivial. No aortic stenosis is present.  6. The inferior vena cava is normal in size with greater than 50% respiratory variability, suggesting right atrial pressure of 3 mmHg. Conclusion(s)/Recommendation(s): No evidence of valvular vegetations on this transthoracic echocardiogram. Consider a transesophageal echocardiogram to exclude infective endocarditis if clinically indicated. FINDINGS   Left Ventricle: Left ventricular ejection fraction, by estimation, is 50 to 55%. The left ventricle has low normal function. Left ventricular endocardial border not optimally defined to evaluate regional wall motion. The left ventricular internal cavity  size was normal in size. There is mild left ventricular hypertrophy. Indeterminate diastolic filling due to E-A fusion. Right Ventricle: The right ventricular size is normal. No increase in right ventricular wall thickness. Right ventricular systolic function is normal. There is normal pulmonary artery systolic pressure. The tricuspid regurgitant velocity is 2.78 m/s, and  with an assumed right atrial pressure of 3 mmHg, the estimated right ventricular systolic pressure is 62.6 mmHg. Left Atrium: Left atrial size was normal in size. Right Atrium: Right atrial size was normal in size. Pericardium: A small pericardial effusion is present. The pericardial effusion is posterior to the left ventricle. Presence of pericardial fat pad. Mitral Valve: The mitral valve is normal in structure. Trivial mitral valve regurgitation. No evidence of mitral valve stenosis. MV peak gradient, 4.9 mmHg. The mean mitral valve gradient is 1.0 mmHg. Tricuspid Valve: The tricuspid valve is normal in structure. Tricuspid valve regurgitation is mild . No evidence of tricuspid stenosis. Aortic Valve: The aortic valve is tricuspid. There is mild calcification of the aortic valve. Aortic valve regurgitation is trivial. No aortic stenosis is present. Aortic valve mean gradient measures 5.7 mmHg. Aortic valve peak gradient measures 12.0  mmHg. Aortic valve area, by VTI measures 1.80 cm. Pulmonic Valve: The pulmonic valve was normal in structure. Pulmonic valve regurgitation is trivial. No evidence of pulmonic stenosis. Aorta: The aortic root is normal in size and structure. Venous: The inferior vena cava is normal in size with greater than 50% respiratory variability, suggesting right atrial  pressure of 3 mmHg. IAS/Shunts: The interatrial septum was not well visualized.  LEFT VENTRICLE PLAX 2D LVIDd:         3.90 cm   Diastology LVIDs:         2.50 cm   LV e' medial:    5.22 cm/s LV PW:         1.20 cm   LV E/e' medial:  10.9 LV IVS:        1.10 cm   LV e' lateral:   3.81 cm/s LVOT diam:     2.00 cm   LV E/e' lateral: 15.0 LV SV:         49 LV SV Index:   26 LVOT Area:     3.14 cm  RIGHT VENTRICLE RV Basal diam:  2.75 cm RV Mid diam:    2.20 cm LEFT ATRIUM             Index        RIGHT ATRIUM           Index LA diam:        2.60 cm 1.41 cm/m   RA Area:     11.10 cm LA Vol (A2C):   35.9 ml 19.51 ml/m  RA Volume:   22.50 ml  12.23 ml/m LA Vol (A4C):   47.9 ml 26.03 ml/m LA Biplane Vol: 43.8 ml 23.80 ml/m  AORTIC VALVE                     PULMONIC VALVE AV Area (Vmax):    1.92 cm      PV Vmax:       0.76 m/s AV Area (Vmean):   1.81 cm      PV Peak grad:  2.3 mmHg AV Area (VTI):     1.80 cm AV Vmax:           173.55 cm/s AV Vmean:          109.859 cm/s AV VTI:            0.271 m AV Peak Grad:      12.0 mmHg AV Mean Grad:      5.7 mmHg LVOT Vmax:         106.00 cm/s LVOT Vmean:        63.300 cm/s LVOT VTI:          0.155 m LVOT/AV VTI ratio: 0.57  AORTA Ao Root diam: 3.00 cm MITRAL VALVE                TRICUSPID VALVE MV Area (PHT): 4.44 cm     TR Peak grad:   30.9 mmHg MV Area VTI:   1.80 cm     TR Vmax:        278.00 cm/s MV Peak grad:  4.9 mmHg MV Mean grad:  1.0 mmHg     SHUNTS MV Vmax:       1.11 m/s     Systemic VTI:  0.16 m MV Vmean:      53.6 cm/s    Systemic Diam: 2.00 cm MV Decel Time: 171 msec MV E velocity: 57.00 cm/s  MV A velocity: 111.00 cm/s MV E/A ratio:  0.51 Cherlynn Kaiser MD Electronically signed by Cherlynn Kaiser MD Signature Date/Time: 03/03/2021/1:25:09 PM    Final      Assessment/Plan: Neurosarcoidosis Sacral decubitus (no osteo, recently debrided) MRSA bacteremia R chest wall abscess,  joint  Total days of antibiotics: vancomycin (10-14 -->) IR to aspirate  abscess.   CVTS not going to debride at this time.  Repeat BCx sent 10-16, ngtd.  TTE no IE, needs TEE. Family with concerns about her recurrent issues. I suggested palliative care eval for long term planning.          Bobby Rumpf MD, FACP Infectious Diseases (pager) [phone] [email or web address] 03/04/2021, 10:31 AM  LOS: 4 days

## 2021-03-04 NOTE — Consult Note (Signed)
Chief Complaint: Patient was seen in consultation today for neck/chest wall abscess aspiration at the request of Dr Georgiann Mohs and Dr Kipp Brood    Supervising Physician: Corrie Mckusick  Patient Status: [facility] - In-pt  History of Present Illness: Rhonda [name] is a 60 y.o. female   HTN; OSA; Neurosarcoidosis Sz disorder; SOB Perf colon and Hartmans pouch 09/2020 Delayed wound healing - with wound vac  Debilitated; bedbound; large sacral decub Surgery-- not surgical candidate for debridement: MRSA; malnourished; steroids  New abscess found on CTA for PE 02/28/21  MRI yesterday:  IMPRESSION: 1. Large, rim enhancing fluid collection centered about the right sternoclavicular joint, measuring 5.8 x 4.2 cm. 2. Additional, likely contiguous air and fluid collection with similar rim enhancement just superior to the sternal notch, incompletely imaged on this examination measuring approximately 6.3 x 4.0 cm. 3. No overt bony destruction or associated bone marrow edema. 4. Findings are most consistent with septic arthritis. Consider percutaneous fluid sampling.   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   Degenerative disc disease, lumbar    DNR (do not resuscitate) 02/28/2021   GERD (gastroesophageal reflux disease)    Hypertension    OSA (obstructive sleep apnea)    not currently on CPAP   Prediabetes    Sarcoidosis of central nervous system    Seizures (HCC)    Type 2    Urinary incontinence     Past Surgical History:  Procedure Laterality Date   ABDOMINAL HYSTERECTOMY     BOWEL RESECTION N/A 09/01/2020   Procedure: SMALL BOWEL RESECTION;  Surgeon: Georganna Skeans, MD;  Location: [place];  Service: General;  Laterality: N/A;   BREAST EXCISIONAL BIOPSY Right    BREAST  SURGERY     right breast   CESAREAN SECTION     COLONOSCOPY WITH PROPOFOL N/A 07/26/2020   Procedure: COLONOSCOPY WITH PROPOFOL;  Surgeon: Carol Ada, MD;  Location: [place];  Service: Endoscopy;  Laterality: N/A;   ESOPHAGOGASTRODUODENOSCOPY (EGD) WITH PROPOFOL N/A 07/24/2020   Procedure: ESOPHAGOGASTRODUODENOSCOPY (EGD) WITH PROPOFOL;  Surgeon: Carol Ada, MD;  Location: [place] [facility];  Service: Endoscopy;  Laterality: N/A;  Bleeding with anemia-5 gms drop in hemoglobin   GIVENS CAPSULE STUDY N/A 08/21/2020   Procedure: GIVENS CAPSULE STUDY;  Surgeon: Juanita Craver, MD;  Location: [facility] ENDOSCOPY;  Service: Endoscopy;  Laterality: N/A;   IR ANGIOGRAM FOLLOW UP STUDY  08/29/2020   IR ANGIOGRAM SELECTIVE EACH ADDITIONAL VESSEL  08/29/2020   IR ANGIOGRAM VISCERAL SELECTIVE  08/29/2020   IR ANGIOGRAM VISCERAL SELECTIVE  08/29/2020   IR EMBO ART  VEN HEMORR LYMPH EXTRAV  INC GUIDE ROADMAPPING  08/29/2020   IR US GUIDE VASC ACCESS RIGHT  08/29/2020   LAPAROTOMY N/A 09/01/2020   Procedure: EXPLORATORY LAPAROTOMY;  Surgeon: Georganna Skeans, MD;  Location: [place];  Service: General;  Laterality: N/A;    Allergies: Aspirin and Augmentin [amoxicillin-pot clavulanate]  Medications: Prior to Admission medications   Medication Sig Start Date End Date Taking? Authorizing Provider  acetaminophen (TYLENOL) 500 MG tablet Take 1,000 mg by mouth every 6 (six) hours as needed for moderate pain or headache.   Yes [provider]  apixaban (ELIQUIS) 5 MG TABS tablet Take 1 tablet (5 mg total) by mouth 2 (two) times daily. 10/04/20  Yes Debbe Odea, MD  atorvastatin (LIPITOR) 80 MG tablet Take 1 tablet (80 mg total) by mouth daily at 6 PM. 12/03/20 03/03/21 Yes Thurnell Lose, MD  b complex vitamins tablet Take 1 tablet by mouth daily.   Yes [provider]  blood glucose meter kit and supplies KIT Dispense based on patient and insurance preference. Use up to four times daily as directed.  07/27/20  Yes Elgergawy, Silver Huguenin, MD  Calcium Carb-Cholecalciferol (CALCIUM+D3 PO) Take 1 tablet by mouth daily with lunch.   Yes [provider]  Ensure Max Protein (ENSURE MAX PROTEIN) LIQD Take 330 mLs (11 oz total) by mouth at bedtime. Patient taking differently: Take 11 oz by mouth daily as needed (if not eating). 09/28/20  Yes Debbe Odea, MD  FEROSUL 325 (65 Fe) MG tablet Take 325 mg by mouth daily with breakfast. 07/27/20  Yes [provider]  gabapentin (NEURONTIN) 100 MG capsule Take 100 mg by mouth 3 (three) times daily. 08/01/20  Yes [provider]  glucosamine-chondroitin 500-400 MG tablet Take 1 tablet by mouth 3 (three) times daily. Patient taking differently: Take 1 tablet by mouth in the morning and at bedtime. 04/11/15  Yes Funches, Josalyn, MD  insulin aspart (NOVOLOG) 100 UNIT/ML FlexPen Inject 1-20 Units into the skin 3 (three) times daily with meals. CBG 70 - 120: 0 units  CBG 121 - 150: 3 units  CBG 151 - 200: 4 units  CBG 201 - 250: 7 units  CBG 251 - 300: 11 units  CBG 301 - 350: 15 units  CBG 351 - 400: 20 units 09/28/20  Yes Rizwan, Eunice Blase, MD  insulin glargine (LANTUS) 100 UNIT/ML injection Inject 0.14 mLs (14 Units total) into the skin at bedtime. Patient taking differently: Inject 25-28 Units into the skin 2 (two) times daily. 28 units in the morning 25 units at bedtime 09/28/20  Yes Rizwan, Eunice Blase, MD  Insulin Pen Needle 29G X 5MM MISC USE WITH LANTUS 07/27/20  Yes Elgergawy, Silver Huguenin, MD  Melatonin 10 MG TABS Take 10 mg by mouth at bedtime.   Yes [provider]  metoprolol tartrate (LOPRESSOR) 25 MG tablet Take 25 mg by mouth 2 (two) times daily. 11/08/20  Yes [provider]  Multiple Vitamin (MULTIVITAMIN WITH MINERALS) TABS tablet Take 1 tablet by mouth daily with lunch.    Yes [provider]  Omega-3 Fatty Acids (FISH OIL) 1000 MG CAPS Take 1,000 mg by mouth daily with lunch.    Yes [provider]   ondansetron (ZOFRAN ODT) 4 MG disintegrating tablet Take 1 tablet (4 mg total) by mouth every 8 (eight) hours as needed for nausea or vomiting. 09/28/20  Yes Debbe Odea, MD  pantoprazole (PROTONIX) 40 MG tablet Take 1 tablet (40 mg total) by mouth daily. 01/17/20  Yes Regalado, Belkys A, MD  polyethylene glycol (MIRALAX / GLYCOLAX) 17 g packet Take 17 g by mouth 2 (two) times daily. Patient taking differently: Take 17 g by mouth daily as needed for mild constipation. 01/17/20  Yes Regalado, Belkys A, MD  predniSONE (DELTASONE) 20 MG tablet Take 40 mg by mouth 2 (two) times daily with a meal. 07/17/20  Yes [provider]  REFRESH 1.4-0.6 % SOLN Place 1 drop into both eyes daily as needed (dry eyes). 10/18/20  Yes [provider]  silver sulfADIAZINE (SILVADENE) 1 % cream Apply 1 application topically daily as needed (wound care). 02/07/21  Yes [provider]  Family History  Problem Relation Age of Onset   Cancer Mother    Hypertension Sister    Diabetes Sister    Diabetes Maternal Grandmother     Social History   Socioeconomic History   Marital status: Married    Spouse name: Not on file   Number of children: 4   Years of education: 12   Highest education level: Not on file  Occupational History   Occupation: disabled  Tobacco Use   Smoking status: Never   Smokeless tobacco: Never  Vaping Use   Vaping Use: Never used  Substance and Sexual Activity   Alcohol use: No   Drug use: No   Sexual activity: Yes    Birth control/protection: None  Other Topics Concern   Not on file  Social History Narrative   Right handed   Tea sometimes, hot chocolate    Social Determinants of Health   Financial Resource Strain: Not on file  Food Insecurity: Not on file  Transportation Needs: Not on file  Physical Activity: Not on file  Stress: Not on file  Social Connections: Not on file    Review of Systems: A 12 point ROS discussed and pertinent positives are  indicated in the HPI above.  All other systems are negative.    Vital Signs: BP (!) 122/93 (BP Location: Right Wrist)   Pulse 95   Temp 98 F (36.7 C) (Oral)   Resp 15   Ht $R'5\' 3"'qd$  (1.6 m)   Wt 178 lb (80.7 kg)   SpO2 100%   BMI 31.53 kg/m   Physical Exam Vitals reviewed.  Constitutional:      Comments: Asleep No communication to me   Cardiovascular:     Rate and Rhythm: Normal rate and regular rhythm.  Pulmonary:     Effort: Pulmonary effort is normal.     Breath sounds: Normal breath sounds.  Abdominal:     Palpations: Abdomen is soft.  Musculoskeletal:     Comments: Does not follow commands  Psychiatric:     Comments: Husband at bedside He has signed consent for IR procedure    Imaging: CT Angio Chest PE W and/or Wo Contrast  Result Date: 02/28/2021 CLINICAL DATA:  Shortness of breath. EXAM: CT ANGIOGRAPHY CHEST WITH CONTRAST TECHNIQUE: Multidetector CT imaging of the chest was performed using the standard protocol during bolus administration of intravenous contrast. Multiplanar CT image reconstructions and MIPs were obtained to evaluate the vascular anatomy. CONTRAST:  61mL OMNIPAQUE IOHEXOL 350 MG/ML SOLN COMPARISON:  CT scan 09/21/2020 FINDINGS: Cardiovascular: The heart is mildly enlarged but stable. No pericardial effusion. Stable tortuosity and mild ectasia of the thoracic aorta but no aneurysm or dissection. Minimal atherosclerotic calcification at the aortic arch. The branch vessels are patent. No definite coronary artery calcifications. The pulmonary arterial tree is well opacified. Small bandlike filling defect noted in the proximal right upper lobe pulmonary artery in an area of a prior pulmonary embolus consistent with a fibrotic band. No findings suspicious for acute PE. Mediastinum/Nodes: There is a stable fluid collection noted near the left atrium and inferior pulmonary vein on the right side. This may be some type of epicardial cyst. No mediastinal or hilar  adenopathy. The esophagus is grossly. Lungs/Pleura: Chronic appearing patchy mosaic pattern of ground-glass attenuation could be asymmetric pulmonary edema, small airways disease such as asthma or respiratory bronchiolitis. Other possibilities would include cryptogenic organizing pneumonia or hypersensitivity pneumonitis. There are also small bilateral pleural effusions and overlying atelectasis. No  worrisome pulmonary lesions. Upper Abdomen: No significant upper abdominal findings. Musculoskeletal: No breast masses are identified. There is a complex appearing fluid collection containing gas involving the right chest wall anteriorly. The upper part of this abscess is anterior to the thyroid gland and it appears it may communicate with a second lower abscess near the right sternoclavicular joint and anterior to the right clavicular head. Do not see any definite CT findings for septic arthritis at the right [place] joint that would be a possibility. This measures a maximum of 7.5 cm. Aspiration may be helpful for diagnostic and therapeutic purposes. MRI examination of the chest wall may be helpful for further evaluation. Review of the MIP images confirms the above findings. IMPRESSION: 1. Small bandlike filling defect in the proximal right upper lobe pulmonary artery in an area of a prior pulmonary embolus consistent with a fibrotic band. No findings suspicious for acute PE. 2. Stable tortuosity and mild ectasia of the thoracic aorta but no aneurysm or dissection. 3. Chronic appearing patchy mosaic pattern of ground-glass attenuation could be asymmetric pulmonary edema, small airways disease such as asthma or respiratory bronchiolitis. Other possibilities would include cryptogenic organizing pneumonia or hypersensitivity pneumonitis. 4. Small bilateral pleural effusions and overlying atelectasis. 5. 7.5 cm anterior chest wall abscess of uncertain origin. Right sternoclavicular joint septic arthritis would be a possibility  although I do not see any definite CT evidence of this. MRI may be helpful for further evaluation. Electronically Signed   By: Marijo Sanes M.D.   On: 02/28/2021 05:47   CT PELVIS W CONTRAST  Result Date: 02/17/2021 CLINICAL DATA:  Sacral wound with increased bleeding. Evaluate wound depth and for osteomyelitis EXAM: CT PELVIS WITH CONTRAST TECHNIQUE: Multidetector CT imaging of the pelvis was performed using the standard protocol following the bolus administration of intravenous contrast. CONTRAST:  166m OMNIPAQUE IOHEXOL 350 MG/ML SOLN COMPARISON:  11/22/2020 FINDINGS: Urinary Tract:  Unremarkable Bowel: Descending colostomy. No bowel obstruction or visible inflammation where covered Vascular/Lymphatic: Mild atheromatous calcification. No acute vascular finding or adenopathy. Reproductive:  Hysterectomy.  Negative adnexa Other:  No pelvic ascites. Musculoskeletal: Right para median to midline sacral decubitus ulcer deepest at the inferior extent where there is either internal debris or packing extending towards the anorectal junction with regional inflammation. From this point there is superiorly migrating soft tissue emphysema tracking upward along the posterior coccyx. No visible osteomyelitis or fracture. No rim enhancing fluid collection. A hematoma posterior to the left hip has organized and become low-density, 9 x 3.4 cm on axial slices. Generalized muscular atrophy. Severe lower lumbar spine degeneration where covered. IMPRESSION: 1. Sacral decubitus ulcer with regional cellulitis and soft tissue emphysema, as above. No osteomyelitis or drainable fluid collection. 2. Expected evolution of hematoma posterior to the left hip. Electronically Signed   By: JJorje GuildM.D.   On: 02/17/2021 09:32   MR CHEST W WO CONTRAST  Result Date: 03/03/2021 CLINICAL DATA:  Evaluate abscess EXAM: MRI CHEST WITHOUT AND WITH CONTRAST TECHNIQUE: Multiplanar, multisequence MR examination the upper right chest  centered about the right sternoclavicular joint was performed both prior to and following the uncomplicated IV administration gadolinium contrast. CONTRAST:  7.566mGADAVIST GADOBUTROL 1 MMOL/ML IV SOLN COMPARISON:  CT chest angiogram, 02/28/2021 FINDINGS: Limited mediastinum: Unremarkable. Limited lungs and pleura: Unremarkable. Musculoskeletal: Large, rim enhancing fluid collection centered about the right sternoclavicular joint, measuring 5.8 x 4.2 cm. There is no overt bony destruction or bone marrow edema (series 16, image 15). Additional, likely contiguous  air and fluid collection with similar rim enhancement just superior to the sternal notch, incompletely imaged on this examination measuring approximately 6.3 x 4.0 cm (series 16, image 9). IMPRESSION: 1. Large, rim enhancing fluid collection centered about the right sternoclavicular joint, measuring 5.8 x 4.2 cm. 2. Additional, likely contiguous air and fluid collection with similar rim enhancement just superior to the sternal notch, incompletely imaged on this examination measuring approximately 6.3 x 4.0 cm. 3. No overt bony destruction or associated bone marrow edema. 4. Findings are most consistent with septic arthritis. Consider percutaneous fluid sampling. Electronically Signed   By: Delanna Ahmadi M.D.   On: 03/03/2021 15:07   DG Chest Port 1 View  Result Date: 02/28/2021 CLINICAL DATA:  Shortness of breath for 1 week.  Chest pain. EXAM: PORTABLE CHEST 1 VIEW COMPARISON:  CT 09/21/2020 FINDINGS: Shallow inspiration. Mild cardiac enlargement. No vascular congestion, edema, or consolidation. Prominence of the mediastinum is unchanged since prior study and correlates to a combination of prominent mediastinal fat and lymphadenopathy. No airspace disease or consolidation in the lungs. IMPRESSION: Shallow inspiration. Cardiac enlargement. Prominent mediastinal structures correspond to mediastinal fat and lymphadenopathy on previous CT. No active  pulmonary disease. Electronically Signed   By: Lucienne Capers M.D.   On: 02/28/2021 00:38   ECHOCARDIOGRAM COMPLETE  Result Date: 03/03/2021    ECHOCARDIOGRAM REPORT   Patient Name:   Rhonda [name] [facility] Date of Exam: 03/03/2021 Medical Rec #:  [id number]      Height:       63.0 in Accession #:    [id number]     Weight:       178.0 lb Date of Birth:  1960-08-15      BSA:          1.840 m Patient Age:    25 years       BP:           132/70 mmHg Patient Gender: F              HR:           92 bpm. Exam Location:  Inpatient Procedure: 2D Echo, Cardiac Doppler and Color Doppler Indications:    Bacteremia  History:        Patient has prior history of Echocardiogram examinations, most                 recent 01/15/2020. TIA, Signs/Symptoms:Shortness of Breath; Risk                 Factors:Hypertension and Diabetes.  Sonographer:    Wenda Low Referring Phys: [id number] [place]  1. Left ventricular ejection fraction, by estimation, is 50 to 55% with beat to beat variability. The left ventricle has low normal function. Left ventricular endocardial border not optimally defined to evaluate regional wall motion. There is mild left ventricular hypertrophy. Indeterminate diastolic filling due to E-A fusion.  2. Right ventricular systolic function is normal. The right ventricular size is normal. There is normal pulmonary artery systolic pressure. The estimated right ventricular systolic pressure is 44.3 mmHg.  3. A small pericardial effusion is present. The pericardial effusion is posterior to the left ventricle.  4. The mitral valve is normal in structure. Trivial mitral valve regurgitation. No evidence of mitral stenosis.  5. The aortic valve is tricuspid. There is mild calcification of the aortic valve. Aortic valve regurgitation is trivial. No aortic stenosis is present.  6. The inferior vena cava is normal in size with  greater than 50% respiratory variability, suggesting right atrial pressure of 3  mmHg. Conclusion(s)/Recommendation(s): No evidence of valvular vegetations on this transthoracic echocardiogram. Consider a transesophageal echocardiogram to exclude infective endocarditis if clinically indicated. FINDINGS  Left Ventricle: Left ventricular ejection fraction, by estimation, is 50 to 55%. The left ventricle has low normal function. Left ventricular endocardial border not optimally defined to evaluate regional wall motion. The left ventricular internal cavity  size was normal in size. There is mild left ventricular hypertrophy. Indeterminate diastolic filling due to E-A fusion. Right Ventricle: The right ventricular size is normal. No increase in right ventricular wall thickness. Right ventricular systolic function is normal. There is normal pulmonary artery systolic pressure. The tricuspid regurgitant velocity is 2.78 m/s, and  with an assumed right atrial pressure of 3 mmHg, the estimated right ventricular systolic pressure is 08.6 mmHg. Left Atrium: Left atrial size was normal in size. Right Atrium: Right atrial size was normal in size. Pericardium: A small pericardial effusion is present. The pericardial effusion is posterior to the left ventricle. Presence of pericardial fat pad. Mitral Valve: The mitral valve is normal in structure. Trivial mitral valve regurgitation. No evidence of mitral valve stenosis. MV peak gradient, 4.9 mmHg. The mean mitral valve gradient is 1.0 mmHg. Tricuspid Valve: The tricuspid valve is normal in structure. Tricuspid valve regurgitation is mild . No evidence of tricuspid stenosis. Aortic Valve: The aortic valve is tricuspid. There is mild calcification of the aortic valve. Aortic valve regurgitation is trivial. No aortic stenosis is present. Aortic valve mean gradient measures 5.7 mmHg. Aortic valve peak gradient measures 12.0 mmHg. Aortic valve area, by VTI measures 1.80 cm. Pulmonic Valve: The pulmonic valve was normal in structure. Pulmonic valve regurgitation is  trivial. No evidence of pulmonic stenosis. Aorta: The aortic root is normal in size and structure. Venous: The inferior vena cava is normal in size with greater than 50% respiratory variability, suggesting right atrial pressure of 3 mmHg. IAS/Shunts: The interatrial septum was not well visualized.  LEFT VENTRICLE PLAX 2D LVIDd:         3.90 cm   Diastology LVIDs:         2.50 cm   LV e' medial:    5.22 cm/s LV PW:         1.20 cm   LV E/e' medial:  10.9 LV IVS:        1.10 cm   LV e' lateral:   3.81 cm/s LVOT diam:     2.00 cm   LV E/e' lateral: 15.0 LV SV:         49 LV SV Index:   26 LVOT Area:     3.14 cm  RIGHT VENTRICLE RV Basal diam:  2.75 cm RV Mid diam:    2.20 cm LEFT ATRIUM             Index        RIGHT ATRIUM           Index LA diam:        2.60 cm 1.41 cm/m   RA Area:     11.10 cm LA Vol (A2C):   35.9 ml 19.51 ml/m  RA Volume:   22.50 ml  12.23 ml/m LA Vol (A4C):   47.9 ml 26.03 ml/m LA Biplane Vol: 43.8 ml 23.80 ml/m  AORTIC VALVE                     PULMONIC VALVE AV Area (Vmax):  1.92 cm      PV Vmax:       0.76 m/s AV Area (Vmean):   1.81 cm      PV Peak grad:  2.3 mmHg AV Area (VTI):     1.80 cm AV Vmax:           173.55 cm/s AV Vmean:          109.859 cm/s AV VTI:            0.271 m AV Peak Grad:      12.0 mmHg AV Mean Grad:      5.7 mmHg LVOT Vmax:         106.00 cm/s LVOT Vmean:        63.300 cm/s LVOT VTI:          0.155 m LVOT/AV VTI ratio: 0.57  AORTA Ao Root diam: 3.00 cm MITRAL VALVE                TRICUSPID VALVE MV Area (PHT): 4.44 cm     TR Peak grad:   30.9 mmHg MV Area VTI:   1.80 cm     TR Vmax:        278.00 cm/s MV Peak grad:  4.9 mmHg MV Mean grad:  1.0 mmHg     SHUNTS MV Vmax:       1.11 m/s     Systemic VTI:  0.16 m MV Vmean:      53.6 cm/s    Systemic Diam: 2.00 cm MV Decel Time: 171 msec MV E velocity: 57.00 cm/s MV A velocity: 111.00 cm/s MV E/A ratio:  0.51 Cherlynn Kaiser MD Electronically signed by Cherlynn Kaiser MD Signature Date/Time: 03/03/2021/1:25:09 PM     Final     Labs:  CBC: Recent Labs    02/28/21 0018 03/01/21 0504 03/02/21 0411 03/04/21 0415  WBC 13.1* 13.5* 14.2* 6.7  HGB 10.8* 12.2 11.3* 9.3*  HCT 35.1* 39.6 36.6 29.4*  PLT 56* 40* 30* 6*    COAGS: Recent Labs    07/23/20 0347 08/29/20 2003 11/22/20 0403 03/04/21 0415  INR 1.0 1.1 1.2 1.2  APTT  --   --  34  --     BMP: Recent Labs    02/28/21 0018 03/01/21 0504 03/02/21 0311 03/04/21 0415  NA 127* 131* 129* 137  K 2.5* 4.2 4.8 2.4*  CL 88* 90* 94* 101  CO2 29 30 19* 28  GLUCOSE 372* 120* 98 113*  BUN _0 CALCIUM 8.1* 9.1 8.6* 7.7*  CREATININE 0.50 0.52 0.78 0.70  GFRNONAA >60 >60 >60 >60    LIVER FUNCTION TESTS: Recent Labs    11/25/20 0026 11/26/20 0124 12/27/20 0926 02/16/21 2031  BILITOT 2.3* 1.9* 1.0 0.6  AST 196* 162* 37 57*  ALT 143* 129* 73* 60*  ALKPHOS 578* 558* 422* 337*  PROT 4.2* 4.4* 4.7* 4.7*  ALBUMIN 1.9* 2.0* 2.6* 1.7*    TUMOR MARKERS: No results for input(s): AFPTM, CEA, CA199, CHROMGRNA in the last 8760 hours.  Assessment and Plan:  Neck/chest wall abscess Scheduled for abscess aspiration in IR Pts husband is aware of procedure benefits and risks including but not limited to Infection, bleeding, damage to surrounding structures. He is agreeable- and has signed consent  Thank you for this interesting consult.  I greatly enjoyed meeting JASPER RUMINSKI and look forward to participating in their care.  A copy of this report was sent to the requesting provider on  this date.  Electronically Signed: Lavonia Drafts, PA-C 03/04/2021, 10:04 AM   I spent a total of 20 Minutes    in face to face in clinical consultation, greater than 50% of which was counseling/coordinating care for neck/chest wall abscess aspiration

## 2021-03-04 NOTE — Progress Notes (Signed)
No acute surgical needs for wound.  Continue conservative therapy.  Can reconsult if surgical needs arise.  We will sign off.  Rhonda [name] 7:24 AM 03/04/2021

## 2021-03-04 NOTE — Progress Notes (Signed)
Palliative Medicine Inpatient Follow Up Note  Consulting Provider: Karmen Bongo, MD   Reason for consult:   [place] [facility] Consult  Reason for Consult? goals of care    HPI:  Per intake H&P --> Rhonda [name] is a 60 y.o. female with medical history significant of HTN; OSA not on CPAP; prediabetes; neurosarcoidosis; colostomy; and seizures presenting with SOB.  She has been having trouble breathing since last night and complaining of chest pain.  Symptoms started yesterday AM and progressed all day and night.  Appears to be epigastric when she points to it.  +cough, nonproductive.  She does throat clearing after drinking water.  No change in LE edema. No fever.  No sick contacts.  The pain is currently resolved.  She has a sacral pressure ulcer that her husband treats.   Palliative care has been asked to get involved to further address goals of care.  Today's Discussion (03/04/2021):  *Please note that this is a verbal dictation therefore any spelling or grammatical errors are due to the "[facility]" system interpretation.  Chart reviewed.  I met at bedside with Rhonda [name], she remains to come in and out of complete awareness though if responsive. I spoke at bedside to Rhonda [name]'s spouse, Rhonda [name]. We reviewed that she is extremely ill presently and I am concerned that her condition could very well continue to deteriorate.   A review of Rhonda [name]'s present ailments was held. We discussed her severe sepsis, chest wall abscess, thombocytopenia, Stg IV pressure ulcer, ongoing, encephalopathy. Rhonda [name] states awareness as to how ill Rhonda [name] is. He shares that he spoke to the IR team this morning to complete an abscess drainage. He is hopeful this will help her but realizes she may be too far into her illness for a recovery benefit to be seen.  I shared that if in the next day or so Ethel neglects to improve we will need to further discuss comfort care. He shares  that he knows and understands what this is and declines when I asked if I may explain it to him in greater detail. Rhonda [name] states "I know". He shares a strong faith in God and feels God will guide Rhonda [name] the direction she is meant to go.   Questions and concerns addressed /palliative support was provided.  Objective Assessment: Vital Signs Vitals:   03/03/21 2213 03/03/21 2336  BP: (!) 126/91 (!) 122/93  Pulse: (!) 103 95  Resp: 15   Temp: 97.9 F (36.6 C) 98 F (36.7 C)  SpO2: 99% 100%    Intake/Output Summary (Last 24 hours) at 03/04/2021 1143 Last data filed at 03/03/2021 1518 Gross per 24 hour  Intake 1048.21 ml  Output --  Net 1048.21 ml    Last Weight  Most recent update: 02/27/2021 11:54 PM    Weight  80.7 kg (178 lb)            Gen: Very ill-appearing older African-American female HEENT: Dry, white patches present on tongue CV: Regular rate and rhythm PULM: 2LPM [place] ABD: Colostomy pouch in left lower quadrant, mid abdominal dressing in place EXT: Right foot pedal edema Neuro: Alert and oriented x2  SUMMARY OF RECOMMENDATIONS   DNAR/DNI  Ongoing daily conversations with patients spouse  Watchful waiting for improvements in the oncoming day, if patient continues to worsen or neglects to improve it would be prudent to change our focus to comfort. --> Patients husband has been updated on this though he is reasonably  having a hard time digesting this information.   The palliative medicine team will continue to follow along with [facility] during hospitalization   Time Spent: 35 Greater than 50% of the time was spent in counseling and coordination of care ______________________________________________________________________________________ [place] Team Team Cell Phone: [phone] Please utilize secure chat with additional questions, if there is no response within 30 minutes please call the above phone number  Palliative  Medicine Team providers are available by phone from 7am to 7pm daily and can be reached through the team cell phone.  Should this patient require assistance outside of these hours, please call the patient's attending physician.

## 2021-03-04 NOTE — Consult Note (Addendum)
[place]  [phone] [phone]   I saw the patient, briefly examined her and collaborated history with her husband.  The patient is not engaging in conversation  [place]  Referring MD: Dr. Eleonore Chiquito   Reason for Referral: Thrombocytopenia  HPI: Rhonda [name] is a 60 year old female with a past medical history significant for hypertension, obstructive sleep apnea, prediabetes, neurosarcoidosis, colostomy.  She presented to the emergency department with shortness of breath.  She had been having difficulty breathing since the night prior to admission and also complained of chest pain.  Of note, she was hospitalized in May 2022 with a perforated sigmoid colon which led to an emergency ex lap with Hartman's procedure.  She had delayed wound healing with wound VAC placement and her abdominal wound has slowly improved with time.  CBC on admission (02/28/2021) showed WBC of 13.1, hemoglobin 10.8, platelet count 56,000.  A CBC performed almost 2 weeks prior on 02/16/2021 showed a WBC of 14.0, hemoglobin 11.4, and platelet count of 190,000.  Review of prior CBCs available to me in epic do show intermittent thrombocytopenia dating back to April 2022.  However, her lowest platelet count prior to this admission was 63,000 on 09/11/2020.  Platelet count this morning is down to 6000.  The patient has severe sepsis with MRSA bacteremia.  She is receiving IV antibiotics with IV vancomycin and Zosyn since admission.  She has a possible chest wall abscess.  She has been seen by cardiothoracic surgery who recommends an IR guided drain placement for this abscess.  This is scheduled for today.  The patient was lying in bed and minimally interactive.  Her husband and other family numbers were at the bedside.  He has been did confirm for me that the patient does not have seizures and is not on any medications for seizures.  No bleeding has been noted but she  bruises very easily.  She has not been having any fevers or chills.  She has an open area on her sacral area that is painful.  She has an abdominal wound that is slowly healing.  She is also complaining of some mild chest discomfort today.  Hematology was asked to see the patient for recommendations regarding her thrombocytopenia.  Past Medical History:  Diagnosis Date   Arthritis    Colostomy in place [facility])    ?ischemic bowel   Degenerative disc disease, lumbar    DNR (do not resuscitate) 02/28/2021   GERD (gastroesophageal reflux disease)    Hypertension    OSA (obstructive sleep apnea)    not currently on CPAP   Prediabetes    Sarcoidosis of central nervous system    Seizures (HCC)    Type 2    Urinary incontinence   :     Past Surgical History:  Procedure Laterality Date   ABDOMINAL HYSTERECTOMY     BOWEL RESECTION N/A 09/01/2020   Procedure: SMALL BOWEL RESECTION;  Surgeon: Georganna Skeans, MD;  Location: [place];  Service: General;  Laterality: N/A;   BREAST EXCISIONAL BIOPSY Right    BREAST SURGERY     right breast   CESAREAN SECTION     COLONOSCOPY WITH PROPOFOL N/A 07/26/2020   Procedure: COLONOSCOPY WITH PROPOFOL;  Surgeon: Carol Ada, MD;  Location: [place];  Service: Endoscopy;  Laterality: N/A;   ESOPHAGOGASTRODUODENOSCOPY (EGD) WITH PROPOFOL N/A 07/24/2020   Procedure: ESOPHAGOGASTRODUODENOSCOPY (EGD) WITH PROPOFOL;  Surgeon: Carol Ada, MD;  Location: [place];  Service: Endoscopy;  Laterality: N/A;  Bleeding with anemia-5 gms drop in hemoglobin   GIVENS CAPSULE STUDY N/A 08/21/2020   Procedure: GIVENS CAPSULE STUDY;  Surgeon: Juanita Craver, MD;  Location: [facility] ENDOSCOPY;  Service: Endoscopy;  Laterality: N/A;   IR ANGIOGRAM FOLLOW UP STUDY  08/29/2020   IR ANGIOGRAM SELECTIVE EACH ADDITIONAL VESSEL  08/29/2020   IR ANGIOGRAM VISCERAL SELECTIVE  08/29/2020   IR ANGIOGRAM VISCERAL SELECTIVE  08/29/2020   IR EMBO ART  VEN HEMORR LYMPH EXTRAV  INC GUIDE  ROADMAPPING  08/29/2020   IR US GUIDE VASC ACCESS RIGHT  08/29/2020   LAPAROTOMY N/A 09/01/2020   Procedure: EXPLORATORY LAPAROTOMY;  Surgeon: Georganna Skeans, MD;  Location: [place];  Service: General;  Laterality: N/A;  :   CURRENT MEDS: Current Facility-Administered Medications  Medication Dose Route Frequency Provider Last Rate Last Admin   acetaminophen (TYLENOL) tablet 650 mg  650 mg Oral Q6H PRN Karmen Bongo, MD       Or   acetaminophen (TYLENOL) suppository 650 mg  650 mg Rectal Q6H PRN Karmen Bongo, MD       albuterol (PROVENTIL) (2.5 MG/3ML) 0.083% nebulizer solution 2.5 mg  2.5 mg Nebulization Q2H PRN Karmen Bongo, MD       Ampicillin-Sulbactam (UNASYN) 3 g in sodium chloride 0.9 % 100 mL IVPB  3 g Intravenous Q6H Lyndee Leo, RPH 200 mL/hr at 03/04/21 0617 3 g at 03/04/21 0617   atorvastatin (LIPITOR) tablet 80 mg  80 mg Oral q1800 Karmen Bongo, MD   80 mg at 03/03/21 1618   chlorhexidine (PERIDEX) 0.12 % solution 15 mL  15 mL Mouth Rinse BID Karmen Bongo, MD   15 mL at 03/03/21 1046   collagenase (SANTYL) ointment   Topical BID Norm Parcel, PA-C   Given at 03/03/21 1046   dextrose 5 %-0.45 % sodium chloride infusion   Intravenous Continuous Oswald Hillock, MD 75 mL/hr at 03/04/21 0332 New Bag at 03/04/21 0332   feeding supplement (GLUCERNA SHAKE) (GLUCERNA SHAKE) liquid 237 mL  237 mL Oral BID BM Karmen Bongo, MD   237 mL at 03/01/21 1301   ferrous sulfate tablet 325 mg  325 mg Oral Q breakfast Karmen Bongo, MD   325 mg at 02/28/21 1107   gabapentin (NEURONTIN) capsule 100 mg  100 mg Oral TID Karmen Bongo, MD   100 mg at 03/03/21 1618   hydrALAZINE (APRESOLINE) injection 5 mg  5 mg Intravenous Q4H PRN Karmen Bongo, MD       HYDROcodone-acetaminophen (NORCO/VICODIN) 5-325 MG per tablet 1-2 tablet  1-2 tablet Oral Q4H PRN Karmen Bongo, MD   2 tablet at 03/03/21 1330   hydrocortisone sodium succinate (SOLU-CORTEF) 100 MG injection 100 mg  100 mg  Intravenous Q8H Oswald Hillock, MD   100 mg at 03/04/21 6222   insulin aspart (novoLOG) injection 0-15 Units  0-15 Units Subcutaneous TID [facility] Karmen Bongo, MD   2 Units at 03/04/21 0813   insulin aspart (novoLOG) injection 0-5 Units  0-5 Units Subcutaneous QHS Karmen Bongo, MD   2 Units at 02/28/21 2155   insulin glargine-yfgn (SEMGLEE) injection 10 Units  10 Units Subcutaneous QHS Oswald Hillock, MD   10 Units at 03/03/21 2229   MEDLINE mouth rinse  15 mL Mouth Rinse q12n4p Karmen Bongo, MD   15 mL at 03/03/21 1607   melatonin tablet 10 mg  10 mg Oral Ivery Quale, MD   10 mg at 02/28/21 2147   morphine 2  MG/ML injection 2 mg  2 mg Intravenous Q2H PRN Karmen Bongo, MD   2 mg at 03/01/21 0749   multivitamin with minerals tablet 1 tablet  1 tablet Oral Daily Karmen Bongo, MD   1 tablet at 02/28/21 1843   nystatin (MYCOSTATIN) 100000 UNIT/ML suspension 500,000 Units  5 mL Oral QID Karmen Bongo, MD   500,000 Units at 03/03/21 1621   ondansetron [facility]) tablet 4 mg  4 mg Oral Q6H PRN Karmen Bongo, MD       Or   ondansetron [facility]) injection 4 mg  4 mg Intravenous Q6H PRN Karmen Bongo, MD   4 mg at 03/01/21 1241   pantoprazole (PROTONIX) EC tablet 40 mg  40 mg Oral Daily Karmen Bongo, MD   40 mg at 02/28/21 1107   polyvinyl alcohol (LIQUIFILM TEARS) 1.4 % ophthalmic solution 1 drop  1 drop Both Eyes Daily PRN Karmen Bongo, MD       potassium chloride 10 mEq in 100 mL IVPB  10 mEq Intravenous Q1 Hr x 5 Lama, Gagan S, MD 100 mL/hr at 03/04/21 0816 10 mEq at 03/04/21 0816   silver nitrate applicators applicator 1 application  1 application Topical Once Norm Parcel, PA-C       sodium chloride flush (NS) 0.9 % injection 3 mL  3 mL Intravenous Lillia Mountain, MD   3 mL at 03/03/21 2232   vancomycin (VANCOCIN) IVPB 1000 mg/200 mL premix  1,000 mg Intravenous Q24H Hammons, Kimberly B, RPH 200 mL/hr at 03/03/21 1518 Infusion Verify at 03/03/21 1518   Allergies   Allergen Reactions   Aspirin Swelling   Augmentin [Amoxicillin-Pot Clavulanate] Swelling and Other (See Comments)    **patient has tolerated piperacillin/tazobactam, cephalexin, and ceftriaxone Hand swelled at IV site - likely extravasation  :   Family History  Problem Relation Age of Onset   Cancer Mother    Hypertension Sister    Diabetes Sister    Diabetes Maternal Grandmother   :   Social History   Socioeconomic History   Marital status: Married    Spouse name: Not on file   Number of children: 4   Years of education: 12   Highest education level: Not on file  Occupational History   Occupation: disabled  Tobacco Use   Smoking status: Never   Smokeless tobacco: Never  Vaping Use   Vaping Use: Never used  Substance and Sexual Activity   Alcohol use: No   Drug use: No   Sexual activity: Yes    Birth control/protection: None  Other Topics Concern   Not on file  Social History Narrative   Right handed   Tea sometimes, hot chocolate    Social Determinants of Health   Financial Resource Strain: Not on file  Food Insecurity: Not on file  Transportation Needs: Not on file  Physical Activity: Not on file  Stress: Not on file  Social Connections: Not on file  Intimate Partner Violence: Not on file  :  REVIEW OF SYSTEMS: As noted in HPI, unable to obtain a comprehensive review of systems.  Exam: Patient Vitals for the past 24 hrs:  BP Temp Temp src Pulse Resp SpO2  03/03/21 2336 (!) 122/93 98 F (36.7 C) Oral 95 -- 100 %  03/03/21 2213 (!) 126/91 97.9 F (36.6 C) Axillary (!) 103 15 99 %  03/03/21 1905 -- -- -- -- -- 99 %    General: Resting quietly, no distress.  She has oxygen delivered  via nasal cannula with dried blood noted on the nasal passages Eyes:  no scleral icterus.   ENT:  There were no oropharyngeal lesions.   Lymphatics:  Negative cervical, supraclavicular or axillary adenopathy.   Respiratory: Lungs are clear Cardiovascular: Regular  rate, with occasional missed beats, no lower extremity edema GI: Positive bowel sounds, soft, dressing to abdomen is clean, dry, intact.   Skin: Multiple ecchymoses particularly over her bilateral arms, she has petechiae on her arms as well.  I did not visualize her sacral wound-see photos from prior notes. Neuro: Lethargic, but does open eyes and answers yes/no questions.  Oriented to person.  LABS:  Lab Results  Component Value Date   WBC 6.7 03/04/2021   HGB 9.3 (L) 03/04/2021   HCT 29.4 (L) 03/04/2021   PLT 6 (LL) 03/04/2021   GLUCOSE 113 (H) 03/04/2021   CHOL 248 (H) 05/24/2019   TRIG 72 09/24/2020   HDL 35 (L) 05/24/2019   LDLCALC 179 (H) 05/24/2019   ALT 60 (H) 02/16/2021   AST 57 (H) 02/16/2021   NA 137 03/04/2021   K 2.4 (LL) 03/04/2021   CL 101 03/04/2021   CREATININE 0.70 03/04/2021   BUN 13 03/04/2021   CO2 28 03/04/2021   INR 1.2 03/04/2021   HGBA1C 7.8 (H) 11/22/2020   I have reviewed her peripheral blood smear Absolute thrombocytopenia is noted.  Occasional rare schistocytes.  No platelet clumping.  Anisocytosis noted  I have personally reviewed her CT imaging of her pelvis, chest CT angiogram as well CT Angio Chest PE W and/or Wo Contrast  Result Date: 02/28/2021 CLINICAL DATA:  Shortness of breath. EXAM: CT ANGIOGRAPHY CHEST WITH CONTRAST TECHNIQUE: Multidetector CT imaging of the chest was performed using the standard protocol during bolus administration of intravenous contrast. Multiplanar CT image reconstructions and MIPs were obtained to evaluate the vascular anatomy. CONTRAST:  1mL OMNIPAQUE IOHEXOL 350 MG/ML SOLN COMPARISON:  CT scan 09/21/2020 FINDINGS: Cardiovascular: The heart is mildly enlarged but stable. No pericardial effusion. Stable tortuosity and mild ectasia of the thoracic aorta but no aneurysm or dissection. Minimal atherosclerotic calcification at the aortic arch. The branch vessels are patent. No definite coronary artery calcifications. The  pulmonary arterial tree is well opacified. Small bandlike filling defect noted in the proximal right upper lobe pulmonary artery in an area of a prior pulmonary embolus consistent with a fibrotic band. No findings suspicious for acute PE. Mediastinum/Nodes: There is a stable fluid collection noted near the left atrium and inferior pulmonary vein on the right side. This may be some type of epicardial cyst. No mediastinal or hilar adenopathy. The esophagus is grossly. Lungs/Pleura: Chronic appearing patchy mosaic pattern of ground-glass attenuation could be asymmetric pulmonary edema, small airways disease such as asthma or respiratory bronchiolitis. Other possibilities would include cryptogenic organizing pneumonia or hypersensitivity pneumonitis. There are also small bilateral pleural effusions and overlying atelectasis. No worrisome pulmonary lesions. Upper Abdomen: No significant upper abdominal findings. Musculoskeletal: No breast masses are identified. There is a complex appearing fluid collection containing gas involving the right chest wall anteriorly. The upper part of this abscess is anterior to the thyroid gland and it appears it may communicate with a second lower abscess near the right sternoclavicular joint and anterior to the right clavicular head. Do not see any definite CT findings for septic arthritis at the right [place] joint that would be a possibility. This measures a maximum of 7.5 cm. Aspiration may be helpful for diagnostic and therapeutic purposes. MRI  examination of the chest wall may be helpful for further evaluation. Review of the MIP images confirms the above findings. IMPRESSION: 1. Small bandlike filling defect in the proximal right upper lobe pulmonary artery in an area of a prior pulmonary embolus consistent with a fibrotic band. No findings suspicious for acute PE. 2. Stable tortuosity and mild ectasia of the thoracic aorta but no aneurysm or dissection. 3. Chronic appearing patchy mosaic  pattern of ground-glass attenuation could be asymmetric pulmonary edema, small airways disease such as asthma or respiratory bronchiolitis. Other possibilities would include cryptogenic organizing pneumonia or hypersensitivity pneumonitis. 4. Small bilateral pleural effusions and overlying atelectasis. 5. 7.5 cm anterior chest wall abscess of uncertain origin. Right sternoclavicular joint septic arthritis would be a possibility although I do not see any definite CT evidence of this. MRI may be helpful for further evaluation. Electronically Signed   By: Marijo Sanes M.D.   On: 02/28/2021 05:47   CT PELVIS W CONTRAST  Result Date: 02/17/2021 CLINICAL DATA:  Sacral wound with increased bleeding. Evaluate wound depth and for osteomyelitis EXAM: CT PELVIS WITH CONTRAST TECHNIQUE: Multidetector CT imaging of the pelvis was performed using the standard protocol following the bolus administration of intravenous contrast. CONTRAST:  163mL OMNIPAQUE IOHEXOL 350 MG/ML SOLN COMPARISON:  11/22/2020 FINDINGS: Urinary Tract:  Unremarkable Bowel: Descending colostomy. No bowel obstruction or visible inflammation where covered Vascular/Lymphatic: Mild atheromatous calcification. No acute vascular finding or adenopathy. Reproductive:  Hysterectomy.  Negative adnexa Other:  No pelvic ascites. Musculoskeletal: Right para median to midline sacral decubitus ulcer deepest at the inferior extent where there is either internal debris or packing extending towards the anorectal junction with regional inflammation. From this point there is superiorly migrating soft tissue emphysema tracking upward along the posterior coccyx. No visible osteomyelitis or fracture. No rim enhancing fluid collection. A hematoma posterior to the left hip has organized and become low-density, 9 x 3.4 cm on axial slices. Generalized muscular atrophy. Severe lower lumbar spine degeneration where covered. IMPRESSION: 1. Sacral decubitus ulcer with regional  cellulitis and soft tissue emphysema, as above. No osteomyelitis or drainable fluid collection. 2. Expected evolution of hematoma posterior to the left hip. Electronically Signed   By: Jorje Guild M.D.   On: 02/17/2021 09:32   MR CHEST W WO CONTRAST  Result Date: 03/03/2021 CLINICAL DATA:  Evaluate abscess EXAM: MRI CHEST WITHOUT AND WITH CONTRAST TECHNIQUE: Multiplanar, multisequence MR examination the upper right chest centered about the right sternoclavicular joint was performed both prior to and following the uncomplicated IV administration gadolinium contrast. CONTRAST:  7.77mL GADAVIST GADOBUTROL 1 MMOL/ML IV SOLN COMPARISON:  CT chest angiogram, 02/28/2021 FINDINGS: Limited mediastinum: Unremarkable. Limited lungs and pleura: Unremarkable. Musculoskeletal: Large, rim enhancing fluid collection centered about the right sternoclavicular joint, measuring 5.8 x 4.2 cm. There is no overt bony destruction or bone marrow edema (series 16, image 15). Additional, likely contiguous air and fluid collection with similar rim enhancement just superior to the sternal notch, incompletely imaged on this examination measuring approximately 6.3 x 4.0 cm (series 16, image 9). IMPRESSION: 1. Large, rim enhancing fluid collection centered about the right sternoclavicular joint, measuring 5.8 x 4.2 cm. 2. Additional, likely contiguous air and fluid collection with similar rim enhancement just superior to the sternal notch, incompletely imaged on this examination measuring approximately 6.3 x 4.0 cm. 3. No overt bony destruction or associated bone marrow edema. 4. Findings are most consistent with septic arthritis. Consider percutaneous fluid sampling. Electronically Signed   By:  Delanna Ahmadi M.D.   On: 03/03/2021 15:07   DG Chest Port 1 View  Result Date: 02/28/2021 CLINICAL DATA:  Shortness of breath for 1 week.  Chest pain. EXAM: PORTABLE CHEST 1 VIEW COMPARISON:  CT 09/21/2020 FINDINGS: Shallow inspiration. Mild  cardiac enlargement. No vascular congestion, edema, or consolidation. Prominence of the mediastinum is unchanged since prior study and correlates to a combination of prominent mediastinal fat and lymphadenopathy. No airspace disease or consolidation in the lungs. IMPRESSION: Shallow inspiration. Cardiac enlargement. Prominent mediastinal structures correspond to mediastinal fat and lymphadenopathy on previous CT. No active pulmonary disease. Electronically Signed   By: Lucienne Capers M.D.   On: 02/28/2021 00:38   ECHOCARDIOGRAM COMPLETE  Result Date: 03/03/2021    ECHOCARDIOGRAM REPORT   Patient Name:   ASTA CORBRIDGE [facility] Date of Exam: 03/03/2021 Medical Rec #:  [id number]      Height:       63.0 in Accession #:    [id number]     Weight:       178.0 lb Date of Birth:  Jun 15, 1960      BSA:          1.840 m Patient Age:    87 years       BP:           132/70 mmHg Patient Gender: F              HR:           92 bpm. Exam Location:  Inpatient Procedure: 2D Echo, Cardiac Doppler and Color Doppler Indications:    Bacteremia  History:        Patient has prior history of Echocardiogram examinations, most                 recent 01/15/2020. TIA, Signs/Symptoms:Shortness of Breath; Risk                 Factors:Hypertension and Diabetes.  Sonographer:    Wenda Low Referring Phys: [id number] [place]  1. Left ventricular ejection fraction, by estimation, is 50 to 55% with beat to beat variability. The left ventricle has low normal function. Left ventricular endocardial border not optimally defined to evaluate regional wall motion. There is mild left ventricular hypertrophy. Indeterminate diastolic filling due to E-A fusion.  2. Right ventricular systolic function is normal. The right ventricular size is normal. There is normal pulmonary artery systolic pressure. The estimated right ventricular systolic pressure is 63.7 mmHg.  3. A small pericardial effusion is present. The pericardial effusion is  posterior to the left ventricle.  4. The mitral valve is normal in structure. Trivial mitral valve regurgitation. No evidence of mitral stenosis.  5. The aortic valve is tricuspid. There is mild calcification of the aortic valve. Aortic valve regurgitation is trivial. No aortic stenosis is present.  6. The inferior vena cava is normal in size with greater than 50% respiratory variability, suggesting right atrial pressure of 3 mmHg. Conclusion(s)/Recommendation(s): No evidence of valvular vegetations on this transthoracic echocardiogram. Consider a transesophageal echocardiogram to exclude infective endocarditis if clinically indicated. FINDINGS  Left Ventricle: Left ventricular ejection fraction, by estimation, is 50 to 55%. The left ventricle has low normal function. Left ventricular endocardial border not optimally defined to evaluate regional wall motion. The left ventricular internal cavity  size was normal in size. There is mild left ventricular hypertrophy. Indeterminate diastolic filling due to E-A fusion. Right Ventricle: The right ventricular size is normal. No increase in right  ventricular wall thickness. Right ventricular systolic function is normal. There is normal pulmonary artery systolic pressure. The tricuspid regurgitant velocity is 2.78 m/s, and  with an assumed right atrial pressure of 3 mmHg, the estimated right ventricular systolic pressure is 08.6 mmHg. Left Atrium: Left atrial size was normal in size. Right Atrium: Right atrial size was normal in size. Pericardium: A small pericardial effusion is present. The pericardial effusion is posterior to the left ventricle. Presence of pericardial fat pad. Mitral Valve: The mitral valve is normal in structure. Trivial mitral valve regurgitation. No evidence of mitral valve stenosis. MV peak gradient, 4.9 mmHg. The mean mitral valve gradient is 1.0 mmHg. Tricuspid Valve: The tricuspid valve is normal in structure. Tricuspid valve regurgitation is mild .  No evidence of tricuspid stenosis. Aortic Valve: The aortic valve is tricuspid. There is mild calcification of the aortic valve. Aortic valve regurgitation is trivial. No aortic stenosis is present. Aortic valve mean gradient measures 5.7 mmHg. Aortic valve peak gradient measures 12.0 mmHg. Aortic valve area, by VTI measures 1.80 cm. Pulmonic Valve: The pulmonic valve was normal in structure. Pulmonic valve regurgitation is trivial. No evidence of pulmonic stenosis. Aorta: The aortic root is normal in size and structure. Venous: The inferior vena cava is normal in size with greater than 50% respiratory variability, suggesting right atrial pressure of 3 mmHg. IAS/Shunts: The interatrial septum was not well visualized.  LEFT VENTRICLE PLAX 2D LVIDd:         3.90 cm   Diastology LVIDs:         2.50 cm   LV e' medial:    5.22 cm/s LV PW:         1.20 cm   LV E/e' medial:  10.9 LV IVS:        1.10 cm   LV e' lateral:   3.81 cm/s LVOT diam:     2.00 cm   LV E/e' lateral: 15.0 LV SV:         49 LV SV Index:   26 LVOT Area:     3.14 cm  RIGHT VENTRICLE RV Basal diam:  2.75 cm RV Mid diam:    2.20 cm LEFT ATRIUM             Index        RIGHT ATRIUM           Index LA diam:        2.60 cm 1.41 cm/m   RA Area:     11.10 cm LA Vol (A2C):   35.9 ml 19.51 ml/m  RA Volume:   22.50 ml  12.23 ml/m LA Vol (A4C):   47.9 ml 26.03 ml/m LA Biplane Vol: 43.8 ml 23.80 ml/m  AORTIC VALVE                     PULMONIC VALVE AV Area (Vmax):    1.92 cm      PV Vmax:       0.76 m/s AV Area (Vmean):   1.81 cm      PV Peak grad:  2.3 mmHg AV Area (VTI):     1.80 cm AV Vmax:           173.55 cm/s AV Vmean:          109.859 cm/s AV VTI:            0.271 m AV Peak Grad:      12.0 mmHg AV Mean Grad:  5.7 mmHg LVOT Vmax:         106.00 cm/s LVOT Vmean:        63.300 cm/s LVOT VTI:          0.155 m LVOT/AV VTI ratio: 0.57  AORTA Ao Root diam: 3.00 cm MITRAL VALVE                TRICUSPID VALVE MV Area (PHT): 4.44 cm     TR Peak grad:    30.9 mmHg MV Area VTI:   1.80 cm     TR Vmax:        278.00 cm/s MV Peak grad:  4.9 mmHg MV Mean grad:  1.0 mmHg     SHUNTS MV Vmax:       1.11 m/s     Systemic VTI:  0.16 m MV Vmean:      53.6 cm/s    Systemic Diam: 2.00 cm MV Decel Time: 171 msec MV E velocity: 57.00 cm/s MV A velocity: 111.00 cm/s MV E/A ratio:  0.51 Cherlynn Kaiser MD Electronically signed by Cherlynn Kaiser MD Signature Date/Time: 03/03/2021/1:25:09 PM    Final      ASSESSMENT AND PLAN:   Thrombocytopenia Review of patient's lab work shows intermittent thrombocytopenia for the past 6 months The cause of her thrombocytopenia is due to sepsis, broad-spectrum IV antibiotics use, consumption as well as bone marrow suppression overall. Platelet count normal about 2 weeks ago and now with severe thrombocytopenia Not actively bleeding but due to have aspiration of her chest wall abscess Recommend 2 units of platelets prior to procedure and another unit postprocedure Her thrombocytopenia will recover slowly; consider changing IV antibiotics whenever is possible For now, I recommend platelet transfusion if platelet count is less than 10,000 or active bleeding  Severe sepsis/MRSA bacteremia ID following Repeat blood cultures sent 10/16 are negative to date IV antibiotics per ID/hospitalist  Chest wall abscess Evaluated by IR who is planning for aspiration of abscess Transfuse platelets as noted above  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 Status postdebridement by general surgery CT pelvis showed regional cellulitis and soft tissue emphysema Wound care following  History of pulmonary embolism The patient was previously on Eliquis but this has been placed on hold due to thrombocytopenia Recommend that this continue to be held until platelet count is consistently 50,000 or greater  Type 2 diabetes mellitus Management per hospitalist  Neurosarcoidosis The patient is on steroids per  hospitalist Palliative care consultation has been recommended  Hypertension Management per hospitalist  Obstructive sleep apnea Management per hospitalist  Plan of care is briefly discussed with her husband at the bedside.  I will return to check on her tomorrow Thank you for this referral.  Mikey Bussing, DNP, AGPCNP-BC, AOCNP Heath Lark, MD

## 2021-03-04 NOTE — Consult Note (Signed)
Reason for Consult: Chest and neck abscess Referring Physician: Oswald Hillock, MD  HPI:  Rhonda [name] is an 60 y.o. female who was admitted on 02/28/21 for treatment of her shortness of breath. She was found to have bacteremia and a large anterior chest wall abscess, centered around the right sternoclavicular joint. Her MRI findings were consistent with septic arthritis. Her chest abscess appears to continue superiorly into the lower neck. Her MRI did not show any compression of her trachea or airway.  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   Degenerative disc disease, lumbar    DNR (do not resuscitate) 02/28/2021   GERD (gastroesophageal reflux disease)    Hypertension    OSA (obstructive sleep apnea)    not currently on CPAP   Prediabetes    Sarcoidosis of central nervous system    Seizures (HCC)    Type 2    Urinary incontinence     Past Surgical History:  Procedure Laterality Date   ABDOMINAL HYSTERECTOMY     BOWEL RESECTION N/A 09/01/2020   Procedure: SMALL BOWEL RESECTION;  Surgeon: Georganna Skeans, MD;  Location: [place];  Service: General;  Laterality: N/A;   BREAST EXCISIONAL BIOPSY Right    BREAST SURGERY     right breast   CESAREAN SECTION     COLONOSCOPY WITH PROPOFOL N/A 07/26/2020   Procedure: COLONOSCOPY WITH PROPOFOL;  Surgeon: Carol Ada, MD;  Location: [place];  Service: Endoscopy;  Laterality: N/A;   ESOPHAGOGASTRODUODENOSCOPY (EGD) WITH PROPOFOL N/A 07/24/2020   Procedure: ESOPHAGOGASTRODUODENOSCOPY (EGD) WITH PROPOFOL;  Surgeon: Carol Ada, MD;  Location: [place];  Service: Endoscopy;  Laterality: N/A;  Bleeding with anemia-5 gms drop in hemoglobin   GIVENS CAPSULE STUDY N/A 08/21/2020   Procedure: GIVENS CAPSULE STUDY;  Surgeon: Juanita Craver, MD;  Location: [facility] ENDOSCOPY;  Service: Endoscopy;  Laterality: N/A;   IR ANGIOGRAM FOLLOW UP STUDY  08/29/2020   IR ANGIOGRAM SELECTIVE EACH  ADDITIONAL VESSEL  08/29/2020   IR ANGIOGRAM VISCERAL SELECTIVE  08/29/2020   IR ANGIOGRAM VISCERAL SELECTIVE  08/29/2020   IR EMBO ART  VEN HEMORR LYMPH EXTRAV  INC GUIDE ROADMAPPING  08/29/2020   IR US GUIDE VASC ACCESS RIGHT  08/29/2020   LAPAROTOMY N/A 09/01/2020   Procedure: EXPLORATORY LAPAROTOMY;  Surgeon: Georganna Skeans, MD;  Location: [place];  Service: General;  Laterality: N/A;    Family History  Problem Relation Age of Onset   Cancer Mother    Hypertension Sister    Diabetes Sister    Diabetes Maternal Grandmother     Social History:  reports that she has never smoked. She has never used smokeless tobacco. She reports that she does not drink alcohol and does not use drugs.  Allergies:  Allergies  Allergen Reactions   Aspirin Swelling   Augmentin [Amoxicillin-Pot Clavulanate] Swelling and Other (See Comments)    **patient has tolerated piperacillin/tazobactam, cephalexin, and ceftriaxone Hand swelled at IV site - likely extravasation    Prior to Admission medications   Medication Sig Start Date End Date Taking? Authorizing Provider  acetaminophen (TYLENOL) 500 MG tablet Take 1,000 mg by mouth every 6 (six) hours as needed for moderate pain or headache.   Yes [provider]  apixaban (ELIQUIS) 5 MG TABS tablet Take 1 tablet (5 mg total) by mouth 2 (two) times daily. 10/04/20  Yes Debbe Odea, MD  atorvastatin (LIPITOR) 80 MG tablet Take  1 tablet (80 mg total) by mouth daily at 6 PM. 12/03/20 03/03/21 Yes Thurnell Lose, MD  b complex vitamins tablet Take 1 tablet by mouth daily.   Yes [provider]  blood glucose meter kit and supplies KIT Dispense based on patient and insurance preference. Use up to four times daily as directed. 07/27/20  Yes Elgergawy, Silver Huguenin, MD  Calcium Carb-Cholecalciferol (CALCIUM+D3 PO) Take 1 tablet by mouth daily with lunch.   Yes [provider]  Ensure Max Protein (ENSURE MAX PROTEIN) LIQD Take 330 mLs (11 oz total)  by mouth at bedtime. Patient taking differently: Take 11 oz by mouth daily as needed (if not eating). 09/28/20  Yes Debbe Odea, MD  FEROSUL 325 (65 Fe) MG tablet Take 325 mg by mouth daily with breakfast. 07/27/20  Yes [provider]  gabapentin (NEURONTIN) 100 MG capsule Take 100 mg by mouth 3 (three) times daily. 08/01/20  Yes [provider]  glucosamine-chondroitin 500-400 MG tablet Take 1 tablet by mouth 3 (three) times daily. Patient taking differently: Take 1 tablet by mouth in the morning and at bedtime. 04/11/15  Yes Funches, Josalyn, MD  insulin aspart (NOVOLOG) 100 UNIT/ML FlexPen Inject 1-20 Units into the skin 3 (three) times daily with meals. CBG 70 - 120: 0 units  CBG 121 - 150: 3 units  CBG 151 - 200: 4 units  CBG 201 - 250: 7 units  CBG 251 - 300: 11 units  CBG 301 - 350: 15 units  CBG 351 - 400: 20 units 09/28/20  Yes Rizwan, Eunice Blase, MD  insulin glargine (LANTUS) 100 UNIT/ML injection Inject 0.14 mLs (14 Units total) into the skin at bedtime. Patient taking differently: Inject 25-28 Units into the skin 2 (two) times daily. 28 units in the morning 25 units at bedtime 09/28/20  Yes Rizwan, Eunice Blase, MD  Insulin Pen Needle 29G X 5MM MISC USE WITH LANTUS 07/27/20  Yes Elgergawy, Silver Huguenin, MD  Melatonin 10 MG TABS Take 10 mg by mouth at bedtime.   Yes [provider]  metoprolol tartrate (LOPRESSOR) 25 MG tablet Take 25 mg by mouth 2 (two) times daily. 11/08/20  Yes [provider]  Multiple Vitamin (MULTIVITAMIN WITH MINERALS) TABS tablet Take 1 tablet by mouth daily with lunch.    Yes [provider]  Omega-3 Fatty Acids (FISH OIL) 1000 MG CAPS Take 1,000 mg by mouth daily with lunch.    Yes [provider]  ondansetron (ZOFRAN ODT) 4 MG disintegrating tablet Take 1 tablet (4 mg total) by mouth every 8 (eight) hours as needed for nausea or vomiting. 09/28/20  Yes Debbe Odea, MD  pantoprazole (PROTONIX) 40 MG tablet Take 1 tablet  (40 mg total) by mouth daily. 01/17/20  Yes Regalado, Belkys A, MD  polyethylene glycol (MIRALAX / GLYCOLAX) 17 g packet Take 17 g by mouth 2 (two) times daily. Patient taking differently: Take 17 g by mouth daily as needed for mild constipation. 01/17/20  Yes Regalado, Belkys A, MD  predniSONE (DELTASONE) 20 MG tablet Take 40 mg by mouth 2 (two) times daily with a meal. 07/17/20  Yes [provider]  REFRESH 1.4-0.6 % SOLN Place 1 drop into both eyes daily as needed (dry eyes). 10/18/20  Yes [provider]  silver sulfADIAZINE (SILVADENE) 1 % cream Apply 1 application topically daily as needed (wound care). 02/07/21  Yes [provider]    Medications: I have reviewed the patient's current medications. Scheduled:  atorvastatin  80 mg Oral q1800   chlorhexidine  15 mL Mouth Rinse BID   collagenase   Topical BID   feeding supplement (GLUCERNA SHAKE)  237 mL Oral BID BM   ferrous sulfate  325 mg Oral Q breakfast   gabapentin  100 mg Oral TID   hydrocortisone sod succinate (SOLU-CORTEF) inj  100 mg Intravenous Q8H   insulin aspart  0-15 Units Subcutaneous TID WC   insulin aspart  0-5 Units Subcutaneous QHS   insulin glargine-yfgn  10 Units Subcutaneous QHS   mouth rinse  15 mL Mouth Rinse q12n4p   melatonin  10 mg Oral QHS   multivitamin with minerals  1 tablet Oral Daily   nystatin  5 mL Oral QID   pantoprazole  40 mg Oral Daily   silver nitrate applicators  1 application Topical Once   sodium chloride flush  3 mL Intravenous Q12H   Continuous:  ampicillin-sulbactam (UNASYN) IV 3 g (03/04/21 0617)   dextrose 5 % and 0.45% NaCl 75 mL/hr at 03/04/21 0332   potassium chloride     vancomycin 200 mL/hr at 03/03/21 1518    Results for orders placed or performed during the hospital encounter of 02/27/21 (from the past 48 hour(s))  Glucose, capillary     Status: None   Collection Time: 03/02/21  7:35 AM  Result Value Ref Range   Glucose-Capillary 82 70 - 99 mg/dL     Comment: Glucose reference range applies only to samples taken after fasting for at least 8 hours.  Glucose, capillary     Status: Abnormal   Collection Time: 03/02/21 11:25 AM  Result Value Ref Range   Glucose-Capillary 120 (H) 70 - 99 mg/dL    Comment: Glucose reference range applies only to samples taken after fasting for at least 8 hours.  Glucose, capillary     Status: Abnormal   Collection Time: 03/02/21  4:18 PM  Result Value Ref Range   Glucose-Capillary 148 (H) 70 - 99 mg/dL    Comment: Glucose reference range applies only to samples taken after fasting for at least 8 hours.  Glucose, capillary     Status: Abnormal   Collection Time: 03/02/21 10:44 PM  Result Value Ref Range   Glucose-Capillary 152 (H) 70 - 99 mg/dL    Comment: Glucose reference range applies only to samples taken after fasting for at least 8 hours.  Culture, blood (routine x 2)     Status: None (Preliminary result)   Collection Time: 03/03/21  4:58 AM   Specimen: BLOOD LEFT HAND  Result Value Ref Range   Specimen Description BLOOD LEFT HAND    Special Requests      BOTTLES DRAWN AEROBIC ONLY Blood Culture results may not be optimal due to an inadequate volume of blood received in culture bottles   Culture  Setup Time      GRAM POSITIVE COCCI IN CLUSTERS AEROBIC BOTTLE ONLY CRITICAL VALUE NOTED.  VALUE IS CONSISTENT WITH PREVIOUSLY REPORTED AND CALLED VALUE. Performed at [facility], [place] [address] [place], [place] [address]    Culture PENDING    Report Status PENDING   Glucose, capillary     Status: Abnormal   Collection Time: 03/03/21  7:38 AM  Result Value Ref Range   Glucose-Capillary 162 (H) 70 - 99 mg/dL    Comment: Glucose reference range applies only to samples taken after fasting for at least 8 hours.  Glucose, capillary     Status: Abnormal  Collection Time: 03/03/21  1:50 PM  Result Value Ref Range   Glucose-Capillary 142 (H) 70 - 99 mg/dL    Comment: Glucose reference range  applies only to samples taken after fasting for at least 8 hours.  Glucose, capillary     Status: Abnormal   Collection Time: 03/03/21  4:01 PM  Result Value Ref Range   Glucose-Capillary 145 (H) 70 - 99 mg/dL    Comment: Glucose reference range applies only to samples taken after fasting for at least 8 hours.  Glucose, capillary     Status: Abnormal   Collection Time: 03/03/21 10:06 PM  Result Value Ref Range   Glucose-Capillary 125 (H) 70 - 99 mg/dL    Comment: Glucose reference range applies only to samples taken after fasting for at least 8 hours.  Glucose, capillary     Status: Abnormal   Collection Time: 03/04/21  3:31 AM  Result Value Ref Range   Glucose-Capillary 105 (H) 70 - 99 mg/dL    Comment: Glucose reference range applies only to samples taken after fasting for at least 8 hours.  CBC with Differential/Platelet     Status: Abnormal   Collection Time: 03/04/21  4:15 AM  Result Value Ref Range   WBC 6.7 4.0 - 10.5 K/uL   RBC 3.30 (L) 3.87 - 5.11 MIL/uL   Hemoglobin 9.3 (L) 12.0 - 15.0 g/dL   HCT 29.4 (L) 36.0 - 46.0 %   MCV 89.1 80.0 - 100.0 fL   MCH 28.2 26.0 - 34.0 pg   MCHC 31.6 30.0 - 36.0 g/dL   RDW 16.4 (H) 11.5 - 15.5 %   Platelets 6 (LL) 150 - 400 K/uL    Comment: Immature Platelet Fraction may be clinically indicated, consider ordering this additional test [id number] REPEATED TO VERIFY PLATELET COUNT CONFIRMED BY SMEAR THIS CRITICAL RESULT HAS VERIFIED AND BEEN CALLED TO B HARRIS RN BY CANDACE HAYES ON 10 17 2022 AT 0600, AND HAS BEEN READ BACK.     nRBC 0.6 (H) 0.0 - 0.2 %   Neutrophils Relative % 88 %   Neutro Abs 5.8 1.7 - 7.7 K/uL   Lymphocytes Relative 4 %   Lymphs Abs 0.3 (L) 0.7 - 4.0 K/uL   Monocytes Relative 1 %   Monocytes Absolute 0.1 0.1 - 1.0 K/uL   Eosinophils Relative 0 %   Eosinophils Absolute 0.0 0.0 - 0.5 K/uL   Basophils Relative 0 %   Basophils Absolute 0.0 0.0 - 0.1 K/uL   WBC Morphology MILD LEFT SHIFT (1-5% METAS, OCC MYELO, OCC  BANDS)    RBC Morphology MORPHOLOGY UNREMARKABLE    Smear Review PLATELETS APPEAR DECREASED    Immature Granulocytes 7 %   Abs Immature Granulocytes 0.44 (H) 0.00 - 0.07 K/uL    Comment: Performed at [facility] [address] [place], [place] [address]  Protime-INR     Status: None   Collection Time: 03/04/21  4:15 AM  Result Value Ref Range   Prothrombin Time 15.0 11.4 - 15.2 seconds   INR 1.2 0.8 - 1.2    Comment: (NOTE) INR goal varies based on device and disease states. Performed at [facility], [place] [address], [place], [place] [address]   Basic metabolic panel     Status: Abnormal   Collection Time: 03/04/21  4:15 AM  Result Value Ref Range   Sodium 137 135 - 145 mmol/L   Potassium 2.4 (LL) 3.5 - 5.1 mmol/L  Comment: CRITICAL RESULT CALLED TO, READ BACK BY AND VERIFIED WITH: BRANDY HARRIS RN 03/04/21 0622 Wiliam Ke    Chloride 101 98 - 111 mmol/L   CO2 28 22 - 32 mmol/L   Glucose, Bld 113 (H) 70 - 99 mg/dL    Comment: Glucose reference range applies only to samples taken after fasting for at least 8 hours.   BUN 13 6 - 20 mg/dL   Creatinine, Ser 0.70 0.44 - 1.00 mg/dL   Calcium 7.7 (L) 8.9 - 10.3 mg/dL   GFR, Estimated >60 >60 mL/min    Comment: (NOTE) Calculated using the CKD-EPI Creatinine Equation (2021)    Anion gap 8 5 - 15    Comment: Performed at [facility] [address], [place],  [address]    MR CHEST W WO CONTRAST  Result Date: 03/03/2021 CLINICAL DATA:  Evaluate abscess EXAM: MRI CHEST WITHOUT AND WITH CONTRAST TECHNIQUE: Multiplanar, multisequence MR examination the upper right chest centered about the right sternoclavicular joint was performed both prior to and following the uncomplicated IV administration gadolinium contrast. CONTRAST:  7.79m GADAVIST GADOBUTROL 1 MMOL/ML IV SOLN COMPARISON:  CT chest angiogram, 02/28/2021 FINDINGS: Limited mediastinum: Unremarkable. Limited lungs and pleura: Unremarkable.  Musculoskeletal: Large, rim enhancing fluid collection centered about the right sternoclavicular joint, measuring 5.8 x 4.2 cm. There is no overt bony destruction or bone marrow edema (series 16, image 15). Additional, likely contiguous air and fluid collection with similar rim enhancement just superior to the sternal notch, incompletely imaged on this examination measuring approximately 6.3 x 4.0 cm (series 16, image 9). IMPRESSION: 1. Large, rim enhancing fluid collection centered about the right sternoclavicular joint, measuring 5.8 x 4.2 cm. 2. Additional, likely contiguous air and fluid collection with similar rim enhancement just superior to the sternal notch, incompletely imaged on this examination measuring approximately 6.3 x 4.0 cm. 3. No overt bony destruction or associated bone marrow edema. 4. Findings are most consistent with septic arthritis. Consider percutaneous fluid sampling. Electronically Signed   By: ADelanna AhmadiM.D.   On: 03/03/2021 15:07   ECHOCARDIOGRAM COMPLETE  Result Date: 03/03/2021    ECHOCARDIOGRAM REPORT   Patient Name:   [facility]oscopy[facility] of Exam: 03/03/2021 Medical Rec #:  [id number]     Height:       63.0 in Accession #:    [id number]    Weight:       178.0 lb Date of Birth:  [birth year]     BSA:          1.840 m Patient Age:    89years       BP:           132/70 mmHg Patient Gender: F              HR:           92 bpm. Exam Location:  Inpatient Procedure: 2D Echo, Cardiac Doppler and Color Doppler Indications:    Bacteremia  History:        Patient has prior history of Echocardiogram examinations, most                 recent 01/15/2020. TIA, Signs/Symptoms:Shortness of Breath; Risk                 Factors:Hypertension and Diabetes.  Sonographer:    DWenda LowReferring Phys: [id number] 1. Left ventricular ejection fraction, by estimation, is 50 to 55%  with beat to beat variability. The left ventricle has low normal function. Left  ventricular endocardial border not optimally defined to evaluate regional wall motion. There is mild left ventricular hypertrophy. Indeterminate diastolic filling due to E-A fusion.  2. Right ventricular systolic function is normal. The right ventricular size is normal. There is normal pulmonary artery systolic pressure. The estimated right ventricular systolic pressure is 38.1 mmHg.  3. A small pericardial effusion is present. The pericardial effusion is posterior to the left ventricle.  4. The mitral valve is normal in structure. Trivial mitral valve regurgitation. No evidence of mitral stenosis.  5. The aortic valve is tricuspid. There is mild calcification of the aortic valve. Aortic valve regurgitation is trivial. No aortic stenosis is present.  6. The inferior vena cava is normal in size with greater than 50% respiratory variability, suggesting right atrial pressure of 3 mmHg. Conclusion(s)/Recommendation(s): No evidence of valvular vegetations on this transthoracic echocardiogram. Consider a transesophageal echocardiogram to exclude infective endocarditis if clinically indicated. FINDINGS  Left Ventricle: Left ventricular ejection fraction, by estimation, is 50 to 55%. The left ventricle has low normal function. Left ventricular endocardial border not optimally defined to evaluate regional wall motion. The left ventricular internal cavity  size was normal in size. There is mild left ventricular hypertrophy. Indeterminate diastolic filling due to E-A fusion. Right Ventricle: The right ventricular size is normal. No increase in right ventricular wall thickness. Right ventricular systolic function is normal. There is normal pulmonary artery systolic pressure. The tricuspid regurgitant velocity is 2.78 m/s, and  with an assumed right atrial pressure of 3 mmHg, the estimated right ventricular systolic pressure is 01.7 mmHg. Left Atrium: Left atrial size was normal in size. Right Atrium: Right atrial size was  normal in size. Pericardium: A small pericardial effusion is present. The pericardial effusion is posterior to the left ventricle. Presence of pericardial fat pad. Mitral Valve: The mitral valve is normal in structure. Trivial mitral valve regurgitation. No evidence of mitral valve stenosis. MV peak gradient, 4.9 mmHg. The mean mitral valve gradient is 1.0 mmHg. Tricuspid Valve: The tricuspid valve is normal in structure. Tricuspid valve regurgitation is mild . No evidence of tricuspid stenosis. Aortic Valve: The aortic valve is tricuspid. There is mild calcification of the aortic valve. Aortic valve regurgitation is trivial. No aortic stenosis is present. Aortic valve mean gradient measures 5.7 mmHg. Aortic valve peak gradient measures 12.0 mmHg. Aortic valve area, by VTI measures 1.80 cm. Pulmonic Valve: The pulmonic valve was normal in structure. Pulmonic valve regurgitation is trivial. No evidence of pulmonic stenosis. Aorta: The aortic root is normal in size and structure. Venous: The inferior vena cava is normal in size with greater than 50% respiratory variability, suggesting right atrial pressure of 3 mmHg. IAS/Shunts: The interatrial septum was not well visualized.  LEFT VENTRICLE PLAX 2D LVIDd:         3.90 cm   Diastology LVIDs:         2.50 cm   LV e' medial:    5.22 cm/s LV PW:         1.20 cm   LV E/e' medial:  10.9 LV IVS:        1.10 cm   LV e' lateral:   3.81 cm/s LVOT diam:     2.00 cm   LV E/e' lateral: 15.0 LV SV:         49 LV SV Index:   26 LVOT Area:     3.14 cm  RIGHT VENTRICLE RV Basal diam:  2.75 cm RV Mid diam:    2.20 cm LEFT ATRIUM             Index        RIGHT ATRIUM           Index LA diam:        2.60 cm 1.41 cm/m   RA Area:     11.10 cm LA Vol (A2C):   35.9 ml 19.51 ml/m  RA Volume:   22.50 ml  12.23 ml/m LA Vol (A4C):   47.9 ml 26.03 ml/m LA Biplane Vol: 43.8 ml 23.80 ml/m  AORTIC VALVE                     PULMONIC VALVE AV Area (Vmax):    1.92 cm      PV Vmax:       0.76  m/s AV Area (Vmean):   1.81 cm      PV Peak grad:  2.3 mmHg AV Area (VTI):     1.80 cm AV Vmax:           173.55 cm/s AV Vmean:          109.859 cm/s AV VTI:            0.271 m AV Peak Grad:      12.0 mmHg AV Mean Grad:      5.7 mmHg LVOT Vmax:         106.00 cm/s LVOT Vmean:        63.300 cm/s LVOT VTI:          0.155 m LVOT/AV VTI ratio: 0.57  AORTA Ao Root diam: 3.00 cm MITRAL VALVE                TRICUSPID VALVE MV Area (PHT): 4.44 cm     TR Peak grad:   30.9 mmHg MV Area VTI:   1.80 cm     TR Vmax:        278.00 cm/s MV Peak grad:  4.9 mmHg MV Mean grad:  1.0 mmHg     SHUNTS MV Vmax:       1.11 m/s     Systemic VTI:  0.16 m MV Vmean:      53.6 cm/s    Systemic Diam: 2.00 cm MV Decel Time: 171 msec MV E velocity: 57.00 cm/s MV A velocity: 111.00 cm/s MV E/A ratio:  0.51 Cherlynn Kaiser MD Electronically signed by Cherlynn Kaiser MD Signature Date/Time: 03/03/2021/1:25:09 PM    Final     Review of Systems  Constitutional:  Negative for fever.  Respiratory:  Positive for shortness of breath on admission. It has since resolved. Negative for cough.   Cardiovascular:  Negative for chest pain.  Gastrointestinal:  Negative for blood in stool and vomiting.  All other systems reviewed and are negative.  Blood pressure (!) 122/93, pulse 95, temperature 98 F (36.7 C), temperature source Oral, resp. rate 15, height _0  (1.6 m), weight 80.7 kg, SpO2 100 %. General appearance: Resting comfortably. NAD. Head: Normocephalic, without obvious abnormality, atraumatic Eyes: Pupils are equal, round, reactive to light.  Ears: Examination of the ears shows normal auricles and external auditory canals bilaterally.  Nose: Nasal examination shows normal mucosa, septum, turbinates.  Face: Facial examination shows no asymmetry. Palpation of the face elicit no significant tenderness.  Mouth: Oral cavity examination shows no mucosal lacerations or abnormality. Neck: Palpation of the neck reveals no  lymphadenopathy or  mass. The trachea is midline.  Chest: No stridor or respiratory distress.  Assessment/Plan: Anterior chest wall and lower neck abscess, 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 - Continue with IV antibiotics as per ID. - No upper airway compromise from the abscess cavity. - Pt is scheduled to undergo IR drainage of the abscess cavity. - No ENT intervention is recommended at this time.  Darian Cansler W Delonte Musich 03/04/2021, 7:07 AM

## 2021-03-04 NOTE — Progress Notes (Signed)
Physical Therapy Wound Treatment Patient Details  Name: Rhonda [name] MRN: [id number] Date of Birth: [birth year]  Today's Date: 03/04/2021 Time: 1420-1506 Time Calculation (min): 46 min  Subjective  Subjective Assessment Subjective: patient moaning but no verbalization Patient and Family Stated Goals: Not stated Date of Onset:  (prior to hospitalization) Prior Treatments: Unknown  Pain Score:  Premedicated however pt reports pain "everywhere" during session.   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 pink wound, necrotic tissue present, drainage around sacral wound with moderate serosanguineous drainage. (Active)  Dressing Type Gauze (Comment);Foam - Lift dressing to assess site every shift;Moist to moist;Santyl 03/04/21 1539  Dressing Changed;Clean;Dry;Intact 03/04/21 1539  Dressing Change Frequency Daily 03/04/21 1539  State of Healing Early/partial granulation 03/04/21 1539  Site / Wound Assessment Pink;Brown;Black 03/04/21 1539  % Wound base Red or Granulating 55% 03/04/21 1539  % Wound base Yellow/Fibrinous Exudate 10% 03/04/21 1539  % Wound base Black/Eschar 35% 03/04/21 1539  % Wound base Other/Granulation Tissue (Comment) 0% 03/04/21 1539  Peri-wound Assessment Intact 03/04/21 1539  Wound Length (cm) 10 cm 03/01/21 0800  Wound Width (cm) 6 cm 03/01/21 0800  Wound Depth (cm) 4 cm 03/01/21 0800  Wound Surface Area (cm^2) 60 cm^2 03/01/21 0800  Wound Volume (cm^3) 240 cm^3 03/01/21 0800  Tunneling (cm) 7 02/28/21 1050  Undermining (cm) 2-5 cm undermining 8 o'clock to 3 o'clock 03/01/21 0800  Margins Unattached edges (unapproximated) 03/04/21 1539  Drainage Amount Moderate 03/04/21 1539  Drainage Description Serosanguineous 03/04/21 1539  Treatment Debridement (Selective);Hydrotherapy (Pulse lavage);Packing (Saline gauze) 03/04/21 1539      Hydrotherapy Pulsed lavage therapy -  wound location: sacrum Pulsed Lavage with Suction (psi): 8 psi Pulsed Lavage with Suction - Normal Saline Used: 1000 mL Pulsed Lavage Tip: Tip with splash shield Selective Debridement Selective Debridement - Location: sacrum Selective Debridement - Tools Used: Forceps, Scissors Selective Debridement - Tissue Removed: black necrotic tissue    Wound Assessment and Plan  Wound Therapy - Assess/Plan/Recommendations Wound Therapy - Clinical Statement: Premedicated, however pt reports pain "everywhere" during session. Overall tolerated treatment well as pain did not seem to be originating from sacral wound. Progressing with debridement. This patient will benefit from continued hydrotherapy for selective removal of unviable tissue, to decrease bioburden, and promote wound bed healing. Wound Therapy - Functional Problem List: Decreased tolerance for sitting Factors Delaying/Impairing Wound Healing: Incontinence, Immobility, Multiple medical problems, Polypharmacy Hydrotherapy Plan: Debridement, Dressing change, Patient/family education, Pulsatile lavage with suction Wound Therapy - Frequency: 6X / week Wound Therapy - Follow Up Recommendations: dressing changes by RN, dressing changes by family/patient  Wound Therapy Goals- Improve the function of patient's integumentary system by progressing the wound(s) through the phases of wound healing (inflammation - proliferation - remodeling) by: Wound Therapy Goals - Improve the function of patient's integumentary system by progressing the wound(s) through the phases of wound healing by: Decrease Necrotic Tissue to: 20 Decrease Necrotic Tissue - Progress: Progressing toward goal Increase Granulation Tissue to: 80 Increase Granulation Tissue - Progress: Progressing toward goal Goals/treatment plan/discharge plan were made with and agreed upon by patient/family: No, Patient unable to participate in goals/treatment/discharge plan and family unavailable Time  For Goal Achievement: 7 days Wound Therapy - Potential for Goals: Good  Goals will be updated until maximal potential achieved or discharge criteria met.  Discharge criteria: when goals achieved, discharge from hospital, MD decision/surgical intervention, no progress towards goals, refusal/missing three  consecutive treatments without notification or medical reason.  GP     Charges PT Wound Care Charges $Wound Debridement up to 20 cm: < or equal to 20 cm $ Wound Debridement each add'l 20 sqcm: 1 $PT Hydrotherapy Dressing: 1 dressing $PT PLS Gun and Tip: 1 Supply $PT Hydrotherapy Visit: 1 Visit       Thelma Comp 03/04/2021, 3:58 PM  Rolinda Roan, PT, DPT Acute Rehabilitation Services Pager: [phone] Office: [phone]

## 2021-03-04 NOTE — Progress Notes (Addendum)
[facility]  PROGRESS NOTE  Rhonda [name] TZG:[id number] DOB: Aug 09, 1960 DOA: 02/27/2021 PCP: [facility] HPI:   60 year old female with medical history of hypertension, OSA not on CPAP, prediabetes, neurosarcoidosis, colostomy, seizures presents with shortness of breath. She was hospitalized in 5/22 with perforated sigmoid colon leading to emergency exploratory laparotomy with Hartman's procedure, she has delayed wound healing with wound VAC placement abdominal wound is slowly improving over time. Patient has history of neurosarcoid, debilitated, bedbound, has large sacral wound.  General surgery was consulted for debridement of sacral ulcer stage IV  Patient underwent debridement of sacral pressure ulcer, next morning patient became more lethargic.  Suspicion for sepsis was the also lactic acid was ordered initially lactic acid was 1.6, repeat lactic acid was 6.0.  Patient was started on empiric antibiotics vancomycin and Zosyn, blood cultures x 2 were obtained.  Patient's blood pressure remained stable so no IV fluid boluses were given.  Blood culture grew MRSA, so ID was consulted.  ID recommended to obtain MRI without contrast as CT chest without contrast had shown questionable abscess in the chest wall.  Initially radiology felt they were unable to safely perform MRI of chest due to implanted coils, later radiologist said they could do MR chest.  MR I chest was done which showed septic arthritis of right sternoclavicular joint.  CT surgery was consulted, and it was felt that patient is too frail with poor nutritional status to undergo surgery so CT surgeon recommended CT-guided drain placement per IR.  ENT was also consulted for extension of the chest wall abscess into neck however ENT does not feel that there is a need for any surgical intervention at this time.  IR was consulted for CT-guided drain placement, however this morning patient's platelet count is down to  6000.  I spoke to hematology/oncology, Dr. Alvy Bimler feels that this is due to antibiotics patient has been getting for sepsis.  She feels that if it is urgent to put the drain then patient should be given 2 units platelets before the procedure and 1 unit after the procedure.     Subjective   Patient seen and examined, somnolent but arousable.  Says that she does not feel well.Bair hugger in place for hypothermia   Assessment/Plan:    Severe sepsis/MRSA bacteremia -Patient presented with shortness of breath -Was seen by general surgery and underwent debridement of stage IV sacral pressure ulcer -On 03/01/2021, patient developed altered mental status -Sepsis work-up was initiated, lactic acid initially was 1.6, repeat lactic acid was 6.0 -Blood and urine cultures were obtained.  MRSA growing in all 4 bottles from blood cultures -Patient was empirically started on vancomycin and Zosyn on 03/01/2021 -Antibiotics have been changed to vancomycin and Unasyn per pharmacy -Source of infection likely decubitus ulcer versus questionable chest wall abscess, sternoclavicular joint septic arthritis -As patient blood pressure was stable, no need of 30 cc/kg IV fluids to avoid fluid overload -ID is following  ?  Chest wall abscess -CTA chest showed 7.5 cm anterior chest wall abscess of uncertain origin -Questionable sternoclavicular joint septic arthritis -MRI chest without contrast ordered by ID -Initially radiology felt that they could not do MRI chest because of the implanted abdominal coils -However they were able to do MRI chest which confirmed septic arthritis of right sternoclavicular joint -CT surgery was consulted, they do not feel that patient is stable enough to go for surgical intervention -CT surgery recommended CT-guided drain placement and drainage of  the abscess -Spoke to the IR this morning, as patient has low platelet count of 6000.  They recommend giving 2 units of platelets before  putting a drain -ENT also consulted, does not feel any need for surgical intervention for extension of the lower neck abscess, which is likely coming from the septic arthritis of right sternoclavicular joint   Thrombocytopenia -Patient has history of chronic thrombocytopenia -She presented with platelet count of 56,000 -Since starting antibiotics platelet count has been steadily going down -Yesterday.  Count was 30,000, this morning count is down to 6000 -Immature platelet fraction assay ordered -No signs and symptoms of bleeding -I spoke to hematology/oncology, Dr. Alvy Bimler, she will see patient today -Likely due to antibiotics -She recommends giving 2 units of platelets before procedure  Hypokalemia -Potassium is 2.4 today -We will order IV KCl 10 mEq x 5 -Check serum magnesium today  Dysphagia -Swallow evaluation obtained - patient was initially started on dysphagia 3 diet -Has been n.p.o. due to altered mental status -Started on D5 half-normal saline at 75 mill per hour  Altered mental status -Likely from above -We will continue to monitor  Sacral pressure ulcer stage IV -General surgery did debridement yesterday -CT pelvis showed sacral decubitus ulcer with regional cellulitis and soft tissue emphysema -No osteomyelitis or drainable fluid collection -Wound care following  Diabetes mellitus type 2 -Recent hemoglobin A1c was 7.8 -Continue Lantus, moderate sliding scale insulin with NovoLog -CBG has been low -We will start D5 half-normal saline at 75 mill per hour -Patient was on Lantus 28 units in the morning and 25 units in p.m. -Dose of Lantus was cut down to 10 units subcu daily   History of pulmonary embolism -Eliquis was held yesterday due to thrombocytopenia -We will continue to monitor patient's platelet count -Consider restarting Eliquis if platelet count remains stable  Hypertension -Blood pressure is stable, though on lower side -Lopressor on  hold  Neurosarcoidosis -Patient is nonambulatory -Patient was started on prednisone 40 mg twice daily -As patient is not able to take by mouth, will start stress dose with Solu-Cortef 100 mg IV every 8 hours -Palliative care has been consulted  Obstructive sleep apnea -Patient not on CPAP  Goals of care Discussed with patient's husband at bedside, patient is very sick considering MRSA bacteremia/severe sepsis, altered mental status.  She is DNR/DNI, not requiring pressors at this time.  She has been started on vancomycin and Zosyn as above.  If she does not turnaround next 24 to 48 hours, she has poor prognosis considering multiple medical problems as above.  Patient's husband understands her poor prognosis.  If no improvement in next 24 to 48 hours, consider comfort measures only.  Scheduled medications:    atorvastatin  80 mg Oral q1800   chlorhexidine  15 mL Mouth Rinse BID   collagenase   Topical BID   feeding supplement (GLUCERNA SHAKE)  237 mL Oral BID BM   ferrous sulfate  325 mg Oral Q breakfast   gabapentin  100 mg Oral TID   hydrocortisone sod succinate (SOLU-CORTEF) inj  100 mg Intravenous Q8H   insulin aspart  0-15 Units Subcutaneous TID WC   insulin aspart  0-5 Units Subcutaneous QHS   insulin glargine-yfgn  10 Units Subcutaneous QHS   mouth rinse  15 mL Mouth Rinse q12n4p   melatonin  10 mg Oral QHS   multivitamin with minerals  1 tablet Oral Daily   nystatin  5 mL Oral QID   pantoprazole  40  mg Oral Daily   silver nitrate applicators  1 application Topical Once   sodium chloride flush  3 mL Intravenous Q12H     Data Reviewed:   CBG:  Recent Labs  Lab 03/03/21 1350 03/03/21 1601 03/03/21 2206 03/04/21 0331 03/04/21 0810  GLUCAP 142* 145* 125* 105* 123*    SpO2: 100 % O2 Flow Rate (L/min): 2 L/min    Vitals:   03/02/21 2016 03/03/21 1905 03/03/21 2213 03/03/21 2336  BP: 132/70  (!) 126/91 (!) 122/93  Pulse: 92  (!) 103 95  Resp: 15  15   Temp:  97.7 F (36.5 C)  97.9 F (36.6 C) 98 F (36.7 C)  TempSrc: Axillary  Axillary Oral  SpO2: 100% 99% 99% 100%  Weight:      Height:         Intake/Output Summary (Last 24 hours) at 03/04/2021 0843 Last data filed at 03/03/2021 1518 Gross per 24 hour  Intake 1048.21 ml  Output --  Net 1048.21 ml    10/15 1901 - 10/17 0700 In: 2989.1 [I.V.:2210.3] Out: 500 [Urine:500]  Filed Weights   02/27/21 2353  Weight: 80.7 kg    Data Reviewed: Basic Metabolic Panel: Recent Labs  Lab 02/28/21 0018 03/01/21 0504 03/02/21 0311 03/04/21 0415  NA 127* 131* 129* 137  K 2.5* 4.2 4.8 2.4*  CL 88* 90* 94* 101  CO2 29 30 19* 28  GLUCOSE 372* 120* 98 113*  BUN 6 11 17 13   CREATININE 0.50 0.52 0.78 0.70  CALCIUM 8.1* 9.1 8.6* 7.7*  MG 1.7  --   --  1.6*   Liver Function Tests: No results for input(s): AST, ALT, ALKPHOS, BILITOT, PROT, ALBUMIN in the last 168 hours. No results for input(s): LIPASE, AMYLASE in the last 168 hours. No results for input(s): AMMONIA in the last 168 hours. CBC: Recent Labs  Lab 02/28/21 0018 03/01/21 0504 03/02/21 0411 03/04/21 0415  WBC 13.1* 13.5* 14.2* 6.7  NEUTROABS 12.3*  --   --  5.8  HGB 10.8* 12.2 11.3* 9.3*  HCT 35.1* 39.6 36.6 29.4*  MCV 92.9 90.2 90.8 89.1  PLT 56* 40* 30* 6*   Cardiac Enzymes: No results for input(s): CKTOTAL, CKMB, CKMBINDEX, TROPONINI in the last 168 hours. BNP (last 3 results) Recent Labs    11/23/20 0235 11/24/20 0450 02/28/21 0018  BNP 160.8* 152.6* 440.8*    ProBNP (last 3 results) No results for input(s): PROBNP in the last 8760 hours.  CBG: Recent Labs  Lab 03/03/21 1350 03/03/21 1601 03/03/21 2206 03/04/21 0331 03/04/21 0810  GLUCAP 142* 145* 125* 105* 123*       Radiology Reports  MR CHEST W WO CONTRAST  Result Date: 03/03/2021 CLINICAL DATA:  Evaluate abscess EXAM: MRI CHEST WITHOUT AND WITH CONTRAST TECHNIQUE: Multiplanar, multisequence MR examination the upper right chest  centered about the right sternoclavicular joint was performed both prior to and following the uncomplicated IV administration gadolinium contrast. CONTRAST:  7.20mL GADAVIST GADOBUTROL 1 MMOL/ML IV SOLN COMPARISON:  CT chest angiogram, 02/28/2021 FINDINGS: Limited mediastinum: Unremarkable. Limited lungs and pleura: Unremarkable. Musculoskeletal: Large, rim enhancing fluid collection centered about the right sternoclavicular joint, measuring 5.8 x 4.2 cm. There is no overt bony destruction or bone marrow edema (series 16, image 15). Additional, likely contiguous air and fluid collection with similar rim enhancement just superior to the sternal notch, incompletely imaged on this examination measuring approximately 6.3 x 4.0 cm (series 16, image 9). IMPRESSION: 1. Large, rim enhancing fluid  collection centered about the right sternoclavicular joint, measuring 5.8 x 4.2 cm. 2. Additional, likely contiguous air and fluid collection with similar rim enhancement just superior to the sternal notch, incompletely imaged on this examination measuring approximately 6.3 x 4.0 cm. 3. No overt bony destruction or associated bone marrow edema. 4. Findings are most consistent with septic arthritis. Consider percutaneous fluid sampling. Electronically Signed   By: Delanna Ahmadi M.D.   On: 03/03/2021 15:07   ECHOCARDIOGRAM COMPLETE  Result Date: 03/03/2021    ECHOCARDIOGRAM REPORT   Patient Name:   SHAWNYA MAYOR [facility] Date of Exam: 03/03/2021 Medical Rec #:  [id number]      Height:       63.0 in Accession #:    [id number]     Weight:       178.0 lb Date of Birth:  April 01, 1961      BSA:          1.840 m Patient Age:    68 years       BP:           132/70 mmHg Patient Gender: F              HR:           92 bpm. Exam Location:  Inpatient Procedure: 2D Echo, Cardiac Doppler and Color Doppler Indications:    Bacteremia  History:        Patient has prior history of Echocardiogram examinations, most                 recent 01/15/2020. TIA,  Signs/Symptoms:Shortness of Breath; Risk                 Factors:Hypertension and Diabetes.  Sonographer:    Wenda Low Referring Phys: [id number] [place]  1. Left ventricular ejection fraction, by estimation, is 50 to 55% with beat to beat variability. The left ventricle has low normal function. Left ventricular endocardial border not optimally defined to evaluate regional wall motion. There is mild left ventricular hypertrophy. Indeterminate diastolic filling due to E-A fusion.  2. Right ventricular systolic function is normal. The right ventricular size is normal. There is normal pulmonary artery systolic pressure. The estimated right ventricular systolic pressure is 25.8 mmHg.  3. A small pericardial effusion is present. The pericardial effusion is posterior to the left ventricle.  4. The mitral valve is normal in structure. Trivial mitral valve regurgitation. No evidence of mitral stenosis.  5. The aortic valve is tricuspid. There is mild calcification of the aortic valve. Aortic valve regurgitation is trivial. No aortic stenosis is present.  6. The inferior vena cava is normal in size with greater than 50% respiratory variability, suggesting right atrial pressure of 3 mmHg. Conclusion(s)/Recommendation(s): No evidence of valvular vegetations on this transthoracic echocardiogram. Consider a transesophageal echocardiogram to exclude infective endocarditis if clinically indicated. FINDINGS  Left Ventricle: Left ventricular ejection fraction, by estimation, is 50 to 55%. The left ventricle has low normal function. Left ventricular endocardial border not optimally defined to evaluate regional wall motion. The left ventricular internal cavity  size was normal in size. There is mild left ventricular hypertrophy. Indeterminate diastolic filling due to E-A fusion. Right Ventricle: The right ventricular size is normal. No increase in right ventricular wall thickness. Right ventricular systolic  function is normal. There is normal pulmonary artery systolic pressure. The tricuspid regurgitant velocity is 2.78 m/s, and  with an assumed right atrial pressure of 3 mmHg, the estimated right ventricular systolic  pressure is 33.9 mmHg. Left Atrium: Left atrial size was normal in size. Right Atrium: Right atrial size was normal in size. Pericardium: A small pericardial effusion is present. The pericardial effusion is posterior to the left ventricle. Presence of pericardial fat pad. Mitral Valve: The mitral valve is normal in structure. Trivial mitral valve regurgitation. No evidence of mitral valve stenosis. MV peak gradient, 4.9 mmHg. The mean mitral valve gradient is 1.0 mmHg. Tricuspid Valve: The tricuspid valve is normal in structure. Tricuspid valve regurgitation is mild . No evidence of tricuspid stenosis. Aortic Valve: The aortic valve is tricuspid. There is mild calcification of the aortic valve. Aortic valve regurgitation is trivial. No aortic stenosis is present. Aortic valve mean gradient measures 5.7 mmHg. Aortic valve peak gradient measures 12.0 mmHg. Aortic valve area, by VTI measures 1.80 cm. Pulmonic Valve: The pulmonic valve was normal in structure. Pulmonic valve regurgitation is trivial. No evidence of pulmonic stenosis. Aorta: The aortic root is normal in size and structure. Venous: The inferior vena cava is normal in size with greater than 50% respiratory variability, suggesting right atrial pressure of 3 mmHg. IAS/Shunts: The interatrial septum was not well visualized.  LEFT VENTRICLE PLAX 2D LVIDd:         3.90 cm   Diastology LVIDs:         2.50 cm   LV e' medial:    5.22 cm/s LV PW:         1.20 cm   LV E/e' medial:  10.9 LV IVS:        1.10 cm   LV e' lateral:   3.81 cm/s LVOT diam:     2.00 cm   LV E/e' lateral: 15.0 LV SV:         49 LV SV Index:   26 LVOT Area:     3.14 cm  RIGHT VENTRICLE RV Basal diam:  2.75 cm RV Mid diam:    2.20 cm LEFT ATRIUM             Index        RIGHT  ATRIUM           Index LA diam:        2.60 cm 1.41 cm/m   RA Area:     11.10 cm LA Vol (A2C):   35.9 ml 19.51 ml/m  RA Volume:   22.50 ml  12.23 ml/m LA Vol (A4C):   47.9 ml 26.03 ml/m LA Biplane Vol: 43.8 ml 23.80 ml/m  AORTIC VALVE                     PULMONIC VALVE AV Area (Vmax):    1.92 cm      PV Vmax:       0.76 m/s AV Area (Vmean):   1.81 cm      PV Peak grad:  2.3 mmHg AV Area (VTI):     1.80 cm AV Vmax:           173.55 cm/s AV Vmean:          109.859 cm/s AV VTI:            0.271 m AV Peak Grad:      12.0 mmHg AV Mean Grad:      5.7 mmHg LVOT Vmax:         106.00 cm/s LVOT Vmean:        63.300 cm/s LVOT VTI:  0.155 m LVOT/AV VTI ratio: 0.57  AORTA Ao Root diam: 3.00 cm MITRAL VALVE                TRICUSPID VALVE MV Area (PHT): 4.44 cm     TR Peak grad:   30.9 mmHg MV Area VTI:   1.80 cm     TR Vmax:        278.00 cm/s MV Peak grad:  4.9 mmHg MV Mean grad:  1.0 mmHg     SHUNTS MV Vmax:       1.11 m/s     Systemic VTI:  0.16 m MV Vmean:      53.6 cm/s    Systemic Diam: 2.00 cm MV Decel Time: 171 msec MV E velocity: 57.00 cm/s MV A velocity: 111.00 cm/s MV E/A ratio:  0.51 Cherlynn Kaiser MD Electronically signed by Cherlynn Kaiser MD Signature Date/Time: 03/03/2021/1:25:09 PM    Final        Antibiotics: Anti-infectives (From admission, onward)    Start     Dose/Rate Route Frequency Ordered Stop   03/02/21 1800  Ampicillin-Sulbactam (UNASYN) 3 g in sodium chloride 0.9 % 100 mL IVPB        3 g 200 mL/hr over 30 Minutes Intravenous Every 6 hours 03/02/21 1652     03/02/21 1200  vancomycin (VANCOCIN) IVPB 1000 mg/200 mL premix        1,000 mg 200 mL/hr over 60 Minutes Intravenous Every 24 hours 03/01/21 1326     03/01/21 1230  vancomycin (VANCOREADY) IVPB 1750 mg/350 mL        1,750 mg 175 mL/hr over 120 Minutes Intravenous  Once 03/01/21 1123 03/01/21 1459   03/01/21 1200  piperacillin-tazobactam (ZOSYN) IVPB 3.375 g  Status:  Discontinued        3.375 g 12.5 mL/hr over  240 Minutes Intravenous Every 8 hours 03/01/21 1123 03/02/21 0021         DVT prophylaxis: SCDs  Code Status: DNR  Family Communication: No family at bedside   Consultants: General surgery  Procedures: Debridement of sacral pressure ulcer stage IV    Objective    Physical Examination:  General-appears lethargic Heart-S1-S2, regular, no murmur auscultated Lungs-clear to auscultation bilaterally, no wheezing or crackles auscultated Abdomen-soft, nontender, no organomegaly Extremities-no edema in the lower extremities Neuro-lethargic, opens eyes to verbal stimuli  Status is: Inpatient  Dispo: The patient is from: Home              Anticipated d/c is to: To be decided              Anticipated d/c date is: 03/05/2021              Patient currently not stable for discharge  Barrier to discharge-severe sepsis  COVID-19 Labs  No results for input(s): DDIMER, FERRITIN, LDH, CRP in the last 72 hours.   Lab Results  Component Value Date   SARSCOV2NAA NEGATIVE 02/28/2021   [place] NEGATIVE 11/22/2020   [place] NEGATIVE 09/27/2020   [place] NEGATIVE 08/29/2020     Pressure Injury 02/28/21 Coccyx Mid;Lower Stage 4 - Full thickness tissue loss with exposed bone, tendon or muscle. pink wound, necrotic tissue present, drainage around sacral wound with moderate serosanguineous drainage. (Active)  02/28/21 1050  Location: Coccyx  Location Orientation: Mid;Lower  Staging: Stage 4 - Full thickness tissue loss with exposed bone, tendon or muscle.  Wound Description (Comments): pink wound, necrotic tissue present, drainage around sacral wound with moderate serosanguineous drainage.  Present on Admission: Yes        Recent Results (from the past 240 hour(s))  Resp Panel by RT-PCR (Flu A&B, Covid) Nasopharyngeal Swab     Status: None   Collection Time: 02/28/21  1:55 AM   Specimen: Nasopharyngeal Swab; Nasopharyngeal(NP) swabs in vial transport medium   Result Value Ref Range Status   SARS Coronavirus 2 by RT PCR NEGATIVE NEGATIVE Final    Comment: (NOTE) SARS-CoV-2 target nucleic acids are NOT DETECTED.  The SARS-CoV-2 RNA is generally detectable in upper respiratory specimens during the acute phase of infection. The lowest concentration of SARS-CoV-2 viral copies this assay can detect is 138 copies/mL. A negative result does not preclude SARS-Cov-2 infection and should not be used as the sole basis for treatment or other patient management decisions. A negative result may occur with  improper specimen collection/handling, submission of specimen other than nasopharyngeal swab, presence of viral mutation(s) within the areas targeted by this assay, and inadequate number of viral copies(<138 copies/mL). A negative result must be combined with clinical observations, patient history, and epidemiological information. The expected result is Negative.  Fact Sheet for Patients:  EntrepreneurPulse.com.au  Fact Sheet for Healthcare Providers:  IncredibleEmployment.be  This test is no t yet approved or cleared by the [place] FDA and  has been authorized for detection and/or diagnosis of SARS-CoV-2 by FDA under an Emergency Use Authorization (EUA). This EUA will remain  in effect (meaning this test can be used) for the duration of the COVID-19 declaration under Section 564(b)(1) of the Act, 21 U.S.C.section 360bbb-3(b)(1), unless the authorization is terminated  or revoked sooner.       Influenza A by PCR NEGATIVE NEGATIVE Final   Influenza B by PCR NEGATIVE NEGATIVE Final    Comment: (NOTE) The Xpert Xpress SARS-CoV-2/FLU/RSV plus assay is intended as an aid in the diagnosis of influenza from Nasopharyngeal swab specimens and should not be used as a sole basis for treatment. Nasal washings and aspirates are unacceptable for Xpert Xpress SARS-CoV-2/FLU/RSV testing.  Fact Sheet for  Patients: EntrepreneurPulse.com.au  Fact Sheet for Healthcare Providers: IncredibleEmployment.be  This test is not yet approved or cleared by the [place] FDA and has been authorized for detection and/or diagnosis of SARS-CoV-2 by FDA under an Emergency Use Authorization (EUA). This EUA will remain in effect (meaning this test can be used) for the duration of the COVID-19 declaration under Section 564(b)(1) of the Act, 21 U.S.C. section 360bbb-3(b)(1), unless the authorization is terminated or revoked.  Performed at [facility], [place] [address] [place], [place] [address]   Culture, blood (routine x 2)     Status: Abnormal (Preliminary result)   Collection Time: 03/01/21  9:45 AM   Specimen: BLOOD  Result Value Ref Range Status   Specimen Description BLOOD RIGHT ANTECUBITAL  Final   Special Requests   Final    BOTTLES DRAWN AEROBIC ONLY Blood Culture adequate volume   Culture  Setup Time   Final    GRAM POSITIVE COCCI IN CLUSTERS AEROBIC BOTTLE ONLY CRITICAL VALUE NOTED.  VALUE IS CONSISTENT WITH PREVIOUSLY REPORTED AND CALLED VALUE.    Culture (A)  Final    STAPHYLOCOCCUS AUREUS CULTURE REINCUBATED FOR BETTER GROWTH Performed at [facility], [place] [address] [place], [place] [address]    Report Status PENDING  Incomplete  Culture, blood (routine x 2)     Status: Abnormal (Preliminary result)   Collection Time: 03/01/21  9:59 AM   Specimen:  BLOOD  Result Value Ref Range Status   Specimen Description BLOOD LEFT ANTECUBITAL  Final   Special Requests   Final    BOTTLES DRAWN AEROBIC AND ANAEROBIC Blood Culture adequate volume   Culture  Setup Time   Final    GRAM POSITIVE COCCI IN CLUSTERS IN BOTH AEROBIC AND ANAEROBIC BOTTLES CRITICAL RESULT CALLED TO, READ BACK BY AND VERIFIED WITH: V BRYK,[email or web address]  03/02/21 [place]    Culture (A)  Final    STAPHYLOCOCCUS AUREUS SUSCEPTIBILITIES TO FOLLOW Performed at [facility], [place] [address] [place], [place] [address]    Report Status PENDING  Incomplete  Blood Culture ID Panel (Reflexed)     Status: Abnormal   Collection Time: 03/01/21  9:59 AM  Result Value Ref Range Status   Enterococcus faecalis NOT DETECTED NOT DETECTED Final   Enterococcus Faecium NOT DETECTED NOT DETECTED Final   Listeria monocytogenes NOT DETECTED NOT DETECTED Final   Staphylococcus species DETECTED (A) NOT DETECTED Final    Comment: CRITICAL RESULT CALLED TO, READ BACK BY AND VERIFIED WITH: V BRYK,[email or web address]  03/02/21 [place]    Staphylococcus aureus (BCID) DETECTED (A) NOT DETECTED Final    Comment: Methicillin (oxacillin)-resistant Staphylococcus aureus (MRSA). MRSA is predictably resistant to beta-lactam antibiotics (except ceftaroline). Preferred therapy is vancomycin unless clinically contraindicated. Patient requires contact precautions if  hospitalized. CRITICAL RESULT CALLED TO, READ BACK BY AND VERIFIED WITH: V BRYK,[email or web address]  03/02/21 [place]    Staphylococcus epidermidis NOT DETECTED NOT DETECTED Final   Staphylococcus lugdunensis NOT DETECTED NOT DETECTED Final   Streptococcus species NOT DETECTED NOT DETECTED Final   Streptococcus agalactiae NOT DETECTED NOT DETECTED Final   Streptococcus pneumoniae NOT DETECTED NOT DETECTED Final   Streptococcus pyogenes NOT DETECTED NOT DETECTED Final   A.calcoaceticus-baumannii NOT DETECTED NOT DETECTED Final   Bacteroides fragilis NOT DETECTED NOT DETECTED Final   Enterobacterales NOT DETECTED NOT DETECTED Final   Enterobacter cloacae complex NOT DETECTED NOT DETECTED Final   Escherichia coli NOT DETECTED NOT DETECTED Final   Klebsiella aerogenes NOT DETECTED NOT DETECTED Final   Klebsiella oxytoca NOT DETECTED NOT DETECTED Final   Klebsiella pneumoniae NOT DETECTED NOT DETECTED Final   Proteus species NOT DETECTED NOT DETECTED Final   Salmonella species NOT DETECTED NOT DETECTED Final   Serratia marcescens NOT DETECTED NOT  DETECTED Final   Haemophilus influenzae NOT DETECTED NOT DETECTED Final   Neisseria meningitidis NOT DETECTED NOT DETECTED Final   Pseudomonas aeruginosa NOT DETECTED NOT DETECTED Final   Stenotrophomonas maltophilia NOT DETECTED NOT DETECTED Final   Candida albicans NOT DETECTED NOT DETECTED Final   Candida auris NOT DETECTED NOT DETECTED Final   Candida glabrata NOT DETECTED NOT DETECTED Final   Candida krusei NOT DETECTED NOT DETECTED Final   Candida parapsilosis NOT DETECTED NOT DETECTED Final   Candida tropicalis NOT DETECTED NOT DETECTED Final   Cryptococcus neoformans/gattii NOT DETECTED NOT DETECTED Final   Meth resistant mecA/C and MREJ DETECTED (A) NOT DETECTED Final    Comment: CRITICAL RESULT CALLED TO, READ BACK BY AND VERIFIED WITH: V [email or web address],[email or web address]  03/02/21 [place] Performed at [facility], [address] [place], [place] [address]   Culture, blood (routine x 2)     Status: None (Preliminary result)   Collection Time: 03/03/21  4:33 AM   Specimen: BLOOD RIGHT HAND  Result Value Ref Range Status   Specimen Description BLOOD RIGHT HAND  Final   Special Requests   Final    BOTTLES DRAWN AEROBIC ONLY  Blood Culture results may not be optimal due to an inadequate volume of blood received in culture bottles   Culture   Final    NO GROWTH 1 DAY Performed at [facility] [address] [place], [place] [address]    Report Status PENDING  Incomplete  Culture, blood (routine x 2)     Status: None (Preliminary result)   Collection Time: 03/03/21  4:58 AM   Specimen: BLOOD LEFT HAND  Result Value Ref Range Status   Specimen Description BLOOD LEFT HAND  Final   Special Requests   Final    BOTTLES DRAWN AEROBIC ONLY Blood Culture results may not be optimal due to an inadequate volume of blood received in culture bottles   Culture  Setup Time   Final    GRAM POSITIVE COCCI IN CLUSTERS AEROBIC BOTTLE ONLY CRITICAL VALUE NOTED.  VALUE IS CONSISTENT WITH PREVIOUSLY  REPORTED AND CALLED VALUE.    Culture   Final    NO GROWTH 1 DAY Performed at [facility], [place] [address], [place], [place] [address]    Report Status PENDING  Incomplete    Oswald Hillock   [facility] If 7PM-7AM, please contact night-coverage at [email or web address], Office  [phone]   03/04/2021, 8:43 AM  LOS: 4 days

## 2021-03-04 NOTE — Progress Notes (Signed)
SLP Cancellation Note  Patient Details Name: VAANI MORREN MRN: [id number] DOB: April 06, 1961   Cancelled treatment:   NPO for procedure.  SLP will follow.  Daisi Kentner L. Tivis Ringer, [place] CCC/SLP Acute Rehabilitation Services Office number [phone] Pager [phone]         Juan Quam Laurice 03/04/2021, 11:45 AM

## 2021-03-04 NOTE — Progress Notes (Addendum)
Pt critical LAB Platelet 9  and Critical K 2.4 MD V.Rathore was page by [employer].

## 2021-03-04 NOTE — Procedures (Signed)
Interventional Radiology Procedure Note  Procedure:   US guided aspiration of right Real septic joint.  ~70cc of pus aspirated. Cx sent .  Complications: None Recommendations:  - Follow up culture - Do not submerge for 7 days    Signed,  Dulcy Fanny. Earleen Newport, DO

## 2021-03-05 DIAGNOSIS — J9601 Acute respiratory failure with hypoxia: Secondary | ICD-10-CM | POA: Diagnosis not present

## 2021-03-05 DIAGNOSIS — L02213 Cutaneous abscess of chest wall: Secondary | ICD-10-CM | POA: Diagnosis not present

## 2021-03-05 DIAGNOSIS — Z66 Do not resuscitate: Secondary | ICD-10-CM | POA: Diagnosis not present

## 2021-03-05 DIAGNOSIS — D8689 Sarcoidosis of other sites: Secondary | ICD-10-CM

## 2021-03-05 DIAGNOSIS — R0602 Shortness of breath: Secondary | ICD-10-CM | POA: Diagnosis not present

## 2021-03-05 DIAGNOSIS — I10 Essential (primary) hypertension: Secondary | ICD-10-CM | POA: Diagnosis not present

## 2021-03-05 DIAGNOSIS — Z7189 Other specified counseling: Secondary | ICD-10-CM

## 2021-03-05 DIAGNOSIS — D696 Thrombocytopenia, unspecified: Secondary | ICD-10-CM

## 2021-03-05 DIAGNOSIS — L89150 Pressure ulcer of sacral region, unstageable: Secondary | ICD-10-CM | POA: Diagnosis not present

## 2021-03-05 DIAGNOSIS — B9562 Methicillin resistant Staphylococcus aureus infection as the cause of diseases classified elsewhere: Secondary | ICD-10-CM | POA: Diagnosis not present

## 2021-03-05 DIAGNOSIS — R7881 Bacteremia: Secondary | ICD-10-CM | POA: Diagnosis not present

## 2021-03-05 LAB — GLUCOSE, CAPILLARY
Glucose-Capillary: 212 mg/dL — ABNORMAL HIGH (ref 70–99)
Glucose-Capillary: 231 mg/dL — ABNORMAL HIGH (ref 70–99)
Glucose-Capillary: 272 mg/dL — ABNORMAL HIGH (ref 70–99)
Glucose-Capillary: 285 mg/dL — ABNORMAL HIGH (ref 70–99)

## 2021-03-05 LAB — COMPREHENSIVE METABOLIC PANEL
ALT: 60 U/L — ABNORMAL HIGH (ref 0–44)
AST: 52 U/L — ABNORMAL HIGH (ref 15–41)
Albumin: <1.5 g/dL — ABNORMAL LOW (ref 3.5–5.0)
Alkaline Phosphatase: 489 U/L — ABNORMAL HIGH (ref 38–126)
Anion gap: 11 (ref 5–15)
BUN: 11 mg/dL (ref 6–20)
CO2: 27 mmol/L (ref 22–32)
Calcium: 7.2 mg/dL — ABNORMAL LOW (ref 8.9–10.3)
Chloride: 104 mmol/L (ref 98–111)
Creatinine, Ser: 0.65 mg/dL (ref 0.44–1.00)
GFR, Estimated: >60 mL/min (ref >60)
Glucose, Bld: 186 mg/dL — ABNORMAL HIGH (ref 70–99)
Potassium: 3.7 mmol/L (ref 3.5–5.1)
Sodium: 142 mmol/L (ref 135–145)
Total Bilirubin: 1.9 mg/dL — ABNORMAL HIGH (ref 0.3–1.2)
Total Protein: 4.1 g/dL — ABNORMAL LOW (ref 6.5–8.1)

## 2021-03-05 LAB — CBC
HCT: 29 % — ABNORMAL LOW (ref 36.0–46.0)
Hemoglobin: 9.5 g/dL — ABNORMAL LOW (ref 12.0–15.0)
MCH: 28.4 pg (ref 26.0–34.0)
MCHC: 32.8 g/dL (ref 30.0–36.0)
MCV: 86.8 fL (ref 80.0–100.0)
Platelets: 8 10*3/uL — CL (ref 150–400)
RBC: 3.34 MIL/uL — ABNORMAL LOW (ref 3.87–5.11)
RDW: 16.5 % — ABNORMAL HIGH (ref 11.5–15.5)
WBC: 7.7 10*3/uL (ref 4.0–10.5)
nRBC: 0.8 % — ABNORMAL HIGH (ref 0.0–0.2)

## 2021-03-05 LAB — VITAMIN C: Vitamin C: 0.3 mg/dL — ABNORMAL LOW (ref 0.4–2.0)

## 2021-03-05 MED ORDER — SALINE SPRAY 0.65 % NA SOLN
1.0000 | NASAL | Status: DC | PRN
  Administered 2021-03-05: 1 via NASAL
  Filled 2021-03-05: qty 44

## 2021-03-05 MED ORDER — CHLORHEXIDINE GLUCONATE CLOTH 2 % EX PADS
6.0000 | MEDICATED_PAD | Freq: Every day | CUTANEOUS | Status: DC
  Administered 2021-03-05 – 2021-03-08 (×4): 6 via TOPICAL

## 2021-03-05 MED ORDER — MUPIROCIN 2 % EX OINT
TOPICAL_OINTMENT | Freq: Two times a day (BID) | CUTANEOUS | Status: DC
  Administered 2021-03-08: 1 via NASAL
  Filled 2021-03-05: qty 22

## 2021-03-05 NOTE — Progress Notes (Signed)
INFECTIOUS DISEASE PROGRESS NOTE  ID: Rhonda [name] is a 60 y.o. female with  Principal Problem:   Shortness of breath Active Problems:   Essential hypertension   OSA (obstructive sleep apnea)   HLD (hyperlipidemia)   Neurosarcoidosis   Type 2 diabetes mellitus without complication, with long-term current use of insulin (HCC)   Unstageable pressure ulcer of sacral region ([place])   Hypokalemia   DNR (do not resuscitate)   Malnutrition of moderate degree   Chest wall abscess   MRSA bacteremia  Subjective: Awakens transiently.  Per family, asking for water.   Abtx:  Anti-infectives (From admission, onward)    Start     Dose/Rate Route Frequency Ordered Stop   03/02/21 1800  Ampicillin-Sulbactam (UNASYN) 3 g in sodium chloride 0.9 % 100 mL IVPB        3 g 200 mL/hr over 30 Minutes Intravenous Every 6 hours 03/02/21 1652     03/02/21 1200  vancomycin (VANCOCIN) IVPB 1000 mg/200 mL premix        1,000 mg 200 mL/hr over 60 Minutes Intravenous Every 24 hours 03/01/21 1326     03/01/21 1230  vancomycin (VANCOREADY) IVPB 1750 mg/350 mL        1,750 mg 175 mL/hr over 120 Minutes Intravenous  Once 03/01/21 1123 03/01/21 1459   03/01/21 1200  piperacillin-tazobactam (ZOSYN) IVPB 3.375 g  Status:  Discontinued        3.375 g 12.5 mL/hr over 240 Minutes Intravenous Every 8 hours 03/01/21 1123 03/02/21 0021       Medications: Scheduled:  atorvastatin  80 mg Oral q1800   chlorhexidine  15 mL Mouth Rinse BID   collagenase   Topical BID   feeding supplement (GLUCERNA SHAKE)  237 mL Oral BID BM   ferrous sulfate  325 mg Oral Q breakfast   gabapentin  100 mg Oral TID   hydrocortisone sod succinate (SOLU-CORTEF) inj  100 mg Intravenous Q8H   insulin aspart  0-15 Units Subcutaneous TID WC   insulin aspart  0-5 Units Subcutaneous QHS   insulin glargine-yfgn  10 Units Subcutaneous QHS   mouth rinse  15 mL Mouth Rinse q12n4p   melatonin  10 mg Oral QHS   multivitamin with minerals  1  tablet Oral Daily   nystatin  5 mL Oral QID   pantoprazole  40 mg Oral Daily   silver nitrate applicators  1 application Topical Once   sodium chloride flush  3 mL Intravenous Q12H    Objective: Vital signs in last 24 hours: Temp:  [98.2 F (36.8 C)-99.3 F (37.4 C)] 98.4 F (36.9 C) (10/18 0428) Pulse Rate:  [95-112] 95 (10/17 1700) Resp:  [16] 16 (10/18 0428) BP: (121-136)/(80-91) 126/80 (10/18 0428) SpO2:  [99 %-100 %] 99 % (10/18 0428)   General appearance: alert and fatigued Neck: decreased edema R neck.  Resp: clear to auscultation bilaterally Chest wall: no tenderness, decreased edema R neck/chest wall.  Cardio: regular rate and rhythm GI: normal findings: bowel sounds normal and soft, non-tender  Lab Results Recent Labs    03/04/21 0415 03/05/21 0306  WBC 6.7 7.7  HGB 9.3* 9.5*  HCT 29.4* 29.0*  NA 137 142  K 2.4* 3.7  CL 101 104  CO2 28 27  BUN 13 11  CREATININE 0.70 0.65   Liver Panel Recent Labs    03/05/21 0306  PROT 4.1*  ALBUMIN <1.5*  AST 52*  ALT 60*  ALKPHOS 489*  BILITOT 1.9*  Sedimentation Rate No results for input(s): ESRSEDRATE in the last 72 hours. C-Reactive Protein No results for input(s): CRP in the last 72 hours.  Microbiology: Recent Results (from the past 240 hour(s))  Resp Panel by RT-PCR (Flu A&B, Covid) Nasopharyngeal Swab     Status: None   Collection Time: 02/28/21  1:55 AM   Specimen: Nasopharyngeal Swab; Nasopharyngeal(NP) swabs in vial transport medium  Result Value Ref Range Status   SARS Coronavirus 2 by RT PCR NEGATIVE NEGATIVE Final    Comment: (NOTE) SARS-CoV-2 target nucleic acids are NOT DETECTED.  The SARS-CoV-2 RNA is generally detectable in upper respiratory specimens during the acute phase of infection. The lowest concentration of SARS-CoV-2 viral copies this assay can detect is 138 copies/mL. A negative result does not preclude SARS-Cov-2 infection and should not be used as the sole basis for  treatment or other patient management decisions. A negative result may occur with  improper specimen collection/handling, submission of specimen other than nasopharyngeal swab, presence of viral mutation(s) within the areas targeted by this assay, and inadequate number of viral copies(<138 copies/mL). A negative result must be combined with clinical observations, patient history, and epidemiological information. The expected result is Negative.  Fact Sheet for Patients:  EntrepreneurPulse.com.au  Fact Sheet for Healthcare Providers:  IncredibleEmployment.be  This test is no t yet approved or cleared by the [place] FDA and  has been authorized for detection and/or diagnosis of SARS-CoV-2 by FDA under an Emergency Use Authorization (EUA). This EUA will remain  in effect (meaning this test can be used) for the duration of the COVID-19 declaration under Section 564(b)(1) of the Act, 21 U.S.C.section 360bbb-3(b)(1), unless the authorization is terminated  or revoked sooner.       Influenza A by PCR NEGATIVE NEGATIVE Final   Influenza B by PCR NEGATIVE NEGATIVE Final    Comment: (NOTE) The Xpert Xpress SARS-CoV-2/FLU/RSV plus assay is intended as an aid in the diagnosis of influenza from Nasopharyngeal swab specimens and should not be used as a sole basis for treatment. Nasal washings and aspirates are unacceptable for Xpert Xpress SARS-CoV-2/FLU/RSV testing.  Fact Sheet for Patients: EntrepreneurPulse.com.au  Fact Sheet for Healthcare Providers: IncredibleEmployment.be  This test is not yet approved or cleared by the [place] FDA and has been authorized for detection and/or diagnosis of SARS-CoV-2 by FDA under an Emergency Use Authorization (EUA). This EUA will remain in effect (meaning this test can be used) for the duration of the COVID-19 declaration under Section 564(b)(1) of the Act, 21  U.S.C. section 360bbb-3(b)(1), unless the authorization is terminated or revoked.  Performed at [facility], [place] [address], [place], [place] [address]   Culture, blood (routine x 2)     Status: Abnormal   Collection Time: 03/01/21  9:45 AM   Specimen: BLOOD  Result Value Ref Range Status   Specimen Description BLOOD RIGHT ANTECUBITAL  Final   Special Requests   Final    BOTTLES DRAWN AEROBIC ONLY Blood Culture adequate volume   Culture  Setup Time   Final    GRAM POSITIVE COCCI IN CLUSTERS AEROBIC BOTTLE ONLY CRITICAL VALUE NOTED.  VALUE IS CONSISTENT WITH PREVIOUSLY REPORTED AND CALLED VALUE.    Culture (A)  Final    STAPHYLOCOCCUS AUREUS SUSCEPTIBILITIES PERFORMED ON PREVIOUS CULTURE WITHIN THE LAST 5 DAYS. Performed at [facility], [place] [address] [place], [place] [address]    Report Status 03/04/2021 FINAL  Final  Culture, blood (routine x 2)  Status: Abnormal   Collection Time: 03/01/21  9:59 AM   Specimen: BLOOD  Result Value Ref Range Status   Specimen Description BLOOD LEFT ANTECUBITAL  Final   Special Requests   Final    BOTTLES DRAWN AEROBIC AND ANAEROBIC Blood Culture adequate volume   Culture  Setup Time   Final    GRAM POSITIVE COCCI IN CLUSTERS IN BOTH AEROBIC AND ANAEROBIC BOTTLES CRITICAL RESULT CALLED TO, READ BACK BY AND VERIFIED WITH: V BRYK,PHARMD@0010  03/02/21 [place] Performed at [facility], [place] [address] [place], [place] [address]    Culture METHICILLIN RESISTANT STAPHYLOCOCCUS AUREUS (A)  Final   Report Status 03/04/2021 FINAL  Final   Organism ID, Bacteria METHICILLIN RESISTANT STAPHYLOCOCCUS AUREUS  Final      Susceptibility   Methicillin resistant staphylococcus aureus - MIC*    CIPROFLOXACIN <=0.5 SENSITIVE Sensitive     ERYTHROMYCIN >=8 RESISTANT Resistant     GENTAMICIN <=0.5 SENSITIVE Sensitive     OXACILLIN >=4 RESISTANT Resistant     TETRACYCLINE <=1 SENSITIVE Sensitive     VANCOMYCIN 1 SENSITIVE Sensitive      TRIMETH/SULFA <=10 SENSITIVE Sensitive     CLINDAMYCIN <=0.25 SENSITIVE Sensitive     RIFAMPIN <=0.5 SENSITIVE Sensitive     Inducible Clindamycin NEGATIVE Sensitive     * METHICILLIN RESISTANT STAPHYLOCOCCUS AUREUS  Blood Culture ID Panel (Reflexed)     Status: Abnormal   Collection Time: 03/01/21  9:59 AM  Result Value Ref Range Status   Enterococcus faecalis NOT DETECTED NOT DETECTED Final   Enterococcus Faecium NOT DETECTED NOT DETECTED Final   Listeria monocytogenes NOT DETECTED NOT DETECTED Final   Staphylococcus species DETECTED (A) NOT DETECTED Final    Comment: CRITICAL RESULT CALLED TO, READ BACK BY AND VERIFIED WITH: V BRYK,PHARMD@0010  03/02/21 [place]    Staphylococcus aureus (BCID) DETECTED (A) NOT DETECTED Final    Comment: Methicillin (oxacillin)-resistant Staphylococcus aureus (MRSA). MRSA is predictably resistant to beta-lactam antibiotics (except ceftaroline). Preferred therapy is vancomycin unless clinically contraindicated. Patient requires contact precautions if  hospitalized. CRITICAL RESULT CALLED TO, READ BACK BY AND VERIFIED WITH: V BRYK,PHARMD@0010  03/02/21 [place]    Staphylococcus epidermidis NOT DETECTED NOT DETECTED Final   Staphylococcus lugdunensis NOT DETECTED NOT DETECTED Final   Streptococcus species NOT DETECTED NOT DETECTED Final   Streptococcus agalactiae NOT DETECTED NOT DETECTED Final   Streptococcus pneumoniae NOT DETECTED NOT DETECTED Final   Streptococcus pyogenes NOT DETECTED NOT DETECTED Final   A.calcoaceticus-baumannii NOT DETECTED NOT DETECTED Final   Bacteroides fragilis NOT DETECTED NOT DETECTED Final   Enterobacterales NOT DETECTED NOT DETECTED Final   Enterobacter cloacae complex NOT DETECTED NOT DETECTED Final   Escherichia coli NOT DETECTED NOT DETECTED Final   Klebsiella aerogenes NOT DETECTED NOT DETECTED Final   Klebsiella oxytoca NOT DETECTED NOT DETECTED Final   Klebsiella pneumoniae NOT DETECTED NOT DETECTED Final   Proteus species  NOT DETECTED NOT DETECTED Final   Salmonella species NOT DETECTED NOT DETECTED Final   Serratia marcescens NOT DETECTED NOT DETECTED Final   Haemophilus influenzae NOT DETECTED NOT DETECTED Final   Neisseria meningitidis NOT DETECTED NOT DETECTED Final   Pseudomonas aeruginosa NOT DETECTED NOT DETECTED Final   Stenotrophomonas maltophilia NOT DETECTED NOT DETECTED Final   Candida albicans NOT DETECTED NOT DETECTED Final   Candida auris NOT DETECTED NOT DETECTED Final   Candida glabrata NOT DETECTED NOT DETECTED Final   Candida krusei NOT DETECTED NOT DETECTED Final   Candida parapsilosis NOT DETECTED  NOT DETECTED Final   Candida tropicalis NOT DETECTED NOT DETECTED Final   Cryptococcus neoformans/gattii NOT DETECTED NOT DETECTED Final   Meth resistant mecA/C and MREJ DETECTED (A) NOT DETECTED Final    Comment: CRITICAL RESULT CALLED TO, READ BACK BY AND VERIFIED WITH: V BRYK,PHARMD@0010  03/02/21 [place] Performed at [facility], [place] [address] [place], [place] [address]   Culture, blood (routine x 2)     Status: None (Preliminary result)   Collection Time: 03/03/21  4:33 AM   Specimen: BLOOD RIGHT HAND  Result Value Ref Range Status   Specimen Description BLOOD RIGHT HAND  Final   Special Requests   Final    BOTTLES DRAWN AEROBIC ONLY Blood Culture results may not be optimal due to an inadequate volume of blood received in culture bottles   Culture  Setup Time   Final    GRAM POSITIVE COCCI IN CLUSTERS AEROBIC BOTTLE ONLY CRITICAL VALUE NOTED.  VALUE IS CONSISTENT WITH PREVIOUSLY REPORTED AND CALLED VALUE.    Culture   Final    NO GROWTH 1 DAY Performed at [facility], [place] [address], [place], [place] [address]    Report Status PENDING  Incomplete  Culture, blood (routine x 2)     Status: None (Preliminary result)   Collection Time: 03/03/21  4:58 AM   Specimen: BLOOD LEFT HAND  Result Value Ref Range Status   Specimen Description BLOOD LEFT HAND  Final   Special  Requests   Final    BOTTLES DRAWN AEROBIC ONLY Blood Culture results may not be optimal due to an inadequate volume of blood received in culture bottles   Culture  Setup Time   Final    GRAM POSITIVE COCCI IN CLUSTERS AEROBIC BOTTLE ONLY CRITICAL VALUE NOTED.  VALUE IS CONSISTENT WITH PREVIOUSLY REPORTED AND CALLED VALUE.    Culture   Final    NO GROWTH 1 DAY Performed at [facility], [place] [address], [place], [place] [address]    Report Status PENDING  Incomplete  MRSA Next Gen by PCR, Nasal     Status: Abnormal   Collection Time: 03/04/21 11:27 AM   Specimen: Nasal Mucosa; Nasal Swab  Result Value Ref Range Status   MRSA by PCR Next Gen DETECTED (A) NOT DETECTED Final    Comment: RESULT CALLED TO, READ BACK BY AND VERIFIED WITH: RN S MIGANTOS [id number] AT 1311 BY CM (NOTE) The GeneXpert MRSA Assay (FDA approved for NASAL specimens only), is one component of a comprehensive MRSA colonization surveillance program. It is not intended to diagnose MRSA infection nor to guide or monitor treatment for MRSA infections. Test performance is not FDA approved in patients less than 45 years old. Performed at [facility], [place] [address] [place], [place] [address]   Aerobic/Anaerobic Culture w Gram Stain (surgical/deep wound)     Status: None (Preliminary result)   Collection Time: 03/04/21  5:20 PM   Specimen: Abscess  Result Value Ref Range Status   Specimen Description ABSCESS RIGHT NECK  Final   Special Requests NONE  Final   Gram Stain   Final    ABUNDANT WBC PRESENT, PREDOMINANTLY MONONUCLEAR MODERATE GRAM POSITIVE COCCI IN PAIRS AND CHAINS FEW GRAM POSITIVE COCCI IN CLUSTERS Performed at [facility], [place] [address] [place], [place] [address]    Culture PENDING  Incomplete   Report Status PENDING  Incomplete    Studies/Results: MR CHEST W WO CONTRAST  Result  Date: 03/03/2021 CLINICAL DATA:  Evaluate abscess EXAM: MRI CHEST WITHOUT AND WITH CONTRAST  TECHNIQUE: Multiplanar, multisequence MR examination the upper right chest centered about the right sternoclavicular joint was performed both prior to and following the uncomplicated IV administration gadolinium contrast. CONTRAST:  7.33mL GADAVIST GADOBUTROL 1 MMOL/ML IV SOLN COMPARISON:  CT chest angiogram, 02/28/2021 FINDINGS: Limited mediastinum: Unremarkable. Limited lungs and pleura: Unremarkable. Musculoskeletal: Large, rim enhancing fluid collection centered about the right sternoclavicular joint, measuring 5.8 x 4.2 cm. There is no overt bony destruction or bone marrow edema (series 16, image 15). Additional, likely contiguous air and fluid collection with similar rim enhancement just superior to the sternal notch, incompletely imaged on this examination measuring approximately 6.3 x 4.0 cm (series 16, image 9). IMPRESSION: 1. Large, rim enhancing fluid collection centered about the right sternoclavicular joint, measuring 5.8 x 4.2 cm. 2. Additional, likely contiguous air and fluid collection with similar rim enhancement just superior to the sternal notch, incompletely imaged on this examination measuring approximately 6.3 x 4.0 cm. 3. No overt bony destruction or associated bone marrow edema. 4. Findings are most consistent with septic arthritis. Consider percutaneous fluid sampling. Electronically Signed   By: Delanna Ahmadi M.D.   On: 03/03/2021 15:07   IR US Guide Bx Asp/Drain  Result Date: 03/04/2021 INDICATION: 60 year old female referred for aspiration right sternoclavicular abscess/septic joint EXAM: ULTRASOUND-GUIDED ASPIRATION MEDICATIONS: None ANESTHESIA/SEDATION: None . COMPLICATIONS: None PROCEDURE: Informed written consent was obtained from the patient after a thorough discussion of the procedural risks, benefits and alternatives. All questions were addressed. Maximal Sterile Barrier Technique was utilized including caps, mask, sterile gowns, sterile gloves, sterile drape, hand hygiene and  skin antiseptic. A timeout was performed prior to the initiation of the procedure. Ultrasound images of the right sternoclavicular joint were performed with images stored and sent to PACs. The patient is prepped and draped in the usual sterile fashion. 1% lidocaine was used for local anesthesia. Using ultrasound guidance, a Yueh needle was advanced into the fluid collection associated with the right sternoclavicular joint. Approximately 70 cc of frankly purulent material aspirated. Culture was sent. Final image was stored. Sterile bandage was placed. Patient tolerated the procedure well and remained hemodynamically stable throughout. No complications were encountered and no significant blood loss. IMPRESSION: Status post ultrasound-guided aspiration of right sternoclavicular septic joint. Signed, Dulcy Fanny. Dellia Nims, RPVI [facility] Electronically Signed   By: Corrie Mckusick D.O.   On: 03/04/2021 17:38   ECHOCARDIOGRAM COMPLETE  Result Date: 03/03/2021    ECHOCARDIOGRAM REPORT   Patient Name:   Rhonda [name] [facility] Date of Exam: 03/03/2021 Medical Rec #:  [id number]      Height:       63.0 in Accession #:    [id number]     Weight:       178.0 lb Date of Birth:  1960-07-24      BSA:          1.840 m Patient Age:    60 years       BP:           132/70 mmHg Patient Gender: F              HR:           92 bpm. Exam Location:  Inpatient Procedure: 2D Echo, Cardiac Doppler and Color Doppler Indications:    Bacteremia  History:        Patient has prior history of Echocardiogram  examinations, most                 recent 01/15/2020. TIA, Signs/Symptoms:Shortness of Breath; Risk                 Factors:Hypertension and Diabetes.  Sonographer:    Wenda Low Referring Phys: [id number] [place]  1. Left ventricular ejection fraction, by estimation, is 50 to 55% with beat to beat variability. The left ventricle has low normal function. Left ventricular  endocardial border not optimally defined to evaluate regional wall motion. There is mild left ventricular hypertrophy. Indeterminate diastolic filling due to E-A fusion.  2. Right ventricular systolic function is normal. The right ventricular size is normal. There is normal pulmonary artery systolic pressure. The estimated right ventricular systolic pressure is 67.8 mmHg.  3. A small pericardial effusion is present. The pericardial effusion is posterior to the left ventricle.  4. The mitral valve is normal in structure. Trivial mitral valve regurgitation. No evidence of mitral stenosis.  5. The aortic valve is tricuspid. There is mild calcification of the aortic valve. Aortic valve regurgitation is trivial. No aortic stenosis is present.  6. The inferior vena cava is normal in size with greater than 50% respiratory variability, suggesting right atrial pressure of 3 mmHg. Conclusion(s)/Recommendation(s): No evidence of valvular vegetations on this transthoracic echocardiogram. Consider a transesophageal echocardiogram to exclude infective endocarditis if clinically indicated. FINDINGS  Left Ventricle: Left ventricular ejection fraction, by estimation, is 50 to 55%. The left ventricle has low normal function. Left ventricular endocardial border not optimally defined to evaluate regional wall motion. The left ventricular internal cavity  size was normal in size. There is mild left ventricular hypertrophy. Indeterminate diastolic filling due to E-A fusion. Right Ventricle: The right ventricular size is normal. No increase in right ventricular wall thickness. Right ventricular systolic function is normal. There is normal pulmonary artery systolic pressure. The tricuspid regurgitant velocity is 2.78 m/s, and  with an assumed right atrial pressure of 3 mmHg, the estimated right ventricular systolic pressure is 93.8 mmHg. Left Atrium: Left atrial size was normal in size. Right Atrium: Right atrial size was normal in size.  Pericardium: A small pericardial effusion is present. The pericardial effusion is posterior to the left ventricle. Presence of pericardial fat pad. Mitral Valve: The mitral valve is normal in structure. Trivial mitral valve regurgitation. No evidence of mitral valve stenosis. MV peak gradient, 4.9 mmHg. The mean mitral valve gradient is 1.0 mmHg. Tricuspid Valve: The tricuspid valve is normal in structure. Tricuspid valve regurgitation is mild . No evidence of tricuspid stenosis. Aortic Valve: The aortic valve is tricuspid. There is mild calcification of the aortic valve. Aortic valve regurgitation is trivial. No aortic stenosis is present. Aortic valve mean gradient measures 5.7 mmHg. Aortic valve peak gradient measures 12.0 mmHg. Aortic valve area, by VTI measures 1.80 cm. Pulmonic Valve: The pulmonic valve was normal in structure. Pulmonic valve regurgitation is trivial. No evidence of pulmonic stenosis. Aorta: The aortic root is normal in size and structure. Venous: The inferior vena cava is normal in size with greater than 50% respiratory variability, suggesting right atrial pressure of 3 mmHg. IAS/Shunts: The interatrial septum was not well visualized.  LEFT VENTRICLE PLAX 2D LVIDd:         3.90 cm   Diastology LVIDs:         2.50 cm   LV e' medial:    5.22 cm/s LV PW:  1.20 cm   LV E/e' medial:  10.9 LV IVS:        1.10 cm   LV e' lateral:   3.81 cm/s LVOT diam:     2.00 cm   LV E/e' lateral: 15.0 LV SV:         49 LV SV Index:   26 LVOT Area:     3.14 cm  RIGHT VENTRICLE RV Basal diam:  2.75 cm RV Mid diam:    2.20 cm LEFT ATRIUM             Index        RIGHT ATRIUM           Index LA diam:        2.60 cm 1.41 cm/m   RA Area:     11.10 cm LA Vol (A2C):   35.9 ml 19.51 ml/m  RA Volume:   22.50 ml  12.23 ml/m LA Vol (A4C):   47.9 ml 26.03 ml/m LA Biplane Vol: 43.8 ml 23.80 ml/m  AORTIC VALVE                     PULMONIC VALVE AV Area (Vmax):    1.92 cm      PV Vmax:       0.76 m/s AV Area  (Vmean):   1.81 cm      PV Peak grad:  2.3 mmHg AV Area (VTI):     1.80 cm AV Vmax:           173.55 cm/s AV Vmean:          109.859 cm/s AV VTI:            0.271 m AV Peak Grad:      12.0 mmHg AV Mean Grad:      5.7 mmHg LVOT Vmax:         106.00 cm/s LVOT Vmean:        63.300 cm/s LVOT VTI:          0.155 m LVOT/AV VTI ratio: 0.57  AORTA Ao Root diam: 3.00 cm MITRAL VALVE                TRICUSPID VALVE MV Area (PHT): 4.44 cm     TR Peak grad:   30.9 mmHg MV Area VTI:   1.80 cm     TR Vmax:        278.00 cm/s MV Peak grad:  4.9 mmHg MV Mean grad:  1.0 mmHg     SHUNTS MV Vmax:       1.11 m/s     Systemic VTI:  0.16 m MV Vmean:      53.6 cm/s    Systemic Diam: 2.00 cm MV Decel Time: 171 msec MV E velocity: 57.00 cm/s MV A velocity: 111.00 cm/s MV E/A ratio:  0.51 Cherlynn Kaiser MD Electronically signed by Cherlynn Kaiser MD Signature Date/Time: 03/03/2021/1:25:09 PM    Final      Assessment/Plan: Neurosarcoidosis Sacral decubitus (no osteo, recently debrided) MRSA bacteremia R chest wall abscess, Dawson joint Thrombocytopenia   Total days of antibiotics: vancomycin (10-14 -->)  Appreciate IR eval as well as ENT Aspirate with GPC In clusters.  Appreciate heme eval for PLT.  8 today Will discuss anbx change with pharm, vanco seems an unlikely culprit..... Palliative care eval.           Bobby Rumpf MD, FACP Infectious Diseases (pager) [phone] [email or web address] 03/05/2021, 8:16 AM  LOS: 5 days

## 2021-03-05 NOTE — Progress Notes (Signed)
HEMATOLOGY-ONCOLOGY PROGRESS NOTE  Thrombocytopenia Review of patient's lab work shows intermittent thrombocytopenia for the past 6 months The cause of her thrombocytopenia is due to sepsis, broad-spectrum IV antibiotics use, consumption as well as bone marrow suppression overall. Platelet count normal about 2 weeks ago and now with severe thrombocytopenia Not actively bleeding but due to have aspiration of her chest wall abscess We recommend 2 units of platelets prior to procedure and another unit postprocedure - did not receive transfusion and did not develop any bleeding post-procedure Her thrombocytopenia will recover slowly; consider changing IV antibiotics whenever is possible For now, I recommend platelet transfusion if platelet count is less than 10,000 or active bleeding   Severe sepsis/MRSA bacteremia ID following Repeat blood cultures sent 10/16 are again positive for Staph aureus IV antibiotics per ID/hospitalist   Chest wall abscess S/P aspiration of abscess 03/04/2021 Gram stain with moderate gram positive cocci in pairs and chain and few gram positive cocci in clusters; culture pending   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 Status postdebridement by general surgery CT pelvis showed regional cellulitis and soft tissue emphysema Wound care following   History of pulmonary embolism The patient was previously on Eliquis but this has been placed on hold due to thrombocytopenia Recommend that this continue to be held until platelet count is consistently 50,000 or greater   Type 2 diabetes mellitus Management per hospitalist   Neurosarcoidosis The patient is on steroids per hospitalist Palliative care consultation has been recommended   Hypertension Management per hospitalist   Obstructive sleep apnea Management per hospitalist  SUBJECTIVE: The patient's husband and son are at the bedside She is more alert today and was able to eat some  lunch She has not had any bleeding today No fevers Palliative care note has been reviewed and note plan to continue current mode of care with eventual discharge to home with hospice  REVIEW OF SYSTEMS:   Constitutional: Denies fevers Eyes: Denies blurriness of vision Ears, nose, mouth, throat, and face: Denies mucositis or sore throat Respiratory: Denies cough, dyspnea or wheezes Cardiovascular: Denies palpitation, chest discomfort Gastrointestinal:  Denies nausea, heartburn or change in bowel habits Skin: Bruises easily Neurological:Denies numbness, tingling or new weaknesses Behavioral/Psych: Mood is stable, no new changes  Extremities: No lower extremity edema All other systems were reviewed with the patient and are negative.  I have reviewed the past medical history, past surgical history, social history and family history with the patient and they are unchanged from previous note.   PHYSICAL EXAMINATION:  Vitals:   03/04/21 1950 03/05/21 0428  BP: 133/86 126/80  Pulse:    Resp:  16  Temp: 98.2 F (36.8 C) 98.4 F (36.9 C)  SpO2: 100% 99%   Filed Weights   02/27/21 2353  Weight: 80.7 kg    Intake/Output from previous day: 10/17 0701 - 10/18 0700 In: 2965 [I.V.:1743.8; IV Piggyback:1221.2] Out: 950 [Urine:950]  GENERAL:alert, no distress and comfortable SKIN: Multiple ecchymoses, petechiae on her arms LUNGS: clear to auscultation and percussion with normal breathing effort HEART: regular rate & rhythm and no murmurs and no lower extremity edema ABDOMEN:abdomen soft, non-tender and normal bowel sounds  NEURO: alert & oriented x 3 with fluent speech, no focal motor/sensory deficits  LABORATORY DATA:  I have reviewed the data as listed CMP Latest Ref Rng & Units 03/05/2021 03/04/2021 03/02/2021  Glucose 70 - 99 mg/dL 186(H) 113(H) 98  BUN 6 - 20 mg/dL 11 13 17   Creatinine 0.44 -  1.00 mg/dL 0.65 0.70 0.78  Sodium 135 - 145 mmol/L 142 137 129(L)  Potassium 3.5 -  5.1 mmol/L 3.7 2.4(LL) 4.8  Chloride 98 - 111 mmol/L 104 101 94(L)  CO2 22 - 32 mmol/L 27 28 19(L)  Calcium 8.9 - 10.3 mg/dL 7.2(L) 7.7(L) 8.6(L)  Total Protein 6.5 - 8.1 g/dL 4.1(L) - -  Total Bilirubin 0.3 - 1.2 mg/dL 1.9(H) - -  Alkaline Phos 38 - 126 U/L 489(H) - -  AST 15 - 41 U/L 52(H) - -  ALT 0 - 44 U/L 60(H) - -    Lab Results  Component Value Date   WBC 7.7 03/05/2021   HGB 9.5 (L) 03/05/2021   HCT 29.0 (L) 03/05/2021   MCV 86.8 03/05/2021   PLT 8 (LL) 03/05/2021   NEUTROABS 5.8 03/04/2021    CT Angio Chest PE W and/or Wo Contrast  Result Date: 02/28/2021 CLINICAL DATA:  Shortness of breath. EXAM: CT ANGIOGRAPHY CHEST WITH CONTRAST TECHNIQUE: Multidetector CT imaging of the chest was performed using the standard protocol during bolus administration of intravenous contrast. Multiplanar CT image reconstructions and MIPs were obtained to evaluate the vascular anatomy. CONTRAST:  27mL OMNIPAQUE IOHEXOL 350 MG/ML SOLN COMPARISON:  CT scan 09/21/2020 FINDINGS: Cardiovascular: The heart is mildly enlarged but stable. No pericardial effusion. Stable tortuosity and mild ectasia of the thoracic aorta but no aneurysm or dissection. Minimal atherosclerotic calcification at the aortic arch. The branch vessels are patent. No definite coronary artery calcifications. The pulmonary arterial tree is well opacified. Small bandlike filling defect noted in the proximal right upper lobe pulmonary artery in an area of a prior pulmonary embolus consistent with a fibrotic band. No findings suspicious for acute PE. Mediastinum/Nodes: There is a stable fluid collection noted near the left atrium and inferior pulmonary vein on the right side. This may be some type of epicardial cyst. No mediastinal or hilar adenopathy. The esophagus is grossly. Lungs/Pleura: Chronic appearing patchy mosaic pattern of ground-glass attenuation could be asymmetric pulmonary edema, small airways disease such as asthma or  respiratory bronchiolitis. Other possibilities would include cryptogenic organizing pneumonia or hypersensitivity pneumonitis. There are also small bilateral pleural effusions and overlying atelectasis. No worrisome pulmonary lesions. Upper Abdomen: No significant upper abdominal findings. Musculoskeletal: No breast masses are identified. There is a complex appearing fluid collection containing gas involving the right chest wall anteriorly. The upper part of this abscess is anterior to the thyroid gland and it appears it may communicate with a second lower abscess near the right sternoclavicular joint and anterior to the right clavicular head. Do not see any definite CT findings for septic arthritis at the right [place] joint that would be a possibility. This measures a maximum of 7.5 cm. Aspiration may be helpful for diagnostic and therapeutic purposes. MRI examination of the chest wall may be helpful for further evaluation. Review of the MIP images confirms the above findings. IMPRESSION: 1. Small bandlike filling defect in the proximal right upper lobe pulmonary artery in an area of a prior pulmonary embolus consistent with a fibrotic band. No findings suspicious for acute PE. 2. Stable tortuosity and mild ectasia of the thoracic aorta but no aneurysm or dissection. 3. Chronic appearing patchy mosaic pattern of ground-glass attenuation could be asymmetric pulmonary edema, small airways disease such as asthma or respiratory bronchiolitis. Other possibilities would include cryptogenic organizing pneumonia or hypersensitivity pneumonitis. 4. Small bilateral pleural effusions and overlying atelectasis. 5. 7.5 cm anterior chest wall abscess of uncertain origin. Right  sternoclavicular joint septic arthritis would be a possibility although I do not see any definite CT evidence of this. MRI may be helpful for further evaluation. Electronically Signed   By: Marijo Sanes M.D.   On: 02/28/2021 05:47   CT PELVIS W  CONTRAST  Result Date: 02/17/2021 CLINICAL DATA:  Sacral wound with increased bleeding. Evaluate wound depth and for osteomyelitis EXAM: CT PELVIS WITH CONTRAST TECHNIQUE: Multidetector CT imaging of the pelvis was performed using the standard protocol following the bolus administration of intravenous contrast. CONTRAST:  168mL OMNIPAQUE IOHEXOL 350 MG/ML SOLN COMPARISON:  11/22/2020 FINDINGS: Urinary Tract:  Unremarkable Bowel: Descending colostomy. No bowel obstruction or visible inflammation where covered Vascular/Lymphatic: Mild atheromatous calcification. No acute vascular finding or adenopathy. Reproductive:  Hysterectomy.  Negative adnexa Other:  No pelvic ascites. Musculoskeletal: Right para median to midline sacral decubitus ulcer deepest at the inferior extent where there is either internal debris or packing extending towards the anorectal junction with regional inflammation. From this point there is superiorly migrating soft tissue emphysema tracking upward along the posterior coccyx. No visible osteomyelitis or fracture. No rim enhancing fluid collection. A hematoma posterior to the left hip has organized and become low-density, 9 x 3.4 cm on axial slices. Generalized muscular atrophy. Severe lower lumbar spine degeneration where covered. IMPRESSION: 1. Sacral decubitus ulcer with regional cellulitis and soft tissue emphysema, as above. No osteomyelitis or drainable fluid collection. 2. Expected evolution of hematoma posterior to the left hip. Electronically Signed   By: Jorje Guild M.D.   On: 02/17/2021 09:32   MR CHEST W WO CONTRAST  Result Date: 03/03/2021 CLINICAL DATA:  Evaluate abscess EXAM: MRI CHEST WITHOUT AND WITH CONTRAST TECHNIQUE: Multiplanar, multisequence MR examination the upper right chest centered about the right sternoclavicular joint was performed both prior to and following the uncomplicated IV administration gadolinium contrast. CONTRAST:  7.49mL GADAVIST GADOBUTROL 1  MMOL/ML IV SOLN COMPARISON:  CT chest angiogram, 02/28/2021 FINDINGS: Limited mediastinum: Unremarkable. Limited lungs and pleura: Unremarkable. Musculoskeletal: Large, rim enhancing fluid collection centered about the right sternoclavicular joint, measuring 5.8 x 4.2 cm. There is no overt bony destruction or bone marrow edema (series 16, image 15). Additional, likely contiguous air and fluid collection with similar rim enhancement just superior to the sternal notch, incompletely imaged on this examination measuring approximately 6.3 x 4.0 cm (series 16, image 9). IMPRESSION: 1. Large, rim enhancing fluid collection centered about the right sternoclavicular joint, measuring 5.8 x 4.2 cm. 2. Additional, likely contiguous air and fluid collection with similar rim enhancement just superior to the sternal notch, incompletely imaged on this examination measuring approximately 6.3 x 4.0 cm. 3. No overt bony destruction or associated bone marrow edema. 4. Findings are most consistent with septic arthritis. Consider percutaneous fluid sampling. Electronically Signed   By: Delanna Ahmadi M.D.   On: 03/03/2021 15:07   IR US Guide Bx Asp/Drain  Result Date: 03/04/2021 INDICATION: 59 year old female referred for aspiration right sternoclavicular abscess/septic joint EXAM: ULTRASOUND-GUIDED ASPIRATION MEDICATIONS: None ANESTHESIA/SEDATION: None . COMPLICATIONS: None PROCEDURE: Informed written consent was obtained from the patient after a thorough discussion of the procedural risks, benefits and alternatives. All questions were addressed. Maximal Sterile Barrier Technique was utilized including caps, mask, sterile gowns, sterile gloves, sterile drape, hand hygiene and skin antiseptic. A timeout was performed prior to the initiation of the procedure. Ultrasound images of the right sternoclavicular joint were performed with images stored and sent to PACs. The patient is prepped and draped in the usual  sterile fashion. 1%  lidocaine was used for local anesthesia. Using ultrasound guidance, a Yueh needle was advanced into the fluid collection associated with the right sternoclavicular joint. Approximately 70 cc of frankly purulent material aspirated. Culture was sent. Final image was stored. Sterile bandage was placed. Patient tolerated the procedure well and remained hemodynamically stable throughout. No complications were encountered and no significant blood loss. IMPRESSION: Status post ultrasound-guided aspiration of right sternoclavicular septic joint. Signed, Dulcy Fanny. Dellia Nims, RPVI [facility] Electronically Signed   By: Corrie Mckusick D.O.   On: 03/04/2021 17:38   DG Chest Port 1 View  Result Date: 02/28/2021 CLINICAL DATA:  Shortness of breath for 1 week.  Chest pain. EXAM: PORTABLE CHEST 1 VIEW COMPARISON:  CT 09/21/2020 FINDINGS: Shallow inspiration. Mild cardiac enlargement. No vascular congestion, edema, or consolidation. Prominence of the mediastinum is unchanged since prior study and correlates to a combination of prominent mediastinal fat and lymphadenopathy. No airspace disease or consolidation in the lungs. IMPRESSION: Shallow inspiration. Cardiac enlargement. Prominent mediastinal structures correspond to mediastinal fat and lymphadenopathy on previous CT. No active pulmonary disease. Electronically Signed   By: Lucienne Capers M.D.   On: 02/28/2021 00:38   ECHOCARDIOGRAM COMPLETE  Result Date: 03/03/2021    ECHOCARDIOGRAM REPORT   Patient Name:   Rhonda [name] [facility] Date of Exam: 03/03/2021 Medical Rec #:  [id number]      Height:       63.0 in Accession #:    [id number]     Weight:       178.0 lb Date of Birth:  1960/07/13      BSA:          1.840 m Patient Age:    89 years       BP:           132/70 mmHg Patient Gender: F              HR:           92 bpm. Exam Location:  Inpatient Procedure: 2D Echo, Cardiac Doppler and Color Doppler Indications:     Bacteremia  History:        Patient has prior history of Echocardiogram examinations, most                 recent 01/15/2020. TIA, Signs/Symptoms:Shortness of Breath; Risk                 Factors:Hypertension and Diabetes.  Sonographer:    Wenda Low Referring Phys: [id number] [place]  1. Left ventricular ejection fraction, by estimation, is 50 to 55% with beat to beat variability. The left ventricle has low normal function. Left ventricular endocardial border not optimally defined to evaluate regional wall motion. There is mild left ventricular hypertrophy. Indeterminate diastolic filling due to E-A fusion.  2. Right ventricular systolic function is normal. The right ventricular size is normal. There is normal pulmonary artery systolic pressure. The estimated right ventricular systolic pressure is 67.6 mmHg.  3. A small pericardial effusion is present. The pericardial effusion is posterior to the left ventricle.  4. The mitral valve is normal in structure. Trivial mitral valve regurgitation. No evidence of mitral stenosis.  5. The aortic valve is tricuspid. There is mild calcification of the aortic valve. Aortic valve regurgitation is trivial. No aortic stenosis is present.  6. The inferior vena cava is normal in size with greater than 50% respiratory variability, suggesting right atrial pressure  of 3 mmHg. Conclusion(s)/Recommendation(s): No evidence of valvular vegetations on this transthoracic echocardiogram. Consider a transesophageal echocardiogram to exclude infective endocarditis if clinically indicated. FINDINGS  Left Ventricle: Left ventricular ejection fraction, by estimation, is 50 to 55%. The left ventricle has low normal function. Left ventricular endocardial border not optimally defined to evaluate regional wall motion. The left ventricular internal cavity  size was normal in size. There is mild left ventricular hypertrophy. Indeterminate diastolic filling due to E-A fusion.  Right Ventricle: The right ventricular size is normal. No increase in right ventricular wall thickness. Right ventricular systolic function is normal. There is normal pulmonary artery systolic pressure. The tricuspid regurgitant velocity is 2.78 m/s, and  with an assumed right atrial pressure of 3 mmHg, the estimated right ventricular systolic pressure is 93.7 mmHg. Left Atrium: Left atrial size was normal in size. Right Atrium: Right atrial size was normal in size. Pericardium: A small pericardial effusion is present. The pericardial effusion is posterior to the left ventricle. Presence of pericardial fat pad. Mitral Valve: The mitral valve is normal in structure. Trivial mitral valve regurgitation. No evidence of mitral valve stenosis. MV peak gradient, 4.9 mmHg. The mean mitral valve gradient is 1.0 mmHg. Tricuspid Valve: The tricuspid valve is normal in structure. Tricuspid valve regurgitation is mild . No evidence of tricuspid stenosis. Aortic Valve: The aortic valve is tricuspid. There is mild calcification of the aortic valve. Aortic valve regurgitation is trivial. No aortic stenosis is present. Aortic valve mean gradient measures 5.7 mmHg. Aortic valve peak gradient measures 12.0 mmHg. Aortic valve area, by VTI measures 1.80 cm. Pulmonic Valve: The pulmonic valve was normal in structure. Pulmonic valve regurgitation is trivial. No evidence of pulmonic stenosis. Aorta: The aortic root is normal in size and structure. Venous: The inferior vena cava is normal in size with greater than 50% respiratory variability, suggesting right atrial pressure of 3 mmHg. IAS/Shunts: The interatrial septum was not well visualized.  LEFT VENTRICLE PLAX 2D LVIDd:         3.90 cm   Diastology LVIDs:         2.50 cm   LV e' medial:    5.22 cm/s LV PW:         1.20 cm   LV E/e' medial:  10.9 LV IVS:        1.10 cm   LV e' lateral:   3.81 cm/s LVOT diam:     2.00 cm   LV E/e' lateral: 15.0 LV SV:         49 LV SV Index:   26 LVOT  Area:     3.14 cm  RIGHT VENTRICLE RV Basal diam:  2.75 cm RV Mid diam:    2.20 cm LEFT ATRIUM             Index        RIGHT ATRIUM           Index LA diam:        2.60 cm 1.41 cm/m   RA Area:     11.10 cm LA Vol (A2C):   35.9 ml 19.51 ml/m  RA Volume:   22.50 ml  12.23 ml/m LA Vol (A4C):   47.9 ml 26.03 ml/m LA Biplane Vol: 43.8 ml 23.80 ml/m  AORTIC VALVE                     PULMONIC VALVE AV Area (Vmax):    1.92 cm  PV Vmax:       0.76 m/s AV Area (Vmean):   1.81 cm      PV Peak grad:  2.3 mmHg AV Area (VTI):     1.80 cm AV Vmax:           173.55 cm/s AV Vmean:          109.859 cm/s AV VTI:            0.271 m AV Peak Grad:      12.0 mmHg AV Mean Grad:      5.7 mmHg LVOT Vmax:         106.00 cm/s LVOT Vmean:        63.300 cm/s LVOT VTI:          0.155 m LVOT/AV VTI ratio: 0.57  AORTA Ao Root diam: 3.00 cm MITRAL VALVE                TRICUSPID VALVE MV Area (PHT): 4.44 cm     TR Peak grad:   30.9 mmHg MV Area VTI:   1.80 cm     TR Vmax:        278.00 cm/s MV Peak grad:  4.9 mmHg MV Mean grad:  1.0 mmHg     SHUNTS MV Vmax:       1.11 m/s     Systemic VTI:  0.16 m MV Vmean:      53.6 cm/s    Systemic Diam: 2.00 cm MV Decel Time: 171 msec MV E velocity: 57.00 cm/s MV A velocity: 111.00 cm/s MV E/A ratio:  0.51 Cherlynn Kaiser MD Electronically signed by Cherlynn Kaiser MD Signature Date/Time: 03/03/2021/1:25:09 PM    Final

## 2021-03-05 NOTE — Consult Note (Signed)
[place] Nurse wound follow up PT changing Hydrotherapy to 3 x week.  Bedside RN to perform dressing changes on Sunday, Tues, [place] and Sat. WOC will coordinate with PT on Friday and re-assess the wound.  [name], MSN, RN, [place], [name], [facility] Wound Treatment Associate Pager [phone]

## 2021-03-05 NOTE — Progress Notes (Signed)
Physical Therapy Wound Treatment Patient Details  Name: Rhonda [name] MRN: [id number] Date of Birth: Nov 11, 1960  Today's Date: 03/05/2021 Time: 1224-8250 Time Calculation (min): 36 min  Subjective  Subjective Assessment Subjective: Asking for her hat - family to bring tomorrow. Patient and Family Stated Goals: Not stated Date of Onset:  (prior to hospitalization) Prior Treatments: Unknown  Pain Score:  Premedicated however appears painful with positioning. Tolerated hydrotherapy treatment well.   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 pink wound, necrotic tissue present, drainage around sacral wound with moderate serosanguineous drainage. (Active)  Dressing Type Barrier Film (skin prep);Gauze (Comment);Foam - Lift dressing to assess site every shift;Moist to moist;Santyl 03/05/21 1531  Dressing Changed;Clean;Dry;Intact 03/05/21 1531  Dressing Change Frequency Monday, Wednesday, Friday 03/05/21 1531  State of Healing Early/partial granulation 03/05/21 1531  Site / Wound Assessment Red;Pink;Yellow;Brown;Black 03/05/21 1531  % Wound base Red or Granulating 55% 03/05/21 1531  % Wound base Yellow/Fibrinous Exudate 10% 03/05/21 1531  % Wound base Black/Eschar 35% 03/05/21 1531  % Wound base Other/Granulation Tissue (Comment) 0% 03/05/21 1531  Peri-wound Assessment Intact 03/05/21 1531  Wound Length (cm) 10 cm 03/01/21 0800  Wound Width (cm) 6 cm 03/01/21 0800  Wound Depth (cm) 4 cm 03/01/21 0800  Wound Surface Area (cm^2) 60 cm^2 03/01/21 0800  Wound Volume (cm^3) 240 cm^3 03/01/21 0800  Tunneling (cm) 7 02/28/21 1050  Undermining (cm) 2-5 cm undermining 8 o'clock to 3 o'clock 03/01/21 0800  Margins Unattached edges (unapproximated) 03/05/21 1531  Drainage Amount Minimal 03/05/21 1531  Drainage Description Serosanguineous 03/05/21 1531  Treatment Debridement (Selective);Hydrotherapy (Pulse  lavage);Packing (Saline gauze) 03/05/21 1531      Hydrotherapy Pulsed lavage therapy - wound location: sacrum Pulsed Lavage with Suction (psi): 8 psi Pulsed Lavage with Suction - Normal Saline Used: 1000 mL Pulsed Lavage Tip: Tip with splash shield Selective Debridement Selective Debridement - Location: sacrum Selective Debridement - Tools Used: Forceps, Scissors Selective Debridement - Tissue Removed: black necrotic tissue    Wound Assessment and Plan  Wound Therapy - Assess/Plan/Recommendations Wound Therapy - Clinical Statement: Progressing with debridement, however minimal amounts able to be removed at a time. Feel this wound is appropriate for 3x/week hydrotherapy intervention. Reached out to [facility] RN to update orders. This patient will benefit from continued hydrotherapy for selective removal of unviable tissue, to decrease bioburden, and promote wound bed healing. Wound Therapy - Functional Problem List: Decreased tolerance for sitting Factors Delaying/Impairing Wound Healing: Incontinence, Immobility, Multiple medical problems, Polypharmacy Hydrotherapy Plan: Debridement, Dressing change, Patient/family education, Pulsatile lavage with suction Wound Therapy - Frequency: 6X / week Wound Therapy - Follow Up Recommendations: dressing changes by RN, dressing changes by family/patient  Wound Therapy Goals- Improve the function of patient's integumentary system by progressing the wound(s) through the phases of wound healing (inflammation - proliferation - remodeling) by: Wound Therapy Goals - Improve the function of patient's integumentary system by progressing the wound(s) through the phases of wound healing by: Decrease Necrotic Tissue to: 20 Decrease Necrotic Tissue - Progress: Progressing toward goal Increase Granulation Tissue to: 80 Increase Granulation Tissue - Progress: Progressing toward goal Goals/treatment plan/discharge plan were made with and agreed upon by patient/family:  No, Patient unable to participate in goals/treatment/discharge plan and family unavailable Time For Goal Achievement: 7 days Wound Therapy - Potential for Goals: Good  Goals will be updated until maximal potential achieved or discharge criteria met.  Discharge criteria:  when goals achieved, discharge from hospital, MD decision/surgical intervention, no progress towards goals, refusal/missing three consecutive treatments without notification or medical reason.  GP     Charges PT Wound Care Charges $Wound Debridement up to 20 cm: < or equal to 20 cm $ Wound Debridement each add'l 20 sqcm: 1 $PT PLS Gun and Tip: 1 Supply $PT Hydrotherapy Visit: 1 Visit       Thelma Comp 03/05/2021, 3:44 PM  Rolinda Roan, PT, DPT Acute Rehabilitation Services Pager: [phone] Office: [phone]

## 2021-03-05 NOTE — Plan of Care (Signed)

## 2021-03-05 NOTE — Progress Notes (Signed)
Subjective: No issues overnight. Underwent IR drainage of chest/lower neck abscess.  Objective: Vital signs in last 24 hours: Temp:  [98.2 F (36.8 C)-99.3 F (37.4 C)] 98.4 F (36.9 C) (10/18 0428) Pulse Rate:  [95-112] 95 (10/17 1700) Resp:  [16] 16 (10/18 0428) BP: (121-136)/(80-91) 126/80 (10/18 0428) SpO2:  [99 %-100 %] 99 % (10/18 0428)  Physical Exam: General appearance: Resting comfortably. NAD. Head: Normocephalic, without obvious abnormality, atraumatic Eyes: Pupils are equal, round, reactive to light.  Ears: Examination of the ears shows normal auricles and external auditory canals bilaterally.  Nose: Nasal examination shows normal mucosa, septum, turbinates.  Face: Facial examination shows no asymmetry. Palpation of the face elicit no significant tenderness.  Mouth: Oral cavity examination shows no mucosal lacerations or abnormality. Neck: Palpation of the neck reveals decreased lower neck swelling. The trachea is midline.  Chest: No stridor or respiratory distress.  Recent Labs    03/04/21 0415 03/05/21 0306  WBC 6.7 7.7  HGB 9.3* 9.5*  HCT 29.4* 29.0*  PLT 6* 8*   Recent Labs    03/04/21 0415 03/05/21 0306  NA 137 142  K 2.4* 3.7  CL 101 104  CO2 28 27  GLUCOSE 113* 186*  BUN 13 11  CREATININE 0.70 0.65  CALCIUM 7.7* 7.2*    Medications: I have reviewed the patient's current medications. Scheduled:  atorvastatin  80 mg Oral q1800   chlorhexidine  15 mL Mouth Rinse BID   collagenase   Topical BID   feeding supplement (GLUCERNA SHAKE)  237 mL Oral BID BM   ferrous sulfate  325 mg Oral Q breakfast   gabapentin  100 mg Oral TID   hydrocortisone sod succinate (SOLU-CORTEF) inj  100 mg Intravenous Q8H   insulin aspart  0-15 Units Subcutaneous TID WC   insulin aspart  0-5 Units Subcutaneous QHS   insulin glargine-yfgn  10 Units Subcutaneous QHS   mouth rinse  15 mL Mouth Rinse q12n4p   melatonin  10 mg Oral QHS   multivitamin with minerals  1  tablet Oral Daily   nystatin  5 mL Oral QID   pantoprazole  40 mg Oral Daily   silver nitrate applicators  1 application Topical Once   sodium chloride flush  3 mL Intravenous Q12H   Continuous:  ampicillin-sulbactam (UNASYN) IV 3 g (03/05/21 0607)   dextrose 5 % and 0.45% NaCl 75 mL/hr at 03/04/21 1753   vancomycin Stopped (03/04/21 1418)   PJK:[id number] **OR** acetaminophen, albuterol, hydrALAZINE, HYDROcodone-acetaminophen, morphine injection, ondansetron **OR** ondansetron (ZOFRAN) IV, polyvinyl alcohol  Assessment/Plan: Anterior chest wall and lower neck abscess, 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 - s/p IR drainage of abscess. - Continue with IV antibiotics as per ID. - No other ENT/surgical intervention recommended at this time.   LOS: 5 days   Ranald Alessio W Menna Abeln 03/05/2021, 7:24 AM

## 2021-03-05 NOTE — TOC Initial Note (Signed)
Transition of Care [facility]) - Initial/Assessment Note    Patient Details  Name: Rhonda [name] MRN: [id number] Date of Birth: 1960-11-05  [facility]) CM/SW Contact:    Joanne Chars, LCSW Phone Number: 03/05/2021, 2:51 PM  Clinical Narrative:   CSW spoke with pt husband [name] by phone, confirmed that they have decided to involve hospice.  CSW discussed choice, husband would like to go with [facility] for home hospice.    Bevely Palmer at [facility] aware of this referral and will reach out to family.                  Expected Discharge Plan: Home w Hospice Care Barriers to Discharge: Continued Medical Work up   Patient Goals and CMS Choice Patient states their goals for this hospitalization and ongoing recovery are:: comfort CMS Medicare.gov Compare Post Acute Care list provided to:: Patient Represenative (must comment) Choice offered to / list presented to : Spouse  Expected Discharge Plan and Services Expected Discharge Plan: [place] Acute Care Choice: Hospice Living arrangements for the past 2 months: Single Family Home                                      Prior Living Arrangements/Services Living arrangements for the past 2 months: Single Family Home Lives with:: Spouse          Need for Family Participation in Patient Care: Yes (Comment) Care giver support system in place?: Yes (comment) Current home services:  (none) Criminal Activity/Legal Involvement Pertinent to Current Situation/Hospitalization: No - Comment as needed  Activities of Daily Living Home Assistive Devices/Equipment: Wheelchair, Environmental consultant (specify type), Bedside commode/3-in-1 ADL Screening (condition at time of admission) Patient's cognitive ability adequate to safely complete daily activities?: No Is the patient deaf or have difficulty hearing?: No Does the patient have difficulty seeing, even when wearing glasses/contacts?: No Does the patient have  difficulty concentrating, remembering, or making decisions?: Yes Patient able to express need for assistance with ADLs?: No Does the patient have difficulty dressing or bathing?: Yes Independently performs ADLs?: No Communication: Independent Dressing (OT): Dependent Is this a change from baseline?: Pre-admission baseline Grooming: Dependent Is this a change from baseline?: Pre-admission baseline Feeding: Needs assistance Is this a change from baseline?: Pre-admission baseline Bathing: Dependent Is this a change from baseline?: Pre-admission baseline Toileting: Dependent Is this a change from baseline?: Pre-admission baseline In/Out Bed: Needs assistance Is this a change from baseline?: Pre-admission baseline Walks in Home: Dependent Is this a change from baseline?: Change from baseline, expected to last >3 days Does the patient have difficulty walking or climbing stairs?: Yes Weakness of Legs: Both Weakness of Arms/Hands: Both  Permission Sought/Granted                  Emotional Assessment       Orientation: : Oriented to Self, Oriented to Place Alcohol / Substance Use: Not Applicable Psych Involvement: No (comment)  Admission diagnosis:  Shortness of breath [R06.02] Hypokalemia [E87.6] Acute respiratory failure with hypoxia (HCC) [J96.01] Patient Active Problem List   Diagnosis Date Noted   Acute respiratory failure with hypoxia ([place])    Goals of care, counseling/discussion    Chest wall abscess    MRSA bacteremia    Malnutrition of moderate degree 03/01/2021   Shortness of breath 02/28/2021   Unstageable pressure ulcer of  sacral region [facility]) 02/28/2021   Hypokalemia 02/28/2021   DNR (do not resuscitate) 02/28/2021   Hematochezia    Diverticulosis of colon with hemorrhage    Acute blood loss anemia    Elevated liver function tests    Bowel perforation ([place]) 09/26/2020   Hypotension    Rectal bleeding    AKI (acute kidney injury) ([place])    GI bleed  08/29/2020   Neurosarcoidosis 08/06/2020   High risk medication use 08/06/2020   Weakness 08/06/2020   Type 2 diabetes mellitus without complication, with long-term current use of insulin ([place]) 08/06/2020   Acute lower GI bleeding 07/23/2020   Hyperosmolar hyperglycemic state (HHS) ([place]) 07/23/2020   Diverticulitis of large intestine with bleeding 07/23/2020   Liver lesion 07/23/2020   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   HLD (hyperlipidemia) 01/12/2020   Acute lower UTI 01/12/2020   AMS (altered mental status) 01/11/2020   Memory changes 01/08/2020   Altered mental status    Hyponatremia    Speech disturbance    Demyelinating changes in brain ([place]) 11/13/2019   Left pontine stroke ([place]) 05/23/2019   Elevated transaminase level 05/23/2019   Nausea and vomiting 05/23/2019   Leukocytosis 05/23/2019   Thoracic lymphadenopathy 08/26/2018   Axillary lymphadenopathy 08/26/2018   Piriformis syndrome of right side 09/29/2016   OSA (obstructive sleep apnea) 09/27/2015   Right hip pain 04/11/2015   Essential hypertension 11/07/2014   Chronic low back pain 11/07/2014   Vitamin D deficiency 11/07/2014   PCP:  [place]:   [place], [place] [address] [place] [address] Phone: [phone] Fax: [phone]  [facility] [address] [address] Phone: [phone] Fax: [phone]     Social Determinants of Health (SDOH) Interventions    Readmission Risk Interventions Readmission Risk Prevention Plan 11/26/2020 09/12/2020 09/04/2020  Transportation Screening Complete Complete Complete  Medication Review (RN Care Manager) Complete - Referral to Pharmacy  PCP or Specialist appointment within 3-5 days of discharge Complete Complete Complete  HRI or Home Care Consult Complete Complete Complete  SW Recovery Care/Counseling Consult Complete Complete  Complete  Palliative Care Screening Not Applicable Not Applicable Not Applicable  Skilled Nursing Facility Not Applicable Complete Complete  Some recent data might be hidden

## 2021-03-05 NOTE — Consult Note (Signed)
[address]       [place] [address]             [phone]                    Rhonda [name] [place] Medical Record #[id number] Date of Birth: 1960/05/30  Referring: No ref. provider found Primary Care: [place] Primary Cardiologist: None  Chief Complaint:   No chief complaint on file.   History of Present Illness:    Rhonda [name] 60 y.o. female admitted with MRSA bacteremia.  Cross-sectional imaging was performed last week demonstrated a chest wall abscess along with cervical abscess.  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  CTS was consulted to assist with management.  This patient is quite debilitated and that she has had an exploratory laparotomy along with placement of a colostomy.  She also has a stage IV decubitus ulcer, and is deconditioned and malnourished.     Past Medical History:  Diagnosis Date   Arthritis    Colostomy in place [facility])    ?ischemic bowel   Degenerative disc disease, lumbar    DNR (do not resuscitate) 02/28/2021   GERD (gastroesophageal reflux disease)    Hypertension    OSA (obstructive sleep apnea)    not currently on CPAP   Prediabetes    Sarcoidosis of central nervous system    Seizures (HCC)    Type 2    Urinary incontinence     Past Surgical History:  Procedure Laterality Date   ABDOMINAL HYSTERECTOMY     BOWEL RESECTION N/A 09/01/2020   Procedure: SMALL BOWEL RESECTION;  Surgeon: Georganna Skeans, MD;  Location: [place];  Service: General;  Laterality: N/A;   BREAST EXCISIONAL BIOPSY Right    BREAST SURGERY     right breast   CESAREAN SECTION     COLONOSCOPY WITH PROPOFOL N/A 07/26/2020   Procedure: COLONOSCOPY WITH PROPOFOL;  Surgeon: Carol Ada, MD;  Location: [place];  Service: Endoscopy;  Laterality: N/A;   ESOPHAGOGASTRODUODENOSCOPY (EGD) WITH PROPOFOL N/A 07/24/2020   Procedure: ESOPHAGOGASTRODUODENOSCOPY (EGD) WITH PROPOFOL;  Surgeon: Carol Ada,  MD;  Location: [place];  Service: Endoscopy;  Laterality: N/A;  Bleeding with anemia-5 gms drop in hemoglobin   GIVENS CAPSULE STUDY N/A 08/21/2020   Procedure: GIVENS CAPSULE STUDY;  Surgeon: Juanita Craver, MD;  Location[facility] ENDOSCOPY;  Service: Endoscopy;  Laterality: N/A;   IR ANGIOGRAM FOLLOW UP STUDY  08/29/2020   IR ANGIOGRAM SELECTIVE EACH ADDITIONAL VESSEL  08/29/2020   IR ANGIOGRAM VISCERAL SELECTIVE  08/29/2020   IR ANGIOGRAM VISCERAL SELECTIVE  08/29/2020   IR EMBO ART  VEN HEMORR LYMPH EXTRAV  INC GUIDE ROADMAPPING  08/29/2020   IR US GUIDE BX ASP/DRAIN  03/04/2021   IR US GUIDE VASC ACCESS RIGHT  08/29/2020   LAPAROTOMY N/A 09/01/2020   Procedure: EXPLORATORY LAPAROTOMY;  Surgeon: Georganna Skeans, MD;  Location: [place];  Service: General;  Laterality: N/A;    Family History  Problem Relation Age of Onset   Cancer Mother    Hypertension Sister    Diabetes Sister    Diabetes Maternal Grandmother      Social History   Tobacco Use  Smoking Status Never  Smokeless Tobacco Never    Social History   Substance and Sexual Activity  Alcohol Use No     Allergies  Allergen Reactions   Aspirin Swelling   Augmentin [Amoxicillin-Pot Clavulanate]  Swelling and Other (See Comments)    **patient has tolerated piperacillin/tazobactam, cephalexin, and ceftriaxone Hand swelled at IV site - likely extravasation    Current Facility-Administered Medications  Medication Dose Route Frequency Provider Last Rate Last Admin   acetaminophen (TYLENOL) tablet 650 mg  650 mg Oral Q6H PRN Karmen Bongo, MD       Or   acetaminophen (TYLENOL) suppository 650 mg  650 mg Rectal Q6H PRN Karmen Bongo, MD       albuterol (PROVENTIL) (2.5 MG/3ML) 0.083% nebulizer solution 2.5 mg  2.5 mg Nebulization Q2H PRN Karmen Bongo, MD       Ampicillin-Sulbactam (UNASYN) 3 g in sodium chloride 0.9 % 100 mL IVPB  3 g Intravenous Q6H Lyndee Leo, RPH 200 mL/hr at 03/05/21 0607 3 g at 03/05/21 0607    atorvastatin (LIPITOR) tablet 80 mg  80 mg Oral q1800 Karmen Bongo, MD   80 mg at 03/03/21 1618   chlorhexidine (PERIDEX) 0.12 % solution 15 mL  15 mL Mouth Rinse BID Karmen Bongo, MD   15 mL at 03/04/21 2229   collagenase (SANTYL) ointment   Topical BID Norm Parcel, PA-C   Given at 03/05/21 0000   dextrose 5 %-0.45 % sodium chloride infusion   Intravenous Continuous Oswald Hillock, MD 75 mL/hr at 03/04/21 1753 New Bag at 03/04/21 1753   feeding supplement (GLUCERNA SHAKE) (GLUCERNA SHAKE) liquid 237 mL  237 mL Oral BID BM Karmen Bongo, MD   237 mL at 03/01/21 1301   ferrous sulfate tablet 325 mg  325 mg Oral Q breakfast Karmen Bongo, MD   325 mg at 02/28/21 1107   gabapentin (NEURONTIN) capsule 100 mg  100 mg Oral TID Karmen Bongo, MD   100 mg at 03/04/21 1445   hydrALAZINE (APRESOLINE) injection 5 mg  5 mg Intravenous Q4H PRN Karmen Bongo, MD       HYDROcodone-acetaminophen (NORCO/VICODIN) 5-325 MG per tablet 1-2 tablet  1-2 tablet Oral Q4H PRN Karmen Bongo, MD   2 tablet at 03/03/21 1330   hydrocortisone sodium succinate (SOLU-CORTEF) 100 MG injection 100 mg  100 mg Intravenous Q8H Oswald Hillock, MD   100 mg at 03/05/21 5284   insulin aspart (novoLOG) injection 0-15 Units  0-15 Units Subcutaneous TID WC Karmen Bongo, MD   2 Units at 03/04/21 1833   insulin aspart (novoLOG) injection 0-5 Units  0-5 Units Subcutaneous QHS Karmen Bongo, MD   2 Units at 02/28/21 2155   insulin glargine-yfgn (SEMGLEE) injection 10 Units  10 Units Subcutaneous QHS Oswald Hillock, MD   10 Units at 03/04/21 2227   MEDLINE mouth rinse  15 mL Mouth Rinse q12n4p Karmen Bongo, MD   15 mL at 03/04/21 1446   melatonin tablet 10 mg  10 mg Oral Ivery Quale, MD   10 mg at 02/28/21 2147   morphine 2 MG/ML injection 2 mg  2 mg Intravenous Q2H PRN Karmen Bongo, MD   2 mg at 03/05/21 0730   multivitamin with minerals tablet 1 tablet  1 tablet Oral Daily Karmen Bongo, MD   1 tablet at  02/28/21 1843   nystatin (MYCOSTATIN) 100000 UNIT/ML suspension 500,000 Units  5 mL Oral QID Karmen Bongo, MD   500,000 Units at 03/04/21 2229   ondansetron (ZOFRAN) tablet 4 mg  4 mg Oral Q6H PRN Karmen Bongo, MD       Or   ondansetron [facility]) injection 4 mg  4 mg Intravenous Q6H PRN Lorin Mercy,  Anderson Malta, MD   4 mg at 03/01/21 1241   pantoprazole (PROTONIX) EC tablet 40 mg  40 mg Oral Daily Karmen Bongo, MD   40 mg at 02/28/21 1107   polyvinyl alcohol (LIQUIFILM TEARS) 1.4 % ophthalmic solution 1 drop  1 drop Both Eyes Daily PRN Karmen Bongo, MD       silver nitrate applicators applicator 1 application  1 application Topical Once Norm Parcel, PA-C       sodium chloride flush (NS) 0.9 % injection 3 mL  3 mL Intravenous Q12H Karmen Bongo, MD   3 mL at 03/04/21 2230   vancomycin (VANCOCIN) IVPB 1000 mg/200 mL premix  1,000 mg Intravenous Q24H Hammons, Theone Murdoch, RPH   Stopped at 03/04/21 1418    Review of Systems  Unable to perform ROS: Critical illness   PHYSICAL EXAMINATION: BP 126/80   Pulse 95   Temp 98.4 F (36.9 C) (Oral)   Resp 16   Ht 5\' 3"  (1.6 m)   Wt 80.7 kg   SpO2 99%   BMI 31.53 kg/m   Physical Exam Constitutional:      General: She is not in acute distress.    Appearance: She is obese. She is ill-appearing. She is not toxic-appearing.  Neck:     Comments: No tenderness over the neck. Fullness over the right sternoclavicular joint.  No tenderness. Cardiovascular:     Rate and Rhythm: Normal rate.  Musculoskeletal:     Cervical back: No rigidity.  Skin:    General: Skin is warm.     Diagnostic Studies & Laboratory data:     Recent Radiology Findings:   CT Angio Chest PE W and/or Wo Contrast  Result Date: 02/28/2021 CLINICAL DATA:  Shortness of breath. EXAM: CT ANGIOGRAPHY CHEST WITH CONTRAST TECHNIQUE: Multidetector CT imaging of the chest was performed using the standard protocol during bolus administration of intravenous contrast.  Multiplanar CT image reconstructions and MIPs were obtained to evaluate the vascular anatomy. CONTRAST:  92mL OMNIPAQUE IOHEXOL 350 MG/ML SOLN COMPARISON:  CT scan 09/21/2020 FINDINGS: Cardiovascular: The heart is mildly enlarged but stable. No pericardial effusion. Stable tortuosity and mild ectasia of the thoracic aorta but no aneurysm or dissection. Minimal atherosclerotic calcification at the aortic arch. The branch vessels are patent. No definite coronary artery calcifications. The pulmonary arterial tree is well opacified. Small bandlike filling defect noted in the proximal right upper lobe pulmonary artery in an area of a prior pulmonary embolus consistent with a fibrotic band. No findings suspicious for acute PE. Mediastinum/Nodes: There is a stable fluid collection noted near the left atrium and inferior pulmonary vein on the right side. This may be some type of epicardial cyst. No mediastinal or hilar adenopathy. The esophagus is grossly. Lungs/Pleura: Chronic appearing patchy mosaic pattern of ground-glass attenuation could be asymmetric pulmonary edema, small airways disease such as asthma or respiratory bronchiolitis. Other possibilities would include cryptogenic organizing pneumonia or hypersensitivity pneumonitis. There are also small bilateral pleural effusions and overlying atelectasis. No worrisome pulmonary lesions. Upper Abdomen: No significant upper abdominal findings. Musculoskeletal: No breast masses are identified. There is a complex appearing fluid collection containing gas involving the right chest wall anteriorly. The upper part of this abscess is anterior to the thyroid gland and it appears it may communicate with a second lower abscess near the right sternoclavicular joint and anterior to the right clavicular head. Do not see any definite CT findings for septic arthritis at the right [place] joint that would  be a possibility. This measures a maximum of 7.5 cm. Aspiration may be helpful for  diagnostic and therapeutic purposes. MRI examination of the chest wall may be helpful for further evaluation. Review of the MIP images confirms the above findings. IMPRESSION: 1. Small bandlike filling defect in the proximal right upper lobe pulmonary artery in an area of a prior pulmonary embolus consistent with a fibrotic band. No findings suspicious for acute PE. 2. Stable tortuosity and mild ectasia of the thoracic aorta but no aneurysm or dissection. 3. Chronic appearing patchy mosaic pattern of ground-glass attenuation could be asymmetric pulmonary edema, small airways disease such as asthma or respiratory bronchiolitis. Other possibilities would include cryptogenic organizing pneumonia or hypersensitivity pneumonitis. 4. Small bilateral pleural effusions and overlying atelectasis. 5. 7.5 cm anterior chest wall abscess of uncertain origin. Right sternoclavicular joint septic arthritis would be a possibility although I do not see any definite CT evidence of this. MRI may be helpful for further evaluation. Electronically Signed   By: Marijo Sanes M.D.   On: 02/28/2021 05:47   CT PELVIS W CONTRAST  Result Date: 02/17/2021 CLINICAL DATA:  Sacral wound with increased bleeding. Evaluate wound depth and for osteomyelitis EXAM: CT PELVIS WITH CONTRAST TECHNIQUE: Multidetector CT imaging of the pelvis was performed using the standard protocol following the bolus administration of intravenous contrast. CONTRAST:  171mL OMNIPAQUE IOHEXOL 350 MG/ML SOLN COMPARISON:  11/22/2020 FINDINGS: Urinary Tract:  Unremarkable Bowel: Descending colostomy. No bowel obstruction or visible inflammation where covered Vascular/Lymphatic: Mild atheromatous calcification. No acute vascular finding or adenopathy. Reproductive:  Hysterectomy.  Negative adnexa Other:  No pelvic ascites. Musculoskeletal: Right para median to midline sacral decubitus ulcer deepest at the inferior extent where there is either internal debris or packing  extending towards the anorectal junction with regional inflammation. From this point there is superiorly migrating soft tissue emphysema tracking upward along the posterior coccyx. No visible osteomyelitis or fracture. No rim enhancing fluid collection. A hematoma posterior to the left hip has organized and become low-density, 9 x 3.4 cm on axial slices. Generalized muscular atrophy. Severe lower lumbar spine degeneration where covered. IMPRESSION: 1. Sacral decubitus ulcer with regional cellulitis and soft tissue emphysema, as above. No osteomyelitis or drainable fluid collection. 2. Expected evolution of hematoma posterior to the left hip. Electronically Signed   By: Jorje Guild M.D.   On: 02/17/2021 09:32   MR CHEST W WO CONTRAST  Result Date: 03/03/2021 CLINICAL DATA:  Evaluate abscess EXAM: MRI CHEST WITHOUT AND WITH CONTRAST TECHNIQUE: Multiplanar, multisequence MR examination the upper right chest centered about the right sternoclavicular joint was performed both prior to and following the uncomplicated IV administration gadolinium contrast. CONTRAST:  7.38mL GADAVIST GADOBUTROL 1 MMOL/ML IV SOLN COMPARISON:  CT chest angiogram, 02/28/2021 FINDINGS: Limited mediastinum: Unremarkable. Limited lungs and pleura: Unremarkable. Musculoskeletal: Large, rim enhancing fluid collection centered about the right sternoclavicular joint, measuring 5.8 x 4.2 cm. There is no overt bony destruction or bone marrow edema (series 16, image 15). Additional, likely contiguous air and fluid collection with similar rim enhancement just superior to the sternal notch, incompletely imaged on this examination measuring approximately 6.3 x 4.0 cm (series 16, image 9). IMPRESSION: 1. Large, rim enhancing fluid collection centered about the right sternoclavicular joint, measuring 5.8 x 4.2 cm. 2. Additional, likely contiguous air and fluid collection with similar rim enhancement just superior to the sternal notch, incompletely  imaged on this examination measuring approximately 6.3 x 4.0 cm. 3. No overt bony destruction or  associated bone marrow edema. 4. Findings are most consistent with septic arthritis. Consider percutaneous fluid sampling. Electronically Signed   By: Delanna Ahmadi M.D.   On: 03/03/2021 15:07   IR US Guide Bx Asp/Drain  Result Date: 03/04/2021 INDICATION: 60 year old female referred for aspiration right sternoclavicular abscess/septic joint EXAM: ULTRASOUND-GUIDED ASPIRATION MEDICATIONS: None ANESTHESIA/SEDATION: None . COMPLICATIONS: None PROCEDURE: Informed written consent was obtained from the patient after a thorough discussion of the procedural risks, benefits and alternatives. All questions were addressed. Maximal Sterile Barrier Technique was utilized including caps, mask, sterile gowns, sterile gloves, sterile drape, hand hygiene and skin antiseptic. A timeout was performed prior to the initiation of the procedure. Ultrasound images of the right sternoclavicular joint were performed with images stored and sent to PACs. The patient is prepped and draped in the usual sterile fashion. 1% lidocaine was used for local anesthesia. Using ultrasound guidance, a Yueh needle was advanced into the fluid collection associated with the right sternoclavicular joint. Approximately 70 cc of frankly purulent material aspirated. Culture was sent. Final image was stored. Sterile bandage was placed. Patient tolerated the procedure well and remained hemodynamically stable throughout. No complications were encountered and no significant blood loss. IMPRESSION: Status post ultrasound-guided aspiration of right sternoclavicular septic joint. Signed, Dulcy Fanny. Dellia Nims, RPVI [facility] Electronically Signed   By: Corrie Mckusick D.O.   On: 03/04/2021 17:38   DG Chest Port 1 View  Result Date: 02/28/2021 CLINICAL DATA:  Shortness of breath for 1 week.  Chest pain. EXAM:  PORTABLE CHEST 1 VIEW COMPARISON:  CT 09/21/2020 FINDINGS: Shallow inspiration. Mild cardiac enlargement. No vascular congestion, edema, or consolidation. Prominence of the mediastinum is unchanged since prior study and correlates to a combination of prominent mediastinal fat and lymphadenopathy. No airspace disease or consolidation in the lungs. IMPRESSION: Shallow inspiration. Cardiac enlargement. Prominent mediastinal structures correspond to mediastinal fat and lymphadenopathy on previous CT. No active pulmonary disease. Electronically Signed   By: Lucienne Capers M.D.   On: 02/28/2021 00:38   ECHOCARDIOGRAM COMPLETE  Result Date: 03/03/2021    ECHOCARDIOGRAM REPORT   Patient Name:   CARLEAN CROWL [facility] Date of Exam: 03/03/2021 Medical Rec #:  [id number]      Height:       63.0 in Accession #:    [id number]     Weight:       178.0 lb Date of Birth:  [birth year]      BSA:          1.840 m Patient Age:    32 years       BP:           132/70 mmHg Patient Gender: F              HR:           92 bpm. Exam Location:  Inpatient Procedure: 2D Echo, Cardiac Doppler and Color Doppler Indications:    Bacteremia  History:        Patient has prior history of Echocardiogram examinations, most                 recent 01/15/2020. TIA, Signs/Symptoms:Shortness of Breath; Risk                 Factors:Hypertension and Diabetes.  Sonographer:    Wenda Low Referring Phys: [id number] [place]  1. Left ventricular ejection fraction, by estimation, is 50 to 55% with beat to beat variability. The left  ventricle has low normal function. Left ventricular endocardial border not optimally defined to evaluate regional wall motion. There is mild left ventricular hypertrophy. Indeterminate diastolic filling due to E-A fusion.  2. Right ventricular systolic function is normal. The right ventricular size is normal. There is normal pulmonary artery systolic pressure. The estimated right ventricular systolic pressure is 38.7  mmHg.  3. A small pericardial effusion is present. The pericardial effusion is posterior to the left ventricle.  4. The mitral valve is normal in structure. Trivial mitral valve regurgitation. No evidence of mitral stenosis.  5. The aortic valve is tricuspid. There is mild calcification of the aortic valve. Aortic valve regurgitation is trivial. No aortic stenosis is present.  6. The inferior vena cava is normal in size with greater than 50% respiratory variability, suggesting right atrial pressure of 3 mmHg. Conclusion(s)/Recommendation(s): No evidence of valvular vegetations on this transthoracic echocardiogram. Consider a transesophageal echocardiogram to exclude infective endocarditis if clinically indicated. FINDINGS  Left Ventricle: Left ventricular ejection fraction, by estimation, is 50 to 55%. The left ventricle has low normal function. Left ventricular endocardial border not optimally defined to evaluate regional wall motion. The left ventricular internal cavity  size was normal in size. There is mild left ventricular hypertrophy. Indeterminate diastolic filling due to E-A fusion. Right Ventricle: The right ventricular size is normal. No increase in right ventricular wall thickness. Right ventricular systolic function is normal. There is normal pulmonary artery systolic pressure. The tricuspid regurgitant velocity is 2.78 m/s, and  with an assumed right atrial pressure of 3 mmHg, the estimated right ventricular systolic pressure is 56.4 mmHg. Left Atrium: Left atrial size was normal in size. Right Atrium: Right atrial size was normal in size. Pericardium: A small pericardial effusion is present. The pericardial effusion is posterior to the left ventricle. Presence of pericardial fat pad. Mitral Valve: The mitral valve is normal in structure. Trivial mitral valve regurgitation. No evidence of mitral valve stenosis. MV peak gradient, 4.9 mmHg. The mean mitral valve gradient is 1.0 mmHg. Tricuspid Valve: The  tricuspid valve is normal in structure. Tricuspid valve regurgitation is mild . No evidence of tricuspid stenosis. Aortic Valve: The aortic valve is tricuspid. There is mild calcification of the aortic valve. Aortic valve regurgitation is trivial. No aortic stenosis is present. Aortic valve mean gradient measures 5.7 mmHg. Aortic valve peak gradient measures 12.0 mmHg. Aortic valve area, by VTI measures 1.80 cm. Pulmonic Valve: The pulmonic valve was normal in structure. Pulmonic valve regurgitation is trivial. No evidence of pulmonic stenosis. Aorta: The aortic root is normal in size and structure. Venous: The inferior vena cava is normal in size with greater than 50% respiratory variability, suggesting right atrial pressure of 3 mmHg. IAS/Shunts: The interatrial septum was not well visualized.  LEFT VENTRICLE PLAX 2D LVIDd:         3.90 cm   Diastology LVIDs:         2.50 cm   LV e' medial:    5.22 cm/s LV PW:         1.20 cm   LV E/e' medial:  10.9 LV IVS:        1.10 cm   LV e' lateral:   3.81 cm/s LVOT diam:     2.00 cm   LV E/e' lateral: 15.0 LV SV:         49 LV SV Index:   26 LVOT Area:     3.14 cm  RIGHT VENTRICLE RV Basal diam:  2.75 cm RV Mid diam:    2.20 cm LEFT ATRIUM             Index        RIGHT ATRIUM           Index LA diam:        2.60 cm 1.41 cm/m   RA Area:     11.10 cm LA Vol (A2C):   35.9 ml 19.51 ml/m  RA Volume:   22.50 ml  12.23 ml/m LA Vol (A4C):   47.9 ml 26.03 ml/m LA Biplane Vol: 43.8 ml 23.80 ml/m  AORTIC VALVE                     PULMONIC VALVE AV Area (Vmax):    1.92 cm      PV Vmax:       0.76 m/s AV Area (Vmean):   1.81 cm      PV Peak grad:  2.3 mmHg AV Area (VTI):     1.80 cm AV Vmax:           173.55 cm/s AV Vmean:          109.859 cm/s AV VTI:            0.271 m AV Peak Grad:      12.0 mmHg AV Mean Grad:      5.7 mmHg LVOT Vmax:         106.00 cm/s LVOT Vmean:        63.300 cm/s LVOT VTI:          0.155 m LVOT/AV VTI ratio: 0.57  AORTA Ao Root diam: 3.00 cm MITRAL  VALVE                TRICUSPID VALVE MV Area (PHT): 4.44 cm     TR Peak grad:   30.9 mmHg MV Area VTI:   1.80 cm     TR Vmax:        278.00 cm/s MV Peak grad:  4.9 mmHg MV Mean grad:  1.0 mmHg     SHUNTS MV Vmax:       1.11 m/s     Systemic VTI:  0.16 m MV Vmean:      53.6 cm/s    Systemic Diam: 2.00 cm MV Decel Time: 171 msec MV E velocity: 57.00 cm/s MV A velocity: 111.00 cm/s MV E/A ratio:  0.51 Cherlynn Kaiser MD Electronically signed by Cherlynn Kaiser MD Signature Date/Time: 03/03/2021/1:25:09 PM    Final        I have independently reviewed the above radiology studies  and reviewed the findings with the patient.   Recent Lab Findings: Lab Results  Component Value Date   WBC 7.7 03/05/2021   HGB 9.5 (L) 03/05/2021   HCT 29.0 (L) 03/05/2021   PLT 8 (LL) 03/05/2021   GLUCOSE 186 (H) 03/05/2021   CHOL 248 (H) 05/24/2019   TRIG 72 09/24/2020   HDL 35 (L) 05/24/2019   LDLCALC 179 (H) 05/24/2019   ALT 60 (H) 03/05/2021   AST 52 (H) 03/05/2021   NA 142 03/05/2021   K 3.7 03/05/2021   CL 104 03/05/2021   CREATININE 0.65 03/05/2021   BUN 11 03/05/2021   CO2 27 03/05/2021   TSH 0.884 01/08/2020   INR 1.2 03/04/2021   HGBA1C 7.8 (H) 11/22/2020        Assessment / Plan:   60 year old female with a right sternoclavicular joint abscess with osteomyelitis.  There is also a connecting cervical abscess.  This patient is quite deconditioned and debilitated.  She is chronically malnourished as well.  Given her functional status I do not think that surgical debridement would be a good option given that this would be a very large incision and a very morbid area, that she likely would not heal given her nutritional status.  Would recommend proceeding with a CT-guided drain, and ongoing antibiotics.  We will follow peripherally.      Areya Lemmerman O Coletta Lockner 03/05/2021 8:00 AM

## 2021-03-05 NOTE — Progress Notes (Signed)
Speech Language Pathology Treatment: Dysphagia  Patient Details Name: Rhonda [name] MRN: [id number] DOB: 1961/02/24 Today's Date: 03/05/2021 Time: 1779-3903 SLP Time Calculation (min) (ACUTE ONLY): 15 min  Assessment / Plan / Recommendation Clinical Impression  Pt lethargic with reduced effort due to morphine (in significant pain and crying this am prior to dose).  Able to participate but required cues and encouragement.  Daughter at bedside and assisted with repositioning - pt would only allow slight HOB elevation.  She maintained head turned to the left with neck in flexion; kept eyes closed. Required cues to keep head in neutral.  Accepted sips of thin water and bites of pudding with the appearance of potential delays; no coughing; frequent belching.  Limited attentiveness overall.  Recommend resuming a PO diet, but start back with dysphagia 1/thin liquids. Encourage Ms. Rocha to feed herself.  SLP will follow while admitted. D/W dtr, husband, and Therapist, sports.   HPI HPI: Rhonda [name] is a 60 y.o. female with medical history significant of HTN; OSA not on CPAP; prediabetes; neurosarcoidosis; colostomy; and seizures presenting with SOB.  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  - s/p IR drainage of abscess 10/17.      SLP Plan  Continue with current plan of care      Recommendations for follow up therapy are one component of a multi-disciplinary discharge planning process, led by the attending physician.  Recommendations may be updated based on patient status, additional functional criteria and insurance authorization.    Recommendations  Diet recommendations: Dysphagia 1 (puree);Thin liquid Liquids provided via: Cup;Straw Medication Administration: Whole meds with puree Supervision: Staff to assist with self feeding Compensations: Minimize environmental distractions                Oral Care  Recommendations: Oral care BID Follow up Recommendations: 24 hour supervision/assistance SLP Visit Diagnosis: Dysphagia, unspecified (R13.10) Plan: Continue with current plan of care       GO              Claudean Leavelle L. Tivis Ringer, [place] CCC/SLP Acute Rehabilitation Services Office number [phone] Pager [phone]   Juan Quam Laurice  03/05/2021, 12:06 PM

## 2021-03-05 NOTE — Progress Notes (Signed)
Palliative Medicine Inpatient Follow Up Note  Consulting Provider: Karmen Bongo, MD   Reason for consult:   [place] Palliative Medicine Consult  Reason for Consult? goals of care    HPI:  Per intake H&P --> Rhonda [name] is a 60 y.o. female with medical history significant of HTN; OSA not on CPAP; prediabetes; neurosarcoidosis; colostomy; and seizures presenting with SOB.  She has been having trouble breathing since last night and complaining of chest pain.  Symptoms started yesterday AM and progressed all day and night.  Appears to be epigastric when she points to it.  +cough, nonproductive.  She does throat clearing after drinking water.  No change in LE edema. No fever.  No sick contacts.  The pain is currently resolved.  She has a sacral pressure ulcer that her husband treats.   Palliative care has been asked to get involved to further address goals of care.  Today's Discussion (03/05/2021):  Chart reviewed. Patient denies pain or distress.  I met at bedside with Rhonda [name], her husband Rhonda [name], and her daughter Rhonda [name] to continue goals of care discussion. They are pleased that [name] is able to eat and drink, as she has been requesting water. They also feel grateful that she is able to answer questions and interact more today. [name] shares that [name] is nearly back to her baseline before admission, when she rested throughout most of the day and "talks when she wants to." He understands the severity of her illness and remains hopeful that she will improve further. He states that while he was initially fearful of end of life conversations, he now feels ready to discuss hospice in more detail.   Counseled on hospice philosophy, emphasizing the prioritization of peace, comfort and dignity at end of life. Clarified the difference between home health/palliative care and home hospice. Educated on the available resources and hospice support in the home. Discussed the option  of residential hospice and family clearly states their preference for home hospice. Discussed topics such as symptom management, comfort feeds, and the process of referral to hospice. Patient's daughter Rhonda [name] would like to be her primary caregiver 24/7, as she is a CNA and worries about her father as well.  Questions and concerns addressed /palliative support was provided.  Objective Assessment: Vital Signs Vitals:   03/04/21 1950 03/05/21 0428  BP: 133/86 126/80  Pulse:    Resp:  16  Temp: 98.2 F (36.8 C) 98.4 F (36.9 C)  SpO2: 100% 99%    Intake/Output Summary (Last 24 hours) at 03/05/2021 1322 Last data filed at 03/05/2021 0600 Gross per 24 hour  Intake 2965.04 ml  Output 950 ml  Net 2015.04 ml    Last Weight  Most recent update: 02/27/2021 11:54 PM    Weight  80.7 kg (178 lb)            Gen: Very ill-appearing older African-American female CV: Regular rate and rhythm PULM: 2LPM [place] Skin: warm and dry Neuro: Alert, lethargic  SUMMARY OF RECOMMENDATIONS   -Continue current care for 2-3 days to allow patient time to improve/stabilize if possible -Patient's family have decided to discharge home with hospice at that point; discussed with Dr. Darrick Meigs, [facility] team, and [facility] hospital liaison  -Psychosocial and emotional support was provided -Chaplain support declined -The palliative medicine team will continue to follow along with [facility] during hospitalization   Time Spent: 35 minutes Greater than 50% of the time was spent in counseling and coordination of care  [place] Team Team Cell Phone: [phone] Please utilize secure chat with additional questions, if there is no response within 30 minutes please call the above phone number  Palliative Medicine Team providers are available by phone from 7am to 7pm daily and can be reached through the team cell phone.  Should this patient require assistance outside of these  hours, please call the patient's attending physician.

## 2021-03-05 NOTE — Progress Notes (Addendum)
Pharmacy Antibiotic Note  Rhonda [name] is a 60 y.o. female admitted on 02/27/2021 with shortness of breath and sacral pressure ulcer.  Pharmacy was consulted on 10/14 for  Vancomycin and Zosyn dosing for wound infection and ?aspiration pna.   Zosyn later discontinued, changed to Unasyn.  ID is following. Continuing vancomycin and unasyn for  MRSA bacteremia, unstagable sacral decubitus (no osteo, recently debrided) and sacral decubitus (no osteo, recently debrided).  ID considering antibiotic change due to thrombocytopenia, pltc 8k, but notes vancomycin seems unlikely culprit.  If Vancomycin continues will check vanc level at steady state soon per protocol. Renal function 0.78> 0.65,  estimated CrCl 75 ml/min.  S/p aspirate abscess per IR 03/04/21  Addendum:  Unasyn discontinued this AM 10/18.  Plan: Continue Vancomycin 1gm IV q24h SCr 0.8 (round up from 0.5), Vd 0.5 (BMI 31), est AUC 444.6 Follow-up ID 's antibiotic plan and check vancomycin steady state levels in next few day in vanc continued. Unasyn discontinued today.  Height: 5\' 3"  (160 cm) Weight: 80.7 kg (178 lb) IBW/kg (Calculated) : 52.4  Temp (24hrs), Avg:98.6 F (37 C), Min:98.2 F (36.8 C), Max:99.3 F (37.4 C)  Recent Labs  Lab 02/28/21 0018 03/01/21 0504 03/01/21 0945 03/01/21 1221 03/02/21 0311 03/02/21 0411 03/04/21 0415 03/05/21 0306  WBC 13.1* 13.5*  --   --   --  14.2* 6.7 7.7  CREATININE 0.50 0.52  --   --  0.78  --  0.70 0.65  LATICACIDVEN  --   --  1.6 6.0*  --   --   --   --      Estimated Creatinine Clearance: 75.2 mL/min (by C-G formula based on SCr of 0.65 mg/dL).    Allergies  Allergen Reactions   Aspirin Swelling   Augmentin [Amoxicillin-Pot Clavulanate] Swelling and Other (See Comments)    **patient has tolerated piperacillin/tazobactam, cephalexin, and ceftriaxone Hand swelled at IV site - likely extravasation    Antimicrobials this admission:  Vanc 10/14 >> Zosyn 10/14  >>10/14 Unasyn 10/15 >10/18   Dose adjustments this admission:   Microbiology results: 10/14 BCx: MRSA 10/16 BCx:  GPC clusters, pending 10/17  abscess R. Neck:  GPC pairs,chains, clusters 10/17 MRSA PCR: positive:    Thank you for allowing pharmacy to be a part of this patient's care.  Nicole Cella, RPh Clinical Pharmacist [phone] **Pharmacist phone directory can be found on [email or web address] listed under [place].  03/05/2021 9:15 AM

## 2021-03-05 NOTE — Progress Notes (Signed)
[facility]  PROGRESS NOTE  Rhonda [name] BOF:[id number] DOB: [birth year] DOA: 02/27/2021 PCP: [place] HPI:   60 year old female with medical history of hypertension, OSA not on CPAP, prediabetes, neurosarcoidosis, colostomy, seizures presents with shortness of breath. She was hospitalized in 5/22 with perforated sigmoid colon leading to emergency exploratory laparotomy with Hartman's procedure, she has delayed wound healing with wound VAC placement abdominal wound is slowly improving over time. Patient has history of neurosarcoid, debilitated, bedbound, has large sacral wound.  General surgery was consulted for debridement of sacral ulcer stage IV  Patient underwent debridement of sacral pressure ulcer, next morning patient became more lethargic.  Suspicion for sepsis was the also lactic acid was ordered initially lactic acid was 1.6, repeat lactic acid was 6.0.  Patient was started on empiric antibiotics vancomycin and Zosyn, blood cultures x 2 were obtained.  Patient's blood pressure remained stable so no IV fluid boluses were given.  Blood culture grew MRSA, so ID was consulted.  ID recommended to obtain MRI without contrast as CT chest without contrast had shown questionable abscess in the chest wall.  Initially radiology felt they were unable to safely perform MRI of chest due to implanted coils, later radiologist said they could do MR chest.  MR I chest was done which showed septic arthritis of right sternoclavicular joint.  CT surgery was consulted, and it was felt that patient is too frail with poor nutritional status to undergo surgery so CT surgeon recommended CT-guided drain placement per IR.  ENT was also consulted for extension of the chest wall abscess into neck however ENT does not feel that there is a need for any surgical intervention at this time.  IR was consulted for CT-guided drain placement, however this morning patient's platelet count is down to  6000.  I spoke to hematology/oncology, Dr. Alvy Bimler feels that this is due to antibiotics patient has been getting for sepsis.  She feels that if it is urgent to put the drain then patient should be given 2 units platelets before the procedure and 1 unit after the procedure.  Patient underwent drainage of chest wall abscess, currently on IV antibiotics.  Palliative care has discussed with family, they want to continue same treatment for 2 to 3 days and discussed with other family members before taking her home with hospice .    Subjective   Patient seen and examined, she is more alert today.  Wants to eat.   Assessment/Plan:    Severe sepsis/MRSA bacteremia -Patient presented with shortness of breath -Was seen by general surgery and underwent debridement of stage IV sacral pressure ulcer -On 03/01/2021, patient developed altered mental status -Sepsis work-up was initiated, lactic acid initially was 1.6, repeat lactic acid was 6.0 -Blood and urine cultures were obtained.  MRSA growing in all 4 bottles from blood cultures -Patient was empirically started on vancomycin and Zosyn on 03/01/2021 -Antibiotics have been changed to vancomycin and Unasyn per pharmacy -Source of infection likely decubitus ulcer versus questionable chest wall abscess, sternoclavicular joint septic arthritis -She underwent drainage of the abscess - Culture growing gram-positive cocci in clusters -As patient blood pressure was stable, no need of 30 cc/kg IV fluids to avoid fluid overload -ID is following.   ?  Chest wall abscess -CTA chest showed 7.5 cm anterior chest wall abscess of uncertain origin -Questionable sternoclavicular joint septic arthritis -MRI chest without contrast ordered by ID -Initially radiology felt that they could not do MRI chest  because of the implanted abdominal coils -However they were able to do MRI chest which confirmed septic arthritis of right sternoclavicular joint -CT surgery was  consulted, they do not feel that patient is stable enough to go for surgical intervention -CT surgery recommended CT-guided drain placement and drainage of the abscess -Spoke to the IR this morning, as patient has low platelet count of 6000.  They recommend giving 2 units of platelets before putting a drain -ENT also consulted, does not feel any need for surgical intervention for extension of the lower neck abscess, which is likely coming from the septic arthritis of right sternoclavicular joint -Patient underwent drainage of the abscess per IR, culture growing gram-positive cocci in clusters as above   Thrombocytopenia -Patient has history of chronic thrombocytopenia -She presented with platelet count of 56,000 -Since starting antibiotics platelet count has been steadily going down -Yesterday.  Count was 30,000, count was 6000 yesterday -Immature platelet fraction assay ordered -Platelet count has improved to 8000 today -No signs and symptoms of bleeding -Hematology following   Hypokalemia -Resolved   Dysphagia -Swallow evaluation obtained - patient was initially started on dysphagia 3 diet -Has been n.p.o. due to altered mental status -Started on D5 half-normal saline at 75 mill per hour -Today started on dysphagia 1 diet  Altered mental status -Slowly improving  Sacral pressure ulcer stage IV -General surgery did debridement yesterday -CT pelvis showed sacral decubitus ulcer with regional cellulitis and soft tissue emphysema -No osteomyelitis or drainable fluid collection -Wound care following  Diabetes mellitus type 2 -Recent hemoglobin A1c was 7.8 -Continue Lantus, moderate sliding scale insulin with NovoLog -CBG has been low -We will start D5 half-normal saline at 75 mill per hour -Patient was on Lantus 28 units in the morning and 25 units in p.m. -Dose of Lantus was cut down to 10 units subcu daily   History of pulmonary embolism -Eliquis was held yesterday due  to thrombocytopenia -We will continue to monitor patient's platelet count -Consider restarting Eliquis if platelet count remains stable  Hypertension -Blood pressure is stable, though on lower side -Lopressor on hold  Neurosarcoidosis -Patient is nonambulatory -Patient was started on prednisone 40 mg twice daily -Prednisone was discontinued and patient started on stress dose with Solu-Cortef 100 mg IV every 8 hours -Palliative care has been consulted  Obstructive sleep apnea -Patient not on CPAP  Goals of care Discussed with patient's husband at bedside, patient is very sick considering MRSA bacteremia/severe sepsis, altered mental status.  She is DNR/DNI, not requiring pressors at this time.  She has been started on vancomycin and Zosyn as above.  Patient family wants to take her home with hospice in next 2 to 3 days  Scheduled medications:    atorvastatin  80 mg Oral q1800   chlorhexidine  15 mL Mouth Rinse BID   Chlorhexidine Gluconate Cloth  6 each Topical Daily   collagenase   Topical BID   feeding supplement (GLUCERNA SHAKE)  237 mL Oral BID BM   ferrous sulfate  325 mg Oral Q breakfast   gabapentin  100 mg Oral TID   hydrocortisone sod succinate (SOLU-CORTEF) inj  100 mg Intravenous Q8H   insulin aspart  0-15 Units Subcutaneous TID WC   insulin aspart  0-5 Units Subcutaneous QHS   insulin glargine-yfgn  10 Units Subcutaneous QHS   mouth rinse  15 mL Mouth Rinse q12n4p   melatonin  10 mg Oral QHS   multivitamin with minerals  1 tablet Oral Daily  mupirocin ointment   Nasal BID   nystatin  5 mL Oral QID   pantoprazole  40 mg Oral Daily   silver nitrate applicators  1 application Topical Once   sodium chloride flush  3 mL Intravenous Q12H     Data Reviewed:   CBG:  Recent Labs  Lab 03/04/21 1131 03/04/21 1818 03/04/21 2226 03/05/21 0747 03/05/21 1133  GLUCAP 96 129* 142* 212* 231*    SpO2: 100 % O2 Flow Rate (L/min): 2 L/min    Vitals:   03/04/21  1700 03/04/21 1950 03/05/21 0428 03/05/21 1403  BP: 121/86 133/86 126/80 (!) 133/93  Pulse: 95   (!) 101  Resp: 16  16 16   Temp: 98.5 F (36.9 C) 98.2 F (36.8 C) 98.4 F (36.9 C) 98.3 F (36.8 C)  TempSrc: Oral Oral Oral   SpO2: 99% 100% 99% 100%  Weight:      Height:         Intake/Output Summary (Last 24 hours) at 03/05/2021 1630 Last data filed at 03/05/2021 1522 Gross per 24 hour  Intake 3867.41 ml  Output 1600 ml  Net 2267.41 ml    10/16 1901 - 10/18 0700 In: 2965 [I.V.:1743.8] Out: 950 [Urine:950]  Filed Weights   02/27/21 2353  Weight: 80.7 kg    Data Reviewed: Basic Metabolic Panel: Recent Labs  Lab 02/28/21 0018 03/01/21 0504 03/02/21 0311 03/04/21 0415 03/05/21 0306  NA 127* 131* 129* 137 142  K 2.5* 4.2 4.8 2.4* 3.7  CL 88* 90* 94* 101 104  CO2 29 30 19* 28 27  GLUCOSE 372* 120* 98 113* 186*  BUN 6 11 17 13 11   CREATININE 0.50 0.52 0.78 0.70 0.65  CALCIUM 8.1* 9.1 8.6* 7.7* 7.2*  MG 1.7  --   --  1.6*  --    Liver Function Tests: Recent Labs  Lab 03/05/21 0306  AST 52*  ALT 60*  ALKPHOS 489*  BILITOT 1.9*  PROT 4.1*  ALBUMIN <1.5*   No results for input(s): LIPASE, AMYLASE in the last 168 hours. No results for input(s): AMMONIA in the last 168 hours. CBC: Recent Labs  Lab 02/28/21 0018 03/01/21 0504 03/02/21 0411 03/04/21 0415 03/05/21 0306  WBC 13.1* 13.5* 14.2* 6.7 7.7  NEUTROABS 12.3*  --   --  5.8  --   HGB 10.8* 12.2 11.3* 9.3* 9.5*  HCT 35.1* 39.6 36.6 29.4* 29.0*  MCV 92.9 90.2 90.8 89.1 86.8  PLT 56* 40* 30* 6* 8*   Cardiac Enzymes: No results for input(s): CKTOTAL, CKMB, CKMBINDEX, TROPONINI in the last 168 hours. BNP (last 3 results) Recent Labs    11/23/20 0235 11/24/20 0450 02/28/21 0018  BNP 160.8* 152.6* 440.8*    ProBNP (last 3 results) No results for input(s): PROBNP in the last 8760 hours.  CBG: Recent Labs  Lab 03/04/21 1131 03/04/21 1818 03/04/21 2226 03/05/21 0747 03/05/21 1133   GLUCAP 96 129* 142* 212* 231*       Radiology Reports  IR US Guide Bx Asp/Drain  Result Date: 03/04/2021 INDICATION: 60 year old female referred for aspiration right sternoclavicular abscess/septic joint EXAM: ULTRASOUND-GUIDED ASPIRATION MEDICATIONS: None ANESTHESIA/SEDATION: None . COMPLICATIONS: None PROCEDURE: Informed written consent was obtained from the patient after a thorough discussion of the procedural risks, benefits and alternatives. All questions were addressed. Maximal Sterile Barrier Technique was utilized including caps, mask, sterile gowns, sterile gloves, sterile drape, hand hygiene and skin antiseptic. A timeout was performed prior to the initiation of the procedure. Ultrasound images  of the right sternoclavicular joint were performed with images stored and sent to PACs. The patient is prepped and draped in the usual sterile fashion. 1% lidocaine was used for local anesthesia. Using ultrasound guidance, a Yueh needle was advanced into the fluid collection associated with the right sternoclavicular joint. Approximately 70 cc of frankly purulent material aspirated. Culture was sent. Final image was stored. Sterile bandage was placed. Patient tolerated the procedure well and remained hemodynamically stable throughout. No complications were encountered and no significant blood loss. IMPRESSION: Status post ultrasound-guided aspiration of right sternoclavicular septic joint. Signed, Dulcy Fanny. Dellia Nims, RPVI [facility] Radiology Electronically Signed   By: Corrie Mckusick D.O.   On: 03/04/2021 17:38       Antibiotics: Anti-infectives (From admission, onward)    Start     Dose/Rate Route Frequency Ordered Stop   03/02/21 1800  Ampicillin-Sulbactam (UNASYN) 3 g in sodium chloride 0.9 % 100 mL IVPB  Status:  Discontinued        3 g 200 mL/hr over 30 Minutes Intravenous Every 6 hours 03/02/21 1652 03/05/21 0913   03/02/21 1200   vancomycin (VANCOCIN) IVPB 1000 mg/200 mL premix        1,000 mg 200 mL/hr over 60 Minutes Intravenous Every 24 hours 03/01/21 1326     03/01/21 1230  vancomycin (VANCOREADY) IVPB 1750 mg/350 mL        1,750 mg 175 mL/hr over 120 Minutes Intravenous  Once 03/01/21 1123 03/01/21 1459   03/01/21 1200  piperacillin-tazobactam (ZOSYN) IVPB 3.375 g  Status:  Discontinued        3.375 g 12.5 mL/hr over 240 Minutes Intravenous Every 8 hours 03/01/21 1123 03/02/21 0021         DVT prophylaxis: SCDs  Code Status: DNR  Family Communication: No family at bedside   Consultants: General surgery  Procedures: Debridement of sacral pressure ulcer stage IV    Objective    Physical Examination:  General-appears lethargic Heart-S1-S2, regular, no murmur auscultated Lungs-clear to auscultation bilaterally, no wheezing or crackles auscultated Abdomen-soft, nontender, no organomegaly Extremities-no edema in the lower extremities Neuro-lethargic, opens eyes to verbal stimuli  Status is: Inpatient  Dispo: The patient is from: Home              Anticipated d/c is to: To be decided              Anticipated d/c date is: 03/02/2021              Patient currently not stable for discharge  Barrier to discharge-severe sepsis  COVID-19 Labs  No results for input(s): DDIMER, FERRITIN, LDH, CRP in the last 72 hours.   Lab Results  Component Value Date   SARSCOV2NAA NEGATIVE 02/28/2021   [place] NEGATIVE 11/22/2020   [place] NEGATIVE 09/27/2020   [place] NEGATIVE 08/29/2020     Pressure Injury 02/28/21 Coccyx Mid;Lower Stage 4 - Full thickness tissue loss with exposed bone, tendon or muscle. pink wound, necrotic tissue present, drainage around sacral wound with moderate serosanguineous drainage. (Active)  02/28/21 1050  Location: Coccyx  Location Orientation: Mid;Lower  Staging: Stage 4 - Full thickness tissue loss with exposed bone, tendon or muscle.  Wound Description  (Comments): pink wound, necrotic tissue present, drainage around sacral wound with moderate serosanguineous drainage.  Present on Admission: Yes        Recent Results (from the past 240 hour(s))  Resp Panel by RT-PCR (Flu A&B, Covid) Nasopharyngeal Swab  Status: None   Collection Time: 02/28/21  1:55 AM   Specimen: Nasopharyngeal Swab; Nasopharyngeal(NP) swabs in vial transport medium  Result Value Ref Range Status   SARS Coronavirus 2 by RT PCR NEGATIVE NEGATIVE Final    Comment: (NOTE) SARS-CoV-2 target nucleic acids are NOT DETECTED.  The SARS-CoV-2 RNA is generally detectable in upper respiratory specimens during the acute phase of infection. The lowest concentration of SARS-CoV-2 viral copies this assay can detect is 138 copies/mL. A negative result does not preclude SARS-Cov-2 infection and should not be used as the sole basis for treatment or other patient management decisions. A negative result may occur with  improper specimen collection/handling, submission of specimen other than nasopharyngeal swab, presence of viral mutation(s) within the areas targeted by this assay, and inadequate number of viral copies(<138 copies/mL). A negative result must be combined with clinical observations, patient history, and epidemiological information. The expected result is Negative.  Fact Sheet for Patients:  EntrepreneurPulse.com.au  Fact Sheet for Healthcare Providers:  IncredibleEmployment.be  This test is no t yet approved or cleared by the [place] FDA and  has been authorized for detection and/or diagnosis of SARS-CoV-2 by FDA under an Emergency Use Authorization (EUA). This EUA will remain  in effect (meaning this test can be used) for the duration of the COVID-19 declaration under Section 564(b)(1) of the Act, 21 U.S.C.section 360bbb-3(b)(1), unless the authorization is terminated  or revoked sooner.       Influenza A by PCR  NEGATIVE NEGATIVE Final   Influenza B by PCR NEGATIVE NEGATIVE Final    Comment: (NOTE) The Xpert Xpress SARS-CoV-2/FLU/RSV plus assay is intended as an aid in the diagnosis of influenza from Nasopharyngeal swab specimens and should not be used as a sole basis for treatment. Nasal washings and aspirates are unacceptable for Xpert Xpress SARS-CoV-2/FLU/RSV testing.  Fact Sheet for Patients: EntrepreneurPulse.com.au  Fact Sheet for Healthcare Providers: IncredibleEmployment.be  This test is not yet approved or cleared by the [place] FDA and has been authorized for detection and/or diagnosis of SARS-CoV-2 by FDA under an Emergency Use Authorization (EUA). This EUA will remain in effect (meaning this test can be used) for the duration of the COVID-19 declaration under Section 564(b)(1) of the Act, 21 U.S.C. section 360bbb-3(b)(1), unless the authorization is terminated or revoked.  Performed at [facility], [place] [address], [place], [place] [address]   Culture, blood (routine x 2)     Status: Abnormal   Collection Time: 03/01/21  9:45 AM   Specimen: BLOOD  Result Value Ref Range Status   Specimen Description BLOOD RIGHT ANTECUBITAL  Final   Special Requests   Final    BOTTLES DRAWN AEROBIC ONLY Blood Culture adequate volume   Culture  Setup Time   Final    GRAM POSITIVE COCCI IN CLUSTERS AEROBIC BOTTLE ONLY CRITICAL VALUE NOTED.  VALUE IS CONSISTENT WITH PREVIOUSLY REPORTED AND CALLED VALUE.    Culture (A)  Final    STAPHYLOCOCCUS AUREUS SUSCEPTIBILITIES PERFORMED ON PREVIOUS CULTURE WITHIN THE LAST 5 DAYS. Performed at [facility], [place] [address] [place],  [address]    Report Status 03/04/2021 FINAL  Final  Culture, blood (routine x 2)     Status: Abnormal   Collection Time: 03/01/21  9:59 AM   Specimen: BLOOD  Result Value Ref Range Status   Specimen Description BLOOD LEFT ANTECUBITAL  Final   Special  Requests   Final    BOTTLES DRAWN AEROBIC AND  ANAEROBIC Blood Culture adequate volume   Culture  Setup Time   Final    GRAM POSITIVE COCCI IN CLUSTERS IN BOTH AEROBIC AND ANAEROBIC BOTTLES CRITICAL RESULT CALLED TO, READ BACK BY AND VERIFIED WITH: V BRYK,PHARMD@[email or web address]  03/02/21 [place] Performed at [facility], [place] [address] [place], [place] [address]    Culture METHICILLIN RESISTANT STAPHYLOCOCCUS AUREUS (A)  Final   Report Status 03/04/2021 FINAL  Final   Organism ID, Bacteria METHICILLIN RESISTANT STAPHYLOCOCCUS AUREUS  Final      Susceptibility   Methicillin resistant staphylococcus aureus - MIC*    CIPROFLOXACIN <=0.5 SENSITIVE Sensitive     ERYTHROMYCIN >=8 RESISTANT Resistant     GENTAMICIN <=0.5 SENSITIVE Sensitive     OXACILLIN >=4 RESISTANT Resistant     TETRACYCLINE <=1 SENSITIVE Sensitive     VANCOMYCIN 1 SENSITIVE Sensitive     TRIMETH/SULFA <=10 SENSITIVE Sensitive     CLINDAMYCIN <=0.25 SENSITIVE Sensitive     RIFAMPIN <=0.5 SENSITIVE Sensitive     Inducible Clindamycin NEGATIVE Sensitive     * METHICILLIN RESISTANT STAPHYLOCOCCUS AUREUS  Blood Culture ID Panel (Reflexed)     Status: Abnormal   Collection Time: 03/01/21  9:59 AM  Result Value Ref Range Status   Enterococcus faecalis NOT DETECTED NOT DETECTED Final   Enterococcus Faecium NOT DETECTED NOT DETECTED Final   Listeria monocytogenes NOT DETECTED NOT DETECTED Final   Staphylococcus species DETECTED (A) NOT DETECTED Final    Comment: CRITICAL RESULT CALLED TO, READ BACK BY AND VERIFIED WITH: V BRYK,PHARMD@0010  03/02/21 [place]    Staphylococcus aureus (BCID) DETECTED (A) NOT DETECTED Final    Comment: Methicillin (oxacillin)-resistant Staphylococcus aureus (MRSA). MRSA is predictably resistant to beta-lactam antibiotics (except ceftaroline). Preferred therapy is vancomycin unless clinically contraindicated. Patient requires contact precautions if  hospitalized. CRITICAL RESULT CALLED TO, READ BACK BY AND  VERIFIED WITH: V BRYK,PHARMD@0010  03/02/21 [place]    Staphylococcus epidermidis NOT DETECTED NOT DETECTED Final   Staphylococcus lugdunensis NOT DETECTED NOT DETECTED Final   Streptococcus species NOT DETECTED NOT DETECTED Final   Streptococcus agalactiae NOT DETECTED NOT DETECTED Final   Streptococcus pneumoniae NOT DETECTED NOT DETECTED Final   Streptococcus pyogenes NOT DETECTED NOT DETECTED Final   A.calcoaceticus-baumannii NOT DETECTED NOT DETECTED Final   Bacteroides fragilis NOT DETECTED NOT DETECTED Final   Enterobacterales NOT DETECTED NOT DETECTED Final   Enterobacter cloacae complex NOT DETECTED NOT DETECTED Final   Escherichia coli NOT DETECTED NOT DETECTED Final   Klebsiella aerogenes NOT DETECTED NOT DETECTED Final   Klebsiella oxytoca NOT DETECTED NOT DETECTED Final   Klebsiella pneumoniae NOT DETECTED NOT DETECTED Final   Proteus species NOT DETECTED NOT DETECTED Final   Salmonella species NOT DETECTED NOT DETECTED Final   Serratia marcescens NOT DETECTED NOT DETECTED Final   Haemophilus influenzae NOT DETECTED NOT DETECTED Final   Neisseria meningitidis NOT DETECTED NOT DETECTED Final   Pseudomonas aeruginosa NOT DETECTED NOT DETECTED Final   Stenotrophomonas maltophilia NOT DETECTED NOT DETECTED Final   Candida albicans NOT DETECTED NOT DETECTED Final   Candida auris NOT DETECTED NOT DETECTED Final   Candida glabrata NOT DETECTED NOT DETECTED Final   Candida krusei NOT DETECTED NOT DETECTED Final   Candida parapsilosis NOT DETECTED NOT DETECTED Final   Candida tropicalis NOT DETECTED NOT DETECTED Final   Cryptococcus neoformans/gattii NOT DETECTED NOT DETECTED Final   Meth resistant mecA/C and MREJ DETECTED (A) NOT DETECTED Final    Comment: CRITICAL RESULT CALLED TO, READ BACK BY  AND VERIFIED WITH: V BRYK,PHARMD@0010  03/02/21 [place] Performed at [facility], [place] [address] [place], [place] [address]   Culture, blood (routine x 2)     Status: None (Preliminary  result)   Collection Time: 03/03/21  4:33 AM   Specimen: BLOOD RIGHT HAND  Result Value Ref Range Status   Specimen Description BLOOD RIGHT HAND  Final   Special Requests   Final    BOTTLES DRAWN AEROBIC ONLY Blood Culture results may not be optimal due to an inadequate volume of blood received in culture bottles   Culture  Setup Time   Final    GRAM POSITIVE COCCI IN CLUSTERS AEROBIC BOTTLE ONLY CRITICAL VALUE NOTED.  VALUE IS CONSISTENT WITH PREVIOUSLY REPORTED AND CALLED VALUE.    Culture   Final    NO GROWTH 2 DAYS Performed at [facility], [place] [address], [place], [place] [address]    Report Status PENDING  Incomplete  Culture, blood (routine x 2)     Status: Abnormal (Preliminary result)   Collection Time: 03/03/21  4:58 AM   Specimen: BLOOD LEFT HAND  Result Value Ref Range Status   Specimen Description BLOOD LEFT HAND  Final   Special Requests   Final    BOTTLES DRAWN AEROBIC ONLY Blood Culture results may not be optimal due to an inadequate volume of blood received in culture bottles   Culture  Setup Time   Final    GRAM POSITIVE COCCI IN CLUSTERS AEROBIC BOTTLE ONLY CRITICAL VALUE NOTED.  VALUE IS CONSISTENT WITH PREVIOUSLY REPORTED AND CALLED VALUE.    Culture (A)  Final    STAPHYLOCOCCUS AUREUS SUSCEPTIBILITIES PERFORMED ON PREVIOUS CULTURE WITHIN THE LAST 5 DAYS. Performed at [facility], [place] [address], [place], [place] [address]    Report Status PENDING  Incomplete  MRSA Next Gen by PCR, Nasal     Status: Abnormal   Collection Time: 03/04/21 11:27 AM   Specimen: Nasal Mucosa; Nasal Swab  Result Value Ref Range Status   MRSA by PCR Next Gen DETECTED (A) NOT DETECTED Final    Comment: RESULT CALLED TO, READ BACK BY AND VERIFIED WITH: RN S MIGANTOS [id number] AT 1311 BY CM (NOTE) The GeneXpert MRSA Assay (FDA approved for NASAL specimens only), is one component of a comprehensive MRSA colonization surveillance program. It is not intended to diagnose  MRSA infection nor to guide or monitor treatment for MRSA infections. Test performance is not FDA approved in patients less than 2 years old. Performed at [facility], [place] [address], [place], [place] [address]   Aerobic/Anaerobic Culture w Gram Stain (surgical/deep wound)     Status: None (Preliminary result)   Collection Time: 03/04/21  5:20 PM   Specimen: Abscess  Result Value Ref Range Status   Specimen Description ABSCESS RIGHT NECK  Final   Special Requests NONE  Final   Gram Stain   Final    ABUNDANT WBC PRESENT, PREDOMINANTLY MONONUCLEAR MODERATE GRAM POSITIVE COCCI IN PAIRS AND CHAINS FEW GRAM POSITIVE COCCI IN CLUSTERS    Culture   Final    TOO YOUNG TO READ Performed at [facility], [place] [address], [place],  [address]    Report Status PENDING  Incomplete    Oswald Hillock   [facility] If 7PM-7AM, please contact night-coverage at [email or web address], Office  [phone]   03/05/2021, 4:30 PM  LOS: 5 days

## 2021-03-06 LAB — CBC
HCT: 29.5 % — ABNORMAL LOW (ref 36.0–46.0)
Hemoglobin: 9.3 g/dL — ABNORMAL LOW (ref 12.0–15.0)
MCH: 28 pg (ref 26.0–34.0)
MCHC: 31.5 g/dL (ref 30.0–36.0)
MCV: 88.9 fL (ref 80.0–100.0)
Platelets: 12 10*3/uL — CL (ref 150–400)
RBC: 3.32 MIL/uL — ABNORMAL LOW (ref 3.87–5.11)
RDW: 17.2 % — ABNORMAL HIGH (ref 11.5–15.5)
WBC: 8.1 10*3/uL (ref 4.0–10.5)
nRBC: 1.5 % — ABNORMAL HIGH (ref 0.0–0.2)

## 2021-03-06 LAB — COMPREHENSIVE METABOLIC PANEL
ALT: 56 U/L — ABNORMAL HIGH (ref 0–44)
AST: 48 U/L — ABNORMAL HIGH (ref 15–41)
Albumin: <1.5 g/dL — ABNORMAL LOW (ref 3.5–5.0)
Alkaline Phosphatase: 631 U/L — ABNORMAL HIGH (ref 38–126)
Anion gap: 12 (ref 5–15)
BUN: 9 mg/dL (ref 6–20)
CO2: 25 mmol/L (ref 22–32)
Calcium: 7 mg/dL — ABNORMAL LOW (ref 8.9–10.3)
Chloride: 100 mmol/L (ref 98–111)
Creatinine, Ser: 0.68 mg/dL (ref 0.44–1.00)
GFR, Estimated: >60 mL/min (ref >60)
Glucose, Bld: 311 mg/dL — ABNORMAL HIGH (ref 70–99)
Potassium: 2.6 mmol/L — CL (ref 3.5–5.1)
Sodium: 137 mmol/L (ref 135–145)
Total Bilirubin: 2.3 mg/dL — ABNORMAL HIGH (ref 0.3–1.2)
Total Protein: 4.2 g/dL — ABNORMAL LOW (ref 6.5–8.1)

## 2021-03-06 LAB — CULTURE, BLOOD (ROUTINE X 2)

## 2021-03-06 LAB — GLUCOSE, CAPILLARY
Glucose-Capillary: 249 mg/dL — ABNORMAL HIGH (ref 70–99)
Glucose-Capillary: 208 mg/dL — ABNORMAL HIGH (ref 70–99)
Glucose-Capillary: 198 mg/dL — ABNORMAL HIGH (ref 70–99)

## 2021-03-06 LAB — VANCOMYCIN, PEAK: Vancomycin Pk: 30 ug/mL (ref 30–40)

## 2021-03-06 MED ORDER — MORPHINE SULFATE 10 MG/5ML PO SOLN
5.0000 mg | ORAL | Status: DC | PRN
  Administered 2021-03-07: 5 mg via ORAL
  Filled 2021-03-06: qty 4

## 2021-03-06 MED ORDER — HYDROCORTISONE SOD SUC (PF) 100 MG IJ SOLR
100.0000 mg | Freq: Two times a day (BID) | INTRAMUSCULAR | Status: DC
  Administered 2021-03-06 – 2021-03-07 (×2): 100 mg via INTRAVENOUS
  Filled 2021-03-06 (×2): qty 2

## 2021-03-06 MED ORDER — POTASSIUM CHLORIDE 10 MEQ/100ML IV SOLN
10.0000 meq | INTRAVENOUS | Status: AC
Start: 2021-03-06 — End: 2021-03-06
  Administered 2021-03-06 (×6): 10 meq via INTRAVENOUS
  Filled 2021-03-06 (×6): qty 100

## 2021-03-06 MED ORDER — INSULIN GLARGINE-YFGN 100 UNIT/ML ~~LOC~~ SOLN
16.0000 [IU] | Freq: Every day | SUBCUTANEOUS | Status: DC
  Administered 2021-03-06 – 2021-03-07 (×2): 16 [IU] via SUBCUTANEOUS
  Filled 2021-03-06 (×3): qty 0.16

## 2021-03-06 MED ORDER — MAGNESIUM SULFATE 2 GM/50ML IV SOLN
2.0000 g | Freq: Once | INTRAVENOUS | Status: AC
  Administered 2021-03-06: 2 g via INTRAVENOUS
  Filled 2021-03-06: qty 50

## 2021-03-06 NOTE — TOC Progression Note (Signed)
Transition of Care [facility]) - Progression Note    Patient Details  Name: Rhonda [name] MRN: [id number] Date of Birth: 19-May-1961  Transition of Care [facility]) CM/SW Contact  Joanne Chars, LCSW Phone Number: 03/06/2021, 1:30 PM  Clinical Narrative:  CSW spoke with [place] at [facility].  She did meet with family, they are moving forward with plan for home hospice.  Working on DME and getting things set up at home, planning on Friday DC.       Expected Discharge Plan: Home w Hospice Care Barriers to Discharge: Continued Medical Work up  Expected Discharge Plan and Services Expected Discharge Plan: [facility] Choice: Hospice Living arrangements for the past 2 months: Single Family Home                                       Social Determinants of Health (SDOH) Interventions    Readmission Risk Interventions Readmission Risk Prevention Plan 11/26/2020 09/12/2020 09/04/2020  Transportation Screening Complete Complete Complete  Medication Review Press photographer) Complete - Referral to Pharmacy  PCP or Specialist appointment within 3-5 days of discharge Complete Complete Complete  HRI or Home Care Consult Complete Complete Complete  SW Recovery Care/Counseling Consult Complete Complete Complete  Palliative Care Screening Not Applicable Not Applicable Not Applicable  Skilled Nursing Facility Not Applicable Complete Complete  Some recent data might be hidden

## 2021-03-06 NOTE — Progress Notes (Signed)
Date and time results received: 03/06/21 0500 (use smartphrase ".now" to insert current time)  Test: Potassium Critical Value: 2.9  Name of Provider Notified: [place]  Orders Received?: New orders. Started potassium fluids

## 2021-03-06 NOTE — Progress Notes (Signed)
Inpatient Diabetes Program Recommendations  AACE/ADA: New Consensus Statement on Inpatient Glycemic Control (2015)  Target Ranges:  Prepandial:   less than 140 mg/dL      Peak postprandial:   less than 180 mg/dL (1-2 hours)      Critically ill patients:  140 - 180 mg/dL   Lab Results  Component Value Date   GLUCAP 249 (H) 03/06/2021   HGBA1C 7.8 (H) 11/22/2020    Review of Glycemic Control Results for KARENE, BRACKEN (MRN [id number]) as of 03/06/2021 11:02  Ref. Range 03/05/2021 07:47 03/05/2021 11:33 03/05/2021 16:39 03/05/2021 20:07 03/06/2021 08:12  Glucose-Capillary Latest Ref Range: 70 - 99 mg/dL 212 (H) 231 (H) 272 (H) 285 (H) 249 (H)   Diabetes history: DM 2 Outpatient Diabetes medications:  Novolog 1-20 units tid with meals Lantus 25-28 units bid Current orders for Inpatient glycemic control:  Novolog moderate tid with meals and HS Solucortef 100 mg bid Semglee 10 units q HS Inpatient Diabetes Program Recommendations:   If appropriate, consider increasing Semglee to 16 units q HS.   Thanks,  Adah Perl, RN, BC-ADM Inpatient Diabetes Coordinator Pager [phone]  (8a-5p)

## 2021-03-06 NOTE — Plan of Care (Signed)
  Problem: Education: Goal: Knowledge of General Education information will improve Description: Including pain rating scale, medication(s)/side effects and non-pharmacologic comfort measures Outcome: Progressing   Problem: Clinical Measurements: Goal: Ability to maintain clinical measurements within normal limits will improve Outcome: Progressing Goal: Diagnostic test results will improve Outcome: Progressing   Problem: Nutrition: Goal: Adequate nutrition will be maintained Outcome: Progressing   Problem: Coping: Goal: Level of anxiety will decrease Outcome: Progressing

## 2021-03-06 NOTE — Progress Notes (Signed)
Progress Note    Rhonda [name]  BZJ:[id number] DOB: 20-Nov-1960  DOA: 02/27/2021 PCP: [facility].    Brief Narrative:    Medical records reviewed and are as summarized below:  Rhonda [name] is an 60 y.o. female with medical history of hypertension, OSA not on CPAP, prediabetes, neurosarcoidosis, colostomy, seizures presents with shortness of breath. She was hospitalized in 5/22 with perforated sigmoid colon leading to emergency exploratory laparotomy with Hartman's procedure, she has delayed wound healing with wound VAC placement abdominal wound is slowly improving over time. Patient has history of neurosarcoid, debilitated, bedbound, has large sacral wound.   Assessment/Plan:   Principal Problem:   Shortness of breath Active Problems:   Essential hypertension   OSA (obstructive sleep apnea)   HLD (hyperlipidemia)   Neurosarcoidosis   Type 2 diabetes mellitus without complication, with long-term current use of insulin (HCC)   Unstageable pressure ulcer of sacral region ([place])   Hypokalemia   DNR (do not resuscitate)   Malnutrition of moderate degree   Chest wall abscess   MRSA bacteremia   Acute respiratory failure with hypoxia (HCC)   Goals of care, counseling/discussion   Thrombocytopenia (HCC)    Severe sepsis/MRSA bacteremia -Was seen by general surgery and underwent debridement of stage IV sacral pressure ulcer -Sepsis work-up was initiated, lactic acid initially was 1.6, repeat lactic acid was 6.0 -Blood and urine cultures were obtained.  MRSA growing in all 4 bottles from blood cultures -Patient was empirically started on vancomycin and Zosyn on 03/01/2021 -Antibiotics have been changed to vancomycin and Unasyn per pharmacy -Source of infection likely decubitus ulcer versus questionable chest wall abscess, sternoclavicular joint septic arthritis -She underwent drainage of the abscess - Culture growing gram-positive cocci in clusters -ID is  following for abx   Chest wall abscess -CTA chest showed 7.5 cm anterior chest wall abscess of uncertain origin -Questionable sternoclavicular joint septic arthritis -MRI chest without contrast ordered by ID -Initially radiology felt that they could not do MRI chest because of the implanted abdominal coils -However they were able to do MRI chest which confirmed septic arthritis of right sternoclavicular joint -CT surgery was consulted, they do not feel that patient is stable enough to go for surgical intervention -CT surgery recommended CT-guided drain placement and drainage of the abscess -Spoke to the IR this morning, as patient has low platelet count of 6000.  They recommend giving 2 units of platelets before putting a drain -ENT also consulted, does not feel any need for surgical intervention for extension of the lower neck abscess, which is likely coming from the septic arthritis of right sternoclavicular joint -Patient underwent drainage of the abscess per IR on 10/17, culture growing staph a.   Thrombocytopenia -Patient has history of chronic thrombocytopenia -She presented with platelet count of 56,000 -Since starting antibiotics platelet count has been steadily going down  per oncology: For now, I recommend platelet transfusion if platelet count is less than 10,000 or active bleeding   Hypokalemia -Replete along with Mg     Dysphagia -Swallow evaluation obtained - DYS diet   Altered mental status -per family seems to be at baseline   Sacral pressure ulcer stage IV -General surgery did debridement  -CT pelvis showed sacral decubitus ulcer with regional cellulitis and soft tissue emphysema -No osteomyelitis or drainable fluid collection -PT changing Hydrotherapy to 3 x week.  Bedside RN to perform dressing changes on Sunday, Tues, [place] and Sat. WOC will coordinate with  PT on Friday and re-assess the wound.   Diabetes mellitus type 2 -Recent hemoglobin A1c was  7.8 -Continue Lantus, moderate sliding scale insulin with NovoLog   History of pulmonary embolism -Eliquis was held yesterday due to thrombocytopenia -transitioned to comfort care -held due to low plts  Neurosarcoidosis -Patient is nonambulatory -wean to PO steroids (40 mg BID at home)   Obesity Estimated body mass index is 31.53 kg/m as calculated from the following:   Height as of this encounter: 5\' 3"  (1.6 m).   Weight as of this encounter: 80.7 kg.   Obstructive sleep apnea -Patient not on CPAP   Goals of care Discussed with patient's husband at bedside, patient is very sick considering MRSA bacteremia/severe sepsis, altered mental status.  She is DNR/DNI, not requiring pressors at this time.  She has been started on vancomycin and Zosyn as above.  Patient family wants to take her home with hospice plan for Friday -will place foley   Family Communication/Anticipated D/C date and plan/Code Status   DVT prophylaxis: Lovenox ordered. Code Status: DNR Family Communication: daughter/husband at bedside Disposition Plan: Status is: Inpatient  Remains inpatient appropriate because: need for abx         Medical Consultants:   ID GS Palliative care  Subjective:   Says she wants to go home  Objective:    Vitals:   03/05/21 0428 03/05/21 1403 03/06/21 0534 03/06/21 1217  BP: 126/80 (!) 133/93 127/80 (!) 144/112  Pulse:  (!) 101 88 (!) 108  Resp: 16 16 19 18   Temp: 98.4 F (36.9 C) 98.3 F (36.8 C) 98.5 F (36.9 C) 97.7 F (36.5 C)  TempSrc: Oral  Oral Oral  SpO2: 99% 100% 92%   Weight:      Height:        Intake/Output Summary (Last 24 hours) at 03/06/2021 1315 Last data filed at 03/05/2021 1522 Gross per 24 hour  Intake 902.37 ml  Output 650 ml  Net 252.37 ml   Filed Weights   02/27/21 2353  Weight: 80.7 kg    Exam:  General: Appearance:    Obese, chronically ill apeparing female in no acute distress     Lungs:      respirations unlabored   Heart:    Tachycardic.   MS:   All extremities are intact.    Neurologic:   Awake, speech difficult to understand      Data Reviewed:   I have personally reviewed following labs and imaging studies:  Labs: Labs show the following:   Basic Metabolic Panel: Recent Labs  Lab 02/28/21 0018 03/01/21 0504 03/02/21 0311 03/04/21 0415 03/05/21 0306 03/06/21 0317  NA 127* 131* 129* 137 142 137  K 2.5* 4.2 4.8 2.4* 3.7 2.6*  CL 88* 90* 94* 101 104 100  CO2 29 30 19* 28 27 25   GLUCOSE 372* 120* 98 113* 186* 311*  BUN 6 11 17 13 11 9   CREATININE 0.50 0.52 0.78 0.70 0.65 0.68  CALCIUM 8.1* 9.1 8.6* 7.7* 7.2* 7.0*  MG 1.7  --   --  1.6*  --   --    GFR Estimated Creatinine Clearance: 75.2 mL/min (by C-G formula based on SCr of 0.68 mg/dL). Liver Function Tests: Recent Labs  Lab 03/05/21 0306 03/06/21 0317  AST 52* 48*  ALT 60* 56*  ALKPHOS 489* 631*  BILITOT 1.9* 2.3*  PROT 4.1* 4.2*  ALBUMIN <1.5* <1.5*   No results for input(s): LIPASE, AMYLASE in the last 168  hours. No results for input(s): AMMONIA in the last 168 hours. Coagulation profile Recent Labs  Lab 03/04/21 0415  INR 1.2    CBC: Recent Labs  Lab 02/28/21 0018 03/01/21 0504 03/02/21 0411 03/04/21 0415 03/05/21 0306 03/06/21 0317  WBC 13.1* 13.5* 14.2* 6.7 7.7 8.1  NEUTROABS 12.3*  --   --  5.8  --   --   HGB 10.8* 12.2 11.3* 9.3* 9.5* 9.3*  HCT 35.1* 39.6 36.6 29.4* 29.0* 29.5*  MCV 92.9 90.2 90.8 89.1 86.8 88.9  PLT 56* 40* 30* 6* 8* 12*   Cardiac Enzymes: No results for input(s): CKTOTAL, CKMB, CKMBINDEX, TROPONINI in the last 168 hours. BNP (last 3 results) No results for input(s): PROBNP in the last 8760 hours. CBG: Recent Labs  Lab 03/05/21 1133 03/05/21 1639 03/05/21 2007 03/06/21 0812 03/06/21 1216  GLUCAP 231* 272* 285* 249* 208*   D-Dimer: No results for input(s): DDIMER in the last 72 hours. Hgb A1c: No results for input(s): HGBA1C in the last 72 hours. Lipid  Profile: No results for input(s): CHOL, HDL, LDLCALC, TRIG, CHOLHDL, LDLDIRECT in the last 72 hours. Thyroid function studies: No results for input(s): TSH, T4TOTAL, T3FREE, THYROIDAB in the last 72 hours.  Invalid input(s): FREET3 Anemia work up: No results for input(s): VITAMINB12, FOLATE, FERRITIN, TIBC, IRON, RETICCTPCT in the last 72 hours. Sepsis Labs: Recent Labs  Lab 03/01/21 0945 03/01/21 1221 03/02/21 0411 03/04/21 0415 03/05/21 0306 03/06/21 0317  WBC  --   --  14.2* 6.7 7.7 8.1  LATICACIDVEN 1.6 6.0*  --   --   --   --     Microbiology Recent Results (from the past 240 hour(s))  Resp Panel by RT-PCR (Flu A&B, Covid) Nasopharyngeal Swab     Status: None   Collection Time: 02/28/21  1:55 AM   Specimen: Nasopharyngeal Swab; Nasopharyngeal(NP) swabs in vial transport medium  Result Value Ref Range Status   SARS Coronavirus 2 by RT PCR NEGATIVE NEGATIVE Final    Comment: (NOTE) SARS-CoV-2 target nucleic acids are NOT DETECTED.  The SARS-CoV-2 RNA is generally detectable in upper respiratory specimens during the acute phase of infection. The lowest concentration of SARS-CoV-2 viral copies this assay can detect is 138 copies/mL. A negative result does not preclude SARS-Cov-2 infection and should not be used as the sole basis for treatment or other patient management decisions. A negative result may occur with  improper specimen collection/handling, submission of specimen other than nasopharyngeal swab, presence of viral mutation(s) within the areas targeted by this assay, and inadequate number of viral copies(<138 copies/mL). A negative result must be combined with clinical observations, patient history, and epidemiological information. The expected result is Negative.  Fact Sheet for Patients:  EntrepreneurPulse.com.au  Fact Sheet for Healthcare Providers:  IncredibleEmployment.be  This test is no t yet approved or cleared by  the [place] FDA and  has been authorized for detection and/or diagnosis of SARS-CoV-2 by FDA under an Emergency Use Authorization (EUA). This EUA will remain  in effect (meaning this test can be used) for the duration of the COVID-19 declaration under Section 564(b)(1) of the Act, 21 U.S.C.section 360bbb-3(b)(1), unless the authorization is terminated  or revoked sooner.       Influenza A by PCR NEGATIVE NEGATIVE Final   Influenza B by PCR NEGATIVE NEGATIVE Final    Comment: (NOTE) The Xpert Xpress SARS-CoV-2/FLU/RSV plus assay is intended as an aid in the diagnosis of influenza from Nasopharyngeal swab specimens and should not  be used as a sole basis for treatment. Nasal washings and aspirates are unacceptable for Xpert Xpress SARS-CoV-2/FLU/RSV testing.  Fact Sheet for Patients: EntrepreneurPulse.com.au  Fact Sheet for Healthcare Providers: IncredibleEmployment.be  This test is not yet approved or cleared by the [place] FDA and has been authorized for detection and/or diagnosis of SARS-CoV-2 by FDA under an Emergency Use Authorization (EUA). This EUA will remain in effect (meaning this test can be used) for the duration of the COVID-19 declaration under Section 564(b)(1) of the Act, 21 U.S.C. section 360bbb-3(b)(1), unless the authorization is terminated or revoked.  Performed at [facility], [place] [address], [place], [place] [address]   Culture, blood (routine x 2)     Status: Abnormal   Collection Time: 03/01/21  9:45 AM   Specimen: BLOOD  Result Value Ref Range Status   Specimen Description BLOOD RIGHT ANTECUBITAL  Final   Special Requests   Final    BOTTLES DRAWN AEROBIC ONLY Blood Culture adequate volume   Culture  Setup Time   Final    GRAM POSITIVE COCCI IN CLUSTERS AEROBIC BOTTLE ONLY CRITICAL VALUE NOTED.  VALUE IS CONSISTENT WITH PREVIOUSLY REPORTED AND CALLED VALUE.    Culture (A)  Final     STAPHYLOCOCCUS AUREUS SUSCEPTIBILITIES PERFORMED ON PREVIOUS CULTURE WITHIN THE LAST 5 DAYS. Performed at [facility], [place] [address] [place], [place] [address]    Report Status 03/04/2021 FINAL  Final  Culture, blood (routine x 2)     Status: Abnormal   Collection Time: 03/01/21  9:59 AM   Specimen: BLOOD  Result Value Ref Range Status   Specimen Description BLOOD LEFT ANTECUBITAL  Final   Special Requests   Final    BOTTLES DRAWN AEROBIC AND ANAEROBIC Blood Culture adequate volume   Culture  Setup Time   Final    GRAM POSITIVE COCCI IN CLUSTERS IN BOTH AEROBIC AND ANAEROBIC BOTTLES CRITICAL RESULT CALLED TO, READ BACK BY AND VERIFIED WITH: V BRYK,PHARMD@0010  03/02/21 [place] Performed at [facility], [place] [address], [place],  [address]    Culture METHICILLIN RESISTANT STAPHYLOCOCCUS AUREUS (A)  Final   Report Status 03/04/2021 FINAL  Final   Organism ID, Bacteria METHICILLIN RESISTANT STAPHYLOCOCCUS AUREUS  Final      Susceptibility   Methicillin resistant staphylococcus aureus - MIC*    CIPROFLOXACIN <=0.5 SENSITIVE Sensitive     ERYTHROMYCIN >=8 RESISTANT Resistant     GENTAMICIN <=0.5 SENSITIVE Sensitive     OXACILLIN >=4 RESISTANT Resistant     TETRACYCLINE <=1 SENSITIVE Sensitive     VANCOMYCIN 1 SENSITIVE Sensitive     TRIMETH/SULFA <=10 SENSITIVE Sensitive     CLINDAMYCIN <=0.25 SENSITIVE Sensitive     RIFAMPIN <=0.5 SENSITIVE Sensitive     Inducible Clindamycin NEGATIVE Sensitive     * METHICILLIN RESISTANT STAPHYLOCOCCUS AUREUS  Blood Culture ID Panel (Reflexed)     Status: Abnormal   Collection Time: 03/01/21  9:59 AM  Result Value Ref Range Status   Enterococcus faecalis NOT DETECTED NOT DETECTED Final   Enterococcus Faecium NOT DETECTED NOT DETECTED Final   Listeria monocytogenes NOT DETECTED NOT DETECTED Final   Staphylococcus species DETECTED (A) NOT DETECTED Final    Comment: CRITICAL RESULT CALLED TO, READ BACK BY AND VERIFIED WITH: V  BRYK,PHARMD@0010  03/02/21 [place]    Staphylococcus aureus (BCID) DETECTED (A) NOT DETECTED Final    Comment: Methicillin (oxacillin)-resistant Staphylococcus aureus (MRSA). MRSA is predictably resistant to beta-lactam antibiotics (except ceftaroline).  Preferred therapy is vancomycin unless clinically contraindicated. Patient requires contact precautions if  hospitalized. CRITICAL RESULT CALLED TO, READ BACK BY AND VERIFIED WITH: V BRYK,PHARMD@0010  03/02/21 [place]    Staphylococcus epidermidis NOT DETECTED NOT DETECTED Final   Staphylococcus lugdunensis NOT DETECTED NOT DETECTED Final   Streptococcus species NOT DETECTED NOT DETECTED Final   Streptococcus agalactiae NOT DETECTED NOT DETECTED Final   Streptococcus pneumoniae NOT DETECTED NOT DETECTED Final   Streptococcus pyogenes NOT DETECTED NOT DETECTED Final   A.calcoaceticus-baumannii NOT DETECTED NOT DETECTED Final   Bacteroides fragilis NOT DETECTED NOT DETECTED Final   Enterobacterales NOT DETECTED NOT DETECTED Final   Enterobacter cloacae complex NOT DETECTED NOT DETECTED Final   Escherichia coli NOT DETECTED NOT DETECTED Final   Klebsiella aerogenes NOT DETECTED NOT DETECTED Final   Klebsiella oxytoca NOT DETECTED NOT DETECTED Final   Klebsiella pneumoniae NOT DETECTED NOT DETECTED Final   Proteus species NOT DETECTED NOT DETECTED Final   Salmonella species NOT DETECTED NOT DETECTED Final   Serratia marcescens NOT DETECTED NOT DETECTED Final   Haemophilus influenzae NOT DETECTED NOT DETECTED Final   Neisseria meningitidis NOT DETECTED NOT DETECTED Final   Pseudomonas aeruginosa NOT DETECTED NOT DETECTED Final   Stenotrophomonas maltophilia NOT DETECTED NOT DETECTED Final   Candida albicans NOT DETECTED NOT DETECTED Final   Candida auris NOT DETECTED NOT DETECTED Final   Candida glabrata NOT DETECTED NOT DETECTED Final   Candida krusei NOT DETECTED NOT DETECTED Final   Candida parapsilosis NOT DETECTED NOT DETECTED Final   Candida  tropicalis NOT DETECTED NOT DETECTED Final   Cryptococcus neoformans/gattii NOT DETECTED NOT DETECTED Final   Meth resistant mecA/C and MREJ DETECTED (A) NOT DETECTED Final    Comment: CRITICAL RESULT CALLED TO, READ BACK BY AND VERIFIED WITH: V BRYK,PHARMD@0010  03/02/21 [place] Performed at [facility], [address] [place], [place] [address]   Culture, blood (routine x 2)     Status: Abnormal   Collection Time: 03/03/21  4:33 AM   Specimen: BLOOD RIGHT HAND  Result Value Ref Range Status   Specimen Description BLOOD RIGHT HAND  Final   Special Requests   Final    BOTTLES DRAWN AEROBIC ONLY Blood Culture results may not be optimal due to an inadequate volume of blood received in culture bottles   Culture  Setup Time   Final    GRAM POSITIVE COCCI IN CLUSTERS AEROBIC BOTTLE ONLY CRITICAL VALUE NOTED.  VALUE IS CONSISTENT WITH PREVIOUSLY REPORTED AND CALLED VALUE.    Culture (A)  Final    STAPHYLOCOCCUS AUREUS SUSCEPTIBILITIES PERFORMED ON PREVIOUS CULTURE WITHIN THE LAST 5 DAYS. Performed at [facility], [place] [address] [place], [place] [address]    Report Status 03/06/2021 FINAL  Final  Culture, blood (routine x 2)     Status: Abnormal   Collection Time: 03/03/21  4:58 AM   Specimen: BLOOD LEFT HAND  Result Value Ref Range Status   Specimen Description BLOOD LEFT HAND  Final   Special Requests   Final    BOTTLES DRAWN AEROBIC ONLY Blood Culture results may not be optimal due to an inadequate volume of blood received in culture bottles   Culture  Setup Time   Final    GRAM POSITIVE COCCI IN CLUSTERS AEROBIC BOTTLE ONLY CRITICAL VALUE NOTED.  VALUE IS CONSISTENT WITH PREVIOUSLY REPORTED AND CALLED VALUE.    Culture (A)  Final    STAPHYLOCOCCUS AUREUS SUSCEPTIBILITIES PERFORMED ON PREVIOUS CULTURE WITHIN THE  LAST 5 DAYS. Performed at [facility], [place] [address], [place], [place] [address]    Report Status 03/06/2021 FINAL  Final  MRSA Next Gen by PCR, Nasal      Status: Abnormal   Collection Time: 03/04/21 11:27 AM   Specimen: Nasal Mucosa; Nasal Swab  Result Value Ref Range Status   MRSA by PCR Next Gen DETECTED (A) NOT DETECTED Final    Comment: RESULT CALLED TO, READ BACK BY AND VERIFIED WITH: RN S MIGANTOS [id number] AT 1311 BY CM (NOTE) The GeneXpert MRSA Assay (FDA approved for NASAL specimens only), is one component of a comprehensive MRSA colonization surveillance program. It is not intended to diagnose MRSA infection nor to guide or monitor treatment for MRSA infections. Test performance is not FDA approved in patients less than 59 years old. Performed at [facility], [place] [address], [place], [place] [address]   Aerobic/Anaerobic Culture w Gram Stain (surgical/deep wound)     Status: None (Preliminary result)   Collection Time: 03/04/21  5:20 PM   Specimen: Abscess  Result Value Ref Range Status   Specimen Description ABSCESS RIGHT NECK  Final   Special Requests NONE  Final   Gram Stain   Final    ABUNDANT WBC PRESENT, PREDOMINANTLY MONONUCLEAR MODERATE GRAM POSITIVE COCCI IN PAIRS AND CHAINS FEW GRAM POSITIVE COCCI IN CLUSTERS Performed at [facility], [place] [address], [place], [place] [address]    Culture   Final    ABUNDANT STAPHYLOCOCCUS AUREUS SUSCEPTIBILITIES TO FOLLOW NO ANAEROBES ISOLATED; CULTURE IN PROGRESS FOR 5 DAYS    Report Status PENDING  Incomplete    Procedures and diagnostic studies:  IR US Guide Bx Asp/Drain  Result Date: 03/04/2021 INDICATION: 60 year old female referred for aspiration right sternoclavicular abscess/septic joint EXAM: ULTRASOUND-GUIDED ASPIRATION MEDICATIONS: None ANESTHESIA/SEDATION: None . COMPLICATIONS: None PROCEDURE: Informed written consent was obtained from the patient after a thorough discussion of the procedural risks, benefits and alternatives. All questions were addressed. Maximal Sterile Barrier Technique was utilized including caps, mask, sterile gowns, sterile  gloves, sterile drape, hand hygiene and skin antiseptic. A timeout was performed prior to the initiation of the procedure. Ultrasound images of the right sternoclavicular joint were performed with images stored and sent to PACs. The patient is prepped and draped in the usual sterile fashion. 1% lidocaine was used for local anesthesia. Using ultrasound guidance, a Yueh needle was advanced into the fluid collection associated with the right sternoclavicular joint. Approximately 70 cc of frankly purulent material aspirated. Culture was sent. Final image was stored. Sterile bandage was placed. Patient tolerated the procedure well and remained hemodynamically stable throughout. No complications were encountered and no significant blood loss. IMPRESSION: Status post ultrasound-guided aspiration of right sternoclavicular septic joint. Signed, Dulcy Fanny. Dellia Nims, RPVI [facility] Electronically Signed   By: Corrie Mckusick D.O.   On: 03/04/2021 17:38    Medications:    atorvastatin  80 mg Oral q1800   chlorhexidine  15 mL Mouth Rinse BID   Chlorhexidine Gluconate Cloth  6 each Topical Daily   collagenase   Topical BID   feeding supplement (GLUCERNA SHAKE)  237 mL Oral BID BM   ferrous sulfate  325 mg Oral Q breakfast   gabapentin  100 mg Oral TID   hydrocortisone sod succinate (SOLU-CORTEF) inj  100 mg Intravenous Q12H   insulin aspart  0-15 Units Subcutaneous TID WC   insulin aspart  0-5 Units  Subcutaneous QHS   insulin glargine-yfgn  16 Units Subcutaneous QHS   mouth rinse  15 mL Mouth Rinse q12n4p   melatonin  10 mg Oral QHS   multivitamin with minerals  1 tablet Oral Daily   mupirocin ointment   Nasal BID   nystatin  5 mL Oral QID   pantoprazole  40 mg Oral Daily   silver nitrate applicators  1 application Topical Once   sodium chloride flush  3 mL Intravenous Q12H   Continuous Infusions:  vancomycin 1,000 mg (03/05/21 1208)     LOS: 6  days   Geradine Girt  [facility]   How to contact the [facility] Attending or Consulting provider [place] or covering provider during after hours [place], for this patient?  Check the care team in [facility] and look for a) attending/consulting [facility] provider listed and b) the [facility] team listed Log into [email or web address] and use [place]'s universal password to access. If you do not have the password, please contact the hospital operator. Locate the [facility] provider you are looking for under [facility] and page to a number that you can be directly reached. If you still have difficulty reaching the provider, please page the [facility] (Director on Call) for the Hospitalists listed on amion for assistance.  03/06/2021, 1:15 PM

## 2021-03-06 NOTE — Progress Notes (Signed)
   Palliative Medicine Inpatient Follow Up Note  Consulting Provider: Karmen Bongo, MD   Reason for consult:   [place] Palliative Medicine Consult  Reason for Consult? goals of care    HPI:  Per intake H&P --> [facility]VI IMES is a 60 y.o. female with medical history significant of HTN; OSA not on CPAP; prediabetes; neurosarcoidosis; colostomy; and seizures presenting with SOB.  She has been having trouble breathing since last night and complaining of chest pain.  Symptoms started yesterday AM and progressed all day and night.  Appears to be epigastric when she points to it.  +cough, nonproductive.  She does throat clearing after drinking water.  No change in LE edema. No fever.  No sick contacts.  The pain is currently resolved.  She has a sacral pressure ulcer that her husband treats.   Palliative care has been asked to get involved to further address goals of care.  Today's Discussion (03/06/2021):  Chart reviewed. Patient denies pain or distress. I called patient's husband [name] to follow up on our conversation and provide support. He confirms that he has spoken with [facility] hospice and anticipates patient's return home on Friday via ambulance. He denies additional needs at this time.   Discussed my recommendation to add PRN Roxanol for symptom management and clarified that IV medications are not administered in the home. Patient's husband is agreeable and verbalizes his appreciation.  Questions and concerns addressed /palliative support was provided.  Objective Assessment: Vital Signs Vitals:   03/05/21 1403 03/06/21 0534  BP: (!) 133/93 127/80  Pulse: (!) 101 88  Resp: 16 19  Temp: 98.3 F (36.8 C) 98.5 F (36.9 C)  SpO2: 100% 92%    Intake/Output Summary (Last 24 hours) at 03/06/2021 1019 Last data filed at 03/05/2021 1522 Gross per 24 hour  Intake 902.37 ml  Output 650 ml  Net 252.37 ml    Last Weight  Most recent update: 02/27/2021  11:54 PM    Weight  80.7 kg (178 lb)            Gen: Very ill-appearing older African-American female CV: Regular rate and rhythm PULM: 2LPM [place] Skin: warm and dry Neuro: Alert, lethargic  SUMMARY OF RECOMMENDATIONS   -Continue current care until patient discharges home with hospice on Friday -Add Roxanol 5mg  Q2H PRN for pain/dyspnea/air hunger -The palliative medicine team remains available for acute needs and support   Time Spent:  15 minutes Greater than 50% of the time was spent in counseling and coordination of care  [facility] Team Team Cell Phone: [phone] Please utilize secure chat with additional questions, if there is no response within 30 minutes please call the above phone number  Palliative Medicine Team providers are available by phone from 7am to 7pm daily and can be reached through the team cell phone.  Should this patient require assistance outside of these hours, please call the patient's attending physician.

## 2021-03-06 NOTE — Plan of Care (Signed)

## 2021-03-06 NOTE — Progress Notes (Addendum)
[place]) Hospital Liaison Note  Received referral from Transitions of Care Manager Marya Amsler [place]) for hospice services at home after discharge. Chart and patient information under review by [facility] physician. Hospice eligibility confirmed.   Spoke with Renita Papa to initiate education related to hospice philosophy, services and team approach to care. Patient and family verbalized understanding of information given. Per discussion, the plan is for discharge home by [facility] on date of discharge.   DME needs discussed. Patient has the following equipment in the home: Wheelchair, walker, bedside commode, and 360 bed.   Family requests no DME at this time. Address has been verified and is correct in the chart. Autumm Hattery [phone] is the family contact.   [facility] information and contact numbers given to [facility]. Above information share with Rockwell [place], Transitions of Care Manager. Please call with any questions or concerns.   Thank you for the opportunity to participate in this patient's care.   Roselee Nova [facility] Liaison [phone]

## 2021-03-07 ENCOUNTER — Other Ambulatory Visit (HOSPITAL_COMMUNITY): Payer: Self-pay

## 2021-03-07 LAB — BASIC METABOLIC PANEL
Anion gap: 8 (ref 5–15)
BUN: 11 mg/dL (ref 6–20)
CO2: 26 mmol/L (ref 22–32)
Calcium: 7 mg/dL — ABNORMAL LOW (ref 8.9–10.3)
Chloride: 96 mmol/L — ABNORMAL LOW (ref 98–111)
Creatinine, Ser: 0.61 mg/dL (ref 0.44–1.00)
GFR, Estimated: >60 mL/min (ref >60)
Glucose, Bld: 202 mg/dL — ABNORMAL HIGH (ref 70–99)
Potassium: 4 mmol/L (ref 3.5–5.1)
Sodium: 130 mmol/L — ABNORMAL LOW (ref 135–145)

## 2021-03-07 LAB — MAGNESIUM: Magnesium: 2.5 mg/dL — ABNORMAL HIGH (ref 1.7–2.4)

## 2021-03-07 LAB — VANCOMYCIN, TROUGH: Vancomycin Tr: 33 ug/mL (ref 15–20)

## 2021-03-07 LAB — GLUCOSE, CAPILLARY
Glucose-Capillary: 184 mg/dL — ABNORMAL HIGH (ref 70–99)
Glucose-Capillary: 184 mg/dL — ABNORMAL HIGH (ref 70–99)
Glucose-Capillary: 184 mg/dL — ABNORMAL HIGH (ref 70–99)
Glucose-Capillary: 189 mg/dL — ABNORMAL HIGH (ref 70–99)
Glucose-Capillary: 167 mg/dL — ABNORMAL HIGH (ref 70–99)
Glucose-Capillary: 140 mg/dL — ABNORMAL HIGH (ref 70–99)

## 2021-03-07 LAB — VITAMIN A: Vitamin A (Retinoic Acid): 6.1 ug/dL — ABNORMAL LOW (ref 22.0–69.5)

## 2021-03-07 MED ORDER — COLLAGENASE 250 UNIT/GM EX OINT
TOPICAL_OINTMENT | Freq: Two times a day (BID) | CUTANEOUS | 0 refills | Status: AC
  Filled 2021-03-07: qty 30, 30d supply, fill #0

## 2021-03-07 MED ORDER — PREDNISONE 20 MG PO TABS
50.0000 mg | ORAL_TABLET | Freq: Every day | ORAL | Status: DC
  Filled 2021-03-07: qty 2

## 2021-03-07 MED ORDER — DOXYCYCLINE HYCLATE 100 MG PO TABS
100.0000 mg | ORAL_TABLET | Freq: Two times a day (BID) | ORAL | 1 refills | Status: AC
  Filled 2021-03-07: qty 60, 30d supply, fill #0

## 2021-03-07 MED ORDER — MORPHINE SULFATE (CONCENTRATE) 20 MG/ML PO SOLN
5.0000 mg | ORAL | 0 refills | Status: AC | PRN
  Filled 2021-03-07: qty 30, 7d supply, fill #0

## 2021-03-07 MED ORDER — INSULIN GLARGINE 100 UNIT/ML ~~LOC~~ SOLN: 16.0000 [IU] | Freq: Every day | SUBCUTANEOUS | 11 refills | Status: AC

## 2021-03-07 MED ORDER — PREDNISONE 20 MG PO TABS
40.0000 mg | ORAL_TABLET | Freq: Two times a day (BID) | ORAL | Status: DC
  Administered 2021-03-07: 40 mg via ORAL
  Filled 2021-03-07 (×2): qty 2

## 2021-03-07 MED ORDER — MORPHINE SULFATE 10 MG/5ML PO SOLN
5.0000 mg | ORAL | 0 refills | Status: DC | PRN
  Filled 2021-03-07: qty 15, 1d supply, fill #0

## 2021-03-07 MED ORDER — DOXYCYCLINE HYCLATE 100 MG PO TABS
100.0000 mg | ORAL_TABLET | Freq: Two times a day (BID) | ORAL | Status: DC
  Administered 2021-03-07 (×2): 100 mg via ORAL
  Filled 2021-03-07 (×2): qty 1

## 2021-03-07 MED ORDER — VANCOMYCIN VARIABLE DOSE PER UNSTABLE RENAL FUNCTION (PHARMACIST DOSING): Status: DC

## 2021-03-07 MED ORDER — POLYETHYLENE GLYCOL 3350 17 G PO PACK
17.0000 g | PACK | Freq: Every day | ORAL | Status: DC
  Administered 2021-03-07: 17 g via ORAL
  Filled 2021-03-07 (×2): qty 1

## 2021-03-07 MED ORDER — ENSURE ENLIVE PO LIQD
237.0000 mL | Freq: Two times a day (BID) | ORAL | Status: DC
  Filled 2021-03-07: qty 237

## 2021-03-07 NOTE — Plan of Care (Signed)

## 2021-03-07 NOTE — Progress Notes (Signed)
Progress Note    Rhonda [name]  ZOX:[id number] DOB: 1960/12/12  DOA: 02/27/2021 PCP: [facility].    Brief Narrative:    Medical records reviewed and are as summarized below:  Rhonda [name] is an 60 y.o. female with medical history of hypertension, OSA not on CPAP, prediabetes, neurosarcoidosis, colostomy, seizures presents with shortness of breath. She was hospitalized in 5/22 with perforated sigmoid colon leading to emergency exploratory laparotomy with Hartman's procedure, she has delayed wound healing with wound VAC placement abdominal wound is slowly improving over time. Patient has history of neurosarcoid, debilitated, bedbound, has large sacral wound.  Plan to d/c home with hospice.   Assessment/Plan:   Principal Problem:   Shortness of breath Active Problems:   Essential hypertension   OSA (obstructive sleep apnea)   HLD (hyperlipidemia)   Neurosarcoidosis   Type 2 diabetes mellitus without complication, with long-term current use of insulin (HCC)   Unstageable pressure ulcer of sacral region ([place])   Hypokalemia   DNR (do not resuscitate)   Malnutrition of moderate degree   Chest wall abscess   MRSA bacteremia   Acute respiratory failure with hypoxia (HCC)   Goals of care, counseling/discussion   Thrombocytopenia (HCC)    Severe sepsis/MRSA bacteremia -Was seen by general surgery and underwent debridement of stage IV sacral pressure ulcer -Sepsis work-up was initiated, lactic acid initially was 1.6, repeat lactic acid was 6.0 -Blood and urine cultures were obtained.  MRSA growing in all 4 bottles from blood cultures -Patient was empirically started on vancomycin and Zosyn on 03/01/2021 -Antibiotics have been changed to vancomycin and Unasyn per pharmacy -Source of infection likely decubitus ulcer versus questionable chest wall abscess, sternoclavicular joint septic arthritis -She underwent drainage of the abscess - Culture growing  gram-positive cocci in clusters -discussed with ID, plan to change to doxy for home   Chest wall abscess -CTA chest showed 7.5 cm anterior chest wall abscess of uncertain origin -Questionable sternoclavicular joint septic arthritis -MRI chest without contrast ordered by ID -Initially radiology felt that they could not do MRI chest because of the implanted abdominal coils -However they were able to do MRI chest which confirmed septic arthritis of right sternoclavicular joint -CT surgery was consulted, they do not feel that patient is stable enough to go for surgical intervention -CT surgery recommended CT-guided drain placement and drainage of the abscess -Spoke to the IR this morning, as patient has low platelet count of 6000.  They recommend giving 2 units of platelets before putting a drain -ENT also consulted, does not feel any need for surgical intervention for extension of the lower neck abscess, which is likely coming from the septic arthritis of right sternoclavicular joint -Patient underwent drainage of the abscess per IR on 10/17, culture growing staph a.   Thrombocytopenia -Patient has history of chronic thrombocytopenia -She presented with platelet count of 56,000 -Since starting antibiotics platelet count has been steadily going down  per oncology: For now, I recommend platelet transfusion if platelet count is less than 10,000 or active bleeding   Hypokalemia -Repleted along with Mg   Dysphagia -Swallow evaluation obtained - DYS diet   Altered mental status -per family seems to be at baseline   Sacral pressure ulcer stage IV -General surgery did debridement  -CT pelvis showed sacral decubitus ulcer with regional cellulitis and soft tissue emphysema -No osteomyelitis or drainable fluid collection -PT changing Hydrotherapy to 3 x week.  Bedside RN to perform dressing changes  on Sunday, Tues, Thur and Sat. WOC will coordinate with PT on Friday and re-assess the wound.    Diabetes mellitus type 2 -Recent hemoglobin A1c was 7.8 -Continue Lantus, moderate sliding scale insulin with NovoLog   History of pulmonary embolism -Eliquis was held yesterday due to thrombocytopenia -transitioned to comfort care -held due to low plts  Neurosarcoidosis -Patient is nonambulatory -wean to PO steroids (40 mg BID at home)   Obesity Estimated body mass index is 31.53 kg/m as calculated from the following:   Height as of this encounter: 5\' 3"  (1.6 m).   Weight as of this encounter: 80.7 kg.   Obstructive sleep apnea -Patient not on CPAP   Goals of care Discussed with patient's husband at bedside, patient is very sick considering MRSA bacteremia/severe sepsis, altered mental status.  She is DNR/DNI, not requiring pressors at this time.  She has been started on vancomycin and Zosyn as above.  Patient family wants to take her home with hospice plan for Friday -will place foley   Family Communication/Anticipated D/C date and plan/Code Status   DVT prophylaxis: scd Code Status: DNR Family Communication:husband at bedside Disposition Plan: Status is: Inpatient           Medical Consultants:   ID GS Palliative care  Subjective:   Husband feeding patient   Objective:    Vitals:   03/06/21 1217 03/06/21 2156 03/07/21 0405 03/07/21 1207  BP: (!) 144/112 (!) 139/102 135/89 (!) 164/107  Pulse: (!) 108 65 75 (!) 102  Resp: 18 19 18    Temp: 97.7 F (36.5 C) 98.4 F (36.9 C) 98.2 F (36.8 C) 97.9 F (36.6 C)  TempSrc: Oral Oral Oral Oral  SpO2:  93%  92%  Weight:      Height:        Intake/Output Summary (Last 24 hours) at 03/07/2021 1331 Last data filed at 03/06/2021 1829 Gross per 24 hour  Intake 1286.28 ml  Output 700 ml  Net 586.28 ml   Filed Weights   02/27/21 2353  Weight: 80.7 kg    Exam:   General: Appearance:    Obese female in no acute distress     Lungs:     respirations unlabored  Heart:    Tachycardic.   MS:   All  extremities are intact.    Neurologic:   Awake       Data Reviewed:   I have personally reviewed following labs and imaging studies:  Labs: Labs show the following:   Basic Metabolic Panel: Recent Labs  Lab 03/02/21 0311 03/04/21 0415 03/05/21 0306 03/06/21 0317 03/07/21 0306  NA 129* 137 142 137 130*  K 4.8 2.4* 3.7 2.6* 4.0  CL 94* 101 104 100 96*  CO2 19* 28 27 25 26   GLUCOSE 98 113* 186* 311* 202*  BUN 17 13 11 9 11   CREATININE 0.78 0.70 0.65 0.68 0.61  CALCIUM 8.6* 7.7* 7.2* 7.0* 7.0*  MG  --  1.6*  --   --  2.5*   GFR Estimated Creatinine Clearance: 75.2 mL/min (by C-G formula based on SCr of 0.61 mg/dL). Liver Function Tests: Recent Labs  Lab 03/05/21 0306 03/06/21 0317  AST 52* 48*  ALT 60* 56*  ALKPHOS 489* 631*  BILITOT 1.9* 2.3*  PROT 4.1* 4.2*  ALBUMIN <1.5* <1.5*   No results for input(s): LIPASE, AMYLASE in the last 168 hours. No results for input(s): AMMONIA in the last 168 hours. Coagulation profile Recent Labs  Lab 03/04/21  0415  INR 1.2    CBC: Recent Labs  Lab 03/01/21 0504 03/02/21 0411 03/04/21 0415 03/05/21 0306 03/06/21 0317  WBC 13.5* 14.2* 6.7 7.7 8.1  NEUTROABS  --   --  5.8  --   --   HGB 12.2 11.3* 9.3* 9.5* 9.3*  HCT 39.6 36.6 29.4* 29.0* 29.5*  MCV 90.2 90.8 89.1 86.8 88.9  PLT 40* 30* 6* 8* 12*   Cardiac Enzymes: No results for input(s): CKTOTAL, CKMB, CKMBINDEX, TROPONINI in the last 168 hours. BNP (last 3 results) No results for input(s): PROBNP in the last 8760 hours. CBG: Recent Labs  Lab 03/06/21 1216 03/06/21 2153 03/07/21 0751 03/07/21 1029 03/07/21 1159  GLUCAP 208* 198* 184* 184* 184*   D-Dimer: No results for input(s): DDIMER in the last 72 hours. Hgb A1c: No results for input(s): HGBA1C in the last 72 hours. Lipid Profile: No results for input(s): CHOL, HDL, LDLCALC, TRIG, CHOLHDL, LDLDIRECT in the last 72 hours. Thyroid function studies: No results for input(s): TSH, T4TOTAL, T3FREE,  THYROIDAB in the last 72 hours.  Invalid input(s): FREET3 Anemia work up: No results for input(s): VITAMINB12, FOLATE, FERRITIN, TIBC, IRON, RETICCTPCT in the last 72 hours. Sepsis Labs: Recent Labs  Lab 03/01/21 0945 03/01/21 1221 03/02/21 0411 03/04/21 0415 03/05/21 0306 03/06/21 0317  WBC  --   --  14.2* 6.7 7.7 8.1  LATICACIDVEN 1.6 6.0*  --   --   --   --     Microbiology Recent Results (from the past 240 hour(s))  Resp Panel by RT-PCR (Flu A&B, Covid) Nasopharyngeal Swab     Status: None   Collection Time: 02/28/21  1:55 AM   Specimen: Nasopharyngeal Swab; Nasopharyngeal(NP) swabs in vial transport medium  Result Value Ref Range Status   SARS Coronavirus 2 by RT PCR NEGATIVE NEGATIVE Final    Comment: (NOTE) SARS-CoV-2 target nucleic acids are NOT DETECTED.  The SARS-CoV-2 RNA is generally detectable in upper respiratory specimens during the acute phase of infection. The lowest concentration of SARS-CoV-2 viral copies this assay can detect is 138 copies/mL. A negative result does not preclude SARS-Cov-2 infection and should not be used as the sole basis for treatment or other patient management decisions. A negative result may occur with  improper specimen collection/handling, submission of specimen other than nasopharyngeal swab, presence of viral mutation(s) within the areas targeted by this assay, and inadequate number of viral copies(<138 copies/mL). A negative result must be combined with clinical observations, patient history, and epidemiological information. The expected result is Negative.  Fact Sheet for Patients:  EntrepreneurPulse.com.au  Fact Sheet for Healthcare Providers:  IncredibleEmployment.be  This test is no t yet approved or cleared by the [place] FDA and  has been authorized for detection and/or diagnosis of SARS-CoV-2 by FDA under an Emergency Use Authorization (EUA). This EUA will remain  in effect  (meaning this test can be used) for the duration of the COVID-19 declaration under Section 564(b)(1) of the Act, 21 U.S.C.section 360bbb-3(b)(1), unless the authorization is terminated  or revoked sooner.       Influenza A by PCR NEGATIVE NEGATIVE Final   Influenza B by PCR NEGATIVE NEGATIVE Final    Comment: (NOTE) The Xpert Xpress SARS-CoV-2/FLU/RSV plus assay is intended as an aid in the diagnosis of influenza from Nasopharyngeal swab specimens and should not be used as a sole basis for treatment. Nasal washings and aspirates are unacceptable for Xpert Xpress SARS-CoV-2/FLU/RSV testing.  Fact Sheet for Patients: EntrepreneurPulse.com.au  Fact Sheet for Healthcare Providers: IncredibleEmployment.be  This test is not yet approved or cleared by the [place] FDA and has been authorized for detection and/or diagnosis of SARS-CoV-2 by FDA under an Emergency Use Authorization (EUA). This EUA will remain in effect (meaning this test can be used) for the duration of the COVID-19 declaration under Section 564(b)(1) of the Act, 21 U.S.C. section 360bbb-3(b)(1), unless the authorization is terminated or revoked.  Performed at [facility], [place] [address] [place], [place] [address]   Culture, blood (routine x 2)     Status: Abnormal   Collection Time: 03/01/21  9:45 AM   Specimen: BLOOD  Result Value Ref Range Status   Specimen Description BLOOD RIGHT ANTECUBITAL  Final   Special Requests   Final    BOTTLES DRAWN AEROBIC ONLY Blood Culture adequate volume   Culture  Setup Time   Final    GRAM POSITIVE COCCI IN CLUSTERS AEROBIC BOTTLE ONLY CRITICAL VALUE NOTED.  VALUE IS CONSISTENT WITH PREVIOUSLY REPORTED AND CALLED VALUE.    Culture (A)  Final    STAPHYLOCOCCUS AUREUS SUSCEPTIBILITIES PERFORMED ON PREVIOUS CULTURE WITHIN THE LAST 5 DAYS. Performed at [facility], [place] [address], [place], [place] [address]    Report Status  03/04/2021 FINAL  Final  Culture, blood (routine x 2)     Status: Abnormal   Collection Time: 03/01/21  9:59 AM   Specimen: BLOOD  Result Value Ref Range Status   Specimen Description BLOOD LEFT ANTECUBITAL  Final   Special Requests   Final    BOTTLES DRAWN AEROBIC AND ANAEROBIC Blood Culture adequate volume   Culture  Setup Time   Final    GRAM POSITIVE COCCI IN CLUSTERS IN BOTH AEROBIC AND ANAEROBIC BOTTLES CRITICAL RESULT CALLED TO, READ BACK BY AND VERIFIED WITH: V BRYK,PHARMD@0010  03/02/21 [place] Performed at [facility], [place] [address] [place], [place] [address]    Culture METHICILLIN RESISTANT STAPHYLOCOCCUS AUREUS (A)  Final   Report Status 03/04/2021 FINAL  Final   Organism ID, Bacteria METHICILLIN RESISTANT STAPHYLOCOCCUS AUREUS  Final      Susceptibility   Methicillin resistant staphylococcus aureus - MIC*    CIPROFLOXACIN <=0.5 SENSITIVE Sensitive     ERYTHROMYCIN >=8 RESISTANT Resistant     GENTAMICIN <=0.5 SENSITIVE Sensitive     OXACILLIN >=4 RESISTANT Resistant     TETRACYCLINE <=1 SENSITIVE Sensitive     VANCOMYCIN 1 SENSITIVE Sensitive     TRIMETH/SULFA <=10 SENSITIVE Sensitive     CLINDAMYCIN <=0.25 SENSITIVE Sensitive     RIFAMPIN <=0.5 SENSITIVE Sensitive     Inducible Clindamycin NEGATIVE Sensitive     * METHICILLIN RESISTANT STAPHYLOCOCCUS AUREUS  Blood Culture ID Panel (Reflexed)     Status: Abnormal   Collection Time: 03/01/21  9:59 AM  Result Value Ref Range Status   Enterococcus faecalis NOT DETECTED NOT DETECTED Final   Enterococcus Faecium NOT DETECTED NOT DETECTED Final   Listeria monocytogenes NOT DETECTED NOT DETECTED Final   Staphylococcus species DETECTED (A) NOT DETECTED Final    Comment: CRITICAL RESULT CALLED TO, READ BACK BY AND VERIFIED WITH: V BRYK,PHARMD@0010  03/02/21 [place]    Staphylococcus aureus (BCID) DETECTED (A) NOT DETECTED Final    Comment: Methicillin (oxacillin)-resistant Staphylococcus aureus (MRSA). MRSA is predictably  resistant to beta-lactam antibiotics (except ceftaroline). Preferred therapy is vancomycin unless clinically contraindicated. Patient requires contact precautions if  hospitalized. CRITICAL RESULT CALLED TO, READ BACK BY AND VERIFIED WITH: V BRYK,PHARMD@0010   03/02/21 [place]    Staphylococcus epidermidis NOT DETECTED NOT DETECTED Final   Staphylococcus lugdunensis NOT DETECTED NOT DETECTED Final   Streptococcus species NOT DETECTED NOT DETECTED Final   Streptococcus agalactiae NOT DETECTED NOT DETECTED Final   Streptococcus pneumoniae NOT DETECTED NOT DETECTED Final   Streptococcus pyogenes NOT DETECTED NOT DETECTED Final   A.calcoaceticus-baumannii NOT DETECTED NOT DETECTED Final   Bacteroides fragilis NOT DETECTED NOT DETECTED Final   Enterobacterales NOT DETECTED NOT DETECTED Final   Enterobacter cloacae complex NOT DETECTED NOT DETECTED Final   Escherichia coli NOT DETECTED NOT DETECTED Final   Klebsiella aerogenes NOT DETECTED NOT DETECTED Final   Klebsiella oxytoca NOT DETECTED NOT DETECTED Final   Klebsiella pneumoniae NOT DETECTED NOT DETECTED Final   Proteus species NOT DETECTED NOT DETECTED Final   Salmonella species NOT DETECTED NOT DETECTED Final   Serratia marcescens NOT DETECTED NOT DETECTED Final   Haemophilus influenzae NOT DETECTED NOT DETECTED Final   Neisseria meningitidis NOT DETECTED NOT DETECTED Final   Pseudomonas aeruginosa NOT DETECTED NOT DETECTED Final   Stenotrophomonas maltophilia NOT DETECTED NOT DETECTED Final   Candida albicans NOT DETECTED NOT DETECTED Final   Candida auris NOT DETECTED NOT DETECTED Final   Candida glabrata NOT DETECTED NOT DETECTED Final   Candida krusei NOT DETECTED NOT DETECTED Final   Candida parapsilosis NOT DETECTED NOT DETECTED Final   Candida tropicalis NOT DETECTED NOT DETECTED Final   Cryptococcus neoformans/gattii NOT DETECTED NOT DETECTED Final   Meth resistant mecA/C and MREJ DETECTED (A) NOT DETECTED Final    Comment:  CRITICAL RESULT CALLED TO, READ BACK BY AND VERIFIED WITH: V BRYK,PHARMD@0010  03/02/21 [place] Performed at [facility], [address] [place], [place] [address]   Culture, blood (routine x 2)     Status: Abnormal   Collection Time: 03/03/21  4:33 AM   Specimen: BLOOD RIGHT HAND  Result Value Ref Range Status   Specimen Description BLOOD RIGHT HAND  Final   Special Requests   Final    BOTTLES DRAWN AEROBIC ONLY Blood Culture results may not be optimal due to an inadequate volume of blood received in culture bottles   Culture  Setup Time   Final    GRAM POSITIVE COCCI IN CLUSTERS AEROBIC BOTTLE ONLY CRITICAL VALUE NOTED.  VALUE IS CONSISTENT WITH PREVIOUSLY REPORTED AND CALLED VALUE.    Culture (A)  Final    STAPHYLOCOCCUS AUREUS SUSCEPTIBILITIES PERFORMED ON PREVIOUS CULTURE WITHIN THE LAST 5 DAYS. Performed at [facility], [place] [address] [place], [place] [address]    Report Status 03/06/2021 FINAL  Final  Culture, blood (routine x 2)     Status: Abnormal   Collection Time: 03/03/21  4:58 AM   Specimen: BLOOD LEFT HAND  Result Value Ref Range Status   Specimen Description BLOOD LEFT HAND  Final   Special Requests   Final    BOTTLES DRAWN AEROBIC ONLY Blood Culture results may not be optimal due to an inadequate volume of blood received in culture bottles   Culture  Setup Time   Final    GRAM POSITIVE COCCI IN CLUSTERS AEROBIC BOTTLE ONLY CRITICAL VALUE NOTED.  VALUE IS CONSISTENT WITH PREVIOUSLY REPORTED AND CALLED VALUE.    Culture (A)  Final    STAPHYLOCOCCUS AUREUS SUSCEPTIBILITIES PERFORMED ON PREVIOUS CULTURE WITHIN THE LAST 5 DAYS. Performed at [facility], [place] [address], [place],  [address]    Report Status 03/06/2021 FINAL  Final  MRSA Next Gen by PCR, Nasal     Status: Abnormal   Collection Time: 03/04/21 11:27 AM   Specimen: Nasal Mucosa; Nasal Swab  Result Value Ref Range Status   MRSA by PCR Next Gen DETECTED (A) NOT DETECTED Final     Comment: RESULT CALLED TO, READ BACK BY AND VERIFIED WITH: RN S MIGANTOS [id number] AT 1311 BY CM (NOTE) The GeneXpert MRSA Assay (FDA approved for NASAL specimens only), is one component of a comprehensive MRSA colonization surveillance program. It is not intended to diagnose MRSA infection nor to guide or monitor treatment for MRSA infections. Test performance is not FDA approved in patients less than 62 years old. Performed at [facility], [place] [address], [place], [place] [address]   Aerobic/Anaerobic Culture w Gram Stain (surgical/deep wound)     Status: None (Preliminary result)   Collection Time: 03/04/21  5:20 PM   Specimen: Abscess  Result Value Ref Range Status   Specimen Description ABSCESS RIGHT NECK  Final   Special Requests NONE  Final   Gram Stain   Final    ABUNDANT WBC PRESENT, PREDOMINANTLY MONONUCLEAR MODERATE GRAM POSITIVE COCCI IN PAIRS AND CHAINS FEW GRAM POSITIVE COCCI IN CLUSTERS Performed at [facility], [place] [address] [place], [place] [address]    Culture   Final    ABUNDANT METHICILLIN RESISTANT STAPHYLOCOCCUS AUREUS NO ANAEROBES ISOLATED; CULTURE IN PROGRESS FOR 5 DAYS    Report Status PENDING  Incomplete   Organism ID, Bacteria METHICILLIN RESISTANT STAPHYLOCOCCUS AUREUS  Final      Susceptibility   Methicillin resistant staphylococcus aureus - MIC*    CIPROFLOXACIN <=0.5 SENSITIVE Sensitive     ERYTHROMYCIN >=8 RESISTANT Resistant     GENTAMICIN <=0.5 SENSITIVE Sensitive     OXACILLIN >=4 RESISTANT Resistant     TETRACYCLINE <=1 SENSITIVE Sensitive     VANCOMYCIN <=0.5 SENSITIVE Sensitive     TRIMETH/SULFA <=10 SENSITIVE Sensitive     CLINDAMYCIN <=0.25 SENSITIVE Sensitive     RIFAMPIN <=0.5 SENSITIVE Sensitive     Inducible Clindamycin NEGATIVE Sensitive     * ABUNDANT METHICILLIN RESISTANT STAPHYLOCOCCUS AUREUS    Procedures and diagnostic studies:  No results found.  Medications:    atorvastatin  80 mg Oral q1800    chlorhexidine  15 mL Mouth Rinse BID   Chlorhexidine Gluconate Cloth  6 each Topical Daily   collagenase   Topical BID   doxycycline  100 mg Oral Q12H   feeding supplement (GLUCERNA SHAKE)  237 mL Oral BID BM   ferrous sulfate  325 mg Oral Q breakfast   gabapentin  100 mg Oral TID   insulin aspart  0-15 Units Subcutaneous TID WC   insulin aspart  0-5 Units Subcutaneous QHS   insulin glargine-yfgn  16 Units Subcutaneous QHS   mouth rinse  15 mL Mouth Rinse q12n4p   melatonin  10 mg Oral QHS   multivitamin with minerals  1 tablet Oral Daily   mupirocin ointment   Nasal BID   nystatin  5 mL Oral QID   pantoprazole  40 mg Oral Daily   [START ON 03/02/2021] predniSONE  50 mg Oral Q breakfast   silver nitrate applicators  1 application Topical Once   sodium chloride flush  3 mL Intravenous Q12H   vancomycin variable dose per unstable renal function (pharmacist dosing)   Does not apply See admin instructions   Continuous Infusions:     LOS: 7 days  Geradine Girt  [facility]   How to contact the [facility] Attending or Consulting provider [place] or covering provider during after hours [place], for this patient?  Check the care team in [facility] and look for a) attending/consulting [facility] provider listed and b) the [facility] team listed Log into [email or web address] and use [place]'s universal password to access. If you do not have the password, please contact the hospital operator. Locate the [facility] provider you are looking for under [facility] and page to a number that you can be directly reached. If you still have difficulty reaching the provider, please page the [facility] (Director on Call) for the Hospitalists listed on amion for assistance.  03/07/2021, 1:31 PM

## 2021-03-07 NOTE — Progress Notes (Signed)
Nutrition Follow-up  DOCUMENTATION CODES:   Non-severe (moderate) malnutrition in context of chronic illness  INTERVENTION -d/c Glucerna -Ensure Enlive po BID, each supplement provides 350 kcal and 20 grams of protein -Continue snacks TID -Continue MVI with minerals daily  Please note the following labs came back deficient: vitamin A, vitamin C, copper, selenium. Note pt is supposed to discharge home with hospice tomorrow, so will not recommend treatment of deficiencies at this time. However, if pt's GOC change and pt is to begin receiving full scope again, those labs will need to be addressed by pt's PCP/medical team.   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 Ongoing  GOAL:   Patient will meet greater than or equal to 90% of their needs Not met  MONITOR:   PO intake, Supplement acceptance, Labs, Weight trends, I & O's, Skin  REASON FOR ASSESSMENT:   Consult Wound healing  ASSESSMENT:   Pt with PMH significant for HTN, OSA not on CPAP, pre-DM, neurosarcoidosis, h/o seizures, and recent hospitalization in 09/2020 for perforated sigmoid colon requiring emergent ex lap and Hartman's procedure w/ colostomy presenting with shortness of breath.  10/18 diet downgraded to dysphagia 1 with thin liquids (previously dysphagia 3)  Pt underwent drainage of abscess per IR on 10/17, culture growing staph a. PMT has been following pt. Note current plan is for pt to continue current care until discharges home with hospice tomorrow. SLP has been following but signed off today given pt is to go home with hospice tomorrow. Per SLP, pt too lethargic to participate in evaluation today. SLP recommending D1 diet with thin liquids be continued when pt alert enough to eat. No meal documentation since last RD visit. Suspect intake has not been adequate to meet needs but pt is a poor candidate for  nutrition support, especially considering plan to d/c home with hospice tomorrow. Food should be a source of comfort for pt. Pt with orders for Glucerna BID and has had mixed consumption. Will transition pt to Ensure given poor po and pt prefers Ensure.   UOP: x24 hours I/O: +6448ml since admit Colostomy: 2ml documented x24 hours  Medications: ferrous sulfate, SSI TID w/ meals and at bedtime, 16 units semglee daily, mvi with minerals, protonix, deltasone Labs: Na 130 (L), Mg 2.5 (H) CBGs [phone]  Vitamin/MIneral Profile:  Thiamine B1: 152.7 (WNL) Vitamin B12: >7500 (H) Folate B9: 16.3 (WNL) Vitamin A: 6.1 (L) Vitamin D: 53.05 (WNL) Vitamin C: 0.3 (L) Copper: 68 (L) Selenium: 75 (L) Zinc: 54 (WNL)  Diet Order:   Diet Order             DIET - DYS 1 Room service appropriate? Yes with Assist; Fluid consistency: Thin  Diet effective now                   EDUCATION NEEDS:   No education needs have been identified at this time  Skin:  Skin Assessment: Skin Integrity Issues: Skin Integrity Issues:: Stage IV, Other (Comment) Stage IV: sacrum Other: abdominal wounds x2  Last BM:  10/20  Height:   Ht Readings from Last 1 Encounters:  02/27/21 5\' 3"  (1.6 m)    Weight:   Wt Readings from Last 10 Encounters:  02/27/21 80.7 kg  02/16/21 81.4 kg  11/25/20 81.4 kg  09/28/20 92.2 kg  08/06/20 79.4 kg  07/24/20 82 kg  04/29/20 77.1 kg  01/12/20 79.2 kg  01/07/20 82 kg  11/26/19 76.7 kg    BMI:  Body mass index is 31.53 kg/m.  Estimated Nutritional Needs:   Kcal:  1850-2050  Protein:  120-135 grams  Fluid:  >1.85L (monitor UOP and ostomy output)    Larkin Ina, MS, RD, LDN (she/her/hers) RD pager number and weekend/on-call pager number located in [place].

## 2021-03-07 NOTE — Consult Note (Signed)
[facility] Nurse wound follow up Patient discharging to Hospice. Hydrotherapy has been discontinued. Continue dressing changes in orders until discharge.   [name], MSN, RN, [place], [name], [facility] Wound Treatment Associate Pager [phone]

## 2021-03-07 NOTE — Progress Notes (Signed)
[facility]) Hospital Liaison Note  Spoke with husband, [name], by phone to confirm plan for discharge tomorrow morning. [name] states that he and family are ready for patient to come home tomorrow.   [facility]AR has been arranged for a 10 am pickup.  Please provide prescriptions at discharge as needed to ensure ongoing symptom management.  Please send signed and completed DNR home with the patient.  Please call with any hospice related questions or concerns.  Thank you. Margaretmary Eddy, BSN, RN [facility] Liaison [phone]

## 2021-03-07 NOTE — Progress Notes (Signed)
Pharmacy Antibiotic Note  Rhonda [name] is a 60 y.o. female admitted on 02/27/2021 with pressure ulcer.  Pt was found to have sepsis and MRSA bacteremia.  Pt has been on Vancomycin since 10/14.  Levels originally ordered for 10/19 and 10/20 to evaluate dosing, however noted plan is now for home with hospice and no IV antibiotics on discharge.  Based on Vancomycin trough level today 33 (goal 15-20) patient will have sufficient drug in her system to last for >48 hours.  Will discontinue further Vancomycin doses with plan to discharge patient 10/21.  I have discussed this information with Dr. Eliseo Squires who is in agreement with plan.  Plan: Discontinue Vancomycin.  Recent Labs  Lab 03/01/21 0504 03/01/21 0945 03/01/21 1221 03/02/21 0311 03/02/21 0411 03/04/21 0415 03/05/21 0306 03/06/21 0317 03/06/21 1454 03/07/21 0306 03/07/21 1111  WBC 13.5*  --   --   --  14.2* 6.7 7.7 8.1  --   --   --   CREATININE 0.52  --   --  0.78  --  0.70 0.65 0.68  --  0.61  --   LATICACIDVEN  --  1.6 6.0*  --   --   --   --   --   --   --   --   VANCOTROUGH  --   --   --   --   --   --   --   --   --   --  33*  VANCOPEAK  --   --   --   --   --   --   --   --  30  --   --     Estimated Creatinine Clearance: 75.2 mL/min (by C-G formula based on SCr of 0.61 mg/dL).    Thank you for allowing pharmacy to be a part of this patient's care.  [employer], Pharm.D., BCPS Clinical Pharmacist  **Pharmacist phone directory can be found on [email or web address] listed under [place].  03/07/2021 1:00 PM

## 2021-03-07 NOTE — Progress Notes (Signed)
SLP Cancellation Note  Patient Details Name: Rhonda [name] MRN: [id number] DOB: 1960-10-27   Cancelled treatment:       Reason Eval/Treat Not Completed: Patient's level of consciousness. Pt remains lethargic. Will d/c home with hospice. Current puree/thin liquid diet when alert remains appropriate through d/c. Will sign off   Emilee Market, Katherene Ponto 03/07/2021, 10:15 AM

## 2021-03-07 NOTE — Progress Notes (Signed)
Date and time results received: 03/07/21 1230 (use smartphrase ".now" to insert current time)  Test: Vancomycin Trough Critical Value: 42  Name of Provider Notified: Notified Kimberley Hammons (Pharmacist) at 1250 and Dr Eliseo Squires at 1300  Orders Received? Or Actions Taken?: The pharmacist to see whether the patient was going to continue the medication or how she will dose the med. The Pharmacist plan was to notify MD concerning the medication. [facility] [place] RN.

## 2021-03-08 ENCOUNTER — Other Ambulatory Visit (HOSPITAL_COMMUNITY): Payer: Self-pay

## 2021-03-08 LAB — GLUCOSE, CAPILLARY
Glucose-Capillary: 46 mg/dL — ABNORMAL LOW (ref 70–99)
Glucose-Capillary: 98 mg/dL (ref 70–99)
Glucose-Capillary: 68 mg/dL — ABNORMAL LOW (ref 70–99)
Glucose-Capillary: 71 mg/dL (ref 70–99)
Glucose-Capillary: 61 mg/dL — ABNORMAL LOW (ref 70–99)

## 2021-03-08 MED ORDER — DEXTROSE 50 % IV SOLN
INTRAVENOUS | Status: AC
  Administered 2021-03-08: 50 mL
  Filled 2021-03-08: qty 50

## 2021-03-08 NOTE — Discharge Summary (Signed)
Physician Discharge Summary  Rhonda [name] RXY:[id number] DOB: September 10, 1960 DOA: 02/27/2021  PCP: [place] date: 02/27/2021 Discharge date: 02/20/2021  Admitted From: home Discharge disposition: home with hospice   Recommendations for Outpatient Follow-Up:   D/c with hospice: suspect patient may quickly decline and will need additional support by hospice D/c'd with foley for comfort care   Discharge Diagnosis:   Principal Problem:   Shortness of breath Active Problems:   Essential hypertension   OSA (obstructive sleep apnea)   HLD (hyperlipidemia)   Neurosarcoidosis   Type 2 diabetes mellitus without complication, with long-term current use of insulin (HCC)   Unstageable pressure ulcer of sacral region ([place])   Hypokalemia   DNR (do not resuscitate)   Malnutrition of moderate degree   Chest wall abscess   MRSA bacteremia   Acute respiratory failure with hypoxia ([place])   Goals of care, counseling/discussion   Thrombocytopenia ([place])    Discharge Condition: terminal  Diet recommendation: comfort feeds  Wound care: see below  Code status: DNR   History of Present Illness:   Rhonda [name] is a 60 y.o. female with medical history significant of HTN; OSA not on CPAP; prediabetes; neurosarcoidosis; colostomy; and seizures presenting with SOB.  She has been having trouble breathing since last night and complaining of chest pain.  Symptoms started yesterday AM and progressed all day and night.  Appears to be epigastric when she points to it.  +cough, nonproductive.  She does throat clearing after drinking water.  No change in LE edema. No fever.  No sick contacts.  The pain is currently resolved.  She has a sacral pressure ulcer that her husband treats. -plan to transition home with hospice   Hospital Course by Problem:   Severe sepsis/MRSA bacteremia -Was seen by general surgery and underwent debridement of stage IV sacral pressure  ulcer -Sepsis work-up was initiated, lactic acid initially was 1.6, repeat lactic acid was 6.0 -Blood and urine cultures were obtained.  MRSA growing in all 4 bottles from blood cultures --Source of infection likely decubitus ulcer versus questionable chest wall abscess, sternoclavicular joint septic arthritis -She underwent drainage of the abscess - Culture growing gram-positive cocci in clusters -discussed with ID, plan to change to doxy for home   Chest wall abscess --Patient underwent drainage of the abscess per IR on 10/17, culture growing staph a. Comfort focused care -discussed with ID- if patient able to take PO can use doxy   Thrombocytopenia -Patient has history of chronic thrombocytopenia -comfort care  Hypokalemia -Repleted    Dysphagia -Swallow evaluation obtained - DYS diet- comfort feeds   Altered mental status -per family seems to be at baseline   Sacral pressure ulcer stage IV -General surgery did debridement  -CT pelvis showed sacral decubitus ulcer with regional cellulitis and soft tissue emphysema -No osteomyelitis or drainable fluid collection -family to provide wound care   Diabetes mellitus type 2 -comfort focused care   History of pulmonary embolism -transitioned to comfort care   Neurosarcoidosis -Patient is nonambulatory -wean to PO steroids (40 mg BID at home)   Obesity Estimated body mass index is 31.53 kg/m as calculated from the following:   Height as of this encounter: $RemoveBeforeD'5\' 3"'eEgJlHxrCBKOqk$  (1.6 m).   Weight as of this encounter: 80.7 kg.    Obstructive sleep apnea -Patient not on CPAP   Goals of care Comfort focused care- being d/c'd with hospice -will place foley  Medical Consultants:   ID GS Palliative care   Discharge Exam:   Vitals:   02/18/2021 0607 02/28/2021 1300  BP: (!) 120/108 125/83  Pulse: 81 72  Resp: 17 15  Temp: 98 F (36.7 C)   SpO2: 90% 97%   Vitals:   03/07/21 1207 03/07/21 2149 03/05/2021 0607 03/13/2021 1300   BP: (!) 164/107 (!) 129/94 (!) 120/108 125/83  Pulse: (!) 102 99 81 72  Resp:  $Remo'19 17 15  'zXLpR$ Temp: 97.9 F (36.6 C) 98.2 F (36.8 C) 98 F (36.7 C)   TempSrc: Oral Oral Axillary   SpO2: 92% 93% 90% 97%  Weight:      Height:        General exam: Appears calm and comfortable. Husband not at bedside   The results of significant diagnostics from this hospitalization (including imaging, microbiology, ancillary and laboratory) are listed below for reference.     Procedures and Diagnostic Studies:   CT Angio Chest PE W and/or Wo Contrast  Result Date: 02/28/2021 CLINICAL DATA:  Shortness of breath. EXAM: CT ANGIOGRAPHY CHEST WITH CONTRAST TECHNIQUE: Multidetector CT imaging of the chest was performed using the standard protocol during bolus administration of intravenous contrast. Multiplanar CT image reconstructions and MIPs were obtained to evaluate the vascular anatomy. CONTRAST:  76mL OMNIPAQUE IOHEXOL 350 MG/ML SOLN COMPARISON:  CT scan 09/21/2020 FINDINGS: Cardiovascular: The heart is mildly enlarged but stable. No pericardial effusion. Stable tortuosity and mild ectasia of the thoracic aorta but no aneurysm or dissection. Minimal atherosclerotic calcification at the aortic arch. The branch vessels are patent. No definite coronary artery calcifications. The pulmonary arterial tree is well opacified. Small bandlike filling defect noted in the proximal right upper lobe pulmonary artery in an area of a prior pulmonary embolus consistent with a fibrotic band. No findings suspicious for acute PE. Mediastinum/Nodes: There is a stable fluid collection noted near the left atrium and inferior pulmonary vein on the right side. This may be some type of epicardial cyst. No mediastinal or hilar adenopathy. The esophagus is grossly. Lungs/Pleura: Chronic appearing patchy mosaic pattern of ground-glass attenuation could be asymmetric pulmonary edema, small airways disease such as asthma or respiratory  bronchiolitis. Other possibilities would include cryptogenic organizing pneumonia or hypersensitivity pneumonitis. There are also small bilateral pleural effusions and overlying atelectasis. No worrisome pulmonary lesions. Upper Abdomen: No significant upper abdominal findings. Musculoskeletal: No breast masses are identified. There is a complex appearing fluid collection containing gas involving the right chest wall anteriorly. The upper part of this abscess is anterior to the thyroid gland and it appears it may communicate with a second lower abscess near the right sternoclavicular joint and anterior to the right clavicular head. Do not see any definite CT findings for septic arthritis at the right [place] joint that would be a possibility. This measures a maximum of 7.5 cm. Aspiration may be helpful for diagnostic and therapeutic purposes. MRI examination of the chest wall may be helpful for further evaluation. Review of the MIP images confirms the above findings. IMPRESSION: 1. Small bandlike filling defect in the proximal right upper lobe pulmonary artery in an area of a prior pulmonary embolus consistent with a fibrotic band. No findings suspicious for acute PE. 2. Stable tortuosity and mild ectasia of the thoracic aorta but no aneurysm or dissection. 3. Chronic appearing patchy mosaic pattern of ground-glass attenuation could be asymmetric pulmonary edema, small airways disease such as asthma or respiratory bronchiolitis. Other possibilities would include cryptogenic organizing pneumonia or hypersensitivity  pneumonitis. 4. Small bilateral pleural effusions and overlying atelectasis. 5. 7.5 cm anterior chest wall abscess of uncertain origin. Right sternoclavicular joint septic arthritis would be a possibility although I do not see any definite CT evidence of this. MRI may be helpful for further evaluation. Electronically Signed   By: Marijo Sanes M.D.   On: 02/28/2021 05:47   DG Chest Port 1 View  Result Date:  02/28/2021 CLINICAL DATA:  Shortness of breath for 1 week.  Chest pain. EXAM: PORTABLE CHEST 1 VIEW COMPARISON:  CT 09/21/2020 FINDINGS: Shallow inspiration. Mild cardiac enlargement. No vascular congestion, edema, or consolidation. Prominence of the mediastinum is unchanged since prior study and correlates to a combination of prominent mediastinal fat and lymphadenopathy. No airspace disease or consolidation in the lungs. IMPRESSION: Shallow inspiration. Cardiac enlargement. Prominent mediastinal structures correspond to mediastinal fat and lymphadenopathy on previous CT. No active pulmonary disease. Electronically Signed   By: Lucienne Capers M.D.   On: 02/28/2021 00:38     Labs:   Basic Metabolic Panel: Recent Labs  Lab 03/02/21 0311 03/04/21 0415 03/05/21 0306 03/06/21 0317 03/07/21 0306  NA 129* 137 142 137 130*  K 4.8 2.4* 3.7 2.6* 4.0  CL 94* 101 104 100 96*  CO2 19* $Remov'28 27 25 26  'eHsqgW$ GLUCOSE 98 113* 186* 311* 202*  BUN $Re'17 13 11 9 11  'hCL$ CREATININE 0.78 0.70 0.65 0.68 0.61  CALCIUM 8.6* 7.7* 7.2* 7.0* 7.0*  MG  --  1.6*  --   --  2.5*   GFR Estimated Creatinine Clearance: 75.2 mL/min (by C-G formula based on SCr of 0.61 mg/dL). Liver Function Tests: Recent Labs  Lab 03/05/21 0306 03/06/21 0317  AST 52* 48*  ALT 60* 56*  ALKPHOS 489* 631*  BILITOT 1.9* 2.3*  PROT 4.1* 4.2*  ALBUMIN <1.5* <1.5*   No results for input(s): LIPASE, AMYLASE in the last 168 hours. No results for input(s): AMMONIA in the last 168 hours. Coagulation profile Recent Labs  Lab 03/04/21 0415  INR 1.2    CBC: Recent Labs  Lab 03/02/21 0411 03/04/21 0415 03/05/21 0306 03/06/21 0317  WBC 14.2* 6.7 7.7 8.1  NEUTROABS  --  5.8  --   --   HGB 11.3* 9.3* 9.5* 9.3*  HCT 36.6 29.4* 29.0* 29.5*  MCV 90.8 89.1 86.8 88.9  PLT 30* 6* 8* 12*   Cardiac Enzymes: No results for input(s): CKTOTAL, CKMB, CKMBINDEX, TROPONINI in the last 168 hours. BNP: Invalid input(s): POCBNP CBG: Recent Labs   Lab 03/02/2021 0820 03/01/2021 0851 03/01/2021 1036 03/03/2021 1108 02/16/2021 1206  GLUCAP 46* 98 68* 71 61*   D-Dimer No results for input(s): DDIMER in the last 72 hours. Hgb A1c No results for input(s): HGBA1C in the last 72 hours. Lipid Profile No results for input(s): CHOL, HDL, LDLCALC, TRIG, CHOLHDL, LDLDIRECT in the last 72 hours. Thyroid function studies No results for input(s): TSH, T4TOTAL, T3FREE, THYROIDAB in the last 72 hours.  Invalid input(s): FREET3 Anemia work up No results for input(s): VITAMINB12, FOLATE, FERRITIN, TIBC, IRON, RETICCTPCT in the last 72 hours. Microbiology Recent Results (from the past 240 hour(s))  Resp Panel by RT-PCR (Flu A&B, Covid) Nasopharyngeal Swab     Status: None   Collection Time: 02/28/21  1:55 AM   Specimen: Nasopharyngeal Swab; Nasopharyngeal(NP) swabs in vial transport medium  Result Value Ref Range Status   SARS Coronavirus 2 by RT PCR NEGATIVE NEGATIVE Final    Comment: (NOTE) SARS-CoV-2 target nucleic acids are NOT  DETECTED.  The SARS-CoV-2 RNA is generally detectable in upper respiratory specimens during the acute phase of infection. The lowest concentration of SARS-CoV-2 viral copies this assay can detect is 138 copies/mL. A negative result does not preclude SARS-Cov-2 infection and should not be used as the sole basis for treatment or other patient management decisions. A negative result may occur with  improper specimen collection/handling, submission of specimen other than nasopharyngeal swab, presence of viral mutation(s) within the areas targeted by this assay, and inadequate number of viral copies(<138 copies/mL). A negative result must be combined with clinical observations, patient history, and epidemiological information. The expected result is Negative.  Fact Sheet for Patients:  EntrepreneurPulse.com.au  Fact Sheet for Healthcare Providers:  IncredibleEmployment.be  This  test is no t yet approved or cleared by the [place] FDA and  has been authorized for detection and/or diagnosis of SARS-CoV-2 by FDA under an Emergency Use Authorization (EUA). This EUA will remain  in effect (meaning this test can be used) for the duration of the COVID-19 declaration under Section 564(b)(1) of the Act, 21 U.S.C.section 360bbb-3(b)(1), unless the authorization is terminated  or revoked sooner.       Influenza A by PCR NEGATIVE NEGATIVE Final   Influenza B by PCR NEGATIVE NEGATIVE Final    Comment: (NOTE) The Xpert Xpress SARS-CoV-2/FLU/RSV plus assay is intended as an aid in the diagnosis of influenza from Nasopharyngeal swab specimens and should not be used as a sole basis for treatment. Nasal washings and aspirates are unacceptable for Xpert Xpress SARS-CoV-2/FLU/RSV testing.  Fact Sheet for Patients: EntrepreneurPulse.com.au  Fact Sheet for Healthcare Providers: IncredibleEmployment.be  This test is not yet approved or cleared by the [place] FDA and has been authorized for detection and/or diagnosis of SARS-CoV-2 by FDA under an Emergency Use Authorization (EUA). This EUA will remain in effect (meaning this test can be used) for the duration of the COVID-19 declaration under Section 564(b)(1) of the Act, 21 U.S.C. section 360bbb-3(b)(1), unless the authorization is terminated or revoked.  Performed at [facility], [place] [address], [place], [place] [address]   Culture, blood (routine x 2)     Status: Abnormal   Collection Time: 03/01/21  9:45 AM   Specimen: BLOOD  Result Value Ref Range Status   Specimen Description BLOOD RIGHT ANTECUBITAL  Final   Special Requests   Final    BOTTLES DRAWN AEROBIC ONLY Blood Culture adequate volume   Culture  Setup Time   Final    GRAM POSITIVE COCCI IN CLUSTERS AEROBIC BOTTLE ONLY CRITICAL VALUE NOTED.  VALUE IS CONSISTENT WITH PREVIOUSLY REPORTED AND CALLED  VALUE.    Culture (A)  Final    STAPHYLOCOCCUS AUREUS SUSCEPTIBILITIES PERFORMED ON PREVIOUS CULTURE WITHIN THE LAST 5 DAYS. Performed at [facility], [place] [address] [place], [place] [address]    Report Status 03/04/2021 FINAL  Final  Culture, blood (routine x 2)     Status: Abnormal   Collection Time: 03/01/21  9:59 AM   Specimen: BLOOD  Result Value Ref Range Status   Specimen Description BLOOD LEFT ANTECUBITAL  Final   Special Requests   Final    BOTTLES DRAWN AEROBIC AND ANAEROBIC Blood Culture adequate volume   Culture  Setup Time   Final    GRAM POSITIVE COCCI IN CLUSTERS IN BOTH AEROBIC AND ANAEROBIC BOTTLES CRITICAL RESULT CALLED TO, READ BACK BY AND VERIFIED WITH: V BRYK,PHARMD@0010  03/02/21 [place] Performed at [facility], [address] [place], [place] [address]    Culture METHICILLIN RESISTANT STAPHYLOCOCCUS AUREUS (A)  Final   Report Status 03/04/2021 FINAL  Final   Organism ID, Bacteria METHICILLIN RESISTANT STAPHYLOCOCCUS AUREUS  Final      Susceptibility   Methicillin resistant staphylococcus aureus - MIC*    CIPROFLOXACIN <=0.5 SENSITIVE Sensitive     ERYTHROMYCIN >=8 RESISTANT Resistant     GENTAMICIN <=0.5 SENSITIVE Sensitive     OXACILLIN >=4 RESISTANT Resistant     TETRACYCLINE <=1 SENSITIVE Sensitive     VANCOMYCIN 1 SENSITIVE Sensitive     TRIMETH/SULFA <=10 SENSITIVE Sensitive     CLINDAMYCIN <=0.25 SENSITIVE Sensitive     RIFAMPIN <=0.5 SENSITIVE Sensitive     Inducible Clindamycin NEGATIVE Sensitive     * METHICILLIN RESISTANT STAPHYLOCOCCUS AUREUS  Blood Culture ID Panel (Reflexed)     Status: Abnormal   Collection Time: 03/01/21  9:59 AM  Result Value Ref Range Status   Enterococcus faecalis NOT DETECTED NOT DETECTED Final   Enterococcus Faecium NOT DETECTED NOT DETECTED Final   Listeria monocytogenes NOT DETECTED NOT DETECTED Final   Staphylococcus species DETECTED (A) NOT DETECTED Final    Comment: CRITICAL RESULT CALLED TO, READ  BACK BY AND VERIFIED WITH: V BRYK,PHARMD@0010  03/02/21 [place]    Staphylococcus aureus (BCID) DETECTED (A) NOT DETECTED Final    Comment: Methicillin (oxacillin)-resistant Staphylococcus aureus (MRSA). MRSA is predictably resistant to beta-lactam antibiotics (except ceftaroline). Preferred therapy is vancomycin unless clinically contraindicated. Patient requires contact precautions if  hospitalized. CRITICAL RESULT CALLED TO, READ BACK BY AND VERIFIED WITH: V BRYK,PHARMD@0010  03/02/21 [place]    Staphylococcus epidermidis NOT DETECTED NOT DETECTED Final   Staphylococcus lugdunensis NOT DETECTED NOT DETECTED Final   Streptococcus species NOT DETECTED NOT DETECTED Final   Streptococcus agalactiae NOT DETECTED NOT DETECTED Final   Streptococcus pneumoniae NOT DETECTED NOT DETECTED Final   Streptococcus pyogenes NOT DETECTED NOT DETECTED Final   A.calcoaceticus-baumannii NOT DETECTED NOT DETECTED Final   Bacteroides fragilis NOT DETECTED NOT DETECTED Final   Enterobacterales NOT DETECTED NOT DETECTED Final   Enterobacter cloacae complex NOT DETECTED NOT DETECTED Final   Escherichia coli NOT DETECTED NOT DETECTED Final   Klebsiella aerogenes NOT DETECTED NOT DETECTED Final   Klebsiella oxytoca NOT DETECTED NOT DETECTED Final   Klebsiella pneumoniae NOT DETECTED NOT DETECTED Final   Proteus species NOT DETECTED NOT DETECTED Final   Salmonella species NOT DETECTED NOT DETECTED Final   Serratia marcescens NOT DETECTED NOT DETECTED Final   Haemophilus influenzae NOT DETECTED NOT DETECTED Final   Neisseria meningitidis NOT DETECTED NOT DETECTED Final   Pseudomonas aeruginosa NOT DETECTED NOT DETECTED Final   Stenotrophomonas maltophilia NOT DETECTED NOT DETECTED Final   Candida albicans NOT DETECTED NOT DETECTED Final   Candida auris NOT DETECTED NOT DETECTED Final   Candida glabrata NOT DETECTED NOT DETECTED Final   Candida krusei NOT DETECTED NOT DETECTED Final   Candida parapsilosis NOT DETECTED  NOT DETECTED Final   Candida tropicalis NOT DETECTED NOT DETECTED Final   Cryptococcus neoformans/gattii NOT DETECTED NOT DETECTED Final   Meth resistant mecA/C and MREJ DETECTED (A) NOT DETECTED Final    Comment: CRITICAL RESULT CALLED TO, READ BACK BY AND VERIFIED WITH: V BRYK,PHARMD@0010  03/02/21 [place] Performed at [facility], [address], [place], [place] [address]   Culture, blood (routine x 2)     Status: Abnormal   Collection Time: 03/03/21  4:33 AM   Specimen: BLOOD RIGHT HAND  Result Value Ref Range Status   Specimen Description BLOOD RIGHT HAND  Final   Special Requests   Final    BOTTLES DRAWN AEROBIC ONLY Blood Culture results may not be optimal due to an inadequate volume of blood received in culture bottles   Culture  Setup Time   Final    GRAM POSITIVE COCCI IN CLUSTERS AEROBIC BOTTLE ONLY CRITICAL VALUE NOTED.  VALUE IS CONSISTENT WITH PREVIOUSLY REPORTED AND CALLED VALUE.    Culture (A)  Final    STAPHYLOCOCCUS AUREUS SUSCEPTIBILITIES PERFORMED ON PREVIOUS CULTURE WITHIN THE LAST 5 DAYS. Performed at [facility], [place] [address] [place], [place] [address]    Report Status 03/06/2021 FINAL  Final  Culture, blood (routine x 2)     Status: Abnormal   Collection Time: 03/03/21  4:58 AM   Specimen: BLOOD LEFT HAND  Result Value Ref Range Status   Specimen Description BLOOD LEFT HAND  Final   Special Requests   Final    BOTTLES DRAWN AEROBIC ONLY Blood Culture results may not be optimal due to an inadequate volume of blood received in culture bottles   Culture  Setup Time   Final    GRAM POSITIVE COCCI IN CLUSTERS AEROBIC BOTTLE ONLY CRITICAL VALUE NOTED.  VALUE IS CONSISTENT WITH PREVIOUSLY REPORTED AND CALLED VALUE.    Culture (A)  Final    STAPHYLOCOCCUS AUREUS SUSCEPTIBILITIES PERFORMED ON PREVIOUS CULTURE WITHIN THE LAST 5 DAYS. Performed at [facility],  [address], [place], [place] [address]    Report Status 03/06/2021 FINAL  Final   MRSA Next Gen by PCR, Nasal     Status: Abnormal   Collection Time: 03/04/21 11:27 AM   Specimen: Nasal Mucosa; Nasal Swab  Result Value Ref Range Status   MRSA by PCR Next Gen DETECTED (A) NOT DETECTED Final    Comment: RESULT CALLED TO, READ BACK BY AND VERIFIED WITH: RN S MIGANTOS [id number] AT 1311 BY CM (NOTE) The GeneXpert MRSA Assay (FDA approved for NASAL specimens only), is one component of a comprehensive MRSA colonization surveillance program. It is not intended to diagnose MRSA infection nor to guide or monitor treatment for MRSA infections. Test performance is not FDA approved in patients less than 61 years old. Performed at [facility], [place] [address], [place], [place] [address]   Aerobic/Anaerobic Culture w Gram Stain (surgical/deep wound)     Status: None (Preliminary result)   Collection Time: 03/04/21  5:20 PM   Specimen: Abscess  Result Value Ref Range Status   Specimen Description ABSCESS RIGHT NECK  Final   Special Requests NONE  Final   Gram Stain   Final    ABUNDANT WBC PRESENT, PREDOMINANTLY MONONUCLEAR MODERATE GRAM POSITIVE COCCI IN PAIRS AND CHAINS FEW GRAM POSITIVE COCCI IN CLUSTERS Performed at [facility], [place] [address], [place], [place] [address]    Culture   Final    ABUNDANT METHICILLIN RESISTANT STAPHYLOCOCCUS AUREUS NO ANAEROBES ISOLATED; CULTURE IN PROGRESS FOR 5 DAYS    Report Status PENDING  Incomplete   Organism ID, Bacteria METHICILLIN RESISTANT STAPHYLOCOCCUS AUREUS  Final      Susceptibility   Methicillin resistant staphylococcus aureus - MIC*    CIPROFLOXACIN <=0.5 SENSITIVE Sensitive     ERYTHROMYCIN >=8 RESISTANT Resistant     GENTAMICIN <=0.5 SENSITIVE Sensitive     OXACILLIN >=4 RESISTANT Resistant     TETRACYCLINE <=1 SENSITIVE Sensitive     VANCOMYCIN <=0.5 SENSITIVE  Sensitive     TRIMETH/SULFA <=10 SENSITIVE Sensitive     CLINDAMYCIN <=0.25 SENSITIVE Sensitive     RIFAMPIN <=0.5 SENSITIVE Sensitive      Inducible Clindamycin NEGATIVE Sensitive     * ABUNDANT METHICILLIN RESISTANT STAPHYLOCOCCUS AUREUS     Discharge Instructions:   Discharge Instructions     Discharge instructions   Complete by: As directed    DYS 1-- medications whole with puree   Discharge wound care:   Complete by: As directed    Apply Santyl to the sacral wound then cover with moistened saline gauze, dry gauze and foam dressing. Silicone foam dressings to the distal midline wound; change every 3 days. Assess under dressings each shift for any acute changes in the wounds. Saline moist gauze dressing to the larger abdominal wound, top with ABD pads, secure with tape.Change BID   Increase activity slowly   Complete by: As directed       Allergies as of 03/13/2021       Reactions   Aspirin Swelling   Augmentin [amoxicillin-pot Clavulanate] Swelling, Other (See Comments)   **patient has tolerated piperacillin/tazobactam, cephalexin, and ceftriaxone Hand swelled at IV site - likely extravasation Also Tolerated Unasyn 02/2021        Medication List     STOP taking these medications    apixaban 5 MG Tabs tablet Commonly known as: ELIQUIS   atorvastatin 80 MG tablet Commonly known as: LIPITOR   b complex vitamins tablet   CALCIUM+D3 PO   Fish Oil 1000 MG Caps   glucosamine-chondroitin 500-400 MG tablet   silver sulfADIAZINE 1 % cream Commonly known as: SILVADENE       TAKE these medications    acetaminophen 500 MG tablet Commonly known as: TYLENOL Take 1,000 mg by mouth every 6 (six) hours as needed for moderate pain or headache.   blood glucose meter kit and supplies Kit Dispense based on patient and insurance preference. Use up to four times daily as directed.   doxycycline 100 MG tablet Commonly known as: VIBRA-TABS Take 1 tablet (100 mg total) by mouth every 12 (twelve) hours.   Ensure Max Protein Liqd Take 330 mLs (11 oz total) by mouth at bedtime. What changed:  when to take  this reasons to take this   FeroSul 325 (65 FE) MG tablet Generic drug: ferrous sulfate Take 325 mg by mouth daily with breakfast.   gabapentin 100 MG capsule Commonly known as: NEURONTIN Take 100 mg by mouth 3 (three) times daily.   insulin aspart 100 UNIT/ML FlexPen Commonly known as: NOVOLOG Inject 1-20 Units into the skin 3 (three) times daily with meals. CBG 70 - 120: 0 units  CBG 121 - 150: 3 units  CBG 151 - 200: 4 units  CBG 201 - 250: 7 units  CBG 251 - 300: 11 units  CBG 301 - 350: 15 units  CBG 351 - 400: 20 units   insulin glargine 100 UNIT/ML injection Commonly known as: LANTUS Inject 0.16 mLs (16 Units total) into the skin at bedtime. What changed: how much to take   Insulin Pen Needle 29G X 5MM Misc USE WITH LANTUS   Melatonin 10 MG Tabs Take 10 mg by mouth at bedtime.   metoprolol tartrate 25 MG tablet Commonly known as: LOPRESSOR Take 25 mg by mouth 2 (two) times daily.   morphine CONCENTRATE 10 mg / 0.5 ml concentrated solution Take 0.25 mLs (5 mg total) by mouth every 2 (two) hours as needed  for severe pain. Or air hunger or hospice care   multivitamin with minerals Tabs tablet Take 1 tablet by mouth daily with lunch.   ondansetron 4 MG disintegrating tablet Commonly known as: Zofran ODT Take 1 tablet (4 mg total) by mouth every 8 (eight) hours as needed for nausea or vomiting.   pantoprazole 40 MG tablet Commonly known as: PROTONIX Take 1 tablet (40 mg total) by mouth daily.   polyethylene glycol 17 g packet Commonly known as: MIRALAX / GLYCOLAX Take 17 g by mouth 2 (two) times daily. What changed:  when to take this reasons to take this   predniSONE 20 MG tablet Commonly known as: DELTASONE Take 40 mg by mouth 2 (two) times daily with a meal.   Refresh 1.4-0.6 % Soln Generic drug: Polyvinyl Alcohol-Povidone PF Place 1 drop into both eyes daily as needed (dry eyes).   Santyl ointment Generic drug: collagenase Apply topically 2  (two) times daily.               Discharge Care Instructions  (From admission, onward)           Start     Ordered   03/07/21 0000  Discharge wound care:       Comments: Apply Santyl to the sacral wound then cover with moistened saline gauze, dry gauze and foam dressing. Silicone foam dressings to the distal midline wound; change every 3 days. Assess under dressings each shift for any acute changes in the wounds. Saline moist gauze dressing to the larger abdominal wound, top with ABD pads, secure with tape.Change BID   03/07/21 1515              Time coordinating discharge: 35 min  Signed:  Geradine Girt DO  [facility] 03/01/2021, 4:02 PM

## 2021-03-08 NOTE — Progress Notes (Signed)
Inpatient Diabetes Program Recommendations  AACE/ADA: New Consensus Statement on Inpatient Glycemic Control (2015)  Target Ranges:  Prepandial:   less than 140 mg/dL      Peak postprandial:   less than 180 mg/dL (1-2 hours)      Critically ill patients:  140 - 180 mg/dL  Results for Rhonda [name], Rhonda [name] (MRN [id number]) as of 03/15/2021 09:23  Ref. Range 03/07/2021 07:51 03/07/2021 10:29 03/07/2021 11:59 03/07/2021 17:14 03/07/2021 20:37 03/07/2021 21:46  Glucose-Capillary Latest Ref Range: 70 - 99 mg/dL 184 (H) 184 (H)  3 units Novolog  184 (H)  3 units Novolog @1408  189 (H)  3 units Novolog  167 (H) 140 (H)    16 units Semglee  Results for Rhonda [name], Rhonda [name] (MRN [id number]) as of 03/03/2021 09:23  Ref. Range 03/09/2021 08:20  Glucose-Capillary Latest Ref Range: 70 - 99 mg/dL 46 (L)    Home DM Meds: Novolog 1-20 units tid with meals Lantus 25-28 units bid  Current Orders: Semglee 16 units QHS Novolog Moderate Correction Scale/ SSI (0-15 units) TID AC + HS     MD- Note Solucortef stopped--last dose given 4am 10/20.  Now getting Prednisone 40 mg daily.  Severe HYPO this AM (CBG 46)  Please consider reducing the Semglee to 8 units QHS (50% reduction)    --Will follow patient during hospitalization--  Wyn Quaker RN, MSN, CDE Diabetes Coordinator Inpatient Glycemic Control Team Team Pager: [phone] (8a-5p)

## 2021-03-08 NOTE — TOC Transition Note (Signed)
Transition of Care [facility]) - CM/SW Discharge Note   Patient Details  Name: Rhonda [name] MRN: [id number] Date of Birth: October 21, 1960  [facility] CM/SW Contact:  Joanne Chars, LCSW Phone Number: 03/10/2021, 10:35 AM   Clinical Narrative:   Pt discharging home with [facility].  PTAR to transport.    Final next level of care: Home w Hospice Care Barriers to Discharge: Barriers Resolved   Patient Goals and CMS Choice Patient states their goals for this hospitalization and ongoing recovery are:: comfort CMS Medicare.gov Compare Post Acute Care list provided to:: Patient Represenative (must comment) Choice offered to / list presented to : Spouse  Discharge Placement                  Name of family member notified: husband [name] Patient and family notified of of transfer: 02/19/2021  Discharge Plan and Services     Post Acute Care Choice: Hospice                               Social Determinants of Health (SDOH) Interventions     Readmission Risk Interventions Readmission Risk Prevention Plan 11/26/2020 09/12/2020 09/04/2020  Transportation Screening Complete Complete Complete  Medication Review [employer]) Complete - Referral to Pharmacy  PCP or Specialist appointment within 3-5 days of discharge Complete Complete Complete  HRI or Home Care Consult Complete Complete Complete  SW Recovery Care/Counseling Consult Complete Complete Complete  Palliative Care Screening Not Applicable Not Applicable Not Applicable  Skilled Nursing Facility Not Applicable Complete Complete  Some recent data might be hidden

## 2021-03-08 NOTE — Progress Notes (Signed)
Patient is being discharged to home with hospice via PTAR report given to [facility] EMT. Per [facility] pharmacy meds given to transport staff (including morphine). Pt has been drowsy all morning no significant changes in status. No personal belongings left at bedside.   Vitals:   02/26/2021 0607 03/09/2021 1300  BP: (!) 120/108 125/83  Pulse: 81 72  Resp: 17 15  Temp: 98 F (36.7 C)   SpO2: 90% 97%

## 2021-03-10 LAB — AEROBIC/ANAEROBIC CULTURE W GRAM STAIN (SURGICAL/DEEP WOUND)

## 2021-03-19 DEATH — deceased

## 2021-03-21 ENCOUNTER — Other Ambulatory Visit (HOSPITAL_COMMUNITY): Payer: Self-pay

## 2022-11-06 IMAGING — CT CT ABD-PELV W/ CM
3 of 5 series · 11 of 46 positions shown, 16 images · IV contrast (APPLIED)
Comparison: CT 11/19/2019

CLINICAL DATA: Bloody stools, abdominal pain

EXAM:
CT ABDOMEN AND PELVIS WITH CONTRAST
TECHNIQUE: Multidetector CT imaging of the abdomen and pelvis was performed
using the standard protocol following bolus administration of
intravenous contrast.
CONTRAST:  100mL OMNIPAQUE IOHEXOL 300 MG/ML  SOLN

[Series 3: abdomen 5.0 · axial · 0.76mm/px · z∈[+1094,+1439]mm · 7 of 93 slices shown, 12 images]
[im 12/93  soft-tissue]
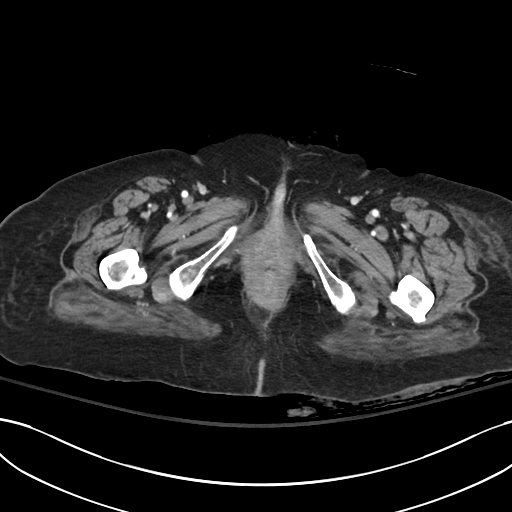
[im 12/93  bone]
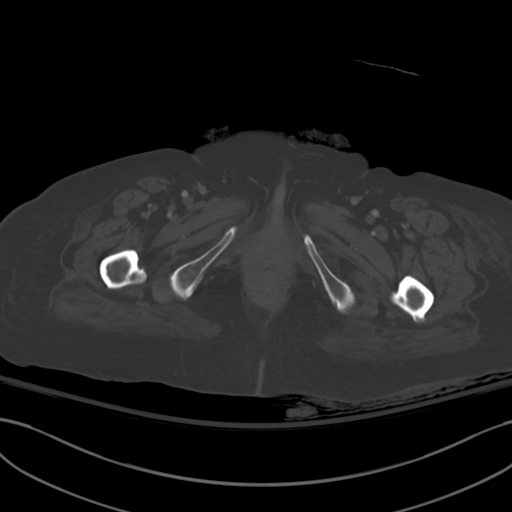
[im 24/93  soft-tissue]
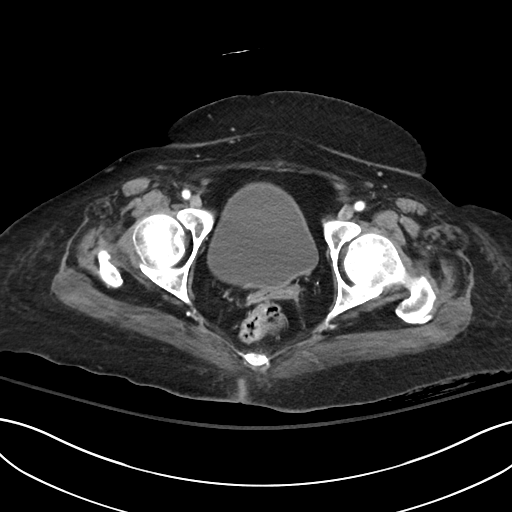
[im 35/93  soft-tissue]
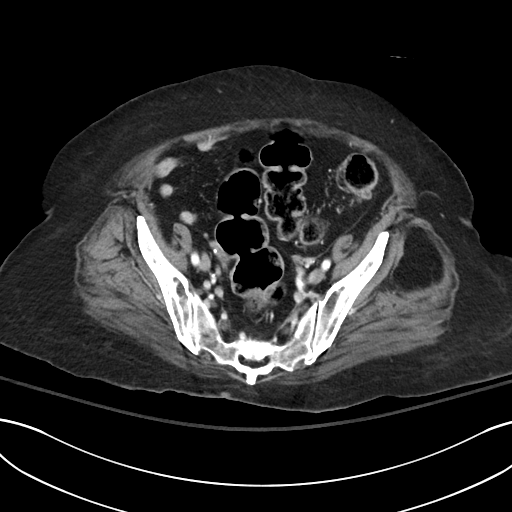
[im 47/93  soft-tissue]
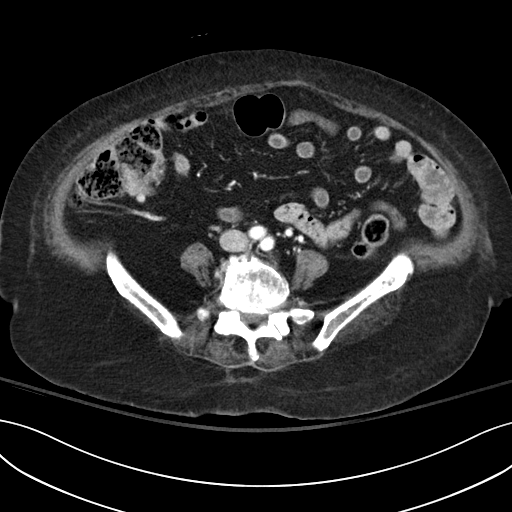
[im 47/93  lung]
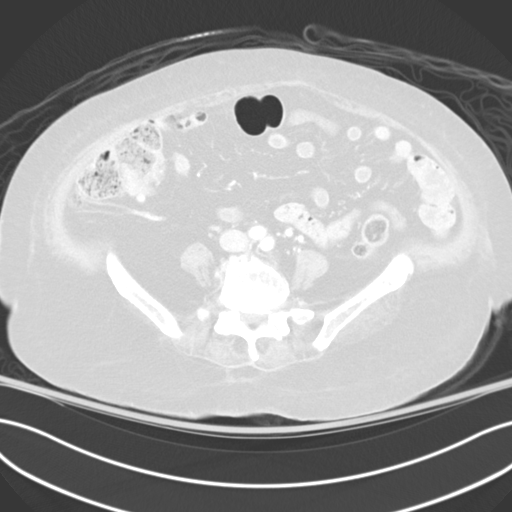
[im 58/93  soft-tissue]
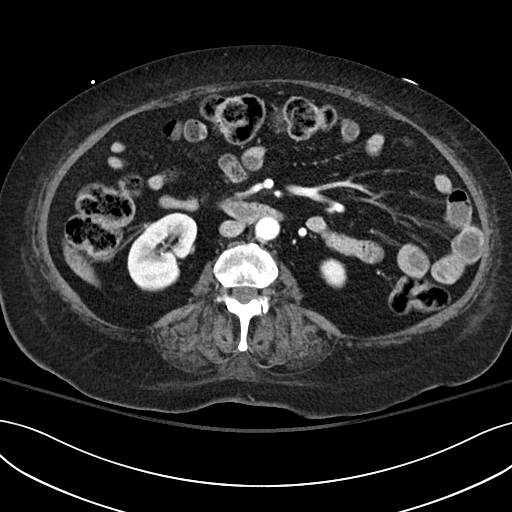
[im 58/93  lung]
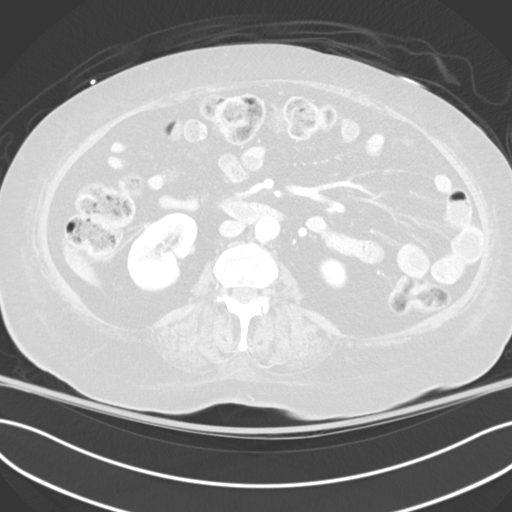
[im 70/93  soft-tissue]
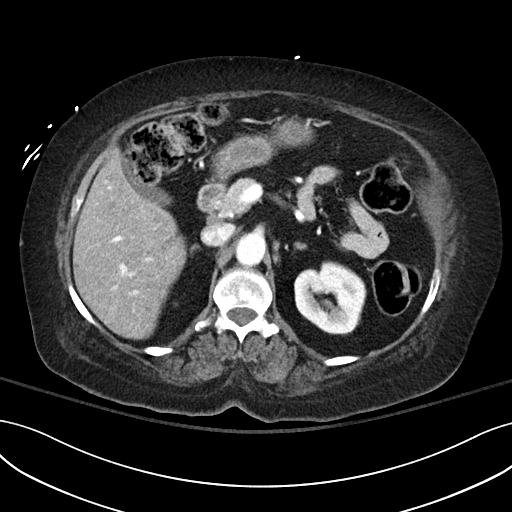
[im 70/93  lung]
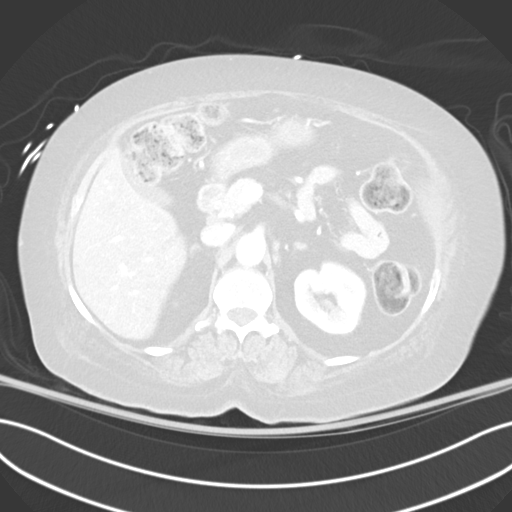
[im 81/93  soft-tissue]
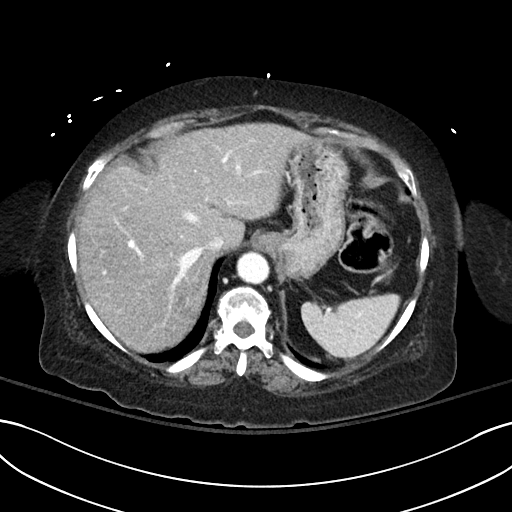
[im 81/93  lung]
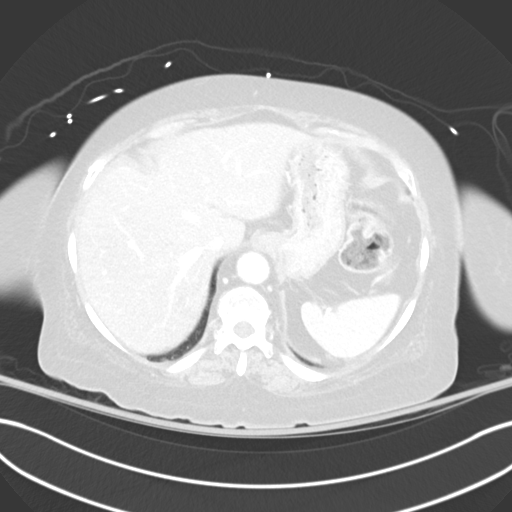

[Series 6: abdomen 3.0 mpr cor · coronal · 0.90mm/px · 3 of 96 slices shown]
[im 32/96  soft-tissue]
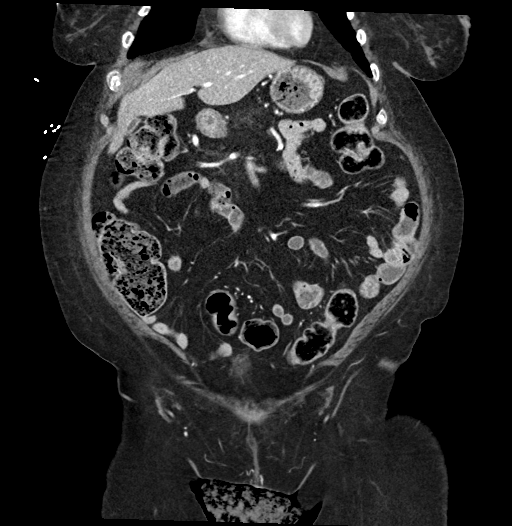
[im 43/96  soft-tissue]
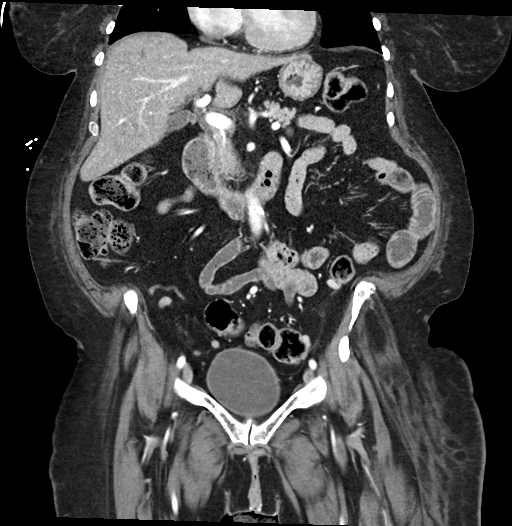
[im 53/96  soft-tissue]
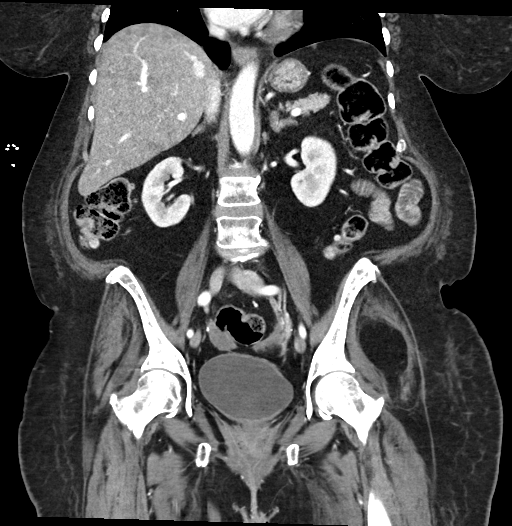

[Series 7: abdomen 3.0 mpr sag · sagittal · 0.56mm/px · 1 of 141 slices shown]
[im 47/141  soft-tissue]
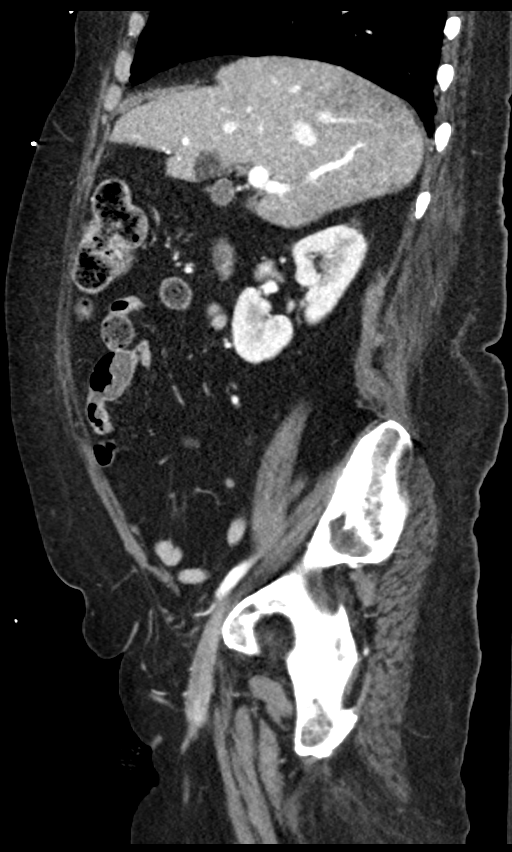

[11 of 46 positions shown; findings below may reference images not displayed]

FINDINGS: Lower chest: Lung bases are clear. Normal heart size. No pericardial
effusion.

Hepatobiliary: Diffuse hepatic hypoattenuation compatible with
hepatic steatosis. Hypoattenuating focus along the posterior aspect
segment 4 near the falciform ligament and gallbladder fossa
measuring approximately 2 cm in size ([DATE]) possible cyst versus
hemangioma, incompletely characterized on this exam, less readily
apparent on comparison given some motion artifact in this vicinity.
No concerning focal liver lesion is seen. Gallbladder is largely
decompressed with otherwise normal appearance of the gallbladder and
biliary tree.

Pancreas: Question some faint hazy stranding about the pancreatic
head and neck (6/34. Uniform enhancement of the pancreatic
parenchyma. No biliary ductal dilatation.

Spleen: Normal in size. No concerning splenic lesions.

Adrenals/Urinary Tract: Normal adrenal glands. No concerning adrenal
lesions. Kidneys are normally located with symmetric enhancement and
excretion. Subcentimeter hypertension focus in the interpolar left
kidney, too small to fully characterize on CT imaging but
statistically likely benign. No suspicious renal lesion,
urolithiasis or hydronephrosis. Urinary bladder is unremarkable for
the degree of distention.

Stomach/Bowel: Distal esophagus, stomach and duodenum are free of
acute abnormality. No small bowel thickening or dilatation. A normal
appendix is visualized. No colonic dilatation or wall thickening.
Scattered pancolonic diverticula. Some focal stranding and mild
mural thickening is noted in the proximal sigmoid colon centered
upon a possible culprit diverticulum (7/44).

Vascular/Lymphatic: Atherosclerotic calcifications within the
abdominal aorta and branch vessels. No aneurysm or ectasia. No
enlarged abdominopelvic lymph nodes.

Reproductive: Uterus is surgically absent. No concerning adnexal
lesions. Quiescent appearance of the retained ovarian parenchyma.

Other: Hazy stranding adjacent the pancreatic head and proximal
body. Additional mild stranding and thickening adjacent the proximal
sigmoid colon. No free air, fluid, abscess or collection is seen in
the abdomen or pelvis. No bowel containing hernias. Mild body wall
edema.

Musculoskeletal: Lobular fat attenuation lesion within the gluteus
medius on the left measuring approximately 5.1 x 3.9 cm, previously
5.1 x 2.5 cm. Favor a slow growing lipoma in the absence of more
aggressive features.

The osseous structures appear diffusely demineralized which may
limit detection of small or nondisplaced fractures. No acute osseous
abnormality or suspicious osseous lesion. Grade 1 anterolisthesis L4
on L5. No associated spondylolysis. Multilevel discogenic and facet
degenerative changes present throughout the lumbar levels some
moderate to severe canal stenosis at the L4-5, L5-S1 levels as well
as moderate to severe foraminal impingement.
IMPRESSION: 1. Pancolonic diverticulosis with slightly more focal stranding and
thickening adjacent a culprit diverticulum in the proximal sigmoid.
No extraluminal gas, abscess or collection. Findings consistent with
an acute uncomplicated diverticulitis.
2. Additional faint mild stranding adjacent the pancreatic head and
neck. Correlate with lipase to assess for pancreatitis.
3. Hepatic steatosis.
4. Hypoattenuating focus along the posterior aspect segment 4 near
the falciform ligament and gallbladder fossa measuring approximately
2 cm in size, incompletely characterized on this exam, possible cyst
versus hemangioma, less readily apparent on comparison given some
motion artifact in this vicinity. Could consider assessment with
right upper quadrant ultrasound on a nonemergent, outpatient basis.
5. Lobular fat attenuation lesion within the left gluteus medius on
the left measuring approximately 5.1 x 3.9 cm, previously 5.1 x
cm. Favor a slow growing lipoma in the absence of more aggressive
features. Recommend close clinical follow-up with repeat imaging as
warranted or if new symptoms develop.
6. Multilevel discogenic and facet degenerative changes throughout
the lumbar levels with some moderate to severe canal stenosis and
foraminal impingement at the L4-5, L5-S1 levels. Grade 1
anterolisthesis L4 on L5 as well.
7. Aortic Atherosclerosis ([id number]).
